# Patient Record
Sex: Male | Born: 1945 | Race: Black or African American | Hispanic: No | Marital: Single | Smoking: Former smoker
Health system: Southern US, Community
[De-identification: ages and names within clinical notes are randomized; demographics above are authoritative.]

## PROBLEM LIST (undated history)

## (undated) DIAGNOSIS — E785 Hyperlipidemia, unspecified: Secondary | ICD-10-CM

## (undated) DIAGNOSIS — G4730 Sleep apnea, unspecified: Secondary | ICD-10-CM

## (undated) DIAGNOSIS — L03119 Cellulitis of unspecified part of limb: Secondary | ICD-10-CM

## (undated) DIAGNOSIS — J9610 Chronic respiratory failure, unspecified whether with hypoxia or hypercapnia: Secondary | ICD-10-CM

## (undated) DIAGNOSIS — F17201 Nicotine dependence, unspecified, in remission: Secondary | ICD-10-CM

## (undated) DIAGNOSIS — Z9981 Dependence on supplemental oxygen: Secondary | ICD-10-CM

## (undated) DIAGNOSIS — E669 Obesity, unspecified: Secondary | ICD-10-CM

## (undated) DIAGNOSIS — E230 Hypopituitarism: Secondary | ICD-10-CM

## (undated) DIAGNOSIS — J449 Chronic obstructive pulmonary disease, unspecified: Secondary | ICD-10-CM

## (undated) DIAGNOSIS — B3781 Candidal esophagitis: Secondary | ICD-10-CM

## (undated) DIAGNOSIS — G40909 Epilepsy, unspecified, not intractable, without status epilepticus: Secondary | ICD-10-CM

## (undated) DIAGNOSIS — I5022 Chronic systolic (congestive) heart failure: Secondary | ICD-10-CM

## (undated) DIAGNOSIS — N184 Chronic kidney disease, stage 4 (severe): Secondary | ICD-10-CM

## (undated) DIAGNOSIS — K922 Gastrointestinal hemorrhage, unspecified: Secondary | ICD-10-CM

## (undated) DIAGNOSIS — I10 Essential (primary) hypertension: Secondary | ICD-10-CM

## (undated) DIAGNOSIS — E119 Type 2 diabetes mellitus without complications: Secondary | ICD-10-CM

## (undated) DIAGNOSIS — I351 Nonrheumatic aortic (valve) insufficiency: Secondary | ICD-10-CM

## (undated) DIAGNOSIS — I272 Pulmonary hypertension, unspecified: Secondary | ICD-10-CM

## (undated) DIAGNOSIS — I4510 Unspecified right bundle-branch block: Secondary | ICD-10-CM

## (undated) DIAGNOSIS — E039 Hypothyroidism, unspecified: Secondary | ICD-10-CM

## (undated) HISTORY — DX: Gastrointestinal hemorrhage, unspecified: K92.2

## (undated) HISTORY — PX: CRANIOTOMY: SHX93

## (undated) HISTORY — DX: Chronic obstructive pulmonary disease, unspecified: J44.9

## (undated) HISTORY — PX: WOUND EXPLORATION: SHX2669

## (undated) HISTORY — DX: Hypopituitarism: E23.0

## (undated) HISTORY — DX: Hyperlipidemia, unspecified: E78.5

## (undated) HISTORY — DX: Nicotine dependence, unspecified, in remission: F17.201

## (undated) HISTORY — DX: Epilepsy, unspecified, not intractable, without status epilepticus: G40.909

---

## 1998-04-09 ENCOUNTER — Ambulatory Visit (HOSPITAL_COMMUNITY): Admission: RE | Admit: 1998-04-09 | Discharge: 1998-04-09 | Payer: Self-pay | Admitting: Neurosurgery

## 1999-04-16 ENCOUNTER — Encounter: Payer: Self-pay | Admitting: Neurosurgery

## 1999-04-16 ENCOUNTER — Ambulatory Visit (HOSPITAL_COMMUNITY): Admission: RE | Admit: 1999-04-16 | Discharge: 1999-04-16 | Payer: Self-pay | Admitting: Neurosurgery

## 2001-03-13 ENCOUNTER — Encounter: Payer: Self-pay | Admitting: Neurosurgery

## 2001-03-13 ENCOUNTER — Ambulatory Visit (HOSPITAL_COMMUNITY): Admission: RE | Admit: 2001-03-13 | Discharge: 2001-03-13 | Payer: Self-pay | Admitting: Neurosurgery

## 2001-11-23 ENCOUNTER — Encounter: Payer: Self-pay | Admitting: Internal Medicine

## 2001-11-23 ENCOUNTER — Ambulatory Visit (HOSPITAL_COMMUNITY): Admission: RE | Admit: 2001-11-23 | Discharge: 2001-11-23 | Payer: Self-pay | Admitting: Internal Medicine

## 2001-11-26 ENCOUNTER — Ambulatory Visit (HOSPITAL_COMMUNITY): Admission: RE | Admit: 2001-11-26 | Discharge: 2001-11-26 | Payer: Self-pay | Admitting: Internal Medicine

## 2001-11-26 ENCOUNTER — Encounter: Payer: Self-pay | Admitting: Internal Medicine

## 2002-06-20 ENCOUNTER — Other Ambulatory Visit: Admission: RE | Admit: 2002-06-20 | Discharge: 2002-06-20 | Payer: Self-pay | Admitting: Dermatology

## 2003-04-20 ENCOUNTER — Encounter: Payer: Self-pay | Admitting: Internal Medicine

## 2003-04-20 ENCOUNTER — Ambulatory Visit (HOSPITAL_COMMUNITY): Admission: RE | Admit: 2003-04-20 | Discharge: 2003-04-20 | Payer: Self-pay | Admitting: Internal Medicine

## 2007-08-09 HISTORY — PX: COLONOSCOPY: SHX174

## 2007-08-13 ENCOUNTER — Ambulatory Visit (HOSPITAL_COMMUNITY): Admission: RE | Admit: 2007-08-13 | Discharge: 2007-08-13 | Payer: Self-pay | Admitting: Internal Medicine

## 2007-08-13 ENCOUNTER — Ambulatory Visit: Payer: Self-pay | Admitting: Internal Medicine

## 2008-02-02 ENCOUNTER — Ambulatory Visit: Payer: Self-pay | Admitting: Internal Medicine

## 2008-02-02 ENCOUNTER — Ambulatory Visit (HOSPITAL_COMMUNITY): Admission: RE | Admit: 2008-02-02 | Discharge: 2008-02-02 | Payer: Self-pay | Admitting: Internal Medicine

## 2008-12-08 HISTORY — PX: NEPHRECTOMY: SHX65

## 2008-12-28 ENCOUNTER — Ambulatory Visit (HOSPITAL_COMMUNITY): Admission: RE | Admit: 2008-12-28 | Discharge: 2008-12-28 | Payer: Self-pay | Admitting: Internal Medicine

## 2009-01-08 ENCOUNTER — Ambulatory Visit (HOSPITAL_COMMUNITY): Admission: RE | Admit: 2009-01-08 | Discharge: 2009-01-08 | Payer: Self-pay | Admitting: Urology

## 2010-05-20 ENCOUNTER — Emergency Department (HOSPITAL_COMMUNITY): Admission: EM | Admit: 2010-05-20 | Discharge: 2010-05-20 | Payer: Self-pay | Admitting: Emergency Medicine

## 2010-12-21 ENCOUNTER — Inpatient Hospital Stay (HOSPITAL_COMMUNITY)
Admission: EM | Admit: 2010-12-21 | Discharge: 2010-12-25 | Payer: Self-pay | Source: Home / Self Care | Attending: Internal Medicine | Admitting: Internal Medicine

## 2010-12-23 LAB — BASIC METABOLIC PANEL
BUN: 10 mg/dL (ref 6–23)
BUN: 17 mg/dL (ref 6–23)
CO2: 26 mEq/L (ref 19–32)
CO2: 23 mEq/L (ref 19–32)
Calcium: 8.8 mg/dL (ref 8.4–10.5)
Calcium: 8.8 mg/dL (ref 8.4–10.5)
Chloride: 103 mEq/L (ref 96–112)
Chloride: 106 mEq/L (ref 96–112)
Creatinine, Ser: 1.62 mg/dL — ABNORMAL HIGH (ref 0.4–1.5)
Creatinine, Ser: 1.58 mg/dL — ABNORMAL HIGH (ref 0.4–1.5)
GFR calc Af Amer: 52 mL/min — ABNORMAL LOW (ref >60)
GFR calc Af Amer: 53 mL/min — ABNORMAL LOW (ref >60)
GFR calc non Af Amer: 43 mL/min — ABNORMAL LOW (ref >60)
GFR calc non Af Amer: 44 mL/min — ABNORMAL LOW (ref >60)
Glucose, Bld: 112 mg/dL — ABNORMAL HIGH (ref 70–99)
Glucose, Bld: 135 mg/dL — ABNORMAL HIGH (ref 70–99)
Potassium: 4.1 mEq/L (ref 3.5–5.1)
Potassium: 5.5 mEq/L — ABNORMAL HIGH (ref 3.5–5.1)
Sodium: 137 mEq/L (ref 135–145)
Sodium: 139 mEq/L (ref 135–145)

## 2010-12-23 LAB — DIFFERENTIAL
Basophils Absolute: 0 10*3/uL (ref 0.0–0.1)
Basophils Relative: 0 % (ref 0–1)
Eosinophils Absolute: 0.5 10*3/uL (ref 0.0–0.7)
Eosinophils Relative: 7 % — ABNORMAL HIGH (ref 0–5)
Lymphocytes Relative: 14 % (ref 12–46)
Lymphs Abs: 1 10*3/uL (ref 0.7–4.0)
Monocytes Absolute: 1 10*3/uL (ref 0.1–1.0)
Monocytes Relative: 11 % (ref 3–12)
Neutro Abs: 4.9 10*3/uL (ref 1.7–7.7)
Neutrophils Relative %: 68 % (ref 43–77)

## 2010-12-23 LAB — CBC
HCT: 44.6 % (ref 39.0–52.0)
Hemoglobin: 14.7 g/dL (ref 13.0–17.0)
MCH: 29 pg (ref 26.0–34.0)
MCHC: 33 g/dL (ref 30.0–36.0)
MCV: 88 fL (ref 78.0–100.0)
Platelets: 158 10*3/uL (ref 150–400)
RBC: 5.07 MIL/uL (ref 4.22–5.81)
RDW: 15.3 % (ref 11.5–15.5)
WBC: 7 10*3/uL (ref 4.0–10.5)

## 2010-12-23 LAB — MAGNESIUM: Magnesium: 1.9 mg/dL (ref 1.5–2.5)

## 2010-12-23 LAB — TSH: TSH: 0.993 u[IU]/mL (ref 0.350–4.500)

## 2010-12-25 LAB — CBC
HCT: 43 % (ref 39.0–52.0)
Hemoglobin: 13.8 g/dL (ref 13.0–17.0)
MCH: 28.5 pg (ref 26.0–34.0)
MCHC: 32.1 g/dL (ref 30.0–36.0)
MCV: 88.8 fL (ref 78.0–100.0)
Platelets: 168 10*3/uL (ref 150–400)
RBC: 4.84 MIL/uL (ref 4.22–5.81)
RDW: 15.2 % (ref 11.5–15.5)
WBC: 12.2 10*3/uL — ABNORMAL HIGH (ref 4.0–10.5)

## 2010-12-25 LAB — DIFFERENTIAL
Basophils Absolute: 0 10*3/uL (ref 0.0–0.1)
Basophils Relative: 0 % (ref 0–1)
Eosinophils Absolute: 0 10*3/uL (ref 0.0–0.7)
Eosinophils Relative: 0 % (ref 0–5)
Lymphocytes Relative: 5 % — ABNORMAL LOW (ref 12–46)
Lymphs Abs: 0.6 10*3/uL — ABNORMAL LOW (ref 0.7–4.0)
Monocytes Absolute: 0.6 10*3/uL (ref 0.1–1.0)
Monocytes Relative: 5 % (ref 3–12)
Neutro Abs: 11 10*3/uL — ABNORMAL HIGH (ref 1.7–7.7)
Neutrophils Relative %: 90 % — ABNORMAL HIGH (ref 43–77)

## 2010-12-25 LAB — BASIC METABOLIC PANEL
BUN: 26 mg/dL — ABNORMAL HIGH (ref 6–23)
BUN: 30 mg/dL — ABNORMAL HIGH (ref 6–23)
CO2: 23 mEq/L (ref 19–32)
CO2: 23 mEq/L (ref 19–32)
Calcium: 8.5 mg/dL (ref 8.4–10.5)
Calcium: 8.8 mg/dL (ref 8.4–10.5)
Chloride: 110 mEq/L (ref 96–112)
Chloride: 109 mEq/L (ref 96–112)
Creatinine, Ser: 1.71 mg/dL — ABNORMAL HIGH (ref 0.4–1.5)
Creatinine, Ser: 1.61 mg/dL — ABNORMAL HIGH (ref 0.4–1.5)
GFR calc Af Amer: 49 mL/min — ABNORMAL LOW (ref >60)
GFR calc Af Amer: 52 mL/min — ABNORMAL LOW (ref >60)
GFR calc non Af Amer: 40 mL/min — ABNORMAL LOW (ref >60)
GFR calc non Af Amer: 43 mL/min — ABNORMAL LOW (ref >60)
Glucose, Bld: 160 mg/dL — ABNORMAL HIGH (ref 70–99)
Glucose, Bld: 100 mg/dL — ABNORMAL HIGH (ref 70–99)
Potassium: 5.1 mEq/L (ref 3.5–5.1)
Potassium: 4.9 mEq/L (ref 3.5–5.1)
Sodium: 141 mEq/L (ref 135–145)
Sodium: 138 mEq/L (ref 135–145)

## 2010-12-29 ENCOUNTER — Encounter: Payer: Self-pay | Admitting: Urology

## 2011-01-01 ENCOUNTER — Ambulatory Visit
Admission: RE | Admit: 2011-01-01 | Discharge: 2011-01-01 | Payer: Self-pay | Source: Home / Self Care | Attending: Pulmonary Disease | Admitting: Pulmonary Disease

## 2011-01-04 NOTE — Progress Notes (Signed)
  NAME:  [name] [name], [name] [name]                  ACCOUNT NO.:  [id number]  MEDICAL RECORD NO.:  [id number]          PATIENT TYPE:  INP  LOCATION:  A316                          FACILITY:  [facility]  PHYSICIAN:  Stefany Starace L. Juanetta Gosling, M.D.DATE OF BIRTH:  1946-07-14  DATE OF PROCEDURE: DATE OF DISCHARGE:                                PROGRESS NOTE   Patient of Dr. Felecia Shelling.  Ms. Milligan is apparently being discharged today.  I have discussed with the care management group, and he is going to have an outpatient sleep study.     Demba Nigh L. Juanetta Gosling, M.D.     ELH/MEDQ  D:  12/25/2010  T:  12/26/2010  Job:  [id number]  Electronically Signed by Kari Baars M.D. on 01/04/2011 10:50:54 AM

## 2011-01-04 NOTE — Progress Notes (Signed)
  NAME:  [name], [name]                  ACCOUNT NO.:  [id number]  MEDICAL RECORD NO.:  [id number]          PATIENT TYPE:  INP  LOCATION:  A316                          FACILITY:  [facility]  PHYSICIAN:  Edward L. Juanetta Gosling, M.D.DATE OF BIRTH:  1946-02-06  DATE OF PROCEDURE: DATE OF DISCHARGE:                                PROGRESS NOTE   [name] is being set up for an outpatient sleep study.  [name] admitted with bronchitis.  PHYSICAL EXAMINATION:  VITAL SIGNS:  Exam today shows his temperature is 98.4, pulse 81, respirations 20, blood pressure 162/90, O2 sats 95% on 2 liters. GENERAL:  Overall he looks better.  ASSESSMENT:  [name] improving.  PLAN:  I think he has sleep apnea and he needs a sleep study done that will be done as an outpatient.     Edward L. Juanetta Gosling, M.D.     ELH/MEDQ  D:  12/24/2010  T:  12/24/2010  Job:  [id number]  Electronically Signed by Kari Baars M.D. on 01/04/2011 10:50:49 AM

## 2011-01-04 NOTE — Progress Notes (Signed)
  NAME:  Strite, Tyquon                  ACCOUNT NO.:  [id number]  MEDICAL RECORD NO.:  [id number]          PATIENT TYPE:  INP  LOCATION:  A316                          FACILITY:  [facility]  PHYSICIAN:  Che Rachal L. Juanetta Gosling, M.D.DATE OF BIRTH:  08-04-1946  DATE OF PROCEDURE: DATE OF DISCHARGE:                                PROGRESS NOTE   The patient of Dr. Felecia Shelling.  Mr. Duchemin is I think about the same.  He still has some cough and congestion, but does not seem to be as bad.  PHYSICAL EXAMINATION:  VITAL SIGNS:  Exam shows that his temperature is 98.2, pulse 89, respirations 16, blood pressure 155/90, O2 sat 95% on 2 liters.  ASSESSMENT:  He has had an episode of bronchitis.  He has what I think is sleep apnea and he is going to be reevaluated.  He will need to have a sleep study as an outpatient.     Kourtney Terriquez L. Juanetta Gosling, M.D.     ELH/MEDQ  D:  12/23/2010  T:  12/24/2010  Job:  [id number]  Electronically Signed by Kari Baars M.D. on 01/04/2011 10:50:45 AM
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
## 2011-02-24 LAB — BASIC METABOLIC PANEL
BUN: 27 mg/dL — ABNORMAL HIGH (ref 6–23)
CO2: 29 mEq/L (ref 19–32)
Calcium: 9.4 mg/dL (ref 8.4–10.5)
Chloride: 105 mEq/L (ref 96–112)
Creatinine, Ser: 1.79 mg/dL — ABNORMAL HIGH (ref 0.4–1.5)
GFR calc Af Amer: 46 mL/min — ABNORMAL LOW (ref >60)
GFR calc non Af Amer: 38 mL/min — ABNORMAL LOW (ref >60)
Glucose, Bld: 97 mg/dL (ref 70–99)
Potassium: 4.9 mEq/L (ref 3.5–5.1)
Sodium: 138 mEq/L (ref 135–145)

## 2011-02-24 LAB — DIFFERENTIAL
Basophils Absolute: 0 10*3/uL (ref 0.0–0.1)
Basophils Relative: 0 % (ref 0–1)
Eosinophils Absolute: 0.2 10*3/uL (ref 0.0–0.7)
Eosinophils Relative: 4 % (ref 0–5)
Lymphocytes Relative: 16 % (ref 12–46)
Lymphs Abs: 1 10*3/uL (ref 0.7–4.0)
Monocytes Absolute: 0.6 10*3/uL (ref 0.1–1.0)
Monocytes Relative: 11 % (ref 3–12)
Neutro Abs: 4.1 10*3/uL (ref 1.7–7.7)
Neutrophils Relative %: 69 % (ref 43–77)

## 2011-02-24 LAB — CBC
HCT: 42.3 % (ref 39.0–52.0)
Hemoglobin: 14.1 g/dL (ref 13.0–17.0)
MCHC: 33.5 g/dL (ref 30.0–36.0)
MCV: 90.3 fL (ref 78.0–100.0)
Platelets: 147 10*3/uL — ABNORMAL LOW (ref 150–400)
RBC: 4.68 MIL/uL (ref 4.22–5.81)
RDW: 16 % — ABNORMAL HIGH (ref 11.5–15.5)
WBC: 5.9 10*3/uL (ref 4.0–10.5)

## 2011-02-24 LAB — ETHANOL: Alcohol, Ethyl (B): <5 mg/dL (ref 0–10)

## 2011-02-24 LAB — TROPONIN I: Troponin I: 0.02 ng/mL (ref 0.00–0.06)

## 2011-02-24 LAB — PHENYTOIN LEVEL, TOTAL: Phenytoin Lvl: 9.8 ug/mL — ABNORMAL LOW (ref 10.0–20.0)

## 2011-02-24 LAB — ALBUMIN: Albumin: 3.9 g/dL (ref 3.5–5.2)

## 2011-03-20 ENCOUNTER — Inpatient Hospital Stay (HOSPITAL_COMMUNITY)
Admission: EM | Admit: 2011-03-20 | Discharge: 2011-03-24 | DRG: 191 | Disposition: A | Discharge status: Home / Self Care | Payer: Medicare Other | Source: Ambulatory Visit | Attending: Internal Medicine | Admitting: Internal Medicine

## 2011-03-20 ENCOUNTER — Emergency Department (HOSPITAL_COMMUNITY): Payer: Medicare Other

## 2011-03-20 DIAGNOSIS — J441 Chronic obstructive pulmonary disease with (acute) exacerbation: Principal | ICD-10-CM | POA: Diagnosis present

## 2011-03-20 DIAGNOSIS — Z905 Acquired absence of kidney: Secondary | ICD-10-CM

## 2011-03-20 DIAGNOSIS — E785 Hyperlipidemia, unspecified: Secondary | ICD-10-CM | POA: Diagnosis present

## 2011-03-20 DIAGNOSIS — G4730 Sleep apnea, unspecified: Secondary | ICD-10-CM | POA: Diagnosis present

## 2011-03-20 DIAGNOSIS — E230 Hypopituitarism: Secondary | ICD-10-CM | POA: Diagnosis present

## 2011-03-20 DIAGNOSIS — G40909 Epilepsy, unspecified, not intractable, without status epilepticus: Secondary | ICD-10-CM | POA: Diagnosis present

## 2011-03-20 DIAGNOSIS — I10 Essential (primary) hypertension: Secondary | ICD-10-CM | POA: Diagnosis present

## 2011-03-20 LAB — CBC
MCHC: 32.1 g/dL (ref 30.0–36.0)
Platelets: 138 10*3/uL — ABNORMAL LOW (ref 150–400)
RDW: 17.2 % — ABNORMAL HIGH (ref 11.5–15.5)
WBC: 10.1 10*3/uL (ref 4.0–10.5)

## 2011-03-20 LAB — BASIC METABOLIC PANEL
BUN: 16 mg/dL (ref 6–23)
CO2: 27 mEq/L (ref 19–32)
Calcium: 9.1 mg/dL (ref 8.4–10.5)
GFR calc non Af Amer: 38 mL/min — ABNORMAL LOW (ref >60)
Glucose, Bld: 111 mg/dL — ABNORMAL HIGH (ref 70–99)

## 2011-03-20 LAB — HEPATIC FUNCTION PANEL
ALT: 32 U/L (ref 0–53)
Bilirubin, Direct: 0.1 mg/dL (ref 0.0–0.3)
Indirect Bilirubin: 0.4 mg/dL (ref 0.3–0.9)
Total Bilirubin: 0.5 mg/dL (ref 0.3–1.2)

## 2011-03-20 LAB — POCT CARDIAC MARKERS
CKMB, poc: 5.1 ng/mL (ref 1.0–8.0)
Myoglobin, poc: >500 ng/mL (ref 12–200)

## 2011-03-20 LAB — DIFFERENTIAL
Basophils Absolute: 0 10*3/uL (ref 0.0–0.1)
Basophils Relative: 0 % (ref 0–1)
Eosinophils Absolute: 1.6 10*3/uL — ABNORMAL HIGH (ref 0.0–0.7)
Eosinophils Relative: 16 % — ABNORMAL HIGH (ref 0–5)
Lymphocytes Relative: 13 % (ref 12–46)

## 2011-03-21 LAB — BLOOD GAS, ARTERIAL
Bicarbonate: 23.6 mEq/L (ref 20.0–24.0)
O2 Content: 2 L/min
O2 Saturation: 94.7 %
Patient temperature: 37
TCO2: 20.5 mmol/L (ref 0–100)
pH, Arterial: 7.377 (ref 7.350–7.450)

## 2011-03-23 LAB — BASIC METABOLIC PANEL
BUN: 24 mg/dL — ABNORMAL HIGH (ref 6–23)
Calcium: 8.9 mg/dL (ref 8.4–10.5)
Creatinine, Ser: 1.75 mg/dL — ABNORMAL HIGH (ref 0.4–1.5)
GFR calc non Af Amer: 39 mL/min — ABNORMAL LOW (ref >60)
Glucose, Bld: 103 mg/dL — ABNORMAL HIGH (ref 70–99)

## 2011-03-23 LAB — CBC
HCT: 47.2 % (ref 39.0–52.0)
MCH: 29.1 pg (ref 26.0–34.0)
MCHC: 31.4 g/dL (ref 30.0–36.0)
MCV: 92.7 fL (ref 78.0–100.0)
Platelets: 150 10*3/uL (ref 150–400)
RDW: 17.3 % — ABNORMAL HIGH (ref 11.5–15.5)
WBC: 12.4 10*3/uL — ABNORMAL HIGH (ref 4.0–10.5)

## 2011-03-23 LAB — DIFFERENTIAL
Eosinophils Absolute: 0.1 10*3/uL (ref 0.0–0.7)
Eosinophils Relative: 1 % (ref 0–5)
Lymphocytes Relative: 8 % — ABNORMAL LOW (ref 12–46)
Lymphs Abs: 0.9 10*3/uL (ref 0.7–4.0)
Monocytes Absolute: 1.1 10*3/uL — ABNORMAL HIGH (ref 0.1–1.0)

## 2011-03-24 LAB — BASIC METABOLIC PANEL
BUN: 21 mg/dL (ref 6–23)
Calcium: 8.5 mg/dL (ref 8.4–10.5)
Chloride: 104 mEq/L (ref 96–112)
Creatinine, Ser: 1.75 mg/dL — ABNORMAL HIGH (ref 0.4–1.5)
GFR calc Af Amer: 47 mL/min — ABNORMAL LOW (ref >60)
GFR calc non Af Amer: 39 mL/min — ABNORMAL LOW (ref >60)
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
## 2011-03-24 NOTE — Progress Notes (Signed)
  NAME:  [name] [name], [name]                  ACCOUNT NO.:  [id number]  MEDICAL RECORD NO.:  [id number]           PATIENT TYPE:  I  LOCATION:  A314                          FACILITY:  [facility]  PHYSICIAN:  Ragen Laver L. Juanetta Gosling, M.D.DATE OF BIRTH:  [birth year]  DATE OF PROCEDURE: DATE OF DISCHARGE:                                PROGRESS NOTE   Patient of Dr. Felecia Shelling.  [name] seems to be improving on a daily basis.  [name] much clearer. He feels better.  He has no new complaints.  His physical exam shows that he is awake and alert.  Temperature is 98.2, pulse 76, respirations 20, blood pressure 168/93, O2 sats 98% on room air.  He looks much more comfortable.  His chest is clearer.  He does not have any rhonchi or wheezes.  [name] still coughing and coughing up some sputum.  ASSESSMENT:  He has chronic obstructive pulmonary disease exacerbation. [name] much improved.  He also has sleep apnea and overall he looks much better.  He says that Dr. Felecia Shelling plans to send him home on Monday the 16th.     Leydi Winstead L. Juanetta Gosling, M.D.     ELH/MEDQ  D:  03/23/2011  T:  03/23/2011  Job:  [id number]  Electronically Signed by Kari Baars M.D. on 03/24/2011 08:29:50 AM

## 2011-04-02 NOTE — Discharge Summary (Signed)
  NAME:  [name] [name], [name] [name]                  ACCOUNT NO.:  [id number]  MEDICAL RECORD NO.:  [id number]           PATIENT TYPE:  I  LOCATION:  A314                          FACILITY:  [facility]  PHYSICIAN:  Aarya Quebedeaux D. Felecia Shelling, MD   DATE OF BIRTH:  08/19/1946  DATE OF ADMISSION:  03/20/2011 DATE OF DISCHARGE:  04/16/2012LH                              DISCHARGE SUMMARY   DISCHARGE DIAGNOSIS: 1. Acute exacerbation of chronic obstructive pulmonary disease. 2. Sleep apnea syndrome. 3. Panhypopituitarism. 4. Hypertension. 5. Hyperlipidemia. 6. Morbid obesity. 7. History of seizure disorder. 8. History of pituitary tumor resection. 9. History of right nephrectomy.  DISCHARGE MEDICATIONS: 1. Prednisolone 40 mg p.o. daily for 2 days, then 30 mg p.o. daily for     3 days, then 10 mg p.o. daily for 3 days. 2. Advair Diskus 1 puff b.i.d. 3. Claritin 10 mg daily. 4. Benicar 40 mg daily. 5. Clonidine 0.1 mg daily. 6. Desmopressin 0.2 mg b.i.d. 7. Dexamethasone 1 mg b.i.d. 8. Hydralazine 25 mg b.i.d. 9. Lasix 40 mg b.i.d. 10.Levothyroxine 112 mcg daily. 11.Phenytoin extended release 200 mg Monday and Thursday, 300 mg on     Tuesday, Wednesday, Friday, and Sunday. 12.KCl 40 mEq p.o. daily. 13.ProAir 2 puffs p.o. q.6 h. p.r.n. 14.Simvastatin 80 mg p.o. daily.  DISPOSITION:  The patient will be discharged home in stable condition.  DISCHARGE INSTRUCTIONS:  The patient will be followed in the office and also in pulmonary clinic.  Advised to continue his current medications including oral steroid.  LABS ON DISCHARGE:  BMP:  Sodium 137, potassium 4.0, chloride 104, calcium 8.5, carbon dioxide 29, glucose 118, BUN 21, creatinine 1.75.  HOSPITAL COURSE:  This is a 65 year old male patient with history of multiple medical illnesses who was admitted due to severe shortness of breath and wheezing.  He has a known case of chronic obstructive pulmonary disease who was on home oxygen and nebulizer  treatments. However, his condition continued to get worse.  The patient was admitted and was started on IV steroid, IV antibiotics, and pulmonary consult was done.  Over hospital stay, the patient gradually improved and he was discharged home to continue his treatment.     Carmon Brigandi D. Felecia Shelling, MD     TDF/MEDQ  D:  03/24/2011  T:  03/24/2011  Job:  [id number]  Electronically Signed by Avon Gully MD on 04/02/2011 08:33:39 AM

## 2011-04-02 NOTE — H&P (Signed)
NAME:  [name] [name], [name] [name]                  ACCOUNT NO.:  [id number]  MEDICAL RECORD NO.:  [id number]           PATIENT TYPE:  I  LOCATION:  A314                          FACILITY:  [facility]  PHYSICIAN:  Breeanna Galgano D. Felecia Shelling, MD   DATE OF BIRTH:  06/24/1946  DATE OF ADMISSION:  03/20/2011 DATE OF DISCHARGE:  LH                             HISTORY & PHYSICAL   CHIEF COMPLAINT:  Shortness of breath and wheezing.  HISTORY OF PRESENT ILLNESS:  This is a 65 year old male patient with history of multiple medical illnesses including chronic obstructive pulmonary disease on home oxygen, came to the office with severe shortness of breath.  The patient was found to be in moderate respiratory distress due to exacerbation of chronic obstructive pulmonary disease.  He was directly sent to emergency room where he was evaluated and treated with nebulizer treatments.  However, his symptoms did not improve.  His chest x-ray showed sign of bibasilar atelectasisand enlargement of cardiac.  There was no sign of acute pneumonia.  The patient was admitted due to severe shortness of breath and he was started on IV steroids and IV antibiotics.  REVIEW OF SYSTEMS:  The patient has cough with yellowish sputum and wheezing.  His symptoms are worse during the night.  The patient is severely short of breath.  No fever, chills, chest pain, nausea, vomiting, abdominal pain, dysuria, urgency, or frequency of urination.  PAST MEDICAL HISTORY: 1. Chronic obstructive pulmonary disease. 2. Sleep apnea syndrome. 3. Morbid obesity. 4. Hyperlipidemia. 5. Hypertension. 6. Seizure disorder. 7. 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 9. History of right nephrectomy.  CURRENT MEDICATIONS: 1. DuoNeb q.4 h p.r.n. 2. Claritin 10 mg p.o. daily. 3. Clonidine 0.1 mg t.i.d. 4. Hydralazine 25 mg b.i.d. 5. Lasix 40 mg b.i.d. 6. Benicar 40 mg daily. 7. Simvastatin 80 mg p.o. daily. 8.  Levothyroxine 112 mcg daily. 9. KCl 40 mEq daily. 10.Desmopressin 0.2 mg b.i.d. 11.Dexamethasone 1 mg p.o. b.i.d.  SOCIAL HISTORY:  The patient is currently divorced.  No history of alcohol, tobacco, or substance abuse.  FAMILY HISTORY:  There is no history of cardiac or known concern disease in the family.  PHYSICAL EXAMINATION:  GENERAL:  The patient is alert awake and acutely sick looking. VITAL SIGNS:  Blood pressure 173/87, pulse 96, respiratory rate 22, temperature 98.0 degrees Fahrenheit. HEENT:  Pupils are equal and reactive. NECK:  Supple. CHEST:  Poor air entry.  Bilateral rhonchi. CARDIOVASCULAR:  First and second heart sound heard.  No murmur, no gallop. ABDOMEN:  Soft and lax.  Bowel sound is positive.  No mass or organomegaly. EXTREMITIES:  2+ edema.  LABS ON ADMISSION:  CBC:  WBC 10.1, hemoglobin 15.3, hematocrit 47.7, and platelets 138.  Myoglobin greater than 500, troponin less than 0.05, CK-MB 5.1.  BMP:  Sodium 139, potassium 4.0, chloride 105, carbon dioxide 27, glucose 111, BUN 16, creatinine 1.7, calcium 9.1, and BNP less than 30.  ASSESSMENT: 1. Acute exacerbation of chronic obstructive pulmonary disease. 2. History of sleep apnea syndrome. 3. Panhypopituitarism. 4. Hypertension. 5. Hyperlipidemia. 6. Morbid obesity.  PLAN:  We will continue the patient on IV steroid.  Continue nebulizer treatments.  We will start empirically on IV antibiotics.  We will do Pulmonary consult.  We will continue the patient on his regular medications.     Halle Davlin D. Felecia Shelling, MD     TDF/MEDQ  D:  03/21/2011  T:  03/21/2011  Job:  [id number]  Electronically Signed by Avon Gully MD on 04/02/2011 08:33:41 AM

## 2011-04-06 NOTE — Group Therapy Note (Signed)
  NAME:  Ertl, Brandon                  ACCOUNT NO.:  [id number]  MEDICAL RECORD NO.:  [id number]           PATIENT TYPE:  LOCATION:                                 FACILITY:  PHYSICIAN:  Claude Swendsen L. Juanetta Gosling, M.D.DATE OF BIRTH:  [birth year]  DATE OF PROCEDURE: DATE OF DISCHARGE:                                PROGRESS NOTE   Mr. Michelsen is better.  He says he feels better much less short of breath than last night.  His exam shows temperature of 98.3, pulse 91, respirations 22, blood pressure 165/80, O2 sats 93% on room air.  Chest is clear.  Heart is regular.  He looks much better.  ASSESSMENT:  [name] improving.  PLAN:  Continue with treatments and medications.  No changes today.     Seymour Pavlak L. Juanetta Gosling, M.D.     ELH/MEDQ  D:  03/21/2011  T:  03/21/2011  Job:  [id number]  Electronically Signed by Kari Baars M.D. on 04/06/2011 02:42:21 PM

## 2011-04-06 NOTE — Group Therapy Note (Signed)
  NAME:  [name] [name], [name] [name]                  ACCOUNT NO.:  [id number]  MEDICAL RECORD NO.:  [id number]          PATIENT TYPE:  LOCATION:                                 FACILITY:  PHYSICIAN:  Taten Merrow L. Juanetta Gosling, M.D.DATE OF BIRTH:  10-21-1946  DATE OF PROCEDURE: DATE OF DISCHARGE:                                PROGRESS NOTE   [name] [name] is overall continues to improve.  [name] sitting up feels well with no new complaints.  [name] still coughing up some sputum.  His exam shows temperature is 97.8, pulse 72, respirations 16, blood pressure 155/80, O2 sats 93% on 2 liters.  His chest is much clearer.  He looks more comfortable.  His heart is regular.  His abdomen soft.  ASSESSMENT:  [name] better.  PLAN:  Continue his treatments medications.  No changes today.     Daylyn Azbill L. Juanetta Gosling, M.D.     ELH/MEDQ  D:  03/23/2011  T:  03/23/2011  Job:  [id number]  Electronically Signed by Kari Baars M.D. on 04/06/2011 02:42:24 PM

## 2011-04-06 NOTE — Group Therapy Note (Signed)
  NAME:  [name] [name], [name] [name]                  ACCOUNT NO.:  [id number]  MEDICAL RECORD NO.:  [id number]           PATIENT TYPE:  I  LOCATION:  A314                          FACILITY:  [facility]  PHYSICIAN:  Aubrie Lucien L. Juanetta Gosling, M.D.DATE OF BIRTH:  May 06, 1946  DATE OF PROCEDURE:  03/24/2011 DATE OF DISCHARGE:  03/24/2011                                PROGRESS NOTE   [name] is a patient of Dr. Felecia Shelling, who has COPD and sleep apnea.  He was admitted with an acute exacerbation of his COPD and he had some respiratory failure when he was admitted, but he has improved markedly over the last several days.  He wants to see if he can take some sort of an antibiotic.  Otherwise, everything is going about the same.  He has no new complaints and he is being discharged today which I think is appropriate.  I will plan to follow in my office with Dr. Letitia Neri permission.     Tyrik Stetzer L. Juanetta Gosling, M.D.     ELH/MEDQ  D:  03/25/2011  T:  03/26/2011  Job:  [id number]  Electronically Signed by Kari Baars M.D. on 04/06/2011 02:42:30 PM

## 2011-04-22 NOTE — Op Note (Signed)
NAME:  [name] [name], [name] [name]                  ACCOUNT NO.:  [id number]   MEDICAL RECORD NO.:  [id number]          PATIENT TYPE:  AMB   LOCATION:  DAY                           FACILITY:  [facility]   PHYSICIAN:  R. Roetta Sessions, M.D. DATE OF BIRTH:  September 25, 1946   DATE OF PROCEDURE:  08/13/2007  DATE OF DISCHARGE:                               OPERATIVE REPORT   PROCEDURE:  Screening colonoscopy.   INDICATIONS FOR PROCEDURE:  Patient is a 65 year old African-American  gentleman with no lower GI tract symptoms, who comes for screening  colonoscopy.  His last colonoscopy was back in 1998 by Dr. Karilyn Cota.  He  was found to have a normal colon.  This procedure has been discussed  with the patient at length.  Potential risks, benefits, and alternatives  have been reviewed.  His blood pressure was elevated prior to the  procedure.  He has a history of seizures and hypertension.   His wife took it upon herself to hold his Dilantin and blood pressure  medication regimen over the past two days during the prep.  He was given  his Dilantin and blood pressure medications with a small amount of water  just prior to the procedure.   PROCEDURE NOTE:  Oxygen saturation, blood pressure, pulse and  respirations were monitored throughout the entire procedure.   CONSCIOUS SEDATION:  Versed 2 mg IV, Demerol 50 mg IV in a single dose.   INSTRUMENTATION:  Pentax video chip system.   FINDINGS:  Digital rectal exam revealed no abnormalities.   ENDOSCOPIC FINDINGS:  Prep was good.  Colon:  Colonic mucosa was  surveyed from rectosigmoid junction through the left, transverse and  right colon to the appendiceal orifice, ileocecal valve and cecum.  These structures were well-seen and photographed for the record.  From  this level, the scope was slowly withdrawn.  All previously seen mucosal  surfaces were again seen.  Colonic mucosa appeared normal.  Scope was  pulled down to the rectum.  Retroflexed examination of the  rectal  mucosa, including retroflexed view of the anal verge, demonstrated no  abnormalities.  The patient tolerated the procedure well, was reacted in  endoscopy.   IMPRESSION:  1. Normal rectum.  2. Normal colon.   RECOMMENDATIONS:  Repeat screening colonoscopy in ten years.      Jonathon Bellows, M.D.  Electronically Signed     RMR/MEDQ  D:  08/13/2007  T:  08/13/2007  Job:  [id number]   cc:   Tesfaye D. Felecia Shelling, MD  Fax: [phone]

## 2011-12-22 ENCOUNTER — Emergency Department (HOSPITAL_COMMUNITY)
Admission: EM | Admit: 2011-12-22 | Discharge: 2011-12-22 | Disposition: A | Discharge status: Home / Self Care | Payer: Medicare Other | Attending: Emergency Medicine | Admitting: Emergency Medicine

## 2011-12-22 ENCOUNTER — Other Ambulatory Visit: Payer: Self-pay

## 2011-12-22 ENCOUNTER — Encounter (HOSPITAL_COMMUNITY): Payer: Self-pay

## 2011-12-22 ENCOUNTER — Emergency Department (HOSPITAL_COMMUNITY): Payer: Medicare Other

## 2011-12-22 DIAGNOSIS — J441 Chronic obstructive pulmonary disease with (acute) exacerbation: Secondary | ICD-10-CM

## 2011-12-22 DIAGNOSIS — R0609 Other forms of dyspnea: Secondary | ICD-10-CM | POA: Insufficient documentation

## 2011-12-22 DIAGNOSIS — R0602 Shortness of breath: Secondary | ICD-10-CM | POA: Insufficient documentation

## 2011-12-22 DIAGNOSIS — R000 Tachycardia, unspecified: Secondary | ICD-10-CM | POA: Insufficient documentation

## 2011-12-22 DIAGNOSIS — I10 Essential (primary) hypertension: Secondary | ICD-10-CM | POA: Insufficient documentation

## 2011-12-22 DIAGNOSIS — R0989 Other specified symptoms and signs involving the circulatory and respiratory systems: Secondary | ICD-10-CM | POA: Insufficient documentation

## 2011-12-22 DIAGNOSIS — Z9889 Other specified postprocedural states: Secondary | ICD-10-CM | POA: Insufficient documentation

## 2011-12-22 HISTORY — DX: Essential (primary) hypertension: I10

## 2011-12-22 LAB — BASIC METABOLIC PANEL
Calcium: 9.1 mg/dL (ref 8.4–10.5)
Chloride: 105 mEq/L (ref 96–112)
Creatinine, Ser: 2.28 mg/dL — ABNORMAL HIGH (ref 0.50–1.35)
GFR calc Af Amer: 32 mL/min — ABNORMAL LOW (ref >90)
Sodium: 137 mEq/L (ref 135–145)

## 2011-12-22 LAB — CBC
MCV: 90.4 fL (ref 78.0–100.0)
Platelets: 147 10*3/uL — ABNORMAL LOW (ref 150–400)
RBC: 5.33 MIL/uL (ref 4.22–5.81)
RDW: 16.4 % — ABNORMAL HIGH (ref 11.5–15.5)
WBC: 8.1 10*3/uL (ref 4.0–10.5)

## 2011-12-22 LAB — PRO B NATRIURETIC PEPTIDE: Pro B Natriuretic peptide (BNP): 798.2 pg/mL — ABNORMAL HIGH (ref 0–125)

## 2011-12-22 LAB — TROPONIN I: Troponin I: <0.3 ng/mL (ref <0.30)

## 2011-12-22 MED ORDER — IPRATROPIUM BROMIDE 0.02 % IN SOLN
0.5000 mg | Freq: Once | RESPIRATORY_TRACT | Status: AC
  Administered 2011-12-22: 0.5 mg via RESPIRATORY_TRACT
  Filled 2011-12-22: qty 2.5

## 2011-12-22 MED ORDER — METHYLPREDNISOLONE SODIUM SUCC 125 MG IJ SOLR
125.0000 mg | INTRAMUSCULAR | Status: AC
  Administered 2011-12-22: 125 mg via INTRAVENOUS
  Filled 2011-12-22: qty 2

## 2011-12-22 MED ORDER — AZITHROMYCIN 250 MG PO TABS: ORAL_TABLET | ORAL | Status: AC

## 2011-12-22 MED ORDER — ALBUTEROL SULFATE (5 MG/ML) 0.5% IN NEBU
2.5000 mg | INHALATION_SOLUTION | Freq: Once | RESPIRATORY_TRACT | Status: AC
  Administered 2011-12-22: 2.5 mg via RESPIRATORY_TRACT
  Filled 2011-12-22: qty 0.5

## 2011-12-22 MED ORDER — FUROSEMIDE 40 MG PO TABS
40.0000 mg | ORAL_TABLET | Freq: Once | ORAL | Status: AC
Start: 2011-12-22 — End: 2011-12-22
  Administered 2011-12-22: 40 mg via ORAL
  Filled 2011-12-22: qty 1

## 2011-12-22 MED ORDER — ALBUTEROL (5 MG/ML) CONTINUOUS INHALATION SOLN: 10.0000 mg/h | INHALATION_SOLUTION | RESPIRATORY_TRACT | Status: DC

## 2011-12-22 MED ORDER — AZITHROMYCIN 250 MG PO TABS
500.0000 mg | ORAL_TABLET | Freq: Once | ORAL | Status: AC
  Administered 2011-12-22: 500 mg via ORAL
  Filled 2011-12-22: qty 2

## 2011-12-22 MED ORDER — PREDNISONE 50 MG PO TABS: ORAL_TABLET | ORAL | Status: AC

## 2011-12-22 NOTE — ED Provider Notes (Signed)
History     CSN: [id number]  Arrival date & time 12/22/11  0303   First MD Initiated Contact with Patient 12/22/11 [phone]      Chief Complaint  Patient presents with  . Shortness of Breath    (Consider location/radiation/quality/duration/timing/severity/associated sxs/prior treatment) HPI This patient with copd now presents with shortness of breath. He notes that he recently completed a course of antibiotics. Today, approximately one hour prior to presentation he noticed that his typical dyspnea became worse, with decreased exercise capacity. He notes minimal improvement with home nebulizer treatments. He denies any pain, lightheadedness, fevers, chills, cough. Past Medical History  Diagnosis Date  . Chronic bronchitis   . Hypertension     Past Surgical History  Procedure Date  . Brain surgery   . Brain tumor excision   . Nephrectomy     History reviewed. No pertinent family history.  History  Substance Use Topics  . Smoking status: Former Smoker    Types: Cigarettes  . Smokeless tobacco: Not on file  . Alcohol Use: No      Review of Systems  Constitutional:       Per HPI, otherwise negative  HENT:       Per HPI, otherwise negative  Eyes: Negative.   Respiratory:       Per HPI, otherwise negative  Cardiovascular:       Per HPI, otherwise negative  Gastrointestinal: Negative for vomiting.  Genitourinary: Negative.   Musculoskeletal:       Per HPI, otherwise negative  Skin: Negative.   Neurological: Negative for syncope.    Allergies  Review of patient's allergies indicates no known allergies.  Home Medications  No current outpatient prescriptions on file.  BP 168/92  Pulse 104  Temp 98.5 F (36.9 C)  Resp 24  SpO2 97%  Physical Exam  Nursing note and vitals reviewed. Constitutional: [name] oriented to person, place, and time. He appears well-developed. No distress.  HENT:  Head: Normocephalic and atraumatic.  Eyes: Conjunctivae and EOM are  normal.  Cardiovascular: Regular rhythm.  Tachycardia present.   Pulmonary/Chest: Effort normal. No stridor. No respiratory distress. He has wheezes.  Abdominal: He exhibits no distension.  Musculoskeletal: He exhibits no edema.  Neurological: [name] alert and oriented to person, place, and time.  Skin: Skin is warm and dry.  Psychiatric: He has a normal mood and affect.    ED Course  Procedures (including critical care time)  Labs Reviewed  CBC - Abnormal; Notable for the following:    RDW 16.4 (*)    Platelets 147 (*)    All other components within normal limits  BASIC METABOLIC PANEL - Abnormal; Notable for the following:    Glucose, Bld 108 (*)    BUN 29 (*)    Creatinine, Ser 2.28 (*)    GFR calc non Af Amer 28 (*)    GFR calc Af Amer 32 (*)    All other components within normal limits  TROPONIN I  PRO B NATRIURETIC PEPTIDE   Dg Chest Portable 1 View  12/22/2011  *RADIOLOGY REPORT*  Clinical Data: Shortness of breath and weakness.  PORTABLE CHEST - 1 VIEW  Comparison: Chest radiograph performed 03/20/2011  Findings: The lungs are well-aerated.  Mild bibasilar opacities likely reflect atelectasis.  There is no evidence of pleural effusion or pneumothorax.  The cardiomediastinal silhouette is enlarged.  No acute osseous abnormalities are seen.  IMPRESSION: Mild bibasilar airspace opacities likely reflect atelectasis; cardiomegaly again noted.  Original Report Authenticated By: Tonia Ghent, M.D.   Chest x-ray are reviewed by me, Pulse ox 99%, on Ventimask, abnormal Cardiac regular 110 sinus tach, abnormal  Date: 12/22/2011  Rate: 106  Rhythm: sinus tachycardia  QRS Axis: left  Intervals: normal  ST/T Wave abnormalities: normal  Conduction Disutrbances:right bundle branch block  Narrative Interpretation:   Old EKG Reviewed: unchanged ABNORMAL ECG   No diagnosis found.   6:14 AM Patient significantly more comfortable appearing. MDM  This 66 year old male with COPD  now presents with dyspnea. The patient was initially dyspneic but following multiple nebulizer treatments, steroids the patient was much more comfortable, with no visible dyspnea and with decreased wheezing bilaterally. The patient's labs are reassuring, chest x-ray suggestive of COPD. Low suspicion of pneumonia. The patient's ECG was nonischemic though with his cardiomegaly, the patient was advised to continue evaluation of his dyspnea with echocardiogram via his primary care physician and pulmonologist. Given the improvement in his condition, his history of COPD this presentation is most consistent with a COPD exacerbation. The patient was discharged in stable condition.        Gerhard Munch, MD 12/22/11 [phone]

## 2011-12-22 NOTE — ED Notes (Signed)
Diagnosed with bronchitis; started getting worse after finishing his medications. Breathing started getting worse yesterday afternoon.

## 2012-04-07 HISTORY — PX: TRANSPHENOIDAL PITUITARY RESECTION: SHX2572

## 2012-06-01 ENCOUNTER — Other Ambulatory Visit (HOSPITAL_COMMUNITY): Payer: Self-pay | Admitting: Internal Medicine

## 2012-06-01 ENCOUNTER — Ambulatory Visit (HOSPITAL_COMMUNITY)
Admission: RE | Admit: 2012-06-01 | Discharge: 2012-06-01 | Disposition: A | Discharge status: Home / Self Care | Payer: Medicare Other | Source: Ambulatory Visit | Attending: Internal Medicine | Admitting: Internal Medicine

## 2012-06-01 DIAGNOSIS — J449 Chronic obstructive pulmonary disease, unspecified: Secondary | ICD-10-CM | POA: Insufficient documentation

## 2012-06-01 DIAGNOSIS — J4489 Other specified chronic obstructive pulmonary disease: Secondary | ICD-10-CM | POA: Insufficient documentation

## 2012-06-01 DIAGNOSIS — Z87891 Personal history of nicotine dependence: Secondary | ICD-10-CM | POA: Insufficient documentation

## 2012-09-07 ENCOUNTER — Emergency Department (HOSPITAL_COMMUNITY): Payer: Medicare Other

## 2012-09-07 ENCOUNTER — Encounter (HOSPITAL_COMMUNITY): Payer: Self-pay

## 2012-09-07 ENCOUNTER — Inpatient Hospital Stay (HOSPITAL_COMMUNITY)
Admission: EM | Admit: 2012-09-07 | Discharge: 2012-09-15 | DRG: 602 | Disposition: A | Discharge status: Skilled Nursing Facility | Payer: Medicare Other | Attending: Internal Medicine | Admitting: Internal Medicine

## 2012-09-07 DIAGNOSIS — E669 Obesity, unspecified: Secondary | ICD-10-CM | POA: Diagnosis present

## 2012-09-07 DIAGNOSIS — E236 Other disorders of pituitary gland: Secondary | ICD-10-CM | POA: Diagnosis present

## 2012-09-07 DIAGNOSIS — L0390 Cellulitis, unspecified: Secondary | ICD-10-CM

## 2012-09-07 DIAGNOSIS — N281 Cyst of kidney, acquired: Secondary | ICD-10-CM | POA: Diagnosis present

## 2012-09-07 DIAGNOSIS — E893 Postprocedural hypopituitarism: Secondary | ICD-10-CM | POA: Diagnosis present

## 2012-09-07 DIAGNOSIS — Z85528 Personal history of other malignant neoplasm of kidney: Secondary | ICD-10-CM

## 2012-09-07 DIAGNOSIS — R609 Edema, unspecified: Secondary | ICD-10-CM | POA: Diagnosis present

## 2012-09-07 DIAGNOSIS — Z87891 Personal history of nicotine dependence: Secondary | ICD-10-CM

## 2012-09-07 DIAGNOSIS — G40909 Epilepsy, unspecified, not intractable, without status epilepticus: Secondary | ICD-10-CM | POA: Diagnosis present

## 2012-09-07 DIAGNOSIS — D649 Anemia, unspecified: Secondary | ICD-10-CM | POA: Diagnosis present

## 2012-09-07 DIAGNOSIS — N2581 Secondary hyperparathyroidism of renal origin: Secondary | ICD-10-CM | POA: Diagnosis present

## 2012-09-07 DIAGNOSIS — Z79899 Other long term (current) drug therapy: Secondary | ICD-10-CM

## 2012-09-07 DIAGNOSIS — I509 Heart failure, unspecified: Secondary | ICD-10-CM | POA: Diagnosis present

## 2012-09-07 DIAGNOSIS — Z86011 Personal history of benign neoplasm of the brain: Secondary | ICD-10-CM

## 2012-09-07 DIAGNOSIS — L97909 Non-pressure chronic ulcer of unspecified part of unspecified lower leg with unspecified severity: Secondary | ICD-10-CM | POA: Diagnosis present

## 2012-09-07 DIAGNOSIS — Y836 Removal of other organ (partial) (total) as the cause of abnormal reaction of the patient, or of later complication, without mention of misadventure at the time of the procedure: Secondary | ICD-10-CM | POA: Diagnosis present

## 2012-09-07 DIAGNOSIS — Y92009 Unspecified place in unspecified non-institutional (private) residence as the place of occurrence of the external cause: Secondary | ICD-10-CM

## 2012-09-07 DIAGNOSIS — N183 Chronic kidney disease, stage 3 unspecified: Secondary | ICD-10-CM | POA: Diagnosis present

## 2012-09-07 DIAGNOSIS — E039 Hypothyroidism, unspecified: Secondary | ICD-10-CM | POA: Diagnosis present

## 2012-09-07 DIAGNOSIS — Z905 Acquired absence of kidney: Secondary | ICD-10-CM

## 2012-09-07 DIAGNOSIS — I129 Hypertensive chronic kidney disease with stage 1 through stage 4 chronic kidney disease, or unspecified chronic kidney disease: Secondary | ICD-10-CM | POA: Diagnosis present

## 2012-09-07 DIAGNOSIS — Z9981 Dependence on supplemental oxygen: Secondary | ICD-10-CM

## 2012-09-07 DIAGNOSIS — J449 Chronic obstructive pulmonary disease, unspecified: Secondary | ICD-10-CM | POA: Diagnosis present

## 2012-09-07 DIAGNOSIS — L02419 Cutaneous abscess of limb, unspecified: Principal | ICD-10-CM | POA: Diagnosis present

## 2012-09-07 DIAGNOSIS — J4489 Other specified chronic obstructive pulmonary disease: Secondary | ICD-10-CM | POA: Diagnosis present

## 2012-09-07 DIAGNOSIS — I872 Venous insufficiency (chronic) (peripheral): Secondary | ICD-10-CM | POA: Diagnosis present

## 2012-09-07 DIAGNOSIS — J9610 Chronic respiratory failure, unspecified whether with hypoxia or hypercapnia: Secondary | ICD-10-CM | POA: Diagnosis present

## 2012-09-07 DIAGNOSIS — N170 Acute kidney failure with tubular necrosis: Secondary | ICD-10-CM | POA: Diagnosis present

## 2012-09-07 LAB — CBC WITH DIFFERENTIAL/PLATELET
Basophils Absolute: 0 10*3/uL (ref 0.0–0.1)
Basophils Relative: 0 % (ref 0–1)
Eosinophils Absolute: 0.2 10*3/uL (ref 0.0–0.7)
Hemoglobin: 15.6 g/dL (ref 13.0–17.0)
MCH: 30.1 pg (ref 26.0–34.0)
MCHC: 31.7 g/dL (ref 30.0–36.0)
Monocytes Absolute: 1 10*3/uL (ref 0.1–1.0)
Monocytes Relative: 9 % (ref 3–12)
Neutro Abs: 9.3 10*3/uL — ABNORMAL HIGH (ref 1.7–7.7)
Neutrophils Relative %: 81 % — ABNORMAL HIGH (ref 43–77)
RDW: 16.5 % — ABNORMAL HIGH (ref 11.5–15.5)

## 2012-09-07 LAB — COMPREHENSIVE METABOLIC PANEL
AST: 24 U/L (ref 0–37)
Albumin: 3 g/dL — ABNORMAL LOW (ref 3.5–5.2)
BUN: 49 mg/dL — ABNORMAL HIGH (ref 6–23)
Chloride: 107 mEq/L (ref 96–112)
Creatinine, Ser: 2.5 mg/dL — ABNORMAL HIGH (ref 0.50–1.35)
Potassium: 4.4 mEq/L (ref 3.5–5.1)
Total Protein: 5.8 g/dL — ABNORMAL LOW (ref 6.0–8.3)

## 2012-09-07 LAB — PRO B NATRIURETIC PEPTIDE: Pro B Natriuretic peptide (BNP): 3544 pg/mL — ABNORMAL HIGH (ref 0–125)

## 2012-09-07 LAB — TROPONIN I: Troponin I: <0.3 ng/mL (ref <0.30)

## 2012-09-07 MED ORDER — VANCOMYCIN HCL IN DEXTROSE 1-5 GM/200ML-% IV SOLN
1000.0000 mg | Freq: Once | INTRAVENOUS | Status: AC
  Administered 2012-09-07: 1000 mg via INTRAVENOUS
  Filled 2012-09-07: qty 200

## 2012-09-07 MED ORDER — FUROSEMIDE 10 MG/ML IJ SOLN
40.0000 mg | Freq: Once | INTRAMUSCULAR | Status: AC
  Administered 2012-09-07: 40 mg via INTRAVENOUS
  Filled 2012-09-07: qty 4

## 2012-09-07 NOTE — ED Provider Notes (Cosign Needed)
History  This chart was scribed for [name], MD by Ladona Ridgel Day. This patient was seen in room APA07/APA07 and the patient's care was started at 1012.   CSN: [id number]  Arrival date & time 09/07/12  1012   First MD Initiated Contact with Patient 09/07/12 1158      Chief Complaint  Patient presents with  . Leg Swelling   Patient is a 66 y.o. male presenting with rash. The history is provided by the patient. No language interpreter was used.  Rash  This is a new problem. The current episode started more than 2 days ago. The problem has been gradually worsening. The problem is associated with nothing. There has been no fever. The rash is present on the left lower leg. The patient is experiencing no pain. Associated symptoms include blisters. He has tried nothing for the symptoms.  [name] [name] is a 66 y.o. male who presents to the Emergency Department complaining of constant gradually worsening non-painful red rash/blistering on his LLE for the past 4 days with associated chronic BLE swelling. He states the redness started 4 days ago along with draining of the blister on LLE. He was sent here from Dr. Letitia Neri this AM for evaluation.   Dr. Felecia Shelling sent this AM  Past Medical History  Diagnosis Date  . Chronic bronchitis   . Hypertension     Past Surgical History  Procedure Date  . Brain surgery   . Brain tumor excision   . Nephrectomy     No family history on file.  History  Substance Use Topics  . Smoking status: Former Smoker    Types: Cigarettes  . Smokeless tobacco: Not on file  . Alcohol Use: No      Review of Systems  Constitutional: Negative for fatigue.  HENT: Negative for congestion, sinus pressure and ear discharge.   Eyes: Negative for discharge.  Respiratory: Positive for shortness of breath. Negative for cough.   Cardiovascular: Positive for leg swelling. Negative for chest pain.  Gastrointestinal: Negative for abdominal pain and diarrhea.    Genitourinary: Negative for frequency and hematuria.  Musculoskeletal: Negative for back pain.  Skin: Positive for rash (LLE red blistering rash with swelling).  Neurological: Negative for seizures and headaches.  Hematological: Negative.   Psychiatric/Behavioral: Negative for hallucinations.  All other systems reviewed and are negative.    Allergies  Review of patient's allergies indicates no known allergies.  Home Medications   Current Outpatient Rx  Name Route Sig Dispense Refill  . ALBUTEROL SULFATE HFA 108 (90 BASE) MCG/ACT IN AERS Inhalation Inhale 2 puffs into the lungs every 6 (six) hours as needed. Bronchitis    . DESMOPRESSIN ACETATE 0.2 MG PO TABS Oral Take 0.2 mg by mouth 2 (two) times daily.    Marland Kitchen DEXAMETHASONE 0.5 MG PO TABS Oral Take 0.5 mg by mouth daily.    Marland Kitchen HYDRALAZINE HCL 25 MG PO TABS Oral Take 25 mg by mouth 2 (two) times daily.    Marland Kitchen LEVOTHYROXINE SODIUM 150 MCG PO TABS Oral Take 150 mcg by mouth daily.    Marland Kitchen OLMESARTAN MEDOXOMIL 40 MG PO TABS Oral Take 40 mg by mouth daily.    Marland Kitchen PHENYTOIN SODIUM EXTENDED 100 MG PO CAPS Oral Take 200-300 mg by mouth daily. Takes 20 mg on Monday and Thursdsay. On all other days of the week takes 300 mg.    . SIMVASTATIN 80 MG PO TABS Oral Take 80 mg by mouth at bedtime.  Triage Vitals: BP 154/73  Pulse 88  Temp 98.7 F (37.1 C) (Oral)  Resp 22  Ht 6\' 1"  (1.854 m)  Wt 280 lb (127.007 kg)  BMI 36.94 kg/m2  SpO2 97%  Physical Exam  Nursing note and vitals reviewed. Constitutional: [name] oriented to person, place, and time. He appears well-developed.  HENT:  Head: Normocephalic and atraumatic.  Eyes: Conjunctivae normal and EOM are normal. No scleral icterus.  Neck: Neck supple. No thyromegaly present.  Cardiovascular: Normal rate, regular rhythm and normal heart sounds.  Exam reveals no gallop and no friction rub.   No murmur heard. Pulmonary/Chest: No stridor. He has no wheezes. He has no rales. He exhibits no  tenderness.  Abdominal: He exhibits no distension. There is no tenderness. There is no rebound.  Musculoskeletal: Normal range of motion. He exhibits no edema.  Lymphadenopathy:    He has no cervical adenopathy.  Neurological: [name] oriented to person, place, and time. Coordination normal.  Skin: Skin is warm. Rash noted. There is erythema.       3+ edema from knees to ankles bilaterally.  Erythematous rash, tender LLE  Psychiatric: He has a normal mood and affect. His behavior is normal.    ED Course  Procedures (including critical care time) DIAGNOSTIC STUDIES: Oxygen Saturation is 97% on room air, normal by my interpretation.    COORDINATION OF CARE: At 1210 PM Discussed treatment plan with patient which includes CXR, lasix, blood work, and heart markers. Patient agrees.   Labs Reviewed - No data to display No results found.   No diagnosis found.    MDM  The chart was scribed for me under my direct supervision.  I personally performed the history, physical, and medical decision making and all procedures in the evaluation of this patient.[name], MD 09/07/12 [phone]

## 2012-09-07 NOTE — ED Notes (Signed)
Pt c/o swelling to bilateral legs.  Reports went to Dr. Letitia Neri office because has wound to top of left leg since Thursday.  Pt was sent to ED.  Pt's legs red and weeping.

## 2012-09-07 NOTE — ED Notes (Signed)
Beeped Dr. Fanta to [phone]. 

## 2012-09-08 DIAGNOSIS — I359 Nonrheumatic aortic valve disorder, unspecified: Secondary | ICD-10-CM

## 2012-09-08 LAB — COMPREHENSIVE METABOLIC PANEL
CO2: 27 mEq/L (ref 19–32)
Calcium: 9 mg/dL (ref 8.4–10.5)
Creatinine, Ser: 2.25 mg/dL — ABNORMAL HIGH (ref 0.50–1.35)
GFR calc Af Amer: 33 mL/min — ABNORMAL LOW (ref >90)
GFR calc non Af Amer: 28 mL/min — ABNORMAL LOW (ref >90)
Glucose, Bld: 116 mg/dL — ABNORMAL HIGH (ref 70–99)

## 2012-09-08 LAB — CREATININE, URINE, RANDOM: Creatinine, Urine: 30.64 mg/dL

## 2012-09-08 LAB — PROTEIN, URINE, RANDOM: Total Protein, Urine: 29 mg/dL

## 2012-09-08 MED ORDER — HYDRALAZINE HCL 25 MG PO TABS
25.0000 mg | ORAL_TABLET | Freq: Two times a day (BID) | ORAL | Status: DC
  Administered 2012-09-08 – 2012-09-15 (×15): 25 mg via ORAL
  Filled 2012-09-08 (×15): qty 1

## 2012-09-08 MED ORDER — ATORVASTATIN CALCIUM 40 MG PO TABS
40.0000 mg | ORAL_TABLET | Freq: Every day | ORAL | Status: DC
  Administered 2012-09-08 – 2012-09-14 (×8): 40 mg via ORAL
  Filled 2012-09-08 (×8): qty 1

## 2012-09-08 MED ORDER — IRBESARTAN 75 MG PO TABS
75.0000 mg | ORAL_TABLET | Freq: Every day | ORAL | Status: DC
  Administered 2012-09-08: 75 mg via ORAL
  Filled 2012-09-08: qty 1

## 2012-09-08 MED ORDER — DESMOPRESSIN ACETATE 0.2 MG PO TABS
0.2000 mg | ORAL_TABLET | Freq: Two times a day (BID) | ORAL | Status: DC
  Administered 2012-09-08 – 2012-09-15 (×15): 0.2 mg via ORAL
  Filled 2012-09-08 (×19): qty 1

## 2012-09-08 MED ORDER — ENOXAPARIN SODIUM 60 MG/0.6ML ~~LOC~~ SOLN
60.0000 mg | SUBCUTANEOUS | Status: DC
  Administered 2012-09-08 – 2012-09-15 (×8): 60 mg via SUBCUTANEOUS
  Filled 2012-09-08 (×8): qty 0.6

## 2012-09-08 MED ORDER — SODIUM CHLORIDE 0.9 % IJ SOLN
INTRAMUSCULAR | Status: AC
  Administered 2012-09-08: 10 mL
  Filled 2012-09-08: qty 6

## 2012-09-08 MED ORDER — DIPHENHYDRAMINE HCL 25 MG PO CAPS
25.0000 mg | ORAL_CAPSULE | Freq: Four times a day (QID) | ORAL | Status: DC | PRN
  Administered 2012-09-08: 25 mg via ORAL
  Filled 2012-09-08: qty 1

## 2012-09-08 MED ORDER — ALBUTEROL SULFATE HFA 108 (90 BASE) MCG/ACT IN AERS
2.0000 | INHALATION_SPRAY | RESPIRATORY_TRACT | Status: DC
  Administered 2012-09-08: 2 via RESPIRATORY_TRACT
  Filled 2012-09-08: qty 6.7

## 2012-09-08 MED ORDER — FUROSEMIDE 10 MG/ML IJ SOLN
60.0000 mg | Freq: Two times a day (BID) | INTRAMUSCULAR | Status: DC
  Administered 2012-09-08: 60 mg via INTRAVENOUS
  Filled 2012-09-08 (×2): qty 6

## 2012-09-08 MED ORDER — ALBUTEROL SULFATE HFA 108 (90 BASE) MCG/ACT IN AERS
2.0000 | INHALATION_SPRAY | Freq: Three times a day (TID) | RESPIRATORY_TRACT | Status: DC
  Administered 2012-09-08 – 2012-09-14 (×17): 2 via RESPIRATORY_TRACT

## 2012-09-08 MED ORDER — HYDROCODONE-ACETAMINOPHEN 5-325 MG PO TABS
1.0000 | ORAL_TABLET | Freq: Four times a day (QID) | ORAL | Status: DC | PRN
  Administered 2012-09-08 – 2012-09-14 (×10): 1 via ORAL
  Filled 2012-09-08 (×10): qty 1

## 2012-09-08 MED ORDER — PHENYTOIN SODIUM EXTENDED 100 MG PO CAPS
300.0000 mg | ORAL_CAPSULE | ORAL | Status: DC
  Administered 2012-09-08 – 2012-09-15 (×6): 300 mg via ORAL
  Filled 2012-09-08: qty 3
  Filled 2012-09-08: qty 1
  Filled 2012-09-08: qty 3
  Filled 2012-09-08 (×2): qty 1
  Filled 2012-09-08: qty 3

## 2012-09-08 MED ORDER — VANCOMYCIN HCL 1000 MG IV SOLR
2000.0000 mg | INTRAVENOUS | Status: DC
  Administered 2012-09-08 – 2012-09-15 (×8): 2000 mg via INTRAVENOUS
  Filled 2012-09-08 (×9): qty 2000

## 2012-09-08 MED ORDER — PHENYTOIN SODIUM EXTENDED 100 MG PO CAPS
200.0000 mg | ORAL_CAPSULE | ORAL | Status: DC
  Administered 2012-09-09 – 2012-09-13 (×2): 200 mg via ORAL
  Filled 2012-09-08 (×5): qty 2

## 2012-09-08 MED ORDER — ALBUTEROL SULFATE HFA 108 (90 BASE) MCG/ACT IN AERS
2.0000 | INHALATION_SPRAY | RESPIRATORY_TRACT | Status: DC | PRN
  Administered 2012-09-09: 2 via RESPIRATORY_TRACT

## 2012-09-08 MED ORDER — PHENYTOIN SODIUM EXTENDED 100 MG PO CAPS: 200.0000 mg | ORAL_CAPSULE | Freq: Every day | ORAL | Status: DC

## 2012-09-08 MED ORDER — FUROSEMIDE 10 MG/ML IJ SOLN
80.0000 mg | Freq: Two times a day (BID) | INTRAMUSCULAR | Status: DC
  Administered 2012-09-08 – 2012-09-09 (×3): 80 mg via INTRAVENOUS
  Filled 2012-09-08 (×3): qty 8

## 2012-09-08 MED ORDER — DEXAMETHASONE 0.5 MG PO TABS
0.5000 mg | ORAL_TABLET | Freq: Every day | ORAL | Status: DC
  Administered 2012-09-08 – 2012-09-15 (×8): 0.5 mg via ORAL
  Filled 2012-09-08 (×10): qty 1

## 2012-09-08 MED ORDER — LEVOTHYROXINE SODIUM 75 MCG PO TABS
150.0000 ug | ORAL_TABLET | Freq: Every day | ORAL | Status: DC
  Administered 2012-09-08 – 2012-09-15 (×8): 150 ug via ORAL
  Filled 2012-09-08 (×8): qty 2

## 2012-09-08 NOTE — Progress Notes (Signed)
*  PRELIMINARY RESULTS* Echocardiogram 2D Echocardiogram has been performed.  [name] 09/08/2012, 2:29 PM

## 2012-09-08 NOTE — Progress Notes (Signed)
ANTICOAGULATION CONSULT NOTE - Initial Consult  Pharmacy Consult for Lovenox Indication: VTE prophylaxis  No Known Allergies  Patient Measurements: Height: 6\' 1"  (185.4 cm) Weight: 302 lb 4.8 oz (137.122 kg) IBW/kg (Calculated) : 79.9   Vital Signs: Temp: 98.4 F (36.9 C) (10/02 0556) Temp src: Oral (10/02 0556) BP: 164/78 mmHg (10/02 0556) Pulse Rate: 97  (10/02 0556)  Labs:  Basename 09/07/12 1227  HGB 15.6  HCT 49.2  PLT 159  APTT --  LABPROT --  INR --  HEPARINUNFRC --  CREATININE 2.50*  CKTOTAL --  CKMB --  TROPONINI <0.30    Estimated Creatinine Clearance: 41.7 ml/min (by C-G formula based on Cr of 2.5).   Medical History: Past Medical History  Diagnosis Date  . Chronic bronchitis   . Hypertension     Medications:  Scheduled:    . albuterol  2 puff Inhalation Q4H  . atorvastatin  40 mg Oral q1800  . desmopressin  0.2 mg Oral BID  . dexamethasone  0.5 mg Oral Daily  . enoxaparin (LOVENOX) injection  60 mg Subcutaneous Q24H  . furosemide  40 mg Intravenous Once  . furosemide  60 mg Intravenous Q12H  . hydrALAZINE  25 mg Oral BID  . irbesartan  75 mg Oral Daily  . levothyroxine  150 mcg Oral Daily  . phenytoin  200 mg Oral Custom   And  . phenytoin  300 mg Oral Custom  . vancomycin  2,000 mg Intravenous Q24H  . vancomycin  1,000 mg Intravenous Once  . DISCONTD: phenytoin  200-300 mg Oral Daily    Assessment: 66 yo obese M admitted with HF exacerbation to start on Lovenox for VTE px while hospitalized.  No bleeding noted. Platelet count is low but at patient's baseline.   Goal of Therapy:  Monitor platelets by anticoagulation protocol: Yes   Plan:  Lovenox 60mg  sq Q24h (~0.5mg /kg/day) Monitor CBC  Amariona Rathje, [facility] 09/08/2012,8:22 AM

## 2012-09-08 NOTE — Consult Note (Signed)
[name] [name] MRN: [id number] DOB/AGE: Jul 18, 1946 66 y.o. Primary Care Physician:FANTA,TESFAYE, MD Admit date: 09/07/2012 Chief Complaint:  Chief Complaint  Patient presents with  . Leg Swelling   HPI: Pt is a 66 years old male  with  Past Medical hx of HTN , CHF who presented to pcp office with  due bilateral leg edema and ulcerations of the legs. HPI dates back to last Thursday  when pt noticed  worsening bilateral leg edema with blisters and weeping.  Pt went to see his PCP - who suggested to go to ER.  IN ER pt was  evaluated and admitted as case of cellulitis of the legs and CHF.  Pt offers NO c/o of  Fever,chills. NO c/o chest pain. NO c/o  nausea,vomiting, abdominal pain. NO c/o dysuria, urgency or frequency of urination.  Pt says his abdominal distension is better than yesterday.   Past Medical History  Diagnosis Date  . Chronic bronchitis   . Hypertension     COPD On oxygen    Sleep Apnea syndrome    Morbid hypothyroidism    Panhypopituitarism    [name]/p pituitary tumor resection  [name]/P Nephrectomy   Family hx NO hx of ESRD   Social History:  reports that he has quit smoking. His smoking use included Cigarettes. He does not have any smokeless tobacco history on file. He reports that he does not drink alcohol or use illicit drugs.   Allergies: No Known Allergies  Medications Prior to Admission  Medication Sig Dispense Refill  . albuterol (PROVENTIL HFA;VENTOLIN HFA) 108 (90 BASE) MCG/ACT inhaler Inhale 2 puffs into the lungs every 6 (six) hours as needed. Bronchitis      . desmopressin (DDAVP) 0.2 MG tablet Take 0.2 mg by mouth 2 (two) times daily.      Marland Kitchen dexamethasone (DECADRON) 0.5 MG tablet Take 0.5 mg by mouth daily.      . hydrALAZINE (APRESOLINE) 25 MG tablet Take 25 mg by mouth 2 (two) times daily.      Marland Kitchen levothyroxine (SYNTHROID, LEVOTHROID) 150 MCG tablet Take 150 mcg by mouth daily.      Marland Kitchen olmesartan (BENICAR) 40 MG tablet Take 40 mg by mouth daily.      .  phenytoin (DILANTIN) 100 MG ER capsule Take 200-300 mg by mouth daily. Takes 20 mg on Monday and Thursdsay. On all other days of the week takes 300 mg.      . simvastatin (ZOCOR) 80 MG tablet Take 80 mg by mouth at bedtime.           WUJ:WJXBJ from the symptoms mentioned above,there are no other symptoms referable to all systems reviewed.     Marland Kitchen albuterol  2 puff Inhalation TID  . atorvastatin  40 mg Oral q1800  . desmopressin  0.2 mg Oral BID  . dexamethasone  0.5 mg Oral Daily  . enoxaparin (LOVENOX) injection  60 mg Subcutaneous Q24H  . furosemide  60 mg Intravenous Q12H  . hydrALAZINE  25 mg Oral BID  . irbesartan  75 mg Oral Daily  . levothyroxine  150 mcg Oral Daily  . phenytoin  200 mg Oral Custom   And  . phenytoin  300 mg Oral Custom  . vancomycin  2,000 mg Intravenous Q24H  . vancomycin  1,000 mg Intravenous Once  . DISCONTD: albuterol  2 puff Inhalation Q4H  . DISCONTD: phenytoin  200-300 mg Oral Daily    Physical Exam: Vital signs in last 24 hours: Temp:  [  98.4 F (36.9 C)-99.2 F (37.3 C)] 98.4 F (36.9 C) (10/02 0556) Pulse Rate:  [87-98] 98  (10/02 1102) Resp:  [18-22] 20  (10/02 1102) BP: (137-164)/(63-78) 164/78 mmHg (10/02 0556) SpO2:  [97 %-99 %] 98 % (10/02 1102) Weight:  [302 lb 4.8 oz (137.122 kg)] 302 lb 4.8 oz (137.122 kg) (10/01 1641) Weight change:  Last BM Date: 09/08/12  Intake/Output from previous day: 10/01 0701 - 10/02 0700 In: -  Out: 2525 [Urine:2525] Total I/O In: 240 [P.O.:240] Out: 850 [Urine:850]   Physical Exam: General- pt is awake,alert, oriented to time place and person Resp- No acute REsp distress, CTA B/L NO Rhonchi CVS- S1S2 regular ij rate and rhythm GIT- BS+, soft, NT, ND EXT- 2+ LE Edema, Cyanosis, left erythematous ulceration + CNS- CN 2-12 grossly intact. Moving all 4 extremities Psych- normal mood and affect    Lab Results: CBC  Basename 09/07/12 1227  WBC 11.5*  HGB 15.6  HCT 49.2  PLT 159     BMET  Basename 09/08/12 0833 09/07/12 1227  NA 140 141  K 4.8 4.4  CL 108 107  CO2 27 26  GLUCOSE 116* 80  BUN 43* 49*  CREATININE 2.25* 2.50*  CALCIUM 9.0 9.0   Creat 2013 2.5==>2.2 2012 1.5--1.7 2011  1.79      Lab Results  Component Value Date   CALCIUM 9.0 09/08/2012      Impression: 1)Renal  AKI secondary to Prerenal / ATN vs CKD Progression                CKD stage 3.               CKD since 2011 ( [place] before that)               CKD secondary to HTN/ Low Glomerular mass( [name]/p nephrectomy)                Progression of CKD slow                Proteinura will check               2)HTN Target Organ damage  CKD Medication- On RAS blockers. On Diuretics. .On Vasodilators.  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  4)CKD Mineral-Bone Disorder- PTH not avail . Secondary Hyperparathyroidism w/u pending. Phosphorus w/u pending Vitamin 25-OH will check.   5)REsp- COPd stable  6)FEN  Normokalemic NOrmonatremic   7)Acid base Co2 at goal   8) ID Cellulitis  ON ABX   Plan:  Will d/c arb Will increase Lasix 60==>80 Will ask for CKD-BMD labs Will follow Bmet Please follow Vanco levels Agree with Echo  Will ask for Urine spot prote/ Crea ratio     [name] [name] 09/08/2012, 2:03 PM

## 2012-09-08 NOTE — H&P (Signed)
[name] [name] MRN: [id number] DOB/AGE: May 14, 1946 66 y.o. Primary Care Physician:Rim Thatch, MD Admit date: 09/07/2012 Chief Complaint:  Bilateral leg edema HPI:  This is a 66 years old male patient with history of multiple medical illness came to the office due bilateral leg edema and ulcerations of the legs. Patient has severe COPD and he is on home oxygen. He continued to experience shortness of breath especially on exertion. Recently he developed worsening bilateral leg edema with blisters and weeping. Some area of ulcerations area has necrotic tissue. He was sent further evaluation to Er where he was evaluated and admitted as case of cellulitis of the legs and CHF. No fever,chills, chest pain, nausea,vomiting, abdominal pain,dysuria, urgency or frequency of urination.   Past Medical History  Diagnosis Date  . Chronic bronchitis   . Hypertension    Past Surgical History  Procedure Date  . Brain surgery   . Brain tumor excision   . Nephrectomy         No family history on file.  Social History:  reports that he has quit smoking. His smoking use included Cigarettes. He does not have any smokeless tobacco history on file. He reports that he does not drink alcohol or use illicit drugs.   Allergies: No Known Allergies  Medications Prior to Admission  Medication Sig Dispense Refill  . albuterol (PROVENTIL HFA;VENTOLIN HFA) 108 (90 BASE) MCG/ACT inhaler Inhale 2 puffs into the lungs every 6 (six) hours as needed. Bronchitis      . desmopressin (DDAVP) 0.2 MG tablet Take 0.2 mg by mouth 2 (two) times daily.      Marland Kitchen dexamethasone (DECADRON) 0.5 MG tablet Take 0.5 mg by mouth daily.      . hydrALAZINE (APRESOLINE) 25 MG tablet Take 25 mg by mouth 2 (two) times daily.      Marland Kitchen levothyroxine (SYNTHROID, LEVOTHROID) 150 MCG tablet Take 150 mcg by mouth daily.      Marland Kitchen olmesartan (BENICAR) 40 MG tablet Take 40 mg by mouth daily.      . phenytoin (DILANTIN) 100 MG ER capsule Take 200-300 mg by  mouth daily. Takes 20 mg on Monday and Thursdsay. On all other days of the week takes 300 mg.      . simvastatin (ZOCOR) 80 MG tablet Take 80 mg by mouth at bedtime.           NGE:XBMWU from the symptoms mentioned above,there are no other symptoms referable to all systems reviewed.  Physical Exam: Blood pressure 164/78, pulse 97, temperature 98.4 F (36.9 C), temperature source Oral, resp. rate 18, height 6\' 1"  (1.854 m), weight 137.122 kg (302 lb 4.8 oz), SpO2 98.00%. General condition- alert and awake, sicklooking Chest- decreased air entery, bilateral rhonchi CVS- S1 and S2 heard regular ABD- soft and lax, bowel sound is + EXT 4+ bilateral leg edema with blisters and ulceration    Basename 09/07/12 1227  WBC 11.5*  NEUTROABS 9.3*  HGB 15.6  HCT 49.2  MCV 94.8  PLT 159    Basename 09/08/12 0833 09/07/12 1227  NA 140 141  K 4.8 4.4  CL 108 107  CO2 27 26  GLUCOSE 116* 80  BUN 43* 49*  CREATININE 2.25* 2.50*  CALCIUM 9.0 9.0  MG -- --  lablast2(ast:2,ALT:2,alkphos:2,bilitot:2,prot:2,albumin:2)@    No results found for this or any previous visit (from the past 240 hour(s)).   Dg Chest Port 1 View  09/07/2012  *RADIOLOGY REPORT*  Clinical Data: Shortness of breath and leg swelling.  PORTABLE CHEST - 1 VIEW  Comparison: 06/01/2012  Findings: Moderate cardiomegaly. No pleural effusion or pneumothorax.  Pulmonary artery enlargement is again identified. Mild pulmonary venous congestion. No lobar consolidation.  IMPRESSION: Cardiomegaly with development of mild pulmonary venous congestion.  Pulmonary artery enlargement suggests pulmonary arterial hypertension.   Original Report Authenticated By: Consuello Bossier, M.D.    Impression: 1. Bilateral edema and elevated BNP R/O acute CHF etiology unclear  2. Cellulitis of the legs  3. Chronic renal disease  4. Hypertension  5. COPD On oxygen  6. Sleep Apnea syndrome  7. Morbid hypothyroidism  8. Panhypopituitarism  9.S/p  pituitary tumor resection     Active Problems:  * No active hospital problems. *      Plan: Admit under   IV lasix  Iv vancomycin  Echocardiogram  Nephrology consult WE WILL MONITOR bmp AND BNP.       [name] [name]   09/08/2012, 10:56 AM

## 2012-09-08 NOTE — Progress Notes (Signed)
UR Chart Review Completed  

## 2012-09-08 NOTE — Care Management Note (Signed)
    Page 1 of 2   09/15/2012     10:59:50 AM   CARE MANAGEMENT NOTE 09/15/2012  Patient:  [name] [name], [name] [name]   Account Number:  [id number]  Date Initiated:  09/08/2012  Documentation initiated by:  Sharrie Rothman  Subjective/Objective Assessment:   Pt admitted from home with cellulitis and CHF. Pt lives alone but has a daughter and sister who are active in his care. Pt will return home at discharge. Pt is fairly independent with ADL's.     Action/Plan:   Pt may benefit from [facility] RN at discharge. Will continue to follow.   Anticipated DC Date:  09/11/2012   Anticipated DC Plan:  HOME W HOME HEALTH SERVICES      DC Planning Services  CM consult      [facility] Choice  HOME HEALTH   Choice offered to / List presented to:  C-1 Patient        HH arranged  HH-1 RN  HH-10 DISEASE MANAGEMENT      HH agency  [facility]   Status of service:  Completed, signed off Medicare Important Message given?  YES (If response is "NO", the following Medicare IM given date fields will be blank) Date Medicare IM given:  09/10/2012 Date Additional Medicare IM given:  09/15/2012  Discharge Disposition:    Per UR Regulation:    If discussed at Long Length of Stay Meetings, dates discussed:   09/14/2012    Comments:  09/15/12 1100 Orlandria Kissner, RN BSN CM Pt discharged to [facility] in [place], [place]. CSW will arrange discharge to facility.  09/10/12 1439 Arlyss Queen, RN BSN CM Pt and pts daughter has requested home health at discharge. Pt chose [facility] for RN for CHF management. Tula Nakayama of [facility] is aware and will collect the pts information from the chart. No DME needs at this time.  09/08/12 1311 Arlyss Queen, RN BSN CM

## 2012-09-08 NOTE — Progress Notes (Signed)
Subjective: Patient complains severe bilateral edema which is oozing fluid. No fever or discharge. He has some blisters and few area of redness and ulceration.   Objective: Vital signs in last 24 hours: Temp:  [98.4 F (36.9 C)-99.2 F (37.3 C)] 98.4 F (36.9 C) (10/02 0556) Pulse Rate:  [87-97] 97  (10/02 0556) Resp:  [18-22] 18  (10/02 0556) BP: (137-164)/(63-78) 164/78 mmHg (10/02 0556) SpO2:  [97 %-99 %] 98 % (10/02 0756) Weight:  [127.007 kg (280 lb)-137.122 kg (302 lb 4.8 oz)] 137.122 kg (302 lb 4.8 oz) (10/01 1641) Weight change:  Last BM Date: 09/06/12  Intake/Output from previous day: 10/01 0701 - 10/02 0700 In: -  Out: 2525 [Urine:2525]  PHYSICAL EXAM General appearance: alert and no distress Resp: diminished breath sounds bibasilar and rhonchi bilaterally Cardio: S1, S2 normal GI: soft, non-tender; bowel sounds normal; no masses,  no organomegaly Extremities: bilateral fluid oozing edema with redness and ulcerations  Lab Results:    @labtest @ [facility] No results found for this basename: PHART,PCO2,PO2ART,TCO2,HCO3 in the last 72 hours CULTURES No results found for this or any previous visit (from the past 240 hour(s)). Studies/Results: Dg Chest Port 1 View  09/07/2012  *RADIOLOGY REPORT*  Clinical Data: Shortness of breath and leg swelling.  PORTABLE CHEST - 1 VIEW  Comparison: 06/01/2012  Findings: Moderate cardiomegaly. No pleural effusion or pneumothorax.  Pulmonary artery enlargement is again identified. Mild pulmonary venous congestion. No lobar consolidation.  IMPRESSION: Cardiomegaly with development of mild pulmonary venous congestion.  Pulmonary artery enlargement suggests pulmonary arterial hypertension.   Original Report Authenticated By: Consuello Bossier, M.D.     Medications: I have reviewed the patient's current medications.  Assesment: 1. Bilateral edema and elevated BNP R/O acute CHF etiology unclear 2. Cellulitis of the legs 3. Chronic renal  disease 4. Hypertension 5. COPD On oxygen 6. Sleep Apnea syndrome 7. Morbid hypothyroidism 8. Panhypopituitarism  9.S/p pituitary tumor resection Active Problems:  * No active hospital problems. *     Plan: We will continue IV lasix Iv vancomycin Echocardiogram Nephrology consult    LOS: 1 day   [name] [name] 09/08/2012, 8:00 AM

## 2012-09-08 NOTE — Progress Notes (Signed)
ANTIBIOTIC CONSULT NOTE - INITIAL  Pharmacy Consult for Vancomycin Indication: cellulitis  No Known Allergies  Patient Measurements: Height: 6\' 1"  (185.4 cm) Weight: 302 lb 4.8 oz (137.122 kg) IBW/kg (Calculated) : 79.9   Vital Signs: Temp: 98.4 F (36.9 C) (10/02 0556) Temp src: Oral (10/02 0556) BP: 164/78 mmHg (10/02 0556) Pulse Rate: 97  (10/02 0556) Intake/Output from previous day: 10/01 0701 - 10/02 0700 In: -  Out: 2525 [Urine:2525] Intake/Output from this shift: Total I/O In: 240 [P.O.:240] Out: -   Labs:  Basename 09/07/12 1227  WBC 11.5*  HGB 15.6  PLT 159  LABCREA --  CREATININE 2.50*   Estimated Creatinine Clearance: 41.7 ml/min (by C-G formula based on Cr of 2.5). No results found for this basename: VANCOTROUGH:2,VANCOPEAK:2,VANCORANDOM:2,GENTTROUGH:2,GENTPEAK:2,GENTRANDOM:2,TOBRATROUGH:2,TOBRAPEAK:2,TOBRARND:2,AMIKACINPEAK:2,AMIKACINTROU:2,AMIKACIN:2, in the last 72 hours   Microbiology: No results found for this or any previous visit (from the past 720 hour(s)).  Medical History: Past Medical History  Diagnosis Date  . Chronic bronchitis   . Hypertension     Medications:  Scheduled:    . albuterol  2 puff Inhalation Q4H  . atorvastatin  40 mg Oral q1800  . desmopressin  0.2 mg Oral BID  . dexamethasone  0.5 mg Oral Daily  . enoxaparin (LOVENOX) injection  60 mg Subcutaneous Q24H  . furosemide  40 mg Intravenous Once  . furosemide  60 mg Intravenous Q12H  . hydrALAZINE  25 mg Oral BID  . irbesartan  75 mg Oral Daily  . levothyroxine  150 mcg Oral Daily  . phenytoin  200 mg Oral Custom   And  . phenytoin  300 mg Oral Custom  . vancomycin  2,000 mg Intravenous Q24H  . vancomycin  1,000 mg Intravenous Once  . DISCONTD: phenytoin  200-300 mg Oral Daily   Assessment: 66 yo M admitted with HF exacerbation & cellulitis.  Received Vancomycin 1gm IV at 1400 yesterday.  Scr at baseline- medications adjusted for obesity and chronic renal  insufficiency.   Goal of Therapy:  Vancomycin trough level 10-15 mcg/ml  Plan:  1) Vancomycin 2gm IV Q24h 2) Check Vancomycin trough at steady state 3) Monitor renal function and cx data   [name] [name] 09/08/2012,8:18 AM

## 2012-09-09 ENCOUNTER — Inpatient Hospital Stay (HOSPITAL_COMMUNITY): Payer: Medicare Other

## 2012-09-09 LAB — CBC
HCT: 49.7 % (ref 39.0–52.0)
Hemoglobin: 15.9 g/dL (ref 13.0–17.0)
MCH: 29.8 pg (ref 26.0–34.0)
MCHC: 32 g/dL (ref 30.0–36.0)
MCV: 93.2 fL (ref 78.0–100.0)
Platelets: 139 10*3/uL — ABNORMAL LOW (ref 150–400)
RBC: 5.33 MIL/uL (ref 4.22–5.81)
RDW: 16.4 % — ABNORMAL HIGH (ref 11.5–15.5)
WBC: 11.5 10*3/uL — ABNORMAL HIGH (ref 4.0–10.5)

## 2012-09-09 LAB — BASIC METABOLIC PANEL
BUN: 40 mg/dL — ABNORMAL HIGH (ref 6–23)
CO2: 29 mEq/L (ref 19–32)
Calcium: 8.8 mg/dL (ref 8.4–10.5)
Chloride: 105 mEq/L (ref 96–112)
Creatinine, Ser: 2.36 mg/dL — ABNORMAL HIGH (ref 0.50–1.35)
GFR calc Af Amer: 31 mL/min — ABNORMAL LOW (ref >90)
GFR calc non Af Amer: 27 mL/min — ABNORMAL LOW (ref >90)
Glucose, Bld: 90 mg/dL (ref 70–99)
Potassium: 4.8 mEq/L (ref 3.5–5.1)
Sodium: 140 mEq/L (ref 135–145)

## 2012-09-09 LAB — PHOSPHORUS: Phosphorus: 3.2 mg/dL (ref 2.3–4.6)

## 2012-09-09 MED ORDER — SODIUM CHLORIDE 0.9 % IJ SOLN
INTRAMUSCULAR | Status: AC
  Administered 2012-09-09: 03:00:00
  Filled 2012-09-09: qty 3

## 2012-09-09 NOTE — Progress Notes (Signed)
Subjective: Interval History: has no complaint of difficulty in breathing. Patient says that his leg swelling has improved. As this moment he doesn't have any nausea or vomiting.  Objective: Vital signs in last 24 hours: Temp:  [97.8 F (36.6 C)-98.4 F (36.9 C)] 97.8 F (36.6 C) (10/03 1039) Pulse Rate:  [82-95] 90  (10/03 1039) Resp:  [20-22] 22  (10/03 1039) BP: (137-180)/(64-67) 161/67 mmHg (10/03 1039) SpO2:  [96 %-98 %] 98 % (10/03 1039) Weight change:   Intake/Output from previous day: 10/02 0701 - 10/03 0700 In: 728 [P.O.:720; IV Piggyback:8] Out: 4920 [Urine:4920] Intake/Output this shift: Total I/O In: 1308 [P.O.:800; IV Piggyback:508] Out: 550 [Urine:550]  Generally patient is alert no apparent distress. Chest: Decreased breath sound bilaterally he has some expiratory wheezing. Heart exam revealed distant S1-S2, regular rate and rhythm no murmur Abdomen: Obese difficult to palpate organomegaly, positive bowel sounds. Extremities he has 3+ bilateral edema was some superficial lesion.  Lab Results:  Basename 09/09/12 0536 09/07/12 1227  WBC 11.5* 11.5*  HGB 15.9 15.6  HCT 49.7 49.2  PLT 139* 159   BMET:  Basename 09/09/12 0536 09/08/12 0833  NA 140 140  K 4.8 4.8  CL 105 108  CO2 29 27  GLUCOSE 90 116*  BUN 40* 43*  CREATININE 2.36* 2.25*  CALCIUM 8.8 9.0   No results found for this basename: PTH:2 in the last 72 hours Iron Studies: No results found for this basename: IRON,TIBC,TRANSFERRIN,FERRITIN in the last 72 hours  Studies/Results: Dg Chest Port 1 View  09/07/2012  *RADIOLOGY REPORT*  Clinical Data: Shortness of breath and leg swelling.  PORTABLE CHEST - 1 VIEW  Comparison: 06/01/2012  Findings: Moderate cardiomegaly. No pleural effusion or pneumothorax.  Pulmonary artery enlargement is again identified. Mild pulmonary venous congestion. No lobar consolidation.  IMPRESSION: Cardiomegaly with development of mild pulmonary venous congestion.   Pulmonary artery enlargement suggests pulmonary arterial hypertension.   Original Report Authenticated By: Consuello Bossier, M.D.     I have reviewed the patient'[name] current medications.  Assessment/Plan: Problem #1 renal failure at this moment most likely chronic his BUN is 40 creatinine is 2.36. Etiology not clear  but possibly ischemicvsnonsteroidal. Problem #2 history of nephrectomy .P patient doesn't remember why he had the surgery. It was done about a year ago at [facility]. Problem #3 anasarca at this moment multifactorial according to the patient he seems to be improving but still patient with significant edema. Problem #4 history of COPD is on inhaler Problem #5 history of hypothyroidism is on Synthroid Problem #6 history of obesity Problem #7 possible sleep apnea Plan: We'll continue his diuretics We'll follow his basic metabolic panel and phosphorus in the morning. We'll do ultrasound of the kidneys.  LOS: 2 days   [name] [name] 09/09/2012,11:21 AM

## 2012-09-09 NOTE — Progress Notes (Signed)
Subjective: Patient feels slightly better. [name] diuresing well. The weeping is improving. His echo showed LVH and his EF is ok.   Objective: Vital signs in last 24 hours: Temp:  [98.1 F (36.7 C)-98.4 F (36.9 C)] 98.4 F (36.9 C) (10/03 0516) Pulse Rate:  [82-98] 82  (10/03 0516) Resp:  [20] 20  (10/03 0516) BP: (137-180)/(64-65) 137/65 mmHg (10/03 0516) SpO2:  [96 %-98 %] 97 % (10/03 0516) Weight change:  Last BM Date: 09/08/12  Intake/Output from previous day: 10/02 0701 - 10/03 0700 In: 728 [P.O.:720; IV Piggyback:8] Out: 4920 [Urine:4920]  PHYSICAL EXAM General appearance: alert and no distress Resp: clear to auscultation bilaterally Cardio: S1, S2 normal GI: soft, non-tender; bowel sounds normal; no masses,  no organomegaly Extremities: bilateral leg edema with weeping, blisters and ulceration  Lab Results:    @labtest @ [facility] No results found for this basename: PHART,PCO2,PO2ART,TCO2,HCO3 in the last 72 hours CULTURES No results found for this or any previous visit (from the past 240 hour(s)). Studies/Results: Dg Chest Port 1 View  09/07/2012  *RADIOLOGY REPORT*  Clinical Data: Shortness of breath and leg swelling.  PORTABLE CHEST - 1 VIEW  Comparison: 06/01/2012  Findings: Moderate cardiomegaly. No pleural effusion or pneumothorax.  Pulmonary artery enlargement is again identified. Mild pulmonary venous congestion. No lobar consolidation.  IMPRESSION: Cardiomegaly with development of mild pulmonary venous congestion.  Pulmonary artery enlargement suggests pulmonary arterial hypertension.   Original Report Authenticated By: Consuello Bossier, M.D.     Medications: I have reviewed the patient's current medications.  Assesment: . Bilateral edema and elevated BNP, echo showed only LVH. It is unlikely to be Cardiac. 2. Cellulitis of the legs  3. Chronic renal disease  4. Hypertension  5. COPD On oxygen  6. Sleep Apnea syndrome  7. Morbid hypothyroidism  8.  Panhypopituitarism  9.S/p pituitary tumor resection  Active Problems:  * No active hospital problems. *     Plan: Continue IV diuresis Continue IV antibiotics Nephrology consult appreciated We will monitor BMP     LOS: 2 days   Juliza Machnik 09/09/2012, 7:41 AM

## 2012-09-10 LAB — BASIC METABOLIC PANEL
BUN: 38 mg/dL — ABNORMAL HIGH (ref 6–23)
Chloride: 103 mEq/L (ref 96–112)
Creatinine, Ser: 2.21 mg/dL — ABNORMAL HIGH (ref 0.50–1.35)
GFR calc Af Amer: 34 mL/min — ABNORMAL LOW (ref >90)
Glucose, Bld: 108 mg/dL — ABNORMAL HIGH (ref 70–99)

## 2012-09-10 LAB — PTH, INTACT AND CALCIUM
Calcium, Total (PTH): 8.5 mg/dL (ref 8.4–10.5)
PTH: 230.9 pg/mL — ABNORMAL HIGH (ref 14.0–72.0)

## 2012-09-10 LAB — CBC
HCT: 50.6 % (ref 39.0–52.0)
MCH: 29.9 pg (ref 26.0–34.0)
MCHC: 32 g/dL (ref 30.0–36.0)
RDW: 16.1 % — ABNORMAL HIGH (ref 11.5–15.5)

## 2012-09-10 LAB — PHOSPHORUS: Phosphorus: 3.1 mg/dL (ref 2.3–4.6)

## 2012-09-10 MED ORDER — TORSEMIDE 20 MG PO TABS
40.0000 mg | ORAL_TABLET | Freq: Once | ORAL | Status: AC
  Administered 2012-09-10: 40 mg via ORAL
  Filled 2012-09-10: qty 2

## 2012-09-10 NOTE — Plan of Care (Signed)
Problem: Phase I Progression Outcomes Goal: EF % per last Echo/documented,Core Reminder form on chart Outcome: Completed/Met Date Met:  09/10/12 2D ECHO 55-60% EJF

## 2012-09-10 NOTE — Progress Notes (Signed)
Subjective: Interval History: has no complaint of nausea or vomiting. Presently patient says that he's feeling better. He denies any difficulty breathing..  Objective: Vital signs in last 24 hours: Temp:  [97.7 F (36.5 C)-98.4 F (36.9 C)] 98.4 F (36.9 C) (10/04 0510) Pulse Rate:  [81-90] 81  (10/04 0510) Resp:  [18-22] 20  (10/04 0510) BP: (143-161)/(63-67) 146/67 mmHg (10/04 0510) SpO2:  [94 %-99 %] 94 % (10/04 0510) Weight:  [129.502 kg (285 lb 8 oz)] 129.502 kg (285 lb 8 oz) (10/04 0339) Weight change:   Intake/Output from previous day: 10/03 0701 - 10/04 0700 In: 2516 [P.O.:2000; IV Piggyback:516] Out: 2875 [Urine:2875] Intake/Output this shift:    General appearance: alert, cooperative and no distress Resp: diminished breath sounds bilaterally and posterior - bilateral Cardio: regular rate and rhythm, S1, S2 normal, no murmur, click, rub or gallop GI: Obese, nontender difficult to palpate organomegaly. Positive bowel sound Extremities: edema He has 2-3+ edema bilaterally. At this moment his edema seems to be improving.  Lab Results:  Basename 09/10/12 0551 09/09/12 0536  WBC 10.3 11.5*  HGB 16.2 15.9  HCT 50.6 49.7  PLT 137* 139*   BMET:  Basename 09/10/12 0551 09/09/12 0536  NA 139 140  K 4.7 4.8  CL 103 105  CO2 29 29  GLUCOSE 108* 90  BUN 38* 40*  CREATININE 2.21* 2.36*  CALCIUM 9.0 8.8   No results found for this basename: PTH:2 in the last 72 hours Iron Studies: No results found for this basename: IRON,TIBC,TRANSFERRIN,FERRITIN in the last 72 hours  Studies/Results: US Renal  09/09/2012  *RADIOLOGY REPORT*  Clinical Data: Status post right nephrectomy, elevated BUN/creatinine  RENAL/URINARY TRACT ULTRASOUND COMPLETE  Comparison:  MRI abdomen dated 01/08/2009. Renal ultrasound dated 12/28/2008.  Findings:  Right Kidney:  Surgically absent.  Left Kidney:  Measures 12.0 cm.  Increased parenchymal echogenicity, suggesting medical renal disease.  1.8 x 1.7  x 2.1 cm complex upper pole lesion which cannot be characterized as a simple cyst, although grossly unchanged from 2010. Additional 1.6 x 1.4 x 1.6 in the interpolar cyst.  No hydronephrosis.  Bladder:  Underdistended.  IMPRESSION: Right kidney is surgically absent.  2.1 cm complex left upper pole lesion which cannot be characterized as a simple cyst, although grossly unchanged from 2010.  Consider MRI abdomen (with half dose contrast if GFR remains > 30) for further characterization.  No hydronephrosis.   Original Report Authenticated By: Charline Bills, M.D.     I have reviewed the patient's current medications.  Assessment/Plan: Problem #1 renal failure at this moment most likely chronic his pedis 58 creatinine is 2.21 renal function seems to be improving. Presently patient doesn't have any uremic sign and symptoms. Problem #2 right nephrectomy Problem #3 anasarca patient on diuretics as this moment his urine output is good and he said Kara Mead seems to be improving. Problem #4 history of hypertension blood pressure is controlled very well Problem #5 history of hypothyroidism is on Synthroid. Presently patient seems to be getting DDAVP and Decadron hence patient might have possibly pituitary problem. However at this moment I could not  Find documentation  Problem #6 patient also was possible 2.1 cm complex left upper pole lesion. Problem #7 history of seizure disorder. Her Plan:  we'll DC Lasix and will start patient on Demadex. Her as this moment patient possibly may benefit from followup by urology.    LOS: 3 days   Moncerrath Berhe S 09/10/2012,

## 2012-09-10 NOTE — Progress Notes (Signed)
Subjective: Patient is impring. [name] on iv diuretics. He he has good urine out put (4920 ml). His renal ultrasound showed complex kidney cyst.  Objective: Vital signs in last 24 hours: Temp:  [97.7 F (36.5 C)-98.4 F (36.9 C)] 98.4 F (36.9 C) (10/04 0510) Pulse Rate:  [81-90] 81  (10/04 0510) Resp:  [18-22] 20  (10/04 0510) BP: (143-161)/(63-67) 146/67 mmHg (10/04 0510) SpO2:  [94 %-99 %] 98 % (10/04 0752) Weight:  [129.502 kg (285 lb 8 oz)] 129.502 kg (285 lb 8 oz) (10/04 0339) Weight change:  Last BM Date: 09/09/12  Intake/Output from previous day: 10/03 0701 - 10/04 0700 In: 2516 [P.O.:2000; IV Piggyback:516] Out: 2875 [Urine:2875]  PHYSICAL EXAM General appearance: alert and no distress Resp: clear to auscultation bilaterally Cardio: S1, S2 normal GI: soft, non-tender; bowel sounds normal; no masses,  no organomegaly Extremities: bilateral leg edema with weeping, blisters and ulceration  Lab Results:    @labtest @ [facility] No results found for this basename: PHART,PCO2,PO2ART,TCO2,HCO3 in the last 72 hours CULTURES No results found for this or any previous visit (from the past 240 hour(s)). Studies/Results: US Renal  09/09/2012  *RADIOLOGY REPORT*  Clinical Data: Status post right nephrectomy, elevated BUN/creatinine  RENAL/URINARY TRACT ULTRASOUND COMPLETE  Comparison:  MRI abdomen dated 01/08/2009. Renal ultrasound dated 12/28/2008.  Findings:  Right Kidney:  Surgically absent.  Left Kidney:  Measures 12.0 cm.  Increased parenchymal echogenicity, suggesting medical renal disease.  1.8 x 1.7 x 2.1 cm complex upper pole lesion which cannot be characterized as a simple cyst, although grossly unchanged from 2010. Additional 1.6 x 1.4 x 1.6 in the interpolar cyst.  No hydronephrosis.  Bladder:  Underdistended.  IMPRESSION: Right kidney is surgically absent.  2.1 cm complex left upper pole lesion which cannot be characterized as a simple cyst, although grossly unchanged from  2010.  Consider MRI abdomen (with half dose contrast if GFR remains > 30) for further characterization.  No hydronephrosis.   Original Report Authenticated By: Charline Bills, M.D.     Medications: I have reviewed the patient's current medications.  Assesment: 1.complex renal cyst 2. Cellulitis of the legs  3. Chronic renal disease  4. Hypertension  5. COPD On oxygen  6. Sleep Apnea syndrome  7. Morbid hypothyroidism  8. Panhypopituitarism  9.S/p pituitary tumor resection  Active Problems:  * No active hospital problems. *     Plan: Continue IV diuresis Continue IV antibiotics Urology consult  Discussed with Dr. Kristian Covey      LOS: 3 days   [name] [name] 09/10/2012, 8:06 AM

## 2012-09-10 NOTE — Progress Notes (Signed)
ANTIBIOTIC CONSULT NOTE   Pharmacy Consult for Vancomycin Indication: cellulitis  No Known Allergies  Patient Measurements: Height: 6\' 1"  (185.4 cm) Weight: 285 lb 8 oz (129.502 kg) IBW/kg (Calculated) : 79.9   Vital Signs: Temp: 98.3 F (36.8 C) (10/04 0800) Temp src: Oral (10/04 0800) BP: 132/70 mmHg (10/04 0800) Pulse Rate: 84  (10/04 0800) Intake/Output from previous day: 10/03 0701 - 10/04 0700 In: 2516 [P.O.:2000; IV Piggyback:516] Out: 2875 [Urine:2875] Intake/Output from this shift: Total I/O In: 480 [P.O.:480] Out: -   Labs:  Basename 09/10/12 0551 09/09/12 0536 09/08/12 1547 09/08/12 0833 09/07/12 1227  WBC 10.3 11.5* -- -- 11.5*  HGB 16.2 15.9 -- -- 15.6  PLT 137* 139* -- -- 159  LABCREA -- -- 30.64 -- --  CREATININE 2.21* 2.36* -- 2.25* --   Estimated Creatinine Clearance: 45.7 ml/min (by C-G formula based on Cr of 2.21).  Basename 09/10/12 0845  VANCOTROUGH 16.3  VANCOPEAK --  VANCORANDOM --  GENTTROUGH --  GENTPEAK --  GENTRANDOM --  TOBRATROUGH --  TOBRAPEAK --  TOBRARND --  AMIKACINPEAK --  AMIKACINTROU --  AMIKACIN --    Microbiology: No results found for this or any previous visit (from the past 720 hour(s)).  Medical History: Past Medical History  Diagnosis Date  . Chronic bronchitis   . Hypertension    Medications:  Scheduled:     . albuterol  2 puff Inhalation TID  . atorvastatin  40 mg Oral q1800  . desmopressin  0.2 mg Oral BID  . dexamethasone  0.5 mg Oral Daily  . enoxaparin (LOVENOX) injection  60 mg Subcutaneous Q24H  . hydrALAZINE  25 mg Oral BID  . levothyroxine  150 mcg Oral Daily  . phenytoin  200 mg Oral Custom   And  . phenytoin  300 mg Oral Custom  . torsemide  40 mg Oral Once  . vancomycin  2,000 mg Intravenous Q24H  . DISCONTD: furosemide  80 mg Intravenous Q12H   Assessment: 66 yo M admitted with HF exacerbation & cellulitis. Scr elevated but trough level on target at high end of range.   Goal of  Therapy:  Vancomycin trough level 10-15 mcg/ml  Plan:  1) continue Vancomycin 2gm IV Q24h 2) Check Vancomycin trough weekly 3) Monitor renal function and cx data per protocol 4) Duration of ABX per MD  Valrie Hart A 09/10/2012,11:27 AM

## 2012-09-11 LAB — BASIC METABOLIC PANEL
Calcium: 8.7 mg/dL (ref 8.4–10.5)
GFR calc Af Amer: 34 mL/min — ABNORMAL LOW (ref >90)
GFR calc non Af Amer: 29 mL/min — ABNORMAL LOW (ref >90)
Potassium: 4.6 mEq/L (ref 3.5–5.1)
Sodium: 138 mEq/L (ref 135–145)

## 2012-09-11 LAB — CBC
Hemoglobin: 15.6 g/dL (ref 13.0–17.0)
MCHC: 32.2 g/dL (ref 30.0–36.0)
Platelets: 135 10*3/uL — ABNORMAL LOW (ref 150–400)
RDW: 15.9 % — ABNORMAL HIGH (ref 11.5–15.5)

## 2012-09-11 MED ORDER — TORSEMIDE 20 MG PO TABS: 40.0000 mg | ORAL_TABLET | Freq: Every day | ORAL | Status: DC

## 2012-09-11 MED ORDER — CALCITRIOL 0.25 MCG PO CAPS
0.2500 ug | ORAL_CAPSULE | ORAL | Status: DC
  Administered 2012-09-13 – 2012-09-15 (×2): 0.25 ug via ORAL
  Filled 2012-09-11 (×2): qty 1

## 2012-09-11 MED ORDER — TORSEMIDE 20 MG PO TABS
60.0000 mg | ORAL_TABLET | Freq: Every day | ORAL | Status: DC
  Administered 2012-09-11 – 2012-09-13 (×3): 60 mg via ORAL
  Filled 2012-09-11 (×3): qty 3

## 2012-09-11 NOTE — Progress Notes (Signed)
Subjective: Patient feels slightly better. His leg edema is subsiding. His cellulitis improving. Objective: Vital signs in last 24 hours: Temp:  [98 F (36.7 C)-98.4 F (36.9 C)] 98 F (36.7 C) (10/05 0428) Pulse Rate:  [83-86] 84  (10/05 0428) Resp:  [20] 20  (10/05 0428) BP: (134-148)/(58-66) 134/66 mmHg (10/05 0428) SpO2:  [94 %-100 %] 98 % (10/05 0428) Weight:  [133.675 kg (294 lb 11.2 oz)] 133.675 kg (294 lb 11.2 oz) (10/05 0428) Weight change: 4.173 kg (9 lb 3.2 oz) Last BM Date: 09/10/12  Intake/Output from previous day: 10/04 0701 - 10/05 0700 In: 1440 [P.O.:1440] Out: 2875 [Urine:2875]  PHYSICAL EXAM General appearance: alert and no distress Resp: clear to auscultation bilaterally Cardio: S1, S2 normal GI: soft, non-tender; bowel sounds normal; no masses,  no organomegaly Extremities: bilateral leg edema with weeping, blisters and ulceration  Lab Results:    @labtest @ [facility] No results found for this basename: PHART,PCO2,PO2ART,TCO2,HCO3 in the last 72 hours CULTURES No results found for this or any previous visit (from the past 240 hour(s)). Studies/Results: US Renal  09/09/2012  *RADIOLOGY REPORT*  Clinical Data: Status post right nephrectomy, elevated BUN/creatinine  RENAL/URINARY TRACT ULTRASOUND COMPLETE  Comparison:  MRI abdomen dated 01/08/2009. Renal ultrasound dated 12/28/2008.  Findings:  Right Kidney:  Surgically absent.  Left Kidney:  Measures 12.0 cm.  Increased parenchymal echogenicity, suggesting medical renal disease.  1.8 x 1.7 x 2.1 cm complex upper pole lesion which cannot be characterized as a simple cyst, although grossly unchanged from 2010. Additional 1.6 x 1.4 x 1.6 in the interpolar cyst.  No hydronephrosis.  Bladder:  Underdistended.  IMPRESSION: Right kidney is surgically absent.  2.1 cm complex left upper pole lesion which cannot be characterized as a simple cyst, although grossly unchanged from 2010.  Consider MRI abdomen (with half dose  contrast if GFR remains > 30) for further characterization.  No hydronephrosis.   Original Report Authenticated By: Charline Bills, M.D.     Medications: I have reviewed the patient's current medications.  Assesment: 1.Bilateral leg edema. 2. Cellulitis of the legs  3. Chronic renal disease  4. Hypertension  5. COPD On oxygen  6. Sleep Apnea syndrome  7. Morbid hypothyroidism  8. Panhypopituitarism  9.S/p pituitary tumor resection  Active Problems:  * No active hospital problems. *     Plan: Continue diuretics Continue IV antibiotics As per Nephrology plan We will monitor BMP     LOS: 4 days   [name] [name] 09/11/2012, 12:31 PM

## 2012-09-11 NOTE — Consult Note (Signed)
NAME:  [name] [name], [name] [name]                  ACCOUNT NO.:  [id number]  MEDICAL RECORD NO.:  [id number]  LOCATION:  [facility]                         FACILITY:  [facility]  PHYSICIAN:  Ky Barban, M.D.DATE OF BIRTH:  [birth year]  DATE OF CONSULTATION: DATE OF DISCHARGE:                                CONSULTATION   CHIEF COMPLAINT:  Complex cyst, left kidney upper pole.  A 66 year old gentleman has undergone nephrectomy on the right side about a year ago in [facility].  He does not know why but I reviewed his old chart and looks to me he was seen by Dr. Dennie Maizes.  He was having gross hematuria.  He was sent there with a mass.  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  Now he has developed chronic renal failure.  He does have history of hypertension and congestive heart failure.  Edema of his lower extremity is getting worse.  He was seen by his primary care physician.  So the patient is admitted for further management.  [name] found to have creatinine of 2.50.  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  Left kidney which is a solitary kidney has increased parenchymal echogenicity, so the renal failure is probably medical in origin and he has been evaluated by nephrologist.  According to him, he has prerenal azotemia.  There is no hydronephrosis and there is a complex cyst, upper pole.  Although grossly unchanged from 2010, additional cyst 1.6 x 1.4 x 1.6 cm interpolar region is seen.  No hydronephrosis.  Bladder is under distended.  Right kidney is surgically absent, so most likely it is a complex cyst.  There is no renal cell carcinoma inadvertent.  That is one thing which needs to be ruled out.  The patient has no urological problems.  His creatinine is 2.21.  I will see it is slowly coming down with treatment by the nephrologist.  Hopefully if  it comes down in the normal range, then we will be able to use contrast study, otherwise we will have to do MRI, have the contrast study to make sure it is not renal cell carcinoma.  I have discussed this finding with the patient. I will re-evaluate him after I look at his chart in the office on Monday.     Ky Barban, M.D.     MIJ/MEDQ  D:  09/11/2012  T:  09/11/2012  Job:  [id number]

## 2012-09-11 NOTE — Consult Note (Signed)
Addendum i reviewed mri done in 01/08/2009 for microscopic hemeturia. It shoed r renal cell ca and [name] [name] underwet r nephrectomy in [facility] . 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 It has not changed in size so most like it is a cyst.a mass was also found in r lobe of liver possble metastatic lesion but could be related to r renal cell ca. 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

## 2012-09-11 NOTE — Progress Notes (Signed)
[name] [name]  MRN: [id number]  DOB/AGE: [birth year] [age] y.o.  Primary Care Physician:FANTA,TESFAYE, MD  Admit date: 09/07/2012  Chief Complaint:  Chief Complaint  Patient presents with  . Leg Swelling    S-Pt presented on  09/07/2012 with  Chief Complaint  Patient presents with  . Leg Swelling  .    Pt today feels better.,Pt feels hi leg swelling is better.   Meds     . albuterol  2 puff Inhalation TID  . atorvastatin  40 mg Oral q1800  . desmopressin  0.2 mg Oral BID  . dexamethasone  0.5 mg Oral Daily  . enoxaparin (LOVENOX) injection  60 mg Subcutaneous Q24H  . hydrALAZINE  25 mg Oral BID  . levothyroxine  150 mcg Oral Daily  . phenytoin  200 mg Oral Custom   And  . phenytoin  300 mg Oral Custom  . torsemide  40 mg Oral Once  . vancomycin  2,000 mg Intravenous Q24H    Torsemide Dropped from med list?      Physical Exam: Vital signs in last 24 hours: Temp:  [98 F (36.7 C)-98.4 F (36.9 C)] 98 F (36.7 C) (10/05 0428) Pulse Rate:  [83-86] 84  (10/05 0428) Resp:  [20] 20  (10/05 0428) BP: (134-148)/(58-72) 134/66 mmHg (10/05 0428) SpO2:  [94 %-100 %] 98 % (10/05 0428) Weight:  [294 lb 11.2 oz (133.675 kg)] 294 lb 11.2 oz (133.675 kg) (10/05 0428) Weight change: 9 lb 3.2 oz (4.173 kg) Last BM Date: 09/10/12  Intake/Output from previous day: 10/04 0701 - 10/05 0700 In: 1440 [P.O.:1440] Out: 2875 [Urine:2875]     Physical Exam: General- pt is awake,alert, oriented to time place and person Resp- No acute REsp distress, CTA B/L NO Rhonchi CVS- S1S2 regular in rate and rhythm GIT- BS+, soft, NT, ND EXT- 2+ LE Edema, NO Cyanosis   Lab Results: CBC  Basename 09/11/12 0611 09/10/12 0551  WBC 9.7 10.3  HGB 15.6 16.2  HCT 48.5 50.6  PLT 135* 137*    BMET  Basename 09/11/12 0611 09/10/12 0551  NA 138 139  K 4.6 4.7  CL 102 103  CO2 31 29  GLUCOSE 105* 108*  BUN 35* 38*  CREATININE 2.21* 2.21*  CALCIUM 8.7 9.0    Creat  2013  2.5==>2.2  2012 1.5--1.7  2011 1.79     Lab Results  Component Value Date   PTH 230.9* 09/09/2012   CALCIUM 8.7 09/11/2012   PHOS 3.1 09/10/2012     vanco 16.3   Impression: 1)Renal  AKI secondary to Prerenal / ATN vs CKD Progression  Creat now stable at 2.2 CKD stage 3.  CKD since 2011 ( [facility] [place] before that)  CKD secondary to HTN/ Low Glomerular mass( s/p nephrectomy)  Progression of CKD slow    2)HTN BP at goal Target Organ damage  CKD  Medication-  On Diuretics.  On Vasodilators.   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   4)CKD Mineral-Bone Disorder-  PTH HIgh Secondary Hyperparathyroidism Present.  Phosphorus at goal Vitamin 25-OH will check.   5)REsp- COPd  stable   6)FEN  Normokalemic  NOrmonatremic   7)Acid base  Co2 at goal   8) ID Cellulitis  ON ABX  9) urology- pt with hx of nephrectomy and Now complex cyst in other kidney Agree with urology input.   Plan:  Will continue current plan of diuresis. Will order torsemide 60 mg po on Daily basis. Will follow Bmet Will start calcitriol  for High PTH      BHUTANI,MANPREET S 09/11/2012, 10:00 AM

## 2012-09-12 LAB — BASIC METABOLIC PANEL
BUN: 34 mg/dL — ABNORMAL HIGH (ref 6–23)
CO2: 31 mEq/L (ref 19–32)
Calcium: 8.7 mg/dL (ref 8.4–10.5)
Chloride: 102 mEq/L (ref 96–112)
Creatinine, Ser: 2.15 mg/dL — ABNORMAL HIGH (ref 0.50–1.35)
GFR calc Af Amer: 35 mL/min — ABNORMAL LOW (ref >90)
GFR calc non Af Amer: 30 mL/min — ABNORMAL LOW (ref >90)
Glucose, Bld: 87 mg/dL (ref 70–99)
Potassium: 4.7 mEq/L (ref 3.5–5.1)
Sodium: 141 mEq/L (ref 135–145)

## 2012-09-12 MED ORDER — SODIUM CHLORIDE 0.9 % IJ SOLN
INTRAMUSCULAR | Status: AC
  Administered 2012-09-12: 10 mL
  Filled 2012-09-12: qty 3

## 2012-09-12 NOTE — Progress Notes (Signed)
[name] [name]  MRN: [id number]  DOB/AGE: 02-08-1946 66 y.o.  Primary Care Physician:FANTA,TESFAYE, MD  Admit date: 09/07/2012  Chief Complaint:  Chief Complaint  Patient presents with  . Leg Swelling    S-Pt presented on  09/07/2012 with  Chief Complaint  Patient presents with  . Leg Swelling  . Pt feels his leg swelling is better.   Meds     . albuterol  2 puff Inhalation TID  . atorvastatin  40 mg Oral q1800  . calcitRIOL  0.25 mcg Oral 3 times weekly  . desmopressin  0.2 mg Oral BID  . dexamethasone  0.5 mg Oral Daily  . enoxaparin (LOVENOX) injection  60 mg Subcutaneous Q24H  . hydrALAZINE  25 mg Oral BID  . levothyroxine  150 mcg Oral Daily  . phenytoin  200 mg Oral Custom   And  . phenytoin  300 mg Oral Custom  . torsemide  60 mg Oral Daily  . vancomycin  2,000 mg Intravenous Q24H  . DISCONTD: torsemide  40 mg Oral Daily    Torsemide Dropped from med list?      Physical Exam: Vital signs in last 24 hours: Temp:  [97.9 F (36.6 C)-98.9 F (37.2 C)] 97.9 F (36.6 C) (10/06 0423) Pulse Rate:  [79-93] 79  (10/06 0423) Resp:  [20] 20  (10/06 0423) BP: (127-149)/(56-61) 145/61 mmHg (10/06 0423) SpO2:  [96 %-100 %] 96 % (10/06 0423) Weight change:  Last BM Date: 09/11/12  Intake/Output from previous day: 10/05 0701 - 10/06 0700 In: 720 [P.O.:720] Out: 3850 [Urine:3850]     Physical Exam: General- pt is awake,alert, oriented to time place and person Resp- No acute REsp distress, CTA B/L NO Rhonchi CVS- S1S2 regular in rate and rhythm GIT- BS+, soft, NT, ND EXT- 2+ LE Edema, NO Cyanosis   Lab Results: CBC  Basename 09/11/12 0611 09/10/12 0551  WBC 9.7 10.3  HGB 15.6 16.2  HCT 48.5 50.6  PLT 135* 137*    BMET  Basename 09/12/12 0717 09/11/12 0611  NA 141 138  K 4.7 4.6  CL 102 102  CO2 31 31  GLUCOSE 87 105*  BUN 34* 35*  CREATININE 2.15* 2.21*  CALCIUM 8.7 8.7    Creat  2013 2.5==>2.2 ==>2.15 2012 1.5--1.7  2011 1.79      Lab Results  Component Value Date   PTH 230.9* 09/09/2012   CALCIUM 8.7 09/12/2012   PHOS 3.1 09/10/2012     vanco 16.3   Impression: 1)Renal  AKI secondary to Prerenal / ATN vs CKD Progression  Creat trending down slowly CKD stage 3.  CKD since 2011 ( [place] [place] before that)  CKD secondary to HTN/ Low Glomerular mass( s/p nephrectomy)  Progression of CKD slow    2)HTN BP at goal Target Organ damage  CKD  Medication-  On Diuretics.  On Vasodilators.   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   4)CKD Mineral-Bone Disorder-  PTH HIgh Secondary Hyperparathyroidism Present.  Phosphorus at goal   5)REsp- COPd  stable   6)FEN  Normokalemic  NOrmonatremic   7)Acid base  Co2 at goal   8) ID Cellulitis  ON ABX  9) urology- pt with hx of nephrectomy and Now complex cyst in other kidney Agree with urology input.   Plan:  Will continue current plan of diuresis.       Dorlisa Savino S 09/12/2012, 10:13 AM

## 2012-09-12 NOTE — Progress Notes (Signed)
Subjective: He feels better. Continued to have weeping from the lt leg. There is also necrotic ulceration. No fever or chills Objective: Vital signs in last 24 hours: Temp:  [97.9 F (36.6 C)-98.9 F (37.2 C)] 97.9 F (36.6 C) (10/06 0423) Pulse Rate:  [79-93] 79  (10/06 0423) Resp:  [20] 20  (10/06 0423) BP: (127-149)/(56-61) 145/61 mmHg (10/06 0423) SpO2:  [96 %-100 %] 96 % (10/06 0423) Weight change:  Last BM Date: 09/11/12  Intake/Output from previous day: 10/05 0701 - 10/06 0700 In: 720 [P.O.:720] Out: 3850 [Urine:3850]  PHYSICAL EXAM General appearance: alert and no distress Resp: clear to auscultation bilaterally Cardio: S1, S2 normal GI: soft, non-tender; bowel sounds normal; no masses,  no organomegaly Extremities: bilateral leg edema with weeping, blisters and ulceration  Lab Results:    @labtest @ [facility] No results found for this basename: PHART,PCO2,PO2ART,TCO2,HCO3 in the last 72 hours CULTURES No results found for this or any previous visit (from the past 240 hour(s)). Studies/Results: No results found.  Medications: I have reviewed the patient's current medications.  Assesment: 1.Bilateral leg edema. 2. Cellulitis of the legs  3. Chronic renal disease  4. Hypertension  5. COPD On oxygen  6. Sleep Apnea syndrome  7. Morbid hypothyroidism  8. Panhypopituitarism  9.S/p pituitary tumor resection  Active Problems:  * No active hospital problems. *     Plan: Continue diuretics Continue IV antibiotics As per Nephrology plan We will monitor BMP We will refer to LTAC for iv antibiotic and wound care.     LOS: 5 days   Oralee Rapaport 09/12/2012, 10:53 AM

## 2012-09-13 LAB — GLUCOSE, CAPILLARY: Glucose-Capillary: 114 mg/dL — ABNORMAL HIGH (ref 70–99)

## 2012-09-13 MED ORDER — COLLAGENASE 250 UNIT/GM EX OINT
TOPICAL_OINTMENT | Freq: Every day | CUTANEOUS | Status: DC
  Administered 2012-09-13 – 2012-09-15 (×3): via TOPICAL
  Filled 2012-09-13: qty 30

## 2012-09-13 NOTE — Progress Notes (Signed)
Subjective: Continued to have significant leg edema with weeping especially the lt leg. [name] diuresing well. No fever or chills. Objective: Vital signs in last 24 hours: Temp:  [98.7 F (37.1 C)-99 F (37.2 C)] 99 F (37.2 C) (10/07 0411) Pulse Rate:  [86] 86  (10/07 0411) Resp:  [20] 20  (10/07 0411) BP: (136-156)/(59-69) 136/69 mmHg (10/07 0411) SpO2:  [98 %-99 %] 98 % (10/07 0411) Weight:  [132.269 kg (291 lb 9.6 oz)] 132.269 kg (291 lb 9.6 oz) (10/07 0411) Weight change:  Last BM Date: 09/11/12  Intake/Output from previous day: 10/06 0701 - 10/07 0700 In: 600 [P.O.:600] Out: 3925 [Urine:3925]  PHYSICAL EXAM General appearance: alert and no distress Resp: clear to auscultation bilaterally Cardio: S1, S2 normal GI: soft, non-tender; bowel sounds normal; no masses,  no organomegaly Extremities: bilateral leg edema with weeping, blisters and ulceration  Lab Results:    @labtest @ [facility] No results found for this basename: PHART,PCO2,PO2ART,TCO2,HCO3 in the last 72 hours CULTURES No results found for this or any previous visit (from the past 240 hour(s)). Studies/Results: No results found.  Medications: I have reviewed the patient's current medications.  Assesment: 1.Bilateral leg edema. 2. Cellulitis of the legs  3. Chronic renal disease  4. Hypertension  5. COPD On oxygen  6. Sleep Apnea syndrome  7. Morbid hypothyroidism  8. Panhypopituitarism  9.S/p pituitary tumor resection  Active Problems:  * No active hospital problems. *     Plan: Continue diuretics Continue IV antibiotics As per Nephrology plan We will monitor BMP Discuss with case manager to refer him to LTAC to continue his treatment    LOS: 6 days   Meril Dray 09/13/2012, 7:53 AM

## 2012-09-13 NOTE — Consult Note (Signed)
WOC consult Note Reason for Consult: eval wound of the left LE, pt reports chronic edema of his bil. LE.  "Left not this bad before".  LE bil. Edema with faint pulses palpable per the bedside nurse.  He does have some surrounding cellulitis of the LLE. Both LE with edema and venous stasis appearance. Would need to confirm normal ABI, before providing compression therapy but pt does appear that this would be good treatment option for tx of current ulceration.  Concerned first to verify no arterial components to his ulceration with necrotic tissue and limited drainage Wound type: arterial vs. Venous ulceration Measurement:4.5cm x 2.5c, x 0.2cm  Wound bed: 75% yellow-black necrotic tissue, some pink tissue noted at wound proximal edges Drainage (amount, consistency, odor) minimal serous, no odor Periwound:circumfrencial erythema of the entire left calf. Dressing procedure/placement/frequency: will order enzymatic debridement to clean up this wound. Will need MD to order ABI, if they are greater than >0.8 would recommend order of Profore application to the LLE, then to be changed again on Thursday. WOC team will follow up on ABI results and order compression if safe after ABI obtained.  Discussed with pt and with bedside nursing.  WOC team will follow along with you for wound management Lenell Mcconnell Marlena Clipper, [employer] [phone]

## 2012-09-13 NOTE — Clinical Social Work Psychosocial (Signed)
Clinical Social Work Department BRIEF PSYCHOSOCIAL ASSESSMENT 09/13/2012  Patient:  [name] [name], [name] [name]     Account Number:  [id number]     Admit date:  09/07/2012  Clinical Social Worker:  Nancie Neas  Date/Time:  09/13/2012 03:50 PM  Referred by:  Care Management  Date Referred:  09/13/2012 Referred for  SNF Placement   Other Referral:   Interview type:  Patient Other interview type:    PSYCHOSOCIAL DATA Living Status:  ALONE Admitted from facility:   Level of care:   Primary support name:  [name] [name] Primary support relationship to patient:  CHILD, ADULT Degree of support available:   supportive per pt    CURRENT CONCERNS Current Concerns  Post-Acute Placement   Other Concerns:    SOCIAL WORK ASSESSMENT / PLAN CSW met with pt at bedside following referral from CM for SNF for IV antibiotics. Pt alert and oriented and reports he lives alone. He describes his daughter and sister as his best supports. Pt does not drive but says he pays friends to take him to appointments. CSW discussed placement process and pt is agreeable. [name] aware of SNF copays. Pt requests [place] or [place] placement. Pt does not feel he will be able to manage at home on IV antiobiotics.   Assessment/plan status:  Psychosocial Support/Ongoing Assessment of Needs Other assessment/ plan:   Information/referral to community resources:   SNF list    PATIENT'S/FAMILY'S RESPONSE TO PLAN OF CARE: Pt agreeable to [facility] for IV antibiotics. CSW will fax out FL2 and follow up with bed offers when available. CSW will also initiate [facility] referral.        Derenda Fennel, LCSW [phone]

## 2012-09-13 NOTE — Plan of Care (Signed)
Problem: Phase II Progression Outcomes Goal: Wound without signs/symptoms of infection, decreasing edema Outcome: Not Progressing Wound still continues to drain serous fluid.  New orders given for wound care consult.

## 2012-09-13 NOTE — Progress Notes (Signed)
[name] [name]  MRN: [id number]  DOB/AGE: [birth year] 66 y.o.  Primary Care Physician:[name] [name],TESFAYE, MD  Admit date: 09/07/2012  Chief Complaint:  Chief Complaint  Patient presents with  . Leg Swelling    S-Pt presented on  09/07/2012 with  Chief Complaint  Patient presents with  . Leg Swelling  . Pt feels better than before.Pt sitting out of bed today.   Meds     . albuterol  2 puff Inhalation TID  . atorvastatin  40 mg Oral q1800  . calcitRIOL  0.25 mcg Oral 3 times weekly  . desmopressin  0.2 mg Oral BID  . dexamethasone  0.5 mg Oral Daily  . enoxaparin (LOVENOX) injection  60 mg Subcutaneous Q24H  . hydrALAZINE  25 mg Oral BID  . levothyroxine  150 mcg Oral Daily  . phenytoin  200 mg Oral Custom   And  . phenytoin  300 mg Oral Custom  . sodium chloride      . torsemide  60 mg Oral Daily  . vancomycin  2,000 mg Intravenous Q24H       Physical Exam: Vital signs in last 24 hours: Temp:  [98.7 F (37.1 C)-99 F (37.2 C)] 99 F (37.2 C) (10/07 0411) Pulse Rate:  [86] 86  (10/07 0411) Resp:  [20] 20  (10/07 0411) BP: (136-156)/(59-69) 136/69 mmHg (10/07 0411) SpO2:  [98 %-99 %] 98 % (10/07 0758) Weight:  [291 lb 9.6 oz (132.269 kg)] 291 lb 9.6 oz (132.269 kg) (10/07 0411) Weight change:  Last BM Date: 09/11/12  Intake/Output from previous day: 10/06 0701 - 10/07 0700 In: 600 [P.O.:600] Out: 3925 [Urine:3925]     Physical Exam: General- pt is awake,alert, oriented to time place and person Resp- No acute REsp distress,  Rhonchi at Left base CVS- S1S2 regular in rate and rhythm GIT- BS+, soft, NT, ND EXT- 2+ LE Edema, NO Cyanosis   Lab Results: CBC  Basename 09/11/12 0611  WBC 9.7  HGB 15.6  HCT 48.5  PLT 135*    BMET  Basename 09/12/12 0717 09/11/12 0611  NA 141 138  K 4.7 4.6  CL 102 102  CO2 31 31  GLUCOSE 87 105*  BUN 34* 35*  CREATININE 2.15* 2.21*  CALCIUM 8.7 8.7    Creat  2013 2.5==>2.2 ==>2.15 2012 1.5--1.7  2011 1.79      Lab Results  Component Value Date   PTH 230.9* 09/09/2012   CALCIUM 8.7 09/12/2012   PHOS 3.1 09/10/2012     vanco 16.3   Impression: 1)Renal  AKI secondary to Prerenal / ATN vs CKD Progression  Creat trending down slowly CKD stage 3.  CKD since 2011 ( [place] [place] before that)  CKD secondary to HTN/ Low Glomerular mass( s/p nephrectomy)  Progression of CKD slow    2)HTN BP at goal Target Organ damage  CKD  Medication-  On Diuretics.  On Vasodilators.   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   4)CKD Mineral-Bone Disorder-  PTH HIgh Secondary Hyperparathyroidism Present.  Phosphorus at goal   5)REsp- COPd  stable   6)FEN  Normokalemic  NOrmonatremic   7)Acid base  Co2 at goal   8) ID Cellulitis  ON ABX  9) urology- pt with hx of nephrectomy and Now complex cyst in other kidney Agree with urology input.   Plan:  Will continue current plan of diuresis. Will follow Bmet in am       [name] [name] S 09/13/2012, 9:30 AM

## 2012-09-13 NOTE — Clinical Social Work Placement (Signed)
Clinical Social Work Department CLINICAL SOCIAL WORK PLACEMENT NOTE 09/13/2012  Patient:  [name] [name], [name] [name]  Account Number:  [id number] Admit date:  09/07/2012  Clinical Social Worker:  Derenda Fennel, LCSW  Date/time:  09/13/2012 03:45 PM  Clinical Social Work is seeking post-discharge placement for this patient at the following level of care:   SKILLED NURSING   (*CSW will update this form in Epic as items are completed)   09/13/2012  Patient/family provided with [facility] Department of Clinical Social Work's list of facilities offering this level of care within the geographic area requested by the patient (or if unable, by the patient's family).  09/13/2012  Patient/family informed of their freedom to choose among providers that offer the needed level of care, that participate in Medicare, Medicaid or managed care program needed by the patient, have an available bed and are willing to accept the patient.  09/13/2012  Patient/family informed of [facility]' ownership interest in [facility], as well as of the fact that they are under no obligation to receive care at this facility.  PASARR submitted to EDS on 09/13/2012 PASARR number received from EDS on 09/13/2012  FL2 transmitted to all facilities in geographic area requested by pt/family on  09/13/2012 FL2 transmitted to all facilities within larger geographic area on 09/13/2012  Patient informed that his/her managed care company has contracts with or will negotiate with  certain facilities, including the following:     Patient/family informed of bed offers received:   Patient chooses bed at  Physician recommends and patient chooses bed at    Patient to be transferred to  on   Patient to be transferred to facility by   The following physician request were entered in Epic:   Additional Comments:  Derenda Fennel, LCSW [phone]

## 2012-09-13 NOTE — Progress Notes (Signed)
ANTIBIOTIC CONSULT NOTE   Pharmacy Consult for Vancomycin Indication: cellulitis  No Known Allergies  Patient Measurements: Height: 6\' 1"  (185.4 cm) Weight: 291 lb 9.6 oz (132.269 kg) IBW/kg (Calculated) : 79.9   Vital Signs: Temp: 99 F (37.2 C) (10/07 0411) Temp src: Oral (10/07 0411) BP: 136/69 mmHg (10/07 0411) Pulse Rate: 86  (10/07 0411) Intake/Output from previous day: 10/06 0701 - 10/07 0700 In: 600 [P.O.:600] Out: 3925 [Urine:3925] Intake/Output from this shift:    Labs:  Basename 09/12/12 0717 09/11/12 0611  WBC -- 9.7  HGB -- 15.6  PLT -- 135*  LABCREA -- --  CREATININE 2.15* 2.21*   Estimated Creatinine Clearance: 47.6 ml/min (by C-G formula based on Cr of 2.15).  Basename 09/10/12 0845  VANCOTROUGH 16.3  VANCOPEAK --  VANCORANDOM --  GENTTROUGH --  GENTPEAK --  GENTRANDOM --  TOBRATROUGH --  TOBRAPEAK --  TOBRARND --  AMIKACINPEAK --  AMIKACINTROU --  AMIKACIN --    Microbiology: No results found for this or any previous visit (from the past 720 hour(s)).  Medical History: Past Medical History  Diagnosis Date  . Chronic bronchitis   . Hypertension    Medications:  Scheduled:     . albuterol  2 puff Inhalation TID  . atorvastatin  40 mg Oral q1800  . calcitRIOL  0.25 mcg Oral 3 times weekly  . desmopressin  0.2 mg Oral BID  . dexamethasone  0.5 mg Oral Daily  . enoxaparin (LOVENOX) injection  60 mg Subcutaneous Q24H  . hydrALAZINE  25 mg Oral BID  . levothyroxine  150 mcg Oral Daily  . phenytoin  200 mg Oral Custom   And  . phenytoin  300 mg Oral Custom  . sodium chloride      . torsemide  60 mg Oral Daily  . vancomycin  2,000 mg Intravenous Q24H   Assessment: 66 yo M admitted with HF exacerbation & cellulitis. Scr elevated but trough level on target at high end of range.   Goal of Therapy:  Vancomycin trough level 10-15 mcg/ml  Plan:  1) continue Vancomycin 2gm IV Q24h 2) Check Vancomycin trough weekly 3) Monitor  renal function and cx data per protocol 4) Duration of ABX per MD  Valrie Hart A 09/13/2012,8:18 AM

## 2012-09-14 ENCOUNTER — Encounter (HOSPITAL_COMMUNITY): Payer: Medicare Other

## 2012-09-14 ENCOUNTER — Inpatient Hospital Stay (HOSPITAL_COMMUNITY): Payer: Medicare Other

## 2012-09-14 LAB — BASIC METABOLIC PANEL
BUN: 36 mg/dL — ABNORMAL HIGH (ref 6–23)
CO2: 33 mEq/L — ABNORMAL HIGH (ref 19–32)
Calcium: 9.3 mg/dL (ref 8.4–10.5)
Chloride: 98 mEq/L (ref 96–112)
Creatinine, Ser: 2.34 mg/dL — ABNORMAL HIGH (ref 0.50–1.35)
GFR calc Af Amer: 31 mL/min — ABNORMAL LOW (ref >90)
GFR calc non Af Amer: 27 mL/min — ABNORMAL LOW (ref >90)
Glucose, Bld: 77 mg/dL (ref 70–99)
Potassium: 4.3 mEq/L (ref 3.5–5.1)
Sodium: 138 mEq/L (ref 135–145)

## 2012-09-14 MED ORDER — SODIUM CHLORIDE 0.9 % IJ SOLN
10.0000 mL | Freq: Two times a day (BID) | INTRAMUSCULAR | Status: DC
  Administered 2012-09-14 – 2012-09-15 (×3): 10 mL
  Filled 2012-09-14: qty 3

## 2012-09-14 MED ORDER — ALBUTEROL SULFATE HFA 108 (90 BASE) MCG/ACT IN AERS
2.0000 | INHALATION_SPRAY | Freq: Two times a day (BID) | RESPIRATORY_TRACT | Status: DC
  Administered 2012-09-15: 2 via RESPIRATORY_TRACT

## 2012-09-14 MED ORDER — SODIUM CHLORIDE 0.9 % IJ SOLN
10.0000 mL | INTRAMUSCULAR | Status: DC | PRN
  Filled 2012-09-14: qty 3

## 2012-09-14 MED ORDER — TORSEMIDE 20 MG PO TABS
40.0000 mg | ORAL_TABLET | Freq: Every day | ORAL | Status: DC
  Administered 2012-09-14 – 2012-09-15 (×2): 40 mg via ORAL
  Filled 2012-09-14 (×2): qty 2

## 2012-09-14 NOTE — Progress Notes (Signed)
UR Chart Review Completed  

## 2012-09-14 NOTE — Clinical Social Work Placement (Signed)
Clinical Social Work Department CLINICAL SOCIAL WORK PLACEMENT NOTE 09/14/2012  Patient:  [name] [name], [name] [name]  Account Number:  [id number] Admit date:  09/07/2012  Clinical Social Worker:  Derenda Fennel, LCSW  Date/time:  09/13/2012 03:45 PM  Clinical Social Work is seeking post-discharge placement for this patient at the following level of care:   SKILLED NURSING   (*CSW will update this form in Epic as items are completed)   09/13/2012  Patient/family provided with [facility] Department of Clinical Social Work's list of facilities offering this level of care within the geographic area requested by the patient (or if unable, by the patient's family).  09/13/2012  Patient/family informed of their freedom to choose among providers that offer the needed level of care, that participate in Medicare, Medicaid or managed care program needed by the patient, have an available bed and are willing to accept the patient.  09/13/2012  Patient/family informed of [facility]' ownership interest in [facility], as well as of the fact that they are under no obligation to receive care at this facility.  PASARR submitted to EDS on 09/13/2012 PASARR number received from EDS on 09/13/2012  FL2 transmitted to all facilities in geographic area requested by pt/family on  09/13/2012 FL2 transmitted to all facilities within larger geographic area on 09/13/2012  Patient informed that his/her managed care company has contracts with or will negotiate with  certain facilities, including the following:     Patient/family informed of bed offers received:  09/14/2012 Patient chooses bed at [facility] Physician recommends and patient chooses bed at  [facility]  Patient to be transferred to  on   Patient to be transferred to facility by   The following physician request were entered in Epic:   Additional Comments:  Derenda Fennel, LCSW [phone]

## 2012-09-14 NOTE — Progress Notes (Signed)
[name] [name]  MRN: [id number]  DOB/AGE: 1946-04-14 66 y.o.  Primary Care Physician:FANTA,TESFAYE, MD  Admit date: 09/07/2012  Chief Complaint:  Chief Complaint  Patient presents with  . Leg Swelling    S-Pt presented on  09/07/2012 with  Chief Complaint  Patient presents with  . Leg Swelling  . Pt feels better.  Meds     . albuterol  2 puff Inhalation TID  . atorvastatin  40 mg Oral q1800  . calcitRIOL  0.25 mcg Oral 3 times weekly  . collagenase   Topical Daily  . desmopressin  0.2 mg Oral BID  . dexamethasone  0.5 mg Oral Daily  . enoxaparin (LOVENOX) injection  60 mg Subcutaneous Q24H  . hydrALAZINE  25 mg Oral BID  . levothyroxine  150 mcg Oral Daily  . phenytoin  200 mg Oral Custom   And  . phenytoin  300 mg Oral Custom  . torsemide  60 mg Oral Daily  . vancomycin  2,000 mg Intravenous Q24H       Physical Exam: Vital signs in last 24 hours: Temp:  [98.3 F (36.8 C)-98.6 F (37 C)] 98.6 F (37 C) (10/08 4098) Pulse Rate:  [83-89] 83  (10/08 0608) Resp:  [20] 20  (10/08 0608) BP: (133-151)/(55-67) 135/55 mmHg (10/08 0608) SpO2:  [91 %-98 %] 98 % (10/08 0737) Weight change:  Last BM Date: 09/13/12  Intake/Output from previous day: 10/07 0701 - 10/08 0700 In: 720 [P.O.:720] Out: 3200 [Urine:3200]     Physical Exam: General- pt is awake,alert, oriented to time place and person Resp- No acute REsp distress,  Chest clear to auscultation CVS- S1S2 regular in rate and rhythm GIT- BS+, soft, NT, ND EXT- 2+ LE Edema, NO Cyanosis   Lab Results:  BMET  [facility] 09/14/12 0534 09/12/12 0717  NA 138 141  K 4.3 4.7  CL 98 102  CO2 33* 31  GLUCOSE 77 87  BUN 36* 34*  CREATININE 2.34* 2.15*  CALCIUM 9.3 8.7    Creat  2013 2.5==>2.2 ==>2.15==>2.3 2012 1.5--1.7  2011 1.79     Lab Results  Component Value Date   PTH 230.9* 09/09/2012   CALCIUM 9.3 09/14/2012   PHOS 3.1 09/10/2012     vanco 16.3   Impression: 1)Renal  AKI secondary  to Prerenal / ATN vs CKD Progression  Creat now 2.3 CKD stage 3.  CKD since 2011 ( [place] before that)  CKD secondary to HTN/ Low Glomerular mass( s/p nephrectomy)  Progression of CKD slow    2)HTN BP at goal Target Organ damage  CKD  Medication-  On Diuretics.  On Vasodilators.   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   4)CKD Mineral-Bone Disorder-  PTH HIgh Secondary Hyperparathyroidism Present.  Phosphorus at goal   5)REsp- COPd  stable   6)FEN  Normokalemic  NOrmonatremic   7)Acid base  Co2 at goal   8) ID Cellulitis  ON ABX    Plan:  Will reduce diuretics Will follow Bmet in am       [name] S 09/14/2012, 9:00 AM

## 2012-09-14 NOTE — Progress Notes (Signed)
Subjective: Patient is is receiving Iv antibiotics and wound care. His leg edema is still weeping. He has no insurance coverage for [employer] Objective: Vital signs in last 24 hours: Temp:  [98.3 F (36.8 C)-98.6 F (37 C)] 98.6 F (37 C) (10/08 4098) Pulse Rate:  [83-89] 83  (10/08 0608) Resp:  [20] 20  (10/08 0608) BP: (133-151)/(55-67) 135/55 mmHg (10/08 0608) SpO2:  [91 %-98 %] 98 % (10/08 0737) Weight change:  Last BM Date: 09/13/12  Intake/Output from previous day: 10/07 0701 - 10/08 0700 In: 720 [P.O.:720] Out: 3200 [Urine:3200]  PHYSICAL EXAM General appearance: alert and no distress Resp: clear to auscultation bilaterally Cardio: S1, S2 normal GI: soft, non-tender; bowel sounds normal; no masses,  no organomegaly Extremities: bilateral leg edema with weeping, blisters and ulceration  Lab Results:    @labtest @ ABGS No results found for this basename: PHART,PCO2,PO2ART,TCO2,HCO3 in the last 72 hours CULTURES No results found for this or any previous visit (from the past 240 hour(s)). Studies/Results: No results found.  Medications: I have reviewed the patient's current medications.  Assesment: 1.Bilateral leg edema. 2. Cellulitis of the legs  3. Chronic renal disease  4. Hypertension  5. COPD On oxygen  6. Sleep Apnea syndrome  7. Morbid hypothyroidism  8. Panhypopituitarism  9.S/p pituitary tumor resection  Active Problems:  * No active hospital problems. *     Plan: Continue diuretics Continue IV antibiotics As per Nephrology plan We will monitor BMP We will do arterial doppler for ABI We will get picc line and place him in nursing home.    LOS: 7 days   Evelyne Makepeace 09/14/2012, 8:01 AM

## 2012-09-14 NOTE — Clinical Social Work Note (Signed)
CSW presented bed offers and pt and daughter choose [facility]. Facility notified as well as [employer]. D/C tomorrow to SNF per MD.  Derenda Fennel, LCSW [phone]

## 2012-09-15 LAB — BASIC METABOLIC PANEL
BUN: 34 mg/dL — ABNORMAL HIGH (ref 6–23)
Calcium: 9 mg/dL (ref 8.4–10.5)
GFR calc Af Amer: 34 mL/min — ABNORMAL LOW (ref >90)
GFR calc non Af Amer: 29 mL/min — ABNORMAL LOW (ref >90)
Glucose, Bld: 88 mg/dL (ref 70–99)
Potassium: 4.2 mEq/L (ref 3.5–5.1)
Sodium: 135 mEq/L (ref 135–145)

## 2012-09-15 MED ORDER — CALCITRIOL 0.25 MCG PO CAPS: 0.2500 ug | ORAL_CAPSULE | ORAL | Status: DC

## 2012-09-15 MED ORDER — VANCOMYCIN HCL 1000 MG IV SOLR: 2000.0000 mg | INTRAVENOUS | Status: DC

## 2012-09-15 MED ORDER — COLLAGENASE 250 UNIT/GM EX OINT: TOPICAL_OINTMENT | Freq: Every day | CUTANEOUS | Status: DC

## 2012-09-15 MED ORDER — TORSEMIDE 20 MG PO TABS: 40.0000 mg | ORAL_TABLET | Freq: Every day | ORAL | Status: DC

## 2012-09-15 NOTE — Progress Notes (Signed)
Report called to the nurse at the [facility].  Pt is A&O and is aware of d/c plan.  Pt is currently getting washed up and dressed and his ride will be here between 12 and 12:30 to transport him.

## 2012-09-15 NOTE — Clinical Social Work Placement (Signed)
Clinical Social Work Department CLINICAL SOCIAL WORK PLACEMENT NOTE 09/15/2012  Patient:  [name] [name], [name] [name]  Account Number:  [id number] Admit date:  09/07/2012  Clinical Social Worker:  Derenda Fennel, LCSW  Date/time:  09/13/2012 03:45 PM  Clinical Social Work is seeking post-discharge placement for this patient at the following level of care:   SKILLED NURSING   (*CSW will update this form in Epic as items are completed)   09/13/2012  Patient/family provided with [facility] Department of Clinical Social Work's list of facilities offering this level of care within the geographic area requested by the patient (or if unable, by the patient's family).  09/13/2012  Patient/family informed of their freedom to choose among providers that offer the needed level of care, that participate in Medicare, Medicaid or managed care program needed by the patient, have an available bed and are willing to accept the patient.  09/13/2012  Patient/family informed of [facility]' ownership interest in [facility], as well as of the fact that they are under no obligation to receive care at this facility.  PASARR submitted to EDS on 09/13/2012 PASARR number received from EDS on 09/13/2012  FL2 transmitted to all facilities in geographic area requested by pt/family on  09/13/2012 FL2 transmitted to all facilities within larger geographic area on 09/13/2012  Patient informed that his/her managed care company has contracts with or will negotiate with  certain facilities, including the following:     Patient/family informed of bed offers received:  09/14/2012 Patient chooses bed at [facility] Physician recommends and patient chooses bed at  [facility]  Patient to be transferred to [facility] on  09/15/2012 Patient to be transferred to facility by facility Zenaida Niece  The following physician request were entered in Epic:   Additional Comments:  Derenda Fennel, LCSW [phone]

## 2012-09-15 NOTE — Progress Notes (Signed)
ANTICOAGULATION CONSULT NOTE  Pharmacy Consult for Lovenox Indication: VTE prophylaxis  No Known Allergies  Patient Measurements: Height: 6\' 1"  (185.4 cm) Weight: 286 lb 9.6 oz (130.001 kg) IBW/kg (Calculated) : 79.9   Vital Signs: Temp: 97.9 F (36.6 C) (10/09 0633) Temp src: Oral (10/09 0633) BP: 127/69 mmHg (10/09 5784) Pulse Rate: 87  (10/09 0633)  Labs:  Basename 09/15/12 0540 09/14/12 0534  HGB -- --  HCT -- --  PLT -- --  APTT -- --  LABPROT -- --  INR -- --  HEPARINUNFRC -- --  CREATININE 2.22* 2.34*  CKTOTAL -- --  CKMB -- --  TROPONINI -- --    Estimated Creatinine Clearance: 45.6 ml/min (by C-G formula based on Cr of 2.22).   Medical History: Past Medical History  Diagnosis Date  . Chronic bronchitis   . Hypertension     Medications:  Scheduled:     . albuterol  2 puff Inhalation BID  . atorvastatin  40 mg Oral q1800  . calcitRIOL  0.25 mcg Oral 3 times weekly  . collagenase   Topical Daily  . desmopressin  0.2 mg Oral BID  . dexamethasone  0.5 mg Oral Daily  . enoxaparin (LOVENOX) injection  60 mg Subcutaneous Q24H  . hydrALAZINE  25 mg Oral BID  . levothyroxine  150 mcg Oral Daily  . phenytoin  200 mg Oral Custom   And  . phenytoin  300 mg Oral Custom  . sodium chloride  10-40 mL Intracatheter Q12H  . torsemide  40 mg Oral Daily  . vancomycin  2,000 mg Intravenous Q24H  . DISCONTD: albuterol  2 puff Inhalation TID  . DISCONTD: torsemide  60 mg Oral Daily    Assessment: 66 yo obese M admitted with HF exacerbation on Lovenox for VTE px while hospitalized.  No bleeding noted. CBC & renal function stable since admission.   Goal of Therapy:  Monitor platelets by anticoagulation protocol: Yes   Plan:  Lovenox 60mg  sq Q24h (~0.5mg /kg/day) Pharmacy to sign off. Please re-consult as needed  [name] [name], [facility] 09/15/2012,7:55 AM

## 2012-09-15 NOTE — Clinical Social Work Note (Addendum)
Pt d/c today to [facility]. Facility will call to get [facility] authorization number and will transport pt. Aware of oxygen.  Pt aware and agreeable. D/C summary faxed.  Derenda Fennel, [place] [phone]

## 2012-09-15 NOTE — Discharge Summary (Signed)
Physician Discharge Summary  Patient ID: [name] [name] MRN: [id number] DOB/AGE: [birth year] 66 y.o. Primary Care Physician:Leyton Magoon, MD Admit date: 09/07/2012 Discharge date: 09/15/2012    Discharge Diagnoses:  .Bilateral leg edema.  2. Cellulitis of the legs  3. Chronic renal disease  4. Hypertension  5. COPD On oxygen  6. Sleep Apnea syndrome  7. Morbid hypothyroidism  8. Panhypopituitarism  9.S/p pituitary tumor resection  Active Problems:  * No active hospital problems. *      Medication List     As of 09/15/2012  7:55 AM    STOP taking these medications         olmesartan 40 MG tablet   Commonly known as: BENICAR      TAKE these medications         albuterol 108 (90 BASE) MCG/ACT inhaler   Commonly known as: PROVENTIL HFA;VENTOLIN HFA   Inhale 2 puffs into the lungs every 6 (six) hours as needed. Bronchitis      calcitRIOL 0.25 MCG capsule   Commonly known as: ROCALTROL   Take 1 capsule (0.25 mcg total) by mouth 3 (three) times a week.      collagenase ointment   Commonly known as: SANTYL   Apply topically daily.      desmopressin 0.2 MG tablet   Commonly known as: DDAVP   Take 0.2 mg by mouth 2 (two) times daily.      dexamethasone 0.5 MG tablet   Commonly known as: DECADRON   Take 0.5 mg by mouth daily.      hydrALAZINE 25 MG tablet   Commonly known as: APRESOLINE   Take 25 mg by mouth 2 (two) times daily.      levothyroxine 150 MCG tablet   Commonly known as: SYNTHROID, LEVOTHROID   Take 150 mcg by mouth daily.      phenytoin 100 MG ER capsule   Commonly known as: DILANTIN   Take 200-300 mg by mouth daily. Takes 20 mg on Monday and Thursdsay. On all other days of the week takes 300 mg.      simvastatin 80 MG tablet   Commonly known as: ZOCOR   Take 80 mg by mouth at bedtime.      sodium chloride 0.9 % SOLN 500 mL with vancomycin 1000 MG SOLR 2,000 mg   Inject 2,000 mg into the vein daily.      torsemide 20 MG tablet   Commonly  known as: DEMADEX   Take 2 tablets (40 mg total) by mouth daily.        Discharged Condition:*stable   Consults:*nephrology Significant Diagnostic Studies: US Renal  09/09/2012  *RADIOLOGY REPORT*  Clinical Data: Status post right nephrectomy, elevated BUN/creatinine  RENAL/URINARY TRACT ULTRASOUND COMPLETE  Comparison:  MRI abdomen dated 01/08/2009. Renal ultrasound dated 12/28/2008.  Findings:  Right Kidney:  Surgically absent.  Left Kidney:  Measures 12.0 cm.  Increased parenchymal echogenicity, suggesting medical renal disease.  1.8 x 1.7 x 2.1 cm complex upper pole lesion which cannot be characterized as a simple cyst, although grossly unchanged from 2010. Additional 1.6 x 1.4 x 1.6 in the interpolar cyst.  No hydronephrosis.  Bladder:  Underdistended.  IMPRESSION: Right kidney is surgically absent.  2.1 cm complex left upper pole lesion which cannot be characterized as a simple cyst, although grossly unchanged from 2010.  Consider MRI abdomen (with half dose contrast if GFR remains > 30) for further characterization.  No hydronephrosis.   Original Report Authenticated By: Charline Bills,  M.D.    US Arterial Seg Single  09/14/2012  *RADIOLOGY REPORT*  Clinical Data: Leg swelling, nonhealing left leg wound.  Previous tobacco use, hypertension.  ULTRASOUND ANKLE/BRACHIAL INDICES BILATERAL  Comparison: None.  Findings: At rest, the right ABI is 1.25, left 1.23.  Biphasic wave forms recorded distally in both lower extremities.  IMPRESSION:  1.  No evidence of hemodynamically significant lower extremity arterial occlusive disease at rest.   Original Report Authenticated By: Osa Craver, M.D.    Dg Chest Port 1 View  09/14/2012  *RADIOLOGY REPORT*  Clinical Data: PICC line placement  PORTABLE CHEST - 1 VIEW  Comparison: Portable exam 1101 hours compared to 09/07/2012  Findings: Tip of right arm PICC line projects over the proximal SVC. Enlargement of cardiac silhouette. Tortuous aorta.  Prominent hila and central pulmonary arteries unchanged. Low lung volumes with bibasilar atelectasis and crowding of perihilar markings. No definite pleural effusion or pneumothorax.  IMPRESSION: Tip of right arm PICC line projects over proximal SVC. Enlargement of cardiac silhouette. Low lung volumes with bibasilar atelectasis.   Original Report Authenticated By: Lollie Marrow, M.D.    Dg Chest Port 1 View  09/07/2012  *RADIOLOGY REPORT*  Clinical Data: Shortness of breath and leg swelling.  PORTABLE CHEST - 1 VIEW  Comparison: 06/01/2012  Findings: Moderate cardiomegaly. No pleural effusion or pneumothorax.  Pulmonary artery enlargement is again identified. Mild pulmonary venous congestion. No lobar consolidation.  IMPRESSION: Cardiomegaly with development of mild pulmonary venous congestion.  Pulmonary artery enlargement suggests pulmonary arterial hypertension.   Original Report Authenticated By: Consuello Bossier, M.D.     Lab Results: Basic Metabolic Panel:  Basename 09/15/12 0540 09/14/12 0534  NA 135 138  K 4.2 4.3  CL 96 98  CO2 30 33*  GLUCOSE 88 77  BUN 34* 36*  CREATININE 2.22* 2.34*  CALCIUM 9.0 9.3  MG -- --  PHOS -- --   Liver Function Tests: No results found for this basename: AST:2,ALT:2,ALKPHOS:2,BILITOT:2,PROT:2,ALBUMIN:2 in the last 72 hours   CBC: No results found for this basename: WBC:2,NEUTROABS:2,HGB:2,HCT:2,MCV:2,PLT:2 in the last 72 hours  No results found for this or any previous visit (from the past 240 hour(s)).   Hospital Course: * This is a 66 years old male patient with history of multiple medical illnesses was admitted due to bilateral leg edema with ulcerations. He was start on IV antibiotics for cellulitis. Nephrology consult was done for acute on chronic renal failure. He was started on IV diuretics and later changed to oral diuretics. His Echo was normal except for LVH. He has also normal arterial doppler. Patient is being discharged to nursing home  to continue IV antibiotics and wound care. His vancomycin level and renal function needs to be monitored closely. Discharge Exam: Blood pressure 127/69, pulse 87, temperature 97.9 F (36.6 C), temperature source Oral, resp. rate 20, height 6\' 1"  (1.854 m), weight 130.001 kg (286 lb 9.6 oz), SpO2 99.00%. * Disposition: *nursing home     Signed: Joshia Kitchings   09/15/2012, 7:55 AM

## 2012-09-26 ENCOUNTER — Inpatient Hospital Stay (HOSPITAL_COMMUNITY)
Admission: EM | Admit: 2012-09-26 | Discharge: 2012-10-01 | DRG: 190 | Disposition: A | Discharge status: Home Health | Payer: Medicare Other | Attending: Internal Medicine | Admitting: Internal Medicine

## 2012-09-26 ENCOUNTER — Encounter (HOSPITAL_COMMUNITY): Payer: Self-pay

## 2012-09-26 ENCOUNTER — Emergency Department (HOSPITAL_COMMUNITY): Payer: Medicare Other

## 2012-09-26 DIAGNOSIS — E893 Postprocedural hypopituitarism: Secondary | ICD-10-CM | POA: Diagnosis present

## 2012-09-26 DIAGNOSIS — G4733 Obstructive sleep apnea (adult) (pediatric): Secondary | ICD-10-CM | POA: Diagnosis present

## 2012-09-26 DIAGNOSIS — N189 Chronic kidney disease, unspecified: Secondary | ICD-10-CM | POA: Diagnosis present

## 2012-09-26 DIAGNOSIS — Z9981 Dependence on supplemental oxygen: Secondary | ICD-10-CM

## 2012-09-26 DIAGNOSIS — J9600 Acute respiratory failure, unspecified whether with hypoxia or hypercapnia: Secondary | ICD-10-CM | POA: Diagnosis present

## 2012-09-26 DIAGNOSIS — I129 Hypertensive chronic kidney disease with stage 1 through stage 4 chronic kidney disease, or unspecified chronic kidney disease: Secondary | ICD-10-CM | POA: Diagnosis present

## 2012-09-26 DIAGNOSIS — J209 Acute bronchitis, unspecified: Principal | ICD-10-CM | POA: Diagnosis present

## 2012-09-26 DIAGNOSIS — L02419 Cutaneous abscess of limb, unspecified: Secondary | ICD-10-CM | POA: Diagnosis present

## 2012-09-26 DIAGNOSIS — E039 Hypothyroidism, unspecified: Secondary | ICD-10-CM | POA: Diagnosis present

## 2012-09-26 DIAGNOSIS — R0600 Dyspnea, unspecified: Secondary | ICD-10-CM

## 2012-09-26 DIAGNOSIS — J440 Chronic obstructive pulmonary disease with acute lower respiratory infection: Principal | ICD-10-CM | POA: Diagnosis present

## 2012-09-26 DIAGNOSIS — Z79899 Other long term (current) drug therapy: Secondary | ICD-10-CM

## 2012-09-26 DIAGNOSIS — L03119 Cellulitis of unspecified part of limb: Secondary | ICD-10-CM | POA: Diagnosis present

## 2012-09-26 DIAGNOSIS — J441 Chronic obstructive pulmonary disease with (acute) exacerbation: Secondary | ICD-10-CM

## 2012-09-26 DIAGNOSIS — IMO0002 Reserved for concepts with insufficient information to code with codable children: Secondary | ICD-10-CM

## 2012-09-26 DIAGNOSIS — Y838 Other surgical procedures as the cause of abnormal reaction of the patient, or of later complication, without mention of misadventure at the time of the procedure: Secondary | ICD-10-CM | POA: Diagnosis present

## 2012-09-26 DIAGNOSIS — Z87891 Personal history of nicotine dependence: Secondary | ICD-10-CM

## 2012-09-26 DIAGNOSIS — Z6837 Body mass index (BMI) 37.0-37.9, adult: Secondary | ICD-10-CM

## 2012-09-26 DIAGNOSIS — E236 Other disorders of pituitary gland: Secondary | ICD-10-CM | POA: Diagnosis present

## 2012-09-26 HISTORY — DX: Sleep apnea, unspecified: G47.30

## 2012-09-26 HISTORY — DX: Cellulitis of unspecified part of limb: L03.119

## 2012-09-26 LAB — BLOOD GAS, ARTERIAL
Acid-Base Excess: 1.4 mmol/L (ref 0.0–2.0)
TCO2: 22.8 mmol/L (ref 0–100)
pCO2 arterial: 48.1 mmHg — ABNORMAL HIGH (ref 35.0–45.0)
pH, Arterial: 7.357 (ref 7.350–7.450)
pO2, Arterial: 112 mmHg — ABNORMAL HIGH (ref 80.0–100.0)

## 2012-09-26 LAB — TROPONIN I: Troponin I: <0.3 ng/mL (ref <0.30)

## 2012-09-26 LAB — CBC WITH DIFFERENTIAL/PLATELET
Basophils Absolute: 0.1 10*3/uL (ref 0.0–0.1)
Basophils Relative: 1 % (ref 0–1)
Eosinophils Absolute: 0.9 10*3/uL — ABNORMAL HIGH (ref 0.0–0.7)
Eosinophils Relative: 12 % — ABNORMAL HIGH (ref 0–5)
HCT: 44.1 % (ref 39.0–52.0)
Lymphs Abs: 1 10*3/uL (ref 0.7–4.0)
MCH: 29.6 pg (ref 26.0–34.0)
MCHC: 31.7 g/dL (ref 30.0–36.0)
MCV: 93.2 fL (ref 78.0–100.0)
Monocytes Absolute: 0.8 10*3/uL (ref 0.1–1.0)
Monocytes Relative: 11 % (ref 3–12)
Neutro Abs: 4.9 10*3/uL (ref 1.7–7.7)
Neutrophils Relative %: 64 % (ref 43–77)
RDW: 15 % (ref 11.5–15.5)

## 2012-09-26 LAB — BASIC METABOLIC PANEL
BUN: 26 mg/dL — ABNORMAL HIGH (ref 6–23)
Chloride: 102 mEq/L (ref 96–112)
Creatinine, Ser: 2.22 mg/dL — ABNORMAL HIGH (ref 0.50–1.35)
GFR calc Af Amer: 34 mL/min — ABNORMAL LOW (ref >90)
Glucose, Bld: 110 mg/dL — ABNORMAL HIGH (ref 70–99)

## 2012-09-26 LAB — MRSA PCR SCREENING: MRSA by PCR: POSITIVE — AB

## 2012-09-26 MED ORDER — VANCOMYCIN HCL 1000 MG IV SOLR
2000.0000 mg | INTRAVENOUS | Status: DC
  Administered 2012-09-27: 2000 mg via INTRAVENOUS
  Filled 2012-09-26: qty 2000

## 2012-09-26 MED ORDER — CALCITRIOL 0.25 MCG PO CAPS
0.2500 ug | ORAL_CAPSULE | ORAL | Status: DC
  Administered 2012-09-27 – 2012-10-01 (×3): 0.25 ug via ORAL
  Filled 2012-09-26 (×4): qty 1

## 2012-09-26 MED ORDER — DESMOPRESSIN ACETATE 0.2 MG PO TABS
0.2000 mg | ORAL_TABLET | Freq: Two times a day (BID) | ORAL | Status: DC
  Administered 2012-09-26 – 2012-10-01 (×10): 0.2 mg via ORAL
  Filled 2012-09-26 (×10): qty 1

## 2012-09-26 MED ORDER — LEVOTHYROXINE SODIUM 75 MCG PO TABS
150.0000 ug | ORAL_TABLET | Freq: Every day | ORAL | Status: DC
  Administered 2012-09-27 – 2012-10-01 (×5): 150 ug via ORAL
  Filled 2012-09-26 (×3): qty 2
  Filled 2012-09-26 (×4): qty 1

## 2012-09-26 MED ORDER — PHENYTOIN SODIUM EXTENDED 100 MG PO CAPS
200.0000 mg | ORAL_CAPSULE | ORAL | Status: DC
  Administered 2012-09-27 – 2012-09-30 (×2): 200 mg via ORAL
  Filled 2012-09-26: qty 1
  Filled 2012-09-26: qty 2
  Filled 2012-09-26: qty 1
  Filled 2012-09-26: qty 2

## 2012-09-26 MED ORDER — ALBUTEROL SULFATE (5 MG/ML) 0.5% IN NEBU
2.5000 mg | INHALATION_SOLUTION | RESPIRATORY_TRACT | Status: DC
  Administered 2012-09-26: 2.5 mg via RESPIRATORY_TRACT
  Filled 2012-09-26: qty 0.5

## 2012-09-26 MED ORDER — SODIUM CHLORIDE 0.9 % IV SOLN
INTRAVENOUS | Status: DC
  Administered 2012-09-26: 15:00:00 via INTRAVENOUS

## 2012-09-26 MED ORDER — METHYLPREDNISOLONE SODIUM SUCC 125 MG IJ SOLR
125.0000 mg | Freq: Once | INTRAMUSCULAR | Status: AC
  Administered 2012-09-26: 125 mg via INTRAVENOUS
  Filled 2012-09-26: qty 2

## 2012-09-26 MED ORDER — PHENYTOIN SODIUM EXTENDED 100 MG PO CAPS
300.0000 mg | ORAL_CAPSULE | ORAL | Status: DC
  Administered 2012-09-28 – 2012-10-01 (×3): 300 mg via ORAL
  Filled 2012-09-26: qty 3
  Filled 2012-09-26 (×3): qty 1

## 2012-09-26 MED ORDER — CHLORHEXIDINE GLUCONATE CLOTH 2 % EX PADS
6.0000 | MEDICATED_PAD | Freq: Every day | CUTANEOUS | Status: AC
  Administered 2012-09-28 – 2012-10-01 (×4): 6 via TOPICAL

## 2012-09-26 MED ORDER — IPRATROPIUM BROMIDE 0.02 % IN SOLN
1.0000 mg | Freq: Once | RESPIRATORY_TRACT | Status: AC
  Administered 2012-09-26: 0.5 mg via RESPIRATORY_TRACT

## 2012-09-26 MED ORDER — IPRATROPIUM BROMIDE 0.02 % IN SOLN
RESPIRATORY_TRACT | Status: AC
  Administered 2012-09-26: 0.5 mg via RESPIRATORY_TRACT
  Filled 2012-09-26: qty 2.5

## 2012-09-26 MED ORDER — TORSEMIDE 20 MG PO TABS
40.0000 mg | ORAL_TABLET | Freq: Every day | ORAL | Status: DC
  Administered 2012-09-27 – 2012-09-29 (×3): 40 mg via ORAL
  Filled 2012-09-26 (×4): qty 2

## 2012-09-26 MED ORDER — SODIUM CHLORIDE 0.9 % IV SOLN
INTRAVENOUS | Status: DC
  Administered 2012-09-26 – 2012-09-27 (×2): 20 mL/h via INTRAVENOUS
  Administered 2012-09-28: 23:00:00 via INTRAVENOUS

## 2012-09-26 MED ORDER — VANCOMYCIN HCL IN DEXTROSE 1-5 GM/200ML-% IV SOLN
INTRAVENOUS | Status: AC
  Filled 2012-09-26: qty 400

## 2012-09-26 MED ORDER — HYDROMORPHONE HCL PF 1 MG/ML IJ SOLN
1.0000 mg | INTRAMUSCULAR | Status: DC | PRN
  Administered 2012-09-26 – 2012-09-28 (×4): 1 mg via INTRAVENOUS
  Filled 2012-09-26 (×4): qty 1

## 2012-09-26 MED ORDER — IPRATROPIUM BROMIDE 0.02 % IN SOLN
0.5000 mg | RESPIRATORY_TRACT | Status: DC
  Administered 2012-09-27 – 2012-09-29 (×13): 0.5 mg via RESPIRATORY_TRACT
  Filled 2012-09-26 (×13): qty 2.5

## 2012-09-26 MED ORDER — HYDRALAZINE HCL 25 MG PO TABS
25.0000 mg | ORAL_TABLET | Freq: Two times a day (BID) | ORAL | Status: DC
  Administered 2012-09-26 – 2012-10-01 (×10): 25 mg via ORAL
  Filled 2012-09-26 (×10): qty 1

## 2012-09-26 MED ORDER — ALBUTEROL SULFATE (5 MG/ML) 0.5% IN NEBU
INHALATION_SOLUTION | RESPIRATORY_TRACT | Status: AC
  Filled 2012-09-26: qty 2

## 2012-09-26 MED ORDER — METHYLPREDNISOLONE SODIUM SUCC 125 MG IJ SOLR
125.0000 mg | Freq: Four times a day (QID) | INTRAMUSCULAR | Status: DC
  Administered 2012-09-26 – 2012-09-29 (×10): 125 mg via INTRAVENOUS
  Filled 2012-09-26 (×10): qty 2

## 2012-09-26 MED ORDER — HEPARIN SODIUM (PORCINE) 5000 UNIT/ML IJ SOLN
5000.0000 [IU] | Freq: Three times a day (TID) | INTRAMUSCULAR | Status: DC
  Administered 2012-09-26 – 2012-10-01 (×14): 5000 [IU] via SUBCUTANEOUS
  Filled 2012-09-26 (×14): qty 1

## 2012-09-26 MED ORDER — ATORVASTATIN CALCIUM 40 MG PO TABS
40.0000 mg | ORAL_TABLET | Freq: Every day | ORAL | Status: DC
  Administered 2012-09-26 – 2012-09-30 (×5): 40 mg via ORAL
  Filled 2012-09-26 (×5): qty 1

## 2012-09-26 MED ORDER — FUROSEMIDE 10 MG/ML IJ SOLN
40.0000 mg | Freq: Once | INTRAMUSCULAR | Status: AC
  Administered 2012-09-26: 40 mg via INTRAVENOUS
  Filled 2012-09-26: qty 4

## 2012-09-26 MED ORDER — COLLAGENASE 250 UNIT/GM EX OINT
TOPICAL_OINTMENT | Freq: Every day | CUTANEOUS | Status: DC
  Administered 2012-09-27 – 2012-09-28 (×2): via TOPICAL
  Administered 2012-09-28 – 2012-09-29 (×2): 1 via TOPICAL
  Administered 2012-09-30: 10:00:00 via TOPICAL
  Filled 2012-09-26: qty 30

## 2012-09-26 MED ORDER — ONDANSETRON HCL 4 MG/2ML IJ SOLN: 4.0000 mg | Freq: Three times a day (TID) | INTRAMUSCULAR | Status: AC | PRN

## 2012-09-26 MED ORDER — DEXAMETHASONE 0.5 MG PO TABS
0.5000 mg | ORAL_TABLET | Freq: Every day | ORAL | Status: DC
  Administered 2012-09-27 – 2012-09-29 (×3): 0.5 mg via ORAL
  Filled 2012-09-26 (×5): qty 1

## 2012-09-26 MED ORDER — ALBUTEROL SULFATE (5 MG/ML) 0.5% IN NEBU
10.0000 mg | INHALATION_SOLUTION | Freq: Once | RESPIRATORY_TRACT | Status: AC
  Administered 2012-09-26: 10 mg via RESPIRATORY_TRACT

## 2012-09-26 MED ORDER — MUPIROCIN 2 % EX OINT
1.0000 "application " | TOPICAL_OINTMENT | Freq: Two times a day (BID) | CUTANEOUS | Status: DC
  Administered 2012-09-26 – 2012-09-30 (×7): 1 via NASAL
  Filled 2012-09-26: qty 22

## 2012-09-26 MED ORDER — SODIUM CHLORIDE 0.9 % IV SOLN: INTRAVENOUS | Status: DC

## 2012-09-26 MED ORDER — ALBUTEROL SULFATE (5 MG/ML) 0.5% IN NEBU
2.5000 mg | INHALATION_SOLUTION | RESPIRATORY_TRACT | Status: DC
  Administered 2012-09-27 – 2012-09-29 (×13): 2.5 mg via RESPIRATORY_TRACT
  Filled 2012-09-26 (×13): qty 0.5

## 2012-09-26 MED ORDER — ACETAMINOPHEN 500 MG PO TABS: 500.0000 mg | ORAL_TABLET | ORAL | Status: DC | PRN

## 2012-09-26 NOTE — Progress Notes (Signed)
ANTIBIOTIC CONSULT NOTE - INITIAL  Pharmacy Consult for Vancomcyin Indication: rule out pneumonia  No Known Allergies  Patient Measurements: Height: 6' (182.9 cm) Weight: 277 lb 9 oz (125.9 kg) IBW/kg (Calculated) : 77.6   Vital Signs: Temp: 98.8 F (37.1 C) (10/20 2000) Temp src: Axillary (10/20 2000) BP: 141/57 mmHg (10/20 2000) Pulse Rate: 94  (10/20 2000) Intake/Output from previous day:   Intake/Output from this shift: Total I/O In: 225 [I.V.:225] Out: -   Labs:  Basename 09/26/12 1451  WBC 7.7  HGB 14.0  PLT 225  LABCREA --  CREATININE 2.22*   Estimated Creatinine Clearance: 44.3 ml/min (by C-G formula based on Cr of 2.22). No results found for this basename: VANCOTROUGH:2,VANCOPEAK:2,VANCORANDOM:2,GENTTROUGH:2,GENTPEAK:2,GENTRANDOM:2,TOBRATROUGH:2,TOBRAPEAK:2,TOBRARND:2,AMIKACINPEAK:2,AMIKACINTROU:2,AMIKACIN:2, in the last 72 hours   Microbiology: Recent Results (from the past 720 hour(s))  MRSA PCR SCREENING     Status: Abnormal   Collection Time   09/26/12  6:50 PM      Component Value Range Status Comment   MRSA by PCR POSITIVE (*) NEGATIVE Final     Medical History: Past Medical History  Diagnosis Date  . Chronic bronchitis   . Hypertension   . Cellulitis of lower leg   . Renal disorder   . COPD (chronic obstructive pulmonary disease)   . Sleep apnea   . Thyroid disease     Medications:  Scheduled:    . sodium chloride   Intravenous STAT  . albuterol  10 mg Nebulization Once  . ipratropium  0.5 mg Nebulization Q4H   And  . albuterol  2.5 mg Nebulization Q4H  . atorvastatin  40 mg Oral q1800  . calcitRIOL  0.25 mcg Oral 3 times weekly  . collagenase   Topical Daily  . desmopressin  0.2 mg Oral BID  . dexamethasone  0.5 mg Oral Daily  . furosemide  40 mg Intravenous Once  . heparin subcutaneous  5,000 Units Subcutaneous Q8H  . hydrALAZINE  25 mg Oral BID  . ipratropium  1 mg Nebulization Once  . levothyroxine  150 mcg Oral QAC  breakfast  . methylPREDNISolone (SOLU-MEDROL) injection  125 mg Intravenous Once  . methylPREDNISolone (SOLU-MEDROL) injection  125 mg Intravenous Q6H  . phenytoin  200 mg Oral Custom  . phenytoin  300 mg Oral Custom  . torsemide  40 mg Oral Daily  . vancomycin (VANCOCIN) IVPB  2,000 mg Intravenous Q48H  . DISCONTD: albuterol  2.5 mg Nebulization Q4H   Assessment: 66 yo M who has been on Vancomycin 2gm IV Q48h at NH for cellulitis. Renal function has been stable.   Goal of Therapy:  Vancomycin trough level 15-20 mcg/ml  Plan:  1) Continue Vancomycin 2gm IV Q48h 2) Check trough level prior to dose tomorrow 3) Monitor renal function and cx data  *4) Consider broadening abx coverage if patient has new infection while on Vancomycin  Ivory Bail, [facility] 09/26/2012,10:06 PM

## 2012-09-26 NOTE — ED Provider Notes (Signed)
History     CSN: [id number]  Arrival date & time 09/26/12  1440   First MD Initiated Contact with Patient 09/26/12 1448      Chief Complaint  Patient presents with  . Shortness of Breath     The history is provided by the patient, the nursing home and the EMS personnel. The history is limited by the condition of the patient (urgent need for intervention).  Pt was seen at 1340.  Pt seen on arrival to ED exam room.  Per EMS, NH report and pt, c/o gradual onset and worsening of persistent SOB and cough for the past 2 days, worse since this morning.  Pt has been given multiple nebs and PO lasix at the NH without relief.  Pt has significant hx of COPD and CHF.  Denies CP, no abd pain, no back pain, no reported fevers.    Past Medical History  Diagnosis Date  . Chronic bronchitis   . Hypertension   . Cellulitis of lower leg   . Renal disorder   . COPD (chronic obstructive pulmonary disease)   . Sleep apnea   . Thyroid disease     Past Surgical History  Procedure Date  . Brain surgery   . Brain tumor excision   . Nephrectomy     History  Substance Use Topics  . Smoking status: Former Smoker    Types: Cigarettes  . Smokeless tobacco: Not on file  . Alcohol Use: No      Review of Systems  Unable to perform ROS: Unstable vital signs    Allergies  Review of patient's allergies indicates no known allergies.  Home Medications   Current Outpatient Rx  Name Route Sig Dispense Refill  . ALBUTEROL SULFATE HFA 108 (90 BASE) MCG/ACT IN AERS Inhalation Inhale 2 puffs into the lungs every 6 (six) hours as needed. Bronchitis    . CALCITRIOL 0.25 MCG PO CAPS Oral Take 1 capsule (0.25 mcg total) by mouth 3 (three) times a week. 30 capsule 3  . COLLAGENASE 250 UNIT/GM EX OINT Topical Apply topically daily. 15 g 3  . DESMOPRESSIN ACETATE 0.2 MG PO TABS Oral Take 0.2 mg by mouth 2 (two) times daily.    Marland Kitchen DEXAMETHASONE 0.5 MG PO TABS Oral Take 0.5 mg by mouth daily.    Marland Kitchen  HYDRALAZINE HCL 25 MG PO TABS Oral Take 25 mg by mouth 2 (two) times daily.    Marland Kitchen LEVOTHYROXINE SODIUM 150 MCG PO TABS Oral Take 150 mcg by mouth daily.    Marland Kitchen PHENYTOIN SODIUM EXTENDED 100 MG PO CAPS Oral Take 200-300 mg by mouth daily. Takes 20 mg on Monday and Thursdsay. On all other days of the week takes 300 mg.    . SIMVASTATIN 80 MG PO TABS Oral Take 80 mg by mouth at bedtime.    Marland Kitchen VANCOMYCIN IVPB Intravenous Inject 2,000 mg into the vein daily. 10 vial 0    Vancomycin blood level to be monitored and dose to ...  . TORSEMIDE 20 MG PO TABS Oral Take 2 tablets (40 mg total) by mouth daily. 60 tablet 2    BP 119/57  Pulse 97  Temp 98 F (36.7 C) (Oral)  Resp 19  Ht 6\' 1"  (1.854 m)  Wt 282 lb (127.914 kg)  BMI 37.21 kg/m2  SpO2 98%  Physical Exam 1345: Physical examination:  Nursing notes reviewed; Vital signs and O2 SAT reviewed;  Constitutional: Well developed, Well nourished, Well hydrated, Uncomfortable appearing; Head:  Normocephalic, atraumatic; Eyes: EOMI, PERRL, No scleral icterus; ENMT: Mouth and pharynx normal, Mucous membranes moist; Neck: Supple, Full range of motion, No lymphadenopathy; Cardiovascular: Tachycardic rate and rhythm, No gallop; Respiratory: Breath sounds diminished & equal bilaterally, with faint scattered wheezes.  Speaking in one or two words. Tachypneic, +access mm use.; Chest: Nontender, Movement normal; Abdomen: Soft, protuberant, nontender, Nondistended, Normal bowel sounds;; Extremities: Pulses normal, No tenderness, 2+ pedal edema bilat without calf asymmetry.; Neuro: AA&Ox3, Major CN grossly intact.  Speech clear. Moves all ext well on stretcher without apparent gross focal motor deficits.; Skin: Color normal, Warm, Diaphoretic.   ED Course  Procedures   1345:  Hypoxic with Sats 83% R/A.  Pt immediately placed on Bipap.  Hour long neb and IV solumedrol ordered.  Pt has already received lasix; will monitor urine output.    1430:  Pt laying eyes closed,  easily awakens to name.  Resps more in sync with Bipap machine.  No further resp distress.  No pneumonia or CHF on CXR.  HR improving to100's, RR decreasing to 20. Sats 98-100% on Bipap.    1515:  BUN/Cr elevated per baseline.  BNP elevated but improved from previous.  Sats 98% on Bipap, HR improved to low 90's, NSR on monitor, RR 17.  Continues to state he feels better now.  Laying eyes closed, opens his eyes immediately to his name, responds to questions appropriately.   1620:  Pt feeling better now. Has urinated several times while in the ED.  T/C to Dr. Sudie Bailey, case discussed, including:  HPI, pertinent PM/SHx, VS/PE, dx testing, ED course and treatment:  Agreeable to admit, requests to write temporary orders, obtain ICU bed.    MDM  MDM Reviewed: nursing note, vitals and previous chart Reviewed previous: labs, ECG and x-ray Interpretation: ECG, labs and x-ray Total time providing critical care: 75-105 minutes. This excludes time spent performing separately reportable procedures and services. Consults: admitting MD   CRITICAL CARE Performed by: Laray Anger Total critical care time: 90 Critical care time was exclusive of separately billable procedures and treating other patients. Critical care was necessary to treat or prevent imminent or life-threatening deterioration. Critical care was time spent personally by me on the following activities: development of treatment plan with patient and/or surrogate as well as nursing, discussions with consultants, evaluation of patient's response to treatment, examination of patient, obtaining history from patient or surrogate, ordering and performing treatments and interventions, ordering and review of laboratory studies, ordering and review of radiographic studies, pulse oximetry and re-evaluation of patient's condition.   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  Results for orders placed during the hospital encounter of 09/26/12  PRO B NATRIURETIC PEPTIDE      Component Value Range   Pro B Natriuretic peptide (BNP) 2784.0 (*) 0 - 125 pg/mL  TROPONIN I      Component Value Range   Troponin I <0.30  <0.30 ng/mL  BASIC METABOLIC PANEL      Component Value Range   Sodium 140  135 - 145 mEq/L   Potassium 4.2  3.5 - 5.1 mEq/L   Chloride 102  96 - 112 mEq/L   CO2 28  19 - 32  mEq/L   Glucose, Bld 110 (*) 70 - 99 mg/dL   BUN 26 (*) 6 - 23 mg/dL   Creatinine, Ser 2.13 (*) 0.50 - 1.35 mg/dL   Calcium 8.9  8.4 - 08.6 mg/dL   GFR calc non Af Amer 29 (*) >90 mL/min   GFR calc Af Amer 34 (*) >90 mL/min  CBC WITH DIFFERENTIAL      Component Value Range   WBC 7.7  4.0 - 10.5 K/uL   RBC 4.73  4.22 - 5.81 MIL/uL   Hemoglobin 14.0  13.0 - 17.0 g/dL   HCT 57.8  46.9 - 62.9 %   MCV 93.2  78.0 - 100.0 fL   MCH 29.6  26.0 - 34.0 pg   MCHC 31.7  30.0 - 36.0 g/dL   RDW 52.8  41.3 - 24.4 %   Platelets 225  150 - 400 K/uL   Neutrophils Relative 64  43 - 77 %   Neutro Abs 4.9  1.7 - 7.7 K/uL   Lymphocytes Relative 13  12 - 46 %   Lymphs Abs 1.0  0.7 - 4.0 K/uL   Monocytes Relative 11  3 - 12 %   Monocytes Absolute 0.8  0.1 - 1.0 K/uL   Eosinophils Relative 12 (*) 0 - 5 %   Eosinophils Absolute 0.9 (*) 0.0 - 0.7 K/uL   Basophils Relative 1  0 - 1 %   Basophils Absolute 0.1  0.0 - 0.1 K/uL  BLOOD GAS, ARTERIAL      Component Value Range   FIO2 40.00     Delivery systems BILEVEL POSITIVE AIRWAY PRESSURE     Mode BILEVEL POSITIVE AIRWAY PRESSURE     Inspiratory PAP 16     Expiratory PAP 8     pH, Arterial 7.357  7.350 - 7.450   pCO2 arterial 48.1 (*) 35.0 - 45.0 mmHg   pO2, Arterial 112.0 (*)  80.0 - 100.0 mmHg   Bicarbonate 26.3 (*) 20.0 - 24.0 mEq/L   TCO2 22.8  0 - 100 mmol/L   Acid-Base Excess 1.4  0.0 - 2.0 mmol/L   O2 Saturation 97.7     Collection site RIGHT RADIAL     Drawn by COLLECTED BY RT     Sample type ARTERIAL     Allens test (pass/fail) PASS  PASS    Dg Chest Port 1 View 09/26/2012  *RADIOLOGY REPORT*  Clinical Data: Cough, rule out infiltrates  PORTABLE CHEST - 1 VIEW  Comparison: 09/14/12  Findings: Cardiomediastinal silhouette is unremarkable.  The right arm PICC line is unchanged in position. No acute infiltrate or pleural effusion.  No pulmonary edema.  IMPRESSION: No active disease.  No significant change.   Original Report Authenticated By: Natasha Mead, M.D.     Results for Pallone, [name] [name] (MRN [id number]) as of 09/26/2012 16:46  Ref. Range 12/22/2011 03:45 09/07/2012 12:27 09/08/2012 08:33 09/26/2012 14:51  Pro B Natriuretic peptide (BNP) Latest Range: 0-125 pg/mL 798.2 (H) 3544.0 (H) 3480.0 (H) 2784.0 (H)    Results for [name], [name] (MRN [id number]) as of 09/26/2012 17:06  Ref. Range 09/11/2012 06:11 09/12/2012 07:17 09/14/2012 05:34 09/15/2012 05:40 09/26/2012 14:51  BUN Latest Range: 6-23 mg/dL 35 (H) 34 (H) 36 (H) 34 (H) 26 (H)  Creatinine Latest Range: 0.50-1.35 mg/dL 5.95 (H) 6.38 (H) 7.56 (H) 2.22 (H) 2.22 (H)         Laray Anger, DO 09/27/12 1640

## 2012-09-26 NOTE — ED Notes (Signed)
Patient diaphoretic with increased work of breathing. Patient respiratory rate 42 at present. MD made aware. Order for bipap obtained and RT at bedside.

## 2012-09-26 NOTE — ED Notes (Signed)
Pt started getting sob yesterday, arrived by [facility], from [facility], has been given total of 80mg  lasix po and multiple neb treatment. s

## 2012-09-26 NOTE — Progress Notes (Signed)
eLink Physician-Brief Progress Note Patient Name: [name] [name] DOB: 02-29-1946 MRN: [id number]  Date of Service  09/26/2012   HPI/Events of Note     eICU Interventions  Sq heparin for dvt proph   Intervention Category Intermediate Interventions: Best-practice therapies (e.g. DVT, beta blocker, etc.)  [name] V. 09/26/2012, 9:00 PM

## 2012-09-26 NOTE — ED Notes (Signed)
Patient's breathing improving on Bipap. Respiratory Rate has slowed from 42 to 31 at this time. Patient states he feels some better. Patient moved to room 18 for closer observation.

## 2012-09-27 LAB — VANCOMYCIN, TROUGH: Vancomycin Tr: 13.4 ug/mL (ref 10.0–20.0)

## 2012-09-27 LAB — URINALYSIS, ROUTINE W REFLEX MICROSCOPIC
Bilirubin Urine: NEGATIVE
Hgb urine dipstick: NEGATIVE
Nitrite: NEGATIVE
Specific Gravity, Urine: 1.015 (ref 1.005–1.030)
pH: 6 (ref 5.0–8.0)

## 2012-09-27 LAB — URINE MICROSCOPIC-ADD ON

## 2012-09-27 MED ORDER — MOXIFLOXACIN HCL IN NACL 400 MG/250ML IV SOLN
400.0000 mg | INTRAVENOUS | Status: DC
  Administered 2012-09-27 – 2012-10-01 (×5): 400 mg via INTRAVENOUS
  Filled 2012-09-27 (×5): qty 250

## 2012-09-27 MED ORDER — SODIUM CHLORIDE 0.9 % IJ SOLN
INTRAMUSCULAR | Status: AC
  Filled 2012-09-27: qty 6

## 2012-09-27 MED ORDER — SODIUM CHLORIDE 0.9 % IJ SOLN
INTRAMUSCULAR | Status: AC
  Administered 2012-09-27: 10 mL
  Filled 2012-09-27: qty 3

## 2012-09-27 MED ORDER — FUROSEMIDE 10 MG/ML IJ SOLN
40.0000 mg | Freq: Two times a day (BID) | INTRAMUSCULAR | Status: DC
  Administered 2012-09-27 – 2012-09-29 (×6): 40 mg via INTRAVENOUS
  Filled 2012-09-27 (×6): qty 4

## 2012-09-27 NOTE — Clinical Social Work Psychosocial (Signed)
    Clinical Social Work Department BRIEF PSYCHOSOCIAL ASSESSMENT 09/27/2012  Patient:  [name] [name], [name] [name]     Account Number:  [id number]     Admit date:  09/26/2012  Clinical Social Worker:  Santa Genera, CLINICAL SOCIAL WORKER  Date/Time:  09/27/2012 11:30 AM  Referred by:  Physician  Date Referred:  09/27/2012 Referred for  SNF Placement   Other Referral:   Interview type:  Patient Other interview type:    PSYCHOSOCIAL DATA Living Status:  FACILITY Admitted from facility:  [facility] [place] Level of care:  Skilled Nursing Facility Primary support name:  Odai Wimmer Primary support relationship to patient:  CHILD, ADULT Degree of support available:    CURRENT CONCERNS Current Concerns  Post-Acute Placement   Other Concerns:   Facility management of patient bronchitis, wants better plan for respiratory care at SNF    SOCIAL WORK ASSESSMENT / PLAN CSW met w patient at bedside, alert and oriented x4. Family in room, sister and brother in law.  Patient recently placed at [facility] [place] for treatment of leg sore, needed 2 weeks of IV antibiotics.  Patient says that he also suffers from bronchitis, concerned that he did not receive prompt attention for this from the facility.  Says he asked for breathing treatments and inhalers when he started wheezing, but was not given these for 2 days.  Feels delay in care resulted in his hospitalization for respiratory issues.  Otherwise, he is satisfied w care received at [facility] and wants to return to finish course of IV treatment, says he has 7 days left.  Patient and family request that respiratory care plan be made clear to SNF at discharge, RN informed and asked to speak w MD about this.    [facility] [place], spoke w [place] in Admissions, willing to take patient back at discharge. Patient has [facility], facility is calling [facility] to find out if anything needs to be done to authorize return to  facility.   Assessment/plan status:  Psychosocial Support/Ongoing Assessment of Needs Other assessment/ plan:   Information/referral to community resources:   None needed at this time.    PATIENT'S/FAMILY'S RESPONSE TO PLAN OF CARE: Appreciative.  Santa Genera, LCSW Clinical Social Worker [phone])

## 2012-09-27 NOTE — Progress Notes (Signed)
UR Chart Review Completed  

## 2012-09-27 NOTE — Consult Note (Signed)
Consult requested by: Dr. Felecia Shelling Consult requested for COPD exacerbation:  HPI: This is a 67 year old who came to the emergency because of increasing cough and congestion. He has been a nursing home to receive rehabilitation. He has been improving he thinks but developed what he calls bronchitis and was brought to the hospital. When he was seen in the emergency room his PCO2 is 48 so he does have some element of respiratory failure. He has sleep apnea he has COPD. He has chronic renal disease  Past Medical History  Diagnosis Date  . Chronic bronchitis   . Hypertension   . Cellulitis of lower leg   . Renal disorder   . COPD (chronic obstructive pulmonary disease)   . Sleep apnea   . Thyroid disease      No family history on file.   History   Social History  . Marital Status: Divorced    Spouse Name: N/A    Number of Children: N/A  . Years of Education: N/A   Social History Main Topics  . Smoking status: Former Smoker    Types: Cigarettes  . Smokeless tobacco: None  . Alcohol Use: No  . Drug Use: No  . Sexually Active:    Other Topics Concern  . None   Social History Narrative  . None     ROS: He says he coughed up a little bit of sputum which is great. He denies any chest pain. He has not had any fever.    Objective: Vital signs in last 24 hours: Temp:  [98 F (36.7 C)-99 F (37.2 C)] 99 F (37.2 C) (10/21 0400) Pulse Rate:  [86-120] 100  (10/21 0600) Resp:  [11-34] 16  (10/21 0600) BP: (119-202)/(50-86) 151/83 mmHg (10/21 0600) SpO2:  [83 %-100 %] 96 % (10/21 0720) FiO2 (%):  [35 %-40 %] 35 % (10/20 1911) Weight:  [124.4 kg (274 lb 4 oz)-127.914 kg (282 lb)] 124.4 kg (274 lb 4 oz) (10/21 0500) Weight change:  Last BM Date: 09/25/12  Intake/Output from previous day: 10/20 0701 - 10/21 0700 In: 775 [P.O.:240; I.V.:535] Out: 600 [Urine:600]  PHYSICAL EXAM [name] awake and alert. [name] obese. He looks mildly short of breath still. His neck is supple. His  chest is relatively clear with decreased breath sounds and prolonged expiration. His heart is regular. His abdomen is soft. He has 2+ edema bilaterally. Central nervous system exam is grossly intact. His mucous membranes are moist.  Lab Results: Basic Metabolic Panel:  Basename 09/26/12 1451  NA 140  K 4.2  CL 102  CO2 28  GLUCOSE 110*  BUN 26*  CREATININE 2.22*  CALCIUM 8.9  MG --  PHOS --   Liver Function Tests: No results found for this basename: AST:2,ALT:2,ALKPHOS:2,BILITOT:2,PROT:2,ALBUMIN:2 in the last 72 hours No results found for this basename: LIPASE:2,AMYLASE:2 in the last 72 hours No results found for this basename: AMMONIA:2 in the last 72 hours CBC:  Basename 09/26/12 1451  WBC 7.7  NEUTROABS 4.9  HGB 14.0  HCT 44.1  MCV 93.2  PLT 225   Cardiac Enzymes:  Basename 09/26/12 1451  CKTOTAL --  CKMB --  CKMBINDEX --  TROPONINI <0.30   BNP:  Basename 09/26/12 1451  PROBNP 2784.0*   D-Dimer: No results found for this basename: DDIMER:2 in the last 72 hours CBG: No results found for this basename: GLUCAP:6 in the last 72 hours Hemoglobin A1C: No results found for this basename: HGBA1C in the last 72 hours Fasting  Lipid Panel: No results found for this basename: CHOL,HDL,LDLCALC,TRIG,CHOLHDL,LDLDIRECT in the last 72 hours Thyroid Function Tests: No results found for this basename: TSH,T4TOTAL,FREET4,T3FREE,THYROIDAB in the last 72 hours Anemia Panel: No results found for this basename: VITAMINB12,FOLATE,FERRITIN,TIBC,IRON,RETICCTPCT in the last 72 hours Coagulation: No results found for this basename: LABPROT:2,INR:2 in the last 72 hours Urine Drug Screen: Drugs of Abuse  No results found for this basename: labopia, cocainscrnur, labbenz, amphetmu, thcu, labbarb    Alcohol Level: No results found for this basename: ETH:2 in the last 72 hours Urinalysis:  Basename 09/27/12 0130  COLORURINE YELLOW  LABSPEC 1.015  PHURINE 6.0  GLUCOSEU NEGATIVE    HGBUR NEGATIVE  BILIRUBINUR NEGATIVE  KETONESUR NEGATIVE  PROTEINUR 30*  UROBILINOGEN 0.2  NITRITE NEGATIVE  LEUKOCYTESUR NEGATIVE   Misc. Labs:   ABGS:  Basename 09/26/12 1623  PHART 7.357  PO2ART 112.0*  TCO2 22.8  HCO3 26.3*     MICROBIOLOGY: Recent Results (from the past 240 hour(s))  MRSA PCR SCREENING     Status: Abnormal   Collection Time   09/26/12  6:50 PM      Component Value Range Status Comment   MRSA by PCR POSITIVE (*) NEGATIVE Final     Studies/Results: Dg Chest Port 1 View  09/26/2012  *RADIOLOGY REPORT*  Clinical Data: Cough, rule out infiltrates  PORTABLE CHEST - 1 VIEW  Comparison: 09/14/12  Findings: Cardiomediastinal silhouette is unremarkable.  The right arm PICC line is unchanged in position. No acute infiltrate or pleural effusion.  No pulmonary edema.  IMPRESSION: No active disease.  No significant change.   Original Report Authenticated By: Natasha Mead, M.D.     Medications:  Scheduled:   . albuterol  10 mg Nebulization Once  . ipratropium  0.5 mg Nebulization Q4H   And  . albuterol  2.5 mg Nebulization Q4H  . atorvastatin  40 mg Oral q1800  . calcitRIOL  0.25 mcg Oral 3 times weekly  . Chlorhexidine Gluconate Cloth  6 each Topical Q0600  . collagenase   Topical Daily  . desmopressin  0.2 mg Oral BID  . dexamethasone  0.5 mg Oral Daily  . furosemide  40 mg Intravenous Once  . furosemide  40 mg Intravenous BID  . heparin subcutaneous  5,000 Units Subcutaneous Q8H  . hydrALAZINE  25 mg Oral BID  . ipratropium  1 mg Nebulization Once  . levothyroxine  150 mcg Oral QAC breakfast  . methylPREDNISolone (SOLU-MEDROL) injection  125 mg Intravenous Once  . methylPREDNISolone (SOLU-MEDROL) injection  125 mg Intravenous Q6H  . mupirocin ointment  1 application Nasal BID  . phenytoin  200 mg Oral Custom  . phenytoin  300 mg Oral Custom  . sodium chloride      . torsemide  40 mg Oral Daily  . vancomycin (VANCOCIN) IVPB  2,000 mg Intravenous  Q48H  . DISCONTD: sodium chloride   Intravenous STAT  . DISCONTD: albuterol  2.5 mg Nebulization Q4H   Continuous:   . sodium chloride 20 mL/hr (09/26/12 2200)  . DISCONTD: sodium chloride 75 mL/hr at 09/26/12 1800   YNW:[id number], HYDROmorphone (DILAUDID) injection, ondansetron (ZOFRAN) IV  Assesment: [name] admitted with a COPD exacerbation. He seems to be doing okay. He has been started on nebulizer and steroids. He does not appear to be on an antibiotic. He does not have pneumonia but may have acute bronchitis Active Problems:  * No active hospital problems. *     Plan: I will Avelox. Continue his other  treatments.    LOS: 1 day   Keilah Lemire L 09/27/2012, 8:34 AM

## 2012-09-27 NOTE — Progress Notes (Signed)
Subjective: Patient was admitted yesterday on BIPAP for severe exacerbation COPD. He was on BIPAP during the night. He feels better now.    Objective: Vital signs in last 24 hours: Temp:  [98 F (36.7 C)-99 F (37.2 C)] 99 F (37.2 C) (10/21 0400) Pulse Rate:  [86-120] 100  (10/21 0600) Resp:  [11-34] 16  (10/21 0600) BP: (119-202)/(50-86) 151/83 mmHg (10/21 0600) SpO2:  [83 %-100 %] 96 % (10/21 0720) FiO2 (%):  [35 %-40 %] 35 % (10/20 1911) Weight:  [124.4 kg (274 lb 4 oz)-127.914 kg (282 lb)] 124.4 kg (274 lb 4 oz) (10/21 0500) Weight change:  Last BM Date: 09/25/12  Intake/Output from previous day: 10/20 0701 - 10/21 0700 In: 775 [P.O.:240; I.V.:535] Out: 600 [Urine:600]  PHYSICAL EXAM General appearance: alert and no distress Resp: diminished breath sounds bilaterally and wheezes bilaterally Cardio: S1, S2 normal GI: soft, non-tender; bowel sounds normal; no masses,  no organomegaly Extremities: 2+ bilateral pitting leg edema, dress wound on both legs anteriorly  Lab Results:    @labtest @ ABGS  Basename 09/26/12 1623  PHART 7.357  PO2ART 112.0*  TCO2 22.8  HCO3 26.3*   CULTURES Recent Results (from the past 240 hour(s))  MRSA PCR SCREENING     Status: Abnormal   Collection Time   09/26/12  6:50 PM      Component Value Range Status Comment   MRSA by PCR POSITIVE (*) NEGATIVE Final    Studies/Results: Dg Chest Port 1 View  09/26/2012  *RADIOLOGY REPORT*  Clinical Data: Cough, rule out infiltrates  PORTABLE CHEST - 1 VIEW  Comparison: 09/14/12  Findings: Cardiomediastinal silhouette is unremarkable.  The right arm PICC line is unchanged in position. No acute infiltrate or pleural effusion.  No pulmonary edema.  IMPRESSION: No active disease.  No significant change.   Original Report Authenticated By: Natasha Mead, M.D.     Medications: I have reviewed the patient's current medications.  Assesment: 1. Severe chronic obstructive pulmonary disease.  2.  Resolving cellulitis of the left lower leg.  3. Chronic edema.  4. Chronic renal insufficiency.  5. Benign essential hypertension.  6. Sleep apnea.  7. Status post pituitary tumor resection, now panhypopituitarism.  8. Morbid obesity  Active Problems:  * No active hospital problems. *     Plan: Continue neb treatment, Iv steroids and IV antibiotics Pulmonary consult Wound care Continue regular treatment.    LOS: 1 day   Lania Zawistowski 09/27/2012, 8:16 AM

## 2012-09-27 NOTE — H&P (Cosign Needed)
NAME:  [name] [name], [name] [name]                  ACCOUNT NO.:  [id number]  MEDICAL RECORD NO.:  [id number]  LOCATION:  IC02                          FACILITY:  [facility]  PHYSICIAN:  Mila Homer. Sudie Bailey, M.D.DATE OF BIRTH:  1946/07/22  DATE OF ADMISSION:  09/26/2012 DATE OF DISCHARGE:  LH                             HISTORY & PHYSICAL   This 66 year old presented to the emergency room short of breath which had started the day before admission.  [name] currently staying at the [facility].  He had been given multiple neb treatments without benefit and had also been given 80 mg of Lasix by mouth without benefit.  He was recently hospitalized here with bilateral leg edema and ulcerations to the legs.  Other diagnoses include chronic renal disease, hypertension, COPD on oxygen therapy, obstructive sleep apnea, morbid obesity, hypothyroidism, panhypopituitarism and other issues.  He smoked cigarettes for the past, but apparently does not anymore.  [name] status post a pituitary tumor resection.  He also has had a nephrectomy.  The patient was not able to give much of a history he was so short of breath.  He was initially treated in the emergency department.  At the time I saw him, he was in the ICU.  His temperature was 99 degrees, pulse 100, respiratory rate 16, blood pressure 151/83.  He was working hard to breathe, and in fact, his respiratory rate was in the 20s.  He had BiPAP on.  He had rhonchi and wheezes throughout the lungs and prolonged expirations.  His heart had a regular rhythm, rate of 100.  His abdomen was soft, without organomegaly or mass or tenderness.  He was obese and perhaps morbidly obese.  He had brawny discoloration of both lower legs and thickening and swelling of the legs with at least 1 to 2+ pitting edema.  The area of infection he had been treated for earlier in the month measured about 2 inches x 1 inch in the distal left anterior lower leg.  Current medications he  has been taking include Rocaltrol 0.25 mg 3 times a week, desmopressin 0.2 mg b.i.d., dexamethasone 0.5 mg daily, hydralazine 25 mg b.i.d., levothyroxine 150 mcg daily, phenytoin 100 mg ER, 200 mg on Monday and Thursday and 300 mg the other days of the week, simvastatin 80 mg at bedtime, torsemide 20 mg 2 tablets daily, albuterol 2 puffs every 6 hours for breathing, collagenase on his area of skin breakdown on the left leg daily.  His admission white cell count was 7700, with a hemoglobin of 14.0.  His BMP showed BUN 26, creatinine 2.22.  Blood gases showed pH 7.357 with a pCO2 of 48, pO2 112, bicarb 26.  His UA was essentially negative.  MRSA was positive by PCR.  Chest x-ray showed no active disease.  He had a right arm PICC line.  ADMISSION DIAGNOSES: 1. Severe chronic obstructive pulmonary disease. 2. Resolving cellulitis of the left lower leg. 3. Chronic edema. 4. Chronic renal insufficiency. 5. Benign essential hypertension. 6. Sleep apnea. 7. Status post pituitary tumor resection, now panhypopituitarism. 8. Morbid obesity.  [name] admitted to the ICU because of his severe COPD.  [name] currently on BiPAP, as noted above.  Home meds are continued but in addition, he will be on furosemide 40 mg IV q.12 hours and albuterol/ipratropium neb treatments q.4 hours.  He will be on Solu-Medrol 125 mg IV q.6 hours. He will be on vancomycin given the MRSA positivity.     Mila Homer. Sudie Bailey, M.D.     SDK/MEDQ  D:  09/27/2012  T:  09/27/2012  Job:  [id number]

## 2012-09-27 NOTE — Consult Note (Signed)
WOC consult Note Reason for Consult:Full thickness ulceration on the left pretibial area Wound type:venous insufficiency Pressure Ulcer POA: No Measurement:4cm x 3cm x 2.cm  Wound RUE:AVWUJ, pink, moist: 70% pink, 30% yellow slough Drainage (amount, consistency, odor) scant amount bleeding with old dressing removal (dry), none noted on old dressing (dry) Periwound:intact, edematous (Patient states this is improved) Dressing procedure/placement/frequency:I will continue with previously ordered collagenase ointment until wound bed is 100% pink. Following that, any dressing to support moisture retention is indicated, including soft silicone foam dressing (Allevyn), with twice weekly changes in combination with elevation.  Prevalon boots are provided to prevent heel pressure ulcers while in house as patient is at risk. I will not follow, but will remain available to this patient and to his nursing and medical staff.  Please re-consult if needed. Thanks, Ladona Mow, MSN, RN, [facility], CWOCN [phone])

## 2012-09-28 MED ORDER — VANCOMYCIN HCL 1000 MG IV SOLR
1250.0000 mg | INTRAVENOUS | Status: DC
  Administered 2012-09-28 – 2012-09-30 (×3): 1250 mg via INTRAVENOUS
  Filled 2012-09-28 (×4): qty 1250

## 2012-09-28 MED ORDER — SODIUM CHLORIDE 0.9 % IJ SOLN
INTRAMUSCULAR | Status: AC
  Administered 2012-09-28: 10 mL
  Filled 2012-09-28: qty 3

## 2012-09-28 NOTE — Progress Notes (Signed)
Subjective: Patient feels better. His breathing is improving. The wheezing is less. [name] off BIPAP.Marland Kitchen    Objective: Vital signs in last 24 hours: Temp:  [98.2 F (36.8 C)-98.8 F (37.1 C)] 98.5 F (36.9 C) (10/22 0400) Pulse Rate:  [85-100] 89  (10/22 0600) Resp:  [14-19] 18  (10/22 0500) BP: (106-143)/(58-88) 128/64 mmHg (10/22 0600) SpO2:  [92 %-99 %] 95 % (10/22 0600) Weight:  [125.9 kg (277 lb 9 oz)] 125.9 kg (277 lb 9 oz) (10/22 0400) Weight change: -2.014 kg (-4 lb 7.1 oz) Last BM Date: 09/25/12  Intake/Output from previous day: 10/21 0701 - 10/22 0700 In: 3194 [P.O.:1980; I.V.:460; IV Piggyback:754] Out: 1775 [Urine:1775]  PHYSICAL EXAM General appearance: alert and no distress Resp: diminished breath sounds bilaterally and wheezes bilaterally Cardio: S1, S2 normal GI: soft, non-tender; bowel sounds normal; no masses,  no organomegaly Extremities: 2+ bilateral pitting leg edema, dress wound on both legs anteriorly  Lab Results:    @labtest @ ABGS  Basename 09/26/12 1623  PHART 7.357  PO2ART 112.0*  TCO2 22.8  HCO3 26.3*   CULTURES Recent Results (from the past 240 hour(s))  MRSA PCR SCREENING     Status: Abnormal   Collection Time   09/26/12  6:50 PM      Component Value Range Status Comment   MRSA by PCR POSITIVE (*) NEGATIVE Final    Studies/Results: Dg Chest Port 1 View  09/26/2012  *RADIOLOGY REPORT*  Clinical Data: Cough, rule out infiltrates  PORTABLE CHEST - 1 VIEW  Comparison: 09/14/12  Findings: Cardiomediastinal silhouette is unremarkable.  The right arm PICC line is unchanged in position. No acute infiltrate or pleural effusion.  No pulmonary edema.  IMPRESSION: No active disease.  No significant change.   Original Report Authenticated By: Natasha Mead, M.D.     Medications: I have reviewed the patient's current medications.  Assesment: 1. Severe chronic obstructive pulmonary disease.  2. Resolving cellulitis of the left lower leg.  3.  Chronic edema.  4. Chronic renal insufficiency.  5. Benign essential hypertension.  6. Sleep apnea.  7. Status post pituitary tumor resection, now panhypopituitarism.  8. Morbid obesity  Active Problems:  * No active hospital problems. *     Plan: Continue neb treatment, Iv steroids and IV antibiotics Pulmonary consult appreciated Wound care Continue regular treatment.    LOS: 2 days   [name] [name] 09/28/2012, 8:12 AM

## 2012-09-28 NOTE — Progress Notes (Signed)
ANTIBIOTIC CONSULT NOTE   Pharmacy Consult for Vancomcyin Indication: rule out pneumonia  No Known Allergies  Patient Measurements: Height: 6' (182.9 cm) Weight: 277 lb 9 oz (125.9 kg) IBW/kg (Calculated) : 77.6   Vital Signs: Temp: 98.5 F (36.9 C) (10/22 0400) Temp src: Oral (10/22 0400) BP: 128/64 mmHg (10/22 0600) Pulse Rate: 89  (10/22 0600) Intake/Output from previous day: 10/21 0701 - 10/22 0700 In: 3194 [P.O.:1980; I.V.:460; IV Piggyback:754] Out: 1775 [Urine:1775] Intake/Output from this shift:    Labs:  Basename 09/26/12 1451  WBC 7.7  HGB 14.0  PLT 225  LABCREA --  CREATININE 2.22*   Estimated Creatinine Clearance: 44.3 ml/min (by C-G formula based on Cr of 2.22).  Basename 09/27/12 1620  VANCOTROUGH 13.4  VANCOPEAK --  VANCORANDOM --  GENTTROUGH --  GENTPEAK --  GENTRANDOM --  TOBRATROUGH --  TOBRAPEAK --  TOBRARND --  AMIKACINPEAK --  AMIKACINTROU --  AMIKACIN --    Microbiology: Recent Results (from the past 720 hour(s))  MRSA PCR SCREENING     Status: Abnormal   Collection Time   09/26/12  6:50 PM      Component Value Range Status Comment   MRSA by PCR POSITIVE (*) NEGATIVE Final    Medical History: Past Medical History  Diagnosis Date  . Chronic bronchitis   . Hypertension   . Cellulitis of lower leg   . Renal disorder   . COPD (chronic obstructive pulmonary disease)   . Sleep apnea   . Thyroid disease    Medications:  Scheduled:     . ipratropium  0.5 mg Nebulization Q4H   And  . albuterol  2.5 mg Nebulization Q4H  . atorvastatin  40 mg Oral q1800  . calcitRIOL  0.25 mcg Oral 3 times weekly  . Chlorhexidine Gluconate Cloth  6 each Topical Q0600  . collagenase   Topical Daily  . desmopressin  0.2 mg Oral BID  . dexamethasone  0.5 mg Oral Daily  . furosemide  40 mg Intravenous BID  . heparin subcutaneous  5,000 Units Subcutaneous Q8H  . hydrALAZINE  25 mg Oral BID  . levothyroxine  150 mcg Oral QAC breakfast  .  methylPREDNISolone (SOLU-MEDROL) injection  125 mg Intravenous Q6H  . moxifloxacin  400 mg Intravenous Q24H  . mupirocin ointment  1 application Nasal BID  . phenytoin  200 mg Oral Custom  . phenytoin  300 mg Oral Custom  . sodium chloride      . sodium chloride      . sodium chloride      . torsemide  40 mg Oral Daily  . vancomycin  1,250 mg Intravenous Q24H  . DISCONTD: sodium chloride   Intravenous STAT  . DISCONTD: vancomycin (VANCOCIN) IVPB  2,000 mg Intravenous Q48H   Assessment: 65 yo M who has been on Vancomycin 2gm IV Q48h at NH for cellulitis. Renal function has been stable. Will target trough of 15-20.  Trough below target.  Goal of Therapy:  Vancomycin trough level 15-20 mcg/ml  Plan:  1) Change Vancomycin to 1250mg  IV q24hrs 2) Check trough level at steady state 3) Monitor renal function and cx data  *4) Consider broadening abx coverage if patient has new infection while on Vancomycin  Margo Aye, Misa Fedorko A 09/28/2012,8:04 AM

## 2012-09-28 NOTE — Progress Notes (Signed)
PT ASSISTED UP IN RECLINER. ASSIT X1 ONLY. TOLERATED WELL.

## 2012-09-28 NOTE — Clinical Social Work Note (Signed)
Angel from [facility] [place], came to assess patient for return to their facility at discharge.  Facility aware that patient and family expressed desire for plan for prompt treatment of respiratory issues while at SNF.  Santa Genera, LCSW Clinical Social Worker [phone])

## 2012-09-28 NOTE — Progress Notes (Signed)
Subjective: He says he feels better. He has no new complaints. His breathing is improved.  Objective: Vital signs in last 24 hours: Temp:  [98.2 F (36.8 C)-98.8 F (37.1 C)] 98.6 F (37 C) (10/22 0854) Pulse Rate:  [85-93] 89  (10/22 0600) Resp:  [15-19] 18  (10/22 0500) BP: (106-143)/(58-88) 128/64 mmHg (10/22 0600) SpO2:  [92 %-99 %] 96 % (10/22 0836) Weight:  [125.9 kg (277 lb 9 oz)] 125.9 kg (277 lb 9 oz) (10/22 0400) Weight change: -2.014 kg (-4 lb 7.1 oz) Last BM Date: 09/25/12  Intake/Output from previous day: 10/21 0701 - 10/22 0700 In: 3194 [P.O.:1980; I.V.:460; IV Piggyback:754] Out: 1775 [Urine:1775]  PHYSICAL EXAM General appearance: alert, cooperative and no distress Resp: clear to auscultation bilaterally Cardio: regular rate and rhythm, S1, S2 normal, no murmur, click, rub or gallop GI: soft, non-tender; bowel sounds normal; no masses,  no organomegaly Extremities: He has chronic changes in both legs and has on a boot  Lab Results:    Basic Metabolic Panel:  Basename 09/26/12 1451  NA 140  K 4.2  CL 102  CO2 28  GLUCOSE 110*  BUN 26*  CREATININE 2.22*  CALCIUM 8.9  MG --  PHOS --   Liver Function Tests: No results found for this basename: AST:2,ALT:2,ALKPHOS:2,BILITOT:2,PROT:2,ALBUMIN:2 in the last 72 hours No results found for this basename: LIPASE:2,AMYLASE:2 in the last 72 hours No results found for this basename: AMMONIA:2 in the last 72 hours CBC:  Basename 09/26/12 1451  WBC 7.7  NEUTROABS 4.9  HGB 14.0  HCT 44.1  MCV 93.2  PLT 225   Cardiac Enzymes:  Basename 09/26/12 1451  CKTOTAL --  CKMB --  CKMBINDEX --  TROPONINI <0.30   BNP:  Basename 09/26/12 1451  PROBNP 2784.0*   D-Dimer: No results found for this basename: DDIMER:2 in the last 72 hours CBG: No results found for this basename: GLUCAP:6 in the last 72 hours Hemoglobin A1C: No results found for this basename: HGBA1C in the last 72 hours Fasting Lipid  Panel: No results found for this basename: CHOL,HDL,LDLCALC,TRIG,CHOLHDL,LDLDIRECT in the last 72 hours Thyroid Function Tests: No results found for this basename: TSH,T4TOTAL,FREET4,T3FREE,THYROIDAB in the last 72 hours Anemia Panel: No results found for this basename: VITAMINB12,FOLATE,FERRITIN,TIBC,IRON,RETICCTPCT in the last 72 hours Coagulation: No results found for this basename: LABPROT:2,INR:2 in the last 72 hours Urine Drug Screen: Drugs of Abuse  No results found for this basename: labopia, cocainscrnur, labbenz, amphetmu, thcu, labbarb    Alcohol Level: No results found for this basename: ETH:2 in the last 72 hours Urinalysis:  Basename 09/27/12 0130  COLORURINE YELLOW  LABSPEC 1.015  PHURINE 6.0  GLUCOSEU NEGATIVE  HGBUR NEGATIVE  BILIRUBINUR NEGATIVE  KETONESUR NEGATIVE  PROTEINUR 30*  UROBILINOGEN 0.2  NITRITE NEGATIVE  LEUKOCYTESUR NEGATIVE   Misc. Labs:  ABGS  Basename 09/26/12 1623  PHART 7.357  PO2ART 112.0*  TCO2 22.8  HCO3 26.3*   CULTURES Recent Results (from the past 240 hour(s))  MRSA PCR SCREENING     Status: Abnormal   Collection Time   09/26/12  6:50 PM      Component Value Range Status Comment   MRSA by PCR POSITIVE (*) NEGATIVE Final    Studies/Results: Dg Chest Port 1 View  09/26/2012  *RADIOLOGY REPORT*  Clinical Data: Cough, rule out infiltrates  PORTABLE CHEST - 1 VIEW  Comparison: 09/14/12  Findings: Cardiomediastinal silhouette is unremarkable.  The right arm PICC line is unchanged in position. No acute infiltrate or  pleural effusion.  No pulmonary edema.  IMPRESSION: No active disease.  No significant change.   Original Report Authenticated By: Natasha Mead, M.D.     Medications:  Scheduled:   . ipratropium  0.5 mg Nebulization Q4H   And  . albuterol  2.5 mg Nebulization Q4H  . atorvastatin  40 mg Oral q1800  . calcitRIOL  0.25 mcg Oral 3 times weekly  . Chlorhexidine Gluconate Cloth  6 each Topical Q0600  . collagenase    Topical Daily  . desmopressin  0.2 mg Oral BID  . dexamethasone  0.5 mg Oral Daily  . furosemide  40 mg Intravenous BID  . heparin subcutaneous  5,000 Units Subcutaneous Q8H  . hydrALAZINE  25 mg Oral BID  . levothyroxine  150 mcg Oral QAC breakfast  . methylPREDNISolone (SOLU-MEDROL) injection  125 mg Intravenous Q6H  . moxifloxacin  400 mg Intravenous Q24H  . mupirocin ointment  1 application Nasal BID  . phenytoin  200 mg Oral Custom  . phenytoin  300 mg Oral Custom  . sodium chloride      . sodium chloride      . sodium chloride      . torsemide  40 mg Oral Daily  . vancomycin  1,250 mg Intravenous Q24H  . DISCONTD: vancomycin (VANCOCIN) IVPB  2,000 mg Intravenous Q48H   Continuous:   . sodium chloride 20 mL/hr at 09/28/12 0600   ZOX:[id number], HYDROmorphone (DILAUDID) injection  Assesment: He has COPD exacerbation. [name] doing better. He has chronic problems with his legs. He has renal insufficiency. He has sleep apnea. 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 *     Plan: No change in treatment    LOS: 2 days   [name] [name] 09/28/2012, 11:50 AM

## 2012-09-29 LAB — BASIC METABOLIC PANEL
BUN: 47 mg/dL — ABNORMAL HIGH (ref 6–23)
Calcium: 8.4 mg/dL (ref 8.4–10.5)
Creatinine, Ser: 2.37 mg/dL — ABNORMAL HIGH (ref 0.50–1.35)
GFR calc Af Amer: 31 mL/min — ABNORMAL LOW (ref >90)
GFR calc non Af Amer: 27 mL/min — ABNORMAL LOW (ref >90)
Glucose, Bld: 167 mg/dL — ABNORMAL HIGH (ref 70–99)

## 2012-09-29 MED ORDER — METHYLPREDNISOLONE SODIUM SUCC 125 MG IJ SOLR
80.0000 mg | Freq: Four times a day (QID) | INTRAMUSCULAR | Status: DC
  Administered 2012-09-29 – 2012-09-30 (×4): 80 mg via INTRAVENOUS
  Filled 2012-09-29 (×4): qty 2

## 2012-09-29 MED ORDER — ALBUTEROL SULFATE (5 MG/ML) 0.5% IN NEBU
2.5000 mg | INHALATION_SOLUTION | Freq: Four times a day (QID) | RESPIRATORY_TRACT | Status: DC
  Administered 2012-09-29 – 2012-10-01 (×9): 2.5 mg via RESPIRATORY_TRACT
  Filled 2012-09-29 (×9): qty 0.5

## 2012-09-29 MED ORDER — IPRATROPIUM BROMIDE 0.02 % IN SOLN
0.5000 mg | Freq: Four times a day (QID) | RESPIRATORY_TRACT | Status: DC
  Administered 2012-09-29 – 2012-10-01 (×9): 0.5 mg via RESPIRATORY_TRACT
  Filled 2012-09-29 (×9): qty 2.5

## 2012-09-29 NOTE — Progress Notes (Signed)
Subjective: Patient is doing better. His breathing has improved. No wheezing.    Objective: Vital signs in last 24 hours: Temp:  [97.3 F (36.3 C)-98.9 F (37.2 C)] 98.5 F (36.9 C) (10/23 0544) Pulse Rate:  [86-94] 88  (10/23 0600) Resp:  [13-23] 16  (10/23 0600) BP: (114-169)/(53-130) 143/68 mmHg (10/23 0600) SpO2:  [95 %-100 %] 98 % (10/23 0729) Weight:  [126.1 kg (278 lb)] 126.1 kg (278 lb) (10/23 0544) Weight change: 0.2 kg (7.1 oz) Last BM Date: 09/28/12  Intake/Output from previous day: 10/22 0701 - 10/23 0700 In: 2485.7 [P.O.:1560; I.V.:467.7; IV Piggyback:458] Out: 2050 [Urine:2050]  PHYSICAL EXAM General appearance: alert and no distress Resp: diminished breath sounds bilaterally and wheezes bilaterally Cardio: S1, S2 normal GI: soft, non-tender; bowel sounds normal; no masses,  no organomegaly Extremities: 2+ bilateral pitting leg edema, dress wound on both legs anteriorly  Lab Results:    @labtest @ ABGS  Basename 09/26/12 1623  PHART 7.357  PO2ART 112.0*  TCO2 22.8  HCO3 26.3*   CULTURES Recent Results (from the past 240 hour(s))  MRSA PCR SCREENING     Status: Abnormal   Collection Time   09/26/12  6:50 PM      Component Value Range Status Comment   MRSA by PCR POSITIVE (*) NEGATIVE Final    Studies/Results: No results found.  Medications: I have reviewed the patient's current medications.  Assesment: 1. Severe chronic obstructive pulmonary disease.  2. Resolving cellulitis of the left lower leg.  3. Chronic edema.  4. Chronic renal insufficiency.  5. Benign essential hypertension.  6. Sleep apnea.  7. Status post pituitary tumor resection, now panhypopituitarism.  8. Morbid obesity  Active Problems:  * No active hospital problems. *     Plan: Continue neb treatment, Iv steroids and IV antibiotics We will taper iv steroid Cbc/BMP in am   Wound care Continue regular treatment.    LOS: 3 days   Jerrianne Hartin 09/29/2012, 7:50  AM

## 2012-09-29 NOTE — Clinical Social Work Note (Signed)
Patient information faxed to [facility] via Providerlink, reviewer notified to expect documents.  Lawanna Kobus, Admissions at [facility], notified that we have given insurance company documents needed to authorize return to SNF.  Santa Genera, LCSW Clinical Social Worker [phone])

## 2012-09-29 NOTE — Evaluation (Signed)
Physical Therapy Evaluation Patient Details Name: [name] [name] MRN: [id number] DOB: 19-Jun-1946 Today's Date: 09/29/2012 Time: 4098-1191 PT Time Calculation (min): 37 min  PT Assessment / Plan / Recommendation Clinical Impression  Pt was seen for eval.  No functional problems were noted...strength, balance and gait with no assistive device are WNL.  No PT needed    PT Assessment  Patent does not need any further PT services    Follow Up Recommendations  No PT follow up    Does the patient have the potential to tolerate intense rehabilitation      Barriers to Discharge        Equipment Recommendations  None recommended by PT    Recommendations for Other Services     Frequency      Precautions / Restrictions Precautions Precautions: Other (comment) Precaution Comments: orange contact Restrictions Weight Bearing Restrictions: No   Pertinent Vitals/Pain       Mobility  Bed Mobility Bed Mobility: Supine to Sit;Sit to Supine Supine to Sit: 7: Independent;HOB flat Sit to Supine: 7: Independent;HOB flat Transfers Transfers: Sit to Stand;Stand to Sit Sit to Stand: 7: Independent Stand to Sit: 7: Independent Ambulation/Gait Ambulation/Gait Assistance: 6: Modified independent (Device/Increase time) Ambulation Distance (Feet): 300 Feet Assistive device: None Ambulation/Gait Assistance Details: stable gait pattern Gait Pattern: Within Functional Limits Gait velocity: WNL General Gait Details: no assistive device needed Stairs: No (pt declined) Wheelchair Mobility Wheelchair Mobility: No    Shoulder Instructions     Exercises General Exercises - Lower Extremity Ankle Circles/Pumps: Strengthening;Both;5 reps Heel Slides: Strengthening;Both;5 reps;Supine Hip ABduction/ADduction: Strengthening;Both;5 reps;Supine Straight Leg Raises: AROM;Both;10 reps;Supine   PT Diagnosis:    PT Problem List:   PT Treatment Interventions:     PT Goals    Visit Information  Last PT Received On: 09/29/12    Subjective Data  Subjective: I don't think I need a walker for walking Patient Stated Goal: none stated   Prior Functioning  Home Living Lives With: Alone Available Help at Discharge: Friend(s);Family Type of Home: House (recently at [facility]) Home Access: Stairs to enter [employer] of Steps: 2 Entrance Stairs-Rails: None Home Layout: One level Firefighter: Standard Home Adaptive Equipment: Walker - rolling;Straight cane Prior Function Level of Independence:  (pt walked with walker at SNF) Able to Take Stairs?: Yes Driving: No Vocation: Retired Musician: No difficulties    Cognition  Overall Cognitive Status: Appears within functional limits for tasks assessed/performed Arousal/Alertness: Awake/alert Orientation Level: Appears intact for tasks assessed Behavior During Session: [facility] for tasks performed    Extremity/Trunk Assessment Right Lower Extremity Assessment RLE ROM/Strength/Tone: Within functional levels RLE Sensation: WFL - Light Touch RLE Coordination: WFL - gross motor Left Lower Extremity Assessment LLE ROM/Strength/Tone: Within functional levels LLE Sensation: WFL - Light Touch LLE Coordination: WFL - gross motor Trunk Assessment Trunk Assessment: Normal   Balance Balance Balance Assessed: No (WNL by functional observation)  End of Session PT - End of Session Equipment Utilized During Treatment: Gait belt Activity Tolerance: Patient tolerated treatment well Patient left: in bed;with call bell/phone within reach;with bed alarm set Nurse Communication: Mobility status  GP     Konrad Penta 09/29/2012, 11:57 AM

## 2012-09-29 NOTE — Progress Notes (Signed)
Subjective: He says he feels better. His breathing is much improved. He has no new complaints. His legs are about the same.  Objective: Vital signs in last 24 hours: Temp:  [97.3 F (36.3 C)-98.9 F (37.2 C)] 98.8 F (37.1 C) (10/23 0800) Pulse Rate:  [86-100] 100  (10/23 0800) Resp:  [13-24] 21  (10/23 0800) BP: (114-169)/(53-130) 163/73 mmHg (10/23 0800) SpO2:  [89 %-100 %] 98 % (10/23 0800) Weight:  [126.1 kg (278 lb)] 126.1 kg (278 lb) (10/23 0544) Weight change: 0.2 kg (7.1 oz) Last BM Date: 09/28/12  Intake/Output from previous day: 10/22 0701 - 10/23 0700 In: 2485.7 [P.O.:1560; I.V.:467.7; IV Piggyback:458] Out: 2050 [Urine:2050]  PHYSICAL EXAM General appearance: alert, cooperative, no distress and morbidly obese Resp: rhonchi bilaterally Cardio: regular rate and rhythm, S1, S2 normal, no murmur, click, rub or gallop GI: soft, non-tender; bowel sounds normal; no masses,  no organomegaly Extremities: extremities normal, atraumatic, no cyanosis or edema  Lab Results:    Basic Metabolic Panel:  Basename 09/29/12 0421 09/26/12 1451  NA 136 140  K 4.5 4.2  CL 100 102  CO2 26 28  GLUCOSE 167* 110*  BUN 47* 26*  CREATININE 2.37* 2.22*  CALCIUM 8.4 8.9  MG -- --  PHOS -- --   Liver Function Tests: No results found for this basename: AST:2,ALT:2,ALKPHOS:2,BILITOT:2,PROT:2,ALBUMIN:2 in the last 72 hours No results found for this basename: LIPASE:2,AMYLASE:2 in the last 72 hours No results found for this basename: AMMONIA:2 in the last 72 hours CBC:  Basename 09/26/12 1451  WBC 7.7  NEUTROABS 4.9  HGB 14.0  HCT 44.1  MCV 93.2  PLT 225   Cardiac Enzymes:  Basename 09/26/12 1451  CKTOTAL --  CKMB --  CKMBINDEX --  TROPONINI <0.30   BNP:  Basename 09/26/12 1451  PROBNP 2784.0*   D-Dimer: No results found for this basename: DDIMER:2 in the last 72 hours CBG: No results found for this basename: GLUCAP:6 in the last 72 hours Hemoglobin A1C: No  results found for this basename: HGBA1C in the last 72 hours Fasting Lipid Panel: No results found for this basename: CHOL,HDL,LDLCALC,TRIG,CHOLHDL,LDLDIRECT in the last 72 hours Thyroid Function Tests: No results found for this basename: TSH,T4TOTAL,FREET4,T3FREE,THYROIDAB in the last 72 hours Anemia Panel: No results found for this basename: VITAMINB12,FOLATE,FERRITIN,TIBC,IRON,RETICCTPCT in the last 72 hours Coagulation: No results found for this basename: LABPROT:2,INR:2 in the last 72 hours Urine Drug Screen: Drugs of Abuse  No results found for this basename: labopia, cocainscrnur, labbenz, amphetmu, thcu, labbarb    Alcohol Level: No results found for this basename: ETH:2 in the last 72 hours Urinalysis:  Basename 09/27/12 0130  COLORURINE YELLOW  LABSPEC 1.015  PHURINE 6.0  GLUCOSEU NEGATIVE  HGBUR NEGATIVE  BILIRUBINUR NEGATIVE  KETONESUR NEGATIVE  PROTEINUR 30*  UROBILINOGEN 0.2  NITRITE NEGATIVE  LEUKOCYTESUR NEGATIVE   Misc. Labs:  ABGS  Basename 09/26/12 1623  PHART 7.357  PO2ART 112.0*  TCO2 22.8  HCO3 26.3*   CULTURES Recent Results (from the past 240 hour(s))  MRSA PCR SCREENING     Status: Abnormal   Collection Time   09/26/12  6:50 PM      Component Value Range Status Comment   MRSA by PCR POSITIVE (*) NEGATIVE Final    Studies/Results: No results found.  Medications:  Scheduled:   . ipratropium  0.5 mg Nebulization QID   And  . albuterol  2.5 mg Nebulization QID  . atorvastatin  40 mg Oral q1800  . calcitRIOL  0.25 mcg Oral 3 times weekly  . Chlorhexidine Gluconate Cloth  6 each Topical Q0600  . collagenase   Topical Daily  . desmopressin  0.2 mg Oral BID  . dexamethasone  0.5 mg Oral Daily  . furosemide  40 mg Intravenous BID  . heparin subcutaneous  5,000 Units Subcutaneous Q8H  . hydrALAZINE  25 mg Oral BID  . levothyroxine  150 mcg Oral QAC breakfast  . methylPREDNISolone (SOLU-MEDROL) injection  80 mg Intravenous Q6H  .  moxifloxacin  400 mg Intravenous Q24H  . mupirocin ointment  1 application Nasal BID  . phenytoin  200 mg Oral Custom  . phenytoin  300 mg Oral Custom  . torsemide  40 mg Oral Daily  . vancomycin  1,250 mg Intravenous Q24H  . DISCONTD: albuterol  2.5 mg Nebulization Q4H  . DISCONTD: ipratropium  0.5 mg Nebulization Q4H  . DISCONTD: methylPREDNISolone (SOLU-MEDROL) injection  125 mg Intravenous Q6H   Continuous:   . sodium chloride 20 mL/hr at 09/29/12 0600   ZOX:[id number], HYDROmorphone (DILAUDID) injection  Assesment: [name] better. [name] admitted with COPD exacerbation that has improved. He has cellulitis of the legs. Active Problems:  * No active hospital problems. *     Plan: He's going to move out of the intensive care unit which I think is appropriate. Continue his other treatments    LOS: 3 days   Issai Werling L 09/29/2012, 8:47 AM

## 2012-09-29 NOTE — Care Management Note (Signed)
    Page 1 of 2   10/01/2012     9:52:35 AM   CARE MANAGEMENT NOTE 10/01/2012  Patient:  [name] [name], [name] [name]   Account Number:  [id number]  Date Initiated:  09/29/2012  Documentation initiated by:  Sharrie Rothman  Subjective/Objective Assessment:   Pt admitted from [facility] [place] with CHF and COPD. Pt will return to [facility] at discharge.     Action/Plan:   CSW will arrange discharge when medically stable.   Anticipated DC Date:  10/02/2012   Anticipated DC Plan:  SKILLED NURSING FACILITY  In-house referral  Clinical Social Worker      DC Associate Professor  CM consult      [facility] Choice  HOME HEALTH   Choice offered to / List presented to:  C-1 Patient        HH arranged  HH-1 RN      [facility] agency  [facility]   Status of service:  Completed, signed off Medicare Important Message given?  YES (If response is "NO", the following Medicare IM given date fields will be blank) Date Medicare IM given:  09/30/2012 Date Additional Medicare IM given:    Discharge Disposition:  HOME W HOME HEALTH SERVICES  Per UR Regulation:    If discussed at Long Length of Stay Meetings, dates discussed:    Comments:  10/01/12 0942 Arlyss Queen, RN BSN CM Pt discharged home today with [facility] Rn. Alroy Bailiff of [facility] is aware and will collect the pts information from the chart. No DME needs noted.  HH services to start within 48 hours. Pt and pts nurse aware of discharge.  09/29/12 1540 Arlyss Queen, RN BSN CM

## 2012-09-29 NOTE — Progress Notes (Signed)
Report given to Armanda Magic, RN.  Patient ready to transfer to med-surg.  Dressing to left lower leg changed and PICC line dressing changed.  Patient in no acute distress.  Patient out via wheelchair to room 307. Family at the bedside assisting with patient.

## 2012-09-30 LAB — URINE CULTURE

## 2012-09-30 LAB — BASIC METABOLIC PANEL
BUN: 50 mg/dL — ABNORMAL HIGH (ref 6–23)
Calcium: 8.4 mg/dL (ref 8.4–10.5)
Creatinine, Ser: 2.21 mg/dL — ABNORMAL HIGH (ref 0.50–1.35)
GFR calc Af Amer: 34 mL/min — ABNORMAL LOW (ref >90)
GFR calc non Af Amer: 29 mL/min — ABNORMAL LOW (ref >90)

## 2012-09-30 LAB — CBC
HCT: 41.1 % (ref 39.0–52.0)
MCHC: 31.9 g/dL (ref 30.0–36.0)
Platelets: 228 10*3/uL (ref 150–400)
RDW: 14.8 % (ref 11.5–15.5)
WBC: 10.3 10*3/uL (ref 4.0–10.5)

## 2012-09-30 MED ORDER — FUROSEMIDE 40 MG PO TABS
40.0000 mg | ORAL_TABLET | Freq: Every day | ORAL | Status: DC
  Administered 2012-09-30 – 2012-10-01 (×2): 40 mg via ORAL
  Filled 2012-09-30 (×2): qty 1

## 2012-09-30 MED ORDER — PREDNISONE 20 MG PO TABS
40.0000 mg | ORAL_TABLET | Freq: Every day | ORAL | Status: DC
  Administered 2012-09-30 – 2012-10-01 (×2): 40 mg via ORAL
  Filled 2012-09-30 (×2): qty 2

## 2012-09-30 MED ORDER — SODIUM CHLORIDE 0.9 % IJ SOLN
INTRAMUSCULAR | Status: AC
  Filled 2012-09-30: qty 3

## 2012-09-30 NOTE — Clinical Social Work Note (Signed)
CSW updated Lawanna Kobus at [facility], let her know that patient will not have skilled need at discharge as he is not requiring IV antibiotics at this point.  Santa Genera, LCSW Clinical Social Worker [phone])

## 2012-09-30 NOTE — Progress Notes (Signed)
Subjective: Patient feels better. [name] ambulating well. No new complaint   Objective: Vital signs in last 24 hours: Temp:  [98 F (36.7 C)-98.8 F (37.1 C)] 98.4 F (36.9 C) (10/24 0414) Pulse Rate:  [89-100] 89  (10/24 0414) Resp:  [17-21] 18  (10/24 0414) BP: (134-165)/(72-102) 150/72 mmHg (10/24 0414) SpO2:  [92 %-98 %] 94 % (10/24 0414) Weight change:  Last BM Date: 09/28/12  Intake/Output from previous day: 10/23 0701 - 10/24 0700 In: 1344 [P.O.:600; I.V.:240; IV Piggyback:504] Out: 3000 [Urine:3000]  PHYSICAL EXAM General appearance: alert and no distress Resp: diminished breath sounds bilaterally and wheezes bilaterally Cardio: S1, S2 normal GI: soft, non-tender; bowel sounds normal; no masses,  no organomegaly Extremities: 2+ bilateral pitting leg edema, dress wound on both legs anteriorly  Lab Results:    @labtest @ ABGS No results found for this basename: PHART,PCO2,PO2ART,TCO2,HCO3 in the last 72 hours CULTURES Recent Results (from the past 240 hour(s))  MRSA PCR SCREENING     Status: Abnormal   Collection Time   09/26/12  6:50 PM      Component Value Range Status Comment   MRSA by PCR POSITIVE (*) NEGATIVE Final   URINE CULTURE     Status: Normal (Preliminary result)   Collection Time   09/27/12  1:30 AM      Component Value Range Status Comment   Specimen Description URINE, CLEAN CATCH   Final    Special Requests NONE   Final    Culture  Setup Time 09/28/2012 01:21   Final    Colony Count >=100,000 COLONIES/ML   Final    Culture PROTEUS MIRABILIS   Final    Report Status PENDING   Incomplete    Studies/Results: No results found.  Medications: I have reviewed the patient's current medications.  Assesment: 1. Severe chronic obstructive pulmonary disease.  2. Resolving cellulitis of the left lower leg.  3. Chronic edema.  4. Chronic renal insufficiency.  5. Benign essential hypertension.  6. Sleep apnea.  7. Status post pituitary tumor  resection, now panhypopituitarism.  8. Morbid obesity  Active Problems:  * No active hospital problems. *     Plan: We will change steroid to po We will change diuretic to po BMP in am   Wound care Continue regular treatment.    LOS: 4 days   Shauntavia Brackin 09/30/2012, 7:54 AM

## 2012-09-30 NOTE — Progress Notes (Signed)
Subjective: [name] awake and alert and looks better. He has no new complaints.  Objective: Vital signs in last 24 hours: Temp:  [98 F (36.7 C)-98.4 F (36.9 C)] 98.4 F (36.9 C) (10/24 0414) Pulse Rate:  [89-95] 89  (10/24 0414) Resp:  [17-18] 18  (10/24 0414) BP: (134-165)/(72-102) 150/72 mmHg (10/24 0414) SpO2:  [91 %-98 %] 91 % (10/24 0754) Weight change:  Last BM Date: 09/28/12  Intake/Output from previous day: 10/23 0701 - 10/24 0700 In: 1344 [P.O.:600; I.V.:240; IV Piggyback:504] Out: 3000 [Urine:3000]  PHYSICAL EXAM General appearance: alert, cooperative and no distress Resp: clear to auscultation bilaterally Cardio: regular rate and rhythm, S1, S2 normal, no murmur, click, rub or gallop GI: soft, non-tender; bowel sounds normal; no masses,  no organomegaly Extremities: extremities normal, atraumatic, no cyanosis or edema  Lab Results:    Basic Metabolic Panel:  Basename 09/30/12 0443 09/29/12 0421  NA 139 136  K 4.1 4.5  CL 101 100  CO2 29 26  GLUCOSE 125* 167*  BUN 50* 47*  CREATININE 2.21* 2.37*  CALCIUM 8.4 8.4  MG -- --  PHOS -- --   Liver Function Tests: No results found for this basename: AST:2,ALT:2,ALKPHOS:2,BILITOT:2,PROT:2,ALBUMIN:2 in the last 72 hours No results found for this basename: LIPASE:2,AMYLASE:2 in the last 72 hours No results found for this basename: AMMONIA:2 in the last 72 hours CBC:  Basename 09/30/12 0443  WBC 10.3  NEUTROABS --  HGB 13.1  HCT 41.1  MCV 92.4  PLT 228   Cardiac Enzymes: No results found for this basename: CKTOTAL:3,CKMB:3,CKMBINDEX:3,TROPONINI:3 in the last 72 hours BNP: No results found for this basename: PROBNP:3 in the last 72 hours D-Dimer: No results found for this basename: DDIMER:2 in the last 72 hours CBG: No results found for this basename: GLUCAP:6 in the last 72 hours Hemoglobin A1C: No results found for this basename: HGBA1C in the last 72 hours Fasting Lipid Panel: No results found  for this basename: CHOL,HDL,LDLCALC,TRIG,CHOLHDL,LDLDIRECT in the last 72 hours Thyroid Function Tests: No results found for this basename: TSH,T4TOTAL,FREET4,T3FREE,THYROIDAB in the last 72 hours Anemia Panel: No results found for this basename: VITAMINB12,FOLATE,FERRITIN,TIBC,IRON,RETICCTPCT in the last 72 hours Coagulation: No results found for this basename: LABPROT:2,INR:2 in the last 72 hours Urine Drug Screen: Drugs of Abuse  No results found for this basename: labopia, cocainscrnur, labbenz, amphetmu, thcu, labbarb    Alcohol Level: No results found for this basename: ETH:2 in the last 72 hours Urinalysis: No results found for this basename: COLORURINE:2,APPERANCEUR:2,LABSPEC:2,PHURINE:2,GLUCOSEU:2,HGBUR:2,BILIRUBINUR:2,KETONESUR:2,PROTEINUR:2,UROBILINOGEN:2,NITRITE:2,LEUKOCYTESUR:2 in the last 72 hours Misc. Labs:  ABGS No results found for this basename: PHART,PCO2,PO2ART,TCO2,HCO3 in the last 72 hours CULTURES Recent Results (from the past 240 hour(s))  MRSA PCR SCREENING     Status: Abnormal   Collection Time   09/26/12  6:50 PM      Component Value Range Status Comment   MRSA by PCR POSITIVE (*) NEGATIVE Final   URINE CULTURE     Status: Normal (Preliminary result)   Collection Time   09/27/12  1:30 AM      Component Value Range Status Comment   Specimen Description URINE, CLEAN CATCH   Final    Special Requests NONE   Final    Culture  Setup Time 09/28/2012 01:21   Final    Colony Count >=100,000 COLONIES/ML   Final    Culture PROTEUS MIRABILIS   Final    Report Status PENDING   Incomplete    Studies/Results: No results found.  Medications:  Prior to Admission:  Prescriptions prior to admission  Medication Sig Dispense Refill  . calcitRIOL (ROCALTROL) 0.25 MCG capsule Take 1 capsule (0.25 mcg total) by mouth 3 (three) times a week.  30 capsule  3  . desmopressin (DDAVP) 0.2 MG tablet Take 0.2 mg by mouth 2 (two) times daily.      Marland Kitchen dexamethasone (DECADRON)  0.5 MG tablet Take 0.5 mg by mouth daily.      . hydrALAZINE (APRESOLINE) 25 MG tablet Take 25 mg by mouth 2 (two) times daily.      Marland Kitchen levothyroxine (SYNTHROID, LEVOTHROID) 150 MCG tablet Take 150 mcg by mouth daily.      . phenytoin (DILANTIN) 100 MG ER capsule Take 200-300 mg by mouth daily. Takes 200 mg on Monday and Thursdsay. On all other days of the week takes 300 mg.      . simvastatin (ZOCOR) 80 MG tablet Take 80 mg by mouth at bedtime.      . torsemide (DEMADEX) 20 MG tablet Take 2 tablets (40 mg total) by mouth daily.  60 tablet  2  . albuterol (PROVENTIL HFA;VENTOLIN HFA) 108 (90 BASE) MCG/ACT inhaler Inhale 2 puffs into the lungs every 6 (six) hours as needed. Bronchitis      . collagenase (SANTYL) ointment Apply topically daily.  15 g  3  . sodium chloride 0.9 % SOLN 500 mL with vancomycin 1000 MG SOLR Inject 2,000 mg into the vein every other day. Next dose due 09/27/12 at 6pm       Scheduled:   . ipratropium  0.5 mg Nebulization QID   And  . albuterol  2.5 mg Nebulization QID  . atorvastatin  40 mg Oral q1800  . calcitRIOL  0.25 mcg Oral 3 times weekly  . Chlorhexidine Gluconate Cloth  6 each Topical Q0600  . collagenase   Topical Daily  . desmopressin  0.2 mg Oral BID  . furosemide  40 mg Oral Daily  . heparin subcutaneous  5,000 Units Subcutaneous Q8H  . hydrALAZINE  25 mg Oral BID  . levothyroxine  150 mcg Oral QAC breakfast  . moxifloxacin  400 mg Intravenous Q24H  . mupirocin ointment  1 application Nasal BID  . phenytoin  200 mg Oral Custom  . phenytoin  300 mg Oral Custom  . predniSONE  40 mg Oral Daily  . sodium chloride      . vancomycin  1,250 mg Intravenous Q24H  . DISCONTD: dexamethasone  0.5 mg Oral Daily  . DISCONTD: furosemide  40 mg Intravenous BID  . DISCONTD: methylPREDNISolone (SOLU-MEDROL) injection  80 mg Intravenous Q6H  . DISCONTD: torsemide  40 mg Oral Daily   Continuous:   . sodium chloride 20 mL/hr at 09/29/12 1900   ZOX:[id number],  HYDROmorphone (DILAUDID) injection  Assesment: He has COPD exacerbation which is much better. [name] apparently being prepared for discharge. He still has problems with edema of his legs. Active Problems:  * No active hospital problems. *     Plan: No change in treatments. He says he thinks he is going home tomorrow I will plan to follow somewhat more peripherally since he so much better    LOS: 4 days   Chiara Coltrin L 09/30/2012, 8:49 AM

## 2012-10-01 LAB — BASIC METABOLIC PANEL
BUN: 48 mg/dL — ABNORMAL HIGH (ref 6–23)
GFR calc Af Amer: 35 mL/min — ABNORMAL LOW (ref >90)
GFR calc non Af Amer: 30 mL/min — ABNORMAL LOW (ref >90)
Potassium: 4.1 mEq/L (ref 3.5–5.1)
Sodium: 141 mEq/L (ref 135–145)

## 2012-10-01 MED ORDER — SODIUM CHLORIDE 0.9 % IJ SOLN
INTRAMUSCULAR | Status: AC
  Administered 2012-10-01: 05:00:00
  Filled 2012-10-01: qty 3

## 2012-10-01 MED ORDER — SODIUM CHLORIDE 0.9 % IJ SOLN
INTRAMUSCULAR | Status: AC
  Filled 2012-10-01: qty 3

## 2012-10-01 MED ORDER — PREDNISONE (PAK) 10 MG PO TABS: 10.0000 mg | ORAL_TABLET | Freq: Every day | ORAL | Status: DC

## 2012-10-01 NOTE — Progress Notes (Signed)
Notified Dr. Felecia Shelling of pts positive urine cx and he is not being d/c'd on any abx therapy.  Reported that the patient has resistant to NITROFURANTOIN qnd his pharmacy is [facility].  MD stated he would call the medication in.

## 2012-10-01 NOTE — Progress Notes (Signed)
Pt discharged with instructions, prescriptions, and care notes.  He and his daughter verbalized understanding.  Pt left the floor via w/c with staff and family in stable condition.  All questions and any concerns addressed.

## 2012-10-01 NOTE — Discharge Summary (Signed)
Physician Discharge Summary  Patient ID: [name] [name] MRN: [id number] DOB/AGE: [birth year] 66 y.o. Primary Care Physician:Mandalyn Pasqua, MD Admit date: 09/26/2012 Discharge date: 10/01/2012    Discharge Diagnoses:  1. Severe chronic obstructive pulmonary disease.  2. Resolving cellulitis of the left lower leg.  3. Chronic edema.  4. Chronic renal insufficiency.  5. Benign essential hypertension.  6. Sleep apnea.  7. Status post pituitary tumor resection, now panhypopituitarism.  8. Morbid obesity  Active Problems:  * No active hospital problems. *      Medication List     As of 10/01/2012  8:16 AM    TAKE these medications         albuterol 108 (90 BASE) MCG/ACT inhaler   Commonly known as: PROVENTIL HFA;VENTOLIN HFA   Inhale 2 puffs into the lungs every 6 (six) hours as needed. Bronchitis      calcitRIOL 0.25 MCG capsule   Commonly known as: ROCALTROL   Take 1 capsule (0.25 mcg total) by mouth 3 (three) times a week.      collagenase ointment   Commonly known as: SANTYL   Apply topically daily.      desmopressin 0.2 MG tablet   Commonly known as: DDAVP   Take 0.2 mg by mouth 2 (two) times daily.      dexamethasone 0.5 MG tablet   Commonly known as: DECADRON   Take 0.5 mg by mouth daily.      hydrALAZINE 25 MG tablet   Commonly known as: APRESOLINE   Take 25 mg by mouth 2 (two) times daily.      levothyroxine 150 MCG tablet   Commonly known as: SYNTHROID, LEVOTHROID   Take 150 mcg by mouth daily.      phenytoin 100 MG ER capsule   Commonly known as: DILANTIN   Take 200-300 mg by mouth daily. Takes 200 mg on Monday and Thursdsay. On all other days of the week takes 300 mg.      predniSONE 10 MG tablet   Commonly known as: STERAPRED UNI-PAK   Take 1 tablet (10 mg total) by mouth daily. 40 mg po day for 3 days then 30 mg po daily for 3 days, then 20 mg po daily for 3 days and 10 mg for 3 days.      simvastatin 80 MG tablet   Commonly known as: ZOCOR   Take 80 mg by mouth at bedtime.      sodium chloride 0.9 % SOLN 500 mL with vancomycin 1000 MG SOLR   Inject 2,000 mg into the vein every other day. Next dose due 09/27/12 at 6pm      torsemide 20 MG tablet   Commonly known as: DEMADEX   Take 2 tablets (40 mg total) by mouth daily.        Discharged Condition:*stable   Consults:*pulmonary Significant Diagnostic Studies: US Renal  09/09/2012  *RADIOLOGY REPORT*  Clinical Data: Status post right nephrectomy, elevated BUN/creatinine  RENAL/URINARY TRACT ULTRASOUND COMPLETE  Comparison:  MRI abdomen dated 01/08/2009. Renal ultrasound dated 12/28/2008.  Findings:  Right Kidney:  Surgically absent.  Left Kidney:  Measures 12.0 cm.  Increased parenchymal echogenicity, suggesting medical renal disease.  1.8 x 1.7 x 2.1 cm complex upper pole lesion which cannot be characterized as a simple cyst, although grossly unchanged from 2010. Additional 1.6 x 1.4 x 1.6 in the interpolar cyst.  No hydronephrosis.  Bladder:  Underdistended.  IMPRESSION: Right kidney is surgically absent.  2.1 cm complex left upper pole  lesion which cannot be characterized as a simple cyst, although grossly unchanged from 2010.  Consider MRI abdomen (with half dose contrast if GFR remains > 30) for further characterization.  No hydronephrosis.   Original Report Authenticated By: Charline Bills, M.D.    US Arterial Seg Single  09/14/2012  *RADIOLOGY REPORT*  Clinical Data: Leg swelling, nonhealing left leg wound.  Previous tobacco use, hypertension.  ULTRASOUND ANKLE/BRACHIAL INDICES BILATERAL  Comparison: None.  Findings: At rest, the right ABI is 1.25, left 1.23.  Biphasic wave forms recorded distally in both lower extremities.  IMPRESSION:  1.  No evidence of hemodynamically significant lower extremity arterial occlusive disease at rest.   Original Report Authenticated By: Osa Craver, M.D.    Dg Chest Port 1 View  09/26/2012  *RADIOLOGY REPORT*  Clinical Data:  Cough, rule out infiltrates  PORTABLE CHEST - 1 VIEW  Comparison: 09/14/12  Findings: Cardiomediastinal silhouette is unremarkable.  The right arm PICC line is unchanged in position. No acute infiltrate or pleural effusion.  No pulmonary edema.  IMPRESSION: No active disease.  No significant change.   Original Report Authenticated By: Natasha Mead, M.D.    Dg Chest Port 1 View  09/14/2012  *RADIOLOGY REPORT*  Clinical Data: PICC line placement  PORTABLE CHEST - 1 VIEW  Comparison: Portable exam 1101 hours compared to 09/07/2012  Findings: Tip of right arm PICC line projects over the proximal SVC. Enlargement of cardiac silhouette. Tortuous aorta. Prominent hila and central pulmonary arteries unchanged. Low lung volumes with bibasilar atelectasis and crowding of perihilar markings. No definite pleural effusion or pneumothorax.  IMPRESSION: Tip of right arm PICC line projects over proximal SVC. Enlargement of cardiac silhouette. Low lung volumes with bibasilar atelectasis.   Original Report Authenticated By: Lollie Marrow, M.D.    Dg Chest Port 1 View  09/07/2012  *RADIOLOGY REPORT*  Clinical Data: Shortness of breath and leg swelling.  PORTABLE CHEST - 1 VIEW  Comparison: 06/01/2012  Findings: Moderate cardiomegaly. No pleural effusion or pneumothorax.  Pulmonary artery enlargement is again identified. Mild pulmonary venous congestion. No lobar consolidation.  IMPRESSION: Cardiomegaly with development of mild pulmonary venous congestion.  Pulmonary artery enlargement suggests pulmonary arterial hypertension.   Original Report Authenticated By: Consuello Bossier, M.D.     Lab Results: Basic Metabolic Panel:  Basename 10/01/12 0447 09/30/12 0443  NA 141 139  K 4.1 4.1  CL 105 101  CO2 27 29  GLUCOSE 94 125*  BUN 48* 50*  CREATININE 2.13* 2.21*  CALCIUM 8.7 8.4  MG -- --  PHOS -- --   Liver Function Tests: No results found for this basename: AST:2,ALT:2,ALKPHOS:2,BILITOT:2,PROT:2,ALBUMIN:2 in the last  72 hours   CBC:  Basename 09/30/12 0443  WBC 10.3  NEUTROABS --  HGB 13.1  HCT 41.1  MCV 92.4  PLT 228    Recent Results (from the past 240 hour(s))  MRSA PCR SCREENING     Status: Abnormal   Collection Time   09/26/12  6:50 PM      Component Value Range Status Comment   MRSA by PCR POSITIVE (*) NEGATIVE Final   URINE CULTURE     Status: Normal   Collection Time   09/27/12  1:30 AM      Component Value Range Status Comment   Specimen Description URINE, CLEAN CATCH   Final    Special Requests NONE   Final    Culture  Setup Time 09/28/2012 01:21   Final  Colony Count >=100,000 COLONIES/ML   Final    Culture PROTEUS MIRABILIS   Final    Report Status 09/30/2012 FINAL   Final    Organism ID, Bacteria PROTEUS MIRABILIS   Final      Hospital Course: * This is a 66 years old male patient with history of multiple medical illness who admitted due to exacerbation COPD. Initially he was started on BIPAP and Iv steroid. After he improved his steroid was changed to po. Patient is do much better and he will be ddischarged home to be followed in the office in one week duration. Discharge Exam: Blood pressure 159/78, pulse 90, temperature 97.4 F (36.3 C), temperature source Oral, resp. rate 18, height 6' (1.829 m), weight 126.1 kg (278 lb), SpO2 95.00%. * Disposition: *home       Follow-up Information    Follow up with Rodney Wigger, MD. In 1 week.   Contact information:   [address] [place] [address] [phone]          Signed: Christabella Alvira  10/01/2012, 8:16 AM

## 2012-10-01 NOTE — Clinical Social Work Note (Signed)
Notified patient's insurance that patient is discharging to home not SNF.  CSW signing off unless further needs arise as patient is not returning to placement.  Santa Genera, LCSW Clinical Social Worker [phone])

## 2012-10-08 DIAGNOSIS — I351 Nonrheumatic aortic (valve) insufficiency: Secondary | ICD-10-CM | POA: Insufficient documentation

## 2012-10-08 HISTORY — DX: Nonrheumatic aortic (valve) insufficiency: I35.1

## 2012-10-26 ENCOUNTER — Inpatient Hospital Stay (HOSPITAL_COMMUNITY)
Admission: EM | Admit: 2012-10-26 | Discharge: 2012-10-31 | DRG: 292 | Disposition: A | Discharge status: Home Health | Payer: Medicare Other | Attending: Pulmonary Disease | Admitting: Pulmonary Disease

## 2012-10-26 ENCOUNTER — Encounter (HOSPITAL_COMMUNITY): Payer: Self-pay

## 2012-10-26 ENCOUNTER — Emergency Department (HOSPITAL_COMMUNITY): Payer: Medicare Other

## 2012-10-26 DIAGNOSIS — Z905 Acquired absence of kidney: Secondary | ICD-10-CM

## 2012-10-26 DIAGNOSIS — L0390 Cellulitis, unspecified: Secondary | ICD-10-CM | POA: Diagnosis present

## 2012-10-26 DIAGNOSIS — J449 Chronic obstructive pulmonary disease, unspecified: Secondary | ICD-10-CM | POA: Diagnosis present

## 2012-10-26 DIAGNOSIS — E232 Diabetes insipidus: Secondary | ICD-10-CM | POA: Diagnosis present

## 2012-10-26 DIAGNOSIS — E669 Obesity, unspecified: Secondary | ICD-10-CM

## 2012-10-26 DIAGNOSIS — I509 Heart failure, unspecified: Secondary | ICD-10-CM

## 2012-10-26 DIAGNOSIS — G4733 Obstructive sleep apnea (adult) (pediatric): Secondary | ICD-10-CM | POA: Diagnosis present

## 2012-10-26 DIAGNOSIS — E2749 Other adrenocortical insufficiency: Secondary | ICD-10-CM | POA: Diagnosis present

## 2012-10-26 DIAGNOSIS — Z6841 Body Mass Index (BMI) 40.0 and over, adult: Secondary | ICD-10-CM

## 2012-10-26 DIAGNOSIS — I10 Essential (primary) hypertension: Secondary | ICD-10-CM

## 2012-10-26 DIAGNOSIS — R5381 Other malaise: Secondary | ICD-10-CM | POA: Diagnosis present

## 2012-10-26 DIAGNOSIS — Y92009 Unspecified place in unspecified non-institutional (private) residence as the place of occurrence of the external cause: Secondary | ICD-10-CM

## 2012-10-26 DIAGNOSIS — Z87891 Personal history of nicotine dependence: Secondary | ICD-10-CM

## 2012-10-26 DIAGNOSIS — I872 Venous insufficiency (chronic) (peripheral): Secondary | ICD-10-CM | POA: Diagnosis present

## 2012-10-26 DIAGNOSIS — Y836 Removal of other organ (partial) (total) as the cause of abnormal reaction of the patient, or of later complication, without mention of misadventure at the time of the procedure: Secondary | ICD-10-CM | POA: Diagnosis present

## 2012-10-26 DIAGNOSIS — I498 Other specified cardiac arrhythmias: Secondary | ICD-10-CM | POA: Diagnosis present

## 2012-10-26 DIAGNOSIS — I129 Hypertensive chronic kidney disease with stage 1 through stage 4 chronic kidney disease, or unspecified chronic kidney disease: Secondary | ICD-10-CM | POA: Diagnosis present

## 2012-10-26 DIAGNOSIS — N189 Chronic kidney disease, unspecified: Secondary | ICD-10-CM | POA: Diagnosis present

## 2012-10-26 DIAGNOSIS — E079 Disorder of thyroid, unspecified: Secondary | ICD-10-CM | POA: Diagnosis present

## 2012-10-26 DIAGNOSIS — Z79899 Other long term (current) drug therapy: Secondary | ICD-10-CM

## 2012-10-26 DIAGNOSIS — E785 Hyperlipidemia, unspecified: Secondary | ICD-10-CM | POA: Diagnosis present

## 2012-10-26 DIAGNOSIS — Z86011 Personal history of benign neoplasm of the brain: Secondary | ICD-10-CM

## 2012-10-26 DIAGNOSIS — L02419 Cutaneous abscess of limb, unspecified: Secondary | ICD-10-CM | POA: Diagnosis present

## 2012-10-26 DIAGNOSIS — Z85528 Personal history of other malignant neoplasm of kidney: Secondary | ICD-10-CM

## 2012-10-26 DIAGNOSIS — J4489 Other specified chronic obstructive pulmonary disease: Secondary | ICD-10-CM | POA: Diagnosis present

## 2012-10-26 DIAGNOSIS — I2789 Other specified pulmonary heart diseases: Secondary | ICD-10-CM | POA: Diagnosis present

## 2012-10-26 DIAGNOSIS — L97809 Non-pressure chronic ulcer of other part of unspecified lower leg with unspecified severity: Secondary | ICD-10-CM | POA: Diagnosis present

## 2012-10-26 DIAGNOSIS — E236 Other disorders of pituitary gland: Secondary | ICD-10-CM | POA: Diagnosis present

## 2012-10-26 DIAGNOSIS — J90 Pleural effusion, not elsewhere classified: Secondary | ICD-10-CM

## 2012-10-26 DIAGNOSIS — I452 Bifascicular block: Secondary | ICD-10-CM | POA: Diagnosis present

## 2012-10-26 DIAGNOSIS — N289 Disorder of kidney and ureter, unspecified: Secondary | ICD-10-CM

## 2012-10-26 DIAGNOSIS — E893 Postprocedural hypopituitarism: Secondary | ICD-10-CM | POA: Diagnosis present

## 2012-10-26 DIAGNOSIS — I252 Old myocardial infarction: Secondary | ICD-10-CM

## 2012-10-26 DIAGNOSIS — R197 Diarrhea, unspecified: Secondary | ICD-10-CM | POA: Diagnosis present

## 2012-10-26 DIAGNOSIS — I5031 Acute diastolic (congestive) heart failure: Principal | ICD-10-CM

## 2012-10-26 LAB — CBC WITH DIFFERENTIAL/PLATELET
Basophils Relative: 1 % (ref 0–1)
Eosinophils Absolute: 0.6 10*3/uL (ref 0.0–0.7)
Lymphs Abs: 1.5 10*3/uL (ref 0.7–4.0)
MCH: 29.6 pg (ref 26.0–34.0)
MCHC: 32.3 g/dL (ref 30.0–36.0)
Neutro Abs: 5.2 10*3/uL (ref 1.7–7.7)
Neutrophils Relative %: 62 % (ref 43–77)
Platelets: 192 10*3/uL (ref 150–400)
RBC: 4.49 MIL/uL (ref 4.22–5.81)

## 2012-10-26 LAB — URINALYSIS, ROUTINE W REFLEX MICROSCOPIC
Bilirubin Urine: NEGATIVE
Leukocytes, UA: NEGATIVE
Nitrite: NEGATIVE
Specific Gravity, Urine: 1.025 (ref 1.005–1.030)
Urobilinogen, UA: 0.2 mg/dL (ref 0.0–1.0)

## 2012-10-26 LAB — BASIC METABOLIC PANEL
GFR calc Af Amer: 31 mL/min — ABNORMAL LOW (ref >90)
GFR calc non Af Amer: 27 mL/min — ABNORMAL LOW (ref >90)
Potassium: 4.9 mEq/L (ref 3.5–5.1)
Sodium: 138 mEq/L (ref 135–145)

## 2012-10-26 LAB — TROPONIN I: Troponin I: <0.3 ng/mL (ref <0.30)

## 2012-10-26 LAB — URINE MICROSCOPIC-ADD ON

## 2012-10-26 MED ORDER — ONDANSETRON HCL 4 MG PO TABS: 4.0000 mg | ORAL_TABLET | Freq: Four times a day (QID) | ORAL | Status: DC | PRN

## 2012-10-26 MED ORDER — DEXAMETHASONE 0.5 MG PO TABS
0.5000 mg | ORAL_TABLET | Freq: Every day | ORAL | Status: DC
  Administered 2012-10-26 – 2012-10-31 (×6): 0.5 mg via ORAL
  Filled 2012-10-26 (×7): qty 1

## 2012-10-26 MED ORDER — LEVOTHYROXINE SODIUM 75 MCG PO TABS
150.0000 ug | ORAL_TABLET | Freq: Every day | ORAL | Status: DC
  Administered 2012-10-26 – 2012-10-31 (×6): 150 ug via ORAL
  Filled 2012-10-26 (×6): qty 2

## 2012-10-26 MED ORDER — CALCITRIOL 0.25 MCG PO CAPS
0.2500 ug | ORAL_CAPSULE | ORAL | Status: DC
  Administered 2012-10-27 – 2012-10-29 (×2): 0.25 ug via ORAL
  Filled 2012-10-26 (×2): qty 1

## 2012-10-26 MED ORDER — PHENYTOIN SODIUM EXTENDED 100 MG PO CAPS
300.0000 mg | ORAL_CAPSULE | ORAL | Status: DC
  Administered 2012-10-27 – 2012-10-31 (×4): 300 mg via ORAL
  Filled 2012-10-26 (×4): qty 3

## 2012-10-26 MED ORDER — ALUM & MAG HYDROXIDE-SIMETH 200-200-20 MG/5ML PO SUSP: 30.0000 mL | Freq: Four times a day (QID) | ORAL | Status: DC | PRN

## 2012-10-26 MED ORDER — BUDESONIDE-FORMOTEROL FUMARATE 160-4.5 MCG/ACT IN AERO
2.0000 | INHALATION_SPRAY | Freq: Two times a day (BID) | RESPIRATORY_TRACT | Status: DC
Start: 2012-10-26 — End: 2012-10-31
  Administered 2012-10-26 – 2012-10-31 (×10): 2 via RESPIRATORY_TRACT
  Filled 2012-10-26: qty 6

## 2012-10-26 MED ORDER — CLONIDINE HCL 0.1 MG PO TABS
0.1000 mg | ORAL_TABLET | Freq: Three times a day (TID) | ORAL | Status: DC
  Administered 2012-10-26 – 2012-10-31 (×14): 0.1 mg via ORAL
  Filled 2012-10-26 (×14): qty 1

## 2012-10-26 MED ORDER — ONDANSETRON HCL 4 MG/2ML IJ SOLN: 4.0000 mg | Freq: Four times a day (QID) | INTRAMUSCULAR | Status: DC | PRN

## 2012-10-26 MED ORDER — PHENYTOIN SODIUM EXTENDED 100 MG PO CAPS
300.0000 mg | ORAL_CAPSULE | Freq: Once | ORAL | Status: AC
  Administered 2012-10-26: 300 mg via ORAL
  Filled 2012-10-26: qty 3

## 2012-10-26 MED ORDER — PHENYTOIN SODIUM EXTENDED 100 MG PO CAPS: 200.0000 mg | ORAL_CAPSULE | ORAL | Status: DC

## 2012-10-26 MED ORDER — TRAZODONE HCL 50 MG PO TABS: 25.0000 mg | ORAL_TABLET | Freq: Every evening | ORAL | Status: DC | PRN

## 2012-10-26 MED ORDER — DESMOPRESSIN ACETATE 0.2 MG PO TABS
ORAL_TABLET | ORAL | Status: AC
  Filled 2012-10-26: qty 1

## 2012-10-26 MED ORDER — HYDROCODONE-ACETAMINOPHEN 5-325 MG PO TABS: 1.0000 | ORAL_TABLET | ORAL | Status: DC | PRN

## 2012-10-26 MED ORDER — ACETAMINOPHEN 650 MG RE SUPP: 650.0000 mg | Freq: Four times a day (QID) | RECTAL | Status: DC | PRN

## 2012-10-26 MED ORDER — SODIUM CHLORIDE 0.9 % IJ SOLN
3.0000 mL | INTRAMUSCULAR | Status: DC | PRN
  Administered 2012-10-29: 3 mL via INTRAVENOUS

## 2012-10-26 MED ORDER — SODIUM CHLORIDE 0.9 % IV SOLN: 250.0000 mL | INTRAVENOUS | Status: DC | PRN

## 2012-10-26 MED ORDER — BUDESONIDE-FORMOTEROL FUMARATE 160-4.5 MCG/ACT IN AERO
INHALATION_SPRAY | RESPIRATORY_TRACT | Status: AC
  Filled 2012-10-26: qty 6

## 2012-10-26 MED ORDER — TIOTROPIUM BROMIDE MONOHYDRATE 18 MCG IN CAPS
18.0000 ug | ORAL_CAPSULE | Freq: Every day | RESPIRATORY_TRACT | Status: DC
  Administered 2012-10-27 – 2012-10-31 (×5): 18 ug via RESPIRATORY_TRACT
  Filled 2012-10-26: qty 5

## 2012-10-26 MED ORDER — DESMOPRESSIN ACETATE 0.2 MG PO TABS
0.2000 mg | ORAL_TABLET | Freq: Two times a day (BID) | ORAL | Status: DC
  Administered 2012-10-26 – 2012-10-31 (×10): 0.2 mg via ORAL
  Filled 2012-10-26 (×12): qty 1

## 2012-10-26 MED ORDER — HYDRALAZINE HCL 25 MG PO TABS
25.0000 mg | ORAL_TABLET | Freq: Two times a day (BID) | ORAL | Status: DC
  Administered 2012-10-26 – 2012-10-31 (×10): 25 mg via ORAL
  Filled 2012-10-26 (×10): qty 1

## 2012-10-26 MED ORDER — PHENYTOIN SODIUM EXTENDED 100 MG PO CAPS
200.0000 mg | ORAL_CAPSULE | ORAL | Status: DC
  Administered 2012-10-28: 200 mg via ORAL
  Filled 2012-10-26: qty 2

## 2012-10-26 MED ORDER — ACETAMINOPHEN 325 MG PO TABS: 650.0000 mg | ORAL_TABLET | Freq: Four times a day (QID) | ORAL | Status: DC | PRN

## 2012-10-26 MED ORDER — ATORVASTATIN CALCIUM 40 MG PO TABS
40.0000 mg | ORAL_TABLET | Freq: Every day | ORAL | Status: DC
  Administered 2012-10-27 – 2012-10-30 (×4): 40 mg via ORAL
  Filled 2012-10-26 (×4): qty 1

## 2012-10-26 MED ORDER — ALBUTEROL SULFATE (5 MG/ML) 0.5% IN NEBU: 2.5000 mg | INHALATION_SOLUTION | RESPIRATORY_TRACT | Status: DC | PRN

## 2012-10-26 MED ORDER — COLLAGENASE 250 UNIT/GM EX OINT
TOPICAL_OINTMENT | Freq: Every day | CUTANEOUS | Status: DC
  Administered 2012-10-27 – 2012-10-28 (×2): via TOPICAL
  Administered 2012-10-29: 1 via TOPICAL
  Administered 2012-10-30: 12:00:00 via TOPICAL
  Filled 2012-10-26: qty 30

## 2012-10-26 MED ORDER — FUROSEMIDE 10 MG/ML IJ SOLN
80.0000 mg | Freq: Once | INTRAMUSCULAR | Status: AC
  Administered 2012-10-26: 80 mg via INTRAVENOUS
  Filled 2012-10-26: qty 8

## 2012-10-26 MED ORDER — SODIUM CHLORIDE 0.9 % IJ SOLN
3.0000 mL | Freq: Two times a day (BID) | INTRAMUSCULAR | Status: DC
  Administered 2012-10-26 – 2012-10-30 (×9): 3 mL via INTRAVENOUS

## 2012-10-26 MED ORDER — DOCUSATE SODIUM 100 MG PO CAPS: 100.0000 mg | ORAL_CAPSULE | Freq: Two times a day (BID) | ORAL | Status: DC

## 2012-10-26 MED ORDER — ENOXAPARIN SODIUM 30 MG/0.3ML ~~LOC~~ SOLN
30.0000 mg | SUBCUTANEOUS | Status: DC
  Administered 2012-10-26: 30 mg via SUBCUTANEOUS
  Filled 2012-10-26: qty 0.3

## 2012-10-26 MED ORDER — FUROSEMIDE 10 MG/ML IJ SOLN
80.0000 mg | Freq: Three times a day (TID) | INTRAMUSCULAR | Status: DC
  Administered 2012-10-26 – 2012-10-28 (×6): 80 mg via INTRAVENOUS
  Filled 2012-10-26 (×4): qty 8
  Filled 2012-10-26: qty 4
  Filled 2012-10-26 (×2): qty 8

## 2012-10-26 MED ORDER — ALBUTEROL SULFATE (5 MG/ML) 0.5% IN NEBU
2.5000 mg | INHALATION_SOLUTION | Freq: Four times a day (QID) | RESPIRATORY_TRACT | Status: DC
  Administered 2012-10-26 – 2012-10-29 (×12): 2.5 mg via RESPIRATORY_TRACT
  Filled 2012-10-26 (×12): qty 0.5

## 2012-10-26 NOTE — ED Provider Notes (Signed)
History  This chart was scribed for Donnetta Hutching, MD by Manuela Schwartz, ED scribe. This patient was seen in room APA04/[facility]04 and the patient's care was started at 0815.   CSN: [id number]  Arrival date & time 10/26/12  4098   First MD Initiated Contact with Patient 10/26/12 0845      Chief Complaint  Patient presents with  . abd swelling   . Dysuria   Patient is a 66 y.o. male presenting with dysuria and shortness of breath. The history is provided by the patient. No language interpreter was used.  Dysuria  This is a new problem. The current episode started 2 days ago. The problem occurs every urination. The problem has not changed since onset.The quality of the pain is described as burning. The pain is mild. There has been no fever. Pertinent negatives include no chills, no nausea and no vomiting. He has tried nothing for the symptoms. His past medical history is significant for single kidney.  Shortness of Breath  The current episode started 2 days ago. The problem occurs continuously. The problem has been gradually worsening. The problem is mild. Nothing relieves the symptoms. The symptoms are aggravated by activity. Associated symptoms include shortness of breath. Pertinent negatives include no chest pain and no fever. There was no intake of a foreign body. Urine output has decreased.   [name] [name] is a 66 y.o. male who presents to the Emergency Department w/hx of COPD complaining of constant gradually worsening shortness of breath over the past several days associated with increased swelling of BLE. He states his abdomen feels swollen and tight which makes it somewhat uncomfortable to breathe.  He also complains of constant dysuria the past 2 days where 3 days ago he reports he actually had frequency along with increased amounts of urine but the past 2 days has had decreased frequency and decreased urine amounts. He states in 2010 had right sided nephrectomy, he is not diabetic and has never  been on dialysis. He denies hx of liver problems.   His PCP is Dr. Felecia Shelling  Past Medical History  Diagnosis Date  . Chronic bronchitis   . Hypertension   . Cellulitis of lower leg   . Renal disorder   . COPD (chronic obstructive pulmonary disease)   . Sleep apnea   . Thyroid disease     Past Surgical History  Procedure Date  . Brain surgery   . Brain tumor excision   . Nephrectomy     No family history on file.  History  Substance Use Topics  . Smoking status: Former Smoker    Types: Cigarettes  . Smokeless tobacco: Not on file  . Alcohol Use: No      Review of Systems  Constitutional: Negative for fever and chills.  Respiratory: Positive for shortness of breath.   Cardiovascular: Positive for leg swelling. Negative for chest pain.  Gastrointestinal: Positive for abdominal distention. Negative for nausea and vomiting.  Genitourinary: Positive for dysuria and decreased urine volume.  Neurological: Negative for weakness.  All other systems reviewed and are negative.    Allergies  Review of patient's allergies indicates no known allergies.  Home Medications   Current Outpatient Rx  Name  Route  Sig  Dispense  Refill  . ALBUTEROL SULFATE HFA 108 (90 BASE) MCG/ACT IN AERS   Inhalation   Inhale 2 puffs into the lungs every 6 (six) hours as needed. Bronchitis         . BUDESONIDE-FORMOTEROL FUMARATE  160-4.5 MCG/ACT IN AERO   Inhalation   Inhale 2 puffs into the lungs 2 (two) times daily.         Marland Kitchen CALCITRIOL 0.25 MCG PO CAPS   Oral   Take 1 capsule (0.25 mcg total) by mouth 3 (three) times a week.   30 capsule   3   . CLONIDINE HCL 0.1 MG PO TABS   Oral   Take 0.1 mg by mouth 3 (three) times daily.         . COLLAGENASE 250 UNIT/GM EX OINT   Topical   Apply topically daily.   15 g   3   . DESMOPRESSIN ACETATE 0.2 MG PO TABS   Oral   Take 0.2 mg by mouth 2 (two) times daily.         Marland Kitchen DEXAMETHASONE 0.5 MG PO TABS   Oral   Take 0.5 mg by  mouth daily.         Marland Kitchen HYDRALAZINE HCL 25 MG PO TABS   Oral   Take 25 mg by mouth 2 (two) times daily.         Marland Kitchen LEVOTHYROXINE SODIUM 150 MCG PO TABS   Oral   Take 150 mcg by mouth daily.         Marland Kitchen PHENYTOIN SODIUM EXTENDED 100 MG PO CAPS   Oral   Take 200-300 mg by mouth daily. Takes 200 mg on Monday and Thursdsay. On all other days of the week takes 300 mg.         . SIMVASTATIN 80 MG PO TABS   Oral   Take 80 mg by mouth at bedtime.         Marland Kitchen TIOTROPIUM BROMIDE MONOHYDRATE 18 MCG IN CAPS   Inhalation   Place 18 mcg into inhaler and inhale daily.         . TORSEMIDE 20 MG PO TABS   Oral   Take 2 tablets (40 mg total) by mouth daily.   60 tablet   2     Triage Vitals: BP 150/75  Pulse 106  Temp 98.7 F (37.1 C) (Oral)  Resp 21  SpO2 95%  Physical Exam  Nursing note and vitals reviewed. Constitutional: [name] oriented to person, place, and time. He appears well-developed and well-nourished.       obesity  HENT:  Head: Normocephalic and atraumatic.  Eyes: Conjunctivae normal and EOM are normal. Pupils are equal, round, and reactive to light.  Neck: Normal range of motion. Neck supple.  Cardiovascular: Normal rate, regular rhythm and normal heart sounds.   Pulmonary/Chest: Breath sounds normal. [name] in respiratory distress (dyspneic).  Abdominal: Soft. Bowel sounds are normal. He exhibits distension. There is no tenderness. There is no rebound and no guarding.       Abdomen protuberent and tense  Musculoskeletal: Normal range of motion. He exhibits edema (BLE 4+ edematous ). He exhibits no tenderness.       Peripheral edema  Neurological: [name] alert and oriented to person, place, and time.  Skin: Skin is warm and dry.  Psychiatric: He has a normal mood and affect.    ED Course  Procedures (including critical care time) DIAGNOSTIC STUDIES: Oxygen Saturation is 95% on room air, adequate by my interpretation.    COORDINATION OF CARE: At 920 AM  Discussed treatment plan with patient which includes blood work, BNP, CXR, lasix, cardiac markers, UA, EKG. Patient agrees.   Labs Reviewed  URINALYSIS, ROUTINE W REFLEX  MICROSCOPIC - Abnormal; Notable for the following:    Hgb urine dipstick TRACE (*)     Protein, ur 100 (*)     All other components within normal limits  CBC WITH DIFFERENTIAL - Abnormal; Notable for the following:    RDW 17.3 (*)     Eosinophils Relative 7 (*)     All other components within normal limits  BASIC METABOLIC PANEL - Abnormal; Notable for the following:    BUN 32 (*)     Creatinine, Ser 2.35 (*)     GFR calc non Af Amer 27 (*)     GFR calc Af Amer 31 (*)     All other components within normal limits  PRO B NATRIURETIC PEPTIDE - Abnormal; Notable for the following:    Pro B Natriuretic peptide (BNP) 9706.0 (*)     All other components within normal limits  URINE MICROSCOPIC-ADD ON  TROPONIN I   Dg Chest Port 1 View  10/26/2012  *RADIOLOGY REPORT*  Clinical Data: Shortness of breath, peripheral edema  PORTABLE CHEST - 1 VIEW  Comparison: Portable chest x-ray of 09/26/2012  Findings: The lungs are not as well aerated.  There is moderate cardiomegaly present.  There is a right pleural effusion noted and perhaps mild pulmonary vascular congestion.  IMPRESSION: Moderate cardiomegaly.  Poor inspiration with right effusion and probable mild pulmonary vascular congestion.   Original Report Authenticated By: Dwyane Dee, M.D.      No diagnosis found.   Date: 10/26/2012  Rate:104  Rhythm: sinus tachycardia  QRS Axis: normal  Intervals: normal  ST/T Wave abnormalities: normal  Conduction Disutrbances:right bundle branch block;  Left ant fasicular block  Narrative Interpretation:   Old EKG Reviewed: changes noted  CRITICAL CARE Performed by: Donnetta Hutching  ?  Total critical care time:30  Critical care time was exclusive of separately billable procedures and treating other patients.  Critical care was  necessary to treat or prevent imminent or life-threatening deterioration.  Critical care was time spent personally by me on the following activities: development of treatment plan with patient and/or surrogate as well as nursing, discussions with consultants, evaluation of patient's response to treatment, examination of patient, obtaining history from patient or surrogate, ordering and performing treatments and interventions, ordering and review of laboratory studies, ordering and review of radiographic studies, pulse oximetry and re-evaluation of patient's condition.  MDM  History and physical consistent with pulmonary edema/congestive heart failure. Chest x-ray confirms same along with right pleural effusion. IV Lasix 80 mg given.  Good urinary output. Patient is on supplemental oxygen. Admit to Dr. Oletta Lamas, MD 10/26/12 [phone]

## 2012-10-26 NOTE — H&P (Signed)
[name] [name] MRN: [id number] DOB/AGE: 68/28/1947 68 y.o. Primary Care Physician:FANTA,TESFAYE, MD Admit date: 10/26/2012 Chief Complaint: Shortness of breath HPI: This is a 67 year old who was in the hospital about 2 weeks ago with increasing problems with shortness of breath. He was eventually discharged but says he started having trouble with in the last 3-4 days with increasing shortness of breath. He has also noticed he is having more swelling than before. He has no other complaints. He has not had any chest pain. He has difficult lying flat to sleep  Past Medical History  Diagnosis Date  . Chronic bronchitis   . Hypertension   . Cellulitis of lower leg   . Renal disorder   . COPD (chronic obstructive pulmonary disease)   . Sleep apnea   . Thyroid disease    Past Surgical History  Procedure Date  . Brain surgery   . Brain tumor excision   . Nephrectomy         No family history on file.  Social History:  reports that he has quit smoking. His smoking use included Cigarettes. He does not have any smokeless tobacco history on file. He reports that he does not drink alcohol or use illicit drugs.   Allergies: No Known Allergies  Medications Prior to Admission  Medication Sig Dispense Refill  . albuterol (PROVENTIL HFA;VENTOLIN HFA) 108 (90 BASE) MCG/ACT inhaler Inhale 2 puffs into the lungs every 6 (six) hours as needed. Bronchitis      . budesonide-formoterol (SYMBICORT) 160-4.5 MCG/ACT inhaler Inhale 2 puffs into the lungs 2 (two) times daily.      . calcitRIOL (ROCALTROL) 0.25 MCG capsule Take 1 capsule (0.25 mcg total) by mouth 3 (three) times a week.  30 capsule  3  . cloNIDine (CATAPRES) 0.1 MG tablet Take 0.1 mg by mouth 3 (three) times daily.      . collagenase (SANTYL) ointment Apply topically daily.  15 g  3  . desmopressin (DDAVP) 0.2 MG tablet Take 0.2 mg by mouth 2 (two) times daily.      Marland Kitchen dexamethasone (DECADRON) 0.5 MG tablet Take 0.5 mg by mouth daily.        . hydrALAZINE (APRESOLINE) 25 MG tablet Take 25 mg by mouth 2 (two) times daily.      Marland Kitchen levothyroxine (SYNTHROID, LEVOTHROID) 150 MCG tablet Take 150 mcg by mouth daily.      . phenytoin (DILANTIN) 100 MG ER capsule Take 200-300 mg by mouth daily. Takes 200 mg on Monday and Thursdsay. On all other days of the week takes 300 mg.      . simvastatin (ZOCOR) 80 MG tablet Take 80 mg by mouth at bedtime.      Marland Kitchen tiotropium (SPIRIVA) 18 MCG inhalation capsule Place 18 mcg into inhaler and inhale daily.      Marland Kitchen torsemide (DEMADEX) 20 MG tablet Take 2 tablets (40 mg total) by mouth daily.  60 tablet  2       WUJ:WJXBJ from the symptoms mentioned above,there are no other symptoms referable to all systems reviewed.  Physical Exam: Blood pressure 155/67, pulse 106, temperature 99.6 F (37.6 C), temperature source Oral, resp. rate 24, height 6\' 1"  (1.854 m), weight 134 kg (295 lb 6.7 oz), SpO2 94.00%. [name] awake and alert. He looks mildly uncomfortable. His pupils are reactive. His nose and throat are clear. His neck is supple. He has some JVD when he sitting upright. His chest shows rales in the bases bilaterally. His heart  is regular. His abdomen is soft without masses. Extremities showed chronic venous stasis changes and some ulcerations as well as 2-3+ pitting edema. Central nervous system exam is grossly intact    Basename 10/26/12 0839  WBC 8.4  NEUTROABS 5.2  HGB 13.3  HCT 41.2  MCV 91.8  PLT 192    Basename 10/26/12 0839  NA 138  K 4.9  CL 105  CO2 25  GLUCOSE 99  BUN 32*  CREATININE 2.35*  CALCIUM 8.9  MG --  lablast2(ast:2,ALT:2,alkphos:2,bilitot:2,prot:2,albumin:2)@    No results found for this or any previous visit (from the past 240 hour(s)).   Dg Chest Port 1 View  10/26/2012  *RADIOLOGY REPORT*  Clinical Data: Shortness of breath, peripheral edema  PORTABLE CHEST - 1 VIEW  Comparison: Portable chest x-ray of 09/26/2012  Findings: The lungs are not as well aerated.   There is moderate cardiomegaly present.  There is a right pleural effusion noted and perhaps mild pulmonary vascular congestion.  IMPRESSION: Moderate cardiomegaly.  Poor inspiration with right effusion and probable mild pulmonary vascular congestion.   Original Report Authenticated By: Dwyane Dee, M.D.    Impression: I think this is mostly congestive heart failure. He has multiple other medical problems as well. 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 He has sleep apnea but refuses CPAP he has chronic leg ulceration Active Problems:  * No active hospital problems. *      Plan: He's going to be treated with IV diuretics. Continue his regular medications.      Afifa Truax L Pager [phone]  10/26/2012, 6:16 PM

## 2012-10-26 NOTE — ED Notes (Signed)
Pt reports since the weekend has had abd swelling and painful urination.  Says is able to void but only "trickles."

## 2012-10-26 NOTE — ED Notes (Signed)
Dr. Adriana Simas says is waiting for Dr Felecia Shelling to call him back.

## 2012-10-26 NOTE — ED Notes (Signed)
asssited pt up to bsc, pt has large, loose bm and voided unknown amount.  Pt back in bed, became very SOB after exertion.  02 sat decreased to 88%.  Turned o2 up to 3L and 02 sat increased to 94%.   Pt says feels better.

## 2012-10-26 NOTE — ED Notes (Signed)
Pt had wet gown and bed, cleaned pt and bed and changed gown and linens.

## 2012-10-26 NOTE — Progress Notes (Signed)
Pt states that he is having difficulty emptying bladder. Bladder scan was attempted, but unsuccessful d/t abdominal edema. Will notify Dr Juanetta Gosling on rounds this evening. Pt has voided 300 cc since 4pm.

## 2012-10-26 NOTE — ED Notes (Signed)
Dr. Adriana Simas reevaluating pt.

## 2012-10-27 LAB — CBC
HCT: 41.5 % (ref 39.0–52.0)
MCV: 91.6 fL (ref 78.0–100.0)
RDW: 17.2 % — ABNORMAL HIGH (ref 11.5–15.5)
WBC: 8 10*3/uL (ref 4.0–10.5)

## 2012-10-27 LAB — BASIC METABOLIC PANEL
BUN: 33 mg/dL — ABNORMAL HIGH (ref 6–23)
CO2: 25 mEq/L (ref 19–32)
Chloride: 105 mEq/L (ref 96–112)
Creatinine, Ser: 2.5 mg/dL — ABNORMAL HIGH (ref 0.50–1.35)

## 2012-10-27 MED ORDER — ENOXAPARIN SODIUM 60 MG/0.6ML ~~LOC~~ SOLN
60.0000 mg | SUBCUTANEOUS | Status: DC
  Administered 2012-10-27 – 2012-10-30 (×4): 60 mg via SUBCUTANEOUS
  Filled 2012-10-27 (×4): qty 0.6

## 2012-10-27 MED ORDER — SODIUM CHLORIDE 0.9 % IN NEBU
INHALATION_SOLUTION | RESPIRATORY_TRACT | Status: AC
  Administered 2012-10-27: 3 mL
  Filled 2012-10-27: qty 3

## 2012-10-27 NOTE — Progress Notes (Signed)
UR Chart Review Completed  

## 2012-10-27 NOTE — Clinical Documentation Improvement (Signed)
CHF DOCUMENTATION CLARIFICATION QUERY  THIS DOCUMENT IS NOT A PERMANENT PART OF THE MEDICAL RECORD  TO RESPOND TO THE THIS QUERY, FOLLOW THE INSTRUCTIONS BELOW:  1. If needed, update documentation for the patient's encounter via the notes activity.  2. Access this query again and click edit on the In [employer].  3. After updating, or not, click F2 to complete all highlighted (required) fields concerning your review. Select "additional documentation in the medical record" OR "no additional documentation provided".  4. Click Sign note button.  5. The deficiency will fall out of your In Basket *Please let us know if you are not able to complete this workflow by phone or e-mail (listed below).  Please update your documentation within the medical record to reflect your response to this query.                                                                                    10/27/12  Dear [facility]. [facility],  In a better effort to capture your patient's severity of illness, reflect appropriate length of stay and utilization of resources, a review of the patient medical record has revealed the following indicators the diagnosis of Heart Failure.   Based on your clinical judgment, please clarify and document in a progress note and/or discharge summary the clinical condition associated with the following supporting information.  In responding to this query please exercise your independent judgment.  The fact that a query is asked, does not imply that any particular answer is desired or expected.  Please clarify type and acuity of CHF  Possible Clinical Conditions?  Chronic Systolic Congestive Heart Failure Chronic Diastolic Congestive Heart Failure Chronic Systolic & Diastolic Congestive Heart Failure Acute Systolic Congestive Heart Failure Acute Diastolic Congestive Heart Failure Acute Systolic & Diastolic Congestive Heart Failure Acute on Chronic Systolic Congestive Heart  Failure Acute on Chronic Diastolic Congestive Heart Failure Acute on Chronic Systolic & Diastolic  Congestive Heart Failure Other Condition________________________________________ Cannot Clinically Determine  Clinical Information:   Risk Factors: Admitted with pulmonary edema/CHF History of hypertension Obesity  Signs & Symptoms: Bilateral lower extremity edema - 2-3+pitting increasing Shortness of breath  Diagnostics: Lab: BNP: 9706  Echo results: 09/08/12 EF: 55-60%  EKG: Sinus Tach  Radiology: 10/26/12 CXR=IMPRESSION: Moderate cardiomegaly. Poor inspiration with right effusion and probable mild pulmonary vascular congestion.  Treatment: Daily weights I&O qshift Diuretics: IV Lasix 80mg  q8h  Reviewed: additional documentation in the medical record by Joni Reining NP-Acute diastolic chf  Thank You,  Harless Litten RN, MSN Clinical Documentation Specialist: Office# [phone] [facility] Health Information Management [place]

## 2012-10-27 NOTE — Progress Notes (Signed)
Subjective: He says he feels much better. He has no new complaints. His breathing is better. [name] having some loose stools  Objective: Vital signs in last 24 hours: Temp:  [97.6 F (36.4 C)-99.6 F (37.6 C)] 98.1 F (36.7 C) (11/20 0410) Pulse Rate:  [72-106] 101  (11/20 0600) Resp:  [16-28] 22  (11/20 0600) BP: (129-174)/(55-101) 146/78 mmHg (11/20 0600) SpO2:  [91 %-100 %] 96 % (11/20 0600) Weight:  [129.6 kg (285 lb 11.5 oz)-134 kg (295 lb 6.7 oz)] 129.6 kg (285 lb 11.5 oz) (11/20 0432) Weight change:  Last BM Date: 10/26/12  Intake/Output from previous day: 11/19 0701 - 11/20 0700 In: 259 [P.O.:240; I.V.:3; IV Piggyback:16] Out: 4270 [Urine:4270]  PHYSICAL EXAM General appearance: alert, cooperative, no distress and morbidly obese Resp: rhonchi bilaterally Cardio: regular rate and rhythm, S1, S2 normal, no murmur, click, rub or gallop GI: soft, non-tender; bowel sounds normal; no masses,  no organomegaly Extremities: Chronic edema venous stasis and leg ulceration  Lab Results:    Basic Metabolic Panel:  Basename 10/27/12 0504 10/26/12 0839  NA 140 138  K 5.2* 4.9  CL 105 105  CO2 25 25  GLUCOSE 87 99  BUN 33* 32*  CREATININE 2.50* 2.35*  CALCIUM 8.9 8.9  MG -- --  PHOS -- --   Liver Function Tests: No results found for this basename: AST:2,ALT:2,ALKPHOS:2,BILITOT:2,PROT:2,ALBUMIN:2 in the last 72 hours No results found for this basename: LIPASE:2,AMYLASE:2 in the last 72 hours No results found for this basename: AMMONIA:2 in the last 72 hours CBC:  Basename 10/27/12 0504 10/26/12 0839  WBC 8.0 8.4  NEUTROABS -- 5.2  HGB 13.3 13.3  HCT 41.5 41.2  MCV 91.6 91.8  PLT 177 192   Cardiac Enzymes:  Basename 10/26/12 0839  CKTOTAL --  CKMB --  CKMBINDEX --  TROPONINI <0.30   BNP:  Basename 10/26/12 0839  PROBNP 9706.0*   D-Dimer: No results found for this basename: DDIMER:2 in the last 72 hours CBG: No results found for this basename: GLUCAP:6  in the last 72 hours Hemoglobin A1C: No results found for this basename: HGBA1C in the last 72 hours Fasting Lipid Panel: No results found for this basename: CHOL,HDL,LDLCALC,TRIG,CHOLHDL,LDLDIRECT in the last 72 hours Thyroid Function Tests: No results found for this basename: TSH,T4TOTAL,FREET4,T3FREE,THYROIDAB in the last 72 hours Anemia Panel: No results found for this basename: VITAMINB12,FOLATE,FERRITIN,TIBC,IRON,RETICCTPCT in the last 72 hours Coagulation: No results found for this basename: LABPROT:2,INR:2 in the last 72 hours Urine Drug Screen: Drugs of Abuse  No results found for this basename: labopia, cocainscrnur, labbenz, amphetmu, thcu, labbarb    Alcohol Level: No results found for this basename: ETH:2 in the last 72 hours Urinalysis:  Basename 10/26/12 0900  COLORURINE YELLOW  LABSPEC 1.025  PHURINE 6.0  GLUCOSEU NEGATIVE  HGBUR TRACE*  BILIRUBINUR NEGATIVE  KETONESUR NEGATIVE  PROTEINUR 100*  UROBILINOGEN 0.2  NITRITE NEGATIVE  LEUKOCYTESUR NEGATIVE   Misc. Labs:  ABGS No results found for this basename: PHART,PCO2,PO2ART,TCO2,HCO3 in the last 72 hours CULTURES No results found for this or any previous visit (from the past 240 hour(s)). Studies/Results: Dg Chest Port 1 View  10/26/2012  *RADIOLOGY REPORT*  Clinical Data: Shortness of breath, peripheral edema  PORTABLE CHEST - 1 VIEW  Comparison: Portable chest x-ray of 09/26/2012  Findings: The lungs are not as well aerated.  There is moderate cardiomegaly present.  There is a right pleural effusion noted and perhaps mild pulmonary vascular congestion.  IMPRESSION: Moderate cardiomegaly.  Poor inspiration with right effusion and probable mild pulmonary vascular congestion.   Original Report Authenticated By: Dwyane Dee, M.D.     Medications:  Prior to Admission:  Prescriptions prior to admission  Medication Sig Dispense Refill  . albuterol (PROVENTIL HFA;VENTOLIN HFA) 108 (90 BASE) MCG/ACT inhaler  Inhale 2 puffs into the lungs every 6 (six) hours as needed. Bronchitis      . budesonide-formoterol (SYMBICORT) 160-4.5 MCG/ACT inhaler Inhale 2 puffs into the lungs 2 (two) times daily.      . calcitRIOL (ROCALTROL) 0.25 MCG capsule Take 1 capsule (0.25 mcg total) by mouth 3 (three) times a week.  30 capsule  3  . cloNIDine (CATAPRES) 0.1 MG tablet Take 0.1 mg by mouth 3 (three) times daily.      . collagenase (SANTYL) ointment Apply topically daily.  15 g  3  . desmopressin (DDAVP) 0.2 MG tablet Take 0.2 mg by mouth 2 (two) times daily.      Marland Kitchen dexamethasone (DECADRON) 0.5 MG tablet Take 0.5 mg by mouth daily.      . hydrALAZINE (APRESOLINE) 25 MG tablet Take 25 mg by mouth 2 (two) times daily.      Marland Kitchen levothyroxine (SYNTHROID, LEVOTHROID) 150 MCG tablet Take 150 mcg by mouth daily.      . phenytoin (DILANTIN) 100 MG ER capsule Take 200-300 mg by mouth daily. Takes 200 mg on Monday and Thursdsay. On all other days of the week takes 300 mg.      . simvastatin (ZOCOR) 80 MG tablet Take 80 mg by mouth at bedtime.      Marland Kitchen tiotropium (SPIRIVA) 18 MCG inhalation capsule Place 18 mcg into inhaler and inhale daily.      Marland Kitchen torsemide (DEMADEX) 20 MG tablet Take 2 tablets (40 mg total) by mouth daily.  60 tablet  2   Scheduled:   . albuterol  2.5 mg Nebulization Q6H  . atorvastatin  40 mg Oral q1800  . budesonide-formoterol  2 puff Inhalation BID  . calcitRIOL  0.25 mcg Oral 3 times weekly  . cloNIDine  0.1 mg Oral TID  . collagenase   Topical Daily  . desmopressin  0.2 mg Oral BID  . dexamethasone  0.5 mg Oral Daily  . docusate sodium  100 mg Oral BID  . enoxaparin (LOVENOX) injection  30 mg Subcutaneous Q24H  . [COMPLETED] furosemide  80 mg Intravenous Once  . furosemide  80 mg Intravenous Q8H  . hydrALAZINE  25 mg Oral BID  . levothyroxine  150 mcg Oral Daily  . phenytoin  200 mg Oral Q Mon  . phenytoin  200 mg Oral Q Thu  . phenytoin  300 mg Oral Custom  . [COMPLETED] phenytoin  300 mg Oral  Once  . sodium chloride  3 mL Intravenous Q12H  . sodium chloride      . tiotropium  18 mcg Inhalation Daily   Continuous:  JYN:WGNFAO chloride, acetaminophen, acetaminophen, albuterol, alum & mag hydroxide-simeth, HYDROcodone-acetaminophen, ondansetron (ZOFRAN) IV, ondansetron, sodium chloride, traZODone  Assesment: [name] admitted with what appears to be CHF. [name] doing better. I'm going to see when he had the last echocardiogram. If it's not been within the last year or so I will repeat. In the meantime continue with his IV diuresis and he is to have stools checked for C. difficile because of his diarrhea Active Problems:  * No active hospital problems. *     Plan: As above    LOS: 1  day   Dayana Dalporto L 10/27/2012, 7:54 AM

## 2012-10-28 ENCOUNTER — Encounter (HOSPITAL_COMMUNITY): Payer: Self-pay | Admitting: Adult Health

## 2012-10-28 DIAGNOSIS — L0390 Cellulitis, unspecified: Secondary | ICD-10-CM | POA: Diagnosis present

## 2012-10-28 DIAGNOSIS — E669 Obesity, unspecified: Secondary | ICD-10-CM

## 2012-10-28 DIAGNOSIS — N289 Disorder of kidney and ureter, unspecified: Secondary | ICD-10-CM

## 2012-10-28 DIAGNOSIS — I509 Heart failure, unspecified: Secondary | ICD-10-CM

## 2012-10-28 DIAGNOSIS — I10 Essential (primary) hypertension: Secondary | ICD-10-CM

## 2012-10-28 HISTORY — DX: Obesity, unspecified: E66.9

## 2012-10-28 LAB — BASIC METABOLIC PANEL
BUN: 35 mg/dL — ABNORMAL HIGH (ref 6–23)
Chloride: 105 mEq/L (ref 96–112)
GFR calc non Af Amer: 26 mL/min — ABNORMAL LOW (ref >90)
Glucose, Bld: 100 mg/dL — ABNORMAL HIGH (ref 70–99)
Potassium: 3.9 mEq/L (ref 3.5–5.1)

## 2012-10-28 MED ORDER — LOPERAMIDE HCL 2 MG PO CAPS: 2.0000 mg | ORAL_CAPSULE | ORAL | Status: DC | PRN

## 2012-10-28 MED ORDER — FUROSEMIDE 10 MG/ML IJ SOLN: 40.0000 mg | Freq: Two times a day (BID) | INTRAMUSCULAR | Status: DC

## 2012-10-28 MED ORDER — FUROSEMIDE 10 MG/ML IJ SOLN
40.0000 mg | Freq: Three times a day (TID) | INTRAMUSCULAR | Status: DC
  Administered 2012-10-28 – 2012-10-31 (×8): 40 mg via INTRAVENOUS
  Filled 2012-10-28 (×8): qty 4

## 2012-10-28 NOTE — Consult Note (Signed)
  [id number] 

## 2012-10-28 NOTE — Consult Note (Signed)
CARDIOLOGY CONSULT NOTE  Patient ID: [name] [name] MRN: [id number] DOB/AGE: 68-06-1946 68 y.o.  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 Felecia Shelling, with no prior cardiac history, who was admitted with progressive dyspnea, wt gain, and edema occuring over two week's time. He was recently admitted to [facility] on Sep 27, 2002 for exacerbation of COPD and treatment of cellulitis of the left lower leg. He was sent to the [facility] for PT, on steroids and antibiotics. He states he began to notice that his abdomen was beginning to get larger and become tight. He began to have increased DOE, and at rest prompting evaluation in the emergency department.    In ER BNP was drawn and found to be 9,706. Creatinine 2.39. CXR demonstrated mderate cardiomegaly, poor inspiration with right effusion and probable mild pulmonary vascular congestion. His weight was found to be up 17 lbs from recorded weight on discharge two weeks earlier. He was given IV lasix and has diuresed 10-15 lbs depending on which weights are accurate. [name] breathing better and is more comfortable.    Echocardiogram was completed on last hospitalization demonstrating LVH, EF of 55%-60%, RV size was normal. Pulmonary pressure was mildly elevated.    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 He denies chest pressure, dizziness, nausea, but admits to early satiety. He has no pain in the left leg due to  cellulitis which is continuing to be treated.  Review of systems complete and found to be negative unless listed above   Past Medical History  Diagnosis Date  . Chronic bronchitis   . Hypertension   . Cellulitis of lower leg   . Renal disorder   . COPD (chronic obstructive pulmonary disease)   . Sleep apnea   . Thyroid disease     Family History  Problem Relation Age of Onset  . Cancer Mother   . Cancer Father   . Cancer Sister   . Heart failure Sister   . Cancer Brother     History   Social History  . Marital Status: Divorced    Spouse Name: N/A    Number of Children: N/A  . Years of Education: N/A   Occupational History  . Retired     Scientist, product/process development   Social History Main Topics  . Smoking status: Former Smoker    Types: Cigarettes  . Smokeless tobacco: Not on file  . Alcohol Use: No  . Drug Use: No  . Sexually Active:    Other Topics Concern  . Not on file   Social History Narrative   Lives in [place] alone with good family support from his daughter.    Past Surgical History  Procedure Date  . Brain surgery   . Brain tumor excision   . Nephrectomy     Prescriptions prior to admission  Medication Sig Dispense Refill  . albuterol (PROVENTIL HFA;VENTOLIN HFA) 108 (90 BASE) MCG/ACT inhaler Inhale 2 puffs into the lungs every 6 (six) hours as needed. Bronchitis      .  budesonide-formoterol (SYMBICORT) 160-4.5 MCG/ACT inhaler Inhale 2 puffs into the lungs 2 (two) times daily.      . calcitRIOL (ROCALTROL) 0.25 MCG capsule Take 1 capsule (0.25 mcg total) by mouth 3 (three) times a week.  30 capsule  3  . cloNIDine (CATAPRES) 0.1 MG tablet Take 0.1 mg by mouth 3 (three) times daily.      . collagenase (SANTYL) ointment Apply topically daily.  15 g  3  . desmopressin (DDAVP) 0.2 MG tablet Take 0.2 mg by mouth 2 (two) times daily.      Marland Kitchen dexamethasone (DECADRON) 0.5 MG tablet Take 0.5 mg by mouth daily.      . hydrALAZINE (APRESOLINE) 25 MG tablet Take 25  mg by mouth 2 (two) times daily.      Marland Kitchen levothyroxine (SYNTHROID, LEVOTHROID) 150 MCG tablet Take 150 mcg by mouth daily.      . phenytoin (DILANTIN) 100 MG ER capsule Take 200-300 mg by mouth daily. Takes 200 mg on Monday and Thursdsay. On all other days of the week takes 300 mg.      . simvastatin (ZOCOR) 80 MG tablet Take 80 mg by mouth at bedtime.      Marland Kitchen tiotropium (SPIRIVA) 18 MCG inhalation capsule Place 18 mcg into inhaler and inhale daily.      Marland Kitchen torsemide (DEMADEX) 20 MG tablet Take 2 tablets (40 mg total) by mouth daily.  60 tablet  2   Physical Exam: Blood pressure 140/73, pulse 90, temperature 98.6 F (37 C), temperature source Oral, resp. rate 20, height 6\' 1"  (1.854 m), weight 277 lb 5.4 oz (125.8 kg), SpO2 99.00%.   General: Well developed, well nourished, morbidly obese in no acute distress Head: Eyes PERRLA, No xanthomas.   Normal cephalic and atramatic  Lungs: tachypnea, expiratory rhonchi with prolonged expiratory phase; scattered crackles throughout Heart: HRRR distant S1 and S2, distant heart sounds, past systolic and diastolic murmurs at the cardiac base.  Pulses are 2+ & equal radial, No carotid bruit.  No JVD.  No abdominal bruits. No femoral bruits. Abdomen: Bowel sounds are positive, abdomen mildly distended and non-tender without masses Msk:  Back normal, lumbering, slow gait. Normal strength and tone for age Gauze wrap to left lower leg.  Bilaterally diminished pulses. Extremities: No clubbing, cyanosis or 2+ pitting edema pretibial and into feet.  DP +1 Neuro: Alert and oriented X 3. Psych:  Good affect, responds appropriately   Lab Results  Component Value Date   WBC 8.0 10/27/2012   HGB 13.3 10/27/2012   HCT 41.5 10/27/2012   MCV 91.6 10/27/2012   PLT 177 10/27/2012    Lab 10/28/12 0432  NA 139  K 3.9  CL 105  CO2 25  BUN 35*  CREATININE 2.39*  CALCIUM 8.1*  PROT --  BILITOT --  ALKPHOS --  ALT --  AST --  GLUCOSE 100*   Lab Results    Component Value Date   TROPONINI <0.30 10/26/2012      ECHOCARDIOGRAM 09/08/2012: Mild left ventricular enlargement, moderate LVH, EF of 55-60%, moderate distal inferolateral hypokinesis, mild to moderate AI, moderate aortic root and descending aortic dilatation, mild to moderate left and right atrial enlargement, normal RV size, mild RVH, normal RV function, mildly elevated right-sided pressure  EKG: Sinus tachycardia, RBBB, LAFB, low-voltage, possible prior anteroseptal MI.  ASSESSMENT AND PLAN:   1. Acute Diastolic CHF: Can consider right heart failure as well with elevated pulmonary pressures in the setting of COPD and OSA.  He began to gain the weight approximately 2 weeks ago with symptoms becoming progressive over one weeks time. [name] now diuresing, feeling and breathing better. He will need a minimum of 10 lbs more diuresed, as he remains distended in abdomen and  lower extremities. [name] on Lasix 80 mg  Q 8 hrs, with discuss  the need to repeat echo, limited study, with Dr. Dietrich Pates secondary to this acute event, in the setting of COPD and OSA.  His troponin was negative, and EKG did not show acute ischemia. He does have some conduction abnormalities. TSH was completed in Jan of 2012, with result of 0.993.  2. Chronic Renal insufficiency.:  Baseline Creatinine appears to be 2.23-2.25 on review of prior labs. He has a single left kidney, and therefore careful monitoring of his kidney status is being done.   3. Hypertension: Review of BP trend shows improvement since admission with diureses and continuation of clonidine and hydralazine.   [name]. [name] NP [facility] 10/28/2012, 9:33 AM  Cardiology Attending Patient interviewed and examined. Discussed with Joni Reining, NP.  Above note annotated and modified based upon my findings.  Patient presents with predominantly right-sided congestive heart failure, although neck veins do not appear dilated.  Fluid overload likely  more related to chronic lung disease and possible pulmonary hypertension plus chronic kidney disease than to cardiac issues.  He needs continued diuresis as permitted by chronic kidney disease.  So far, renal function has remained stable.  He will need adequate diuretic at the time of discharge.  Since his EKG has changed, likely as the result of lead placement, echocardiogram will be repeated.  [place] Bing, MD 10/28/2012, 6:22 PM

## 2012-10-28 NOTE — Progress Notes (Signed)
Subjective: He says he feels well. [name] still having some diarrhea. His legs are much improved. [name] not as short of breath.  Objective: Vital signs in last 24 hours: Temp:  [97.8 F (36.6 C)-98.6 F (37 C)] 98.6 F (37 C) (11/21 0744) Pulse Rate:  [64-97] 90  (11/21 0600) Resp:  [12-26] 20  (11/21 0600) BP: (118-144)/(58-77) 140/73 mmHg (11/21 0400) SpO2:  [92 %-100 %] 99 % (11/21 0703) Weight:  [125.8 kg (277 lb 5.4 oz)] 125.8 kg (277 lb 5.4 oz) (11/21 0500) Weight change: -8.2 kg (-18 lb 1.2 oz) Last BM Date: 10/27/12  Intake/Output from previous day: 11/20 0701 - 11/21 0700 In: 27 [I.V.:3; IV Piggyback:24] Out: 4600 [Urine:4600]  PHYSICAL EXAM General appearance: alert, cooperative, no distress and moderately obese Resp: clear to auscultation bilaterally Cardio: regular rate and rhythm, S1, S2 normal, no murmur, click, rub or gallop GI: soft, non-tender; bowel sounds normal; no masses,  no organomegaly Extremities: extremities normal, atraumatic, no cyanosis or edema  Lab Results:    Basic Metabolic Panel:  Basename 10/28/12 0432 10/27/12 0504  NA 139 140  K 3.9 5.2*  CL 105 105  CO2 25 25  GLUCOSE 100* 87  BUN 35* 33*  CREATININE 2.39* 2.50*  CALCIUM 8.1* 8.9  MG -- --  PHOS -- --   Liver Function Tests: No results found for this basename: AST:2,ALT:2,ALKPHOS:2,BILITOT:2,PROT:2,ALBUMIN:2 in the last 72 hours No results found for this basename: LIPASE:2,AMYLASE:2 in the last 72 hours No results found for this basename: AMMONIA:2 in the last 72 hours CBC:  Basename 10/27/12 0504 10/26/12 0839  WBC 8.0 8.4  NEUTROABS -- 5.2  HGB 13.3 13.3  HCT 41.5 41.2  MCV 91.6 91.8  PLT 177 192   Cardiac Enzymes:  Basename 10/26/12 0839  CKTOTAL --  CKMB --  CKMBINDEX --  TROPONINI <0.30   BNP:  Basename 10/26/12 0839  PROBNP 9706.0*   D-Dimer: No results found for this basename: DDIMER:2 in the last 72 hours CBG: No results found for this basename:  GLUCAP:6 in the last 72 hours Hemoglobin A1C: No results found for this basename: HGBA1C in the last 72 hours Fasting Lipid Panel: No results found for this basename: CHOL,HDL,LDLCALC,TRIG,CHOLHDL,LDLDIRECT in the last 72 hours Thyroid Function Tests: No results found for this basename: TSH,T4TOTAL,FREET4,T3FREE,THYROIDAB in the last 72 hours Anemia Panel: No results found for this basename: VITAMINB12,FOLATE,FERRITIN,TIBC,IRON,RETICCTPCT in the last 72 hours Coagulation: No results found for this basename: LABPROT:2,INR:2 in the last 72 hours Urine Drug Screen: Drugs of Abuse  No results found for this basename: labopia, cocainscrnur, labbenz, amphetmu, thcu, labbarb    Alcohol Level: No results found for this basename: ETH:2 in the last 72 hours Urinalysis:  Basename 10/26/12 0900  COLORURINE YELLOW  LABSPEC 1.025  PHURINE 6.0  GLUCOSEU NEGATIVE  HGBUR TRACE*  BILIRUBINUR NEGATIVE  KETONESUR NEGATIVE  PROTEINUR 100*  UROBILINOGEN 0.2  NITRITE NEGATIVE  LEUKOCYTESUR NEGATIVE   Misc. Labs:  ABGS No results found for this basename: PHART,PCO2,PO2ART,TCO2,HCO3 in the last 72 hours CULTURES Recent Results (from the past 240 hour(s))  CLOSTRIDIUM DIFFICILE BY PCR     Status: Normal   Collection Time   10/27/12  9:14 AM      Component Value Range Status Comment   C difficile by pcr NEGATIVE  NEGATIVE Final    Studies/Results: Dg Chest Port 1 View  10/26/2012  *RADIOLOGY REPORT*  Clinical Data: Shortness of breath, peripheral edema  PORTABLE CHEST - 1 VIEW  Comparison: Portable chest x-ray of 09/26/2012  Findings: The lungs are not as well aerated.  There is moderate cardiomegaly present.  There is a right pleural effusion noted and perhaps mild pulmonary vascular congestion.  IMPRESSION: Moderate cardiomegaly.  Poor inspiration with right effusion and probable mild pulmonary vascular congestion.   Original Report Authenticated By: Dwyane Dee, M.D.     Medications:  Prior  to Admission:  Prescriptions prior to admission  Medication Sig Dispense Refill  . albuterol (PROVENTIL HFA;VENTOLIN HFA) 108 (90 BASE) MCG/ACT inhaler Inhale 2 puffs into the lungs every 6 (six) hours as needed. Bronchitis      . budesonide-formoterol (SYMBICORT) 160-4.5 MCG/ACT inhaler Inhale 2 puffs into the lungs 2 (two) times daily.      . calcitRIOL (ROCALTROL) 0.25 MCG capsule Take 1 capsule (0.25 mcg total) by mouth 3 (three) times a week.  30 capsule  3  . cloNIDine (CATAPRES) 0.1 MG tablet Take 0.1 mg by mouth 3 (three) times daily.      . collagenase (SANTYL) ointment Apply topically daily.  15 g  3  . desmopressin (DDAVP) 0.2 MG tablet Take 0.2 mg by mouth 2 (two) times daily.      Marland Kitchen dexamethasone (DECADRON) 0.5 MG tablet Take 0.5 mg by mouth daily.      . hydrALAZINE (APRESOLINE) 25 MG tablet Take 25 mg by mouth 2 (two) times daily.      Marland Kitchen levothyroxine (SYNTHROID, LEVOTHROID) 150 MCG tablet Take 150 mcg by mouth daily.      . phenytoin (DILANTIN) 100 MG ER capsule Take 200-300 mg by mouth daily. Takes 200 mg on Monday and Thursdsay. On all other days of the week takes 300 mg.      . simvastatin (ZOCOR) 80 MG tablet Take 80 mg by mouth at bedtime.      Marland Kitchen tiotropium (SPIRIVA) 18 MCG inhalation capsule Place 18 mcg into inhaler and inhale daily.      Marland Kitchen torsemide (DEMADEX) 20 MG tablet Take 2 tablets (40 mg total) by mouth daily.  60 tablet  2   Scheduled:   . albuterol  2.5 mg Nebulization Q6H  . atorvastatin  40 mg Oral q1800  . budesonide-formoterol  2 puff Inhalation BID  . calcitRIOL  0.25 mcg Oral 3 times weekly  . cloNIDine  0.1 mg Oral TID  . collagenase   Topical Daily  . desmopressin  0.2 mg Oral BID  . dexamethasone  0.5 mg Oral Daily  . docusate sodium  100 mg Oral BID  . enoxaparin (LOVENOX) injection  60 mg Subcutaneous Q24H  . furosemide  80 mg Intravenous Q8H  . hydrALAZINE  25 mg Oral BID  . levothyroxine  150 mcg Oral Daily  . phenytoin  200 mg Oral Q Mon    . phenytoin  200 mg Oral Q Thu  . phenytoin  300 mg Oral Custom  . sodium chloride  3 mL Intravenous Q12H  . tiotropium  18 mcg Inhalation Daily  . [DISCONTINUED] enoxaparin (LOVENOX) injection  30 mg Subcutaneous Q24H   Continuous:  XBJ:YNWGNF chloride, acetaminophen, acetaminophen, albuterol, alum & mag hydroxide-simeth, HYDROcodone-acetaminophen, ondansetron (ZOFRAN) IV, ondansetron, sodium chloride, traZODone  Assesment: He has CHF. He has chronic venous stasis and leg ulcerations which are better. He has not made contact with a cardiologist for long-term treatment of his CHF and we discussed that and he would like to make that happen which I think is reasonable. 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 Active Problems:  *  No active hospital problems. *     Plan: I think he can transfer from the step down unit and I will ask for cardiology consultation    LOS: 2 days   Shamyah Stantz L 10/28/2012, 8:39 AM

## 2012-10-28 NOTE — Care Management Note (Unsigned)
    Page 1 of 1   10/28/2012     2:41:07 PM   CARE MANAGEMENT NOTE 10/28/2012  Patient:  ESTES, LEHNER   Account Number:  [id number]  Date Initiated:  10/28/2012  Documentation initiated by:  Sharrie Rothman  Subjective/Objective Assessment:   Pt admitted from home with CHF. Pt lives alone but has a daughter and siblings who check on pt frequently. Pt will return home at discharge. Pt has a walker for home use.     Action/Plan:   CM discussed CHF s/s and treatment with pt. CM also gave pt a set of scales so that pt can perform daily wts. Pt is currently active with [facility].   Anticipated DC Date:  10/30/2012   Anticipated DC Plan:  HOME W HOME HEALTH SERVICES  In-house referral  Financial Counselor      DC Planning Services  CM consult      Choice offered to / List presented to:             [facility] agency  [facility]   Status of service:  In process, will continue to follow Medicare Important Message given?   (If response is "NO", the following Medicare IM given date fields will be blank) Date Medicare IM given:   Date Additional Medicare IM given:    Discharge Disposition:    Per UR Regulation:    If discussed at Long Length of Stay Meetings, dates discussed:    Comments:  10/28/12 1440 Arlyss Queen, RN BSN CM

## 2012-10-29 DIAGNOSIS — I5031 Acute diastolic (congestive) heart failure: Principal | ICD-10-CM

## 2012-10-29 DIAGNOSIS — I359 Nonrheumatic aortic valve disorder, unspecified: Secondary | ICD-10-CM

## 2012-10-29 LAB — HEMOGLOBIN A1C
Hgb A1c MFr Bld: 6.1 % — ABNORMAL HIGH (ref <5.7)
Mean Plasma Glucose: 128 mg/dL — ABNORMAL HIGH (ref <117)

## 2012-10-29 LAB — BASIC METABOLIC PANEL
Chloride: 103 mEq/L (ref 96–112)
Creatinine, Ser: 2.35 mg/dL — ABNORMAL HIGH (ref 0.50–1.35)
GFR calc Af Amer: 31 mL/min — ABNORMAL LOW (ref >90)
GFR calc non Af Amer: 27 mL/min — ABNORMAL LOW (ref >90)
Potassium: 4 mEq/L (ref 3.5–5.1)

## 2012-10-29 NOTE — Progress Notes (Addendum)
SUBJECTIVE: Feeling and breathing better. Wants to talk with [facility]   LABS: Basic Metabolic Panel:  [facility] 10/29/12 0505 10/28/12 0432  NA 140 139  K 4.0 3.9  CL 103 105  CO2 28 25  GLUCOSE 94 100*  BUN 34* 35*  CREATININE 2.35* 2.39*  CALCIUM 8.6 8.1*  MG -- --  PHOS -- --    Basename 10/27/12 0504 10/26/12 0839  WBC 8.0 8.4  NEUTROABS -- 5.2  HGB 13.3 13.3  HCT 41.5 41.2  MCV 91.6 91.8  PLT 177 192   Cardiac Enzymes:  Basename 10/26/12 0839  CKTOTAL --  CKMB --  CKMBINDEX --  TROPONINI <0.30   Hemoglobin A1C:  Basename 10/28/12 1720  HGBA1C 6.1*    Basename 10/28/12 1720  TSH 0.427  T4TOTAL --  T3FREE --  THYROIDAB --    RADIOLOGY: IMPRESSION: 10/26/2012 Moderate cardiomegaly. Poor inspiration with right effusion and probable mild pulmonary vascular congestion.   PHYSICAL EXAM BP 124/61  Pulse 93  Temp 98.1 F (36.7 C) (Oral)  Resp 20  Ht 6\' 1"  (1.854 m)  Wt 277 lb 5.4 oz (125.8 kg)  BMI 36.59 kg/m2  SpO2 99% General: Well developed, well nourished, in no acute distress Head: Eyes PERRLA, No xanthomas.   Normal cephalic and atramatic  Lungs: Bibasilar rales, R>L, no wheezes or coughing. Remains on O2 via [place] Heart: HRRR S1 S2 2/6 holosystolic murmur. Heard best at the RSB. Pulses are 2+ & equal.            No carotid bruit. No JVD.  No abdominal bruits. No femoral bruits. Abdomen: Bowel sounds are positive, abdomen soft and non-tender without masses or                  Hernia's noted.  Msk:  Back normal, normal gait. Normal strength and tone for age. Extremities: No clubbing, cyanosis, 1+ pitting edema in the ankles bilaterally. Woody skin Thickening pretibial area. Wound on the left lower leg, with dressing that needs to be changed.  DP difficult to palpate,. Neuro: Alert and oriented X 3. Psych:  Good affect, responds appropriately  TELEMETRY: Reviewed telemetry pt in SR  ASSESSMENT AND PLAN: 1. Acute diastolic CHF : Can consider  right heart failure as well with elevated pulmonary pressures in the setting of COPD and OSA. He continues to diurese, wt not completed yet at the time of this assessment. Uncertain "dry wt", but clearly needs to diurese another 10 lbs before considering discharge. He remains on IV lasix 40 mg Q 8hrs, which is decreased from 80 mg IV yesterday.  Creatinine is stable at 2.35. Repeat limited study echo is planned for today.  2. Chronic Renal insufficiency.: Baseline Creatinine appears to be 2.23-2.25 on review of prior labs. He has a single left kidney, and therefore careful monitoring of his kidney status is being done.   3. Hypertension: Review of BP trend shows improvement since admission with diureses and continuation of clonidine and hydralazine. [name]  tachycardic today. Can consider low dose BB if continues after acute phase of illness is passed as he is not wheezing.    [name]. [name] NP [name] 10/29/2012, 8:36 AM   Attending note:  Patient seen and examined. Breathing more easily. Reviewed consultation note by Dr. Dietrich Pates. Further diuresis is planned. Output more than intake 2900 cc last 24 hours. Renal dysfunction is also stable. Followup limited echocardiogram was ordered by Dr. Dietrich Pates given change in ECG. Recent  study last month showed normal LVEF without focal wall motion abnormality. Only mild pulmonary hypertension at that time.  Jonelle Sidle, M.D., F.A.C.C.

## 2012-10-29 NOTE — Progress Notes (Signed)
*  PRELIMINARY RESULTS* Echocardiogram 2D Echocardiogram has been performed.  [name] [name] 10/29/2012, 12:15 PM

## 2012-10-29 NOTE — Progress Notes (Signed)
Subjective: He says he feels better. His breathing is good. The help from consultants is noted and appreciated  Objective: Vital signs in last 24 hours: Temp:  [98.1 F (36.7 C)-99.5 F (37.5 C)] 99.5 F (37.5 C) (11/22 1400) Pulse Rate:  [89-93] 89  (11/22 1400) Resp:  [18-20] 20  (11/22 1400) BP: (109-127)/(47-61) 109/47 mmHg (11/22 1400) SpO2:  [93 %-100 %] 94 % (11/22 1421) FiO2 (%):  [2 %] 2 % (11/21 2256) Weight:  [125.828 kg (277 lb 6.4 oz)-126.327 kg (278 lb 8 oz)] 126.327 kg (278 lb 8 oz) (11/22 1328) Weight change:  Last BM Date: 10/29/12  Intake/Output from previous day: 11/21 0701 - 11/22 0700 In: 890 [P.O.:890] Out: 3850 [Urine:3850]  PHYSICAL EXAM General appearance: alert, cooperative, no distress and morbidly obese Resp: clear to auscultation bilaterally Cardio: regular rate and rhythm, S1, S2 normal, no murmur, click, rub or gallop GI: soft, non-tender; bowel sounds normal; no masses,  no organomegaly Extremities: venous stasis dermatitis noted  Lab Results:    Basic Metabolic Panel:  Basename 10/29/12 0505 10/28/12 0432  NA 140 139  K 4.0 3.9  CL 103 105  CO2 28 25  GLUCOSE 94 100*  BUN 34* 35*  CREATININE 2.35* 2.39*  CALCIUM 8.6 8.1*  MG -- --  PHOS -- --   Liver Function Tests: No results found for this basename: AST:2,ALT:2,ALKPHOS:2,BILITOT:2,PROT:2,ALBUMIN:2 in the last 72 hours No results found for this basename: LIPASE:2,AMYLASE:2 in the last 72 hours No results found for this basename: AMMONIA:2 in the last 72 hours CBC:  Basename 10/27/12 0504  WBC 8.0  NEUTROABS --  HGB 13.3  HCT 41.5  MCV 91.6  PLT 177   Cardiac Enzymes: No results found for this basename: CKTOTAL:3,CKMB:3,CKMBINDEX:3,TROPONINI:3 in the last 72 hours BNP: No results found for this basename: PROBNP:3 in the last 72 hours D-Dimer: No results found for this basename: DDIMER:2 in the last 72 hours CBG: No results found for this basename: GLUCAP:6 in the  last 72 hours Hemoglobin A1C:  Basename 10/28/12 1720  HGBA1C 6.1*   Fasting Lipid Panel: No results found for this basename: CHOL,HDL,LDLCALC,TRIG,CHOLHDL,LDLDIRECT in the last 72 hours Thyroid Function Tests:  Basename 10/28/12 1720  TSH 0.427  T4TOTAL --  FREET4 0.77*  T3FREE --  THYROIDAB --   Anemia Panel: No results found for this basename: VITAMINB12,FOLATE,FERRITIN,TIBC,IRON,RETICCTPCT in the last 72 hours Coagulation: No results found for this basename: LABPROT:2,INR:2 in the last 72 hours Urine Drug Screen: Drugs of Abuse  No results found for this basename: labopia, cocainscrnur, labbenz, amphetmu, thcu, labbarb    Alcohol Level: No results found for this basename: ETH:2 in the last 72 hours Urinalysis: No results found for this basename: COLORURINE:2,APPERANCEUR:2,LABSPEC:2,PHURINE:2,GLUCOSEU:2,HGBUR:2,BILIRUBINUR:2,KETONESUR:2,PROTEINUR:2,UROBILINOGEN:2,NITRITE:2,LEUKOCYTESUR:2 in the last 72 hours Misc. Labs:  ABGS No results found for this basename: PHART,PCO2,PO2ART,TCO2,HCO3 in the last 72 hours CULTURES Recent Results (from the past 240 hour(s))  CLOSTRIDIUM DIFFICILE BY PCR     Status: Normal   Collection Time   10/27/12  9:14 AM      Component Value Range Status Comment   C difficile by pcr NEGATIVE  NEGATIVE Final    Studies/Results: No results found.  Medications:  Prior to Admission:  Prescriptions prior to admission  Medication Sig Dispense Refill  . albuterol (PROVENTIL HFA;VENTOLIN HFA) 108 (90 BASE) MCG/ACT inhaler Inhale 2 puffs into the lungs every 6 (six) hours as needed. Bronchitis      . budesonide-formoterol (SYMBICORT) 160-4.5 MCG/ACT inhaler Inhale 2 puffs into the  lungs 2 (two) times daily.      . calcitRIOL (ROCALTROL) 0.25 MCG capsule Take 1 capsule (0.25 mcg total) by mouth 3 (three) times a week.  30 capsule  3  . cloNIDine (CATAPRES) 0.1 MG tablet Take 0.1 mg by mouth 3 (three) times daily.      . collagenase (SANTYL)  ointment Apply topically daily.  15 g  3  . desmopressin (DDAVP) 0.2 MG tablet Take 0.2 mg by mouth 2 (two) times daily.      Marland Kitchen dexamethasone (DECADRON) 0.5 MG tablet Take 0.5 mg by mouth daily.      . hydrALAZINE (APRESOLINE) 25 MG tablet Take 25 mg by mouth 2 (two) times daily.      Marland Kitchen levothyroxine (SYNTHROID, LEVOTHROID) 150 MCG tablet Take 150 mcg by mouth daily.      . phenytoin (DILANTIN) 100 MG ER capsule Take 200-300 mg by mouth daily. Takes 200 mg on Monday and Thursdsay. On all other days of the week takes 300 mg.      . simvastatin (ZOCOR) 80 MG tablet Take 80 mg by mouth at bedtime.      Marland Kitchen tiotropium (SPIRIVA) 18 MCG inhalation capsule Place 18 mcg into inhaler and inhale daily.      Marland Kitchen torsemide (DEMADEX) 20 MG tablet Take 2 tablets (40 mg total) by mouth daily.  60 tablet  2   Scheduled:   . albuterol  2.5 mg Nebulization Q6H  . atorvastatin  40 mg Oral q1800  . budesonide-formoterol  2 puff Inhalation BID  . calcitRIOL  0.25 mcg Oral 3 times weekly  . cloNIDine  0.1 mg Oral TID  . collagenase   Topical Daily  . desmopressin  0.2 mg Oral BID  . dexamethasone  0.5 mg Oral Daily  . enoxaparin (LOVENOX) injection  60 mg Subcutaneous Q24H  . furosemide  40 mg Intravenous Q8H  . hydrALAZINE  25 mg Oral BID  . levothyroxine  150 mcg Oral Daily  . phenytoin  200 mg Oral Q Mon  . phenytoin  200 mg Oral Q Thu  . phenytoin  300 mg Oral Custom  . sodium chloride  3 mL Intravenous Q12H  . tiotropium  18 mcg Inhalation Daily  . [DISCONTINUED] furosemide  40 mg Intravenous Q12H  . [DISCONTINUED] furosemide  80 mg Intravenous Q8H   Continuous:  XWR:UEAVWU chloride, acetaminophen, acetaminophen, albuterol, alum & mag hydroxide-simeth, HYDROcodone-acetaminophen, loperamide, ondansetron (ZOFRAN) IV, ondansetron, sodium chloride, traZODone  Assesment:he is improving. [name] not at dry weight yet. [name] going to have echocardiogram. 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 He will have echocardiogram    LOS: 3 days   Eli Adami L 10/29/2012, 2:42 PM

## 2012-10-29 NOTE — Consult Note (Signed)
NAME:  [name] [name], [name] [name]                  ACCOUNT NO.:  [id number]  MEDICAL RECORD NO.:  [id number]  LOCATION:                                 FACILITY:  PHYSICIAN:  Purcell Nails, MD DATE OF BIRTH:  12-01-1946  DATE OF CONSULTATION:  10/28/2012 DATE OF DISCHARGE:                                CONSULTATION   REASON FOR CONSULT:  History of panhypopituitarism, medication adjustment.  HISTORY OF PRESENT ILLNESS:  This is a 66 year old gentleman with significant medical history as below.  [name] currently inhouse with a complaint of shortness of breath, diagnosed with CHF exacerbation on IV diuresis.  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  [name] familiar to me from outpatient visits for the same.  I saw him in March, 2013, at which time, he was supposed to be on DDAVP 0.2 mg b.i.d., dexamethasone 0.5 mg daily, levothyroxine 150 mcg, and daily and testosterone 200 mg IM every 2 weeks.  He has a well controlled clinical prediabetes with A1c of 5.7%.  He had recent reoperation of his pituitary macroadenoma at [facility], [place].  During this admission, he was complaining of shortness of breath and bilateral lower leg swelling as well as chest tightness.  Since admission he feels better and making urine profusely.  PAST MEDICAL HISTORY:  COPD, hypertension, panhypopituitarism, pituitary macroadenoma, sleep apnea.  PAST SURGICAL HISTORY:  Pituitary macroadenoma removal as well as nephrectomy.  FAMILY HISTORY:  Nonsignificant.  SOCIAL HISTORY:  Significant for cigarette smoking; however, no current tobacco use, drug use, or alcohol use.  ALLERGIES:  [name] not known to be allergic to medications.  HOME MEDICATIONS:  His home medications include albuterol,  Symbicort, calcitriol, clonidine, DDAVP 0.2 mg b.i.d., dexamethasone 0.5 mg daily, hydralazine 25 mg 2 times daily, levothyroxine 150 mcg daily, simvastatin 80 mg daily, phenytoin 100 mg daily, Spiriva 18 mcg daily, torsemide 20 mg daily.  REVIEW OF SYSTEMS:  Complaint of shortness of breath, cough, chest tightness, no bleeding, bilateral lower extremities edema.  The rest of the systems have been reviewed and negative.  PHYSICAL EXAMINATION:  GENERAL:  [name] sick looking, not in acute distress, being transition to regular floor from ICU. VITAL SIGNS:  Blood pressure is 144/73, pulse rate 97, temperature 97.8. HEENT:  Puffy face.  No JVD at this point. NECK:  Negative for goiter. CHEST:  Diffuse rales at bases. CARDIOVASCULAR:  Distant heart sounds.  No murmur. ABDOMEN:  Obese, nontender. EXTREMITIES:  Large with pitting edema below knees bilaterally. SKIN:  Dry and without rash.  LABORATORY DATA:  His most recent labs show sodium 139, potassium 3.9, chloride 105, bicarb 25, BUN 35, creatinine 2.39, calcium 8.1.  His TFTs and A1c are not repeated.  ASSESSMENT: 1. 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 2. Hypothyroidism. 3. Diabetes insipidus. 4. Adrenal insufficiency. 5. Hypogonadism. 6. Chronic obstructive pulmonary disease. 7. Congestive heart failure. 8. Hypertension. 9. Hyperlipidemia. 10.Deconditioning. 11.Polypharmacy.  DISCUSSION:  Due to his  transsphenoidal surgery 2 times for pituitary macroadenoma, he does have well-established multiple endocrine deficiencies due to lack of stimulatory hormones from the pituitary gland.  Hence he cannot avoid medications including thyroid hormone steroids as well as DDAVP.  However if testosterone replacement can be avoided at this point.  Reviewing his medications,  I agree with continued therapy with hormone replacements.  I will obtain TFTs, A1c as well as random cortisol  level.  Noticed that dexamethasone does not interfere with cortisol measurement assays.  Dexamethasone 0.5 mg daily is potent enough for glucocorticoid replacement.  He will not need any confirmatory tests for this hormone deficiencies, has well-established over the years.  I will re-evaluate the patient tomorrow with the above lab tests.  Dear Dr. Abbe Amsterdam, thank you for the opportunity to participate in the care of this pleasant patient.          ______________________________ Purcell Nails, MD     GN/MEDQ  D:  10/28/2012  T:  10/28/2012  Job:  [id number]

## 2012-10-30 LAB — BASIC METABOLIC PANEL
CO2: 28 mEq/L (ref 19–32)
Calcium: 8.9 mg/dL (ref 8.4–10.5)
GFR calc Af Amer: 34 mL/min — ABNORMAL LOW (ref >90)
Sodium: 135 mEq/L (ref 135–145)

## 2012-10-30 MED ORDER — ALBUTEROL SULFATE (5 MG/ML) 0.5% IN NEBU
2.5000 mg | INHALATION_SOLUTION | Freq: Three times a day (TID) | RESPIRATORY_TRACT | Status: DC
  Administered 2012-10-30 – 2012-10-31 (×4): 2.5 mg via RESPIRATORY_TRACT
  Filled 2012-10-30 (×4): qty 0.5

## 2012-10-30 NOTE — Progress Notes (Signed)
Subjective: He says he feels better. [name] still having some loose stools. His breathing is better and is not as swollen  Objective: Vital signs in last 24 hours: Temp:  [98.2 F (36.8 C)-99.5 F (37.5 C)] 98.3 F (36.8 C) (11/23 0643) Pulse Rate:  [89-93] 93  (11/23 0643) Resp:  [20] 20  (11/23 0643) BP: (109-140)/(47-66) 125/60 mmHg (11/23 0643) SpO2:  [92 %-98 %] 92 % (11/23 0807) Weight:  [122.018 kg (269 lb)-126.327 kg (278 lb 8 oz)] 122.018 kg (269 lb) (11/23 1610) Weight change:  Last BM Date: 10/29/12  Intake/Output from previous day: 11/22 0701 - 11/23 0700 In: 480 [P.O.:480] Out: 2500 [Urine:2500]  PHYSICAL EXAM General appearance: alert, cooperative, mild distress and morbidly obese Resp: clear to auscultation bilaterally Cardio: regular rate and rhythm, S1, S2 normal, no murmur, click, rub or gallop GI: soft, non-tender; bowel sounds normal; no masses,  no organomegaly Extremities: He still has edema. His leg wounds are nearly healed  Lab Results:    Basic Metabolic Panel:  Basename 10/30/12 0700 10/29/12 0505  NA 135 140  K 4.5 4.0  CL 99 103  CO2 28 28  GLUCOSE 93 94  BUN 30* 34*  CREATININE 2.19* 2.35*  CALCIUM 8.9 8.6  MG -- --  PHOS -- --   Liver Function Tests: No results found for this basename: AST:2,ALT:2,ALKPHOS:2,BILITOT:2,PROT:2,ALBUMIN:2 in the last 72 hours No results found for this basename: LIPASE:2,AMYLASE:2 in the last 72 hours No results found for this basename: AMMONIA:2 in the last 72 hours CBC: No results found for this basename: WBC:2,NEUTROABS:2,HGB:2,HCT:2,MCV:2,PLT:2 in the last 72 hours Cardiac Enzymes: No results found for this basename: CKTOTAL:3,CKMB:3,CKMBINDEX:3,TROPONINI:3 in the last 72 hours BNP: No results found for this basename: PROBNP:3 in the last 72 hours D-Dimer: No results found for this basename: DDIMER:2 in the last 72 hours CBG: No results found for this basename: GLUCAP:6 in the last 72  hours Hemoglobin A1C:  Basename 10/28/12 1720  HGBA1C 6.1*   Fasting Lipid Panel: No results found for this basename: CHOL,HDL,LDLCALC,TRIG,CHOLHDL,LDLDIRECT in the last 72 hours Thyroid Function Tests:  Basename 10/28/12 1720  TSH 0.427  T4TOTAL --  FREET4 0.77*  T3FREE --  THYROIDAB --   Anemia Panel: No results found for this basename: VITAMINB12,FOLATE,FERRITIN,TIBC,IRON,RETICCTPCT in the last 72 hours Coagulation: No results found for this basename: LABPROT:2,INR:2 in the last 72 hours Urine Drug Screen: Drugs of Abuse  No results found for this basename: labopia, cocainscrnur, labbenz, amphetmu, thcu, labbarb    Alcohol Level: No results found for this basename: ETH:2 in the last 72 hours Urinalysis: No results found for this basename: COLORURINE:2,APPERANCEUR:2,LABSPEC:2,PHURINE:2,GLUCOSEU:2,HGBUR:2,BILIRUBINUR:2,KETONESUR:2,PROTEINUR:2,UROBILINOGEN:2,NITRITE:2,LEUKOCYTESUR:2 in the last 72 hours Misc. Labs:  ABGS No results found for this basename: PHART,PCO2,PO2ART,TCO2,HCO3 in the last 72 hours CULTURES Recent Results (from the past 240 hour(s))  CLOSTRIDIUM DIFFICILE BY PCR     Status: Normal   Collection Time   10/27/12  9:14 AM      Component Value Range Status Comment   C difficile by pcr NEGATIVE  NEGATIVE Final    Studies/Results: No results found.  Medications:  Prior to Admission:  Prescriptions prior to admission  Medication Sig Dispense Refill  . albuterol (PROVENTIL HFA;VENTOLIN HFA) 108 (90 BASE) MCG/ACT inhaler Inhale 2 puffs into the lungs every 6 (six) hours as needed. Bronchitis      . budesonide-formoterol (SYMBICORT) 160-4.5 MCG/ACT inhaler Inhale 2 puffs into the lungs 2 (two) times daily.      . calcitRIOL (ROCALTROL) 0.25 MCG  capsule Take 1 capsule (0.25 mcg total) by mouth 3 (three) times a week.  30 capsule  3  . cloNIDine (CATAPRES) 0.1 MG tablet Take 0.1 mg by mouth 3 (three) times daily.      . collagenase (SANTYL) ointment Apply  topically daily.  15 g  3  . desmopressin (DDAVP) 0.2 MG tablet Take 0.2 mg by mouth 2 (two) times daily.      Marland Kitchen dexamethasone (DECADRON) 0.5 MG tablet Take 0.5 mg by mouth daily.      . hydrALAZINE (APRESOLINE) 25 MG tablet Take 25 mg by mouth 2 (two) times daily.      Marland Kitchen levothyroxine (SYNTHROID, LEVOTHROID) 150 MCG tablet Take 150 mcg by mouth daily.      . phenytoin (DILANTIN) 100 MG ER capsule Take 200-300 mg by mouth daily. Takes 200 mg on Monday and Thursdsay. On all other days of the week takes 300 mg.      . simvastatin (ZOCOR) 80 MG tablet Take 80 mg by mouth at bedtime.      Marland Kitchen tiotropium (SPIRIVA) 18 MCG inhalation capsule Place 18 mcg into inhaler and inhale daily.      Marland Kitchen torsemide (DEMADEX) 20 MG tablet Take 2 tablets (40 mg total) by mouth daily.  60 tablet  2   Scheduled:   . albuterol  2.5 mg Nebulization TID  . atorvastatin  40 mg Oral q1800  . budesonide-formoterol  2 puff Inhalation BID  . calcitRIOL  0.25 mcg Oral 3 times weekly  . cloNIDine  0.1 mg Oral TID  . collagenase   Topical Daily  . desmopressin  0.2 mg Oral BID  . dexamethasone  0.5 mg Oral Daily  . enoxaparin (LOVENOX) injection  60 mg Subcutaneous Q24H  . furosemide  40 mg Intravenous Q8H  . hydrALAZINE  25 mg Oral BID  . levothyroxine  150 mcg Oral Daily  . phenytoin  200 mg Oral Q Mon  . phenytoin  200 mg Oral Q Thu  . phenytoin  300 mg Oral Custom  . sodium chloride  3 mL Intravenous Q12H  . tiotropium  18 mcg Inhalation Daily  . [DISCONTINUED] albuterol  2.5 mg Nebulization Q6H   Continuous:  WUJ:WJXBJY chloride, acetaminophen, acetaminophen, albuterol, alum & mag hydroxide-simeth, HYDROcodone-acetaminophen, loperamide, ondansetron (ZOFRAN) IV, ondansetron, sodium chloride, traZODone  Assesment: He was admitted with acute diastolic congestive heart failure. He has multiple other medical problems. [name] improving. His weight is down but he is not down 10 pounds yet. 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 He may be able to be discharged tomorrow    LOS: 4 days   Makailah Slavick L 10/30/2012, 9:50 AM

## 2012-10-31 LAB — BASIC METABOLIC PANEL
GFR calc Af Amer: 34 mL/min — ABNORMAL LOW (ref >90)
GFR calc non Af Amer: 29 mL/min — ABNORMAL LOW (ref >90)
Glucose, Bld: 90 mg/dL (ref 70–99)
Potassium: 4.2 mEq/L (ref 3.5–5.1)
Sodium: 139 mEq/L (ref 135–145)

## 2012-10-31 MED ORDER — TORSEMIDE 100 MG PO TABS: ORAL_TABLET | ORAL | Status: DC

## 2012-10-31 MED ORDER — ALBUTEROL SULFATE (2.5 MG/3ML) 0.083% IN NEBU: 2.5000 mg | INHALATION_SOLUTION | Freq: Four times a day (QID) | RESPIRATORY_TRACT | Status: DC | PRN

## 2012-10-31 NOTE — Plan of Care (Signed)
Problem: Discharge Progression Outcomes Goal: Independent ADLs or Home Health Care Outcome: Completed/Met Date Met:  10/31/12 10/31/12 1111 Patient discharged home with home health RN and PT. Notified [facility] of discharge home health to be resumed, faxed order and d/c summary as requested. Also notified of order for rolling walker, faxed order. Stated home health will resume, they will call patient prior to coming to see him. Discussed with patient and daughter, stated okay.

## 2012-10-31 NOTE — Progress Notes (Signed)
Subjective: [name] awake and alert and says he wants to go home. His weight originally was noted as being up: I had him re\re weight he is actually down by about 4 kg. He still has some edema of his legs and he still has some shortness of breath  Objective: Vital signs in last 24 hours: Temp:  [98 F (36.7 C)-98.2 F (36.8 C)] 98 F (36.7 C) (11/24 0622) Pulse Rate:  [84-90] 90  (11/24 0622) Resp:  [18-20] 20  (11/24 0622) BP: (115-128)/(53-62) 115/58 mmHg (11/24 0622) SpO2:  [96 %-100 %] 97 % (11/24 0822) Weight:  [118.842 kg (262 lb)-124.3 kg (274 lb 0.5 oz)] 118.842 kg (262 lb) (11/24 0924) Weight change: -1.528 kg (-3 lb 5.9 oz) Last BM Date: 10/29/12  Intake/Output from previous day: 11/23 0701 - 11/24 0700 In: 720 [P.O.:720] Out: 2550 [Urine:2550]  PHYSICAL EXAM General appearance: alert and no distress Resp: clear to auscultation bilaterally Cardio: regular rate and rhythm, S1, S2 normal, no murmur, click, rub or gallop GI: soft, non-tender; bowel sounds normal; no masses,  no organomegaly Extremities: Chronic venous stasis but his leg wounds are healing  Lab Results:    Basic Metabolic Panel:  Basename 10/31/12 0608 10/30/12 0700  NA 139 135  K 4.2 4.5  CL 101 99  CO2 29 28  GLUCOSE 90 93  BUN 30* 30*  CREATININE 2.20* 2.19*  CALCIUM 8.9 8.9  MG -- --  PHOS -- --   Liver Function Tests: No results found for this basename: AST:2,ALT:2,ALKPHOS:2,BILITOT:2,PROT:2,ALBUMIN:2 in the last 72 hours No results found for this basename: LIPASE:2,AMYLASE:2 in the last 72 hours No results found for this basename: AMMONIA:2 in the last 72 hours CBC: No results found for this basename: WBC:2,NEUTROABS:2,HGB:2,HCT:2,MCV:2,PLT:2 in the last 72 hours Cardiac Enzymes: No results found for this basename: CKTOTAL:3,CKMB:3,CKMBINDEX:3,TROPONINI:3 in the last 72 hours BNP: No results found for this basename: PROBNP:3 in the last 72 hours D-Dimer: No results found for this  basename: DDIMER:2 in the last 72 hours CBG: No results found for this basename: GLUCAP:6 in the last 72 hours Hemoglobin A1C:  Basename 10/28/12 1720  HGBA1C 6.1*   Fasting Lipid Panel: No results found for this basename: CHOL,HDL,LDLCALC,TRIG,CHOLHDL,LDLDIRECT in the last 72 hours Thyroid Function Tests:  Basename 10/28/12 1720  TSH 0.427  T4TOTAL --  FREET4 0.77*  T3FREE --  THYROIDAB --   Anemia Panel: No results found for this basename: VITAMINB12,FOLATE,FERRITIN,TIBC,IRON,RETICCTPCT in the last 72 hours Coagulation: No results found for this basename: LABPROT:2,INR:2 in the last 72 hours Urine Drug Screen: Drugs of Abuse  No results found for this basename: labopia, cocainscrnur, labbenz, amphetmu, thcu, labbarb    Alcohol Level: No results found for this basename: ETH:2 in the last 72 hours Urinalysis: No results found for this basename: COLORURINE:2,APPERANCEUR:2,LABSPEC:2,PHURINE:2,GLUCOSEU:2,HGBUR:2,BILIRUBINUR:2,KETONESUR:2,PROTEINUR:2,UROBILINOGEN:2,NITRITE:2,LEUKOCYTESUR:2 in the last 72 hours Misc. Labs:  ABGS No results found for this basename: PHART,PCO2,PO2ART,TCO2,HCO3 in the last 72 hours CULTURES Recent Results (from the past 240 hour(s))  CLOSTRIDIUM DIFFICILE BY PCR     Status: Normal   Collection Time   10/27/12  9:14 AM      Component Value Range Status Comment   C difficile by pcr NEGATIVE  NEGATIVE Final    Studies/Results: No results found.  Medications:  Prior to Admission:  Prescriptions prior to admission  Medication Sig Dispense Refill  . albuterol (PROVENTIL HFA;VENTOLIN HFA) 108 (90 BASE) MCG/ACT inhaler Inhale 2 puffs into the lungs every 6 (six) hours as needed. Bronchitis      .  budesonide-formoterol (SYMBICORT) 160-4.5 MCG/ACT inhaler Inhale 2 puffs into the lungs 2 (two) times daily.      . calcitRIOL (ROCALTROL) 0.25 MCG capsule Take 1 capsule (0.25 mcg total) by mouth 3 (three) times a week.  30 capsule  3  . cloNIDine  (CATAPRES) 0.1 MG tablet Take 0.1 mg by mouth 3 (three) times daily.      . collagenase (SANTYL) ointment Apply topically daily.  15 g  3  . desmopressin (DDAVP) 0.2 MG tablet Take 0.2 mg by mouth 2 (two) times daily.      Marland Kitchen dexamethasone (DECADRON) 0.5 MG tablet Take 0.5 mg by mouth daily.      . hydrALAZINE (APRESOLINE) 25 MG tablet Take 25 mg by mouth 2 (two) times daily.      Marland Kitchen levothyroxine (SYNTHROID, LEVOTHROID) 150 MCG tablet Take 150 mcg by mouth daily.      . phenytoin (DILANTIN) 100 MG ER capsule Take 200-300 mg by mouth daily. Takes 200 mg on Monday and Thursdsay. On all other days of the week takes 300 mg.      . simvastatin (ZOCOR) 80 MG tablet Take 80 mg by mouth at bedtime.      Marland Kitchen tiotropium (SPIRIVA) 18 MCG inhalation capsule Place 18 mcg into inhaler and inhale daily.      . [DISCONTINUED] torsemide (DEMADEX) 20 MG tablet Take 2 tablets (40 mg total) by mouth daily.  60 tablet  2   Scheduled:   . albuterol  2.5 mg Nebulization TID  . atorvastatin  40 mg Oral q1800  . budesonide-formoterol  2 puff Inhalation BID  . calcitRIOL  0.25 mcg Oral 3 times weekly  . cloNIDine  0.1 mg Oral TID  . collagenase   Topical Daily  . desmopressin  0.2 mg Oral BID  . dexamethasone  0.5 mg Oral Daily  . enoxaparin (LOVENOX) injection  60 mg Subcutaneous Q24H  . furosemide  40 mg Intravenous Q8H  . hydrALAZINE  25 mg Oral BID  . levothyroxine  150 mcg Oral Daily  . phenytoin  200 mg Oral Q Mon  . phenytoin  200 mg Oral Q Thu  . phenytoin  300 mg Oral Custom  . sodium chloride  3 mL Intravenous Q12H  . tiotropium  18 mcg Inhalation Daily   Continuous:  RUE:AVWUJW chloride, acetaminophen, acetaminophen, albuterol, alum & mag hydroxide-simeth, HYDROcodone-acetaminophen, loperamide, ondansetron (ZOFRAN) IV, ondansetron, sodium chloride, traZODone  Assesment: He was admitted with acute right heart failure. [name] improved. [name] down the 10 pound recommendation from cardiology. He feels  better. 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 He will have intensive home health treatments. [name] high risk for readmission because I don't think he really understands his problem but with a CHF protocol at home he may be okay    LOS: 5 days   Clarinda Obi L 10/31/2012, 9:39 AM

## 2012-10-31 NOTE — Progress Notes (Signed)
10/31/12 1113 Patient discharged home this morning, reviewed discharge instructions with patient, daughter at bedside. Given copy of instructions, medication list, and f/u appointment information. Discussed importance of daily weights, monitoring fluid status, low sodium diet, given heart failure zones education sheet. Verbalized understanding of instructions. Pt has been given scale for daily weights. Preferred that dressing to left lower leg not be changed prior to discharge, dressing dry and intact. No c/o pain or shortness of breath at time of discharge. IV site d/c'd per NT, site within normal limits. Pt left floor in stable condition via w/c accompanied by nurse tech. Earnstine Regal, RN

## 2012-10-31 NOTE — Discharge Summary (Signed)
Physician Discharge Summary  Patient ID: [name] [name] MRN: [id number] DOB/AGE: [birth year] 66 y.o. 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 Bronchitis      albuterol (2.5 MG/3ML) 0.083% nebulizer solution   Commonly known as: PROVENTIL   Take 3 mLs (2.5 mg total) by nebulization every 6 (six) hours as needed for wheezing.      budesonide-formoterol 160-4.5 MCG/ACT inhaler   Commonly known as: SYMBICORT   Inhale 2 puffs into the lungs 2 (two) times daily.      calcitRIOL 0.25 MCG capsule   Commonly known as: ROCALTROL   Take 1 capsule (0.25 mcg total) by mouth 3 (three) times a week.      cloNIDine 0.1 MG tablet   Commonly known as: CATAPRES   Take 0.1 mg by mouth 3 (three) times daily.      collagenase ointment   Commonly known as: SANTYL   Apply topically daily.      desmopressin 0.2 MG tablet   Commonly known as: DDAVP   Take 0.2 mg by mouth 2 (two) times daily.      dexamethasone 0.5 MG tablet   Commonly known as: DECADRON   Take 0.5 mg by mouth daily.      hydrALAZINE 25 MG tablet   Commonly known as: APRESOLINE   Take 25 mg by mouth 2 (two) times daily.      levothyroxine 150 MCG tablet   Commonly known as: SYNTHROID, LEVOTHROID   Take 150 mcg by mouth daily.      phenytoin 100 MG ER capsule   Commonly known as: DILANTIN   Take 200-300 mg by mouth daily. Takes 200 mg on  Monday and Thursdsay. On all other days of the week takes 300 mg.      simvastatin 80 MG tablet   Commonly known as: ZOCOR   Take 80 mg by mouth at bedtime.      tiotropium 18 MCG inhalation capsule   Commonly known as: SPIRIVA   Place 18 mcg into inhaler and inhale daily.      torsemide 100 MG tablet   Commonly known as: DEMADEX   0.5  Tablet daily. Weigh yourself daily and if your weight goes up three pounds take another 0.5 tab        Discharged Condition: Improved    Consults: Cardiology/endocrinology  Significant Diagnostic Studies: Dg Chest Port 1 View  10/26/2012  *RADIOLOGY REPORT*  Clinical Data: Shortness of breath, peripheral edema  PORTABLE CHEST - 1 VIEW  Comparison: Portable chest x-ray of 09/26/2012  Findings: The lungs are not as well aerated.  There is moderate cardiomegaly present.  There is a right pleural effusion noted and perhaps mild pulmonary vascular congestion.  IMPRESSION: Moderate cardiomegaly.  Poor inspiration with right effusion and probable mild pulmonary vascular congestion.   Original Report Authenticated By: Dwyane Dee,  M.D.     Lab Results: Basic Metabolic Panel:  Basename 10/31/12 0608 10/30/12 0700  NA 139 135  K 4.2 4.5  CL 101 99  CO2 29 28  GLUCOSE 90 93  BUN 30* 30*  CREATININE 2.20* 2.19*  CALCIUM 8.9 8.9  MG -- --  PHOS -- --   Liver Function Tests: No results found for this basename: AST:2,ALT:2,ALKPHOS:2,BILITOT:2,PROT:2,ALBUMIN:2 in the last 72 hours   CBC: No results found for this basename: WBC:2,NEUTROABS:2,HGB:2,HCT:2,MCV:2,PLT:2 in the last 72 hours  Recent Results (from the past 240 hour(s))  CLOSTRIDIUM DIFFICILE BY PCR     Status: Normal   Collection Time   10/27/12  9:14 AM      Component Value Range Status Comment   C difficile by pcr NEGATIVE  NEGATIVE Final      Hospital Course: He was admitted with shortness of breath. He appeared to have acute congestive heart failure. He has multiple other medical  problems. He was initially admitted to step down unit and was diuresed with intravenous diuretics. He has diastolic heart failure. 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 He had cardiology consultation. By the time of discharge he was down approximately 15-18 pounds. There is some variation in the scales. His edema is much improved. [name] breathing better. His renal function has remained stable but he is not a candidate for an ACE inhibitor or ARB because of his renal dysfunction  Discharge Exam: Blood pressure 115/58, pulse 90, temperature 98 F (36.7 C), temperature source Oral, resp. rate 20, height 6\' 1"  (1.854 m), weight 118.842 kg (262 lb), SpO2 97.00%. [name] awake and alert. His chest is relatively clear. His heart is regular. His abdomen is soft without masses. He has much less abdominal edema than before. He has 1+ edema and chronic venous stasis of his legs but that is much improved  Disposition: Home with intensive home health services. I changed his torsemide from 20 mg daily to 50 mg daily and told him to take an extra 50 mg if he gains 3 pounds. [name] to weigh himself daily.      Discharge Orders    Future Orders Please Complete By Expires   Walker rolling      Home Health      Scheduling Instructions:   He should be on CHF protocol   Questions: Responses:   To provide the following care/treatments PT    RN   Face-to-face encounter      Comments:   I Myiesha Edgar L certify that this patient is under my care and that I, or a nurse practitioner or physician's assistant working with me, had a face-to-face encounter that meets the physician face-to-face encounter requirements with this patient on 10/31/2012. The encounter with the patient was in whole, or in part for the following medical condition(s) which is the primary reason for home health care (List medical condition): CHF    Questions: Responses:   The encounter with the patient was in whole, or in part, for the following medical condition, which is the primary reason for home health care chf   I certify that, based on my findings, the following services are medically necessary home health services Nursing    Physical therapy   My clinical findings support the need for the above services Shortness of breath with activity   Further, I certify that my clinical  findings support that this patient is homebound due to: Ambulates short distances less than 300 feet   To provide the following care/treatments PT    RN   Discharge patient           Signed: Fredirick Maudlin Pager [phone]  10/31/2012, 9:41 AM

## 2012-11-18 ENCOUNTER — Ambulatory Visit (INDEPENDENT_AMBULATORY_CARE_PROVIDER_SITE_OTHER): Payer: Medicare Other | Admitting: Cardiology

## 2012-11-18 ENCOUNTER — Encounter: Payer: Self-pay | Admitting: Cardiology

## 2012-11-18 VITALS — BP 122/82 | HR 94 | Ht 73.0 in | Wt 273.0 lb

## 2012-11-18 DIAGNOSIS — E230 Hypopituitarism: Secondary | ICD-10-CM

## 2012-11-18 DIAGNOSIS — N189 Chronic kidney disease, unspecified: Secondary | ICD-10-CM

## 2012-11-18 DIAGNOSIS — I10 Essential (primary) hypertension: Secondary | ICD-10-CM

## 2012-11-18 DIAGNOSIS — G4730 Sleep apnea, unspecified: Secondary | ICD-10-CM

## 2012-11-18 DIAGNOSIS — I509 Heart failure, unspecified: Secondary | ICD-10-CM

## 2012-11-18 DIAGNOSIS — E785 Hyperlipidemia, unspecified: Secondary | ICD-10-CM | POA: Insufficient documentation

## 2012-11-18 DIAGNOSIS — E039 Hypothyroidism, unspecified: Secondary | ICD-10-CM | POA: Insufficient documentation

## 2012-11-18 DIAGNOSIS — Z6841 Body Mass Index (BMI) 40.0 and over, adult: Secondary | ICD-10-CM

## 2012-11-18 DIAGNOSIS — R0602 Shortness of breath: Secondary | ICD-10-CM

## 2012-11-18 DIAGNOSIS — F17201 Nicotine dependence, unspecified, in remission: Secondary | ICD-10-CM | POA: Insufficient documentation

## 2012-11-18 MED ORDER — FUROSEMIDE 40 MG PO TABS: 60.0000 mg | ORAL_TABLET | Freq: Two times a day (BID) | ORAL | Status: DC

## 2012-11-18 NOTE — Assessment & Plan Note (Signed)
Consultation with an endocrinologist, Dr. Fransico Him, is scheduled within the next few weeks.  [name] taking no mineralocorticoid, but does not appear to have an electrolyte disturbance nor fluid/sodium loss as a result.

## 2012-11-18 NOTE — Assessment & Plan Note (Signed)
Sleep apnea is likely persistent and contributing to patient's multiple medical problems.  A return visit to a sleep specialist will be desirable but will be deferred for the present.

## 2012-11-18 NOTE — Patient Instructions (Addendum)
Your physician recommends that you schedule a follow-up appointment in: 3 weeks  Your physician has recommended you make the following change in your medication:  1 - STOP Hydralazine 2 - INCREASE Lasix to 60 mg twice a day  Your physician recommends that you return for lab work in: Today

## 2012-11-18 NOTE — Assessment & Plan Note (Signed)
Recent lipid profile is being sought.  If not available, one will be drawn.

## 2012-11-18 NOTE — Assessment & Plan Note (Addendum)
No further diuresis since hospital discharge; in fact, he may have gained some weight.  Dose of furosemide will be increased to 60 mg twice a day pending laboratory monitoring studies.
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
## 2012-11-18 NOTE — Assessment & Plan Note (Signed)
No blood pressure elevations for at least the past few months.  Current antihypertensive medications will be continued except hydralazine, which is likely contributing little if anything.

## 2012-11-18 NOTE — Progress Notes (Deleted)
Name: [name] [name]    DOB: 1946-05-24  Age: 66 y.o.  MR#: [id number]       PCP:  Avon Gully, MD      Insurance: @PAYORNAME @   CC:   No chief complaint on file.  ******MEDICATION BOTTLES REVIEWED - WAS ON BENICAR, AT SOME POINT, HOWEVER BOTTLE STATES 2010 AND D/C SUMMARY DOES NOT LIST THIS MEDICATION. [facility] 10/2012 ECHO 10/29/12 RENAL US 09/09/12  VS BP 122/82  Pulse 94  Ht 6\' 1"  (1.854 m)  Wt 233 lb (105.688 kg)  BMI 30.74 kg/m2  SpO2 97%  Weights Current Weight  11/18/12 233 lb (105.688 kg)  10/31/12 262 lb (118.842 kg)  09/29/12 278 lb (126.1 kg)    Blood Pressure  BP Readings from Last 3 Encounters:  11/18/12 122/82  10/31/12 115/58  10/01/12 159/78     Admit date:  (Not on file) Last encounter with RMR:  Visit date not found   Allergy No Known Allergies  Current Outpatient Prescriptions  Medication Sig Dispense Refill  . albuterol (PROVENTIL HFA;VENTOLIN HFA) 108 (90 BASE) MCG/ACT inhaler Inhale 2 puffs into the lungs every 6 (six) hours as needed. Bronchitis      . albuterol (PROVENTIL) (2.5 MG/3ML) 0.083% nebulizer solution Take 3 mLs (2.5 mg total) by nebulization every 6 (six) hours as needed for wheezing.  75 mL  12  . budesonide-formoterol (SYMBICORT) 160-4.5 MCG/ACT inhaler Inhale 2 puffs into the lungs 2 (two) times daily.      . calcitRIOL (ROCALTROL) 0.25 MCG capsule Take 1 capsule (0.25 mcg total) by mouth 3 (three) times a week.  30 capsule  3  . cloNIDine (CATAPRES) 0.1 MG tablet Take 0.1 mg by mouth 3 (three) times daily.      . collagenase (SANTYL) ointment Apply topically daily.  15 g  3  . desmopressin (DDAVP) 0.2 MG tablet Take 0.2 mg by mouth 2 (two) times daily.      Marland Kitchen dexamethasone (DECADRON) 0.5 MG tablet Take 0.5 mg by mouth daily.      . furosemide (LASIX) 40 MG tablet Take 40 mg by mouth 2 (two) times daily.      . hydrALAZINE (APRESOLINE) 25 MG tablet Take 25 mg by mouth 2 (two) times daily.      Marland Kitchen levothyroxine (SYNTHROID,  LEVOTHROID) 150 MCG tablet Take 150 mcg by mouth daily.      . phenytoin (DILANTIN) 100 MG ER capsule Take 200-300 mg by mouth daily. Takes 200 mg on Monday and Thursdsay. On all other days of the week takes 300 mg.      . simvastatin (ZOCOR) 80 MG tablet Take 80 mg by mouth at bedtime.      Marland Kitchen tiotropium (SPIRIVA) 18 MCG inhalation capsule Place 18 mcg into inhaler and inhale daily.      Marland Kitchen torsemide (DEMADEX) 100 MG tablet 0.5  Tablet daily. Weigh yourself daily and if your weight goes up three pounds take another 0.5 tab  30 tablet  12    Discontinued Meds:   There are no discontinued medications.  Patient Active Problem List  Diagnosis  . Morbid obesity with BMI of 50.0-59.9, adult  . 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 Hypertension  . Chronic kidney disease  . Sleep apnea  . Hypothyroid  . Tobacco abuse, in remission  . Hyperlipidemia  . Panhypopituitarism    LABS Admission on 10/26/2012, Discharged on 10/31/2012  Component Date Value  . Color, Urine 10/26/2012 YELLOW   .  APPearance 10/26/2012 CLEAR   . Specific Gravity, Urine 10/26/2012 1.025   . pH 10/26/2012 6.0   . Glucose, UA 10/26/2012 NEGATIVE   . Hgb urine dipstick 10/26/2012 TRACE*  . Bilirubin Urine 10/26/2012 NEGATIVE   . Ketones, ur 10/26/2012 NEGATIVE   . Protein, ur 10/26/2012 100*  . Urobilinogen, UA 10/26/2012 0.2   . Nitrite 10/26/2012 NEGATIVE   . Leukocytes, UA 10/26/2012 NEGATIVE   . WBC 10/26/2012 8.4   . RBC 10/26/2012 4.49   . Hemoglobin 10/26/2012 13.3   . HCT 10/26/2012 41.2   . MCV 10/26/2012 91.8   . [facility] 10/26/2012 29.6   . MCHC 10/26/2012 32.3   . RDW 10/26/2012 17.3*  . Platelets 10/26/2012 192   . Neutrophils Relative 10/26/2012 62   . Neutro Abs 10/26/2012 5.2   . Lymphocytes Relative 10/26/2012 18   . Lymphs Abs 10/26/2012 1.5   . Monocytes Relative 10/26/2012 12   . Monocytes Absolute 10/26/2012 1.0   . Eosinophils Relative 10/26/2012 7*  . Eosinophils Absolute  10/26/2012 0.6   . Basophils Relative 10/26/2012 1   . Basophils Absolute 10/26/2012 0.1   . Sodium 10/26/2012 138   . Potassium 10/26/2012 4.9   . Chloride 10/26/2012 105   . CO2 10/26/2012 25   . Glucose, Bld 10/26/2012 99   . BUN 10/26/2012 32*  . Creatinine, Ser 10/26/2012 2.35*  . Calcium 10/26/2012 8.9   . GFR calc non Af Amer 10/26/2012 27*  . GFR calc Af Amer 10/26/2012 31*  . RBC / HPF 10/26/2012 TOO NUMEROUS TO COUNT   . Troponin I 10/26/2012 <0.30   . Pro B Natriuretic peptid* 10/26/2012 9706.0*  . Sodium 10/27/2012 140   . Potassium 10/27/2012 5.2*  . Chloride 10/27/2012 105   . CO2 10/27/2012 25   . Glucose, Bld 10/27/2012 87   . BUN 10/27/2012 33*  . Creatinine, Ser 10/27/2012 2.50*  . Calcium 10/27/2012 8.9   . GFR calc non Af Amer 10/27/2012 25*  . GFR calc Af Amer 10/27/2012 29*  . WBC 10/27/2012 8.0   . RBC 10/27/2012 4.53   . Hemoglobin 10/27/2012 13.3   . HCT 10/27/2012 41.5   . MCV 10/27/2012 91.6   . [facility] 10/27/2012 29.4   . MCHC 10/27/2012 32.0   . RDW 10/27/2012 17.2*  . Platelets 10/27/2012 177   . C difficile by pcr 10/27/2012 NEGATIVE   . Sodium 10/28/2012 139   . Potassium 10/28/2012 3.9   . Chloride 10/28/2012 105   . CO2 10/28/2012 25   . Glucose, Bld 10/28/2012 100*  . BUN 10/28/2012 35*  . Creatinine, Ser 10/28/2012 2.39*  . Calcium 10/28/2012 8.1*  . GFR calc non Af Amer 10/28/2012 26*  . GFR calc Af Amer 10/28/2012 31*  . Free T4 10/28/2012 0.77*  . TSH 10/28/2012 0.427   . Hemoglobin A1C 10/28/2012 6.1*  . Mean Plasma Glucose 10/28/2012 128*  . Sodium 10/29/2012 140   . Potassium 10/29/2012 4.0   . Chloride 10/29/2012 103   . CO2 10/29/2012 28   . Glucose, Bld 10/29/2012 94   . BUN 10/29/2012 34*  . Creatinine, Ser 10/29/2012 2.35*  . Calcium 10/29/2012 8.6   . GFR calc non Af Amer 10/29/2012 27*  . GFR calc Af Amer 10/29/2012 31*  . Sodium 10/30/2012 135   . Potassium 10/30/2012 4.5   . Chloride 10/30/2012 99   . CO2  10/30/2012 28   . Glucose, Bld 10/30/2012 93   . BUN  10/30/2012 30*  . Creatinine, Ser 10/30/2012 2.19*  . Calcium 10/30/2012 8.9   . GFR calc non Af Amer 10/30/2012 29*  . GFR calc Af Amer 10/30/2012 34*  . Sodium 10/31/2012 139   . Potassium 10/31/2012 4.2   . Chloride 10/31/2012 101   . CO2 10/31/2012 29   . Glucose, Bld 10/31/2012 90   . BUN 10/31/2012 30*  . Creatinine, Ser 10/31/2012 2.20*  . Calcium 10/31/2012 8.9   . GFR calc non Af Amer 10/31/2012 29*  . GFR calc Af Amer 10/31/2012 34*  Admission on 09/26/2012, Discharged on 10/01/2012  Component Date Value  . Pro B Natriuretic peptid* 09/26/2012 2784.0*  . Troponin I 09/26/2012 <0.30   . Color, Urine 09/27/2012 YELLOW   . APPearance 09/27/2012 CLEAR   . Specific Gravity, Urine 09/27/2012 1.015   . pH 09/27/2012 6.0   . Glucose, UA 09/27/2012 NEGATIVE   . Hgb urine dipstick 09/27/2012 NEGATIVE   . Bilirubin Urine 09/27/2012 NEGATIVE   . Ketones, ur 09/27/2012 NEGATIVE   . Protein, ur 09/27/2012 30*  . Urobilinogen, UA 09/27/2012 0.2   . Nitrite 09/27/2012 NEGATIVE   . Leukocytes, UA 09/27/2012 NEGATIVE   . Specimen Description 09/27/2012 URINE, CLEAN CATCH   . Special Requests 09/27/2012 NONE   . Culture  Setup Time 09/27/2012 09/28/2012 01:21   . Colony Count 09/27/2012 >=100,000 COLONIES/ML   . Culture 09/27/2012 PROTEUS MIRABILIS   . Report Status 09/27/2012 09/30/2012 FINAL   . Organism ID, Bacteria 09/27/2012 PROTEUS MIRABILIS   . Sodium 09/26/2012 140   . Potassium 09/26/2012 4.2   . Chloride 09/26/2012 102   . CO2 09/26/2012 28   . Glucose, Bld 09/26/2012 110*  . BUN 09/26/2012 26*  . Creatinine, Ser 09/26/2012 2.22*  . Calcium 09/26/2012 8.9   . GFR calc non Af Amer 09/26/2012 29*  . GFR calc Af Amer 09/26/2012 34*  . WBC 09/26/2012 7.7   . RBC 09/26/2012 4.73   . Hemoglobin 09/26/2012 14.0   . HCT 09/26/2012 44.1   . MCV 09/26/2012 93.2   . MCH 09/26/2012 29.6   . MCHC 09/26/2012 31.7   . RDW  09/26/2012 15.0   . Platelets 09/26/2012 225   . Neutrophils Relative 09/26/2012 64   . Neutro Abs 09/26/2012 4.9   . Lymphocytes Relative 09/26/2012 13   . Lymphs Abs 09/26/2012 1.0   . Monocytes Relative 09/26/2012 11   . Monocytes Absolute 09/26/2012 0.8   . Eosinophils Relative 09/26/2012 12*  . Eosinophils Absolute 09/26/2012 0.9*  . Basophils Relative 09/26/2012 1   . Basophils Absolute 09/26/2012 0.1   . FIO2 09/26/2012 40.00   . Delivery systems 09/26/2012 BILEVEL POSITIVE AIRWAY PRESSURE   . Mode 09/26/2012 BILEVEL POSITIVE AIRWAY PRESSURE   . Inspiratory PAP 09/26/2012 16   . Expiratory PAP 09/26/2012 8   . pH, Arterial 09/26/2012 7.357   . pCO2 arterial 09/26/2012 48.1*  . pO2, Arterial 09/26/2012 112.0*  . Bicarbonate 09/26/2012 26.3*  . TCO2 09/26/2012 22.8   . Acid-Base Excess 09/26/2012 1.4   . O2 Saturation 09/26/2012 97.7   . Collection site 09/26/2012 RIGHT RADIAL   . Drawn by 09/26/2012 COLLECTED BY RT   . Sample type 09/26/2012 ARTERIAL   . Allens test (pass/fail) 09/26/2012 PASS   . MRSA by PCR 09/26/2012 POSITIVE*  . Vancomycin Tr 09/27/2012 13.4   . Squamous Epithelial / LPF 09/27/2012 RARE   . WBC, UA 09/27/2012 0-2   . RBC / HPF 09/27/2012  0-2   . Bacteria, UA 09/27/2012 RARE   . Sodium 09/29/2012 136   . Potassium 09/29/2012 4.5   . Chloride 09/29/2012 100   . CO2 09/29/2012 26   . Glucose, Bld 09/29/2012 167*  . BUN 09/29/2012 47*  . Creatinine, Ser 09/29/2012 2.37*  . Calcium 09/29/2012 8.4   . GFR calc non Af Amer 09/29/2012 27*  . GFR calc Af Amer 09/29/2012 31*  . Sodium 09/30/2012 139   . Potassium 09/30/2012 4.1   . Chloride 09/30/2012 101   . CO2 09/30/2012 29   . Glucose, Bld 09/30/2012 125*  . BUN 09/30/2012 50*  . Creatinine, Ser 09/30/2012 2.21*  . Calcium 09/30/2012 8.4   . GFR calc non Af Amer 09/30/2012 29*  . GFR calc Af Amer 09/30/2012 34*  . WBC 09/30/2012 10.3   . RBC 09/30/2012 4.45   . Hemoglobin 09/30/2012 13.1    . HCT 09/30/2012 41.1   . MCV 09/30/2012 92.4   . [facility] 09/30/2012 29.4   . MCHC 09/30/2012 31.9   . RDW 09/30/2012 14.8   . Platelets 09/30/2012 228   . Sodium 10/01/2012 141   . Potassium 10/01/2012 4.1   . Chloride 10/01/2012 105   . CO2 10/01/2012 27   . Glucose, Bld 10/01/2012 94   . BUN 10/01/2012 48*  . Creatinine, Ser 10/01/2012 2.13*  . Calcium 10/01/2012 8.7   . GFR calc non Af Amer 10/01/2012 30*  . GFR calc Af Amer 10/01/2012 35*  Admission on 09/07/2012, Discharged on 09/15/2012  Component Date Value  . WBC 09/07/2012 11.5*  . RBC 09/07/2012 5.19   . Hemoglobin 09/07/2012 15.6   . HCT 09/07/2012 49.2   . MCV 09/07/2012 94.8   . MCH 09/07/2012 30.1   . MCHC 09/07/2012 31.7   . RDW 09/07/2012 16.5*  . Platelets 09/07/2012 159   . Neutrophils Relative 09/07/2012 81*  . Neutro Abs 09/07/2012 9.3*  . Lymphocytes Relative 09/07/2012 8*  . Lymphs Abs 09/07/2012 0.9   . Monocytes Relative 09/07/2012 9   . Monocytes Absolute 09/07/2012 1.0   . Eosinophils Relative 09/07/2012 2   . Eosinophils Absolute 09/07/2012 0.2   . Basophils Relative 09/07/2012 0   . Basophils Absolute 09/07/2012 0.0   . Sodium 09/07/2012 141   . Potassium 09/07/2012 4.4   . Chloride 09/07/2012 107   . CO2 09/07/2012 26   . Glucose, Bld 09/07/2012 80   . BUN 09/07/2012 49*  . Creatinine, Ser 09/07/2012 2.50*  . Calcium 09/07/2012 9.0   . Total Protein 09/07/2012 5.8*  . Albumin 09/07/2012 3.0*  . AST 09/07/2012 24   . ALT 09/07/2012 95*  . Alkaline Phosphatase 09/07/2012 38*  . Total Bilirubin 09/07/2012 0.6   . GFR calc non Af Amer 09/07/2012 25*  . GFR calc Af Amer 09/07/2012 29*  . Pro B Natriuretic peptid* 09/07/2012 3544.0*  . Troponin I 09/07/2012 <0.30   . Sodium 09/08/2012 140   . Potassium 09/08/2012 4.8   . Chloride 09/08/2012 108   . CO2 09/08/2012 27   . Glucose, Bld 09/08/2012 116*  . BUN 09/08/2012 43*  . Creatinine, Ser 09/08/2012 2.25*  . Calcium 09/08/2012 9.0    . Total Protein 09/08/2012 5.8*  . Albumin 09/08/2012 2.9*  . AST 09/08/2012 19   . ALT 09/08/2012 70*  . Alkaline Phosphatase 09/08/2012 37*  . Total Bilirubin 09/08/2012 0.7   . GFR calc non Af Amer 09/08/2012 28*  . GFR calc Af Denyse Dago  09/08/2012 33*  . Pro B Natriuretic peptid* 09/08/2012 3480.0*  . Total Protein, Urine 09/08/2012 29   . Creatinine, Urine 09/08/2012 30.64   . WBC 09/09/2012 11.5*  . RBC 09/09/2012 5.33   . Hemoglobin 09/09/2012 15.9   . HCT 09/09/2012 49.7   . MCV 09/09/2012 93.2   . MCH 09/09/2012 29.8   . MCHC 09/09/2012 32.0   . RDW 09/09/2012 16.4*  . Platelets 09/09/2012 139*  . Sodium 09/09/2012 140   . Potassium 09/09/2012 4.8   . Chloride 09/09/2012 105   . CO2 09/09/2012 29   . Glucose, Bld 09/09/2012 90   . BUN 09/09/2012 40*  . Creatinine, Ser 09/09/2012 2.36*  . Calcium 09/09/2012 8.8   . GFR calc non Af Amer 09/09/2012 27*  . GFR calc Af Amer 09/09/2012 31*  . PTH 09/09/2012 230.9*  . Calcium, Total (PTH) 09/09/2012 8.5   . Phosphorus 09/09/2012 3.2   . ASO 09/09/2012 96   . Sodium 09/10/2012 139   . Potassium 09/10/2012 4.7   . Chloride 09/10/2012 103   . CO2 09/10/2012 29   . Glucose, Bld 09/10/2012 108*  . BUN 09/10/2012 38*  . Creatinine, Ser 09/10/2012 2.21*  . Calcium 09/10/2012 9.0   . GFR calc non Af Amer 09/10/2012 29*  . GFR calc Af Amer 09/10/2012 34*  . WBC 09/10/2012 10.3   . RBC 09/10/2012 5.42   . Hemoglobin 09/10/2012 16.2   . HCT 09/10/2012 50.6   . MCV 09/10/2012 93.4   . MCH 09/10/2012 29.9   . MCHC 09/10/2012 32.0   . RDW 09/10/2012 16.1*  . Platelets 09/10/2012 137*  . Vancomycin Tr 09/10/2012 16.3   . Phosphorus 09/10/2012 3.1   . WBC 09/11/2012 9.7   . RBC 09/11/2012 5.19   . Hemoglobin 09/11/2012 15.6   . HCT 09/11/2012 48.5   . MCV 09/11/2012 93.4   . MCH 09/11/2012 30.1   . MCHC 09/11/2012 32.2   . RDW 09/11/2012 15.9*  . Platelets 09/11/2012 135*  . Sodium 09/11/2012 138   . Potassium  09/11/2012 4.6   . Chloride 09/11/2012 102   . CO2 09/11/2012 31   . Glucose, Bld 09/11/2012 105*  . BUN 09/11/2012 35*  . Creatinine, Ser 09/11/2012 2.21*  . Calcium 09/11/2012 8.7   . GFR calc non Af Amer 09/11/2012 29*  . GFR calc Af Amer 09/11/2012 34*  . Sodium 09/12/2012 141   . Potassium 09/12/2012 4.7   . Chloride 09/12/2012 102   . CO2 09/12/2012 31   . Glucose, Bld 09/12/2012 87   . BUN 09/12/2012 34*  . Creatinine, Ser 09/12/2012 2.15*  . Calcium 09/12/2012 8.7   . GFR calc non Af Amer 09/12/2012 30*  . GFR calc Af Amer 09/12/2012 35*  . Sodium 09/14/2012 138   . Potassium 09/14/2012 4.3   . Chloride 09/14/2012 98   . CO2 09/14/2012 33*  . Glucose, Bld 09/14/2012 77   . BUN 09/14/2012 36*  . Creatinine, Ser 09/14/2012 2.34*  . Calcium 09/14/2012 9.3   . GFR calc non Af Amer 09/14/2012 27*  . GFR calc Af Amer 09/14/2012 31*  . Glucose-Capillary 09/13/2012 114*  . Comment 1 09/13/2012 Notify RN   . Comment 2 09/13/2012 Documented in Chart   . Sodium 09/15/2012 135   . Potassium 09/15/2012 4.2   . Chloride 09/15/2012 96   . CO2 09/15/2012 30   . Glucose, Bld 09/15/2012 88   . BUN 09/15/2012 34*  .  Creatinine, Ser 09/15/2012 2.22*  . Calcium 09/15/2012 9.0   . GFR calc non Af Amer 09/15/2012 29*  . GFR calc Af Amer 09/15/2012 34*     Results for this Opt Visit:     Results for orders placed during the hospital encounter of 10/26/12  URINALYSIS, ROUTINE W REFLEX MICROSCOPIC      Component Value Range   Color, Urine YELLOW  YELLOW   APPearance CLEAR  CLEAR   Specific Gravity, Urine 1.025  1.005 - 1.030   pH 6.0  5.0 - 8.0   Glucose, UA NEGATIVE  NEGATIVE mg/dL   Hgb urine dipstick TRACE (*) NEGATIVE   Bilirubin Urine NEGATIVE  NEGATIVE   Ketones, ur NEGATIVE  NEGATIVE mg/dL   Protein, ur 161 (*) NEGATIVE mg/dL   Urobilinogen, UA 0.2  0.0 - 1.0 mg/dL   Nitrite NEGATIVE  NEGATIVE   Leukocytes, UA NEGATIVE  NEGATIVE  CBC WITH DIFFERENTIAL      Component  Value Range   WBC 8.4  4.0 - 10.5 K/uL   RBC 4.49  4.22 - 5.81 MIL/uL   Hemoglobin 13.3  13.0 - 17.0 g/dL   HCT 09.6  04.5 - 40.9 %   MCV 91.8  78.0 - 100.0 fL   MCH 29.6  26.0 - 34.0 pg   MCHC 32.3  30.0 - 36.0 g/dL   RDW 81.1 (*) 91.4 - 78.2 %   Platelets 192  150 - 400 K/uL   Neutrophils Relative 62  43 - 77 %   Neutro Abs 5.2  1.7 - 7.7 K/uL   Lymphocytes Relative 18  12 - 46 %   Lymphs Abs 1.5  0.7 - 4.0 K/uL   Monocytes Relative 12  3 - 12 %   Monocytes Absolute 1.0  0.1 - 1.0 K/uL   Eosinophils Relative 7 (*) 0 - 5 %   Eosinophils Absolute 0.6  0.0 - 0.7 K/uL   Basophils Relative 1  0 - 1 %   Basophils Absolute 0.1  0.0 - 0.1 K/uL  BASIC METABOLIC PANEL      Component Value Range   Sodium 138  135 - 145 mEq/L   Potassium 4.9  3.5 - 5.1 mEq/L   Chloride 105  96 - 112 mEq/L   CO2 25  19 - 32 mEq/L   Glucose, Bld 99  70 - 99 mg/dL   BUN 32 (*) 6 - 23 mg/dL   Creatinine, Ser 9.56 (*) 0.50 - 1.35 mg/dL   Calcium 8.9  8.4 - 21.3 mg/dL   GFR calc non Af Amer 27 (*) >90 mL/min   GFR calc Af Amer 31 (*) >90 mL/min  URINE MICROSCOPIC-ADD ON      Component Value Range   RBC / HPF TOO NUMEROUS TO COUNT  <3 RBC/hpf  TROPONIN I      Component Value Range   Troponin I <0.30  <0.30 ng/mL  PRO B NATRIURETIC PEPTIDE      Component Value Range   Pro B Natriuretic peptide (BNP) 9706.0 (*) 0 - 125 pg/mL  BASIC METABOLIC PANEL      Component Value Range   Sodium 140  135 - 145 mEq/L   Potassium 5.2 (*) 3.5 - 5.1 mEq/L   Chloride 105  96 - 112 mEq/L   CO2 25  19 - 32 mEq/L   Glucose, Bld 87  70 - 99 mg/dL   BUN 33 (*) 6 - 23 mg/dL   Creatinine, Ser 0.86 (*)  0.50 - 1.35 mg/dL   Calcium 8.9  8.4 - 08.6 mg/dL   GFR calc non Af Amer 25 (*) >90 mL/min   GFR calc Af Amer 29 (*) >90 mL/min  CBC      Component Value Range   WBC 8.0  4.0 - 10.5 K/uL   RBC 4.53  4.22 - 5.81 MIL/uL   Hemoglobin 13.3  13.0 - 17.0 g/dL   HCT 57.8  46.9 - 62.9 %   MCV 91.6  78.0 - 100.0 fL   MCH 29.4   26.0 - 34.0 pg   MCHC 32.0  30.0 - 36.0 g/dL   RDW 52.8 (*) 41.3 - 24.4 %   Platelets 177  150 - 400 K/uL  CLOSTRIDIUM DIFFICILE BY PCR      Component Value Range   C difficile by pcr NEGATIVE  NEGATIVE  BASIC METABOLIC PANEL      Component Value Range   Sodium 139  135 - 145 mEq/L   Potassium 3.9  3.5 - 5.1 mEq/L   Chloride 105  96 - 112 mEq/L   CO2 25  19 - 32 mEq/L   Glucose, Bld 100 (*) 70 - 99 mg/dL   BUN 35 (*) 6 - 23 mg/dL   Creatinine, Ser 0.10 (*) 0.50 - 1.35 mg/dL   Calcium 8.1 (*) 8.4 - 10.5 mg/dL   GFR calc non Af Amer 26 (*) >90 mL/min   GFR calc Af Amer 31 (*) >90 mL/min  T4, FREE      Component Value Range   Free T4 0.77 (*) 0.80 - 1.80 ng/dL  TSH      Component Value Range   TSH 0.427  0.350 - 4.500 uIU/mL  HEMOGLOBIN A1C      Component Value Range   Hemoglobin A1C 6.1 (*) <5.7 %   Mean Plasma Glucose 128 (*) <117 mg/dL  BASIC METABOLIC PANEL      Component Value Range   Sodium 140  135 - 145 mEq/L   Potassium 4.0  3.5 - 5.1 mEq/L   Chloride 103  96 - 112 mEq/L   CO2 28  19 - 32 mEq/L   Glucose, Bld 94  70 - 99 mg/dL   BUN 34 (*) 6 - 23 mg/dL   Creatinine, Ser 2.72 (*) 0.50 - 1.35 mg/dL   Calcium 8.6  8.4 - 53.6 mg/dL   GFR calc non Af Amer 27 (*) >90 mL/min   GFR calc Af Amer 31 (*) >90 mL/min  BASIC METABOLIC PANEL      Component Value Range   Sodium 135  135 - 145 mEq/L   Potassium 4.5  3.5 - 5.1 mEq/L   Chloride 99  96 - 112 mEq/L   CO2 28  19 - 32 mEq/L   Glucose, Bld 93  70 - 99 mg/dL   BUN 30 (*) 6 - 23 mg/dL   Creatinine, Ser 6.44 (*) 0.50 - 1.35 mg/dL   Calcium 8.9  8.4 - 03.4 mg/dL   GFR calc non Af Amer 29 (*) >90 mL/min   GFR calc Af Amer 34 (*) >90 mL/min  BASIC METABOLIC PANEL      Component Value Range   Sodium 139  135 - 145 mEq/L   Potassium 4.2  3.5 - 5.1 mEq/L   Chloride 101  96 - 112 mEq/L   CO2 29  19 - 32 mEq/L   Glucose, Bld 90  70 - 99 mg/dL   BUN 30 (*)  6 - 23 mg/dL   Creatinine, Ser 1.61 (*) 0.50 - 1.35 mg/dL    Calcium 8.9  8.4 - 09.6 mg/dL   GFR calc non Af Amer 29 (*) >90 mL/min   GFR calc Af Amer 34 (*) >90 mL/min    EKG Orders placed during the hospital encounter of 10/26/12  . ED EKG  . ED EKG  . EKG 12-LEAD  . EKG 12-LEAD  . 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  There is moderate cardiomegaly present.  There is a right pleural effusion noted and perhaps mild pulmonary vascular congestion.  IMPRESSION: Moderate cardiomegaly.  Poor inspiration with right effusion and probable mild pulmonary vascular congestion.   Original Report Authenticated By: Dwyane Dee, M.D.      [facility] Calculation: Score not calculated. Missing: Total Cholesterol, HDL

## 2012-11-18 NOTE — Assessment & Plan Note (Addendum)
Renal dysfunction has progressed from mild to moderate over the course of 2 years.

## 2012-11-18 NOTE — Assessment & Plan Note (Signed)
Fluid retention contributed to excess weight.  Following diuresis, he is no longer classified as morbidly obese.

## 2012-11-19 ENCOUNTER — Encounter: Payer: Self-pay | Admitting: Cardiology

## 2012-11-19 LAB — COMPREHENSIVE METABOLIC PANEL
ALT: 13 U/L (ref 0–53)
AST: 18 U/L (ref 0–37)
Albumin: 4.1 g/dL (ref 3.5–5.2)
BUN: 41 mg/dL — ABNORMAL HIGH (ref 6–23)
Calcium: 8.9 mg/dL (ref 8.4–10.5)
Chloride: 99 mEq/L (ref 96–112)
Potassium: 4.3 mEq/L (ref 3.5–5.3)
Sodium: 141 mEq/L (ref 135–145)
Total Protein: 7.1 g/dL (ref 6.0–8.3)

## 2012-11-19 NOTE — Assessment & Plan Note (Signed)
Slightly low free T4 with low TSH and no clinical signs or symptoms of hyper-or hypothyroidism; current therapy appears reasonable.

## 2012-11-20 ENCOUNTER — Encounter: Payer: Self-pay | Admitting: Cardiology

## 2012-11-23 ENCOUNTER — Encounter: Payer: Self-pay | Admitting: *Deleted

## 2012-12-17 ENCOUNTER — Ambulatory Visit: Payer: Medicare Other | Admitting: Cardiology

## 2012-12-21 ENCOUNTER — Encounter: Payer: Self-pay | Admitting: Cardiology

## 2012-12-21 ENCOUNTER — Ambulatory Visit (INDEPENDENT_AMBULATORY_CARE_PROVIDER_SITE_OTHER): Payer: Medicare Other | Admitting: Cardiology

## 2012-12-21 VITALS — BP 148/71 | HR 102 | Ht 73.0 in | Wt 274.0 lb

## 2012-12-21 DIAGNOSIS — I359 Nonrheumatic aortic valve disorder, unspecified: Secondary | ICD-10-CM

## 2012-12-21 DIAGNOSIS — E230 Hypopituitarism: Secondary | ICD-10-CM

## 2012-12-21 DIAGNOSIS — I509 Heart failure, unspecified: Secondary | ICD-10-CM

## 2012-12-21 DIAGNOSIS — N189 Chronic kidney disease, unspecified: Secondary | ICD-10-CM

## 2012-12-21 DIAGNOSIS — I10 Essential (primary) hypertension: Secondary | ICD-10-CM

## 2012-12-21 DIAGNOSIS — E785 Hyperlipidemia, unspecified: Secondary | ICD-10-CM

## 2012-12-21 MED ORDER — AMLODIPINE BESYLATE 5 MG PO TABS: 5.0000 mg | ORAL_TABLET | Freq: Every day | ORAL | Status: DC

## 2012-12-21 NOTE — Assessment & Plan Note (Signed)
Lipid profile will be drawn.

## 2012-12-21 NOTE — Assessment & Plan Note (Signed)
Moderate chronic renal insufficiency increases the risk of cardiac catheterization. A repeat metabolic profile will be obtained to reassess creatinine.

## 2012-12-21 NOTE — Progress Notes (Deleted)
Name: [name] [name]    DOB: May 28, 1946  Age: 67 y.o.  MR#: [id number]       PCP:  Avon Gully, MD      Insurance: @PAYORNAME @   CC:   No chief complaint on file.  MEDICATION BOTTLES  VS BP 148/71  Pulse 102  Ht 6\' 1"  (1.854 m)  Wt 274 lb (124.286 kg)  BMI 36.15 kg/m2  Weights Current Weight  12/21/12 274 lb (124.286 kg)  11/18/12 273 lb (123.832 kg)  10/31/12 262 lb (118.842 kg)    Blood Pressure  BP Readings from Last 3 Encounters:  12/21/12 148/71  11/18/12 122/82  10/31/12 115/58     Admit date:  (Not on file) Last encounter with RMR:  11/20/2012   Allergy No Known Allergies  Current Outpatient Prescriptions  Medication Sig Dispense Refill  . albuterol (PROVENTIL HFA;VENTOLIN HFA) 108 (90 BASE) MCG/ACT inhaler Inhale 2 puffs into the lungs every 6 (six) hours as needed. Bronchitis      . albuterol (PROVENTIL) (2.5 MG/3ML) 0.083% nebulizer solution Take 3 mLs (2.5 mg total) by nebulization every 6 (six) hours as needed for wheezing.  75 mL  12  . budesonide-formoterol (SYMBICORT) 160-4.5 MCG/ACT inhaler Inhale 2 puffs into the lungs 2 (two) times daily.      . calcitRIOL (ROCALTROL) 0.25 MCG capsule Take 1 capsule (0.25 mcg total) by mouth 3 (three) times a week.  30 capsule  3  . cloNIDine (CATAPRES) 0.1 MG tablet Take 0.1 mg by mouth 3 (three) times daily.      . collagenase (SANTYL) ointment Apply topically daily.  15 g  3  . desmopressin (DDAVP) 0.2 MG tablet Take 0.2 mg by mouth 2 (two) times daily.      Marland Kitchen dexamethasone (DECADRON) 0.5 MG tablet Take 0.5 mg by mouth daily.      . furosemide (LASIX) 40 MG tablet Take 1.5 tablets (60 mg total) by mouth 2 (two) times daily.  45 tablet  3  . levothyroxine (SYNTHROID, LEVOTHROID) 150 MCG tablet Take 150 mcg by mouth daily.      . phenytoin (DILANTIN) 100 MG ER capsule Take 200-300 mg by mouth daily. Takes 200 mg on Monday and Thursdsay. On all other days of the week takes 300 mg.      . simvastatin (ZOCOR) 80 MG tablet  Take 80 mg by mouth at bedtime.      Marland Kitchen tiotropium (SPIRIVA) 18 MCG inhalation capsule Place 18 mcg into inhaler and inhale daily.      Marland Kitchen torsemide (DEMADEX) 100 MG tablet 0.5  Tablet daily. Weigh yourself daily and if your weight goes up three pounds take another 0.5 tab  30 tablet  12    Discontinued Meds:   There are no discontinued medications.  Patient Active Problem List  Diagnosis  . Obese  . Congestive heart failure-predominantly right-sided  . Hypertension  . Chronic kidney disease  . Sleep apnea  . Hypothyroid  . Tobacco abuse, in remission  . Hyperlipidemia  . Panhypopituitarism    LABS Office Visit on 11/18/2012  Component Date Value  . Sodium 11/18/2012 141   . Potassium 11/18/2012 4.3   . Chloride 11/18/2012 99   . CO2 11/18/2012 32   . Glucose, Bld 11/18/2012 102*  . BUN 11/18/2012 41*  . Creat 11/18/2012 2.25*  . Total Bilirubin 11/18/2012 0.5   . Alkaline Phosphatase 11/18/2012 43   . AST 11/18/2012 18   . ALT 11/18/2012 13   . Total  Protein 11/18/2012 7.1   . Albumin 11/18/2012 4.1   . Calcium 11/18/2012 8.9   . Brain Natriuretic Peptide 11/18/2012 734.5*  Admission on 10/26/2012, Discharged on 10/31/2012  Component Date Value  . Color, Urine 10/26/2012 YELLOW   . APPearance 10/26/2012 CLEAR   . Specific Gravity, Urine 10/26/2012 1.025   . pH 10/26/2012 6.0   . Glucose, UA 10/26/2012 NEGATIVE   . Hgb urine dipstick 10/26/2012 TRACE*  . Bilirubin Urine 10/26/2012 NEGATIVE   . Ketones, ur 10/26/2012 NEGATIVE   . Protein, ur 10/26/2012 100*  . Urobilinogen, UA 10/26/2012 0.2   . Nitrite 10/26/2012 NEGATIVE   . Leukocytes, UA 10/26/2012 NEGATIVE   . WBC 10/26/2012 8.4   . RBC 10/26/2012 4.49   . Hemoglobin 10/26/2012 13.3   . HCT 10/26/2012 41.2   . MCV 10/26/2012 91.8   . [facility] 10/26/2012 29.6   . MCHC 10/26/2012 32.3   . RDW 10/26/2012 17.3*  . Platelets 10/26/2012 192   . Neutrophils Relative 10/26/2012 62   . Neutro Abs 10/26/2012 5.2   .  Lymphocytes Relative 10/26/2012 18   . Lymphs Abs 10/26/2012 1.5   . Monocytes Relative 10/26/2012 12   . Monocytes Absolute 10/26/2012 1.0   . Eosinophils Relative 10/26/2012 7*  . Eosinophils Absolute 10/26/2012 0.6   . Basophils Relative 10/26/2012 1   . Basophils Absolute 10/26/2012 0.1   . Sodium 10/26/2012 138   . Potassium 10/26/2012 4.9   . Chloride 10/26/2012 105   . CO2 10/26/2012 25   . Glucose, Bld 10/26/2012 99   . BUN 10/26/2012 32*  . Creatinine, Ser 10/26/2012 2.35*  . Calcium 10/26/2012 8.9   . GFR calc non Af Amer 10/26/2012 27*  . GFR calc Af Amer 10/26/2012 31*  . RBC / HPF 10/26/2012 TOO NUMEROUS TO COUNT   . Troponin I 10/26/2012 <0.30   . Pro B Natriuretic peptid* 10/26/2012 9706.0*  . Sodium 10/27/2012 140   . Potassium 10/27/2012 5.2*  . Chloride 10/27/2012 105   . CO2 10/27/2012 25   . Glucose, Bld 10/27/2012 87   . BUN 10/27/2012 33*  . Creatinine, Ser 10/27/2012 2.50*  . Calcium 10/27/2012 8.9   . GFR calc non Af Amer 10/27/2012 25*  . GFR calc Af Amer 10/27/2012 29*  . WBC 10/27/2012 8.0   . RBC 10/27/2012 4.53   . Hemoglobin 10/27/2012 13.3   . HCT 10/27/2012 41.5   . MCV 10/27/2012 91.6   . [facility] 10/27/2012 29.4   . MCHC 10/27/2012 32.0   . RDW 10/27/2012 17.2*  . Platelets 10/27/2012 177   . C difficile by pcr 10/27/2012 NEGATIVE   . Sodium 10/28/2012 139   . Potassium 10/28/2012 3.9   . Chloride 10/28/2012 105   . CO2 10/28/2012 25   . Glucose, Bld 10/28/2012 100*  . BUN 10/28/2012 35*  . Creatinine, Ser 10/28/2012 2.39*  . Calcium 10/28/2012 8.1*  . GFR calc non Af Amer 10/28/2012 26*  . GFR calc Af Amer 10/28/2012 31*  . Free T4 10/28/2012 0.77*  . TSH 10/28/2012 0.427   . Hemoglobin A1C 10/28/2012 6.1*  . Mean Plasma Glucose 10/28/2012 128*  . Sodium 10/29/2012 140   . Potassium 10/29/2012 4.0   . Chloride 10/29/2012 103   . CO2 10/29/2012 28   . Glucose, Bld 10/29/2012 94   . BUN 10/29/2012 34*  . Creatinine, Ser  10/29/2012 2.35*  . Calcium 10/29/2012 8.6   . GFR calc non Af Denyse Dago 10/29/2012  27*  . GFR calc Af Amer 10/29/2012 31*  . Sodium 10/30/2012 135   . Potassium 10/30/2012 4.5   . Chloride 10/30/2012 99   . CO2 10/30/2012 28   . Glucose, Bld 10/30/2012 93   . BUN 10/30/2012 30*  . Creatinine, Ser 10/30/2012 2.19*  . Calcium 10/30/2012 8.9   . GFR calc non Af Amer 10/30/2012 29*  . GFR calc Af Amer 10/30/2012 34*  . Sodium 10/31/2012 139   . Potassium 10/31/2012 4.2   . Chloride 10/31/2012 101   . CO2 10/31/2012 29   . Glucose, Bld 10/31/2012 90   . BUN 10/31/2012 30*  . Creatinine, Ser 10/31/2012 2.20*  . Calcium 10/31/2012 8.9   . GFR calc non Af Amer 10/31/2012 29*  . GFR calc Af Amer 10/31/2012 34*  Admission on 09/26/2012, Discharged on 10/01/2012  Component Date Value  . Pro B Natriuretic peptid* 09/26/2012 2784.0*  . Troponin I 09/26/2012 <0.30   . Color, Urine 09/27/2012 YELLOW   . APPearance 09/27/2012 CLEAR   . Specific Gravity, Urine 09/27/2012 1.015   . pH 09/27/2012 6.0   . Glucose, UA 09/27/2012 NEGATIVE   . Hgb urine dipstick 09/27/2012 NEGATIVE   . Bilirubin Urine 09/27/2012 NEGATIVE   . Ketones, ur 09/27/2012 NEGATIVE   . Protein, ur 09/27/2012 30*  . Urobilinogen, UA 09/27/2012 0.2   . Nitrite 09/27/2012 NEGATIVE   . Leukocytes, UA 09/27/2012 NEGATIVE   . Specimen Description 09/27/2012 URINE, CLEAN CATCH   . Special Requests 09/27/2012 NONE   . Culture  Setup Time 09/27/2012 09/28/2012 01:21   . Colony Count 09/27/2012 >=100,000 COLONIES/ML   . Culture 09/27/2012 PROTEUS MIRABILIS   . Report Status 09/27/2012 09/30/2012 FINAL   . Organism ID, Bacteria 09/27/2012 PROTEUS MIRABILIS   . Sodium 09/26/2012 140   . Potassium 09/26/2012 4.2   . Chloride 09/26/2012 102   . CO2 09/26/2012 28   . Glucose, Bld 09/26/2012 110*  . BUN 09/26/2012 26*  . Creatinine, Ser 09/26/2012 2.22*  . Calcium 09/26/2012 8.9   . GFR calc non Af Amer 09/26/2012 29*  . GFR  calc Af Amer 09/26/2012 34*  . WBC 09/26/2012 7.7   . RBC 09/26/2012 4.73   . Hemoglobin 09/26/2012 14.0   . HCT 09/26/2012 44.1   . MCV 09/26/2012 93.2   . MCH 09/26/2012 29.6   . MCHC 09/26/2012 31.7   . RDW 09/26/2012 15.0   . Platelets 09/26/2012 225   . Neutrophils Relative 09/26/2012 64   . Neutro Abs 09/26/2012 4.9   . Lymphocytes Relative 09/26/2012 13   . Lymphs Abs 09/26/2012 1.0   . Monocytes Relative 09/26/2012 11   . Monocytes Absolute 09/26/2012 0.8   . Eosinophils Relative 09/26/2012 12*  . Eosinophils Absolute 09/26/2012 0.9*  . Basophils Relative 09/26/2012 1   . Basophils Absolute 09/26/2012 0.1   . FIO2 09/26/2012 40.00   . Delivery systems 09/26/2012 BILEVEL POSITIVE AIRWAY PRESSURE   . Mode 09/26/2012 BILEVEL POSITIVE AIRWAY PRESSURE   . Inspiratory PAP 09/26/2012 16   . Expiratory PAP 09/26/2012 8   . pH, Arterial 09/26/2012 7.357   . pCO2 arterial 09/26/2012 48.1*  . pO2, Arterial 09/26/2012 112.0*  . Bicarbonate 09/26/2012 26.3*  . TCO2 09/26/2012 22.8   . Acid-Base Excess 09/26/2012 1.4   . O2 Saturation 09/26/2012 97.7   . Collection site 09/26/2012 RIGHT RADIAL   . Drawn by 09/26/2012 COLLECTED BY RT   . Sample type 09/26/2012 ARTERIAL   .  Allens test (pass/fail) 09/26/2012 PASS   . MRSA by PCR 09/26/2012 POSITIVE*  . Vancomycin Tr 09/27/2012 13.4   . Squamous Epithelial / LPF 09/27/2012 RARE   . WBC, UA 09/27/2012 0-2   . RBC / HPF 09/27/2012 0-2   . Bacteria, UA 09/27/2012 RARE   . Sodium 09/29/2012 136   . Potassium 09/29/2012 4.5   . Chloride 09/29/2012 100   . CO2 09/29/2012 26   . Glucose, Bld 09/29/2012 167*  . BUN 09/29/2012 47*  . Creatinine, Ser 09/29/2012 2.37*  . Calcium 09/29/2012 8.4   . GFR calc non Af Amer 09/29/2012 27*  . GFR calc Af Amer 09/29/2012 31*  . Sodium 09/30/2012 139   . Potassium 09/30/2012 4.1   . Chloride 09/30/2012 101   . CO2 09/30/2012 29   . Glucose, Bld 09/30/2012 125*  . BUN 09/30/2012 50*  .  Creatinine, Ser 09/30/2012 2.21*  . Calcium 09/30/2012 8.4   . GFR calc non Af Amer 09/30/2012 29*  . GFR calc Af Amer 09/30/2012 34*  . WBC 09/30/2012 10.3   . RBC 09/30/2012 4.45   . Hemoglobin 09/30/2012 13.1   . HCT 09/30/2012 41.1   . MCV 09/30/2012 92.4   . [facility] 09/30/2012 29.4   . MCHC 09/30/2012 31.9   . RDW 09/30/2012 14.8   . Platelets 09/30/2012 228   . Sodium 10/01/2012 141   . Potassium 10/01/2012 4.1   . Chloride 10/01/2012 105   . CO2 10/01/2012 27   . Glucose, Bld 10/01/2012 94   . BUN 10/01/2012 48*  . Creatinine, Ser 10/01/2012 2.13*  . Calcium 10/01/2012 8.7   . GFR calc non Af Amer 10/01/2012 30*  . GFR calc Af Amer 10/01/2012 35*     Results for this Opt Visit:     Results for orders placed in visit on 11/18/12  COMPREHENSIVE METABOLIC PANEL      Component Value Range   Sodium 141  135 - 145 mEq/L   Potassium 4.3  3.5 - 5.3 mEq/L   Chloride 99  96 - 112 mEq/L   CO2 32  19 - 32 mEq/L   Glucose, Bld 102 (*) 70 - 99 mg/dL   BUN 41 (*) 6 - 23 mg/dL   Creat 4.09 (*) 8.11 - 1.35 mg/dL   Total Bilirubin 0.5  0.3 - 1.2 mg/dL   Alkaline Phosphatase 43  39 - 117 U/L   AST 18  0 - 37 U/L   ALT 13  0 - 53 U/L   Total Protein 7.1  6.0 - 8.3 g/dL   Albumin 4.1  3.5 - 5.2 g/dL   Calcium 8.9  8.4 - 91.4 mg/dL  BRAIN NATRIURETIC PEPTIDE      Component Value Range   Brain Natriuretic Peptide 734.5 (*) 0.0 - 100.0 pg/mL    EKG Orders placed during the hospital encounter of 10/26/12  . ED EKG  . ED EKG  . EKG 12-LEAD  . EKG 12-LEAD  . EKG     Prior Assessment and Plan Problem List as of 12/21/2012            Cardiology Problems   Congestive heart failure-predominantly right-sided   Last Assessment & Plan Note   11/18/2012 Office Visit Addendum 11/19/2012 10:59 AM by Kathlen Brunswick, MD    No further diuresis since hospital discharge; in fact, he may have gained some weight.  Dose of furosemide will be increased to 60 mg twice a day pending  laboratory  monitoring studies.      Hypertension   Last Assessment & Plan Note   11/18/2012 Office Visit Signed 11/18/2012  3:33 PM by Kathlen Brunswick, MD    No blood pressure elevations for at least the past few months.  Current antihypertensive medications will be continued except hydralazine, which is likely contributing little if anything.    Hyperlipidemia   Last Assessment & Plan Note   11/18/2012 Office Visit Signed 11/18/2012  3:32 PM by Kathlen Brunswick, MD    Recent lipid profile is being sought.  If not available, one will be drawn.      Other   Obese   Last Assessment & Plan Note   11/18/2012 Office Visit Signed 11/18/2012  3:35 PM by Kathlen Brunswick, MD    Fluid retention contributed to excess weight.  Following diuresis, he is no longer classified as morbidly obese.    Chronic kidney disease   Last Assessment & Plan Note   11/18/2012 Office Visit Addendum 11/20/2012 11:25 AM by Kathlen Brunswick, MD    Renal dysfunction has progressed from mild to moderate over the course of 2 years.    Sleep apnea   Last Assessment & Plan Note   11/18/2012 Office Visit Signed 11/18/2012  3:28 PM by Kathlen Brunswick, MD    Sleep apnea is likely persistent and contributing to patient's multiple medical problems.  A return visit to a sleep specialist will be desirable but will be deferred for the present.    Hypothyroid   Last Assessment & Plan Note   11/18/2012 Office Visit Signed 11/19/2012 10:53 AM by Kathlen Brunswick, MD    Slightly low free T4 with low TSH and no clinical signs or symptoms of hyper-or hypothyroidism; current therapy appears reasonable.    Tobacco abuse, in remission   Panhypopituitarism   Last Assessment & Plan Note   11/18/2012 Office Visit Signed 11/18/2012  3:31 PM by Kathlen Brunswick, MD    Consultation with an endocrinologist, Dr. Fransico Him, is scheduled within the next few weeks.  [name] taking no mineralocorticoid, but does not appear to have an electrolyte  disturbance nor fluid/sodium loss as a result.        Imaging: No results found.   FRS Calculation: Score not calculated. Missing: Total Cholesterol, HDL

## 2012-12-21 NOTE — Progress Notes (Signed)
Patient ID: [name] [name], male   DOB: 1946-08-24, 67 y.o.   MRN: [id number]  HPI: Scheduled return visit for this nice but laconic gentleman with presumed coronary artery disease, manifest, valvular heart disease, sleep apnea and hypertension. He was recently treated for the new onset of congestive heart failure and has improved substantially, but continues to have class III dyspnea on exertion. He has had a previous nephrectomy and has moderately impaired renal function with a component of cardiorenal syndrome. 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  Prior to Admission medications   Medication Sig Start Date End Date Taking? Authorizing Provider  albuterol (PROVENTIL HFA;VENTOLIN HFA) 108 (90 BASE) MCG/ACT inhaler Inhale 2 puffs into the lungs every 6 (six) hours as needed. Bronchitis   Yes Historical Provider, MD  albuterol (PROVENTIL) (2.5 MG/3ML) 0.083% nebulizer solution Take 3 mLs (2.5 mg total) by nebulization every 6 (six) hours as needed for wheezing. 10/31/12  Yes Fredirick Maudlin, MD  budesonide-formoterol [facility]) 160-4.5 MCG/ACT inhaler Inhale 2 puffs into the lungs 2 (two) times daily.   Yes Historical Provider, MD  calcitRIOL (ROCALTROL) 0.25 MCG capsule Take 1 capsule (0.25 mcg total) by mouth 3 (three) times a week. 09/15/12  Yes Avon Gully, MD  cloNIDine (CATAPRES) 0.1 MG tablet Take 0.1 mg by mouth 3 (three) times daily.   Yes Historical Provider, MD  collagenase (SANTYL) ointment Apply topically daily. 09/15/12  Yes Avon Gully, MD  desmopressin (DDAVP) 0.2 MG tablet Take 0.2 mg by mouth 2 (two) times daily.   Yes Historical Provider, MD  dexamethasone (DECADRON) 0.5 MG tablet Take 0.5 mg by mouth daily.   Yes Historical Provider, MD  furosemide (LASIX) 40 MG tablet Take 1.5 tablets (60 mg total) by mouth 2 (two) times daily. 11/18/12  Yes Kathlen Brunswick, MD  levothyroxine (SYNTHROID, LEVOTHROID) 150 MCG tablet Take 150  mcg by mouth daily.   Yes Historical Provider, MD  phenytoin (DILANTIN) 100 MG ER capsule Take 200-300 mg by mouth daily. Takes 200 mg on Monday and Thursdsay. On all other days of the week takes 300 mg.   Yes Historical Provider, MD  simvastatin (ZOCOR) 80 MG tablet Take 80 mg by mouth at bedtime.   Yes Historical Provider, MD  tiotropium (SPIRIVA) 18 MCG inhalation capsule Place 18 mcg into inhaler and inhale daily.   Yes Historical Provider, MD  torsemide (DEMADEX) 100 MG tablet 0.5  Tablet daily. Weigh yourself daily and if your weight goes up three pounds take another 0.5 tab 10/31/12  Yes Fredirick Maudlin, MD  amLODipine (NORVASC) 5 MG tablet Take 1 tablet (5 mg total) by mouth daily. 12/21/12   Kathlen Brunswick, MD  No Known Allergies    Past medical history, social history, and family history reviewed and updated.  ROS: Denies chest discomfort, lightheadedness, syncope, cough, sputum production or fever. All other systems reviewed and are negative.  BP 148/71  Pulse 102  Ht 6\' 1"  (1.854 m)  Wt 124.286 kg (274 lb)  BMI 36.15 kg/m2   General-Well developed; no acute distress Body habitus-moderately overweight Neck-No JVD; no carotid bruits Lungs-clear lung fields; resonant to percussion Cardiovascular-normal PMI; normal S1 and S2 Abdomen-normal bowel sounds; soft and non-tender without masses or organomegaly Musculoskeletal-No deformities, no cyanosis or clubbing Neurologic-Normal cranial nerves; symmetric strength and tone Skin-Warm, no significant lesions Extremities-distal pulses intact; no edema  ASSESSMENT AND PLAN:  [place] Bing, MD 12/21/2012 2:58 PM

## 2012-12-21 NOTE — Assessment & Plan Note (Signed)
Blood pressure control is reasonably good. Amlodipine will be added to further lower blood pressure and as possible treatment for chronic AI.

## 2012-12-21 NOTE — Patient Instructions (Addendum)
Your physician recommends that you schedule a follow-up appointment in: 2-3 weeks  Your physician recommends that you return for lab work in: Today  Your physician has recommended you make the following change in your medication:  1 - START Amlodipine 5 mg daily

## 2012-12-21 NOTE — Assessment & Plan Note (Addendum)
Improved with current therapy.  Possible sleep apnea and Pickwickian Syndrome could be contributing. I will discuss past evaluation and attempted treatment for obstructive sleep apnea with Dr. Juanetta Gosling.

## 2012-12-21 NOTE — Assessment & Plan Note (Signed)
Previous craniotomy and transsphenoidal surgery as well as panhypopituitarism further increases the risk of open heart surgery and will be considered in planning further evaluation and treatment for patient's cardiac problems.

## 2012-12-21 NOTE — Assessment & Plan Note (Signed)
Aortic insufficiency murmur is consistent with hemodynamically significant AI, which also would explain left ventricular dilatation, mildly impaired left ventricular systolic function and congestive heart failure. We discussed the possibility of cardiac catheterization and aortic valve replacement surgery. Patient would be at significant risk for both catheterization with intravenous contrast and cardiac surgery. He will consider these options over the next few weeks while I discussed his case with other physicians and review the echocardiogram.

## 2012-12-22 LAB — BASIC METABOLIC PANEL
BUN: 63 mg/dL — ABNORMAL HIGH (ref 6–23)
Creat: 2.56 mg/dL — ABNORMAL HIGH (ref 0.50–1.35)
Glucose, Bld: 107 mg/dL — ABNORMAL HIGH (ref 70–99)

## 2012-12-22 LAB — LIPID PANEL
Cholesterol: 161 mg/dL (ref 0–200)
LDL Cholesterol: 102 mg/dL — ABNORMAL HIGH (ref 0–99)
Triglycerides: 122 mg/dL (ref <150)

## 2012-12-23 ENCOUNTER — Encounter: Payer: Self-pay | Admitting: Cardiology

## 2012-12-28 ENCOUNTER — Telehealth: Payer: Self-pay | Admitting: *Deleted

## 2012-12-28 NOTE — Telephone Encounter (Signed)
PT CALLED BACK 12/28/12, HE CAN NOT REMEMBER WHAT MEDICATION [name] TO STOP TAKING

## 2012-12-28 NOTE — Telephone Encounter (Signed)
Verified medication bottle

## 2012-12-28 NOTE — Telephone Encounter (Signed)
Message copied by Gaynelle Adu on Tue Dec 28, 2012  9:32 AM ------      Message from: Kathlen Brunswick      Created: Thu Dec 23, 2012  7:11 PM      Regarding: Medications       Medication list includes moderate dose furosemide and torsemide-which is he taking?            Thanks

## 2012-12-28 NOTE — Telephone Encounter (Signed)
Patient states he has been taking both for quite some time.  Advised him that, in light of his recent lab results, to hold his torsemide until a recommendation made by Dr Dietrich Pates for future diuretic treatment.

## 2013-01-03 ENCOUNTER — Telehealth: Payer: Self-pay | Admitting: Cardiology

## 2013-01-03 ENCOUNTER — Other Ambulatory Visit: Payer: Self-pay | Admitting: *Deleted

## 2013-01-03 NOTE — Telephone Encounter (Signed)
Discussed diuretic use with Dr Dietrich Pates, to include, torsemide, which was held on Friday, due to increased renal insufficiency.  Pt states increased weight gain.  Appointment made with Joni Reining tomorrow to address this issue.

## 2013-01-03 NOTE — Telephone Encounter (Signed)
PT STATES HE HAS SOME FLUID GAIN AGAIN AND WOULD LIKE TO KNOW IF HE CAN START TAKING FLUID PILLS AGAIN

## 2013-01-04 ENCOUNTER — Ambulatory Visit: Payer: Medicare Other | Admitting: Adult Health

## 2013-01-07 ENCOUNTER — Inpatient Hospital Stay (HOSPITAL_COMMUNITY)
Admission: EM | Admit: 2013-01-07 | Discharge: 2013-01-13 | DRG: 292 | Disposition: A | Discharge status: Home / Self Care | Payer: Medicare Other | Attending: Internal Medicine | Admitting: Internal Medicine

## 2013-01-07 ENCOUNTER — Emergency Department (HOSPITAL_COMMUNITY): Payer: Medicare Other

## 2013-01-07 ENCOUNTER — Encounter (HOSPITAL_COMMUNITY): Payer: Self-pay | Admitting: *Deleted

## 2013-01-07 DIAGNOSIS — E893 Postprocedural hypopituitarism: Secondary | ICD-10-CM | POA: Diagnosis present

## 2013-01-07 DIAGNOSIS — E236 Other disorders of pituitary gland: Secondary | ICD-10-CM | POA: Diagnosis present

## 2013-01-07 DIAGNOSIS — Z87891 Personal history of nicotine dependence: Secondary | ICD-10-CM

## 2013-01-07 DIAGNOSIS — I5033 Acute on chronic diastolic (congestive) heart failure: Principal | ICD-10-CM | POA: Diagnosis present

## 2013-01-07 DIAGNOSIS — G40909 Epilepsy, unspecified, not intractable, without status epilepticus: Secondary | ICD-10-CM | POA: Diagnosis present

## 2013-01-07 DIAGNOSIS — Z6836 Body mass index (BMI) 36.0-36.9, adult: Secondary | ICD-10-CM

## 2013-01-07 DIAGNOSIS — E785 Hyperlipidemia, unspecified: Secondary | ICD-10-CM | POA: Diagnosis present

## 2013-01-07 DIAGNOSIS — Y836 Removal of other organ (partial) (total) as the cause of abnormal reaction of the patient, or of later complication, without mention of misadventure at the time of the procedure: Secondary | ICD-10-CM | POA: Diagnosis present

## 2013-01-07 DIAGNOSIS — I509 Heart failure, unspecified: Secondary | ICD-10-CM | POA: Diagnosis present

## 2013-01-07 DIAGNOSIS — Z79899 Other long term (current) drug therapy: Secondary | ICD-10-CM

## 2013-01-07 DIAGNOSIS — J441 Chronic obstructive pulmonary disease with (acute) exacerbation: Secondary | ICD-10-CM | POA: Diagnosis present

## 2013-01-07 DIAGNOSIS — G4730 Sleep apnea, unspecified: Secondary | ICD-10-CM | POA: Diagnosis present

## 2013-01-07 DIAGNOSIS — N189 Chronic kidney disease, unspecified: Secondary | ICD-10-CM | POA: Diagnosis present

## 2013-01-07 DIAGNOSIS — Z9981 Dependence on supplemental oxygen: Secondary | ICD-10-CM

## 2013-01-07 DIAGNOSIS — I129 Hypertensive chronic kidney disease with stage 1 through stage 4 chronic kidney disease, or unspecified chronic kidney disease: Secondary | ICD-10-CM | POA: Diagnosis present

## 2013-01-07 DIAGNOSIS — E230 Hypopituitarism: Secondary | ICD-10-CM

## 2013-01-07 DIAGNOSIS — I359 Nonrheumatic aortic valve disorder, unspecified: Secondary | ICD-10-CM | POA: Diagnosis present

## 2013-01-07 LAB — COMPREHENSIVE METABOLIC PANEL
AST: 48 U/L — ABNORMAL HIGH (ref 0–37)
Albumin: 3.4 g/dL — ABNORMAL LOW (ref 3.5–5.2)
Alkaline Phosphatase: 45 U/L (ref 39–117)
BUN: 53 mg/dL — ABNORMAL HIGH (ref 6–23)
CO2: 29 mEq/L (ref 19–32)
Chloride: 99 mEq/L (ref 96–112)
Creatinine, Ser: 2.29 mg/dL — ABNORMAL HIGH (ref 0.50–1.35)
GFR calc non Af Amer: 28 mL/min — ABNORMAL LOW (ref >90)
Potassium: 4.4 mEq/L (ref 3.5–5.1)
Total Bilirubin: 0.2 mg/dL — ABNORMAL LOW (ref 0.3–1.2)

## 2013-01-07 LAB — CBC
Hemoglobin: 12.8 g/dL — ABNORMAL LOW (ref 13.0–17.0)
MCH: 29.9 pg (ref 26.0–34.0)
MCV: 93.9 fL (ref 78.0–100.0)
RBC: 4.28 MIL/uL (ref 4.22–5.81)
WBC: 9.5 10*3/uL (ref 4.0–10.5)

## 2013-01-07 LAB — TROPONIN I: Troponin I: <0.3 ng/mL (ref <0.30)

## 2013-01-07 MED ORDER — PHENYTOIN SODIUM EXTENDED 100 MG PO CAPS: 200.0000 mg | ORAL_CAPSULE | Freq: Every day | ORAL | Status: DC

## 2013-01-07 MED ORDER — ONDANSETRON HCL 4 MG PO TABS: 4.0000 mg | ORAL_TABLET | Freq: Four times a day (QID) | ORAL | Status: DC | PRN

## 2013-01-07 MED ORDER — DESMOPRESSIN ACETATE 0.2 MG PO TABS
0.2000 mg | ORAL_TABLET | Freq: Two times a day (BID) | ORAL | Status: DC
  Administered 2013-01-07 – 2013-01-13 (×12): 0.2 mg via ORAL
  Filled 2013-01-07 (×14): qty 1

## 2013-01-07 MED ORDER — ENOXAPARIN SODIUM 60 MG/0.6ML ~~LOC~~ SOLN
60.0000 mg | SUBCUTANEOUS | Status: DC
  Administered 2013-01-08 – 2013-01-13 (×6): 60 mg via SUBCUTANEOUS
  Filled 2013-01-07 (×6): qty 0.6

## 2013-01-07 MED ORDER — CLONIDINE HCL 0.1 MG PO TABS
0.1000 mg | ORAL_TABLET | Freq: Three times a day (TID) | ORAL | Status: DC
  Administered 2013-01-07 – 2013-01-13 (×17): 0.1 mg via ORAL
  Filled 2013-01-07 (×17): qty 1

## 2013-01-07 MED ORDER — AMLODIPINE BESYLATE 5 MG PO TABS
5.0000 mg | ORAL_TABLET | Freq: Every day | ORAL | Status: DC
  Administered 2013-01-08 – 2013-01-13 (×6): 5 mg via ORAL
  Filled 2013-01-07 (×6): qty 1

## 2013-01-07 MED ORDER — ACETAMINOPHEN 650 MG RE SUPP: 650.0000 mg | Freq: Four times a day (QID) | RECTAL | Status: DC | PRN

## 2013-01-07 MED ORDER — SODIUM CHLORIDE 0.9 % IV SOLN: 250.0000 mL | INTRAVENOUS | Status: DC | PRN

## 2013-01-07 MED ORDER — NITROGLYCERIN 2 % TD OINT
1.0000 [in_us] | TOPICAL_OINTMENT | Freq: Once | TRANSDERMAL | Status: AC
  Administered 2013-01-07: 1 [in_us] via TOPICAL
  Filled 2013-01-07: qty 1

## 2013-01-07 MED ORDER — BUDESONIDE-FORMOTEROL FUMARATE 160-4.5 MCG/ACT IN AERO
2.0000 | INHALATION_SPRAY | Freq: Two times a day (BID) | RESPIRATORY_TRACT | Status: DC
  Administered 2013-01-08 – 2013-01-13 (×11): 2 via RESPIRATORY_TRACT
  Filled 2013-01-07: qty 6

## 2013-01-07 MED ORDER — PHENYTOIN SODIUM EXTENDED 100 MG PO CAPS
200.0000 mg | ORAL_CAPSULE | ORAL | Status: DC
  Administered 2013-01-10 – 2013-01-13 (×2): 200 mg via ORAL
  Filled 2013-01-07: qty 2
  Filled 2013-01-07: qty 3
  Filled 2013-01-07: qty 2

## 2013-01-07 MED ORDER — ONDANSETRON HCL 4 MG/2ML IJ SOLN: 4.0000 mg | Freq: Four times a day (QID) | INTRAMUSCULAR | Status: DC | PRN

## 2013-01-07 MED ORDER — FUROSEMIDE 10 MG/ML IJ SOLN: 80.0000 mg | Freq: Two times a day (BID) | INTRAMUSCULAR | Status: DC

## 2013-01-07 MED ORDER — FUROSEMIDE 10 MG/ML IJ SOLN
80.0000 mg | Freq: Once | INTRAMUSCULAR | Status: AC
  Administered 2013-01-07: 80 mg via INTRAVENOUS
  Filled 2013-01-07: qty 4

## 2013-01-07 MED ORDER — DEXAMETHASONE 0.5 MG PO TABS
0.5000 mg | ORAL_TABLET | Freq: Every day | ORAL | Status: DC
  Administered 2013-01-08 – 2013-01-13 (×6): 0.5 mg via ORAL
  Filled 2013-01-07 (×7): qty 1

## 2013-01-07 MED ORDER — ACETAMINOPHEN 325 MG PO TABS
650.0000 mg | ORAL_TABLET | Freq: Four times a day (QID) | ORAL | Status: DC | PRN
  Administered 2013-01-11: 650 mg via ORAL
  Filled 2013-01-07: qty 2

## 2013-01-07 MED ORDER — DESMOPRESSIN ACETATE 0.2 MG PO TABS
ORAL_TABLET | ORAL | Status: AC
  Filled 2013-01-07: qty 1

## 2013-01-07 MED ORDER — AMLODIPINE BESYLATE 5 MG PO TABS: 5.0000 mg | ORAL_TABLET | Freq: Every day | ORAL | Status: DC

## 2013-01-07 MED ORDER — SIMVASTATIN 20 MG PO TABS
20.0000 mg | ORAL_TABLET | Freq: Every day | ORAL | Status: DC
  Administered 2013-01-08 – 2013-01-12 (×5): 20 mg via ORAL
  Filled 2013-01-07 (×5): qty 1

## 2013-01-07 MED ORDER — ALBUTEROL SULFATE (5 MG/ML) 0.5% IN NEBU
2.5000 mg | INHALATION_SOLUTION | Freq: Four times a day (QID) | RESPIRATORY_TRACT | Status: DC | PRN
  Administered 2013-01-08 (×2): 2.5 mg via RESPIRATORY_TRACT
  Filled 2013-01-07 (×2): qty 0.5

## 2013-01-07 MED ORDER — PHENYTOIN SODIUM EXTENDED 100 MG PO CAPS
300.0000 mg | ORAL_CAPSULE | ORAL | Status: DC
Start: 2013-01-08 — End: 2013-01-13
  Administered 2013-01-08 – 2013-01-12 (×4): 300 mg via ORAL
  Filled 2013-01-07 (×2): qty 3
  Filled 2013-01-07: qty 2
  Filled 2013-01-07 (×2): qty 3

## 2013-01-07 MED ORDER — LEVOTHYROXINE SODIUM 50 MCG PO TABS
150.0000 ug | ORAL_TABLET | Freq: Every day | ORAL | Status: DC
  Administered 2013-01-08 – 2013-01-13 (×6): 150 ug via ORAL
  Filled 2013-01-07: qty 1
  Filled 2013-01-07 (×5): qty 3
  Filled 2013-01-07 (×2): qty 1
  Filled 2013-01-07: qty 3

## 2013-01-07 MED ORDER — SODIUM CHLORIDE 0.9 % IJ SOLN: 3.0000 mL | INTRAMUSCULAR | Status: DC | PRN

## 2013-01-07 MED ORDER — FUROSEMIDE 10 MG/ML IJ SOLN
80.0000 mg | Freq: Two times a day (BID) | INTRAMUSCULAR | Status: DC
  Administered 2013-01-08 – 2013-01-13 (×11): 80 mg via INTRAVENOUS
  Filled 2013-01-07: qty 8
  Filled 2013-01-07 (×2): qty 4
  Filled 2013-01-07 (×3): qty 8
  Filled 2013-01-07: qty 4
  Filled 2013-01-07 (×6): qty 8

## 2013-01-07 MED ORDER — ALUM & MAG HYDROXIDE-SIMETH 200-200-20 MG/5ML PO SUSP
30.0000 mL | Freq: Four times a day (QID) | ORAL | Status: DC | PRN
  Administered 2013-01-11 – 2013-01-13 (×4): 30 mL via ORAL
  Filled 2013-01-07 (×4): qty 30

## 2013-01-07 MED ORDER — FUROSEMIDE 10 MG/ML IJ SOLN
INTRAMUSCULAR | Status: AC
  Filled 2013-01-07: qty 4

## 2013-01-07 MED ORDER — TIOTROPIUM BROMIDE MONOHYDRATE 18 MCG IN CAPS
18.0000 ug | ORAL_CAPSULE | Freq: Every day | RESPIRATORY_TRACT | Status: DC
  Administered 2013-01-08 – 2013-01-12 (×5): 18 ug via RESPIRATORY_TRACT
  Filled 2013-01-07: qty 5

## 2013-01-07 MED ORDER — SODIUM CHLORIDE 0.9 % IJ SOLN
3.0000 mL | Freq: Two times a day (BID) | INTRAMUSCULAR | Status: DC
  Administered 2013-01-07 – 2013-01-12 (×8): 3 mL via INTRAVENOUS
  Filled 2013-01-07: qty 3

## 2013-01-07 MED ORDER — ENOXAPARIN SODIUM 30 MG/0.3ML ~~LOC~~ SOLN: 30.0000 mg | SUBCUTANEOUS | Status: DC
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
## 2013-01-07 NOTE — ED Notes (Signed)
Chest pain for 1  Hour, wheeze present..  Pt  On 02 from home.

## 2013-01-07 NOTE — ED Notes (Signed)
Attempted to call report to dorothy

## 2013-01-08 LAB — BASIC METABOLIC PANEL
BUN: 52 mg/dL — ABNORMAL HIGH (ref 6–23)
CO2: 27 mEq/L (ref 19–32)
Chloride: 101 mEq/L (ref 96–112)
Creatinine, Ser: 2.13 mg/dL — ABNORMAL HIGH (ref 0.50–1.35)
GFR calc Af Amer: 35 mL/min — ABNORMAL LOW (ref >90)
Glucose, Bld: 101 mg/dL — ABNORMAL HIGH (ref 70–99)
Potassium: 4.1 mEq/L (ref 3.5–5.1)

## 2013-01-08 LAB — URINALYSIS, ROUTINE W REFLEX MICROSCOPIC
Glucose, UA: NEGATIVE mg/dL
Hgb urine dipstick: NEGATIVE
Ketones, ur: NEGATIVE mg/dL
Protein, ur: NEGATIVE mg/dL
Urobilinogen, UA: 0.2 mg/dL (ref 0.0–1.0)

## 2013-01-08 LAB — MRSA PCR SCREENING: MRSA by PCR: NEGATIVE

## 2013-01-08 MED ORDER — ALBUTEROL SULFATE (5 MG/ML) 0.5% IN NEBU
2.5000 mg | INHALATION_SOLUTION | RESPIRATORY_TRACT | Status: DC
  Administered 2013-01-08 – 2013-01-12 (×23): 2.5 mg via RESPIRATORY_TRACT
  Filled 2013-01-08 (×23): qty 0.5

## 2013-01-08 MED ORDER — CEFTRIAXONE SODIUM 1 G IJ SOLR
1.0000 g | INTRAMUSCULAR | Status: DC
  Administered 2013-01-08 – 2013-01-12 (×5): 1 g via INTRAVENOUS
  Filled 2013-01-08 (×6): qty 10
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
## 2013-01-08 NOTE — Plan of Care (Signed)
Problem: Consults Goal: Heart Failure Patient Education (See Patient Education module for education specifics.) Outcome: Progressing Admitted with SOB and chest pain on admission.    Problem: Phase I Progression Outcomes Goal: Dyspnea controlled at rest (HF) Outcome: Progressing Patient presently on 3liters of oxygen with saturation 96% Goal: Up in chair, BRP Outcome: Progressing Patient states he sleeps in a recliner, up in chair at present Goal: Initial discharge plan identified Outcome: Progressing Home, lives alone

## 2013-01-09 LAB — BASIC METABOLIC PANEL
CO2: 24 mEq/L (ref 19–32)
Chloride: 102 mEq/L (ref 96–112)
GFR calc Af Amer: 38 mL/min — ABNORMAL LOW (ref >90)
Potassium: 4.1 mEq/L (ref 3.5–5.1)
Sodium: 140 mEq/L (ref 135–145)

## 2013-01-09 MED ORDER — HYDROCODONE-HOMATROPINE 5-1.5 MG/5ML PO SYRP
5.0000 mL | ORAL_SOLUTION | ORAL | Status: DC | PRN
  Administered 2013-01-09 – 2013-01-13 (×6): 5 mL via ORAL
  Filled 2013-01-09 (×6): qty 5

## 2013-01-09 MED ORDER — SALINE SPRAY 0.65 % NA SOLN
1.0000 | NASAL | Status: DC | PRN
  Administered 2013-01-09: 1 via NASAL
  Filled 2013-01-09: qty 44

## 2013-01-09 MED ORDER — FLUTICASONE PROPIONATE 50 MCG/ACT NA SUSP
2.0000 | Freq: Every day | NASAL | Status: DC
  Administered 2013-01-09 – 2013-01-13 (×4): 2 via NASAL
  Filled 2013-01-09: qty 16

## 2013-01-09 NOTE — Progress Notes (Signed)
Subjective: He says he feels better. [name] less short of breath. [name] still coughing. He has some sinus drainage.  Objective: Vital signs in last 24 hours: Temp:  [97 F (36.1 C)-98.9 F (37.2 C)] 98.9 F (37.2 C) (02/02 0535) Pulse Rate:  [85-95] 90  (02/02 0535) Resp:  [20-22] 20  (02/02 0535) BP: (119-142)/(63-65) 142/65 mmHg (02/02 0535) SpO2:  [92 %-98 %] 92 % (02/02 0801) Weight change:  Last BM Date: 01/07/13  Intake/Output from previous day: 02/01 0701 - 02/02 0700 In: 600 [P.O.:600] Out: 1450 [Urine:1450]  PHYSICAL EXAM General appearance: alert, cooperative, mild distress and morbidly obese Resp: rhonchi bilaterally Cardio: regular rate and rhythm, S1, S2 normal, no murmur, click, rub or gallop GI: soft, non-tender; bowel sounds normal; no masses,  no organomegaly Extremities: Chronic venous stasis but little edema  Lab Results:    Basic Metabolic Panel:  Basename 01/09/13 0602 01/08/13 0506  NA 140 138  K 4.1 4.1  CL 102 101  CO2 24 27  GLUCOSE 141* 101*  BUN 54* 52*  CREATININE 2.01* 2.13*  CALCIUM 8.7 8.8  MG -- --  PHOS -- --   Liver Function Tests:  [facility] 01/07/13 2002  AST 48*  ALT 51  ALKPHOS 45  BILITOT 0.2*  PROT 6.7  ALBUMIN 3.4*   No results found for this basename: LIPASE:2,AMYLASE:2 in the last 72 hours No results found for this basename: AMMONIA:2 in the last 72 hours CBC:  Basename 01/07/13 2002  WBC 9.5  NEUTROABS --  HGB 12.8*  HCT 40.2  MCV 93.9  PLT 144*   Cardiac Enzymes:  Basename 01/07/13 2002  CKTOTAL --  CKMB --  CKMBINDEX --  TROPONINI <0.30   BNP:  Basename 01/07/13 2002  PROBNP 3996.0*   D-Dimer: No results found for this basename: DDIMER:2 in the last 72 hours CBG: No results found for this basename: GLUCAP:6 in the last 72 hours Hemoglobin A1C: No results found for this basename: HGBA1C in the last 72 hours Fasting Lipid Panel: No results found for this basename:  CHOL,HDL,LDLCALC,TRIG,CHOLHDL,LDLDIRECT in the last 72 hours Thyroid Function Tests: No results found for this basename: TSH,T4TOTAL,FREET4,T3FREE,THYROIDAB in the last 72 hours Anemia Panel: No results found for this basename: VITAMINB12,FOLATE,FERRITIN,TIBC,IRON,RETICCTPCT in the last 72 hours Coagulation: No results found for this basename: LABPROT:2,INR:2 in the last 72 hours Urine Drug Screen: Drugs of Abuse  No results found for this basename: labopia, cocainscrnur, labbenz, amphetmu, thcu, labbarb    Alcohol Level: No results found for this basename: ETH:2 in the last 72 hours Urinalysis:  Basename 01/07/13 2339  COLORURINE YELLOW  LABSPEC <1.005*  PHURINE 6.0  GLUCOSEU NEGATIVE  HGBUR NEGATIVE  BILIRUBINUR NEGATIVE  KETONESUR NEGATIVE  PROTEINUR NEGATIVE  UROBILINOGEN 0.2  NITRITE NEGATIVE  LEUKOCYTESUR NEGATIVE   Misc. Labs:  ABGS No results found for this basename: PHART,PCO2,PO2ART,TCO2,HCO3 in the last 72 hours CULTURES Recent Results (from the past 240 hour(s))  MRSA PCR SCREENING     Status: Normal   Collection Time   01/08/13  4:15 AM      Component Value Range Status Comment   MRSA by PCR NEGATIVE  NEGATIVE Final    Studies/Results: Dg Chest Port 1 View  01/07/2013  *RADIOLOGY REPORT*  Clinical Data: Chest pain  PORTABLE CHEST - 1 VIEW  Comparison: 10/26/2012  Findings: Pulmonary vascular congestion.  Kerley B lines along the right lateral hemithorax, suggesting mild interstitial edema. Prior right pleural effusion has resolved. No pneumothorax.  Cardiomegaly.  IMPRESSION: Cardiomegaly with suspected mild interstitial edema.   Original Report Authenticated By: Charline Bills, M.D.     Medications:  Prior to Admission:  Prescriptions prior to admission  Medication Sig Dispense Refill  . albuterol (PROVENTIL) (2.5 MG/3ML) 0.083% nebulizer solution Take 3 mLs (2.5 mg total) by nebulization every 6 (six) hours as needed for wheezing.  75 mL  12  .  amLODipine (NORVASC) 5 MG tablet Take 1 tablet (5 mg total) by mouth daily.  30 tablet  6  . budesonide-formoterol (SYMBICORT) 160-4.5 MCG/ACT inhaler Inhale 2 puffs into the lungs 2 (two) times daily.      . cloNIDine (CATAPRES) 0.1 MG tablet Take 0.1 mg by mouth 3 (three) times daily.      Marland Kitchen dexamethasone (DECADRON) 0.5 MG tablet Take 0.5 mg by mouth daily.      . furosemide (LASIX) 40 MG tablet Take 1.5 tablets (60 mg total) by mouth 2 (two) times daily.  45 tablet  3  . levothyroxine (SYNTHROID, LEVOTHROID) 150 MCG tablet Take 150 mcg by mouth daily.      . phenytoin (DILANTIN) 100 MG ER capsule Take 200-300 mg by mouth daily. Takes 200 mg on Monday and Thursdsay. On all other days of the week takes 300 mg.      . simvastatin (ZOCOR) 80 MG tablet Take 80 mg by mouth daily.       Marland Kitchen tiotropium (SPIRIVA) 18 MCG inhalation capsule Place 18 mcg into inhaler and inhale daily.      Marland Kitchen torsemide (DEMADEX) 100 MG tablet Take 50 mg by mouth as directed. 0.5  Tablet daily. Weigh yourself daily and if your weight goes up three pounds take another 0.5 tab      . desmopressin (DDAVP) 0.2 MG tablet Take 0.2 mg by mouth 2 (two) times daily.       Scheduled:   . albuterol  2.5 mg Nebulization Q4H  . amLODipine  5 mg Oral Daily  . budesonide-formoterol  2 puff Inhalation BID  . cefTRIAXone (ROCEPHIN)  IV  1 g Intravenous Q24H  . cloNIDine  0.1 mg Oral TID  . desmopressin  0.2 mg Oral BID  . dexamethasone  0.5 mg Oral Daily  . enoxaparin (LOVENOX) injection  60 mg Subcutaneous Q24H  . fluticasone  2 spray Each Nare Daily  . furosemide  80 mg Intravenous BID  . levothyroxine  150 mcg Oral QAC breakfast  . phenytoin  200 mg Oral Custom  . phenytoin  300 mg Oral Custom  . simvastatin  20 mg Oral q1800  . sodium chloride  3 mL Intravenous Q12H  . tiotropium  18 mcg Inhalation Daily   Continuous:  [id number] chloride, acetaminophen, acetaminophen, alum & mag hydroxide-simeth, ondansetron (ZOFRAN) IV,  ondansetron, sodium chloride, sodium chloride  Assesment: He was admitted with CHF. He probably has some element of bronchitis as well. [name] improving. He has COPD at baseline. He has chronic renal failure. That appears to be fairly stable. [name] complaining of sinus congestion. He has panhypopituitarism following pituitary excision Active Problems:  * No active hospital problems. *     Plan: I added nasal steroid and saline nasal spray. He has improved and may be ready for discharge fairly sudden    LOS: 2 days   Tarris Delbene L 01/09/2013, 8:08 AM

## 2013-01-10 ENCOUNTER — Ambulatory Visit: Payer: Medicare Other | Admitting: Adult Health

## 2013-01-10 NOTE — Progress Notes (Signed)
UR Chart Review Completed  

## 2013-01-10 NOTE — Progress Notes (Signed)
Subjective: Patient was admitted as cas of CHF and being treated with Iv diuretics. His Echo that was done in November 2013 showed diastolic dysfunction. His Ef was about 50-55%.  Objective: Vital signs in last 24 hours: Temp:  [98 F (36.7 C)-98.3 F (36.8 C)] 98.3 F (36.8 C) (02/03 0506) Pulse Rate:  [88-91] 88  (02/03 0506) Resp:  [20] 20  (02/03 0506) BP: (118-134)/(48-60) 124/48 mmHg (02/03 0506) SpO2:  [92 %-100 %] 93 % (02/03 0709) FiO2 (%):  [21 %] 21 % (02/02 1125) Weight:  [125.238 kg (276 lb 1.6 oz)] 125.238 kg (276 lb 1.6 oz) (02/03 0640) Weight change:  Last BM Date: 01/07/13  Intake/Output from previous day: 02/02 0701 - 02/03 0700 In: 1026 [P.O.:960; IV Piggyback:66] Out: 3025 [Urine:3025]  PHYSICAL EXAM General appearance: alert and no distress Resp: diminished breath sounds bilaterally Cardio: S1, S2 normal GI: soft, non-tender; bowel sounds normal; no masses,  no organomegaly Extremities: 2++ leg edema  Lab Results:    @labtest @ [facility] No results found for this basename: PHART,PCO2,PO2ART,TCO2,HCO3 in the last 72 hours CULTURES Recent Results (from the past 240 hour(s))  MRSA PCR SCREENING     Status: Normal   Collection Time   01/08/13  4:15 AM      Component Value Range Status Comment   MRSA by PCR NEGATIVE  NEGATIVE Final    Studies/Results: No results found.  Medications: I have reviewed the patient's current medications.  Assesment: 1. Acute on chronic Diastolic CHF 2.COPD with exacerbation 3. Sleep Apnea syndrome 4. Hypertension 5. Morbid Obesity Active Problems:  * No active hospital problems. *     Plan: -continue I/O diuretics Current treatment Cbc/bmp in am.    LOS: 3 days   [name] [name] 01/10/2013, 8:06 AM

## 2013-01-10 NOTE — Care Management Note (Signed)
    Page 1 of 2   01/13/2013     8:57:33 AM   CARE MANAGEMENT NOTE 01/13/2013  Patient:  [name] [name], [name] [name]   Account Number:  [id number]  Date Initiated:  01/10/2013  Documentation initiated by:  Sharrie Rothman  Subjective/Objective Assessment:   Pt admitted from home with CHF. Pt lives alone but has a daughter who is very active in the care of the pt. Pt will return home at discharge. Pt has a walker and O2 with [employer]. Pt has used [facility] in the past.     Action/Plan:   Pt has requested at [facility] with [facility] prior to discharge. Also pt will benefit from [facility] again at discharge.   Anticipated DC Date:  01/13/2013   Anticipated DC Plan:  HOME W HOME HEALTH SERVICES      DC Planning Services  CM consult      [facility] Choice  DURABLE MEDICAL EQUIPMENT   Choice offered to / List presented to:  C-1 Patient   DME arranged  BEDSIDE COMMODE      DME agency  [facility]        Status of service:  Completed, signed off Medicare Important Message given?  YES (If response is "NO", the following Medicare IM given date fields will be blank) Date Medicare IM given:  01/13/2013 Date Additional Medicare IM given:    Discharge Disposition:  HOME/SELF CARE  Per UR Regulation:    If discussed at Long Length of Stay Meetings, dates discussed:    Comments:  01/13/13 0900 Arlyss Queen, RN BSN CM Pt discharged home today. BSC will be delivered to pts home this afternoon by [facility]. No HH needs at this time.  01/12/13 1200 Arlyss Queen, RN BSN CM Pt order for [facility] sent to [facility] for delivery tomorrow to pts home. Pt is aware of amount of payment and stated that his daughter will go by [facility] today to pay for [facility].  01/10/13 1505 Arlyss Queen, RN BSN CM

## 2013-01-11 ENCOUNTER — Inpatient Hospital Stay (HOSPITAL_COMMUNITY): Payer: Medicare Other

## 2013-01-11 LAB — BASIC METABOLIC PANEL
BUN: 48 mg/dL — ABNORMAL HIGH (ref 6–23)
CO2: 28 mEq/L (ref 19–32)
Chloride: 101 mEq/L (ref 96–112)
GFR calc Af Amer: 33 mL/min — ABNORMAL LOW (ref >90)
Glucose, Bld: 91 mg/dL (ref 70–99)
Potassium: 4.3 mEq/L (ref 3.5–5.1)

## 2013-01-11 LAB — CBC
HCT: 39.6 % (ref 39.0–52.0)
Hemoglobin: 12.8 g/dL — ABNORMAL LOW (ref 13.0–17.0)
RBC: 4.29 MIL/uL (ref 4.22–5.81)
RDW: 15.8 % — ABNORMAL HIGH (ref 11.5–15.5)
WBC: 7.4 10*3/uL (ref 4.0–10.5)

## 2013-01-11 NOTE — Plan of Care (Signed)
Problem: Phase III Progression Outcomes Goal: Activity at appropriate level-compared to baseline (UP IN CHAIR FOR HEMODIALYSIS)  Outcome: Progressing Out of bed to chair.     

## 2013-01-11 NOTE — Progress Notes (Signed)
Subjective: Patient continue to have short of breath when he lie down. [name] also wheezing and coughing. No nausea or vomiting.   Objective: Vital signs in last 24 hours: Temp:  [98 F (36.7 C)-98.5 F (36.9 C)] 98.5 F (36.9 C) (02/04 0438) Pulse Rate:  [79-91] 89  (02/04 0438) Resp:  [20] 20  (02/04 0438) BP: (118-137)/(39-65) 126/39 mmHg (02/04 0438) SpO2:  [95 %-100 %] 99 % (02/04 0735) Weight:  [122.2 kg (269 lb 6.4 oz)-122.244 kg (269 lb 8 oz)] 122.244 kg (269 lb 8 oz) (02/04 0616) Weight change: -3.038 kg (-6 lb 11.2 oz) Last BM Date: 01/09/13  Intake/Output from previous day: 02/03 0701 - 02/04 0700 In: 840 [P.O.:840] Out: 3350 [Urine:3350]  PHYSICAL EXAM General appearance: alert and no distress Resp: diminished breath sounds bilaterally Cardio: S1, S2 normal GI: soft, non-tender; bowel sounds normal; no masses,  no organomegaly Extremities: 2++ leg edema  Lab Results:    @labtest @ [facility] No results found for this basename: PHART,PCO2,PO2ART,TCO2,HCO3 in the last 72 hours CULTURES Recent Results (from the past 240 hour(s))  MRSA PCR SCREENING     Status: Normal   Collection Time   01/08/13  4:15 AM      Component Value Range Status Comment   MRSA by PCR NEGATIVE  NEGATIVE Final    Studies/Results: No results found.  Medications: I have reviewed the patient's current medications.  Assesment: 1. Acute on chronic Diastolic CHF 2.COPD with exacerbation 3. Sleep Apnea syndrome 4. Hypertension 5. Morbid Obesity Active Problems:  * No active hospital problems. *     Plan: continue IV diuretics Repeat chest x-ray Current treatment Cbc/bmp in am.    LOS: 4 days   Eldredge Veldhuizen 01/11/2013, 7:59 AM

## 2013-01-12 ENCOUNTER — Ambulatory Visit: Payer: Medicare Other | Admitting: Cardiology

## 2013-01-12 LAB — BASIC METABOLIC PANEL
CO2: 31 mEq/L (ref 19–32)
Chloride: 100 mEq/L (ref 96–112)
Creatinine, Ser: 2.4 mg/dL — ABNORMAL HIGH (ref 0.50–1.35)
Glucose, Bld: 119 mg/dL — ABNORMAL HIGH (ref 70–99)

## 2013-01-12 LAB — CBC
HCT: 39.1 % (ref 39.0–52.0)
MCV: 92.2 fL (ref 78.0–100.0)
RDW: 15.5 % (ref 11.5–15.5)
WBC: 7 10*3/uL (ref 4.0–10.5)

## 2013-01-12 MED ORDER — ALBUTEROL SULFATE (5 MG/ML) 0.5% IN NEBU: 2.5000 mg | INHALATION_SOLUTION | RESPIRATORY_TRACT | Status: DC | PRN

## 2013-01-12 MED ORDER — ALBUTEROL SULFATE (5 MG/ML) 0.5% IN NEBU
INHALATION_SOLUTION | RESPIRATORY_TRACT | Status: AC
  Administered 2013-01-12: 2.5 mg via RESPIRATORY_TRACT
  Filled 2013-01-12: qty 0.5

## 2013-01-12 MED ORDER — PANTOPRAZOLE SODIUM 40 MG PO TBEC
40.0000 mg | DELAYED_RELEASE_TABLET | Freq: Every day | ORAL | Status: DC
  Administered 2013-01-12 – 2013-01-13 (×2): 40 mg via ORAL
  Filled 2013-01-12 (×2): qty 1

## 2013-01-12 MED ORDER — ALBUTEROL SULFATE (5 MG/ML) 0.5% IN NEBU
2.5000 mg | INHALATION_SOLUTION | Freq: Four times a day (QID) | RESPIRATORY_TRACT | Status: DC
  Administered 2013-01-12 – 2013-01-13 (×4): 2.5 mg via RESPIRATORY_TRACT
  Filled 2013-01-12 (×3): qty 0.5

## 2013-01-12 NOTE — Progress Notes (Signed)
Subjective: Patient complains of indigestion. No nausea or vomiting. His breathing is better.  Objective: Vital signs in last 24 hours: Temp:  [98.1 F (36.7 C)-98.2 F (36.8 C)] 98.1 F (36.7 C) (02/05 0500) Pulse Rate:  [77-88] 84  (02/05 0500) Resp:  [20-22] 22  (02/05 0500) BP: (130-156)/(50-71) 156/71 mmHg (02/05 0500) SpO2:  [95 %-100 %] 99 % (02/05 0500) Weight:  [124.331 kg (274 lb 1.6 oz)] 124.331 kg (274 lb 1.6 oz) (02/05 0700) Weight change: 2.131 kg (4 lb 11.2 oz) Last BM Date: 01/11/13  Intake/Output from previous day: 02/04 0701 - 02/05 0700 In: 940 [P.O.:940] Out: 2700 [Urine:2700]  PHYSICAL EXAM General appearance: alert and no distress Resp: diminished breath sounds bilaterally Cardio: S1, S2 normal GI: soft, non-tender; bowel sounds normal; no masses,  no organomegaly Extremities: 2++ leg edema  Lab Results:    @labtest @ [facility] No results found for this basename: PHART,PCO2,PO2ART,TCO2,HCO3 in the last 72 hours CULTURES Recent Results (from the past 240 hour(s))  MRSA PCR SCREENING     Status: Normal   Collection Time   01/08/13  4:15 AM      Component Value Range Status Comment   MRSA by PCR NEGATIVE  NEGATIVE Final    Studies/Results: Dg Chest 2 View  01/11/2013  *RADIOLOGY REPORT*  Clinical Data: CHF, shortness of breath, follow up  CHEST - 2 VIEW  Comparison: 01/07/2013  Findings: Enlargement of cardiac silhouette with pulmonary vascular congestion. Tortuous aorta. Question underlying emphysematous and bronchitic changes. Slight accentuation of perihilar markings particularly on right could reflect minimal edema. Remaining lungs clear. Slight prominence of right hilum appears unchanged. No acute osseous findings.  IMPRESSION: Enlargement of cardiac silhouette with pulmonary vascular congestion. Cannot exclude minimal perihilar edema.   Original Report Authenticated By: Ulyses Southward, M.D.     Medications: I have reviewed the patient's current  medications.  Assesment: 1. Acute on chronic Diastolic CHF 2.COPD with exacerbation 3. Sleep Apnea syndrome 4. Hypertension 5. Morbid Obesity Active Problems:  * No active hospital problems. *     Plan: continue IV diuretics Start protonix 40 mg po daily Current treatment Cbc/bmp in am.    LOS: 5 days   Ramsey Midgett 01/12/2013, 7:47 AM

## 2013-01-13 LAB — BASIC METABOLIC PANEL
BUN: 46 mg/dL — ABNORMAL HIGH (ref 6–23)
CO2: 28 mEq/L (ref 19–32)
Chloride: 99 mEq/L (ref 96–112)
Creatinine, Ser: 2.27 mg/dL — ABNORMAL HIGH (ref 0.50–1.35)
GFR calc Af Amer: 32 mL/min — ABNORMAL LOW (ref >90)

## 2013-01-13 NOTE — Discharge Summary (Signed)
Physician Discharge Summary  Patient ID: [name] [name] MRN: [id number] DOB/AGE: 1946/11/17 67 y.o. Primary Care Physician:Stephonie Wilcoxen, MD Admit date: 01/07/2013 Discharge date: 01/13/2013    Discharge Diagnoses:  1. Acute on chronic Diastolic CHF  2.COPD with exacerbation  3. Sleep Apnea syndrome  4. Hypertension  5. Morbid Obesity   Active Problems:  * No active hospital problems. *      Medication List     As of 01/13/2013  8:19 AM    TAKE these medications         albuterol (2.5 MG/3ML) 0.083% nebulizer solution   Commonly known as: PROVENTIL   Take 3 mLs (2.5 mg total) by nebulization every 6 (six) hours as needed for wheezing.      amLODipine 5 MG tablet   Commonly known as: NORVASC   Take 1 tablet (5 mg total) by mouth daily.      budesonide-formoterol 160-4.5 MCG/ACT inhaler   Commonly known as: SYMBICORT   Inhale 2 puffs into the lungs 2 (two) times daily.      cloNIDine 0.1 MG tablet   Commonly known as: CATAPRES   Take 0.1 mg by mouth 3 (three) times daily.      desmopressin 0.2 MG tablet   Commonly known as: DDAVP   Take 0.2 mg by mouth 2 (two) times daily.      dexamethasone 0.5 MG tablet   Commonly known as: DECADRON   Take 0.5 mg by mouth daily.      furosemide 40 MG tablet   Commonly known as: LASIX   Take 1.5 tablets (60 mg total) by mouth 2 (two) times daily.      levothyroxine 150 MCG tablet   Commonly known as: SYNTHROID, LEVOTHROID   Take 150 mcg by mouth daily.      phenytoin 100 MG ER capsule   Commonly known as: DILANTIN   Take 200-300 mg by mouth daily. Takes 200 mg on Monday and Thursdsay. On all other days of the week takes 300 mg.      simvastatin 80 MG tablet   Commonly known as: ZOCOR   Take 80 mg by mouth daily.      tiotropium 18 MCG inhalation capsule   Commonly known as: SPIRIVA   Place 18 mcg into inhaler and inhale daily.      torsemide 100 MG tablet   Commonly known as: DEMADEX   Take 50 mg by mouth as  directed. 0.5  Tablet daily. Weigh yourself daily and if your weight goes up three pounds take another 0.5 tab        Discharged Condition:* improved   Consults:* none Significant Diagnostic Studies: Dg Chest 2 View  01/11/2013  *RADIOLOGY REPORT*  Clinical Data: CHF, shortness of breath, follow up  CHEST - 2 VIEW  Comparison: 01/07/2013  Findings: Enlargement of cardiac silhouette with pulmonary vascular congestion. Tortuous aorta. Question underlying emphysematous and bronchitic changes. Slight accentuation of perihilar markings particularly on right could reflect minimal edema. Remaining lungs clear. Slight prominence of right hilum appears unchanged. No acute osseous findings.  IMPRESSION: Enlargement of cardiac silhouette with pulmonary vascular congestion. Cannot exclude minimal perihilar edema.   Original Report Authenticated By: Ulyses Southward, M.D.    Dg Chest Port 1 View  01/07/2013  *RADIOLOGY REPORT*  Clinical Data: Chest pain  PORTABLE CHEST - 1 VIEW  Comparison: 10/26/2012  Findings: Pulmonary vascular congestion.  Kerley B lines along the right lateral hemithorax, suggesting mild interstitial edema. Prior right pleural effusion  has resolved. No pneumothorax.  Cardiomegaly.  IMPRESSION: Cardiomegaly with suspected mild interstitial edema.   Original Report Authenticated By: Charline Bills, M.D.     Lab Results: Basic Metabolic Panel:  Basename 01/13/13 0521 01/12/13 0551  NA 136 138  K 4.1 4.6  CL 99 100  CO2 28 31  GLUCOSE 96 119*  BUN 46* 50*  CREATININE 2.27* 2.40*  CALCIUM 8.7 9.0  MG -- --  PHOS -- --   Liver Function Tests: No results found for this basename: AST:2,ALT:2,ALKPHOS:2,BILITOT:2,PROT:2,ALBUMIN:2 in the last 72 hours   CBC:  Basename 01/12/13 0551 01/11/13 0536  WBC 7.0 7.4  NEUTROABS -- --  HGB 12.6* 12.8*  HCT 39.1 39.6  MCV 92.2 92.3  PLT 146* 159    Recent Results (from the past 240 hour(s))  MRSA PCR SCREENING     Status: Normal    Collection Time   01/08/13  4:15 AM      Component Value Range Status Comment   MRSA by PCR NEGATIVE  NEGATIVE Final      Hospital Course: * This is a 67 years old male patient with history of multiple medical illnesses was admitted due top shortness of breath. His chest x-ray showed pulmonary vascular congestion. His echo done in NOV. 2013 showed grade II diastolic dysfunction. His Ef was around 50%. Patient was diuresed with IV diuretics and improved. He will discharged on oral diuretics to be followed in the office in 1 week.  Discharge Exam: Blood pressure 101/57, pulse 98, temperature 98.7 F (37.1 C), temperature source Oral, resp. rate 20, height 6\' 1"  (1.854 m), weight 124.331 kg (274 lb 1.6 oz), SpO2 98.00%. *  Disposition: * home     Signed: Leydi Winstead   01/13/2013, 8:19 AM

## 2013-01-13 NOTE — Progress Notes (Signed)
Patient with orders to be discharge home. Discharge instructions given, verbalized understanding via teach back method. Patient in stable condition upon discharge. Patient left with family in private vehicle.

## 2013-02-15 ENCOUNTER — Encounter: Payer: Self-pay | Admitting: Adult Health

## 2013-02-15 ENCOUNTER — Ambulatory Visit (INDEPENDENT_AMBULATORY_CARE_PROVIDER_SITE_OTHER): Payer: Medicare Other | Admitting: Adult Health

## 2013-02-15 VITALS — BP 150/68 | HR 91 | Ht 73.0 in | Wt 283.0 lb

## 2013-02-15 NOTE — Assessment & Plan Note (Signed)
[name] asymptomatic at this time. I have reviewed his echo which was completed in November of 2003. It only showed moderate stenosis at that time. We will repeat his echo in November of this year unless he becomes symptomatic. I explained this to the patient showed him the echo report he verbalizes understanding.

## 2013-02-15 NOTE — Assessment & Plan Note (Signed)
He has gained 8 pounds since last visit. We have talked about low-salt diet, and he verbalizes understanding. I reviewed with him about his weight and would like to see him at about 10 pounds lower than he is today. There is no overt evidence of CHF although he does have some mild pretibial edema. He has not taken his Lasix yet today. We will continue him on his current medication regimen. He knows to call us if he continues to gain some weight. [name] weighing daily and I have given parameters of a 2-3 pound weight gain or 5 pounds in one week. If this occurs he is to call his for medication adjustments temporarily.

## 2013-02-15 NOTE — Assessment & Plan Note (Signed)
Blood pressure is adequately controlled on this visit. Prior blood pressures to show lower readings in the 130s 140s systolic. He has not taken his Lasix today which may be the reason his blood pressure is mildly elevated. I will continue to monitor this. He will see [place] again in 6 months unless he becomes symptomatic.

## 2013-02-15 NOTE — Patient Instructions (Addendum)
Continue same medication.  Your physician wants you to follow-up in: 6 months.  You will receive a reminder letter in the mail two months in advance. If you don't receive a letter, please call our office to schedule the follow-up appointment.  
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
## 2013-03-19 ENCOUNTER — Encounter (HOSPITAL_COMMUNITY): Payer: Self-pay | Admitting: Emergency Medicine

## 2013-03-19 ENCOUNTER — Emergency Department (HOSPITAL_COMMUNITY): Payer: Medicare Other

## 2013-03-19 ENCOUNTER — Other Ambulatory Visit: Payer: Self-pay

## 2013-03-19 ENCOUNTER — Inpatient Hospital Stay (HOSPITAL_COMMUNITY)
Admission: EM | Admit: 2013-03-19 | Discharge: 2013-03-27 | DRG: 292 | Disposition: A | Discharge status: Home Health | Payer: Medicare Other | Attending: Internal Medicine | Admitting: Internal Medicine

## 2013-03-19 DIAGNOSIS — I359 Nonrheumatic aortic valve disorder, unspecified: Secondary | ICD-10-CM | POA: Diagnosis present

## 2013-03-19 DIAGNOSIS — N183 Chronic kidney disease, stage 3 unspecified: Secondary | ICD-10-CM | POA: Diagnosis present

## 2013-03-19 DIAGNOSIS — Z79899 Other long term (current) drug therapy: Secondary | ICD-10-CM

## 2013-03-19 DIAGNOSIS — Z6841 Body Mass Index (BMI) 40.0 and over, adult: Secondary | ICD-10-CM

## 2013-03-19 DIAGNOSIS — Z9981 Dependence on supplemental oxygen: Secondary | ICD-10-CM

## 2013-03-19 DIAGNOSIS — N2581 Secondary hyperparathyroidism of renal origin: Secondary | ICD-10-CM | POA: Diagnosis present

## 2013-03-19 DIAGNOSIS — N179 Acute kidney failure, unspecified: Secondary | ICD-10-CM | POA: Diagnosis present

## 2013-03-19 DIAGNOSIS — Z87891 Personal history of nicotine dependence: Secondary | ICD-10-CM

## 2013-03-19 DIAGNOSIS — E876 Hypokalemia: Secondary | ICD-10-CM | POA: Diagnosis present

## 2013-03-19 DIAGNOSIS — R142 Eructation: Secondary | ICD-10-CM | POA: Diagnosis present

## 2013-03-19 DIAGNOSIS — E893 Postprocedural hypopituitarism: Secondary | ICD-10-CM | POA: Diagnosis present

## 2013-03-19 DIAGNOSIS — E039 Hypothyroidism, unspecified: Secondary | ICD-10-CM | POA: Diagnosis present

## 2013-03-19 DIAGNOSIS — J441 Chronic obstructive pulmonary disease with (acute) exacerbation: Secondary | ICD-10-CM | POA: Diagnosis present

## 2013-03-19 DIAGNOSIS — I10 Essential (primary) hypertension: Secondary | ICD-10-CM | POA: Diagnosis present

## 2013-03-19 DIAGNOSIS — Y836 Removal of other organ (partial) (total) as the cause of abnormal reaction of the patient, or of later complication, without mention of misadventure at the time of the procedure: Secondary | ICD-10-CM | POA: Diagnosis present

## 2013-03-19 DIAGNOSIS — Z905 Acquired absence of kidney: Secondary | ICD-10-CM

## 2013-03-19 DIAGNOSIS — I5033 Acute on chronic diastolic (congestive) heart failure: Principal | ICD-10-CM | POA: Diagnosis present

## 2013-03-19 DIAGNOSIS — Z8249 Family history of ischemic heart disease and other diseases of the circulatory system: Secondary | ICD-10-CM

## 2013-03-19 DIAGNOSIS — J4489 Other specified chronic obstructive pulmonary disease: Secondary | ICD-10-CM | POA: Diagnosis present

## 2013-03-19 DIAGNOSIS — G40909 Epilepsy, unspecified, not intractable, without status epilepticus: Secondary | ICD-10-CM | POA: Diagnosis present

## 2013-03-19 DIAGNOSIS — E236 Other disorders of pituitary gland: Secondary | ICD-10-CM | POA: Diagnosis present

## 2013-03-19 DIAGNOSIS — E119 Type 2 diabetes mellitus without complications: Secondary | ICD-10-CM | POA: Diagnosis present

## 2013-03-19 DIAGNOSIS — Z809 Family history of malignant neoplasm, unspecified: Secondary | ICD-10-CM

## 2013-03-19 DIAGNOSIS — I200 Unstable angina: Secondary | ICD-10-CM | POA: Diagnosis present

## 2013-03-19 DIAGNOSIS — E559 Vitamin D deficiency, unspecified: Secondary | ICD-10-CM | POA: Diagnosis present

## 2013-03-19 DIAGNOSIS — N189 Chronic kidney disease, unspecified: Secondary | ICD-10-CM

## 2013-03-19 DIAGNOSIS — E785 Hyperlipidemia, unspecified: Secondary | ICD-10-CM

## 2013-03-19 DIAGNOSIS — I872 Venous insufficiency (chronic) (peripheral): Secondary | ICD-10-CM | POA: Diagnosis present

## 2013-03-19 DIAGNOSIS — E230 Hypopituitarism: Secondary | ICD-10-CM | POA: Diagnosis present

## 2013-03-19 DIAGNOSIS — I509 Heart failure, unspecified: Secondary | ICD-10-CM

## 2013-03-19 DIAGNOSIS — I130 Hypertensive heart and chronic kidney disease with heart failure and stage 1 through stage 4 chronic kidney disease, or unspecified chronic kidney disease: Secondary | ICD-10-CM | POA: Diagnosis present

## 2013-03-19 DIAGNOSIS — G4730 Sleep apnea, unspecified: Secondary | ICD-10-CM | POA: Diagnosis present

## 2013-03-19 DIAGNOSIS — R141 Gas pain: Secondary | ICD-10-CM | POA: Diagnosis present

## 2013-03-19 DIAGNOSIS — J449 Chronic obstructive pulmonary disease, unspecified: Secondary | ICD-10-CM | POA: Diagnosis present

## 2013-03-19 LAB — COMPREHENSIVE METABOLIC PANEL
ALT: 19 U/L (ref 0–53)
AST: 26 U/L (ref 0–37)
Alkaline Phosphatase: 46 U/L (ref 39–117)
CO2: 28 mEq/L (ref 19–32)
Calcium: 9 mg/dL (ref 8.4–10.5)
Potassium: 3 mEq/L — ABNORMAL LOW (ref 3.5–5.1)
Sodium: 138 mEq/L (ref 135–145)
Total Protein: 7.5 g/dL (ref 6.0–8.3)

## 2013-03-19 LAB — HEPARIN LEVEL (UNFRACTIONATED): Heparin Unfractionated: 0.3 IU/mL (ref 0.30–0.70)

## 2013-03-19 LAB — TROPONIN I: Troponin I: <0.3 ng/mL (ref <0.30)

## 2013-03-19 LAB — CBC
MCH: 29.2 pg (ref 26.0–34.0)
MCHC: 32.7 g/dL (ref 30.0–36.0)
Platelets: 151 10*3/uL (ref 150–400)
RBC: 3.83 MIL/uL — ABNORMAL LOW (ref 4.22–5.81)

## 2013-03-19 LAB — MRSA PCR SCREENING: MRSA by PCR: POSITIVE — AB

## 2013-03-19 MED ORDER — DESMOPRESSIN ACETATE 0.2 MG PO TABS
ORAL_TABLET | ORAL | Status: AC
  Filled 2013-03-19: qty 1

## 2013-03-19 MED ORDER — LINAGLIPTIN 5 MG PO TABS
5.0000 mg | ORAL_TABLET | Freq: Every day | ORAL | Status: DC
  Administered 2013-03-20 – 2013-03-27 (×8): 5 mg via ORAL
  Filled 2013-03-19 (×8): qty 1

## 2013-03-19 MED ORDER — ASPIRIN 325 MG PO TABS
325.0000 mg | ORAL_TABLET | Freq: Once | ORAL | Status: AC
  Administered 2013-03-19: 325 mg via ORAL
  Filled 2013-03-19: qty 1

## 2013-03-19 MED ORDER — ATORVASTATIN CALCIUM 40 MG PO TABS
40.0000 mg | ORAL_TABLET | Freq: Every day | ORAL | Status: DC
  Administered 2013-03-20 – 2013-03-26 (×7): 40 mg via ORAL
  Filled 2013-03-19 (×7): qty 1

## 2013-03-19 MED ORDER — CHLORHEXIDINE GLUCONATE CLOTH 2 % EX PADS
6.0000 | MEDICATED_PAD | Freq: Every day | CUTANEOUS | Status: AC
  Administered 2013-03-20 – 2013-03-23 (×4): 6 via TOPICAL

## 2013-03-19 MED ORDER — DEXAMETHASONE 0.5 MG PO TABS
0.5000 mg | ORAL_TABLET | Freq: Every day | ORAL | Status: DC
  Administered 2013-03-20 – 2013-03-27 (×8): 0.5 mg via ORAL
  Filled 2013-03-19 (×10): qty 1

## 2013-03-19 MED ORDER — ALBUTEROL SULFATE (5 MG/ML) 0.5% IN NEBU
2.5000 mg | INHALATION_SOLUTION | Freq: Four times a day (QID) | RESPIRATORY_TRACT | Status: DC | PRN
  Administered 2013-03-20 – 2013-03-21 (×3): 2.5 mg via RESPIRATORY_TRACT
  Filled 2013-03-19 (×3): qty 0.5

## 2013-03-19 MED ORDER — ALBUTEROL SULFATE (5 MG/ML) 0.5% IN NEBU
INHALATION_SOLUTION | RESPIRATORY_TRACT | Status: AC
  Administered 2013-03-19: 2.5 mg
  Filled 2013-03-19: qty 0.5

## 2013-03-19 MED ORDER — LEVOTHYROXINE SODIUM 75 MCG PO TABS
150.0000 ug | ORAL_TABLET | Freq: Every day | ORAL | Status: DC
  Administered 2013-03-19 – 2013-03-27 (×9): 150 ug via ORAL
  Filled 2013-03-19 (×9): qty 2

## 2013-03-19 MED ORDER — PHENYTOIN SODIUM EXTENDED 100 MG PO CAPS
200.0000 mg | ORAL_CAPSULE | Freq: Every day | ORAL | Status: DC
  Administered 2013-03-19: 300 mg via ORAL
  Filled 2013-03-19: qty 3

## 2013-03-19 MED ORDER — BUDESONIDE-FORMOTEROL FUMARATE 160-4.5 MCG/ACT IN AERO
INHALATION_SPRAY | RESPIRATORY_TRACT | Status: AC
  Filled 2013-03-19: qty 6

## 2013-03-19 MED ORDER — TIOTROPIUM BROMIDE MONOHYDRATE 18 MCG IN CAPS
ORAL_CAPSULE | RESPIRATORY_TRACT | Status: AC
  Filled 2013-03-19: qty 5

## 2013-03-19 MED ORDER — HEPARIN (PORCINE) IN NACL 100-0.45 UNIT/ML-% IJ SOLN
1750.0000 [IU]/h | INTRAMUSCULAR | Status: DC
  Administered 2013-03-19: 1300 [IU]/h via INTRAVENOUS
  Administered 2013-03-20: 1600 [IU]/h via INTRAVENOUS
  Administered 2013-03-20: 1300 [IU]/h via INTRAVENOUS
  Administered 2013-03-21: 1750 [IU]/h via INTRAVENOUS
  Filled 2013-03-19 (×4): qty 250

## 2013-03-19 MED ORDER — FUROSEMIDE 10 MG/ML IJ SOLN
40.0000 mg | Freq: Two times a day (BID) | INTRAMUSCULAR | Status: DC
  Administered 2013-03-19 – 2013-03-21 (×4): 40 mg via INTRAVENOUS
  Filled 2013-03-19 (×5): qty 4

## 2013-03-19 MED ORDER — BUDESONIDE-FORMOTEROL FUMARATE 160-4.5 MCG/ACT IN AERO
2.0000 | INHALATION_SPRAY | Freq: Two times a day (BID) | RESPIRATORY_TRACT | Status: DC
  Administered 2013-03-19 – 2013-03-27 (×16): 2 via RESPIRATORY_TRACT
  Filled 2013-03-19: qty 6

## 2013-03-19 MED ORDER — HEPARIN BOLUS VIA INFUSION
4000.0000 [IU] | Freq: Once | INTRAVENOUS | Status: AC
  Administered 2013-03-19: 4000 [IU] via INTRAVENOUS

## 2013-03-19 MED ORDER — DESMOPRESSIN ACETATE 0.2 MG PO TABS
0.2000 mg | ORAL_TABLET | Freq: Two times a day (BID) | ORAL | Status: DC
  Administered 2013-03-19 – 2013-03-27 (×16): 0.2 mg via ORAL
  Filled 2013-03-19 (×18): qty 1

## 2013-03-19 MED ORDER — TIOTROPIUM BROMIDE MONOHYDRATE 18 MCG IN CAPS
18.0000 ug | ORAL_CAPSULE | Freq: Every day | RESPIRATORY_TRACT | Status: DC
Start: 2013-03-19 — End: 2013-03-27
  Administered 2013-03-19 – 2013-03-27 (×9): 18 ug via RESPIRATORY_TRACT
  Filled 2013-03-19 (×2): qty 5

## 2013-03-19 MED ORDER — NITROGLYCERIN IN D5W 200-5 MCG/ML-% IV SOLN
5.0000 ug/min | Freq: Once | INTRAVENOUS | Status: AC
  Administered 2013-03-19: 5 ug/min via INTRAVENOUS
  Filled 2013-03-19: qty 250

## 2013-03-19 MED ORDER — AMLODIPINE BESYLATE 5 MG PO TABS
5.0000 mg | ORAL_TABLET | Freq: Every day | ORAL | Status: DC
  Administered 2013-03-19 – 2013-03-27 (×9): 5 mg via ORAL
  Filled 2013-03-19 (×9): qty 1

## 2013-03-19 MED ORDER — CLONIDINE HCL 0.1 MG PO TABS
0.1000 mg | ORAL_TABLET | Freq: Three times a day (TID) | ORAL | Status: DC
  Administered 2013-03-19 – 2013-03-27 (×23): 0.1 mg via ORAL
  Filled 2013-03-19 (×23): qty 1

## 2013-03-19 MED ORDER — FUROSEMIDE 10 MG/ML IJ SOLN
80.0000 mg | Freq: Once | INTRAMUSCULAR | Status: AC
  Administered 2013-03-19: 80 mg via INTRAVENOUS
  Filled 2013-03-19: qty 8

## 2013-03-19 MED ORDER — MUPIROCIN 2 % EX OINT
1.0000 "application " | TOPICAL_OINTMENT | Freq: Two times a day (BID) | CUTANEOUS | Status: AC
  Administered 2013-03-19 – 2013-03-24 (×10): 1 via NASAL
  Filled 2013-03-19: qty 22

## 2013-03-19 NOTE — ED Notes (Addendum)
Pt c/o cp/sob today. Pt c/o [facility] cp radiating to left side with some sob. cbg-106 per ems.

## 2013-03-19 NOTE — Progress Notes (Signed)
ANTICOAGULATION CONSULT NOTE  Pharmacy Consult for Heparin Indication: chest pain/ACS  No Known Allergies  Patient Measurements: Height: 6\' 1"  (185.4 cm) Weight: 283 lb 1.1 oz (128.4 kg) IBW/kg (Calculated) : 79.9 Heparin Dosing Weight: 108 kg  Vital Signs: Temp: 98 F (36.7 C) (04/12 2000) Temp src: Oral (04/12 2000) BP: 132/59 mmHg (04/12 2200) Pulse Rate: 85 (04/12 2200)  Labs:  Recent Labs  03/19/13 1409 03/19/13 2153  HGB 11.2*  --   HCT 34.2*  --   PLT 151  --   HEPARINUNFRC  --  0.30  CREATININE 2.41*  --   TROPONINI <0.30 <0.30    Estimated Creatinine Clearance: 41.2 ml/min (by C-G formula based on Cr of 2.41).   Medical History: Past Medical History  Diagnosis Date  . Chronic obstructive pulmonary disease     Chronic bronchitis;  home oxygen; multiple exacerbations  . Hypertension   . Cellulitis of lower leg   . Sleep apnea     Severe on a sleep study in 12/2010  . Hypothyroid     10/2002: TSH-0.43, T4-0.77  . Tobacco abuse, in remission     20 pack years; discontinued 1998  . Hyperlipidemia     No lipid profile available  . 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 Morbid obesity with BMI of 50.0-59.9, adult 10/28/2012  . Seizure disorder     Onset after craniotomy  . Aortic valve disease 10/2012    Prominent diastolic murmur; mild to moderate aortic insufficiency on echocardiogram in 10/2012  . Chronic kidney disease     S/P right nephrectomy for hypernephroma in 2010  . Seizures   . CHF (congestive heart failure)   . Diabetes mellitus without complication     Medications:  Scheduled:  . [COMPLETED] albuterol      . amLODipine  5 mg Oral Daily  . [COMPLETED] aspirin  325 mg Oral Once  . [START ON 03/20/2013] atorvastatin  40 mg Oral q1800  . budesonide-formoterol  2 puff Inhalation BID  . [START ON 03/20/2013] Chlorhexidine Gluconate  Cloth  6 each Topical Q0600  . cloNIDine  0.1 mg Oral TID  . desmopressin  0.2 mg Oral BID  . dexamethasone  0.5 mg Oral Daily  . furosemide  40 mg Intravenous BID  . [COMPLETED] furosemide  80 mg Intravenous Once  . [COMPLETED] heparin  4,000 Units Intravenous Once  . levothyroxine  150 mcg Oral QAC breakfast  . linagliptin  5 mg Oral Daily  . mupirocin ointment  1 application Nasal BID  . [COMPLETED] nitroGLYCERIN  5-200 mcg/min Intravenous Once  . phenytoin  200-300 mg Oral Daily  . tiotropium  18 mcg Inhalation Daily    Assessment: 67 yo M presenting with chest pain. CE negative. No overt bleeding noted. Heparin level therapeutic.   Goal of Therapy:  Heparin level 0.3-0.7 units/ml Monitor platelets by anticoagulation protocol: Yes   Plan:  Continue heparin infusion at 1300 units/hr Daily heparin level & CBC while on heparin  Elson Clan 03/19/2013,10:43 PM
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
## 2013-03-19 NOTE — Progress Notes (Signed)
ANTICOAGULATION CONSULT NOTE - Initial Consult  Pharmacy Consult for Heparin Indication: chest pain/ACS  No Known Allergies  Patient Measurements: Height: 6\' 1"  (185.4 cm) Weight: 278 lb (126.1 kg) IBW/kg (Calculated) : 79.9 Heparin Dosing Weight: 108 kg  Vital Signs: Temp: 98.2 F (36.8 C) (04/12 1340) BP: 142/61 mmHg (04/12 1340) Pulse Rate: 93 (04/12 1340)  Labs: No results found for this basename: HGB, HCT, PLT, APTT, LABPROT, INR, HEPARINUNFRC, CREATININE, CKTOTAL, CKMB, TROPONINI,  in the last 72 hours  Estimated Creatinine Clearance: 43.3 ml/min (by C-G formula based on Cr of 2.27).   Medical History: Past Medical History  Diagnosis Date  . Chronic obstructive pulmonary disease     Chronic bronchitis;  home oxygen; multiple exacerbations  . Hypertension   . Cellulitis of lower leg   . Sleep apnea     Severe on a sleep study in 12/2010  . Hypothyroid     10/2002: TSH-0.43, T4-0.77  . Tobacco abuse, in remission     20 pack years; discontinued 1998  . Hyperlipidemia     No lipid profile available  . 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 Morbid obesity with BMI of 50.0-59.9, adult 10/28/2012  . Seizure disorder     Onset after craniotomy  . Aortic valve disease 10/2012    Prominent diastolic murmur; mild to moderate aortic insufficiency on echocardiogram in 10/2012  . Chronic kidney disease     S/P right nephrectomy for hypernephroma in 2010  . Seizures   . CHF (congestive heart failure)   . Diabetes mellitus without complication     Medications:  Scheduled:  . aspirin  325 mg Oral Once  . nitroGLYCERIN  5-200 mcg/min Intravenous Once    Assessment: 67 yo M presenting with chest pain.  No overt bleeding noted; CBC pending.  Goal of Therapy:  Heparin level 0.3-0.7 units/ml Monitor platelets by anticoagulation protocol: Yes   Plan:  IF platelet  count > 100K,  Give 4000 units bolus x 1 Start heparin infusion at 1300 units/hr Check anti-Xa level in 8 hours and daily while on heparin Continue to monitor H&H and platelets  Elson Clan 03/19/2013,2:27 PM

## 2013-03-19 NOTE — Progress Notes (Addendum)
Started pt on symbicort, and morning spiriva mdi's after neb treatment. Pt has sleep apnea but does not wear CPAP at home, took equipment back. Also he states he takes nebs q4 but I believe they are only scheduled for q6. As needed. Order has been written for q6 prn albuterol neb.

## 2013-03-20 LAB — GLUCOSE, CAPILLARY: Glucose-Capillary: 83 mg/dL (ref 70–99)

## 2013-03-20 LAB — CBC
HCT: 32.9 % — ABNORMAL LOW (ref 39.0–52.0)
Hemoglobin: 11.2 g/dL — ABNORMAL LOW (ref 13.0–17.0)
RBC: 3.66 MIL/uL — ABNORMAL LOW (ref 4.22–5.81)

## 2013-03-20 LAB — HEPARIN LEVEL (UNFRACTIONATED): Heparin Unfractionated: 0.35 IU/mL (ref 0.30–0.70)

## 2013-03-20 MED ORDER — POTASSIUM CHLORIDE CRYS ER 20 MEQ PO TBCR
40.0000 meq | EXTENDED_RELEASE_TABLET | Freq: Two times a day (BID) | ORAL | Status: DC
  Administered 2013-03-20 – 2013-03-21 (×4): 40 meq via ORAL
  Filled 2013-03-20 (×4): qty 2

## 2013-03-20 MED ORDER — PHENYTOIN SODIUM EXTENDED 100 MG PO CAPS
200.0000 mg | ORAL_CAPSULE | ORAL | Status: DC
  Administered 2013-03-21 – 2013-03-24 (×2): 200 mg via ORAL
  Filled 2013-03-20 (×4): qty 2

## 2013-03-20 MED ORDER — PHENYTOIN SODIUM EXTENDED 100 MG PO CAPS
300.0000 mg | ORAL_CAPSULE | ORAL | Status: DC
  Administered 2013-03-20 – 2013-03-27 (×6): 300 mg via ORAL
  Filled 2013-03-20 (×5): qty 3

## 2013-03-20 MED ORDER — HEPARIN BOLUS VIA INFUSION
2000.0000 [IU] | Freq: Once | INTRAVENOUS | Status: AC
  Administered 2013-03-20: 2000 [IU] via INTRAVENOUS
  Filled 2013-03-20: qty 2000

## 2013-03-20 NOTE — Progress Notes (Signed)
Subjective: Patient was admitted yesterday due to chest pain and shortness of breath. [name] doing better today. [name] still on heparin drip. His cardiac enzymes are negative.  Objective: Vital signs in last 24 hours: Temp:  [98 F (36.7 C)-98.4 F (36.9 C)] 98.1 F (36.7 C) (04/13 0726) Pulse Rate:  [78-93] 78 (04/13 0600) Resp:  [12-23] 13 (04/13 0600) BP: (116-143)/(49-65) 136/59 mmHg (04/13 0600) SpO2:  [94 %-100 %] 98 % (04/13 0803) Weight:  [126.1 kg (278 lb)-128.4 kg (283 lb 1.1 oz)] 128.4 kg (283 lb 1.1 oz) (04/12 1855) Weight change:  Last BM Date: 03/19/13  Intake/Output from previous day: 04/12 0701 - 04/13 0700 In: 291.8 [P.O.:120; I.V.:171.8] Out: 1750 [Urine:1750]  PHYSICAL EXAM General appearance: alert and no distress Resp: diminished breath sounds bilaterally and rhonchi bilaterally Cardio: S1, S2 normal GI: soft, non-tender; bowel sounds normal; no masses,  no organomegaly Extremities: extremities normal, atraumatic, no cyanosis or edema  Lab Results:    @labtest @ [facility] No results found for this basename: PHART, PCO2, PO2ART, TCO2, HCO3,  in the last 72 hours CULTURES Recent Results (from the past 240 hour(s))  MRSA PCR SCREENING     Status: Abnormal   Collection Time    03/19/13  5:35 PM      Result Value Range Status   MRSA by PCR POSITIVE (*) NEGATIVE Final   Comment: RESULT CALLED TO, READ BACK BY AND VERIFIED WITH:     CHILDRESS,J ON [id number] BY SMITH,N AT 1900                The GeneXpert MRSA Assay (FDA     approved for NASAL specimens     only), is one component of a     comprehensive MRSA colonization     surveillance program. It is not     intended to diagnose MRSA     infection nor to guide or     monitor treatment for     MRSA infections.   Studies/Results: Dg Chest 2 View  03/19/2013  *RADIOLOGY REPORT*  Clinical Data: Chest pain.  Shortness of breath.  Prior history of CHF.  Smoker.  CHEST - 2 VIEW  Comparison: Two-view chest x-ray  01/11/2013, 06/01/2012.  Findings: Lateral image blurred by respiratory motion.  Cardiac silhouette markedly enlarged but stable.  Thoracic aorta tortuous atherosclerotic, unchanged.  Hilar and mediastinal contours otherwise unremarkable.  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  Mild diffuse interstitial pulmonary edema.  Small right pleural effusion.  No visible left pleural effusion.  Degenerative changes involving the lumbar spine.  IMPRESSION: Mild CHF superimposed upon COPD/emphysema.   Original Report Authenticated By: Hulan Saas, M.D.     Medications: I have reviewed the patient's current medications.  Assesment: 1. Chest pain 2. Acute on chronic Diastolic CHF  3.COPD with exacerbation  4.Chronic renal failure 5. Sleep Apnea syndrome  6. Hypertension  7. Morbid Obesity 8. Panhypopituitarism 9. H/O pituitary tumor and S/p surgical excision   Active Problems:   * No active hospital problems. *    Plan: Continue telemetry Continue serial cardiac enzyme Continue Iv diuretics Cardiology and nephrology consult.    LOS: 1 day   [name] [name] 03/20/2013, 9:38 AM
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
## 2013-03-20 NOTE — H&P (Signed)
NAME:  Pritz, Mackson                  ACCOUNT NO.:  [id number]  MEDICAL RECORD NO.:  [id number]  LOCATION:  [facility]                         FACILITY:  [facility]  PHYSICIAN:  Rockie Vawter G. Renard Matter, MD   DATE OF BIRTH:  February 08, 1946  DATE OF ADMISSION:  03/19/2013 DATE OF DISCHARGE:  LH                             HISTORY & PHYSICAL   ADDENDUM:  ADDITIONAL DIAGNOSES:  Hypokalemia, chronic renal insufficiency, and mild congestive heart failure, diastolic.     Sherre Wooton G. Renard Matter, MD     AGM/MEDQ  D:  03/19/2013  T:  03/20/2013  Job:  [id number]

## 2013-03-20 NOTE — Progress Notes (Addendum)
ANTICOAGULATION CONSULT NOTE  Pharmacy Consult for Heparin Indication: chest pain/ACS  No Known Allergies  Patient Measurements: Height: 6\' 1"  (185.4 cm) Weight: 283 lb 1.1 oz (128.4 kg) IBW/kg (Calculated) : 79.9 Heparin Dosing Weight: 108 kg  Vital Signs: Temp: 98.1 F (36.7 C) (04/13 0726) Temp src: Oral (04/13 0726) BP: 136/59 mmHg (04/13 0600) Pulse Rate: 78 (04/13 0600)  Labs:  Recent Labs  03/19/13 1409 03/19/13 2153 03/20/13 0524  HGB 11.2*  --  11.2*  HCT 34.2*  --  32.9*  PLT 151  --  139*  HEPARINUNFRC  --  0.30 0.19*  CREATININE 2.41*  --   --   TROPONINI <0.30 <0.30 <0.30    Estimated Creatinine Clearance: 41.2 ml/min (by C-G formula based on Cr of 2.41).   Medical History: Past Medical History  Diagnosis Date  . Chronic obstructive pulmonary disease     Chronic bronchitis;  home oxygen; multiple exacerbations  . Hypertension   . Cellulitis of lower leg   . Sleep apnea     Severe on a sleep study in 12/2010  . Hypothyroid     10/2002: TSH-0.43, T4-0.77  . Tobacco abuse, in remission     20 pack years; discontinued 1998  . Hyperlipidemia     No lipid profile available  . 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 Morbid obesity with BMI of 50.0-59.9, adult 10/28/2012  . Seizure disorder     Onset after craniotomy  . Aortic valve disease 10/2012    Prominent diastolic murmur; mild to moderate aortic insufficiency on echocardiogram in 10/2012  . Chronic kidney disease     S/P right nephrectomy for hypernephroma in 2010  . Seizures   . CHF (congestive heart failure)   . Diabetes mellitus without complication     Medications:  Scheduled:  . [COMPLETED] albuterol      . amLODipine  5 mg Oral Daily  . [COMPLETED] aspirin  325 mg Oral Once  . atorvastatin  40 mg Oral q1800  . budesonide-formoterol  2 puff Inhalation BID  . Chlorhexidine  Gluconate Cloth  6 each Topical Q0600  . cloNIDine  0.1 mg Oral TID  . desmopressin  0.2 mg Oral BID  . dexamethasone  0.5 mg Oral Daily  . furosemide  40 mg Intravenous BID  . [COMPLETED] furosemide  80 mg Intravenous Once  . [COMPLETED] heparin  2,000 Units Intravenous Once  . [COMPLETED] heparin  4,000 Units Intravenous Once  . levothyroxine  150 mcg Oral QAC breakfast  . linagliptin  5 mg Oral Daily  . mupirocin ointment  1 application Nasal BID  . [COMPLETED] nitroGLYCERIN  5-200 mcg/min Intravenous Once  . [START ON 03/21/2013] phenytoin  200 mg Oral Custom  . phenytoin  300 mg Oral Custom  . tiotropium  18 mcg Inhalation Daily  . [DISCONTINUED] phenytoin  200-300 mg Oral Daily    Assessment: 67 yo M presenting with chest pain. CE negative. No overt bleeding noted. CBC stable. Initial heparin level was therapeutic, but below goal range this morning.   Goal of Therapy:  Heparin level 0.3-0.7 units/ml Monitor platelets by anticoagulation protocol: Yes   Plan:  Heparin 2000 unit IV bolus then increase heparin infusion to 1600 units/hr Check 6hr heparin level Daily heparin level & CBC while on heparin F/U plan  Elson Clan 03/20/2013,8:27 AM  03/20/2013@8 :20 PM Repeat heparin level therapeutic. Cont heparin  infusion at 1600 units/hr.  Follow-up morning labs. Junita Push, PharmD, BCPS

## 2013-03-20 NOTE — Progress Notes (Signed)
Report called to Zannie Kehr RN. Patient is being transferred via wheelchair with all personal belongings to room 329.  Will notify any family members of transfer.
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
## 2013-03-21 DIAGNOSIS — I509 Heart failure, unspecified: Secondary | ICD-10-CM

## 2013-03-21 DIAGNOSIS — I359 Nonrheumatic aortic valve disorder, unspecified: Secondary | ICD-10-CM

## 2013-03-21 DIAGNOSIS — N189 Chronic kidney disease, unspecified: Secondary | ICD-10-CM

## 2013-03-21 DIAGNOSIS — I10 Essential (primary) hypertension: Secondary | ICD-10-CM

## 2013-03-21 LAB — CBC
HCT: 36.9 % — ABNORMAL LOW (ref 39.0–52.0)
MCH: 29.4 pg (ref 26.0–34.0)
MCHC: 32.8 g/dL (ref 30.0–36.0)
MCV: 89.8 fL (ref 78.0–100.0)
Platelets: 148 10*3/uL — ABNORMAL LOW (ref 150–400)
RDW: 15.4 % (ref 11.5–15.5)

## 2013-03-21 LAB — GLUCOSE, CAPILLARY: Glucose-Capillary: 73 mg/dL (ref 70–99)

## 2013-03-21 LAB — BASIC METABOLIC PANEL
BUN: 46 mg/dL — ABNORMAL HIGH (ref 6–23)
Calcium: 9.1 mg/dL (ref 8.4–10.5)
Creatinine, Ser: 2.27 mg/dL — ABNORMAL HIGH (ref 0.50–1.35)
GFR calc Af Amer: 32 mL/min — ABNORMAL LOW (ref >90)
GFR calc non Af Amer: 28 mL/min — ABNORMAL LOW (ref >90)
Glucose, Bld: 92 mg/dL (ref 70–99)

## 2013-03-21 LAB — VITAMIN D 25 HYDROXY (VIT D DEFICIENCY, FRACTURES): Vit D, 25-Hydroxy: 11 ng/mL — ABNORMAL LOW (ref 30–89)

## 2013-03-21 LAB — PHOSPHORUS: Phosphorus: 3.5 mg/dL (ref 2.3–4.6)

## 2013-03-21 MED ORDER — ENOXAPARIN SODIUM 30 MG/0.3ML ~~LOC~~ SOLN
30.0000 mg | SUBCUTANEOUS | Status: DC
  Administered 2013-03-21: 30 mg via SUBCUTANEOUS
  Filled 2013-03-21: qty 0.3

## 2013-03-21 MED ORDER — FUROSEMIDE 10 MG/ML IJ SOLN
40.0000 mg | Freq: Three times a day (TID) | INTRAMUSCULAR | Status: DC
  Administered 2013-03-21 – 2013-03-22 (×2): 40 mg via INTRAVENOUS
  Filled 2013-03-21 (×2): qty 4

## 2013-03-21 MED ORDER — SORBITOL 70 % SOLN
30.0000 mL | Freq: Every day | Status: DC | PRN
  Administered 2013-03-21 (×2): 30 mL via ORAL
  Filled 2013-03-21 (×2): qty 30

## 2013-03-21 MED ORDER — ALUM & MAG HYDROXIDE-SIMETH 200-200-20 MG/5ML PO SUSP
30.0000 mL | ORAL | Status: DC | PRN
  Administered 2013-03-21 (×3): 30 mL via ORAL
  Filled 2013-03-21 (×3): qty 30

## 2013-03-21 MED ORDER — ENOXAPARIN SODIUM 60 MG/0.6ML ~~LOC~~ SOLN: 60.0000 mg | SUBCUTANEOUS | Status: DC

## 2013-03-21 NOTE — Progress Notes (Signed)
Subjective: Interval History: has complaints some abdominal pain around his suprapubic area. He denies any nausea vomiting. Appetite is good. Patient denies also any difficulty breathing..  Objective: Vital signs in last 24 hours: Temp:  [98.1 F (36.7 C)-98.6 F (37 C)] 98.1 F (36.7 C) (04/14 0528) Pulse Rate:  [74-91] 81 (04/14 0528) Resp:  [14-21] 18 (04/14 0528) BP: (122-147)/(51-86) 122/70 mmHg (04/14 0528) SpO2:  [92 %-100 %] 98 % (04/14 0731) Weight change:   Intake/Output from previous day: 04/13 0701 - 04/14 0700 In: 1509.2 [P.O.:1120; I.V.:389.2] Out: 1400 [Urine:1400] Intake/Output this shift:    General appearance: alert, cooperative and no distress Resp: clear to auscultation bilaterally Cardio: regular rate and rhythm, S1, S2 normal, no murmur, click, rub or gallop GI: soft, non-tender; bowel sounds normal; no masses,  no organomegaly Extremities: edema Trace edema  Lab Results:  Recent Labs  03/20/13 0524 03/21/13 0525  WBC 5.0 5.7  HGB 11.2* 12.1*  HCT 32.9* 36.9*  PLT 139* 148*   BMET:  Recent Labs  03/19/13 1409 03/21/13 0525  NA 138 138  K 3.0* 3.7  CL 100 102  CO2 28 25  GLUCOSE 97 92  BUN 47* 46*  CREATININE 2.41* 2.27*  CALCIUM 9.0 9.1   No results found for this basename: PTH,  in the last 72 hours Iron Studies: No results found for this basename: IRON, TIBC, TRANSFERRIN, FERRITIN,  in the last 72 hours  Studies/Results: Dg Chest 2 View  03/19/2013  *RADIOLOGY REPORT*  Clinical Data: Chest pain.  Shortness of breath.  Prior history of CHF.  Smoker.  CHEST - 2 VIEW  Comparison: Two-view chest x-ray 01/11/2013, 06/01/2012.  Findings: Lateral image blurred by respiratory motion.  Cardiac silhouette markedly enlarged but stable.  Thoracic aorta tortuous atherosclerotic, unchanged.  Hilar and mediastinal contours otherwise unremarkable.  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  Mild diffuse interstitial pulmonary edema.  Small  right pleural effusion.  No visible left pleural effusion.  Degenerative changes involving the lumbar spine.  IMPRESSION: Mild CHF superimposed upon COPD/emphysema.   Original Report Authenticated By: Hulan Saas, M.D.     I have reviewed the patient's current medications.  Assessment/Plan: Problem #1 acute kidney injury superimposed on chronic his BUN is 46 creatinine is 2.2 seventh presently his renal function seems to be improving.  Problem #2 hypokalemia is on potassium supplement potassium 3.7. Problem #3 hypertension his blood pressure is reasonably controlled Problem #4 sleep apnea Problem #5 history of panhypopituitarism Problem #6 history of seizure Problem #7 history of CHF patient is on Lasix with reasonable urine output. Problem #8 metabolic bone disease his calcium and phosphorus was in acceptable range presently patient is not on any binder. Plan: We'll continue his present management We'll follow tablet panel.   LOS: 2 days   Sanaai Doane S 03/21/2013,8:28 AM
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
## 2013-03-21 NOTE — Progress Notes (Signed)
ANTICOAGULATION CONSULT NOTE - Initial Consult  Pharmacy Consult for Lovenox Indication: VTE prophylaxis  No Known Allergies  Patient Measurements: Height: 6\' 1"  (185.4 cm) Weight: 283 lb 1.1 oz (128.4 kg) IBW/kg (Calculated) : 79.9   Vital Signs: Temp: 98.1 F (36.7 C) (04/14 0528) Temp src: Oral (04/14 0528) BP: 134/74 mmHg (04/14 1339) Pulse Rate: 104 (04/14 1339)  Labs:  Recent Labs  03/19/13 1409  03/19/13 2153 03/20/13 0524 03/20/13 1600 03/21/13 0525  HGB 11.2*  --   --  11.2*  --  12.1*  HCT 34.2*  --   --  32.9*  --  36.9*  PLT 151  --   --  139*  --  148*  HEPARINUNFRC  --   < > 0.30 0.19* 0.35 0.27*  CREATININE 2.41*  --   --   --   --  2.27*  TROPONINI <0.30  --  <0.30 <0.30  --   --   < > = values in this interval not displayed.  Estimated Creatinine Clearance: 43.7 ml/min (by C-G formula based on Cr of 2.27).   Medical History: Past Medical History  Diagnosis Date  . Chronic obstructive pulmonary disease     Chronic bronchitis;  home oxygen; multiple exacerbations  . Hypertension   . Cellulitis of lower leg   . Sleep apnea     Severe on a sleep study in 12/2010  . Hypothyroid     10/2002: TSH-0.43, T4-0.77  . Tobacco abuse, in remission     20 pack years; discontinued 1998  . Hyperlipidemia     No lipid profile available  . 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 Morbid obesity with BMI of 50.0-59.9, adult 10/28/2012  . Seizure disorder     Onset after craniotomy  . Aortic valve disease 10/2012    Prominent diastolic murmur; mild to moderate aortic insufficiency on echocardiogram in 10/2012  . Chronic kidney disease     S/P right nephrectomy for hypernephroma in 2010  . Seizures   . CHF (congestive heart failure)   . Diabetes mellitus without complication     Medications:  Scheduled:  . amLODipine  5 mg Oral Daily  . atorvastatin   40 mg Oral q1800  . budesonide-formoterol  2 puff Inhalation BID  . Chlorhexidine Gluconate Cloth  6 each Topical Q0600  . cloNIDine  0.1 mg Oral TID  . desmopressin  0.2 mg Oral BID  . dexamethasone  0.5 mg Oral Daily  . enoxaparin (LOVENOX) injection  60 mg Subcutaneous Q24H  . furosemide  40 mg Intravenous Q8H  . levothyroxine  150 mcg Oral QAC breakfast  . linagliptin  5 mg Oral Daily  . mupirocin ointment  1 application Nasal BID  . phenytoin  200 mg Oral Custom  . phenytoin  300 mg Oral Custom  . potassium chloride  40 mEq Oral BID  . tiotropium  18 mcg Inhalation Daily  . [DISCONTINUED] furosemide  40 mg Intravenous BID    Assessment: CrCl > 30 ml/min Patient weight 128.4 kg BMI > 30 (37.3)  Goal of Therapy:  DVT prophylaxis Monitor platelets by anticoagulation protocol: Yes   Plan:  Lovenox 60 mg SQ every 24 hours Monitor renal function Monitor platelets / CBC   Raquel James, Avory Mimbs Bennett 03/21/2013,4:13 PM

## 2013-03-21 NOTE — Progress Notes (Signed)
Subjective: Patient  Is resting. His chest pain has improved. His breathing is better.  Objective: Vital signs in last 24 hours: Temp:  [98.1 F (36.7 C)-98.6 F (37 C)] 98.1 F (36.7 C) (04/14 0528) Pulse Rate:  [74-91] 81 (04/14 0528) Resp:  [14-21] 18 (04/14 0528) BP: (122-147)/(51-86) 122/70 mmHg (04/14 0528) SpO2:  [92 %-100 %] 98 % (04/14 0731) Weight change:  Last BM Date: 03/19/13  Intake/Output from previous day: 04/13 0701 - 04/14 0700 In: 1509.2 [P.O.:1120; I.V.:389.2] Out: 1400 [Urine:1400]  PHYSICAL EXAM General appearance: alert and no distress Resp: diminished breath sounds bilaterally and rhonchi bilaterally Cardio: S1, S2 normal GI: soft, non-tender; bowel sounds normal; no masses,  no organomegaly Extremities: extremities normal, atraumatic, no cyanosis or edema  Lab Results:    @labtest @ [facility] No results found for this basename: PHART, PCO2, PO2ART, TCO2, HCO3,  in the last 72 hours CULTURES Recent Results (from the past 240 hour(s))  MRSA PCR SCREENING     Status: Abnormal   Collection Time    03/19/13  5:35 PM      Result Value Range Status   MRSA by PCR POSITIVE (*) NEGATIVE Final   Comment: RESULT CALLED TO, READ BACK BY AND VERIFIED WITH:     CHILDRESS,J ON [id number] BY SMITH,N AT 1900                The GeneXpert MRSA Assay (FDA     approved for NASAL specimens     only), is one component of a     comprehensive MRSA colonization     surveillance program. It is not     intended to diagnose MRSA     infection nor to guide or     monitor treatment for     MRSA infections.   Studies/Results: Dg Chest 2 View  03/19/2013  *RADIOLOGY REPORT*  Clinical Data: Chest pain.  Shortness of breath.  Prior history of CHF.  Smoker.  CHEST - 2 VIEW  Comparison: Two-view chest x-ray 01/11/2013, 06/01/2012.  Findings: Lateral image blurred by respiratory motion.  Cardiac silhouette markedly enlarged but stable.  Thoracic aorta tortuous atherosclerotic,  unchanged.  Hilar and mediastinal contours otherwise unremarkable.  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  Mild diffuse interstitial pulmonary edema.  Small right pleural effusion.  No visible left pleural effusion.  Degenerative changes involving the lumbar spine.  IMPRESSION: Mild CHF superimposed upon COPD/emphysema.   Original Report Authenticated By: Hulan Saas, M.D.     Medications: I have reviewed the patient's current medications.  Assesment: 1. Chest pain 2. Acute on chronic Diastolic CHF  3.COPD with exacerbation  4.Chronic renal failure 5. Sleep Apnea syndrome  6. Hypertension  7. Morbid Obesity 8. Panhypopituitarism 9. H/O pituitary tumor and S/p surgical excision   Active Problems:   * No active hospital problems. *    Plan: Continue telemetry Continue serial cardiac enzyme Continue Iv diuretics Cardiology consult Nephrology consult appreciated.     LOS: 2 days   Anvika Gashi 03/21/2013, 8:12 AM

## 2013-03-21 NOTE — Progress Notes (Signed)
UR Chart Review Completed  

## 2013-03-21 NOTE — Care Management Note (Signed)
    Page 1 of 2   03/25/2013     11:05:45 AM   CARE MANAGEMENT NOTE 03/25/2013  Patient:  [name] [name], [name] [name]   Account Number:  [id number]  Date Initiated:  03/21/2013  Documentation initiated by:  Sharrie Rothman  Subjective/Objective Assessment:   Pt admitted from home with CHF. Pt lives alone and has a daughter who is very active in the care of the pt. Pt is fairly independent with ADl's. Pt has O2, walker, cpap from [place]. Has used [facility] in the past.     Action/Plan:   Will monitor for [facility] needs. Pt is agreeable to resume HH at discharge. Pt admitted that he has not been compliant with his treatment plan at home. CM educated pt on importance of compliance of CHF tx to decrease hospitalizations.   Anticipated DC Date:  03/25/2013   Anticipated DC Plan:  HOME W HOME HEALTH SERVICES      DC Planning Services  CM consult      [facility] Choice  HOME HEALTH   Choice offered to / List presented to:  C-1 Patient        HH arranged  HH-1 RN      [facility] agency  [facility]   Status of service:  Completed, signed off Medicare Important Message given?  YES (If response is "NO", the following Medicare IM given date fields will be blank) Date Medicare IM given:  03/25/2013 Date Additional Medicare IM given:    Discharge Disposition:  HOME W HOME HEALTH SERVICES  Per UR Regulation:    If discussed at Long Length of Stay Meetings, dates discussed:   03/24/2013    Comments:  03/25/13 1102 Arlyss Queen, RN BSN CM Pt potential discharge over the weekend with [facility]. Weekend staff to call and fax HH orders at discharge. Pt has DME and scales. [facility] will also be alerted of potential discharge as well.  03/21/13 1345 Arlyss Queen, RN BSN CM Pt would like a 3N1 at discharge. CM will arrange with [facility].

## 2013-03-21 NOTE — Progress Notes (Signed)
ANTICOAGULATION CONSULT NOTE  Pharmacy Consult for Heparin Indication: chest pain/ACS  No Known Allergies  Patient Measurements: Height: 6\' 1"  (185.4 cm) Weight: 283 lb 1.1 oz (128.4 kg) IBW/kg (Calculated) : 79.9 Heparin Dosing Weight: 108 kg  Vital Signs: Temp: 98.1 F (36.7 C) (04/14 0528) Temp src: Oral (04/14 0528) BP: 122/70 mmHg (04/14 0528) Pulse Rate: 81 (04/14 0528)  Labs:  Recent Labs  03/19/13 1409  03/19/13 2153 03/20/13 0524 03/20/13 1600 03/21/13 0525  HGB 11.2*  --   --  11.2*  --  12.1*  HCT 34.2*  --   --  32.9*  --  36.9*  PLT 151  --   --  139*  --  148*  HEPARINUNFRC  --   < > 0.30 0.19* 0.35 0.27*  CREATININE 2.41*  --   --   --   --  2.27*  TROPONINI <0.30  --  <0.30 <0.30  --   --   < > = values in this interval not displayed.  Estimated Creatinine Clearance: 43.7 ml/min (by C-G formula based on Cr of 2.27).   Medical History: Past Medical History  Diagnosis Date  . Chronic obstructive pulmonary disease     Chronic bronchitis;  home oxygen; multiple exacerbations  . Hypertension   . Cellulitis of lower leg   . Sleep apnea     Severe on a sleep study in 12/2010  . Hypothyroid     10/2002: TSH-0.43, T4-0.77  . Tobacco abuse, in remission     20 pack years; discontinued 1998  . Hyperlipidemia     No lipid profile available  . 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 Morbid obesity with BMI of 50.0-59.9, adult 10/28/2012  . Seizure disorder     Onset after craniotomy  . Aortic valve disease 10/2012    Prominent diastolic murmur; mild to moderate aortic insufficiency on echocardiogram in 10/2012  . Chronic kidney disease     S/P right nephrectomy for hypernephroma in 2010  . Seizures   . CHF (congestive heart failure)   . Diabetes mellitus without complication     Medications:  Scheduled:  . amLODipine  5 mg Oral Daily  .  atorvastatin  40 mg Oral q1800  . budesonide-formoterol  2 puff Inhalation BID  . Chlorhexidine Gluconate Cloth  6 each Topical Q0600  . cloNIDine  0.1 mg Oral TID  . desmopressin  0.2 mg Oral BID  . dexamethasone  0.5 mg Oral Daily  . furosemide  40 mg Intravenous BID  . levothyroxine  150 mcg Oral QAC breakfast  . linagliptin  5 mg Oral Daily  . mupirocin ointment  1 application Nasal BID  . phenytoin  200 mg Oral Custom  . phenytoin  300 mg Oral Custom  . potassium chloride  40 mEq Oral BID  . tiotropium  18 mcg Inhalation Daily    Assessment: 67 yo M presenting with chest pain. CE negative. No overt bleeding noted. CBC stable. Heparin level slightly below therapeutic range this morning.  Patient will have been on 48 hrs of heparin infusion @ 1300 today.    Goal of Therapy:  Heparin level 0.3-0.7 units/ml Monitor platelets by anticoagulation protocol: Yes   Plan:  Increase heparin infusion to 1750 units/hr Daily heparin level & CBC while on heparin F/U duration of therapy- if no plans for intervention consider change to px dosing after 48hrs.  Elson Clan 03/21/2013,8:33 AM

## 2013-03-22 DIAGNOSIS — I359 Nonrheumatic aortic valve disorder, unspecified: Secondary | ICD-10-CM

## 2013-03-22 DIAGNOSIS — E785 Hyperlipidemia, unspecified: Secondary | ICD-10-CM

## 2013-03-22 LAB — BASIC METABOLIC PANEL
CO2: 25 mEq/L (ref 19–32)
Chloride: 103 mEq/L (ref 96–112)
Creatinine, Ser: 2.26 mg/dL — ABNORMAL HIGH (ref 0.50–1.35)
Potassium: 4.3 mEq/L (ref 3.5–5.1)
Sodium: 137 mEq/L (ref 135–145)

## 2013-03-22 LAB — LIPID PANEL
Cholesterol: 157 mg/dL (ref 0–200)
HDL: 41 mg/dL (ref >39)
Total CHOL/HDL Ratio: 3.8 RATIO
Triglycerides: 170 mg/dL — ABNORMAL HIGH (ref <150)
VLDL: 34 mg/dL (ref 0–40)

## 2013-03-22 LAB — GLUCOSE, CAPILLARY: Glucose-Capillary: 91 mg/dL (ref 70–99)

## 2013-03-22 MED ORDER — TORSEMIDE 20 MG PO TABS
40.0000 mg | ORAL_TABLET | Freq: Every day | ORAL | Status: DC
  Administered 2013-03-22 – 2013-03-23 (×2): 40 mg via ORAL
  Filled 2013-03-22 (×2): qty 2

## 2013-03-22 MED ORDER — SODIUM CHLORIDE 0.9 % IJ SOLN
3.0000 mL | Freq: Two times a day (BID) | INTRAMUSCULAR | Status: DC
  Administered 2013-03-22 – 2013-03-26 (×9): 3 mL via INTRAVENOUS

## 2013-03-22 MED ORDER — SODIUM CHLORIDE 0.9 % IV SOLN: 250.0000 mL | INTRAVENOUS | Status: DC | PRN

## 2013-03-22 MED ORDER — ENOXAPARIN SODIUM 60 MG/0.6ML ~~LOC~~ SOLN
60.0000 mg | SUBCUTANEOUS | Status: DC
  Administered 2013-03-22 – 2013-03-26 (×5): 60 mg via SUBCUTANEOUS
  Filled 2013-03-22 (×5): qty 0.6

## 2013-03-22 MED ORDER — SODIUM CHLORIDE 0.9 % IJ SOLN: 3.0000 mL | INTRAMUSCULAR | Status: DC | PRN

## 2013-03-22 NOTE — Progress Notes (Signed)
Subjective: Patient  feels better. No chest pain. His breathing is improving. No chest pain, cough or shortness of breath.  Objective: Vital signs in last 24 hours: Temp:  [98 F (36.7 C)-98.4 F (36.9 C)] 98 F (36.7 C) (04/15 0503) Pulse Rate:  [87-104] 87 (04/15 0503) Resp:  [17-18] 18 (04/15 0503) BP: (121-134)/(60-74) 121/63 mmHg (04/15 0503) SpO2:  [92 %-98 %] 92 % (04/15 0716) Weight:  [129.2 kg (284 lb 13.4 oz)] 129.2 kg (284 lb 13.4 oz) (04/15 0503) Weight change:  Last BM Date: 03/21/13  Intake/Output from previous day: 04/14 0701 - 04/15 0700 In: 720 [P.O.:720] Out: 700 [Urine:700]  PHYSICAL EXAM General appearance: alert and no distress Resp: diminished breath sounds bilaterally and rhonchi bilaterally Cardio: S1, S2 normal GI: soft, non-tender; bowel sounds normal; no masses,  no organomegaly Extremities: extremities normal, atraumatic, no cyanosis or edema  Lab Results:    @labtest @ [facility] No results found for this basename: PHART, PCO2, PO2ART, TCO2, HCO3,  in the last 72 hours CULTURES Recent Results (from the past 240 hour(s))  MRSA PCR SCREENING     Status: Abnormal   Collection Time    03/19/13  5:35 PM      Result Value Range Status   MRSA by PCR POSITIVE (*) NEGATIVE Final   Comment: RESULT CALLED TO, READ BACK BY AND VERIFIED WITH:     CHILDRESS,J ON [id number] BY SMITH,N AT 1900                The GeneXpert MRSA Assay (FDA     approved for NASAL specimens     only), is one component of a     comprehensive MRSA colonization     surveillance program. It is not     intended to diagnose MRSA     infection nor to guide or     monitor treatment for     MRSA infections.   Studies/Results: No results found.  Medications: I have reviewed the patient's current medications.  Assesment: 1. Chest pain 2. Acute on chronic Diastolic CHF  3.COPD with exacerbation  4.Chronic renal failure 5. Sleep Apnea syndrome  6. Hypertension  7. Morbid  Obesity 8. Panhypopituitarism 9. H/O pituitary tumor and S/p surgical excision   Active Problems:   Congestive heart failure-predominantly right-sided   Hypertension   Chronic kidney disease   Sleep apnea    Plan: Continue telemetry Continue Iv diuretics Cardiology consult appreciated Will monitor BMp     LOS: 3 days   Raahim Shartzer 03/22/2013, 8:24 AM

## 2013-03-22 NOTE — Progress Notes (Signed)
Subjective: Interval History: has no complaint of difficulty in breathing. Patient states that she has some epigastric discomfort. He denies any nausea vomiting..  Objective: Vital signs in last 24 hours: Temp:  [98 F (36.7 C)-98.4 F (36.9 C)] 98 F (36.7 C) (04/15 0503) Pulse Rate:  [87-104] 87 (04/15 0503) Resp:  [17-18] 18 (04/15 0503) BP: (121-134)/(60-74) 121/63 mmHg (04/15 0503) SpO2:  [92 %-98 %] 92 % (04/15 0716) Weight:  [129.2 kg (284 lb 13.4 oz)] 129.2 kg (284 lb 13.4 oz) (04/15 0503) Weight change:   Intake/Output from previous day: 04/14 0701 - 04/15 0700 In: 720 [P.O.:720] Out: 700 [Urine:700] Intake/Output this shift:    General appearance: alert, cooperative and no distress Resp: diminished breath sounds bilaterally Cardio: regular rate and rhythm, S1, S2 normal, no murmur, click, rub or gallop GI: soft, non-tender; bowel sounds normal; no masses,  no organomegaly Extremities: edema Trace bilateral edema  Lab Results:  Recent Labs  03/20/13 0524 03/21/13 0525  WBC 5.0 5.7  HGB 11.2* 12.1*  HCT 32.9* 36.9*  PLT 139* 148*   BMET:  Recent Labs  03/21/13 0525 03/22/13 0516  NA 138 137  K 3.7 4.3  CL 102 103  CO2 25 25  GLUCOSE 92 95  BUN 46* 44*  CREATININE 2.27* 2.26*  CALCIUM 9.1 9.0   No results found for this basename: PTH,  in the last 72 hours Iron Studies: No results found for this basename: IRON, TIBC, TRANSFERRIN, FERRITIN,  in the last 72 hours  Studies/Results: No results found.  I have reviewed the patient's current medications.  Assessment/Plan: Problem #1 renal failure chronic his BUN is 44 and creatinine is 2.26 renal function at this moment seems to be stable. Patient is stage III chronic renal failure.  Problem #2 hypokalemia potassium is 4.3 has corrected patient on potassium supplement Problem #3 CHF a shunt on Lasix reasonable urine output. Problem #4 hypertension his blood pressure seems to be reasonably  controlled. Problem #5 metabolic bone disease his calcium and phosphorus is was in acceptable range. Problem #6 anemia mild Problem #7 history of panhypopituitarism Plan: We'll DC IV Lasix We'll DC potassium We'll start him on Demadex 40 mg by mouth once a day We'll check his basic metabolic panel in the morning.   LOS: 3 days   Timica Marcom S 03/22/2013,8:22 AM

## 2013-03-22 NOTE — Progress Notes (Signed)
Patient has been active with [facility] Care Management in the recent past as a benefit of his [employer] insurance. Patient will receive post transition of care call upon discharge to assess further needs and if patient wants to resume [facility] Care Management services.   Raiford Noble, MSN- Ed, RN,BSN- [facility] Liaison, [phone]

## 2013-03-22 NOTE — Progress Notes (Signed)
*  PRELIMINARY RESULTS* Echocardiogram 2D Echocardiogram has been performed.  Roberts, Eythan Jayne M 03/22/2013, 3:50 PM 

## 2013-03-22 NOTE — Progress Notes (Signed)
   Primary cardiologist: Dr. Conway Bing  SUBJECTIVE Feeling about the same.  LABS: Basic Metabolic Panel:  Recent Labs  16/10/96 0525 03/22/13 0516  NA 138 137  K 3.7 4.3  CL 102 103  CO2 25 25  GLUCOSE 92 95  BUN 46* 44*  CREATININE 2.27* 2.26*  CALCIUM 9.1 9.0  PHOS 3.5  --    Liver Function Tests:  Recent Labs  03/19/13 1409  AST 26  ALT 19  ALKPHOS 46  BILITOT 0.4  PROT 7.5  ALBUMIN 3.8   CBC:  Recent Labs  03/20/13 0524 03/21/13 0525  WBC 5.0 5.7  HGB 11.2* 12.1*  HCT 32.9* 36.9*  MCV 89.9 89.8  PLT 139* 148*   Cardiac Enzymes:  Recent Labs  03/19/13 1409 03/19/13 2153 03/20/13 0524  TROPONINI <0.30 <0.30 <0.30   Fasting Lipid Panel:  Recent Labs  03/22/13 0517  CHOL 157  HDL 41  LDLCALC 82  TRIG 170*  CHOLHDL 3.8   Echo: Pending results  PHYSICAL EXAM BP 121/63  Pulse 87  Temp(Src) 98 F (36.7 C) (Oral)  Resp 18  Ht 6\' 1"  (1.854 m)  Wt 284 lb 13.4 oz (129.2 kg)  BMI 37.59 kg/m2  SpO2 92% General: No acute distress  Lungs: Mild bibasilar crackles, without wheezes. Heart: HRRR S1 S2 1/6 systolic murmur Extremities: 2+ edema with woody skin changes..  DP +1 Psych:  Flat affect.  TELEMETRY: Reviewed telemetry pt in: NSR with RBBB.  ASSESSMENT AND PLAN:  1. Acute on Chronic Diastolic CHF in the setting of Grade II diastolic dysfunction: . Patient continues to have some pressure in his chest substernally. [name] actually gaining wt, with very little urine output recorded despite increased doses of lasix to Q 8 hours yesterday. Echo is pending. Home medications have him on BOTH torsemide and lasix. Continue diureses with just IV lasix for now.  2. Hypertension: Currently pressure is well-controlled on current medication regimen to include amlodipine and clonidine.   3. Chest Pressure: Probably related to distended abdomen with intra-abdominal pressure associated with fluid retention. LFTs were not found to be elevated. No  evidence of ACS. Heparin was discontinued 03/21/2013.  4. CKD: Improving creatinine with ongoing management-Dr. Fausto Skillern   5. Diabetes: Check hemoglobin A1c. Not yet resulted.  [name]. [name] NP [facility] 03/22/2013, 9:22 AM   Attending note:  Patient seen and examined. Above note by Ms. Lawrence NP was modified. Patient is being treated for apparent acute on chronic diastolic heart failure, followup echocardiogram is pending at this time. He has had chest pain symptoms, although cardiac markers argue against ACS. Nephrology continues to assist with management, diuresis has not been very brisk recently. Lasix dose was increased yesterday, although he was taken off Lasix and placed back on oral Demadex by Dr. Kristian Covey.  Blood pressure control is good, and creatinine relatively stable at 2.2.   We will followup on the echocardiogram.  Jonelle Sidle, M.D., F.A.C.C.

## 2013-03-23 LAB — CBC
HCT: 33.2 % — ABNORMAL LOW (ref 39.0–52.0)
Hemoglobin: 10.8 g/dL — ABNORMAL LOW (ref 13.0–17.0)
WBC: 5.1 10*3/uL (ref 4.0–10.5)

## 2013-03-23 LAB — BASIC METABOLIC PANEL
Chloride: 101 mEq/L (ref 96–112)
GFR calc Af Amer: 30 mL/min — ABNORMAL LOW (ref >90)
GFR calc non Af Amer: 26 mL/min — ABNORMAL LOW (ref >90)
Potassium: 4.2 mEq/L (ref 3.5–5.1)
Sodium: 138 mEq/L (ref 135–145)

## 2013-03-23 LAB — GLUCOSE, CAPILLARY
Glucose-Capillary: 89 mg/dL (ref 70–99)
Glucose-Capillary: 127 mg/dL — ABNORMAL HIGH (ref 70–99)

## 2013-03-23 MED ORDER — TORSEMIDE 20 MG/2ML IV SOLN
40.0000 mg | Freq: Two times a day (BID) | INTRAVENOUS | Status: DC
  Filled 2013-03-23 (×5): qty 4

## 2013-03-23 MED ORDER — FUROSEMIDE 10 MG/ML IJ SOLN
80.0000 mg | Freq: Two times a day (BID) | INTRAMUSCULAR | Status: DC
  Administered 2013-03-23 (×2): 80 mg via INTRAVENOUS
  Filled 2013-03-23: qty 8
  Filled 2013-03-23: qty 4
  Filled 2013-03-23: qty 8

## 2013-03-23 MED ORDER — TORSEMIDE 20 MG PO TABS: 20.0000 mg | ORAL_TABLET | Freq: Two times a day (BID) | ORAL | Status: DC

## 2013-03-23 NOTE — Progress Notes (Signed)
Primary cardiologist: Dr. Key[place] Bing  SUBJECTIVE: Feeling worse with increased weight and edema.  LABS: Basic Metabolic Panel:  Recent Labs  40/98/11 0525 03/22/13 0516 03/23/13 0533  NA 138 137 138  K 3.7 4.3 4.2  CL 102 103 101  CO2 25 25 28   GLUCOSE 92 95 75  BUN 46* 44* 49*  CREATININE 2.27* 2.26* 2.43*  CALCIUM 9.1 9.0 8.9  PHOS 3.5  --   --    CBC:  Recent Labs  03/21/13 0525 03/23/13 0533  WBC 5.7 5.1  HGB 12.1* 10.8*  HCT 36.9* 33.2*  MCV 89.8 90.7  PLT 148* 130*   Hemoglobin A1C:  Recent Labs  03/22/13 0516  HGBA1C 6.0*   Fasting Lipid Panel:  Recent Labs  03/22/13 0517  CHOL 157  HDL 41  LDLCALC 82  TRIG 170*  CHOLHDL 3.8    Echocardiogram 08/21/7828 Left ventricle: Systolic function was low normal. The estimated ejection fraction was in the range of 50% to 55%. There is hypokinesis of the basalinferior and inferoseptal myocardium. Features are consistent with a pseudonormal left ventricular filling pattern, with concomitant abnormal relaxation and increased filling pressure (grade 2 diastolic dysfunction). - Aortic valve: Trileaflet; mildly calcified leaflets. There was no stenosis. Upper end moderate regurgitation. There is fluttering of the anterior mitral leaflet noted on M mode with AR jet directed eccentrically. Vena contracta under 6 mm. Additional useful measurement would be pulsed Doppler in the descending aorta (subcostal view) to assess for holodiatolic flow reversal - if present would be more consistent with severe AR. Mean gradient: 8mm Hg (S). Regurgitation pressure half-time: . LV end systolic dimention 40.6 mm. No obvious progression compared to prior study.   PHYSICAL EXAM BP 180/66  Pulse 80  Temp(Src) 98.2 F (36.8 C) (Oral)  Resp 20  Ht 6\' 1"  (1.854 m)  Wt 286 lb 6 oz (129.9 kg)  BMI 37.79 kg/m2  SpO2 99%  General: Obese, no acute distress.   Lungs: Diminished bilaterally but clear in the  upper lobes. Heart: HRRR 2/6 systolic murmur and 3/6 diastolic murmur. Extremities: 2-3+ edema. Woody skin changes and stasis. Diminished DP Neuro: Alert and oriented X 3.  TELEMETRY: Reviewed telemetry pt in: NSR with RBBB   ASSESSMENT AND PLAN:  1. Acute on Chronic Diastolic CHF: Inadequte diuresis, was placed on oral Demadex by nephrology yesterday. Will change back to IV lasix and place Foley catheter. Can follow renal function.  2. Aortic regurgitation:  Appears to be at least moderate at this point, LV end-systolic dimension still only 40 mm without progression compared to previous study. Still concerned that this is leading to symptomatology and decompensation of diastolic heart failure. May need further evaluation.  3. Hypertension: Currently pressure is well-controlled on current medication regimen to include amlodipine and clonidine.   4. Chest Pressure:  No evidence of ACS by enzymes.  4. CKD: Creatinine 2.4, GFR 30.  5. Diabetes: Check hemoglobin A1c. Not yet resulted.   [name]. [name] NP [facility] 03/23/2013, 10:06 AM   Attending note:  Patient seen and examined. He has not improved significantly, diuresis is poor and IV Lasix was discontinued yesterday by nephrology with continuation of oral Demadex. Agree with change back to IV Lasix at this point and placement of Foley catheter. It may be worth starting a Lasix infusion, otherwise continue heart rate and blood pressure control. Aortic regurgitation looks to be at least moderate and is likely contributing to the patient's symptomatology as  well. This may ultimately require further assessment, however would try to optimize volume status first if possible.  Jonelle Sidle, M.D., F.A.C.C.

## 2013-03-23 NOTE — Progress Notes (Signed)
Met with patient at bedside.  Patient was drifting in and out of sleep during conversation.  Staff nurse present.  Patient agreed to having his [facility] Coordinator contact him within 72 hours post discharge.  He maintains that he does have a scale at home and is able to procure his medications.  Made RNCM aware that [facility] is following.  Patient may benefit from home telemonitoring for weight management and accountability.  This may be available through [facility] or his preferred [facility] agency.  Of note, [facility] Care Management services does not replace or interfere with any services that are arranged by inpatient case management or social work.  For additional questions or referrals please contact Anibal Henderson BSN RN [facility] Liaison at [phone].

## 2013-03-23 NOTE — Progress Notes (Signed)
[name] [name]  MRN: [id number]  DOB/AGE: 69-Mar-1947 69 y.o.  Primary Care Physician:BEFEKADU,BELAYENH S, MD  Admit date: 03/19/2013  Chief Complaint:  Chief Complaint  Patient presents with  . Chest Pain  . Shortness of Breath    S-Pt presented on  03/19/2013 with  Chief Complaint  Patient presents with  . Chest Pain  . Shortness of Breath  .    Pt says he is feeling the same.   Pt says he still has dyspne and feels that his abdomen girth is not going down.   Meds . amLODipine  5 mg Oral Daily  . atorvastatin  40 mg Oral q1800  . budesonide-formoterol  2 puff Inhalation BID  . Chlorhexidine Gluconate Cloth  6 each Topical Q0600  . cloNIDine  0.1 mg Oral TID  . desmopressin  0.2 mg Oral BID  . dexamethasone  0.5 mg Oral Daily  . enoxaparin (LOVENOX) injection  60 mg Subcutaneous Q24H  . levothyroxine  150 mcg Oral QAC breakfast  . linagliptin  5 mg Oral Daily  . mupirocin ointment  1 application Nasal BID  . phenytoin  200 mg Oral Custom  . phenytoin  300 mg Oral Custom  . sodium chloride  3 mL Intravenous Q12H  . tiotropium  18 mcg Inhalation Daily  . torsemide  40 mg Oral Daily      Physical Exam: Vital signs in last 24 hours: Temp:  [98.1 F (36.7 C)-98.2 F (36.8 C)] 98.2 F (36.8 C) (04/16 0557) Pulse Rate:  [80-85] 80 (04/16 0557) Resp:  [20] 20 (04/16 0557) BP: (113-180)/(48-72) 180/66 mmHg (04/16 0557) SpO2:  [99 %-100 %] 99 % (04/16 0804) Weight:  [286 lb 6 oz (129.9 kg)] 286 lb 6 oz (129.9 kg) (04/16 0557) Weight change: 1 lb 8.7 oz (0.7 kg) Last BM Date: 03/21/13  Intake/Output from previous day: 04/15 0701 - 04/16 0700 In: 583 [P.O.:580; I.V.:3] Out: 500 [Urine:500]     Physical Exam: General- pt is awake,alert, oriented to time place and person Resp- No acute REsp distress, Crackles+B/L  CVS- S1S2 regular in rate and rhythm, Systolic Ejection Murmur + GIT- BS+, soft, NT, ND EXT- 2+ LE Edema, Cyanosis   Lab Results: CBC  Recent  Labs  03/21/13 0525 03/23/13 0533  WBC 5.7 5.1  HGB 12.1* 10.8*  HCT 36.9* 33.2*  PLT 148* 130*    BMET  Recent Labs  03/22/13 0516 03/23/13 0533  NA 137 138  K 4.3 4.2  CL 103 101  CO2 25 28  GLUCOSE 95 75  BUN 44* 49*  CREATININE 2.26* 2.43*  CALCIUM 9.0 8.9   Trend Creat 2014  2.0--2.5           ( This admission 2.2--2.4) 2013  2.1--2.5 2012  1.5--1.7 2011  1.8 MICRO Recent Results (from the past 240 hour(s))  MRSA PCR SCREENING     Status: Abnormal   Collection Time    03/19/13  5:35 PM      Result Value Range Status   MRSA by PCR POSITIVE (*) NEGATIVE Final   Comment: RESULT CALLED TO, READ BACK BY AND VERIFIED WITH:     CHILDRESS,J ON [id number] BY SMITH,N AT 1900                The GeneXpert MRSA Assay (FDA     approved for NASAL specimens     only), is one component of a     comprehensive MRSA colonization  surveillance program. It is not     intended to diagnose MRSA     infection nor to guide or     monitor treatment for     MRSA infections.      Lab Results  Component Value Date   PTH 230.9* 09/09/2012   CALCIUM 8.9 03/23/2013   PHOS 3.5 03/21/2013               Impression: 1)Renal  AKI secondary to Prerenal/ CHF  AKI on CKD vs CKD at baseline  Creat near to baseline. CKD stage 3/4 . CKD since 2011- MOst likley before that CKD secondary to HTN/ Cardiorenal Progression of CKD slow     2)HTN Target Organ damage  CKD CHF Medication-  On Diuretics- On Calcium Channel Blockers On Central Acting Sympatholytics- Clonidine   3)Anemia HGb at goal (9--11)   4)CKD Mineral-Bone Disorder PTH elevated. Secondary Hyperparathyroidism present. Phosphorus at goal.   5)CVS  CHF  Aortic Regurgitaion   Cardiology following  6)FEN  Normokalemic NOrmonatremic  7)Acid base Co2 at goal     Plan:  Agree with Changing Diuretics to IV Will follow bmet.      BHUTANI,MANPREET S 03/23/2013, 9:58 AM

## 2013-03-23 NOTE — Progress Notes (Signed)
Subjective: Patient  Complains of shortness of breath and worsening leg edema. No chest pain..  Objective: Vital signs in last 24 hours: Temp:  [98.1 F (36.7 C)-98.2 F (36.8 C)] 98.2 F (36.8 C) (04/16 0557) Pulse Rate:  [80-85] 80 (04/16 0557) Resp:  [20] 20 (04/16 0557) BP: (113-180)/(48-72) 180/66 mmHg (04/16 0557) SpO2:  [99 %-100 %] 99 % (04/16 0804) Weight:  [129.9 kg (286 lb 6 oz)] 129.9 kg (286 lb 6 oz) (04/16 0557) Weight change: 0.7 kg (1 lb 8.7 oz) Last BM Date: 03/21/13  Intake/Output from previous day: 04/15 0701 - 04/16 0700 In: 583 [P.O.:580; I.V.:3] Out: 500 [Urine:500]  PHYSICAL EXAM General appearance: alert and no distress Resp: diminished breath sounds bilaterally and rhonchi bilaterally Cardio: S1, S2 normal GI: soft, non-tender; bowel sounds normal; no masses,  no organomegaly Extremities: extremities normal, atraumatic, no cyanosis or edema  Lab Results:    @labtest @ ABGS No results found for this basename: PHART, PCO2, PO2ART, TCO2, HCO3,  in the last 72 hours CULTURES Recent Results (from the past 240 hour(s))  MRSA PCR SCREENING     Status: Abnormal   Collection Time    03/19/13  5:35 PM      Result Value Range Status   MRSA by PCR POSITIVE (*) NEGATIVE Final   Comment: RESULT CALLED TO, READ BACK BY AND VERIFIED WITH:     CHILDRESS,J ON [id number] BY SMITH,N AT 1900                The GeneXpert MRSA Assay (FDA     approved for NASAL specimens     only), is one component of a     comprehensive MRSA colonization     surveillance program. It is not     intended to diagnose MRSA     infection nor to guide or     monitor treatment for     MRSA infections.   Studies/Results: No results found.  Medications: I have reviewed the patient's current medications.  Assesment: 1. Chest pain 2. Acute on chronic Diastolic CHF  3.COPD with exacerbation  4.Chronic renal failure 5. Sleep Apnea syndrome  6. Hypertension  7. Morbid Obesity 8.  Panhypopituitarism 9. H/O pituitary tumor and S/p surgical excision   Active Problems:   Congestive heart failure-predominantly right-sided   Hypertension   Chronic kidney disease   Sleep apnea    Plan: Will continue Iv diuretics according to cardiology  recommendation BMP in Am Continue regular treatment     LOS: 4 days   Damary Doland 03/23/2013, 10:35 AM

## 2013-03-24 LAB — BASIC METABOLIC PANEL
CO2: 27 mEq/L (ref 19–32)
Chloride: 101 mEq/L (ref 96–112)
Creatinine, Ser: 2.34 mg/dL — ABNORMAL HIGH (ref 0.50–1.35)
Sodium: 138 mEq/L (ref 135–145)

## 2013-03-24 LAB — GLUCOSE, CAPILLARY
Glucose-Capillary: 123 mg/dL — ABNORMAL HIGH (ref 70–99)
Glucose-Capillary: 109 mg/dL — ABNORMAL HIGH (ref 70–99)

## 2013-03-24 MED ORDER — METOLAZONE 5 MG PO TABS
2.5000 mg | ORAL_TABLET | Freq: Two times a day (BID) | ORAL | Status: DC
  Administered 2013-03-24 – 2013-03-25 (×4): 2.5 mg via ORAL
  Filled 2013-03-24 (×5): qty 1

## 2013-03-24 MED ORDER — TORSEMIDE 20 MG PO TABS
50.0000 mg | ORAL_TABLET | Freq: Every day | ORAL | Status: DC
  Administered 2013-03-24: 50 mg via ORAL
  Filled 2013-03-24 (×2): qty 3

## 2013-03-24 NOTE — Progress Notes (Signed)
SUBJECTIVE:Feeling better concerning breathing status.  Abdominal distention and peripheral edema reduced  Basic Metabolic Panel:  Recent Labs  40/98/11 0533 03/24/13 0505  NA 138 138  K 4.2 3.9  CL 101 101  CO2 28 27  GLUCOSE 75 76  BUN 49* 51*  CREATININE 2.43* 2.34*  CALCIUM 8.9 8.9   CBC:  Recent Labs  03/23/13 0533  WBC 5.1  HGB 10.8*  HCT 33.2*  MCV 90.7  PLT 130*    Recent Labs  03/22/13 0516  HGBA1C 6.0*   Fasting Lipid Panel:  Recent Labs  03/22/13 0517  CHOL 157  HDL 41  LDLCALC 82  TRIG 170*  CHOLHDL 3.8   PHYSICAL EXAM BP 119/55  Pulse 78  Temp(Src) 98.1 F (36.7 C) (Oral)  Resp 20  Ht 6\' 1"  (1.854 m)  Wt 283 lb 8.2 oz (128.6 kg)  BMI 37.41 kg/m2  SpO2 98%Wt loss 3 lbs. Total I&O since adm: -8.6 L including 3 L over the past 12 hours Weight: Down 3 lbs in 24 rs but increased 6 lbs since admission  General: Well developed, well nourished, in no acute distress Head: Eyes PERRLA, No xanthomas.   Normal cephalic and atramatic  Lungs: Clear bilaterally to aNo MRGuscultation and percussion. Heart: HRRR S1 S2, soft 1/6 systolic murmur..  Pulses are 2+ & equal.  No carotid bruit. No JVD.  No abdominal bruits. No femoral bruits. Abdomen: Bowel sounds are positive, abdomen distended, and non-tender without masses  Msk:  Back normal, normal gait. Normal strength and tone for age. Extremities: No clubbing, cyanosis 2+-3+ pretibial edema,. Diminished DP  Pulses in dependent position. Neuro: Alert and oriented X 3. Psych:  Good affect, responds appropriately  TELEMETRY: Reviewed telemetry pt in NSR rate in the 80's.  ASSESSMENT AND PLAN:  1. Acute on Chronic Diastolic CHF: Inadequte diuresis, was placed on oral Demadex by nephrology yesterday. We placed on IV lasix yesterday,but d/c'd by nephrology and replaced with PO demedex, with addition of metolazone. Diuresed 4000cc. Needs at least 15 lbs more of diureses.   2. Aortic regurgitation:  Appears to be at least moderate at this point, LV end-systolic dimension still only 40 mm without progression compared to previous study. Still concerned that this is leading to symptomatology and decompensation of diastolic heart failure.    3. Hypertension: Currently pressure is well-controlled on current medication regimen to include amlodipine and clonidine.   4. CKD: Creatinine 2.3  GFR 30.   Creatinine stable since 01/2013 and slightly improved with recent diuresis.  [name]. [name] NP [facility] 03/24/2013, 8:23 AM  Cardiology Attending Patient interviewed and examined. Discussed with Joni Reining, NP.  Above note annotated and modified based upon my findings.  Dry weight for patient appears to be 270-275. Renal function has improved with initial diuresis, which will be continued until an additional 10-15 pound fluid loss achieved. Echocardiogram reviewed. There is little change from previous study which demonstrated mildly impaired left ventricular systolic function and moderate aortic insufficiency without significant stenosis.  Patient will require cardiac catheterization, but not associated with this hospitalization. We need to optimize medical therapy and renal function prior to proceeding with angiographic studies.  [place] Bing, MD 03/24/2013, 6:16 PM

## 2013-03-24 NOTE — Progress Notes (Signed)
UR Chart Review Completed  

## 2013-03-24 NOTE — Progress Notes (Signed)
Subjective: Interval History: has no complaint of nausea or vomiting. Patient states that his breathing also has improved. Presently he doesn't offer any complaints..  Objective: Vital signs in last 24 hours: Temp:  [98.1 F (36.7 C)-98.5 F (36.9 C)] 98.1 F (36.7 C) (04/17 0528) Pulse Rate:  [78-83] 78 (04/17 0528) Resp:  [20] 20 (04/17 0528) BP: (119-125)/(50-55) 119/55 mmHg (04/17 0528) SpO2:  [98 %-100 %] 98 % (04/17 0528) Weight:  [128.6 kg (283 lb 8.2 oz)] 128.6 kg (283 lb 8.2 oz) (04/17 0528) Weight change: -1.3 kg (-2 lb 13.9 oz)  Intake/Output from previous day: 04/16 0701 - 04/17 0700 In: 1080 [P.O.:1080] Out: 5550 [Urine:5550] Intake/Output this shift:    General appearance: alert, cooperative and no distress Resp: diminished breath sounds bilaterally Cardio: regular rate and rhythm, S1, S2 normal, no murmur, click, rub or gallop GI: Obese, nontender and positive bowel sound Extremities: edema 2+ edema  Lab Results:  Recent Labs  03/23/13 0533  WBC 5.1  HGB 10.8*  HCT 33.2*  PLT 130*   BMET:  Recent Labs  03/23/13 0533 03/24/13 0505  NA 138 138  K 4.2 3.9  CL 101 101  CO2 28 27  GLUCOSE 75 76  BUN 49* 51*  CREATININE 2.43* 2.34*  CALCIUM 8.9 8.9   No results found for this basename: PTH,  in the last 72 hours Iron Studies: No results found for this basename: IRON, TIBC, TRANSFERRIN, FERRITIN,  in the last 72 hours  Studies/Results: No results found.  I have reviewed the patient's current medications.  Assessment/Plan: Problem #1 renal failure chronic his BUN is 51 creatinine is 2.34 remains stable.  Problem #2 CHF presently put back on Lasix with good urine output. Patient has also a Foley catheter. Problem #3 hypertension his blood pressure seems to be reasonably controlled Problem #4 metabolic bone disease calcium and phosphorus is stable Problem #5 secondary hyperparathyroidism secondary to chronic renal failure. Problem #6 sleep  apnea Problem #7 history of panhypopituitarism. Plan: We'll DC Lasix We'll start him on Demadex 50 mg once a day We'll add metolazone 2.5 mg by mouth twice a day. We'll start also on Rocaltrol 0.25 mg once a day Check his basic metabolic panel in the morning.   LOS: 5 days   Marny Smethers S 03/24/2013,7:43 AM

## 2013-03-24 NOTE — Progress Notes (Signed)
Subjective: Patient feels better. [name] diuresing better. Foley Catheter inserted.  Objective: Vital signs in last 24 hours: Temp:  [98.1 F (36.7 C)-98.5 F (36.9 C)] 98.1 F (36.7 C) (04/17 0528) Pulse Rate:  [78-83] 78 (04/17 0528) Resp:  [20] 20 (04/17 0528) BP: (119-125)/(50-55) 119/55 mmHg (04/17 0528) SpO2:  [98 %-100 %] 98 % (04/17 0528) Weight:  [128.6 kg (283 lb 8.2 oz)] 128.6 kg (283 lb 8.2 oz) (04/17 0528) Weight change: -1.3 kg (-2 lb 13.9 oz) Last BM Date: 03/21/13  Intake/Output from previous day: 04/16 0701 - 04/17 0700 In: 1080 [P.O.:1080] Out: 5550 [Urine:5550]  PHYSICAL EXAM General appearance: alert and no distress Resp: diminished breath sounds bilaterally and rhonchi bilaterally Cardio: S1, S2 normal GI: soft, non-tender; bowel sounds normal; no masses,  no organomegaly Extremities: extremities normal, atraumatic, no cyanosis or edema  Lab Results:    @labtest @ [facility] No results found for this basename: PHART, PCO2, PO2ART, TCO2, HCO3,  in the last 72 hours CULTURES Recent Results (from the past 240 hour(s))  MRSA PCR SCREENING     Status: Abnormal   Collection Time    03/19/13  5:35 PM      Result Value Range Status   MRSA by PCR POSITIVE (*) NEGATIVE Final   Comment: RESULT CALLED TO, READ BACK BY AND VERIFIED WITH:     CHILDRESS,J ON [id number] BY SMITH,N AT 1900                The GeneXpert MRSA Assay (FDA     approved for NASAL specimens     only), is one component of a     comprehensive MRSA colonization     surveillance program. It is not     intended to diagnose MRSA     infection nor to guide or     monitor treatment for     MRSA infections.   Studies/Results: No results found.  Medications: I have reviewed the patient's current medications.  Assesment: 1. Chest pain 2. Acute on chronic Diastolic CHF  3.COPD with exacerbation  4.Chronic renal failure 5. Sleep Apnea syndrome  6. Hypertension  7. Morbid Obesity 8.  Panhypopituitarism 9. H/O pituitary tumor and S/p surgical excision   Active Problems:   Congestive heart failure-predominantly right-sided   Hypertension   Chronic kidney disease   Sleep apnea    Plan: Will continue Iv diuretics  BMP in Am Continue regular treatment     LOS: 5 days   [name] [name] 03/24/2013, 7:53 AM

## 2013-03-25 ENCOUNTER — Inpatient Hospital Stay (HOSPITAL_COMMUNITY): Payer: Medicare Other

## 2013-03-25 LAB — GLUCOSE, CAPILLARY: Glucose-Capillary: 90 mg/dL (ref 70–99)

## 2013-03-25 LAB — BASIC METABOLIC PANEL
BUN: 52 mg/dL — ABNORMAL HIGH (ref 6–23)
CO2: 27 mEq/L (ref 19–32)
Chloride: 98 mEq/L (ref 96–112)
Glucose, Bld: 70 mg/dL (ref 70–99)
Potassium: 4.3 mEq/L (ref 3.5–5.1)

## 2013-03-25 LAB — CBC
HCT: 35.4 % — ABNORMAL LOW (ref 39.0–52.0)
Hemoglobin: 11.8 g/dL — ABNORMAL LOW (ref 13.0–17.0)
MCH: 29.6 pg (ref 26.0–34.0)
MCHC: 33.3 g/dL (ref 30.0–36.0)

## 2013-03-25 MED ORDER — TORSEMIDE 20 MG PO TABS
50.0000 mg | ORAL_TABLET | Freq: Every day | ORAL | Status: DC
  Administered 2013-03-26: 50 mg via ORAL
  Filled 2013-03-25: qty 3

## 2013-03-25 MED ORDER — TORSEMIDE 20 MG PO TABS
40.0000 mg | ORAL_TABLET | Freq: Once | ORAL | Status: AC
  Administered 2013-03-25: 40 mg via ORAL
  Filled 2013-03-25: qty 2

## 2013-03-25 MED ORDER — TORSEMIDE 20 MG PO TABS
20.0000 mg | ORAL_TABLET | Freq: Every day | ORAL | Status: DC
  Administered 2013-03-25: 20 mg via ORAL

## 2013-03-25 NOTE — Progress Notes (Signed)
SUBJECTIVE: Feels better and has no complaints of chest pressure.  Dyspnea resolved. Peripheral edema improved; however, decreased urine output noted by the patient.  Active Problems:   Congestive heart failure-predominantly right-sided   Hypertension   Chronic kidney disease   Sleep apnea  LABS: Basic Metabolic Panel:  Recent Labs  16/10/96 0505 03/25/13 0557  NA 138 136  K 3.9 4.3  CL 101 98  CO2 27 27  GLUCOSE 76 70  BUN 51* 52*  CREATININE 2.34* 2.31*  CALCIUM 8.9 9.5   CBC:  Recent Labs  03/23/13 0533 03/25/13 0557  WBC 5.1 6.4  HGB 10.8* 11.8*  HCT 33.2* 35.4*  MCV 90.7 88.9  PLT 130* 151   PHYSICAL EXAM BP 131/61  Pulse 81  Temp(Src) 98.1 F (36.7 C) (Oral)  Resp 20  Ht 6\' 1"  (1.854 m)  Wt 279 lb 15.8 oz (127 kg)  BMI 36.95 kg/m2  SpO2 98% General: Well developed, well nourished, in no acute distress; obese Head: Eyes PERRLA, No xanthomas.   Normal cephalic and atramatic  Lungs: Clear bilaterally to auscultation and percussion. Heart: HRRR S1 S2, No RG  Pulses are 2+ & equal. Grade 2/6 basilar systolic ejection murmur; grade 3/6 harsh diastolic decrescendo murmur at the lower left sternal border            No carotid bruit. Mild JVD.  No abdominal bruits. No femoral bruits. Abdomen: Bowel sounds are positive, abdomen soft and non-tender without masses Msk:  Back normal, normal gait. Normal strength and tone for age. Extremities: No clubbing, cyanosis , 1-2+  edema.  DP +1 Neuro: Alert and oriented X 3. Psych:  Good affect, responds appropriately  TELEMETRY: Reviewed telemetry pt in SR with 1 AV block.  ASSESSMENT AND PLAN: 1. Acute on Chronic Diastolic CHF: Feeling much better with less edema and improved breathing. Still with some abdominal distention, but much softer. Diuresed additional 5700cc over night, but weight decreased only 1 kg.  Will d/c foley catheter. Plan for discharge within the next few days.  2. Aortic regurgitation: Appears to be  at least moderate at this point, LV end-systolic dimension still only 40 mm without progression compared to previous study. Still concerned that this is leading to symptomatology and decompensation of diastolic heart failure. Follow up in the office post discharge to make further recommendations. Appt scheduled for Apr 07, 2013.  3. Hypertension: Currently pressure is well-controlled on current medication regimen to include amlodipine and clonidine.   4. CKD: Creatinine 2.3 GFR 30. Creatinine stable since 01/2013 and slightly improved with recent diuresis.  [name]. [name] NP [facility] 03/25/2013, 9:32 AM  Cardiology Attending Patient interviewed and examined. Discussed with Joni Reining, NP.  Above note annotated and modified based upon my findings.  Patient continues to improve with diuresis.  Diuretic prior to admission unclear, but included furosemide 60 mg twice a day at a minimum along with hydrochlorothiazide. Torsemide dose will be increased to 50 mg a day to promote continuing diuresis.  CHF will be reassessed with a chest x-ray and BNP level. Patient can be discharged over the weekend if CHF optimally controlled.  [place] Bing, MD 03/25/2013, 4:27 PM

## 2013-03-25 NOTE — Progress Notes (Signed)
Subjective: Patient feels better. [name] diuresing well. No new complaint.  Objective: Vital signs in last 24 hours: Temp:  [98.1 F (36.7 C)] 98.1 F (36.7 C) (04/18 0430) Pulse Rate:  [81] 81 (04/18 0430) Resp:  [20] 20 (04/18 0430) BP: (125-131)/(56-61) 131/61 mmHg (04/18 0430) SpO2:  [96 %-98 %] 98 % (04/18 0726) Weight change:  Last BM Date: 03/23/13  Intake/Output from previous day: 04/17 0701 - 04/18 0700 In: 720 [P.O.:720] Out: 6500 [Urine:6500]  PHYSICAL EXAM General appearance: alert and no distress Resp: diminished breath sounds bilaterally and rhonchi bilaterally Cardio: S1, S2 normal GI: soft, non-tender; bowel sounds normal; no masses,  no organomegaly Extremities: extremities normal, atraumatic, no cyanosis or edema  Lab Results:    @labtest @ [facility] No results found for this basename: PHART, PCO2, PO2ART, TCO2, HCO3,  in the last 72 hours CULTURES Recent Results (from the past 240 hour(s))  MRSA PCR SCREENING     Status: Abnormal   Collection Time    03/19/13  5:35 PM      Result Value Range Status   MRSA by PCR POSITIVE (*) NEGATIVE Final   Comment: RESULT CALLED TO, READ BACK BY AND VERIFIED WITH:     CHILDRESS,J ON [id number] BY SMITH,N AT 1900                The GeneXpert MRSA Assay (FDA     approved for NASAL specimens     only), is one component of a     comprehensive MRSA colonization     surveillance program. It is not     intended to diagnose MRSA     infection nor to guide or     monitor treatment for     MRSA infections.   Studies/Results: No results found.  Medications: I have reviewed the patient's current medications.  Assesment: 1. Chest pain 2. Acute on chronic Diastolic CHF  3.COPD with exacerbation  4.Chronic renal failure 5. Sleep Apnea syndrome  6. Hypertension  7. 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

## 2013-03-25 NOTE — Progress Notes (Signed)
Subjective: Interval History: has no complaint of difficulty in breathing. Patient states that he's feeling better..  Objective: Vital signs in last 24 hours: Temp:  [98.1 F (36.7 C)] 98.1 F (36.7 C) (04/18 0430) Pulse Rate:  [81] 81 (04/18 0430) Resp:  [20] 20 (04/18 0430) BP: (125-131)/(56-61) 131/61 mmHg (04/18 0430) SpO2:  [96 %-98 %] 98 % (04/18 0726) Weight change:   Intake/Output from previous day: 04/17 0701 - 04/18 0700 In: 960 [P.O.:960] Out: 6500 [Urine:6500] Intake/Output this shift:    General appearance: alert, cooperative and no distress Resp: diminished breath sounds bilaterally Cardio: regular rate and rhythm, S1, S2 normal, no murmur, click, rub or gallop GI: soft, non-tender; bowel sounds normal; no masses,  no organomegaly Extremities: 1+ edema  Lab Results:  Recent Labs  03/23/13 0533 03/25/13 0557  WBC 5.1 6.4  HGB 10.8* 11.8*  HCT 33.2* 35.4*  PLT 130* 151   BMET:  Recent Labs  03/24/13 0505 03/25/13 0557  NA 138 136  K 3.9 4.3  CL 101 98  CO2 27 27  GLUCOSE 76 70  BUN 51* 52*  CREATININE 2.34* 2.31*  CALCIUM 8.9 9.5   No results found for this basename: PTH,  in the last 72 hours Iron Studies: No results found for this basename: IRON, TIBC, TRANSFERRIN, FERRITIN,  in the last 72 hours  Studies/Results: No results found.  I have reviewed the patient's current medications.  Assessment/Plan: Problem #1 renal failure chronic BUN is 52 and creatinine is 2.31 renal function at this moment remains stable. Problem #2 anasarca patient is on Demadex and metolazone combination. Patient had about 6 L of urine and improving. Problem #3 hypertension blood pressure seems to be reasonably controlled   Problem #4 sleep apnea Problem # anemia his hemoglobin is 11.8 hematocrit 35.4 stable. Problem #6 obesity Problem #7 history of panhypopituitarism. Plan: Decrease Demadex to 20 mg once a day Continue with metolazone with present dose. If  her patient is quite to discharged we'll follow him as an outpatient.     LOS: 6 days   Amandalee Lacap S 03/25/2013,9:30 AM

## 2013-03-26 LAB — PRO B NATRIURETIC PEPTIDE: Pro B Natriuretic peptide (BNP): 3180 pg/mL — ABNORMAL HIGH (ref 0–125)

## 2013-03-26 LAB — BASIC METABOLIC PANEL
Calcium: 9.7 mg/dL (ref 8.4–10.5)
GFR calc Af Amer: 27 mL/min — ABNORMAL LOW (ref >90)
GFR calc non Af Amer: 24 mL/min — ABNORMAL LOW (ref >90)
Glucose, Bld: 96 mg/dL (ref 70–99)
Potassium: 4.4 mEq/L (ref 3.5–5.1)
Sodium: 133 mEq/L — ABNORMAL LOW (ref 135–145)

## 2013-03-26 LAB — GLUCOSE, CAPILLARY
Glucose-Capillary: 111 mg/dL — ABNORMAL HIGH (ref 70–99)
Glucose-Capillary: 94 mg/dL (ref 70–99)

## 2013-03-26 MED ORDER — LORATADINE 10 MG PO TABS: 10.0000 mg | ORAL_TABLET | Freq: Every day | ORAL | Status: DC | PRN

## 2013-03-26 MED ORDER — VITAMIN D (ERGOCALCIFEROL) 1.25 MG (50000 UNIT) PO CAPS
50000.0000 [IU] | ORAL_CAPSULE | ORAL | Status: DC
  Administered 2013-03-26: 50000 [IU] via ORAL
  Filled 2013-03-26: qty 1

## 2013-03-26 NOTE — Progress Notes (Signed)
Subjective: Interval History: has no complaint of difficulty in breathing. Patient denies any nausea or vomiting..  Objective: Vital signs in last 24 hours: Temp:  [97.8 F (36.6 C)-98.2 F (36.8 C)] 98 F (36.7 C) (04/19 0500) Pulse Rate:  [76-82] 78 (04/19 0500) Resp:  [19-20] 19 (04/19 0500) BP: (122-145)/(44-69) 145/69 mmHg (04/19 0500) SpO2:  [95 %-98 %] 98 % (04/19 0712) Weight:  [124 kg (273 lb 5.9 oz)-126.191 kg (278 lb 3.2 oz)] 124 kg (273 lb 5.9 oz) (04/19 0500) Weight change: -0.809 kg (-1 lb 12.6 oz)  Intake/Output from previous day: 04/18 0701 - 04/19 0700 In: 486 [P.O.:480; I.V.:6] Out: 4450 [Urine:4450] Intake/Output this shift:    General appearance: alert, cooperative and no distress Resp: clear to auscultation bilaterally Cardio: regular rate and rhythm, S1, S2 normal, no murmur, click, rub or gallop GI: soft, non-tender; bowel sounds normal; no masses,  no organomegaly Extremities: edema Trace edema  Lab Results:  Recent Labs  03/25/13 0557  WBC 6.4  HGB 11.8*  HCT 35.4*  PLT 151   BMET:  Recent Labs  03/25/13 0557 03/26/13 0640  NA 136 133*  K 4.3 4.4  CL 98 91*  CO2 27 29  GLUCOSE 70 96  BUN 52* 59*  CREATININE 2.31* 2.60*  CALCIUM 9.5 9.7   No results found for this basename: PTH,  in the last 72 hours Iron Studies: No results found for this basename: IRON, TIBC, TRANSFERRIN, FERRITIN,  in the last 72 hours  Studies/Results: Dg Chest 2 View  03/25/2013  *RADIOLOGY REPORT*  Clinical Data: CHF.  CHEST - 2 VIEW  Comparison: 03/19/2013  Findings: Cardiomegaly. There is hyperinflation of the lungs compatible with COPD.  Small right pleural effusion with right base atelectasis.  No edema.  No focal opacity on the left.  No acute bony abnormality.  IMPRESSION: COPD.  Cardiomegaly.  Small right pleural effusion.   Original Report Authenticated By: Charlett Nose, M.D.     I have reviewed the patient's current  medications.  Assessment/Plan: Problem #1 chronic renal failure his BUN5 9 and creatinine is 2.6 seems to be increasing. Most likely secondary to fluid removal. Problem #2 CHF/anasarca presently he is on Demadex and metolazone combination he has a good urine output. His edema has improved. Patient presently is a symptomatic.  well problem #3 hypertension his blood pressure seems to be reasonably controlled Problem #4 sleep apnea Problem #5 metabolic bone disease calcium and phosphorus was in acceptable range. Problem #6 vitamin D deficiency. His vitamin D level is 11. Problem #7 history of seizure disorder Problem #8 anemia his hemoglobin and hematocrit is stable. Plan: We'll continue his Demadex We'll DC metolazone. Patient could be discharged on Demadex only. Will start patient ErgoCalciferol 50,000 units by mouth once a week for 3 months.    LOS: 7 days   Renay Crammer S 03/26/2013,10:00 AM

## 2013-03-26 NOTE — Progress Notes (Signed)
Subjective: He feels better and wants to go home. However his renal function is a little worse. Objective: Vital signs in last 24 hours: Temp:  [97.8 F (36.6 C)-98.2 F (36.8 C)] 98 F (36.7 C) (04/19 0500) Pulse Rate:  [76-82] 78 (04/19 0500) Resp:  [19-20] 19 (04/19 0500) BP: (122-145)/(44-69) 145/69 mmHg (04/19 0500) SpO2:  [95 %-98 %] 98 % (04/19 0712) Weight:  [124 kg (273 lb 5.9 oz)-126.191 kg (278 lb 3.2 oz)] 124 kg (273 lb 5.9 oz) (04/19 0500) Weight change: -0.809 kg (-1 lb 12.6 oz) Last BM Date: 03/24/13  Intake/Output from previous day: 04/18 0701 - 04/19 0700 In: 486 [P.O.:480; I.V.:6] Out: 4450 [Urine:4450]  PHYSICAL EXAM General appearance: alert, cooperative and no distress Resp: clear to auscultation bilaterally Cardio: regular rate and rhythm, S1, S2 normal, no murmur, click, rub or gallop GI: soft, non-tender; bowel sounds normal; no masses,  no organomegaly Extremities: venous stasis dermatitis noted  Lab Results:    Basic Metabolic Panel:  Recent Labs  09/81/19 0557 03/26/13 0640  NA 136 133*  K 4.3 4.4  CL 98 91*  CO2 27 29  GLUCOSE 70 96  BUN 52* 59*  CREATININE 2.31* 2.60*  CALCIUM 9.5 9.7   Liver Function Tests: No results found for this basename: AST, ALT, ALKPHOS, BILITOT, PROT, ALBUMIN,  in the last 72 hours No results found for this basename: LIPASE, AMYLASE,  in the last 72 hours No results found for this basename: AMMONIA,  in the last 72 hours CBC:  Recent Labs  03/25/13 0557  WBC 6.4  HGB 11.8*  HCT 35.4*  MCV 88.9  PLT 151   Cardiac Enzymes: No results found for this basename: CKTOTAL, CKMB, CKMBINDEX, TROPONINI,  in the last 72 hours BNP:  Recent Labs  03/26/13 0640  PROBNP 3180.0*   D-Dimer: No results found for this basename: DDIMER,  in the last 72 hours CBG:  Recent Labs  03/24/13 1137 03/24/13 1625 03/24/13 2119 03/25/13 1651 03/25/13 2225 03/26/13 0749  GLUCAP 109* 107* 184* 90 138* 111*    Hemoglobin A1C: No results found for this basename: HGBA1C,  in the last 72 hours Fasting Lipid Panel: No results found for this basename: CHOL, HDL, LDLCALC, TRIG, CHOLHDL, LDLDIRECT,  in the last 72 hours Thyroid Function Tests: No results found for this basename: TSH, T4TOTAL, FREET4, T3FREE, THYROIDAB,  in the last 72 hours Anemia Panel: No results found for this basename: VITAMINB12, FOLATE, FERRITIN, TIBC, IRON, RETICCTPCT,  in the last 72 hours Coagulation: No results found for this basename: LABPROT, INR,  in the last 72 hours Urine Drug Screen: Drugs of Abuse  No results found for this basename: labopia, cocainscrnur, labbenz, amphetmu, thcu, labbarb    Alcohol Level: No results found for this basename: ETH,  in the last 72 hours Urinalysis: No results found for this basename: COLORURINE, APPERANCEUR, LABSPEC, PHURINE, GLUCOSEU, HGBUR, BILIRUBINUR, KETONESUR, PROTEINUR, UROBILINOGEN, NITRITE, LEUKOCYTESUR,  in the last 72 hours Misc. Labs:  ABGS No results found for this basename: PHART, PCO2, PO2ART, TCO2, HCO3,  in the last 72 hours CULTURES Recent Results (from the past 240 hour(s))  MRSA PCR SCREENING     Status: Abnormal   Collection Time    03/19/13  5:35 PM      Result Value Range Status   MRSA by PCR POSITIVE (*) NEGATIVE Final   Comment: RESULT CALLED TO, READ BACK BY AND VERIFIED WITH:     CHILDRESS,J ON [id number] BY SMITH,N AT  1900                The GeneXpert MRSA Assay (FDA     approved for NASAL specimens     only), is one component of a     comprehensive MRSA colonization     surveillance program. It is not     intended to diagnose MRSA     infection nor to guide or     monitor treatment for     MRSA infections.   Studies/Results: Dg Chest 2 View  03/25/2013  *RADIOLOGY REPORT*  Clinical Data: CHF.  CHEST - 2 VIEW  Comparison: 03/19/2013  Findings: Cardiomegaly. There is hyperinflation of the lungs compatible with COPD.  Small right pleural  effusion with right base atelectasis.  No edema.  No focal opacity on the left.  No acute bony abnormality.  IMPRESSION: COPD.  Cardiomegaly.  Small right pleural effusion.   Original Report Authenticated By: Charlett Nose, M.D.     Medications:  Prior to Admission:  Prescriptions prior to admission  Medication Sig Dispense Refill  . albuterol (PROVENTIL) (2.5 MG/3ML) 0.083% nebulizer solution Take 3 mLs (2.5 mg total) by nebulization every 6 (six) hours as needed for wheezing.  75 mL  12  . amLODipine (NORVASC) 5 MG tablet Take 1 tablet (5 mg total) by mouth daily.  30 tablet  6  . budesonide-formoterol (SYMBICORT) 160-4.5 MCG/ACT inhaler Inhale 2 puffs into the lungs 2 (two) times daily.      . cloNIDine (CATAPRES) 0.1 MG tablet Take 0.1 mg by mouth 3 (three) times daily.      Marland Kitchen desmopressin (DDAVP) 0.2 MG tablet Take 0.2 mg by mouth 2 (two) times daily.      Marland Kitchen dexamethasone (DECADRON) 0.5 MG tablet Take 0.5 mg by mouth daily.      . furosemide (LASIX) 40 MG tablet Take 1.5 tablets (60 mg total) by mouth 2 (two) times daily.  45 tablet  3  . levothyroxine (SYNTHROID, LEVOTHROID) 150 MCG tablet Take 150 mcg by mouth daily.      Marland Kitchen linagliptin (TRADJENTA) 5 MG TABS tablet Take 5 mg by mouth daily.      Marland Kitchen olmesartan-hydrochlorothiazide (BENICAR HCT) 40-25 MG per tablet Take 1 tablet by mouth daily.      . phenytoin (DILANTIN) 100 MG ER capsule Take 200-300 mg by mouth daily. Takes 200 mg on Monday and Thursdsay. On all other days of the week takes 300 mg.      . simvastatin (ZOCOR) 80 MG tablet Take 80 mg by mouth daily.       Marland Kitchen tiotropium (SPIRIVA) 18 MCG inhalation capsule Place 18 mcg into inhaler and inhale daily.      Marland Kitchen torsemide (DEMADEX) 100 MG tablet Take 50 mg by mouth as directed. 0.5  Tablet daily. Weigh yourself daily and if your weight goes up three pounds take another 0.5 tab       Scheduled: . amLODipine  5 mg Oral Daily  . atorvastatin  40 mg Oral q1800  . budesonide-formoterol   2 puff Inhalation BID  . cloNIDine  0.1 mg Oral TID  . desmopressin  0.2 mg Oral BID  . dexamethasone  0.5 mg Oral Daily  . enoxaparin (LOVENOX) injection  60 mg Subcutaneous Q24H  . levothyroxine  150 mcg Oral QAC breakfast  . linagliptin  5 mg Oral Daily  . phenytoin  200 mg Oral Custom  . phenytoin  300 mg Oral Custom  . sodium chloride  3 mL Intravenous Q12H  . tiotropium  18 mcg Inhalation Daily  . torsemide  50 mg Oral Daily  . Vitamin D (Ergocalciferol)  50,000 Units Oral Q7 days   Continuous:  XBJ:[id number] chloride, albuterol, alum & mag hydroxide-simeth, sodium chloride, sorbitol  Assesment:he has CHF and has renal failure. His renal function has deteriorated somewhat. Active Problems:   Congestive heart failure-predominantly right-sided   Hypertension   Chronic kidney disease   Sleep apnea    Plan:i will decrease his diuretics and recheck renal function tommorow    LOS: 7 days   Camora Tremain L 03/26/2013, 10:16 AM

## 2013-03-27 DIAGNOSIS — G40909 Epilepsy, unspecified, not intractable, without status epilepticus: Secondary | ICD-10-CM | POA: Diagnosis present

## 2013-03-27 DIAGNOSIS — E119 Type 2 diabetes mellitus without complications: Secondary | ICD-10-CM | POA: Diagnosis present

## 2013-03-27 DIAGNOSIS — I200 Unstable angina: Secondary | ICD-10-CM | POA: Diagnosis present

## 2013-03-27 DIAGNOSIS — E876 Hypokalemia: Secondary | ICD-10-CM | POA: Diagnosis present

## 2013-03-27 LAB — BASIC METABOLIC PANEL
BUN: 69 mg/dL — ABNORMAL HIGH (ref 6–23)
Calcium: 9.5 mg/dL (ref 8.4–10.5)
GFR calc Af Amer: 25 mL/min — ABNORMAL LOW (ref >90)
GFR calc non Af Amer: 21 mL/min — ABNORMAL LOW (ref >90)
Glucose, Bld: 111 mg/dL — ABNORMAL HIGH (ref 70–99)

## 2013-03-27 MED ORDER — TORSEMIDE 20 MG PO TABS: 40.0000 mg | ORAL_TABLET | Freq: Every day | ORAL | Status: DC

## 2013-03-27 MED ORDER — TORSEMIDE 20 MG PO TABS
40.0000 mg | ORAL_TABLET | Freq: Every day | ORAL | Status: DC
  Administered 2013-03-27: 40 mg via ORAL
  Filled 2013-03-27: qty 2

## 2013-03-27 NOTE — Discharge Summary (Signed)
Physician Discharge Summary  Patient ID: [name] [name] MRN: [id number] DOB/AGE: February 20, 1946 67 y.o. Primary Care Physician:BEFEKADU,BELAYENH S, MD Admit date: 03/19/2013 Discharge date: 03/27/2013    Discharge Diagnoses:   Principal Problem:   Unstable angina pectoris Active Problems:   Congestive heart failure-predominantly right-sided   Hypertension   Chronic kidney disease   Sleep apnea   Hypothyroid   Panhypopituitarism   Seizure disorder   Diabetes   Acute on chronic diastolic congestive heart failure     Medication List    STOP taking these medications       furosemide 40 MG tablet  Commonly known as:  LASIX      TAKE these medications       albuterol (2.5 MG/3ML) 0.083% nebulizer solution  Commonly known as:  PROVENTIL  Take 3 mLs (2.5 mg total) by nebulization every 6 (six) hours as needed for wheezing.     amLODipine 5 MG tablet  Commonly known as:  NORVASC  Take 1 tablet (5 mg total) by mouth daily.     budesonide-formoterol 160-4.5 MCG/ACT inhaler  Commonly known as:  SYMBICORT  Inhale 2 puffs into the lungs 2 (two) times daily.     cloNIDine 0.1 MG tablet  Commonly known as:  CATAPRES  Take 0.1 mg by mouth 3 (three) times daily.     desmopressin 0.2 MG tablet  Commonly known as:  DDAVP  Take 0.2 mg by mouth 2 (two) times daily.     dexamethasone 0.5 MG tablet  Commonly known as:  DECADRON  Take 0.5 mg by mouth daily.     levothyroxine 150 MCG tablet  Commonly known as:  SYNTHROID, LEVOTHROID  Take 150 mcg by mouth daily.     linagliptin 5 MG Tabs tablet  Commonly known as:  TRADJENTA  Take 5 mg by mouth daily.     olmesartan-hydrochlorothiazide 40-25 MG per tablet  Commonly known as:  BENICAR HCT  Take 1 tablet by mouth daily.     phenytoin 100 MG ER capsule  Commonly known as:  DILANTIN  Take 200-300 mg by mouth daily. Takes 200 mg on Monday and Thursdsay. On all other days of the week takes 300 mg.     simvastatin 80 MG tablet   Commonly known as:  ZOCOR  Take 80 mg by mouth daily.     tiotropium 18 MCG inhalation capsule  Commonly known as:  SPIRIVA  Place 18 mcg into inhaler and inhale daily.     torsemide 20 MG tablet  Commonly known as:  DEMADEX  Take 2 tablets (40 mg total) by mouth daily. 0.5  Tablet daily. Weigh yourself daily and if your weight goes up three pounds take another 0.5 tab        Discharged Condition: Improved    Consults: Cardiology/nephrology  Significant Diagnostic Studies: Dg Chest 2 View  03/25/2013  *RADIOLOGY REPORT*  Clinical Data: CHF.  CHEST - 2 VIEW  Comparison: 03/19/2013  Findings: Cardiomegaly. There is hyperinflation of the lungs compatible with COPD.  Small right pleural effusion with right base atelectasis.  No edema.  No focal opacity on the left.  No acute bony abnormality.  IMPRESSION: COPD.  Cardiomegaly.  Small right pleural effusion.   Original Report Authenticated By: Charlett Nose, M.D.    Dg Chest 2 View  03/19/2013  *RADIOLOGY REPORT*  Clinical Data: Chest pain.  Shortness of breath.  Prior history of CHF.  Smoker.  CHEST - 2 VIEW  Comparison: Two-view chest x-ray  01/11/2013, 06/01/2012.  Findings: Lateral image blurred by respiratory motion.  Cardiac silhouette markedly enlarged but stable.  Thoracic aorta tortuous atherosclerotic, unchanged.  Hilar and mediastinal contours otherwise unremarkable.  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  Mild diffuse interstitial pulmonary edema.  Small right pleural effusion.  No visible left pleural effusion.  Degenerative changes involving the lumbar spine.  IMPRESSION: Mild CHF superimposed upon COPD/emphysema.   Original Report Authenticated By: Hulan Saas, M.D.     Lab Results: Basic Metabolic Panel:  Recent Labs  96/04/54 0640 03/27/13 0715  NA 133* 131*  K 4.4 4.1  CL 91* 90*  CO2 29 30  GLUCOSE 96 111*  BUN 59* 69*  CREATININE 2.60* 2.82*  CALCIUM 9.7 9.5   Liver Function Tests: No  results found for this basename: AST, ALT, ALKPHOS, BILITOT, PROT, ALBUMIN,  in the last 72 hours   CBC:  Recent Labs  03/25/13 0557  WBC 6.4  HGB 11.8*  HCT 35.4*  MCV 88.9  PLT 151    Recent Results (from the past 240 hour(s))  MRSA PCR SCREENING     Status: Abnormal   Collection Time    03/19/13  5:35 PM      Result Value Range Status   MRSA by PCR POSITIVE (*) NEGATIVE Final   Comment: RESULT CALLED TO, READ BACK BY AND VERIFIED WITH:     CHILDRESS,J ON [id number] BY SMITH,N AT 1900                The GeneXpert MRSA Assay (FDA     approved for NASAL specimens     only), is one component of a     comprehensive MRSA colonization     surveillance program. It is not     intended to diagnose MRSA     infection nor to guide or     monitor treatment for     MRSA infections.     Hospital Course: This is a 67 year old who has multiple medical problems as listed and 2 came to the emergency room with increasing shortness of breath and chest discomfort. Chest x-ray shows he has CHF and COPD. He has chronic renal failure. He was started on nitroglycerin and heparin and given Lasix and improved. His nitroglycerin and heparin were stopped and he continued on diuresis. His renal function has fluctuated somewhat and on the day of discharge his creatinine is 2.8. He has diuresed significantly and his weight is down by 13 pounds from admission. [name] able to cannulate in the halls and feels much improved and is ready for discharge. He has not had any chest pain. His shortness of breath is at baseline. I discussed his situation with Dr. Kristian Covey his nephrologist and it is felt that his creatinine could be permitted to rise somewhat as long as he is able to stay out of heart failure with his diuresis  Discharge Exam: Blood pressure 127/42, pulse 71, temperature 98 F (36.7 C), temperature source Oral, resp. rate 20, height 6\' 1"  (1.854 m), weight 122.8 kg (270 lb 11.6 oz), SpO2 95.00%. [name] awake  and alert. [name] obese. His chest is clear. He does not have a gallop. He has chronic venous stasis.  Disposition: Home with home health services      Discharge Orders   Future Appointments Provider Department Dept Phone   04/07/2013 1:00 PM Jodelle Gross, NP [facility] [place] [phone]   Future Orders Complete By Expires  Discharge patient  As directed     Face-to-face encounter (required for Medicare/Medicaid patients)  As directed     Comments:      I Dillin Lofgren L certify that this patient is under my care and that I, or a nurse practitioner or physician's assistant working with me, had a face-to-face encounter that meets the physician face-to-face encounter requirements with this patient on 03/27/2013. The encounter with the patient was in whole, or in part for the following medical condition(s) which is the primary reason for home health care (List medical condition):chf/crf    Questions:      The encounter with the patient was in whole, or in part, for the following medical condition, which is the primary reason for home health care:  chf/crf    I certify that, based on my findings, the following services are medically necessary home health services:  Nursing    Physical therapy    My clinical findings support the need for the above services:  Shortness of breath with activity    Further, I certify that my clinical findings support that this patient is homebound due to:  Shortness of Breath with activity    Reason for Medically Necessary Home Health Services:  Skilled Nursing- Changes in Medication/Medication Management    Home Health  As directed     Scheduling Instructions:      BMET on 03/29/2013    Questions:      To provide the following care/treatments:  PT    RN       Follow-up Information   Follow up with [facility] S, MD In 4 weeks.   Contact information:   [address] [place] [address] [phone]       Follow up  with Joni Reining, NP On 04/07/2013. (1 pm.)    Contact information:   [address] [place] [address] [phone]       Follow up with [facility].   Contact information:   [address] [place] [address] [phone]      Signed: Fredirick Maudlin Pager [phone]  03/27/2013, 11:23 AM

## 2013-03-27 NOTE — Progress Notes (Signed)
Pt. D/c to home with [facility], IV removed, d/c instructions provided, medications reviewed.

## 2013-03-27 NOTE — Progress Notes (Signed)
Subjective: Interval History: has no complaint of difficulty in breathing. Patient denies any orthopnea or paroxysmal nocturnal dyspnea. His appetite is good.  Objective: Vital signs in last 24 hours: Temp:  [97.4 F (36.3 C)-98.3 F (36.8 C)] 98 F (36.7 C) (04/20 0500) Pulse Rate:  [71-76] 71 (04/20 0500) Resp:  [20] 20 (04/20 0500) BP: (106-129)/(41-57) 127/42 mmHg (04/20 0500) SpO2:  [95 %-98 %] 95 % (04/20 0703) Weight:  [122.8 kg (270 lb 11.6 oz)] 122.8 kg (270 lb 11.6 oz) (04/20 0500) Weight change: -3.391 kg (-7 lb 7.6 oz)  Intake/Output from previous day: 04/19 0701 - 04/20 0700 In: 243 [P.O.:240; I.V.:3] Out: 3675 [Urine:3675] Intake/Output this shift:    General appearance: alert, cooperative and no distress Resp: clear to auscultation bilaterally Cardio: regular rate and rhythm, S1, S2 normal, no murmur, click, rub or gallop GI: soft, non-tender; bowel sounds normal; no masses,  no organomegaly Extremities: edema trace edema bilaterally.  Lab Results:  Recent Labs  03/25/13 0557  WBC 6.4  HGB 11.8*  HCT 35.4*  PLT 151   BMET:  Recent Labs  03/26/13 0640 03/27/13 0715  NA 133* 131*  K 4.4 4.1  CL 91* 90*  CO2 29 30  GLUCOSE 96 111*  BUN 59* 69*  CREATININE 2.60* 2.82*  CALCIUM 9.7 9.5   No results found for this basename: PTH,  in the last 72 hours Iron Studies: No results found for this basename: IRON, TIBC, TRANSFERRIN, FERRITIN,  in the last 72 hours  Studies/Results: Dg Chest 2 View  03/25/2013  *RADIOLOGY REPORT*  Clinical Data: CHF.  CHEST - 2 VIEW  Comparison: 03/19/2013  Findings: Cardiomegaly. There is hyperinflation of the lungs compatible with COPD.  Small right pleural effusion with right base atelectasis.  No edema.  No focal opacity on the left.  No acute bony abnormality.  IMPRESSION: COPD.  Cardiomegaly.  Small right pleural effusion.   Original Report Authenticated By: Charlett Nose, M.D.     I have reviewed the patient's current  medications.  Assessment/Plan: Problem #1 renal failure chronic and stage IV BUN is 69 creatinine is 2.82 renal function seems to be slightly increasing. This is most likely from fluid removal. Problem #2 anasarca patient on Demadex is still he has a reasonable urine output and he said Kara Mead has improved. Problem #2 anemia his hemoglobin is 11.8 hematocrit 35.4 stable Problem #4 hypertension his blood pressure is reasonably controlled Problem #5 vitamin D deficiency presently started on replacement Problem #6 metabolic bone disease calcium and phosphorus is was in acceptable range Problem #7 hypopituitarism  Plan: We'll continue his Demadex Will follow patient as an outpatient when is going to be discharged.   LOS: 8 days   Slayton Lubitz S 03/27/2013,8:35 AM

## 2013-03-27 NOTE — Progress Notes (Signed)
He says he feels okay and wants to go home. [name] not short of breath. He feels like his legs are better.  Exam shows a obese male who is in no acute distress. His temperature 90.8 pulse 71 respirations 20 blood pressure 127/42 and O2 saturation 95%. His chest is relatively clear. He has chronic venous stasis in his legs but looks much better. His BUN is 69 creatinine 2.82  I reviewed his medications  I discussed his situation with Dr. Kristian Covey his nephrologist. Dr. Kristian Covey says he feels that he is okay for discharge on a somewhat reduced dose of diuretic. He feels that his creatinine could go as high as 3.3 if needed to keep him out of CHF. I will plan to discharge with home health services

## 2013-03-28 LAB — GLUCOSE, CAPILLARY: Glucose-Capillary: 95 mg/dL (ref 70–99)

## 2013-04-07 ENCOUNTER — Encounter: Payer: Self-pay | Admitting: Adult Health

## 2013-04-07 ENCOUNTER — Ambulatory Visit (INDEPENDENT_AMBULATORY_CARE_PROVIDER_SITE_OTHER): Payer: Medicare Other | Admitting: Adult Health

## 2013-04-07 VITALS — BP 123/51 | Wt 270.1 lb

## 2013-04-07 DIAGNOSIS — I10 Essential (primary) hypertension: Secondary | ICD-10-CM

## 2013-04-07 DIAGNOSIS — I359 Nonrheumatic aortic valve disorder, unspecified: Secondary | ICD-10-CM

## 2013-04-07 DIAGNOSIS — I5033 Acute on chronic diastolic (congestive) heart failure: Secondary | ICD-10-CM

## 2013-04-07 DIAGNOSIS — I509 Heart failure, unspecified: Secondary | ICD-10-CM

## 2013-04-07 NOTE — Assessment & Plan Note (Signed)
Blood pressure is well controlled presently. No changes in his management at this time.

## 2013-04-07 NOTE — Assessment & Plan Note (Signed)
[name] currently well compensated with stable weight today. He shows no signs of fluid overload. Discussed with Dr.Rothbart need to proceed with cardiac cath to evaluate AoV. With CKD this may be undesirable.

## 2013-04-07 NOTE — Patient Instructions (Signed)
   Your physician recommends that you have lab work today:  [facility]  Your physician recommends that you schedule a follow-up appointment in: 1 month with Dr. Dietrich Pates  Your physician recommends that you continue on your current medications as directed. Please refer to the Current Medication list given to you today.

## 2013-04-07 NOTE — Assessment & Plan Note (Signed)
Patient has been seen by Dr.Rothbart who is not in favor of cardiac cath or AoV repair.  However, he will continue to have exacerbations of CHF. Dr. Dietrich Pates will talk to Dr.Cooper to discuss referral for TAVI. He will see the patient in one month. BMET will be ordered today.
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
## 2013-04-08 LAB — BASIC METABOLIC PANEL
Calcium: 9.7 mg/dL (ref 8.4–10.5)
Potassium: 4.2 mEq/L (ref 3.5–5.3)
Sodium: 137 mEq/L (ref 135–145)

## 2013-04-09 NOTE — Progress Notes (Signed)
Patient ID: [name] [name], male   DOB: Dec 03, 1946, 67 y.o.   MRN: [id number]  Patient interviewed and examined. Case discussed with  Joni Reining, NP.  Cardiac catheterization followed by aortic valve replacement surgery is a very daunting undertaking in this gentleman with multiple medical problems. I will discuss the possibility of transcutaneous aortic valve replacement in a patient with aortic insufficiency with our TAVR team. If this is currently not permitted, we will need to consider proceeding with cardiac catheterization and aortic valve replacement, if indicated.  Haledon Bing, MD

## 2013-05-13 ENCOUNTER — Encounter: Payer: Self-pay | Admitting: Cardiology

## 2013-05-13 ENCOUNTER — Ambulatory Visit (INDEPENDENT_AMBULATORY_CARE_PROVIDER_SITE_OTHER): Payer: Medicare Other | Admitting: Cardiology

## 2013-05-13 VITALS — BP 128/56 | HR 82 | Ht 73.0 in | Wt 272.0 lb

## 2013-05-13 DIAGNOSIS — E039 Hypothyroidism, unspecified: Secondary | ICD-10-CM

## 2013-05-13 DIAGNOSIS — I359 Nonrheumatic aortic valve disorder, unspecified: Secondary | ICD-10-CM

## 2013-05-13 DIAGNOSIS — I10 Essential (primary) hypertension: Secondary | ICD-10-CM

## 2013-05-13 DIAGNOSIS — I509 Heart failure, unspecified: Secondary | ICD-10-CM

## 2013-05-13 DIAGNOSIS — E782 Mixed hyperlipidemia: Secondary | ICD-10-CM

## 2013-05-13 DIAGNOSIS — I5033 Acute on chronic diastolic (congestive) heart failure: Secondary | ICD-10-CM

## 2013-05-13 NOTE — Assessment & Plan Note (Addendum)
I discussed possible percutaneous options with Dr. Excell Seltzer, and, he reports that there are none at present.  There are case reports of experimental designs that permit percutaneous aVR for aortic insufficiency, but these are not currently available. Surgical aortic valve replacement may be an option, but certainly is not an attractive one.  As long as the patient maintains his current level of functioning without recurrent pulmonary edema or other serious cardiac issues, medical therapy will be continued.

## 2013-05-13 NOTE — Assessment & Plan Note (Addendum)
In light of patient's precarious medical condition and chronic symptoms that could reflect hypothyroidism, repeat TFTs will be obtained

## 2013-05-13 NOTE — Progress Notes (Deleted)
Name: [name] [name]    DOB: 1946/05/02  Age: 67 y.o.  MR#: [id number]       PCP:  Jamse Mead, MD      Insurance: Payor: [employer] / Plan: BLUE MEDICARE / Product Type: *No Product type* /   CC:    Chief Complaint  Patient presents with  . Aortic insufficiency  . Coronary Artery Disease   BOTTLES  VS Filed Vitals:   05/13/13 1314  BP: 128/56  Pulse: 82  Height: 6\' 1"  (1.854 m)  Weight: 272 lb (123.378 kg)    Weights Current Weight  05/13/13 272 lb (123.378 kg)  04/07/13 270 lb 1.3 oz (122.507 kg)  03/27/13 270 lb 11.6 oz (122.8 kg)    Blood Pressure  BP Readings from Last 3 Encounters:  05/13/13 128/56  04/07/13 123/51  03/27/13 127/42     Admit date:  (Not on file) Last encounter with RMR:  01/12/2013   Allergy Review of patient's allergies indicates no known allergies.  Current Outpatient Prescriptions  Medication Sig Dispense Refill  . albuterol (PROVENTIL) (2.5 MG/3ML) 0.083% nebulizer solution Take 3 mLs (2.5 mg total) by nebulization every 6 (six) hours as needed for wheezing.  75 mL  12  . amLODipine (NORVASC) 5 MG tablet Take 1 tablet (5 mg total) by mouth daily.  30 tablet  6  . budesonide-formoterol (SYMBICORT) 160-4.5 MCG/ACT inhaler Inhale 2 puffs into the lungs 2 (two) times daily.      . cloNIDine (CATAPRES) 0.1 MG tablet Take 0.1 mg by mouth 3 (three) times daily.      Marland Kitchen desmopressin (DDAVP) 0.2 MG tablet Take 0.2 mg by mouth 2 (two) times daily.      Marland Kitchen dexamethasone (DECADRON) 0.5 MG tablet Take 0.5 mg by mouth daily.      Marland Kitchen levothyroxine (SYNTHROID, LEVOTHROID) 150 MCG tablet Take 150 mcg by mouth daily.      Marland Kitchen linagliptin (TRADJENTA) 5 MG TABS tablet Take 5 mg by mouth daily.      . phenytoin (DILANTIN) 100 MG ER capsule Take 200-300 mg by mouth daily. Takes 200 mg on Monday and Thursdsay. On all other days of the week takes 300 mg.      . simvastatin (ZOCOR) 80 MG tablet Take 80 mg by mouth daily.       Marland Kitchen tiotropium  (SPIRIVA) 18 MCG inhalation capsule Place 18 mcg into inhaler and inhale daily.      Marland Kitchen torsemide (DEMADEX) 20 MG tablet Take 2 tablets (40 mg total) by mouth daily. 0.5  Tablet daily. Weigh yourself daily and if your weight goes up three pounds take another 0.5 tab  60 tablet  12   No current facility-administered medications for this visit.    Discontinued Meds:   There are no discontinued medications.  Patient Active Problem List   Diagnosis Date Noted  . Seizure disorder 03/27/2013  . Diabetes 03/27/2013  . Acute on chronic diastolic congestive heart failure 03/27/2013  . Hypokalemia 03/27/2013  . Hypertension   . Chronic kidney disease   . Sleep apnea   . Hypothyroid   . Tobacco abuse, in remission   . Hyperlipidemia   . Panhypopituitarism   . Obese 10/28/2012  . Aortic valve disease 10/08/2012    LABS    Component Value Date/Time   NA 137 04/07/2013 1345   NA 131* 03/27/2013 0715   NA 133* 03/26/2013 0640   K 4.2 04/07/2013 1345  K 4.1 03/27/2013 0715   K 4.4 03/26/2013 0640   CL 99 04/07/2013 1345   CL 90* 03/27/2013 0715   CL 91* 03/26/2013 0640   CO2 28 04/07/2013 1345   CO2 30 03/27/2013 0715   CO2 29 03/26/2013 0640   GLUCOSE 87 04/07/2013 1345   GLUCOSE 111* 03/27/2013 0715   GLUCOSE 96 03/26/2013 0640   BUN 71* 04/07/2013 1345   BUN 69* 03/27/2013 0715   BUN 59* 03/26/2013 0640   CREATININE 2.34* 04/07/2013 1345   CREATININE 2.82* 03/27/2013 0715   CREATININE 2.60* 03/26/2013 0640   CREATININE 2.31* 03/25/2013 0557   CREATININE 2.56* 12/21/2012 1450   CREATININE 2.25* 11/18/2012 1501   CALCIUM 9.7 04/07/2013 1345   CALCIUM 9.5 03/27/2013 0715   CALCIUM 9.7 03/26/2013 0640   CALCIUM 8.5 09/09/2012 0536   GFRNONAA 21* 03/27/2013 0715   GFRNONAA 24* 03/26/2013 0640   GFRNONAA 27* 03/25/2013 0557   GFRAA 25* 03/27/2013 0715   GFRAA 27* 03/26/2013 0640   GFRAA 32* 03/25/2013 0557   CMP     Component Value Date/Time   NA 137 04/07/2013 1345   K 4.2 04/07/2013 1345   CL 99 04/07/2013 1345    CO2 28 04/07/2013 1345   GLUCOSE 87 04/07/2013 1345   BUN 71* 04/07/2013 1345   CREATININE 2.34* 04/07/2013 1345   CREATININE 2.82* 03/27/2013 0715   CALCIUM 9.7 04/07/2013 1345   CALCIUM 8.5 09/09/2012 0536   PROT 7.5 03/19/2013 1409   ALBUMIN 3.8 03/19/2013 1409   AST 26 03/19/2013 1409   ALT 19 03/19/2013 1409   ALKPHOS 46 03/19/2013 1409   BILITOT 0.4 03/19/2013 1409   GFRNONAA 21* 03/27/2013 0715   GFRAA 25* 03/27/2013 0715       Component Value Date/Time   WBC 6.4 03/25/2013 0557   WBC 5.1 03/23/2013 0533   WBC 5.7 03/21/2013 0525   HGB 11.8* 03/25/2013 0557   HGB 10.8* 03/23/2013 0533   HGB 12.1* 03/21/2013 0525   HCT 35.4* 03/25/2013 0557   HCT 33.2* 03/23/2013 0533   HCT 36.9* 03/21/2013 0525   MCV 88.9 03/25/2013 0557   MCV 90.7 03/23/2013 0533   MCV 89.8 03/21/2013 0525    Lipid Panel     Component Value Date/Time   CHOL 157 03/22/2013 0517   TRIG 170* 03/22/2013 0517   HDL 41 03/22/2013 0517   CHOLHDL 3.8 03/22/2013 0517   VLDL 34 03/22/2013 0517   LDLCALC 82 03/22/2013 0517    ABG    Component Value Date/Time   PHART 7.357 09/26/2012 1623   PCO2ART 48.1* 09/26/2012 1623   PO2ART 112.0* 09/26/2012 1623   HCO3 26.3* 09/26/2012 1623   TCO2 22.8 09/26/2012 1623   ACIDBASEDEF 0.9 03/21/2011 0830   O2SAT 97.7 09/26/2012 1623     Lab Results  Component Value Date   TSH 0.427 10/28/2012   BNP (last 3 results)  Recent Labs  01/07/13 2002 03/19/13 1409 03/26/13 0640  PROBNP 3996.0* 5243.0* 3180.0*   Cardiac Panel (last 3 results) No results found for this basename: CKTOTAL, CKMB, TROPONINI, RELINDX,  in the last 72 hours  Iron/TIBC/Ferritin No results found for this basename: iron, tibc, ferritin     EKG Orders placed in visit on 04/07/13  . EKG 12-LEAD     Prior Assessment and Plan Problem List as of 05/13/2013   Obese   Last Assessment & Plan   11/18/2012 Office Visit Written 11/18/2012  3:35 PM by Kathlen Brunswick, MD  Fluid retention contributed to excess  weight.  Following diuresis, he is no longer classified as morbidly obese.    Hypertension   Last Assessment & Plan   04/07/2013 Office Visit Written 04/07/2013  1:29 PM by Jodelle Gross, NP     Blood pressure is well controlled presently. No changes in his management at this time.    Chronic kidney disease   Last Assessment & Plan   12/21/2012 Office Visit Written 12/21/2012  3:08 PM by Kathlen Brunswick, MD     Moderate chronic renal insufficiency increases the risk of cardiac catheterization. A repeat metabolic profile will be obtained to reassess creatinine.    Sleep apnea   Last Assessment & Plan   11/18/2012 Office Visit Written 11/18/2012  3:28 PM by Kathlen Brunswick, MD     Sleep apnea is likely persistent and contributing to patient's multiple medical problems.  A return visit to a sleep specialist will be desirable but will be deferred for the present.    Hypothyroid   Last Assessment & Plan   11/18/2012 Office Visit Written 11/19/2012 10:53 AM by Kathlen Brunswick, MD     Slightly low free T4 with low TSH and no clinical signs or symptoms of hyper-or hypothyroidism; current therapy appears reasonable.    Tobacco abuse, in remission   Hyperlipidemia   Last Assessment & Plan   12/21/2012 Office Visit Written 12/21/2012  3:09 PM by Kathlen Brunswick, MD     Lipid profile will be drawn.    Panhypopituitarism   Last Assessment & Plan   12/21/2012 Office Visit Written 12/21/2012  3:10 PM by Kathlen Brunswick, MD     Previous craniotomy and transsphenoidal surgery as well as panhypopituitarism further increases the risk of open heart surgery and will be considered in planning further evaluation and treatment for patient's cardiac problems.    Aortic valve disease   Last Assessment & Plan   04/07/2013 Office Visit Written 04/07/2013  1:46 PM by Jodelle Gross, NP     Patient has been seen by Dr.Rothbart who is not in favor of cardiac cath or AoV repair.  However, he will continue  to have exacerbations of CHF. Dr. Dietrich Pates will talk to Dr.Cooper to discuss referral for TAVI. He will see the patient in one month. BMET will be ordered today.    Seizure disorder   Diabetes   Acute on chronic diastolic congestive heart failure   Last Assessment & Plan   04/07/2013 Office Visit Written 04/07/2013  1:28 PM by Jodelle Gross, NP     [name] currently well compensated with stable weight today. He shows no signs of fluid overload. Discussed with Dr.Rothbart need to proceed with cardiac cath to evaluate AoV. With CKD this may be undesirable.    Hypokalemia       Imaging: No results found.

## 2013-05-13 NOTE — Assessment & Plan Note (Signed)
Blood pressure control is excellent. Patient is not clearly experiencing adverse effects related to clonidine. If he does, dose reduction may be possible.

## 2013-05-13 NOTE — Assessment & Plan Note (Signed)
CHF appears compensated. Chest x-ray in 03/2013 showed hyperinflated lungs and a minimal right pleural effusion. Weight since then has been stable. At his last visit, patient was treated with a total of 40 mg of torsemide per day. Now he is taking 200 mg a day as prescribed by Dr. Juanetta Gosling with whom I will discuss diuretic adjustments and patient's sleep apnea treatment.

## 2013-05-13 NOTE — Progress Notes (Signed)
Patient ID: [name] [name], male   DOB: [birth year], 67 y.o.   MRN: [id number]  HPI: Schedule return visit for continuing assessment and treatment of aortic valve disease. Patient has done well since his last visit with no need for urgent medical care. Pulmonary status was stable until he developed an episode of bronchitis for which he has received antibiotics.  He has noted some continuing wheezing, but reports that he is generally improved.   Current Outpatient Prescriptions  Medication Sig Dispense Refill  . albuterol (PROVENTIL) (2.5 MG/3ML) 0.083% nebulizer solution Take 3 mLs (2.5 mg total) by nebulization every 6 (six) hours as needed for wheezing.  75 mL  12  . amLODipine (NORVASC) 5 MG tablet Take 1 tablet (5 mg total) by mouth daily.  30 tablet  6  . budesonide-formoterol (SYMBICORT) 160-4.5 MCG/ACT inhaler Inhale 2 puffs into the lungs 2 (two) times daily.      . cloNIDine (CATAPRES) 0.1 MG tablet Take 0.1 mg by mouth 3 (three) times daily.      Marland Kitchen desmopressin (DDAVP) 0.2 MG tablet Take 0.2 mg by mouth 2 (two) times daily.      Marland Kitchen dexamethasone (DECADRON) 0.5 MG tablet Take 0.5 mg by mouth daily.      Marland Kitchen levothyroxine (SYNTHROID, LEVOTHROID) 150 MCG tablet Take 150 mcg by mouth daily.      Marland Kitchen linagliptin (TRADJENTA) 5 MG TABS tablet Take 5 mg by mouth daily.      . phenytoin (DILANTIN) 100 MG ER capsule Take 200-300 mg by mouth daily. Takes 200 mg on Monday and Thursdsay. On all other days of the week takes 300 mg.      . simvastatin (ZOCOR) 80 MG tablet Take 80 mg by mouth daily.       Marland Kitchen tiotropium (SPIRIVA) 18 MCG inhalation capsule Place 18 mcg into inhaler and inhale daily.      Marland Kitchen torsemide (DEMADEX) 100 MG tablet Take 100 mg by mouth 2 (two) times daily.       No current facility-administered medications for this visit.    No Known Allergies   Past medical history, social history, and family history reviewed and updated.  ROS: Denies orthopnea, PND or peripheral edema.  He has had no  palpitations, lightheadedness nor syncope. He now has a mild nonproductive cough with no hemoptysis, fever nor rigors.   All other systems reviewed and are negative.  PHYSICAL EXAM: BP 128/56  Pulse 82  Ht 6\' 1"  (1.854 m)  Wt 123.378 kg (272 lb)  BMI 35.89 kg/m2;  Body mass index is 35.89 kg/(m^2).  uses oxygen continuously General-Well developed; no acute distress Body habitus-moderately to markedly overweight Neck-No JVD; no carotid bruits Lungs-mild expiratory wheeze; resonant to percussion; prolonged expiratory phase Cardiovascular-normal PMI; normal S1 and S2; grade 2/6 basilar systolic ejection murmur; grade 1-2/6 basilar although diastolic decrescendo murmur; normal pulse contour Abdomen-normal bowel sounds; soft and non-tender without masses or organomegaly Musculoskeletal-No deformities, no cyanosis or clubbing Neurologic-Normal cranial nerves; symmetric strength and tone Skin-Warm, no significant lesions Extremities-distal pulses intact; no edema  Ambler Bing, MD 05/13/2013  1:49 PM  ASSESSMENT AND PLAN

## 2013-05-13 NOTE — Patient Instructions (Addendum)
Your physician recommends that you schedule a follow-up appointment in: 2 MONTHS  Your physician recommends that you return for lab work in: THIS WEEK (CMET,BNP,CBC,TSH) SLIPS GIVEN

## 2013-05-14 LAB — COMPREHENSIVE METABOLIC PANEL
ALT: 12 U/L (ref 0–53)
Albumin: 4 g/dL (ref 3.5–5.2)
CO2: 30 mEq/L (ref 19–32)
Chloride: 97 mEq/L (ref 96–112)
Glucose, Bld: 84 mg/dL (ref 70–99)
Potassium: 4.4 mEq/L (ref 3.5–5.3)
Sodium: 139 mEq/L (ref 135–145)
Total Bilirubin: 0.3 mg/dL (ref 0.3–1.2)
Total Protein: 7.8 g/dL (ref 6.0–8.3)

## 2013-05-14 LAB — BRAIN NATRIURETIC PEPTIDE: Brain Natriuretic Peptide: 385.9 pg/mL — ABNORMAL HIGH (ref 0.0–100.0)

## 2013-05-14 LAB — CBC
HCT: 36.3 % — ABNORMAL LOW (ref 39.0–52.0)
MCHC: 33.1 g/dL (ref 30.0–36.0)
Platelets: 208 10*3/uL (ref 150–400)
RDW: 16.7 % — ABNORMAL HIGH (ref 11.5–15.5)

## 2013-05-15 ENCOUNTER — Encounter: Payer: Self-pay | Admitting: Cardiology

## 2013-05-16 ENCOUNTER — Encounter: Payer: Self-pay | Admitting: *Deleted

## 2013-07-13 ENCOUNTER — Encounter: Payer: Self-pay | Admitting: Cardiovascular Disease

## 2013-07-13 ENCOUNTER — Ambulatory Visit (INDEPENDENT_AMBULATORY_CARE_PROVIDER_SITE_OTHER): Payer: Medicare Other | Admitting: Cardiovascular Disease

## 2013-07-13 VITALS — BP 143/59 | HR 89 | Ht 73.0 in | Wt 273.0 lb

## 2013-07-13 DIAGNOSIS — I10 Essential (primary) hypertension: Secondary | ICD-10-CM

## 2013-07-13 DIAGNOSIS — I5033 Acute on chronic diastolic (congestive) heart failure: Secondary | ICD-10-CM

## 2013-07-13 DIAGNOSIS — I359 Nonrheumatic aortic valve disorder, unspecified: Secondary | ICD-10-CM

## 2013-07-13 DIAGNOSIS — E782 Mixed hyperlipidemia: Secondary | ICD-10-CM

## 2013-07-13 DIAGNOSIS — I509 Heart failure, unspecified: Secondary | ICD-10-CM

## 2013-07-13 NOTE — Progress Notes (Signed)
Patient ID: [name] [name], male   DOB: 01-Aug-1946, 67 y.o.   MRN: [id number]    SUBJECTIVE: [name] [name] is here for f/u of moderate aortic regurgitation, acute on chronic diastolic heart failure, HTN, and hyperlipidemia. He has COPD and says he's been on oxygen for 2 years. He says his baseline shortness of breath is stable. He denies chest pain, lightheadedness, dizziness, and syncope. He was told he's had a heart attack in the past, but he was never treated for one and doesn't remember having one.   - Left ventricle: Systolic function was low normal. The estimated ejection fraction was in the range of 50% to 55%. There is hypokinesis of the basalinferior and inferoseptal myocardium. Features are consistent with a pseudonormal left ventricular filling pattern, with concomitant abnormal relaxation and increased filling pressure (grade 2 diastolic dysfunction). - Aortic valve: Trileaflet; mildly calcified leaflets. There was no stenosis. Upper end moderate regurgitation. There is fluttering of the anterior mitral leaflet noted on M mode with AR jet directed eccentrically. Vena contracta under 6 mm. Additional useful measurement would be pulsed Doppler in the descending aorta (subcostal view) to assess for holodiatolic flow reversal - if present would be more consistent with severe AR. Mean gradient: 8mm Hg (S). Regurgitation pressure half-time: . LV end systolic dimention 40.6 mm. No obvious progression compared to prior study. - Aorta: Aortic root dimension: 54mm (ED). - Aortic root: The aortic root was moderately dilated. - Mitral valve: Calcified annulus. Mildly thickened leaflets . Mild regurgitation. - Left atrium: The atrium was moderately to severely dilated. - Right ventricle: The cavity size was mildly dilated. - Right atrium: The atrium was moderately dilated. Central venous pressure: 12mm Hg (est). - Tricuspid valve: Mild regurgitation. - Pulmonary arteries: Systolic  pressure was moderately increased. PA peak pressure: 56mm Hg (S). - Pericardium, extracardiac: There was no pericardial effusion.    Filed Vitals:   07/13/13 1321  BP: 143/59  Pulse: 89  Height: 6\' 1"  (1.854 m)  Weight: 273 lb (123.832 kg)  SpO2: 95%    PHYSICAL EXAM General-Well developed; no acute distress  Body habitus-obese Neck-No JVD; no carotid bruits  Lungs-mild expiratory wheeze; prolonged expiratory phase  Cardiovascular-normal PMI; normal S1 and S2; grade 2/6 basilar systolic ejection murmur; grade 2/4 basilar although diastolic decrescendo murmur Abdomen-normal bowel sounds; soft and non-tender without masses or organomegaly  Musculoskeletal-No deformities, no cyanosis or clubbing  Neurologic-grossly intact Skin-Warm, no significant lesions  Extremities-distal pulses intact; trace edema     LABS: Basic Metabolic Panel: No results found for this basename: NA, K, CL, CO2, GLUCOSE, BUN, CREATININE, CALCIUM, MG, PHOS,  in the last 72 hours Liver Function Tests: No results found for this basename: AST, ALT, ALKPHOS, BILITOT, PROT, ALBUMIN,  in the last 72 hours No results found for this basename: LIPASE, AMYLASE,  in the last 72 hours CBC: No results found for this basename: WBC, NEUTROABS, HGB, HCT, MCV, PLT,  in the last 72 hours Cardiac Enzymes: No results found for this basename: CKTOTAL, CKMB, CKMBINDEX, TROPONINI,  in the last 72 hours BNP: No components found with this basename: POCBNP,  D-Dimer: No results found for this basename: DDIMER,  in the last 72 hours Hemoglobin A1C: No results found for this basename: HGBA1C,  in the last 72 hours Fasting Lipid Panel: No results found for this basename: CHOL, HDL, LDLCALC, TRIG, CHOLHDL, LDLDIRECT,  in the last 72 hours Thyroid Function Tests: No results found for this basename: TSH, T4TOTAL, FREET3, T3FREE, THYROIDAB,  in the last 72 hours Anemia Panel: No results found for this basename: VITAMINB12,  FOLATE, FERRITIN, TIBC, IRON, RETICCTPCT,  in the last 72 hours  RADIOLOGY: No results found.    ASSESSMENT AND PLAN:  1. Aortic regurgitation: appears to be stable, and at the upper end of moderate as per most recent echo report. Will continue to monitor as at some point, he will likely require surgical replacement.  2. Chronic diastolic heart failure: appears to be compensated from this standpoint, and on potent high-dose diuretics. [name] also followed by Nephrology.  3. HTN: reasonable control at present.  4. Hyperlipidemia: followed by PCP.   Prentice Docker, M.D., F.A.C.C.

## 2013-07-13 NOTE — Patient Instructions (Addendum)
Your physician recommends that you schedule a follow-up appointment in: 6 MONTHS   Your physician recommends that you continue on your current medications as directed. Please refer to the Current Medication list given to you today.  

## 2013-08-25 ENCOUNTER — Emergency Department (HOSPITAL_COMMUNITY): Payer: Medicare Other

## 2013-08-25 ENCOUNTER — Inpatient Hospital Stay (HOSPITAL_COMMUNITY)
Admission: EM | Admit: 2013-08-25 | Discharge: 2013-08-27 | DRG: 190 | Disposition: A | Discharge status: Home / Self Care | Payer: Medicare Other | Attending: Internal Medicine | Admitting: Internal Medicine

## 2013-08-25 ENCOUNTER — Encounter (HOSPITAL_COMMUNITY): Payer: Self-pay | Admitting: *Deleted

## 2013-08-25 DIAGNOSIS — E230 Hypopituitarism: Secondary | ICD-10-CM | POA: Diagnosis present

## 2013-08-25 DIAGNOSIS — I5023 Acute on chronic systolic (congestive) heart failure: Secondary | ICD-10-CM | POA: Diagnosis present

## 2013-08-25 DIAGNOSIS — Z8249 Family history of ischemic heart disease and other diseases of the circulatory system: Secondary | ICD-10-CM

## 2013-08-25 DIAGNOSIS — G40909 Epilepsy, unspecified, not intractable, without status epilepticus: Secondary | ICD-10-CM | POA: Diagnosis present

## 2013-08-25 DIAGNOSIS — J9620 Acute and chronic respiratory failure, unspecified whether with hypoxia or hypercapnia: Secondary | ICD-10-CM | POA: Diagnosis present

## 2013-08-25 DIAGNOSIS — Z87891 Personal history of nicotine dependence: Secondary | ICD-10-CM

## 2013-08-25 DIAGNOSIS — J9600 Acute respiratory failure, unspecified whether with hypoxia or hypercapnia: Secondary | ICD-10-CM

## 2013-08-25 DIAGNOSIS — I509 Heart failure, unspecified: Secondary | ICD-10-CM | POA: Diagnosis present

## 2013-08-25 DIAGNOSIS — Z23 Encounter for immunization: Secondary | ICD-10-CM

## 2013-08-25 DIAGNOSIS — J441 Chronic obstructive pulmonary disease with (acute) exacerbation: Secondary | ICD-10-CM

## 2013-08-25 DIAGNOSIS — G4730 Sleep apnea, unspecified: Secondary | ICD-10-CM | POA: Diagnosis present

## 2013-08-25 DIAGNOSIS — Z905 Acquired absence of kidney: Secondary | ICD-10-CM

## 2013-08-25 DIAGNOSIS — N189 Chronic kidney disease, unspecified: Secondary | ICD-10-CM | POA: Diagnosis present

## 2013-08-25 DIAGNOSIS — Z79899 Other long term (current) drug therapy: Secondary | ICD-10-CM

## 2013-08-25 DIAGNOSIS — E119 Type 2 diabetes mellitus without complications: Secondary | ICD-10-CM | POA: Diagnosis present

## 2013-08-25 DIAGNOSIS — I359 Nonrheumatic aortic valve disorder, unspecified: Secondary | ICD-10-CM | POA: Diagnosis present

## 2013-08-25 DIAGNOSIS — E785 Hyperlipidemia, unspecified: Secondary | ICD-10-CM | POA: Diagnosis present

## 2013-08-25 DIAGNOSIS — E039 Hypothyroidism, unspecified: Secondary | ICD-10-CM | POA: Diagnosis present

## 2013-08-25 DIAGNOSIS — I5033 Acute on chronic diastolic (congestive) heart failure: Secondary | ICD-10-CM

## 2013-08-25 DIAGNOSIS — Z85528 Personal history of other malignant neoplasm of kidney: Secondary | ICD-10-CM

## 2013-08-25 DIAGNOSIS — I129 Hypertensive chronic kidney disease with stage 1 through stage 4 chronic kidney disease, or unspecified chronic kidney disease: Secondary | ICD-10-CM | POA: Diagnosis present

## 2013-08-25 DIAGNOSIS — Z6841 Body Mass Index (BMI) 40.0 and over, adult: Secondary | ICD-10-CM

## 2013-08-25 LAB — BLOOD GAS, ARTERIAL
Acid-Base Excess: 1.9 mmol/L (ref 0.0–2.0)
Bicarbonate: 26.2 mEq/L — ABNORMAL HIGH (ref 20.0–24.0)
Bicarbonate: 26.6 mEq/L — ABNORMAL HIGH (ref 20.0–24.0)
Delivery systems: POSITIVE
FIO2: 0.5 %
O2 Content: 2 L/min
O2 Saturation: 95.2 %
O2 Saturation: 99.3 %
Patient temperature: 37
Pressure support: 6 cmH2O
TCO2: 23.8 mmol/L (ref 0–100)
TCO2: 24.3 mmol/L (ref 0–100)
pCO2 arterial: 43.1 mmHg (ref 35.0–45.0)
pO2, Arterial: 79.8 mmHg — ABNORMAL LOW (ref 80.0–100.0)
pO2, Arterial: 173 mmHg — ABNORMAL HIGH (ref 80.0–100.0)

## 2013-08-25 LAB — URINALYSIS, ROUTINE W REFLEX MICROSCOPIC
Bilirubin Urine: NEGATIVE
Glucose, UA: NEGATIVE mg/dL
Ketones, ur: NEGATIVE mg/dL
Protein, ur: 30 mg/dL — AB
Urobilinogen, UA: 0.2 mg/dL (ref 0.0–1.0)

## 2013-08-25 LAB — COMPREHENSIVE METABOLIC PANEL
ALT: 17 U/L (ref 0–53)
Alkaline Phosphatase: 66 U/L (ref 39–117)
BUN: 34 mg/dL — ABNORMAL HIGH (ref 6–23)
CO2: 30 mEq/L (ref 19–32)
GFR calc Af Amer: 29 mL/min — ABNORMAL LOW (ref >90)
GFR calc non Af Amer: 25 mL/min — ABNORMAL LOW (ref >90)
Glucose, Bld: 111 mg/dL — ABNORMAL HIGH (ref 70–99)
Potassium: 3.7 mEq/L (ref 3.5–5.1)
Total Protein: 7.6 g/dL (ref 6.0–8.3)

## 2013-08-25 LAB — URINE MICROSCOPIC-ADD ON

## 2013-08-25 LAB — CBC WITH DIFFERENTIAL/PLATELET
Eosinophils Absolute: 1.2 10*3/uL — ABNORMAL HIGH (ref 0.0–0.7)
Eosinophils Relative: 15 % — ABNORMAL HIGH (ref 0–5)
Hemoglobin: 11.9 g/dL — ABNORMAL LOW (ref 13.0–17.0)
Lymphocytes Relative: 13 % (ref 12–46)
Lymphs Abs: 1.1 10*3/uL (ref 0.7–4.0)
MCH: 28.9 pg (ref 26.0–34.0)
MCV: 90.8 fL (ref 78.0–100.0)
Monocytes Relative: 8 % (ref 3–12)
Neutrophils Relative %: 63 % (ref 43–77)
RBC: 4.12 MIL/uL — ABNORMAL LOW (ref 4.22–5.81)
WBC: 8.1 10*3/uL (ref 4.0–10.5)

## 2013-08-25 LAB — TROPONIN I: Troponin I: <0.3 ng/mL (ref <0.30)

## 2013-08-25 MED ORDER — ALBUTEROL SULFATE (5 MG/ML) 0.5% IN NEBU
INHALATION_SOLUTION | RESPIRATORY_TRACT | Status: AC
  Filled 2013-08-25: qty 0.5

## 2013-08-25 MED ORDER — FUROSEMIDE 10 MG/ML IJ SOLN
40.0000 mg | Freq: Once | INTRAMUSCULAR | Status: AC
  Administered 2013-08-25: 40 mg via INTRAVENOUS

## 2013-08-25 MED ORDER — LINAGLIPTIN 5 MG PO TABS
5.0000 mg | ORAL_TABLET | Freq: Every day | ORAL | Status: DC
  Administered 2013-08-26 – 2013-08-27 (×2): 5 mg via ORAL
  Filled 2013-08-25 (×2): qty 1

## 2013-08-25 MED ORDER — ALBUTEROL SULFATE (5 MG/ML) 0.5% IN NEBU
2.5000 mg | INHALATION_SOLUTION | RESPIRATORY_TRACT | Status: AC | PRN
  Administered 2013-08-25 – 2013-08-26 (×2): 2.5 mg via RESPIRATORY_TRACT
  Filled 2013-08-25 (×2): qty 0.5

## 2013-08-25 MED ORDER — METHYLPREDNISOLONE SODIUM SUCC 125 MG IJ SOLR
125.0000 mg | Freq: Once | INTRAMUSCULAR | Status: AC
  Administered 2013-08-25: 125 mg via INTRAVENOUS
  Filled 2013-08-25: qty 2

## 2013-08-25 MED ORDER — ALBUTEROL (5 MG/ML) CONTINUOUS INHALATION SOLN
10.0000 mg/h | INHALATION_SOLUTION | Freq: Once | RESPIRATORY_TRACT | Status: AC
  Administered 2013-08-25: 10 mg/h via RESPIRATORY_TRACT
  Filled 2013-08-25: qty 20

## 2013-08-25 MED ORDER — PHENYTOIN SODIUM EXTENDED 100 MG PO CAPS: 200.0000 mg | ORAL_CAPSULE | ORAL | Status: DC

## 2013-08-25 MED ORDER — IPRATROPIUM BROMIDE 0.02 % IN SOLN
0.5000 mg | Freq: Once | RESPIRATORY_TRACT | Status: AC
  Administered 2013-08-25: 0.5 mg via RESPIRATORY_TRACT
  Filled 2013-08-25: qty 2.5

## 2013-08-25 MED ORDER — LEVOTHYROXINE SODIUM 75 MCG PO TABS
150.0000 ug | ORAL_TABLET | Freq: Every day | ORAL | Status: DC
  Administered 2013-08-26 – 2013-08-27 (×2): 150 ug via ORAL
  Filled 2013-08-25 (×2): qty 2

## 2013-08-25 MED ORDER — FUROSEMIDE 10 MG/ML IJ SOLN
40.0000 mg | Freq: Once | INTRAMUSCULAR | Status: AC
  Administered 2013-08-25: 40 mg via INTRAVENOUS
  Filled 2013-08-25: qty 4

## 2013-08-25 MED ORDER — PHENYTOIN SODIUM EXTENDED 100 MG PO CAPS
300.0000 mg | ORAL_CAPSULE | ORAL | Status: DC
  Administered 2013-08-26 – 2013-08-27 (×2): 300 mg via ORAL
  Filled 2013-08-25 (×2): qty 3

## 2013-08-25 MED ORDER — MAGNESIUM SULFATE 40 MG/ML IJ SOLN
2.0000 g | Freq: Once | INTRAMUSCULAR | Status: AC
  Administered 2013-08-25: 2 g via INTRAVENOUS
  Filled 2013-08-25: qty 50

## 2013-08-25 MED ORDER — AMLODIPINE BESYLATE 5 MG PO TABS
5.0000 mg | ORAL_TABLET | Freq: Every day | ORAL | Status: DC
  Administered 2013-08-26 – 2013-08-27 (×2): 5 mg via ORAL
  Filled 2013-08-25 (×2): qty 1

## 2013-08-25 MED ORDER — TIOTROPIUM BROMIDE MONOHYDRATE 18 MCG IN CAPS
18.0000 ug | ORAL_CAPSULE | Freq: Every day | RESPIRATORY_TRACT | Status: DC
Start: 2013-08-26 — End: 2013-08-27
  Administered 2013-08-26 – 2013-08-27 (×2): 18 ug via RESPIRATORY_TRACT
  Filled 2013-08-25: qty 5

## 2013-08-25 MED ORDER — FUROSEMIDE 10 MG/ML IJ SOLN
INTRAMUSCULAR | Status: AC
  Administered 2013-08-25: 40 mg via INTRAVENOUS
  Filled 2013-08-25: qty 4

## 2013-08-25 MED ORDER — BUDESONIDE-FORMOTEROL FUMARATE 160-4.5 MCG/ACT IN AERO
2.0000 | INHALATION_SPRAY | Freq: Two times a day (BID) | RESPIRATORY_TRACT | Status: DC
  Administered 2013-08-26 – 2013-08-27 (×3): 2 via RESPIRATORY_TRACT
  Filled 2013-08-25: qty 6

## 2013-08-25 MED ORDER — ONDANSETRON HCL 4 MG/2ML IJ SOLN
4.0000 mg | Freq: Three times a day (TID) | INTRAMUSCULAR | Status: AC | PRN
Start: 2013-08-25 — End: 2013-08-26

## 2013-08-25 MED ORDER — CLONIDINE HCL 0.1 MG PO TABS
0.1000 mg | ORAL_TABLET | Freq: Three times a day (TID) | ORAL | Status: DC
  Administered 2013-08-25 – 2013-08-27 (×5): 0.1 mg via ORAL
  Filled 2013-08-25 (×5): qty 1

## 2013-08-25 NOTE — Progress Notes (Signed)
Pt transferred from ED to ICU 8 on BIPAP. No complications. Pt tol well.

## 2013-08-25 NOTE — ED Notes (Signed)
Pt respirations increased. Work of breathing increased, pt diaphoretic. EDP aware and at bedside. Give verbal order for non-rebreather to be applied and call respiratory for bi-pap.
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
## 2013-08-25 NOTE — ED Notes (Signed)
Pt states increased sob since yesterday. Productive cough, grey in color. States he is unable to walk due to the increased sob

## 2013-08-26 LAB — MRSA PCR SCREENING: MRSA by PCR: NEGATIVE

## 2013-08-26 MED ORDER — METHYLPREDNISOLONE SODIUM SUCC 125 MG IJ SOLR
125.0000 mg | Freq: Four times a day (QID) | INTRAMUSCULAR | Status: DC
  Administered 2013-08-26 – 2013-08-27 (×5): 125 mg via INTRAVENOUS
  Filled 2013-08-26 (×5): qty 2

## 2013-08-26 MED ORDER — LORATADINE 10 MG PO TABS
10.0000 mg | ORAL_TABLET | Freq: Every day | ORAL | Status: DC
  Administered 2013-08-26 – 2013-08-27 (×2): 10 mg via ORAL
  Filled 2013-08-26 (×2): qty 1

## 2013-08-26 MED ORDER — LEVOFLOXACIN IN D5W 500 MG/100ML IV SOLN
500.0000 mg | INTRAVENOUS | Status: DC
  Administered 2013-08-26 – 2013-08-27 (×2): 500 mg via INTRAVENOUS
  Filled 2013-08-26 (×4): qty 100

## 2013-08-26 MED ORDER — ENOXAPARIN SODIUM 40 MG/0.4ML ~~LOC~~ SOLN: 40.0000 mg | SUBCUTANEOUS | Status: DC

## 2013-08-26 MED ORDER — ALBUTEROL SULFATE (5 MG/ML) 0.5% IN NEBU
2.5000 mg | INHALATION_SOLUTION | RESPIRATORY_TRACT | Status: DC
  Administered 2013-08-26: 2.5 mg via RESPIRATORY_TRACT
  Filled 2013-08-26: qty 0.5

## 2013-08-26 MED ORDER — ENOXAPARIN SODIUM 30 MG/0.3ML ~~LOC~~ SOLN
30.0000 mg | SUBCUTANEOUS | Status: DC
  Administered 2013-08-26: 30 mg via SUBCUTANEOUS
  Filled 2013-08-26: qty 0.3

## 2013-08-26 MED ORDER — ENOXAPARIN SODIUM 40 MG/0.4ML ~~LOC~~ SOLN
40.0000 mg | SUBCUTANEOUS | Status: DC
  Administered 2013-08-27: 40 mg via SUBCUTANEOUS
  Filled 2013-08-26: qty 0.4

## 2013-08-26 MED ORDER — ALBUTEROL SULFATE (5 MG/ML) 0.5% IN NEBU
2.5000 mg | INHALATION_SOLUTION | RESPIRATORY_TRACT | Status: DC
  Administered 2013-08-26 – 2013-08-27 (×4): 2.5 mg via RESPIRATORY_TRACT
  Filled 2013-08-26 (×5): qty 0.5

## 2013-08-26 MED ORDER — INFLUENZA VAC SPLIT QUAD 0.5 ML IM SUSP
0.5000 mL | INTRAMUSCULAR | Status: AC
  Administered 2013-08-27: 0.5 mL via INTRAMUSCULAR
  Filled 2013-08-26: qty 0.5

## 2013-08-26 NOTE — Progress Notes (Signed)
Patient ready for transfer to third floor.  No acute distress noted.
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
## 2013-08-26 NOTE — Progress Notes (Signed)
Subjective: Patient was admitted due to shortness of breath. He was started on nebulizer treatment and IV steroid. Patient feels better today.  Objective: Vital signs in last 24 hours: Temp:  [97.4 F (36.3 C)-99 F (37.2 C)] 98.1 F (36.7 C) (09/19 0400) Pulse Rate:  [71-104] 79 (09/19 0600) Resp:  [13-26] 15 (09/19 0600) BP: (122-181)/(51-88) 137/51 mmHg (09/19 0600) SpO2:  [88 %-100 %] 98 % (09/19 0600) FiO2 (%):  [50 %-100 %] 100 % (09/19 0355) Weight:  [117.935 kg (260 lb)-121 kg (266 lb 12.1 oz)] 121 kg (266 lb 12.1 oz) (09/18 2044) Weight change:     Intake/Output from previous day: 09/18 0701 - 09/19 0700 In: -  Out: 940 [Urine:940]  PHYSICAL EXAM General appearance: alert and no distress Resp: diminished breath sounds bilaterally and rhonchi bilaterally Cardio: S1, S2 normal GI: soft, non-tender; bowel sounds normal; no masses,  no organomegaly Extremities: extremities normal, atraumatic, no cyanosis or edema  Lab Results:    @labtest @ [facility]  Recent Labs  08/25/13 1858  PHART 7.354  PO2ART 173.0*  TCO2 24.3  HCO3 26.6*   CULTURES Recent Results (from the past 240 hour(s))  MRSA PCR SCREENING     Status: None   Collection Time    08/25/13  8:45 PM      Result Value Range Status   MRSA by PCR NEGATIVE  NEGATIVE Final   Comment:            The GeneXpert MRSA Assay (FDA     approved for NASAL specimens     only), is one component of a     comprehensive MRSA colonization     surveillance program. It is not     intended to diagnose MRSA     infection nor to guide or     monitor treatment for     MRSA infections.   Studies/Results: Dg Chest Portable 1 View  08/25/2013   *RADIOLOGY REPORT*  Clinical Data: Shortness of breath, history of COPD, hypertension, sleep apnea, former smoker, initial encounter  PORTABLE CHEST - 1 VIEW  Comparison: 03/25/2013; 01/11/2013; 10/26/2012  Findings:  Examination is degraded due to patient body habitus and portable  technique.  Grossly unchanged enlarged cardiac silhouette and mediastinal contours.  Mild cephalization of flow without frank evidence of edema. Worsening perihilar and bibasilar opacities, left greater than right, likely atelectasis.  No definite pleural effusion or pneumothorax.  Unchanged bones.  IMPRESSION:  Degraded examination with findings of cardiomegaly, pulmonary venous congestion and worsening bibasilar atelectasis without definite acute cardiopulmonary disease.  Further evaluation with a PA and lateral chest radiograph may be obtained as clinically indicated.   Original Report Authenticated By: Tacey Ruiz, MD    Medications: I have reviewed the patient's current medications.  Assesment: COPD with exacerbation Congestive heart failure-predominantly right-sided  Hypertension  Chronic kidney disease  Sleep apnea  Hypothyroid  Panhypopituitarism  Seizure disorder  Diabetes   Active Problems:   * No active hospital problems. *    Plan: Continue nebulizer Continue Iv antibiotics Will do pulmonary consult Continue regular treatment    LOS: 1 day   Evva Din 08/26/2013, 8:20 AM

## 2013-08-26 NOTE — Care Management Note (Signed)
    Page 1 of 1   08/26/2013     4:05:05 PM   CARE MANAGEMENT NOTE 08/26/2013  Patient:  [name] [name], [name] [name]   Account Number:  [id number]  Date Initiated:  08/26/2013  Documentation initiated by:  Sharrie Rothman  Subjective/Objective Assessment:   pt admitted from home with respirtory failure. Pt lives alone but has family close by to check on him.Pt is fairly independent with ADL's. Pt has home O2 from [employer] and neb machine.     Action/Plan:   Pt would like script for elevated toilet set at discharge. Pt will pick up equipment at discharge. No other Cm needs noted.   Anticipated DC Date:  08/29/2013   Anticipated DC Plan:  HOME/SELF CARE      DC Planning Services  CM consult      Choice offered to / List presented to:             Status of service:  Completed, signed off Medicare Important Message given?   (If response is "NO", the following Medicare IM given date fields will be blank) Date Medicare IM given:   Date Additional Medicare IM given:    Discharge Disposition:  HOME/SELF CARE  Per UR Regulation:    If discussed at Long Length of Stay Meetings, dates discussed:    Comments:  08/26/13 1605 Perri Lamagna Amie Critchley, RN BSN CM

## 2013-08-26 NOTE — Progress Notes (Signed)
RN notified RRT that she took patient off BIPAP and placed on a 2 lpm nasal cannula around 0100. Patient in no respiratory distress at this time and O2 sats are 95-100%. Patient sitting up in bed eating ice cream at this time.

## 2013-08-26 NOTE — Progress Notes (Signed)
Pt came up to floor from ICU. Pt is in NAD will continue to monitor.  
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
## 2013-08-26 NOTE — Progress Notes (Signed)
UR chart review completed.  

## 2013-08-27 MED ORDER — METHYLPREDNISOLONE (PAK) 4 MG PO TABS: ORAL_TABLET | ORAL | Status: DC

## 2013-08-27 MED ORDER — LEVOFLOXACIN 500 MG PO TABS: 500.0000 mg | ORAL_TABLET | Freq: Every day | ORAL | Status: DC

## 2013-08-27 NOTE — Progress Notes (Signed)
Subjective: He says he feels better and wants to go home. He has no new complaints.  Objective: Vital signs in last 24 hours: Temp:  [97.4 F (36.3 C)-98.4 F (36.9 C)] 97.4 F (36.3 C) (09/20 0451) Pulse Rate:  [72-85] 81 (09/20 0451) Resp:  [17-20] 20 (09/20 0451) BP: (125-150)/(50-61) 129/50 mmHg (09/20 0451) SpO2:  [97 %-100 %] 98 % (09/20 0701) Weight change:  Last BM Date: 08/26/13  Intake/Output from previous day: 09/19 0701 - 09/20 0700 In: 1300 [P.O.:1200; IV Piggyback:100] Out: 1250 [Urine:1250]  PHYSICAL EXAM General appearance: alert, cooperative and no distress Resp: clear to auscultation bilaterally Cardio: regular rate and rhythm, S1, S2 normal, no murmur, click, rub or gallop GI: soft, non-tender; bowel sounds normal; no masses,  no organomegaly Extremities: extremities normal, atraumatic, no cyanosis or edema  Lab Results:    Basic Metabolic Panel:  Recent Labs  16/10/96 1711  NA 140  K 3.7  CL 101  CO2 30  GLUCOSE 111*  BUN 34*  CREATININE 2.50*  CALCIUM 9.3   Liver Function Tests:  Recent Labs  08/25/13 1711  AST 23  ALT 17  ALKPHOS 66  BILITOT 0.3  PROT 7.6  ALBUMIN 3.9   No results found for this basename: LIPASE, AMYLASE,  in the last 72 hours No results found for this basename: AMMONIA,  in the last 72 hours CBC:  Recent Labs  08/25/13 1711  WBC 8.1  NEUTROABS 5.1  HGB 11.9*  HCT 37.4*  MCV 90.8  PLT 168   Cardiac Enzymes:  Recent Labs  08/25/13 1711  TROPONINI <0.30   BNP:  Recent Labs  08/25/13 1711  PROBNP 5414.0*   D-Dimer: No results found for this basename: DDIMER,  in the last 72 hours CBG: No results found for this basename: GLUCAP,  in the last 72 hours Hemoglobin A1C: No results found for this basename: HGBA1C,  in the last 72 hours Fasting Lipid Panel: No results found for this basename: CHOL, HDL, LDLCALC, TRIG, CHOLHDL, LDLDIRECT,  in the last 72 hours Thyroid Function Tests: No results  found for this basename: TSH, T4TOTAL, FREET4, T3FREE, THYROIDAB,  in the last 72 hours Anemia Panel: No results found for this basename: VITAMINB12, FOLATE, FERRITIN, TIBC, IRON, RETICCTPCT,  in the last 72 hours Coagulation: No results found for this basename: LABPROT, INR,  in the last 72 hours Urine Drug Screen: Drugs of Abuse  No results found for this basename: labopia, cocainscrnur, labbenz, amphetmu, thcu, labbarb    Alcohol Level: No results found for this basename: ETH,  in the last 72 hours Urinalysis:  Recent Labs  08/25/13 1820  COLORURINE YELLOW  LABSPEC 1.020  PHURINE 5.5  GLUCOSEU NEGATIVE  HGBUR SMALL*  BILIRUBINUR NEGATIVE  KETONESUR NEGATIVE  PROTEINUR 30*  UROBILINOGEN 0.2  NITRITE NEGATIVE  LEUKOCYTESUR NEGATIVE   Misc. Labs:  ABGS  Recent Labs  08/25/13 1858  PHART 7.354  PO2ART 173.0*  TCO2 24.3  HCO3 26.6*   CULTURES Recent Results (from the past 240 hour(s))  MRSA PCR SCREENING     Status: None   Collection Time    08/25/13  8:45 PM      Result Value Range Status   MRSA by PCR NEGATIVE  NEGATIVE Final   Comment:            The GeneXpert MRSA Assay (FDA     approved for NASAL specimens     only), is one component of a  comprehensive MRSA colonization     surveillance program. It is not     intended to diagnose MRSA     infection nor to guide or     monitor treatment for     MRSA infections.   Studies/Results: Dg Chest Portable 1 View  08/25/2013   *RADIOLOGY REPORT*  Clinical Data: Shortness of breath, history of COPD, hypertension, sleep apnea, former smoker, initial encounter  PORTABLE CHEST - 1 VIEW  Comparison: 03/25/2013; 01/11/2013; 10/26/2012  Findings:  Examination is degraded due to patient body habitus and portable technique.  Grossly unchanged enlarged cardiac silhouette and mediastinal contours.  Mild cephalization of flow without frank evidence of edema. Worsening perihilar and bibasilar opacities, left greater than  right, likely atelectasis.  No definite pleural effusion or pneumothorax.  Unchanged bones.  IMPRESSION:  Degraded examination with findings of cardiomegaly, pulmonary venous congestion and worsening bibasilar atelectasis without definite acute cardiopulmonary disease.  Further evaluation with a PA and lateral chest radiograph may be obtained as clinically indicated.   Original Report Authenticated By: Tacey Ruiz, MD    Medications:  Prior to Admission:  Prescriptions prior to admission  Medication Sig Dispense Refill  . albuterol (PROVENTIL) (2.5 MG/3ML) 0.083% nebulizer solution Take 3 mLs (2.5 mg total) by nebulization every 6 (six) hours as needed for wheezing.  75 mL  12  . amLODipine (NORVASC) 5 MG tablet Take 1 tablet (5 mg total) by mouth daily.  30 tablet  6  . budesonide-formoterol (SYMBICORT) 160-4.5 MCG/ACT inhaler Inhale 2 puffs into the lungs 2 (two) times daily.      . cloNIDine (CATAPRES) 0.1 MG tablet Take 0.1 mg by mouth 3 (three) times daily.      Marland Kitchen desmopressin (DDAVP) 0.2 MG tablet Take 0.2 mg by mouth 2 (two) times daily.      Marland Kitchen dexamethasone (DECADRON) 0.5 MG tablet Take 0.5 mg by mouth daily.      Marland Kitchen levothyroxine (SYNTHROID, LEVOTHROID) 150 MCG tablet Take 150 mcg by mouth daily.      Marland Kitchen linagliptin (TRADJENTA) 5 MG TABS tablet Take 5 mg by mouth daily.      . phenytoin (DILANTIN) 100 MG ER capsule Take 200-300 mg by mouth daily. Takes 200 mg on Monday and Thursdsay. On all other days of the week takes 300 mg.      . simvastatin (ZOCOR) 80 MG tablet Take 80 mg by mouth daily.       Marland Kitchen tiotropium (SPIRIVA) 18 MCG inhalation capsule Place 18 mcg into inhaler and inhale daily.      Marland Kitchen torsemide (DEMADEX) 100 MG tablet Take 100 mg by mouth 2 (two) times daily.       Scheduled: . albuterol  2.5 mg Nebulization Q4H  . amLODipine  5 mg Oral Daily  . budesonide-formoterol  2 puff Inhalation BID  . cloNIDine  0.1 mg Oral TID  . enoxaparin (LOVENOX) injection  40 mg Subcutaneous  Q24H  . influenza vac split quadrivalent PF  0.5 mL Intramuscular Tomorrow-1000  . levofloxacin (LEVAQUIN) IV  500 mg Intravenous Q24H  . levothyroxine  150 mcg Oral Q breakfast  . linagliptin  5 mg Oral Daily  . loratadine  10 mg Oral Daily  . methylPREDNISolone (SOLU-MEDROL) injection  125 mg Intravenous Q6H  . [START ON 08/29/2013] phenytoin  200 mg Oral Custom  . phenytoin  300 mg Oral Custom  . tiotropium  18 mcg Inhalation Daily   Continuous:  PRN:  Assesment: He was admitted  with COPD exacerbation acute respiratory failure. [name] much improved. I think he's ready for discharge. Active Problems:   * No active hospital problems. *    Plan: Discharge home    LOS: 2 days   Ashaunti Treptow L 08/27/2013, 7:51 AM

## 2013-08-27 NOTE — Progress Notes (Signed)
Pt is to be discharged home today. Pt is in NAD, IV is out, all paperwork has been reviewed/discussed with patient, and there are no questions/concerns at this time. Assessment is unchanged from this morning. Pt is to be accompanied downstairs by staff and family via wheelchair.  

## 2013-08-28 NOTE — Discharge Summary (Signed)
Physician Discharge Summary  Patient ID: [name] [name] MRN: [id number] DOB/AGE: May 06, 1946 67 y.o. Primary Care Physician:FANTA,TESFAYE, MD Admit date: 08/25/2013 Discharge date: 08/28/2013    Discharge Diagnoses:  Acute on chronic respiratory failure Active Problems:   * No active hospital problems. *  COPD with exacerbation Acute on chronic systolic heart failure Panhypopituitarism History of craniotomy Hypertension Diabetes    Medication List         albuterol (2.5 MG/3ML) 0.083% nebulizer solution  Commonly known as:  PROVENTIL  Take 3 mLs (2.5 mg total) by nebulization every 6 (six) hours as needed for wheezing.     amLODipine 5 MG tablet  Commonly known as:  NORVASC  Take 1 tablet (5 mg total) by mouth daily.     budesonide-formoterol 160-4.5 MCG/ACT inhaler  Commonly known as:  SYMBICORT  Inhale 2 puffs into the lungs 2 (two) times daily.     cloNIDine 0.1 MG tablet  Commonly known as:  CATAPRES  Take 0.1 mg by mouth 3 (three) times daily.     desmopressin 0.2 MG tablet  Commonly known as:  DDAVP  Take 0.2 mg by mouth 2 (two) times daily.     dexamethasone 0.5 MG tablet  Commonly known as:  DECADRON  Take 0.5 mg by mouth daily.     levofloxacin 500 MG tablet  Commonly known as:  LEVAQUIN  Take 1 tablet (500 mg total) by mouth daily.     levothyroxine 150 MCG tablet  Commonly known as:  SYNTHROID, LEVOTHROID  Take 150 mcg by mouth daily.     linagliptin 5 MG Tabs tablet  Commonly known as:  TRADJENTA  Take 5 mg by mouth daily.     methylPREDNIsolone 4 MG tablet  Commonly known as:  MEDROL DOSPACK  follow package directions     phenytoin 100 MG ER capsule  Commonly known as:  DILANTIN  Take 200-300 mg by mouth daily. Takes 200 mg on Monday and Thursdsay. On all other days of the week takes 300 mg.     simvastatin 80 MG tablet  Commonly known as:  ZOCOR  Take 80 mg by mouth daily.     tiotropium 18 MCG inhalation capsule  Commonly known as:   SPIRIVA  Place 18 mcg into inhaler and inhale daily.     torsemide 100 MG tablet  Commonly known as:  DEMADEX  Take 100 mg by mouth 2 (two) times daily.        Discharged Condition: Improved    Consults: Pulmonary  Significant Diagnostic Studies: Dg Chest Portable 1 View  08/25/2013   *RADIOLOGY REPORT*  Clinical Data: Shortness of breath, history of COPD, hypertension, sleep apnea, former smoker, initial encounter  PORTABLE CHEST - 1 VIEW  Comparison: 03/25/2013; 01/11/2013; 10/26/2012  Findings:  Examination is degraded due to patient body habitus and portable technique.  Grossly unchanged enlarged cardiac silhouette and mediastinal contours.  Mild cephalization of flow without frank evidence of edema. Worsening perihilar and bibasilar opacities, left greater than right, likely atelectasis.  No definite pleural effusion or pneumothorax.  Unchanged bones.  IMPRESSION:  Degraded examination with findings of cardiomegaly, pulmonary venous congestion and worsening bibasilar atelectasis without definite acute cardiopulmonary disease.  Further evaluation with a PA and lateral chest radiograph may be obtained as clinically indicated.   Original Report Authenticated By: Tacey Ruiz, MD    Lab Results: Basic Metabolic Panel:  Recent Labs  40/98/11 1711  NA 140  K 3.7  CL 101  CO2 30  GLUCOSE 111*  BUN 34*  CREATININE 2.50*  CALCIUM 9.3   Liver Function Tests:  Recent Labs  08/25/13 1711  AST 23  ALT 17  ALKPHOS 66  BILITOT 0.3  PROT 7.6  ALBUMIN 3.9     CBC:  Recent Labs  08/25/13 1711  WBC 8.1  NEUTROABS 5.1  HGB 11.9*  HCT 37.4*  MCV 90.8  PLT 168    Recent Results (from the past 240 hour(s))  MRSA PCR SCREENING     Status: None   Collection Time    08/25/13  8:45 PM      Result Value Range Status   MRSA by PCR NEGATIVE  NEGATIVE Final   Comment:            The GeneXpert MRSA Assay (FDA     approved for NASAL specimens     only), is one component of  a     comprehensive MRSA colonization     surveillance program. It is not     intended to diagnose MRSA     infection nor to guide or     monitor treatment for     MRSA infections.     Hospital Course: He was admitted with shortness of breath. He came to the emergency department and was found to be very dyspneic. It was felt this was a combination of COPD with exacerbation and his chronic heart failure. He was treated initially with BiPAP antibiotics and diuretics and improved rapidly. He was transferred to the floor from the step down. At the time of discharge he said he was back to baseline. He had no more increasing shortness of breath. He denied any chest pain. He did not have any swelling. He was able to ambulate without difficulty  Discharge Exam: Blood pressure 129/50, pulse 81, temperature 97.4 F (36.3 C), temperature source Oral, resp. rate 20, height 6\' 1"  (1.854 m), weight 121 kg (266 lb 12.1 oz), SpO2 98.00%. [name] awake and alert. [name] obese. His chest is clear. His heart is regular without gallop.  Disposition: Home we discussed home health services but he does not want that      Discharge Orders   Future Orders Complete By Expires   Discharge patient  As directed    Elevated toilet seat  As directed         Signed: Leul Narramore L Pager [phone]  08/28/2013, 9:34 AM

## 2013-10-20 ENCOUNTER — Other Ambulatory Visit: Payer: Self-pay | Admitting: Cardiology

## 2013-10-20 ENCOUNTER — Encounter: Payer: Self-pay | Admitting: *Deleted

## 2013-10-20 NOTE — Telephone Encounter (Signed)
This encounter was created in error - please disregard.

## 2013-11-08 ENCOUNTER — Encounter (HOSPITAL_COMMUNITY): Payer: Self-pay | Admitting: Emergency Medicine

## 2013-11-08 ENCOUNTER — Emergency Department (HOSPITAL_COMMUNITY): Payer: Medicare Other

## 2013-11-08 ENCOUNTER — Inpatient Hospital Stay (HOSPITAL_COMMUNITY)
Admission: EM | Admit: 2013-11-08 | Discharge: 2013-11-11 | DRG: 292 | Disposition: A | Discharge status: Home / Self Care | Payer: Medicare Other | Attending: Internal Medicine | Admitting: Internal Medicine

## 2013-11-08 DIAGNOSIS — E039 Hypothyroidism, unspecified: Secondary | ICD-10-CM | POA: Diagnosis present

## 2013-11-08 DIAGNOSIS — J441 Chronic obstructive pulmonary disease with (acute) exacerbation: Secondary | ICD-10-CM | POA: Diagnosis present

## 2013-11-08 DIAGNOSIS — N189 Chronic kidney disease, unspecified: Secondary | ICD-10-CM | POA: Diagnosis present

## 2013-11-08 DIAGNOSIS — E119 Type 2 diabetes mellitus without complications: Secondary | ICD-10-CM | POA: Diagnosis present

## 2013-11-08 DIAGNOSIS — I5033 Acute on chronic diastolic (congestive) heart failure: Principal | ICD-10-CM | POA: Diagnosis present

## 2013-11-08 DIAGNOSIS — Z9981 Dependence on supplemental oxygen: Secondary | ICD-10-CM

## 2013-11-08 DIAGNOSIS — Z87891 Personal history of nicotine dependence: Secondary | ICD-10-CM

## 2013-11-08 DIAGNOSIS — E785 Hyperlipidemia, unspecified: Secondary | ICD-10-CM | POA: Diagnosis present

## 2013-11-08 DIAGNOSIS — I129 Hypertensive chronic kidney disease with stage 1 through stage 4 chronic kidney disease, or unspecified chronic kidney disease: Secondary | ICD-10-CM | POA: Diagnosis present

## 2013-11-08 DIAGNOSIS — Z905 Acquired absence of kidney: Secondary | ICD-10-CM

## 2013-11-08 DIAGNOSIS — I509 Heart failure, unspecified: Secondary | ICD-10-CM | POA: Diagnosis present

## 2013-11-08 DIAGNOSIS — G40909 Epilepsy, unspecified, not intractable, without status epilepticus: Secondary | ICD-10-CM | POA: Diagnosis present

## 2013-11-08 DIAGNOSIS — G4730 Sleep apnea, unspecified: Secondary | ICD-10-CM | POA: Diagnosis present

## 2013-11-08 DIAGNOSIS — E230 Hypopituitarism: Secondary | ICD-10-CM | POA: Diagnosis present

## 2013-11-08 DIAGNOSIS — Z79899 Other long term (current) drug therapy: Secondary | ICD-10-CM

## 2013-11-08 LAB — COMPREHENSIVE METABOLIC PANEL
ALT: 17 U/L (ref 0–53)
Albumin: 3.9 g/dL (ref 3.5–5.2)
Alkaline Phosphatase: 65 U/L (ref 39–117)
Calcium: 9.3 mg/dL (ref 8.4–10.5)
Potassium: 4.3 mEq/L (ref 3.5–5.1)
Sodium: 141 mEq/L (ref 135–145)
Total Protein: 7.8 g/dL (ref 6.0–8.3)

## 2013-11-08 LAB — CBC WITH DIFFERENTIAL/PLATELET
Basophils Absolute: 0.1 10*3/uL (ref 0.0–0.1)
Basophils Relative: 1 % (ref 0–1)
Eosinophils Absolute: 1.2 10*3/uL — ABNORMAL HIGH (ref 0.0–0.7)
Eosinophils Relative: 13 % — ABNORMAL HIGH (ref 0–5)
MCH: 29.5 pg (ref 26.0–34.0)
MCHC: 31.9 g/dL (ref 30.0–36.0)
Neutrophils Relative %: 62 % (ref 43–77)
Platelets: 174 10*3/uL (ref 150–400)
RBC: 4.27 MIL/uL (ref 4.22–5.81)
RDW: 17 % — ABNORMAL HIGH (ref 11.5–15.5)

## 2013-11-08 MED ORDER — ALBUTEROL SULFATE (5 MG/ML) 0.5% IN NEBU
5.0000 mg | INHALATION_SOLUTION | Freq: Once | RESPIRATORY_TRACT | Status: AC
  Administered 2013-11-08: 5 mg via RESPIRATORY_TRACT
  Filled 2013-11-08: qty 1

## 2013-11-08 MED ORDER — BUDESONIDE-FORMOTEROL FUMARATE 160-4.5 MCG/ACT IN AERO
2.0000 | INHALATION_SPRAY | Freq: Two times a day (BID) | RESPIRATORY_TRACT | Status: DC
  Administered 2013-11-08 – 2013-11-11 (×6): 2 via RESPIRATORY_TRACT
  Filled 2013-11-08: qty 6

## 2013-11-08 MED ORDER — ASPIRIN 81 MG PO CHEW
324.0000 mg | CHEWABLE_TABLET | Freq: Once | ORAL | Status: AC
  Administered 2013-11-08: 324 mg via ORAL
  Filled 2013-11-08: qty 4

## 2013-11-08 MED ORDER — ALBUTEROL SULFATE (5 MG/ML) 0.5% IN NEBU
2.5000 mg | INHALATION_SOLUTION | RESPIRATORY_TRACT | Status: DC
  Administered 2013-11-08: 2.5 mg via RESPIRATORY_TRACT
  Filled 2013-11-08: qty 0.5

## 2013-11-08 MED ORDER — ATORVASTATIN CALCIUM 40 MG PO TABS
40.0000 mg | ORAL_TABLET | Freq: Every day | ORAL | Status: DC
  Administered 2013-11-08 – 2013-11-10 (×3): 40 mg via ORAL
  Filled 2013-11-08 (×3): qty 1

## 2013-11-08 MED ORDER — IPRATROPIUM BROMIDE 0.02 % IN SOLN
0.5000 mg | Freq: Once | RESPIRATORY_TRACT | Status: AC
  Administered 2013-11-08: 0.5 mg via RESPIRATORY_TRACT
  Filled 2013-11-08: qty 2.5

## 2013-11-08 MED ORDER — METHYLPREDNISOLONE SODIUM SUCC 125 MG IJ SOLR
125.0000 mg | Freq: Once | INTRAMUSCULAR | Status: AC
  Administered 2013-11-08: 125 mg via INTRAVENOUS
  Filled 2013-11-08: qty 2

## 2013-11-08 MED ORDER — IPRATROPIUM BROMIDE 0.02 % IN SOLN
0.5000 mg | RESPIRATORY_TRACT | Status: DC
  Administered 2013-11-08: 0.5 mg via RESPIRATORY_TRACT
  Filled 2013-11-08: qty 2.5

## 2013-11-08 MED ORDER — ENOXAPARIN SODIUM 30 MG/0.3ML ~~LOC~~ SOLN
30.0000 mg | SUBCUTANEOUS | Status: DC
  Administered 2013-11-08: 30 mg via SUBCUTANEOUS
  Filled 2013-11-08: qty 0.3

## 2013-11-08 MED ORDER — OXYMETAZOLINE HCL 0.05 % NA SOLN
2.0000 | Freq: Two times a day (BID) | NASAL | Status: DC | PRN
  Administered 2013-11-08: 2 via NASAL
  Filled 2013-11-08 (×2): qty 15

## 2013-11-08 MED ORDER — FUROSEMIDE 10 MG/ML IJ SOLN
40.0000 mg | Freq: Two times a day (BID) | INTRAMUSCULAR | Status: DC
  Administered 2013-11-08 – 2013-11-09 (×3): 40 mg via INTRAVENOUS
  Filled 2013-11-08 (×3): qty 4

## 2013-11-08 MED ORDER — METHYLPREDNISOLONE SODIUM SUCC 125 MG IJ SOLR
125.0000 mg | Freq: Four times a day (QID) | INTRAMUSCULAR | Status: DC
  Administered 2013-11-08 – 2013-11-10 (×7): 125 mg via INTRAVENOUS
  Filled 2013-11-08 (×7): qty 2

## 2013-11-08 MED ORDER — ONDANSETRON HCL 4 MG/2ML IJ SOLN: 4.0000 mg | Freq: Three times a day (TID) | INTRAMUSCULAR | Status: AC | PRN

## 2013-11-08 MED ORDER — AMLODIPINE BESYLATE 5 MG PO TABS
5.0000 mg | ORAL_TABLET | Freq: Every day | ORAL | Status: DC
  Administered 2013-11-08 – 2013-11-11 (×4): 5 mg via ORAL
  Filled 2013-11-08 (×4): qty 1

## 2013-11-08 MED ORDER — FUROSEMIDE 10 MG/ML IJ SOLN
40.0000 mg | Freq: Once | INTRAMUSCULAR | Status: AC
  Administered 2013-11-08: 40 mg via INTRAVENOUS
  Filled 2013-11-08: qty 4

## 2013-11-08 MED ORDER — IPRATROPIUM BROMIDE 0.02 % IN SOLN
0.5000 mg | RESPIRATORY_TRACT | Status: DC
  Administered 2013-11-08 – 2013-11-09 (×3): 0.5 mg via RESPIRATORY_TRACT
  Filled 2013-11-08 (×3): qty 2.5

## 2013-11-08 MED ORDER — PHENYTOIN SODIUM EXTENDED 100 MG PO CAPS
300.0000 mg | ORAL_CAPSULE | ORAL | Status: DC
  Administered 2013-11-08 – 2013-11-09 (×2): 300 mg via ORAL
  Filled 2013-11-08 (×2): qty 3

## 2013-11-08 MED ORDER — PHENYTOIN SODIUM EXTENDED 100 MG PO CAPS: 200.0000 mg | ORAL_CAPSULE | Freq: Every day | ORAL | Status: DC

## 2013-11-08 MED ORDER — LEVOTHYROXINE SODIUM 75 MCG PO TABS
150.0000 ug | ORAL_TABLET | Freq: Every day | ORAL | Status: DC
  Administered 2013-11-08 – 2013-11-11 (×4): 150 ug via ORAL
  Filled 2013-11-08 (×4): qty 2

## 2013-11-08 MED ORDER — CLONIDINE HCL 0.1 MG PO TABS
0.1000 mg | ORAL_TABLET | Freq: Three times a day (TID) | ORAL | Status: DC
  Administered 2013-11-08 – 2013-11-11 (×9): 0.1 mg via ORAL
  Filled 2013-11-08 (×9): qty 1

## 2013-11-08 MED ORDER — ALBUTEROL SULFATE (5 MG/ML) 0.5% IN NEBU
2.5000 mg | INHALATION_SOLUTION | RESPIRATORY_TRACT | Status: DC
  Administered 2013-11-08 – 2013-11-09 (×3): 2.5 mg via RESPIRATORY_TRACT
  Filled 2013-11-08 (×3): qty 0.5

## 2013-11-08 MED ORDER — DESMOPRESSIN ACETATE 0.2 MG PO TABS
0.2000 mg | ORAL_TABLET | Freq: Two times a day (BID) | ORAL | Status: DC
  Administered 2013-11-08 – 2013-11-11 (×7): 0.2 mg via ORAL
  Filled 2013-11-08 (×7): qty 1

## 2013-11-08 MED ORDER — DEXAMETHASONE 0.5 MG PO TABS: 0.5000 mg | ORAL_TABLET | Freq: Every day | ORAL | Status: DC

## 2013-11-08 MED ORDER — PHENYTOIN SODIUM EXTENDED 100 MG PO CAPS
200.0000 mg | ORAL_CAPSULE | ORAL | Status: DC
  Administered 2013-11-11: 200 mg via ORAL
  Filled 2013-11-08: qty 2

## 2013-11-08 NOTE — ED Provider Notes (Signed)
This chart was scribed for [name] Maw Ward, DO, by Yevette Edwards, ED Scribe. This patient was seen in room APA01/APA01 and the patient's care was started at 6:59 AM.   TIME SEEN: 6:59 AM  CHIEF COMPLAINT: SOB  HPI Comments: [name] [name] is a 67 y.o. male, with a h/o COPD, CHF, HTN, HLD, DM, hypothyroid who presents to the Emergency Department complaining of SOB which began three days ago on Sunday. He wears 2 liters of oxygen at home. The pt also complains of intermittent pain to shoulder which his wife attributes to episodes of SOB.  He also complains of pain to his chest, and he describes the chest pain as "tight."  No radiation.  Worse with activity.  Better with rest and nebs.  He has experienced a non-productive cough as well as leg swelling increased from baseline. He denies a h/o blood clots. He denies any recent hospitalizations, fractures, long flights, surgeries, trauma. He denies any cardiac stents, but he reports he has had an MI previously. The pt is a former smoker.   Dr. Fanta is his PCP.   ROS: See HPI Constitutional: no fever  Eyes: no drainage  ENT: no runny nose   Cardiovascular:  Chest pain, leg swelling  Resp: SOB, non-productive cough GI: no vomiting GU: no dysuria Integumentary: no rash  Allergy: no hives  Musculoskeletal: leg swelling Neurological: no slurred speech ROS otherwise negative  PAST MEDICAL HISTORY/PAST SURGICAL HISTORY:  Past Medical History  Diagnosis Date  . Chronic obstructive pulmonary disease     Chronic bronchitis;  home oxygen; multiple exacerbations  . Hypertension   . Cellulitis of lower leg   . Sleep apnea     Severe on a sleep study in 12/2010  . Hypothyroid     10/2002: TSH-0.43, T4-0.77  . Tobacco abuse, in remission     20  pack years; discontinued 1998  . Hyperlipidemia     No lipid profile available  . 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 Morbid obesity with BMI of 50.0-59.9, adult 10/28/2012  . Seizure disorder     Onset after craniotomy  . Aortic valve disease 10/2012    Prominent diastolic murmur; mild to moderate aortic insufficiency on echocardiogram in 10/2012  . Chronic kidney disease     S/P right nephrectomy for hypernephroma in 2010  . Seizures   . CHF (congestive heart failure)   . Diabetes mellitus without complication     MEDICATIONS:  Prior to Admission medications   Medication Sig Start Date End Date Taking? Authorizing Provider  albuterol (PROVENTIL) (2.5 MG/3ML) 0.083% nebulizer solution Take 3 mLs (2.5 mg total) by nebulization every 6 (six) hours as needed for wheezing. 10/31/12  Yes Fredirick Maudlin, MD  amLODipine (NORVASC) 5 MG tablet TAKE ONE TABLET BY MOUTH DAILY. 10/20/13  Yes Laqueta Linden, MD  budesonide-formoterol (SYMBICORT) 160-4.5 MCG/ACT inhaler Inhale 2 puffs into the lungs 2 (two) times daily.   Yes Historical Provider, MD  cloNIDine (CATAPRES) 0.1 MG tablet Take 0.1 mg by mouth 3 (three) times daily.   Yes Historical Provider, MD  desmopressin (DDAVP) 0.2 MG tablet Take 0.2 mg by mouth 2 (two) times daily.   Yes Historical Provider, MD  dexamethasone (DECADRON) 0.5 MG tablet Take 0.5 mg by mouth daily.   Yes Historical Provider, MD  levofloxacin (LEVAQUIN) 500 MG tablet Take 1 tablet (500 mg total) by mouth  daily. 08/27/13  Yes Fredirick Maudlin, MD  levothyroxine (SYNTHROID, LEVOTHROID) 150 MCG tablet Take 150 mcg by mouth daily.   Yes Historical Provider, MD  linagliptin (TRADJENTA) 5 MG TABS tablet Take 5 mg by mouth daily.   Yes Historical Provider, MD  methylPREDNIsolone (MEDROL DOSPACK) 4 MG tablet follow package directions 08/27/13  Yes Fredirick Maudlin, MD  phenytoin (DILANTIN) 100 MG ER capsule Take 200-300 mg by mouth daily. Takes 200 mg on Monday and Thursdsay. On all other days of the week takes 300 mg.   Yes Historical Provider, MD  simvastatin (ZOCOR)  80 MG tablet Take 80 mg by mouth daily.    Yes Historical Provider, MD  tiotropium (SPIRIVA) 18 MCG inhalation capsule Place 18 mcg into inhaler and inhale daily.   Yes Historical Provider, MD  torsemide (DEMADEX) 100 MG tablet Take 100 mg by mouth 2 (two) times daily.   Yes Historical Provider, MD    ALLERGIES:  No Known Allergies  SOCIAL HISTORY:  History  Substance Use Topics  . Smoking status: Former Smoker    Types: Cigarettes  . Smokeless tobacco: Not on file  . Alcohol Use: No    FAMILY HISTORY: Family History  Problem Relation Age of Onset  . Cancer Mother   . Cancer Father   . Cancer Sister   . Heart failure Sister   . Cancer Brother     EXAM: BP 143/60  Pulse 92  Resp 24  Wt 266 lb (120.657 kg)  SpO2 100% CONSTITUTIONAL: Alert and oriented and responds appropriately to questions. Well-appearing; well-nourished HEAD: Normocephalic EYES: Conjunctivae clear, PERRL ENT: normal nose; no rhinorrhea; moist mucous membranes; pharynx without lesions noted NECK: Supple, no meningismus, no LAD  CARD: RRR; S1 and S2 appreciated; no murmurs, no clicks, no rubs, no gallops RESP: Normal chest excursion without splinting; patient is mildly tachypneic, speaking in short sentences, diffuse expiratory wheeze and mild rhonchi at bases bilaterally with decreased aeration ABD/GI: Normal bowel sounds; non-distended; soft, non-tender, no rebound, no guarding BACK:  The back appears normal and is non-tender to palpation, there is no CVA tenderness EXT: Normal ROM in all joints; non-tender to palpation; 3+ pitting edema to bilateral lower extremities; normal capillary refill; no cyanosis    SKIN: Normal color for age and race; warm NEURO: Moves all extremities equally PSYCH: The patient's mood and manner are appropriate. Grooming and personal hygiene are appropriate.  MEDICAL DECISION MAKING: Patient here with wheezing, rhonchi and signs of volume overload. Suspect COPD exacerbation  versus CHF exacerbation. Will obtain labs, chest x-ray. We'll give nebs, Lasix, Solu-Medrol. Patient may need admission.  EKG shows bifascicular block. No acute ischemic changes.  ED PROGRESS: Patient is still wheezing and tachypneic after 3 DuoNeb's and Lasix. Chest x-ray shows volume overload and BNP is elevated. Troponin is negative. Patient will need admission for continued breathing treatments and diuresis. Will discuss with Dr. Felecia Shelling for admission.     Results for orders placed during the hospital encounter of 11/08/13  CBC WITH DIFFERENTIAL      Result Value Range   WBC 9.0  4.0 - 10.5 K/uL   RBC 4.27  4.22 - 5.81 MIL/uL   Hemoglobin 12.6 (*) 13.0 - 17.0 g/dL   HCT 16.1  09.6 - 04.5 %   MCV 92.5  78.0 - 100.0 fL   MCH 29.5  26.0 - 34.0 pg   MCHC 31.9  30.0 - 36.0 g/dL   RDW 40.9 (*) 81.1 - 91.4 %  Platelets 174  150 - 400 K/uL   Neutrophils Relative % 62  43 - 77 %   Neutro Abs 5.6  1.7 - 7.7 K/uL   Lymphocytes Relative 15  12 - 46 %   Lymphs Abs 1.4  0.7 - 4.0 K/uL   Monocytes Relative 9  3 - 12 %   Monocytes Absolute 0.8  0.1 - 1.0 K/uL   Eosinophils Relative 13 (*) 0 - 5 %   Eosinophils Absolute 1.2 (*) 0.0 - 0.7 K/uL   Basophils Relative 1  0 - 1 %   Basophils Absolute 0.1  0.0 - 0.1 K/uL  COMPREHENSIVE METABOLIC PANEL      Result Value Range   Sodium 141  135 - 145 mEq/L   Potassium 4.3  3.5 - 5.1 mEq/L   Chloride 100  96 - 112 mEq/L   CO2 30  19 - 32 mEq/L   Glucose, Bld 92  70 - 99 mg/dL   BUN 35 (*) 6 - 23 mg/dL   Creatinine, Ser 1.61 (*) 0.50 - 1.35 mg/dL   Calcium 9.3  8.4 - 09.6 mg/dL   Total Protein 7.8  6.0 - 8.3 g/dL   Albumin 3.9  3.5 - 5.2 g/dL   AST 18  0 - 37 U/L   ALT 17  0 - 53 U/L   Alkaline Phosphatase 65  39 - 117 U/L   Total Bilirubin 0.5  0.3 - 1.2 mg/dL   GFR calc non Af Amer 28 (*) >90 mL/min   GFR calc Af Amer 32 (*) >90 mL/min  PRO B NATRIURETIC PEPTIDE      Result Value Range   Pro B Natriuretic peptide (BNP) 5608.0 (*) 0 - 125  pg/mL  TROPONIN I      Result Value Range   Troponin I <0.30  <0.30 ng/mL   Dg Chest Portable 1 View  11/08/2013   CLINICAL DATA:  Shortness of breath, bilateral leg swelling.  EXAM: PORTABLE CHEST - 1 VIEW  COMPARISON:  08/25/2013.  FINDINGS: Stable enlargement of the cardiac silhouette. There is no definite pleural effusion or pneumothorax. There is no focal parenchymal opacity. There is bilateral interstitial thickening.  There are degenerative changes of bilateral AC joints.  IMPRESSION: Overall appearance most concerning for mild congestive failure.   Electronically Signed   By: Elige Ko   On: 11/08/2013 07:26      I personally performed the services described in this documentation, which was scribed in my presence. The recorded information has been reviewed and is accurate.    [name] Maw Ward, DO 11/08/13 [phone]

## 2013-11-08 NOTE — ED Notes (Signed)
Pt reports he started getting SOB yesterday became worse during the night. Pt did use his nebulizer last night before going to bad. Pt states non productive cough.

## 2013-11-08 NOTE — ED Provider Notes (Signed)
MSE was initiated and I personally evaluated the patient and placed orders (if any) at  6:56 AM on November 08, 2013.  The patient appears stable so that the remainder of the MSE may be completed by another provider.  [name] complaining of increasing shortness of breath which started yesterday. He denies chest pain. He has noted increased leg swelling. On exam, lungs have bibasilar crackles and high-pitched wheezes. He has 3+ pretibial edema and 2+ presacral edema. There is partial relief with albuterol with ipratropium. Chest x-ray has been obtained as well as CBC, complete metabolic panel, BNP, and troponin level. ECG is basically unchanged from most recent ECG.  EKG Interpretation    Date/Time:  Tuesday November 08 2013 06:13:12 EST Ventricular Rate:  93 PR Interval:  190 QRS Duration: 156 QT Interval:  422 QTC Calculation: 524 R Axis:   -72 Text Interpretation:  Normal sinus rhythm Possible Left atrial enlargement Right bundle branch block Left anterior fascicular block - Bifascicular block - Abnormal ECG When compared with ECG of 25-Aug-2013 17:19, QRS duration has decreased Minimal criteria for Septal infarct are no longer Present QT has shortened Confirmed by Preston Fleeting  MD, Tyson Parkison ([id number]) on 11/08/2013 6:51:11 AM              Dione Booze, MD 11/08/13 [phone]

## 2013-11-08 NOTE — Progress Notes (Signed)
ANTICOAGULATION CONSULT NOTE - Initial Consult  Pharmacy Consult for Lovenox Indication: VTE prophylaxis  No Known Allergies  Patient Measurements: Weight: 266 lb (120.657 kg)  Vital Signs: Temp: 98.3 F (36.8 C) (12/02 1455) Temp src: Oral (12/02 1455) BP: 140/55 mmHg (12/02 1455) Pulse Rate: 97 (12/02 1455)  Labs:  Recent Labs  11/08/13 0619 11/08/13 0656  HGB 12.6*  --   HCT 39.5  --   PLT 174  --   CREATININE 2.29*  --   TROPONINI  --  <0.30   The CrCl is unknown because both a height and weight (above a minimum accepted value) are required for this calculation.  Medical History: Past Medical History  Diagnosis Date  . Chronic obstructive pulmonary disease     Chronic bronchitis;  home oxygen; multiple exacerbations  . Hypertension   . Cellulitis of lower leg   . Sleep apnea     Severe on a sleep study in 12/2010  . Hypothyroid     10/2002: TSH-0.43, T4-0.77  . Tobacco abuse, in remission     20 pack years; discontinued 1998  . Hyperlipidemia     No lipid profile available  . 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 Morbid obesity with BMI of 50.0-59.9, adult 10/28/2012  . Seizure disorder     Onset after craniotomy  . Aortic valve disease 10/2012    Prominent diastolic murmur; mild to moderate aortic insufficiency on echocardiogram in 10/2012  . Chronic kidney disease     S/P right nephrectomy for hypernephroma in 2010  . Seizures   . CHF (congestive heart failure)   . Diabetes mellitus without complication    Assessment: 66yo obese male with SCR of 2.3.  Normalized ClCr 25-60ml/min.    Goal of Therapy:  VTE prophylaxis Monitor platelets by anticoagulation protocol: Yes   Plan:  Lovenox 30mg  SQ q24hrs (renally adjusted) F/U SCr and adjust dose if needed Monitor CBC  Margo Aye, Quilla Freeze A 11/08/2013,4:13 PM

## 2013-11-09 DIAGNOSIS — I359 Nonrheumatic aortic valve disorder, unspecified: Secondary | ICD-10-CM

## 2013-11-09 LAB — BASIC METABOLIC PANEL
CO2: 28 mEq/L (ref 19–32)
Calcium: 8.9 mg/dL (ref 8.4–10.5)
Chloride: 100 mEq/L (ref 96–112)
GFR calc Af Amer: 34 mL/min — ABNORMAL LOW (ref >90)
GFR calc non Af Amer: 30 mL/min — ABNORMAL LOW (ref >90)
Glucose, Bld: 161 mg/dL — ABNORMAL HIGH (ref 70–99)
Potassium: 4.8 mEq/L (ref 3.5–5.1)
Sodium: 138 mEq/L (ref 135–145)

## 2013-11-09 MED ORDER — CHLORHEXIDINE GLUCONATE CLOTH 2 % EX PADS
6.0000 | MEDICATED_PAD | Freq: Every day | CUTANEOUS | Status: DC
  Administered 2013-11-10 – 2013-11-11 (×2): 6 via TOPICAL

## 2013-11-09 MED ORDER — ALBUTEROL SULFATE (5 MG/ML) 0.5% IN NEBU
2.5000 mg | INHALATION_SOLUTION | Freq: Three times a day (TID) | RESPIRATORY_TRACT | Status: DC
  Administered 2013-11-09 – 2013-11-11 (×6): 2.5 mg via RESPIRATORY_TRACT
  Filled 2013-11-09 (×6): qty 0.5

## 2013-11-09 MED ORDER — ENOXAPARIN SODIUM 40 MG/0.4ML ~~LOC~~ SOLN
40.0000 mg | SUBCUTANEOUS | Status: DC
  Administered 2013-11-09 – 2013-11-10 (×2): 40 mg via SUBCUTANEOUS
  Filled 2013-11-09 (×2): qty 0.4

## 2013-11-09 MED ORDER — IPRATROPIUM BROMIDE 0.02 % IN SOLN
0.5000 mg | Freq: Three times a day (TID) | RESPIRATORY_TRACT | Status: DC
  Administered 2013-11-09 – 2013-11-11 (×6): 0.5 mg via RESPIRATORY_TRACT
  Filled 2013-11-09 (×6): qty 2.5

## 2013-11-09 MED ORDER — MUPIROCIN 2 % EX OINT
1.0000 "application " | TOPICAL_OINTMENT | Freq: Two times a day (BID) | CUTANEOUS | Status: DC
  Administered 2013-11-09 – 2013-11-10 (×3): 1 via NASAL
  Filled 2013-11-09: qty 22

## 2013-11-09 NOTE — Care Management Note (Addendum)
    Page 1 of 1   11/11/2013     9:01:51 AM   CARE MANAGEMENT NOTE 11/11/2013  Patient:  [name] [name], [name] [name]   Account Number:  [id number]  Date Initiated:  11/09/2013  Documentation initiated by:  Sharrie Rothman  Subjective/Objective Assessment:   Pt admitted from home with CHF. Pt lives alone and will return home at discharge. Pt is fairly independent with ADL's. Pt has walker, cane and home O2. Pt has a daughter who is very active in the care of the pt. Pt has set of scales at home.     Action/Plan:   No needs noted at this time.   Anticipated DC Date:  11/11/2013   Anticipated DC Plan:  HOME/SELF CARE      DC Planning Services  CM consult      Choice offered to / List presented to:             Status of service:  Completed, signed off Medicare Important Message given?  YES (If response is "NO", the following Medicare IM given date fields will be blank) Date Medicare IM given:  11/11/2013 Date Additional Medicare IM given:    Discharge Disposition:  HOME/SELF CARE  Per UR Regulation:    If discussed at Long Length of Stay Meetings, dates discussed:    Comments:  11/11/13 0900 Troyce Gieske, RN BSN CM Pt discharged home today. No CM needs noted.  11/09/13 1125 Arlyss Queen, RN BSN CM

## 2013-11-09 NOTE — Progress Notes (Signed)
Subjective: Patient was admitted yesterday due shortness of breath and bilateral leg edema.  He was started on Iv diuretics and iv steroid. [name] diuresing. His leg edema is improving. No headache or chest pain.  Objective: Vital signs in last 24 hours: Temp:  [97.5 F (36.4 C)-98.3 F (36.8 C)] 98.3 F (36.8 C) (12/03 2040) Pulse Rate:  [79-91] 91 (12/03 2040) Resp:  [18-20] 20 (12/03 2040) BP: (130-142)/(59-75) 142/62 mmHg (12/03 2040) SpO2:  [95 %-100 %] 100 % (12/03 2040) Weight change: 3.175 kg (7 lb) Last BM Date: 11/08/13  Intake/Output from previous day: 12/02 0701 - 12/03 0700 In: 600 [P.O.:600] Out: 1700 [Urine:1700]  PHYSICAL EXAM General appearance: alert and no distress Resp: diminished breath sounds bilaterally, rhonchi bilaterally and wheezes bilaterally Cardio: S1, S2 normal GI: soft, non-tender; bowel sounds normal; no masses,  no organomegaly Extremities: 3+++ leg edema  Lab Results:    @labtest @ [facility] No results found for this basename: PHART, PCO2, PO2ART, TCO2, HCO3,  in the last 72 hours CULTURES Recent Results (from the past 240 hour(s))  MRSA PCR SCREENING     Status: Abnormal   Collection Time    11/09/13  4:10 PM      Result Value Range Status   MRSA by PCR POSITIVE (*) NEGATIVE Final   Comment:            The GeneXpert MRSA Assay (FDA     approved for NASAL specimens     only), is one component of a     comprehensive MRSA colonization     surveillance program. It is not     intended to diagnose MRSA     infection nor to guide or     monitor treatment for     MRSA infections.     RESULT CALLED TO, READ BACK BY AND VERIFIED WITH:     GIBSON,K. AT 1853 ON 11/09/2013 BY BAUGHAM,M.   Studies/Results: Dg Chest Portable 1 View  11/08/2013   CLINICAL DATA:  Shortness of breath, bilateral leg swelling.  EXAM: PORTABLE CHEST - 1 VIEW  COMPARISON:  08/25/2013.  FINDINGS: Stable enlargement of the cardiac silhouette. There is no definite pleural  effusion or pneumothorax. There is no focal parenchymal opacity. There is bilateral interstitial thickening.  There are degenerative changes of bilateral AC joints.  IMPRESSION: Overall appearance most concerning for mild congestive failure.   Electronically Signed   By: Elige Ko   On: 11/08/2013 07:26    Medications: I have reviewed the patient's current medications.  Assesment: . Acute on chronic CHf  2.COPD exacerbation  3. Hypertension  4. Panhypopituitarism  5.Chronic renal failure  Active Problems:   CHF (congestive heart failure)    Plan: Continue iv diuretics Continue IV steroid and nebulizer treatment Will monitor BMP Continue regular treatment    LOS: 1 day   Kainoah Bartosiewicz 11/09/2013, 9:40 PM

## 2013-11-09 NOTE — Progress Notes (Signed)
ANTICOAGULATION CONSULT NOTE  Pharmacy Consult for Lovenox Indication: VTE prophylaxis  No Known Allergies  Patient Measurements: Height: 6\' 1"  (185.4 cm) Weight: 273 lb (123.832 kg) IBW/kg (Calculated) : 79.9  Vital Signs: Temp: 98 F (36.7 C) (12/03 0418) Temp src: Oral (12/03 0418) BP: 134/67 mmHg (12/03 0418) Pulse Rate: 79 (12/03 0418)  Labs:  Recent Labs  11/08/13 0619 11/08/13 0656 11/09/13 0502  HGB 12.6*  --   --   HCT 39.5  --   --   PLT 174  --   --   CREATININE 2.29*  --  2.17*  TROPONINI  --  <0.30  --    Estimated Creatinine Clearance: 44.9 ml/min (by C-G formula based on Cr of 2.17).  Medical History: Past Medical History  Diagnosis Date  . Chronic obstructive pulmonary disease     Chronic bronchitis;  home oxygen; multiple exacerbations  . Hypertension   . Cellulitis of lower leg   . Sleep apnea     Severe on a sleep study in 12/2010  . Hypothyroid     10/2002: TSH-0.43, T4-0.77  . Tobacco abuse, in remission     20 pack years; discontinued 1998  . Hyperlipidemia     No lipid profile available  . 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 Morbid obesity with BMI of 50.0-59.9, adult 10/28/2012  . Seizure disorder     Onset after craniotomy  . Aortic valve disease 10/2012    Prominent diastolic murmur; mild to moderate aortic insufficiency on echocardiogram in 10/2012  . Chronic kidney disease     S/P right nephrectomy for hypernephroma in 2010  . Seizures   . CHF (congestive heart failure)   . Diabetes mellitus without complication    Assessment: 66yo obese male to receive Lovenox while hospitalized.   Renal function improving - normalized CrCl now >61ml/min.   No bleeding noted.  CBC stable.    Goal of Therapy:  VTE prophylaxis Monitor platelets by anticoagulation protocol: Yes   Plan:  Lovenox 40mg  SQ q24hrs Pharmacy to sign off.   Please re-consult as needed.    Elson Clan 11/09/2013,8:48 AM
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
## 2013-11-09 NOTE — Progress Notes (Signed)
*  PRELIMINARY RESULTS* Echocardiogram 2D Echocardiogram has been performed.  [name] [name] 11/09/2013, 10:12 AM

## 2013-11-10 LAB — BASIC METABOLIC PANEL
CO2: 26 mEq/L (ref 19–32)
Calcium: 8.7 mg/dL (ref 8.4–10.5)
Creatinine, Ser: 2.26 mg/dL — ABNORMAL HIGH (ref 0.50–1.35)
GFR calc Af Amer: 33 mL/min — ABNORMAL LOW (ref >90)
GFR calc non Af Amer: 28 mL/min — ABNORMAL LOW (ref >90)
Glucose, Bld: 128 mg/dL — ABNORMAL HIGH (ref 70–99)
Potassium: 4.7 mEq/L (ref 3.5–5.1)
Sodium: 134 mEq/L — ABNORMAL LOW (ref 135–145)

## 2013-11-10 MED ORDER — FUROSEMIDE 40 MG PO TABS
40.0000 mg | ORAL_TABLET | Freq: Every day | ORAL | Status: DC
  Administered 2013-11-10: 40 mg via ORAL
  Filled 2013-11-10 (×2): qty 1

## 2013-11-10 MED ORDER — METHYLPREDNISOLONE SODIUM SUCC 125 MG IJ SOLR
60.0000 mg | Freq: Two times a day (BID) | INTRAMUSCULAR | Status: DC
  Administered 2013-11-10 – 2013-11-11 (×2): 60 mg via INTRAVENOUS
  Filled 2013-11-10 (×2): qty 2

## 2013-11-10 NOTE — Clinical Documentation Improvement (Signed)
THIS DOCUMENT IS NOT A PERMANENT PART OF THE MEDICAL RECORD  Please update your documentation with the medical record to reflect your response to this query. If you need help knowing how to do this please call [phone].  11/10/13  Dear Dr. Felecia Shelling [facility] [place],  A review of the patient medical record has revealed the following indicators.  Based on your clinical judgment, please clarify and document in a progress note and/or discharge summary the clinical condition associated with the following supporting information:  Chief complaint: SOB History of multiple exacerbations of COPD; Sleep apnea; tobacco abuse; and CHF  Requires home Oxygen   Treatment: O2 2-4L/m [place] continuous Atrovent neb tid Proventil neb tid Symbicort 2 puffs bid  Possible Clinical Conditions?  Acute Respiratory Failure Acute on Chronic Respiratory Failure Chronic Respiratory Failure Other Condition________________ Cannot Clinically Determine    You may use possible, probable, or suspect with inpatient documentation. possible, probable, suspected diagnoses MUST be documented at the time of discharge  Reviewed:  No response Thank You,  Harless Litten RN Clinical Documentation Specialist: [phone] Health Information Management [place]

## 2013-11-10 NOTE — Clinical Documentation Improvement (Signed)
THIS DOCUMENT IS NOT A PERMANENT PART OF THE MEDICAL RECORD  Please update your documentation with the medical record to reflect your response to this query. If you need help knowing how to do this please call [phone].  11/10/13  Dear Dr. [facility],  A review of the patient medical record has revealed the following indicators the diagnosis of Heart Failure.  Based on your clinical judgment, please clarify and document in a progress note and/or discharge summary the clinical condition associated with the following supporting information:  Please clarify type of CHF  Admitted with Acute on Chronic CHF and COPD exacerbation History of CHF BNP = 5608 EF = 50-55% CXR: IMPRESSION: Overall appearance most concerning for mild congestive failure.  ED PROGRESS: Patient is still wheezing and tachypneic after 3 DuoNeb's and Lasix. Chest x-ray shows volume overload and BNP is elevated. Troponin is negative. Patient will need admission for continued breathing treatments and diuresis  Progress Notes:  Resp: diminished breath sounds bilaterally, rhonchi bilaterally and wheezes bilaterally; Extremities: 3+++ leg edema  Treatments: Lasix 40mg  daily Daily monitoring of BMP   Possible Clinical Conditions?   Acute on Chronic Systolic Congestive Heart Failure Acute on Chronic Diastolic Congestive Heart Failure Acute on Chronic Systolic & Diastolic  Congestive Heart Failure Other Condition________________________________________ Cannot Clinically Determine   Reviewed: Responded in DCS Thank You,  Harless Litten  RN Clinical Documentation Specialist: [phone] Health Information Management [place]

## 2013-11-10 NOTE — Progress Notes (Signed)
Subjective: Patient feels better. His breathing is improving. [name] diuresing well and his leg edema is subsiding.  Objective: Vital signs in last 24 hours: Temp:  [97.5 F (36.4 C)-98.3 F (36.8 C)] 98 F (36.7 C) (12/04 0500) Pulse Rate:  [82-91] 82 (12/04 0500) Resp:  [18-21] 21 (12/04 0500) BP: (130-142)/(62-75) 132/62 mmHg (12/04 0500) SpO2:  [95 %-100 %] 98 % (12/04 0709) Weight change:  Last BM Date: 11/08/13  Intake/Output from previous day: 12/03 0701 - 12/04 0700 In: 720 [P.O.:720] Out: 2150 [Urine:2150]  PHYSICAL EXAM General appearance: alert and no distress Resp: diminished breath sounds bilaterally, rhonchi bilaterally and wheezes bilaterally Cardio: S1, S2 normal GI: soft, non-tender; bowel sounds normal; no masses,  no organomegaly Extremities: 3+++ leg edema  Lab Results:    @labtest @ [facility] No results found for this basename: PHART, PCO2, PO2ART, TCO2, HCO3,  in the last 72 hours CULTURES Recent Results (from the past 240 hour(s))  MRSA PCR SCREENING     Status: Abnormal   Collection Time    11/09/13  4:10 PM      Result Value Range Status   MRSA by PCR POSITIVE (*) NEGATIVE Final   Comment:            The GeneXpert MRSA Assay (FDA     approved for NASAL specimens     only), is one component of a     comprehensive MRSA colonization     surveillance program. It is not     intended to diagnose MRSA     infection nor to guide or     monitor treatment for     MRSA infections.     RESULT CALLED TO, READ BACK BY AND VERIFIED WITH:     GIBSON,K. AT 1853 ON 11/09/2013 BY BAUGHAM,M.   Studies/Results: No results found.  Medications: I have reviewed the patient's current medications.  Assesment: . Acute on chronic CHf  2.COPD exacerbation  3. Hypertension  4. Panhypopituitarism  5.Chronic renal failure  Active Problems:   CHF (congestive heart failure)    Plan: Change diuretics to oral Will taper solumedrol to 60 mg iv q 12 hours Will  monitor BMP Continue regular treatment    LOS: 2 days   Reneta Niehaus 11/10/2013, 8:07 AM

## 2013-11-11 LAB — BASIC METABOLIC PANEL
BUN: 59 mg/dL — ABNORMAL HIGH (ref 6–23)
Calcium: 8.8 mg/dL (ref 8.4–10.5)
Chloride: 96 mEq/L (ref 96–112)
Creatinine, Ser: 2.29 mg/dL — ABNORMAL HIGH (ref 0.50–1.35)
GFR calc Af Amer: 32 mL/min — ABNORMAL LOW (ref >90)
GFR calc non Af Amer: 28 mL/min — ABNORMAL LOW (ref >90)
Potassium: 4.7 mEq/L (ref 3.5–5.1)

## 2013-11-11 MED ORDER — HYDROCODONE-ACETAMINOPHEN 5-325 MG PO TABS: 1.0000 | ORAL_TABLET | Freq: Four times a day (QID) | ORAL | Status: DC | PRN

## 2013-11-11 NOTE — Progress Notes (Signed)
11/11/13 0957 Reviewed discharge instructions with patient. Given copy of instructions, medication list, prescription, f/u appointment scheduled. Reviewed medication list and noted when medications next due. Reviewed heart failure education, given heart failure zone magnet. Pt verbalizes understanding of instructions, when to call MD, importance of daily weights. IV site d/c'd, within normal limits. Pt left floor in stable condition via w/c accompanied by nurse tech. Earnstine Regal, RN

## 2013-11-11 NOTE — Discharge Summary (Signed)
Physician Discharge Summary  Patient ID: [name] [name] MRN: [id number] DOB/AGE: [birth year] 67 y.o. Primary Care Physician:Emira Eubanks, MD Admit date: 11/08/2013 Discharge date: 11/11/2013    Discharge Diagnoses:  1. Acute on chronic diastolic CHf  2.COPD exacerbation  3. Hypertension  4. Panhypopituitarism  5.Chronic renal failure  Active Problems:   CHF (congestive heart failure)     Medication List         albuterol (2.5 MG/3ML) 0.083% nebulizer solution  Commonly known as:  PROVENTIL  Take 3 mLs (2.5 mg total) by nebulization every 6 (six) hours as needed for wheezing.     amLODipine 5 MG tablet  Commonly known as:  NORVASC  TAKE ONE TABLET BY MOUTH DAILY.     budesonide-formoterol 160-4.5 MCG/ACT inhaler  Commonly known as:  SYMBICORT  Inhale 2 puffs into the lungs 2 (two) times daily.     cloNIDine 0.1 MG tablet  Commonly known as:  CATAPRES  Take 0.1 mg by mouth 3 (three) times daily.     desmopressin 0.2 MG tablet  Commonly known as:  DDAVP  Take 0.2 mg by mouth 2 (two) times daily.     dexamethasone 0.5 MG tablet  Commonly known as:  DECADRON  Take 0.5 mg by mouth daily.     HYDROcodone-acetaminophen 5-325 MG per tablet  Commonly known as:  NORCO  Take 1 tablet by mouth every 6 (six) hours as needed for moderate pain.     levothyroxine 150 MCG tablet  Commonly known as:  SYNTHROID, LEVOTHROID  Take 150 mcg by mouth daily.     linagliptin 5 MG Tabs tablet  Commonly known as:  TRADJENTA  Take 5 mg by mouth daily.     phenytoin 100 MG ER capsule  Commonly known as:  DILANTIN  Take 200-300 mg by mouth daily. Takes 200 mg on Monday and Thursdsay. On all other days of the week takes 300 mg.     simvastatin 80 MG tablet  Commonly known as:  ZOCOR  Take 80 mg by mouth daily.     tiotropium 18 MCG inhalation capsule  Commonly known as:  SPIRIVA  Place 18 mcg into inhaler and inhale daily.     torsemide 100 MG tablet  Commonly known as:  DEMADEX   Take 100 mg by mouth 2 (two) times daily.        Discharged Condition: improved    Consults: none  Significant Diagnostic Studies: Dg Chest Portable 1 View  11/08/2013   CLINICAL DATA:  Shortness of breath, bilateral leg swelling.  EXAM: PORTABLE CHEST - 1 VIEW  COMPARISON:  08/25/2013.  FINDINGS: Stable enlargement of the cardiac silhouette. There is no definite pleural effusion or pneumothorax. There is no focal parenchymal opacity. There is bilateral interstitial thickening.  There are degenerative changes of bilateral AC joints.  IMPRESSION: Overall appearance most concerning for mild congestive failure.   Electronically Signed   By: Elige Ko   On: 11/08/2013 07:26    Lab Results: Basic Metabolic Panel:  Recent Labs  40/98/11 0459 11/11/13 0506  NA 134* 135  K 4.7 4.7  CL 96 96  CO2 26 28  GLUCOSE 128* 131*  BUN 56* 59*  CREATININE 2.26* 2.29*  CALCIUM 8.7 8.8   Liver Function Tests: No results found for this basename: AST, ALT, ALKPHOS, BILITOT, PROT, ALBUMIN,  in the last 72 hours   CBC: No results found for this basename: WBC, NEUTROABS, HGB, HCT, MCV, PLT,  in the last 72  hours  Recent Results (from the past 240 hour(s))  MRSA PCR SCREENING     Status: Abnormal   Collection Time    11/09/13  4:10 PM      Result Value Range Status   MRSA by PCR POSITIVE (*) NEGATIVE Final   Comment:            The GeneXpert MRSA Assay (FDA     approved for NASAL specimens     only), is one component of a     comprehensive MRSA colonization     surveillance program. It is not     intended to diagnose MRSA     infection nor to guide or     monitor treatment for     MRSA infections.     RESULT CALLED TO, READ BACK BY AND VERIFIED WITH:     GIBSON,K. AT 1853 ON 11/09/2013 BY [facility] Course:  This is a 67 years old male patient with history of multiple medical illnesses including CHF and COPD was admitted due to shortness of breath and bilateral  pitting leg edema.He was treated with iv diuretics and steroid. Patient improved and discharged on oral diuretics.  Discharge Exam: Blood pressure 120/63, pulse 79, temperature 98.1 F (36.7 C), temperature source Oral, resp. rate 18, height 6\' 1"  (1.854 m), weight 123.832 kg (273 lb), SpO2 97.00%.   Disposition:  home        Follow-up Information   Follow up with [facility], MD In 1 week.   Specialty:  Internal Medicine   Contact information:   [address] [place] [address] [phone]       Signed: Avon Gully  11/11/2013, 7:45 AM
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
## 2014-01-11 ENCOUNTER — Encounter: Payer: Self-pay | Admitting: Adult Health

## 2014-01-11 ENCOUNTER — Ambulatory Visit (INDEPENDENT_AMBULATORY_CARE_PROVIDER_SITE_OTHER): Payer: Medicare HMO | Admitting: Adult Health

## 2014-01-11 VITALS — BP 151/57 | HR 79 | Ht 73.0 in | Wt 246.0 lb

## 2014-01-11 DIAGNOSIS — I509 Heart failure, unspecified: Secondary | ICD-10-CM

## 2014-01-11 DIAGNOSIS — I10 Essential (primary) hypertension: Secondary | ICD-10-CM

## 2014-01-11 NOTE — Assessment & Plan Note (Signed)
Significant abdominal distention with LEE 2+. He will increase his torsemide to 100mg  BID with 50 mg in addtion to usual dose  X 2 days. Will check a BMET and BNP. He will buy batteries for his scale and weigh daily. I will see him again in one week.

## 2014-01-11 NOTE — Assessment & Plan Note (Signed)
Slightly elevated today, with LEE and abdominal distention. He appears to have some fluid overload. Will adjust diuretics.

## 2014-01-11 NOTE — Patient Instructions (Signed)
Your physician recommends that you schedule a follow-up appointment in: next week  Your physician recommends that you return for lab work today. BMET, Pro BNP  Your physician has recommended you make the following change in your medication:   Please take an additional 50 mg of Torsemide  Today and tomorrow with your regualr twice a day doasge.

## 2014-01-11 NOTE — Progress Notes (Deleted)
Name: [name] [name]    DOB: [birth year]  Age: 68 y.o.  MR#: [id number]       PCP:  [name], MD      Insurance: Payor: [place] / Plan: BLUE MEDICARE / Product Type: *No Product type* /   CC:    Chief Complaint  Patient presents with  . Aortic Insuffiency  . Hypertension    VS Filed Vitals:   01/11/14 1314  BP: 151/57  Pulse: 79  Height: 6\' 1"  (1.854 m)  Weight: 246 lb (111.585 kg)    Weights Current Weight  01/11/14 246 lb (111.585 kg)  11/08/13 273 lb (123.832 kg)  08/25/13 266 lb 12.1 oz (121 kg)    Blood Pressure  BP Readings from Last 3 Encounters:  01/11/14 151/57  11/11/13 120/63  08/27/13 129/50     Admit date:  (Not on file) Last encounter with RMR:  Visit date not found   Allergy Review of patient's allergies indicates no known allergies.  Current Outpatient Prescriptions  Medication Sig Dispense Refill  . albuterol (PROVENTIL) (2.5 MG/3ML) 0.083% nebulizer solution Take 3 mLs (2.5 mg total) by nebulization every 6 (six) hours as needed for wheezing.  75 mL  12  . amLODipine (NORVASC) 5 MG tablet TAKE ONE TABLET BY MOUTH DAILY.  30 tablet  6  . budesonide-formoterol (SYMBICORT) 160-4.5 MCG/ACT inhaler Inhale 2 puffs into the lungs 2 (two) times daily.      . cloNIDine (CATAPRES) 0.1 MG tablet Take 0.1 mg by mouth 3 (three) times daily.      Marland Kitchen desmopressin (DDAVP) 0.2 MG tablet Take 0.2 mg by mouth 2 (two) times daily.      Marland Kitchen dexamethasone (DECADRON) 0.5 MG tablet Take 0.5 mg by mouth daily.      Marland Kitchen HYDROcodone-acetaminophen (NORCO) 5-325 MG per tablet Take 1 tablet by mouth every 6 (six) hours as needed for moderate pain.  30 tablet  0  . levothyroxine (SYNTHROID, LEVOTHROID) 150 MCG tablet Take 150 mcg by mouth daily.      Marland Kitchen linagliptin (TRADJENTA) 5 MG TABS tablet Take 5 mg by mouth daily.      Marland Kitchen olmesartan (BENICAR) 20 MG tablet Take 10 mg by mouth daily.      . phenytoin (DILANTIN) 100 MG ER capsule Take 200-300 mg by mouth  daily. Takes 200 mg on Monday and Thursdsay. On all other days of the week takes 300 mg.      . simvastatin (ZOCOR) 80 MG tablet Take 80 mg by mouth daily.       Marland Kitchen tiotropium (SPIRIVA) 18 MCG inhalation capsule Place 18 mcg into inhaler and inhale daily.      Marland Kitchen torsemide (DEMADEX) 100 MG tablet Take 100 mg by mouth 2 (two) times daily.       No current facility-administered medications for this visit.    Discontinued Meds:   There are no discontinued medications.  Patient Active Problem List   Diagnosis Date Noted  . CHF (congestive heart failure) 11/08/2013  . Seizure disorder 03/27/2013  . Diabetes 03/27/2013  . Acute on chronic diastolic congestive heart failure 03/27/2013  . Hypertension   . Chronic kidney disease   . Sleep apnea   . Hypothyroid   . Tobacco abuse, in remission   . Hyperlipidemia   . Panhypopituitarism   . Obese 10/28/2012  . Aortic valve disease 10/08/2012    LABS    Component Value Date/Time   NA 135 11/11/2013  0506   NA 134* 11/10/2013 0459   NA 138 11/09/2013 0502   K 4.7 11/11/2013 0506   K 4.7 11/10/2013 0459   K 4.8 11/09/2013 0502   CL 96 11/11/2013 0506   CL 96 11/10/2013 0459   CL 100 11/09/2013 0502   CO2 28 11/11/2013 0506   CO2 26 11/10/2013 0459   CO2 28 11/09/2013 0502   GLUCOSE 131* 11/11/2013 0506   GLUCOSE 128* 11/10/2013 0459   GLUCOSE 161* 11/09/2013 0502   BUN 59* 11/11/2013 0506   BUN 56* 11/10/2013 0459   BUN 41* 11/09/2013 0502   CREATININE 2.29* 11/11/2013 0506   CREATININE 2.26* 11/10/2013 0459   CREATININE 2.17* 11/09/2013 0502   CREATININE 2.61* 05/13/2013 1345   CREATININE 2.34* 04/07/2013 1345   CREATININE 2.56* 12/21/2012 1450   CALCIUM 8.8 11/11/2013 0506   CALCIUM 8.7 11/10/2013 0459   CALCIUM 8.9 11/09/2013 0502   CALCIUM 8.5 09/09/2012 0536   GFRNONAA 28* 11/11/2013 0506   GFRNONAA 28* 11/10/2013 0459   GFRNONAA 30* 11/09/2013 0502   GFRAA 32* 11/11/2013 0506   GFRAA 33* 11/10/2013 0459   GFRAA 34* 11/09/2013 0502   CMP      Component Value Date/Time   NA 135 11/11/2013 0506   K 4.7 11/11/2013 0506   CL 96 11/11/2013 0506   CO2 28 11/11/2013 0506   GLUCOSE 131* 11/11/2013 0506   BUN 59* 11/11/2013 0506   CREATININE 2.29* 11/11/2013 0506   CREATININE 2.61* 05/13/2013 1345   CALCIUM 8.8 11/11/2013 0506   CALCIUM 8.5 09/09/2012 0536   PROT 7.8 11/08/2013 0619   ALBUMIN 3.9 11/08/2013 0619   AST 18 11/08/2013 0619   ALT 17 11/08/2013 0619   ALKPHOS 65 11/08/2013 0619   BILITOT 0.5 11/08/2013 0619   GFRNONAA 28* 11/11/2013 0506   GFRAA 32* 11/11/2013 0506       Component Value Date/Time   WBC 9.0 11/08/2013 0619   WBC 8.1 08/25/2013 1711   WBC 10.1 05/13/2013 1345   HGB 12.6* 11/08/2013 0619   HGB 11.9* 08/25/2013 1711   HGB 12.0* 05/13/2013 1345   HCT 39.5 11/08/2013 0619   HCT 37.4* 08/25/2013 1711   HCT 36.3* 05/13/2013 1345   MCV 92.5 11/08/2013 0619   MCV 90.8 08/25/2013 1711   MCV 86.8 05/13/2013 1345    Lipid Panel     Component Value Date/Time   CHOL 157 03/22/2013 0517   TRIG 170* 03/22/2013 0517   HDL 41 03/22/2013 0517   CHOLHDL 3.8 03/22/2013 0517   VLDL 34 03/22/2013 0517   LDLCALC 82 03/22/2013 0517    ABG    Component Value Date/Time   PHART 7.354 08/25/2013 1858   PCO2ART 48.9* 08/25/2013 1858   PO2ART 173.0* 08/25/2013 1858   HCO3 26.6* 08/25/2013 1858   TCO2 24.3 08/25/2013 1858   ACIDBASEDEF 0.9 03/21/2011 0830   O2SAT 99.3 08/25/2013 1858     Lab Results  Component Value Date   TSH 0.358 05/13/2013   BNP (last 3 results)  Recent Labs  03/26/13 0640 08/25/13 1711 11/08/13 0619  PROBNP 3180.0* 5414.0* 5608.0*   Cardiac Panel (last 3 results) No results found for this basename: CKTOTAL, CKMB, TROPONINI, RELINDX,  in the last 72 hours  Iron/TIBC/Ferritin No results found for this basename: iron, tibc, ferritin     EKG Orders placed during the hospital encounter of 11/08/13  . ED EKG  . ED EKG  . EKG 12-LEAD  . EKG 12-LEAD  .  EKG     Prior Assessment and Plan Problem List as of 01/11/2014      Cardiovascular and Mediastinum   Hypertension   Last Assessment & Plan   05/13/2013 Office Visit Written 05/13/2013  1:56 PM by Yehuda Savannah, MD     Blood pressure control is excellent. Patient is not clearly experiencing adverse effects related to clonidine. If he does, dose reduction may be possible.    Aortic valve disease   Last Assessment & Plan   05/13/2013 Office Visit Edited 05/15/2013 12:28 PM by Yehuda Savannah, MD     I discussed possible percutaneous options with Dr. Burt Knack, and, he reports that there are none at present.  There are case reports of experimental designs that permit percutaneous aVR for aortic insufficiency, but these are not currently available. Surgical aortic valve replacement may be an option, but certainly is not an attractive one.  As long as the patient maintains his current level of functioning without recurrent pulmonary edema or other serious cardiac issues, medical therapy will be continued.    Acute on chronic diastolic congestive heart failure   Last Assessment & Plan   05/13/2013 Office Visit Written 05/13/2013  1:58 PM by Yehuda Savannah, MD     CHF appears compensated. Chest x-ray in 03/2013 showed hyperinflated lungs and a minimal right pleural effusion. Weight since then has been stable. At his last visit, patient was treated with a total of 40 mg of torsemide per day. Now he is taking 200 mg a day as prescribed by Dr. Luan Pulling with whom I will discuss diuretic adjustments and patient's sleep apnea treatment.    CHF (congestive heart failure)     Endocrine   Hypothyroid   Last Assessment & Plan   05/13/2013 Office Visit Edited 05/15/2013 12:29 PM by Yehuda Savannah, MD     In light of patient's precarious medical condition and chronic symptoms that could reflect hypothyroidism, repeat TFTs will be obtained    Panhypopituitarism   Last Assessment & Plan   12/21/2012 Office Visit Written 12/21/2012  3:10 PM by Yehuda Savannah, MD     Previous  craniotomy and transsphenoidal surgery as well as panhypopituitarism further increases the risk of open heart surgery and will be considered in planning further evaluation and treatment for patient's cardiac problems.    Diabetes     Nervous and Auditory   Seizure disorder     Genitourinary   Chronic kidney disease   Last Assessment & Plan   12/21/2012 Office Visit Written 12/21/2012  3:08 PM by Yehuda Savannah, MD     Moderate chronic renal insufficiency increases the risk of cardiac catheterization. A repeat metabolic profile will be obtained to reassess creatinine.      Other   Obese   Last Assessment & Plan   11/18/2012 Office Visit Written 11/18/2012  3:35 PM by Yehuda Savannah, MD     Fluid retention contributed to excess weight.  Following diuresis, he is no longer classified as morbidly obese.    Sleep apnea   Last Assessment & Plan   11/18/2012 Office Visit Written 11/18/2012  3:28 PM by Yehuda Savannah, MD     Sleep apnea is likely persistent and contributing to patient's multiple medical problems.  A return visit to a sleep specialist will be desirable but will be deferred for the present.    Tobacco abuse, in remission   Hyperlipidemia   Last Assessment & Plan   12/21/2012  Office Visit Written 12/21/2012  3:09 PM by Yehuda Savannah, MD     Lipid profile will be drawn.        Imaging: No results found.

## 2014-01-12 LAB — BASIC METABOLIC PANEL
BUN: 34 mg/dL — AB (ref 6–23)
CO2: 28 mEq/L (ref 19–32)
CREATININE: 2.2 mg/dL — AB (ref 0.50–1.35)
Calcium: 8.8 mg/dL (ref 8.4–10.5)
Chloride: 102 mEq/L (ref 96–112)
GLUCOSE: 88 mg/dL (ref 70–99)
POTASSIUM: 4 meq/L (ref 3.5–5.3)
Sodium: 141 mEq/L (ref 135–145)

## 2014-01-19 ENCOUNTER — Ambulatory Visit (INDEPENDENT_AMBULATORY_CARE_PROVIDER_SITE_OTHER): Payer: Medicare HMO | Admitting: Adult Health

## 2014-01-19 ENCOUNTER — Encounter: Payer: Self-pay | Admitting: Adult Health

## 2014-01-19 VITALS — BP 130/53 | HR 89 | Ht 73.0 in | Wt 275.0 lb

## 2014-01-19 DIAGNOSIS — I509 Heart failure, unspecified: Secondary | ICD-10-CM

## 2014-01-19 DIAGNOSIS — I5033 Acute on chronic diastolic (congestive) heart failure: Secondary | ICD-10-CM

## 2014-01-19 DIAGNOSIS — N189 Chronic kidney disease, unspecified: Secondary | ICD-10-CM

## 2014-01-19 DIAGNOSIS — I10 Essential (primary) hypertension: Secondary | ICD-10-CM

## 2014-01-19 MED ORDER — METOLAZONE 5 MG PO TABS: 5.0000 mg | ORAL_TABLET | Freq: Every day | ORAL | Status: DC

## 2014-01-19 NOTE — Assessment & Plan Note (Addendum)
Blood pressure currently is controlled. We will continue him on clonidine 0.1 mg 3 times a day, amlodipine 5 mg daily. Followup BMET as discussed.

## 2014-01-19 NOTE — Patient Instructions (Signed)
Your physician recommends that you schedule a follow-up appointment in: 2 weeks  Your physician has recommended you make the following change in your medication:  Take Metolazone 5 mg 1 by mouth daily for 3 days.  Please get your blood work on Tuesday 01/24/14

## 2014-01-19 NOTE — Progress Notes (Deleted)
Name: [name] [name]    DOB: [birth year]  Age: 68 y.o.  MR#: [id number]       PCP:  [name], MD      Insurance: Payor: [employer]ARE / Plan: [employer] MEDICARE HMO/PPO / Product Type: *No Product type* /   CC:    Chief Complaint  Patient presents with  . Aortic Insuffiency  . Hypertension  . Congestive Heart Failure    Diastolic   PT WEIGHT NOTED AT 246LB LAST OV HOWEVER PT WEIGHED 3 SEPARATE TIMES TODAY NOTING 275LB AS FINAL, PT LAST 2 OV'S ON 8 AND 9 OF 2014 PT ALSO IN THE 272LB RANGE. VS Filed Vitals:   01/19/14 1353  BP: 130/53  Pulse: 89  Height: 6\' 1"  (1.854 m)  Weight: 275 lb (124.739 kg)  SpO2: 99%    Weights Current Weight  01/19/14 275 lb (124.739 kg)  01/11/14 246 lb (111.585 kg)  11/08/13 273 lb (123.832 kg)    Blood Pressure  BP Readings from Last 3 Encounters:  01/19/14 130/53  01/11/14 151/57  11/11/13 120/63     Admit date:  (Not on file) Last encounter with RMR:  01/11/2014   Allergy Review of patient's allergies indicates no known allergies.  Current Outpatient Prescriptions  Medication Sig Dispense Refill  . albuterol (PROVENTIL) (2.5 MG/3ML) 0.083% nebulizer solution Take 3 mLs (2.5 mg total) by nebulization every 6 (six) hours as needed for wheezing.  75 mL  12  . amLODipine (NORVASC) 5 MG tablet TAKE ONE TABLET BY MOUTH DAILY.  30 tablet  6  . budesonide-formoterol (SYMBICORT) 160-4.5 MCG/ACT inhaler Inhale 2 puffs into the lungs 2 (two) times daily.      . cloNIDine (CATAPRES) 0.1 MG tablet Take 0.1 mg by mouth 3 (three) times daily.      Marland Kitchen desmopressin (DDAVP) 0.2 MG tablet Take 0.2 mg by mouth 2 (two) times daily.      Marland Kitchen dexamethasone (DECADRON) 0.5 MG tablet Take 0.5 mg by mouth daily.      Marland Kitchen HYDROcodone-acetaminophen (NORCO) 5-325 MG per tablet Take 1 tablet by mouth every 6 (six) hours as needed for moderate pain.  30 tablet  0  . levothyroxine (SYNTHROID, LEVOTHROID) 150 MCG tablet Take 150 mcg by mouth daily.      Marland Kitchen linagliptin (TRADJENTA)  5 MG TABS tablet Take 5 mg by mouth daily.      Marland Kitchen olmesartan (BENICAR) 20 MG tablet Take 10 mg by mouth daily.      . phenytoin (DILANTIN) 100 MG ER capsule Take 200-300 mg by mouth daily. Takes 200 mg on Monday and Thursdsay. On all other days of the week takes 300 mg.      . simvastatin (ZOCOR) 80 MG tablet Take 80 mg by mouth daily.       Marland Kitchen tiotropium (SPIRIVA) 18 MCG inhalation capsule Place 18 mcg into inhaler and inhale daily.      Marland Kitchen torsemide (DEMADEX) 100 MG tablet Take 100 mg by mouth 2 (two) times daily.       No current facility-administered medications for this visit.    Discontinued Meds:   There are no discontinued medications.  Patient Active Problem List   Diagnosis Date Noted  . CHF (congestive heart failure) 11/08/2013  . Seizure disorder 03/27/2013  . Diabetes 03/27/2013  . Acute on chronic diastolic congestive heart failure 03/27/2013  . Hypertension   . Chronic kidney disease   . Sleep apnea   . Hypothyroid   . Tobacco abuse,  in remission   . Hyperlipidemia   . Panhypopituitarism   . Obese 10/28/2012  . Aortic valve disease 10/08/2012    LABS    Component Value Date/Time   NA 141 01/11/2014 1411   NA 135 11/11/2013 0506   NA 134* 11/10/2013 0459   K 4.0 01/11/2014 1411   K 4.7 11/11/2013 0506   K 4.7 11/10/2013 0459   CL 102 01/11/2014 1411   CL 96 11/11/2013 0506   CL 96 11/10/2013 0459   CO2 28 01/11/2014 1411   CO2 28 11/11/2013 0506   CO2 26 11/10/2013 0459   GLUCOSE 88 01/11/2014 1411   GLUCOSE 131* 11/11/2013 0506   GLUCOSE 128* 11/10/2013 0459   BUN 34* 01/11/2014 1411   BUN 59* 11/11/2013 0506   BUN 56* 11/10/2013 0459   CREATININE 2.20* 01/11/2014 1411   CREATININE 2.29* 11/11/2013 0506   CREATININE 2.26* 11/10/2013 0459   CREATININE 2.17* 11/09/2013 0502   CREATININE 2.61* 05/13/2013 1345   CREATININE 2.34* 04/07/2013 1345   CALCIUM 8.8 01/11/2014 1411   CALCIUM 8.8 11/11/2013 0506   CALCIUM 8.7 11/10/2013 0459   CALCIUM 8.5 09/09/2012 0536   GFRNONAA 28*  11/11/2013 0506   GFRNONAA 28* 11/10/2013 0459   GFRNONAA 30* 11/09/2013 0502   GFRAA 32* 11/11/2013 0506   GFRAA 33* 11/10/2013 0459   GFRAA 34* 11/09/2013 0502   CMP     Component Value Date/Time   NA 141 01/11/2014 1411   K 4.0 01/11/2014 1411   CL 102 01/11/2014 1411   CO2 28 01/11/2014 1411   GLUCOSE 88 01/11/2014 1411   BUN 34* 01/11/2014 1411   CREATININE 2.20* 01/11/2014 1411   CREATININE 2.29* 11/11/2013 0506   CALCIUM 8.8 01/11/2014 1411   CALCIUM 8.5 09/09/2012 0536   PROT 7.8 11/08/2013 0619   ALBUMIN 3.9 11/08/2013 0619   AST 18 11/08/2013 0619   ALT 17 11/08/2013 0619   ALKPHOS 65 11/08/2013 0619   BILITOT 0.5 11/08/2013 0619   GFRNONAA 28* 11/11/2013 0506   GFRAA 32* 11/11/2013 0506       Component Value Date/Time   WBC 9.0 11/08/2013 0619   WBC 8.1 08/25/2013 1711   WBC 10.1 05/13/2013 1345   HGB 12.6* 11/08/2013 0619   HGB 11.9* 08/25/2013 1711   HGB 12.0* 05/13/2013 1345   HCT 39.5 11/08/2013 0619   HCT 37.4* 08/25/2013 1711   HCT 36.3* 05/13/2013 1345   MCV 92.5 11/08/2013 0619   MCV 90.8 08/25/2013 1711   MCV 86.8 05/13/2013 1345    Lipid Panel     Component Value Date/Time   CHOL 157 03/22/2013 0517   TRIG 170* 03/22/2013 0517   HDL 41 03/22/2013 0517   CHOLHDL 3.8 03/22/2013 0517   VLDL 34 03/22/2013 0517   LDLCALC 82 03/22/2013 0517    ABG    Component Value Date/Time   PHART 7.354 08/25/2013 1858   PCO2ART 48.9* 08/25/2013 1858   PO2ART 173.0* 08/25/2013 1858   HCO3 26.6* 08/25/2013 1858   TCO2 24.3 08/25/2013 1858   ACIDBASEDEF 0.9 03/21/2011 0830   O2SAT 99.3 08/25/2013 1858     Lab Results  Component Value Date   TSH 0.358 05/13/2013   BNP (last 3 results)  Recent Labs  03/26/13 0640 08/25/13 1711 11/08/13 0619  PROBNP 3180.0* 5414.0* 5608.0*   Cardiac Panel (last 3 results) No results found for this basename: CKTOTAL, CKMB, TROPONINI, RELINDX,  in the last 72 hours  Iron/TIBC/Ferritin No results found for  this basename: iron, tibc, ferritin     EKG Orders placed  during the hospital encounter of 11/08/13  . ED EKG  . ED EKG  . EKG 12-LEAD  . EKG 12-LEAD  . EKG     Prior Assessment and Plan Problem List as of 01/19/2014     Cardiovascular and Mediastinum   Hypertension   Last Assessment & Plan   01/11/2014 Office Visit Written 01/11/2014  1:54 PM by Lendon Colonel, NP     Slightly elevated today, with LEE and abdominal distention. He appears to have some fluid overload. Will adjust diuretics.    Aortic valve disease   Last Assessment & Plan   05/13/2013 Office Visit Edited 05/15/2013 12:28 PM by Yehuda Savannah, MD     I discussed possible percutaneous options with Dr. Burt Knack, and, he reports that there are none at present.  There are case reports of experimental designs that permit percutaneous aVR for aortic insufficiency, but these are not currently available. Surgical aortic valve replacement may be an option, but certainly is not an attractive one.  As long as the patient maintains his current level of functioning without recurrent pulmonary edema or other serious cardiac issues, medical therapy will be continued.    Acute on chronic diastolic congestive heart failure   Last Assessment & Plan   05/13/2013 Office Visit Written 05/13/2013  1:58 PM by Yehuda Savannah, MD     CHF appears compensated. Chest x-ray in 03/2013 showed hyperinflated lungs and a minimal right pleural effusion. Weight since then has been stable. At his last visit, patient was treated with a total of 40 mg of torsemide per day. Now he is taking 200 mg a day as prescribed by Dr. Luan Pulling with whom I will discuss diuretic adjustments and patient's sleep apnea treatment.    CHF (congestive heart failure)   Last Assessment & Plan   01/11/2014 Office Visit Written 01/11/2014  1:56 PM by Lendon Colonel, NP     Significant abdominal distention with LEE 2+. He will increase his torsemide to 100mg  BID with 50 mg in addtion to usual dose  X 2 days. Will check a BMET and BNP. He will buy  batteries for his scale and weigh daily. I will see him again in one week.      Endocrine   Hypothyroid   Last Assessment & Plan   05/13/2013 Office Visit Edited 05/15/2013 12:29 PM by Yehuda Savannah, MD     In light of patient's precarious medical condition and chronic symptoms that could reflect hypothyroidism, repeat TFTs will be obtained    Panhypopituitarism   Last Assessment & Plan   12/21/2012 Office Visit Written 12/21/2012  3:10 PM by Yehuda Savannah, MD     Previous craniotomy and transsphenoidal surgery as well as panhypopituitarism further increases the risk of open heart surgery and will be considered in planning further evaluation and treatment for patient's cardiac problems.    Diabetes     Nervous and Auditory   Seizure disorder     Genitourinary   Chronic kidney disease   Last Assessment & Plan   12/21/2012 Office Visit Written 12/21/2012  3:08 PM by Yehuda Savannah, MD     Moderate chronic renal insufficiency increases the risk of cardiac catheterization. A repeat metabolic profile will be obtained to reassess creatinine.      Other   Obese   Last Assessment & Plan   11/18/2012 Office Visit Written 11/18/2012  3:35 PM by Yehuda Savannah, MD     Fluid retention contributed to excess weight.  Following diuresis, he is no longer classified as morbidly obese.    Sleep apnea   Last Assessment & Plan   11/18/2012 Office Visit Written 11/18/2012  3:28 PM by Yehuda Savannah, MD     Sleep apnea is likely persistent and contributing to patient's multiple medical problems.  A return visit to a sleep specialist will be desirable but will be deferred for the present.    Tobacco abuse, in remission   Hyperlipidemia   Last Assessment & Plan   12/21/2012 Office Visit Written 12/21/2012  3:09 PM by Yehuda Savannah, MD     Lipid profile will be drawn.        Imaging: No results found.

## 2014-01-19 NOTE — Assessment & Plan Note (Signed)
He may have worsening renal with high doses of torsemide. He will have temporary use of metolazone for the next 3 days with followup BMET. He may need to be followed by nephrology should his renal function worsened with ongoing diastolic dysfunction.

## 2014-01-19 NOTE — Assessment & Plan Note (Signed)
The patient has gained 30 pounds since last seen in the hospital. The patient's weight was 246 pounds on discharge. [name] currently 275 pounds. He has not changed his eating habits and continues to eat salty foods. He states he continues to take his torsemide as directed. I will have him start metolazone 5 mg daily for the next 3 days. I have asked him to avoid salty foods. If he continues to have some lower extremity edema or abdominal distention, worsening breathing status, he will need to call his are present to the emergency room. He will followup BMET in 2 weeks.  Of concern that this man will have to be readmitted due to his noncompliance issues. He has been readmitted several times for diastolic heart failure and appears fluid overloaded at this time. Try to treat him as an outpatient with increased diuresis. Uncertain how long this will last before he will need readmission with IV diuresis. Education on low-sodium diet has been given. He appears stoic concerning this.
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
## 2014-01-25 LAB — BASIC METABOLIC PANEL
BUN: 83 mg/dL — AB (ref 6–23)
CALCIUM: 9.4 mg/dL (ref 8.4–10.5)
CO2: 37 mEq/L — ABNORMAL HIGH (ref 19–32)
Chloride: 89 mEq/L — ABNORMAL LOW (ref 96–112)
Creat: 2.58 mg/dL — ABNORMAL HIGH (ref 0.50–1.35)
GLUCOSE: 105 mg/dL — AB (ref 70–99)
Potassium: 3.4 mEq/L — ABNORMAL LOW (ref 3.5–5.3)
SODIUM: 136 meq/L (ref 135–145)

## 2014-01-26 MED ORDER — TORSEMIDE 100 MG PO TABS: 50.0000 mg | ORAL_TABLET | Freq: Two times a day (BID) | ORAL | Status: DC

## 2014-01-26 NOTE — Addendum Note (Signed)
Addended by: Truett Mainland on: 01/26/2014 05:04 PM   Modules accepted: Orders

## 2014-01-27 ENCOUNTER — Telehealth: Payer: Self-pay | Admitting: *Deleted

## 2014-01-27 NOTE — Telephone Encounter (Signed)
Directions Per Dr Bronson Ing on Lab work from 2/18: Decrease Torsemide to 50 mg BID. Also sent a referral to Nephrology doctor.(Dr Jana Half). Called pt to make him aware. Pt states he already sees Dr Jana Half, and saw him yesterday.

## 2014-02-06 ENCOUNTER — Telehealth: Payer: Self-pay | Admitting: *Deleted

## 2014-02-06 ENCOUNTER — Encounter: Payer: Medicare HMO | Admitting: Adult Health
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
## 2014-02-09 ENCOUNTER — Encounter: Payer: Self-pay | Admitting: Adult Health

## 2014-02-09 ENCOUNTER — Ambulatory Visit (INDEPENDENT_AMBULATORY_CARE_PROVIDER_SITE_OTHER): Payer: Medicare HMO | Admitting: Adult Health

## 2014-02-09 VITALS — BP 137/46 | HR 69 | Ht 73.0 in | Wt 258.0 lb

## 2014-02-09 DIAGNOSIS — I5033 Acute on chronic diastolic (congestive) heart failure: Secondary | ICD-10-CM

## 2014-02-09 DIAGNOSIS — I509 Heart failure, unspecified: Secondary | ICD-10-CM

## 2014-02-09 DIAGNOSIS — N189 Chronic kidney disease, unspecified: Secondary | ICD-10-CM

## 2014-02-09 MED ORDER — METOLAZONE 5 MG PO TABS: ORAL_TABLET | ORAL | Status: DC

## 2014-02-09 NOTE — Progress Notes (Deleted)
Name: [name] [name]    DOB: 19-Nov-1946  Age: 68 y.o.  MR#: [id number]       PCP:  [name], MD      Insurance: Payor: [employer] MEDICARE / Plan: [employer] MEDICARE HMO/PPO / Product Type: *No Product type* /   CC:    Chief Complaint  Patient presents with  . Congestive Heart Failure  . Hypertension    VS Filed Vitals:   02/09/14 1407  BP: 137/46  Pulse: 69  Height: 6\' 1"  (1.854 m)  Weight: 258 lb (117.028 kg)    Weights Current Weight  02/09/14 258 lb (117.028 kg)  01/19/14 275 lb (124.739 kg)  01/11/14 246 lb (111.585 kg)    Blood Pressure  BP Readings from Last 3 Encounters:  02/09/14 137/46  01/19/14 130/53  01/11/14 151/57     Admit date:  (Not on file) Last encounter with RMR:  01/19/2014   Allergy Review of patient's allergies indicates no known allergies.  Current Outpatient Prescriptions  Medication Sig Dispense Refill  . albuterol (PROVENTIL) (2.5 MG/3ML) 0.083% nebulizer solution Take 3 mLs (2.5 mg total) by nebulization every 6 (six) hours as needed for wheezing.  75 mL  12  . amLODipine (NORVASC) 5 MG tablet TAKE ONE TABLET BY MOUTH DAILY.  30 tablet  6  . budesonide-formoterol (SYMBICORT) 160-4.5 MCG/ACT inhaler Inhale 2 puffs into the lungs 2 (two) times daily.      . cloNIDine (CATAPRES) 0.1 MG tablet Take 0.1 mg by mouth 3 (three) times daily.      Marland Kitchen desmopressin (DDAVP) 0.2 MG tablet Take 0.2 mg by mouth 2 (two) times daily.      Marland Kitchen dexamethasone (DECADRON) 0.5 MG tablet Take 0.5 mg by mouth daily.      Marland Kitchen HYDROcodone-acetaminophen (NORCO) 5-325 MG per tablet Take 1 tablet by mouth every 6 (six) hours as needed for moderate pain.  30 tablet  0  . levothyroxine (SYNTHROID, LEVOTHROID) 150 MCG tablet Take 150 mcg by mouth daily.      Marland Kitchen linagliptin (TRADJENTA) 5 MG TABS tablet Take 5 mg by mouth daily.      . metolazone (ZAROXOLYN) 5 MG tablet Take 1 tablet (5 mg total) by mouth daily.  7 tablet  0  . olmesartan (BENICAR) 20 MG tablet Take 10 mg by mouth daily.       . phenytoin (DILANTIN) 100 MG ER capsule Take 200-300 mg by mouth daily. Takes 200 mg on Monday and Thursdsay. On all other days of the week takes 300 mg.      . simvastatin (ZOCOR) 80 MG tablet Take 80 mg by mouth daily.       Marland Kitchen tiotropium (SPIRIVA) 18 MCG inhalation capsule Place 18 mcg into inhaler and inhale daily.      Marland Kitchen torsemide (DEMADEX) 100 MG tablet Take 0.5 tablets (50 mg total) by mouth 2 (two) times daily.  30 tablet  6   No current facility-administered medications for this visit.    Discontinued Meds:   There are no discontinued medications.  Patient Active Problem List   Diagnosis Date Noted  . Seizure disorder 03/27/2013  . Diabetes 03/27/2013  . Acute on chronic diastolic congestive heart failure 03/27/2013  . Hypertension   . Chronic kidney disease   . Sleep apnea   . Hypothyroid   . Tobacco abuse, in remission   . Hyperlipidemia   . Panhypopituitarism   . Obese 10/28/2012  . Aortic valve disease 10/08/2012    LABS  Component Value Date/Time   NA 136 01/25/2014 1039   NA 141 01/11/2014 1411   NA 135 11/11/2013 0506   K 3.4* 01/25/2014 1039   K 4.0 01/11/2014 1411   K 4.7 11/11/2013 0506   CL 89* 01/25/2014 1039   CL 102 01/11/2014 1411   CL 96 11/11/2013 0506   CO2 37* 01/25/2014 1039   CO2 28 01/11/2014 1411   CO2 28 11/11/2013 0506   GLUCOSE 105* 01/25/2014 1039   GLUCOSE 88 01/11/2014 1411   GLUCOSE 131* 11/11/2013 0506   BUN 83* 01/25/2014 1039   BUN 34* 01/11/2014 1411   BUN 59* 11/11/2013 0506   CREATININE 2.58* 01/25/2014 1039   CREATININE 2.20* 01/11/2014 1411   CREATININE 2.29* 11/11/2013 0506   CREATININE 2.26* 11/10/2013 0459   CREATININE 2.17* 11/09/2013 0502   CREATININE 2.61* 05/13/2013 1345   CALCIUM 9.4 01/25/2014 1039   CALCIUM 8.8 01/11/2014 1411   CALCIUM 8.8 11/11/2013 0506   CALCIUM 8.5 09/09/2012 0536   GFRNONAA 28* 11/11/2013 0506   GFRNONAA 28* 11/10/2013 0459   GFRNONAA 30* 11/09/2013 0502   GFRAA 32* 11/11/2013 0506   GFRAA 33* 11/10/2013 0459    GFRAA 34* 11/09/2013 0502   CMP     Component Value Date/Time   NA 136 01/25/2014 1039   K 3.4* 01/25/2014 1039   CL 89* 01/25/2014 1039   CO2 37* 01/25/2014 1039   GLUCOSE 105* 01/25/2014 1039   BUN 83* 01/25/2014 1039   CREATININE 2.58* 01/25/2014 1039   CREATININE 2.29* 11/11/2013 0506   CALCIUM 9.4 01/25/2014 1039   CALCIUM 8.5 09/09/2012 0536   PROT 7.8 11/08/2013 0619   ALBUMIN 3.9 11/08/2013 0619   AST 18 11/08/2013 0619   ALT 17 11/08/2013 0619   ALKPHOS 65 11/08/2013 0619   BILITOT 0.5 11/08/2013 0619   GFRNONAA 28* 11/11/2013 0506   GFRAA 32* 11/11/2013 0506       Component Value Date/Time   WBC 9.0 11/08/2013 0619   WBC 8.1 08/25/2013 1711   WBC 10.1 05/13/2013 1345   HGB 12.6* 11/08/2013 0619   HGB 11.9* 08/25/2013 1711   HGB 12.0* 05/13/2013 1345   HCT 39.5 11/08/2013 0619   HCT 37.4* 08/25/2013 1711   HCT 36.3* 05/13/2013 1345   MCV 92.5 11/08/2013 0619   MCV 90.8 08/25/2013 1711   MCV 86.8 05/13/2013 1345    Lipid Panel     Component Value Date/Time   CHOL 157 03/22/2013 0517   TRIG 170* 03/22/2013 0517   HDL 41 03/22/2013 0517   CHOLHDL 3.8 03/22/2013 0517   VLDL 34 03/22/2013 0517   LDLCALC 82 03/22/2013 0517    ABG    Component Value Date/Time   PHART 7.354 08/25/2013 1858   PCO2ART 48.9* 08/25/2013 1858   PO2ART 173.0* 08/25/2013 1858   HCO3 26.6* 08/25/2013 1858   TCO2 24.3 08/25/2013 1858   ACIDBASEDEF 0.9 03/21/2011 0830   O2SAT 99.3 08/25/2013 1858     Lab Results  Component Value Date   TSH 0.358 05/13/2013   BNP (last 3 results)  Recent Labs  03/26/13 0640 08/25/13 1711 11/08/13 0619  PROBNP 3180.0* 5414.0* 5608.0*   Cardiac Panel (last 3 results) No results found for this basename: CKTOTAL, CKMB, TROPONINI, RELINDX,  in the last 72 hours  Iron/TIBC/Ferritin No results found for this basename: iron, tibc, ferritin     EKG Orders placed during the hospital encounter of 11/08/13  . ED EKG  . ED EKG  .  EKG 12-LEAD  . EKG 12-LEAD  . EKG     Prior  Assessment and Plan Problem List as of 02/09/2014     Cardiovascular and Mediastinum   Hypertension   Last Assessment & Plan   01/19/2014 Office Visit Edited 01/19/2014  3:51 PM by Lendon Colonel, NP     Blood pressure currently is controlled. We will continue him on clonidine 0.1 mg 3 times a day, amlodipine 5 mg daily. Followup BMET as discussed.    Aortic valve disease   Last Assessment & Plan   05/13/2013 Office Visit Edited 05/15/2013 12:28 PM by Yehuda Savannah, MD     I discussed possible percutaneous options with Dr. Burt Knack, and, he reports that there are none at present.  There are case reports of experimental designs that permit percutaneous aVR for aortic insufficiency, but these are not currently available. Surgical aortic valve replacement may be an option, but certainly is not an attractive one.  As long as the patient maintains his current level of functioning without recurrent pulmonary edema or other serious cardiac issues, medical therapy will be continued.    Acute on chronic diastolic congestive heart failure   Last Assessment & Plan   01/19/2014 Office Visit Written 01/19/2014  3:50 PM by Lendon Colonel, NP     The patient has gained 30 pounds since last seen in the hospital. The patient's weight was 246 pounds on discharge. [name] currently 275 pounds. He has not changed his eating habits and continues to eat salty foods. He states he continues to take his torsemide as directed. I will have him start metolazone 5 mg daily for the next 3 days. I have asked him to avoid salty foods. If he continues to have some lower extremity edema or abdominal distention, worsening breathing status, he will need to call his are present to the emergency room. He will followup BMET in 2 weeks.  Of concern that this man will have to be readmitted due to his noncompliance issues. He has been readmitted several times for diastolic heart failure and appears fluid overloaded at this time. Try to  treat him as an outpatient with increased diuresis. Uncertain how long this will last before he will need readmission with IV diuresis. Education on low-sodium diet has been given. He appears stoic concerning this.      Endocrine   Hypothyroid   Last Assessment & Plan   05/13/2013 Office Visit Edited 05/15/2013 12:29 PM by Yehuda Savannah, MD     In light of patient's precarious medical condition and chronic symptoms that could reflect hypothyroidism, repeat TFTs will be obtained    Panhypopituitarism   Last Assessment & Plan   12/21/2012 Office Visit Written 12/21/2012  3:10 PM by Yehuda Savannah, MD     Previous craniotomy and transsphenoidal surgery as well as panhypopituitarism further increases the risk of open heart surgery and will be considered in planning further evaluation and treatment for patient's cardiac problems.    Diabetes     Nervous and Auditory   Seizure disorder     Genitourinary   Chronic kidney disease   Last Assessment & Plan   01/19/2014 Office Visit Written 01/19/2014  3:52 PM by Lendon Colonel, NP     He may have worsening renal with high doses of torsemide. He will have temporary use of metolazone for the next 3 days with followup BMET. He may need to be followed by nephrology should his renal function  worsened with ongoing diastolic dysfunction.      Other   Obese   Last Assessment & Plan   11/18/2012 Office Visit Written 11/18/2012  3:35 PM by Yehuda Savannah, MD     Fluid retention contributed to excess weight.  Following diuresis, he is no longer classified as morbidly obese.    Sleep apnea   Last Assessment & Plan   11/18/2012 Office Visit Written 11/18/2012  3:28 PM by Yehuda Savannah, MD     Sleep apnea is likely persistent and contributing to patient's multiple medical problems.  A return visit to a sleep specialist will be desirable but will be deferred for the present.    Tobacco abuse, in remission   Hyperlipidemia   Last Assessment &  Plan   12/21/2012 Office Visit Written 12/21/2012  3:09 PM by Yehuda Savannah, MD     Lipid profile will be drawn.        Imaging: No results found.
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
## 2014-02-09 NOTE — Patient Instructions (Signed)
Your physician recommends that you schedule a follow-up appointment in: 3 months with Dr Bronson Ing   Your physician has recommended you make the following change in your medication:   Metolazone take 1 tablet a week as needed for weight gain. Please call our office and let us know when you take Metolazone

## 2014-02-09 NOTE — Assessment & Plan Note (Signed)
[name] followed by nephrology for ongoing labs.

## 2014-02-09 NOTE — Assessment & Plan Note (Signed)
Doing much better. Wt loss of 17 lbs. [name] trying to avoid salt. He will continue to weigh himself daily. I will given him refill on metolazone, but he is to call us before he takes it again. He will inform us if he gains 5 lbs in 2 days.

## 2014-02-14 NOTE — Telephone Encounter (Signed)
error 

## 2014-03-25 ENCOUNTER — Encounter (HOSPITAL_COMMUNITY): Payer: Self-pay | Admitting: Emergency Medicine

## 2014-03-25 ENCOUNTER — Emergency Department (HOSPITAL_COMMUNITY)
Admission: EM | Admit: 2014-03-25 | Discharge: 2014-03-26 | Disposition: A | Discharge status: Home / Self Care | Payer: Medicare HMO | Attending: Emergency Medicine | Admitting: Emergency Medicine

## 2014-03-25 DIAGNOSIS — H918X9 Other specified hearing loss, unspecified ear: Secondary | ICD-10-CM | POA: Insufficient documentation

## 2014-03-25 DIAGNOSIS — J449 Chronic obstructive pulmonary disease, unspecified: Secondary | ICD-10-CM | POA: Insufficient documentation

## 2014-03-25 DIAGNOSIS — G40909 Epilepsy, unspecified, not intractable, without status epilepticus: Secondary | ICD-10-CM | POA: Insufficient documentation

## 2014-03-25 DIAGNOSIS — G4730 Sleep apnea, unspecified: Secondary | ICD-10-CM | POA: Insufficient documentation

## 2014-03-25 DIAGNOSIS — E119 Type 2 diabetes mellitus without complications: Secondary | ICD-10-CM | POA: Insufficient documentation

## 2014-03-25 DIAGNOSIS — Z9981 Dependence on supplemental oxygen: Secondary | ICD-10-CM | POA: Insufficient documentation

## 2014-03-25 DIAGNOSIS — I129 Hypertensive chronic kidney disease with stage 1 through stage 4 chronic kidney disease, or unspecified chronic kidney disease: Secondary | ICD-10-CM | POA: Insufficient documentation

## 2014-03-25 DIAGNOSIS — N189 Chronic kidney disease, unspecified: Secondary | ICD-10-CM | POA: Insufficient documentation

## 2014-03-25 DIAGNOSIS — E039 Hypothyroidism, unspecified: Secondary | ICD-10-CM | POA: Insufficient documentation

## 2014-03-25 DIAGNOSIS — J4489 Other specified chronic obstructive pulmonary disease: Secondary | ICD-10-CM | POA: Insufficient documentation

## 2014-03-25 DIAGNOSIS — Z87891 Personal history of nicotine dependence: Secondary | ICD-10-CM | POA: Insufficient documentation

## 2014-03-25 DIAGNOSIS — IMO0002 Reserved for concepts with insufficient information to code with codable children: Secondary | ICD-10-CM | POA: Insufficient documentation

## 2014-03-25 DIAGNOSIS — I509 Heart failure, unspecified: Secondary | ICD-10-CM | POA: Insufficient documentation

## 2014-03-25 DIAGNOSIS — J3489 Other specified disorders of nose and nasal sinuses: Secondary | ICD-10-CM | POA: Insufficient documentation

## 2014-03-25 DIAGNOSIS — E785 Hyperlipidemia, unspecified: Secondary | ICD-10-CM | POA: Insufficient documentation

## 2014-03-25 DIAGNOSIS — Z79899 Other long term (current) drug therapy: Secondary | ICD-10-CM | POA: Insufficient documentation

## 2014-03-25 DIAGNOSIS — H9209 Otalgia, unspecified ear: Secondary | ICD-10-CM | POA: Insufficient documentation

## 2014-03-25 DIAGNOSIS — Z872 Personal history of diseases of the skin and subcutaneous tissue: Secondary | ICD-10-CM | POA: Insufficient documentation

## 2014-03-25 DIAGNOSIS — H5789 Other specified disorders of eye and adnexa: Secondary | ICD-10-CM | POA: Insufficient documentation

## 2014-03-25 DIAGNOSIS — R0981 Nasal congestion: Secondary | ICD-10-CM

## 2014-03-25 MED ORDER — LORATADINE 10 MG PO TABS: 10.0000 mg | ORAL_TABLET | Freq: Every day | ORAL | Status: DC

## 2014-03-25 MED ORDER — OXYMETAZOLINE HCL 0.05 % NA SOLN
1.0000 | Freq: Once | NASAL | Status: AC
  Administered 2014-03-26: 1 via NASAL
  Filled 2014-03-25: qty 15

## 2014-03-25 MED ORDER — OXYMETAZOLINE HCL 0.05 % NA SOLN
1.0000 | Freq: Two times a day (BID) | NASAL | Status: DC
Start: 2014-03-25 — End: 2014-12-19

## 2014-03-25 MED ORDER — MOMETASONE FUROATE 50 MCG/ACT NA SUSP: 2.0000 | Freq: Every day | NASAL | Status: DC
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
## 2014-03-25 NOTE — ED Notes (Addendum)
Pt states he has had some nasal congestion for 2-3 days and he feels like he can't hear out of both ears. Pt also c/o swollen glands in his neck.

## 2014-03-26 MED ORDER — LORATADINE 10 MG PO TABS
10.0000 mg | ORAL_TABLET | Freq: Once | ORAL | Status: AC
  Administered 2014-03-26: 10 mg via ORAL
  Filled 2014-03-26: qty 1

## 2014-03-26 NOTE — Discharge Instructions (Signed)

## 2014-05-15 ENCOUNTER — Ambulatory Visit (INDEPENDENT_AMBULATORY_CARE_PROVIDER_SITE_OTHER): Payer: Medicare HMO | Admitting: Cardiovascular Disease

## 2014-05-15 ENCOUNTER — Encounter: Payer: Self-pay | Admitting: Cardiovascular Disease

## 2014-05-15 VITALS — BP 158/62 | HR 93 | Ht 73.0 in | Wt 267.0 lb

## 2014-05-15 DIAGNOSIS — N189 Chronic kidney disease, unspecified: Secondary | ICD-10-CM

## 2014-05-15 DIAGNOSIS — I10 Essential (primary) hypertension: Secondary | ICD-10-CM

## 2014-05-15 DIAGNOSIS — I351 Nonrheumatic aortic (valve) insufficiency: Secondary | ICD-10-CM

## 2014-05-15 DIAGNOSIS — I5032 Chronic diastolic (congestive) heart failure: Secondary | ICD-10-CM

## 2014-05-15 DIAGNOSIS — I359 Nonrheumatic aortic valve disorder, unspecified: Secondary | ICD-10-CM

## 2014-05-15 MED ORDER — METOLAZONE 5 MG PO TABS: ORAL_TABLET | ORAL | Status: DC
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
## 2014-05-15 NOTE — Patient Instructions (Addendum)
Your physician wants you to follow-up in: 6 months with Dr.Koneswaran You will receive a reminder letter in the mail two months in advance. If you don't receive a letter, please call our office to schedule the follow-up appointment.    Your physician recommends that you continue on your current medications as directed. Please refer to the Current Medication list given to you today.   I have refilled your metolazone today  Please bring BP cuff in next week to check for correct calibration

## 2014-05-18 ENCOUNTER — Ambulatory Visit (INDEPENDENT_AMBULATORY_CARE_PROVIDER_SITE_OTHER): Payer: Medicare HMO | Admitting: Otolaryngology

## 2014-05-18 DIAGNOSIS — H903 Sensorineural hearing loss, bilateral: Secondary | ICD-10-CM

## 2014-05-18 DIAGNOSIS — T169XXA Foreign body in ear, unspecified ear, initial encounter: Secondary | ICD-10-CM

## 2014-06-11 ENCOUNTER — Emergency Department (HOSPITAL_COMMUNITY): Payer: Medicare HMO

## 2014-06-11 ENCOUNTER — Encounter (HOSPITAL_COMMUNITY): Payer: Self-pay | Admitting: Emergency Medicine

## 2014-06-11 ENCOUNTER — Inpatient Hospital Stay (HOSPITAL_COMMUNITY)
Admission: EM | Admit: 2014-06-11 | Discharge: 2014-06-14 | DRG: 292 | Disposition: A | Discharge status: Home / Self Care | Payer: Medicare HMO | Attending: Internal Medicine | Admitting: Internal Medicine

## 2014-06-11 DIAGNOSIS — J449 Chronic obstructive pulmonary disease, unspecified: Secondary | ICD-10-CM | POA: Diagnosis present

## 2014-06-11 DIAGNOSIS — G40909 Epilepsy, unspecified, not intractable, without status epilepticus: Secondary | ICD-10-CM | POA: Diagnosis present

## 2014-06-11 DIAGNOSIS — Z9981 Dependence on supplemental oxygen: Secondary | ICD-10-CM

## 2014-06-11 DIAGNOSIS — G4730 Sleep apnea, unspecified: Secondary | ICD-10-CM | POA: Diagnosis present

## 2014-06-11 DIAGNOSIS — Z905 Acquired absence of kidney: Secondary | ICD-10-CM

## 2014-06-11 DIAGNOSIS — E038 Other specified hypothyroidism: Secondary | ICD-10-CM

## 2014-06-11 DIAGNOSIS — J439 Emphysema, unspecified: Secondary | ICD-10-CM

## 2014-06-11 DIAGNOSIS — R079 Chest pain, unspecified: Secondary | ICD-10-CM | POA: Diagnosis present

## 2014-06-11 DIAGNOSIS — Z91119 Patient's noncompliance with dietary regimen due to unspecified reason: Secondary | ICD-10-CM

## 2014-06-11 DIAGNOSIS — I151 Hypertension secondary to other renal disorders: Secondary | ICD-10-CM

## 2014-06-11 DIAGNOSIS — N2581 Secondary hyperparathyroidism of renal origin: Secondary | ICD-10-CM | POA: Diagnosis present

## 2014-06-11 DIAGNOSIS — Z85528 Personal history of other malignant neoplasm of kidney: Secondary | ICD-10-CM

## 2014-06-11 DIAGNOSIS — E119 Type 2 diabetes mellitus without complications: Secondary | ICD-10-CM | POA: Diagnosis present

## 2014-06-11 DIAGNOSIS — E785 Hyperlipidemia, unspecified: Secondary | ICD-10-CM | POA: Diagnosis present

## 2014-06-11 DIAGNOSIS — I129 Hypertensive chronic kidney disease with stage 1 through stage 4 chronic kidney disease, or unspecified chronic kidney disease: Secondary | ICD-10-CM | POA: Diagnosis present

## 2014-06-11 DIAGNOSIS — Z87891 Personal history of nicotine dependence: Secondary | ICD-10-CM

## 2014-06-11 DIAGNOSIS — N2889 Other specified disorders of kidney and ureter: Secondary | ICD-10-CM

## 2014-06-11 DIAGNOSIS — I359 Nonrheumatic aortic valve disorder, unspecified: Secondary | ICD-10-CM | POA: Diagnosis present

## 2014-06-11 DIAGNOSIS — I5030 Unspecified diastolic (congestive) heart failure: Secondary | ICD-10-CM

## 2014-06-11 DIAGNOSIS — J4489 Other specified chronic obstructive pulmonary disease: Secondary | ICD-10-CM | POA: Diagnosis present

## 2014-06-11 DIAGNOSIS — I509 Heart failure, unspecified: Secondary | ICD-10-CM | POA: Diagnosis present

## 2014-06-11 DIAGNOSIS — Z6835 Body mass index (BMI) 35.0-35.9, adult: Secondary | ICD-10-CM

## 2014-06-11 DIAGNOSIS — E230 Hypopituitarism: Secondary | ICD-10-CM | POA: Diagnosis present

## 2014-06-11 DIAGNOSIS — I5033 Acute on chronic diastolic (congestive) heart failure: Principal | ICD-10-CM | POA: Diagnosis present

## 2014-06-11 DIAGNOSIS — F17201 Nicotine dependence, unspecified, in remission: Secondary | ICD-10-CM

## 2014-06-11 DIAGNOSIS — Z9111 Patient's noncompliance with dietary regimen: Secondary | ICD-10-CM

## 2014-06-11 DIAGNOSIS — D649 Anemia, unspecified: Secondary | ICD-10-CM | POA: Diagnosis present

## 2014-06-11 DIAGNOSIS — N184 Chronic kidney disease, stage 4 (severe): Secondary | ICD-10-CM | POA: Diagnosis present

## 2014-06-11 DIAGNOSIS — E669 Obesity, unspecified: Secondary | ICD-10-CM

## 2014-06-11 DIAGNOSIS — I351 Nonrheumatic aortic (valve) insufficiency: Secondary | ICD-10-CM

## 2014-06-11 DIAGNOSIS — E138 Other specified diabetes mellitus with unspecified complications: Secondary | ICD-10-CM

## 2014-06-11 DIAGNOSIS — E039 Hypothyroidism, unspecified: Secondary | ICD-10-CM | POA: Diagnosis present

## 2014-06-11 LAB — CBC WITH DIFFERENTIAL/PLATELET
BASOS ABS: 0.1 10*3/uL (ref 0.0–0.1)
Basophils Relative: 1 % (ref 0–1)
Eosinophils Absolute: 0.8 10*3/uL — ABNORMAL HIGH (ref 0.0–0.7)
Eosinophils Relative: 12 % — ABNORMAL HIGH (ref 0–5)
HEMATOCRIT: 36.3 % — AB (ref 39.0–52.0)
Hemoglobin: 11.7 g/dL — ABNORMAL LOW (ref 13.0–17.0)
LYMPHS PCT: 16 % (ref 12–46)
Lymphs Abs: 1.1 10*3/uL (ref 0.7–4.0)
MCH: 29 pg (ref 26.0–34.0)
MCHC: 32.2 g/dL (ref 30.0–36.0)
MCV: 90.1 fL (ref 78.0–100.0)
Monocytes Absolute: 0.5 10*3/uL (ref 0.1–1.0)
Monocytes Relative: 8 % (ref 3–12)
NEUTROS ABS: 4.2 10*3/uL (ref 1.7–7.7)
Neutrophils Relative %: 63 % (ref 43–77)
PLATELETS: 149 10*3/uL — AB (ref 150–400)
RBC: 4.03 MIL/uL — ABNORMAL LOW (ref 4.22–5.81)
RDW: 16.2 % — ABNORMAL HIGH (ref 11.5–15.5)
WBC: 6.7 10*3/uL (ref 4.0–10.5)

## 2014-06-11 LAB — COMPREHENSIVE METABOLIC PANEL
ALBUMIN: 3.6 g/dL (ref 3.5–5.2)
ALK PHOS: 54 U/L (ref 39–117)
ALT: 17 U/L (ref 0–53)
AST: 19 U/L (ref 0–37)
Anion gap: 13 (ref 5–15)
BILIRUBIN TOTAL: 0.4 mg/dL (ref 0.3–1.2)
BUN: 43 mg/dL — ABNORMAL HIGH (ref 6–23)
CHLORIDE: 102 meq/L (ref 96–112)
CO2: 27 mEq/L (ref 19–32)
Calcium: 8.8 mg/dL (ref 8.4–10.5)
Creatinine, Ser: 2.61 mg/dL — ABNORMAL HIGH (ref 0.50–1.35)
GFR calc Af Amer: 27 mL/min — ABNORMAL LOW (ref >90)
GFR calc non Af Amer: 23 mL/min — ABNORMAL LOW (ref >90)
Glucose, Bld: 105 mg/dL — ABNORMAL HIGH (ref 70–99)
POTASSIUM: 4.2 meq/L (ref 3.7–5.3)
SODIUM: 142 meq/L (ref 137–147)
Total Protein: 7.4 g/dL (ref 6.0–8.3)

## 2014-06-11 LAB — PRO B NATRIURETIC PEPTIDE: PRO B NATRI PEPTIDE: 10571 pg/mL — AB (ref 0–125)

## 2014-06-11 LAB — TROPONIN I: Troponin I: <0.3 ng/mL (ref <0.30)

## 2014-06-11 MED ORDER — SODIUM CHLORIDE 0.9 % IV SOLN: INTRAVENOUS | Status: AC

## 2014-06-11 MED ORDER — FUROSEMIDE 10 MG/ML IJ SOLN
40.0000 mg | Freq: Once | INTRAMUSCULAR | Status: AC
  Administered 2014-06-11: 40 mg via INTRAVENOUS
  Filled 2014-06-11: qty 4
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
## 2014-06-11 NOTE — ED Notes (Signed)
Onset of mid chest pain while urinating. increased SOB, took a neb tx w/o relief

## 2014-06-11 NOTE — ED Notes (Signed)
MD at bedside. 

## 2014-06-12 ENCOUNTER — Encounter (HOSPITAL_COMMUNITY): Payer: Self-pay | Admitting: *Deleted

## 2014-06-12 DIAGNOSIS — E785 Hyperlipidemia, unspecified: Secondary | ICD-10-CM

## 2014-06-12 DIAGNOSIS — E118 Type 2 diabetes mellitus with unspecified complications: Secondary | ICD-10-CM

## 2014-06-12 DIAGNOSIS — I5033 Acute on chronic diastolic (congestive) heart failure: Principal | ICD-10-CM

## 2014-06-12 DIAGNOSIS — I359 Nonrheumatic aortic valve disorder, unspecified: Secondary | ICD-10-CM

## 2014-06-12 DIAGNOSIS — N184 Chronic kidney disease, stage 4 (severe): Secondary | ICD-10-CM

## 2014-06-12 DIAGNOSIS — I5030 Unspecified diastolic (congestive) heart failure: Secondary | ICD-10-CM

## 2014-06-12 DIAGNOSIS — R079 Chest pain, unspecified: Secondary | ICD-10-CM

## 2014-06-12 DIAGNOSIS — I509 Heart failure, unspecified: Secondary | ICD-10-CM

## 2014-06-12 LAB — CBC
HCT: 36.2 % — ABNORMAL LOW (ref 39.0–52.0)
HEMOGLOBIN: 11.5 g/dL — AB (ref 13.0–17.0)
MCH: 28.7 pg (ref 26.0–34.0)
MCHC: 31.8 g/dL (ref 30.0–36.0)
MCV: 90.3 fL (ref 78.0–100.0)
Platelets: 145 10*3/uL — ABNORMAL LOW (ref 150–400)
RBC: 4.01 MIL/uL — ABNORMAL LOW (ref 4.22–5.81)
RDW: 16.2 % — AB (ref 11.5–15.5)
WBC: 7.7 10*3/uL (ref 4.0–10.5)

## 2014-06-12 LAB — GLUCOSE, CAPILLARY
GLUCOSE-CAPILLARY: 90 mg/dL (ref 70–99)
GLUCOSE-CAPILLARY: 108 mg/dL — AB (ref 70–99)
Glucose-Capillary: 77 mg/dL (ref 70–99)
Glucose-Capillary: 107 mg/dL — ABNORMAL HIGH (ref 70–99)
Glucose-Capillary: 106 mg/dL — ABNORMAL HIGH (ref 70–99)

## 2014-06-12 LAB — COMPREHENSIVE METABOLIC PANEL
ALBUMIN: 3.8 g/dL (ref 3.5–5.2)
ALK PHOS: 55 U/L (ref 39–117)
ALT: 16 U/L (ref 0–53)
AST: 17 U/L (ref 0–37)
Anion gap: 15 (ref 5–15)
BUN: 44 mg/dL — ABNORMAL HIGH (ref 6–23)
CO2: 26 mEq/L (ref 19–32)
Calcium: 8.9 mg/dL (ref 8.4–10.5)
Chloride: 100 mEq/L (ref 96–112)
Creatinine, Ser: 2.54 mg/dL — ABNORMAL HIGH (ref 0.50–1.35)
GFR calc Af Amer: 28 mL/min — ABNORMAL LOW (ref >90)
GFR calc non Af Amer: 24 mL/min — ABNORMAL LOW (ref >90)
GLUCOSE: 100 mg/dL — AB (ref 70–99)
POTASSIUM: 3.7 meq/L (ref 3.7–5.3)
SODIUM: 141 meq/L (ref 137–147)
TOTAL PROTEIN: 7.7 g/dL (ref 6.0–8.3)
Total Bilirubin: 0.7 mg/dL (ref 0.3–1.2)

## 2014-06-12 LAB — TROPONIN I
Troponin I: <0.3 ng/mL (ref <0.30)
Troponin I: <0.3 ng/mL (ref <0.30)

## 2014-06-12 LAB — MRSA PCR SCREENING: MRSA BY PCR: POSITIVE — AB

## 2014-06-12 MED ORDER — FUROSEMIDE 10 MG/ML IJ SOLN
60.0000 mg | Freq: Two times a day (BID) | INTRAMUSCULAR | Status: DC
  Administered 2014-06-12 – 2014-06-13 (×2): 60 mg via INTRAVENOUS
  Filled 2014-06-12 (×2): qty 6

## 2014-06-12 MED ORDER — ATORVASTATIN CALCIUM 40 MG PO TABS
40.0000 mg | ORAL_TABLET | Freq: Every day | ORAL | Status: DC
  Administered 2014-06-13: 40 mg via ORAL
  Filled 2014-06-12: qty 1

## 2014-06-12 MED ORDER — MORPHINE SULFATE 2 MG/ML IJ SOLN: 2.0000 mg | INTRAMUSCULAR | Status: DC | PRN

## 2014-06-12 MED ORDER — CLONIDINE HCL 0.1 MG PO TABS
0.1000 mg | ORAL_TABLET | Freq: Three times a day (TID) | ORAL | Status: DC
  Administered 2014-06-12 – 2014-06-14 (×7): 0.1 mg via ORAL
  Filled 2014-06-12 (×7): qty 1

## 2014-06-12 MED ORDER — SODIUM CHLORIDE 0.9 % IJ SOLN: 3.0000 mL | INTRAMUSCULAR | Status: DC | PRN

## 2014-06-12 MED ORDER — DEXAMETHASONE 0.5 MG PO TABS
0.5000 mg | ORAL_TABLET | Freq: Every day | ORAL | Status: DC
  Administered 2014-06-12 – 2014-06-14 (×3): 0.5 mg via ORAL
  Filled 2014-06-12 (×3): qty 1

## 2014-06-12 MED ORDER — BUDESONIDE-FORMOTEROL FUMARATE 160-4.5 MCG/ACT IN AERO
2.0000 | INHALATION_SPRAY | Freq: Two times a day (BID) | RESPIRATORY_TRACT | Status: DC
  Administered 2014-06-12 – 2014-06-14 (×4): 2 via RESPIRATORY_TRACT
  Filled 2014-06-12: qty 6

## 2014-06-12 MED ORDER — INSULIN ASPART 100 UNIT/ML ~~LOC~~ SOLN: 0.0000 [IU] | Freq: Three times a day (TID) | SUBCUTANEOUS | Status: DC

## 2014-06-12 MED ORDER — ENOXAPARIN SODIUM 40 MG/0.4ML ~~LOC~~ SOLN
40.0000 mg | SUBCUTANEOUS | Status: DC
  Administered 2014-06-12 – 2014-06-14 (×3): 40 mg via SUBCUTANEOUS
  Filled 2014-06-12 (×4): qty 0.4

## 2014-06-12 MED ORDER — SODIUM CHLORIDE 0.9 % IJ SOLN
3.0000 mL | Freq: Two times a day (BID) | INTRAMUSCULAR | Status: DC
  Administered 2014-06-12 – 2014-06-13 (×4): 3 mL via INTRAVENOUS

## 2014-06-12 MED ORDER — BISACODYL 10 MG RE SUPP
10.0000 mg | Freq: Every day | RECTAL | Status: DC | PRN
  Administered 2014-06-12: 10 mg via RECTAL
  Filled 2014-06-12: qty 1

## 2014-06-12 MED ORDER — ALUM & MAG HYDROXIDE-SIMETH 200-200-20 MG/5ML PO SUSP
30.0000 mL | Freq: Four times a day (QID) | ORAL | Status: DC | PRN
  Administered 2014-06-12: 30 mL via ORAL
  Filled 2014-06-12: qty 30

## 2014-06-12 MED ORDER — HYDRALAZINE HCL 25 MG PO TABS: 25.0000 mg | ORAL_TABLET | Freq: Four times a day (QID) | ORAL | Status: DC | PRN

## 2014-06-12 MED ORDER — DESMOPRESSIN ACETATE 0.2 MG PO TABS
ORAL_TABLET | ORAL | Status: AC
  Filled 2014-06-12: qty 1

## 2014-06-12 MED ORDER — AMLODIPINE BESYLATE 5 MG PO TABS
10.0000 mg | ORAL_TABLET | Freq: Every day | ORAL | Status: DC
  Administered 2014-06-12 – 2014-06-14 (×3): 10 mg via ORAL
  Filled 2014-06-12 (×3): qty 2

## 2014-06-12 MED ORDER — PHENYTOIN SODIUM EXTENDED 100 MG PO CAPS
200.0000 mg | ORAL_CAPSULE | ORAL | Status: DC
  Administered 2014-06-12: 200 mg via ORAL
  Filled 2014-06-12 (×2): qty 2

## 2014-06-12 MED ORDER — MUPIROCIN 2 % EX OINT
1.0000 "application " | TOPICAL_OINTMENT | Freq: Two times a day (BID) | CUTANEOUS | Status: DC
  Administered 2014-06-12 – 2014-06-14 (×5): 1 via NASAL
  Filled 2014-06-12: qty 22

## 2014-06-12 MED ORDER — ACETAMINOPHEN 325 MG PO TABS
650.0000 mg | ORAL_TABLET | Freq: Four times a day (QID) | ORAL | Status: DC | PRN
  Administered 2014-06-12: 650 mg via ORAL
  Filled 2014-06-12: qty 2

## 2014-06-12 MED ORDER — CHLORHEXIDINE GLUCONATE CLOTH 2 % EX PADS: 6.0000 | MEDICATED_PAD | Freq: Every day | CUTANEOUS | Status: DC

## 2014-06-12 MED ORDER — SODIUM CHLORIDE 0.9 % IJ SOLN
3.0000 mL | Freq: Two times a day (BID) | INTRAMUSCULAR | Status: DC
  Administered 2014-06-12 – 2014-06-13 (×3): 3 mL via INTRAVENOUS

## 2014-06-12 MED ORDER — ACETAMINOPHEN 650 MG RE SUPP: 650.0000 mg | Freq: Four times a day (QID) | RECTAL | Status: DC | PRN

## 2014-06-12 MED ORDER — PHENYTOIN SODIUM EXTENDED 100 MG PO CAPS
300.0000 mg | ORAL_CAPSULE | ORAL | Status: DC
  Administered 2014-06-13 – 2014-06-14 (×2): 300 mg via ORAL
  Filled 2014-06-12: qty 3

## 2014-06-12 MED ORDER — HYDRALAZINE HCL 25 MG PO TABS
25.0000 mg | ORAL_TABLET | Freq: Three times a day (TID) | ORAL | Status: DC
  Administered 2014-06-12 – 2014-06-14 (×6): 25 mg via ORAL
  Filled 2014-06-12 (×7): qty 1

## 2014-06-12 MED ORDER — SODIUM CHLORIDE 0.9 % IV SOLN: 250.0000 mL | INTRAVENOUS | Status: DC | PRN

## 2014-06-12 MED ORDER — FUROSEMIDE 10 MG/ML IJ SOLN
20.0000 mg | Freq: Two times a day (BID) | INTRAMUSCULAR | Status: DC
  Administered 2014-06-12: 20 mg via INTRAVENOUS
  Filled 2014-06-12: qty 2

## 2014-06-12 MED ORDER — LEVOTHYROXINE SODIUM 75 MCG PO TABS
150.0000 ug | ORAL_TABLET | Freq: Every day | ORAL | Status: DC
  Administered 2014-06-12 – 2014-06-14 (×3): 150 ug via ORAL
  Filled 2014-06-12 (×3): qty 2

## 2014-06-12 MED ORDER — DESMOPRESSIN ACETATE 0.2 MG PO TABS
0.2000 mg | ORAL_TABLET | Freq: Two times a day (BID) | ORAL | Status: DC
  Administered 2014-06-12 – 2014-06-14 (×6): 0.2 mg via ORAL
  Filled 2014-06-12 (×6): qty 1
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
## 2014-06-12 NOTE — Care Management Utilization Note (Signed)
UR completed 

## 2014-06-12 NOTE — Progress Notes (Signed)
Subjective: Patient was admitted last yesterday due to chest pain and shortness of breath. Patient was found to CHF and started on iv diuretics. Patient feels better this morning. [name] diuresing well.   Objective: Vital signs in last 24 hours: Temp:  [97.6 F (36.4 C)-97.9 F (36.6 C)] 97.6 F (36.4 C) (07/06 0100) Pulse Rate:  [90-93] 93 (07/06 0100) Resp:  [14-28] 24 (07/06 0100) BP: (134-155)/(52-70) 150/65 mmHg (07/06 0100) SpO2:  [97 %-100 %] 97 % (07/06 0100) FiO2 (%):  [28 %] 28 % (07/05 2327) Weight:  [117.935 kg (260 lb)-121.201 kg (267 lb 3.2 oz)] 121.201 kg (267 lb 3.2 oz) (07/06 0100) Weight change:  Last BM Date: 06/09/14  Intake/Output from previous day: 07/05 0701 - 07/06 0700 In: -  Out: 600 [Urine:600]  PHYSICAL EXAM General appearance: alert and no distress Resp: diminished breath sounds bilaterally and rhonchi bilaterally Cardio: S1, S2 normal GI: soft, non-tender; bowel sounds normal; no masses,  no organomegaly Extremities: 2++ leg edema  Lab Results:  Results for orders placed during the hospital encounter of 06/11/14 (from the past 48 hour(s))  CBC WITH DIFFERENTIAL     Status: Abnormal   Collection Time    06/11/14  7:50 PM      Result Value Ref Range   WBC 6.7  4.0 - 10.5 K/uL   RBC 4.03 (*) 4.22 - 5.81 MIL/uL   Hemoglobin 11.7 (*) 13.0 - 17.0 g/dL   HCT 36.3 (*) 39.0 - 52.0 %   MCV 90.1  78.0 - 100.0 fL   MCH 29.0  26.0 - 34.0 pg   MCHC 32.2  30.0 - 36.0 g/dL   RDW 16.2 (*) 11.5 - 15.5 %   Platelets 149 (*) 150 - 400 K/uL   Neutrophils Relative % 63  43 - 77 %   Neutro Abs 4.2  1.7 - 7.7 K/uL   Lymphocytes Relative 16  12 - 46 %   Lymphs Abs 1.1  0.7 - 4.0 K/uL   Monocytes Relative 8  3 - 12 %   Monocytes Absolute 0.5  0.1 - 1.0 K/uL   Eosinophils Relative 12 (*) 0 - 5 %   Eosinophils Absolute 0.8 (*) 0.0 - 0.7 K/uL   Basophils Relative 1  0 - 1 %   Basophils Absolute 0.1  0.0 - 0.1 K/uL  TROPONIN I     Status: None   Collection Time     06/11/14  7:50 PM      Result Value Ref Range   Troponin I <0.30  <0.30 ng/mL   Comment:            Due to the release kinetics of cTnI,     a negative result within the first hours     of the onset of symptoms does not rule out     myocardial infarction with certainty.     If myocardial infarction is still suspected,     repeat the test at appropriate intervals.  COMPREHENSIVE METABOLIC PANEL     Status: Abnormal   Collection Time    06/11/14  7:50 PM      Result Value Ref Range   Sodium 142  137 - 147 mEq/L   Potassium 4.2  3.7 - 5.3 mEq/L   Chloride 102  96 - 112 mEq/L   CO2 27  19 - 32 mEq/L   Glucose, Bld 105 (*) 70 - 99 mg/dL   BUN 43 (*) 6 - 23 mg/dL  Creatinine, Ser 2.61 (*) 0.50 - 1.35 mg/dL   Calcium 8.8  8.4 - 10.5 mg/dL   Total Protein 7.4  6.0 - 8.3 g/dL   Albumin 3.6  3.5 - 5.2 g/dL   AST 19  0 - 37 U/L   ALT 17  0 - 53 U/L   Alkaline Phosphatase 54  39 - 117 U/L   Total Bilirubin 0.4  0.3 - 1.2 mg/dL   GFR calc non Af Amer 23 (*) >90 mL/min   GFR calc Af Amer 27 (*) >90 mL/min   Comment: (NOTE)     The eGFR has been calculated using the CKD EPI equation.     This calculation has not been validated in all clinical situations.     eGFR's persistently <90 mL/min signify possible Chronic Kidney     Disease.   Anion gap 13  5 - 15  PRO B NATRIURETIC PEPTIDE     Status: Abnormal   Collection Time    06/11/14  7:50 PM      Result Value Ref Range   Pro B Natriuretic peptide (BNP) 10571.0 (*) 0 - 125 pg/mL  TROPONIN I     Status: None   Collection Time    06/11/14 10:31 PM      Result Value Ref Range   Troponin I <0.30  <0.30 ng/mL   Comment:            Due to the release kinetics of cTnI,     a negative result within the first hours     of the onset of symptoms does not rule out     myocardial infarction with certainty.     If myocardial infarction is still suspected,     repeat the test at appropriate intervals.  GLUCOSE, CAPILLARY     Status: None    Collection Time    06/12/14  1:35 AM      Result Value Ref Range   Glucose-Capillary 77  70 - 99 mg/dL  CBC     Status: Abnormal   Collection Time    06/12/14  6:31 AM      Result Value Ref Range   WBC 7.7  4.0 - 10.5 K/uL   RBC 4.01 (*) 4.22 - 5.81 MIL/uL   Hemoglobin 11.5 (*) 13.0 - 17.0 g/dL   HCT 36.2 (*) 39.0 - 52.0 %   MCV 90.3  78.0 - 100.0 fL   MCH 28.7  26.0 - 34.0 pg   MCHC 31.8  30.0 - 36.0 g/dL   RDW 16.2 (*) 11.5 - 15.5 %   Platelets 145 (*) 150 - 400 K/uL  COMPREHENSIVE METABOLIC PANEL     Status: Abnormal   Collection Time    06/12/14  6:31 AM      Result Value Ref Range   Sodium 141  137 - 147 mEq/L   Potassium 3.7  3.7 - 5.3 mEq/L   Chloride 100  96 - 112 mEq/L   CO2 26  19 - 32 mEq/L   Glucose, Bld 100 (*) 70 - 99 mg/dL   BUN 44 (*) 6 - 23 mg/dL   Creatinine, Ser 2.54 (*) 0.50 - 1.35 mg/dL   Calcium 8.9  8.4 - 10.5 mg/dL   Total Protein 7.7  6.0 - 8.3 g/dL   Albumin 3.8  3.5 - 5.2 g/dL   AST 17  0 - 37 U/L   ALT 16  0 - 53 U/L   Alkaline Phosphatase 55  39 - 117 U/L   Total Bilirubin 0.7  0.3 - 1.2 mg/dL   GFR calc non Af Amer 24 (*) >90 mL/min   GFR calc Af Amer 28 (*) >90 mL/min   Comment: (NOTE)     The eGFR has been calculated using the CKD EPI equation.     This calculation has not been validated in all clinical situations.     eGFR's persistently <90 mL/min signify possible Chronic Kidney     Disease.   Anion gap 15  5 - 15  TROPONIN I     Status: None   Collection Time    06/12/14  6:31 AM      Result Value Ref Range   Troponin I <0.30  <0.30 ng/mL   Comment:            Due to the release kinetics of cTnI,     a negative result within the first hours     of the onset of symptoms does not rule out     myocardial infarction with certainty.     If myocardial infarction is still suspected,     repeat the test at appropriate intervals.  GLUCOSE, CAPILLARY     Status: None   Collection Time    06/12/14  7:29 AM      Result Value Ref Range    Glucose-Capillary 90  70 - 99 mg/dL   Comment 1 Notify RN      ABGS No results found for this basename: PHART, PCO2, PO2ART, TCO2, HCO3,  in the last 72 hours CULTURES No results found for this or any previous visit (from the past 240 hour(s)). Studies/Results: Dg Chest Portable 1 View  06/11/2014   CLINICAL DATA:  Chest pain, shortness of breath.  EXAM: PORTABLE CHEST - 1 VIEW  COMPARISON:  11/08/2013  FINDINGS: Cardiomegaly with vascular congestion and diffuse interstitial prominence throughout the lungs concerning for edema/CHF. Possible small bilateral effusions. No acute bony abnormality.  IMPRESSION: Findings compatible with mild to moderate CHF.   Electronically Signed   By: Rolm Baptise M.D.   On: 06/11/2014 20:04    Medications: I have reviewed the patient's current medications.  Assesment: Active Problems:   Chronic kidney disease   Seizure disorder   Diabetes   CHF (congestive heart failure)   Chest pain COPD Panhypopituitarism seizure disorder Morbid obesity   Plan: Medications reviewed Continue Iv diuresis BMP daily Echo  Cardiology and nephrology consult    LOS: 1 day   Brookley Spitler 06/12/2014, 8:19 AM

## 2014-06-13 LAB — BASIC METABOLIC PANEL
ANION GAP: 12 (ref 5–15)
BUN: 51 mg/dL — ABNORMAL HIGH (ref 6–23)
CALCIUM: 9.1 mg/dL (ref 8.4–10.5)
CO2: 30 mEq/L (ref 19–32)
Chloride: 98 mEq/L (ref 96–112)
Creatinine, Ser: 2.53 mg/dL — ABNORMAL HIGH (ref 0.50–1.35)
GFR calc non Af Amer: 24 mL/min — ABNORMAL LOW (ref >90)
GFR, EST AFRICAN AMERICAN: 28 mL/min — AB (ref >90)
Glucose, Bld: 94 mg/dL (ref 70–99)
Potassium: 4.5 mEq/L (ref 3.7–5.3)
SODIUM: 140 meq/L (ref 137–147)

## 2014-06-13 LAB — GLUCOSE, CAPILLARY
GLUCOSE-CAPILLARY: 107 mg/dL — AB (ref 70–99)
Glucose-Capillary: 96 mg/dL (ref 70–99)
Glucose-Capillary: 107 mg/dL — ABNORMAL HIGH (ref 70–99)
Glucose-Capillary: 97 mg/dL (ref 70–99)

## 2014-06-13 LAB — PHOSPHORUS: Phosphorus: 4 mg/dL (ref 2.3–4.6)

## 2014-06-13 MED ORDER — TORSEMIDE 20 MG PO TABS
40.0000 mg | ORAL_TABLET | Freq: Every day | ORAL | Status: DC
  Administered 2014-06-13 – 2014-06-14 (×2): 40 mg via ORAL
  Filled 2014-06-13 (×2): qty 2

## 2014-06-13 MED ORDER — LORATADINE 10 MG PO TABS
10.0000 mg | ORAL_TABLET | Freq: Every day | ORAL | Status: DC
  Administered 2014-06-13 – 2014-06-14 (×2): 10 mg via ORAL
  Filled 2014-06-13 (×2): qty 1

## 2014-06-13 NOTE — Progress Notes (Signed)
SUBJECTIVE: Pt feeling much better. Denies SOB.      Intake/Output Summary (Last 24 hours) at 06/13/14 1031 Last data filed at 06/13/14 0435  Gross per 24 hour  Intake    720 ml  Output   2000 ml  Net  -1280 ml    Current Facility-Administered Medications  Medication Dose Route Frequency Provider Last Rate Last Dose  . 0.9 %  sodium chloride infusion  250 mL Intravenous PRN Oswald Hillock, MD      . acetaminophen (TYLENOL) tablet 650 mg  650 mg Oral Q6H PRN Oswald Hillock, MD   650 mg at 06/12/14 1014   Or  . acetaminophen (TYLENOL) suppository 650 mg  650 mg Rectal Q6H PRN Oswald Hillock, MD      . alum & mag hydroxide-simeth (MAALOX/MYLANTA) 200-200-20 MG/5ML suspension 30 mL  30 mL Oral Q6H PRN Oswald Hillock, MD   30 mL at 06/12/14 0307  . amLODipine (NORVASC) tablet 10 mg  10 mg Oral Daily Oswald Hillock, MD   10 mg at 06/12/14 0921  . atorvastatin (LIPITOR) tablet 40 mg  40 mg Oral q1800 Oswald Hillock, MD      . bisacodyl (DULCOLAX) suppository 10 mg  10 mg Rectal Daily PRN Oswald Hillock, MD   10 mg at 06/12/14 0502  . budesonide-formoterol (SYMBICORT) 160-4.5 MCG/ACT inhaler 2 puff  2 puff Inhalation BID Oswald Hillock, MD   2 puff at 06/13/14 0735  . Chlorhexidine Gluconate Cloth 2 % PADS 6 each  6 each Topical Q0600 [name], MD      . cloNIDine (CATAPRES) tablet 0.1 mg  0.1 mg Oral TID Oswald Hillock, MD   0.1 mg at 06/12/14 2149  . desmopressin (DDAVP) tablet 0.2 mg  0.2 mg Oral BID Oswald Hillock, MD   0.2 mg at 06/12/14 2149  . dexamethasone (DECADRON) tablet 0.5 mg  0.5 mg Oral Daily Oswald Hillock, MD   0.5 mg at 06/12/14 1014  . enoxaparin (LOVENOX) injection 40 mg  40 mg Subcutaneous Q24H Oswald Hillock, MD   40 mg at 06/12/14 1000  . hydrALAZINE (APRESOLINE) tablet 25 mg  25 mg Oral 3 times per day Herminio Commons, MD   25 mg at 06/13/14 0643  . insulin aspart (novoLOG) injection 0-9 Units  0-9 Units Subcutaneous TID WC Oswald Hillock, MD      . levothyroxine (SYNTHROID,  LEVOTHROID) tablet 150 mcg  150 mcg Oral QAC breakfast Oswald Hillock, MD   150 mcg at 06/13/14 0845  . loratadine (CLARITIN) tablet 10 mg  10 mg Oral Daily [name], MD      . morphine 2 MG/ML injection 2 mg  2 mg Intravenous Q4H PRN Oswald Hillock, MD      . mupirocin ointment (BACTROBAN) 2 % 1 application  1 application Nasal BID [name], MD   1 application at 86/76/72 2149  . phenytoin (DILANTIN) ER capsule 200 mg  200 mg Oral Once per day on Mon Thu Gagan S Lama, MD   200 mg at 06/12/14 0947  . phenytoin (DILANTIN) ER capsule 300 mg  300 mg Oral Once per day on Sun Tue Wed Fri Sat Oswald Hillock, MD      . sodium chloride 0.9 % injection 3 mL  3 mL Intravenous Q12H Oswald Hillock, MD   3 mL at 06/12/14 2200  . sodium chloride  0.9 % injection 3 mL  3 mL Intravenous Q12H Oswald Hillock, MD   3 mL at 06/12/14 2200  . sodium chloride 0.9 % injection 3 mL  3 mL Intravenous PRN Oswald Hillock, MD      . torsemide (DEMADEX) tablet 40 mg  40 mg Oral Daily Harriett Sine, MD        Filed Vitals:   06/12/14 2016 06/12/14 2255 06/13/14 0433 06/13/14 0737  BP:  127/47 127/51   Pulse:  87 89   Temp:  98.1 F (36.7 C) 97.4 F (36.3 C)   TempSrc:  Oral Oral   Resp:  20 20   Height:      Weight:      SpO2: 99% 100% 97% 93%    PHYSICAL EXAM General-Well developed; no acute distress  Body habitus-obese  Neck- + JVP 8 cm H20.   Lungs-diminished b/l, prolonged expiratory phase, bibasilar rales.  Cardiovascular-normal PMI; normal S1 and S2; grade 2/6 basilar systolic ejection murmur; grade 2/4 basilar diastolic decrescendo murmur  Abdomen-normal bowel sounds; soft and non-tender without masses or organomegaly  Musculoskeletal-No deformities, no cyanosis or clubbing  Neurologic-grossly intact  Skin-Warm, no significant lesions  Extremities-distal pulses intact; 1+ pitting pretibial edema with wrinkled appearance.  LABS: Basic Metabolic Panel:  Recent Labs  06/12/14 0631 06/13/14 0637    NA 141 140  K 3.7 4.5  CL 100 98  CO2 26 30  GLUCOSE 100* 94  BUN 44* 51*  CREATININE 2.54* 2.53*  CALCIUM 8.9 9.1  PHOS  --  4.0   Liver Function Tests:  Recent Labs  06/11/14 1950 06/12/14 0631  AST 19 17  ALT 17 16  ALKPHOS 54 55  BILITOT 0.4 0.7  PROT 7.4 7.7  ALBUMIN 3.6 3.8   No results found for this basename: LIPASE, AMYLASE,  in the last 72 hours CBC:  Recent Labs  06/11/14 1950 06/12/14 0631  WBC 6.7 7.7  NEUTROABS 4.2  --   HGB 11.7* 11.5*  HCT 36.3* 36.2*  MCV 90.1 90.3  PLT 149* 145*   Cardiac Enzymes:  Recent Labs  06/11/14 2231 06/12/14 0631 06/12/14 1022  TROPONINI <0.30 <0.30 <0.30   BNP: No components found with this basename: POCBNP,  D-Dimer: No results found for this basename: DDIMER,  in the last 72 hours Hemoglobin A1C: No results found for this basename: HGBA1C,  in the last 72 hours Fasting Lipid Panel: No results found for this basename: CHOL, HDL, LDLCALC, TRIG, CHOLHDL, LDLDIRECT,  in the last 72 hours Thyroid Function Tests: No results found for this basename: TSH, T4TOTAL, FREET3, T3FREE, THYROIDAB,  in the last 72 hours Anemia Panel: No results found for this basename: VITAMINB12, FOLATE, FERRITIN, TIBC, IRON, RETICCTPCT,  in the last 72 hours  RADIOLOGY: Dg Chest Portable 1 View  06/11/2014   CLINICAL DATA:  Chest pain, shortness of breath.  EXAM: PORTABLE CHEST - 1 VIEW  COMPARISON:  11/08/2013  FINDINGS: Cardiomegaly with vascular congestion and diffuse interstitial prominence throughout the lungs concerning for edema/CHF. Possible small bilateral effusions. No acute bony abnormality.  IMPRESSION: Findings compatible with mild to moderate CHF.   Electronically Signed   By: Rolm Baptise M.D.   On: 06/11/2014 20:04      ASSESSMENT AND PLAN: 1. Acute on chronic diastolic heart failure: I suspect this was exacerbated by dietary noncompliance given his excess sodium use late last week, superimposed on severe aortic  regurgitation. He had been 60 mg IV  bid yesterday and put on approximately 2 liters of fluid. Dr. Lowanda Foster, his nephrologist, has now switched to torsemide 40 mg daily. CKD appears to be at his baseline. I strongly encouraged dietary compliance.  He had been taking torsemide 50 mg bid with metolazone 5 mg prn for leg swelling at home. Thus, I am not certain 40 mg daily will be enough for maintenance, so will need to closely monitor, especially given his severe aortic regurgitation. 2. Severe aortic regurgitation: He will likely require AVR in the near future, which I will arrange for as an outpatient. Will need coronary angiography.  3. HTN: Now better controlled with clonidine, amlodipine, and addition of hydralazine 25 mg tid.  4. CKD stage IV: At baseline, being followed by nephrology.  5. Hyperlipidemia: On Lipitor.  6. COPD: Continue current therapies.    Kate Sable, M.D., F.A.C.C.

## 2014-06-13 NOTE — Progress Notes (Signed)
Subjective: Patient feels better today. [name] complaining of nasal congestion. No fever or chills. His breathing is improving. Objective: Vital signs in last 24 hours: Temp:  [97.4 F (36.3 C)-98.4 F (36.9 C)] 97.4 F (36.3 C) (07/07 0433) Pulse Rate:  [85-89] 89 (07/07 0433) Resp:  [20] 20 (07/07 0433) BP: (123-127)/(47-54) 127/51 mmHg (07/07 0433) SpO2:  [93 %-100 %] 93 % (07/07 0737) Weight change:  Last BM Date: 06/12/14  Intake/Output from previous day: 07/06 0701 - 07/07 0700 In: 960 [P.O.:960] Out: 2400 [Urine:2400]  PHYSICAL EXAM General appearance: alert and no distress Resp: diminished breath sounds bilaterally and rhonchi bilaterally Cardio: S1, S2 normal GI: soft, non-tender; bowel sounds normal; no masses,  no organomegaly Extremities: 1+ leg edema  Lab Results:  Results for orders placed during the hospital encounter of 06/11/14 (from the past 48 hour(s))  CBC WITH DIFFERENTIAL     Status: Abnormal   Collection Time    06/11/14  7:50 PM      Result Value Ref Range   WBC 6.7  4.0 - 10.5 K/uL   RBC 4.03 (*) 4.22 - 5.81 MIL/uL   Hemoglobin 11.7 (*) 13.0 - 17.0 g/dL   HCT 36.3 (*) 39.0 - 52.0 %   MCV 90.1  78.0 - 100.0 fL   MCH 29.0  26.0 - 34.0 pg   MCHC 32.2  30.0 - 36.0 g/dL   RDW 16.2 (*) 11.5 - 15.5 %   Platelets 149 (*) 150 - 400 K/uL   Neutrophils Relative % 63  43 - 77 %   Neutro Abs 4.2  1.7 - 7.7 K/uL   Lymphocytes Relative 16  12 - 46 %   Lymphs Abs 1.1  0.7 - 4.0 K/uL   Monocytes Relative 8  3 - 12 %   Monocytes Absolute 0.5  0.1 - 1.0 K/uL   Eosinophils Relative 12 (*) 0 - 5 %   Eosinophils Absolute 0.8 (*) 0.0 - 0.7 K/uL   Basophils Relative 1  0 - 1 %   Basophils Absolute 0.1  0.0 - 0.1 K/uL  TROPONIN I     Status: None   Collection Time    06/11/14  7:50 PM      Result Value Ref Range   Troponin I <0.30  <0.30 ng/mL   Comment:            Due to the release kinetics of cTnI,     a negative result within the first hours     of the  onset of symptoms does not rule out     myocardial infarction with certainty.     If myocardial infarction is still suspected,     repeat the test at appropriate intervals.  COMPREHENSIVE METABOLIC PANEL     Status: Abnormal   Collection Time    06/11/14  7:50 PM      Result Value Ref Range   Sodium 142  137 - 147 mEq/L   Potassium 4.2  3.7 - 5.3 mEq/L   Chloride 102  96 - 112 mEq/L   CO2 27  19 - 32 mEq/L   Glucose, Bld 105 (*) 70 - 99 mg/dL   BUN 43 (*) 6 - 23 mg/dL   Creatinine, Ser 2.61 (*) 0.50 - 1.35 mg/dL   Calcium 8.8  8.4 - 10.5 mg/dL   Total Protein 7.4  6.0 - 8.3 g/dL   Albumin 3.6  3.5 - 5.2 g/dL   AST 19  0 -  37 U/L   ALT 17  0 - 53 U/L   Alkaline Phosphatase 54  39 - 117 U/L   Total Bilirubin 0.4  0.3 - 1.2 mg/dL   GFR calc non Af Amer 23 (*) >90 mL/min   GFR calc Af Amer 27 (*) >90 mL/min   Comment: (NOTE)     The eGFR has been calculated using the CKD EPI equation.     This calculation has not been validated in all clinical situations.     eGFR's persistently <90 mL/min signify possible Chronic Kidney     Disease.   Anion gap 13  5 - 15  PRO B NATRIURETIC PEPTIDE     Status: Abnormal   Collection Time    06/11/14  7:50 PM      Result Value Ref Range   Pro B Natriuretic peptide (BNP) 10571.0 (*) 0 - 125 pg/mL  TROPONIN I     Status: None   Collection Time    06/11/14 10:31 PM      Result Value Ref Range   Troponin I <0.30  <0.30 ng/mL   Comment:            Due to the release kinetics of cTnI,     a negative result within the first hours     of the onset of symptoms does not rule out     myocardial infarction with certainty.     If myocardial infarction is still suspected,     repeat the test at appropriate intervals.  GLUCOSE, CAPILLARY     Status: None   Collection Time    06/12/14  1:35 AM      Result Value Ref Range   Glucose-Capillary 77  70 - 99 mg/dL  CBC     Status: Abnormal   Collection Time    06/12/14  6:31 AM      Result Value Ref Range    WBC 7.7  4.0 - 10.5 K/uL   RBC 4.01 (*) 4.22 - 5.81 MIL/uL   Hemoglobin 11.5 (*) 13.0 - 17.0 g/dL   HCT 36.2 (*) 39.0 - 52.0 %   MCV 90.3  78.0 - 100.0 fL   MCH 28.7  26.0 - 34.0 pg   MCHC 31.8  30.0 - 36.0 g/dL   RDW 16.2 (*) 11.5 - 15.5 %   Platelets 145 (*) 150 - 400 K/uL  COMPREHENSIVE METABOLIC PANEL     Status: Abnormal   Collection Time    06/12/14  6:31 AM      Result Value Ref Range   Sodium 141  137 - 147 mEq/L   Potassium 3.7  3.7 - 5.3 mEq/L   Chloride 100  96 - 112 mEq/L   CO2 26  19 - 32 mEq/L   Glucose, Bld 100 (*) 70 - 99 mg/dL   BUN 44 (*) 6 - 23 mg/dL   Creatinine, Ser 2.54 (*) 0.50 - 1.35 mg/dL   Calcium 8.9  8.4 - 10.5 mg/dL   Total Protein 7.7  6.0 - 8.3 g/dL   Albumin 3.8  3.5 - 5.2 g/dL   AST 17  0 - 37 U/L   ALT 16  0 - 53 U/L   Alkaline Phosphatase 55  39 - 117 U/L   Total Bilirubin 0.7  0.3 - 1.2 mg/dL   GFR calc non Af Amer 24 (*) >90 mL/min   GFR calc Af Amer 28 (*) >90 mL/min   Comment: (NOTE)  The eGFR has been calculated using the CKD EPI equation.     This calculation has not been validated in all clinical situations.     eGFR's persistently <90 mL/min signify possible Chronic Kidney     Disease.   Anion gap 15  5 - 15  TROPONIN I     Status: None   Collection Time    06/12/14  6:31 AM      Result Value Ref Range   Troponin I <0.30  <0.30 ng/mL   Comment:            Due to the release kinetics of cTnI,     a negative result within the first hours     of the onset of symptoms does not rule out     myocardial infarction with certainty.     If myocardial infarction is still suspected,     repeat the test at appropriate intervals.  GLUCOSE, CAPILLARY     Status: None   Collection Time    06/12/14  7:29 AM      Result Value Ref Range   Glucose-Capillary 90  70 - 99 mg/dL   Comment 1 Notify RN    TROPONIN I     Status: None   Collection Time    06/12/14 10:22 AM      Result Value Ref Range   Troponin I <0.30  <0.30 ng/mL    Comment:            Due to the release kinetics of cTnI,     a negative result within the first hours     of the onset of symptoms does not rule out     myocardial infarction with certainty.     If myocardial infarction is still suspected,     repeat the test at appropriate intervals.  MRSA PCR SCREENING     Status: Abnormal   Collection Time    06/12/14 10:53 AM      Result Value Ref Range   MRSA by PCR POSITIVE (*) NEGATIVE   Comment:            The GeneXpert MRSA Assay (FDA     approved for NASAL specimens     only), is one component of a     comprehensive MRSA colonization     surveillance program. It is not     intended to diagnose MRSA     infection nor to guide or     monitor treatment for     MRSA infections.     CRITICAL RESULT CALLED TO, READ BACK BY AND VERIFIED WITH:     FOOT L ON 06/12/14 AT 1240 BY HAMLETT P  GLUCOSE, CAPILLARY     Status: Abnormal   Collection Time    06/12/14 11:42 AM      Result Value Ref Range   Glucose-Capillary 107 (*) 70 - 99 mg/dL   Comment 1 Notify RN    GLUCOSE, CAPILLARY     Status: Abnormal   Collection Time    06/12/14  4:12 PM      Result Value Ref Range   Glucose-Capillary 108 (*) 70 - 99 mg/dL   Comment 1 Notify RN    GLUCOSE, CAPILLARY     Status: Abnormal   Collection Time    06/12/14 10:54 PM      Result Value Ref Range   Glucose-Capillary 106 (*) 70 - 99 mg/dL  BASIC METABOLIC PANEL     Status:  Abnormal   Collection Time    06/13/14  6:37 AM      Result Value Ref Range   Sodium 140  137 - 147 mEq/L   Potassium 4.5  3.7 - 5.3 mEq/L   Comment: DELTA CHECK NOTED   Chloride 98  96 - 112 mEq/L   CO2 30  19 - 32 mEq/L   Glucose, Bld 94  70 - 99 mg/dL   BUN 51 (*) 6 - 23 mg/dL   Creatinine, Ser 2.53 (*) 0.50 - 1.35 mg/dL   Calcium 9.1  8.4 - 10.5 mg/dL   GFR calc non Af Amer 24 (*) >90 mL/min   GFR calc Af Amer 28 (*) >90 mL/min   Comment: (NOTE)     The eGFR has been calculated using the CKD EPI equation.     This  calculation has not been validated in all clinical situations.     eGFR's persistently <90 mL/min signify possible Chronic Kidney     Disease.   Anion gap 12  5 - 15  PHOSPHORUS     Status: None   Collection Time    06/13/14  6:37 AM      Result Value Ref Range   Phosphorus 4.0  2.3 - 4.6 mg/dL  GLUCOSE, CAPILLARY     Status: None   Collection Time    06/13/14  7:44 AM      Result Value Ref Range   Glucose-Capillary 96  70 - 99 mg/dL   Comment 1 Documented in Chart     Comment 2 Notify RN      ABGS No results found for this basename: PHART, PCO2, PO2ART, TCO2, HCO3,  in the last 72 hours CULTURES Recent Results (from the past 240 hour(s))  MRSA PCR SCREENING     Status: Abnormal   Collection Time    06/12/14 10:53 AM      Result Value Ref Range Status   MRSA by PCR POSITIVE (*) NEGATIVE Final   Comment:            The GeneXpert MRSA Assay (FDA     approved for NASAL specimens     only), is one component of a     comprehensive MRSA colonization     surveillance program. It is not     intended to diagnose MRSA     infection nor to guide or     monitor treatment for     MRSA infections.     CRITICAL RESULT CALLED TO, READ BACK BY AND VERIFIED WITH:     FOOT L ON 06/12/14 AT 1240 BY HAMLETT P   Studies/Results: Dg Chest Portable 1 View  06/11/2014   CLINICAL DATA:  Chest pain, shortness of breath.  EXAM: PORTABLE CHEST - 1 VIEW  COMPARISON:  11/08/2013  FINDINGS: Cardiomegaly with vascular congestion and diffuse interstitial prominence throughout the lungs concerning for edema/CHF. Possible small bilateral effusions. No acute bony abnormality.  IMPRESSION: Findings compatible with mild to moderate CHF.   Electronically Signed   By: Rolm Baptise M.D.   On: 06/11/2014 20:04    Medications: I have reviewed the patient's current medications.  Assesment: Active Problems:   Chronic kidney disease   Seizure disorder   Diabetes   CHF (congestive heart failure)   Chest  pain COPD Panhypopituitarism seizure disorder Morbid obesity   Plan: Medications reviewed Continue Iv diuresis BMP daily Cardiology and nephrology consult appreciated    LOS: 2 days   Genova Kiner 06/13/2014,  8:15 AM

## 2014-06-13 NOTE — Progress Notes (Signed)
Subjective: Interval History: has no complaint of nausea or vomiting. Patient presently denies any difficulty in breathing. Overall he said he'[name] feeling better..  Objective: Vital signs in last 24 hours: Temp:  [97.4 F (36.3 C)-98.4 F (36.9 C)] 97.4 F (36.3 C) (07/07 0433) Pulse Rate:  [85-89] 89 (07/07 0433) Resp:  [20] 20 (07/07 0433) BP: (123-127)/(47-54) 127/51 mmHg (07/07 0433) SpO2:  [93 %-100 %] 93 % (07/07 0737) Weight change:   Intake/Output from previous day: 07/06 0701 - 07/07 0700 In: 960 [P.O.:960] Out: 2400 [Urine:2400] Intake/Output this shift:    General appearance: alert, cooperative and no distress Resp: diminished breath sounds posterior - bilateral Cardio: regular rate and rhythm, S1, S2 normal, no murmur, click, rub or gallop GI: soft, non-tender; bowel sounds normal; no masses,  no organomegaly Extremities: extremities normal, atraumatic, no cyanosis or edema  Lab Results:  Recent Labs  06/11/14 1950 06/12/14 0631  WBC 6.7 7.7  HGB 11.7* 11.5*  HCT 36.3* 36.2*  PLT 149* 145*   BMET:  Recent Labs  06/12/14 0631 06/13/14 0637  NA 141 140  K 3.7 4.5  CL 100 98  CO2 26 30  GLUCOSE 100* 94  BUN 44* 51*  CREATININE 2.54* 2.53*  CALCIUM 8.9 9.1   No results found for this basename: PTH,  in the last 72 hours Iron Studies: No results found for this basename: IRON, TIBC, TRANSFERRIN, FERRITIN,  in the last 72 hours  Studies/Results: Dg Chest Portable 1 View  06/11/2014   CLINICAL DATA:  Chest pain, shortness of breath.  EXAM: PORTABLE CHEST - 1 VIEW  COMPARISON:  11/08/2013  FINDINGS: Cardiomegaly with vascular congestion and diffuse interstitial prominence throughout the lungs concerning for edema/CHF. Possible small bilateral effusions. No acute bony abnormality.  IMPRESSION: Findings compatible with mild to moderate CHF.   Electronically Signed   By: Rolm Baptise M.D.   On: 06/11/2014 20:04    I have reviewed the patient'[name] current  medications.  Assessment/Plan: Problem #1 chronic renal failure: His BUN and creatinine is stable he'll have any sign of uremia. Problem #2 CHF: Patient on Lasix was good urine output. Presently seems to be improving. Ex line problem #3 hypertension: His blood pressure is reasonably controlled Problem #4 hypothyroidism: [name] on Synthroid Problem #5 history of diabetes Problem #6 anemia: His hemoglobin and hematocrit is stable. 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  LOS: 2 days   [name] [name] 06/13/2014,7:47 AM

## 2014-06-13 NOTE — Care Management Note (Unsigned)
    Page 1 of 1   06/13/2014     2:23:50 PM CARE MANAGEMENT NOTE 06/13/2014  Patient:  [name] [name]   Account Number:  [id number]  Date Initiated:  06/13/2014  Documentation initiated by:  [name] [name]  Subjective/Objective Assessment:   Admitted with CHF. He lives at home, alone, with family support. He has O2 and uses PRN, somestimes all day, somestimes not. He has a walker. He asked about a higer toilet seat, but when told Medicare does not cover this, he does not it.     Action/Plan:   Offered BSC to place over toilet, but he does not think he wants this, either. HH discussed, but he is not sure he wants a nurse to visit. He will think about it. Will follow   Anticipated DC Date:  06/14/2014   Anticipated DC Plan:  [place]  CM consult      Choice offered to / List presented to:             Status of service:  In process, will continue to follow Medicare Important Message given?   (If response is "NO", the following Medicare IM given date fields will be blank) Date Medicare IM given:   Medicare IM given by:   Date Additional Medicare IM given:   Additional Medicare IM given by:    Discharge Disposition:    Per UR Regulation:  Reviewed for med. necessity/level of care/duration of stay  If discussed at [place] of Stay Meetings, dates discussed:    Comments:  06/13/14 [place] RN/CM

## 2014-06-14 LAB — BASIC METABOLIC PANEL
Anion gap: 13 (ref 5–15)
BUN: 54 mg/dL — ABNORMAL HIGH (ref 6–23)
CHLORIDE: 97 meq/L (ref 96–112)
CO2: 28 mEq/L (ref 19–32)
Calcium: 8.5 mg/dL (ref 8.4–10.5)
Creatinine, Ser: 2.35 mg/dL — ABNORMAL HIGH (ref 0.50–1.35)
GFR calc non Af Amer: 27 mL/min — ABNORMAL LOW (ref >90)
GFR, EST AFRICAN AMERICAN: 31 mL/min — AB (ref >90)
Glucose, Bld: 106 mg/dL — ABNORMAL HIGH (ref 70–99)
POTASSIUM: 4.4 meq/L (ref 3.7–5.3)
SODIUM: 138 meq/L (ref 137–147)

## 2014-06-14 LAB — GLUCOSE, CAPILLARY: Glucose-Capillary: 105 mg/dL — ABNORMAL HIGH (ref 70–99)

## 2014-06-14 MED ORDER — DESMOPRESSIN ACETATE 0.2 MG PO TABS: 0.2000 mg | ORAL_TABLET | Freq: Two times a day (BID) | ORAL | Status: DC

## 2014-06-14 NOTE — Discharge Summary (Signed)
Physician Discharge Summary  Patient ID: [name] [name] MRN: [id number] DOB/AGE: 1946-07-04 68 y.o. Primary Care Physician:Mayes Sangiovanni, MD Admit date: 06/11/2014 Discharge date: 06/14/2014    Discharge Diagnoses:   Active Problems:   Chronic kidney disease   Seizure disorder   Diabetes   CHF (congestive heart failure)   Chest pain Acute on chronic diastolic CHF Aortic regurgitation (severe)    Medication List         albuterol (2.5 MG/3ML) 0.083% nebulizer solution  Commonly known as:  PROVENTIL  Take 3 mLs (2.5 mg total) by nebulization every 6 (six) hours as needed for wheezing.     amLODipine 10 MG tablet  Commonly known as:  NORVASC  Take 10 mg by mouth daily.     budesonide-formoterol 160-4.5 MCG/ACT inhaler  Commonly known as:  SYMBICORT  Inhale 2 puffs into the lungs 2 (two) times daily.     cloNIDine 0.1 MG tablet  Commonly known as:  CATAPRES  Take 0.1 mg by mouth 3 (three) times daily.     desmopressin 0.2 MG tablet  Commonly known as:  DDAVP  Take 1 tablet (0.2 mg total) by mouth 2 (two) times daily.     dexamethasone 0.5 MG tablet  Commonly known as:  DECADRON  Take 0.5 mg by mouth daily.     ipratropium 0.02 % nebulizer solution  Commonly known as:  ATROVENT  Take 0.5 mg by nebulization 4 (four) times daily.     levothyroxine 175 MCG tablet  Commonly known as:  SYNTHROID, LEVOTHROID  Take 175 mcg by mouth daily before breakfast.     linagliptin 5 MG Tabs tablet  Commonly known as:  TRADJENTA  Take 5 mg by mouth daily.     loratadine 10 MG tablet  Commonly known as:  CLARITIN  Take 10 mg by mouth daily.     metolazone 5 MG tablet  Commonly known as:  ZAROXOLYN  Take one tablet a week as needed for weight gain.     mometasone 50 MCG/ACT nasal spray  Commonly known as:  NASONEX  Place 2 sprays into the nose daily.     olmesartan 20 MG tablet  Commonly known as:  BENICAR  Take 10 mg by mouth daily.     omeprazole 20 MG capsule   Commonly known as:  PRILOSEC  Take 20 mg by mouth daily.     oxymetazoline 0.05 % nasal spray  Commonly known as:  AFRIN NASAL SPRAY  Place 1 spray into both nostrils 2 (two) times daily.     phenytoin 100 MG ER capsule  Commonly known as:  DILANTIN  Take 200-300 mg by mouth daily. Takes 200 mg on Monday and Thursdsay. On all other days of the week takes 300 mg.     simvastatin 80 MG tablet  Commonly known as:  ZOCOR  Take 80 mg by mouth daily.     torsemide 100 MG tablet  Commonly known as:  DEMADEX  Take 0.5 tablets (50 mg total) by mouth 2 (two) times daily.        Discharged Condition:  IMPROVED    Consults: Cardiology and nephrologyY  Significant Diagnostic Studies: Dg Chest Portable 1 View  06/11/2014   CLINICAL DATA:  Chest pain, shortness of breath.  EXAM: PORTABLE CHEST - 1 VIEW  COMPARISON:  11/08/2013  FINDINGS: Cardiomegaly with vascular congestion and diffuse interstitial prominence throughout the lungs concerning for edema/CHF. Possible small bilateral effusions. No acute bony abnormality.  IMPRESSION: Findings compatible  with mild to moderate CHF.   Electronically Signed   By: Rolm Baptise M.D.   On: 06/11/2014 20:04    Lab Results: Basic Metabolic Panel:  Recent Labs  06/13/14 0637 06/14/14 0524  NA 140 138  K 4.5 4.4  CL 98 97  CO2 30 28  GLUCOSE 94 106*  BUN 51* 54*  CREATININE 2.53* 2.35*  CALCIUM 9.1 8.5  PHOS 4.0  --    Liver Function Tests:  Recent Labs  06/11/14 1950 06/12/14 0631  AST 19 17  ALT 17 16  ALKPHOS 54 55  BILITOT 0.4 0.7  PROT 7.4 7.7  ALBUMIN 3.6 3.8     CBC:  Recent Labs  06/11/14 1950 06/12/14 0631  WBC 6.7 7.7  NEUTROABS 4.2  --   HGB 11.7* 11.5*  HCT 36.3* 36.2*  MCV 90.1 90.3  PLT 149* 145*    Recent Results (from the past 240 hour(s))  MRSA PCR SCREENING     Status: Abnormal   Collection Time    06/12/14 10:53 AM      Result Value Ref Range Status   MRSA by PCR POSITIVE (*) NEGATIVE Final    Comment:            The GeneXpert MRSA Assay (FDA     approved for NASAL specimens     only), is one component of a     comprehensive MRSA colonization     surveillance program. It is not     intended to diagnose MRSA     infection nor to guide or     monitor treatment for     MRSA infections.     CRITICAL RESULT CALLED TO, READ BACK BY AND VERIFIED WITH:     FOOT L ON 06/12/14 AT [place] [facility] Course:  This is a 68 years old male patient with history of multiple medical illnesses who was admitted due to shortness of breath. Patient was treated with Iv diuretics for acute on chronic diastolic CHF. Patient improved and discharge in stable condition to be followed in out patient.  Discharge Exam: Blood pressure 132/71, pulse 84, temperature 98.1 F (36.7 C), temperature source Oral, resp. rate 18, height 6\' 1"  (1.854 m), weight 121.201 kg (267 lb 3.2 oz), SpO2 99.00%.   Disposition:  Home.      Signed: Krislynn Gronau   06/14/2014, 8:27 AM

## 2014-06-14 NOTE — Progress Notes (Signed)
Patient states understanding of discharge instructions.  

## 2014-06-15 ENCOUNTER — Other Ambulatory Visit: Payer: Self-pay | Admitting: Cardiology

## 2014-08-08 ENCOUNTER — Emergency Department (HOSPITAL_COMMUNITY)
Admission: EM | Admit: 2014-08-08 | Discharge: 2014-08-08 | Disposition: A | Discharge status: Home / Self Care | Payer: Medicare HMO | Attending: Emergency Medicine | Admitting: Emergency Medicine

## 2014-08-08 ENCOUNTER — Encounter (HOSPITAL_COMMUNITY): Payer: Self-pay | Admitting: Emergency Medicine

## 2014-08-08 ENCOUNTER — Emergency Department (HOSPITAL_COMMUNITY): Payer: Medicare HMO

## 2014-08-08 DIAGNOSIS — Z6841 Body Mass Index (BMI) 40.0 and over, adult: Secondary | ICD-10-CM | POA: Diagnosis not present

## 2014-08-08 DIAGNOSIS — K921 Melena: Secondary | ICD-10-CM | POA: Insufficient documentation

## 2014-08-08 DIAGNOSIS — G40909 Epilepsy, unspecified, not intractable, without status epilepticus: Secondary | ICD-10-CM | POA: Diagnosis not present

## 2014-08-08 DIAGNOSIS — J449 Chronic obstructive pulmonary disease, unspecified: Secondary | ICD-10-CM | POA: Diagnosis not present

## 2014-08-08 DIAGNOSIS — Z87891 Personal history of nicotine dependence: Secondary | ICD-10-CM | POA: Insufficient documentation

## 2014-08-08 DIAGNOSIS — R011 Cardiac murmur, unspecified: Secondary | ICD-10-CM | POA: Insufficient documentation

## 2014-08-08 DIAGNOSIS — I509 Heart failure, unspecified: Secondary | ICD-10-CM | POA: Diagnosis not present

## 2014-08-08 DIAGNOSIS — Z872 Personal history of diseases of the skin and subcutaneous tissue: Secondary | ICD-10-CM | POA: Diagnosis not present

## 2014-08-08 DIAGNOSIS — N189 Chronic kidney disease, unspecified: Secondary | ICD-10-CM | POA: Insufficient documentation

## 2014-08-08 DIAGNOSIS — Z9981 Dependence on supplemental oxygen: Secondary | ICD-10-CM | POA: Insufficient documentation

## 2014-08-08 DIAGNOSIS — IMO0002 Reserved for concepts with insufficient information to code with codable children: Secondary | ICD-10-CM | POA: Diagnosis not present

## 2014-08-08 DIAGNOSIS — E119 Type 2 diabetes mellitus without complications: Secondary | ICD-10-CM | POA: Insufficient documentation

## 2014-08-08 DIAGNOSIS — Z905 Acquired absence of kidney: Secondary | ICD-10-CM | POA: Diagnosis not present

## 2014-08-08 DIAGNOSIS — K625 Hemorrhage of anus and rectum: Secondary | ICD-10-CM

## 2014-08-08 DIAGNOSIS — Z79899 Other long term (current) drug therapy: Secondary | ICD-10-CM | POA: Diagnosis not present

## 2014-08-08 DIAGNOSIS — E039 Hypothyroidism, unspecified: Secondary | ICD-10-CM | POA: Insufficient documentation

## 2014-08-08 DIAGNOSIS — E785 Hyperlipidemia, unspecified: Secondary | ICD-10-CM | POA: Insufficient documentation

## 2014-08-08 DIAGNOSIS — J4489 Other specified chronic obstructive pulmonary disease: Secondary | ICD-10-CM | POA: Insufficient documentation

## 2014-08-08 LAB — CBC WITH DIFFERENTIAL/PLATELET
BASOS PCT: 1 % (ref 0–1)
Basophils Absolute: 0.1 10*3/uL (ref 0.0–0.1)
Eosinophils Absolute: 0.8 10*3/uL — ABNORMAL HIGH (ref 0.0–0.7)
Eosinophils Relative: 11 % — ABNORMAL HIGH (ref 0–5)
HEMATOCRIT: 38.2 % — AB (ref 39.0–52.0)
Hemoglobin: 12.1 g/dL — ABNORMAL LOW (ref 13.0–17.0)
Lymphocytes Relative: 17 % (ref 12–46)
Lymphs Abs: 1.2 10*3/uL (ref 0.7–4.0)
MCH: 28 pg (ref 26.0–34.0)
MCHC: 31.7 g/dL (ref 30.0–36.0)
MCV: 88.4 fL (ref 78.0–100.0)
MONO ABS: 0.9 10*3/uL (ref 0.1–1.0)
Monocytes Relative: 13 % — ABNORMAL HIGH (ref 3–12)
NEUTROS ABS: 4.1 10*3/uL (ref 1.7–7.7)
Neutrophils Relative %: 58 % (ref 43–77)
Platelets: 182 10*3/uL (ref 150–400)
RBC: 4.32 MIL/uL (ref 4.22–5.81)
RDW: 16.2 % — ABNORMAL HIGH (ref 11.5–15.5)
WBC: 7 10*3/uL (ref 4.0–10.5)

## 2014-08-08 LAB — COMPREHENSIVE METABOLIC PANEL
ALBUMIN: 3.8 g/dL (ref 3.5–5.2)
ALT: 13 U/L (ref 0–53)
AST: 20 U/L (ref 0–37)
Alkaline Phosphatase: 65 U/L (ref 39–117)
Anion gap: 12 (ref 5–15)
BILIRUBIN TOTAL: 0.2 mg/dL — AB (ref 0.3–1.2)
BUN: 57 mg/dL — ABNORMAL HIGH (ref 6–23)
CHLORIDE: 99 meq/L (ref 96–112)
CO2: 29 meq/L (ref 19–32)
CREATININE: 2.4 mg/dL — AB (ref 0.50–1.35)
Calcium: 9.4 mg/dL (ref 8.4–10.5)
GFR calc Af Amer: 30 mL/min — ABNORMAL LOW (ref >90)
GFR, EST NON AFRICAN AMERICAN: 26 mL/min — AB (ref >90)
Glucose, Bld: 93 mg/dL (ref 70–99)
Potassium: 3.6 mEq/L — ABNORMAL LOW (ref 3.7–5.3)
Sodium: 140 mEq/L (ref 137–147)
Total Protein: 7.9 g/dL (ref 6.0–8.3)

## 2014-08-08 LAB — PHENYTOIN LEVEL, TOTAL: PHENYTOIN LVL: 4.6 ug/mL — AB (ref 10.0–20.0)

## 2014-08-08 MED ORDER — SODIUM CHLORIDE 0.9 % IV BOLUS (SEPSIS)
500.0000 mL | Freq: Once | INTRAVENOUS | Status: AC
  Administered 2014-08-08: 500 mL via INTRAVENOUS

## 2014-08-08 MED ORDER — PANTOPRAZOLE SODIUM 40 MG IV SOLR
40.0000 mg | Freq: Once | INTRAVENOUS | Status: AC
  Administered 2014-08-08: 40 mg via INTRAVENOUS
  Filled 2014-08-08: qty 40

## 2014-08-08 NOTE — Discharge Instructions (Signed)
Follow up with your md this week.  Return if any problems °
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
## 2014-08-08 NOTE — ED Notes (Signed)
Having black stools for last couple days, daily.  Denies any any pain or discomfort.

## 2014-08-08 NOTE — ED Notes (Signed)
Donnal Debar daughter [phone] cell.

## 2014-08-08 NOTE — ED Notes (Signed)
Having rectal bleeding for couple days.

## 2014-08-17 ENCOUNTER — Telehealth: Payer: Self-pay | Admitting: *Deleted

## 2014-08-17 NOTE — Telephone Encounter (Signed)
Pt needs metolazone (ZAROXOLYN) 5 MG tablet called in to [facility]

## 2014-08-17 NOTE — Telephone Encounter (Signed)
Too soon for refills medication is to be taken weekly pt had 3 month supply with 1 refill ordered in June

## 2014-09-21 ENCOUNTER — Other Ambulatory Visit: Payer: Self-pay | Admitting: Cardiovascular Disease

## 2014-11-06 ENCOUNTER — Other Ambulatory Visit: Payer: Self-pay | Admitting: Cardiovascular Disease

## 2014-11-16 ENCOUNTER — Ambulatory Visit (INDEPENDENT_AMBULATORY_CARE_PROVIDER_SITE_OTHER): Payer: Medicare HMO | Admitting: Cardiovascular Disease

## 2014-11-16 ENCOUNTER — Ambulatory Visit (HOSPITAL_COMMUNITY)
Admission: RE | Admit: 2014-11-16 | Discharge: 2014-11-16 | Disposition: A | Discharge status: Home / Self Care | Payer: Medicare HMO | Source: Ambulatory Visit | Attending: Cardiovascular Disease | Admitting: Cardiovascular Disease

## 2014-11-16 ENCOUNTER — Encounter: Payer: Self-pay | Admitting: Cardiovascular Disease

## 2014-11-16 VITALS — BP 146/56 | HR 98 | Ht 73.0 in | Wt 280.0 lb

## 2014-11-16 DIAGNOSIS — Z6841 Body Mass Index (BMI) 40.0 and over, adult: Secondary | ICD-10-CM

## 2014-11-16 DIAGNOSIS — I10 Essential (primary) hypertension: Secondary | ICD-10-CM

## 2014-11-16 DIAGNOSIS — I359 Nonrheumatic aortic valve disorder, unspecified: Secondary | ICD-10-CM

## 2014-11-16 DIAGNOSIS — E785 Hyperlipidemia, unspecified: Secondary | ICD-10-CM

## 2014-11-16 DIAGNOSIS — I358 Other nonrheumatic aortic valve disorders: Secondary | ICD-10-CM | POA: Insufficient documentation

## 2014-11-16 DIAGNOSIS — J438 Other emphysema: Secondary | ICD-10-CM

## 2014-11-16 DIAGNOSIS — I351 Nonrheumatic aortic (valve) insufficiency: Secondary | ICD-10-CM

## 2014-11-16 DIAGNOSIS — I5032 Chronic diastolic (congestive) heart failure: Secondary | ICD-10-CM

## 2014-11-16 MED ORDER — OLMESARTAN MEDOXOMIL 20 MG PO TABS: 20.0000 mg | ORAL_TABLET | Freq: Every day | ORAL | Status: DC

## 2014-11-16 MED ORDER — CLONIDINE HCL 0.2 MG PO TABS: 0.2000 mg | ORAL_TABLET | Freq: Three times a day (TID) | ORAL | Status: DC

## 2014-11-16 NOTE — Patient Instructions (Addendum)
Your physician recommends that you schedule a follow-up appointment in: 1 month with Dr. Bronson Ing.  Your physician has requested that you have an echocardiogram. Echocardiography is a painless test that uses sound waves to create images of your heart. It provides your doctor with information about the size and shape of your heart and how well your heart's chambers and valves are working. This procedure takes approximately one hour. There are no restrictions for this procedure.   Your physician has recommended you make the following change in your medication:   INCREASE : Clonidine to 0.2mg  three times daily STOP TAKING: Benicar  Thank you for choosing [facility]!

## 2014-11-16 NOTE — Addendum Note (Signed)
Addended by: Levonne Hubert on: 11/16/2014 04:31 PM   Modules accepted: Orders, Medications
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
## 2014-11-16 NOTE — Progress Notes (Signed)
  Echocardiogram 2D Echocardiogram has been performed.  [place], [place] 11/16/2014, 5:28 PM

## 2014-11-17 ENCOUNTER — Telehealth: Payer: Self-pay | Admitting: *Deleted

## 2014-11-17 DIAGNOSIS — Z01818 Encounter for other preprocedural examination: Secondary | ICD-10-CM

## 2014-11-17 NOTE — Telephone Encounter (Signed)
Cath scheduled Tuesday 29th 9am with Dr. Claiborne Billings. LM for pt. Pt has not confirmed. Labs ordered. Will forward to Dr. Bronson Ing

## 2014-11-17 NOTE — Telephone Encounter (Signed)
-----   Message from Herminio Commons, MD sent at 11/17/2014  4:34 PM EST ----- I suspect his decline in LV function is related to aortic regurgitation. Please arrange for coronary angiography (left and right heart cath, cors) to be done within next 1-2 weeks.

## 2014-11-20 ENCOUNTER — Other Ambulatory Visit: Payer: Self-pay | Admitting: Cardiovascular Disease

## 2014-11-20 ENCOUNTER — Telehealth: Payer: Self-pay

## 2014-11-20 DIAGNOSIS — I519 Heart disease, unspecified: Secondary | ICD-10-CM

## 2014-11-20 DIAGNOSIS — I5189 Other ill-defined heart diseases: Secondary | ICD-10-CM

## 2014-11-20 DIAGNOSIS — I351 Nonrheumatic aortic (valve) insufficiency: Secondary | ICD-10-CM

## 2014-11-20 NOTE — Telephone Encounter (Signed)
Spoke with patient and explained doctors recommendations, he will have lab work done tomorrow. I also spoke with his sister [name] and she will take patient for cath on 12/05/14 at 9am.I went over instructions with her and have mailed cath instructions letter to patient

## 2014-11-20 NOTE — Telephone Encounter (Signed)
-----   Message from Herminio Commons, MD sent at 11/17/2014  4:34 PM EST ----- I suspect his decline in LV function is related to aortic regurgitation. Please arrange for coronary angiography (left and right heart cath, cors) to be done within next 1-2 weeks.

## 2014-11-21 LAB — BASIC METABOLIC PANEL WITH GFR
BUN: 45 mg/dL — AB (ref 6–23)
CALCIUM: 9.3 mg/dL (ref 8.4–10.5)
CO2: 31 meq/L (ref 19–32)
Chloride: 99 mEq/L (ref 96–112)
Creat: 2.58 mg/dL — ABNORMAL HIGH (ref 0.50–1.35)
GFR, Est African American: 28 mL/min — ABNORMAL LOW
GFR, Est Non African American: 24 mL/min — ABNORMAL LOW
Glucose, Bld: 86 mg/dL (ref 70–99)
Potassium: 4.6 mEq/L (ref 3.5–5.3)
SODIUM: 140 meq/L (ref 135–145)

## 2014-11-21 LAB — CBC WITH DIFFERENTIAL/PLATELET
Basophils Absolute: 0.1 10*3/uL (ref 0.0–0.1)
Basophils Relative: 1 % (ref 0–1)
Eosinophils Absolute: 0.5 10*3/uL (ref 0.0–0.7)
Eosinophils Relative: 7 % — ABNORMAL HIGH (ref 0–5)
HEMATOCRIT: 39.2 % (ref 39.0–52.0)
Hemoglobin: 12.4 g/dL — ABNORMAL LOW (ref 13.0–17.0)
LYMPHS ABS: 1.3 10*3/uL (ref 0.7–4.0)
Lymphocytes Relative: 17 % (ref 12–46)
MCH: 27 pg (ref 26.0–34.0)
MCHC: 31.6 g/dL (ref 30.0–36.0)
MCV: 85.2 fL (ref 78.0–100.0)
MONOS PCT: 16 % — AB (ref 3–12)
MPV: 10.5 fL (ref 9.4–12.4)
Monocytes Absolute: 1.2 10*3/uL — ABNORMAL HIGH (ref 0.1–1.0)
NEUTROS PCT: 59 % (ref 43–77)
Neutro Abs: 4.5 10*3/uL (ref 1.7–7.7)
Platelets: 201 10*3/uL (ref 150–400)
RBC: 4.6 MIL/uL (ref 4.22–5.81)
RDW: 17.8 % — ABNORMAL HIGH (ref 11.5–15.5)
WBC: 7.6 10*3/uL (ref 4.0–10.5)

## 2014-11-22 ENCOUNTER — Telehealth: Payer: Self-pay | Admitting: *Deleted

## 2014-11-22 LAB — PROTIME-INR
INR: 1.36 (ref <1.50)
Prothrombin Time: 16.8 seconds — ABNORMAL HIGH (ref 11.6–15.2)

## 2014-11-22 NOTE — Telephone Encounter (Signed)
PT was under the impression that he was taken off benicar...... He states another Rx was called in for him to pick up at the pharmacy for it.

## 2014-11-22 NOTE — Telephone Encounter (Signed)
Spoke with pt and we did not refill Benicar as is d/c'd LOV. Told pt we will call [facility] to clarify.

## 2014-11-23 ENCOUNTER — Ambulatory Visit (INDEPENDENT_AMBULATORY_CARE_PROVIDER_SITE_OTHER): Payer: Medicare HMO | Admitting: Otolaryngology

## 2014-12-04 ENCOUNTER — Inpatient Hospital Stay (HOSPITAL_COMMUNITY)
Admission: EM | Admit: 2014-12-04 | Discharge: 2014-12-19 | DRG: 871 | Disposition: A | Discharge status: Skilled Nursing Facility | Payer: Medicare HMO | Attending: Internal Medicine | Admitting: Internal Medicine

## 2014-12-04 ENCOUNTER — Other Ambulatory Visit: Payer: Self-pay

## 2014-12-04 ENCOUNTER — Emergency Department (HOSPITAL_COMMUNITY): Payer: Medicare HMO

## 2014-12-04 ENCOUNTER — Encounter (HOSPITAL_COMMUNITY): Payer: Self-pay | Admitting: Emergency Medicine

## 2014-12-04 DIAGNOSIS — N184 Chronic kidney disease, stage 4 (severe): Secondary | ICD-10-CM | POA: Diagnosis not present

## 2014-12-04 DIAGNOSIS — Z79899 Other long term (current) drug therapy: Secondary | ICD-10-CM

## 2014-12-04 DIAGNOSIS — Z905 Acquired absence of kidney: Secondary | ICD-10-CM | POA: Diagnosis present

## 2014-12-04 DIAGNOSIS — N179 Acute kidney failure, unspecified: Secondary | ICD-10-CM | POA: Diagnosis present

## 2014-12-04 DIAGNOSIS — G40909 Epilepsy, unspecified, not intractable, without status epilepticus: Secondary | ICD-10-CM

## 2014-12-04 DIAGNOSIS — J9611 Chronic respiratory failure with hypoxia: Secondary | ICD-10-CM | POA: Diagnosis not present

## 2014-12-04 DIAGNOSIS — I5043 Acute on chronic combined systolic (congestive) and diastolic (congestive) heart failure: Secondary | ICD-10-CM

## 2014-12-04 DIAGNOSIS — Z7951 Long term (current) use of inhaled steroids: Secondary | ICD-10-CM

## 2014-12-04 DIAGNOSIS — Z952 Presence of prosthetic heart valve: Secondary | ICD-10-CM

## 2014-12-04 DIAGNOSIS — Z87891 Personal history of nicotine dependence: Secondary | ICD-10-CM | POA: Diagnosis not present

## 2014-12-04 DIAGNOSIS — A419 Sepsis, unspecified organism: Principal | ICD-10-CM | POA: Diagnosis present

## 2014-12-04 DIAGNOSIS — D638 Anemia in other chronic diseases classified elsewhere: Secondary | ICD-10-CM | POA: Diagnosis not present

## 2014-12-04 DIAGNOSIS — E785 Hyperlipidemia, unspecified: Secondary | ICD-10-CM | POA: Diagnosis not present

## 2014-12-04 DIAGNOSIS — Z9981 Dependence on supplemental oxygen: Secondary | ICD-10-CM

## 2014-12-04 DIAGNOSIS — Z7952 Long term (current) use of systemic steroids: Secondary | ICD-10-CM

## 2014-12-04 DIAGNOSIS — Z6837 Body mass index (BMI) 37.0-37.9, adult: Secondary | ICD-10-CM

## 2014-12-04 DIAGNOSIS — E669 Obesity, unspecified: Secondary | ICD-10-CM | POA: Diagnosis not present

## 2014-12-04 DIAGNOSIS — K858 Other acute pancreatitis: Secondary | ICD-10-CM

## 2014-12-04 DIAGNOSIS — E0829 Diabetes mellitus due to underlying condition with other diabetic kidney complication: Secondary | ICD-10-CM

## 2014-12-04 DIAGNOSIS — R200 Anesthesia of skin: Secondary | ICD-10-CM

## 2014-12-04 DIAGNOSIS — R109 Unspecified abdominal pain: Secondary | ICD-10-CM

## 2014-12-04 DIAGNOSIS — R0600 Dyspnea, unspecified: Secondary | ICD-10-CM

## 2014-12-04 DIAGNOSIS — Z9119 Patient's noncompliance with other medical treatment and regimen: Secondary | ICD-10-CM | POA: Diagnosis not present

## 2014-12-04 DIAGNOSIS — I10 Essential (primary) hypertension: Secondary | ICD-10-CM | POA: Diagnosis present

## 2014-12-04 DIAGNOSIS — R112 Nausea with vomiting, unspecified: Secondary | ICD-10-CM

## 2014-12-04 DIAGNOSIS — F17201 Nicotine dependence, unspecified, in remission: Secondary | ICD-10-CM

## 2014-12-04 DIAGNOSIS — I5042 Chronic combined systolic (congestive) and diastolic (congestive) heart failure: Secondary | ICD-10-CM | POA: Diagnosis present

## 2014-12-04 DIAGNOSIS — E119 Type 2 diabetes mellitus without complications: Secondary | ICD-10-CM | POA: Diagnosis present

## 2014-12-04 DIAGNOSIS — E230 Hypopituitarism: Secondary | ICD-10-CM | POA: Diagnosis not present

## 2014-12-04 DIAGNOSIS — Z85528 Personal history of other malignant neoplasm of kidney: Secondary | ICD-10-CM

## 2014-12-04 DIAGNOSIS — K819 Cholecystitis, unspecified: Secondary | ICD-10-CM | POA: Diagnosis not present

## 2014-12-04 DIAGNOSIS — R0602 Shortness of breath: Secondary | ICD-10-CM | POA: Diagnosis present

## 2014-12-04 DIAGNOSIS — I272 Other secondary pulmonary hypertension: Secondary | ICD-10-CM | POA: Diagnosis present

## 2014-12-04 DIAGNOSIS — G4730 Sleep apnea, unspecified: Secondary | ICD-10-CM | POA: Diagnosis present

## 2014-12-04 DIAGNOSIS — K859 Acute pancreatitis without necrosis or infection, unspecified: Secondary | ICD-10-CM | POA: Insufficient documentation

## 2014-12-04 DIAGNOSIS — I351 Nonrheumatic aortic (valve) insufficiency: Secondary | ICD-10-CM | POA: Diagnosis not present

## 2014-12-04 DIAGNOSIS — J449 Chronic obstructive pulmonary disease, unspecified: Secondary | ICD-10-CM | POA: Diagnosis present

## 2014-12-04 DIAGNOSIS — G4733 Obstructive sleep apnea (adult) (pediatric): Secondary | ICD-10-CM | POA: Diagnosis not present

## 2014-12-04 DIAGNOSIS — E039 Hypothyroidism, unspecified: Secondary | ICD-10-CM | POA: Diagnosis not present

## 2014-12-04 DIAGNOSIS — I472 Ventricular tachycardia: Secondary | ICD-10-CM | POA: Diagnosis not present

## 2014-12-04 DIAGNOSIS — E038 Other specified hypothyroidism: Secondary | ICD-10-CM

## 2014-12-04 DIAGNOSIS — R079 Chest pain, unspecified: Secondary | ICD-10-CM

## 2014-12-04 DIAGNOSIS — I129 Hypertensive chronic kidney disease with stage 1 through stage 4 chronic kidney disease, or unspecified chronic kidney disease: Secondary | ICD-10-CM | POA: Diagnosis present

## 2014-12-04 DIAGNOSIS — R509 Fever, unspecified: Secondary | ICD-10-CM | POA: Diagnosis present

## 2014-12-04 LAB — CBC
HCT: 37.9 % — ABNORMAL LOW (ref 39.0–52.0)
HEMATOCRIT: 39.4 % (ref 39.0–52.0)
HEMOGLOBIN: 12 g/dL — AB (ref 13.0–17.0)
Hemoglobin: 11.7 g/dL — ABNORMAL LOW (ref 13.0–17.0)
MCH: 27.3 pg (ref 26.0–34.0)
MCH: 27 pg (ref 26.0–34.0)
MCHC: 30.5 g/dL (ref 30.0–36.0)
MCHC: 30.9 g/dL (ref 30.0–36.0)
MCV: 89.5 fL (ref 78.0–100.0)
MCV: 87.5 fL (ref 78.0–100.0)
PLATELETS: 177 10*3/uL (ref 150–400)
Platelets: 158 10*3/uL (ref 150–400)
RBC: 4.4 MIL/uL (ref 4.22–5.81)
RBC: 4.33 MIL/uL (ref 4.22–5.81)
RDW: 17.2 % — ABNORMAL HIGH (ref 11.5–15.5)
RDW: 17.2 % — ABNORMAL HIGH (ref 11.5–15.5)
WBC: 7.2 10*3/uL (ref 4.0–10.5)
WBC: 6.3 10*3/uL (ref 4.0–10.5)

## 2014-12-04 LAB — DIFFERENTIAL
BASOS PCT: 1 % (ref 0–1)
Basophils Absolute: 0.1 10*3/uL (ref 0.0–0.1)
EOS ABS: 0.8 10*3/uL — AB (ref 0.0–0.7)
Eosinophils Relative: 11 % — ABNORMAL HIGH (ref 0–5)
Lymphocytes Relative: 17 % (ref 12–46)
Lymphs Abs: 1.2 10*3/uL (ref 0.7–4.0)
MONO ABS: 0.8 10*3/uL (ref 0.1–1.0)
MONOS PCT: 11 % (ref 3–12)
NEUTROS PCT: 61 % (ref 43–77)
Neutro Abs: 4.4 10*3/uL (ref 1.7–7.7)

## 2014-12-04 LAB — BASIC METABOLIC PANEL
Anion gap: 6 (ref 5–15)
BUN: 53 mg/dL — AB (ref 6–23)
CO2: 29 mmol/L (ref 19–32)
Calcium: 8.5 mg/dL (ref 8.4–10.5)
Chloride: 104 mEq/L (ref 96–112)
Creatinine, Ser: 2.37 mg/dL — ABNORMAL HIGH (ref 0.50–1.35)
GFR calc Af Amer: 30 mL/min — ABNORMAL LOW (ref >90)
GFR calc non Af Amer: 26 mL/min — ABNORMAL LOW (ref >90)
GLUCOSE: 108 mg/dL — AB (ref 70–99)
POTASSIUM: 4.7 mmol/L (ref 3.5–5.1)
Sodium: 139 mmol/L (ref 135–145)

## 2014-12-04 LAB — TROPONIN I
Troponin I: 0.04 ng/mL — ABNORMAL HIGH (ref <0.031)
Troponin I: 0.04 ng/mL — ABNORMAL HIGH (ref <0.031)
Troponin I: 0.04 ng/mL — ABNORMAL HIGH (ref <0.031)

## 2014-12-04 LAB — BRAIN NATRIURETIC PEPTIDE: B NATRIURETIC PEPTIDE 5: 801 pg/mL — AB (ref 0.0–100.0)

## 2014-12-04 LAB — URINALYSIS, ROUTINE W REFLEX MICROSCOPIC
Bilirubin Urine: NEGATIVE
Glucose, UA: NEGATIVE mg/dL
HGB URINE DIPSTICK: NEGATIVE
Ketones, ur: NEGATIVE mg/dL
Leukocytes, UA: NEGATIVE
Nitrite: NEGATIVE
Protein, ur: NEGATIVE mg/dL
Specific Gravity, Urine: 1.005 (ref 1.005–1.030)
Urobilinogen, UA: 0.2 mg/dL (ref 0.0–1.0)
pH: 6 (ref 5.0–8.0)

## 2014-12-04 LAB — HEPATIC FUNCTION PANEL
ALK PHOS: 68 U/L (ref 39–117)
ALT: 15 U/L (ref 0–53)
AST: 18 U/L (ref 0–37)
Albumin: 3.5 g/dL (ref 3.5–5.2)
Bilirubin, Direct: 0.1 mg/dL (ref 0.0–0.3)
Indirect Bilirubin: 0.4 mg/dL (ref 0.3–0.9)
TOTAL PROTEIN: 6.7 g/dL (ref 6.0–8.3)
Total Bilirubin: 0.5 mg/dL (ref 0.3–1.2)

## 2014-12-04 LAB — CREATININE, SERUM
Creatinine, Ser: 2.4 mg/dL — ABNORMAL HIGH (ref 0.50–1.35)
GFR calc non Af Amer: 26 mL/min — ABNORMAL LOW (ref >90)
GFR, EST AFRICAN AMERICAN: 30 mL/min — AB (ref >90)

## 2014-12-04 LAB — MRSA PCR SCREENING: MRSA by PCR: NEGATIVE

## 2014-12-04 LAB — GLUCOSE, CAPILLARY: Glucose-Capillary: 143 mg/dL — ABNORMAL HIGH (ref 70–99)

## 2014-12-04 LAB — LIPASE, BLOOD: Lipase: 61 U/L — ABNORMAL HIGH (ref 11–59)

## 2014-12-04 LAB — CBG MONITORING, ED: Glucose-Capillary: 85 mg/dL (ref 70–99)

## 2014-12-04 LAB — PHENYTOIN LEVEL, TOTAL: PHENYTOIN LVL: 8.8 ug/mL — AB (ref 10.0–20.0)

## 2014-12-04 MED ORDER — MORPHINE SULFATE 4 MG/ML IJ SOLN
4.0000 mg | Freq: Once | INTRAMUSCULAR | Status: AC
  Administered 2014-12-04: 4 mg via INTRAVENOUS
  Filled 2014-12-04: qty 1

## 2014-12-04 MED ORDER — DEXAMETHASONE 0.5 MG PO TABS
0.5000 mg | ORAL_TABLET | Freq: Every day | ORAL | Status: DC
  Administered 2014-12-04 – 2014-12-19 (×16): 0.5 mg via ORAL
  Filled 2014-12-04 (×18): qty 1

## 2014-12-04 MED ORDER — SODIUM CHLORIDE 0.9 % IJ SOLN
3.0000 mL | Freq: Two times a day (BID) | INTRAMUSCULAR | Status: DC
  Administered 2014-12-05 – 2014-12-16 (×14): 3 mL via INTRAVENOUS

## 2014-12-04 MED ORDER — PHENYTOIN SODIUM EXTENDED 100 MG PO CAPS
300.0000 mg | ORAL_CAPSULE | ORAL | Status: DC
  Administered 2014-12-05 – 2014-12-19 (×11): 300 mg via ORAL
  Filled 2014-12-04 (×10): qty 3

## 2014-12-04 MED ORDER — DESMOPRESSIN ACETATE 0.2 MG PO TABS
0.2000 mg | ORAL_TABLET | Freq: Two times a day (BID) | ORAL | Status: DC
  Administered 2014-12-04 – 2014-12-19 (×30): 0.2 mg via ORAL
  Filled 2014-12-04 (×35): qty 1

## 2014-12-04 MED ORDER — INSULIN ASPART 100 UNIT/ML ~~LOC~~ SOLN
0.0000 [IU] | Freq: Three times a day (TID) | SUBCUTANEOUS | Status: DC
  Administered 2014-12-06: 1 [IU] via SUBCUTANEOUS
  Administered 2014-12-07: 3 [IU] via SUBCUTANEOUS
  Administered 2014-12-09 – 2014-12-17 (×4): 1 [IU] via SUBCUTANEOUS

## 2014-12-04 MED ORDER — ONDANSETRON HCL 4 MG/2ML IJ SOLN
4.0000 mg | Freq: Once | INTRAMUSCULAR | Status: AC
  Administered 2014-12-04: 4 mg via INTRAVENOUS
  Filled 2014-12-04: qty 2

## 2014-12-04 MED ORDER — HEPARIN SODIUM (PORCINE) 5000 UNIT/ML IJ SOLN
5000.0000 [IU] | Freq: Three times a day (TID) | INTRAMUSCULAR | Status: DC
  Administered 2014-12-04 – 2014-12-19 (×43): 5000 [IU] via SUBCUTANEOUS
  Filled 2014-12-04 (×40): qty 1

## 2014-12-04 MED ORDER — ASPIRIN EC 325 MG PO TBEC
325.0000 mg | DELAYED_RELEASE_TABLET | Freq: Every day | ORAL | Status: DC
  Administered 2014-12-04 – 2014-12-19 (×16): 325 mg via ORAL
  Filled 2014-12-04 (×16): qty 1

## 2014-12-04 MED ORDER — SODIUM CHLORIDE 0.9 % IV SOLN
INTRAVENOUS | Status: DC
  Administered 2014-12-04: 14:00:00 via INTRAVENOUS

## 2014-12-04 MED ORDER — LEVOTHYROXINE SODIUM 75 MCG PO TABS
175.0000 ug | ORAL_TABLET | Freq: Every day | ORAL | Status: DC
  Administered 2014-12-05 – 2014-12-19 (×14): 175 ug via ORAL
  Filled 2014-12-04 (×29): qty 1

## 2014-12-04 MED ORDER — BUDESONIDE-FORMOTEROL FUMARATE 160-4.5 MCG/ACT IN AERO
2.0000 | INHALATION_SPRAY | Freq: Two times a day (BID) | RESPIRATORY_TRACT | Status: DC
  Administered 2014-12-04 – 2014-12-19 (×25): 2 via RESPIRATORY_TRACT
  Filled 2014-12-04 (×3): qty 6

## 2014-12-04 MED ORDER — PHENYTOIN SODIUM EXTENDED 100 MG PO CAPS
200.0000 mg | ORAL_CAPSULE | ORAL | Status: DC
  Administered 2014-12-04 – 2014-12-18 (×5): 200 mg via ORAL
  Filled 2014-12-04 (×6): qty 2

## 2014-12-04 MED ORDER — FUROSEMIDE 10 MG/ML IJ SOLN
80.0000 mg | Freq: Two times a day (BID) | INTRAMUSCULAR | Status: DC
  Administered 2014-12-04 – 2014-12-05 (×2): 80 mg via INTRAVENOUS
  Filled 2014-12-04 (×2): qty 8

## 2014-12-04 MED ORDER — CEFTRIAXONE SODIUM IN DEXTROSE 20 MG/ML IV SOLN
1.0000 g | INTRAVENOUS | Status: DC
  Administered 2014-12-04: 1 g via INTRAVENOUS
  Filled 2014-12-04 (×2): qty 50

## 2014-12-04 MED ORDER — MORPHINE SULFATE 4 MG/ML IJ SOLN
4.0000 mg | Freq: Once | INTRAMUSCULAR | Status: DC
  Filled 2014-12-04: qty 1

## 2014-12-04 MED ORDER — ONDANSETRON HCL 4 MG/2ML IJ SOLN
4.0000 mg | Freq: Four times a day (QID) | INTRAMUSCULAR | Status: DC | PRN
  Administered 2014-12-05 – 2014-12-12 (×4): 4 mg via INTRAVENOUS
  Filled 2014-12-04 (×4): qty 2

## 2014-12-04 MED ORDER — CLONIDINE HCL 0.2 MG PO TABS
0.2000 mg | ORAL_TABLET | Freq: Three times a day (TID) | ORAL | Status: DC
  Administered 2014-12-04 – 2014-12-19 (×42): 0.2 mg via ORAL
  Filled 2014-12-04 (×14): qty 1
  Filled 2014-12-04: qty 2
  Filled 2014-12-04 (×27): qty 1

## 2014-12-04 MED ORDER — SODIUM CHLORIDE 0.9 % IJ SOLN
3.0000 mL | INTRAMUSCULAR | Status: DC | PRN
  Administered 2014-12-06 – 2014-12-17 (×3): 3 mL via INTRAVENOUS
  Filled 2014-12-04 (×3): qty 3

## 2014-12-04 MED ORDER — SODIUM CHLORIDE 0.9 % IV SOLN: 250.0000 mL | INTRAVENOUS | Status: DC | PRN

## 2014-12-04 MED ORDER — IPRATROPIUM BROMIDE 0.02 % IN SOLN
0.5000 mg | Freq: Four times a day (QID) | RESPIRATORY_TRACT | Status: DC
  Administered 2014-12-05 – 2014-12-08 (×12): 0.5 mg via RESPIRATORY_TRACT
  Filled 2014-12-04 (×13): qty 2.5

## 2014-12-04 MED ORDER — ATORVASTATIN CALCIUM 40 MG PO TABS
40.0000 mg | ORAL_TABLET | Freq: Every day | ORAL | Status: DC
  Administered 2014-12-04 – 2014-12-18 (×15): 40 mg via ORAL
  Filled 2014-12-04 (×15): qty 1

## 2014-12-04 MED ORDER — PHENYTOIN SODIUM EXTENDED 100 MG PO CAPS: 200.0000 mg | ORAL_CAPSULE | Freq: Every day | ORAL | Status: DC

## 2014-12-04 MED ORDER — ACETAMINOPHEN 325 MG PO TABS
650.0000 mg | ORAL_TABLET | ORAL | Status: DC | PRN
  Administered 2014-12-06 – 2014-12-08 (×3): 650 mg via ORAL
  Filled 2014-12-04 (×4): qty 2

## 2014-12-04 MED ORDER — TORSEMIDE 20 MG PO TABS: 50.0000 mg | ORAL_TABLET | Freq: Two times a day (BID) | ORAL | Status: DC

## 2014-12-04 NOTE — ED Provider Notes (Signed)
This chart was scribed for [place], DO by Forrestine Him, ED Scribe. This patient was seen in room APA02/APA02 and the patient's care was started 9:58 AM.   TIME SEEN: 9:58 AM  CHIEF COMPLAINT:  Chief Complaint  Patient presents with  . Shortness of Breath     HPI:  HPI Comments: [name] [name] is a 68 y.o. male with a PMHx of HTN, hypothyroid, CHF, COPD and DM who presents to the Emergency Department complaining of ongoing shortness of breath that is chronic in nature and worsened today. Pt also reports intermittent myalgias, diarrhea, chest discomfort, dry cough, and vomiting. He also describes pains in his epigastrium and right upper quadrant as sharp without any aggravating or alleviating factors. Last bowel movement this morning; normal. No recent bloody stools or melena. No recent fever, hematuria, or dysuria. Pt received both flu shot and pneumonia vaccination this year. No recent surgeries, fracture, trauma. No history of PE or DVT. No recent sick contacts. Pt uses 2 liters of oxygen at all times at home. No known allergies to medications.  ROS: See HPI Constitutional: no fever  Eyes: no drainage  ENT: no runny nose. Positive for cough Cardiovascular:  Positive for chest pain  Resp: Positive for SOB  GI: Positive for diarrhea and vomiting GU: no dysuria or hematuria Integumentary: no rash  Allergy: no hives  Musculoskeletal: no leg swelling. Positive for myalgias Neurological: no slurred speech ROS otherwise negative  PAST MEDICAL HISTORY/PAST SURGICAL HISTORY:  Past Medical History  Diagnosis Date  . Chronic obstructive pulmonary disease     Chronic bronchitis;  home oxygen; multiple exacerbations  . Hypertension   . Cellulitis of lower leg   . Sleep apnea     Severe on a sleep study in 12/2010  . Hypothyroid     10/2002: TSH-0.43, T4-0.77  . Tobacco abuse, in remission     20 pack years; discontinued 1998  . Hyperlipidemia     No lipid profile available  .  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 Morbid obesity with BMI of 50.0-59.9, adult 10/28/2012  . Seizure disorder     Onset after craniotomy  . Aortic valve disease 10/2012    Prominent diastolic murmur; mild to moderate aortic insufficiency on echocardiogram in 10/2012  . Chronic kidney disease     S/P right nephrectomy for hypernephroma in 2010  . Seizures   . CHF (congestive heart failure)   . Diabetes mellitus without complication     MEDICATIONS:  Prior to Admission medications   Medication Sig Start Date End Date Taking? Authorizing Provider  albuterol (PROVENTIL) (2.5 MG/3ML) 0.083% nebulizer solution Take 3 mLs (2.5 mg total) by nebulization every 6 (six) hours as needed for wheezing. 10/31/12   Alonza Bogus, MD  amLODipine (NORVASC) 10 MG tablet Take 10 mg by mouth daily.    Historical Provider, MD  budesonide-formoterol (SYMBICORT) 160-4.5 MCG/ACT inhaler Inhale 2 puffs into the lungs 2 (two) times daily.    Historical Provider, MD  cloNIDine (CATAPRES) 0.2 MG tablet Take 1 tablet (0.2 mg total) by mouth 3 (three) times daily. 11/16/14   Herminio Commons, MD  desmopressin (DDAVP) 0.2 MG tablet Take 1 tablet (0.2 mg total) by mouth 2 (two) times daily. 06/14/14   [name], MD  dexamethasone (DECADRON) 0.5 MG tablet Take 0.5 mg by mouth daily.    Historical Provider, MD  ipratropium (ATROVENT) 0.02 % nebulizer solution Take 0.5 mg by nebulization 4 (four) times daily.    Historical Provider, MD  levothyroxine (SYNTHROID, LEVOTHROID) 175 MCG tablet Take 175 mcg by mouth daily before breakfast.    Historical Provider, MD  linagliptin (TRADJENTA) 5 MG TABS tablet Take 5 mg by mouth daily.    Historical Provider, MD  loratadine (CLARITIN) 10 MG tablet Take 10 mg by mouth daily as needed for allergies.     Historical Provider, MD  metolazone (ZAROXOLYN) 5 MG tablet TAKE 1  TABLET BY MOUTH ONCE WEEKLY AS NEEDED FOR WEIGHT GAIN. 11/06/14   Herminio Commons, MD  metolazone (ZAROXOLYN) 5 MG tablet Take 5 mg by mouth once a week. As needed for weight gain.    Historical Provider, MD  mometasone (NASONEX) 50 MCG/ACT nasal spray Place 2 sprays into the nose daily. 03/25/14   [name], MD  omeprazole (PRILOSEC) 20 MG capsule Take 20 mg by mouth daily.    Historical Provider, MD  oxymetazoline (AFRIN NASAL SPRAY) 0.05 % nasal spray Place 1 spray into both nostrils 2 (two) times daily. 03/25/14   [name], MD  phenytoin (DILANTIN) 100 MG ER capsule Take 200-300 mg by mouth daily. Takes 200 mg on Monday and Thursdsay. On all other days of the week takes 300 mg.    Historical Provider, MD  simvastatin (ZOCOR) 80 MG tablet Take 80 mg by mouth daily.     Historical Provider, MD  torsemide (DEMADEX) 100 MG tablet TAKE 1/2 TABLET BY MOUTH TWICE DAILY. 09/21/14   Herminio Commons, MD  torsemide (DEMADEX) 100 MG tablet Take 100 mg by mouth 2 (two) times daily.    Historical Provider, MD    ALLERGIES:  No Known Allergies  SOCIAL HISTORY:  History  Substance Use Topics  . Smoking status: Former Smoker    Types: Cigarettes    Quit date: 11/16/1989  . Smokeless tobacco: Never Used  . Alcohol Use: No    FAMILY HISTORY: Family History  Problem Relation Age of Onset  . Cancer Mother   . Cancer Father   . Cancer Sister   . Heart failure Sister   . Cancer Brother     EXAM: BP 140/49 mmHg  Pulse 86  Temp(Src) 99.1 F (37.3 C) (Oral)  Resp 22  Ht 6\' 4"  (1.93 m)  Wt 280 lb (127.007 kg)  BMI 34.10 kg/m2  SpO2 98% CONSTITUTIONAL: Alert and oriented and responds appropriately to questions. Well-appearing; well-nourished HEAD: Normocephalic EYES: Conjunctivae clear, PERRL ENT: normal nose; no rhinorrhea; moist mucous membranes; pharynx without lesions noted NECK: Supple, no meningismus, no LAD  CARD: RRR; S1 and S2 appreciated; no murmurs, no clicks,  no rubs, no gallops RESP: Normal chest excursion without splinting or tachypnea; breath sounds clear and equal bilaterally; no wheezes, no rhonchi, no rales, or hypoxia respiratory distress, speaking full sentences, on oxygen chronically, slightly diminished at his bases ABD/GI: Normal bowel sounds; non-distended; soft, no rebound, no guarding. Tenderness to palpation over RUQ with a positive Murphy sign, also tender in the epigastric region without peritoneal signs BACK:  The back appears normal and is non-tender to palpation, there is no CVA tenderness EXT: Normal ROM in all joints; non-tender to palpation; bilateral lower extremity non-hitting edema; normal capillary refill; no cyanosis    SKIN: Normal color for age and race; warm NEURO: Moves all extremities equally, sensation to light touch intact diffusely PSYCH: The patient's mood and manner are appropriate. Grooming and personal  hygiene are appropriate.  MEDICAL DECISION MAKING: Patient here with chest pain, shortness of breath and also with abdominal pain, vomiting, chills and body aches. History is very limited as he has an extremely poor historian as is his wife. PCP is Dr. Legrand Rams.  Does appear patient has worsening of his chronic chest pain and shortness of breath. Per patient's daughter he has a cardiac catheterization scheduled at [facility] tomorrow at 9 AM. Will obtain cardiac labs, abdominal labs, chest x-ray and right upper quadrant ultrasound. We'll give pain and nausea medicine. We'll keep nothing by mouth at this time.  ED PROGRESS: Patient's first troponin is negative. He does have mild elevation of his creatinine which is baseline. Lipase is also elevated. Chest x-ray clear. Right upper quadrant ultrasound shows signs of a cholecystic fluid and he also has a positive Murphy sign concerning for possible acalculous cholecystitis. [name] no fever, leukocytosis or elevation of his LFTs. I feel like acalculous cholecystitis is less likely.  He does have a heterogeneous, hypoechoic pancreas which may reflect pancreatitis. Given his abdominal pain on exam, slightly elevated lipase I feel like this is most likely the cause of his abdominal pain, nausea and vomiting. However given his history of chest pain and his plan to have a cardiac catheterization tomorrow will consult cardiology. Will also consult surgery given his ultrasound findings.   2:00 PM  Spoke with Dr. Pernell Dupre with cardiology who agrees the patient should be transferred to [facility]. He dates the patient will need medical and surgical evaluation but agrees patient is a poor surgical candidate given his multiple medical problems. He was scheduled for cardiac catheterization at 9 AM tomorrow.  Also spoke with Dr. Excell Seltzer with general surgery.  They will also see pt in consult and agree with medicine admission at [facility].  2:30 PM  Spoke with Dr. Sarajane Jews with hospitalist service who agrees with medical admission. Patient is aware that he will be waiting for a telemetry bed at [facility] for several hours. I do not feel he needs to emergently see cardiology or surgery as I feel his pain today is secondary to pancreatitis and I have a lower suspicion for acalculous cholecystitis. He does not have EKG changes today and has had 2 negative cardiac enzymes. I do not feel he emergently needs to go to the cardiac catheterization lab. [name] very well-appearing, comfortable, no complaints other than asking for some to eat.    Date: 12/04/2014 9:45 AM  Rate: 87  Rhythm: normal sinus rhythm  QRS Axis: Left axis  Intervals: Right bundle branch block  ST/T Wave abnormalities: normal  Conduction Disutrbances: none  Narrative Interpretation: Left axis deviation, right bundle branch block, LVH, no change when  compared to EKG July 2015     EKG Interpretation  Date/Time:  Monday December 04 2014 10:35:11 EST Ventricular Rate:  82 PR Interval:  218 QRS Duration: 176 QT  Interval:  489 QTC Calculation: 571 R Axis:   -71 Text Interpretation:  Sinus rhythm Borderline prolonged PR interval Probable left atrial enlargement Right bundle branch block LVH with IVCD and secondary repol abnrm Prolonged QT interval Baseline wander in lead(s) V6 No significant change since last tracing Confirmed by Tyrone Balash,  DO, Devanny Palecek ([id number]) on 12/04/2014 10:51:55 AM          I personally performed the services described in this documentation, which was scribed in my presence. The recorded information has been reviewed and is accurate.    Cyril Mourning  N Jakyria Bleau, DO 12/04/14 2257

## 2014-12-04 NOTE — ED Notes (Signed)
[phone], Lorraine Lax (daughter) please call with pt disposition.
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
## 2014-12-04 NOTE — ED Notes (Signed)
Pt reports SOB and generalized body aches for last several days. Pt reports is on 2liters of o2 all the time.

## 2014-12-04 NOTE — ED Notes (Signed)
MD at bedside. 
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
## 2014-12-05 ENCOUNTER — Encounter (HOSPITAL_COMMUNITY)
Admission: EM | Disposition: A | Discharge status: Skilled Nursing Facility | Payer: Self-pay | Source: Home / Self Care | Attending: Internal Medicine

## 2014-12-05 ENCOUNTER — Inpatient Hospital Stay (HOSPITAL_COMMUNITY): Payer: Medicare HMO

## 2014-12-05 ENCOUNTER — Ambulatory Visit (HOSPITAL_COMMUNITY): Admission: RE | Admit: 2014-12-05 | Payer: Medicare HMO | Source: Ambulatory Visit | Admitting: Cardiovascular Disease

## 2014-12-05 DIAGNOSIS — R109 Unspecified abdominal pain: Secondary | ICD-10-CM

## 2014-12-05 DIAGNOSIS — I359 Nonrheumatic aortic valve disorder, unspecified: Secondary | ICD-10-CM

## 2014-12-05 DIAGNOSIS — K819 Cholecystitis, unspecified: Secondary | ICD-10-CM

## 2014-12-05 DIAGNOSIS — R0602 Shortness of breath: Secondary | ICD-10-CM | POA: Diagnosis present

## 2014-12-05 DIAGNOSIS — E230 Hypopituitarism: Secondary | ICD-10-CM

## 2014-12-05 LAB — BASIC METABOLIC PANEL
Anion gap: 9 (ref 5–15)
BUN: 55 mg/dL — ABNORMAL HIGH (ref 6–23)
CALCIUM: 8.8 mg/dL (ref 8.4–10.5)
CO2: 26 mmol/L (ref 19–32)
Chloride: 105 mEq/L (ref 96–112)
Creatinine, Ser: 2.66 mg/dL — ABNORMAL HIGH (ref 0.50–1.35)
GFR calc Af Amer: 26 mL/min — ABNORMAL LOW (ref >90)
GFR calc non Af Amer: 23 mL/min — ABNORMAL LOW (ref >90)
GLUCOSE: 107 mg/dL — AB (ref 70–99)
Potassium: 4.2 mmol/L (ref 3.5–5.1)
Sodium: 140 mmol/L (ref 135–145)

## 2014-12-05 LAB — GLUCOSE, CAPILLARY
Glucose-Capillary: 108 mg/dL — ABNORMAL HIGH (ref 70–99)
Glucose-Capillary: 87 mg/dL (ref 70–99)
Glucose-Capillary: 102 mg/dL — ABNORMAL HIGH (ref 70–99)

## 2014-12-05 LAB — TROPONIN I
TROPONIN I: 0.03 ng/mL (ref <0.031)
Troponin I: 0.03 ng/mL (ref <0.031)

## 2014-12-05 LAB — BRAIN NATRIURETIC PEPTIDE: B NATRIURETIC PEPTIDE 5: 963 pg/mL — AB (ref 0.0–100.0)

## 2014-12-05 SURGERY — LEFT AND RIGHT HEART CATHETERIZATION WITH CORONARY ANGIOGRAM
Anesthesia: LOCAL

## 2014-12-05 MED ORDER — BENZONATATE 100 MG PO CAPS: 100.0000 mg | ORAL_CAPSULE | Freq: Two times a day (BID) | ORAL | Status: DC | PRN

## 2014-12-05 MED ORDER — TECHNETIUM TC 99M MEBROFENIN IV KIT
5.0000 | PACK | Freq: Once | INTRAVENOUS | Status: AC | PRN
  Administered 2014-12-05: 5 via INTRAVENOUS

## 2014-12-05 MED ORDER — FUROSEMIDE 10 MG/ML IJ SOLN
80.0000 mg | Freq: Three times a day (TID) | INTRAMUSCULAR | Status: DC
  Administered 2014-12-05 – 2014-12-06 (×5): 80 mg via INTRAVENOUS
  Filled 2014-12-05 (×6): qty 8

## 2014-12-05 MED ORDER — CETYLPYRIDINIUM CHLORIDE 0.05 % MT LIQD
7.0000 mL | Freq: Two times a day (BID) | OROMUCOSAL | Status: DC
  Administered 2014-12-05 – 2014-12-19 (×24): 7 mL via OROMUCOSAL

## 2014-12-05 MED ORDER — SINCALIDE 5 MCG IJ SOLR
0.0200 ug/kg | Freq: Once | INTRAMUSCULAR | Status: AC
  Administered 2014-12-05: 2.51 ug via INTRAVENOUS

## 2014-12-05 MED ORDER — SINCALIDE 5 MCG IJ SOLR
INTRAMUSCULAR | Status: AC
Start: 2014-12-05 — End: 2014-12-06
  Filled 2014-12-05: qty 5

## 2014-12-05 MED ORDER — SINCALIDE 5 MCG IJ SOLR
INTRAMUSCULAR | Status: AC
  Administered 2014-12-05: 2.51 ug via INTRAVENOUS
  Filled 2014-12-05: qty 5

## 2014-12-05 MED ORDER — HYDROCOD POLST-CHLORPHEN POLST 10-8 MG/5ML PO LQCR: 5.0000 mL | Freq: Two times a day (BID) | ORAL | Status: DC | PRN

## 2014-12-05 NOTE — Progress Notes (Signed)
[facility] Progress Note    Subjective: Patient sitting up in chair.  Awaiting HIDA scan.  Hungry.  Objective: Vital signs in last 24 hours: Temp:  [97.5 F (36.4 C)-99.1 F (37.3 C)] 98.3 F (36.8 C) (12/29 0430) Pulse Rate:  [80-86] 82 (12/29 0430) Resp:  [12-28] 18 (12/29 0430) BP: (115-142)/(44-63) 115/44 mmHg (12/29 0430) SpO2:  [93 %-100 %] 98 % (12/29 0815) Weight:  [125.42 kg (276 lb 8 oz)-127.007 kg (280 lb)] 125.42 kg (276 lb 8 oz) (12/29 0400) Last BM Date: 12/04/14 (per patient)  Intake/Output from previous day: 12/28 0701 - 12/29 0700 In: 770 [P.O.:720; IV Piggyback:50] Out: 9892 [Urine:1075] Intake/Output this shift:    General: No acute distress  Lungs: Clear  Abd: Soft, good bowel sounds.  Had flatus.  Mildly tender in the RUQ, but most of his tenderness is i n his RLQ.  Extremities: No changes  Neuro: Intact  Lab Results:  @LABLAST2 (wbc:2,hgb:2,hct:2,plt:2) BMET  Recent Labs  12/04/14 0959 12/04/14 1809  NA 139  --   K 4.7  --   CL 104  --   CO2 29  --   GLUCOSE 108*  --   BUN 53*  --   CREATININE 2.37* 2.40*  CALCIUM 8.5  --    PT/INR No results for input(s): LABPROT, INR in the last 72 hours. ABG No results for input(s): PHART, HCO3 in the last 72 hours.  Invalid input(s): PCO2, PO2  Studies/Results: Dg Chest Portable 1 View  12/04/2014   CLINICAL DATA:  Shortness of breath.  Chest pain.  EXAM: PORTABLE CHEST - 1 VIEW  COMPARISON:  06/11/2014  FINDINGS: Stable cardiomegaly. Vascular congestion and mild interstitial edema, similar to prior exam. There is blunting of both costophrenic angles, likely small effusions. No definite confluent airspace disease. No pneumothorax.  IMPRESSION: Findings consistent with mild CHF.   Electronically Signed   By: Jeb Levering M.D.   On: 12/04/2014 10:45   US Abdomen Limited Ruq  12/04/2014   CLINICAL DATA:  Abdominal pain for 2 days.  Prior right nephrectomy.  EXAM: US ABDOMEN LIMITED - RIGHT  UPPER QUADRANT  COMPARISON:  08/08/2014 CT abdomen and pelvis  FINDINGS: Gallbladder:  Hydropic gallbladder measuring approximately 6 cm in diameter and 12 cm in length. There may be a small amount of pericholecystic fluid. Gallbladder wall is not thickened, measuring 2 mm. There is report of a positive sonographic Murphy's sign, although the patient complained of pain over the gallbladder and pancreas.  Common bile duct:  Diameter: 4.5 mm  Liver:  Mildly enlarged, with a length of 17.2 cm. No focal lesion identified. Within normal limits in parenchymal echogenicity.  Limited imaging of the pancreas demonstrated a diffusely heterogeneous and hypoechoic echotexture. Small right pleural effusion was noted.  IMPRESSION: 1. Hydropic gallbladder. No gallstones or gallbladder wall thickening, however possible positive sonographic Murphy's sign and trace pericholecystic fluid are present and acalculous cholecystitis is not excluded. Nuclear medicine HIDA scan could be performed to evaluate for cystic duct obstruction as clinically indicated. 2. No biliary dilatation. 3. Heterogeneous, hypoechoic pancreas which may reflect pancreatitis. Correlation with serum lipase level recommended. 4. Small right pleural effusion.   Electronically Signed   By: Logan Bores   On: 12/04/2014 12:02    Anti-infectives: Anti-infectives    Start     Dose/Rate Route Frequency Ordered Stop   12/04/14 1830  cefTRIAXone (ROCEPHIN) 1 g in dextrose 5 % 50 mL IVPB - Premix     1 g100  mL/hr over 30 Minutes Intravenous Every 24 hours 12/04/14 1727        Assessment/Plan: s/p Procedure(s): LEFT AND RIGHT HEART CATHETERIZATION WITH CORONARY ANGIOGRAM Awaiting HIDA scan results.  LOS: 1 day   Kathryne Eriksson. Dahlia Bailiff, MD, FACS [phone] [facility] 12/05/2014

## 2014-12-05 NOTE — Progress Notes (Signed)
Subjective:  SOB improved. Difficult to obtain clear history. Family in room.   Objective:  Vital Signs in the last 24 hours: Temp:  [97.5 F (36.4 C)-98.4 F (36.9 C)] 97.8 F (36.6 C) (12/29 1405) Pulse Rate:  [81-88] 88 (12/29 1405) Resp:  [12-25] 18 (12/29 1405) BP: (115-142)/(44-59) 141/51 mmHg (12/29 1405) SpO2:  [93 %-100 %] 97 % (12/29 1405) Weight:  [276 lb 8 oz (125.42 kg)-276 lb 12.8 oz (125.556 kg)] 276 lb 8 oz (125.42 kg) (12/29 0400)  Intake/Output from previous day: 12/28 0701 - 12/29 0700 In: 770 [P.O.:720; IV Piggyback:50] Out: 6160 [Urine:1075]   Physical Exam: General: Well developed, well nourished, in no acute distress. Head:  Normocephalic and atraumatic. Lungs: Clear to auscultation and percussion. Heart: Normal S1 and S2. RRR grade 3/6 diastolic decrescendo murmur and grade 2/6 systolic ejection murmur at the left sternal border  Abdomen: soft, non-tender, positive bowel sounds. obese Extremities: No clubbing or cyanosis. 2+ pretibial edema with chronic stasis dermatitis noted bilaterally. Neurologic: Alert and oriented x 3.    Lab Results:  Recent Labs  12/04/14 0959 12/04/14 1809  WBC 7.2 6.3  HGB 12.0* 11.7*  PLT 158 177    Recent Labs  12/04/14 0959 12/04/14 1809  NA 139  --   K 4.7  --   CL 104  --   CO2 29  --   GLUCOSE 108*  --   BUN 53*  --   CREATININE 2.37* 2.40*    Recent Labs  12/04/14 2312 12/05/14 0550  TROPONINI 0.03 0.03   Hepatic Function Panel  Recent Labs  12/04/14 0959  PROT 6.7  ALBUMIN 3.5  AST 18  ALT 15  ALKPHOS 68  BILITOT 0.5  BILIDIR 0.1  IBILI 0.4    Imaging: Dg Chest Portable 1 View  12/04/2014   CLINICAL DATA:  Shortness of breath.  Chest pain.  EXAM: PORTABLE CHEST - 1 VIEW  COMPARISON:  06/11/2014  FINDINGS: Stable cardiomegaly. Vascular congestion and mild interstitial edema, similar to prior exam. There is blunting of both costophrenic angles, likely small effusions. No  definite confluent airspace disease. No pneumothorax.  IMPRESSION: Findings consistent with mild CHF.   Electronically Signed   By: Jeb Levering M.D.   On: 12/04/2014 10:45   US Abdomen Limited Ruq  12/04/2014   CLINICAL DATA:  Abdominal pain for 2 days.  Prior right nephrectomy.  EXAM: US ABDOMEN LIMITED - RIGHT UPPER QUADRANT  COMPARISON:  08/08/2014 CT abdomen and pelvis  FINDINGS: Gallbladder:  Hydropic gallbladder measuring approximately 6 cm in diameter and 12 cm in length. There may be a small amount of pericholecystic fluid. Gallbladder wall is not thickened, measuring 2 mm. There is report of a positive sonographic Murphy's sign, although the patient complained of pain over the gallbladder and pancreas.  Common bile duct:  Diameter: 4.5 mm  Liver:  Mildly enlarged, with a length of 17.2 cm. No focal lesion identified. Within normal limits in parenchymal echogenicity.  Limited imaging of the pancreas demonstrated a diffusely heterogeneous and hypoechoic echotexture. Small right pleural effusion was noted.  IMPRESSION: 1. Hydropic gallbladder. No gallstones or gallbladder wall thickening, however possible positive sonographic Murphy's sign and trace pericholecystic fluid are present and acalculous cholecystitis is not excluded. Nuclear medicine HIDA scan could be performed to evaluate for cystic duct obstruction as clinically indicated. 2. No biliary dilatation. 3. Heterogeneous, hypoechoic pancreas which may reflect pancreatitis. Correlation with serum lipase level recommended. 4. Small right  pleural effusion.   Electronically Signed   By: Logan Bores   On: 12/04/2014 12:02    Assessment/Plan:  Active Problems:   Tobacco abuse, in remission   Panhypopituitarism   Aortic valve disease   Diabetes   Chest pain   CKD (chronic kidney disease), stage IV   Acute pancreatitis   Acalculous cholecystitis   Abdominal pain   Acute on chronic combined systolic and diastolic CHF (congestive heart  failure)   Pancreatitis  68 year old with possible cholecystitis, CKD 4, severe AI, acute systolic/diastolic HF, obesity.  -await results of HIDA and final surgical recs -if no acute intraabdominal process, will proceed with left and right heart cath as previously planned once comfortable from HF perspective.  -Agree with Dr. Burt Knack - high risk for renal failure with either cath or surgery to replace AV.  -Continue with lasix 80 IV BID (net out 1liter). Will increase lasix to 80 IV TID.    Dameka Younker, [place] 12/05/2014, 2:40 PM

## 2014-12-05 NOTE — Progress Notes (Addendum)
Patient Demographics  [name] [name], is a 68 y.o. male, DOB - 08/28/1946, GEX:[id number]  Admit date - 12/04/2014   Admitting Physician Troy Sine, MD  Outpatient Primary MD for the patient is [name], MD  LOS - 1   Chief Complaint  Patient presents with  . Shortness of Breath      Admission history of present illness/brief narrative: 68 year-old gentleman presenting to the Emergency Dept today with multiple complaints. These include chest pain, shortness of breath, and RUQ pain. He has been previously scheduled for elective cardiac catheterization 12/29 to evaluate severe aortic insufficiency and declining LV function. However, the patient came to the emergency department today because he has noticed an acute change in his symptoms over the last 24 hours. He complains of aching all over. He also has had some nausea and vomiting. He complains of pain in his upper abdominal area. He has also had chest pain. His breathing has been worse over the last 2-3 days. He does feel that his "retaining more fluid." He admits to dietary indiscretion with salt. He has been taking his medications. Right upper quadrant ultrasound was done and there concern about acalculous cholecystitis general surgery recommended HIDA scan.  Subjective:   [name] [name] today has, No headache, No chest pain, No abdominal pain - No Nausea, No new weakness tingling or numbness, No Cough - complaints of orthopnea.  Assessment & Plan    Active Problems:   Tobacco abuse, in remission   Panhypopituitarism   Aortic valve disease   Diabetes   Chest pain   CKD (chronic kidney disease), stage IV   Acute pancreatitis   Acalculous cholecystitis   Abdominal pain   Acute on chronic combined systolic and diastolic CHF (congestive heart failure)   Pancreatitis  Chest pain: - Resolved, most likely  related to right upper quadrant pain, cardiac enzymes plateaued, usually on board. - Cardiac resume board.  Abdominal pain, nausea, vomiting, -Questionable acalculous cholecystitis on ultrasound, plan is for [facility] scan today as per surgery. -Continue with IV Rocephin  Acute on chronic systolic/diastolic heart failure,  -LVEF 40-45 percent with diffuse hypokinesis, diastolic dysfunction, moderate to severe aortic regurgitation, severe pulmonary hypertension by echocardiogram 12/10. TEE and heart catheter suggested at that time for w/u for aortic regurgitation. - Evidence of volume overload but no evidence of pulmonary compromise. Further management and diuresis per cardiology. -On IV Lasix 80 mg twice a day. -Will need right and left heart cath and likely need aortic valve replacement in the setting of severe aortic insufficiency as per cardiology  Panhypopituitarism -Status post pituitary excision, continue with desmopressin, Decadron, Synthroid. -May need stress dose of steroid if going for surgery.  Seizure disorder -Status post craniectomy, continue with phenytoin.  Diabetes mellitus -Acceptable, continue with insulin sliding scale.  COPD -No active wheezing, on 2 L at baseline, appears stable  Acute on chronic renal failure -Appears to be at baseline, continue to monitor as on aggressive IV diuresis.  Code Status: Full  Family Communication: None at bedside  Disposition Plan: Remains on telemetry   Procedures  None   Consults   Cardiology Surgery   Medications  Scheduled Meds: . antiseptic oral rinse  7 mL Mouth Rinse BID  . aspirin  EC  325 mg Oral Daily  . atorvastatin  40 mg Oral q1800  . budesonide-formoterol  2 puff Inhalation BID  . cefTRIAXone (ROCEPHIN)  IV  1 g Intravenous Q24H  . cloNIDine  0.2 mg Oral TID  . desmopressin  0.2 mg Oral BID  . dexamethasone  0.5 mg Oral Daily  . furosemide  80 mg Intravenous BID  . heparin  5,000 Units Subcutaneous  3 times per day  . insulin aspart  0-9 Units Subcutaneous TID WC  . ipratropium  0.5 mg Nebulization QID  . levothyroxine  175 mcg Oral QAC breakfast  . phenytoin  200 mg Oral Once per day on Mon Thu  . phenytoin  300 mg Oral Once per day on Sun Tue Wed Fri Sat  . sincalide      . sodium chloride  3 mL Intravenous Q12H   Continuous Infusions:  PRN Meds:.sodium chloride, acetaminophen, ondansetron (ZOFRAN) IV, sodium chloride  DVT Prophylaxis   Heparin -  Lab Results  Component Value Date   PLT 177 12/04/2014    Antibiotics    Anti-infectives    Start     Dose/Rate Route Frequency Ordered Stop   12/04/14 1830  cefTRIAXone (ROCEPHIN) 1 g in dextrose 5 % 50 mL IVPB - Premix     1 g100 mL/hr over 30 Minutes Intravenous Every 24 hours 12/04/14 1727            Objective:   Filed Vitals:   12/04/14 2101 12/05/14 0400 12/05/14 0430 12/05/14 0815  BP:   115/44   Pulse:   82   Temp:   98.3 F (36.8 C)   TempSrc:   Oral   Resp:   18   Height:      Weight:  125.42 kg (276 lb 8 oz)    SpO2: 99%  99% 98%    Wt Readings from Last 3 Encounters:  12/05/14 125.42 kg (276 lb 8 oz)  11/16/14 127.007 kg (280 lb)  06/12/14 121.201 kg (267 lb 3.2 oz)     Intake/Output Summary (Last 24 hours) at 12/05/14 1330 Last data filed at 12/05/14 0800  Gross per 24 hour  Intake    770 ml  Output   1075 ml  Net   -305 ml     Physical Exam  Awake Alert, Oriented X 3, No new F.N deficits, Normal affect [place].AT,PERRAL Supple Neck,No JVD, No cervical lymphadenopathy appriciated.  Symmetrical Chest wall movement, Good air movement bilaterally, CTAB RRR,No Gallops,Rubs or new Murmurs, No Parasternal Heave +ve B.Sounds, Abd Soft, right upper quadrant tenderness, No organomegaly appriciated, No rebound - guarding or rigidity. No Cyanosis, Clubbing ,+2  edema, No new Rash or bruise    Data Review   Micro Results Recent Results (from the past 240 hour(s))  MRSA PCR Screening     Status:  None   Collection Time: 12/04/14  5:44 PM  Result Value Ref Range Status   MRSA by PCR NEGATIVE NEGATIVE Final    Comment:        The GeneXpert MRSA Assay (FDA approved for NASAL specimens only), is one component of a comprehensive MRSA colonization surveillance program. It is not intended to diagnose MRSA infection nor to guide or monitor treatment for MRSA infections.     Radiology Reports Dg Chest Portable 1 View  12/04/2014   CLINICAL DATA:  Shortness of breath.  Chest pain.  EXAM: PORTABLE CHEST - 1 VIEW  COMPARISON:  06/11/2014  FINDINGS: Stable cardiomegaly.  Vascular congestion and mild interstitial edema, similar to prior exam. There is blunting of both costophrenic angles, likely small effusions. No definite confluent airspace disease. No pneumothorax.  IMPRESSION: Findings consistent with mild CHF.   Electronically Signed   By: Jeb Levering M.D.   On: 12/04/2014 10:45   US Abdomen Limited Ruq  12/04/2014   CLINICAL DATA:  Abdominal pain for 2 days.  Prior right nephrectomy.  EXAM: US ABDOMEN LIMITED - RIGHT UPPER QUADRANT  COMPARISON:  08/08/2014 CT abdomen and pelvis  FINDINGS: Gallbladder:  Hydropic gallbladder measuring approximately 6 cm in diameter and 12 cm in length. There may be a small amount of pericholecystic fluid. Gallbladder wall is not thickened, measuring 2 mm. There is report of a positive sonographic Murphy's sign, although the patient complained of pain over the gallbladder and pancreas.  Common bile duct:  Diameter: 4.5 mm  Liver:  Mildly enlarged, with a length of 17.2 cm. No focal lesion identified. Within normal limits in parenchymal echogenicity.  Limited imaging of the pancreas demonstrated a diffusely heterogeneous and hypoechoic echotexture. Small right pleural effusion was noted.  IMPRESSION: 1. Hydropic gallbladder. No gallstones or gallbladder wall thickening, however possible positive sonographic Murphy's sign and trace pericholecystic fluid are  present and acalculous cholecystitis is not excluded. Nuclear medicine HIDA scan could be performed to evaluate for cystic duct obstruction as clinically indicated. 2. No biliary dilatation. 3. Heterogeneous, hypoechoic pancreas which may reflect pancreatitis. Correlation with serum lipase level recommended. 4. Small right pleural effusion.   Electronically Signed   By: Logan Bores   On: 12/04/2014 12:02    CBC  Recent Labs Lab 12/04/14 0959 12/04/14 1809  WBC 7.2 6.3  HGB 12.0* 11.7*  HCT 39.4 37.9*  PLT 158 177  MCV 89.5 87.5  MCH 27.3 27.0  MCHC 30.5 30.9  RDW 17.2* 17.2*  LYMPHSABS 1.2  --   MONOABS 0.8  --   EOSABS 0.8*  --   BASOSABS 0.1  --     Chemistries   Recent Labs Lab 12/04/14 0959 12/04/14 1809  [name] 139  --   K 4.7  --   CL 104  --   CO2 29  --   GLUCOSE 108*  --   BUN 53*  --   CREATININE 2.37* 2.40*  CALCIUM 8.5  --   AST 18  --   ALT 15  --   ALKPHOS 68  --   BILITOT 0.5  --    ------------------------------------------------------------------------------------------------------------------ estimated creatinine clearance is 39.7 mL/min (by C-G formula based on Cr of 2.4). ------------------------------------------------------------------------------------------------------------------ No results for input(s): HGBA1C in the last 72 hours. ------------------------------------------------------------------------------------------------------------------ No results for input(s): CHOL, HDL, LDLCALC, TRIG, CHOLHDL, LDLDIRECT in the last 72 hours. ------------------------------------------------------------------------------------------------------------------ No results for input(s): TSH, T4TOTAL, T3FREE, THYROIDAB in the last 72 hours.  Invalid input(s): FREET3 ------------------------------------------------------------------------------------------------------------------ No results for input(s): VITAMINB12, FOLATE, FERRITIN, TIBC, IRON, RETICCTPCT  in the last 72 hours.  Coagulation profile No results for input(s): INR, PROTIME in the last 168 hours.  No results for input(s): DDIMER in the last 72 hours.  Cardiac Enzymes  Recent Labs Lab 12/04/14 1809 12/04/14 2312 12/05/14 0550  TROPONINI 0.04* 0.03 0.03   ------------------------------------------------------------------------------------------------------------------ Invalid input(s): POCBNP     Time Spent in minutes   30 minutes   Braelynne Garinger M.D on 12/05/2014 at 1:30 PM  Between 7am to 7pm - Pager - [phone]  After 7pm go to [email or web address] - password [email or web address]  And look for the night  coverage person covering for me after hours  [facility] Office  [phone]   **Disclaimer: This note may have been dictated with voice recognition software. Similar sounding words can inadvertently be transcribed and this note may contain transcription errors which may not have been corrected upon publication of note.**

## 2014-12-06 ENCOUNTER — Other Ambulatory Visit: Payer: Self-pay

## 2014-12-06 ENCOUNTER — Inpatient Hospital Stay (HOSPITAL_COMMUNITY): Payer: Medicare HMO

## 2014-12-06 DIAGNOSIS — R079 Chest pain, unspecified: Secondary | ICD-10-CM | POA: Insufficient documentation

## 2014-12-06 DIAGNOSIS — A419 Sepsis, unspecified organism: Secondary | ICD-10-CM | POA: Diagnosis present

## 2014-12-06 DIAGNOSIS — E669 Obesity, unspecified: Secondary | ICD-10-CM

## 2014-12-06 DIAGNOSIS — I10 Essential (primary) hypertension: Secondary | ICD-10-CM

## 2014-12-06 DIAGNOSIS — R509 Fever, unspecified: Secondary | ICD-10-CM | POA: Diagnosis present

## 2014-12-06 LAB — BASIC METABOLIC PANEL
Anion gap: 9 (ref 5–15)
BUN: 54 mg/dL — AB (ref 6–23)
CHLORIDE: 103 meq/L (ref 96–112)
CO2: 26 mmol/L (ref 19–32)
Calcium: 8.7 mg/dL (ref 8.4–10.5)
Creatinine, Ser: 2.69 mg/dL — ABNORMAL HIGH (ref 0.50–1.35)
GFR calc non Af Amer: 22 mL/min — ABNORMAL LOW (ref >90)
GFR, EST AFRICAN AMERICAN: 26 mL/min — AB (ref >90)
GLUCOSE: 90 mg/dL (ref 70–99)
POTASSIUM: 4.7 mmol/L (ref 3.5–5.1)
Sodium: 138 mmol/L (ref 135–145)

## 2014-12-06 LAB — CBC
HCT: 38.2 % — ABNORMAL LOW (ref 39.0–52.0)
Hemoglobin: 11.4 g/dL — ABNORMAL LOW (ref 13.0–17.0)
MCH: 26.4 pg (ref 26.0–34.0)
MCHC: 29.8 g/dL — ABNORMAL LOW (ref 30.0–36.0)
MCV: 88.4 fL (ref 78.0–100.0)
PLATELETS: 183 10*3/uL (ref 150–400)
RBC: 4.32 MIL/uL (ref 4.22–5.81)
RDW: 17.3 % — ABNORMAL HIGH (ref 11.5–15.5)
WBC: 6.1 10*3/uL (ref 4.0–10.5)

## 2014-12-06 LAB — HEPATIC FUNCTION PANEL
ALT: 12 U/L (ref 0–53)
AST: 17 U/L (ref 0–37)
Albumin: 3.3 g/dL — ABNORMAL LOW (ref 3.5–5.2)
Alkaline Phosphatase: 55 U/L (ref 39–117)
Bilirubin, Direct: 0.2 mg/dL (ref 0.0–0.3)
Indirect Bilirubin: 0.6 mg/dL (ref 0.3–0.9)
TOTAL PROTEIN: 6.7 g/dL (ref 6.0–8.3)
Total Bilirubin: 0.8 mg/dL (ref 0.3–1.2)

## 2014-12-06 LAB — GLUCOSE, CAPILLARY
GLUCOSE-CAPILLARY: 82 mg/dL (ref 70–99)
GLUCOSE-CAPILLARY: 96 mg/dL (ref 70–99)
GLUCOSE-CAPILLARY: 87 mg/dL (ref 70–99)
Glucose-Capillary: 50 mg/dL — ABNORMAL LOW (ref 70–99)
Glucose-Capillary: 90 mg/dL (ref 70–99)
Glucose-Capillary: 139 mg/dL — ABNORMAL HIGH (ref 70–99)
Glucose-Capillary: 177 mg/dL — ABNORMAL HIGH (ref 70–99)

## 2014-12-06 LAB — LIPASE, BLOOD: Lipase: 47 U/L (ref 11–59)

## 2014-12-06 LAB — LACTIC ACID, PLASMA: Lactic Acid, Venous: 2.7 mmol/L — ABNORMAL HIGH (ref 0.5–2.2)

## 2014-12-06 MED ORDER — PIPERACILLIN-TAZOBACTAM 3.375 G IVPB
3.3750 g | Freq: Three times a day (TID) | INTRAVENOUS | Status: DC
Start: 2014-12-06 — End: 2014-12-09
  Administered 2014-12-06 – 2014-12-09 (×9): 3.375 g via INTRAVENOUS
  Filled 2014-12-06 (×13): qty 50

## 2014-12-06 MED ORDER — VANCOMYCIN HCL 10 G IV SOLR
1750.0000 mg | INTRAVENOUS | Status: DC
  Administered 2014-12-08: 1750 mg via INTRAVENOUS
  Filled 2014-12-06: qty 1750

## 2014-12-06 MED ORDER — GLUCAGON HCL RDNA (DIAGNOSTIC) 1 MG IJ SOLR
INTRAMUSCULAR | Status: AC
  Administered 2014-12-06: 1 mg
  Filled 2014-12-06: qty 1

## 2014-12-06 MED ORDER — PIPERACILLIN-TAZOBACTAM 3.375 G IVPB 30 MIN
3.3750 g | Freq: Once | INTRAVENOUS | Status: AC
  Administered 2014-12-06: 3.375 g via INTRAVENOUS
  Filled 2014-12-06: qty 50

## 2014-12-06 MED ORDER — METOPROLOL TARTRATE 1 MG/ML IV SOLN
INTRAVENOUS | Status: AC
  Filled 2014-12-06: qty 5

## 2014-12-06 MED ORDER — METOPROLOL TARTRATE 1 MG/ML IV SOLN
5.0000 mg | Freq: Once | INTRAVENOUS | Status: AC
  Administered 2014-12-06: 5 mg via INTRAVENOUS

## 2014-12-06 MED ORDER — ACETAMINOPHEN 650 MG RE SUPP
650.0000 mg | RECTAL | Status: DC | PRN
  Administered 2014-12-06 (×2): 650 mg via RECTAL
  Filled 2014-12-06 (×2): qty 1

## 2014-12-06 MED ORDER — GLUCAGON HCL RDNA (DIAGNOSTIC) 1 MG IJ SOLR: 1.0000 mg | Freq: Once | INTRAMUSCULAR | Status: AC | PRN

## 2014-12-06 MED ORDER — VANCOMYCIN HCL 10 G IV SOLR
2000.0000 mg | Freq: Once | INTRAVENOUS | Status: AC
  Administered 2014-12-06: 2000 mg via INTRAVENOUS
  Filled 2014-12-06: qty 2000

## 2014-12-06 MED ORDER — CEFTRIAXONE SODIUM IN DEXTROSE 20 MG/ML IV SOLN
1.0000 g | Freq: Once | INTRAVENOUS | Status: DC
  Filled 2014-12-06: qty 50

## 2014-12-06 NOTE — Progress Notes (Signed)
Patients HIDA is negative for signs of acute cholecystitis, LFT's and WBC are normal.  No urgent need for cholecystectomy.  Priority should be cardiac treatment.  He can follow up with [place] in the office once he is improved if needed.  Coralie Keens, PA-C General Surgery [facility] [phone] 12/06/2014

## 2014-12-06 NOTE — Progress Notes (Signed)
    Subjective:  Called to see pt for respiratory distress.  Objective:  Vital Signs in the last 24 hours: Temp:  [97.8 F (36.6 C)-98.2 F (36.8 C)] 98.1 F (36.7 C) (12/30 0630) Pulse Rate:  [83-133] 133 (12/30 0630) Resp:  [18-26] 26 (12/30 0630) BP: (118-188)/(43-75) 188/75 mmHg (12/30 0630) SpO2:  [95 %-100 %] 95 % (12/30 0630) Weight:  [274 lb 4.8 oz (124.422 kg)] 274 lb 4.8 oz (124.422 kg) (12/30 0446)  Intake/Output from previous day:  Intake/Output Summary (Last 24 hours) at 12/06/14 0707 Last data filed at 12/06/14 0447  Gross per 24 hour  Intake    360 ml  Output   1950 ml  Net  -1590 ml    Physical Exam: General appearance: shaking chills, awake and responsive Lungs: decreased breath sounds Heart: irregularly irregular rhythm and rapid rate   Rate: 130  Rhythm: sinus tachycardia and RBBB-   Lab Results:  Recent Labs  12/04/14 1809 12/06/14 0319  WBC 6.3 6.1  HGB 11.7* 11.4*  PLT 177 183    Recent Labs  12/05/14 1351 12/06/14 0319  NA 140 138  K 4.2 4.7  CL 105 103  CO2 26 26  GLUCOSE 107* 90  BUN 55* 54*  CREATININE 2.66* 2.69*    Recent Labs  12/04/14 2312 12/05/14 0550  TROPONINI 0.03 0.03   No results for input(s): INR in the last 72 hours.  Imaging: Imaging results have been reviewed  Cardiac Studies:  Assessment/Plan:  68 year-old gentleman presenting to the Emergency Dept 12/04/14 with multiple complaints. He has AR and an EF of 40-45%. He was felt to have acute on diastolic CHF.    Principal Problem:   Sepsis Active Problems:   SOB (shortness of breath)   Hypertension   Moderate aortic regurgitation   CKD (chronic kidney disease), stage IV   Acute on chronic combined systolic and diastolic CHF (congestive heart failure)   Obese   Sleep apnea   Panhypopituitarism   Diabetes   PLAN: Rectal temp 101. I ordered blood cultures and 1 gram Rocephin and Tylenol. I have contacted Dr Eliseo Squires. It appears he is now in AF-  will check 12 lead.   Kerin Ransom PA-C Beeper [phone] 12/06/2014, 7:07 AM   Personally seen and examined. Agree with above.  68 year old with severe AI, morbid obesity, CKD 4, RUQ abd pain, fever, HIDA neg.   Temp/ Fever. Rigors.  HIDA neg Severe AI ? Metabolic process. Had non specific abd complaints on admit.  Still has RUQ tenderness when deep palpation. Lipase normal. Tele reviewed: Now sinus rhythm/ sinus tach. Abrupt increase in heart rate earlier, may have been transient atrial flutter or atach. Regular.   Continue with IV lasix for now. BP actually elevated. Watch for any signs of hypotension however if he develops septic shock.   Further therapy per primary team. Vanc/Zosyn started.   Candee Furbish, MD

## 2014-12-06 NOTE — Progress Notes (Signed)
Patient heart rate increased 130's-140's, nurse found patient to be have labored breathing, shaking with c/o freezing @ time temperature in the room was 80 degrees. Charge nurse called to the room, patient assisted to bed and vitals taken. B/P 188/75, HR133, RR26, oral temp 98.1, blood sugar 82 and EKG Sinus Tach with wide QRS complex.  NP K.Schorr notified and PA [place] notified.  Rapid Response called to bedside. Rectal temperature taken 101.7 and 650mg  suppository Tylenol given @ 0704, 5 mg IV metoprolol given at 0701. New orders for blood cultures and IV abx ordered. Report given to oncoming RN. Micheline Rough, RN

## 2014-12-06 NOTE — Significant Event (Signed)
Rapid Response Event Note  Overview:  Handoff from April RN at 0700.  Called to see patient at 0630 for shaking Time Called: 0630 Arrival Time: 5427 Event Type: Other (Comment)  Initial Focused Assessment:  On my arrival patient supine in bed - hot and dry - flushed face - still with some mild rigors - rectal temp was 101.7 - Tylenol has been given - patient is arousable - oriented but lethargic without stimulation -  still with some tachynpnea -  - HR 110-135 - some runs of irregular beats - looks like afib/flutter - Bil BS = and clear - rr 26-32 - denies SOB or pain - no CP - abd large and soft = denies pain even with palpatation -  BP 188/75 O2 sats 95% on 2 liter nasal cannula.  Moderate distress.   Interventions:  Stat call to lab for blood cultures.  Repositioned and supported patient.  CBG 50 - glucagon given per RN Whitney.  Rigors calming - HR now 95 regular - to get 12 lead per MD order.  182/65 HR 119-95 RR 24.  Repeat CBG 96.  Patient more alert and comfortable at this point.  States he feels better.   Whitney speaking with Dr. Eliseo Squires - abx to start without blood cx if ready.  BP 144/87 HR 94 and regular - RR 22 O2 sats 95% on 2 liters.  Handoff to Whitney - encouraged to keep eye of blood sugar.  To call as needed.  Will follow    Event Summary: Name of Physician Notified: Kerin Ransom PA for cardiology at  (pta rrt)  Name of Consulting Physician Notified: Dr. Eliseo Squires at  (by Kerin Ransom PA)  Outcome: Stayed in room and stabalized  Event End Time: 0830  Quin Hoop

## 2014-12-06 NOTE — Care Management Note (Unsigned)
    Page 1 of 2   12/18/2014     3:45:47 PM CARE MANAGEMENT NOTE 12/18/2014  Patient:  [name] [name], [name] [name]   Account Number:  [id number]  Date Initiated:  12/06/2014  Documentation initiated by:  GRAVES-BIGELOW,Viriginia Amendola  Subjective/Objective Assessment:   Pt admitted for abdominal pain and CHF. Pt is from home alone with family support. Pt has DME 02 and RW.     Action/Plan:   Pt will benefit from [facility] for disease management. CM will continue to monitor for disposition needs.   Anticipated DC Date:  12/08/2014   Anticipated DC Plan:  [place]  CM consult      Choice offered to / List presented to:             Status of service:  In process, will continue to follow Medicare Important Message given?  YES (If response is "NO", the following Medicare IM given date fields will be blank) Date Medicare IM given:  12/07/2014 Medicare IM given by:  GRAVES-BIGELOW,Jaleea Alesi Date Additional Medicare IM given:  12/11/2014 Additional Medicare IM given by:  Maverick Patman GRAVES-BIGELOW  Discharge Disposition:    Per UR Regulation:  Reviewed for med. necessity/level of care/duration of stay  If discussed at [place] of Stay Meetings, dates discussed:   12/12/2014  12/14/2014  12/19/2014    Comments:  1544 12-18-14 Jacqlyn Krauss, RN,BSN [phone] Medicare Important Message given? YES (If response is "NO", the following Medicare IM given date fields will be blank) Date Medicare IM given: Medicare IM given by: Jacqlyn Krauss     12-14-14 1236 Medicare Important Message given? YES (If response is "NO", the following Medicare IM given date fields will be blank) Date Medicare IM given: Medicare IM given by: Jacqlyn Krauss    12-13-14 [address] RN,BSN [phone] Pt post cath 12-12-14 revealed Severely elevated right and left heart filling pressures. Plan for agressive diuresis, if Cr rises plan for HD. Plan is for SNF  once stable. CM will continue to monitor.   12-11-14 [address] [place] [phone] Plan for cardiac cath hopefully in am. Renal following pt. Plan once stable to SNF in [place].  12-06-14 [place], RN,BSN [phone] CM was able to speak to pt and daughter in ref to disposition needs. Per pt if the plan is for home he will need [facility] and CSW. CSW to assist with resources to assist pt with Medicaid. Pt uses [place] for medications and they deliver.  They also provide DME 02 for pt. Daughter did state that pt had used [facility] in the past and will like to use them again if plan is for home. Referral will need to be placed. CM did speak to pt about Meals on Wheels as well.  Pt states he will be agreeable to SNF if he needs to go. PT/OT will need to be ordered for recommendations. CM will continue to f/u.

## 2014-12-06 NOTE — Progress Notes (Signed)
ANTIBIOTIC CONSULT NOTE - INITIAL  Pharmacy Consult:  Vancomycin / Zosyn Indication:  Sepsis  No Known Allergies  Patient Measurements: Height: 6\' 1"  (185.4 cm) Weight: 274 lb 4.8 oz (124.422 kg) IBW/kg (Calculated) : 79.9  Vital Signs: Temp: 101.7 F (38.7 C) (12/30 0650) Temp Source: Rectal (12/30 0650) BP: 182/60 mmHg (12/30 0730) Pulse Rate: 95 (12/30 0730) Intake/Output from previous day: 12/29 0701 - 12/30 0700 In: 360 [P.O.:360] Out: 1950 [Urine:1950]  Labs:  Recent Labs  12/04/14 0959 12/04/14 1809 12/05/14 1351 12/06/14 0319  WBC 7.2 6.3  --  6.1  HGB 12.0* 11.7*  --  11.4*  PLT 158 177  --  183  CREATININE 2.37* 2.40* 2.66* 2.69*   Estimated Creatinine Clearance: 35.3 mL/min (by C-G formula based on Cr of 2.69). No results for input(s): VANCOTROUGH, VANCOPEAK, VANCORANDOM, GENTTROUGH, GENTPEAK, GENTRANDOM, TOBRATROUGH, TOBRAPEAK, TOBRARND, AMIKACINPEAK, AMIKACINTROU, AMIKACIN in the last 72 hours.   Microbiology: Recent Results (from the past 720 hour(s))  MRSA PCR Screening     Status: None   Collection Time: 12/04/14  5:44 PM  Result Value Ref Range Status   MRSA by PCR NEGATIVE NEGATIVE Final    Comment:        The GeneXpert MRSA Assay (FDA approved for NASAL specimens only), is one component of a comprehensive MRSA colonization surveillance program. It is not intended to diagnose MRSA infection nor to guide or monitor treatment for MRSA infections.     Medical History: Past Medical History  Diagnosis Date  . Chronic obstructive pulmonary disease     Chronic bronchitis;  home oxygen; multiple exacerbations  . Hypertension   . Cellulitis of lower leg   . Sleep apnea     Severe on a sleep study in 12/2010  . Hypothyroid     10/2002: TSH-0.43, T4-0.77  . Tobacco abuse, in remission     20 pack years; discontinued 1998  . Hyperlipidemia     No lipid profile available  . 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 Morbid obesity with BMI of 50.0-59.9, adult 10/28/2012  . Seizure disorder     Onset after craniotomy  . Aortic valve disease 10/2012    Prominent diastolic murmur; mild to moderate aortic insufficiency on echocardiogram in 10/2012  . Chronic kidney disease     S/P right nephrectomy for hypernephroma in 2010  . Seizures   . CHF (congestive heart failure)   . Diabetes mellitus without complication       Assessment: 2 YOM presented on 12/04/14 with chest pain, nausea, vomiting, and epigastric/RUQ pain.  Now with sepsis and pharmacy consulted to initiate vancomycin and Zosyn.  Noted patient has a history of CKD post right nephrectomy due to [place].  Vanc 12/30 >> Zosyn 12/30 >>  12/30 UCx -  12/30 BCx x2 -   Goal of Therapy:  Vancomycin trough level 15-20 mcg/ml   Plan:  - Vanc 2gm IV x 1, then 1750mg  IV Q48H - Zosyn 3.375gm IV Q8H, 4 hr infusion (d/c one time Rocephin order) - Monitor renal fxn closely, micro data, vanc trough at Css    Arma Reining D. Mina Marble, PharmD, BCPS Pager:  [phone] 12/06/2014, 7:53 AM

## 2014-12-06 NOTE — Progress Notes (Signed)
Patient Demographics  [name] [name], is a 68 y.o. male, DOB - [birth year], SAY:[id number]  Admit date - 12/04/2014   Admitting Physician Troy Sine, MD  Outpatient Primary MD for the patient is [name], MD  LOS - 2   Chief Complaint  Patient presents with  . Shortness of Breath      Admission history of present illness/brief narrative: 68 year-old gentleman presenting to the Emergency Dept today with multiple complaints. These include chest pain, shortness of breath, and RUQ pain. He has been previously scheduled for elective cardiac catheterization 12/29 to evaluate severe aortic insufficiency and declining LV function. However, the patient came to the emergency department today because he has noticed an acute change in his symptoms over the last 24 hours. He complains of aching all over. He also has had some nausea and vomiting. He complains of pain in his upper abdominal area. He has also had chest pain. His breathing has been worse over the last 2-3 days. He does feel that his "retaining more fluid." He admits to dietary indiscretion with salt. He has been taking his medications. Right upper quadrant ultrasound was done and there concern about acalculous cholecystitis general surgery recommended HIDA scan.  Subjective:   Govani Honeyman fever, rapid heart rate  Assessment & Plan    Principal Problem:   Sepsis Active Problems:   Obese   Hypertension   Sleep apnea   Panhypopituitarism   Moderate aortic regurgitation   Diabetes   CKD (chronic kidney disease), stage IV   Acute on chronic combined systolic and diastolic CHF (congestive heart failure)   SOB (shortness of breath)  Chest pain: - Resolved, most likely related to right upper quadrant pain, cardiac enzymes plateaued, usually on board. - Cardiac consult  Abdominal pain, nausea,  vomiting, -Questionable acalculous cholecystitis on ultrasound, HIDA negative  Fever -BC x2 -vanc/zosyn -CT scan abd- tender in RUQ  Acute on chronic systolic/diastolic heart failure,  -LVEF 40-45 percent with diffuse hypokinesis, diastolic dysfunction, moderate to severe aortic regurgitation, severe pulmonary hypertension by echocardiogram 12/10. TEE and heart catheter suggested at that time for w/u for aortic regurgitation. - Evidence of volume overload but no evidence of pulmonary compromise. Further management and diuresis per cardiology. -On IV Lasix 80 mg twice a day. -Will need right and left heart cath and likely need aortic valve replacement in the setting of severe aortic insufficiency as per cardiology  Panhypopituitarism -Status post pituitary excision, continue with desmopressin, Decadron, Synthroid. -May need stress dose of steroid if going for surgery.  Seizure disorder -Status post craniectomy, continue with phenytoin.  Diabetes mellitus -Acceptable, continue with insulin sliding scale.  COPD -No active wheezing, on 2 L at baseline, appears stable  chronic renal failure-stage IV -Appears to be at baseline, continue to monitor as on aggressive IV diuresis.  Code Status: Full  Family Communication: None at bedside  Disposition Plan: Remains on telemetry- may need SDU   Procedures  None   Consults   Cardiology Surgery   Medications  Scheduled Meds: . antiseptic oral rinse  7 mL Mouth Rinse BID  . aspirin EC  325 mg Oral Daily  . atorvastatin  40 mg Oral q1800  . budesonide-formoterol  2 puff Inhalation BID  .  cloNIDine  0.2 mg Oral TID  . desmopressin  0.2 mg Oral BID  . dexamethasone  0.5 mg Oral Daily  . furosemide  80 mg Intravenous TID  . heparin  5,000 Units Subcutaneous 3 times per day  . insulin aspart  0-9 Units Subcutaneous TID WC  . ipratropium  0.5 mg Nebulization QID  . levothyroxine  175 mcg Oral QAC breakfast  . phenytoin  200 mg  Oral Once per day on Mon Thu  . phenytoin  300 mg Oral Once per day on Sun Tue Wed Fri Sat  . piperacillin-tazobactam (ZOSYN)  IV  3.375 g Intravenous Q8H  . sodium chloride  3 mL Intravenous Q12H  . [START ON 12/08/2014] vancomycin  1,750 mg Intravenous Q48H  . vancomycin  2,000 mg Intravenous Once   Continuous Infusions:  PRN Meds:.sodium chloride, acetaminophen, acetaminophen, glucagon (human recombinant), ondansetron (ZOFRAN) IV, sodium chloride  DVT Prophylaxis   Heparin -  Lab Results  Component Value Date   PLT 183 12/06/2014    Antibiotics    Anti-infectives    Start     Dose/Rate Route Frequency Ordered Stop   12/08/14 0900  vancomycin (VANCOCIN) 1,750 mg in sodium chloride 0.9 % 500 mL IVPB     1,750 mg250 mL/hr over 120 Minutes Intravenous Every 48 hours 12/06/14 0753     12/06/14 1500  piperacillin-tazobactam (ZOSYN) IVPB 3.375 g     3.375 g12.5 mL/hr over 240 Minutes Intravenous Every 8 hours 12/06/14 0748     12/06/14 0800  piperacillin-tazobactam (ZOSYN) IVPB 3.375 g     3.375 g100 mL/hr over 30 Minutes Intravenous  Once 12/06/14 0747 12/06/14 0921   12/06/14 0800  vancomycin (VANCOCIN) 2,000 mg in sodium chloride 0.9 % 500 mL IVPB     2,000 mg250 mL/hr over 120 Minutes Intravenous  Once 12/06/14 0747     12/06/14 0700  cefTRIAXone (ROCEPHIN) 1 g in dextrose 5 % 50 mL IVPB - Premix  Status:  Discontinued     1 g100 mL/hr over 30 Minutes Intravenous  Once 12/06/14 0658 12/06/14 0748   12/04/14 1830  cefTRIAXone (ROCEPHIN) 1 g in dextrose 5 % 50 mL IVPB - Premix  Status:  Discontinued     1 g100 mL/hr over 30 Minutes Intravenous Every 24 hours 12/04/14 1727 12/05/14 1717          Objective:   Filed Vitals:   12/06/14 0650 12/06/14 0700 12/06/14 0730 12/06/14 1007  BP:  182/69 182/60 136/55  Pulse:  102 95   Temp: 101.7 F (38.7 C)   99.4 F (37.4 C)  TempSrc: Rectal   Oral  Resp:  24 30   Height:      Weight:      SpO2:  94% 94%     Wt Readings from  Last 3 Encounters:  12/06/14 124.422 kg (274 lb 4.8 oz)  11/16/14 127.007 kg (280 lb)  06/12/14 121.201 kg (267 lb 3.2 oz)     Intake/Output Summary (Last 24 hours) at 12/06/14 1057 Last data filed at 12/06/14 0447  Gross per 24 hour  Intake    360 ml  Output   1550 ml  Net  -1190 ml     Physical Exam  Awake Alert, Oriented X 3, No new F.N deficits, Normal affect [place].AT,PERRAL Supple Neck,No JVD, No cervical lymphadenopathy appriciated.  Symmetrical Chest wall movement, Good air movement bilaterally, CTAB RRR,No Gallops,Rubs or new Murmurs, No Parasternal Heave +ve B.Sounds, Abd Soft, right upper quadrant  tenderness, No organomegaly appriciated, No rebound - guarding or rigidity. No Cyanosis, Clubbing ,+2  edema, No new Rash or bruise    Data Review   Micro Results Recent Results (from the past 240 hour(s))  MRSA PCR Screening     Status: None   Collection Time: 12/04/14  5:44 PM  Result Value Ref Range Status   MRSA by PCR NEGATIVE NEGATIVE Final    Comment:        The GeneXpert MRSA Assay (FDA approved for NASAL specimens only), is one component of a comprehensive MRSA colonization surveillance program. It is not intended to diagnose MRSA infection nor to guide or monitor treatment for MRSA infections.     Radiology Reports Nm Hepato W/eject Fract  12/05/2014   CLINICAL DATA:  68 year old with nausea and vomiting  EXAM: NUCLEAR MEDICINE HEPATOBILIARY IMAGING WITH GALLBLADDER EF  TECHNIQUE: Sequential images of the abdomen were obtained out to 60 minutes following intravenous administration of radiopharmaceutical. After slow intravenous infusion of 2.51 mcg micrograms Cholecystokinin, gallbladder ejection fraction was determined.  RADIOPHARMACEUTICALS:  5.0 [place] KG-40N Choletec  COMPARISON:  None.  FINDINGS: There is normal excretion radiotracer into the gallbladder and small bowel.  At 30 min, normal ejection fraction is greater than 30% (actually ejection  fraction was 60%).  The patient did not experience symptoms during CCK infusion.  IMPRESSION: No evidence of acute cholecystitis.   Electronically Signed   By: Rosemarie Ax   On: 12/05/2014 15:37   US Abdomen Limited Ruq  12/04/2014   CLINICAL DATA:  Abdominal pain for 2 days.  Prior right nephrectomy.  EXAM: US ABDOMEN LIMITED - RIGHT UPPER QUADRANT  COMPARISON:  08/08/2014 CT abdomen and pelvis  FINDINGS: Gallbladder:  Hydropic gallbladder measuring approximately 6 cm in diameter and 12 cm in length. There may be a small amount of pericholecystic fluid. Gallbladder wall is not thickened, measuring 2 mm. There is report of a positive sonographic Murphy's sign, although the patient complained of pain over the gallbladder and pancreas.  Common bile duct:  Diameter: 4.5 mm  Liver:  Mildly enlarged, with a length of 17.2 cm. No focal lesion identified. Within normal limits in parenchymal echogenicity.  Limited imaging of the pancreas demonstrated a diffusely heterogeneous and hypoechoic echotexture. Small right pleural effusion was noted.  IMPRESSION: 1. Hydropic gallbladder. No gallstones or gallbladder wall thickening, however possible positive sonographic Murphy's sign and trace pericholecystic fluid are present and acalculous cholecystitis is not excluded. Nuclear medicine HIDA scan could be performed to evaluate for cystic duct obstruction as clinically indicated. 2. No biliary dilatation. 3. Heterogeneous, hypoechoic pancreas which may reflect pancreatitis. Correlation with serum lipase level recommended. 4. Small right pleural effusion.   Electronically Signed   By: Logan Bores   On: 12/04/2014 12:02    CBC  Recent Labs Lab 12/04/14 0959 12/04/14 1809 12/06/14 0319  WBC 7.2 6.3 6.1  HGB 12.0* 11.7* 11.4*  HCT 39.4 37.9* 38.2*  PLT 158 177 183  MCV 89.5 87.5 88.4  MCH 27.3 27.0 26.4  MCHC 30.5 30.9 29.8*  RDW 17.2* 17.2* 17.3*  LYMPHSABS 1.2  --   --   MONOABS 0.8  --   --   EOSABS  0.8*  --   --   BASOSABS 0.1  --   --     Chemistries   Recent Labs Lab 12/04/14 0959 12/04/14 1809 12/05/14 1351 12/06/14 0319  NA 139  --  140 138  K 4.7  --  4.2  4.7  CL 104  --  105 103  CO2 29  --  26 26  GLUCOSE 108*  --  107* 90  BUN 53*  --  55* 54*  CREATININE 2.37* 2.40* 2.66* 2.69*  CALCIUM 8.5  --  8.8 8.7  AST 18  --   --  17  ALT 15  --   --  12  ALKPHOS 68  --   --  55  BILITOT 0.5  --   --  0.8   ------------------------------------------------------------------------------------------------------------------ estimated creatinine clearance is 35.3 mL/min (by C-G formula based on Cr of 2.69). ------------------------------------------------------------------------------------------------------------------ No results for input(s): HGBA1C in the last 72 hours. ------------------------------------------------------------------------------------------------------------------ No results for input(s): CHOL, HDL, LDLCALC, TRIG, CHOLHDL, LDLDIRECT in the last 72 hours. ------------------------------------------------------------------------------------------------------------------ No results for input(s): TSH, T4TOTAL, T3FREE, THYROIDAB in the last 72 hours.  Invalid input(s): FREET3 ------------------------------------------------------------------------------------------------------------------ No results for input(s): VITAMINB12, FOLATE, FERRITIN, TIBC, IRON, RETICCTPCT in the last 72 hours.  Coagulation profile No results for input(s): INR, PROTIME in the last 168 hours.  No results for input(s): DDIMER in the last 72 hours.  Cardiac Enzymes  Recent Labs Lab 12/04/14 1809 12/04/14 2312 12/05/14 0550  TROPONINI 0.04* 0.03 0.03   ------------------------------------------------------------------------------------------------------------------ Invalid input(s): POCBNP     Time Spent in minutes   35 minutes   VANN, JESSICA DO on 12/06/2014 at 10:57  AM  Between 7am to 7pm - Pager - [phone]  After 7pm go to [email or web address] - password [email or web address]  And look for the night coverage person covering for me after hours  [facility] Office  [phone]

## 2014-12-07 LAB — BASIC METABOLIC PANEL
Anion gap: 8 (ref 5–15)
BUN: 56 mg/dL — ABNORMAL HIGH (ref 6–23)
CO2: 23 mmol/L (ref 19–32)
Calcium: 8.2 mg/dL — ABNORMAL LOW (ref 8.4–10.5)
Chloride: 103 mEq/L (ref 96–112)
Creatinine, Ser: 3.04 mg/dL — ABNORMAL HIGH (ref 0.50–1.35)
GFR calc Af Amer: 22 mL/min — ABNORMAL LOW (ref >90)
GFR, EST NON AFRICAN AMERICAN: 19 mL/min — AB (ref >90)
Glucose, Bld: 95 mg/dL (ref 70–99)
Potassium: 4.2 mmol/L (ref 3.5–5.1)
Sodium: 134 mmol/L — ABNORMAL LOW (ref 135–145)

## 2014-12-07 LAB — GLUCOSE, CAPILLARY
Glucose-Capillary: 93 mg/dL (ref 70–99)
Glucose-Capillary: 224 mg/dL — ABNORMAL HIGH (ref 70–99)
Glucose-Capillary: 105 mg/dL — ABNORMAL HIGH (ref 70–99)
Glucose-Capillary: 150 mg/dL — ABNORMAL HIGH (ref 70–99)

## 2014-12-07 LAB — CLOSTRIDIUM DIFFICILE BY PCR: Toxigenic C. Difficile by PCR: NEGATIVE

## 2014-12-07 MED ORDER — ALBUTEROL SULFATE (2.5 MG/3ML) 0.083% IN NEBU
2.5000 mg | INHALATION_SOLUTION | RESPIRATORY_TRACT | Status: DC | PRN
  Administered 2014-12-07 – 2014-12-13 (×3): 2.5 mg via RESPIRATORY_TRACT
  Filled 2014-12-07 (×2): qty 3

## 2014-12-07 MED ORDER — FUROSEMIDE 10 MG/ML IJ SOLN
80.0000 mg | Freq: Two times a day (BID) | INTRAMUSCULAR | Status: DC
  Administered 2014-12-07 – 2014-12-10 (×6): 80 mg via INTRAVENOUS
  Filled 2014-12-07 (×6): qty 8

## 2014-12-07 NOTE — Progress Notes (Signed)
Patient Demographics  [name] [name], is a 68 y.o. male, DOB - 11-27-1946, YJE:[id number]  Admit date - 12/04/2014   Admitting Physician Troy Sine, MD  Outpatient Primary MD for the patient is [name], MD  LOS - 3   Chief Complaint  Patient presents with  . Shortness of Breath      Admission history of present illness/brief narrative: 68 year-old gentleman presenting to the Emergency Dept today with multiple complaints. These include chest pain, shortness of breath, and RUQ pain. He has been previously scheduled for elective cardiac catheterization 12/29 to evaluate severe aortic insufficiency and declining LV function. However, the patient came to the emergency department today because he has noticed an acute change in his symptoms over the last 24 hours. He complains of aching all over. He also has had some nausea and vomiting. He complains of pain in his upper abdominal area. He has also had chest pain. His breathing has been worse over the last 2-3 days. He does feel that his "retaining more fluid." He admits to dietary indiscretion with salt. He has been taking his medications. Right upper quadrant ultrasound was done and there concern about acalculous cholecystitis general surgery recommended HIDA scan which was negative  Subjective:   [name] [name] up in chair and feeling better  Assessment & Plan    Principal Problem:   Sepsis Active Problems:   Obese   Hypertension   Sleep apnea   Panhypopituitarism   Moderate aortic regurgitation   Diabetes   CKD (chronic kidney disease), stage IV   Acute on chronic combined systolic and diastolic CHF (congestive heart failure)   SOB (shortness of breath)   Fever   Pain in the chest   Pyrexia  Chest pain: - Resolved, most likely related to right upper quadrant pain, cardiac enzymes plateaued, usually on  board. - Cardiac consult- cath at some point- will need renal maximization may need renal consult  Abdominal pain, nausea, vomiting, -Questionable acalculous cholecystitis on ultrasound, HIDA negative  Fever -BC x2 -vanc/zosyn -CT scan abd show inflammation- tender in RUQ  Acute on chronic systolic/diastolic heart failure,  -LVEF 40-45 percent with diffuse hypokinesis, diastolic dysfunction, moderate to severe aortic regurgitation, severe pulmonary hypertension by echocardiogram 12/10. TEE and heart catheter suggested at that time for w/u for aortic regurgitation. - Evidence of volume overload but no evidence of pulmonary compromise. Further management and diuresis per cardiology. Lasix on hold -Will need right and left heart cath and likely need aortic valve replacement in the setting of severe aortic insufficiency as per cardiology  Panhypopituitarism -Status post pituitary excision, continue with desmopressin, Decadron, Synthroid. -May need stress dose of steroid if going for surgery.  Seizure disorder -Status post craniectomy, continue with phenytoin.  Diabetes mellitus -Acceptable, continue with insulin sliding scale.  COPD -No active wheezing, on 2 L at baseline, appears stable  chronic renal failure-stage IV -Appears to be at baseline, continue to monitor as on aggressive IV diuresis.  Code Status: Full  Family Communication: None at bedside  Disposition Plan: Remains on telemetry   Procedures  None   Consults   Cardiology Surgery   Medications  Scheduled Meds: . antiseptic oral rinse  7 mL Mouth Rinse BID  . aspirin  EC  325 mg Oral Daily  . atorvastatin  40 mg Oral q1800  . budesonide-formoterol  2 puff Inhalation BID  . cloNIDine  0.2 mg Oral TID  . desmopressin  0.2 mg Oral BID  . dexamethasone  0.5 mg Oral Daily  . heparin  5,000 Units Subcutaneous 3 times per day  . insulin aspart  0-9 Units Subcutaneous TID WC  . ipratropium  0.5 mg  Nebulization QID  . levothyroxine  175 mcg Oral QAC breakfast  . phenytoin  200 mg Oral Once per day on Mon Thu  . phenytoin  300 mg Oral Once per day on Sun Tue Wed Fri Sat  . piperacillin-tazobactam (ZOSYN)  IV  3.375 g Intravenous Q8H  . sodium chloride  3 mL Intravenous Q12H  . [START ON 12/08/2014] vancomycin  1,750 mg Intravenous Q48H   Continuous Infusions:  PRN Meds:.sodium chloride, acetaminophen, acetaminophen, albuterol, ondansetron (ZOFRAN) IV, sodium chloride  DVT Prophylaxis   Heparin -  Lab Results  Component Value Date   PLT 183 12/06/2014    Antibiotics    Anti-infectives    Start     Dose/Rate Route Frequency Ordered Stop   12/08/14 0900  vancomycin (VANCOCIN) 1,750 mg in sodium chloride 0.9 % 500 mL IVPB     1,750 mg250 mL/hr over 120 Minutes Intravenous Every 48 hours 12/06/14 0753     12/06/14 1500  piperacillin-tazobactam (ZOSYN) IVPB 3.375 g     3.375 g12.5 mL/hr over 240 Minutes Intravenous Every 8 hours 12/06/14 0748     12/06/14 0800  piperacillin-tazobactam (ZOSYN) IVPB 3.375 g     3.375 g100 mL/hr over 30 Minutes Intravenous  Once 12/06/14 0747 12/06/14 0921   12/06/14 0800  vancomycin (VANCOCIN) 2,000 mg in sodium chloride 0.9 % 500 mL IVPB     2,000 mg250 mL/hr over 120 Minutes Intravenous  Once 12/06/14 0747 12/06/14 1211   12/06/14 0700  cefTRIAXone (ROCEPHIN) 1 g in dextrose 5 % 50 mL IVPB - Premix  Status:  Discontinued     1 g100 mL/hr over 30 Minutes Intravenous  Once 12/06/14 0658 12/06/14 0748   12/04/14 1830  cefTRIAXone (ROCEPHIN) 1 g in dextrose 5 % 50 mL IVPB - Premix  Status:  Discontinued     1 g100 mL/hr over 30 Minutes Intravenous Every 24 hours 12/04/14 1727 12/05/14 1717          Objective:   Filed Vitals:   12/07/14 0700 12/07/14 0800 12/07/14 0813 12/07/14 1133  BP: 108/46 123/46    Pulse: 84 86    Temp: 97.8 F (36.6 C)     TempSrc: Oral     Resp: 19 20    Height:      Weight:      SpO2: 97% 98% 98% 99%    Wt  Readings from Last 3 Encounters:  12/07/14 125.51 kg (276 lb 11.2 oz)  11/16/14 127.007 kg (280 lb)  06/12/14 121.201 kg (267 lb 3.2 oz)     Intake/Output Summary (Last 24 hours) at 12/07/14 1238 Last data filed at 12/07/14 1229  Gross per 24 hour  Intake   1060 ml  Output   1280 ml  Net   -220 ml     Physical Exam  Awake Alert, Oriented X 3, No new F.N deficits, Normal affect Symmetrical Chest wall movement, Good air movement bilaterally, CTAB RRR,No Gallops,Rubs or new Murmurs, No Parasternal Heave +ve B.Sounds, Abd Soft, right upper quadrant tenderness, No organomegaly appriciated, No rebound -  guarding or rigidity. No Cyanosis, Clubbing ,+2  edema, No new Rash or bruise    Data Review   Micro Results Recent Results (from the past 240 hour(s))  MRSA PCR Screening     Status: None   Collection Time: 12/04/14  5:44 PM  Result Value Ref Range Status   MRSA by PCR NEGATIVE NEGATIVE Final    Comment:        The GeneXpert MRSA Assay (FDA approved for NASAL specimens only), is one component of a comprehensive MRSA colonization surveillance program. It is not intended to diagnose MRSA infection nor to guide or monitor treatment for MRSA infections.   Culture, blood (routine x 2)     Status: None (Preliminary result)   Collection Time: 12/06/14  8:45 AM  Result Value Ref Range Status   Specimen Description BLOOD LEFT ARM  Final   Special Requests BOTTLES DRAWN AEROBIC AND ANAEROBIC 10CC  Final   Culture   Final           BLOOD CULTURE RECEIVED NO GROWTH TO DATE CULTURE WILL BE HELD FOR 5 DAYS BEFORE ISSUING A FINAL NEGATIVE REPORT Note: Culture results may be compromised due to an inadequate volume of blood received in culture bottles. Performed at [facility]    Report Status PENDING  Incomplete  Culture, blood (routine x 2)     Status: None (Preliminary result)   Collection Time: 12/06/14  8:50 AM  Result Value Ref Range Status   Specimen Description  BLOOD RIGHT HAND  Final   Special Requests BOTTLES DRAWN AEROBIC ONLY 10CC  Final   Culture   Final           BLOOD CULTURE RECEIVED NO GROWTH TO DATE CULTURE WILL BE HELD FOR 5 DAYS BEFORE ISSUING A FINAL NEGATIVE REPORT Note: Culture results may be compromised due to an excessive volume of blood received in culture bottles. Performed at [facility]    Report Status PENDING  Incomplete  Clostridium Difficile by PCR     Status: None   Collection Time: 12/06/14 10:42 PM  Result Value Ref Range Status   C difficile by pcr NEGATIVE NEGATIVE Final    Radiology Reports Ct Abdomen Pelvis Wo Contrast  12/06/2014   CLINICAL DATA:  Right upper quadrant pain and fever with fluid retention that has worsened the last 3 days.  EXAM: CT ABDOMEN AND PELVIS WITHOUT CONTRAST  TECHNIQUE: Multidetector CT imaging of the abdomen and pelvis was performed following the standard protocol without IV contrast.  COMPARISON:  August 08, 2014  FINDINGS: The gallbladder is distended with indistinct borders suggesting possible inflammation. No gallstones are noted. There is minimal ascites in the bilateral pericolic gutter and in the upper pelvis.  The liver, spleen, pancreas and adrenal glands are normal. The patient is status post prior right nephrectomy. There are several small lesions in the left kidney some are high density in the medial lower pole unchanged compared to prior CT. Evaluation is limited without contrast. There is no small bowel obstruction or diverticulitis. There is a small hiatal hernia.  Partial decompressed bladder is unremarkable. There is no pelvic lymphadenopathy. There is a small right pleural effusion with adjacent compression atelectasis of the posterior right lung base. Degenerative joint changes of the spine are identified.  IMPRESSION: The gallbladder is distended with indistinct borders suggesting possible inflammation. No gallstones are noted. Differential possibility includes  acalculus cholecystitis.   Electronically Signed   By: Mallie Darting.D.  On: 12/06/2014 14:32   Nm Hepato W/eject Fract  12/05/2014   CLINICAL DATA:  68 year old with nausea and vomiting  EXAM: NUCLEAR MEDICINE HEPATOBILIARY IMAGING WITH GALLBLADDER EF  TECHNIQUE: Sequential images of the abdomen were obtained out to 60 minutes following intravenous administration of radiopharmaceutical. After slow intravenous infusion of 2.51 mcg micrograms Cholecystokinin, gallbladder ejection fraction was determined.  RADIOPHARMACEUTICALS:  5.0 Millicurie [id number] Choletec  COMPARISON:  None.  FINDINGS: There is normal excretion radiotracer into the gallbladder and small bowel.  At 30 min, normal ejection fraction is greater than 30% (actually ejection fraction was 60%).  The patient did not experience symptoms during CCK infusion.  IMPRESSION: No evidence of acute cholecystitis.   Electronically Signed   By: Rosemarie Ax   On: 12/05/2014 15:37   Dg Chest Port 1 View  12/06/2014   CLINICAL DATA:  New onset fever today. History of CHF, diabetes, hypertension, COPD, lower leg cellulitis, sleep apnea, kidney disease, and seizures.  EXAM: PORTABLE CHEST - 1 VIEW  COMPARISON:  12/04/2014  FINDINGS: Enlargement of the cardiac silhouette is unchanged. Prominence of the superior mediastinum is likely secondary to technique. Vascular congestion and interstitial opacities have slightly increased compared to the prior study. There may be small bilateral pleural effusions. No segmental airspace consolidation or pneumothorax is seen.  IMPRESSION: Slightly increased pulmonary edema.   Electronically Signed   By: Logan Bores   On: 12/06/2014 13:09    CBC  Recent Labs Lab 12/04/14 0959 12/04/14 1809 12/06/14 0319  WBC 7.2 6.3 6.1  HGB 12.0* 11.7* 11.4*  HCT 39.4 37.9* 38.2*  PLT 158 177 183  MCV 89.5 87.5 88.4  MCH 27.3 27.0 26.4  MCHC 30.5 30.9 29.8*  RDW 17.2* 17.2* 17.3*  LYMPHSABS 1.2  --   --   MONOABS 0.8   --   --   EOSABS 0.8*  --   --   BASOSABS 0.1  --   --     Chemistries   Recent Labs Lab 12/04/14 0959 12/04/14 1809 12/05/14 1351 12/06/14 0319 12/07/14 0420  NA 139  --  140 138 134*  K 4.7  --  4.2 4.7 4.2  CL 104  --  105 103 103  CO2 29  --  26 26 23   GLUCOSE 108*  --  107* 90 95  BUN 53*  --  55* 54* 56*  CREATININE 2.37* 2.40* 2.66* 2.69* 3.04*  CALCIUM 8.5  --  8.8 8.7 8.2*  AST 18  --   --  17  --   ALT 15  --   --  12  --   ALKPHOS 68  --   --  55  --   BILITOT 0.5  --   --  0.8  --    ------------------------------------------------------------------------------------------------------------------ estimated creatinine clearance is 31.4 mL/min (by C-G formula based on Cr of 3.04). ------------------------------------------------------------------------------------------------------------------ No results for input(s): HGBA1C in the last 72 hours. ------------------------------------------------------------------------------------------------------------------ No results for input(s): CHOL, HDL, LDLCALC, TRIG, CHOLHDL, LDLDIRECT in the last 72 hours. ------------------------------------------------------------------------------------------------------------------ No results for input(s): TSH, T4TOTAL, T3FREE, THYROIDAB in the last 72 hours.  Invalid input(s): FREET3 ------------------------------------------------------------------------------------------------------------------ No results for input(s): VITAMINB12, FOLATE, FERRITIN, TIBC, IRON, RETICCTPCT in the last 72 hours.  Coagulation profile No results for input(s): INR, PROTIME in the last 168 hours.  No results for input(s): DDIMER in the last 72 hours.  Cardiac Enzymes  Recent Labs Lab 12/04/14 1809 12/04/14 2312 12/05/14 0550  TROPONINI 0.04* 0.03 0.03   ------------------------------------------------------------------------------------------------------------------  Invalid input(s):  POCBNP     Time Spent in minutes   25 minutes   Ardine Iacovelli DO on 12/07/2014 at 12:38 PM  Between 7am to 7pm - Pager - [phone]  After 7pm go to [email or web address] - password [email or web address]  And look for the night coverage person covering for me after hours  [facility] Office  [phone]

## 2014-12-07 NOTE — Progress Notes (Signed)
Subjective:  SOB improved. 12/30 had resp distress. Fever.    Objective:  Vital Signs in the last 24 hours: Temp:  [97.5 F (36.4 C)-100.4 F (38 C)] 97.8 F (36.6 C) (12/31 0700) Pulse Rate:  [79-86] 86 (12/31 0800) Resp:  [16-22] 20 (12/31 0800) BP: (99-136)/(40-55) 123/46 mmHg (12/31 0800) SpO2:  [96 %-100 %] 98 % (12/31 0813) Weight:  [276 lb 11.2 oz (125.51 kg)] 276 lb 11.2 oz (125.51 kg) (12/31 0430)  Intake/Output from previous day: 12/30 0701 - 12/31 0700 In: 530 [P.O.:480; IV Piggyback:50] Out: 880 [Urine:880]   Physical Exam: General: Well developed, well nourished, in no acute distress. No further rigors Head:  Normocephalic and atraumatic. Lungs: Clear to auscultation and percussion. Heart: Normal S1 and S2. RRR grade 3/6 diastolic decrescendo murmur and grade 2/6 systolic ejection murmur at the left sternal border  Abdomen: soft, non-tender, positive bowel sounds. obese Extremities: No clubbing or cyanosis. 2+ pretibial edema with chronic stasis dermatitis noted bilaterally. Neurologic: Alert and oriented x 3.    Lab Results:  Recent Labs  12/04/14 1809 12/06/14 0319  WBC 6.3 6.1  HGB 11.7* 11.4*  PLT 177 183    Recent Labs  12/06/14 0319 12/07/14 0420  NA 138 134*  K 4.7 4.2  CL 103 103  CO2 26 23  GLUCOSE 90 95  BUN 54* 56*  CREATININE 2.69* 3.04*    Recent Labs  12/04/14 2312 12/05/14 0550  TROPONINI 0.03 0.03   Hepatic Function Panel  Recent Labs  12/06/14 0319  PROT 6.7  ALBUMIN 3.3*  AST 17  ALT 12  ALKPHOS 55  BILITOT 0.8  BILIDIR 0.2  IBILI 0.6    Imaging: Ct Abdomen Pelvis Wo Contrast  12/06/2014   CLINICAL DATA:  Right upper quadrant pain and fever with fluid retention that has worsened the last 3 days.  EXAM: CT ABDOMEN AND PELVIS WITHOUT CONTRAST  TECHNIQUE: Multidetector CT imaging of the abdomen and pelvis was performed following the standard protocol without IV contrast.  COMPARISON:  August 08, 2014   FINDINGS: The gallbladder is distended with indistinct borders suggesting possible inflammation. No gallstones are noted. There is minimal ascites in the bilateral pericolic gutter and in the upper pelvis.  The liver, spleen, pancreas and adrenal glands are normal. The patient is status post prior right nephrectomy. There are several small lesions in the left kidney some are high density in the medial lower pole unchanged compared to prior CT. Evaluation is limited without contrast. There is no small bowel obstruction or diverticulitis. There is a small hiatal hernia.  Partial decompressed bladder is unremarkable. There is no pelvic lymphadenopathy. There is a small right pleural effusion with adjacent compression atelectasis of the posterior right lung base. Degenerative joint changes of the spine are identified.  IMPRESSION: The gallbladder is distended with indistinct borders suggesting possible inflammation. No gallstones are noted. Differential possibility includes acalculus cholecystitis.   Electronically Signed   By: Abelardo Diesel M.D.   On: 12/06/2014 14:32   Nm Hepato W/eject Fract  12/05/2014   CLINICAL DATA:  68 year old with nausea and vomiting  EXAM: NUCLEAR MEDICINE HEPATOBILIARY IMAGING WITH GALLBLADDER EF  TECHNIQUE: Sequential images of the abdomen were obtained out to 60 minutes following intravenous administration of radiopharmaceutical. After slow intravenous infusion of 2.51 mcg micrograms Cholecystokinin, gallbladder ejection fraction was determined.  RADIOPHARMACEUTICALS:  5.0 Mill[employer]ie [id number] Choletec  COMPARISON:  None.  FINDINGS: There is normal excretion radiotracer into the gallbladder and small  bowel.  At 30 min, normal ejection fraction is greater than 30% (actually ejection fraction was 60%).  The patient did not experience symptoms during CCK infusion.  IMPRESSION: No evidence of acute cholecystitis.   Electronically Signed   By: Rosemarie Ax   On: 12/05/2014 15:37   Dg  Chest Port 1 View  12/06/2014   CLINICAL DATA:  New onset fever today. History of CHF, diabetes, hypertension, COPD, lower leg cellulitis, sleep apnea, kidney disease, and seizures.  EXAM: PORTABLE CHEST - 1 VIEW  COMPARISON:  12/04/2014  FINDINGS: Enlargement of the cardiac silhouette is unchanged. Prominence of the superior mediastinum is likely secondary to technique. Vascular congestion and interstitial opacities have slightly increased compared to the prior study. There may be small bilateral pleural effusions. No segmental airspace consolidation or pneumothorax is seen.  IMPRESSION: Slightly increased pulmonary edema.   Electronically Signed   By: Logan Bores   On: 12/06/2014 13:09    Assessment/Plan:  Principal Problem:   Sepsis Active Problems:   Obese   Hypertension   Sleep apnea   Panhypopituitarism   Moderate aortic regurgitation   Diabetes   CKD (chronic kidney disease), stage IV   Acute on chronic combined systolic and diastolic CHF (congestive heart failure)   SOB (shortness of breath)   Fever   Pain in the chest   Pyrexia  68 year old with infectious process, fever, CKD 4 with AKI, severe AI, acute systolic/diastolic HF EF 41-93%, obesity.  -HIDA negative -surgery signed off -With recent fever,  left and right heart cath on hold  -Agree with Dr. Burt Knack - high risk for renal failure with either cath or surgery to replace AV. Will need to discuss further with family.  -Hold lasix 80 IV TID. Creat increased from 2.6 to 3.  -Abx per primary team.  -Discussed with Dr. Eliseo Squires.  -At the earliest cath Monday.    SKAINS, [place] 12/07/2014, 8:55 AM
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
## 2014-12-08 ENCOUNTER — Inpatient Hospital Stay (HOSPITAL_COMMUNITY): Payer: Medicare HMO

## 2014-12-08 DIAGNOSIS — I429 Cardiomyopathy, unspecified: Secondary | ICD-10-CM

## 2014-12-08 DIAGNOSIS — R0600 Dyspnea, unspecified: Secondary | ICD-10-CM | POA: Insufficient documentation

## 2014-12-08 DIAGNOSIS — A419 Sepsis, unspecified organism: Principal | ICD-10-CM

## 2014-12-08 DIAGNOSIS — R1011 Right upper quadrant pain: Secondary | ICD-10-CM

## 2014-12-08 LAB — CBC
HCT: 36.5 % — ABNORMAL LOW (ref 39.0–52.0)
Hemoglobin: 11.3 g/dL — ABNORMAL LOW (ref 13.0–17.0)
MCH: 26.8 pg (ref 26.0–34.0)
MCHC: 31 g/dL (ref 30.0–36.0)
MCV: 86.7 fL (ref 78.0–100.0)
PLATELETS: 179 10*3/uL (ref 150–400)
RBC: 4.21 MIL/uL — AB (ref 4.22–5.81)
RDW: 17.1 % — ABNORMAL HIGH (ref 11.5–15.5)
WBC: 7.1 10*3/uL (ref 4.0–10.5)

## 2014-12-08 LAB — RENAL FUNCTION PANEL
ALBUMIN: 3.1 g/dL — AB (ref 3.5–5.2)
ANION GAP: 19 — AB (ref 5–15)
BUN: 60 mg/dL — ABNORMAL HIGH (ref 6–23)
CALCIUM: 8.5 mg/dL (ref 8.4–10.5)
CHLORIDE: 104 meq/L (ref 96–112)
CO2: 13 mmol/L — ABNORMAL LOW (ref 19–32)
CREATININE: 2.97 mg/dL — AB (ref 0.50–1.35)
GFR, EST AFRICAN AMERICAN: 23 mL/min — AB (ref >90)
GFR, EST NON AFRICAN AMERICAN: 20 mL/min — AB (ref >90)
Glucose, Bld: 103 mg/dL — ABNORMAL HIGH (ref 70–99)
PHOSPHORUS: 3.8 mg/dL (ref 2.3–4.6)
Potassium: 4.4 mmol/L (ref 3.5–5.1)
Sodium: 136 mmol/L (ref 135–145)

## 2014-12-08 LAB — GLUCOSE, CAPILLARY
GLUCOSE-CAPILLARY: 80 mg/dL (ref 70–99)
Glucose-Capillary: 87 mg/dL (ref 70–99)
Glucose-Capillary: 118 mg/dL — ABNORMAL HIGH (ref 70–99)
Glucose-Capillary: 93 mg/dL (ref 70–99)
Glucose-Capillary: 86 mg/dL (ref 70–99)

## 2014-12-08 MED ORDER — TIOTROPIUM BROMIDE MONOHYDRATE 18 MCG IN CAPS
18.0000 ug | ORAL_CAPSULE | Freq: Every day | RESPIRATORY_TRACT | Status: DC
  Administered 2014-12-09 – 2014-12-19 (×10): 18 ug via RESPIRATORY_TRACT
  Filled 2014-12-08 (×2): qty 5

## 2014-12-08 NOTE — Progress Notes (Signed)
Patient Demographics  [name] [name], is a 69 y.o. male, DOB - 04/28/1946, ELF:[id number]  Admit date - 12/04/2014   Admitting Physician Troy Sine, MD  Outpatient Primary MD for the patient is [name], MD  LOS - 4   Chief Complaint  Patient presents with  . Shortness of Breath      Admission history of present illness/brief narrative: 69 year-old gentleman presenting to the Emergency Dept today with multiple complaints. These include chest pain, shortness of breath, and RUQ pain. He has been previously scheduled for elective cardiac catheterization 12/29 to evaluate severe aortic insufficiency and declining LV function. However, the patient came to the emergency department today because he has noticed an acute change in his symptoms over the last 24 hours. He complains of aching all over. He also has had some nausea and vomiting. He complains of pain in his upper abdominal area. He has also had chest pain. His breathing has been worse over the last 2-3 days. He does feel that his "retaining more fluid." He admits to dietary indiscretion with salt. He has been taking his medications. Right upper quadrant ultrasound was done and there concern about acalculous cholecystitis general surgery recommended HIDA scan which was negative  Subjective:   [name] [name]  Tired after getting a bath   Assessment & Plan    Principal Problem:   Sepsis Active Problems:   Obese   Hypertension   Sleep apnea   Panhypopituitarism   Moderate aortic regurgitation   Diabetes   CKD (chronic kidney disease), stage IV   Acute on chronic combined systolic and diastolic CHF (congestive heart failure)   SOB (shortness of breath)   Fever   Pain in the chest   Pyrexia   Chest pain: - Resolved, most likely related to right upper quadrant pain, cardiac enzymes plateaued, usually on  board. - Cardiac consult- cath at some point- will need renal maximization may need renal consult  Abdominal pain, nausea, vomiting, -Questionable acalculous cholecystitis on ultrasound, HIDA negative  Fever -BC x2 -vanc/zosyn- transition over to PO -CT scan abd show inflammation- tender in RUQ is gone after ABX  Acute on chronic systolic/diastolic heart failure,  -LVEF 40-45 percent with diffuse hypokinesis, diastolic dysfunction, moderate to severe aortic regurgitation, severe pulmonary hypertension by echocardiogram 12/10. TEE and heart catheter suggested at that time for w/u for aortic regurgitation. - Evidence of volume overload but no evidence of pulmonary compromise. Further management and diuresis per cardiology. Lasix on hold -Will need right and left heart cath and likely need aortic valve replacement in the setting of severe aortic insufficiency as per cardiology  Panhypopituitarism -Status post pituitary excision, continue with desmopressin, Decadron, Synthroid. -May need stress dose of steroid if going for surgery.  Seizure disorder -Status post craniectomy, continue with phenytoin.  Diabetes mellitus -Acceptable, continue with insulin sliding scale.  COPD -No active wheezing, on 2 L at baseline, appears stable  chronic renal failure-stage IV -Appears to be at baseline, continue to monitor  -appreciate renal   Code Status: Full  Family Communication: None at bedside  Disposition Plan: Remains on telemetry   Procedures  None   Consults   Cardiology Surgery   Medications  Scheduled Meds: . antiseptic oral  rinse  7 mL Mouth Rinse BID  . aspirin EC  325 mg Oral Daily  . atorvastatin  40 mg Oral q1800  . budesonide-formoterol  2 puff Inhalation BID  . cloNIDine  0.2 mg Oral TID  . desmopressin  0.2 mg Oral BID  . dexamethasone  0.5 mg Oral Daily  . furosemide  80 mg Intravenous BID  . heparin  5,000 Units Subcutaneous 3 times per day  . insulin  aspart  0-9 Units Subcutaneous TID WC  . ipratropium  0.5 mg Nebulization QID  . levothyroxine  175 mcg Oral QAC breakfast  . phenytoin  200 mg Oral Once per day on Mon Thu  . phenytoin  300 mg Oral Once per day on Sun Tue Wed Fri Sat  . piperacillin-tazobactam (ZOSYN)  IV  3.375 g Intravenous Q8H  . sodium chloride  3 mL Intravenous Q12H  . vancomycin  1,750 mg Intravenous Q48H   Continuous Infusions:  PRN Meds:.sodium chloride, acetaminophen, acetaminophen, albuterol, ondansetron (ZOFRAN) IV, sodium chloride  DVT Prophylaxis   Heparin -  Lab Results  Component Value Date   PLT 179 12/08/2014    Antibiotics    Anti-infectives    Start     Dose/Rate Route Frequency Ordered Stop   12/08/14 0900  vancomycin (VANCOCIN) 1,750 mg in sodium chloride 0.9 % 500 mL IVPB     1,750 mg250 mL/hr over 120 Minutes Intravenous Every 48 hours 12/06/14 0753     12/06/14 1500  piperacillin-tazobactam (ZOSYN) IVPB 3.375 g     3.375 g12.5 mL/hr over 240 Minutes Intravenous Every 8 hours 12/06/14 0748     12/06/14 0800  piperacillin-tazobactam (ZOSYN) IVPB 3.375 g     3.375 g100 mL/hr over 30 Minutes Intravenous  Once 12/06/14 0747 12/06/14 0921   12/06/14 0800  vancomycin (VANCOCIN) 2,000 mg in sodium chloride 0.9 % 500 mL IVPB     2,000 mg250 mL/hr over 120 Minutes Intravenous  Once 12/06/14 0747 12/06/14 1211   12/06/14 0700  cefTRIAXone (ROCEPHIN) 1 g in dextrose 5 % 50 mL IVPB - Premix  Status:  Discontinued     1 g100 mL/hr over 30 Minutes Intravenous  Once 12/06/14 0658 12/06/14 0748   12/04/14 1830  cefTRIAXone (ROCEPHIN) 1 g in dextrose 5 % 50 mL IVPB - Premix  Status:  Discontinued     1 g100 mL/hr over 30 Minutes Intravenous Every 24 hours 12/04/14 1727 12/05/14 1717          Objective:   Filed Vitals:   12/07/14 2108 12/08/14 0529 12/08/14 0852 12/08/14 0917  BP:  144/52  126/52  Pulse:  84    Temp:  97.8 F (36.6 C)    TempSrc:  Oral    Resp:  12    Height:      Weight:   123.378 kg (272 lb)    SpO2: 97% 100% 98%     Wt Readings from Last 3 Encounters:  12/08/14 123.378 kg (272 lb)  11/16/14 127.007 kg (280 lb)  06/12/14 121.201 kg (267 lb 3.2 oz)     Intake/Output Summary (Last 24 hours) at 12/08/14 1031 Last data filed at 12/08/14 1000  Gross per 24 hour  Intake    120 ml  Output   2300 ml  Net  -2180 ml     Physical Exam  Awake Alert, Oriented X 3, No new F.N deficits, Normal affect Symmetrical Chest wall movement, Good air movement bilaterally, CTAB RRR,No Gallops,Rubs or new Murmurs,  No Parasternal Heave +ve B.Sounds, Abd Soft, right upper quadrant tenderness, No organomegaly appriciated, No rebound - guarding or rigidity. No Cyanosis, Clubbing ,+2  edema, No new Rash or bruise    Data Review   Micro Results Recent Results (from the past 240 hour(s))  MRSA PCR Screening     Status: None   Collection Time: 12/04/14  5:44 PM  Result Value Ref Range Status   MRSA by PCR NEGATIVE NEGATIVE Final    Comment:        The GeneXpert MRSA Assay (FDA approved for NASAL specimens only), is one component of a comprehensive MRSA colonization surveillance program. It is not intended to diagnose MRSA infection nor to guide or monitor treatment for MRSA infections.   Culture, blood (routine x 2)     Status: None (Preliminary result)   Collection Time: 12/06/14  8:45 AM  Result Value Ref Range Status   Specimen Description BLOOD LEFT ARM  Final   Special Requests BOTTLES DRAWN AEROBIC AND ANAEROBIC 10CC  Final   Culture   Final           BLOOD CULTURE RECEIVED NO GROWTH TO DATE CULTURE WILL BE HELD FOR 5 DAYS BEFORE ISSUING A FINAL NEGATIVE REPORT Note: Culture results may be compromised due to an inadequate volume of blood received in culture bottles. Performed at [facility]    Report Status PENDING  Incomplete  Culture, blood (routine x 2)     Status: None (Preliminary result)   Collection Time: 12/06/14  8:50 AM  Result  Value Ref Range Status   Specimen Description BLOOD RIGHT HAND  Final   Special Requests BOTTLES DRAWN AEROBIC ONLY 10CC  Final   Culture   Final           BLOOD CULTURE RECEIVED NO GROWTH TO DATE CULTURE WILL BE HELD FOR 5 DAYS BEFORE ISSUING A FINAL NEGATIVE REPORT Note: Culture results may be compromised due to an excessive volume of blood received in culture bottles. Performed at [facility]    Report Status PENDING  Incomplete  Clostridium Difficile by PCR     Status: None   Collection Time: 12/06/14 10:42 PM  Result Value Ref Range Status   C difficile by pcr NEGATIVE NEGATIVE Final    Radiology Reports Ct Abdomen Pelvis Wo Contrast  12/06/2014   CLINICAL DATA:  Right upper quadrant pain and fever with fluid retention that has worsened the last 3 days.  EXAM: CT ABDOMEN AND PELVIS WITHOUT CONTRAST  TECHNIQUE: Multidetector CT imaging of the abdomen and pelvis was performed following the standard protocol without IV contrast.  COMPARISON:  August 08, 2014  FINDINGS: The gallbladder is distended with indistinct borders suggesting possible inflammation. No gallstones are noted. There is minimal ascites in the bilateral pericolic gutter and in the upper pelvis.  The liver, spleen, pancreas and adrenal glands are normal. The patient is status post prior right nephrectomy. There are several small lesions in the left kidney some are high density in the medial lower pole unchanged compared to prior CT. Evaluation is limited without contrast. There is no small bowel obstruction or diverticulitis. There is a small hiatal hernia.  Partial decompressed bladder is unremarkable. There is no pelvic lymphadenopathy. There is a small right pleural effusion with adjacent compression atelectasis of the posterior right lung base. Degenerative joint changes of the spine are identified.  IMPRESSION: The gallbladder is distended with indistinct borders suggesting possible inflammation. No gallstones are  noted.  Differential possibility includes acalculus cholecystitis.   Electronically Signed   By: Abelardo Diesel M.D.   On: 12/06/2014 14:32   Dg Chest Port 1 View  12/06/2014   CLINICAL DATA:  New onset fever today. History of CHF, diabetes, hypertension, COPD, lower leg cellulitis, sleep apnea, kidney disease, and seizures.  EXAM: PORTABLE CHEST - 1 VIEW  COMPARISON:  12/04/2014  FINDINGS: Enlargement of the cardiac silhouette is unchanged. Prominence of the superior mediastinum is likely secondary to technique. Vascular congestion and interstitial opacities have slightly increased compared to the prior study. There may be small bilateral pleural effusions. No segmental airspace consolidation or pneumothorax is seen.  IMPRESSION: Slightly increased pulmonary edema.   Electronically Signed   By: Logan Bores   On: 12/06/2014 13:09    CBC  Recent Labs Lab 12/04/14 0959 12/04/14 1809 12/06/14 0319 12/08/14 0455  WBC 7.2 6.3 6.1 7.1  HGB 12.0* 11.7* 11.4* 11.3*  HCT 39.4 37.9* 38.2* 36.5*  PLT 158 177 183 179  MCV 89.5 87.5 88.4 86.7  MCH 27.3 27.0 26.4 26.8  MCHC 30.5 30.9 29.8* 31.0  RDW 17.2* 17.2* 17.3* 17.1*  LYMPHSABS 1.2  --   --   --   MONOABS 0.8  --   --   --   EOSABS 0.8*  --   --   --   BASOSABS 0.1  --   --   --     Chemistries   Recent Labs Lab 12/04/14 0959 12/04/14 1809 12/05/14 1351 12/06/14 0319 12/07/14 0420 12/08/14 0455  [name] 139  --  140 138 134* 136  K 4.7  --  4.2 4.7 4.2 4.4  CL 104  --  105 103 103 104  CO2 29  --  26 26 23  13*  GLUCOSE 108*  --  107* 90 95 103*  BUN 53*  --  55* 54* 56* 60*  CREATININE 2.37* 2.40* 2.66* 2.69* 3.04* 2.97*  CALCIUM 8.5  --  8.8 8.7 8.2* 8.5  AST 18  --   --  17  --   --   ALT 15  --   --  12  --   --   ALKPHOS 68  --   --  55  --   --   BILITOT 0.5  --   --  0.8  --   --    ------------------------------------------------------------------------------------------------------------------ estimated creatinine  clearance is 31.9 mL/min (by C-G formula based on Cr of 2.97). ------------------------------------------------------------------------------------------------------------------ No results for input(s): HGBA1C in the last 72 hours. ------------------------------------------------------------------------------------------------------------------ No results for input(s): CHOL, HDL, LDLCALC, TRIG, CHOLHDL, LDLDIRECT in the last 72 hours. ------------------------------------------------------------------------------------------------------------------ No results for input(s): TSH, T4TOTAL, T3FREE, THYROIDAB in the last 72 hours.  Invalid input(s): FREET3 ------------------------------------------------------------------------------------------------------------------ No results for input(s): VITAMINB12, FOLATE, FERRITIN, TIBC, IRON, RETICCTPCT in the last 72 hours.  Coagulation profile No results for input(s): INR, PROTIME in the last 168 hours.  No results for input(s): DDIMER in the last 72 hours.  Cardiac Enzymes  Recent Labs Lab 12/04/14 1809 12/04/14 2312 12/05/14 0550  TROPONINI 0.04* 0.03 0.03   ------------------------------------------------------------------------------------------------------------------ Invalid input(s): POCBNP     Time Spent in minutes   25 minutes   Casen Pryor DO on 12/08/2014 at 10:31 AM  Between 7am to 7pm - Pager - [phone]  After 7pm go to [email or web address] - password [email or web address]  And look for the night coverage person covering for me after hours  [facility] Office  [phone]

## 2014-12-08 NOTE — Progress Notes (Signed)
SUBJECTIVE: Sitting up in chair comfortably. Recognized me as his cardiologist in [place]. Believes breathing has improved since yesterday. Denies chills. We discussed his echo findings and subsequent management plan with respect to cardiac catheterization.     Intake/Output Summary (Last 24 hours) at 12/08/14 0932 Last data filed at 12/08/14 1448  Gross per 24 hour  Intake    120 ml  Output   2000 ml  Net  -1880 ml    Current Facility-Administered Medications  Medication Dose Route Frequency Provider Last Rate Last Dose  . 0.9 %  sodium chloride infusion  250 mL Intravenous PRN Samuella Cota, MD      . acetaminophen (TYLENOL) suppository 650 mg  650 mg Rectal Q4H PRN Erlene Quan, PA-C   650 mg at 12/06/14 1244  . acetaminophen (TYLENOL) tablet 650 mg  650 mg Oral Q4H PRN Samuella Cota, MD   650 mg at 12/08/14 0220  . albuterol (PROVENTIL) (2.5 MG/3ML) 0.083% nebulizer solution 2.5 mg  2.5 mg Nebulization Q4H PRN Geradine Girt, DO   2.5 mg at 12/07/14 0234  . antiseptic oral rinse (CPC / CETYLPYRIDINIUM CHLORIDE 0.05%) solution 7 mL  7 mL Mouth Rinse BID Phillips Climes, MD   7 mL at 12/08/14 0921  . aspirin EC tablet 325 mg  325 mg Oral Daily Samuella Cota, MD   325 mg at 12/08/14 0919  . atorvastatin (LIPITOR) tablet 40 mg  40 mg Oral q1800 Samuella Cota, MD   40 mg at 12/07/14 1701  . budesonide-formoterol (SYMBICORT) 160-4.5 MCG/ACT inhaler 2 puff  2 puff Inhalation BID Samuella Cota, MD   2 puff at 12/08/14 [phone]  . cloNIDine (CATAPRES) tablet 0.2 mg  0.2 mg Oral TID Samuella Cota, MD   0.2 mg at 12/08/14 0917  . desmopressin (DDAVP) tablet 0.2 mg  0.2 mg Oral BID Samuella Cota, MD   0.2 mg at 12/07/14 2230  . dexamethasone (DECADRON) tablet 0.5 mg  0.5 mg Oral Daily Samuella Cota, MD   0.5 mg at 12/07/14 0948  . furosemide (LASIX) injection 80 mg  80 mg Intravenous BID Donato Heinz, MD   80 mg at 12/08/14 0913  . heparin  injection 5,000 Units  5,000 Units Subcutaneous 3 times per day Samuella Cota, MD   5,000 Units at 12/08/14 0645  . insulin aspart (novoLOG) injection 0-9 Units  0-9 Units Subcutaneous TID WC Samuella Cota, MD   3 Units at 12/07/14 1225  . ipratropium (ATROVENT) nebulizer solution 0.5 mg  0.5 mg Nebulization QID Samuella Cota, MD   0.5 mg at 12/08/14 0850  . levothyroxine (SYNTHROID, LEVOTHROID) tablet 175 mcg  175 mcg Oral QAC breakfast Samuella Cota, MD   175 mcg at 12/08/14 0920  . ondansetron (ZOFRAN) injection 4 mg  4 mg Intravenous Q6H PRN Samuella Cota, MD   4 mg at 12/05/14 0847  . phenytoin (DILANTIN) ER capsule 200 mg  200 mg Oral Once per day on Mon Thu Dawood Elgergawy, MD   200 mg at 12/07/14 0901  . phenytoin (DILANTIN) ER capsule 300 mg  300 mg Oral Once per day on Sun Tue Wed Fri Sat Phillips Climes, MD   300 mg at 12/08/14 0919  . piperacillin-tazobactam (ZOSYN) IVPB 3.375 g  3.375 g Intravenous Q8H Saundra Shelling, RPH   3.375 g at 12/07/14 2230  . sodium chloride 0.9 % injection 3 mL  3 mL Intravenous Q12H Samuella Cota, MD   3 mL at 12/08/14 0920  . sodium chloride 0.9 % injection 3 mL  3 mL Intravenous PRN Samuella Cota, MD   3 mL at 12/06/14 2236  . vancomycin (VANCOCIN) 1,750 mg in sodium chloride 0.9 % 500 mL IVPB  1,750 mg Intravenous Q48H Saundra Shelling, [place]        Filed Vitals:   12/07/14 2108 12/08/14 0529 12/08/14 0852 12/08/14 0917  BP:  144/52  126/52  Pulse:  84    Temp:  97.8 F (36.6 C)    TempSrc:  Oral    Resp:  12    Height:      Weight:  272 lb (123.378 kg)    SpO2: 97% 100% 98%     PHYSICAL EXAM General: NAD, sitting up in chair comfortably. HEENT: Normal. Neck: No JVD, no thyromegaly.  Lungs: Clear to auscultation bilaterally with normal respiratory effort. CV: Nondisplaced PMI.  Regular rate and rhythm, normal S1/S2, no Z6/S0, 3/4 holodiastolic murmur along lower left sternal border and 2/6 ejection systolic murmur  over RUSB.  1-2+ pitting pretibial edema with stasis dermatitis.   Abdomen: Soft, nontender, obese.  Neurologic: Alert and oriented x 3.  Psych: Normal affect. Musculoskeletal: No gross deformities. Extremities: No clubbing or cyanosis.   TELEMETRY: Reviewed telemetry pt in normal sinus rhythm.  LABS: Basic Metabolic Panel:  Recent Labs  12/07/14 0420 12/08/14 0455  NA 134* 136  K 4.2 4.4  CL 103 104  CO2 23 13*  GLUCOSE 95 103*  BUN 56* 60*  CREATININE 3.04* 2.97*  CALCIUM 8.2* 8.5  PHOS  --  3.8   Liver Function Tests:  Recent Labs  12/06/14 0319 12/08/14 0455  AST 17  --   ALT 12  --   ALKPHOS 55  --   BILITOT 0.8  --   PROT 6.7  --   ALBUMIN 3.3* 3.1*    Recent Labs  12/06/14 0319  LIPASE 47   CBC:  Recent Labs  12/06/14 0319 12/08/14 0455  WBC 6.1 7.1  HGB 11.4* 11.3*  HCT 38.2* 36.5*  MCV 88.4 86.7  PLT 183 179   Cardiac Enzymes: No results for input(s): CKTOTAL, CKMB, CKMBINDEX, TROPONINI in the last 72 hours. BNP: Invalid input(s): POCBNP D-Dimer: No results for input(s): DDIMER in the last 72 hours. Hemoglobin A1C: No results for input(s): HGBA1C in the last 72 hours. Fasting Lipid Panel: No results for input(s): CHOL, HDL, LDLCALC, TRIG, CHOLHDL, LDLDIRECT in the last 72 hours. Thyroid Function Tests: No results for input(s): TSH, T4TOTAL, T3FREE, THYROIDAB in the last 72 hours.  Invalid input(s): FREET3 Anemia Panel: No results for input(s): VITAMINB12, FOLATE, FERRITIN, TIBC, IRON, RETICCTPCT in the last 72 hours.  RADIOLOGY: Ct Abdomen Pelvis Wo Contrast  12/06/2014   CLINICAL DATA:  Right upper quadrant pain and fever with fluid retention that has worsened the last 3 days.  EXAM: CT ABDOMEN AND PELVIS WITHOUT CONTRAST  TECHNIQUE: Multidetector CT imaging of the abdomen and pelvis was performed following the standard protocol without IV contrast.  COMPARISON:  August 08, 2014  FINDINGS: The gallbladder is distended with  indistinct borders suggesting possible inflammation. No gallstones are noted. There is minimal ascites in the bilateral pericolic gutter and in the upper pelvis.  The liver, spleen, pancreas and adrenal glands are normal. The patient is status post prior right nephrectomy. There are several small lesions in the left kidney some are high  density in the medial lower pole unchanged compared to prior CT. Evaluation is limited without contrast. There is no small bowel obstruction or diverticulitis. There is a small hiatal hernia.  Partial decompressed bladder is unremarkable. There is no pelvic lymphadenopathy. There is a small right pleural effusion with adjacent compression atelectasis of the posterior right lung base. Degenerative joint changes of the spine are identified.  IMPRESSION: The gallbladder is distended with indistinct borders suggesting possible inflammation. No gallstones are noted. Differential possibility includes acalculus cholecystitis.   Electronically Signed   By: Abelardo Diesel M.D.   On: 12/06/2014 14:32   Nm Hepato W/eject Fract  12/05/2014   CLINICAL DATA:  69 year old with nausea and vomiting  EXAM: NUCLEAR MEDICINE HEPATOBILIARY IMAGING WITH GALLBLADDER EF  TECHNIQUE: Sequential images of the abdomen were obtained out to 60 minutes following intravenous administration of radiopharmaceutical. After slow intravenous infusion of 2.51 mcg micrograms Cholecystokinin, gallbladder ejection fraction was determined.  RADIOPHARMACEUTICALS:  5.0 [place] [id number] Choletec  COMPARISON:  None.  FINDINGS: There is normal excretion radiotracer into the gallbladder and small bowel.  At 30 min, normal ejection fraction is greater than 30% (actually ejection fraction was 60%).  The patient did not experience symptoms during CCK infusion.  IMPRESSION: No evidence of acute cholecystitis.   Electronically Signed   By: Rosemarie Ax   On: 12/05/2014 15:37   Dg Chest Port 1 View  12/06/2014   CLINICAL DATA:   New onset fever today. History of CHF, diabetes, hypertension, COPD, lower leg cellulitis, sleep apnea, kidney disease, and seizures.  EXAM: PORTABLE CHEST - 1 VIEW  COMPARISON:  12/04/2014  FINDINGS: Enlargement of the cardiac silhouette is unchanged. Prominence of the superior mediastinum is likely secondary to technique. Vascular congestion and interstitial opacities have slightly increased compared to the prior study. There may be small bilateral pleural effusions. No segmental airspace consolidation or pneumothorax is seen.  IMPRESSION: Slightly increased pulmonary edema.   Electronically Signed   By: Logan Bores   On: 12/06/2014 13:09   Dg Chest Portable 1 View  12/04/2014   CLINICAL DATA:  Shortness of breath.  Chest pain.  EXAM: PORTABLE CHEST - 1 VIEW  COMPARISON:  06/11/2014  FINDINGS: Stable cardiomegaly. Vascular congestion and mild interstitial edema, similar to prior exam. There is blunting of both costophrenic angles, likely small effusions. No definite confluent airspace disease. No pneumothorax.  IMPRESSION: Findings consistent with mild CHF.   Electronically Signed   By: Jeb Levering M.D.   On: 12/04/2014 10:45   US Abdomen Limited Ruq  12/04/2014   CLINICAL DATA:  Abdominal pain for 2 days.  Prior right nephrectomy.  EXAM: US ABDOMEN LIMITED - RIGHT UPPER QUADRANT  COMPARISON:  08/08/2014 CT abdomen and pelvis  FINDINGS: Gallbladder:  Hydropic gallbladder measuring approximately 6 cm in diameter and 12 cm in length. There may be a small amount of pericholecystic fluid. Gallbladder wall is not thickened, measuring 2 mm. There is report of a positive sonographic Murphy's sign, although the patient complained of pain over the gallbladder and pancreas.  Common bile duct:  Diameter: 4.5 mm  Liver:  Mildly enlarged, with a length of 17.2 cm. No focal lesion identified. Within normal limits in parenchymal echogenicity.  Limited imaging of the pancreas demonstrated a diffusely heterogeneous  and hypoechoic echotexture. Small right pleural effusion was noted.  IMPRESSION: 1. Hydropic gallbladder. No gallstones or gallbladder wall thickening, however possible positive sonographic Murphy's sign and trace pericholecystic fluid are present and acalculous cholecystitis  is not excluded. Nuclear medicine HIDA scan could be performed to evaluate for cystic duct obstruction as clinically indicated. 2. No biliary dilatation. 3. Heterogeneous, hypoechoic pancreas which may reflect pancreatitis. Correlation with serum lipase level recommended. 4. Small right pleural effusion.   Electronically Signed   By: Logan Bores   On: 12/04/2014 12:02      ASSESSMENT AND PLAN: 1. Acute on chronic combined systolic and diastolic heart failure: 1.6 L output in last 24 hours on Lasix 80 mg IV bid (received dose last night after being on hold). Weight 276 lbs-->L272 lbs. Will continue. Will eventually require aortic valve replacement for severe regurgitation and decline in LV function, complicated by CKD which may ultimately require hemodialysis. Left and right heart catheterization with coronary angiography on hold for time being, possibly Monday (1/4).  2. Sepsis: Currently on vancomycin and Zosyn. Blood cultures negative. WBC normal. Management per hospitalist service.  3. Acute on chronic renal insufficiency, CKD stage 4: Renal following. May ultimately require hemodialysis. As per nephrology, function not likely to significantly improve with recommendation to continue diuresis.  4. Essential HTN: Controlled on present therapy which includes clonidine. On amlodipine as outpatient.  5. Abdominal pain, nausea, vomiting, fever: HIDA negative. On Abx.   6. Chest pain: Resolved. Will eventually need coronary angiography given decline in LV function.    Kate Sable, M.D., F.A.C.C.

## 2014-12-08 NOTE — Progress Notes (Addendum)
Patient ID: [name] [name], male   DOB: 26-Jun-1946, 69 y.o.   MRN: [id number] S:Feels better  O:BP 126/52 mmHg  Pulse 84  Temp(Src) 97.8 F (36.6 C) (Oral)  Resp 12  Ht 6\' 1"  (1.854 m)  Wt 123.378 kg (272 lb)  BMI 35.89 kg/m2  SpO2 98%  Intake/Output Summary (Last 24 hours) at 12/08/14 1207 Last data filed at 12/08/14 1130  Gross per 24 hour  Intake    120 ml  Output   2775 ml  Net  -2655 ml   Intake/Output: I/O last 3 completed shifts: In: 1060 [P.O.:960; IV Piggyback:100] Out: 2980 [Urine:2980]  Intake/Output this shift:  Total I/O In: 120 [P.O.:120] Out: 775 [Urine:775] Weight change: -2.132 kg (-4 lb 11.2 oz) Gen:WD obese [name] in NAD CVS:no rub Resp:bibasilar crackles MAU:[id number] Ext:1+ edema   Recent Labs Lab 12/04/14 0959 12/04/14 1809 12/05/14 1351 12/06/14 0319 12/07/14 0420 12/08/14 0455  NA 139  --  140 138 134* 136  K 4.7  --  4.2 4.7 4.2 4.4  CL 104  --  105 103 103 104  CO2 29  --  26 26 23  13*  GLUCOSE 108*  --  107* 90 95 103*  BUN 53*  --  55* 54* 56* 60*  CREATININE 2.37* 2.40* 2.66* 2.69* 3.04* 2.97*  ALBUMIN 3.5  --   --  3.3*  --  3.1*  CALCIUM 8.5  --  8.8 8.7 8.2* 8.5  PHOS  --   --   --   --   --  3.8  AST 18  --   --  17  --   --   ALT 15  --   --  12  --   --    Liver Function Tests:  Recent Labs Lab 12/04/14 0959 12/06/14 0319 12/08/14 0455  AST 18 17  --   ALT 15 12  --   ALKPHOS 68 55  --   BILITOT 0.5 0.8  --   PROT 6.7 6.7  --   ALBUMIN 3.5 3.3* 3.1*    Recent Labs Lab 12/04/14 0959 12/06/14 0319  LIPASE 61* 47   No results for input(s): AMMONIA in the last 168 hours. CBC:  Recent Labs Lab 12/04/14 0959 12/04/14 1809 12/06/14 0319 12/08/14 0455  WBC 7.2 6.3 6.1 7.1  NEUTROABS 4.4  --   --   --   HGB 12.0* 11.7* 11.4* 11.3*  HCT 39.4 37.9* 38.2* 36.5*  MCV 89.5 87.5 88.4 86.7  PLT 158 177 183 179   Cardiac Enzymes:  Recent Labs Lab 12/04/14 0959 12/04/14 1225 12/04/14 1809 12/04/14 2312  12/05/14 0550  TROPONINI 0.04* 0.04* 0.04* 0.03 0.03   CBG:  Recent Labs Lab 12/07/14 1156 12/07/14 1640 12/07/14 2108 12/08/14 0802 12/08/14 1119  GLUCAP 224* 105* 150* 87 118*    Iron Studies: No results for input(s): IRON, TIBC, TRANSFERRIN, FERRITIN in the last 72 hours. Studies/Results: Ct Abdomen Pelvis Wo Contrast  12/06/2014   CLINICAL DATA:  Right upper quadrant pain and fever with fluid retention that has worsened the last 3 days.  EXAM: CT ABDOMEN AND PELVIS WITHOUT CONTRAST  TECHNIQUE: Multidetector CT imaging of the abdomen and pelvis was performed following the standard protocol without IV contrast.  COMPARISON:  August 08, 2014  FINDINGS: The gallbladder is distended with indistinct borders suggesting possible inflammation. No gallstones are noted. There is minimal ascites in the bilateral pericolic gutter and in the upper pelvis.  The liver, spleen,  pancreas and adrenal glands are normal. The patient is status post prior right nephrectomy. There are several small lesions in the left kidney some are high density in the medial lower pole unchanged compared to prior CT. Evaluation is limited without contrast. There is no small bowel obstruction or diverticulitis. There is a small hiatal hernia.  Partial decompressed bladder is unremarkable. There is no pelvic lymphadenopathy. There is a small right pleural effusion with adjacent compression atelectasis of the posterior right lung base. Degenerative joint changes of the spine are identified.  IMPRESSION: The gallbladder is distended with indistinct borders suggesting possible inflammation. No gallstones are noted. Differential possibility includes acalculus cholecystitis.   Electronically Signed   By: Abelardo Diesel M.D.   On: 12/06/2014 14:32   Dg Chest Port 1 View  12/06/2014   CLINICAL DATA:  New onset fever today. History of CHF, diabetes, hypertension, COPD, lower leg cellulitis, sleep apnea, kidney disease, and seizures.   EXAM: PORTABLE CHEST - 1 VIEW  COMPARISON:  12/04/2014  FINDINGS: Enlargement of the cardiac silhouette is unchanged. Prominence of the superior mediastinum is likely secondary to technique. Vascular congestion and interstitial opacities have slightly increased compared to the prior study. There may be small bilateral pleural effusions. No segmental airspace consolidation or pneumothorax is seen.  IMPRESSION: Slightly increased pulmonary edema.   Electronically Signed   By: Logan Bores   On: 12/06/2014 13:09   . antiseptic oral rinse  7 mL Mouth Rinse BID  . aspirin EC  325 mg Oral Daily  . atorvastatin  40 mg Oral q1800  . budesonide-formoterol  2 puff Inhalation BID  . cloNIDine  0.2 mg Oral TID  . desmopressin  0.2 mg Oral BID  . dexamethasone  0.5 mg Oral Daily  . furosemide  80 mg Intravenous BID  . heparin  5,000 Units Subcutaneous 3 times per day  . insulin aspart  0-9 Units Subcutaneous TID WC  . levothyroxine  175 mcg Oral QAC breakfast  . phenytoin  200 mg Oral Once per day on Mon Thu  . phenytoin  300 mg Oral Once per day on Sun Tue Wed Fri Sat  . piperacillin-tazobactam (ZOSYN)  IV  3.375 g Intravenous Q8H  . sodium chloride  3 mL Intravenous Q12H  . tiotropium  18 mcg Inhalation Daily  . vancomycin  1,750 mg Intravenous Q48H    BMET    Component Value Date/Time   NA 136 12/08/2014 0455   K 4.4 12/08/2014 0455   CL 104 12/08/2014 0455   CO2 13* 12/08/2014 0455   GLUCOSE 103* 12/08/2014 0455   BUN 60* 12/08/2014 0455   CREATININE 2.97* 12/08/2014 0455   CREATININE 2.58* 11/21/2014 0914   CALCIUM 8.5 12/08/2014 0455   CALCIUM 8.5 09/09/2012 0536   GFRNONAA 20* 12/08/2014 0455   GFRNONAA 24* 11/21/2014 0914   GFRAA 23* 12/08/2014 0455   GFRAA 28* 11/21/2014 0914   CBC    Component Value Date/Time   WBC 7.1 12/08/2014 0455   RBC 4.21* 12/08/2014 0455   HGB 11.3* 12/08/2014 0455   HCT 36.5* 12/08/2014 0455   PLT 179 12/08/2014 0455   MCV 86.7 12/08/2014 0455    MCH 26.8 12/08/2014 0455   MCHC 31.0 12/08/2014 0455   RDW 17.1* 12/08/2014 0455   LYMPHSABS 1.2 12/04/2014 0959   MONOABS 0.8 12/04/2014 0959   EOSABS 0.8* 12/04/2014 0959   BASOSABS 0.1 12/04/2014 0959     Assessment/Plan: 1. AKI/CKD- pt with advanced CKD at  baseline and now with small increase in Scr. [name] at high risk for CIN and I don't think we will see much improvement of renal function over the next several days as he will need ongoing diuresis. I had a frank discussion with him about the high likelihood of impending dialysis and described hemodialysis. He understands and is willing to proceed if needed. He will need vein mapping and permanent access (although will likely need to wait until after cardiac cath for this). Would likely require an HD catheter following procedure. No urgent indication at this time and will continue to follow UOP and daily Scr 1. Responding well to diuresis and heading toward baseline 2. Limit contrast exposure and would hold lasix 24 hours prior to cardiac cath to help minimize risk 2. Acute on chronic CHF (severe aortic insufficiency and EF 40-45%)- marked volume overload, will resume IV lasix and follow his clinical response. As above, I don't think we can significantly improve his renal function as his baseline CKD is advanced. 1. Continue with IV lasix bid and follow 3. Severe Aortic insufficiency- work up per Drs. Cooper/Skains 4. HTN- stable 5. 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 Urinary hesitancy/retention- bladder scan initially was 261 yesterday but only 88cc today.   8. Morbid obesity 9. COPD 10. Anemia of chronic disease- will follow H/H and iron stores  Lin[place] A

## 2014-12-08 NOTE — Significant Event (Signed)
Rapid Response Event Note Called by primary RN to see pt for Rt side facial numbness Overview: Time Called: 2245 Arrival Time: 2246 Event Type: Neurologic  Initial Focused Assessment:  Per report from primary RN pt reported Rt sided facial numbness & change in "taste in mouth" after starting night time dose of Zosyn.  On assessment pt has Rt sided facial numbness, old loss of Bil peripheral vision & slow speech (also present when I saw him 12/06/14).  Discussed the pt with Dr. Nicole Kindred & pt was taken for a Stat CT Head.  The pt is a poor historian and was unable to state the onset of facial numbness.  The primary RN last saw him around 2045 and pt told her around 2220 that he was having the new facial numbness. Kathline Magic, NP (primary team) was updated with neuro assessment prior to pt's transport to CT scan.  Interventions: CT Head  Event Summary: Name of Physician Notified: Dr. Nicole Kindred at 2255   Outcome: Stayed in room and stabalized  Axil Copeman Hedgecock

## 2014-12-09 DIAGNOSIS — I519 Heart disease, unspecified: Secondary | ICD-10-CM

## 2014-12-09 DIAGNOSIS — I472 Ventricular tachycardia: Secondary | ICD-10-CM

## 2014-12-09 LAB — RENAL FUNCTION PANEL
Albumin: 3.1 g/dL — ABNORMAL LOW (ref 3.5–5.2)
Anion gap: 12 (ref 5–15)
BUN: 65 mg/dL — ABNORMAL HIGH (ref 6–23)
CHLORIDE: 105 meq/L (ref 96–112)
CO2: 20 mmol/L (ref 19–32)
CREATININE: 2.85 mg/dL — AB (ref 0.50–1.35)
Calcium: 8.3 mg/dL — ABNORMAL LOW (ref 8.4–10.5)
GFR, EST AFRICAN AMERICAN: 24 mL/min — AB (ref >90)
GFR, EST NON AFRICAN AMERICAN: 21 mL/min — AB (ref >90)
Glucose, Bld: 121 mg/dL — ABNORMAL HIGH (ref 70–99)
Phosphorus: 3.9 mg/dL (ref 2.3–4.6)
Potassium: 4.3 mmol/L (ref 3.5–5.1)
Sodium: 137 mmol/L (ref 135–145)

## 2014-12-09 LAB — GLUCOSE, CAPILLARY
GLUCOSE-CAPILLARY: 126 mg/dL — AB (ref 70–99)
GLUCOSE-CAPILLARY: 101 mg/dL — AB (ref 70–99)
Glucose-Capillary: 96 mg/dL (ref 70–99)
Glucose-Capillary: 104 mg/dL — ABNORMAL HIGH (ref 70–99)

## 2014-12-09 LAB — MAGNESIUM: Magnesium: 2.5 mg/dL (ref 1.5–2.5)

## 2014-12-09 MED ORDER — CIPROFLOXACIN HCL 500 MG PO TABS
500.0000 mg | ORAL_TABLET | Freq: Two times a day (BID) | ORAL | Status: DC
Start: 2014-12-09 — End: 2014-12-11
  Administered 2014-12-09 – 2014-12-11 (×4): 500 mg via ORAL
  Filled 2014-12-09 (×4): qty 1

## 2014-12-09 MED ORDER — METRONIDAZOLE 500 MG PO TABS
500.0000 mg | ORAL_TABLET | Freq: Three times a day (TID) | ORAL | Status: DC
  Administered 2014-12-09 – 2014-12-11 (×6): 500 mg via ORAL
  Filled 2014-12-09 (×7): qty 1

## 2014-12-09 MED ORDER — AMOXICILLIN-POT CLAVULANATE 875-125 MG PO TABS: 1.0000 | ORAL_TABLET | Freq: Two times a day (BID) | ORAL | Status: DC

## 2014-12-09 NOTE — Progress Notes (Signed)
Patient Demographics  [name] [name], is a 69 y.o. male, DOB - 10-05-1946, EXN:[id number]  Admit date - 12/04/2014   Admitting Physician Troy Sine, MD  Outpatient Primary MD for the patient is [name], MD  LOS - 5   Chief Complaint  Patient presents with  . Shortness of Breath      Admission history of present illness/brief narrative: 69 year-old gentleman presenting to the Emergency Dept today with multiple complaints. These include chest pain, shortness of breath, and RUQ pain. He has been previously scheduled for elective cardiac catheterization 12/29 to evaluate severe aortic insufficiency and declining LV function. However, the patient came to the emergency department today because he has noticed an acute change in his symptoms over the last 24 hours. He complains of aching all over. He also has had some nausea and vomiting. He complains of pain in his upper abdominal area. He has also had chest pain. His breathing has been worse over the last 2-3 days. He does feel that his "retaining more fluid." He admits to dietary indiscretion with salt. He has been taking his medications. Right upper quadrant ultrasound was done and there concern about acalculous cholecystitis general surgery recommended HIDA scan which was negative  Subjective:   [name] [name]  Sitting in chair- no complaints   Assessment & Plan    Principal Problem:   Sepsis Active Problems:   Obese   Hypertension   Sleep apnea   Panhypopituitarism   Moderate aortic regurgitation   Diabetes   CKD (chronic kidney disease), stage IV   Acute on chronic combined systolic and diastolic CHF (congestive heart failure)   SOB (shortness of breath)   Fever   Pain in the chest   Pyrexia   Dyspnea   Chest pain: - Cardiac consult- cath at some point- will need renal maximization may need renal  consult  Abdominal pain, nausea, vomiting, -Questionable acalculous cholecystitis on ultrasound, HIDA negative -tolerating food  Fever -BC x2 NGTD -vanc/zosyn- transition over to PO cipro/flagyl for a 10 days course -CT scan abd show inflammation- tender in RUQ is gone after ABX  Acute on chronic systolic/diastolic heart failure,  -LVEF 40-45 percent with diffuse hypokinesis, diastolic dysfunction, moderate to severe aortic regurgitation, severe pulmonary hypertension by echocardiogram 12/10. TEE and heart catheter suggested at that time for w/u for aortic regurgitation. - Evidence of volume overload but no evidence of pulmonary compromise. Further management and diuresis per cardiology. Lasix per cards -Will need right and left heart cath and likely need aortic valve replacement in the setting of severe aortic insufficiency as per cardiology  Panhypopituitarism -Status post pituitary excision, continue with desmopressin, Decadron, Synthroid. -May need stress dose of steroid if going for surgery.  Seizure disorder -Status post craniectomy, continue with phenytoin.  Diabetes mellitus -Acceptable, continue with insulin sliding scale.  COPD -No active wheezing, on 2 L at baseline, appears stable  chronic renal failure-stage IV -Appears to be at baseline, continue to monitor  -appreciate renal input   Code Status: Full  Family Communication: spoke with daughter yest on phone  Disposition Plan: Remains on telemetry   Procedures  None   Consults   Cardiology Surgery   Medications  Scheduled Meds: . antiseptic oral  rinse  7 mL Mouth Rinse BID  . aspirin EC  325 mg Oral Daily  . atorvastatin  40 mg Oral q1800  . budesonide-formoterol  2 puff Inhalation BID  . ciprofloxacin  500 mg Oral BID  . cloNIDine  0.2 mg Oral TID  . desmopressin  0.2 mg Oral BID  . dexamethasone  0.5 mg Oral Daily  . furosemide  80 mg Intravenous BID  . heparin  5,000 Units Subcutaneous 3  times per day  . insulin aspart  0-9 Units Subcutaneous TID WC  . levothyroxine  175 mcg Oral QAC breakfast  . metroNIDAZOLE  500 mg Oral 3 times per day  . phenytoin  200 mg Oral Once per day on Mon Thu  . phenytoin  300 mg Oral Once per day on Sun Tue Wed Fri Sat  . sodium chloride  3 mL Intravenous Q12H  . tiotropium  18 mcg Inhalation Daily   Continuous Infusions:  PRN Meds:.sodium chloride, acetaminophen, acetaminophen, albuterol, ondansetron (ZOFRAN) IV, sodium chloride  DVT Prophylaxis   Heparin -  Lab Results  Component Value Date   PLT 179 12/08/2014    Antibiotics    Anti-infectives    Start     Dose/Rate Route Frequency Ordered Stop   12/09/14 1400  metroNIDAZOLE (FLAGYL) tablet 500 mg     500 mg Oral 3 times per day 12/09/14 1111     12/09/14 1200  amoxicillin-clavulanate (AUGMENTIN) 875-125 MG per tablet 1 tablet  Status:  Discontinued     1 tablet Oral Every 12 hours 12/09/14 1111 12/09/14 1124   12/09/14 1200  ciprofloxacin (CIPRO) tablet 500 mg     500 mg Oral 2 times daily 12/09/14 1124     12/08/14 0900  vancomycin (VANCOCIN) 1,750 mg in sodium chloride 0.9 % 500 mL IVPB  Status:  Discontinued     1,750 mg250 mL/hr over 120 Minutes Intravenous Every 48 hours 12/06/14 0753 12/09/14 1111   12/06/14 1500  piperacillin-tazobactam (ZOSYN) IVPB 3.375 g  Status:  Discontinued     3.375 g12.5 mL/hr over 240 Minutes Intravenous Every 8 hours 12/06/14 0748 12/09/14 1111   12/06/14 0800  piperacillin-tazobactam (ZOSYN) IVPB 3.375 g     3.375 g100 mL/hr over 30 Minutes Intravenous  Once 12/06/14 0747 12/06/14 0921   12/06/14 0800  vancomycin (VANCOCIN) 2,000 mg in sodium chloride 0.9 % 500 mL IVPB     2,000 mg250 mL/hr over 120 Minutes Intravenous  Once 12/06/14 0747 12/06/14 1211   12/06/14 0700  cefTRIAXone (ROCEPHIN) 1 g in dextrose 5 % 50 mL IVPB - Premix  Status:  Discontinued     1 g100 mL/hr over 30 Minutes Intravenous  Once 12/06/14 0658 12/06/14 0748   12/04/14  1830  cefTRIAXone (ROCEPHIN) 1 g in dextrose 5 % 50 mL IVPB - Premix  Status:  Discontinued     1 g100 mL/hr over 30 Minutes Intravenous Every 24 hours 12/04/14 1727 12/05/14 1717          Objective:   Filed Vitals:   12/09/14 0000 12/09/14 0620 12/09/14 0840 12/09/14 0938  BP:  121/41  129/47  Pulse:  84    Temp: 98.4 F (36.9 C) 97.7 F (36.5 C)    TempSrc:  Oral    Resp:  28    Height:      Weight:  124.648 kg (274 lb 12.8 oz)    SpO2:  97% 98%     Wt Readings from Last 3 Encounters:  12/09/14 124.648 kg (274 lb 12.8 oz)  11/16/14 127.007 kg (280 lb)  06/12/14 121.201 kg (267 lb 3.2 oz)     Intake/Output Summary (Last 24 hours) at 12/09/14 1230 Last data filed at 12/09/14 1100  Gross per 24 hour  Intake   1383 ml  Output   1825 ml  Net   -442 ml     Physical Exam  Awake Alert, Oriented X 3, No new F.N deficits, Normal affect Symmetrical Chest wall movement, Good air movement bilaterally, CTAB RRR,No Gallops,Rubs or new Murmurs, No Parasternal Heave +ve B.Sounds, obese +edema   Data Review   Micro Results Recent Results (from the past 240 hour(s))  MRSA PCR Screening     Status: None   Collection Time: 12/04/14  5:44 PM  Result Value Ref Range Status   MRSA by PCR NEGATIVE NEGATIVE Final    Comment:        The GeneXpert MRSA Assay (FDA approved for NASAL specimens only), is one component of a comprehensive MRSA colonization surveillance program. It is not intended to diagnose MRSA infection nor to guide or monitor treatment for MRSA infections.   Culture, blood (routine x 2)     Status: None (Preliminary result)   Collection Time: 12/06/14  8:45 AM  Result Value Ref Range Status   Specimen Description BLOOD LEFT ARM  Final   Special Requests BOTTLES DRAWN AEROBIC AND ANAEROBIC 10CC  Final   Culture   Final           BLOOD CULTURE RECEIVED NO GROWTH TO DATE CULTURE WILL BE HELD FOR 5 DAYS BEFORE ISSUING A FINAL NEGATIVE REPORT Note: Culture  results may be compromised due to an inadequate volume of blood received in culture bottles. Performed at [facility]    Report Status PENDING  Incomplete  Culture, blood (routine x 2)     Status: None (Preliminary result)   Collection Time: 12/06/14  8:50 AM  Result Value Ref Range Status   Specimen Description BLOOD RIGHT HAND  Final   Special Requests BOTTLES DRAWN AEROBIC ONLY 10CC  Final   Culture   Final           BLOOD CULTURE RECEIVED NO GROWTH TO DATE CULTURE WILL BE HELD FOR 5 DAYS BEFORE ISSUING A FINAL NEGATIVE REPORT Note: Culture results may be compromised due to an excessive volume of blood received in culture bottles. Performed at [facility]    Report Status PENDING  Incomplete  Clostridium Difficile by PCR     Status: None   Collection Time: 12/06/14 10:42 PM  Result Value Ref Range Status   C difficile by pcr NEGATIVE NEGATIVE Final    Radiology Reports Ct Head Wo Contrast  12/08/2014   CLINICAL DATA:  Initial evaluation for right-sided facial numbness.  EXAM: CT HEAD WITHOUT CONTRAST  TECHNIQUE: Contiguous axial images were obtained from the base of the skull through the vertex without intravenous contrast.  COMPARISON:  Prior MRI from 05/27/2010.  FINDINGS: Encephalomalacia within the anterior inferior left frontal lobe most likely postsurgical in nature. Dural thickening with deformity of the calvarium also likely related to prior surgery. Prominent vascular calcifications present within the carotid siphons and distal vertebral arteries.  Diffuse prominence of the CSF containing spaces compatible with generalized atrophy. No acute large vessel territory infarct. No mass lesion or midline shift. No hydrocephalus. No extra-axial fluid collection.  Scalp soft tissues within normal limits. No acute abnormality seen about the orbits.  Paranasal  sinuses are clear.  No mastoid effusion.  IMPRESSION: 1. No acute intracranial process. 2. Encephalomalacia within  the anterior inferior left frontal lobe with overlying calvarial irregularity, likely related to prior pituitary surgery.   Electronically Signed   By: Jeannine Boga M.D.   On: 12/08/2014 23:58    CBC  Recent Labs Lab 12/04/14 0959 12/04/14 1809 12/06/14 0319 12/08/14 0455  WBC 7.2 6.3 6.1 7.1  HGB 12.0* 11.7* 11.4* 11.3*  HCT 39.4 37.9* 38.2* 36.5*  PLT 158 177 183 179  MCV 89.5 87.5 88.4 86.7  MCH 27.3 27.0 26.4 26.8  MCHC 30.5 30.9 29.8* 31.0  RDW 17.2* 17.2* 17.3* 17.1*  LYMPHSABS 1.2  --   --   --   MONOABS 0.8  --   --   --   EOSABS 0.8*  --   --   --   BASOSABS 0.1  --   --   --     Chemistries   Recent Labs Lab 12/04/14 0959  12/05/14 1351 12/06/14 0319 12/07/14 0420 12/08/14 0455 12/09/14 0400  NA 139  --  140 138 134* 136 137  K 4.7  --  4.2 4.7 4.2 4.4 4.3  CL 104  --  105 103 103 104 105  CO2 29  --  26 26 23  13* 20  GLUCOSE 108*  --  107* 90 95 103* 121*  BUN 53*  --  55* 54* 56* 60* 65*  CREATININE 2.37*  < > 2.66* 2.69* 3.04* 2.97* 2.85*  CALCIUM 8.5  --  8.8 8.7 8.2* 8.5 8.3*  MG  --   --   --   --   --   --  2.5  AST 18  --   --  17  --   --   --   ALT 15  --   --  12  --   --   --   ALKPHOS 68  --   --  55  --   --   --   BILITOT 0.5  --   --  0.8  --   --   --   < > = values in this interval not displayed. ------------------------------------------------------------------------------------------------------------------ estimated creatinine clearance is 33.4 mL/min (by C-G formula based on Cr of 2.85). ------------------------------------------------------------------------------------------------------------------ No results for input(s): HGBA1C in the last 72 hours. ------------------------------------------------------------------------------------------------------------------ No results for input(s): CHOL, HDL, LDLCALC, TRIG, CHOLHDL, LDLDIRECT in the last 72  hours. ------------------------------------------------------------------------------------------------------------------ No results for input(s): TSH, T4TOTAL, T3FREE, THYROIDAB in the last 72 hours.  Invalid input(s): FREET3 ------------------------------------------------------------------------------------------------------------------ No results for input(s): VITAMINB12, FOLATE, FERRITIN, TIBC, IRON, RETICCTPCT in the last 72 hours.  Coagulation profile No results for input(s): INR, PROTIME in the last 168 hours.  No results for input(s): DDIMER in the last 72 hours.  Cardiac Enzymes  Recent Labs Lab 12/04/14 1809 12/04/14 2312 12/05/14 0550  TROPONINI 0.04* 0.03 0.03   ------------------------------------------------------------------------------------------------------------------ Invalid input(s): POCBNP     Time Spent in minutes   25 minutes   Yomar Mejorado DO on 12/09/2014 at 12:30 PM  Between 7am to 7pm - Pager - [phone]  After 7pm go to [email or web address] - password [email or web address]  And look for the night coverage person covering for me after hours  [facility] Office  [phone]

## 2014-12-09 NOTE — Progress Notes (Signed)
Patient ID: [name] [name], male   DOB: 07/10/1946, 69 y.o.   MRN: [id number] S: no new complaints O:BP 129/45 mmHg  Pulse 80  Temp(Src) 97.5 F (36.4 C) (Oral)  Resp 16  Ht 6\' 1"  (1.854 m)  Wt 124.648 kg (274 lb 12.8 oz)  BMI 36.26 kg/m2  SpO2 92%  Intake/Output Summary (Last 24 hours) at 12/09/14 1543 Last data filed at 12/09/14 1100  Gross per 24 hour  Intake   1263 ml  Output   1425 ml  Net   -162 ml   Intake/Output: I/O last 3 completed shifts: In: 1263 [P.O.:960; I.V.:3; IV Piggyback:300] Out: 3625 [Urine:3625]  Intake/Output this shift:  Total I/O In: 240 [P.O.:240] Out: 575 [Urine:575] Weight change: 1.27 kg (2 lb 12.8 oz) Gen:WD obese [name] in NAD, sitting in chair CVS:no rub Resp:bibasilar crackles DPO:EUMPNT Ext:1+ edema   Recent Labs Lab 12/04/14 0959 12/04/14 1809 12/05/14 1351 12/06/14 0319 12/07/14 0420 12/08/14 0455 12/09/14 0400  NA 139  --  140 138 134* 136 137  K 4.7  --  4.2 4.7 4.2 4.4 4.3  CL 104  --  105 103 103 104 105  CO2 29  --  26 26 23  13* 20  GLUCOSE 108*  --  107* 90 95 103* 121*  BUN 53*  --  55* 54* 56* 60* 65*  CREATININE 2.37* 2.40* 2.66* 2.69* 3.04* 2.97* 2.85*  ALBUMIN 3.5  --   --  3.3*  --  3.1* 3.1*  CALCIUM 8.5  --  8.8 8.7 8.2* 8.5 8.3*  PHOS  --   --   --   --   --  3.8 3.9  AST 18  --   --  17  --   --   --   ALT 15  --   --  12  --   --   --    Liver Function Tests:  Recent Labs Lab 12/04/14 0959 12/06/14 0319 12/08/14 0455 12/09/14 0400  AST 18 17  --   --   ALT 15 12  --   --   ALKPHOS 68 55  --   --   BILITOT 0.5 0.8  --   --   PROT 6.7 6.7  --   --   ALBUMIN 3.5 3.3* 3.1* 3.1*    Recent Labs Lab 12/04/14 0959 12/06/14 0319  LIPASE 61* 47   No results for input(s): AMMONIA in the last 168 hours. CBC:  Recent Labs Lab 12/04/14 0959 12/04/14 1809 12/06/14 0319 12/08/14 0455  WBC 7.2 6.3 6.1 7.1  NEUTROABS 4.4  --   --   --   HGB 12.0* 11.7* 11.4* 11.3*  HCT 39.4 37.9* 38.2* 36.5*  MCV  89.5 87.5 88.4 86.7  PLT 158 177 183 179   Cardiac Enzymes:  Recent Labs Lab 12/04/14 0959 12/04/14 1225 12/04/14 1809 12/04/14 2312 12/05/14 0550  TROPONINI 0.04* 0.04* 0.04* 0.03 0.03   CBG:  Recent Labs Lab 12/08/14 1626 12/08/14 2132 12/08/14 2258 12/09/14 0753 12/09/14 1139  GLUCAP 93 86 80 126* 101*    Iron Studies: No results for input(s): IRON, TIBC, TRANSFERRIN, FERRITIN in the last 72 hours. Studies/Results: Ct Head Wo Contrast  12/08/2014   CLINICAL DATA:  Initial evaluation for right-sided facial numbness.  EXAM: CT HEAD WITHOUT CONTRAST  TECHNIQUE: Contiguous axial images were obtained from the base of the skull through the vertex without intravenous contrast.  COMPARISON:  Prior MRI from 05/27/2010.  FINDINGS: Encephalomalacia  within the anterior inferior left frontal lobe most likely postsurgical in nature. Dural thickening with deformity of the calvarium also likely related to prior surgery. Prominent vascular calcifications present within the carotid siphons and distal vertebral arteries.  Diffuse prominence of the CSF containing spaces compatible with generalized atrophy. No acute large vessel territory infarct. No mass lesion or midline shift. No hydrocephalus. No extra-axial fluid collection.  Scalp soft tissues within normal limits. No acute abnormality seen about the orbits.  Paranasal sinuses are clear.  No mastoid effusion.  IMPRESSION: 1. No acute intracranial process. 2. 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   Electronically Signed   By: Jeannine Boga M.D.   On: 12/08/2014 23:58   . antiseptic oral rinse  7 mL Mouth Rinse BID  . aspirin EC  325 mg Oral Daily  . atorvastatin  40 mg Oral q1800  . budesonide-formoterol  2 puff Inhalation BID  . ciprofloxacin  500 mg Oral BID  . cloNIDine  0.2 mg Oral TID  . desmopressin  0.2 mg Oral BID  .  dexamethasone  0.5 mg Oral Daily  . furosemide  80 mg Intravenous BID  . heparin  5,000 Units Subcutaneous 3 times per day  . insulin aspart  0-9 Units Subcutaneous TID WC  . levothyroxine  175 mcg Oral QAC breakfast  . metroNIDAZOLE  500 mg Oral 3 times per day  . phenytoin  200 mg Oral Once per day on Mon Thu  . phenytoin  300 mg Oral Once per day on Sun Tue Wed Fri Sat  . sodium chloride  3 mL Intravenous Q12H  . tiotropium  18 mcg Inhalation Daily    BMET    Component Value Date/Time   NA 137 12/09/2014 0400   K 4.3 12/09/2014 0400   CL 105 12/09/2014 0400   CO2 20 12/09/2014 0400   GLUCOSE 121* 12/09/2014 0400   BUN 65* 12/09/2014 0400   CREATININE 2.85* 12/09/2014 0400   CREATININE 2.58* 11/21/2014 0914   CALCIUM 8.3* 12/09/2014 0400   CALCIUM 8.5 09/09/2012 0536   GFRNONAA 21* 12/09/2014 0400   GFRNONAA 24* 11/21/2014 0914   GFRAA 24* 12/09/2014 0400   GFRAA 28* 11/21/2014 0914   CBC    Component Value Date/Time   WBC 7.1 12/08/2014 0455   RBC 4.21* 12/08/2014 0455   HGB 11.3* 12/08/2014 0455   HCT 36.5* 12/08/2014 0455   PLT 179 12/08/2014 0455   MCV 86.7 12/08/2014 0455   MCH 26.8 12/08/2014 0455   MCHC 31.0 12/08/2014 0455   RDW 17.1* 12/08/2014 0455   LYMPHSABS 1.2 12/04/2014 0959   MONOABS 0.8 12/04/2014 0959   EOSABS 0.8* 12/04/2014 0959   BASOSABS 0.1 12/04/2014 0959     Assessment/Plan: 1. AKI/CKD-  1. Responding well to diuresis and stable GFR 2. Limit contrast exposure and would hold lasix 24 hours prior to cardiac cath to help minimize risk 2. Acute on chronic CHF (severe aortic insufficiency and EF 40-45%)- marked volume overload, 1. Continue with IV lasix bid and follow 3. Severe Aortic insufficiency- work up per Drs. Cooper/Skains 4. HTN- stable 5. 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 Urinary hesitancy/retention- bladder scan initially was 261 yesterday but only 88cc today.   8. Morbid  obesity 9. COPD 10. Anemia of chronic disease- will follow H/H and iron stores  Ashlinn Hemrick, B

## 2014-12-09 NOTE — Progress Notes (Signed)
[facility] progress note. Chief complaint. Right facial numbness. History of present illness. This 69 year old male in hospital following complaints of chest pain. Patient has multiple medical problems including congestive heart failure, seizure disorder, diabetes, COPD, etc. Complained to nursing of right facial numbness. Rapid response was notified and they spoke with neurology who requested a stat CT scan of the brain. This resulted showing no acute intracranial process. I came to see the patient at bedside following the CT scan. He states that the right facial numbness is now resolved. In fact he has no current medical complaints. Vital signs. Temperature 98.4, pulse 83, respiration 15, blood pressure 124/50. O2 sats 96%. General appearance. Obese elderly male who is alert and in no distress. Cardiac. Rate and rhythm regular. Lungs. Breath sounds reduced in all fields. Abdomen. Soft and obese with positive bowel sounds. Neurologic. Cranial nerves II-12 grossly intact. No unilateral or focal defects. Impression/plan. Problem #1. Right facial numbness. Etiology of patient's complaint is unclear. Certainly there are no acute changes noted in CT scan and patient's complaint of symptom has resolved. We'll continue to monitor and contact neurology if any further occurrences.

## 2014-12-09 NOTE — Progress Notes (Signed)
SUBJECTIVE: Had facial numbness last night with no acute findings on head CT. Says breathing is ok. Complains of sinus congestion. Denies chills and cough.     Intake/Output Summary (Last 24 hours) at 12/09/14 9150 Last data filed at 12/09/14 0600  Gross per 24 hour  Intake   1143 ml  Output   2225 ml  Net  -1082 ml    Current Facility-Administered Medications  Medication Dose Route Frequency Provider Last Rate Last Dose  . 0.9 %  sodium chloride infusion  250 mL Intravenous PRN Samuella Cota, MD      . acetaminophen (TYLENOL) suppository 650 mg  650 mg Rectal Q4H PRN Erlene Quan, PA-C   650 mg at 12/06/14 1244  . acetaminophen (TYLENOL) tablet 650 mg  650 mg Oral Q4H PRN Samuella Cota, MD   650 mg at 12/08/14 0220  . albuterol (PROVENTIL) (2.5 MG/3ML) 0.083% nebulizer solution 2.5 mg  2.5 mg Nebulization Q4H PRN Geradine Girt, DO   2.5 mg at 12/07/14 0234  . antiseptic oral rinse (CPC / CETYLPYRIDINIUM CHLORIDE 0.05%) solution 7 mL  7 mL Mouth Rinse BID Phillips Climes, MD   7 mL at 12/08/14 2200  . aspirin EC tablet 325 mg  325 mg Oral Daily Samuella Cota, MD   325 mg at 12/08/14 0919  . atorvastatin (LIPITOR) tablet 40 mg  40 mg Oral q1800 Samuella Cota, MD   40 mg at 12/08/14 1738  . budesonide-formoterol (SYMBICORT) 160-4.5 MCG/ACT inhaler 2 puff  2 puff Inhalation BID Samuella Cota, MD   2 puff at 12/09/14 0840  . cloNIDine (CATAPRES) tablet 0.2 mg  0.2 mg Oral TID Samuella Cota, MD   0.2 mg at 12/08/14 2211  . desmopressin (DDAVP) tablet 0.2 mg  0.2 mg Oral BID Samuella Cota, MD   0.2 mg at 12/08/14 2211  . dexamethasone (DECADRON) tablet 0.5 mg  0.5 mg Oral Daily Samuella Cota, MD   0.5 mg at 12/08/14 0954  . furosemide (LASIX) injection 80 mg  80 mg Intravenous BID Donato Heinz, MD   80 mg at 12/09/14 0847  . heparin injection 5,000 Units  5,000 Units Subcutaneous 3 times per day Samuella Cota, MD   5,000 Units at 12/09/14  0636  . insulin aspart (novoLOG) injection 0-9 Units  0-9 Units Subcutaneous TID WC Samuella Cota, MD   1 Units at 12/09/14 [phone]  . levothyroxine (SYNTHROID, LEVOTHROID) tablet 175 mcg  175 mcg Oral QAC breakfast Samuella Cota, MD   175 mcg at 12/09/14 0636  . ondansetron (ZOFRAN) injection 4 mg  4 mg Intravenous Q6H PRN Samuella Cota, MD   4 mg at 12/08/14 2305  . phenytoin (DILANTIN) ER capsule 200 mg  200 mg Oral Once per day on Mon Thu Dawood Elgergawy, MD   200 mg at 12/07/14 0901  . phenytoin (DILANTIN) ER capsule 300 mg  300 mg Oral Once per day on Sun Tue Wed Fri Sat Phillips Climes, MD   300 mg at 12/08/14 0919  . piperacillin-tazobactam (ZOSYN) IVPB 3.375 g  3.375 g Intravenous Q8H Saundra Shelling, RPH   3.375 g at 12/09/14 0336  . sodium chloride 0.9 % injection 3 mL  3 mL Intravenous Q12H Samuella Cota, MD   3 mL at 12/08/14 2213  . sodium chloride 0.9 % injection 3 mL  3 mL Intravenous PRN Samuella Cota, MD  3 mL at 12/06/14 2236  . tiotropium (SPIRIVA) inhalation capsule 18 mcg  18 mcg Inhalation Daily Geradine Girt, DO   18 mcg at 12/09/14 0839  . vancomycin (VANCOCIN) 1,750 mg in sodium chloride 0.9 % 500 mL IVPB  1,750 mg Intravenous Q48H Saundra Shelling, RPH   1,750 mg at 12/08/14 1325    Filed Vitals:   12/08/14 2134 12/09/14 0000 12/09/14 0620 12/09/14 0840  BP: 124/50  121/41   Pulse: 83  84   Temp: 97.6 F (36.4 C) 98.4 F (36.9 C) 97.7 F (36.5 C)   TempSrc: Oral  Oral   Resp: 15  28   Height:      Weight:   274 lb 12.8 oz (124.648 kg)   SpO2: 96%  97% 98%    PHYSICAL EXAM General: NAD, sitting up in chair comfortably. HEENT: Normal. Neck: No JVD, no thyromegaly.  Lungs: Clear to auscultation bilaterally with normal respiratory effort. CV: Nondisplaced PMI. Regular rate and rhythm, normal S1/S2, no S5/K5, 3/4 holodiastolic murmur along lower left sternal border and 2/6 ejection systolic murmur over RUSB. 1-2+ pitting pretibial edema with  stasis dermatitis.  Abdomen: Soft, nontender, obese.  Neurologic: Alert and oriented x 3.  Psych: Normal affect. Musculoskeletal: No gross deformities. Extremities: No clubbing or cyanosis.   TELEMETRY: Reviewed telemetry pt in normal sinus rhythm with PVC's and 5-beat run of NSVT earlier this morning.  LABS: Basic Metabolic Panel:  Recent Labs  12/08/14 0455 12/09/14 0400  NA 136 137  K 4.4 4.3  CL 104 105  CO2 13* 20  GLUCOSE 103* 121*  BUN 60* 65*  CREATININE 2.97* 2.85*  CALCIUM 8.5 8.3*  PHOS 3.8 3.9   Liver Function Tests:  Recent Labs  12/08/14 0455 12/09/14 0400  ALBUMIN 3.1* 3.1*   No results for input(s): LIPASE, AMYLASE in the last 72 hours. CBC:  Recent Labs  12/08/14 0455  WBC 7.1  HGB 11.3*  HCT 36.5*  MCV 86.7  PLT 179   Cardiac Enzymes: No results for input(s): CKTOTAL, CKMB, CKMBINDEX, TROPONINI in the last 72 hours. BNP: Invalid input(s): POCBNP D-Dimer: No results for input(s): DDIMER in the last 72 hours. Hemoglobin A1C: No results for input(s): HGBA1C in the last 72 hours. Fasting Lipid Panel: No results for input(s): CHOL, HDL, LDLCALC, TRIG, CHOLHDL, LDLDIRECT in the last 72 hours. Thyroid Function Tests: No results for input(s): TSH, T4TOTAL, T3FREE, THYROIDAB in the last 72 hours.  Invalid input(s): FREET3 Anemia Panel: No results for input(s): VITAMINB12, FOLATE, FERRITIN, TIBC, IRON, RETICCTPCT in the last 72 hours.  RADIOLOGY: Ct Abdomen Pelvis Wo Contrast  12/06/2014   CLINICAL DATA:  Right upper quadrant pain and fever with fluid retention that has worsened the last 3 days.  EXAM: CT ABDOMEN AND PELVIS WITHOUT CONTRAST  TECHNIQUE: Multidetector CT imaging of the abdomen and pelvis was performed following the standard protocol without IV contrast.  COMPARISON:  August 08, 2014  FINDINGS: The gallbladder is distended with indistinct borders suggesting possible inflammation. No gallstones are noted. There is minimal  ascites in the bilateral pericolic gutter and in the upper pelvis.  The liver, spleen, pancreas and adrenal glands are normal. The patient is status post prior right nephrectomy. There are several small lesions in the left kidney some are high density in the medial lower pole unchanged compared to prior CT. Evaluation is limited without contrast. There is no small bowel obstruction or diverticulitis. There is a small hiatal hernia.  Partial  decompressed bladder is unremarkable. There is no pelvic lymphadenopathy. There is a small right pleural effusion with adjacent compression atelectasis of the posterior right lung base. Degenerative joint changes of the spine are identified.  IMPRESSION: The gallbladder is distended with indistinct borders suggesting possible inflammation. No gallstones are noted. Differential possibility includes acalculus cholecystitis.   Electronically Signed   By: Abelardo Diesel M.D.   On: 12/06/2014 14:32   Ct Head Wo Contrast  12/08/2014   CLINICAL DATA:  Initial evaluation for right-sided facial numbness.  EXAM: CT HEAD WITHOUT CONTRAST  TECHNIQUE: Contiguous axial images were obtained from the base of the skull through the vertex without intravenous contrast.  COMPARISON:  Prior MRI from 05/27/2010.  FINDINGS: Encephalomalacia within the anterior inferior left frontal lobe most likely postsurgical in nature. Dural thickening with deformity of the calvarium also likely related to prior surgery. Prominent vascular calcifications present within the carotid siphons and distal vertebral arteries.  Diffuse prominence of the CSF containing spaces compatible with generalized atrophy. No acute large vessel territory infarct. No mass lesion or midline shift. No hydrocephalus. No extra-axial fluid collection.  Scalp soft tissues within normal limits. No acute abnormality seen about the orbits.  Paranasal sinuses are clear.  No mastoid effusion.  IMPRESSION: 1. No acute intracranial process. 2.  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   Electronically Signed   By: Jeannine Boga M.D.   On: 12/08/2014 23:58   Nm Hepato W/eject Fract  12/05/2014   CLINICAL DATA:  69 year old with nausea and vomiting  EXAM: NUCLEAR MEDICINE HEPATOBILIARY IMAGING WITH GALLBLADDER EF  TECHNIQUE: Sequential images of the abdomen were obtained out to 60 minutes following intravenous administration of radiopharmaceutical. After slow intravenous infusion of 2.51 mcg micrograms Cholecystokinin, gallbladder ejection fraction was determined.  RADIOPHARMACEUTICALS:  5.0 [place] CH-88F Choletec  COMPARISON:  None.  FINDINGS: There is normal excretion radiotracer into the gallbladder and small bowel.  At 30 min, normal ejection fraction is greater than 30% (actually ejection fraction was 60%).  The patient did not experience symptoms during CCK infusion.  IMPRESSION: No evidence of acute cholecystitis.   Electronically Signed   By: Rosemarie Ax   On: 12/05/2014 15:37   Dg Chest Port 1 View  12/06/2014   CLINICAL DATA:  New onset fever today. History of CHF, diabetes, hypertension, COPD, lower leg cellulitis, sleep apnea, kidney disease, and seizures.  EXAM: PORTABLE CHEST - 1 VIEW  COMPARISON:  12/04/2014  FINDINGS: Enlargement of the cardiac silhouette is unchanged. Prominence of the superior mediastinum is likely secondary to technique. Vascular congestion and interstitial opacities have slightly increased compared to the prior study. There may be small bilateral pleural effusions. No segmental airspace consolidation or pneumothorax is seen.  IMPRESSION: Slightly increased pulmonary edema.   Electronically Signed   By: Logan Bores   On: 12/06/2014 13:09   Dg Chest Portable 1 View  12/04/2014   CLINICAL DATA:  Shortness of breath.  Chest pain.  EXAM: PORTABLE CHEST - 1 VIEW  COMPARISON:  06/11/2014  FINDINGS:  Stable cardiomegaly. Vascular congestion and mild interstitial edema, similar to prior exam. There is blunting of both costophrenic angles, likely small effusions. No definite confluent airspace disease. No pneumothorax.  IMPRESSION: Findings consistent with mild CHF.   Electronically Signed   By: Jeb Levering M.D.   On: 12/04/2014 10:45   US Abdomen Limited Ruq  12/04/2014   CLINICAL DATA:  Abdominal pain for 2 days.  Prior right nephrectomy.  EXAM: US ABDOMEN LIMITED - RIGHT UPPER QUADRANT  COMPARISON:  08/08/2014 CT abdomen and pelvis  FINDINGS: Gallbladder:  Hydropic gallbladder measuring approximately 6 cm in diameter and 12 cm in length. There may be a small amount of pericholecystic fluid. Gallbladder wall is not thickened, measuring 2 mm. There is report of a positive sonographic Murphy's sign, although the patient complained of pain over the gallbladder and pancreas.  Common bile duct:  Diameter: 4.5 mm  Liver:  Mildly enlarged, with a length of 17.2 cm. No focal lesion identified. Within normal limits in parenchymal echogenicity.  Limited imaging of the pancreas demonstrated a diffusely heterogeneous and hypoechoic echotexture. Small right pleural effusion was noted.  IMPRESSION: 1. Hydropic gallbladder. No gallstones or gallbladder wall thickening, however possible positive sonographic Murphy's sign and trace pericholecystic fluid are present and acalculous cholecystitis is not excluded. Nuclear medicine HIDA scan could be performed to evaluate for cystic duct obstruction as clinically indicated. 2. No biliary dilatation. 3. Heterogeneous, hypoechoic pancreas which may reflect pancreatitis. Correlation with serum lipase level recommended. 4. Small right pleural effusion.   Electronically Signed   By: Logan Bores   On: 12/04/2014 12:02      ASSESSMENT AND PLAN: 1. Acute on chronic combined systolic and diastolic heart failure: Approximately 1 L output in last 24 hours on Lasix 80 mg IV bid.  Weight 276 lbs-->L274 lbs. Will continue. Will eventually require aortic valve replacement for severe regurgitation and decline in LV function, complicated by CKD which may ultimately require hemodialysis. Left and right heart catheterization with coronary angiography on hold for time being, possibly Monday (1/4).   2. Sepsis: Currently on vancomycin and Zosyn. Blood cultures negative. WBC normal. Management per hospitalist service.   3. Acute on chronic renal insufficiency, CKD stage 4: Renal following. May ultimately require hemodialysis. As per nephrology, function not likely to significantly improve with recommendation to continue diuresis.   4. Essential HTN: Controlled on present therapy which includes clonidine. On amlodipine as outpatient.   5. Abdominal pain, nausea, vomiting, fever: HIDA negative. On Abx.   6. Chest pain: Resolved. Will eventually need coronary angiography given decline in LV function.  7. NSVT: Asymptomatic at time. K normal. Will check serum magnesium.    Kate Sable, M.D., F.A.C.C.

## 2014-12-10 DIAGNOSIS — G4730 Sleep apnea, unspecified: Secondary | ICD-10-CM

## 2014-12-10 DIAGNOSIS — E038 Other specified hypothyroidism: Secondary | ICD-10-CM

## 2014-12-10 LAB — BLOOD GAS, ARTERIAL
Acid-base deficit: 6.2 mmol/L — ABNORMAL HIGH (ref 0.0–2.0)
Bicarbonate: 17.9 mEq/L — ABNORMAL LOW (ref 20.0–24.0)
Drawn by: 36527
O2 Content: 2 L/min
O2 Saturation: 94.9 %
PATIENT TEMPERATURE: 98.6
PH ART: 7.38 (ref 7.350–7.450)
TCO2: 18.9 mmol/L (ref 0–100)
pCO2 arterial: 31 mmHg — ABNORMAL LOW (ref 35.0–45.0)
pO2, Arterial: 84 mmHg (ref 80.0–100.0)

## 2014-12-10 LAB — RENAL FUNCTION PANEL
ALBUMIN: 3.4 g/dL — AB (ref 3.5–5.2)
Anion gap: 11 (ref 5–15)
BUN: 66 mg/dL — ABNORMAL HIGH (ref 6–23)
CHLORIDE: 105 meq/L (ref 96–112)
CO2: 21 mmol/L (ref 19–32)
Calcium: 8.7 mg/dL (ref 8.4–10.5)
Creatinine, Ser: 2.74 mg/dL — ABNORMAL HIGH (ref 0.50–1.35)
GFR calc non Af Amer: 22 mL/min — ABNORMAL LOW (ref >90)
GFR, EST AFRICAN AMERICAN: 25 mL/min — AB (ref >90)
Glucose, Bld: 96 mg/dL (ref 70–99)
Phosphorus: 4.1 mg/dL (ref 2.3–4.6)
Potassium: 4.5 mmol/L (ref 3.5–5.1)
Sodium: 137 mmol/L (ref 135–145)

## 2014-12-10 LAB — IRON AND TIBC
IRON: 23 ug/dL — AB (ref 42–165)
Saturation Ratios: 12 % — ABNORMAL LOW (ref 20–55)
TIBC: 197 ug/dL — AB (ref 215–435)
UIBC: 174 ug/dL (ref 125–400)

## 2014-12-10 LAB — VITAMIN B12: Vitamin B-12: 789 pg/mL (ref 211–911)

## 2014-12-10 LAB — FOLATE RBC: RBC FOLATE: 762 ng/mL — AB (ref >280)

## 2014-12-10 LAB — CLOSTRIDIUM DIFFICILE BY PCR: Toxigenic C. Difficile by PCR: NEGATIVE

## 2014-12-10 LAB — FERRITIN: Ferritin: 134 ng/mL (ref 22–322)

## 2014-12-10 LAB — PSA: PSA: 2.02 ng/mL (ref <=4.00)

## 2014-12-10 LAB — GLUCOSE, CAPILLARY
GLUCOSE-CAPILLARY: 132 mg/dL — AB (ref 70–99)
GLUCOSE-CAPILLARY: 89 mg/dL (ref 70–99)
Glucose-Capillary: 113 mg/dL — ABNORMAL HIGH (ref 70–99)
Glucose-Capillary: 163 mg/dL — ABNORMAL HIGH (ref 70–99)

## 2014-12-10 LAB — URINE CULTURE
Colony Count: NO GROWTH
Culture: NO GROWTH
Special Requests: NORMAL

## 2014-12-10 MED ORDER — ONDANSETRON HCL 4 MG PO TABS
4.0000 mg | ORAL_TABLET | Freq: Three times a day (TID) | ORAL | Status: DC | PRN
  Administered 2014-12-10 – 2014-12-11 (×2): 4 mg via ORAL
  Filled 2014-12-10 (×2): qty 1

## 2014-12-10 MED ORDER — MAGIC MOUTHWASH
5.0000 mL | Freq: Three times a day (TID) | ORAL | Status: DC | PRN
  Administered 2014-12-10: 5 mL via ORAL
  Filled 2014-12-10 (×3): qty 5

## 2014-12-10 NOTE — Progress Notes (Signed)
Patient ID: [name] [name], male   DOB: [birth year], 69 y.o.   MRN: [id number] S: no new complaints O:BP 129/55 mmHg  Pulse 82  Temp(Src) 98 F (36.7 C) (Oral)  Resp 13  Ht 6\' 1"  (1.854 m)  Wt 124.331 kg (274 lb 1.6 oz)  BMI 36.17 kg/m2  SpO2 98%  Intake/Output Summary (Last 24 hours) at 12/10/14 1133 Last data filed at 12/10/14 0900  Gross per 24 hour  Intake   1440 ml  Output    700 ml  Net    740 ml   Intake/Output: I/O last 3 completed shifts: In: 2463 [P.O.:2160; I.V.:3; IV Piggyback:300] Out: 4920 [Urine:1825]  Intake/Output this shift:  Total I/O In: 240 [P.O.:240] Out: -  Weight change: -0.318 kg (-11.2 oz) Gen:WD obese [name] in NAD, sitting in chair CVS:no rub Resp:bibasilar crackles FEO:FHQRFX Ext:1+ edema   Recent Labs Lab 12/04/14 0959 12/04/14 1809 12/05/14 1351 12/06/14 0319 12/07/14 0420 12/08/14 0455 12/09/14 0400 12/10/14 0315  NA 139  --  140 138 134* 136 137 137  K 4.7  --  4.2 4.7 4.2 4.4 4.3 4.5  CL 104  --  105 103 103 104 105 105  CO2 29  --  26 26 23  13* 20 21  GLUCOSE 108*  --  107* 90 95 103* 121* 96  BUN 53*  --  55* 54* 56* 60* 65* 66*  CREATININE 2.37* 2.40* 2.66* 2.69* 3.04* 2.97* 2.85* 2.74*  ALBUMIN 3.5  --   --  3.3*  --  3.1* 3.1* 3.4*  CALCIUM 8.5  --  8.8 8.7 8.2* 8.5 8.3* 8.7  PHOS  --   --   --   --   --  3.8 3.9 4.1  AST 18  --   --  17  --   --   --   --   ALT 15  --   --  12  --   --   --   --    Liver Function Tests:  Recent Labs Lab 12/04/14 0959 12/06/14 0319 12/08/14 0455 12/09/14 0400 12/10/14 0315  AST 18 17  --   --   --   ALT 15 12  --   --   --   ALKPHOS 68 55  --   --   --   BILITOT 0.5 0.8  --   --   --   PROT 6.7 6.7  --   --   --   ALBUMIN 3.5 3.3* 3.1* 3.1* 3.4*    Recent Labs Lab 12/04/14 0959 12/06/14 0319  LIPASE 61* 47   No results for input(s): AMMONIA in the last 168 hours. CBC:  Recent Labs Lab 12/04/14 0959 12/04/14 1809 12/06/14 0319 12/08/14 0455  WBC 7.2 6.3 6.1 7.1   NEUTROABS 4.4  --   --   --   HGB 12.0* 11.7* 11.4* 11.3*  HCT 39.4 37.9* 38.2* 36.5*  MCV 89.5 87.5 88.4 86.7  PLT 158 177 183 179   Cardiac Enzymes:  Recent Labs Lab 12/04/14 0959 12/04/14 1225 12/04/14 1809 12/04/14 2312 12/05/14 0550  TROPONINI 0.04* 0.04* 0.04* 0.03 0.03   CBG:  Recent Labs Lab 12/09/14 0753 12/09/14 1139 12/09/14 1634 12/09/14 2110 12/10/14 0737  GLUCAP 126* 101* 96 104* 132*    Iron Studies: No results for input(s): IRON, TIBC, TRANSFERRIN, FERRITIN in the last 72 hours. Studies/Results:  . antiseptic oral rinse  7 mL Mouth Rinse BID  . aspirin EC  325 mg Oral Daily  . atorvastatin  40 mg Oral q1800  . budesonide-formoterol  2 puff Inhalation BID  . ciprofloxacin  500 mg Oral BID  . cloNIDine  0.2 mg Oral TID  . desmopressin  0.2 mg Oral BID  . dexamethasone  0.5 mg Oral Daily  . heparin  5,000 Units Subcutaneous 3 times per day  . insulin aspart  0-9 Units Subcutaneous TID WC  . levothyroxine  175 mcg Oral QAC breakfast  . metroNIDAZOLE  500 mg Oral 3 times per day  . phenytoin  200 mg Oral Once per day on Mon Thu  . phenytoin  300 mg Oral Once per day on Sun Tue Wed Fri Sat  . sodium chloride  3 mL Intravenous Q12H  . tiotropium  18 mcg Inhalation Daily    BMET    Component Value Date/Time   NA 137 12/10/2014 0315   K 4.5 12/10/2014 0315   CL 105 12/10/2014 0315   CO2 21 12/10/2014 0315   GLUCOSE 96 12/10/2014 0315   BUN 66* 12/10/2014 0315   CREATININE 2.74* 12/10/2014 0315   CREATININE 2.58* 11/21/2014 0914   CALCIUM 8.7 12/10/2014 0315   CALCIUM 8.5 09/09/2012 0536   GFRNONAA 22* 12/10/2014 0315   GFRNONAA 24* 11/21/2014 0914   GFRAA 25* 12/10/2014 0315   GFRAA 28* 11/21/2014 0914   CBC    Component Value Date/Time   WBC 7.1 12/08/2014 0455   RBC 4.21* 12/08/2014 0455   HGB 11.3* 12/08/2014 0455   HCT 36.5* 12/08/2014 0455   PLT 179 12/08/2014 0455   MCV 86.7 12/08/2014 0455   MCH 26.8 12/08/2014 0455   MCHC  31.0 12/08/2014 0455   RDW 17.1* 12/08/2014 0455   LYMPHSABS 1.2 12/04/2014 0959   MONOABS 0.8 12/04/2014 0959   EOSABS 0.8* 12/04/2014 0959   BASOSABS 0.1 12/04/2014 0959     Assessment/Plan: 1. AKI/CKD-  1. Responding well to diuresis and stable GFR 2. Limit contrast exposure and would hold lasix 24 hours prior to cardiac cath to help minimize risk 2. Acute on chronic CHF (severe aortic insufficiency and EF 40-45%)- marked volume overload, 1. Continue with IV lasix bid and follow 3. Severe Aortic insufficiency- work up per Drs. Cooper/Skains 4. HTN- stable 5. 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 Urinary hesitancy/retention- bladder scan initially was 261 yesterday but only 88cc today.   8. Morbid obesity 9. COPD 10. Anemia of chronic disease- will follow H/H and iron stores  [name] [name], B

## 2014-12-10 NOTE — Progress Notes (Signed)
Physical Therapy Evaluation Patient Details Name: [name] [name] MRN: [id number] DOB: 03/04/1946 Today's Date: 12/10/2014   History of Present Illness  Patient is a 69 yo male admitted 12/04/14 with CP, SOB, fever, RUQ pain.  Patient with sepsis, CHF, CKD, cholecystitis.  PMH:  CKD, HTN, DM, severe aortic insufficiency, declining LV function, obesity, OSA.  Clinical Impression  Patient presents with problems listed below.  Will benefit from acute PT to maximize functional mobility prior to d/c.  Patient very lethargic during session, falling asleep mid-sentence several times.  O2 sats dropped to 88% during these periods of lethargy.  O2 sats increased to 98% during ambulation. Patient lives alone.  Currently requires assist for mobility and gait.  Recommend ST-SNF at discharge for continued therapy.    Follow Up Recommendations SNF;Supervision/Assistance - 24 hour    Equipment Recommendations  3in1 (PT)    Recommendations for Other Services       Precautions / Restrictions Precautions Precautions: Fall Restrictions Weight Bearing Restrictions: No      Mobility  Bed Mobility               General bed mobility comments: Patient stays in recliner.  Unable to lay flat.  Transfers Overall transfer level: Needs assistance Equipment used: Rolling walker (2 wheeled) Transfers: Sit to/from Stand Sit to Stand: Min assist         General transfer comment: Verbal cues for hand placement.  Assist to rise to standing from recliner and for balance initially.  Assist to control descent into chair to return to sitting.  Ambulation/Gait Ambulation/Gait assistance: Min assist Ambulation Distance (Feet): 10 Feet Assistive device: Rolling walker (2 wheeled) Gait Pattern/deviations: Step-through pattern;Decreased stride length;Shuffle;Trunk flexed Gait velocity: Decreased Gait velocity interpretation: Below normal speed for age/gender General Gait Details: Verbal cues for safe use of  RW.  Assist with gait for balance and safety.  Stairs            Wheelchair Mobility    Modified Rankin (Stroke Patients Only)       Balance Overall balance assessment: Needs assistance         Standing balance support: Bilateral upper extremity supported Standing balance-Leahy Scale: Poor                               Pertinent Vitals/Pain Pain Assessment: No/denies pain    Home Living Family/patient expects to be discharged to:: Skilled nursing facility Living Arrangements: Alone             Home Equipment: Gilford Rile - 2 wheels Additional Comments: Patient reports he wants to go to [facility]    Prior Function Level of Independence: Independent         Comments: Does not drive     Hand Dominance        Extremity/Trunk Assessment   Upper Extremity Assessment: Overall WFL for tasks assessed           Lower Extremity Assessment: Generalized weakness         Communication   Communication: No difficulties  Cognition Arousal/Alertness: Lethargic (Patient falling asleep mid-sentence) Behavior During Therapy: Flat affect Overall Cognitive Status: Within Functional Limits for tasks assessed                      General Comments      Exercises        Assessment/Plan    PT Assessment Patient  needs continued PT services  PT Diagnosis Difficulty walking;Abnormality of gait;Generalized weakness   PT Problem List Decreased strength;Decreased activity tolerance;Decreased balance;Decreased mobility;Decreased knowledge of use of DME;Cardiopulmonary status limiting activity;Obesity  PT Treatment Interventions DME instruction;Gait training;Functional mobility training;Therapeutic activities;Therapeutic exercise;Patient/family education   PT Goals (Current goals can be found in the Care Plan section) Acute Rehab PT Goals Patient Stated Goal: To get this (medical workup) finished PT Goal Formulation: With patient Time  For Goal Achievement: 12/17/14 Potential to Achieve Goals: Good    Frequency Min 3X/week   Barriers to discharge Inaccessible home environment;Decreased caregiver support Patient's home has 5 steps without rails to enter.  Patient lives alone.    Co-evaluation               End of Session Equipment Utilized During Treatment: Gait belt;Oxygen Activity Tolerance: Patient limited by fatigue;Patient limited by lethargy Patient left: in chair;with call bell/phone within reach Nurse Communication: Mobility status (Lethargy)         Time: 2330-0762 PT Time Calculation (min) (ACUTE ONLY): 19 min   Charges:   PT Evaluation $Initial PT Evaluation Tier I: 1 Procedure PT Treatments $Therapeutic Activity: 8-22 mins   PT G Codes:        [name] [name] 12-24-2014, 6:58 PM [name] [name]. Sanjuana Kava, [name] [name] Pager [phone]

## 2014-12-10 NOTE — Progress Notes (Signed)
Pt stated he does not wear CPAP at home and does not want to wear it tonight.

## 2014-12-10 NOTE — Progress Notes (Signed)
Patient Demographics  [name] [name], is a 69 y.o. male, DOB - 30-May-1946, ULA:[id number]  Admit date - 12/04/2014   Admitting Physician [name] Sine, MD  Outpatient Primary MD for the patient is [name] Fire, MD  LOS - 6   Chief Complaint  Patient presents with  . Shortness of Breath      Admission history of present illness/brief narrative: 69 year-old gentleman presenting to the Emergency Dept today with multiple complaints. These include chest pain, shortness of breath, and RUQ pain. He has been previously scheduled for elective cardiac catheterization 12/29 to evaluate severe aortic insufficiency and declining LV function. However, the patient came to the emergency department today because he has noticed an acute change in his symptoms over the last 24 hours. He complains of aching all over. He also has had some nausea and vomiting. He complains of pain in his upper abdominal area. He has also had chest pain. His breathing has been worse over the last 2-3 days. He does feel that his "retaining more fluid." He admits to dietary indiscretion with salt. He has been taking his medications. Right upper quadrant ultrasound was done and there concern about acalculous cholecystitis general surgery recommended HIDA scan which was negative. Pain improved on PO abx.  Patient awaiting cath over the weekend.   Subjective:   [name] [name]  Sitting in chair- no complaints   Assessment & Plan    Principal Problem:   Sepsis Active Problems:   Obese   Hypertension   Sleep apnea   Hypothyroid   Panhypopituitarism   Moderate aortic regurgitation   Diabetes   CKD (chronic kidney disease), stage IV   Acalculous cholecystitis   Abdominal pain   Acute on chronic combined systolic and diastolic CHF (congestive heart failure)   SOB (shortness of breath)   Fever   Chest pain: - Cardiac  consult- cath at some point- will need renal maximization  -will d/c lasix today as cath planned for AM--- WILL NEED TO RESUME AFTER  Abdominal pain, nausea, vomiting, -Questionable acalculous cholecystitis on ultrasound, HIDA negative -tolerating food  Fever -BC x2 NGTD -vanc/zosyn- transition over to PO cipro/flagyl for a 10 days course -CT scan abd show inflammation- tender in RUQ is gone after ABX -outpatient surgery follow up (they have signed off and plan no intervention currently)  Acute on chronic systolic/diastolic heart failure,  -LVEF 40-45 percent with diffuse hypokinesis, diastolic dysfunction, moderate to severe aortic regurgitation, severe pulmonary hypertension by echocardiogram 12/10. TEE and heart catheter suggested at that time for w/u for aortic regurgitation. - Evidence of volume overload but no evidence of pulmonary compromise. Further management and diuresis per cardiology. Lasix per cards -Will need right and left heart cath and likely need aortic valve replacement in the setting of severe aortic insufficiency as per cardiology  Panhypopituitarism -Status post pituitary excision, continue with desmopressin, Decadron, Synthroid.  Seizure disorder -Status post craniectomy, continue with phenytoin.  Diabetes mellitus -Acceptable, continue with insulin sliding scale.  COPD -No active wheezing, on 2 L at baseline, appears stable  chronic renal failure-stage IV -Appears to be at baseline, continue to monitor  -appreciate renal input  PT eval  Code Status: Full  Family Communication: spoke with daughter  on phone  Disposition Plan: Remains on telemetry   Procedures  None   Consults   Cardiology Surgery   Medications  Scheduled Meds: . antiseptic oral rinse  7 mL Mouth Rinse BID  . aspirin EC  325 mg Oral Daily  . atorvastatin  40 mg Oral q1800  . budesonide-formoterol  2 puff Inhalation BID  . ciprofloxacin  500 mg Oral BID  . cloNIDine   0.2 mg Oral TID  . desmopressin  0.2 mg Oral BID  . dexamethasone  0.5 mg Oral Daily  . furosemide  80 mg Intravenous BID  . heparin  5,000 Units Subcutaneous 3 times per day  . insulin aspart  0-9 Units Subcutaneous TID WC  . levothyroxine  175 mcg Oral QAC breakfast  . metroNIDAZOLE  500 mg Oral 3 times per day  . phenytoin  200 mg Oral Once per day on Mon Thu  . phenytoin  300 mg Oral Once per day on Sun Tue Wed Fri Sat  . sodium chloride  3 mL Intravenous Q12H  . tiotropium  18 mcg Inhalation Daily   Continuous Infusions:  PRN Meds:.sodium chloride, acetaminophen, acetaminophen, albuterol, ondansetron (ZOFRAN) IV, sodium chloride  DVT Prophylaxis   Heparin -  Lab Results  Component Value Date   PLT 179 12/08/2014    Antibiotics    Anti-infectives    Start     Dose/Rate Route Frequency Ordered Stop   12/09/14 1400  metroNIDAZOLE (FLAGYL) tablet 500 mg     500 mg Oral 3 times per day 12/09/14 1111     12/09/14 1200  amoxicillin-clavulanate (AUGMENTIN) 875-125 MG per tablet 1 tablet  Status:  Discontinued     1 tablet Oral Every 12 hours 12/09/14 1111 12/09/14 1124   12/09/14 1200  ciprofloxacin (CIPRO) tablet 500 mg     500 mg Oral 2 times daily 12/09/14 1124     12/08/14 0900  vancomycin (VANCOCIN) 1,750 mg in sodium chloride 0.9 % 500 mL IVPB  Status:  Discontinued     1,750 mg250 mL/hr over 120 Minutes Intravenous Every 48 hours 12/06/14 0753 12/09/14 1111   12/06/14 1500  piperacillin-tazobactam (ZOSYN) IVPB 3.375 g  Status:  Discontinued     3.375 g12.5 mL/hr over 240 Minutes Intravenous Every 8 hours 12/06/14 0748 12/09/14 1111   12/06/14 0800  piperacillin-tazobactam (ZOSYN) IVPB 3.375 g     3.375 g100 mL/hr over 30 Minutes Intravenous  Once 12/06/14 0747 12/06/14 0921   12/06/14 0800  vancomycin (VANCOCIN) 2,000 mg in sodium chloride 0.9 % 500 mL IVPB     2,000 mg250 mL/hr over 120 Minutes Intravenous  Once 12/06/14 0747 12/06/14 1211   12/06/14 0700  cefTRIAXone  (ROCEPHIN) 1 g in dextrose 5 % 50 mL IVPB - Premix  Status:  Discontinued     1 g100 mL/hr over 30 Minutes Intravenous  Once 12/06/14 0658 12/06/14 0748   12/04/14 1830  cefTRIAXone (ROCEPHIN) 1 g in dextrose 5 % 50 mL IVPB - Premix  Status:  Discontinued     1 g100 mL/hr over 30 Minutes Intravenous Every 24 hours 12/04/14 1727 12/05/14 1717          Objective:   Filed Vitals:   12/09/14 1801 12/09/14 2112 12/10/14 0641 12/10/14 0954  BP: 133/48 127/50 129/55   Pulse:  82 82   Temp:  98 F (36.7 C) 98 F (36.7 C)   TempSrc:  Oral Oral   Resp:   13   Height:  Weight:   124.331 kg (274 lb 1.6 oz)   SpO2:  97% 95% 98%    Wt Readings from Last 3 Encounters:  12/10/14 124.331 kg (274 lb 1.6 oz)  11/16/14 127.007 kg (280 lb)  06/12/14 121.201 kg (267 lb 3.2 oz)     Intake/Output Summary (Last 24 hours) at 12/10/14 1100 Last data filed at 12/10/14 0900  Gross per 24 hour  Intake   1440 ml  Output    700 ml  Net    740 ml     Physical Exam  Awake Alert, Oriented X 3, No new F.N deficits, flat affect Symmetrical Chest wall movement, Good air movement bilaterally, CTAB RRR,No Gallops,Rubs or new Murmurs, No Parasternal Heave +ve B.Sounds, obese +edema   Data Review   Micro Results Recent Results (from the past 240 hour(s))  MRSA PCR Screening     Status: None   Collection Time: 12/04/14  5:44 PM  Result Value Ref Range Status   MRSA by PCR NEGATIVE NEGATIVE Final    Comment:        The GeneXpert MRSA Assay (FDA approved for NASAL specimens only), is one component of a comprehensive MRSA colonization surveillance program. It is not intended to diagnose MRSA infection nor to guide or monitor treatment for MRSA infections.   Culture, blood (routine x 2)     Status: None (Preliminary result)   Collection Time: 12/06/14  8:45 AM  Result Value Ref Range Status   Specimen Description BLOOD LEFT ARM  Final   Special Requests BOTTLES DRAWN AEROBIC AND  ANAEROBIC 10CC  Final   Culture   Final           BLOOD CULTURE RECEIVED NO GROWTH TO DATE CULTURE WILL BE HELD FOR 5 DAYS BEFORE ISSUING A FINAL NEGATIVE REPORT Note: Culture results may be compromised due to an inadequate volume of blood received in culture bottles. Performed at [facility]    Report Status PENDING  Incomplete  Culture, blood (routine x 2)     Status: None (Preliminary result)   Collection Time: 12/06/14  8:50 AM  Result Value Ref Range Status   Specimen Description BLOOD RIGHT HAND  Final   Special Requests BOTTLES DRAWN AEROBIC ONLY 10CC  Final   Culture   Final           BLOOD CULTURE RECEIVED NO GROWTH TO DATE CULTURE WILL BE HELD FOR 5 DAYS BEFORE ISSUING A FINAL NEGATIVE REPORT Note: Culture results may be compromised due to an excessive volume of blood received in culture bottles. Performed at [facility]    Report Status PENDING  Incomplete  Clostridium Difficile by PCR     Status: None   Collection Time: 12/06/14 10:42 PM  Result Value Ref Range Status   C difficile by pcr NEGATIVE NEGATIVE Final  Clostridium Difficile by PCR     Status: None   Collection Time: 12/10/14  1:19 AM  Result Value Ref Range Status   C difficile by pcr NEGATIVE NEGATIVE Final    Radiology Reports Ct Head Wo Contrast  12/08/2014   CLINICAL DATA:  Initial evaluation for right-sided facial numbness.  EXAM: CT HEAD WITHOUT CONTRAST  TECHNIQUE: Contiguous axial images were obtained from the base of the skull through the vertex without intravenous contrast.  COMPARISON:  Prior MRI from 05/27/2010.  FINDINGS: Encephalomalacia within the anterior inferior left frontal lobe most likely postsurgical in nature. Dural thickening with deformity of the calvarium also likely  related to prior surgery. Prominent vascular calcifications present within the carotid siphons and distal vertebral arteries.  Diffuse prominence of the CSF containing spaces compatible with generalized  atrophy. No acute large vessel territory infarct. No mass lesion or midline shift. No hydrocephalus. No extra-axial fluid collection.  Scalp soft tissues within normal limits. No acute abnormality seen about the orbits.  Paranasal sinuses are clear.  No mastoid effusion.  IMPRESSION: 1. No acute intracranial process. 2. 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   Electronically Signed   By: Jeannine Boga M.D.   On: 12/08/2014 23:58    CBC  Recent Labs Lab 12/04/14 0959 12/04/14 1809 12/06/14 0319 12/08/14 0455  WBC 7.2 6.3 6.1 7.1  HGB 12.0* 11.7* 11.4* 11.3*  HCT 39.4 37.9* 38.2* 36.5*  PLT 158 177 183 179  MCV 89.5 87.5 88.4 86.7  MCH 27.3 27.0 26.4 26.8  MCHC 30.5 30.9 29.8* 31.0  RDW 17.2* 17.2* 17.3* 17.1*  LYMPHSABS 1.2  --   --   --   MONOABS 0.8  --   --   --   EOSABS 0.8*  --   --   --   BASOSABS 0.1  --   --   --     Chemistries   Recent Labs Lab 12/04/14 0959  12/06/14 0319 12/07/14 0420 12/08/14 0455 12/09/14 0400 12/10/14 0315  NA 139  < > 138 134* 136 137 137  K 4.7  < > 4.7 4.2 4.4 4.3 4.5  CL 104  < > 103 103 104 105 105  CO2 29  < > 26 23 13* 20 21  GLUCOSE 108*  < > 90 95 103* 121* 96  BUN 53*  < > 54* 56* 60* 65* 66*  CREATININE 2.37*  < > 2.69* 3.04* 2.97* 2.85* 2.74*  CALCIUM 8.5  < > 8.7 8.2* 8.5 8.3* 8.7  MG  --   --   --   --   --  2.5  --   AST 18  --  17  --   --   --   --   ALT 15  --  12  --   --   --   --   ALKPHOS 68  --  55  --   --   --   --   BILITOT 0.5  --  0.8  --   --   --   --   < > = values in this interval not displayed. ------------------------------------------------------------------------------------------------------------------ estimated creatinine clearance is 34.7 mL/min (by C-G formula based on Cr of  2.74). ------------------------------------------------------------------------------------------------------------------ No results for input(s): HGBA1C in the last 72 hours. ------------------------------------------------------------------------------------------------------------------ No results for input(s): CHOL, HDL, LDLCALC, TRIG, CHOLHDL, LDLDIRECT in the last 72 hours. ------------------------------------------------------------------------------------------------------------------ No results for input(s): TSH, T4TOTAL, T3FREE, THYROIDAB in the last 72 hours.  Invalid input(s): FREET3 ------------------------------------------------------------------------------------------------------------------ No results for input(s): VITAMINB12, FOLATE, FERRITIN, TIBC, IRON, RETICCTPCT in the last 72 hours.  Coagulation profile No results for input(s): INR, PROTIME in the last 168 hours.  No results for input(s): DDIMER in the last 72 hours.  Cardiac Enzymes  Recent Labs Lab 12/04/14 1809 12/04/14 2312 12/05/14 0550  TROPONINI 0.04* 0.03 0.03   ------------------------------------------------------------------------------------------------------------------ Invalid input(s): POCBNP     Time Spent in minutes   25 minutes   Simisola Sandles DO on 12/10/2014 at 11:00 AM  Between 7am to 7pm - Pager - [phone]  After  7pm go to [email or web address] - password [email or web address]  And look for the night coverage person covering for me after hours  [facility] Office  [phone]

## 2014-12-10 NOTE — Progress Notes (Signed)
Subjective:  He denies any pain but looks uncomfortable and can't sit still  Objective:  Vital Signs in the last 24 hours: Temp:  [97.5 F (36.4 C)-98 F (36.7 C)] 98 F (36.7 C) (01/03 0641) Pulse Rate:  [80-82] 82 (01/03 0641) Resp:  [13-16] 13 (01/03 0641) BP: (127-133)/(45-55) 129/55 mmHg (01/03 0641) SpO2:  [92 %-97 %] 95 % (01/03 0641) Weight:  [274 lb 1.6 oz (124.331 kg)] 274 lb 1.6 oz (124.331 kg) (01/03 0641)  Intake/Output from previous day:  Intake/Output Summary (Last 24 hours) at 12/10/14 0955 Last data filed at 12/10/14 0649  Gross per 24 hour  Intake   1200 ml  Output    925 ml  Net    275 ml    Physical Exam: General appearance: alert, cooperative, mild distress and moderately obese Lungs: clear to auscultation bilaterally Heart: regular rate and rhythm and 2/6 AR murmur Extremities: venous stasis dermatitis noted and 2+ edema   Rate: 82  Rhythm: normal sinus rhythm  Lab Results:  Recent Labs  12/08/14 0455  WBC 7.1  HGB 11.3*  PLT 179    Recent Labs  12/09/14 0400 12/10/14 0315  NA 137 137  K 4.3 4.5  CL 105 105  CO2 20 21  GLUCOSE 121* 96  BUN 65* 66*  CREATININE 2.85* 2.74*   No results for input(s): TROPONINI in the last 72 hours.  Invalid input(s): CK, MB No results for input(s): INR in the last 72 hours.  Imaging: Imaging results have been reviewed  Cardiac Studies: Echo 11/16/14 Study Conclusions  - Left ventricle: Abnromal global starin -11.9. The cavity size was mildly dilated. Wall thickness was increased in a pattern of mild LVH. There was mild concentric hypertrophy. Systolic function was mildly to moderately reduced. The estimated ejection fraction was in the range of 40% to 45%. Diffuse hypokinesis. Doppler parameters are consistent with elevated ventricular end-diastolic filling pressure. - Aortic valve: Moderate to severe AR Elevated systolic gradients may be from AR as valve seems to open  well. Trileaflet calcified left coronary cusp. Moderately calcified annulus. Severely thickened leaflets. Valve area (VTI): 2.16 cm^2. Valve area (Vmax): 1.92 cm^2. Valve area (Vmean): 2 cm^2. 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 The visualized portion of the proximal ascending aorta is moderately dilated at 4.7 cm. - Mitral valve: Mildly calcified annulus. Mildly thickened leaflets . There was mild regurgitation. Valve area by pressure half-time: 2.5 cm^2. Valve area by continuity equation (using LVOT flow): 1.92 cm^2. - Left atrium: The atrium was mildly dilated. - Right ventricle: The cavity size was mildly dilated. - Right atrium: The atrium was mildly dilated. - Atrial septum: No defect or patent foramen ovale was identified. - Pulmonary arteries: PA peak pressure: 60 mm Hg (S). - Impressions: Given abnormla LV function elevated EDP and significant AR consider further w/u to include TEE and heart cath.  Impressions:  - Given abnormla LV function elevated EDP and significant AR consider further w/u to include TEE and heart cath.  Assessment/Plan:  69 year-old AA male with known moderate to severe AR, CRI -4, and DM admitted through the ER 12/04/14 with multiple complaints. These include chest pain, shortness of breath, fever, and RUQ pain. He has been previously scheduled for elective cardiac catheterization 12/29 to evaluate severe aortic insufficiency and declining LV function. However, the patient came to the emergency department  because he has noticed an acute change in his symptoms over the  previous 24 hours. Right upper quadrant ultrasound was done, there concern about acalculous cholecystitis. Abdominal CT showed distended GB and inflammation. HIDA scan was negative. He has had acute on chronic diastolic CHF. I/O negative 4.5L since admission.    Principal Problem:   Sepsis Active Problems:   Acalculous  cholecystitis   Acute on chronic combined systolic and diastolic CHF (congestive heart failure)   SOB (shortness of breath)   Hypertension   Moderate aortic regurgitation   Diabetes   CKD (chronic kidney disease), stage IV   Abdominal pain   Fever   Obese   Sleep apnea   Hypothyroid   Panhypopituitarism   PLAN:   ?  Rt and Lt cath in am- (he doesn't look like he can lay still for this).  Kerin Ransom PA-C Beeper [phone] 12/10/2014, 9:55 AM

## 2014-12-11 ENCOUNTER — Encounter (HOSPITAL_COMMUNITY): Payer: Self-pay | Admitting: Student

## 2014-12-11 DIAGNOSIS — E0822 Diabetes mellitus due to underlying condition with diabetic chronic kidney disease: Secondary | ICD-10-CM

## 2014-12-11 LAB — RENAL FUNCTION PANEL
ANION GAP: 10 (ref 5–15)
Albumin: 3.3 g/dL — ABNORMAL LOW (ref 3.5–5.2)
BUN: 68 mg/dL — AB (ref 6–23)
CALCIUM: 8.7 mg/dL (ref 8.4–10.5)
CO2: 19 mmol/L (ref 19–32)
Chloride: 106 mEq/L (ref 96–112)
Creatinine, Ser: 2.69 mg/dL — ABNORMAL HIGH (ref 0.50–1.35)
GFR calc Af Amer: 26 mL/min — ABNORMAL LOW (ref >90)
GFR calc non Af Amer: 22 mL/min — ABNORMAL LOW (ref >90)
Glucose, Bld: 97 mg/dL (ref 70–99)
PHOSPHORUS: 4.2 mg/dL (ref 2.3–4.6)
Potassium: 5.1 mmol/L (ref 3.5–5.1)
Sodium: 135 mmol/L (ref 135–145)

## 2014-12-11 LAB — CBC
HEMATOCRIT: 38 % — AB (ref 39.0–52.0)
Hemoglobin: 11.6 g/dL — ABNORMAL LOW (ref 13.0–17.0)
MCH: 26.6 pg (ref 26.0–34.0)
MCHC: 30.5 g/dL (ref 30.0–36.0)
MCV: 87.2 fL (ref 78.0–100.0)
Platelets: 197 10*3/uL (ref 150–400)
RBC: 4.36 MIL/uL (ref 4.22–5.81)
RDW: 17.2 % — ABNORMAL HIGH (ref 11.5–15.5)
WBC: 7 10*3/uL (ref 4.0–10.5)

## 2014-12-11 LAB — GLUCOSE, CAPILLARY
GLUCOSE-CAPILLARY: 144 mg/dL — AB (ref 70–99)
Glucose-Capillary: 97 mg/dL (ref 70–99)
Glucose-Capillary: 102 mg/dL — ABNORMAL HIGH (ref 70–99)
Glucose-Capillary: 91 mg/dL (ref 70–99)

## 2014-12-11 MED ORDER — SODIUM CHLORIDE 0.9 % IJ SOLN
3.0000 mL | Freq: Two times a day (BID) | INTRAMUSCULAR | Status: DC
  Administered 2014-12-13: 3 mL via INTRAVENOUS

## 2014-12-11 MED ORDER — CIPROFLOXACIN HCL 500 MG PO TABS
250.0000 mg | ORAL_TABLET | Freq: Two times a day (BID) | ORAL | Status: DC
  Administered 2014-12-11 – 2014-12-13 (×5): 250 mg via ORAL
  Filled 2014-12-11 (×5): qty 1

## 2014-12-11 MED ORDER — SODIUM CHLORIDE 0.9 % IJ SOLN: 3.0000 mL | INTRAMUSCULAR | Status: DC | PRN

## 2014-12-11 MED ORDER — METRONIDAZOLE 500 MG PO TABS
500.0000 mg | ORAL_TABLET | Freq: Three times a day (TID) | ORAL | Status: DC
  Administered 2014-12-11 – 2014-12-13 (×5): 500 mg via ORAL
  Filled 2014-12-11 (×4): qty 1

## 2014-12-11 MED ORDER — SODIUM CHLORIDE 0.9 % IV SOLN
250.0000 mg | INTRAVENOUS | Status: DC
  Administered 2014-12-11 – 2014-12-13 (×2): 250 mg via INTRAVENOUS
  Filled 2014-12-11 (×5): qty 20

## 2014-12-11 MED ORDER — SODIUM CHLORIDE 0.9 % IV SOLN: 250.0000 mL | INTRAVENOUS | Status: DC | PRN

## 2014-12-11 NOTE — Clinical Social Work Psychosocial (Signed)
     Clinical Social Work Department BRIEF PSYCHOSOCIAL ASSESSMENT 12/11/2014  Patient:  [name] [name], [name] [name]     Account Number:  [id number]     Admit date:  12/04/2014  Clinical Social Worker:  Adair Laundry  Date/Time:  12/11/2014 01:00 PM  Referred by:  Physician  Date Referred:  12/11/2014 Referred for  SNF Placement   Other Referral:   Interview type:  Patient Other interview type:   Spoke with pt and later with pt daughter over the phone    PSYCHOSOCIAL DATA Living Status:  ALONE Admitted from facility:   Level of care:   Primary support name:  [name] [name] Primary support relationship to patient:  CHILD, ADULT Degree of support available:   Pt has good support    CURRENT CONCERNS Current Concerns  Post-Acute Placement   Other Concerns:    SOCIAL WORK ASSESSMENT / PLAN CSW aware of PT recommendation for SNF. CSW visited pt room and pt confirmed he was aware of recommendation. Pt did have questions about ST rehab and questioned why he would need this service. After explanation, pt in agreement that he is weaker than he was prior to admission and could benefit from SNF. Pt expressed a preference for [facility] or [facility]. CSW explained SNF referral process. Pt agreeable to referral being sent to all [facility].  CSW also spoke with pt daughter [name] [name] over the phone and confirmed dc plan. She is also in agreement and expressed same facility preferences as pt.   Assessment/plan status:  Psychosocial Support/Ongoing Assessment of Needs Other assessment/ plan:   Information/referral to community resources:   SNF list to be provided with bed offers    PATIENTS/FAMILYS RESPONSE TO PLAN OF CARE: Pt seated in recliner and in good spirits during assessment. Pt and family agreeable to dc plan of SNF.      [place], [place]

## 2014-12-11 NOTE — Progress Notes (Signed)
Patient ID: JEANPIERRE THEBEAU, male   DOB: 13-Mar-1946, 69 y.o.   MRN: [id number]  [facility] Progress Note    Assessment/ Plan:   1. Acute renal failure on chronic kidney disease stage IV in a patient with a solitary kidney: Renal function fairly stable with acceptable electrolytes and improving volume status following intermittent furosemide dosing. Anticipated contrast exposure for coronary angiography/possible valve repair. Assuming a 50-75 mL volume contrast exposure, his risk of acute renal insufficiency is about 20% with the dialysis risk of 5%. The strategy is to limit contrast exposure and hold diuretics for at least 24 hours before and after contrast exposure. Not on ACE inhibitor/ARB at this time and not hypotensive. Obviously, do not recommend intravenous fluids in his current status. Okay to proceed with coronary angiography and will need monitoring for at least 48 hours thereafter with current precautions. 2. Acute exacerbation of congestive heart failure-history of severe aortic insufficiency with an ejection fraction of 40-45%-intermittent furosemide with symptomatic improvement. 3. Panhypopituitarism: Ongoing replacement therapy 4. Morbid obesity-possibly with an element of OSA 5. Anemia of chronic disease: Hemoglobin acceptable but he has evidence of iron deficiency-low iron saturation/ferritin. Will give intravenous iron  Subjective:   No acute events overnight. Patient somnolent and does not voice any complaints    Objective:   BP 124/43 mmHg  Pulse 68  Temp(Src) 97.4 F (36.3 C) (Oral)  Resp 21  Ht 6\' 1"  (1.854 m)  Wt 124.694 kg (274 lb 14.4 oz)  BMI 36.28 kg/m2  SpO2 99%  Intake/Output Summary (Last 24 hours) at 12/11/14 0949 Last data filed at 12/11/14 0537  Gross per 24 hour  Intake    300 ml  Output   1100 ml  Net   -800 ml   Weight change: 0.363 kg (12.8 oz)  Physical Exam: Gen: Patient sleeping in his recliner-awakens to voice but immediately  falls back asleep CVS: Pulse regular in rate and rhythm, S1 and S2 normal Resp: Fine rales bibasally-no wheeze Abd: Soft, obese, nontender Ext: 2-3+ lower extremity edema  Imaging: No results found.  Labs: BMET  Recent Labs Lab 12/05/14 1351 12/06/14 0319 12/07/14 0420 12/08/14 0455 12/09/14 0400 12/10/14 0315 12/11/14 0347  NA 140 138 134* 136 137 137 135  K 4.2 4.7 4.2 4.4 4.3 4.5 5.1  CL 105 103 103 104 105 105 106  CO2 26 26 23  13* 20 21 19   GLUCOSE 107* 90 95 103* 121* 96 97  BUN 55* 54* 56* 60* 65* 66* 68*  CREATININE 2.66* 2.69* 3.04* 2.97* 2.85* 2.74* 2.69*  CALCIUM 8.8 8.7 8.2* 8.5 8.3* 8.7 8.7  PHOS  --   --   --  3.8 3.9 4.1 4.2   CBC  Recent Labs Lab 12/04/14 0959 12/04/14 1809 12/06/14 0319 12/08/14 0455 12/11/14 0347  WBC 7.2 6.3 6.1 7.1 7.0  NEUTROABS 4.4  --   --   --   --   HGB 12.0* 11.7* 11.4* 11.3* 11.6*  HCT 39.4 37.9* 38.2* 36.5* 38.0*  MCV 89.5 87.5 88.4 86.7 87.2  PLT 158 177 183 179 197    Medications:    . antiseptic oral rinse  7 mL Mouth Rinse BID  . aspirin EC  325 mg Oral Daily  . atorvastatin  40 mg Oral q1800  . budesonide-formoterol  2 puff Inhalation BID  . ciprofloxacin  500 mg Oral BID  . cloNIDine  0.2 mg Oral TID  . desmopressin  0.2 mg Oral BID  .  dexamethasone  0.5 mg Oral Daily  . heparin  5,000 Units Subcutaneous 3 times per day  . insulin aspart  0-9 Units Subcutaneous TID WC  . levothyroxine  175 mcg Oral QAC breakfast  . metroNIDAZOLE  500 mg Oral 3 times per day  . phenytoin  200 mg Oral Once per day on Mon Thu  . phenytoin  300 mg Oral Once per day on Sun Tue Wed Fri Sat  . sodium chloride  3 mL Intravenous Q12H  . tiotropium  18 mcg Inhalation Daily      Elmarie Shiley, MD 12/11/2014, 9:49 AM

## 2014-12-11 NOTE — Progress Notes (Signed)
Patient Name: [name] [name] Date of Encounter: 12/11/2014    Principal Problem:   Sepsis Active Problems:   Moderate aortic regurgitation   CKD (chronic kidney disease), stage IV   Acute on chronic combined systolic and diastolic CHF (congestive heart failure)   Obese   Hypertension   Diabetes   Acalculous cholecystitis   SOB (shortness of breath)   Fever   Sleep apnea   Hypothyroid   Panhypopituitarism   Abdominal pain    SUBJECTIVE  Overall breathing better.  Still not sure if he can lie flat but thinks he may be able to.  Renal fxn stable.  Says ABX make him a little SOB    CURRENT MEDS . antiseptic oral rinse  7 mL Mouth Rinse BID  . aspirin EC  325 mg Oral Daily  . atorvastatin  40 mg Oral q1800  . budesonide-formoterol  2 puff Inhalation BID  . ciprofloxacin  500 mg Oral BID  . cloNIDine  0.2 mg Oral TID  . desmopressin  0.2 mg Oral BID  . dexamethasone  0.5 mg Oral Daily  . heparin  5,000 Units Subcutaneous 3 times per day  . insulin aspart  0-9 Units Subcutaneous TID WC  . levothyroxine  175 mcg Oral QAC breakfast  . metroNIDAZOLE  500 mg Oral 3 times per day  . phenytoin  200 mg Oral Once per day on Mon Thu  . phenytoin  300 mg Oral Once per day on Sun Tue Wed Fri Sat  . sodium chloride  3 mL Intravenous Q12H  . tiotropium  18 mcg Inhalation Daily    OBJECTIVE  Filed Vitals:   12/10/14 2030 12/10/14 2146 12/11/14 0100 12/11/14 0507  BP: 130/49 143/42 128/44 130/51  Pulse: 81  80 76  Temp: 98.2 F (36.8 [name])   97.3 F (36.3 [name])  TempSrc:    Oral  Resp: 15  22 23   Height:      Weight:    274 lb 14.4 oz (124.694 kg)  SpO2: 97%  96% 99%    Intake/Output Summary (Last 24 hours) at 12/11/14 0724 Last data filed at 12/11/14 0537  Gross per 24 hour  Intake    540 ml  Output   1100 ml  Net   -560 ml   Filed Weights   12/09/14 0620 12/10/14 0641 12/11/14 0507  Weight: 274 lb 12.8 oz (124.648 kg) 274 lb 1.6 oz (124.331 kg) 274 lb 14.4 oz (124.694 kg)      PHYSICAL EXAM  General: Pleasant, NAD. Neuro: Alert and oriented X 3. Moves all extremities spontaneously. Psych: Normal affect. HEENT:  Normal  Neck: Supple without bruits.  JVP difficult to ascertain 2/2 girth. Lungs:  Resp regular and unlabored, bibasilar crackles. Heart: RRR decreased S2 no s3, s4, 2/6 SEM/DM @ RUSB. Abdomen: Firm, non-tender, distended, BS + x 4.  Extremities: No clubbing, cyanosis.  1 to 2+ bilat LE edema. DP/PT/Radials 1+ and equal bilaterally.  Accessory Clinical Findings  CBC  Recent Labs  12/11/14 0347  WBC 7.0  HGB 11.6*  HCT 38.0*  MCV 87.2  PLT 734   Basic Metabolic Panel  Recent Labs  12/09/14 0400 12/10/14 0315 12/11/14 0347  NA 137 137 135  K 4.3 4.5 5.1  CL 105 105 106  CO2 20 21 19   GLUCOSE 121* 96 97  BUN 65* 66* 68*  CREATININE 2.85* 2.74* 2.69*  CALCIUM 8.3* 8.7 8.7  MG 2.5  --   --  PHOS 3.9 4.1 4.2   Liver Function Tests  Recent Labs  12/10/14 0315 12/11/14 0347  ALBUMIN 3.4* 3.3*   TELE  RSR  Radiology/Studies  Ct Head Wo Contrast  12/08/2014   CLINICAL DATA:  Initial evaluation for right-sided facial numbness.  EXAM: CT HEAD WITHOUT CONTRAST  TECHNIQUE: Contiguous axial images were obtained from the base of the skull through the vertex without intravenous contrast.  COMPARISON:  Prior MRI from 05/27/2010.  FINDINGS: Encephalomalacia within the anterior inferior left frontal lobe most likely postsurgical in nature. Dural thickening with deformity of the calvarium also likely related to prior surgery. Prominent vascular calcifications present within the carotid siphons and distal vertebral arteries.  Diffuse prominence of the CSF containing spaces compatible with generalized atrophy. No acute large vessel territory infarct. No mass lesion or midline shift. No hydrocephalus. No extra-axial fluid collection.  Scalp soft tissues within normal limits. No acute abnormality seen about the orbits.  Paranasal sinuses are  clear.  No mastoid effusion.  IMPRESSION: 1. No acute intracranial process. 2. 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   Electronically Signed   By: Jeannine Boga M.D.   On: 12/08/2014 23:58   Dg Chest Port 1 View  12/06/2014   CLINICAL DATA:  New onset fever today. History of CHF, diabetes, hypertension, COPD, lower leg cellulitis, sleep apnea, kidney disease, and seizures.  EXAM: PORTABLE CHEST - 1 VIEW  COMPARISON:  12/04/2014  FINDINGS: Enlargement of the cardiac silhouette is unchanged. Prominence of the superior mediastinum is likely secondary to technique. Vascular congestion and interstitial opacities have slightly increased compared to the prior study. There may be small bilateral pleural effusions. No segmental airspace consolidation or pneumothorax is seen.  IMPRESSION: Slightly increased pulmonary edema.   Electronically Signed   By: Logan Bores   On: 12/06/2014    ASSESSMENT AND PLAN  1. Acute on chronic combined systolic and diastolic heart failure: Was on Lasix 80 mg IV bid - d/[name]'d yesterday morning. Weight 276 lbs  274 lbs, w/ net neg of 5.1 L for this admission so far.  Per renal - hold lasix for 24 hrs pre-cath with plan for R & L heart cath early this week - ? Tomorrow.  2. Sepsis: Currently on vancomycin and Zosyn. Blood cultures negative. WBC normal. Management per hospitalist service.   3. Acute on chronic stage 4 kidney disease: Creat stable this AM.  Renal following. Lasix d/[name]'d yesterday.  May ultimately require hemodialysis.   4. Essential HTN: Controlled on present therapy which includes clonidine. On amlodipine as outpatient.   5. Abdominal pain, nausea, vomiting, fever: HIDA negative. On Abx.   6. Chest pain: Resolved. With decline in LV fxn and mod-severe AI, R & L heart cath this week - ? Tomorrow.  7. NSVT: Noted on telemetry 1.2.16. Asymptomatic at  time. K and Mg normal.   8.  Mod to Severe Aortic insufficiency:  As above, awaiting R & L heart cath.  Will likely require AVR.  Signed, [name] Hodgkins NP [name] seen and examined.  Agree with findings of [name] [name].  I have amended note to reflect my findings.  Patient complicated.  Completing ABX for sepsis.  Alos with systolic CHF and  mod to severe aortic insufficiency  WIll need R and L heart catheterization.  Renal is following as well.  Cr stable this am  Await further recommendations from them  Poss cath tomorrow.  Marland Kitchen  [name].

## 2014-12-11 NOTE — Clinical Social Work Placement (Addendum)
     Clinical Social Work Department CLINICAL SOCIAL WORK PLACEMENT NOTE 12/19/2014  Patient:  [name] [name], [name] [name]  Account Number:  [id number] Admit date:  12/04/2014  Clinical Social Worker:  Berton Mount, Latanya Presser  Date/time:  12/11/2014 01:04 PM  Clinical Social Work is seeking post-discharge placement for this patient at the following level of care:   [facility]   (*CSW will update this form in Epic as items are completed)   12/11/2014  Patient/family provided with [place] Department of Clinical Social Works list of facilities offering this level of care within the geographic area requested by the patient (or if unable, by the patients family).  12/11/2014  Patient/family informed of their freedom to choose among providers that offer the needed level of care, that participate in Medicare, Medicaid or managed care program needed by the patient, have an available bed and are willing to accept the patient.  12/11/2014  Patient/family informed of [facility] ownership interest in [facility], as well as of the fact that they are under no obligation to receive care at this facility.  PASARR submitted to EDS on 12/11/2014 PASARR number received on 12/11/2014  FL2 transmitted to all facilities in geographic area requested by pt/family on  12/11/2014 FL2 transmitted to all facilities within larger geographic area on   Patient informed that his/her managed care company has contracts with or will negotiate with  certain facilities, including the following:   [facility]     Patient/family informed of bed offers received:  12/18/2014 Patient chooses bed at [facility] Physician recommends and patient chooses bed at    Patient to be transferred to [facility] on  12/19/2014 Patient to be transferred to facility by Ambulance  [name] [name]) Patient and family notified of transfer on 12/19/2014 Name of family member notified:  [name] [name]- Daughter  The  following physician request were entered in Epic: Physician Request  Please sign FL2.    Additional CommentsAdair Laundry [phone]  12/19/14 Ok per MD for d/c to SNF today. Patient and daughter notified and are agreeable to d/c today.  Nursing to call report.  CSW signing off.  Lorie Phenix. [place], [place], [phone] (coverage)

## 2014-12-11 NOTE — Progress Notes (Signed)
CSW (Clinical Social Worker) received call from [facility] that they can offer a bed for pt on Wednesday if pt is medically stable. CSW confirmed they were pt first preference. Facility to begin insurance authorization process.  [place], [place]

## 2014-12-11 NOTE — Progress Notes (Signed)
UR Completed Evertt Chouinard Graves-Bigelow, RN,BSN [phone]  

## 2014-12-11 NOTE — Progress Notes (Signed)
Pt. Refused cpap. 

## 2014-12-11 NOTE — Progress Notes (Signed)
Chart reviewed.                                                                                                         Patient Demographics  [name] [name], is a 69 y.o. male, DOB - 1946/04/02, NWG:[id number]  Admit date - 12/04/2014   Admitting Physician Troy Sine, MD  Outpatient Primary MD for the patient is [name], MD  LOS - 7   Chief Complaint  Patient presents with  . Shortness of Breath      Admission history of present illness/brief narrative: 69 year-old gentleman presenting to the Emergency Dept today with multiple complaints. These include chest pain, shortness of breath, and RUQ pain. He has been previously scheduled for elective cardiac catheterization 12/29 to evaluate severe aortic insufficiency and declining LV function. However, the patient came to the emergency department today because he has noticed an acute change in his symptoms over the last 24 hours. He complains of aching all over. He also has had some nausea and vomiting. He complains of pain in his upper abdominal area. He has also had chest pain. His breathing has been worse over the last 2-3 days. He does feel that his "retaining more fluid." He admits to dietary indiscretion with salt. He has been taking his medications. Right upper quadrant ultrasound was done and there concern about acalculous cholecystitis general surgery recommended HIDA scan which was negative. Pain improved on PO abx.  Patient awaiting cath over the weekend.   Assessment & Plan    Principal Problem:   Sepsis Active Problems:   Obese   Hypertension   Sleep apnea   Hypothyroid   Panhypopituitarism   Moderate aortic regurgitation   Diabetes   CKD (chronic kidney disease), stage IV   Acalculous cholecystitis   Abdominal pain   Acute on chronic combined systolic and diastolic CHF (congestive heart failure)   SOB (shortness of breath)   Fever   Chest pain: For cath tomorrow. Lasix held  Abdominal pain, nausea,  vomiting, HIDA negative.   Fever -BC x2 NGTD -vanc/zosyn- transition over to PO cipro/flagyl for a 10 days course -CT scan abd show inflammation- tender in RUQ is gone after ABX -outpatient surgery follow up  Acute on chronic systolic/diastolic heart failure,  -LVEF 40-45 percent with diffuse hypokinesis, diastolic dysfunction, moderate to severe aortic regurgitation, severe pulmonary hypertension by echocardiogram 12/10. Lasix held for cath  Panhypopituitarism -Status post pituitary excision, continue with desmopressin, Decadron, Synthroid.  Seizure disorder -Status post craniectomy, continue with phenytoin. Level ok on admission  Diabetes mellitus controlled  COPD -No active wheezing, on 2 L at baseline, appears stable  chronic renal failure-stage IV May require dialysis soon.  Code Status: Full  Family Communication:   Disposition Plan: Remains on telemetry   Procedures  None   Consults   Cardiology nephrology Surgery   Medications  Scheduled Meds: . antiseptic oral rinse  7 mL Mouth Rinse BID  . aspirin EC  325 mg Oral Daily  . atorvastatin  40 mg Oral q1800  . budesonide-formoterol  2  puff Inhalation BID  . cloNIDine  0.2 mg Oral TID  . desmopressin  0.2 mg Oral BID  . dexamethasone  0.5 mg Oral Daily  . ferric gluconate (FERRLECIT/NULECIT) IV  250 mg Intravenous Q M,W,F  . heparin  5,000 Units Subcutaneous 3 times per day  . insulin aspart  0-9 Units Subcutaneous TID WC  . levothyroxine  175 mcg Oral QAC breakfast  . phenytoin  200 mg Oral Once per day on Mon Thu  . phenytoin  300 mg Oral Once per day on Sun Tue Wed Fri Sat  . sodium chloride  3 mL Intravenous Q12H  . tiotropium  18 mcg Inhalation Daily   Continuous Infusions:  PRN Meds:.sodium chloride, acetaminophen, acetaminophen, albuterol, magic mouthwash, ondansetron (ZOFRAN) IV, ondansetron, sodium chloride  DVT Prophylaxis   Heparin -  Lab Results  Component Value Date   PLT 197  12/11/2014    Antibiotics    Anti-infectives    Start     Dose/Rate Route Frequency Ordered Stop   12/09/14 1400  metroNIDAZOLE (FLAGYL) tablet 500 mg  Status:  Discontinued     500 mg Oral 3 times per day 12/09/14 1111 12/11/14 1509   12/09/14 1200  amoxicillin-clavulanate (AUGMENTIN) 875-125 MG per tablet 1 tablet  Status:  Discontinued     1 tablet Oral Every 12 hours 12/09/14 1111 12/09/14 1124   12/09/14 1200  ciprofloxacin (CIPRO) tablet 500 mg  Status:  Discontinued     500 mg Oral 2 times daily 12/09/14 1124 12/11/14 1509   12/08/14 0900  vancomycin (VANCOCIN) 1,750 mg in sodium chloride 0.9 % 500 mL IVPB  Status:  Discontinued     1,750 mg250 mL/hr over 120 Minutes Intravenous Every 48 hours 12/06/14 0753 12/09/14 1111   12/06/14 1500  piperacillin-tazobactam (ZOSYN) IVPB 3.375 g  Status:  Discontinued     3.375 g12.5 mL/hr over 240 Minutes Intravenous Every 8 hours 12/06/14 0748 12/09/14 1111   12/06/14 0800  piperacillin-tazobactam (ZOSYN) IVPB 3.375 g     3.375 g100 mL/hr over 30 Minutes Intravenous  Once 12/06/14 0747 12/06/14 0921   12/06/14 0800  vancomycin (VANCOCIN) 2,000 mg in sodium chloride 0.9 % 500 mL IVPB     2,000 mg250 mL/hr over 120 Minutes Intravenous  Once 12/06/14 0747 12/06/14 1211   12/06/14 0700  cefTRIAXone (ROCEPHIN) 1 g in dextrose 5 % 50 mL IVPB - Premix  Status:  Discontinued     1 g100 mL/hr over 30 Minutes Intravenous  Once 12/06/14 0658 12/06/14 0748   12/04/14 1830  cefTRIAXone (ROCEPHIN) 1 g in dextrose 5 % 50 mL IVPB - Premix  Status:  Discontinued     1 g100 mL/hr over 30 Minutes Intravenous Every 24 hours 12/04/14 1727 12/05/14 1717      Subjective: no dyspnea.    Objective:   Filed Vitals:   12/11/14 0507 12/11/14 0804 12/11/14 0902 12/11/14 1016  BP: 130/51 124/43  127/43  Pulse: 76 68    Temp: 97.3 F (36.3 C) 97.4 F (36.3 C)    TempSrc: Oral Oral    Resp: 23 21    Height:      Weight: 124.694 kg (274 lb 14.4 oz)     SpO2:  99% 96% 99%     Wt Readings from Last 3 Encounters:  12/11/14 124.694 kg (274 lb 14.4 oz)  11/16/14 127.007 kg (280 lb)  06/12/14 121.201 kg (267 lb 3.2 oz)     Intake/Output Summary (Last 24 hours)  at 12/11/14 1510 Last data filed at 12/11/14 1300  Gross per 24 hour  Intake    420 ml  Output    500 ml  Net    -80 ml     Physical Exam  Awake in chair a and o Symmetrical Chest wall movement, Good air movement bilaterally, CTAB RRR,No Gallops,Rubs or new Murmurs, No Parasternal Heave +ve B.Sounds, obese +2edema   Data Review   Micro Results Recent Results (from the past 240 hour(s))  MRSA PCR Screening     Status: None   Collection Time: 12/04/14  5:44 PM  Result Value Ref Range Status   MRSA by PCR NEGATIVE NEGATIVE Final    Comment:        The GeneXpert MRSA Assay (FDA approved for NASAL specimens only), is one component of a comprehensive MRSA colonization surveillance program. It is not intended to diagnose MRSA infection nor to guide or monitor treatment for MRSA infections.   Culture, blood (routine x 2)     Status: None (Preliminary result)   Collection Time: 12/06/14  8:45 AM  Result Value Ref Range Status   Specimen Description BLOOD LEFT ARM  Final   Special Requests BOTTLES DRAWN AEROBIC AND ANAEROBIC 10CC  Final   Culture   Final           BLOOD CULTURE RECEIVED NO GROWTH TO DATE CULTURE WILL BE HELD FOR 5 DAYS BEFORE ISSUING A FINAL NEGATIVE REPORT Note: Culture results may be compromised due to an inadequate volume of blood received in culture bottles. Performed at [facility]    Report Status PENDING  Incomplete  Culture, blood (routine x 2)     Status: None (Preliminary result)   Collection Time: 12/06/14  8:50 AM  Result Value Ref Range Status   Specimen Description BLOOD RIGHT HAND  Final   Special Requests BOTTLES DRAWN AEROBIC ONLY 10CC  Final   Culture   Final           BLOOD CULTURE RECEIVED NO GROWTH TO DATE CULTURE WILL BE  HELD FOR 5 DAYS BEFORE ISSUING A FINAL NEGATIVE REPORT Note: Culture results may be compromised due to an excessive volume of blood received in culture bottles. Performed at [facility]    Report Status PENDING  Incomplete  Urine culture     Status: None   Collection Time: 12/06/14 10:30 PM  Result Value Ref Range Status   Specimen Description URINE, CLEAN CATCH  Final   Special Requests Normal  Final   Colony Count NO GROWTH Performed at [facility]   Final   Culture NO GROWTH Performed at [facility]   Final   Report Status 12/10/2014 FINAL  Final  Clostridium Difficile by PCR     Status: None   Collection Time: 12/06/14 10:42 PM  Result Value Ref Range Status   C difficile by pcr NEGATIVE NEGATIVE Final  Clostridium Difficile by PCR     Status: None   Collection Time: 12/10/14  1:19 AM  Result Value Ref Range Status   C difficile by pcr NEGATIVE NEGATIVE Final    Radiology Reports No results found.  CBC  Recent Labs Lab 12/04/14 1809 12/06/14 0319 12/08/14 0455 12/11/14 0347  WBC 6.3 6.1 7.1 7.0  HGB 11.7* 11.4* 11.3* 11.6*  HCT 37.9* 38.2* 36.5* 38.0*  PLT 177 183 179 197  MCV 87.5 88.4 86.7 87.2  MCH 27.0 26.4 26.8 26.6  MCHC 30.9 29.8* 31.0 30.5  RDW 17.2* 17.3* 17.1* 17.2*    Chemistries   Recent Labs Lab 12/06/14 0319 12/07/14 0420 12/08/14 0455 12/09/14 0400 12/10/14 0315 12/11/14 0347  NA 138 134* 136 137 137 135  K 4.7 4.2 4.4 4.3 4.5 5.1  CL 103 103 104 105 105 106  CO2 26 23 13* 20 21 19   GLUCOSE 90 95 103* 121* 96 97  BUN 54* 56* 60* 65* 66* 68*  CREATININE 2.69* 3.04* 2.97* 2.85* 2.74* 2.69*  CALCIUM 8.7 8.2* 8.5 8.3* 8.7 8.7  MG  --   --   --  2.5  --   --   AST 17  --   --   --   --   --   ALT 12  --   --   --   --   --   ALKPHOS 55  --   --   --   --   --   BILITOT 0.8  --   --   --   --   --     ------------------------------------------------------------------------------------------------------------------ estimated creatinine clearance is 35.3 mL/min (by C-G formula based on Cr of 2.69). ------------------------------------------------------------------------------------------------------------------ No results for input(s): HGBA1C in the last 72 hours. ------------------------------------------------------------------------------------------------------------------ No results for input(s): CHOL, HDL, LDLCALC, TRIG, CHOLHDL, LDLDIRECT in the last 72 hours. ------------------------------------------------------------------------------------------------------------------ No results for input(s): TSH, T4TOTAL, T3FREE, THYROIDAB in the last 72 hours.  Invalid input(s): FREET3 ------------------------------------------------------------------------------------------------------------------ No results for input(s): VITAMINB12, FOLATE, FERRITIN, TIBC, IRON, RETICCTPCT in the last 72 hours.  Coagulation profile No results for input(s): INR, PROTIME in the last 168 hours.  No results for input(s): DDIMER in the last 72 hours.  Cardiac Enzymes  Recent Labs Lab 12/04/14 1809 12/04/14 2312 12/05/14 0550  TROPONINI 0.04* 0.03 0.03   ------------------------------------------------------------------------------------------------------------------ Invalid input(s): POCBNP     Time Spent in minutes   25 minutes   Juliannah Ohmann L MD on 12/11/2014 at 3:10 PM  [email or web address] - password [facility]   [facility]

## 2014-12-12 ENCOUNTER — Encounter (HOSPITAL_COMMUNITY)
Admission: EM | Disposition: A | Discharge status: Skilled Nursing Facility | Payer: Self-pay | Source: Home / Self Care | Attending: Internal Medicine

## 2014-12-12 ENCOUNTER — Encounter (HOSPITAL_COMMUNITY): Payer: Self-pay | Admitting: Cardiology

## 2014-12-12 DIAGNOSIS — I351 Nonrheumatic aortic (valve) insufficiency: Secondary | ICD-10-CM

## 2014-12-12 HISTORY — PX: LEFT AND RIGHT HEART CATHETERIZATION WITH CORONARY ANGIOGRAM: SHX5449

## 2014-12-12 LAB — RENAL FUNCTION PANEL
Albumin: 3.3 g/dL — ABNORMAL LOW (ref 3.5–5.2)
Anion gap: 14 (ref 5–15)
BUN: 74 mg/dL — ABNORMAL HIGH (ref 6–23)
CO2: 17 mmol/L — ABNORMAL LOW (ref 19–32)
CREATININE: 2.68 mg/dL — AB (ref 0.50–1.35)
Calcium: 8.7 mg/dL (ref 8.4–10.5)
Chloride: 105 mEq/L (ref 96–112)
GFR calc Af Amer: 26 mL/min — ABNORMAL LOW (ref >90)
GFR calc non Af Amer: 23 mL/min — ABNORMAL LOW (ref >90)
GLUCOSE: 82 mg/dL (ref 70–99)
Phosphorus: 4.9 mg/dL — ABNORMAL HIGH (ref 2.3–4.6)
Potassium: 4.9 mmol/L (ref 3.5–5.1)
Sodium: 136 mmol/L (ref 135–145)

## 2014-12-12 LAB — GLUCOSE, CAPILLARY
Glucose-Capillary: 84 mg/dL (ref 70–99)
Glucose-Capillary: 98 mg/dL (ref 70–99)
Glucose-Capillary: 89 mg/dL (ref 70–99)
Glucose-Capillary: 177 mg/dL — ABNORMAL HIGH (ref 70–99)

## 2014-12-12 LAB — POCT I-STAT 3, VENOUS BLOOD GAS (G3P V)
Acid-base deficit: 6 mmol/L — ABNORMAL HIGH (ref 0.0–2.0)
BICARBONATE: 19.4 meq/L — AB (ref 20.0–24.0)
O2 Saturation: 48 %
PCO2 VEN: 38.3 mmHg — AB (ref 45.0–50.0)
TCO2: 21 mmol/L (ref 0–100)
pH, Ven: 7.312 — ABNORMAL HIGH (ref 7.250–7.300)
pO2, Ven: 28 mmHg — CL (ref 30.0–45.0)

## 2014-12-12 LAB — CULTURE, BLOOD (ROUTINE X 2)
Culture: NO GROWTH
Culture: NO GROWTH

## 2014-12-12 LAB — POCT I-STAT 3, ART BLOOD GAS (G3+)
Acid-base deficit: 6 mmol/L — ABNORMAL HIGH (ref 0.0–2.0)
Bicarbonate: 17.9 mEq/L — ABNORMAL LOW (ref 20.0–24.0)
O2 Saturation: 98 %
PCO2 ART: 31.1 mmHg — AB (ref 35.0–45.0)
PH ART: 7.368 (ref 7.350–7.450)
PO2 ART: 114 mmHg — AB (ref 80.0–100.0)
TCO2: 19 mmol/L (ref 0–100)

## 2014-12-12 LAB — PROTIME-INR
INR: 1.5 — ABNORMAL HIGH (ref 0.00–1.49)
Prothrombin Time: 18.3 seconds — ABNORMAL HIGH (ref 11.6–15.2)

## 2014-12-12 SURGERY — LEFT AND RIGHT HEART CATHETERIZATION WITH CORONARY ANGIOGRAM

## 2014-12-12 MED ORDER — SODIUM CHLORIDE 0.9 % IV SOLN: 250.0000 mL | INTRAVENOUS | Status: DC | PRN

## 2014-12-12 MED ORDER — ACETAMINOPHEN 325 MG PO TABS: 650.0000 mg | ORAL_TABLET | ORAL | Status: DC | PRN

## 2014-12-12 MED ORDER — HEPARIN (PORCINE) IN NACL 2-0.9 UNIT/ML-% IJ SOLN
INTRAMUSCULAR | Status: AC
  Filled 2014-12-12: qty 500

## 2014-12-12 MED ORDER — SODIUM CHLORIDE 0.9 % IJ SOLN
3.0000 mL | Freq: Two times a day (BID) | INTRAMUSCULAR | Status: DC
  Administered 2014-12-13 – 2014-12-17 (×4): 3 mL via INTRAVENOUS

## 2014-12-12 MED ORDER — VERAPAMIL HCL 2.5 MG/ML IV SOLN
INTRAVENOUS | Status: AC
  Filled 2014-12-12: qty 2

## 2014-12-12 MED ORDER — NITROGLYCERIN 1 MG/10 ML FOR IR/CATH LAB
INTRA_ARTERIAL | Status: AC
  Filled 2014-12-12: qty 10

## 2014-12-12 MED ORDER — MIDAZOLAM HCL 2 MG/2ML IJ SOLN
INTRAMUSCULAR | Status: AC
  Filled 2014-12-12: qty 2

## 2014-12-12 MED ORDER — HEPARIN SODIUM (PORCINE) 5000 UNIT/ML IJ SOLN: 5000.0000 [IU] | Freq: Three times a day (TID) | INTRAMUSCULAR | Status: DC

## 2014-12-12 MED ORDER — SODIUM CHLORIDE 0.9 % IV SOLN: 250.0000 mL | INTRAVENOUS | Status: DC

## 2014-12-12 MED ORDER — SODIUM CHLORIDE 0.9 % IJ SOLN: 3.0000 mL | INTRAMUSCULAR | Status: DC | PRN

## 2014-12-12 MED ORDER — LIDOCAINE HCL (PF) 1 % IJ SOLN
INTRAMUSCULAR | Status: AC
  Filled 2014-12-12: qty 30

## 2014-12-12 MED ORDER — FENTANYL CITRATE 0.05 MG/ML IJ SOLN
INTRAMUSCULAR | Status: AC
  Filled 2014-12-12: qty 2

## 2014-12-12 MED ORDER — ONDANSETRON HCL 4 MG/2ML IJ SOLN: 4.0000 mg | Freq: Four times a day (QID) | INTRAMUSCULAR | Status: DC | PRN

## 2014-12-12 MED ORDER — SODIUM BICARBONATE 650 MG PO TABS
650.0000 mg | ORAL_TABLET | Freq: Two times a day (BID) | ORAL | Status: DC
  Administered 2014-12-12 – 2014-12-19 (×15): 650 mg via ORAL
  Filled 2014-12-12 (×15): qty 1

## 2014-12-12 NOTE — H&P (View-Only) (Signed)
Patient Name: [name] [name] Date of Encounter: 12/12/2014     Principal Problem:   Sepsis Active Problems:   Moderate aortic regurgitation   CKD (chronic kidney disease), stage IV   Acute on chronic combined systolic and diastolic CHF (congestive heart failure)   Obese   Hypertension   Diabetes   Acalculous cholecystitis   SOB (shortness of breath)   Fever   Sleep apnea   Hypothyroid   Panhypopituitarism   Abdominal pain    SUBJECTIVE  Breathing improved.  Diuretics held yesterday in prep for R and L cath today. Slept in recliner but says he's been doing that for some time now, since his last seizure - so not necessarily r/t orthopnea.  No chest pain.  Renal fxn stable.  CURRENT MEDS . antiseptic oral rinse  7 mL Mouth Rinse BID  . aspirin EC  325 mg Oral Daily  . atorvastatin  40 mg Oral q1800  . budesonide-formoterol  2 puff Inhalation BID  . ciprofloxacin  250 mg Oral BID  . cloNIDine  0.2 mg Oral TID  . desmopressin  0.2 mg Oral BID  . dexamethasone  0.5 mg Oral Daily  . ferric gluconate (FERRLECIT/NULECIT) IV  250 mg Intravenous Q M,W,F  . heparin  5,000 Units Subcutaneous 3 times per day  . insulin aspart  0-9 Units Subcutaneous TID WC  . levothyroxine  175 mcg Oral QAC breakfast  . metroNIDAZOLE  500 mg Oral 3 times per day  . phenytoin  200 mg Oral Once per day on Mon Thu  . phenytoin  300 mg Oral Once per day on Sun Tue Wed Fri Sat  . sodium bicarbonate  650 mg Oral BID  . sodium chloride  3 mL Intravenous Q12H  . sodium chloride  3 mL Intravenous Q12H  . tiotropium  18 mcg Inhalation Daily    OBJECTIVE  Filed Vitals:   12/11/14 2104 12/12/14 0500 12/12/14 0753 12/12/14 0754  BP: 133/49 124/39    Pulse: 75 80    Temp: 97.7 F (36.5 C) 97.6 F (36.4 C)    TempSrc: Oral     Resp: 19 22    Height:      Weight:  276 lb 1.6 oz (125.238 kg)    SpO2: 98% 96% 99% 99%    Intake/Output Summary (Last 24 hours) at 12/12/14 0940 Last data filed at  12/12/14 7867  Gross per 24 hour  Intake    360 ml  Output    400 ml  Net    -40 ml   Filed Weights   12/10/14 0641 12/11/14 0507 12/12/14 0500  Weight: 274 lb 1.6 oz (124.331 kg) 274 lb 14.4 oz (124.694 kg) 276 lb 1.6 oz (125.238 kg)    PHYSICAL EXAM  General: Pleasant, NAD. Neuro: Alert and oriented X 3. Moves all extremities spontaneously. Psych: Normal affect. HEENT: Normal Neck: Supple without bruits. JVP difficult to ascertain 2/2 girth. Lungs: Resp regular and unlabored, bibasilar crackles. Heart: RRR decreased S2 no s3, s4, 2/6 SEM/DM @ RUSB. Abdomen: Firm, non-tender, distended, BS + x 4.  Extremities: No clubbing, cyanosis. 1  bilat LE edema. DP/PT/Radials 1+ and equal bilaterally.  Accessory Clinical Findings  CBC  Recent Labs  12/11/14 0347  WBC 7.0  HGB 11.6*  HCT 38.0*  MCV 87.2  PLT 672   Basic Metabolic Panel  Recent Labs  12/11/14 0347 12/12/14 0400  NA 135 136  K 5.1 4.9  CL 106 105  CO2 19 17*  GLUCOSE 97 82  BUN 68* 74*  CREATININE 2.69* 2.68*  CALCIUM 8.7 8.7  PHOS 4.2 4.9*   Liver Function Tests  Recent Labs  12/11/14 0347 12/12/14 0400  ALBUMIN 3.3* 3.3*   TELE  RSR  ASSESSMENT AND PLAN  1. Acute on chronic combined systolic and diastolic heart failure: Lasix currently on hold in prep for cath today. Weight 274 lbs  275 lbs, w/ net neg of 4.1 L for this admission so far (net + yesterday). Renal fxn stable.  Plan to resume lasix post-cath.  He has been sleeping in recliner for some time.  I've asked him to lie in bed this AM to assess his ability to lie flat for cath.  2. Sepsis: Currently on vancomycin and Zosyn. Blood cultures negative. WBC normal. Management per hospitalist service.   3. Acute on chronic stage 4 kidney disease: Creat stable this AM. Renal following. Lasix on hold for cath with plan to resume post-cath.  4. Essential HTN: Controlled on present therapy which includes clonidine. On amlodipine  as outpatient.   5. Abdominal pain, nausea, vomiting, fever: Resolved.  HIDA negative. On Abx.   6. Chest pain: Resolved. With decline in LV fxn and mod-severe AI, R & L heart cath today.  7. NSVT:  Quiescent. Noted on telemetry 1.2.16. Asymptomatic at time. K and Mg normal.   8. Mod to Severe Aortic insufficiency: As above, awaiting R & L heart cath. Will likely require AVR.  Signed, [name] NP [name] seen and examined   Agree with findings as noted above by [place] followed by [name]  [name] concerned that AI is starting to have a detrimental effect on LV function.  Consider preop Plan to hydrate today  Lasix on hold   Plan for careful assessment of anatomy and R heart pressures   [name]

## 2014-12-12 NOTE — Progress Notes (Signed)
Physical Therapy Treatment Patient Details Name: [name] [name] MRN: [id number] DOB: 01-Dec-1946 Today's Date: 12/12/2014    History of Present Illness Patient is a 69 yo male admitted 12/04/14 with CP, SOB, fever, RUQ pain.  Patient with sepsis, CHF, CKD, cholecystitis.  PMH:  CKD, HTN, DM, severe aortic insufficiency, declining LV function, obesity, OSA.    PT Comments    Pt demonstrating improved endurance compared to previous session. Pt educated on the importance of ambulating a little further everyday. Pt on 2L O2 at baseline and required increased to 4L O2 during ambulation and was still only able to maintain SpO2 between 82-86%. Pt requiring 1x standing rest break towards end of ambulation. Pt will benefit from continued acute PT focusing on endurance, safety and ambulation and balance.    Follow Up Recommendations  SNF;Supervision/Assistance - 24 hour     Equipment Recommendations  3in1 (PT)    Recommendations for Other Services       Precautions / Restrictions Precautions Precautions: Fall Restrictions Weight Bearing Restrictions: No    Mobility  Bed Mobility               General bed mobility comments: Pt sitting in recliner at start of session; did not assess  Transfers Overall transfer level: Needs assistance Equipment used: Rolling walker (2 wheeled) Transfers: Sit to/from Stand Sit to Stand: Supervision         General transfer comment: Pt able to safely stand from recliner x1 with supervision for safety. Pt did not require any cuing.   Ambulation/Gait Ambulation/Gait assistance: Supervision Ambulation Distance (Feet): 80 Feet Assistive device: Rolling walker (2 wheeled) Gait Pattern/deviations: Step-through pattern;Decreased stride length   Gait velocity interpretation: Below normal speed for age/gender General Gait Details: Pt able to safely ambulate with RW without instability or loss of balance. Pt ambulating on supplemental oxygen (see  Qualification note). Pt took 1x standing rest break at the end of ambulation.    Stairs            Wheelchair Mobility    Modified Rankin (Stroke Patients Only)       Balance Overall balance assessment: Needs assistance         Standing balance support: During functional activity;Bilateral upper extremity supported Standing balance-Leahy Scale: Fair Standing balance comment: Pt able to maintain static standing balance without support but requires RW for dynamic standing balance.                     Cognition Arousal/Alertness: Awake/alert Behavior During Therapy: Flat affect Overall Cognitive Status: Within Functional Limits for tasks assessed                      Exercises      General Comments        Pertinent Vitals/Pain Pain Assessment: No/denies pain    Home Living                      Prior Function            PT Goals (current goals can now be found in the care plan section) Progress towards PT goals: Progressing toward goals    Frequency  Min 3X/week    PT Plan Current plan remains appropriate    Co-evaluation             End of Session Equipment Utilized During Treatment: Gait belt;Oxygen Activity Tolerance: Patient tolerated treatment well Patient left: in chair;with call  bell/phone within reach;with family/visitor present     Time: 4580-9983 PT Time Calculation (min) (ACUTE ONLY): 23 min  Charges:  $Gait Training: 8-22 mins                    G CodesJearld Shines SPT 12/12/2014, 11:37 AM  Jearld Shines, SPT  Acute Rehabilitation [phone]

## 2014-12-12 NOTE — CV Procedure (Signed)
    Cardiac Catheterization Procedure Note  Name: [name] [name] MRN: [id number] DOB: 1946/04/23  Procedure: Right Heart Cath, Left Heart Cath, Selective Coronary Angiography  Indication: Severe AI, possible pre-AVR   Procedural Details: The right radial and brachial area were prepped, draped, and anesthetized with 1% lidocaine. 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  This was replaced with a 5 French venous sheath. A Swan-Ganz catheter was used for the right heart catheterization. Standard protocol was followed for recording of right heart pressures and sampling of oxygen saturations. Fick cardiac output was calculated. The right radial artery was entered using modified Seldinger technique and a 6 French arterial sheath was placed.  Coronary arteries were difficult to engage due to aortic root and ascending aorta aneurysm. The RCA was engaged with a long JR4 catheter.  The LCA was engaged with a JL5 guide.  There were no immediate procedural complications. The patient was transferred to the post catheterization recovery area for further monitoring.  Procedural Findings: Hemodynamics (mmHg) RA mean 22 RV 84/21 PA 93/35, mean 53 PCWP mean PCWP 33 LV 117/40 AO 121/54  Oxygen saturations: PA 48% AO 98%  Cardiac Output (Fick) 4.16  Cardiac Index (Fick) 1.68 I am not sure of the accuracy of the Fick CO as the PA and aortic saturations were not measured simultaneously and the patient's oxygen saturation fluctuated significantly.    Coronary angiography: Coronary dominance: right  Left mainstem: 20% proximal LM stenosis.   Left anterior descending (LAD): Luminal irregularities.  Left circumflex (LCx): Luminal irregularities.   Right coronary artery (RCA): Luminal irregularities.   Left ventriculography: Not done, CKD.  Contrast: 110 cc  Final Conclusions:  Severely elevated right and left heart filling pressures.  Wide aortic pulse pressure suggestive  of significant aortic insufficiency. I am not sure of the accuracy of the Fick CO as the PA and aortic saturations were not measured simultaneously and the patient's oxygen saturation fluctuated significantly.  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  No significant coronary disease.   Recommendations: I am not giving him any post-cath fluid with elevated filling pressures.  If creatinine stable, restart aggressive diuresis in the morning.  If creatinine rises, will need dialysis for volume removal.  Needs lower filling pressures prior to any consideration for valve surgery.   Loralie Champagne 12/12/2014, 6:04 PM

## 2014-12-12 NOTE — Progress Notes (Addendum)
Patient Name: [name] [name] Date of Encounter: 12/12/2014     Principal Problem:   Sepsis Active Problems:   Moderate aortic regurgitation   CKD (chronic kidney disease), stage IV   Acute on chronic combined systolic and diastolic CHF (congestive heart failure)   Obese   Hypertension   Diabetes   Acalculous cholecystitis   SOB (shortness of breath)   Fever   Sleep apnea   Hypothyroid   Panhypopituitarism   Abdominal pain    SUBJECTIVE  Breathing improved.  Diuretics held yesterday in prep for R and L cath today. Slept in recliner but says [name] [name]'s been doing that for some time now, since his last seizure - so not necessarily r/t orthopnea.  No chest pain.  Renal fxn stable.  CURRENT MEDS . antiseptic oral rinse  7 mL Mouth Rinse BID  . aspirin EC  325 mg Oral Daily  . atorvastatin  40 mg Oral q1800  . budesonide-formoterol  2 puff Inhalation BID  . ciprofloxacin  250 mg Oral BID  . cloNIDine  0.2 mg Oral TID  . desmopressin  0.2 mg Oral BID  . dexamethasone  0.5 mg Oral Daily  . ferric gluconate (FERRLECIT/NULECIT) IV  250 mg Intravenous Q M,W,F  . heparin  5,000 Units Subcutaneous 3 times per day  . insulin aspart  0-9 Units Subcutaneous TID WC  . levothyroxine  175 mcg Oral QAC breakfast  . metroNIDAZOLE  500 mg Oral 3 times per day  . phenytoin  200 mg Oral Once per day on Mon Thu  . phenytoin  300 mg Oral Once per day on Sun Tue Wed Fri Sat  . sodium bicarbonate  650 mg Oral BID  . sodium chloride  3 mL Intravenous Q12H  . sodium chloride  3 mL Intravenous Q12H  . tiotropium  18 mcg Inhalation Daily    OBJECTIVE  Filed Vitals:   12/11/14 2104 12/12/14 0500 12/12/14 0753 12/12/14 0754  BP: 133/49 124/39    Pulse: 75 80    Temp: 97.7 F (36.5 C) 97.6 F (36.4 C)    TempSrc: Oral     Resp: 19 22    Height:      Weight:  276 lb 1.6 oz (125.238 kg)    SpO2: 98% 96% 99% 99%    Intake/Output Summary (Last 24 hours) at 12/12/14 0940 Last data filed at  12/12/14 9371  Gross per 24 hour  Intake    360 ml  Output    400 ml  Net    -40 ml   Filed Weights   12/10/14 0641 12/11/14 0507 12/12/14 0500  Weight: 274 lb 1.6 oz (124.331 kg) 274 lb 14.4 oz (124.694 kg) 276 lb 1.6 oz (125.238 kg)    PHYSICAL EXAM  General: Pleasant, NAD. Neuro: Alert and oriented X 3. Moves all extremities spontaneously. Psych: Normal affect. HEENT: Normal Neck: Supple without bruits. JVP difficult to ascertain 2/2 girth. Lungs: Resp regular and unlabored, bibasilar crackles. Heart: RRR decreased S2 no s3, s4, 2/6 SEM/DM @ RUSB. Abdomen: Firm, non-tender, distended, BS + x 4.  Extremities: No clubbing, cyanosis. 1  bilat LE edema. DP/PT/Radials 1+ and equal bilaterally.  Accessory Clinical Findings  CBC  Recent Labs  12/11/14 0347  WBC 7.0  HGB 11.6*  HCT 38.0*  MCV 87.2  PLT 696   Basic Metabolic Panel  Recent Labs  12/11/14 0347 12/12/14 0400  NA 135 136  K 5.1 4.9  CL 106 105  CO2 19 17*  GLUCOSE 97 82  BUN 68* 74*  CREATININE 2.69* 2.68*  CALCIUM 8.7 8.7  PHOS 4.2 4.9*   Liver Function Tests  Recent Labs  12/11/14 0347 12/12/14 0400  ALBUMIN 3.3* 3.3*   TELE  RSR  ASSESSMENT AND PLAN  1. Acute on chronic combined systolic and diastolic heart failure: Lasix currently on hold in prep for cath today. Weight 274 lbs  275 lbs, w/ net neg of 4.1 L for this admission so far (net + yesterday). Renal fxn stable.  Plan to resume lasix post-cath.  [name] [name] has been sleeping in recliner for some time.  I've asked him to lie in bed this AM to assess his ability to lie flat for cath.  2. Sepsis: Currently on vancomycin and Zosyn. Blood cultures negative. WBC normal. Management per hospitalist service.   3. Acute on chronic stage 4 kidney disease: Creat stable this AM. Renal following. Lasix on hold for cath with plan to resume post-cath.  4. Essential HTN: Controlled on present therapy which includes clonidine. On amlodipine  as outpatient.   5. Abdominal pain, nausea, vomiting, fever: Resolved.  HIDA negative. On Abx.   6. Chest pain: Resolved. With decline in LV fxn and mod-severe AI, R & L heart cath today.  7. NSVT:  Quiescent. Noted on telemetry 1.2.16. Asymptomatic at time. K and Mg normal.   8. Mod to Severe Aortic insufficiency: As above, awaiting R & L heart cath. Will likely require AVR.  Signed, [name] Hodgkins NP [name] [name] seen and examined   Agree with findings as noted above by [name] [name] followed by [name] [name]  [name] [name] is concerned that AI is starting to have a detrimental effect on LV function.  Consider preop Plan to hydrate today  Lasix on hold   Plan for careful assessment of anatomy and R heart pressures   [name] [name]

## 2014-12-12 NOTE — Progress Notes (Signed)
Patient Demographics  [name] [name], is a 69 y.o. male, DOB - 1946/04/01, ZJI:[id number]  Admit date - 12/04/2014   Admitting Physician Troy Sine, MD  Outpatient Primary MD for the patient is [name], MD  LOS - 8   Chief Complaint  Patient presents with  . Shortness of Breath      Admission history of present illness/brief narrative: 69 year-old gentleman presenting to the Emergency Dept today with multiple complaints. These include chest pain, shortness of breath, and RUQ pain. He has been previously scheduled for elective cardiac catheterization 12/29 to evaluate severe aortic insufficiency and declining LV function. However, the patient came to the emergency department today because he has noticed an acute change in his symptoms over the last 24 hours. He complains of aching all over. He also has had some nausea and vomiting. He complains of pain in his upper abdominal area. He has also had chest pain. His breathing has been worse over the last 2-3 days. He does feel that his "retaining more fluid." He admits to dietary indiscretion with salt. He has been taking his medications. Right upper quadrant ultrasound was done and there concern about acalculous cholecystitis general surgery recommended HIDA scan which was negative.    Assessment & Plan    Principal Problem:   Sepsis Active Problems:   Obese   Hypertension   Sleep apnea   Hypothyroid   Panhypopituitarism   Moderate aortic regurgitation   Diabetes   CKD (chronic kidney disease), stage IV   Acalculous cholecystitis   Abdominal pain   Acute on chronic combined systolic and diastolic CHF (congestive heart failure)   SOB (shortness of breath)   Fever   Aortic insufficiency   Chest pain: Awaiting cath.  Abdominal pain, nausea, vomiting, HIDA negative.   Fever -BC x2 NGTD -vanc/zosyn- transition over to PO cipro/flagyl for a 10 days course -CT scan abd show  inflammation- tender in RUQ is gone after ABX -outpatient surgery follow up  Acute on chronic systolic/diastolic heart failure,  -LVEF 40-45 percent with diffuse hypokinesis, diastolic dysfunction, moderate to severe aortic regurgitation, severe pulmonary hypertension by echocardiogram 12/10. Lasix held for cath  Panhypopituitarism -Status post pituitary excision, continue with desmopressin, Decadron, Synthroid.  Seizure disorder -Status post craniectomy, continue with phenytoin. Level ok on admission  Diabetes mellitus controlled  COPD -No active wheezing, on 2 L at baseline, appears stable  chronic renal failure-stage IV May require dialysis soon.  Code Status: Full  Family Communication:   Disposition Plan: Remains on telemetry   Procedures  None   Consults   Cardiology nephrology Surgery   Medications  Scheduled Meds: . antiseptic oral rinse  7 mL Mouth Rinse BID  . aspirin EC  325 mg Oral Daily  . atorvastatin  40 mg Oral q1800  . budesonide-formoterol  2 puff Inhalation BID  . ciprofloxacin  250 mg Oral BID  . cloNIDine  0.2 mg Oral TID  . desmopressin  0.2 mg Oral BID  . dexamethasone  0.5 mg Oral Daily  . ferric gluconate (FERRLECIT/NULECIT) IV  250 mg Intravenous Q M,W,F  . heparin  5,000 Units Subcutaneous 3 times per day  . insulin aspart  0-9 Units Subcutaneous TID WC  . levothyroxine  175 mcg Oral QAC breakfast  . metroNIDAZOLE  500 mg Oral 3 times per day  . phenytoin  200 mg Oral Once per day on Mon Thu  . phenytoin  300 mg Oral Once per day on Sun Tue Wed Fri Sat  . sodium bicarbonate  650 mg Oral BID  . sodium chloride  3 mL Intravenous Q12H  . sodium chloride  3 mL Intravenous Q12H  . [START ON 12/13/2014] sodium chloride  3 mL Intravenous Q12H  . tiotropium  18 mcg Inhalation Daily   Continuous Infusions: . sodium chloride     PRN Meds:.sodium chloride, sodium chloride, acetaminophen, acetaminophen, albuterol, magic mouthwash,  ondansetron (ZOFRAN) IV, ondansetron, sodium chloride, sodium chloride, sodium chloride  DVT Prophylaxis   Heparin -  Lab Results  Component Value Date   PLT 197 12/11/2014    Antibiotics    Anti-infectives    Start     Dose/Rate Route Frequency Ordered Stop   12/11/14 2200  ciprofloxacin (CIPRO) tablet 250 mg     250 mg Oral 2 times daily 12/11/14 2010     12/11/14 2200  metroNIDAZOLE (FLAGYL) tablet 500 mg     500 mg Oral 3 times per day 12/11/14 2010     12/09/14 1400  metroNIDAZOLE (FLAGYL) tablet 500 mg  Status:  Discontinued     500 mg Oral 3 times per day 12/09/14 1111 12/11/14 1509   12/09/14 1200  amoxicillin-clavulanate (AUGMENTIN) 875-125 MG per tablet 1 tablet  Status:  Discontinued     1 tablet Oral Every 12 hours 12/09/14 1111 12/09/14 1124   12/09/14 1200  ciprofloxacin (CIPRO) tablet 500 mg  Status:  Discontinued     500 mg Oral 2 times daily 12/09/14 1124 12/11/14 1509   12/08/14 0900  vancomycin (VANCOCIN) 1,750 mg in sodium chloride 0.9 % 500 mL IVPB  Status:  Discontinued     1,750 mg250 mL/hr over 120 Minutes Intravenous Every 48 hours 12/06/14 0753 12/09/14 1111   12/06/14 1500  piperacillin-tazobactam (ZOSYN) IVPB 3.375 g  Status:  Discontinued     3.375 g12.5 mL/hr over 240 Minutes Intravenous Every 8 hours 12/06/14 0748 12/09/14 1111   12/06/14 0800  piperacillin-tazobactam (ZOSYN) IVPB 3.375 g     3.375 g100 mL/hr over 30 Minutes Intravenous  Once 12/06/14 0747 12/06/14 0921   12/06/14 0800  vancomycin (VANCOCIN) 2,000 mg in sodium chloride 0.9 % 500 mL IVPB     2,000 mg250 mL/hr over 120 Minutes Intravenous  Once 12/06/14 0747 12/06/14 1211   12/06/14 0700  cefTRIAXone (ROCEPHIN) 1 g in dextrose 5 % 50 mL IVPB - Premix  Status:  Discontinued     1 g100 mL/hr over 30 Minutes Intravenous  Once 12/06/14 0658 12/06/14 0748   12/04/14 1830  cefTRIAXone (ROCEPHIN) 1 g in dextrose 5 % 50 mL IVPB - Premix  Status:  Discontinued     1 g100 mL/hr over 30 Minutes  Intravenous Every 24 hours 12/04/14 1727 12/05/14 1717      Subjective: no dyspnea.    Objective:   Filed Vitals:   12/12/14 1825 12/12/14 1910 12/12/14 1940 12/12/14 2004  BP: 133/52 159/102 150/53   Pulse: 68 72 78   Temp:      TempSrc:      Resp: 11 19 23    Height:      Weight:      SpO2: 98% 98% 96% 97%    Wt Readings from Last 3 Encounters:  12/12/14 125.238 kg (  276 lb 1.6 oz)  11/16/14 127.007 kg (280 lb)  06/12/14 121.201 kg (267 lb 3.2 oz)     Intake/Output Summary (Last 24 hours) at 12/12/14 2012 Last data filed at 12/12/14 1139  Gross per 24 hour  Intake      0 ml  Output    600 ml  Net   -600 ml     Physical Exam  Awake in chair a and o Symmetrical Chest wall movement, Good air movement bilaterally, CTAB RRR,No Gallops,Rubs or new Murmurs, No Parasternal Heave +ve B.Sounds, obese +2edema   Data Review   Micro Results Recent Results (from the past 240 hour(s))  MRSA PCR Screening     Status: None   Collection Time: 12/04/14  5:44 PM  Result Value Ref Range Status   MRSA by PCR NEGATIVE NEGATIVE Final    Comment:        The GeneXpert MRSA Assay (FDA approved for NASAL specimens only), is one component of a comprehensive MRSA colonization surveillance program. It is not intended to diagnose MRSA infection nor to guide or monitor treatment for MRSA infections.   Culture, blood (routine x 2)     Status: None   Collection Time: 12/06/14  8:45 AM  Result Value Ref Range Status   Specimen Description BLOOD LEFT ARM  Final   Special Requests BOTTLES DRAWN AEROBIC AND ANAEROBIC 10CC  Final   Culture   Final    NO GROWTH 5 DAYS Note: Culture results may be compromised due to an inadequate volume of blood received in culture bottles. Performed at [facility]    Report Status 12/12/2014 FINAL  Final  Culture, blood (routine x 2)     Status: None   Collection Time: 12/06/14  8:50 AM  Result Value Ref Range Status   Specimen  Description BLOOD RIGHT HAND  Final   Special Requests BOTTLES DRAWN AEROBIC ONLY 10CC  Final   Culture   Final    NO GROWTH 5 DAYS Note: Culture results may be compromised due to an excessive volume of blood received in culture bottles. Performed at [facility]    Report Status 12/12/2014 FINAL  Final  Urine culture     Status: None   Collection Time: 12/06/14 10:30 PM  Result Value Ref Range Status   Specimen Description URINE, CLEAN CATCH  Final   Special Requests Normal  Final   Colony Count NO GROWTH Performed at [facility]   Final   Culture NO GROWTH Performed at [facility]   Final   Report Status 12/10/2014 FINAL  Final  Clostridium Difficile by PCR     Status: None   Collection Time: 12/06/14 10:42 PM  Result Value Ref Range Status   C difficile by pcr NEGATIVE NEGATIVE Final  Clostridium Difficile by PCR     Status: None   Collection Time: 12/10/14  1:19 AM  Result Value Ref Range Status   C difficile by pcr NEGATIVE NEGATIVE Final    Radiology Reports No results found.  CBC  Recent Labs Lab 12/06/14 0319 12/08/14 0455 12/11/14 0347  WBC 6.1 7.1 7.0  HGB 11.4* 11.3* 11.6*  HCT 38.2* 36.5* 38.0*  PLT 183 179 197  MCV 88.4 86.7 87.2  MCH 26.4 26.8 26.6  MCHC 29.8* 31.0 30.5  RDW 17.3* 17.1* 17.2*    Chemistries   Recent Labs Lab 12/06/14 0319  12/08/14 0455 12/09/14 0400 12/10/14 0315 12/11/14 0347 12/12/14 0400  NA 138  < >  136 137 137 135 136  K 4.7  < > 4.4 4.3 4.5 5.1 4.9  CL 103  < > 104 105 105 106 105  CO2 26  < > 13* 20 21 19  17*  GLUCOSE 90  < > 103* 121* 96 97 82  BUN 54*  < > 60* 65* 66* 68* 74*  CREATININE 2.69*  < > 2.97* 2.85* 2.74* 2.69* 2.68*  CALCIUM 8.7  < > 8.5 8.3* 8.7 8.7 8.7  MG  --   --   --  2.5  --   --   --   AST 17  --   --   --   --   --   --   ALT 12  --   --   --   --   --   --   ALKPHOS 55  --   --   --   --   --   --   BILITOT 0.8  --   --   --   --   --   --   < > = values in  this interval not displayed. ------------------------------------------------------------------------------------------------------------------ estimated creatinine clearance is 35.6 mL/min (by C-G formula based on Cr of 2.68). ------------------------------------------------------------------------------------------------------------------ No results for input(s): HGBA1C in the last 72 hours. ------------------------------------------------------------------------------------------------------------------ No results for input(s): CHOL, HDL, LDLCALC, TRIG, CHOLHDL, LDLDIRECT in the last 72 hours. ------------------------------------------------------------------------------------------------------------------ No results for input(s): TSH, T4TOTAL, T3FREE, THYROIDAB in the last 72 hours.  Invalid input(s): FREET3 ------------------------------------------------------------------------------------------------------------------ No results for input(s): VITAMINB12, FOLATE, FERRITIN, TIBC, IRON, RETICCTPCT in the last 72 hours.  Recent Labs  12/11/14 1158 12/11/14 1707 12/11/14 2109 12/12/14 0732 12/12/14 1145 12/12/14 1855  GLUCAP 144* 102* 91 84 98 89    Coagulation profile  Recent Labs Lab 12/12/14 0400  INR 1.50*    No results for input(s): DDIMER in the last 72 hours.  Cardiac Enzymes No results for input(s): CKMB, TROPONINI, MYOGLOBIN in the last 168 hours.  Invalid input(s): CK ------------------------------------------------------------------------------------------------------------------ Invalid input(s): POCBNP     Time Spent in minutes   25 minutes   Tank Difiore L MD on 12/12/2014 at 8:12 PM  [email or web address] - password [facility]   [facility]

## 2014-12-12 NOTE — Progress Notes (Signed)
Patient ID: [name] [name], male   DOB: 14-Aug-1946, 69 y.o.   MRN: [id number]  [facility] Progress Note    Assessment/ Plan:   1. Acute renal failure on chronic kidney disease stage IV in a patient with a solitary kidney: Renal function stable with acceptable electrolytes. Anticipated contrast exposure for coronary angiography today- precautions taken and risk explained to patient. Will monitor 48hr after for CINP. 2. Acute exacerbation of congestive heart failure-history of severe aortic insufficiency with an ejection fraction of 40-45%-intermittent furosemide with symptomatic improvement- restart after cath. 3. Panhypopituitarism: Ongoing replacement therapy 4. Morbid obesity-possibly with an element of OSA 5. Anemia of chronic disease: Hemoglobin acceptable but he has evidence of iron deficiency-low iron saturation/ferritin. Getting intravenous iron  Subjective:   Reports to be feeling fair and without acute complaints   Objective:   BP 124/39 mmHg  Pulse 80  Temp(Src) 97.6 F (36.4 C) (Oral)  Resp 22  Ht 6\' 1"  (1.854 m)  Wt 125.238 kg (276 lb 1.6 oz)  BMI 36.43 kg/m2  SpO2 99%  Intake/Output Summary (Last 24 hours) at 12/12/14 0911 Last data filed at 12/12/14 5427  Gross per 24 hour  Intake    360 ml  Output    400 ml  Net    -40 ml   Weight change: 0.544 kg (1 lb 3.2 oz)  Physical Exam: CWC:BJSEGBTDVVO resting in recliner HYW:VPXTG RRR, normal s1 and s2 Resp:CTA bilaterally, no rales/rhonchi GYI:RSWN, obese, NT, BS normal Ext:2-3+ LE edema  Imaging: No results found.  Labs: BMET  Recent Labs Lab 12/06/14 0319 12/07/14 0420 12/08/14 0455 12/09/14 0400 12/10/14 0315 12/11/14 0347 12/12/14 0400  NA 138 134* 136 137 137 135 136  K 4.7 4.2 4.4 4.3 4.5 5.1 4.9  CL 103 103 104 105 105 106 105  CO2 26 23 13* 20 21 19  17*  GLUCOSE 90 95 103* 121* 96 97 82  BUN 54* 56* 60* 65* 66* 68* 74*  CREATININE 2.69* 3.04* 2.97* 2.85* 2.74* 2.69* 2.68*   CALCIUM 8.7 8.2* 8.5 8.3* 8.7 8.7 8.7  PHOS  --   --  3.8 3.9 4.1 4.2 4.9*   CBC  Recent Labs Lab 12/06/14 0319 12/08/14 0455 12/11/14 0347  WBC 6.1 7.1 7.0  HGB 11.4* 11.3* 11.6*  HCT 38.2* 36.5* 38.0*  MCV 88.4 86.7 87.2  PLT 183 179 197    Medications:    . antiseptic oral rinse  7 mL Mouth Rinse BID  . aspirin EC  325 mg Oral Daily  . atorvastatin  40 mg Oral q1800  . budesonide-formoterol  2 puff Inhalation BID  . ciprofloxacin  250 mg Oral BID  . cloNIDine  0.2 mg Oral TID  . desmopressin  0.2 mg Oral BID  . dexamethasone  0.5 mg Oral Daily  . ferric gluconate (FERRLECIT/NULECIT) IV  250 mg Intravenous Q M,W,F  . heparin  5,000 Units Subcutaneous 3 times per day  . insulin aspart  0-9 Units Subcutaneous TID WC  . levothyroxine  175 mcg Oral QAC breakfast  . metroNIDAZOLE  500 mg Oral 3 times per day  . phenytoin  200 mg Oral Once per day on Mon Thu  . phenytoin  300 mg Oral Once per day on Sun Tue Wed Fri Sat  . sodium chloride  3 mL Intravenous Q12H  . sodium chloride  3 mL Intravenous Q12H  . tiotropium  18 mcg Inhalation Daily    Elmarie Shiley, MD 12/12/2014, 9:11 AM

## 2014-12-12 NOTE — Progress Notes (Signed)
Site area: rt [facility] Site Prior to Removal:  Level 0 Pressure Applied For: 10 minutes Manual:   yes Patient Status During Pull:  stable Post Pull Site:  Level 0 Post Pull Instructions Given:  yes Post Pull Pulses Present: yes Dressing Applied:  tegaderm Bedrest begins @ NA Comments: no complications

## 2014-12-12 NOTE — Interval H&P Note (Signed)
History and Physical Interval Note:  12/12/2014 4:38 PM  [name] [name]  has presented today for surgery, with the diagnosis of hf  The various methods of treatment have been discussed with the patient and family. After consideration of risks, benefits and other options for treatment, the patient has consented to  Procedure(s): LEFT AND RIGHT HEART CATHETERIZATION WITH CORONARY ANGIOGRAM (N/A) as a surgical intervention .  The patient's history has been reviewed, patient examined, no change in status, stable for surgery.  I have reviewed the patient's chart and labs.  Questions were answered to the patient's satisfaction.    Patient's creatinine is stable around 2.8.  Drs Harrington Challenger and Posey Pronto have extensively discussed risk of worsening renal failure including possible need of dialysis with contrast with cath.  I talked to him again about this prior to starting the procedure.  He understands this.  The cath is needed for coronary definition prior to possible aortic valve replacement.    [name] [name]

## 2014-12-12 NOTE — Progress Notes (Signed)
Rt AC venous sheath removed by [employer]

## 2014-12-13 DIAGNOSIS — E0829 Diabetes mellitus due to underlying condition with other diabetic kidney complication: Secondary | ICD-10-CM

## 2014-12-13 LAB — RENAL FUNCTION PANEL
ALBUMIN: 3.2 g/dL — AB (ref 3.5–5.2)
Anion gap: 10 (ref 5–15)
BUN: 74 mg/dL — ABNORMAL HIGH (ref 6–23)
CALCIUM: 8.5 mg/dL (ref 8.4–10.5)
CO2: 22 mmol/L (ref 19–32)
Chloride: 105 mEq/L (ref 96–112)
Creatinine, Ser: 2.76 mg/dL — ABNORMAL HIGH (ref 0.50–1.35)
GFR calc non Af Amer: 22 mL/min — ABNORMAL LOW (ref >90)
GFR, EST AFRICAN AMERICAN: 25 mL/min — AB (ref >90)
Glucose, Bld: 122 mg/dL — ABNORMAL HIGH (ref 70–99)
POTASSIUM: 4.5 mmol/L (ref 3.5–5.1)
Phosphorus: 4.7 mg/dL — ABNORMAL HIGH (ref 2.3–4.6)
Sodium: 137 mmol/L (ref 135–145)

## 2014-12-13 LAB — GLUCOSE, CAPILLARY
Glucose-Capillary: 103 mg/dL — ABNORMAL HIGH (ref 70–99)
Glucose-Capillary: 119 mg/dL — ABNORMAL HIGH (ref 70–99)
Glucose-Capillary: 81 mg/dL (ref 70–99)

## 2014-12-13 MED ORDER — FUROSEMIDE 10 MG/ML IJ SOLN
120.0000 mg | Freq: Three times a day (TID) | INTRAVENOUS | Status: DC
  Administered 2014-12-13 – 2014-12-17 (×13): 120 mg via INTRAVENOUS
  Filled 2014-12-13 (×16): qty 12

## 2014-12-13 MED ORDER — DIPHENHYDRAMINE HCL 25 MG PO CAPS: 25.0000 mg | ORAL_CAPSULE | ORAL | Status: DC | PRN

## 2014-12-13 MED ORDER — FUROSEMIDE 10 MG/ML IJ SOLN
80.0000 mg | Freq: Three times a day (TID) | INTRAMUSCULAR | Status: DC
  Administered 2014-12-13 (×2): 80 mg via INTRAVENOUS
  Filled 2014-12-13 (×2): qty 8

## 2014-12-13 MED ORDER — DIPHENHYDRAMINE HCL 25 MG PO CAPS
50.0000 mg | ORAL_CAPSULE | ORAL | Status: DC | PRN
  Administered 2014-12-13 – 2014-12-15 (×5): 50 mg via ORAL
  Filled 2014-12-13 (×5): qty 2

## 2014-12-13 NOTE — Progress Notes (Signed)
Patient Demographics  [name] [name], is a 69 y.o. male, DOB - January 20, 1946, ACZ:[id number]  Admit date - 12/04/2014   Admitting Physician Troy Sine, MD  Outpatient Primary MD for the patient is [facility] [place], [place]  LOS - 9   Chief Complaint  Patient presents with  . Shortness of Breath      Admission history of present illness/brief narrative: 69 year-old gentleman presenting to the Emergency Dept today with multiple complaints. These include chest pain, shortness of breath, and RUQ pain. He has been previously scheduled for elective cardiac catheterization 12/29 to evaluate severe aortic insufficiency and declining LV function. However, the patient came to the emergency department today because he has noticed an acute change in his symptoms over the last 24 hours. He complains of aching all over. He also has had some nausea and vomiting. He complains of pain in his upper abdominal area. He has also had chest pain. His breathing has been worse over the last 2-3 days. He does feel that his "retaining more fluid." He admits to dietary indiscretion with salt. He has been taking his medications. Right upper quadrant ultrasound was done and there concern about acalculous cholecystitis general surgery recommended HIDA scan which was negative.    Assessment & Plan    Principal Problem:   Sepsis Active Problems:   Obese   Hypertension   Sleep apnea   Hypothyroid   Panhypopituitarism   Moderate aortic regurgitation   Diabetes   CKD (chronic kidney disease), stage IV   Acalculous cholecystitis   Abdominal pain   Acute on chronic combined systolic and diastolic CHF (congestive heart failure)   SOB (shortness of breath)   Fever   Aortic insufficiency   Chest pain: Cath without significant CAD. Severely elevated right and left heart filling pressures. 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  Abdominal pain, nausea, vomiting, HIDA negative.   Fever -BC x2 NGTD -vanc/zosyn- transition over to PO cipro/flagyl for a 10 days course -CT scan abd show inflammation- tender in RUQ is gone after ABX -outpatient surgery follow up  Acute on chronic systolic/diastolic heart failure,  -LVEF 40-45 percent with diffuse hypokinesis, diastolic dysfunction, moderate to severe aortic regurgitation, severe pulmonary hypertension. Massively fluid overloaded. Periods of labored breathing per RN and not much UOP. Will increase lasix to 120 tid. Will likley need HD  Panhypopituitarism desmopressin, Decadron, Synthroid.  Seizure disorder phenytoin. Level ok on admission  Diabetes mellitus controlled  COPD -No active wheezing, on 2 L at baseline, appears stable  chronic renal failure-stage IV May require dialysis soon.  Code Status: Full  Family Communication:   Disposition Plan: Remains on telemetry   Procedures  Cardiac cath  Consults   Cardiology nephrology Surgery TCTS  Medications  Scheduled Meds: . antiseptic oral rinse  7 mL Mouth Rinse BID  . aspirin EC  325 mg Oral Daily  . atorvastatin  40 mg Oral q1800  . budesonide-formoterol  2 puff Inhalation BID  . ciprofloxacin  250 mg Oral BID  . cloNIDine  0.2 mg Oral TID  . desmopressin  0.2 mg Oral BID  . dexamethasone  0.5 mg Oral Daily  . ferric gluconate (FERRLECIT/NULECIT) IV  250 mg Intravenous Q M,W,F  . furosemide  120 mg Intravenous TID  . heparin  5,000 Units Subcutaneous 3 times per day  . insulin aspart  0-9 Units Subcutaneous TID WC  . levothyroxine  175 mcg Oral QAC breakfast  . metroNIDAZOLE  500 mg Oral 3 times per day  . phenytoin  200 mg Oral Once per day on Mon Thu  . phenytoin  300 mg Oral Once per day on Sun Tue Wed Fri Sat  . sodium bicarbonate  650 mg Oral BID  . sodium chloride  3 mL Intravenous Q12H  . sodium chloride  3 mL Intravenous Q12H  .  sodium chloride  3 mL Intravenous Q12H  . tiotropium  18 mcg Inhalation Daily   Continuous Infusions: . sodium chloride     PRN Meds:.sodium chloride, sodium chloride, acetaminophen, acetaminophen, albuterol, diphenhydrAMINE, magic mouthwash, ondansetron (ZOFRAN) IV, ondansetron, sodium chloride, sodium chloride, sodium chloride  DVT Prophylaxis   Heparin -  Lab Results  Component Value Date   PLT 197 12/11/2014    Antibiotics    Anti-infectives    Start     Dose/Rate Route Frequency Ordered Stop   12/11/14 2200  ciprofloxacin (CIPRO) tablet 250 mg     250 mg Oral 2 times daily 12/11/14 2010     12/11/14 2200  metroNIDAZOLE (FLAGYL) tablet 500 mg     500 mg Oral 3 times per day 12/11/14 2010     12/09/14 1400  metroNIDAZOLE (FLAGYL) tablet 500 mg  Status:  Discontinued     500 mg Oral 3 times per day 12/09/14 1111 12/11/14 1509   12/09/14 1200  amoxicillin-clavulanate (AUGMENTIN) 875-125 MG per tablet 1 tablet  Status:  Discontinued     1 tablet Oral Every 12 hours 12/09/14 1111 12/09/14 1124   12/09/14 1200  ciprofloxacin (CIPRO) tablet 500 mg  Status:  Discontinued     500 mg Oral 2 times daily 12/09/14 1124 12/11/14 1509   12/08/14 0900  vancomycin (VANCOCIN) 1,750 mg in sodium chloride 0.9 % 500 mL IVPB  Status:  Discontinued     1,750 mg250 mL/hr over 120 Minutes Intravenous Every 48 hours 12/06/14 0753 12/09/14 1111   12/06/14 1500  piperacillin-tazobactam (ZOSYN) IVPB 3.375 g  Status:  Discontinued     3.375 g12.5 mL/hr over 240 Minutes Intravenous Every 8 hours 12/06/14 0748 12/09/14 1111   12/06/14 0800  piperacillin-tazobactam (ZOSYN) IVPB 3.375 g     3.375 g100 mL/hr over 30 Minutes Intravenous  Once 12/06/14 0747 12/06/14 0921   12/06/14 0800  vancomycin (VANCOCIN) 2,000 mg in sodium chloride 0.9 % 500 mL IVPB     2,000 mg250 mL/hr over 120 Minutes Intravenous  Once 12/06/14 0747 12/06/14 1211   12/06/14 0700  cefTRIAXone (ROCEPHIN) 1 g in dextrose 5 % 50 mL IVPB -  Premix  Status:  Discontinued     1 g100 mL/hr over 30 Minutes Intravenous  Once 12/06/14 0658 12/06/14 0748   12/04/14 1830  cefTRIAXone (ROCEPHIN) 1 g in dextrose 5 % 50 mL IVPB - Premix  Status:  Discontinued     1 g100 mL/hr over 30 Minutes Intravenous Every 24 hours 12/04/14 1727 12/05/14 1717      Subjective: periods of dyspnea. No nausea.    Objective:   Filed Vitals:   12/12/14 2355 12/13/14 0500 12/13/14 1200 12/13/14 1600  BP: 129/48 138/51    Pulse: 79 76 85 73  Temp:  98 F (36.7 C)  TempSrc:  Oral    Resp: 21 19 25 14   Height:      Weight:  126.871 kg (279 lb 11.2 oz)    SpO2: 97% 99% 97% 100%    Wt Readings from Last 3 Encounters:  12/13/14 126.871 kg (279 lb 11.2 oz)  11/16/14 127.007 kg (280 lb)  06/12/14 121.201 kg (267 lb 3.2 oz)     Intake/Output Summary (Last 24 hours) at 12/13/14 1743 Last data filed at 12/13/14 1200  Gross per 24 hour  Intake   1648 ml  Output   1525 ml  Net    123 ml     Physical Exam  Awake in chair a and o Symmetrical Chest wall movement, Good air movement bilaterally, CTAB RRR,No Gallops,Rubs or new Murmurs, No Parasternal Heave +ve B.Sounds, obese +2edema   Data Review   Micro Results Recent Results (from the past 240 hour(s))  MRSA PCR Screening     Status: None   Collection Time: 12/04/14  5:44 PM  Result Value Ref Range Status   MRSA by PCR NEGATIVE NEGATIVE Final    Comment:        The GeneXpert MRSA Assay (FDA approved for NASAL specimens only), is one component of a comprehensive MRSA colonization surveillance program. It is not intended to diagnose MRSA infection nor to guide or monitor treatment for MRSA infections.   Culture, blood (routine x 2)     Status: None   Collection Time: 12/06/14  8:45 AM  Result Value Ref Range Status   Specimen Description BLOOD LEFT ARM  Final   Special Requests BOTTLES DRAWN AEROBIC AND ANAEROBIC 10CC  Final   Culture   Final    NO GROWTH 5 DAYS Note:  Culture results may be compromised due to an inadequate volume of blood received in culture bottles. Performed at [facility]    Report Status 12/12/2014 FINAL  Final  Culture, blood (routine x 2)     Status: None   Collection Time: 12/06/14  8:50 AM  Result Value Ref Range Status   Specimen Description BLOOD RIGHT HAND  Final   Special Requests BOTTLES DRAWN AEROBIC ONLY 10CC  Final   Culture   Final    NO GROWTH 5 DAYS Note: Culture results may be compromised due to an excessive volume of blood received in culture bottles. Performed at [facility]    Report Status 12/12/2014 FINAL  Final  Urine culture     Status: None   Collection Time: 12/06/14 10:30 PM  Result Value Ref Range Status   Specimen Description URINE, CLEAN CATCH  Final   Special Requests Normal  Final   Colony Count NO GROWTH Performed at [facility]   Final   Culture NO GROWTH Performed at [facility]   Final   Report Status 12/10/2014 FINAL  Final  Clostridium Difficile by PCR     Status: None   Collection Time: 12/06/14 10:42 PM  Result Value Ref Range Status   C difficile by pcr NEGATIVE NEGATIVE Final  Clostridium Difficile by PCR     Status: None   Collection Time: 12/10/14  1:19 AM  Result Value Ref Range Status   C difficile by pcr NEGATIVE NEGATIVE Final    Radiology Reports No results found.  CBC  Recent Labs Lab 12/08/14 0455 12/11/14 0347  WBC 7.1 7.0  HGB 11.3* 11.6*  HCT 36.5* 38.0*  PLT 179 197  MCV 86.7 87.2  MCH 26.8  26.6  MCHC 31.0 30.5  RDW 17.1* 17.2*    Chemistries   Recent Labs Lab 12/09/14 0400 12/10/14 0315 12/11/14 0347 12/12/14 0400 12/13/14 0413  NA 137 137 135 136 137  K 4.3 4.5 5.1 4.9 4.5  CL 105 105 106 105 105  CO2 20 21 19  17* 22  GLUCOSE 121* 96 97 82 122*  BUN 65* 66* 68* 74* 74*  CREATININE 2.85* 2.74* 2.69* 2.68* 2.76*  CALCIUM 8.3* 8.7 8.7 8.7 8.5  MG 2.5  --   --   --   --     ------------------------------------------------------------------------------------------------------------------ estimated creatinine clearance is 34.8 mL/min (by C-G formula based on Cr of 2.76). ------------------------------------------------------------------------------------------------------------------ No results for input(s): HGBA1C in the last 72 hours. ------------------------------------------------------------------------------------------------------------------ No results for input(s): CHOL, HDL, LDLCALC, TRIG, CHOLHDL, LDLDIRECT in the last 72 hours. ------------------------------------------------------------------------------------------------------------------ No results for input(s): TSH, T4TOTAL, T3FREE, THYROIDAB in the last 72 hours.  Invalid input(s): FREET3 ------------------------------------------------------------------------------------------------------------------ No results for input(s): VITAMINB12, FOLATE, FERRITIN, TIBC, IRON, RETICCTPCT in the last 72 hours.  Recent Labs  12/12/14 1145 12/12/14 1855 12/12/14 2128 12/13/14 0736 12/13/14 1113 12/13/14 1631  GLUCAP 98 89 177* 103* 119* 81    Coagulation profile  Recent Labs Lab 12/12/14 0400  INR 1.50*    No results for input(s): DDIMER in the last 72 hours.  Cardiac Enzymes No results for input(s): CKMB, TROPONINI, MYOGLOBIN in the last 168 hours.  Invalid input(s): CK ------------------------------------------------------------------------------------------------------------------ Invalid input(s): POCBNP     Time Spent in minutes   25 minutes   Wasim Hurlbut L MD on 12/13/2014 at 5:43 PM  [email or web address] - password [facility]   [facility]

## 2014-12-13 NOTE — Progress Notes (Signed)
Patient ID: [name] [name], male   DOB: Dec 12, 1945, 69 y.o.   MRN: [id number]  [facility] Progress Note    Assessment/ Plan:   1. Acute renal failure on chronic kidney disease stage IV in a patient with a solitary kidney: This morning labs show no significant rise of creatinine noted following contrast exposure. Results of right heart catheterization reviewed-he is significantly volume overloaded and will require diuresis prior to attempts at aortic valve repair. We'll continue to monitor on diuretic therapy and consider dialysis if remains diuretic resistant. Would recommend starting furosemide later today (40-80 mg IV 3 times a day). 2. Acute exacerbation of congestive heart failure-history of severe aortic insufficiency with an ejection fraction of 40-45%-intermittent furosemide with symptomatic improvement- restart after cath. 3. Panhypopituitarism: Ongoing replacement therapy 4. Morbid obesity-possibly with an element of OSA 5. Anemia of chronic disease: Hemoglobin acceptable but he has evidence of iron deficiency-low iron saturation/ferritin. Getting intravenous iron  Subjective:   Underwent right heart catheterization/selective coronary angiography yesterday with 110 mL contrast volume used. Currently reports to be feeling fair-still having intermittent shortness of breath    Objective:   BP 138/51 mmHg  Pulse 76  Temp(Src) 98 F (36.7 C) (Oral)  Resp 19  Ht 6\' 1"  (1.854 m)  Wt 126.871 kg (279 lb 11.2 oz)  BMI 36.91 kg/m2  SpO2 99%  Intake/Output Summary (Last 24 hours) at 12/13/14 0901 Last data filed at 12/13/14 0700  Gross per 24 hour  Intake   1648 ml  Output   1025 ml  Net    623 ml   Weight change: 1.633 kg (3 lb 9.6 oz)  Physical Exam: Gen: Appears to be comfortable resting in bed-intermittently sleepy CVS: Pulse regular in rate and rhythm Resp: Decreased breath sounds over bases otherwise clear-no distinct rales Abd: Soft, obese, nontender Ext:  2-3+ lower extremity edema  Imaging: No results found.  Labs: BMET  Recent Labs Lab 12/07/14 0420 12/08/14 0455 12/09/14 0400 12/10/14 0315 12/11/14 0347 12/12/14 0400 12/13/14 0413  NA 134* 136 137 137 135 136 137  K 4.2 4.4 4.3 4.5 5.1 4.9 4.5  CL 103 104 105 105 106 105 105  CO2 23 13* 20 21 19  17* 22  GLUCOSE 95 103* 121* 96 97 82 122*  BUN 56* 60* 65* 66* 68* 74* 74*  CREATININE 3.04* 2.97* 2.85* 2.74* 2.69* 2.68* 2.76*  CALCIUM 8.2* 8.5 8.3* 8.7 8.7 8.7 8.5  PHOS  --  3.8 3.9 4.1 4.2 4.9* 4.7*   CBC  Recent Labs Lab 12/08/14 0455 12/11/14 0347  WBC 7.1 7.0  HGB 11.3* 11.6*  HCT 36.5* 38.0*  MCV 86.7 87.2  PLT 179 197    Medications:    . antiseptic oral rinse  7 mL Mouth Rinse BID  . aspirin EC  325 mg Oral Daily  . atorvastatin  40 mg Oral q1800  . budesonide-formoterol  2 puff Inhalation BID  . ciprofloxacin  250 mg Oral BID  . cloNIDine  0.2 mg Oral TID  . desmopressin  0.2 mg Oral BID  . dexamethasone  0.5 mg Oral Daily  . ferric gluconate (FERRLECIT/NULECIT) IV  250 mg Intravenous Q M,W,F  . heparin  5,000 Units Subcutaneous 3 times per day  . insulin aspart  0-9 Units Subcutaneous TID WC  . levothyroxine  175 mcg Oral QAC breakfast  . metroNIDAZOLE  500 mg Oral 3 times per day  . phenytoin  200 mg Oral Once per day on  Mon Thu  . phenytoin  300 mg Oral Once per day on Sun Tue Wed Fri Sat  . sodium bicarbonate  650 mg Oral BID  . sodium chloride  3 mL Intravenous Q12H  . sodium chloride  3 mL Intravenous Q12H  . sodium chloride  3 mL Intravenous Q12H  . tiotropium  18 mcg Inhalation Daily    Elmarie Shiley, MD 12/13/2014, 9:01 AM

## 2014-12-13 NOTE — Progress Notes (Addendum)
Subjective: [name] very sleepy  Sitting in chair  No CP  Breathing is fair   Objective: Filed Vitals:   12/12/14 2255 12/12/14 2325 12/12/14 2355 12/13/14 0500  BP: 134/54 118/55 129/48 138/51  Pulse: 78 75 79 76  Temp:    98 F (36.7 C)  TempSrc:    Oral  Resp: 19 23 21 19   Height:      Weight:    279 lb 11.2 oz (126.871 kg)  SpO2: 97% 100% 97% 99%   Weight change: 3 lb 9.6 oz (1.633 kg)  Intake/Output Summary (Last 24 hours) at 12/13/14 0804 Last data filed at 12/13/14 0700  Gross per 24 hour  Intake   1508 ml  Output   1025 ml  Net    483 ml    General: Sleepy  Awakens to questions.  Morbidly obese   Neck:  Unable to assess JVP  Neck full   Heart: Regular rate and rhythm  Decreased S2  Gr III/VI systolic murmur   II/VI diastolic murmur Lungs: CTA   Exemities:  1-2+ edema.  Chronic skin changes   Neuro: Grossly intact, nonfocal.  Tele:  SR  Lab Results: Results for orders placed or performed during the hospital encounter of 12/04/14 (from the past 24 hour(s))  Glucose, capillary     Status: None   Collection Time: 12/12/14 11:45 AM  Result Value Ref Range   Glucose-Capillary 98 70 - 99 mg/dL  I-STAT 3, venous blood gas (G3P V)     Status: Abnormal   Collection Time: 12/12/14  5:03 PM  Result Value Ref Range   pH, Ven 7.312 (H) 7.250 - 7.300   pCO2, Ven 38.3 (L) 45.0 - 50.0 mmHg   pO2, Ven 28.0 (LL) 30.0 - 45.0 mmHg   Bicarbonate 19.4 (L) 20.0 - 24.0 mEq/L   TCO2 21 0 - 100 mmol/L   O2 Saturation 48.0 %   Acid-base deficit 6.0 (H) 0.0 - 2.0 mmol/L   Sample type VENOUS   I-STAT 3, arterial blood gas (G3+)     Status: Abnormal   Collection Time: 12/12/14  5:48 PM  Result Value Ref Range   pH, Arterial 7.368 7.350 - 7.450   pCO2 arterial 31.1 (L) 35.0 - 45.0 mmHg   pO2, Arterial 114.0 (H) 80.0 - 100.0 mmHg   Bicarbonate 17.9 (L) 20.0 - 24.0 mEq/L   TCO2 19 0 - 100 mmol/L   O2 Saturation 98.0 %   Acid-base deficit 6.0 (H) 0.0 - 2.0 mmol/L   Sample type  ARTERIAL   Glucose, capillary     Status: None   Collection Time: 12/12/14  6:55 PM  Result Value Ref Range   Glucose-Capillary 89 70 - 99 mg/dL  Glucose, capillary     Status: Abnormal   Collection Time: 12/12/14  9:28 PM  Result Value Ref Range   Glucose-Capillary 177 (H) 70 - 99 mg/dL  Renal function panel     Status: Abnormal   Collection Time: 12/13/14  4:13 AM  Result Value Ref Range   Sodium 137 135 - 145 mmol/L   Potassium 4.5 3.5 - 5.1 mmol/L   Chloride 105 96 - 112 mEq/L   CO2 22 19 - 32 mmol/L   Glucose, Bld 122 (H) 70 - 99 mg/dL   BUN 74 (H) 6 - 23 mg/dL   Creatinine, Ser 2.76 (H) 0.50 - 1.35 mg/dL   Calcium 8.5 8.4 - 10.5 mg/dL   Phosphorus 4.7 (H) 2.3 - 4.6  mg/dL   Albumin 3.2 (L) 3.5 - 5.2 g/dL   GFR calc non Af Amer 22 (L) >90 mL/min   GFR calc Af Amer 25 (L) >90 mL/min   Anion gap 10 5 - 15  Glucose, capillary     Status: Abnormal   Collection Time: 12/13/14  7:36 AM  Result Value Ref Range   Glucose-Capillary 103 (H) 70 - 99 mg/dL    Studies/Results: No results found.  Medications: Reviewed   @PROBHOSP @  1.  Aortic insufficiency  [name] underwnt cath yesterday Right sided pressures were severely elevated severely elevated filling pressures    Procedural Findings from R/L heart cath:   RA mean 22 RV 84/21  PA 93/35, mean 53 PCWP mean PCWP 33 LV 117/40 AO 121/54 Cardiac Output (Fick) 4.16  Cardiac Index (Fick) 1.68 I am not sure of the accuracy of the Fick CO as the PA and aortic saturations were not measured simultaneously and the patient's oxygen saturation fluctuated significantly.   Coronary angiography: Coronary dominance: right  Left mainstem: 20% proximal LM stenosis.  Left anterior descending (LAD): Luminal irregularities.Left circumflex (LCx): Luminal irregularities.  Right coronary artery (RCA): Luminal irregularities.   [name] needs extensive diuresis.  Will discuss hemodialysis with renal service  As I do not think lasix  alone will be adequate.   I have reviewed with  [name] his primary cardiologist  Will ask surgery to cardiac surgery to see patient re valve replacement.    2.  CAD  Minimal  3.  CKD  Patient's Cr took bump today  Review with renal    I have reviewed with Dr Posey Pronto  He would recomm challenging patient with lasix 80 IV tid first  If not sufficient response would dialyze.     LOS: 9 days   [name] 12/13/2014, 8:04 AM

## 2014-12-13 NOTE — Progress Notes (Signed)
Pt states that he does not wear cpap at home and refused cpap  At this time.

## 2014-12-13 NOTE — Progress Notes (Signed)
CSW (Clinical Education officer, museum) notified that pt is not ready for dc today. CSW called [facility] and notified. They informed CSW they cannot guarantee pt will have a bed at their facility at dc but asked CSW keep them updated. Pt did receive [employer] authorization for SNF but will likely need a new authorization at discharge.  [place], [place]

## 2014-12-13 NOTE — Progress Notes (Signed)
Apnea when in laying position noted by IV team member, pt reports having sleep apnea and has CPAP at home, reports he does not use his CPAP, Dr Conley Canal notified.

## 2014-12-14 ENCOUNTER — Other Ambulatory Visit: Payer: Self-pay | Admitting: *Deleted

## 2014-12-14 DIAGNOSIS — I351 Nonrheumatic aortic (valve) insufficiency: Secondary | ICD-10-CM

## 2014-12-14 LAB — RENAL FUNCTION PANEL
ALBUMIN: 3.3 g/dL — AB (ref 3.5–5.2)
ANION GAP: 10 (ref 5–15)
BUN: 73 mg/dL — ABNORMAL HIGH (ref 6–23)
CALCIUM: 8.5 mg/dL (ref 8.4–10.5)
CO2: 21 mmol/L (ref 19–32)
Chloride: 105 mEq/L (ref 96–112)
Creatinine, Ser: 2.79 mg/dL — ABNORMAL HIGH (ref 0.50–1.35)
GFR calc non Af Amer: 21 mL/min — ABNORMAL LOW (ref >90)
GFR, EST AFRICAN AMERICAN: 25 mL/min — AB (ref >90)
Glucose, Bld: 100 mg/dL — ABNORMAL HIGH (ref 70–99)
POTASSIUM: 4.7 mmol/L (ref 3.5–5.1)
Phosphorus: 4.1 mg/dL (ref 2.3–4.6)
SODIUM: 136 mmol/L (ref 135–145)

## 2014-12-14 LAB — GLUCOSE, CAPILLARY
GLUCOSE-CAPILLARY: 148 mg/dL — AB (ref 70–99)
GLUCOSE-CAPILLARY: 107 mg/dL — AB (ref 70–99)
Glucose-Capillary: 95 mg/dL (ref 70–99)
Glucose-Capillary: 88 mg/dL (ref 70–99)
Glucose-Capillary: 111 mg/dL — ABNORMAL HIGH (ref 70–99)

## 2014-12-14 NOTE — Progress Notes (Signed)
CARDIAC REHAB PHASE I   PRE:  Rate/Rhythm: 77 SR    BP: sitting 118/43    SaO2: 100 2L  MODE:  Ambulation: 80 ft   POST:  Rate/Rhythm: 82 SR    BP: sitting 132/47     SaO2: 96 2L  Pt stood independently. Used RW and 2L O2. Leans over RW with wide stance. SOB with distance. SaO2 95 2L in hall and after walk. Return to recliner with feet elevated. [phone]   [name] [name] [place] CES, ACSM 12/14/2014 3:21 PM

## 2014-12-14 NOTE — Progress Notes (Signed)
Pt refuses to wear CPAP. Pt says that he knows he needs to but does not want to. He understands to call us if he changes his mind. RT will monitor.

## 2014-12-14 NOTE — Progress Notes (Signed)
SATURATION QUALIFICATIONS: (This note is used to comply with regulatory documentation for home oxygen)  Patient Saturations on Room Air at Rest = 84-88%  Patient Saturations on Room Air while Ambulating = 78-84%  Patient Saturations on 3-4 Liters of oxygen while Ambulating = 88-93%  Please briefly explain why patient needs home oxygen:Pt desats on RA at rest and with ambulation.  Pt keeps O2 >88% with O2 with ambulation.  Thanks. [place] [phone] (pager)

## 2014-12-14 NOTE — Progress Notes (Signed)
Physical Therapy Treatment Patient Details Name: [name] [name] MRN: [id number] DOB: 01/30/1946 Today's Date: 12/14/2014    History of Present Illness Patient is a 69 yo male admitted 12/04/14 with CP, SOB, fever, RUQ pain.  Patient with sepsis, CHF, CKD, cholecystitis.  PMH:  CKD, HTN, DM, severe aortic insufficiency, declining LV function, obesity, OSA.    PT Comments    Pt admitted with above diagnosis. Pt currently with functional limitations due to the deficits listed below (see PT Problem List). Pt ambulating well with RW but continues with poor endurance with desaturations in low 80's without O2.  Continue to recommend short term NHP for pt when medically ready.   Pt will benefit from skilled PT to increase their independence and safety with mobility to allow discharge to the venue listed below.    Follow Up Recommendations  SNF;Supervision/Assistance - 24 hour     Equipment Recommendations  3in1 (PT)    Recommendations for Other Services       Precautions / Restrictions Precautions Precautions: Fall Restrictions Weight Bearing Restrictions: No    Mobility  Bed Mobility               General bed mobility comments: Pt sitting in recliner at start of session; did not assess  Transfers Overall transfer level: Needs assistance Equipment used: Rolling walker (2 wheeled) Transfers: Sit to/from Stand Sit to Stand: Supervision         General transfer comment: Pt able to safely stand from recliner x1 with supervision for safety. Pt did not require any cuing.   Ambulation/Gait Ambulation/Gait assistance: Supervision Ambulation Distance (Feet): 165 Feet Assistive device: Rolling walker (2 wheeled) Gait Pattern/deviations: Step-through pattern;Decreased stride length;Trunk flexed;Shuffle   Gait velocity interpretation: Below normal speed for age/gender General Gait Details: Pt able to safely ambulate with RW without instability or loss of balance. Pt ambulating on  supplemental oxygen (see Qualification note).    Stairs            Wheelchair Mobility    Modified Rankin (Stroke Patients Only)       Balance Overall balance assessment: Needs assistance         Standing balance support: Bilateral upper extremity supported;During functional activity Standing balance-Leahy Scale: Fair Standing balance comment: Pt maintains static standing balance without support but requires RW for dynamic standing balance.                     Cognition Arousal/Alertness: Awake/alert Behavior During Therapy: Flat affect Overall Cognitive Status: Within Functional Limits for tasks assessed                      Exercises General Exercises - Upper Extremity Shoulder Flexion: AROM;Both;10 reps;Seated General Exercises - Lower Extremity Ankle Circles/Pumps: AROM;Both;15 reps;Seated Long Arc Quad: AROM;Both;10 reps;Seated Hip Flexion/Marching: AROM;Both;10 reps;Seated    General Comments        Pertinent Vitals/Pain Pain Assessment: Faces Faces Pain Scale: Hurts a little bit Pain Location: right wrist Pain Descriptors / Indicators: Sore Pain Intervention(s): Monitored during session    SATURATION QUALIFICATIONS: (This note is used to comply with regulatory documentation for home oxygen)  Patient Saturations on Room Air at Rest = 84-88%  Patient Saturations on Room Air while Ambulating = 78-84%  Patient Saturations on 3-4 Liters of oxygen while Ambulating = 88-93%  Please briefly explain why patient needs home oxygen:Pt desats on RA at rest and with ambulation.  Pt keeps O2 >88% with O2  with ambulation.  Home Living                      Prior Function            PT Goals (current goals can now be found in the care plan section) Progress towards PT goals: Progressing toward goals    Frequency  Min 3X/week    PT Plan Current plan remains appropriate    Co-evaluation             End of Session  Equipment Utilized During Treatment: Gait belt;Oxygen Activity Tolerance: Patient tolerated treatment well Patient left: in chair;with call bell/phone within reach     Time: 0935-0959 PT Time Calculation (min) (ACUTE ONLY): 24 min  Charges:  $Gait Training: 8-22 mins $Therapeutic Exercise: 8-22 mins                    G CodesIrwin Brakeman F 01/04/2015, 10:46 AM [facility] [phone] (pager)

## 2014-12-14 NOTE — Consult Note (Signed)
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                Value     Reference LA ID, A-P, ES              44  mm    --------- LA ID/bsa, A-P              1.69 cm/m^2  <=  2.2 LA volume, S               152  ml    --------- LA volume/bsa, S             58.4 ml/m^2  --------- LA volume, ES, 1-p A4C          136  ml    --------- LA volume/bsa, ES, 1-p A4C        52.2 ml/m^2  --------- LA volume, ES, 1-p A2C          149  ml    --------- LA volume/bsa, ES, 1-p A2C        57.2 ml/m^2  ---------  Mitral valve               Value     Reference Mitral E-wave peak velocity        145  cm/s   --------- Mitral mean velocity, D          90.9 cm/s   --------- Mitral deceleration time         201  ms    150 - 230 Mitral pressure half-time         88  ms    --------- Mitral mean gradient, D          4   mm Hg  --------- Mitral peak gradient, D          8   mm Hg  --------- Mitral valve area, PHT, DP        2.5  cm^2   --------- Mitral valve area/bsa, PHT, DP      0.96 cm^2/m^2 --------- Mitral valve area, LVOT          1.92 cm^2   --------- continuity Mitral valve area/bsa, LVOT        0.74 cm^2/m^2 --------- continuity Mitral annulus VTI, D           39.5 cm    ---------  Pulmonary arteries            Value     Reference PA pressure, S, DP        (H)   60  mm Hg  <=30  Tricuspid valve              Value     Reference Tricuspid regurg peak velocity      362  cm/s   --------- Tricuspid peak RV-RA gradient       52  mm Hg  ---------  Systemic  veins              Value     Reference Estimated CVP               8   mm Hg  ---------  Right ventricle              Value     Reference RV pressure, S, DP        (H)   60  mm Hg  <=30 RV s&', lateral, S             10.2 cm/s   ---------  Legend: (L) and (H) mark values outside specified reference range.  ------------------------------------------------------------------- Lindell Noe, M.D. 2015-12-11T07:59:42    Cardiac Catheterization Procedure Note  Name: AUGUST LONGEST MRN: [id number] DOB: 1946-01-15  Procedure: Right Heart Cath, Left Heart Cath, Selective Coronary Angiography  Indication: Severe AI, possible pre-AVR  Procedural Details: The right radial and  brachial area were prepped, draped, and anesthetized with 1% lidocaine. 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 This was replaced with a 5 French venous sheath. A Swan-Ganz catheter was used for the right heart catheterization. Standard protocol was followed for recording of right heart pressures and sampling of oxygen saturations. Fick cardiac output was calculated. The right radial artery was entered using modified Seldinger technique and a 6 French arterial sheath was placed. Coronary arteries were difficult to engage due to aortic root and ascending aorta aneurysm. The RCA was engaged with a long JR4 catheter. The LCA was engaged with a JL5 guide. There were no immediate procedural complications. The patient was transferred to the post catheterization recovery area for further monitoring.  Procedural Findings: Hemodynamics (mmHg) RA mean 22 RV 84/21 PA 93/35, mean 53 PCWP mean PCWP 33 LV 117/40 AO 121/54  Oxygen saturations: PA 48% AO 98%  Cardiac Output (Fick) 4.16  Cardiac Index (Fick) 1.68 I am not sure of the accuracy of the Fick CO as the PA and aortic saturations were  not measured simultaneously and the patient's oxygen saturation fluctuated significantly.   Coronary angiography: Coronary dominance: right  Left mainstem: 20% proximal LM stenosis.   Left anterior descending (LAD): Luminal irregularities.  Left circumflex (LCx): Luminal irregularities.   Right coronary artery (RCA): Luminal irregularities.   Left ventriculography: Not done, CKD.  Contrast: 110 cc  Final Conclusions: Severely elevated right and left heart filling pressures. Wide aortic pulse pressure suggestive of significant aortic insufficiency. I am not sure of the accuracy of the Fick CO as the PA and aortic saturations were not measured simultaneously and the patient's oxygen saturation fluctuated significantly. 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 No significant coronary disease.   Recommendations: I am not giving him any post-cath fluid with elevated filling pressures. If creatinine stable, restart aggressive diuresis in the morning. If creatinine rises, will need dialysis for volume removal. Needs lower filling pressures prior to any consideration for valve surgery.   Loralie Champagne 12/12/2014, 6:04 PM       STS Risk Calculator  Procedure: AV Replacement    Risk of Mortality: 17.86%  Morbidity or Mortality: 65.59%  Long Length of Stay: 54.51%  Short Length of Stay: 3.138%  Permanent Stroke: 1.871%  Prolonged Ventilation: 67.067%  DSW Infection: 4.309%  Renal Failure: N/A  Reoperation: 24.35%    Assessment/Plan:  I have personally reviewed his echocardiograms and cardiac cath, interviewed and examined the patient. 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 He has mild enlargement of  the aortic root and ascending aorta by echo but a CT has not been done due to his renal failure. Aortic valve replacement is the only treatment option available for this gentleman other than palliative medical care. His operative risk is very high as noted above in the STS risk estimator. I will check PFT's to determine if he is even a candidate. His right and left heart pressures were very high at cath and he would have to be tuned up maximally to even have a chance. He would certainly end up on dialysis postop. I discussed the operative procedure with the patient  including alternatives, benefits and risks; including but not limited to bleeding,  blood transfusion, infection, stroke, myocardial infarction, graft failure, heart block requiring a permanent pacemaker, organ dysfunction, and death.  Wayna Chalet understands and would like to proceed with surgery if we feel that he is a candidate. I will need to see his PFT's first.  I spent 80 minutes performing this consultation and > 50% of this time was spent face to face counseling and coordinating the care of this patient's severe aortic regurgitation with acute on chronic mixed congestiive heart failure.  Gaye Pollack 12/14/2014, 4:23 PM

## 2014-12-14 NOTE — Progress Notes (Signed)
UR Completed Nadya Hopwood Graves-Bigelow, RN,BSN [phone]  

## 2014-12-14 NOTE — Progress Notes (Signed)
   Subjective: Patient very sleepy  In chair. Objective: Filed Vitals:   12/13/14 2020 12/13/14 2225 12/14/14 0500 12/14/14 0917  BP: 138/42 124/48 124/47   Pulse: 77 79 78   Temp: 98.3 F (36.8 C)  97.9 F (36.6 C)   TempSrc: Oral  Oral   Resp: 18  20   Height:      Weight:   282 lb 4.8 oz (128.05 kg)   SpO2: 100%  100% 99%   Weight change: 2 lb 9.6 oz (1.179 kg)  Intake/Output Summary (Last 24 hours) at 12/14/14 0931 Last data filed at 12/14/14 0855  Gross per 24 hour  Intake   1080 ml  Output   2275 ml  Net  -1195 ml   Net Neg  5.5 L neg  General: Alert, awake, oriented x3, in no acute distress Neck:  JVP is normal Heart: Regular rate and rhythm, without murmurs, rubs, gallops.  Lungs: Clear to auscultation.  No rales or wheezes. Exemities:  No edema.   Neuro: Grossly intact, nonfocal.  Tele:  SR    Lab Results: Results for orders placed or performed during the hospital encounter of 12/04/14 (from the past 24 hour(s))  Glucose, capillary     Status: Abnormal   Collection Time: 12/13/14 11:13 AM  Result Value Ref Range   Glucose-Capillary 119 (H) 70 - 99 mg/dL  Glucose, capillary     Status: None   Collection Time: 12/13/14  4:31 PM  Result Value Ref Range   Glucose-Capillary 81 70 - 99 mg/dL  Glucose, capillary     Status: None   Collection Time: 12/14/14  1:26 AM  Result Value Ref Range   Glucose-Capillary 95 70 - 99 mg/dL  Renal function panel     Status: Abnormal   Collection Time: 12/14/14  6:30 AM  Result Value Ref Range   Sodium 136 135 - 145 mmol/L   Potassium 4.7 3.5 - 5.1 mmol/L   Chloride 105 96 - 112 mEq/L   CO2 21 19 - 32 mmol/L   Glucose, Bld 100 (H) 70 - 99 mg/dL   BUN 73 (H) 6 - 23 mg/dL   Creatinine, Ser 2.79 (H) 0.50 - 1.35 mg/dL   Calcium 8.5 8.4 - 10.5 mg/dL   Phosphorus 4.1 2.3 - 4.6 mg/dL   Albumin 3.3 (L) 3.5 - 5.2 g/dL   GFR calc non Af Amer 21 (L) >90 mL/min   GFR calc Af Amer 25 (L) >90 mL/min   Anion gap 10 5 - 15    Glucose, capillary     Status: Abnormal   Collection Time: 12/14/14  7:25 AM  Result Value Ref Range   Glucose-Capillary 148 (H) 70 - 99 mg/dL   Comment 1 Documented in Chart    Comment 2 Notify RN     Studies/Results: No results found.  Medications: Reviewed   @PROBHOSP @  1  Aortic insufficiency.  Hemodynamics from cath show severe insufficiency  LVEF was 41.  [name] is diuresing with lasix for now  Will need to follow Cr    Surgery to see  2  CAD  Minimal  3  CKD  Renal following      LOS: 10 days   [name] 12/14/2014, 9:31 AM

## 2014-12-14 NOTE — Progress Notes (Signed)
Patient Demographics  [name] [name], is a 69 y.o. male, DOB - 06-14-1946, KYH:[id number]  Admit date - 12/04/2014   Admitting Physician Troy Sine, MD  Outpatient Primary MD for the patient is [name], MD  LOS - 10   Chief Complaint  Patient presents with  . Shortness of Breath      Admission history of present illness/brief narrative: 69 year-old gentleman presenting to the Emergency Dept today with multiple complaints. These include chest pain, shortness of breath, and RUQ pain. He has been previously scheduled for elective cardiac catheterization 12/29 to evaluate severe aortic insufficiency and declining LV function. However, the patient came to the emergency department today because he has noticed an acute change in his symptoms over the last 24 hours. He complains of aching all over. He also has had some nausea and vomiting. He complains of pain in his upper abdominal area. He has also had chest pain. His breathing has been worse over the last 2-3 days. He does feel that his "retaining more fluid." He admits to dietary indiscretion with salt. He has been taking his medications. Right upper quadrant ultrasound was done and there concern about acalculous cholecystitis general surgery recommended HIDA scan which was negative.    Assessment & Plan    Principal Problem:   Sepsis Active Problems:   Obese   Hypertension   Sleep apnea   Hypothyroid   Panhypopituitarism   Moderate aortic regurgitation   Diabetes   CKD (chronic kidney disease), stage IV   Acalculous cholecystitis   Abdominal pain   Acute on chronic combined systolic and diastolic CHF (congestive heart failure)   SOB (shortness of breath)   Fever   Aortic insufficiency   Chest pain: Cath without significant CAD. Severely elevated right and left heart filling pressures. 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  Abdominal pain, nausea, vomiting, HIDA negative.   Fever -BC x2 NGTD -vanc/zosyn- transition over to PO cipro/flagyl for a 10 days course. Now completed -CT scan abd show inflammation- tender in RUQ is gone after ABX -outpatient surgery follow up  Acute on chronic systolic/diastolic heart failure,  -LVEF 40-45 percent with diffuse hypokinesis, diastolic dysfunction, moderate to severe aortic regurgitation, severe pulmonary hypertension. Massively fluid overloaded. Good uop over night on lasix 120 tid  Panhypopituitarism desmopressin, Decadron, Synthroid.  Seizure disorder phenytoin. Level ok on admission  Diabetes mellitus controlled  COPD -No active wheezing, on 2 L at baseline, appears stable  chronic renal failure-stage IV May require dialysis soon.  Obstructive sleep apnea: Noncompliant with CPAP  Code Status: Full  Family Communication:   Disposition Plan: Remains on telemetry   Procedures  Cardiac cath  Consults   Cardiology nephrology Surgery TCTS  Medications  Scheduled Meds: . antiseptic oral rinse  7 mL Mouth Rinse BID  . aspirin EC  325 mg Oral Daily  . atorvastatin  40 mg Oral q1800  . budesonide-formoterol  2 puff Inhalation BID  . cloNIDine  0.2 mg Oral TID  . desmopressin  0.2 mg Oral BID  . dexamethasone  0.5 mg Oral Daily  . ferric gluconate (FERRLECIT/NULECIT) IV  250 mg Intravenous Q M,W,F  . furosemide  120 mg  Intravenous TID  . heparin  5,000 Units Subcutaneous 3 times per day  . insulin aspart  0-9 Units Subcutaneous TID WC  . levothyroxine  175 mcg Oral QAC breakfast  . phenytoin  200 mg Oral Once per day on Mon Thu  . phenytoin  300 mg Oral Once per day on Sun Tue Wed Fri Sat  . sodium bicarbonate  650 mg Oral BID  . sodium chloride  3 mL Intravenous Q12H  . sodium chloride  3 mL Intravenous Q12H  . sodium chloride  3 mL Intravenous Q12H  . tiotropium  18 mcg Inhalation Daily    Continuous Infusions:   PRN Meds:.sodium chloride, sodium chloride, acetaminophen, acetaminophen, albuterol, diphenhydrAMINE, magic mouthwash, ondansetron (ZOFRAN) IV, ondansetron, sodium chloride, sodium chloride, sodium chloride  DVT Prophylaxis   Heparin -  Lab Results  Component Value Date   PLT 197 12/11/2014    Antibiotics    Anti-infectives    Start     Dose/Rate Route Frequency Ordered Stop   12/11/14 2200  ciprofloxacin (CIPRO) tablet 250 mg  Status:  Discontinued     250 mg Oral 2 times daily 12/11/14 2010 12/13/14 2111   12/11/14 2200  metroNIDAZOLE (FLAGYL) tablet 500 mg  Status:  Discontinued     500 mg Oral 3 times per day 12/11/14 2010 12/13/14 2111   12/09/14 1400  metroNIDAZOLE (FLAGYL) tablet 500 mg  Status:  Discontinued     500 mg Oral 3 times per day 12/09/14 1111 12/11/14 1509   12/09/14 1200  amoxicillin-clavulanate (AUGMENTIN) 875-125 MG per tablet 1 tablet  Status:  Discontinued     1 tablet Oral Every 12 hours 12/09/14 1111 12/09/14 1124   12/09/14 1200  ciprofloxacin (CIPRO) tablet 500 mg  Status:  Discontinued     500 mg Oral 2 times daily 12/09/14 1124 12/11/14 1509   12/08/14 0900  vancomycin (VANCOCIN) 1,750 mg in sodium chloride 0.9 % 500 mL IVPB  Status:  Discontinued     1,750 mg250 mL/hr over 120 Minutes Intravenous Every 48 hours 12/06/14 0753 12/09/14 1111   12/06/14 1500  piperacillin-tazobactam (ZOSYN) IVPB 3.375 g  Status:  Discontinued     3.375 g12.5 mL/hr over 240 Minutes Intravenous Every 8 hours 12/06/14 0748 12/09/14 1111   12/06/14 0800  piperacillin-tazobactam (ZOSYN) IVPB 3.375 g     3.375 g100 mL/hr over 30 Minutes Intravenous  Once 12/06/14 0747 12/06/14 0921   12/06/14 0800  vancomycin (VANCOCIN) 2,000 mg in sodium chloride 0.9 % 500 mL IVPB     2,000 mg250 mL/hr over 120 Minutes Intravenous  Once 12/06/14 0747 12/06/14 1211   12/06/14 0700  cefTRIAXone (ROCEPHIN) 1 g in dextrose 5 % 50 mL IVPB - Premix  Status:  Discontinued      1 g100 mL/hr over 30 Minutes Intravenous  Once 12/06/14 0658 12/06/14 0748   12/04/14 1830  cefTRIAXone (ROCEPHIN) 1 g in dextrose 5 % 50 mL IVPB - Premix  Status:  Discontinued     1 g100 mL/hr over 30 Minutes Intravenous Every 24 hours 12/04/14 1727 12/05/14 1717      Subjective: Feels slightly less short of breath.    Objective:   Filed Vitals:   12/13/14 2225 12/14/14 0500 12/14/14 0917 12/14/14 1530  BP: 124/48 124/47  129/57  Pulse: 79 78  77  Temp:  97.9 F (36.6 C)  98.4 F (36.9 C)  TempSrc:  Oral  Axillary  Resp:  20  20  Height:  Weight:  128.05 kg (282 lb 4.8 oz)    SpO2:  100% 99% 100%    Wt Readings from Last 3 Encounters:  12/14/14 128.05 kg (282 lb 4.8 oz)  11/16/14 127.007 kg (280 lb)  06/12/14 121.201 kg (267 lb 3.2 oz)     Intake/Output Summary (Last 24 hours) at 12/14/14 1822 Last data filed at 12/14/14 0855  Gross per 24 hour  Intake    840 ml  Output   1575 ml  Net   -735 ml     Physical Exam  Awake in chair sleepy. Answers questions and follows commands. Symmetrical Chest wall movement, Good air movement bilaterally, CTAB RRR,No Gallops,Rubs or new Murmurs, No Parasternal Heave +ve B.Sounds, obese +2edema   Data Review   Micro Results Recent Results (from the past 240 hour(s))  Culture, blood (routine x 2)     Status: None   Collection Time: 12/06/14  8:45 AM  Result Value Ref Range Status   Specimen Description BLOOD LEFT ARM  Final   Special Requests BOTTLES DRAWN AEROBIC AND ANAEROBIC 10CC  Final   Culture   Final    NO GROWTH 5 DAYS Note: Culture results may be compromised due to an inadequate volume of blood received in culture bottles. Performed at [facility]    Report Status 12/12/2014 FINAL  Final  Culture, blood (routine x 2)     Status: None   Collection Time: 12/06/14  8:50 AM  Result Value Ref Range Status   Specimen Description BLOOD RIGHT HAND  Final   Special Requests BOTTLES DRAWN AEROBIC ONLY  10CC  Final   Culture   Final    NO GROWTH 5 DAYS Note: Culture results may be compromised due to an excessive volume of blood received in culture bottles. Performed at [facility]    Report Status 12/12/2014 FINAL  Final  Urine culture     Status: None   Collection Time: 12/06/14 10:30 PM  Result Value Ref Range Status   Specimen Description URINE, CLEAN CATCH  Final   Special Requests Normal  Final   Colony Count NO GROWTH Performed at [facility]   Final   Culture NO GROWTH Performed at [facility]   Final   Report Status 12/10/2014 FINAL  Final  Clostridium Difficile by PCR     Status: None   Collection Time: 12/06/14 10:42 PM  Result Value Ref Range Status   C difficile by pcr NEGATIVE NEGATIVE Final  Clostridium Difficile by PCR     Status: None   Collection Time: 12/10/14  1:19 AM  Result Value Ref Range Status   C difficile by pcr NEGATIVE NEGATIVE Final    Radiology Reports No results found.  CBC  Recent Labs Lab 12/08/14 0455 12/11/14 0347  WBC 7.1 7.0  HGB 11.3* 11.6*  HCT 36.5* 38.0*  PLT 179 197  MCV 86.7 87.2  MCH 26.8 26.6  MCHC 31.0 30.5  RDW 17.1* 17.2*    Chemistries   Recent Labs Lab 12/09/14 0400 12/10/14 0315 12/11/14 0347 12/12/14 0400 12/13/14 0413 12/14/14 0630  NA 137 137 135 136 137 136  K 4.3 4.5 5.1 4.9 4.5 4.7  CL 105 105 106 105 105 105  CO2 20 21 19  17* 22 21  GLUCOSE 121* 96 97 82 122* 100*  BUN 65* 66* 68* 74* 74* 73*  CREATININE 2.85* 2.74* 2.69* 2.68* 2.76* 2.79*  CALCIUM 8.3* 8.7 8.7 8.7 8.5 8.5  MG  2.5  --   --   --   --   --    ------------------------------------------------------------------------------------------------------------------ estimated creatinine clearance is 34.6 mL/min (by C-G formula based on Cr of 2.79). ------------------------------------------------------------------------------------------------------------------ No results for input(s): HGBA1C in the last 72  hours. ------------------------------------------------------------------------------------------------------------------ No results for input(s): CHOL, HDL, LDLCALC, TRIG, CHOLHDL, LDLDIRECT in the last 72 hours. ------------------------------------------------------------------------------------------------------------------ No results for input(s): TSH, T4TOTAL, T3FREE, THYROIDAB in the last 72 hours.  Invalid input(s): FREET3 ------------------------------------------------------------------------------------------------------------------ No results for input(s): VITAMINB12, FOLATE, FERRITIN, TIBC, IRON, RETICCTPCT in the last 72 hours.  Recent Labs  12/13/14 1113 12/13/14 1631 12/14/14 0126 12/14/14 0725 12/14/14 1146 12/14/14 1656  GLUCAP 119* 81 95 148* 107* 88    Coagulation profile  Recent Labs Lab 12/12/14 0400  INR 1.50*    No results for input(s): DDIMER in the last 72 hours.  Cardiac Enzymes No results for input(s): CKMB, TROPONINI, MYOGLOBIN in the last 168 hours.  Invalid input(s): CK ------------------------------------------------------------------------------------------------------------------ Invalid input(s): POCBNP     Time Spent in minutes   15 minutes   Orra Nolde L MD on 12/14/2014 at 6:22 PM  [email or web address] - password [facility]   [facility]

## 2014-12-14 NOTE — Progress Notes (Signed)
Patient ID: [name] [name], male   DOB: [birth year], 69 y.o.   MRN: [id number]  [facility] Progress Note    Assessment/ Plan:   1. Acute renal failure on chronic kidney disease stage IV in a patient with a solitary kidney: Appears to have stable renal function after contrast exposure-good diuretic response overnight on furosemide 120 mg 3 times a day, continue to monitor for optimization of volume status gearing towards aortic valve replacement. 2. Acute exacerbation of congestive heart failure-history of severe aortic insufficiency with an ejection fraction of 40-45%-ongoing diuretic therapy and awaiting surgical evaluation for possible management options. Right heart catheterization showed moderate to severe aortic regurgitation and severe pulmonary hypertension/volume overload 3. Panhypopituitarism: Ongoing replacement therapy 4. Morbid obesity-possibly with an element of OSA 5. Anemia of chronic disease: Hemoglobin acceptable but he has evidence of iron deficiency-low iron saturation/ferritin. Getting intravenous iron Subjective:   Reports to be feeling fair-reports a comfortable night. Refuses CPAP    Objective:   BP 124/47 mmHg  Pulse 78  Temp(Src) 97.9 F (36.6 C) (Oral)  Resp 20  Ht 6\' 1"  (1.854 m)  Wt 128.05 kg (282 lb 4.8 oz)  BMI 37.25 kg/m2  SpO2 99%  Intake/Output Summary (Last 24 hours) at 12/14/14 1108 Last data filed at 12/14/14 0855  Gross per 24 hour  Intake   1080 ml  Output   1875 ml  Net   -795 ml   Weight change: 1.179 kg (2 lb 9.6 oz)  Physical Exam: Gen: Sitting comfortably in recliner-intermittently sleepy CVS: Pulse regular in rate and rhythm Resp: Clear to auscultation-poor inspiratory effort Abd: Soft, obese/distended, no tenderness Ext: 3+ lower extremity edema  Imaging: No results found.  Labs: BMET  Recent Labs Lab 12/08/14 0455 12/09/14 0400 12/10/14 0315 12/11/14 0347 12/12/14 0400 12/13/14 0413 12/14/14 0630  NA  136 137 137 135 136 137 136  K 4.4 4.3 4.5 5.1 4.9 4.5 4.7  CL 104 105 105 106 105 105 105  CO2 13* 20 21 19  17* 22 21  GLUCOSE 103* 121* 96 97 82 122* 100*  BUN 60* 65* 66* 68* 74* 74* 73*  CREATININE 2.97* 2.85* 2.74* 2.69* 2.68* 2.76* 2.79*  CALCIUM 8.5 8.3* 8.7 8.7 8.7 8.5 8.5  PHOS 3.8 3.9 4.1 4.2 4.9* 4.7* 4.1   CBC  Recent Labs Lab 12/08/14 0455 12/11/14 0347  WBC 7.1 7.0  HGB 11.3* 11.6*  HCT 36.5* 38.0*  MCV 86.7 87.2  PLT 179 197    Medications:    . antiseptic oral rinse  7 mL Mouth Rinse BID  . aspirin EC  325 mg Oral Daily  . atorvastatin  40 mg Oral q1800  . budesonide-formoterol  2 puff Inhalation BID  . cloNIDine  0.2 mg Oral TID  . desmopressin  0.2 mg Oral BID  . dexamethasone  0.5 mg Oral Daily  . ferric gluconate (FERRLECIT/NULECIT) IV  250 mg Intravenous Q M,W,F  . furosemide  120 mg Intravenous TID  . heparin  5,000 Units Subcutaneous 3 times per day  . insulin aspart  0-9 Units Subcutaneous TID WC  . levothyroxine  175 mcg Oral QAC breakfast  . phenytoin  200 mg Oral Once per day on Mon Thu  . phenytoin  300 mg Oral Once per day on Sun Tue Wed Fri Sat  . sodium bicarbonate  650 mg Oral BID  . sodium chloride  3 mL Intravenous Q12H  . sodium chloride  3 mL Intravenous Q12H  .  sodium chloride  3 mL Intravenous Q12H  . tiotropium  18 mcg Inhalation Daily   Elmarie Shiley, MD 12/14/2014, 11:08 AM

## 2014-12-15 ENCOUNTER — Inpatient Hospital Stay (HOSPITAL_COMMUNITY): Payer: Medicare HMO

## 2014-12-15 LAB — PULMONARY FUNCTION TEST
FEF 25-75 POST: 1.29 L/s
FEF 25-75 Pre: 1.11 L/sec
FEF2575-%Change-Post: 15 %
FEF2575-%Pred-Post: 49 %
FEF2575-%Pred-Pre: 43 %
FEV1-%CHANGE-POST: 2 %
FEV1-%PRED-PRE: 41 %
FEV1-%Pred-Post: 43 %
FEV1-Post: 1.3 L
FEV1-Pre: 1.26 L
FEV1FVC-%CHANGE-POST: -1 %
FEV1FVC-%Pred-Pre: 103 %
FEV6-%Change-Post: 9 %
FEV6-%Pred-Post: 43 %
FEV6-%Pred-Pre: 40 %
FEV6-POST: 1.68 L
FEV6-Pre: 1.53 L
FEV6FVC-%Change-Post: 5 %
FEV6FVC-%PRED-PRE: 100 %
FEV6FVC-%Pred-Post: 105 %
FVC-%Change-Post: 4 %
FVC-%Pred-Post: 42 %
FVC-%Pred-Pre: 40 %
FVC-PRE: 1.61 L
FVC-Post: 1.68 L
PRE FEV1/FVC RATIO: 79 %
PRE FEV6/FVC RATIO: 95 %
Post FEV1/FVC ratio: 78 %
Post FEV6/FVC ratio: 100 %
RV % pred: 71 %
RV: 1.79 L
TLC % PRED: 53 %
TLC: 3.87 L

## 2014-12-15 LAB — RENAL FUNCTION PANEL
Albumin: 3.5 g/dL (ref 3.5–5.2)
Anion gap: 9 (ref 5–15)
BUN: 68 mg/dL — AB (ref 6–23)
CALCIUM: 8.6 mg/dL (ref 8.4–10.5)
CHLORIDE: 106 meq/L (ref 96–112)
CO2: 22 mmol/L (ref 19–32)
CREATININE: 2.74 mg/dL — AB (ref 0.50–1.35)
GFR calc Af Amer: 25 mL/min — ABNORMAL LOW (ref >90)
GFR calc non Af Amer: 22 mL/min — ABNORMAL LOW (ref >90)
Glucose, Bld: 95 mg/dL (ref 70–99)
PHOSPHORUS: 4.1 mg/dL (ref 2.3–4.6)
POTASSIUM: 4.8 mmol/L (ref 3.5–5.1)
SODIUM: 137 mmol/L (ref 135–145)

## 2014-12-15 LAB — GLUCOSE, CAPILLARY
GLUCOSE-CAPILLARY: 95 mg/dL (ref 70–99)
Glucose-Capillary: 89 mg/dL (ref 70–99)
Glucose-Capillary: 101 mg/dL — ABNORMAL HIGH (ref 70–99)
Glucose-Capillary: 90 mg/dL (ref 70–99)

## 2014-12-15 MED ORDER — ALBUTEROL SULFATE (2.5 MG/3ML) 0.083% IN NEBU
2.5000 mg | INHALATION_SOLUTION | Freq: Once | RESPIRATORY_TRACT | Status: AC
  Administered 2014-12-15: 2.5 mg via RESPIRATORY_TRACT

## 2014-12-15 NOTE — Progress Notes (Signed)
   Subjective: More awake today  No CP  Breathing rel stable   Objective: Filed Vitals:   12/14/14 2117 12/15/14 0500 12/15/14 0650 12/15/14 0911  BP: 124/47 156/45    Pulse: 80 79    Temp: 97.4 F (36.3 C) 97.3 F (36.3 C)    TempSrc: Oral Oral    Resp: 20 20    Height:      Weight:   276 lb 8 oz (125.42 kg)   SpO2: 97% 97%  96%   Weight change: -5 lb 12.8 oz (-2.631 kg)  Intake/Output Summary (Last 24 hours) at 12/15/14 1015 Last data filed at 12/15/14 4268  Gross per 24 hour  Intake   1262 ml  Output   2600 ml  Net  -1338 ml    General: Alert, awake, oriented x3, in no acute distress Neck:  JVP is normal Heart: Regular rate and rhythm, without murmurs, rubs, gallops.  Lungs: Rales at R base   Exemities:  2+  edema.   Neuro: Grossly intact, nonfocal.   Lab Results: Results for orders placed or performed during the hospital encounter of 12/04/14 (from the past 24 hour(s))  Glucose, capillary     Status: Abnormal   Collection Time: 12/14/14 11:46 AM  Result Value Ref Range   Glucose-Capillary 107 (H) 70 - 99 mg/dL  Glucose, capillary     Status: None   Collection Time: 12/14/14  4:56 PM  Result Value Ref Range   Glucose-Capillary 88 70 - 99 mg/dL  Glucose, capillary     Status: Abnormal   Collection Time: 12/14/14  9:19 PM  Result Value Ref Range   Glucose-Capillary 111 (H) 70 - 99 mg/dL  Renal function panel     Status: Abnormal   Collection Time: 12/15/14  3:20 AM  Result Value Ref Range   Sodium 137 135 - 145 mmol/L   Potassium 4.8 3.5 - 5.1 mmol/L   Chloride 106 96 - 112 mEq/L   CO2 22 19 - 32 mmol/L   Glucose, Bld 95 70 - 99 mg/dL   BUN 68 (H) 6 - 23 mg/dL   Creatinine, Ser 2.74 (H) 0.50 - 1.35 mg/dL   Calcium 8.6 8.4 - 10.5 mg/dL   Phosphorus 4.1 2.3 - 4.6 mg/dL   Albumin 3.5 3.5 - 5.2 g/dL   GFR calc non Af Amer 22 (L) >90 mL/min   GFR calc Af Amer 25 (L) >90 mL/min   Anion gap 9 5 - 15  Glucose, capillary     Status: None   Collection Time:  12/15/14  7:49 AM  Result Value Ref Range   Glucose-Capillary 95 70 - 99 mg/dL   Comment 1 Notify RN     Studies/Results: No results found.  Medications: Reviewed     @PROBHOSP @  1  Aortic insufficiency  Patient being evaluated for possible surgery PFTs done earlier today Still with signif volume overload on exam  Wt is unchanged over past few days  Neg negatve about 2.4 liters by I/O since cath  On 1/5 PCWP was 33.  Still needs more  I question if IV diuresis alone will be adequate    2  Acute on chronic systolic CHF  Due to 1  3.  CKD   LOS: 11 days   [name] 12/15/2014, 10:15 AM

## 2014-12-15 NOTE — Progress Notes (Signed)
Patient ID: [name] [name], male   DOB: 04/01/1946, 69 y.o.   MRN: [id number]  [facility] Progress Note    Assessment/ Plan:   1. Acute renal failure on chronic kidney disease stage IV in a patient with a solitary kidney: Appears to have stable renal function after contrast exposure-continues to show good response to diuretic therapy-furosemide 120 mg intravenously 3 times a day-the goal is to optimize his volume status prior to any surgical intervention. 2. Acute exacerbation of congestive heart failure-history of severe aortic insufficiency with an ejection fraction of 40-45%-ongoing diuretic therapy and awaiting surgical evaluation for possible management options. Right heart catheterization showed moderate to severe aortic regurgitation and severe pulmonary hypertension/volume overload 3. Panhypopituitarism: Ongoing replacement therapy 4. Morbid obesity-possibly with an element of OSA 5. Anemia of chronic disease: Hemoglobin acceptable but he has evidence of iron deficiency-low iron saturation/ferritin. Getting intravenous iron 6. Rash: Possibly from the newly started medications, will discontinue intravenous iron  Subjective:   Worse to be feeling well with the exception of a newly developing rash over his arms. Does not recall seeing thoracic surgery yesterday.    Objective:   BP 156/45 mmHg  Pulse 79  Temp(Src) 97.3 F (36.3 C) (Oral)  Resp 20  Ht 6\' 1"  (1.854 m)  Wt 125.42 kg (276 lb 8 oz)  BMI 36.49 kg/m2  SpO2 96%  Intake/Output Summary (Last 24 hours) at 12/15/14 0936 Last data filed at 12/15/14 6073  Gross per 24 hour  Intake   1262 ml  Output   2600 ml  Net  -1338 ml   Weight change: -2.631 kg (-5 lb 12.8 oz)  Physical Exam: Gen: Comfortably sitting up in his recliner-intermittently sleepy CVS: Pulse regular rate and rhythm Resp: Clear to auscultation bilaterally-no rales Abd: Soft, distended, firm Ext: 2+ lower extremity  edema-pitting  Imaging: No results found.  Labs: BMET  Recent Labs Lab 12/09/14 0400 12/10/14 0315 12/11/14 0347 12/12/14 0400 12/13/14 0413 12/14/14 0630 12/15/14 0320  NA 137 137 135 136 137 136 137  K 4.3 4.5 5.1 4.9 4.5 4.7 4.8  CL 105 105 106 105 105 105 106  CO2 20 21 19  17* 22 21 22   GLUCOSE 121* 96 97 82 122* 100* 95  BUN 65* 66* 68* 74* 74* 73* 68*  CREATININE 2.85* 2.74* 2.69* 2.68* 2.76* 2.79* 2.74*  CALCIUM 8.3* 8.7 8.7 8.7 8.5 8.5 8.6  PHOS 3.9 4.1 4.2 4.9* 4.7* 4.1 4.1   CBC  Recent Labs Lab 12/11/14 0347  WBC 7.0  HGB 11.6*  HCT 38.0*  MCV 87.2  PLT 197    Medications:    . antiseptic oral rinse  7 mL Mouth Rinse BID  . aspirin EC  325 mg Oral Daily  . atorvastatin  40 mg Oral q1800  . budesonide-formoterol  2 puff Inhalation BID  . cloNIDine  0.2 mg Oral TID  . desmopressin  0.2 mg Oral BID  . dexamethasone  0.5 mg Oral Daily  . ferric gluconate (FERRLECIT/NULECIT) IV  250 mg Intravenous Q M,W,F  . furosemide  120 mg Intravenous TID  . heparin  5,000 Units Subcutaneous 3 times per day  . insulin aspart  0-9 Units Subcutaneous TID WC  . levothyroxine  175 mcg Oral QAC breakfast  . phenytoin  200 mg Oral Once per day on Mon Thu  . phenytoin  300 mg Oral Once per day on Sun Tue Wed Fri Sat  . sodium bicarbonate  650 mg Oral  BID  . sodium chloride  3 mL Intravenous Q12H  . sodium chloride  3 mL Intravenous Q12H  . sodium chloride  3 mL Intravenous Q12H  . tiotropium  18 mcg Inhalation Daily    Elmarie Shiley, MD 12/15/2014, 9:36 AM

## 2014-12-15 NOTE — Progress Notes (Signed)
CARDIAC REHAB PHASE I   PRE:  Rate/Rhythm: 81 First degree HB    BP: sitting 128/46    SaO2: 100 2L  MODE:  Ambulation: 80 ft   POST:  Rate/Rhythm: 84 first degree HB    BP: sitting 124/48     SaO2: 96 2L  Pt able to stand on his own but has to bend both knees outward and use arms, esp from low recliner. C/o right foot/toes pain today. He sts he hit it earlier today on the w/c. I asked RN to look at it. Pt denies SOB today. Return to recliner with instructions to keep his feet elevated. His right leg/foot is especially swollen and red. Will continue to follow. Mon[place], [place], ACSM 12/15/2014 2:51 PM

## 2014-12-15 NOTE — Progress Notes (Signed)
Pt continues to refuse CPAP. Understands to call RN/RT if he change his mind.

## 2014-12-15 NOTE — Progress Notes (Signed)
1145 Pt just back from PFTs. Stated he walked earlier and too tired now. We will follow up later. Graylon Good RN BSN 12/15/2014 11:45 AM

## 2014-12-15 NOTE — Progress Notes (Signed)
Patient Demographics  [name] [name], is a 69 y.o. male, DOB - [birth year], UTM:[id number]  Admit date - 12/04/2014   Admitting Physician Troy Sine, MD  Outpatient Primary MD for the patient is [facility], MD  LOS - 11   Chief Complaint  Patient presents with  . Shortness of Breath      Admission history of present illness/brief narrative: 69 year-old gentleman presenting to the Emergency Dept today with multiple complaints. These include chest pain, shortness of breath, and RUQ pain. He has been previously scheduled for elective cardiac catheterization 12/29 to evaluate severe aortic insufficiency and declining LV function. However, the patient came to the emergency department today because he has noticed an acute change in his symptoms over the last 24 hours. He complains of aching all over. He also has had some nausea and vomiting. He complains of pain in his upper abdominal area. He has also had chest pain. His breathing has been worse over the last 2-3 days. He does feel that his "retaining more fluid." He admits to dietary indiscretion with salt. He has been taking his medications. Right upper quadrant ultrasound was done and there concern about acalculous cholecystitis general surgery recommended HIDA scan which was negative.    Assessment & Plan    Principal Problem:   Sepsis Active Problems:   Obese   Hypertension   Sleep apnea   Hypothyroid   Panhypopituitarism   Moderate aortic regurgitation   Diabetes   CKD (chronic kidney disease), stage IV   Acalculous cholecystitis   Abdominal pain   Acute on chronic combined systolic and diastolic CHF (congestive heart failure)   SOB (shortness of breath)   Fever   Aortic insufficiency   Chest pain: Cath without significant CAD. Severely elevated right and left heart filling pressures. 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  TCTS evaluating.  Abdominal pain, nausea, vomiting, HIDA negative.   Fever -BC x2 NGTD -vanc/zosyn- transition over to PO cipro/flagyl for a 10 days course. Now completed -CT scan abd show inflammation- tender in RUQ is gone after ABX -outpatient surgery follow up  Acute on chronic systolic/diastolic heart failure,  -LVEF 40-45 percent with diffuse hypokinesis, diastolic dysfunction, moderate to severe aortic regurgitation, severe pulmonary hypertension. Massively fluid overloaded. Good uopon lasix 120 tid  Panhypopituitarism desmopressin, Decadron, Synthroid.  Seizure disorder phenytoin. Level ok on admission  Diabetes mellitus controlled  COPD -No active wheezing, on 2 L at baseline, appears stable  chronic renal failure-stage IV May require dialysis soon.  Obstructive sleep apnea: Noncompliant with CPAP  Code Status: Full  Family Communication:   Disposition Plan: Remains on telemetry   Procedures  Cardiac cath  Consults   Cardiology nephrology Surgery TCTS  Medications  Scheduled Meds: . antiseptic oral rinse  7 mL Mouth Rinse BID  . aspirin EC  325 mg Oral Daily  . atorvastatin  40 mg Oral q1800  . budesonide-formoterol  2 puff Inhalation BID  . cloNIDine  0.2 mg Oral TID  . desmopressin  0.2 mg Oral BID  . dexamethasone  0.5 mg Oral Daily  . furosemide  120 mg Intravenous TID  . heparin  5,000 Units Subcutaneous 3 times per  day  . insulin aspart  0-9 Units Subcutaneous TID WC  . levothyroxine  175 mcg Oral QAC breakfast  . phenytoin  200 mg Oral Once per day on Mon Thu  . phenytoin  300 mg Oral Once per day on Sun Tue Wed Fri Sat  . sodium bicarbonate  650 mg Oral BID  . sodium chloride  3 mL Intravenous Q12H  . sodium chloride  3 mL Intravenous Q12H  . sodium chloride  3 mL Intravenous Q12H  . tiotropium  18 mcg Inhalation Daily   Continuous Infusions:   PRN Meds:.sodium chloride, sodium chloride,  acetaminophen, acetaminophen, albuterol, diphenhydrAMINE, magic mouthwash, ondansetron (ZOFRAN) IV, ondansetron, sodium chloride, sodium chloride, sodium chloride  DVT Prophylaxis   Heparin -  Lab Results  Component Value Date   PLT 197 12/11/2014    Antibiotics    Anti-infectives    Start     Dose/Rate Route Frequency Ordered Stop   12/11/14 2200  ciprofloxacin (CIPRO) tablet 250 mg  Status:  Discontinued     250 mg Oral 2 times daily 12/11/14 2010 12/13/14 2111   12/11/14 2200  metroNIDAZOLE (FLAGYL) tablet 500 mg  Status:  Discontinued     500 mg Oral 3 times per day 12/11/14 2010 12/13/14 2111   12/09/14 1400  metroNIDAZOLE (FLAGYL) tablet 500 mg  Status:  Discontinued     500 mg Oral 3 times per day 12/09/14 1111 12/11/14 1509   12/09/14 1200  amoxicillin-clavulanate (AUGMENTIN) 875-125 MG per tablet 1 tablet  Status:  Discontinued     1 tablet Oral Every 12 hours 12/09/14 1111 12/09/14 1124   12/09/14 1200  ciprofloxacin (CIPRO) tablet 500 mg  Status:  Discontinued     500 mg Oral 2 times daily 12/09/14 1124 12/11/14 1509   12/08/14 0900  vancomycin (VANCOCIN) 1,750 mg in sodium chloride 0.9 % 500 mL IVPB  Status:  Discontinued     1,750 mg250 mL/hr over 120 Minutes Intravenous Every 48 hours 12/06/14 0753 12/09/14 1111   12/06/14 1500  piperacillin-tazobactam (ZOSYN) IVPB 3.375 g  Status:  Discontinued     3.375 g12.5 mL/hr over 240 Minutes Intravenous Every 8 hours 12/06/14 0748 12/09/14 1111   12/06/14 0800  piperacillin-tazobactam (ZOSYN) IVPB 3.375 g     3.375 g100 mL/hr over 30 Minutes Intravenous  Once 12/06/14 0747 12/06/14 0921   12/06/14 0800  vancomycin (VANCOCIN) 2,000 mg in sodium chloride 0.9 % 500 mL IVPB     2,000 mg250 mL/hr over 120 Minutes Intravenous  Once 12/06/14 0747 12/06/14 1211   12/06/14 0700  cefTRIAXone (ROCEPHIN) 1 g in dextrose 5 % 50 mL IVPB - Premix  Status:  Discontinued     1 g100 mL/hr over 30 Minutes Intravenous  Once 12/06/14 0658 12/06/14  0748   12/04/14 1830  cefTRIAXone (ROCEPHIN) 1 g in dextrose 5 % 50 mL IVPB - Premix  Status:  Discontinued     1 g100 mL/hr over 30 Minutes Intravenous Every 24 hours 12/04/14 1727 12/05/14 1717      Subjective: Feels slightly less short of breath.    Objective:   Filed Vitals:   12/14/14 2117 12/15/14 0500 12/15/14 0650 12/15/14 0911  BP: 124/47 156/45    Pulse: 80 79    Temp: 97.4 F (36.3 C) 97.3 F (36.3 C)    TempSrc: Oral Oral    Resp: 20 20    Height:      Weight:   125.42 kg (276 lb 8 oz)  SpO2: 97% 97%  96%    Wt Readings from Last 3 Encounters:  12/15/14 125.42 kg (276 lb 8 oz)  11/16/14 127.007 kg (280 lb)  06/12/14 121.201 kg (267 lb 3.2 oz)     Intake/Output Summary (Last 24 hours) at 12/15/14 1133 Last data filed at 12/15/14 9233  Gross per 24 hour  Intake   1262 ml  Output   2600 ml  Net  -1338 ml     Physical Exam  Awake in chair sleepy. Answers questions and follows commands. Symmetrical Chest wall movement, Good air movement bilaterally, CTAB RRR,No Gallops,Rubs or new Murmurs, No Parasternal Heave +ve B.Sounds, obese +2edema   Data Review   Micro Results Recent Results (from the past 240 hour(s))  Culture, blood (routine x 2)     Status: None   Collection Time: 12/06/14  8:45 AM  Result Value Ref Range Status   Specimen Description BLOOD LEFT ARM  Final   Special Requests BOTTLES DRAWN AEROBIC AND ANAEROBIC 10CC  Final   Culture   Final    NO GROWTH 5 DAYS Note: Culture results may be compromised due to an inadequate volume of blood received in culture bottles. Performed at [facility]    Report Status 12/12/2014 FINAL  Final  Culture, blood (routine x 2)     Status: None   Collection Time: 12/06/14  8:50 AM  Result Value Ref Range Status   Specimen Description BLOOD RIGHT HAND  Final   Special Requests BOTTLES DRAWN AEROBIC ONLY 10CC  Final   Culture   Final    NO GROWTH 5 DAYS Note: Culture results may be  compromised due to an excessive volume of blood received in culture bottles. Performed at [facility]    Report Status 12/12/2014 FINAL  Final  Urine culture     Status: None   Collection Time: 12/06/14 10:30 PM  Result Value Ref Range Status   Specimen Description URINE, CLEAN CATCH  Final   Special Requests Normal  Final   Colony Count NO GROWTH Performed at [facility]   Final   Culture NO GROWTH Performed at [facility]   Final   Report Status 12/10/2014 FINAL  Final  Clostridium Difficile by PCR     Status: None   Collection Time: 12/06/14 10:42 PM  Result Value Ref Range Status   C difficile by pcr NEGATIVE NEGATIVE Final  Clostridium Difficile by PCR     Status: None   Collection Time: 12/10/14  1:19 AM  Result Value Ref Range Status   C difficile by pcr NEGATIVE NEGATIVE Final    Radiology Reports No results found.  CBC  Recent Labs Lab 12/11/14 0347  WBC 7.0  HGB 11.6*  HCT 38.0*  PLT 197  MCV 87.2  MCH 26.6  MCHC 30.5  RDW 17.2*    Chemistries   Recent Labs Lab 12/09/14 0400  12/11/14 0347 12/12/14 0400 12/13/14 0413 12/14/14 0630 12/15/14 0320  NA 137  < > 135 136 137 136 137  K 4.3  < > 5.1 4.9 4.5 4.7 4.8  CL 105  < > 106 105 105 105 106  CO2 20  < > 19 17* 22 21 22   GLUCOSE 121*  < > 97 82 122* 100* 95  BUN 65*  < > 68* 74* 74* 73* 68*  CREATININE 2.85*  < > 2.69* 2.68* 2.76* 2.79* 2.74*  CALCIUM 8.3*  < > 8.7 8.7 8.5 8.5 8.6  MG 2.5  --   --   --   --   --   --   < > = values in this interval not displayed. ------------------------------------------------------------------------------------------------------------------ estimated creatinine clearance is 34.8 mL/min (by C-G formula based on Cr of 2.74). ------------------------------------------------------------------------------------------------------------------ No results for input(s): HGBA1C in the last 72  hours. ------------------------------------------------------------------------------------------------------------------ No results for input(s): CHOL, HDL, LDLCALC, TRIG, CHOLHDL, LDLDIRECT in the last 72 hours. ------------------------------------------------------------------------------------------------------------------ No results for input(s): TSH, T4TOTAL, T3FREE, THYROIDAB in the last 72 hours.  Invalid input(s): FREET3 ------------------------------------------------------------------------------------------------------------------ No results for input(s): VITAMINB12, FOLATE, FERRITIN, TIBC, IRON, RETICCTPCT in the last 72 hours.  Recent Labs  12/14/14 0126 12/14/14 0725 12/14/14 1146 12/14/14 1656 12/14/14 2119 12/15/14 0749  GLUCAP 95 148* 107* 88 111* 95    Coagulation profile  Recent Labs Lab 12/12/14 0400  INR 1.50*    No results for input(s): DDIMER in the last 72 hours.  Cardiac Enzymes No results for input(s): CKMB, TROPONINI, MYOGLOBIN in the last 168 hours.  Invalid input(s): CK ------------------------------------------------------------------------------------------------------------------ Invalid input(s): POCBNP     Time Spent in minutes   15 minutes   Clarke Amburn L MD on 12/15/2014 at 11:33 AM  [email or web address] - password [facility]   [facility]

## 2014-12-15 NOTE — Progress Notes (Signed)
Patient ID: [name] [name], male   DOB: 1946-04-25, 69 y.o.   MRN: [id number]  CT Surgery  He walked a short distance twice today.  His PFT's show severe obstruction with an FEV1 of 1.26 or 41% predicted and lung volumes are severely reduced indicating a severe restrictive defect, probably due to his morbid obesity. He could not do the diffusion capacity test. At his age of 30 with morbid obesity, severe lung disease as noted above with OSA on chronic home oxygen, stage IV CKD, deconditioning and debilitation with poor mobility and little motivation I don't think AVR surgery is an option for him. His STS risk of mortality is 18% and risk of major morbidity or mortality is 65% with surgery with a 67% risk of prolonged ventilation. He has severe pulmonary hypertension which may not all be due to his AI but could also be chronic due to his obesity and lung disease. I have reviewed all of his information several times and thought a lot about it and I think medical therapy is really the best option for him. I discussed this with him.

## 2014-12-15 NOTE — Progress Notes (Signed)
CSW (Clinical Education officer, museum) continues to monitor pt progress and provide updates to facility.  [place], [place]

## 2014-12-16 LAB — RENAL FUNCTION PANEL
ANION GAP: 8 (ref 5–15)
Albumin: 3.3 g/dL — ABNORMAL LOW (ref 3.5–5.2)
BUN: 64 mg/dL — AB (ref 6–23)
CHLORIDE: 104 meq/L (ref 96–112)
CO2: 23 mmol/L (ref 19–32)
Calcium: 8.3 mg/dL — ABNORMAL LOW (ref 8.4–10.5)
Creatinine, Ser: 2.72 mg/dL — ABNORMAL HIGH (ref 0.50–1.35)
GFR calc Af Amer: 26 mL/min — ABNORMAL LOW (ref >90)
GFR, EST NON AFRICAN AMERICAN: 22 mL/min — AB (ref >90)
Glucose, Bld: 102 mg/dL — ABNORMAL HIGH (ref 70–99)
PHOSPHORUS: 3.5 mg/dL (ref 2.3–4.6)
POTASSIUM: 4.6 mmol/L (ref 3.5–5.1)
Sodium: 135 mmol/L (ref 135–145)

## 2014-12-16 LAB — GLUCOSE, CAPILLARY
Glucose-Capillary: 92 mg/dL (ref 70–99)
Glucose-Capillary: 102 mg/dL — ABNORMAL HIGH (ref 70–99)
Glucose-Capillary: 83 mg/dL (ref 70–99)
Glucose-Capillary: 105 mg/dL — ABNORMAL HIGH (ref 70–99)

## 2014-12-16 MED ORDER — METOLAZONE 5 MG PO TABS
10.0000 mg | ORAL_TABLET | Freq: Every day | ORAL | Status: DC
  Administered 2014-12-16 – 2014-12-18 (×3): 10 mg via ORAL
  Filled 2014-12-16 (×3): qty 2

## 2014-12-16 MED ORDER — CAMPHOR-MENTHOL 0.5-0.5 % EX LOTN
TOPICAL_LOTION | CUTANEOUS | Status: DC | PRN
  Administered 2014-12-16: 1 via TOPICAL
  Filled 2014-12-16: qty 222

## 2014-12-16 NOTE — Progress Notes (Signed)
CARDIAC REHAB PHASE I   PRE:  Rate/Rhythm: 80 SR  BP:  Supine:   Sitting: 120/37  Standing:    SaO2: 99% 2L O2  MODE:  Ambulation: 100 ft   POST:  Rate/Rhythm: 82  BP:  Supine:   Sitting: 125/43  Standing:    SaO2: 96% 2L O2  1526-1552 Pt tolerated ambulation fair with assist x1 and pushing rolling walker, gait slow, steady, no c/o, VSS.  Sol Passer, MS, ACSM CCEP

## 2014-12-16 NOTE — Progress Notes (Signed)
Patient ID: [name] [name], male   DOB: Apr 29, 1946, 69 y.o.   MRN: [id number]  [facility] Progress Note    Assessment/ Plan:   1. Acute renal failure on chronic kidney disease stage IV in a patient with a solitary kidney: Appears to have stable renal function after contrast. Marginal response to current dose of furosemide, will add metolazone today and continue to monitor urine output. We'll continue to push diuretics as far as possible and undertake dialysis when clearly diuretic refractory for his significant volume overload. 2. Acute exacerbation of congestive heart failure- Right heart catheterization showed moderate to severe aortic regurgitation and severe pulmonary hypertension/volume overload. Seen by Dr. Cyndia Bent of cardiothoracic surgery and found to be of exceedingly high surgical risk-medical management recommended 3. Panhypopituitarism: Ongoing replacement therapy 4. Morbid obesity-possibly with an element of OSA but refuses CPAP 5. Anemia of chronic disease: Hemoglobin acceptable but he has evidence of iron deficiency-low iron saturation/ferritin. Given intravenous iron 2 doses-then discontinued because of skin rash.  Subjective:   Reports to be feeling fair-continues to refuse CPAP. Cardiothoracic surgery notes reviewed from yesterday    Objective:   BP 123/43 mmHg  Pulse 80  Temp(Src) 99.5 F (37.5 C) (Oral)  Resp 16  Ht 6\' 1"  (1.854 m)  Wt 124.966 kg (275 lb 8 oz)  BMI 36.36 kg/m2  SpO2 98%  Intake/Output Summary (Last 24 hours) at 12/16/14 1035 Last data filed at 12/16/14 1030  Gross per 24 hour  Intake   1043 ml  Output   2175 ml  Net  -1132 ml   Weight change: -0.454 kg (-1 lb)  Physical Exam: Gen: Comfortably sitting in his recliner CVS: Pulse regular in rate and rhythm Resp: Decreased breath sounds at bases Abd: Soft, obese, nontender Ext: 2-3+ lower extremity edema  Imaging: No results found.  Labs: BMET  Recent Labs Lab  12/10/14 0315 12/11/14 0347 12/12/14 0400 12/13/14 0413 12/14/14 0630 12/15/14 0320 12/16/14 0345  NA 137 135 136 137 136 137 135  K 4.5 5.1 4.9 4.5 4.7 4.8 4.6  CL 105 106 105 105 105 106 104  CO2 21 19 17* 22 21 22 23   GLUCOSE 96 97 82 122* 100* 95 102*  BUN 66* 68* 74* 74* 73* 68* 64*  CREATININE 2.74* 2.69* 2.68* 2.76* 2.79* 2.74* 2.72*  CALCIUM 8.7 8.7 8.7 8.5 8.5 8.6 8.3*  PHOS 4.1 4.2 4.9* 4.7* 4.1 4.1 3.5   CBC  Recent Labs Lab 12/11/14 0347  WBC 7.0  HGB 11.6*  HCT 38.0*  MCV 87.2  PLT 197    Medications:    . antiseptic oral rinse  7 mL Mouth Rinse BID  . aspirin EC  325 mg Oral Daily  . atorvastatin  40 mg Oral q1800  . budesonide-formoterol  2 puff Inhalation BID  . cloNIDine  0.2 mg Oral TID  . desmopressin  0.2 mg Oral BID  . dexamethasone  0.5 mg Oral Daily  . furosemide  120 mg Intravenous TID  . heparin  5,000 Units Subcutaneous 3 times per day  . insulin aspart  0-9 Units Subcutaneous TID WC  . levothyroxine  175 mcg Oral QAC breakfast  . phenytoin  200 mg Oral Once per day on Mon Thu  . phenytoin  300 mg Oral Once per day on Sun Tue Wed Fri Sat  . sodium bicarbonate  650 mg Oral BID  . sodium chloride  3 mL Intravenous Q12H  . sodium chloride  3 mL  Intravenous Q12H  . sodium chloride  3 mL Intravenous Q12H  . tiotropium  18 mcg Inhalation Daily   Elmarie Shiley, MD 12/16/2014, 10:35 AM

## 2014-12-16 NOTE — Progress Notes (Signed)
Patient Demographics  [name] [name], is a 69 y.o. male, DOB - [birth year], YHC:[id number]  Admit date - 12/04/2014   Admitting Physician Troy Sine, MD  Outpatient Primary MD for the patient is [facility], MD  LOS - 12   Chief Complaint  Patient presents with  . Shortness of Breath      Admission history of present illness/brief narrative: 69 year-old gentleman presenting to the Emergency Dept today with multiple complaints. These include chest pain, shortness of breath, and RUQ pain. He has been previously scheduled for elective cardiac catheterization 12/29 to evaluate severe aortic insufficiency and declining LV function. However, the patient came to the emergency department today because he has noticed an acute change in his symptoms over the last 24 hours. He complains of aching all over. He also has had some nausea and vomiting. He complains of pain in his upper abdominal area. He has also had chest pain. His breathing has been worse over the last 2-3 days. He does feel that his "retaining more fluid." He admits to dietary indiscretion with salt. He has been taking his medications. Right upper quadrant ultrasound was done and there concern about acalculous cholecystitis general surgery recommended HIDA scan which was negative.    Assessment & Plan    Principal Problem:   Sepsis Active Problems:   Obese   Hypertension   Sleep apnea   Hypothyroid   Panhypopituitarism   Moderate aortic regurgitation   Diabetes   CKD (chronic kidney disease), stage IV   Acalculous cholecystitis   Abdominal pain   Acute on chronic combined systolic and diastolic CHF (congestive heart failure)   SOB (shortness of breath)   Fever   Aortic insufficiency   Chest pain: Cath without significant CAD. Severely elevated right and left heart filling pressures. 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  TCTS says high risk- medical management only  Abdominal pain, nausea, vomiting, HIDA negative.   Fever -BC x2 NGTD -vanc/zosyn- transition over to PO cipro/flagyl for a 10 days course. Now completed -CT scan abd show inflammation- tender in RUQ is gone after ABX -outpatient surgery follow up  Acute on chronic systolic/diastolic heart failure,  -LVEF 40-45 percent with diffuse hypokinesis, diastolic dysfunction, moderate to severe aortic regurgitation, severe pulmonary hypertension. Massively fluid overloaded. Lasix plus metolazone per renal  Panhypopituitarism desmopressin, Decadron, Synthroid.  Seizure disorder phenytoin. Level ok on admission  Diabetes mellitus controlled  COPD -No active wheezing, on 2 L at baseline, appears stable  chronic renal failure-stage IV Per renal  Obstructive sleep apnea: Noncompliant with CPAP  Code Status: Full  Family Communication:   Disposition Plan: Remains on telemetry   Procedures  Cardiac cath  Consults   Cardiology nephrology Surgery TCTS  Medications  Scheduled Meds: . antiseptic oral rinse  7 mL Mouth Rinse BID  . aspirin EC  325 mg Oral Daily  . atorvastatin  40 mg Oral q1800  . budesonide-formoterol  2 puff Inhalation BID  . cloNIDine  0.2 mg Oral TID  . desmopressin  0.2 mg Oral BID  . dexamethasone  0.5 mg Oral Daily  . furosemide  120 mg Intravenous TID  . heparin  5,000 Units Subcutaneous  3 times per day  . insulin aspart  0-9 Units Subcutaneous TID WC  . levothyroxine  175 mcg Oral QAC breakfast  . metolazone  10 mg Oral Daily  . phenytoin  200 mg Oral Once per day on Mon Thu  . phenytoin  300 mg Oral Once per day on Sun Tue Wed Fri Sat  . sodium bicarbonate  650 mg Oral BID  . sodium chloride  3 mL Intravenous Q12H  . sodium chloride  3 mL Intravenous Q12H  . sodium chloride  3 mL Intravenous Q12H  . tiotropium  18 mcg Inhalation Daily   Continuous  Infusions:   PRN Meds:.sodium chloride, sodium chloride, acetaminophen, acetaminophen, albuterol, camphor-menthol, diphenhydrAMINE, magic mouthwash, ondansetron (ZOFRAN) IV, ondansetron, sodium chloride, sodium chloride, sodium chloride  DVT Prophylaxis   Heparin -  Lab Results  Component Value Date   PLT 197 12/11/2014    Antibiotics    Anti-infectives    Start     Dose/Rate Route Frequency Ordered Stop   12/11/14 2200  ciprofloxacin (CIPRO) tablet 250 mg  Status:  Discontinued     250 mg Oral 2 times daily 12/11/14 2010 12/13/14 2111   12/11/14 2200  metroNIDAZOLE (FLAGYL) tablet 500 mg  Status:  Discontinued     500 mg Oral 3 times per day 12/11/14 2010 12/13/14 2111   12/09/14 1400  metroNIDAZOLE (FLAGYL) tablet 500 mg  Status:  Discontinued     500 mg Oral 3 times per day 12/09/14 1111 12/11/14 1509   12/09/14 1200  amoxicillin-clavulanate (AUGMENTIN) 875-125 MG per tablet 1 tablet  Status:  Discontinued     1 tablet Oral Every 12 hours 12/09/14 1111 12/09/14 1124   12/09/14 1200  ciprofloxacin (CIPRO) tablet 500 mg  Status:  Discontinued     500 mg Oral 2 times daily 12/09/14 1124 12/11/14 1509   12/08/14 0900  vancomycin (VANCOCIN) 1,750 mg in sodium chloride 0.9 % 500 mL IVPB  Status:  Discontinued     1,750 mg250 mL/hr over 120 Minutes Intravenous Every 48 hours 12/06/14 0753 12/09/14 1111   12/06/14 1500  piperacillin-tazobactam (ZOSYN) IVPB 3.375 g  Status:  Discontinued     3.375 g12.5 mL/hr over 240 Minutes Intravenous Every 8 hours 12/06/14 0748 12/09/14 1111   12/06/14 0800  piperacillin-tazobactam (ZOSYN) IVPB 3.375 g     3.375 g100 mL/hr over 30 Minutes Intravenous  Once 12/06/14 0747 12/06/14 0921   12/06/14 0800  vancomycin (VANCOCIN) 2,000 mg in sodium chloride 0.9 % 500 mL IVPB     2,000 mg250 mL/hr over 120 Minutes Intravenous  Once 12/06/14 0747 12/06/14 1211   12/06/14 0700  cefTRIAXone (ROCEPHIN) 1 g in dextrose 5 % 50 mL IVPB - Premix  Status:  Discontinued      1 g100 mL/hr over 30 Minutes Intravenous  Once 12/06/14 0658 12/06/14 0748   12/04/14 1830  cefTRIAXone (ROCEPHIN) 1 g in dextrose 5 % 50 mL IVPB - Premix  Status:  Discontinued     1 g100 mL/hr over 30 Minutes Intravenous Every 24 hours 12/04/14 1727 12/05/14 1717      Subjective: tired of being in the hospital    Objective:   Filed Vitals:   12/15/14 2100 12/16/14 0500 12/16/14 0820 12/16/14 0825  BP: 119/49 123/43    Pulse: 78 80    Temp: 98 F (36.7 C) 99.5 F (37.5 C)    TempSrc:      Resp: 14 16    Height:  Weight:  124.966 kg (275 lb 8 oz)    SpO2: 98% 100% 99% 98%    Wt Readings from Last 3 Encounters:  12/16/14 124.966 kg (275 lb 8 oz)  11/16/14 127.007 kg (280 lb)  06/12/14 121.201 kg (267 lb 3.2 oz)     Intake/Output Summary (Last 24 hours) at 12/16/14 1047 Last data filed at 12/16/14 1030  Gross per 24 hour  Intake   1043 ml  Output   2175 ml  Net  -1132 ml     Physical Exam  Awake in chair sleepy. Answers questions and follows commands. Symmetrical Chest wall movement, Good air movement bilaterally, CTAB RRR,No Gallops,Rubs or new Murmurs, No Parasternal Heave +ve B.Sounds, obese +2edema   Data Review   Micro Results Recent Results (from the past 240 hour(s))  Urine culture     Status: None   Collection Time: 12/06/14 10:30 PM  Result Value Ref Range Status   Specimen Description URINE, CLEAN CATCH  Final   Special Requests Normal  Final   Colony Count NO GROWTH Performed at [facility]   Final   Culture NO GROWTH Performed at [facility]   Final   Report Status 12/10/2014 FINAL  Final  Clostridium Difficile by PCR     Status: None   Collection Time: 12/06/14 10:42 PM  Result Value Ref Range Status   C difficile by pcr NEGATIVE NEGATIVE Final  Clostridium Difficile by PCR     Status: None   Collection Time: 12/10/14  1:19 AM  Result Value Ref Range Status   C difficile by pcr NEGATIVE NEGATIVE Final     Radiology Reports No results found.  CBC  Recent Labs Lab 12/11/14 0347  WBC 7.0  HGB 11.6*  HCT 38.0*  PLT 197  MCV 87.2  MCH 26.6  MCHC 30.5  RDW 17.2*    Chemistries   Recent Labs Lab 12/12/14 0400 12/13/14 0413 12/14/14 0630 12/15/14 0320 12/16/14 0345  NA 136 137 136 137 135  K 4.9 4.5 4.7 4.8 4.6  CL 105 105 105 106 104  CO2 17* 22 21 22 23   GLUCOSE 82 122* 100* 95 102*  BUN 74* 74* 73* 68* 64*  CREATININE 2.68* 2.76* 2.79* 2.74* 2.72*  CALCIUM 8.7 8.5 8.5 8.6 8.3*   ------------------------------------------------------------------------------------------------------------------ estimated creatinine clearance is 35 mL/min (by C-G formula based on Cr of 2.72). ------------------------------------------------------------------------------------------------------------------ No results for input(s): HGBA1C in the last 72 hours. ------------------------------------------------------------------------------------------------------------------ No results for input(s): CHOL, HDL, LDLCALC, TRIG, CHOLHDL, LDLDIRECT in the last 72 hours. ------------------------------------------------------------------------------------------------------------------ No results for input(s): TSH, T4TOTAL, T3FREE, THYROIDAB in the last 72 hours.  Invalid input(s): FREET3 ------------------------------------------------------------------------------------------------------------------ No results for input(s): VITAMINB12, FOLATE, FERRITIN, TIBC, IRON, RETICCTPCT in the last 72 hours.  Recent Labs  12/14/14 2119 12/15/14 0749 12/15/14 1149 12/15/14 1659 12/15/14 2120 12/16/14 0729  GLUCAP 111* 95 89 101* 90 92    Coagulation profile  Recent Labs Lab 12/12/14 0400  INR 1.50*    No results for input(s): DDIMER in the last 72 hours.  Cardiac Enzymes No results for input(s): CKMB, TROPONINI, MYOGLOBIN in the last 168 hours.  Invalid input(s):  CK ------------------------------------------------------------------------------------------------------------------ Invalid input(s): POCBNP     Time Spent in minutes   15 minutes   Eliseo Squires, JESSICA MD on 12/16/2014 at 10:47 AM  [email or web address] - password [email or web address] [phone]  [facility]

## 2014-12-16 NOTE — Progress Notes (Signed)
Patient Name: [name] [name] Date of Encounter: 12/16/2014  Principal Problem:   Sepsis Active Problems:   Obese   Hypertension   Sleep apnea   Hypothyroid   Panhypopituitarism   Moderate aortic regurgitation   Diabetes   CKD (chronic kidney disease), stage IV   Acalculous cholecystitis   Abdominal pain   Acute on chronic combined systolic and diastolic CHF (congestive heart failure)   SOB (shortness of breath)   Fever   Aortic insufficiency   Length of Stay: 12  SUBJECTIVE  The patient states that he feels better and almost at his baseline, still significantly fluid overloaded on exam. He was walking with assistance and did ok.   CURRENT MEDS . antiseptic oral rinse  7 mL Mouth Rinse BID  . aspirin EC  325 mg Oral Daily  . atorvastatin  40 mg Oral q1800  . budesonide-formoterol  2 puff Inhalation BID  . cloNIDine  0.2 mg Oral TID  . desmopressin  0.2 mg Oral BID  . dexamethasone  0.5 mg Oral Daily  . furosemide  120 mg Intravenous TID  . heparin  5,000 Units Subcutaneous 3 times per day  . insulin aspart  0-9 Units Subcutaneous TID WC  . levothyroxine  175 mcg Oral QAC breakfast  . phenytoin  200 mg Oral Once per day on Mon Thu  . phenytoin  300 mg Oral Once per day on Sun Tue Wed Fri Sat  . sodium bicarbonate  650 mg Oral BID  . sodium chloride  3 mL Intravenous Q12H  . sodium chloride  3 mL Intravenous Q12H  . sodium chloride  3 mL Intravenous Q12H  . tiotropium  18 mcg Inhalation Daily    OBJECTIVE  Filed Vitals:   12/15/14 2100 12/16/14 0500 12/16/14 0820 12/16/14 0825  BP: 119/49 123/43    Pulse: 78 80    Temp: 98 F (36.7 C) 99.5 F (37.5 C)    TempSrc:      Resp: 14 16    Height:      Weight:  275 lb 8 oz (124.966 kg)    SpO2: 98% 100% 99% 98%    Intake/Output Summary (Last 24 hours) at 12/16/14 1040 Last data filed at 12/16/14 1030  Gross per 24 hour  Intake   1043 ml  Output   2175 ml  Net  -1132 ml   Filed Weights   12/14/14 0500  12/15/14 0650 12/16/14 0500  Weight: 282 lb 4.8 oz (128.05 kg) 276 lb 8 oz (125.42 kg) 275 lb 8 oz (124.966 kg)    PHYSICAL EXAM  General: slow, NAD. Neuro: Alert and oriented X 3. Moves all extremities spontaneously. Psych: Normal affect. HEENT:  Normal  Neck: Supple without bruits or JVD. Lungs:  Resp regular and unlabored, rales at the basis Heart: RRR no s3, s4, or murmurs. Abdomen: Soft, non-tender, non-distended, BS + x 4.  Extremities: No clubbing, cyanosis, skin changes of chronic venous stasis, 2+ LE edema. DP/PT/Radials 2+ and equal bilaterally.  Accessory Clinical Findings  CBC No results for input(s): WBC, NEUTROABS, HGB, HCT, MCV, PLT in the last 72 hours. Basic Metabolic Panel  Recent Labs  12/15/14 0320 12/16/14 0345  NA 137 135  K 4.8 4.6  CL 106 104  CO2 22 23  GLUCOSE 95 102*  BUN 68* 64*  CREATININE 2.74* 2.72*  CALCIUM 8.6 8.3*  PHOS 4.1 3.5   Liver Function Tests  Recent Labs  12/15/14 0320 12/16/14 0345  ALBUMIN 3.5 3.3*   TELE: SR    ASSESSMENT AND PLAN  1  Aortic insufficiency  Patient is a high surgical risk with severe restrictive pulmonary disease, Dr Cyndia Bent doesn't feel he is a good surgical candidate. We will continue aggressive medical management with diuresis. ? Need for dialysis in the future.  Still with signif volume overload on exam  Wt is unchanged over past few days  Neg negatve about 1.1 liters in the last 24 hours. On 1/5 PCWP was 33.  Still needs more  I question if IV diuresis.    2  Acute on chronic systolic CHF  Due to 1  3.  CKD stage 4, Crea stable with high dose of diuretics   Signed, Dorothy Spark MD, [facility] 12/16/2014

## 2014-12-16 NOTE — Progress Notes (Signed)
Pt refuses CPAP at this time. He stated he hasn't needed it. Pt was told to call the RT if he wants it later.

## 2014-12-17 LAB — RENAL FUNCTION PANEL
ALBUMIN: 3.2 g/dL — AB (ref 3.5–5.2)
ANION GAP: 9 (ref 5–15)
BUN: 63 mg/dL — ABNORMAL HIGH (ref 6–23)
CO2: 27 mmol/L (ref 19–32)
CREATININE: 2.74 mg/dL — AB (ref 0.50–1.35)
Calcium: 8.7 mg/dL (ref 8.4–10.5)
Chloride: 102 mEq/L (ref 96–112)
GFR calc non Af Amer: 22 mL/min — ABNORMAL LOW (ref >90)
GFR, EST AFRICAN AMERICAN: 25 mL/min — AB (ref >90)
Glucose, Bld: 107 mg/dL — ABNORMAL HIGH (ref 70–99)
Phosphorus: 4.3 mg/dL (ref 2.3–4.6)
Potassium: 4.6 mmol/L (ref 3.5–5.1)
Sodium: 138 mmol/L (ref 135–145)

## 2014-12-17 LAB — GLUCOSE, CAPILLARY
GLUCOSE-CAPILLARY: 143 mg/dL — AB (ref 70–99)
Glucose-Capillary: 96 mg/dL (ref 70–99)
Glucose-Capillary: 99 mg/dL (ref 70–99)
Glucose-Capillary: 132 mg/dL — ABNORMAL HIGH (ref 70–99)

## 2014-12-17 NOTE — Progress Notes (Signed)
Pt refuses CPAP and has not been using it while he's been here

## 2014-12-17 NOTE — Progress Notes (Signed)
Patient Demographics  [name] [name], is a 69 y.o. male, DOB - [birth year], HXT:[id number]  Admit date - 12/04/2014   Admitting Physician Troy Sine, MD  Outpatient Primary MD for the patient is [facility], MD  LOS - 20   Chief Complaint  Patient presents with  . Shortness of Breath      Admission history of present illness/brief narrative: 69 year-old gentleman presenting to the Emergency Dept today with multiple complaints. These include chest pain, shortness of breath, and RUQ pain. He has been previously scheduled for elective cardiac catheterization 12/29 to evaluate severe aortic insufficiency and declining LV function. However, the patient came to the emergency department today because he has noticed an acute change in his symptoms over the last 24 hours. He complains of aching all over. He also has had some nausea and vomiting. He complains of pain in his upper abdominal area. He has also had chest pain. His breathing has been worse over the last 2-3 days. He does feel that his "retaining more fluid." He admits to dietary indiscretion with salt. He has been taking his medications. Right upper quadrant ultrasound was done and there concern about acalculous cholecystitis general surgery recommended HIDA scan which was negative.    Assessment & Plan    Principal Problem:   Sepsis Active Problems:   Obese   Hypertension   Sleep apnea   Hypothyroid   Panhypopituitarism   Moderate aortic regurgitation   Diabetes   CKD (chronic kidney disease), stage IV   Acalculous cholecystitis   Abdominal pain   Acute on chronic combined systolic and diastolic CHF (congestive heart failure)   SOB (shortness of breath)   Fever   Aortic insufficiency   Chest pain: Cath without significant CAD. Severely elevated right and left heart filling pressures. 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  TCTS says high risk- medical management only  Abdominal pain, nausea, vomiting, HIDA negative.   Fever -BC x2 NGTD -vanc/zosyn- transition over to PO cipro/flagyl for a 10 days course. Now completed -CT scan abd show inflammation- tender in RUQ is gone after ABX -outpatient surgery follow up  Acute on chronic systolic/diastolic heart failure,  -LVEF 40-45 percent with diffuse hypokinesis, diastolic dysfunction, moderate to severe aortic regurgitation, severe pulmonary hypertension. Massively fluid overloaded. Lasix plus metolazone per renal- continues to diurese  Panhypopituitarism desmopressin, Decadron, Synthroid.  Seizure disorder phenytoin. Level ok on admission  Diabetes mellitus controlled  COPD -No active wheezing, on 2 L at baseline, appears stable  chronic renal failure-stage IV Per renal  Obstructive sleep apnea: Noncompliant with CPAP  Code Status: Full  Family Communication:   Disposition Plan: Remains on telemetry   Procedures  Cardiac cath  Consults   Cardiology nephrology Surgery TCTS  Medications  Scheduled Meds: . antiseptic oral rinse  7 mL Mouth Rinse BID  . aspirin EC  325 mg Oral Daily  . atorvastatin  40 mg Oral q1800  . budesonide-formoterol  2 puff Inhalation BID  . cloNIDine  0.2 mg Oral TID  . desmopressin  0.2 mg Oral BID  . dexamethasone  0.5 mg Oral Daily  . furosemide  120 mg Intravenous TID  . heparin  5,000 Units Subcutaneous 3 times per day  . insulin aspart  0-9 Units Subcutaneous TID WC  . levothyroxine  175 mcg Oral QAC breakfast  . metolazone  10 mg Oral Daily  . phenytoin  200 mg Oral Once per day on Mon Thu  . phenytoin  300 mg Oral Once per day on Sun Tue Wed Fri Sat  . sodium bicarbonate  650 mg Oral BID  . sodium chloride  3 mL Intravenous Q12H  . sodium chloride  3 mL Intravenous Q12H  . sodium chloride  3 mL Intravenous Q12H  . tiotropium  18 mcg Inhalation Daily    Continuous Infusions:   PRN Meds:.sodium chloride, sodium chloride, acetaminophen, acetaminophen, albuterol, camphor-menthol, diphenhydrAMINE, magic mouthwash, ondansetron (ZOFRAN) IV, ondansetron, sodium chloride, sodium chloride, sodium chloride  DVT Prophylaxis   Heparin -  Lab Results  Component Value Date   PLT 197 12/11/2014    Antibiotics    Anti-infectives    Start     Dose/Rate Route Frequency Ordered Stop   12/11/14 2200  ciprofloxacin (CIPRO) tablet 250 mg  Status:  Discontinued     250 mg Oral 2 times daily 12/11/14 2010 12/13/14 2111   12/11/14 2200  metroNIDAZOLE (FLAGYL) tablet 500 mg  Status:  Discontinued     500 mg Oral 3 times per day 12/11/14 2010 12/13/14 2111   12/09/14 1400  metroNIDAZOLE (FLAGYL) tablet 500 mg  Status:  Discontinued     500 mg Oral 3 times per day 12/09/14 1111 12/11/14 1509   12/09/14 1200  amoxicillin-clavulanate (AUGMENTIN) 875-125 MG per tablet 1 tablet  Status:  Discontinued     1 tablet Oral Every 12 hours 12/09/14 1111 12/09/14 1124   12/09/14 1200  ciprofloxacin (CIPRO) tablet 500 mg  Status:  Discontinued     500 mg Oral 2 times daily 12/09/14 1124 12/11/14 1509   12/08/14 0900  vancomycin (VANCOCIN) 1,750 mg in sodium chloride 0.9 % 500 mL IVPB  Status:  Discontinued     1,750 mg250 mL/hr over 120 Minutes Intravenous Every 48 hours 12/06/14 0753 12/09/14 1111   12/06/14 1500  piperacillin-tazobactam (ZOSYN) IVPB 3.375 g  Status:  Discontinued     3.375 g12.5 mL/hr over 240 Minutes Intravenous Every 8 hours 12/06/14 0748 12/09/14 1111   12/06/14 0800  piperacillin-tazobactam (ZOSYN) IVPB 3.375 g     3.375 g100 mL/hr over 30 Minutes Intravenous  Once 12/06/14 0747 12/06/14 0921   12/06/14 0800  vancomycin (VANCOCIN) 2,000 mg in sodium chloride 0.9 % 500 mL IVPB     2,000 mg250 mL/hr over 120 Minutes Intravenous  Once 12/06/14 0747 12/06/14 1211   12/06/14 0700  cefTRIAXone (ROCEPHIN) 1 g in dextrose 5 % 50 mL IVPB - Premix  Status:   Discontinued     1 g100 mL/hr over 30 Minutes Intravenous  Once 12/06/14 0658 12/06/14 0748   12/04/14 1830  cefTRIAXone (ROCEPHIN) 1 g in dextrose 5 % 50 mL IVPB - Premix  Status:  Discontinued     1 g100 mL/hr over 30 Minutes Intravenous Every 24 hours 12/04/14 1727 12/05/14 1717      Subjective: no overnight events    Objective:   Filed Vitals:   12/16/14 2100 12/16/14 2121 12/17/14 0500 12/17/14 0856  BP: 122/41  121/44   Pulse: 77  81   Temp: 97.8 F (36.6 C)  97.7 F (36.5 C)   TempSrc:      Resp: 16  16   Height:  Weight:   122.97 kg (271 lb 1.6 oz)   SpO2: 98% 100% 100% 99%    Wt Readings from Last 3 Encounters:  12/17/14 122.97 kg (271 lb 1.6 oz)  11/16/14 127.007 kg (280 lb)  06/12/14 121.201 kg (267 lb 3.2 oz)     Intake/Output Summary (Last 24 hours) at 12/17/14 1302 Last data filed at 12/17/14 0500  Gross per 24 hour  Intake      0 ml  Output   4050 ml  Net  -4050 ml     Physical Exam  Awake in chair Answers questions and follows commands. Symmetrical Chest wall movement, Good air movement bilaterally, CTAB RRR,No Gallops,Rubs or new Murmurs, No Parasternal Heave +ve B.Sounds, obese +2edema Flat affect   Data Review   Micro Results Recent Results (from the past 240 hour(s))  Clostridium Difficile by PCR     Status: None   Collection Time: 12/10/14  1:19 AM  Result Value Ref Range Status   C difficile by pcr NEGATIVE NEGATIVE Final    Radiology Reports No results found.  CBC  Recent Labs Lab 12/11/14 0347  WBC 7.0  HGB 11.6*  HCT 38.0*  PLT 197  MCV 87.2  MCH 26.6  MCHC 30.5  RDW 17.2*    Chemistries   Recent Labs Lab 12/13/14 0413 12/14/14 0630 12/15/14 0320 12/16/14 0345 12/17/14 0415  NA 137 136 137 135 138  K 4.5 4.7 4.8 4.6 4.6  CL 105 105 106 104 102  CO2 22 21 22 23 27   GLUCOSE 122* 100* 95 102* 107*  BUN 74* 73* 68* 64* 63*  CREATININE 2.76* 2.79* 2.74* 2.72* 2.74*  CALCIUM 8.5 8.5 8.6 8.3* 8.7    ------------------------------------------------------------------------------------------------------------------ estimated creatinine clearance is 34.5 mL/min (by C-G formula based on Cr of 2.74). ------------------------------------------------------------------------------------------------------------------ No results for input(s): HGBA1C in the last 72 hours. ------------------------------------------------------------------------------------------------------------------ No results for input(s): CHOL, HDL, LDLCALC, TRIG, CHOLHDL, LDLDIRECT in the last 72 hours. ------------------------------------------------------------------------------------------------------------------ No results for input(s): TSH, T4TOTAL, T3FREE, THYROIDAB in the last 72 hours.  Invalid input(s): FREET3 ------------------------------------------------------------------------------------------------------------------ No results for input(s): VITAMINB12, FOLATE, FERRITIN, TIBC, IRON, RETICCTPCT in the last 72 hours.  Recent Labs  12/16/14 0729 12/16/14 1139 12/16/14 1622 12/16/14 2121 12/17/14 0737 12/17/14 1155  GLUCAP 92 102* 83 105* 96 99    Coagulation profile  Recent Labs Lab 12/12/14 0400  INR 1.50*    No results for input(s): DDIMER in the last 72 hours.  Cardiac Enzymes No results for input(s): CKMB, TROPONINI, MYOGLOBIN in the last 168 hours.  Invalid input(s): CK ------------------------------------------------------------------------------------------------------------------ Invalid input(s): POCBNP     Time Spent in minutes   15 minutes   Eliseo Squires, Lonia Roane MD on 12/17/2014 at 1:02 PM  [email or web address] - password [email or web address] [phone]  [facility]

## 2014-12-17 NOTE — Progress Notes (Signed)
Patient Name: [name] [name] Date of Encounter: 12/17/2014  Principal Problem:   Sepsis Active Problems:   Obese   Hypertension   Sleep apnea   Hypothyroid   Panhypopituitarism   Moderate aortic regurgitation   Diabetes   CKD (chronic kidney disease), stage IV   Acalculous cholecystitis   Abdominal pain   Acute on chronic combined systolic and diastolic CHF (congestive heart failure)   SOB (shortness of breath)   Fever   Aortic insufficiency   Length of Stay: 13  SUBJECTIVE  The patient states that he feels better and almost at his baseline, still some fluid overloaded on exam. He was walking yesterday and states it was ok.    CURRENT MEDS:   . antiseptic oral rinse  7 mL Mouth Rinse BID  . aspirin EC  325 mg Oral Daily  . atorvastatin  40 mg Oral q1800  . budesonide-formoterol  2 puff Inhalation BID  . cloNIDine  0.2 mg Oral TID  . desmopressin  0.2 mg Oral BID  . dexamethasone  0.5 mg Oral Daily  . furosemide  120 mg Intravenous TID  . heparin  5,000 Units Subcutaneous 3 times per day  . insulin aspart  0-9 Units Subcutaneous TID WC  . levothyroxine  175 mcg Oral QAC breakfast  . metolazone  10 mg Oral Daily  . phenytoin  200 mg Oral Once per day on Mon Thu  . phenytoin  300 mg Oral Once per day on Sun Tue Wed Fri Sat  . sodium bicarbonate  650 mg Oral BID  . sodium chloride  3 mL Intravenous Q12H  . sodium chloride  3 mL Intravenous Q12H  . sodium chloride  3 mL Intravenous Q12H  . tiotropium  18 mcg Inhalation Daily   OBJECTIVE  Filed Vitals:   12/16/14 1430 12/16/14 2100 12/16/14 2121 12/17/14 0500  BP: 130/47 122/41  121/44  Pulse: 78 77  81  Temp: 97.6 F (36.4 C) 97.8 F (36.6 C)  97.7 F (36.5 C)  TempSrc: Oral     Resp: 18 16  16   Height:      Weight:    271 lb 1.6 oz (122.97 kg)  SpO2: 97% 98% 100% 100%    Intake/Output Summary (Last 24 hours) at 12/17/14 0854 Last data filed at 12/17/14 0500  Gross per 24 hour  Intake      3 ml    Output   4550 ml  Net  -4547 ml   Filed Weights   12/15/14 0650 12/16/14 0500 12/17/14 0500  Weight: 276 lb 8 oz (125.42 kg) 275 lb 8 oz (124.966 kg) 271 lb 1.6 oz (122.97 kg)   PHYSICAL EXAM  General: slow, NAD. Neuro: Alert and oriented X 3. Moves all extremities spontaneously. Psych: Normal affect. HEENT:  Normal  Neck: Supple without bruits or JVD. Lungs:  Resp regular and unlabored, rales at the basis Heart: RRR no s3, s4, or murmurs. Abdomen: Soft, non-tender, non-distended, BS + x 4.  Extremities: No clubbing, cyanosis, skin changes of chronic venous stasis, 1+ LE edema. DP/PT/Radials 2+ and equal bilaterally.  Accessory Clinical Findings  Recent Labs  12/16/14 0345 12/17/14 0415  NA 135 138  K 4.6 4.6  CL 104 102  CO2 23 27  GLUCOSE 102* 107*  BUN 64* 63*  CREATININE 2.72* 2.74*  CALCIUM 8.3* 8.7  PHOS 3.5 4.3   Liver Function Tests  Recent Labs  12/16/14 0345 12/17/14 0415  ALBUMIN 3.3* 3.2*  TELE: SR    ASSESSMENT AND PLAN  1  Aortic insufficiency  Patient is a high surgical risk with severe restrictive pulmonary disease, Dr Cyndia Bent doesn't feel he is a good surgical candidate. We will continue aggressive medical management with diuresis, significant weight loss and negative fluid balance 4.5 L in the last 24 hours, improved symptoms, we can switch diuretics to PO.      2  Acute on chronic systolic CHF  Due to aortic insufficiency, the patient is getting to his baseline, Crea is stable, he might be switched to Oral lasix 120 mg po TID.   3.  CKD stage 4, Crea stable    Signed, Dorothy Spark MD, [facility] 12/17/2014

## 2014-12-17 NOTE — Progress Notes (Addendum)
Patient ID: [name] [name], male   DOB: [birth year], 69 y.o.   MRN: [id number]  [facility] Progress Note    Assessment/ Plan:   1. Acute renal failure on chronic kidney disease stage IV in a patient with a solitary kidney: Appears to have stable renal function after contrast exposure and initiation of diuretic therapy. He remains volume overloaded but had an excellent response to addition of metolazone yesterday. Would continue current dose of furosemide 120 mg IV 3 times a day with metolazone for at least another 24 hours and then consider switch to decreased frequency versus oral furosemide. ? concomitant LE cellulitis 2. Acute exacerbation of congestive heart failure- Right heart catheterization showed moderate to severe aortic regurgitation and severe pulmonary hypertension/volume overload. Seen by Dr. Cyndia Bent of cardiothoracic surgery and found to be of exceedingly high surgical risk-medical management recommended 3. Panhypopituitarism: Ongoing replacement therapy 4. Morbid obesity-possibly with an element of OSA but refuses CPAP 5. Anemia of chronic disease: Hemoglobin acceptable but he has evidence of iron deficiency-low iron saturation/ferritin. Given intravenous iron 2 doses-then discontinued because of skin rash.  Subjective:   Reports to be feeling somewhat better-impressed by increased urine output    Objective:   BP 121/44 mmHg  Pulse 81  Temp(Src) 97.7 F (36.5 C) (Oral)  Resp 16  Ht 6\' 1"  (1.854 m)  Wt 122.97 kg (271 lb 1.6 oz)  BMI 35.78 kg/m2  SpO2 99%  Intake/Output Summary (Last 24 hours) at 12/17/14 1001 Last data filed at 12/17/14 0500  Gross per 24 hour  Intake      0 ml  Output   4550 ml  Net  -4550 ml   Weight change: -1.996 kg (-4 lb 6.4 oz)  Physical Exam: Gen: Comfortably resting in recliner CVS: Pulse regular in rate and rhythm Resp: Decreased breath sounds over bases Abd: Soft, obese, nontender Ext: 2-3+ lower extremity  edema---impressive erythema and warm to touch  Imaging: No results found.  Labs: BMET  Recent Labs Lab 12/11/14 0347 12/12/14 0400 12/13/14 0413 12/14/14 0630 12/15/14 0320 12/16/14 0345 12/17/14 0415  NA 135 136 137 136 137 135 138  K 5.1 4.9 4.5 4.7 4.8 4.6 4.6  CL 106 105 105 105 106 104 102  CO2 19 17* 22 21 22 23 27   GLUCOSE 97 82 122* 100* 95 102* 107*  BUN 68* 74* 74* 73* 68* 64* 63*  CREATININE 2.69* 2.68* 2.76* 2.79* 2.74* 2.72* 2.74*  CALCIUM 8.7 8.7 8.5 8.5 8.6 8.3* 8.7  PHOS 4.2 4.9* 4.7* 4.1 4.1 3.5 4.3   CBC  Recent Labs Lab 12/11/14 0347  WBC 7.0  HGB 11.6*  HCT 38.0*  MCV 87.2  PLT 197    Medications:    . antiseptic oral rinse  7 mL Mouth Rinse BID  . aspirin EC  325 mg Oral Daily  . atorvastatin  40 mg Oral q1800  . budesonide-formoterol  2 puff Inhalation BID  . cloNIDine  0.2 mg Oral TID  . desmopressin  0.2 mg Oral BID  . dexamethasone  0.5 mg Oral Daily  . furosemide  120 mg Intravenous TID  . heparin  5,000 Units Subcutaneous 3 times per day  . insulin aspart  0-9 Units Subcutaneous TID WC  . levothyroxine  175 mcg Oral QAC breakfast  . metolazone  10 mg Oral Daily  . phenytoin  200 mg Oral Once per day on Mon Thu  . phenytoin  300 mg Oral Once per day on  Nancy Fetter Tue Wed Fri Sat  . sodium bicarbonate  650 mg Oral BID  . sodium chloride  3 mL Intravenous Q12H  . sodium chloride  3 mL Intravenous Q12H  . sodium chloride  3 mL Intravenous Q12H  . tiotropium  18 mcg Inhalation Daily   Elmarie Shiley, MD 12/17/2014, 10:01 AM

## 2014-12-17 NOTE — Progress Notes (Signed)
Pt ambulated 100 ft with one assist and with pushing a rolling walker on 2L of O2Pt completed the walk without complaints and tolerated it well. Pt now resting and will continue to monitor.   Albertina Senegal E

## 2014-12-18 LAB — GLUCOSE, CAPILLARY
GLUCOSE-CAPILLARY: 87 mg/dL (ref 70–99)
GLUCOSE-CAPILLARY: 92 mg/dL (ref 70–99)
Glucose-Capillary: 96 mg/dL (ref 70–99)
Glucose-Capillary: 104 mg/dL — ABNORMAL HIGH (ref 70–99)

## 2014-12-18 LAB — RENAL FUNCTION PANEL
Albumin: 3.3 g/dL — ABNORMAL LOW (ref 3.5–5.2)
Anion gap: 9 (ref 5–15)
BUN: 69 mg/dL — AB (ref 6–23)
CALCIUM: 8.7 mg/dL (ref 8.4–10.5)
CO2: 26 mmol/L (ref 19–32)
Chloride: 98 mEq/L (ref 96–112)
Creatinine, Ser: 2.91 mg/dL — ABNORMAL HIGH (ref 0.50–1.35)
GFR calc Af Amer: 24 mL/min — ABNORMAL LOW (ref >90)
GFR calc non Af Amer: 20 mL/min — ABNORMAL LOW (ref >90)
GLUCOSE: 150 mg/dL — AB (ref 70–99)
POTASSIUM: 4.4 mmol/L (ref 3.5–5.1)
Phosphorus: 4.8 mg/dL — ABNORMAL HIGH (ref 2.3–4.6)
Sodium: 133 mmol/L — ABNORMAL LOW (ref 135–145)

## 2014-12-18 MED ORDER — FUROSEMIDE 80 MG PO TABS
120.0000 mg | ORAL_TABLET | Freq: Three times a day (TID) | ORAL | Status: DC
  Administered 2014-12-18 – 2014-12-19 (×4): 120 mg via ORAL
  Filled 2014-12-18 (×6): qty 1

## 2014-12-18 NOTE — Progress Notes (Addendum)
SUBJECTIVE:  No complaints this am  OBJECTIVE:   Vitals:   Filed Vitals:   12/17/14 2055 12/17/14 2141 12/17/14 2142 12/18/14 0633  BP:  118/35 132/39 120/40  Pulse:  74  78  Temp:  98.2 F (36.8 C)  98.5 F (36.9 C)  TempSrc:  Oral    Resp:  20  22  Height:      Weight:    262 lb 6.4 oz (119.024 kg)  SpO2: 99% 99%  95%   I&O's:   Intake/Output Summary (Last 24 hours) at 12/18/14 0827 Last data filed at 12/18/14 0600  Gross per 24 hour  Intake    666 ml  Output   4450 ml  Net  -3784 ml   TELEMETRY: Reviewed telemetry pt in NSR:     PHYSICAL EXAM General: Well developed, well nourished, in no acute distress Head: Eyes PERRLA, No xanthomas.   Normal cephalic and atramatic  Lungs:   Clear bilaterally to auscultation and percussion. Heart:   HRRR S1 S2 Pulses are 2+ & equal. Abdomen: Bowel sounds are positive, abdomen soft and non-tender without masses Extremities:   1+ edema with chronic venous stasis changes Neuro: Alert and oriented X 3. Psych:  Good affect, responds appropriately   LABS: Basic Metabolic Panel:  Recent Labs  12/17/14 0415 12/18/14 0358  NA 138 133*  K 4.6 4.4  CL 102 98  CO2 27 26  GLUCOSE 107* 150*  BUN 63* 69*  CREATININE 2.74* 2.91*  CALCIUM 8.7 8.7  PHOS 4.3 4.8*   Liver Function Tests:  Recent Labs  12/17/14 0415 12/18/14 0358  ALBUMIN 3.2* 3.3*   No results for input(s): LIPASE, AMYLASE in the last 72 hours. CBC: No results for input(s): WBC, NEUTROABS, HGB, HCT, MCV, PLT in the last 72 hours. Cardiac Enzymes: No results for input(s): CKTOTAL, CKMB, CKMBINDEX, TROPONINI in the last 72 hours. BNP: Invalid input(s): POCBNP D-Dimer: No results for input(s): DDIMER in the last 72 hours. Hemoglobin A1C: No results for input(s): HGBA1C in the last 72 hours. Fasting Lipid Panel: No results for input(s): CHOL, HDL, LDLCALC, TRIG, CHOLHDL, LDLDIRECT in the last 72 hours. Thyroid Function Tests: No results for input(s):  TSH, T4TOTAL, T3FREE, THYROIDAB in the last 72 hours.  Invalid input(s): FREET3 Anemia Panel: No results for input(s): VITAMINB12, FOLATE, FERRITIN, TIBC, IRON, RETICCTPCT in the last 72 hours. Coag Panel:   Lab Results  Component Value Date   INR 1.50* 12/12/2014   INR 1.36 11/21/2014    RADIOLOGY: Ct Abdomen Pelvis Wo Contrast  12/06/2014   CLINICAL DATA:  Right upper quadrant pain and fever with fluid retention that has worsened the last 3 days.  EXAM: CT ABDOMEN AND PELVIS WITHOUT CONTRAST  TECHNIQUE: Multidetector CT imaging of the abdomen and pelvis was performed following the standard protocol without IV contrast.  COMPARISON:  August 08, 2014  FINDINGS: The gallbladder is distended with indistinct borders suggesting possible inflammation. No gallstones are noted. There is minimal ascites in the bilateral pericolic gutter and in the upper pelvis.  The liver, spleen, pancreas and adrenal glands are normal. The patient is status post prior right nephrectomy. There are several small lesions in the left kidney some are high density in the medial lower pole unchanged compared to prior CT. Evaluation is limited without contrast. There is no small bowel obstruction or diverticulitis. There is a small hiatal hernia.  Partial decompressed bladder is unremarkable. There is no pelvic lymphadenopathy. There is a small right  pleural effusion with adjacent compression atelectasis of the posterior right lung base. Degenerative joint changes of the spine are identified.  IMPRESSION: The gallbladder is distended with indistinct borders suggesting possible inflammation. No gallstones are noted. Differential possibility includes acalculus cholecystitis.   Electronically Signed   By: Abelardo Diesel M.D.   On: 12/06/2014 14:32   Ct Head Wo Contrast  12/08/2014   CLINICAL DATA:  Initial evaluation for right-sided facial numbness.  EXAM: CT HEAD WITHOUT CONTRAST  TECHNIQUE: Contiguous axial images were obtained  from the base of the skull through the vertex without intravenous contrast.  COMPARISON:  Prior MRI from 05/27/2010.  FINDINGS: Encephalomalacia within the anterior inferior left frontal lobe most likely postsurgical in nature. Dural thickening with deformity of the calvarium also likely related to prior surgery. Prominent vascular calcifications present within the carotid siphons and distal vertebral arteries.  Diffuse prominence of the CSF containing spaces compatible with generalized atrophy. No acute large vessel territory infarct. No mass lesion or midline shift. No hydrocephalus. No extra-axial fluid collection.  Scalp soft tissues within normal limits. No acute abnormality seen about the orbits.  Paranasal sinuses are clear.  No mastoid effusion.  IMPRESSION: 1. No acute intracranial process. 2. 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   Electronically Signed   By: Jeannine Boga M.D.   On: 12/08/2014 23:58   Nm Hepato W/eject Fract  12/05/2014   CLINICAL DATA:  69 year old with nausea and vomiting  EXAM: NUCLEAR MEDICINE HEPATOBILIARY IMAGING WITH GALLBLADDER EF  TECHNIQUE: Sequential images of the abdomen were obtained out to 60 minutes following intravenous administration of radiopharmaceutical. After slow intravenous infusion of 2.51 mcg micrograms Cholecystokinin, gallbladder ejection fraction was determined.  RADIOPHARMACEUTICALS:  5.0 Millicurie [id number] Choletec  COMPARISON:  None.  FINDINGS: There is normal excretion radiotracer into the gallbladder and small bowel.  At 30 min, normal ejection fraction is greater than 30% (actually ejection fraction was 60%).  The patient did not experience symptoms during CCK infusion.  IMPRESSION: No evidence of acute cholecystitis.   Electronically Signed   By: Rosemarie Ax   On: 12/05/2014 15:37   Dg Chest Port 1 View  12/06/2014   CLINICAL  DATA:  New onset fever today. History of CHF, diabetes, hypertension, COPD, lower leg cellulitis, sleep apnea, kidney disease, and seizures.  EXAM: PORTABLE CHEST - 1 VIEW  COMPARISON:  12/04/2014  FINDINGS: Enlargement of the cardiac silhouette is unchanged. Prominence of the superior mediastinum is likely secondary to technique. Vascular congestion and interstitial opacities have slightly increased compared to the prior study. There may be small bilateral pleural effusions. No segmental airspace consolidation or pneumothorax is seen.  IMPRESSION: Slightly increased pulmonary edema.   Electronically Signed   By: Logan Bores   On: 12/06/2014 13:09   Dg Chest Portable 1 View  12/04/2014   CLINICAL DATA:  Shortness of breath.  Chest pain.  EXAM: PORTABLE CHEST - 1 VIEW  COMPARISON:  06/11/2014  FINDINGS: Stable cardiomegaly. Vascular congestion and mild interstitial edema, similar to prior exam. There is blunting of both costophrenic angles, likely small effusions. No definite confluent airspace disease. No pneumothorax.  IMPRESSION: Findings consistent with mild CHF.   Electronically Signed   By: Jeb Levering M.D.   On: 12/04/2014 10:45   US Abdomen Limited Ruq  12/04/2014   CLINICAL DATA:  Abdominal pain for 2 days.  Prior right nephrectomy.  EXAM: US ABDOMEN LIMITED -  RIGHT UPPER QUADRANT  COMPARISON:  08/08/2014 CT abdomen and pelvis  FINDINGS: Gallbladder:  Hydropic gallbladder measuring approximately 6 cm in diameter and 12 cm in length. There may be a small amount of pericholecystic fluid. Gallbladder wall is not thickened, measuring 2 mm. There is report of a positive sonographic Murphy's sign, although the patient complained of pain over the gallbladder and pancreas.  Common bile duct:  Diameter: 4.5 mm  Liver:  Mildly enlarged, with a length of 17.2 cm. No focal lesion identified. Within normal limits in parenchymal echogenicity.  Limited imaging of the pancreas demonstrated a diffusely  heterogeneous and hypoechoic echotexture. Small right pleural effusion was noted.  IMPRESSION: 1. Hydropic gallbladder. No gallstones or gallbladder wall thickening, however possible positive sonographic Murphy's sign and trace pericholecystic fluid are present and acalculous cholecystitis is not excluded. Nuclear medicine HIDA scan could be performed to evaluate for cystic duct obstruction as clinically indicated. 2. No biliary dilatation. 3. Heterogeneous, hypoechoic pancreas which may reflect pancreatitis. Correlation with serum lipase level recommended. 4. Small right pleural effusion.   Electronically Signed   By: Logan Bores   On: 12/04/2014 12:02   ASSESSMENT AND PLAN  1 Aortic insufficiency Patient is a high surgical risk with severe restrictive pulmonary disease, Dr Cyndia Bent doesn't feel he is a good surgical candidate. We will continue aggressive medical management with diuresis, significant weight loss and negative fluid balance 3.7 L in the last 24 hours, improved symptoms, Switch diuretics to PO.   2 Acute on chronic systolic CHF Due to aortic insufficiency, the patient is getting to his baseline, Crea bumped today so will change to PO Lasix today and follow creatinine closely  3. CKD stage 4, Crea stable but slightly bumped today  4.  HTN - controlled.  Continue Clonidine    Sueanne Margarita, MD  12/18/2014  8:27 AM

## 2014-12-18 NOTE — Progress Notes (Signed)
[facility] ROUNDING NOTE   Subjective:   Interval History  No complaints  Objective:  Vital signs in last 24 hours:  Temp:  [98.2 F (36.8 C)-98.6 F (37 C)] 98.5 F (36.9 C) (01/11 7096) Pulse Rate:  [74-78] 78 (01/11 0633) Resp:  [18-22] 22 (01/11 0633) BP: (118-132)/(35-40) 120/40 mmHg (01/11 0633) SpO2:  [95 %-99 %] 95 % (01/11 0633) Weight:  [119.024 kg (262 lb 6.4 oz)] 119.024 kg (262 lb 6.4 oz) (01/11 2836)  Weight change: -3.946 kg (-8 lb 11.2 oz) Filed Weights   12/16/14 0500 12/17/14 0500 12/18/14 0633  Weight: 124.966 kg (275 lb 8 oz) 122.97 kg (271 lb 1.6 oz) 119.024 kg (262 lb 6.4 oz)    Intake/Output: I/O last 3 completed shifts: In: 666 [P.O.:480; IV Piggyback:186] Out: 7100 [Urine:7100]   Intake/Output this shift:  Total I/O In: 240 [P.O.:240] Out: 375 [Urine:375]  CVS- RRR RS- CTA ABD- BS present soft non-distended EXT- 2 + edema   Basic Metabolic Panel:  Recent Labs Lab 12/14/14 0630 12/15/14 0320 12/16/14 0345 12/17/14 0415 12/18/14 0358  NA 136 137 135 138 133*  K 4.7 4.8 4.6 4.6 4.4  CL 105 106 104 102 98  CO2 21 22 23 27 26   GLUCOSE 100* 95 102* 107* 150*  BUN 73* 68* 64* 63* 69*  CREATININE 2.79* 2.74* 2.72* 2.74* 2.91*  CALCIUM 8.5 8.6 8.3* 8.7 8.7  PHOS 4.1 4.1 3.5 4.3 4.8*    Liver Function Tests:  Recent Labs Lab 12/14/14 0630 12/15/14 0320 12/16/14 0345 12/17/14 0415 12/18/14 0358  ALBUMIN 3.3* 3.5 3.3* 3.2* 3.3*   No results for input(s): LIPASE, AMYLASE in the last 168 hours. No results for input(s): AMMONIA in the last 168 hours.  CBC: No results for input(s): WBC, NEUTROABS, HGB, HCT, MCV, PLT in the last 168 hours.  Cardiac Enzymes: No results for input(s): CKTOTAL, CKMB, CKMBINDEX, TROPONINI in the last 168 hours.  BNP: Invalid input(s): POCBNP  CBG:  Recent Labs Lab 12/17/14 1155 12/17/14 1652 12/17/14 2006 12/18/14 0730 12/18/14 1207  GLUCAP 99 143* 132* 87 92     Microbiology: Results for orders placed or performed during the hospital encounter of 12/04/14  MRSA PCR Screening     Status: None   Collection Time: 12/04/14  5:44 PM  Result Value Ref Range Status   MRSA by PCR NEGATIVE NEGATIVE Final    Comment:        The GeneXpert MRSA Assay (FDA approved for NASAL specimens only), is one component of a comprehensive MRSA colonization surveillance program. It is not intended to diagnose MRSA infection nor to guide or monitor treatment for MRSA infections.   Culture, blood (routine x 2)     Status: None   Collection Time: 12/06/14  8:45 AM  Result Value Ref Range Status   Specimen Description BLOOD LEFT ARM  Final   Special Requests BOTTLES DRAWN AEROBIC AND ANAEROBIC 10CC  Final   Culture   Final    NO GROWTH 5 DAYS Note: Culture results may be compromised due to an inadequate volume of blood received in culture bottles. Performed at [facility]    Report Status 12/12/2014 FINAL  Final  Culture, blood (routine x 2)     Status: None   Collection Time: 12/06/14  8:50 AM  Result Value Ref Range Status   Specimen Description BLOOD RIGHT HAND  Final   Special Requests BOTTLES DRAWN AEROBIC ONLY 10CC  Final   Culture  Final    NO GROWTH 5 DAYS Note: Culture results may be compromised due to an excessive volume of blood received in culture bottles. Performed at [facility]    Report Status 12/12/2014 FINAL  Final  Urine culture     Status: None   Collection Time: 12/06/14 10:30 PM  Result Value Ref Range Status   Specimen Description URINE, CLEAN CATCH  Final   Special Requests Normal  Final   Colony Count NO GROWTH Performed at [facility]   Final   Culture NO GROWTH Performed at [facility]   Final   Report Status 12/10/2014 FINAL  Final  Clostridium Difficile by PCR     Status: None   Collection Time: 12/06/14 10:42 PM  Result Value Ref Range Status   C difficile by pcr NEGATIVE  NEGATIVE Final  Clostridium Difficile by PCR     Status: None   Collection Time: 12/10/14  1:19 AM  Result Value Ref Range Status   C difficile by pcr NEGATIVE NEGATIVE Final    Coagulation Studies: No results for input(s): LABPROT, INR in the last 72 hours.  Urinalysis: No results for input(s): COLORURINE, LABSPEC, PHURINE, GLUCOSEU, HGBUR, BILIRUBINUR, KETONESUR, PROTEINUR, UROBILINOGEN, NITRITE, LEUKOCYTESUR in the last 72 hours.  Invalid input(s): APPERANCEUR    Imaging: No results found.   Medications:     . antiseptic oral rinse  7 mL Mouth Rinse BID  . aspirin EC  325 mg Oral Daily  . atorvastatin  40 mg Oral q1800  . budesonide-formoterol  2 puff Inhalation BID  . cloNIDine  0.2 mg Oral TID  . desmopressin  0.2 mg Oral BID  . dexamethasone  0.5 mg Oral Daily  . furosemide  120 mg Oral TID WC  . heparin  5,000 Units Subcutaneous 3 times per day  . insulin aspart  0-9 Units Subcutaneous TID WC  . levothyroxine  175 mcg Oral QAC breakfast  . metolazone  10 mg Oral Daily  . phenytoin  200 mg Oral Once per day on Mon Thu  . phenytoin  300 mg Oral Once per day on Sun Tue Wed Fri Sat  . sodium bicarbonate  650 mg Oral BID  . tiotropium  18 mcg Inhalation Daily   acetaminophen, acetaminophen, albuterol, camphor-menthol, diphenhydrAMINE, magic mouthwash, ondansetron (ZOFRAN) IV, ondansetron  Assessment/ Plan:  1. Acute renal failure on chronic kidney disease stage IV in a patient with a solitary kidney: Appears to have stable renal function after contrast exposure and initiation of diuretic therapy.  2. Acute exacerbation of congestive heart failure- Right heart catheterization showed moderate to severe aortic regurgitation and severe pulmonary hypertension/volume overload. Seen by Dr. Cyndia Bent of cardiothoracic surgery and found to be of exceedingly high surgical risk-medical management recommended 3. Panhypopituitarism: Ongoing replacement therapy 4. Morbid  obesity-possibly with an element of OSA but refuses CPAP 5. Anemia of chronic disease: Hemoglobin acceptable but he has evidence of iron deficiency-low iron saturation/ferritin. Given intravenous iron 2 doses-then discontinued because of skin rash.   LOS: 49 [name] [name] W @TODAY @1 :34 PM

## 2014-12-18 NOTE — Progress Notes (Signed)
CSW (Clinical Education officer, museum) spoke with [facility] social worker and notified of potential for dc in next 24-48 hours. Facility confirmed they will have a bed available and will begin working on pt insurance authorization for potential discharge tomorrow.  [place], [place]

## 2014-12-18 NOTE — Progress Notes (Signed)
Physical Therapy Treatment Patient Details Name: [name] [name] MRN: [id number] DOB: 1945-12-15 Today's Date: 12/18/2014    History of Present Illness Patient is a 69 yo male admitted 12/04/14 with CP, SOB, fever, RUQ pain.  Patient with sepsis, CHF, CKD, cholecystitis.  PMH:  CKD, HTN, DM, severe aortic insufficiency, declining LV function, obesity, OSA.    PT Comments    Pt admitted with above diagnosis. Pt currently with functional limitations due to balance and endurance as well as strength deficits. 3/3 goals of 12/10/14 met. Goals revised today.   Pt progressing slowly due to fluid issues with fluctuations in fluid retention from day to day.  Still requiring O2 with ambulation.   Pt will benefit from skilled PT to increase their independence and safety with mobility to allow discharge to the venue listed below.    Follow Up Recommendations  SNF;Supervision/Assistance - 24 hour     Equipment Recommendations  3in1 (PT)    Recommendations for Other Services       Precautions / Restrictions Precautions Precautions: Fall Restrictions Weight Bearing Restrictions: No    Mobility  Bed Mobility               General bed mobility comments: Pt sitting in recliner at start of session; did not assess  Transfers Overall transfer level: Needs assistance Equipment used: Rolling walker (2 wheeled) Transfers: Sit to/from Stand Sit to Stand: Supervision         General transfer comment: Pt able to safely stand from recliner x1 with supervision for safety. Pt did not require any cuing.   Ambulation/Gait Ambulation/Gait assistance: Min guard;Supervision Ambulation Distance (Feet): 125 Feet Assistive device: Rolling walker (2 wheeled) Gait Pattern/deviations: Step-through pattern;Decreased stride length;Shuffle;Trunk flexed Gait velocity: Decreased Gait velocity interpretation: Below normal speed for age/gender General Gait Details: Pt able to safely ambulate with RW without  instability or loss of balance. Pt ambulating on supplemental O2.  Pt did cut walk short due to bil LE's weak per pt.     Stairs            Wheelchair Mobility    Modified Rankin (Stroke Patients Only)       Balance Overall balance assessment: Needs assistance         Standing balance support: Bilateral upper extremity supported;During functional activity Standing balance-Leahy Scale: Fair Standing balance comment: Pt requires RW for dynamic standing but can stand statically without RW.                     Cognition Arousal/Alertness: Awake/alert Behavior During Therapy: Flat affect Overall Cognitive Status: Within Functional Limits for tasks assessed                      Exercises General Exercises - Upper Extremity Shoulder Flexion: AROM;Both;10 reps;Seated General Exercises - Lower Extremity Ankle Circles/Pumps: AROM;Both;15 reps;Seated Long Arc Quad: AROM;Both;10 reps;Seated Hip Flexion/Marching: AROM;Both;10 reps;Seated    General Comments General comments (skin integrity, edema, etc.): Bil LEs very edematous, much more than last visit.  MD aware.        Pertinent Vitals/Pain Pain Assessment: No/denies pain  O2 at 2-3L with ambulation with sats >90%.      Home Living                      Prior Function            PT Goals (current goals can now be found in the care  plan section) Acute Rehab PT Goals PT Goal Formulation: With patient Time For Goal Achievement: 12/25/14 Potential to Achieve Goals: Good Progress towards PT goals: Progressing toward goals    Frequency  Min 3X/week    PT Plan Current plan remains appropriate    Co-evaluation             End of Session Equipment Utilized During Treatment: Gait belt;Oxygen Activity Tolerance: Patient limited by fatigue Patient left: in chair;with call bell/phone within reach     Time: 0638-6854 PT Time Calculation (min) (ACUTE ONLY): 15 min  Charges:  $Gait  Training: 8-22 mins                    G Codes:      WhiteGodfrey Pick 2014/12/19, 3:04 PM [employer] Acute Rehabilitation [phone] (pager)

## 2014-12-18 NOTE — Progress Notes (Signed)
Patient Demographics  [name] [name], is a 69 y.o. male, DOB - 1946/02/13, YSA:[id number]  Admit date - 12/04/2014   Admitting Physician Troy Sine, MD  Outpatient Primary MD for the patient is [facility], [place]  LOS - 14   Chief Complaint  Patient presents with  . Shortness of Breath      Admission history of present illness/brief narrative: 69 year-old gentleman presenting to the Emergency Dept today with multiple complaints. These include chest pain, shortness of breath, and RUQ pain. He has been previously scheduled for elective cardiac catheterization 12/29 to evaluate severe aortic insufficiency and declining LV function. However, the patient came to the emergency department today because he has noticed an acute change in his symptoms over the last 24 hours. He complains of aching all over. He also has had some nausea and vomiting. He complains of pain in his upper abdominal area. He has also had chest pain. His breathing has been worse over the last 2-3 days. He does feel that his "retaining more fluid." He admits to dietary indiscretion with salt. He has been taking his medications. Right upper quadrant ultrasound was done and there concern about acalculous cholecystitis general surgery recommended HIDA scan which was negative.    Assessment & Plan    Principal Problem:   Sepsis Active Problems:   Obese   Hypertension   Sleep apnea   Hypothyroid   Panhypopituitarism   Moderate aortic regurgitation   Diabetes   CKD (chronic kidney disease), stage IV   Acalculous cholecystitis   Abdominal pain   Acute on chronic combined systolic and diastolic CHF (congestive heart failure)   SOB (shortness of breath)   Fever   Aortic insufficiency   Chest pain: Cath without significant CAD. Severely elevated right and left heart filling pressures. 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  TCTS says high risk- medical management only  Abdominal pain, nausea, vomiting, HIDA negative.   Fever -BC x2 NGTD -vanc/zosyn- transition over to PO cipro/flagyl for a 10 days course. Now completed -CT scan abd show inflammation- tender in RUQ is gone after ABX -outpatient surgery follow up  Acute on chronic systolic/diastolic heart failure,  -LVEF 40-45 percent with diffuse hypokinesis, diastolic dysfunction, moderate to severe aortic regurgitation, severe pulmonary hypertension. Massively fluid overloaded. Lasix plus metolazone per renal- changed to PO 1/11  Panhypopituitarism desmopressin, Decadron, Synthroid.  Seizure disorder phenytoin. Level ok on admission  Diabetes mellitus controlled  COPD -No active wheezing, on 2 L at baseline, appears stable  chronic renal failure-stage IV Per renal  Obstructive sleep apnea: Noncompliant with CPAP  Code Status: Full  Family Communication: spoke with daughter on phone 1/10  Disposition Plan: Remains on telemetry   Procedures  Cardiac cath  Consults   Cardiology nephrology Surgery TCTS  Medications  Scheduled Meds: . antiseptic oral rinse  7 mL Mouth Rinse BID  . aspirin EC  325 mg Oral Daily  . atorvastatin  40 mg Oral q1800  . budesonide-formoterol  2 puff Inhalation BID  . cloNIDine  0.2 mg Oral TID  . desmopressin  0.2 mg Oral BID  . dexamethasone  0.5 mg Oral Daily  . furosemide  120 mg  Oral TID WC  . heparin  5,000 Units Subcutaneous 3 times per day  . insulin aspart  0-9 Units Subcutaneous TID WC  . levothyroxine  175 mcg Oral QAC breakfast  . metolazone  10 mg Oral Daily  . phenytoin  200 mg Oral Once per day on Mon Thu  . phenytoin  300 mg Oral Once per day on Sun Tue Wed Fri Sat  . sodium bicarbonate  650 mg Oral BID  . tiotropium  18 mcg Inhalation Daily   Continuous Infusions:   PRN Meds:.acetaminophen, acetaminophen, albuterol, camphor-menthol,  diphenhydrAMINE, magic mouthwash, ondansetron (ZOFRAN) IV, ondansetron  DVT Prophylaxis   Heparin -  Lab Results  Component Value Date   PLT 197 12/11/2014    Antibiotics    Anti-infectives    Start     Dose/Rate Route Frequency Ordered Stop   12/11/14 2200  ciprofloxacin (CIPRO) tablet 250 mg  Status:  Discontinued     250 mg Oral 2 times daily 12/11/14 2010 12/13/14 2111   12/11/14 2200  metroNIDAZOLE (FLAGYL) tablet 500 mg  Status:  Discontinued     500 mg Oral 3 times per day 12/11/14 2010 12/13/14 2111   12/09/14 1400  metroNIDAZOLE (FLAGYL) tablet 500 mg  Status:  Discontinued     500 mg Oral 3 times per day 12/09/14 1111 12/11/14 1509   12/09/14 1200  amoxicillin-clavulanate (AUGMENTIN) 875-125 MG per tablet 1 tablet  Status:  Discontinued     1 tablet Oral Every 12 hours 12/09/14 1111 12/09/14 1124   12/09/14 1200  ciprofloxacin (CIPRO) tablet 500 mg  Status:  Discontinued     500 mg Oral 2 times daily 12/09/14 1124 12/11/14 1509   12/08/14 0900  vancomycin (VANCOCIN) 1,750 mg in sodium chloride 0.9 % 500 mL IVPB  Status:  Discontinued     1,750 mg250 mL/hr over 120 Minutes Intravenous Every 48 hours 12/06/14 0753 12/09/14 1111   12/06/14 1500  piperacillin-tazobactam (ZOSYN) IVPB 3.375 g  Status:  Discontinued     3.375 g12.5 mL/hr over 240 Minutes Intravenous Every 8 hours 12/06/14 0748 12/09/14 1111   12/06/14 0800  piperacillin-tazobactam (ZOSYN) IVPB 3.375 g     3.375 g100 mL/hr over 30 Minutes Intravenous  Once 12/06/14 0747 12/06/14 0921   12/06/14 0800  vancomycin (VANCOCIN) 2,000 mg in sodium chloride 0.9 % 500 mL IVPB     2,000 mg250 mL/hr over 120 Minutes Intravenous  Once 12/06/14 0747 12/06/14 1211   12/06/14 0700  cefTRIAXone (ROCEPHIN) 1 g in dextrose 5 % 50 mL IVPB - Premix  Status:  Discontinued     1 g100 mL/hr over 30 Minutes Intravenous  Once 12/06/14 0658 12/06/14 0748   12/04/14 1830  cefTRIAXone (ROCEPHIN) 1 g in dextrose 5 % 50 mL IVPB - Premix   Status:  Discontinued     1 g100 mL/hr over 30 Minutes Intravenous Every 24 hours 12/04/14 1727 12/05/14 1717      Subjective: no overnight events    Objective:   Filed Vitals:   12/17/14 2055 12/17/14 2141 12/17/14 2142 12/18/14 0633  BP:  118/35 132/39 120/40  Pulse:  74  78  Temp:  98.2 F (36.8 C)  98.5 F (36.9 C)  TempSrc:  Oral    Resp:  20  22  Height:      Weight:    119.024 kg (262 lb 6.4 oz)  SpO2: 99% 99%  95%    Wt Readings from Last 3 Encounters:  12/18/14  119.024 kg (262 lb 6.4 oz)  11/16/14 127.007 kg (280 lb)  06/12/14 121.201 kg (267 lb 3.2 oz)     Intake/Output Summary (Last 24 hours) at 12/18/14 1117 Last data filed at 12/18/14 1000  Gross per 24 hour  Intake    844 ml  Output   4425 ml  Net  -3581 ml     Physical Exam  Awake in chair Answers questions and follows commands. Symmetrical Chest wall movement, Good air movement bilaterally, CTAB RRR,No Gallops,Rubs or new Murmurs, No Parasternal Heave +ve B.Sounds, obese +2edema Flat affect   Data Review   Micro Results Recent Results (from the past 240 hour(s))  Clostridium Difficile by PCR     Status: None   Collection Time: 12/10/14  1:19 AM  Result Value Ref Range Status   C difficile by pcr NEGATIVE NEGATIVE Final    Radiology Reports No results found.  CBC No results for input(s): WBC, HGB, HCT, PLT, MCV, MCH, MCHC, RDW, LYMPHSABS, MONOABS, EOSABS, BASOSABS, BANDABS in the last 168 hours.  Invalid input(s): NEUTRABS, BANDSABD  Chemistries   Recent Labs Lab 12/14/14 0630 12/15/14 0320 12/16/14 0345 12/17/14 0415 12/18/14 0358  NA 136 137 135 138 133*  K 4.7 4.8 4.6 4.6 4.4  CL 105 106 104 102 98  CO2 21 22 23 27 26   GLUCOSE 100* 95 102* 107* 150*  BUN 73* 68* 64* 63* 69*  CREATININE 2.79* 2.74* 2.72* 2.74* 2.91*  CALCIUM 8.5 8.6 8.3* 8.7 8.7    ------------------------------------------------------------------------------------------------------------------ estimated creatinine clearance is 31.9 mL/min (by C-G formula based on Cr of 2.91). ------------------------------------------------------------------------------------------------------------------ No results for input(s): HGBA1C in the last 72 hours. ------------------------------------------------------------------------------------------------------------------ No results for input(s): CHOL, HDL, LDLCALC, TRIG, CHOLHDL, LDLDIRECT in the last 72 hours. ------------------------------------------------------------------------------------------------------------------ No results for input(s): TSH, T4TOTAL, T3FREE, THYROIDAB in the last 72 hours.  Invalid input(s): FREET3 ------------------------------------------------------------------------------------------------------------------ No results for input(s): VITAMINB12, FOLATE, FERRITIN, TIBC, IRON, RETICCTPCT in the last 72 hours.  Recent Labs  12/16/14 2121 12/17/14 0737 12/17/14 1155 12/17/14 1652 12/17/14 2006 12/18/14 0730  GLUCAP 105* 96 99 143* 132* 87    Coagulation profile  Recent Labs Lab 12/12/14 0400  INR 1.50*    No results for input(s): DDIMER in the last 72 hours.  Cardiac Enzymes No results for input(s): CKMB, TROPONINI, MYOGLOBIN in the last 168 hours.  Invalid input(s): CK ------------------------------------------------------------------------------------------------------------------ Invalid input(s): POCBNP     Time Spent in minutes   15 minutes   Eliseo Squires, Lakeasha Petion MD on 12/18/2014 at 11:17 AM  [email or web address] - password [email or web address] [phone]  [facility]

## 2014-12-19 ENCOUNTER — Inpatient Hospital Stay
Admission: RE | Admit: 2014-12-19 | Discharge: 2015-01-31 | Disposition: A | Discharge status: Home / Self Care | Payer: Medicare HMO | Source: Ambulatory Visit | Attending: Internal Medicine | Admitting: Internal Medicine

## 2014-12-19 DIAGNOSIS — I509 Heart failure, unspecified: Principal | ICD-10-CM

## 2014-12-19 LAB — RENAL FUNCTION PANEL
Albumin: 3.1 g/dL — ABNORMAL LOW (ref 3.5–5.2)
Anion gap: 13 (ref 5–15)
BUN: 71 mg/dL — AB (ref 6–23)
CHLORIDE: 98 meq/L (ref 96–112)
CO2: 23 mmol/L (ref 19–32)
CREATININE: 2.67 mg/dL — AB (ref 0.50–1.35)
Calcium: 8.5 mg/dL (ref 8.4–10.5)
GFR calc Af Amer: 26 mL/min — ABNORMAL LOW (ref >90)
GFR, EST NON AFRICAN AMERICAN: 23 mL/min — AB (ref >90)
GLUCOSE: 96 mg/dL (ref 70–99)
POTASSIUM: 4.4 mmol/L (ref 3.5–5.1)
Phosphorus: 4.7 mg/dL — ABNORMAL HIGH (ref 2.3–4.6)
Sodium: 134 mmol/L — ABNORMAL LOW (ref 135–145)

## 2014-12-19 LAB — GLUCOSE, CAPILLARY
GLUCOSE-CAPILLARY: 120 mg/dL — AB (ref 70–99)
Glucose-Capillary: 112 mg/dL — ABNORMAL HIGH (ref 70–99)

## 2014-12-19 MED ORDER — ONDANSETRON HCL 4 MG PO TABS: 4.0000 mg | ORAL_TABLET | Freq: Three times a day (TID) | ORAL | Status: DC | PRN

## 2014-12-19 MED ORDER — INSULIN ASPART 100 UNIT/ML ~~LOC~~ SOLN: 0.0000 [IU] | Freq: Three times a day (TID) | SUBCUTANEOUS | Status: DC

## 2014-12-19 MED ORDER — METOLAZONE 10 MG PO TABS: 10.0000 mg | ORAL_TABLET | Freq: Every day | ORAL | Status: DC

## 2014-12-19 MED ORDER — FUROSEMIDE 40 MG PO TABS: 120.0000 mg | ORAL_TABLET | Freq: Three times a day (TID) | ORAL | Status: DC

## 2014-12-19 MED ORDER — ASPIRIN 325 MG PO TBEC: 325.0000 mg | DELAYED_RELEASE_TABLET | Freq: Every day | ORAL | Status: DC

## 2014-12-19 MED ORDER — SODIUM BICARBONATE 650 MG PO TABS: 650.0000 mg | ORAL_TABLET | Freq: Two times a day (BID) | ORAL | Status: DC

## 2014-12-19 MED ORDER — ALBUTEROL SULFATE (2.5 MG/3ML) 0.083% IN NEBU: 2.5000 mg | INHALATION_SOLUTION | RESPIRATORY_TRACT | Status: DC | PRN

## 2014-12-19 MED ORDER — CAMPHOR-MENTHOL 0.5-0.5 % EX LOTN: TOPICAL_LOTION | CUTANEOUS | Status: DC | PRN

## 2014-12-19 NOTE — Progress Notes (Signed)
[facility] ROUNDING NOTE   Subjective:   Interval History: no complaints  Objective:  Vital signs in last 24 hours:  Temp:  [98.4 F (36.9 C)-99.3 F (37.4 C)] 99.2 F (37.3 C) (01/12 0532) Pulse Rate:  [75-82] 82 (01/12 0532) Resp:  [20] 20 (01/12 0532) BP: (115-127)/(36-49) 127/39 mmHg (01/12 0851) SpO2:  [98 %-100 %] 99 % (01/12 0532) Weight:  [118.978 kg (262 lb 4.8 oz)] 118.978 kg (262 lb 4.8 oz) (01/12 0532)  Weight change: -0.045 kg (-1.6 oz) Filed Weights   12/17/14 0500 12/18/14 0633 12/19/14 0532  Weight: 122.97 kg (271 lb 1.6 oz) 119.024 kg (262 lb 6.4 oz) 118.978 kg (262 lb 4.8 oz)    Intake/Output: I/O last 3 completed shifts: In: 1182 [P.O.:1120; IV Piggyback:62] Out: 6203 [Urine:3200; Stool:1]   Intake/Output this shift:  Total I/O In: 240 [P.O.:240] Out: -   CVS- RRR RS- CTA ABD- BS present soft non-distended EXT- 2  + edema   Basic Metabolic Panel:  Recent Labs Lab 12/15/14 0320 12/16/14 0345 12/17/14 0415 12/18/14 0358 12/19/14 0437  NA 137 135 138 133* 134*  K 4.8 4.6 4.6 4.4 4.4  CL 106 104 102 98 98  CO2 22 23 27 26 23   GLUCOSE 95 102* 107* 150* 96  BUN 68* 64* 63* 69* 71*  CREATININE 2.74* 2.72* 2.74* 2.91* 2.67*  CALCIUM 8.6 8.3* 8.7 8.7 8.5  PHOS 4.1 3.5 4.3 4.8* 4.7*    Liver Function Tests:  Recent Labs Lab 12/15/14 0320 12/16/14 0345 12/17/14 0415 12/18/14 0358 12/19/14 0437  ALBUMIN 3.5 3.3* 3.2* 3.3* 3.1*   No results for input(s): LIPASE, AMYLASE in the last 168 hours. No results for input(s): AMMONIA in the last 168 hours.  CBC: No results for input(s): WBC, NEUTROABS, HGB, HCT, MCV, PLT in the last 168 hours.  Cardiac Enzymes: No results for input(s): CKTOTAL, CKMB, CKMBINDEX, TROPONINI in the last 168 hours.  BNP: Invalid input(s): POCBNP  CBG:  Recent Labs Lab 12/18/14 1207 12/18/14 1605 12/18/14 2042 12/19/14 0727 12/19/14 1127  GLUCAP 92 96 104* 120* 112*     Microbiology: Results for orders placed or performed during the hospital encounter of 12/04/14  MRSA PCR Screening     Status: None   Collection Time: 12/04/14  5:44 PM  Result Value Ref Range Status   MRSA by PCR NEGATIVE NEGATIVE Final    Comment:        The GeneXpert MRSA Assay (FDA approved for NASAL specimens only), is one component of a comprehensive MRSA colonization surveillance program. It is not intended to diagnose MRSA infection nor to guide or monitor treatment for MRSA infections.   Culture, blood (routine x 2)     Status: None   Collection Time: 12/06/14  8:45 AM  Result Value Ref Range Status   Specimen Description BLOOD LEFT ARM  Final   Special Requests BOTTLES DRAWN AEROBIC AND ANAEROBIC 10CC  Final   Culture   Final    NO GROWTH 5 DAYS Note: Culture results may be compromised due to an inadequate volume of blood received in culture bottles. Performed at [facility]    Report Status 12/12/2014 FINAL  Final  Culture, blood (routine x 2)     Status: None   Collection Time: 12/06/14  8:50 AM  Result Value Ref Range Status   Specimen Description BLOOD RIGHT HAND  Final   Special Requests BOTTLES DRAWN AEROBIC ONLY 10CC  Final   Culture  Final    NO GROWTH 5 DAYS Note: Culture results may be compromised due to an excessive volume of blood received in culture bottles. Performed at [facility]    Report Status 12/12/2014 FINAL  Final  Urine culture     Status: None   Collection Time: 12/06/14 10:30 PM  Result Value Ref Range Status   Specimen Description URINE, CLEAN CATCH  Final   Special Requests Normal  Final   Colony Count NO GROWTH Performed at [facility]   Final   Culture NO GROWTH Performed at [facility]   Final   Report Status 12/10/2014 FINAL  Final  Clostridium Difficile by PCR     Status: None   Collection Time: 12/06/14 10:42 PM  Result Value Ref Range Status   C difficile by pcr NEGATIVE  NEGATIVE Final  Clostridium Difficile by PCR     Status: None   Collection Time: 12/10/14  1:19 AM  Result Value Ref Range Status   C difficile by pcr NEGATIVE NEGATIVE Final    Coagulation Studies: No results for input(s): LABPROT, INR in the last 72 hours.  Urinalysis: No results for input(s): COLORURINE, LABSPEC, PHURINE, GLUCOSEU, HGBUR, BILIRUBINUR, KETONESUR, PROTEINUR, UROBILINOGEN, NITRITE, LEUKOCYTESUR in the last 72 hours.  Invalid input(s): APPERANCEUR    Imaging: No results found.   Medications:     . antiseptic oral rinse  7 mL Mouth Rinse BID  . aspirin EC  325 mg Oral Daily  . atorvastatin  40 mg Oral q1800  . budesonide-formoterol  2 puff Inhalation BID  . cloNIDine  0.2 mg Oral TID  . desmopressin  0.2 mg Oral BID  . dexamethasone  0.5 mg Oral Daily  . furosemide  120 mg Oral TID WC  . heparin  5,000 Units Subcutaneous 3 times per day  . insulin aspart  0-9 Units Subcutaneous TID WC  . levothyroxine  175 mcg Oral QAC breakfast  . metolazone  10 mg Oral Daily  . phenytoin  200 mg Oral Once per day on Mon Thu  . phenytoin  300 mg Oral Once per day on Sun Tue Wed Fri Sat  . sodium bicarbonate  650 mg Oral BID  . tiotropium  18 mcg Inhalation Daily   acetaminophen, acetaminophen, albuterol, camphor-menthol, diphenhydrAMINE, magic mouthwash, ondansetron (ZOFRAN) IV, ondansetron  Assessment/ Plan:  1. Acute renal failure on chronic kidney disease stage IV in a patient with a solitary kidney: Appears to have stable renal function after contrast exposure and initiation of diuretic therapy.  2. Acute exacerbation of congestive heart failure- Right heart catheterization showed moderate to severe aortic regurgitation and severe pulmonary hypertension/volume overload. Seen by Dr. Cyndia Bent of cardiothoracic surgery and found to be of exceedingly high surgical risk-medical management recommended 3. Panhypopituitarism: Ongoing replacement therapy 4. Morbid  obesity-possibly with an element of OSA but refuses CPAP 5. Anemia of chronic disease: Hemoglobin acceptable but he has evidence of iron deficiency-low iron saturation/ferritin. Given intravenous iron 2 doses-then discontinued because of skin rash.  Will sign off for now  Outpatient follow up can be arranged  At  [facility] kidney [facility]      LOS: 15 [name] [name] W @TODAY @11 :38 AM

## 2014-12-19 NOTE — Progress Notes (Signed)
SUBJECTIVE:  Denies SOB  OBJECTIVE:   Vitals:   Filed Vitals:   12/18/14 1341 12/18/14 2004 12/19/14 0532 12/19/14 0851  BP: 125/40 116/36 115/49 127/39  Pulse: 75 77 82   Temp: 98.4 F (36.9 C) 99.3 F (37.4 C) 99.2 F (37.3 C)   TempSrc:  Oral Oral   Resp: 20 20 20    Height:      Weight:   262 lb 4.8 oz (118.978 kg)   SpO2: 100% 98% 99%    I&O's:   Intake/Output Summary (Last 24 hours) at 12/19/14 1034 Last data filed at 12/19/14 0840  Gross per 24 hour  Intake    880 ml  Output    576 ml  Net    304 ml   TELEMETRY: Reviewed telemetry pt in NSR     PHYSICAL EXAM General: Well developed, well nourished, in no acute distress Head: Eyes PERRLA, No xanthomas.   Normal cephalic and atramatic  Lungs:   Clear bilaterally to auscultation and percussion. Heart:   HRRR S1 S2 Pulses are 2+ & equal. Abdomen: Bowel sounds are positive, abdomen soft and non-tender without masses Extremities:   Chronic venous stasis changes and trace edema Neuro: Alert and oriented X 3. Psych:  Good affect, responds appropriately   LABS: Basic Metabolic Panel:  Recent Labs  12/18/14 0358 12/19/14 0437  NA 133* 134*  K 4.4 4.4  CL 98 98  CO2 26 23  GLUCOSE 150* 96  BUN 69* 71*  CREATININE 2.91* 2.67*  CALCIUM 8.7 8.5  PHOS 4.8* 4.7*   Liver Function Tests:  Recent Labs  12/18/14 0358 12/19/14 0437  ALBUMIN 3.3* 3.1*   No results for input(s): LIPASE, AMYLASE in the last 72 hours. CBC: No results for input(s): WBC, NEUTROABS, HGB, HCT, MCV, PLT in the last 72 hours. Cardiac Enzymes: No results for input(s): CKTOTAL, CKMB, CKMBINDEX, TROPONINI in the last 72 hours. BNP: Invalid input(s): POCBNP D-Dimer: No results for input(s): DDIMER in the last 72 hours. Hemoglobin A1C: No results for input(s): HGBA1C in the last 72 hours. Fasting Lipid Panel: No results for input(s): CHOL, HDL, LDLCALC, TRIG, CHOLHDL, LDLDIRECT in the last 72 hours. Thyroid Function Tests: No  results for input(s): TSH, T4TOTAL, T3FREE, THYROIDAB in the last 72 hours.  Invalid input(s): FREET3 Anemia Panel: No results for input(s): VITAMINB12, FOLATE, FERRITIN, TIBC, IRON, RETICCTPCT in the last 72 hours. Coag Panel:   Lab Results  Component Value Date   INR 1.50* 12/12/2014   INR 1.36 11/21/2014    RADIOLOGY: Ct Abdomen Pelvis Wo Contrast  12/06/2014   CLINICAL DATA:  Right upper quadrant pain and fever with fluid retention that has worsened the last 3 days.  EXAM: CT ABDOMEN AND PELVIS WITHOUT CONTRAST  TECHNIQUE: Multidetector CT imaging of the abdomen and pelvis was performed following the standard protocol without IV contrast.  COMPARISON:  August 08, 2014  FINDINGS: The gallbladder is distended with indistinct borders suggesting possible inflammation. No gallstones are noted. There is minimal ascites in the bilateral pericolic gutter and in the upper pelvis.  The liver, spleen, pancreas and adrenal glands are normal. The patient is status post prior right nephrectomy. There are several small lesions in the left kidney some are high density in the medial lower pole unchanged compared to prior CT. Evaluation is limited without contrast. There is no small bowel obstruction or diverticulitis. There is a small hiatal hernia.  Partial decompressed bladder is unremarkable. There is no pelvic lymphadenopathy. There  is a small right pleural effusion with adjacent compression atelectasis of the posterior right lung base. Degenerative joint changes of the spine are identified.  IMPRESSION: The gallbladder is distended with indistinct borders suggesting possible inflammation. No gallstones are noted. Differential possibility includes acalculus cholecystitis.   Electronically Signed   By: Abelardo Diesel M.D.   On: 12/06/2014 14:32   Ct Head Wo Contrast  12/08/2014   CLINICAL DATA:  Initial evaluation for right-sided facial numbness.  EXAM: CT HEAD WITHOUT CONTRAST  TECHNIQUE: Contiguous axial  images were obtained from the base of the skull through the vertex without intravenous contrast.  COMPARISON:  Prior MRI from 05/27/2010.  FINDINGS: Encephalomalacia within the anterior inferior left frontal lobe most likely postsurgical in nature. Dural thickening with deformity of the calvarium also likely related to prior surgery. Prominent vascular calcifications present within the carotid siphons and distal vertebral arteries.  Diffuse prominence of the CSF containing spaces compatible with generalized atrophy. No acute large vessel territory infarct. No mass lesion or midline shift. No hydrocephalus. No extra-axial fluid collection.  Scalp soft tissues within normal limits. No acute abnormality seen about the orbits.  Paranasal sinuses are clear.  No mastoid effusion.  IMPRESSION: 1. No acute intracranial process. 2. 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   Electronically Signed   By: Jeannine Boga M.D.   On: 12/08/2014 23:58   Nm Hepato W/eject Fract  12/05/2014   CLINICAL DATA:  69 year old with nausea and vomiting  EXAM: NUCLEAR MEDICINE HEPATOBILIARY IMAGING WITH GALLBLADDER EF  TECHNIQUE: Sequential images of the abdomen were obtained out to 60 minutes following intravenous administration of radiopharmaceutical. After slow intravenous infusion of 2.51 mcg micrograms Cholecystokinin, gallbladder ejection fraction was determined.  RADIOPHARMACEUTICALS:  5.0 [employer] [id number] Choletec  COMPARISON:  None.  FINDINGS: There is normal excretion radiotracer into the gallbladder and small bowel.  At 30 min, normal ejection fraction is greater than 30% (actually ejection fraction was 60%).  The patient did not experience symptoms during CCK infusion.  IMPRESSION: No evidence of acute cholecystitis.   Electronically Signed   By: Rosemarie Ax   On: 12/05/2014 15:37   Dg Chest Port 1  View  12/06/2014   CLINICAL DATA:  New onset fever today. History of CHF, diabetes, hypertension, COPD, lower leg cellulitis, sleep apnea, kidney disease, and seizures.  EXAM: PORTABLE CHEST - 1 VIEW  COMPARISON:  12/04/2014  FINDINGS: Enlargement of the cardiac silhouette is unchanged. Prominence of the superior mediastinum is likely secondary to technique. Vascular congestion and interstitial opacities have slightly increased compared to the prior study. There may be small bilateral pleural effusions. No segmental airspace consolidation or pneumothorax is seen.  IMPRESSION: Slightly increased pulmonary edema.   Electronically Signed   By: Logan Bores   On: 12/06/2014 13:09   Dg Chest Portable 1 View  12/04/2014   CLINICAL DATA:  Shortness of breath.  Chest pain.  EXAM: PORTABLE CHEST - 1 VIEW  COMPARISON:  06/11/2014  FINDINGS: Stable cardiomegaly. Vascular congestion and mild interstitial edema, similar to prior exam. There is blunting of both costophrenic angles, likely small effusions. No definite confluent airspace disease. No pneumothorax.  IMPRESSION: Findings consistent with mild CHF.   Electronically Signed   By: Jeb Levering M.D.   On: 12/04/2014 10:45   US Abdomen Limited Ruq  12/04/2014   CLINICAL DATA:  Abdominal pain for 2 days.  Prior right nephrectomy.  EXAM: US ABDOMEN LIMITED - RIGHT UPPER QUADRANT  COMPARISON:  08/08/2014 CT abdomen and pelvis  FINDINGS: Gallbladder:  Hydropic gallbladder measuring approximately 6 cm in diameter and 12 cm in length. There may be a small amount of pericholecystic fluid. Gallbladder wall is not thickened, measuring 2 mm. There is report of a positive sonographic Murphy's sign, although the patient complained of pain over the gallbladder and pancreas.  Common bile duct:  Diameter: 4.5 mm  Liver:  Mildly enlarged, with a length of 17.2 cm. No focal lesion identified. Within normal limits in parenchymal echogenicity.  Limited imaging of the pancreas  demonstrated a diffusely heterogeneous and hypoechoic echotexture. Small right pleural effusion was noted.  IMPRESSION: 1. Hydropic gallbladder. No gallstones or gallbladder wall thickening, however possible positive sonographic Murphy's sign and trace pericholecystic fluid are present and acalculous cholecystitis is not excluded. Nuclear medicine HIDA scan could be performed to evaluate for cystic duct obstruction as clinically indicated. 2. No biliary dilatation. 3. Heterogeneous, hypoechoic pancreas which may reflect pancreatitis. Correlation with serum lipase level recommended. 4. Small right pleural effusion.   Electronically Signed   By: Logan Bores   On: 12/04/2014 12:02   ASSESSMENT AND PLAN  1 Aortic insufficiency Patient is a high surgical risk with severe restrictive pulmonary disease, Dr Cyndia Bent doesn't feel he is a good surgical candidate. We will continue aggressive medical management with diuresis, diuretics changed to PO yesterday.   2 Acute on chronic systolic CHF Due to aortic insufficiency, the patient is getting to his baseline, Crea bumped yesterday so changed to PO Lasix and creatinine slightly improved.  He was on Torsemide at home so will switch back to that at 60mg  BID (had been on 50 BID).  [name] net 15L neg since admit and 18lbs down from admit.  3. CKD stage 4, Crea stable   4. HTN - controlled. Continue Clonidine     Sueanne Margarita, MD  12/19/2014  10:34 AM

## 2014-12-19 NOTE — Discharge Summary (Signed)
Physician Discharge Summary  [name] [name]:[id number] DOB: 02-03-1946 DOA: 12/04/2014  PCP: [name], MD  Admit date: 12/04/2014 Discharge date: 12/19/2014  Time spent: 35 minutes  Recommendations for Outpatient Follow-up:  1. Daily weights 2. BMP weekly 3. Follow up with surgery, cardiology and nephrology 4. May need resumption of torsemide depending on weights and fluid build up 5. Cpap at night if patient will wear 6. O2 2L daily   Discharge Diagnoses:  Principal Problem:   Sepsis Active Problems:   Obese   Hypertension   Sleep apnea   Hypothyroid   Panhypopituitarism   Moderate aortic regurgitation   Diabetes   CKD (chronic kidney disease), stage IV   Acalculous cholecystitis   Abdominal pain   Acute on chronic combined systolic and diastolic CHF (congestive heart failure)   SOB (shortness of breath)   Fever   Aortic insufficiency   Discharge Condition: improved  Diet recommendation: cardiac/diabetic  Filed Weights   12/17/14 0500 12/18/14 2952 12/19/14 0532  Weight: 122.97 kg (271 lb 1.6 oz) 119.024 kg (262 lb 6.4 oz) 118.978 kg (262 lb 4.8 oz)    History of present illness:  69 year old man with complex past medical history presented with multiple complaints including chest pain, nausea, vomiting, epigastric and right upper quadrant pain. Initial evaluation suggested acute on chronic heart failure, elevated troponin, possible acalculous cholecystitis, possible acute pancreatitis. EDP d/w intermittent myalgias, diarrhea, chest discomfort, dry cough, and vomiting Dr. Pernell Dupre with cardiology "who agrees the patient should be transferred to [facility]. He dates the patient will need medical and surgical evaluation but agrees patient is a poor surgical candidate given his multiple medical problems. He was scheduled for cardiac catheterization. Also spoke with Dr. Excell Seltzer with general surgery. They will also see pt in consult and agree with  medicine admission at [facility]."  Patient is a poor historian with difficulty articulating more concrete details. Yesterday he felt poorly with generalized body aches. Today he had an episode of chest pain subsequently resolved spontaneously without radiation to the left arm neck or jaw. Nausea and some vomiting. No shortness of breath. Currently he is pain-free. He has bilateral lower extremity edema, the chronicity of which is difficult to ascertain. He also reports epigastric and right upper quadrant pain of sudden onset today. No specific aggravating or alleviating factors.  He has a history of severe aortic regurgitation and was scheduled for outpatient heart catheterization 12/29.  In the emergency department afebrile with stable vital signs. Stable hypoxia. Normal respiratory rate. Creatinine at baseline, 2.37. Troponin elevated 0.04. Hemoglobin stable 12.0. Chest x-ray suggested mild CHF. Urinalysis negative. Abdominal ultrasound showed hydropic gallbladder. Cannot exclude acalculous cholecystitis. Consider nuclear HIDA. Heterogeneous hypoechoic pancreas consider pancreatitis.   Hospital Course:  Chest pain: Cath without significant CAD. Severely elevated right and left heart filling pressures. 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 TCTS says high risk- medical management only  Abdominal pain, nausea, vomiting, HIDA negative for cholecystitis  Fever -BC x2 NGTD -vanc/zosyn- transition over to PO cipro/flagyl for a 10 days course. Now completed -CT scan abd show inflammation- tender in RUQ is gone after ABX -outpatient surgery follow up  Acute on chronic systolic/diastolic heart failure,  -LVEF 40-45 percent with diffuse hypokinesis, diastolic dysfunction, moderate to severe aortic regurgitation, severe pulmonary hypertension. Massively fluid overloaded. Lasix plus metolazone per renal- changed to PO  1/11- need continued diuresis -daily weights  Panhypopituitarism desmopressin, Decadron, Synthroid.  Seizure  disorder phenytoin. Level ok on admission  Diabetes mellitus controlled  COPD -No active wheezing, on 2 L at baseline, appears stable  chronic renal failure-stage IV Per renal  Obstructive sleep apnea: Noncompliant with CPAP  Procedures:  cath  Consultations:  Renal  Cards  surgery  Discharge Exam: Filed Vitals:   12/19/14 0851  BP: 127/39  Pulse:   Temp:   Resp:     General: A+OX3, NAD Cardiovascular: rrr Respiratory: diminshed  Discharge Instructions   Discharge Instructions    (HEART FAILURE PATIENTS) Call MD:  Anytime you have any of the following symptoms: 1) 3 pound weight gain in 24 hours or 5 pounds in 1 week 2) shortness of breath, with or without a dry hacking cough 3) swelling in the hands, feet or stomach 4) if you have to sleep on extra pillows at night in order to breathe.    Complete by:  As directed      Diet - low sodium heart healthy    Complete by:  As directed      Diet Carb Modified    Complete by:  As directed      Discharge instructions    Complete by:  As directed   BMp weekly  Daily weights     Increase activity slowly    Complete by:  As directed           Current Discharge Medication List    START taking these medications   Details  aspirin EC 325 MG EC tablet Take 1 tablet (325 mg total) by mouth daily. Qty: 30 tablet, Refills: 0    camphor-menthol (SARNA) lotion Apply topically as needed for itching. Qty: 222 mL, Refills: 0    furosemide (LASIX) 40 MG tablet Take 3 tablets (120 mg total) by mouth 3 (three) times daily with meals. Qty: 30 tablet    insulin aspart (NOVOLOG) 100 UNIT/ML injection Inject 0-9 Units into the skin 3 (three) times daily with meals. Qty: 10 mL, Refills: 11    ondansetron (ZOFRAN) 4 MG tablet Take 1 tablet (4 mg total) by mouth every 8 (eight) hours as needed for nausea or  vomiting. Qty: 20 tablet, Refills: 0    sodium bicarbonate 650 MG tablet Take 1 tablet (650 mg total) by mouth 2 (two) times daily.      CONTINUE these medications which have CHANGED   Details  albuterol (PROVENTIL) (2.5 MG/3ML) 0.083% nebulizer solution Take 3 mLs (2.5 mg total) by nebulization every 4 (four) hours as needed for wheezing or shortness of breath. Qty: 75 mL, Refills: 12    metolazone (ZAROXOLYN) 10 MG tablet Take 1 tablet (10 mg total) by mouth daily.      CONTINUE these medications which have NOT CHANGED   Details  budesonide-formoterol (SYMBICORT) 160-4.5 MCG/ACT inhaler Inhale 2 puffs into the lungs 2 (two) times daily.    cloNIDine (CATAPRES) 0.2 MG tablet Take 1 tablet (0.2 mg total) by mouth 3 (three) times daily. Qty: 90 tablet, Refills: 3    desmopressin (DDAVP) 0.2 MG tablet Take 1 tablet (0.2 mg total) by mouth 2 (two) times daily. Qty: 60 tablet, Refills: 3    dexamethasone (DECADRON) 0.5 MG tablet Take 0.5 mg by mouth daily.    ipratropium (ATROVENT) 0.02 % nebulizer solution Take 0.5 mg by nebulization 4 (four) times daily.    levothyroxine (SYNTHROID, LEVOTHROID) 175 MCG tablet Take 175 mcg by mouth daily before breakfast.    loratadine (CLARITIN) 10 MG tablet Take 10  mg by mouth daily as needed for allergies.     mometasone (NASONEX) 50 MCG/ACT nasal spray Place 2 sprays into the nose daily. Qty: 17 g, Refills: 12    omeprazole (PRILOSEC) 20 MG capsule Take 20 mg by mouth daily.    phenytoin (DILANTIN) 100 MG ER capsule Take 200-300 mg by mouth daily. Takes 200 mg on Monday and Thursdsay. On all other days of the week takes 300 mg.    simvastatin (ZOCOR) 80 MG tablet Take 80 mg by mouth daily.       STOP taking these medications     amLODipine (NORVASC) 10 MG tablet      linagliptin (TRADJENTA) 5 MG TABS tablet      torsemide (DEMADEX) 100 MG tablet      oxymetazoline (AFRIN NASAL SPRAY) 0.05 % nasal spray        No Known  Allergies Follow-up Information    Follow up with FANTA,TESFAYE, MD In 1 week.   Specialty:  Internal Medicine   Contact information:   [address] [place] [address] [phone]       Follow up with [facility], MD In 1 month.   Specialty:  Nephrology   Why:  renal doctor   Contact information:   [address] [address] [phone]       Please follow up.   Why:  cardiology 1 month      Follow up with [facility], MD In 1 month.   Specialty:  General Surgery   Contact information:   [address] [place]  [address] [phone]        The results of significant diagnostics from this hospitalization (including imaging, microbiology, ancillary and laboratory) are listed below for reference.    Significant Diagnostic Studies: Ct Abdomen Pelvis Wo Contrast  12/06/2014   CLINICAL DATA:  Right upper quadrant pain and fever with fluid retention that has worsened the last 3 days.  EXAM: CT ABDOMEN AND PELVIS WITHOUT CONTRAST  TECHNIQUE: Multidetector CT imaging of the abdomen and pelvis was performed following the standard protocol without IV contrast.  COMPARISON:  August 08, 2014  FINDINGS: The gallbladder is distended with indistinct borders suggesting possible inflammation. No gallstones are noted. There is minimal ascites in the bilateral pericolic gutter and in the upper pelvis.  The liver, spleen, pancreas and adrenal glands are normal. The patient is status post prior right nephrectomy. There are several small lesions in the left kidney some are high density in the medial lower pole unchanged compared to prior CT. Evaluation is limited without contrast. There is no small bowel obstruction or diverticulitis. There is a small hiatal hernia.  Partial decompressed bladder is unremarkable. There is no pelvic lymphadenopathy. There is a small right pleural effusion with adjacent compression atelectasis of the posterior  right lung base. Degenerative joint changes of the spine are identified.  IMPRESSION: The gallbladder is distended with indistinct borders suggesting possible inflammation. No gallstones are noted. Differential possibility includes acalculus cholecystitis.   Electronically Signed   By: Abelardo Diesel M.D.   On: 12/06/2014 14:32   Ct Head Wo Contrast  12/08/2014   CLINICAL DATA:  Initial evaluation for right-sided facial numbness.  EXAM: CT HEAD WITHOUT CONTRAST  TECHNIQUE: Contiguous axial images were obtained from the base of the skull through the vertex without intravenous contrast.  COMPARISON:  Prior MRI from 05/27/2010.  FINDINGS: Encephalomalacia within the anterior inferior left frontal lobe most likely postsurgical in  nature. Dural thickening with deformity of the calvarium also likely related to prior surgery. Prominent vascular calcifications present within the carotid siphons and distal vertebral arteries.  Diffuse prominence of the CSF containing spaces compatible with generalized atrophy. No acute large vessel territory infarct. No mass lesion or midline shift. No hydrocephalus. No extra-axial fluid collection.  Scalp soft tissues within normal limits. No acute abnormality seen about the orbits.  Paranasal sinuses are clear.  No mastoid effusion.  IMPRESSION: 1. No acute intracranial process. 2. 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   Electronically Signed   By: Jeannine Boga M.D.   On: 12/08/2014 23:58   Nm Hepato W/eject Fract  12/05/2014   CLINICAL DATA:  69 year old with nausea and vomiting  EXAM: NUCLEAR MEDICINE HEPATOBILIARY IMAGING WITH GALLBLADDER EF  TECHNIQUE: Sequential images of the abdomen were obtained out to 60 minutes following intravenous administration of radiopharmaceutical. After slow intravenous infusion of 2.51 mcg micrograms Cholecystokinin, gallbladder ejection  fraction was determined.  RADIOPHARMACEUTICALS:  5.0 [place] LO-75I Choletec  COMPARISON:  None.  FINDINGS: There is normal excretion radiotracer into the gallbladder and small bowel.  At 30 min, normal ejection fraction is greater than 30% (actually ejection fraction was 60%).  The patient did not experience symptoms during CCK infusion.  IMPRESSION: No evidence of acute cholecystitis.   Electronically Signed   By: Rosemarie Ax   On: 12/05/2014 15:37   Dg Chest Port 1 View  12/06/2014   CLINICAL DATA:  New onset fever today. History of CHF, diabetes, hypertension, COPD, lower leg cellulitis, sleep apnea, kidney disease, and seizures.  EXAM: PORTABLE CHEST - 1 VIEW  COMPARISON:  12/04/2014  FINDINGS: Enlargement of the cardiac silhouette is unchanged. Prominence of the superior mediastinum is likely secondary to technique. Vascular congestion and interstitial opacities have slightly increased compared to the prior study. There may be small bilateral pleural effusions. No segmental airspace consolidation or pneumothorax is seen.  IMPRESSION: Slightly increased pulmonary edema.   Electronically Signed   By: Logan Bores   On: 12/06/2014 13:09   Dg Chest Portable 1 View  12/04/2014   CLINICAL DATA:  Shortness of breath.  Chest pain.  EXAM: PORTABLE CHEST - 1 VIEW  COMPARISON:  06/11/2014  FINDINGS: Stable cardiomegaly. Vascular congestion and mild interstitial edema, similar to prior exam. There is blunting of both costophrenic angles, likely small effusions. No definite confluent airspace disease. No pneumothorax.  IMPRESSION: Findings consistent with mild CHF.   Electronically Signed   By: Jeb Levering M.D.   On: 12/04/2014 10:45   US Abdomen Limited Ruq  12/04/2014   CLINICAL DATA:  Abdominal pain for 2 days.  Prior right nephrectomy.  EXAM: US ABDOMEN LIMITED - RIGHT UPPER QUADRANT  COMPARISON:  08/08/2014 CT abdomen and pelvis  FINDINGS: Gallbladder:  Hydropic gallbladder measuring  approximately 6 cm in diameter and 12 cm in length. There may be a small amount of pericholecystic fluid. Gallbladder wall is not thickened, measuring 2 mm. There is report of a positive sonographic Murphy's sign, although the patient complained of pain over the gallbladder and pancreas.  Common bile duct:  Diameter: 4.5 mm  Liver:  Mildly enlarged, with a length of 17.2 cm. No focal lesion identified. Within normal limits in parenchymal echogenicity.  Limited imaging of the pancreas demonstrated a diffusely heterogeneous and hypoechoic echotexture. Small right pleural effusion was noted.  IMPRESSION: 1. Hydropic gallbladder. No gallstones or  gallbladder wall thickening, however possible positive sonographic Murphy's sign and trace pericholecystic fluid are present and acalculous cholecystitis is not excluded. Nuclear medicine HIDA scan could be performed to evaluate for cystic duct obstruction as clinically indicated. 2. No biliary dilatation. 3. Heterogeneous, hypoechoic pancreas which may reflect pancreatitis. Correlation with serum lipase level recommended. 4. Small right pleural effusion.   Electronically Signed   By: Logan Bores   On: 12/04/2014 12:02    Microbiology: Recent Results (from the past 240 hour(s))  Clostridium Difficile by PCR     Status: None   Collection Time: 12/10/14  1:19 AM  Result Value Ref Range Status   C difficile by pcr NEGATIVE NEGATIVE Final     Labs: Basic Metabolic Panel:  Recent Labs Lab 12/15/14 0320 12/16/14 0345 12/17/14 0415 12/18/14 0358 12/19/14 0437  NA 137 135 138 133* 134*  K 4.8 4.6 4.6 4.4 4.4  CL 106 104 102 98 98  CO2 22 23 27 26 23   GLUCOSE 95 102* 107* 150* 96  BUN 68* 64* 63* 69* 71*  CREATININE 2.74* 2.72* 2.74* 2.91* 2.67*  CALCIUM 8.6 8.3* 8.7 8.7 8.5  PHOS 4.1 3.5 4.3 4.8* 4.7*   Liver Function Tests:  Recent Labs Lab 12/15/14 0320 12/16/14 0345 12/17/14 0415 12/18/14 0358 12/19/14 0437  ALBUMIN 3.5 3.3* 3.2* 3.3* 3.1*    No results for input(s): LIPASE, AMYLASE in the last 168 hours. No results for input(s): AMMONIA in the last 168 hours. CBC: No results for input(s): WBC, NEUTROABS, HGB, HCT, MCV, PLT in the last 168 hours. Cardiac Enzymes: No results for input(s): CKTOTAL, CKMB, CKMBINDEX, TROPONINI in the last 168 hours. BNP: BNP (last 3 results)  Recent Labs  06/11/14 1950  PROBNP 10571.0*   CBG:  Recent Labs Lab 12/18/14 1207 12/18/14 1605 12/18/14 2042 12/19/14 0727 12/19/14 1127  GLUCAP 92 96 104* 120* 112*       Signed:  Caylan Chenard  [facility] 12/19/2014, 11:34 AM

## 2014-12-19 NOTE — Progress Notes (Signed)
Report called to [id number]).

## 2014-12-20 ENCOUNTER — Non-Acute Institutional Stay (SKILLED_NURSING_FACILITY): Payer: Medicare HMO | Admitting: Internal Medicine

## 2014-12-20 DIAGNOSIS — J441 Chronic obstructive pulmonary disease with (acute) exacerbation: Secondary | ICD-10-CM

## 2014-12-20 DIAGNOSIS — J9621 Acute and chronic respiratory failure with hypoxia: Secondary | ICD-10-CM

## 2014-12-20 DIAGNOSIS — I5041 Acute combined systolic (congestive) and diastolic (congestive) heart failure: Secondary | ICD-10-CM

## 2014-12-20 LAB — GLUCOSE, CAPILLARY
Glucose-Capillary: 115 mg/dL — ABNORMAL HIGH (ref 70–99)
Glucose-Capillary: 125 mg/dL — ABNORMAL HIGH (ref 70–99)

## 2014-12-21 LAB — GLUCOSE, CAPILLARY
GLUCOSE-CAPILLARY: 127 mg/dL — AB (ref 70–99)
Glucose-Capillary: 98 mg/dL (ref 70–99)

## 2014-12-22 LAB — GLUCOSE, CAPILLARY
GLUCOSE-CAPILLARY: 98 mg/dL (ref 70–99)
GLUCOSE-CAPILLARY: 114 mg/dL — AB (ref 70–99)
Glucose-Capillary: 115 mg/dL — ABNORMAL HIGH (ref 70–99)
Glucose-Capillary: 138 mg/dL — ABNORMAL HIGH (ref 70–99)

## 2014-12-23 LAB — GLUCOSE, CAPILLARY
Glucose-Capillary: 113 mg/dL — ABNORMAL HIGH (ref 70–99)
Glucose-Capillary: 118 mg/dL — ABNORMAL HIGH (ref 70–99)

## 2014-12-24 LAB — GLUCOSE, CAPILLARY
GLUCOSE-CAPILLARY: 125 mg/dL — AB (ref 70–99)
GLUCOSE-CAPILLARY: 135 mg/dL — AB (ref 70–99)
GLUCOSE-CAPILLARY: 152 mg/dL — AB (ref 70–99)

## 2014-12-24 NOTE — Progress Notes (Signed)
Patient ID: [name] [name], male   DOB: 12-13-1945, 69 y.o.   MRN: [id number]               HISTORY & PHYSICAL  DATE:  12/20/2014                                     FACILITY: [facility]     LEVEL OF CARE:   SNF   CHIEF COMPLAINT:  Admission to SNF, post stay at [facility], 12/04/2014 through 12/19/2014.    HISTORY OF PRESENT ILLNESS:  This is a patient who initially presented to hospital with vomiting, epigastric and right upper quadrant pain.  Initial evaluation suggested acute-on-chronic heart failure, severe chronic renal failure, possible acalculous cholecystitis and pancreatitis.  The patient has a known history of severe aortic regurgitation and was scheduled for an outpatient cardiac cath.    He underwent a cath in hospital that was without significant coronary artery disease.  He had significant right and left ventricular fluid pressures, wide aortic pulse pressure, severe aortic regurgitation likely secondary to aortic root in descending aorta, and dilatation.  He was seen by Thoracic Surgery, who felt he was high-risk and suggested medical management only.    A CT scan of the abdomen suggested acalculous cholecystitis.  He was seen by General Surgery.  A HIDA scan did not suggest acute cholecystitis.    The patient was felt to be massively fluid-overloaded.  He received high doses of Lasix and metolazone, which was changed to p.o. on 12/18/2014.     LABORATORY DATA:  Review of lab work showed a BUN and creatinine in the range of 74 and 2.79 at its maximum.  This did not fluctuate that much in the hospital.    His CBC showed a hemoglobin of 11.6.    An iron panel was done that showed an iron of 23, a TIBC of 197, both low.  Ferritin was 134.  B12 was normal in the 780s.    Albumin normal at 3.5.    I do not see that his liver function tests were elevated.    His troponin-I was negative.    BNP was in the 800 to mid 900 range.      His Dilantin level was low.      PAST MEDICAL HISTORY/PROBLEM LIST:                   Panhypopituitarism.                Seizure disorder.  On Dilantin.  His levels were low in the hospital.    Type 2 diabetes.    COPD.  On chronic oxygen.      Chronic renal failure stage IV.    Obstructive sleep apnea.  By his own admission, he is noncompliant with his CPAP.   He does have oxygen at home at 2 L.    CURRENT MEDICATIONS:  Medication list is reviewed.                   Enteric-coated aspirin 325 q.d.    Lasix 40 mg, 3 tablets/120 mg three times a day.    Came to [place] on an insulin sliding scale.  However, this is currently on hold.  I will need to check on the issue here.    Zofran 4 mg every 8 hours p.r.n.  Sarna lotion p.r.n.       Albuterol nebulizers 2.5 q.4 p.r.n.      metolazone 10 mg a day.    Symbicort 160/4.5.    Catapres 0.2 three times a day.    DDAVP 0.2 two times daily.    Dexamethasone 25 mEq daily.      Atrovent 0.5 nebulizers four times a day.     Synthroid 175 mcg daily.    Claritin 10 q.d.     Nasonex 2 sprays into the nares daily.     Prilosec 20 q.d.      Dilantin 200 mg on Mondays and Thursdays, 300 mg every other day.     His Norvasc, Tradgenta, Demadex were all stopped.   I am not exactly sure how much diuretic he was on.     SOCIAL HISTORY:         HOUSING:  The patient tells me he lives in [place].   FUNCTIONAL STATUS:  Independent with ADLs in the home.  Has a walker, wheelchair, and CPAP that he does not use.  I think he had outside IADL support.  He was not on insulin, but was on oxygen.  FAMILY HISTORY:  Reviewed.   MOTHER/FATHER:  Has a history of cancer in both parents.  Both his parents are deceased.   SIBLINGS:   History of cancer in a sister, heart failure in a sister.    REVIEW OF SYSTEMS:   GENERAL:  The patient is somewhat flat.   He does not converse that much, but seems to be able to answer questions.   CHEST/RESPIRATORY:  Not excessively  short of breath.   CARDIAC:   No clear chest pain.     GI:  No nausea, vomiting or diarrhea.  No abdominal pain.   GU:  No dysuria.      PHYSICAL EXAMINATION:   VITAL SIGNS:   O2 SATURATIONS:  99% on 2 L of oxygen.   RESPIRATIONS:  18 and unlabored.    PULSE:   82.   GENERAL APPEARANCE:  The patient looks well, considering the complexity of his medical issues.   CHEST/RESPIRATORY:  Surprisingly clear air entry bilaterally.    CARDIOVASCULAR:  CARDIAC:  There is a 2/6 pansystolic murmur at the lower left sternal border, and a diastolic decrescendo murmur compatible with his known AR.  His JVP is not elevated.   He appears to be euvolemic at the bedside.   GASTROINTESTINAL:  ABDOMEN:   Distended.  Scars in the left abdomen of uncertain etiology.   LIVER/SPLEEN/KIDNEYS:  No liver, no spleen.  No tenderness.   GENITOURINARY:  BLADDER:   Not distended.  There is no CVA tenderness.   CIRCULATION:   EDEMA/VARICOSITIES:  Extremities:  Probably some degree of venous stasis.  Signs of recent fluid loss.    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  It would appear that he was aggressively diuresed.  [name] on a very high dose of diuretics, including Lasix and metolazone.  His lab work will need to be carefully watched.  Today, his BUN is 72 and creatinine 2.8.  I will repeat this in two days' time.  His weights will need to be watched, as well as his blood pressure.   He apparently has follow-up with  Nephrology.    COPD.  On chronic oxygen.  This does not appear to be unstable.     Obstructive sleep apnea.   He has a CPAP machine at home, but does not use it.   [name] on chronic oxygen.    Severe aortic regurgitation.  Considered for cardiac surgery, although he is felt to be of too high risk.  He has a descending aortic aneurysm.  Obviously, meticulous blood pressure control will be paramount here.     Chronic renal failure stage IV.  Apparently secondary to diabetes.  His fasting blood sugar this morning was 95, however.    Abdominal pain.  Possibly acalculous cholecystitis.  However, his HIDA scan showed no cystic obstruction.  This appears to have resolved.  [name] not complaining of this currently.    The patient actually looks fairly good, considering the complexity of his hospitalization.    Follow-up lab work will be ordered.  Obviously, meticulous follow-up of his weights, blood pressure, clinical situation.    It is possible that he will need reduction of his diuretics.  He left the hospital with a BUN of 71, a creatinine of 2.67, and a potassium of 4.4.   Today this is 72, 2.8, and 4.3, respectively.      CPT CODE: 74734

## 2014-12-25 ENCOUNTER — Non-Acute Institutional Stay (SKILLED_NURSING_FACILITY): Payer: Medicare HMO | Admitting: Internal Medicine

## 2014-12-25 DIAGNOSIS — E1122 Type 2 diabetes mellitus with diabetic chronic kidney disease: Secondary | ICD-10-CM

## 2014-12-25 DIAGNOSIS — N184 Chronic kidney disease, stage 4 (severe): Secondary | ICD-10-CM

## 2014-12-25 DIAGNOSIS — J9621 Acute and chronic respiratory failure with hypoxia: Secondary | ICD-10-CM

## 2014-12-25 DIAGNOSIS — N189 Chronic kidney disease, unspecified: Secondary | ICD-10-CM

## 2014-12-25 DIAGNOSIS — I5041 Acute combined systolic (congestive) and diastolic (congestive) heart failure: Secondary | ICD-10-CM

## 2014-12-25 LAB — GLUCOSE, CAPILLARY
Glucose-Capillary: 99 mg/dL (ref 70–99)
Glucose-Capillary: 143 mg/dL — ABNORMAL HIGH (ref 70–99)

## 2014-12-26 NOTE — Progress Notes (Signed)
Patient ID: [name] [name], male   DOB: June 30, 1946, 69 y.o.   MRN: [id number]               PROGRESS NOTE  DATE:  12/25/2014                     FACILITY: [facility]      LEVEL OF CARE:   SNF   Acute Visit   CHIEF COMPLAINT:  Follow up CHF.     HISTORY OF PRESENT ILLNESS:  This is a patient whom I admitted to the facility last week.  He had a complicated hospitalization from 12/04/2014 through 12/19/2014 with nausea, vomiting, and abdominal pain felt to be secondary to acalculous cholecystitis.    He also had acute-on-chronic heart failure and severe chronic renal failure.  His discharge creatinine was 2.79.    He had severe aortic insufficiency.  However, he was deemed to be too high a surgical candidate.    LABORATORY DATA:   Lab work on 12/25/2014 showed a BUN of 89 and a creatinine of 3, a potassium of 3.8.    This morning, he is still with a BUN of 89 and a creatinine of 3.  His potassium is still 3.8.    Total CO2 is 32, at the upper level of normal.  The rest of his liver function tests are normal.    His Dilantin level on 12/25/2014 was 16.3.    REVIEW OF SYSTEMS:           GENERAL:  Weights in the facility have been stable.  He has gone from 255.1 pounds to 250.6 pounds. CHEST/RESPIRATORY:  [name] not complaining of excessive shortness of breath.   CARDIAC:   No clear chest pain.  No palpitations.  He states his edema is roughly the same.    PHYSICAL EXAMINATION:                VITAL SIGNS:   O2 SATURATIONS:  99% on 2 L.   PULSE:   71.   RESPIRATIONS:  18 and unlabored.   GENERAL APPEARANCE:  The patient appears stable.   CHEST/RESPIRATORY:  Shallow air entry bilaterally.  No crackles or wheezes.   CARDIOVASCULAR:  CARDIAC:  There is a pansystolic murmur at the lower left sternal border, as well as a diastolic decrescendo murmur compatible with his known HR.  His JVP is not elevated.  He appears to be euvolemic.   EDEMA/VARICOSITIES:  Extremities:  He has  venous stasis.  However, the edema seems less here, as well.     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  His BUN today at 89 and his creatinine at 3 is higher than when he left the hospital.  [name] on an extremely aggressive diuretic regimen which includes metolazone 10 q.d. and Lasix 120 three times a day.  I am going to follow this in two days' time.    COPD.  On chronic oxygen.  This does not appear to be unstable.    Chronic renal failure stage IV.  I am going to recheck his BUN and creatinine.  This is secondary to diabetes.  His blood sugars are generally in the mid 100 range all day long.  [name] on no diabetic medications that I can see.  CPT CODE: 57903

## 2014-12-27 LAB — GLUCOSE, CAPILLARY
Glucose-Capillary: 93 mg/dL (ref 70–99)
Glucose-Capillary: 155 mg/dL — ABNORMAL HIGH (ref 70–99)

## 2014-12-29 LAB — GLUCOSE, CAPILLARY
Glucose-Capillary: 111 mg/dL — ABNORMAL HIGH (ref 70–99)
Glucose-Capillary: 147 mg/dL — ABNORMAL HIGH (ref 70–99)

## 2015-01-01 ENCOUNTER — Non-Acute Institutional Stay (SKILLED_NURSING_FACILITY): Payer: Medicare HMO | Admitting: Internal Medicine

## 2015-01-01 DIAGNOSIS — I5041 Acute combined systolic (congestive) and diastolic (congestive) heart failure: Secondary | ICD-10-CM

## 2015-01-01 DIAGNOSIS — N184 Chronic kidney disease, stage 4 (severe): Secondary | ICD-10-CM

## 2015-01-01 LAB — GLUCOSE, CAPILLARY
Glucose-Capillary: 110 mg/dL — ABNORMAL HIGH (ref 70–99)
Glucose-Capillary: 147 mg/dL — ABNORMAL HIGH (ref 70–99)

## 2015-01-03 ENCOUNTER — Non-Acute Institutional Stay (SKILLED_NURSING_FACILITY): Payer: Medicare HMO | Admitting: Internal Medicine

## 2015-01-03 ENCOUNTER — Ambulatory Visit (INDEPENDENT_AMBULATORY_CARE_PROVIDER_SITE_OTHER): Payer: Medicare HMO | Admitting: Cardiovascular Disease

## 2015-01-03 ENCOUNTER — Encounter: Payer: Self-pay | Admitting: Cardiovascular Disease

## 2015-01-03 VITALS — BP 136/58 | HR 77 | Ht 73.0 in | Wt 251.0 lb

## 2015-01-03 DIAGNOSIS — J438 Other emphysema: Secondary | ICD-10-CM

## 2015-01-03 DIAGNOSIS — Z6841 Body Mass Index (BMI) 40.0 and over, adult: Secondary | ICD-10-CM

## 2015-01-03 DIAGNOSIS — I351 Nonrheumatic aortic (valve) insufficiency: Secondary | ICD-10-CM

## 2015-01-03 DIAGNOSIS — I5041 Acute combined systolic (congestive) and diastolic (congestive) heart failure: Secondary | ICD-10-CM

## 2015-01-03 DIAGNOSIS — N184 Chronic kidney disease, stage 4 (severe): Secondary | ICD-10-CM

## 2015-01-03 DIAGNOSIS — I5022 Chronic systolic (congestive) heart failure: Secondary | ICD-10-CM

## 2015-01-03 DIAGNOSIS — E785 Hyperlipidemia, unspecified: Secondary | ICD-10-CM

## 2015-01-03 LAB — GLUCOSE, CAPILLARY
Glucose-Capillary: 89 mg/dL (ref 70–99)
Glucose-Capillary: 161 mg/dL — ABNORMAL HIGH (ref 70–99)
Glucose-Capillary: 164 mg/dL — ABNORMAL HIGH (ref 70–99)

## 2015-01-03 NOTE — Progress Notes (Addendum)
Patient ID: [name] [name], male   DOB: March 02, 1946, 69 y.o.   MRN: [id number]               PROGRESS NOTE  DATE:  01/01/2015              FACILITY: [facility]            LEVEL OF CARE:   SNF   Acute Visit   HISTORY OF PRESENT ILLNESS:  This is a patient who was admitted to hospital actually with abdominal complaints.    His initial evaluation also showed acute-on-chronic congestive heart failure.    He also has severe chronic renal failure with a baseline BUN and creatinine at 74 and 2.79 at discharge.  This did not fluctuate that much in the hospital.  He came here on a really aggressive dose of diuretics including Lasix 120 three times a day as well as metolazone 10 mg a day.    Lab work from today shows a BUN of 111, a creatinine of 3.  This has gone progressively up from 77 and 2.83.  His potassium is 3.4.    His weight has gone from 260 pounds on 12/19/2014 to 245.2 pounds on 12/31/2014, a weight loss of just under 15 pounds.    REVIEW OF SYSTEMS:   GENERAL:  The patient states he feels fine.  [name] not really that conversational.   CHEST/RESPIRATORY:  No shortness of breath.  CARDIAC:   No chest pain.     GI:  No abdominal pain.    PHYSICAL EXAMINATION:   CHEST/RESPIRATORY:  Shallow, but otherwise clear air entry bilaterally.   CARDIOVASCULAR:  CARDIAC:   Heart sounds are normal.  He does not appear to be that dehydrated.   EDEMA/VARICOSITIES:  Extremities:  Much less edema than when he came in.    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  I think we can back off some on the diuretics.  I am going to reduce him to Lasix 100 mg 3x per day and metolazone 5 mg.  They can watch his weights carefully.  I am going to increase his potassium to 20 mEq b.i.d., and recheck his basic metabolic panel on Monday.    Overall, the patient appears to be stable.  No additional changes are  made.     CPT CODE: 33545
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
## 2015-01-03 NOTE — Patient Instructions (Signed)
Your physician recommends that you schedule a follow-up appointment in: 3 months Dr. Bronson Ing  Your physician recommends that you continue on your current medications as directed. Please refer to the Current Medication list given to you today.  Thank you for choosing [facility]!

## 2015-01-05 LAB — GLUCOSE, CAPILLARY
GLUCOSE-CAPILLARY: 137 mg/dL — AB (ref 70–99)
Glucose-Capillary: 101 mg/dL — ABNORMAL HIGH (ref 70–99)

## 2015-01-07 NOTE — Progress Notes (Addendum)
Patient ID: [name] [name], male   DOB: [birth year], 69 y.o.   MRN: [id number]                PROGRESS NOTE  DATE:  01/03/2015            FACILITY: [facility]               LEVEL OF CARE:   SNF   Acute Visit                     CHIEF COMPLAINT:  Follow up renal failure, fluid balance issues.    HISTORY OF PRESENT ILLNESS:  This is a man who came to [place] after presenting to hospital with abdominal issues, felt to be secondary to possible acalculus cholecystitis and pancreatitis.    He was also being followed for severe aortic regurgitation and congestive heart failure.    In the hospital, he was felt to be massively fluid-overloaded.  He came here on 120 of oral Lasix three times a day and metolazone 10.  On that regimen, his BUN and creatinine climbed to a high of 111 and 3, respectively.  I have recently reduced the Lasix to 100 mg t.i.d. and his metolazone to 5.  I am following him today for that issue.    His weight was 260 pounds on admission here on 12/19/2014, dropping down to 245 pounds on 12/31/2014.  Today's weight is 246 pounds on the reduced dose of diuretics.    His blood pressure is reasonably well controlled at 144/54.    PHYSICAL EXAMINATION:   GENERAL APPEARANCE:  The patient is not in any distress.   CHEST/RESPIRATORY:  Clear air entry bilaterally.    CARDIOVASCULAR:   CARDIAC:  Heart sounds are normal.  There are no murmurs.    EDEMA/VARICOSITIES:  I think his edema is under as good control as I can get it.     ASSESSMENT/PLAN:                          Chronic renal failure.  Worsening due to excessive fluid losses.  I have backed off on the diuretics.  His weight is stable today.  His blood pressure is reasonable.  I think his BUN and creatinine are also reasonable.  I am not going to change anything today.  Follow up lab work, including a comprehensive metabolic panel, next Monday.

## 2015-01-08 ENCOUNTER — Non-Acute Institutional Stay (SKILLED_NURSING_FACILITY): Payer: Medicare HMO | Admitting: Internal Medicine

## 2015-01-08 DIAGNOSIS — N189 Chronic kidney disease, unspecified: Secondary | ICD-10-CM

## 2015-01-08 DIAGNOSIS — E1122 Type 2 diabetes mellitus with diabetic chronic kidney disease: Secondary | ICD-10-CM

## 2015-01-08 DIAGNOSIS — N184 Chronic kidney disease, stage 4 (severe): Secondary | ICD-10-CM

## 2015-01-08 LAB — GLUCOSE, CAPILLARY
GLUCOSE-CAPILLARY: 99 mg/dL (ref 70–99)
Glucose-Capillary: 141 mg/dL — ABNORMAL HIGH (ref 70–99)

## 2015-01-10 ENCOUNTER — Non-Acute Institutional Stay (SKILLED_NURSING_FACILITY): Payer: Medicare HMO | Admitting: Internal Medicine

## 2015-01-10 ENCOUNTER — Other Ambulatory Visit: Payer: Self-pay | Admitting: *Deleted

## 2015-01-10 ENCOUNTER — Encounter (HOSPITAL_COMMUNITY)
Admission: RE | Admit: 2015-01-10 | Discharge: 2015-01-10 | Disposition: A | Discharge status: Home / Self Care | Payer: Medicare HMO | Source: Skilled Nursing Facility | Attending: Internal Medicine | Admitting: Internal Medicine

## 2015-01-10 DIAGNOSIS — E1122 Type 2 diabetes mellitus with diabetic chronic kidney disease: Secondary | ICD-10-CM

## 2015-01-10 DIAGNOSIS — N184 Chronic kidney disease, stage 4 (severe): Secondary | ICD-10-CM

## 2015-01-10 DIAGNOSIS — N189 Chronic kidney disease, unspecified: Secondary | ICD-10-CM

## 2015-01-10 LAB — BASIC METABOLIC PANEL
Anion gap: 12 (ref 5–15)
BUN: 110 mg/dL — AB (ref 6–23)
CALCIUM: 9.4 mg/dL (ref 8.4–10.5)
CO2: 30 mmol/L (ref 19–32)
CREATININE: 2.59 mg/dL — AB (ref 0.50–1.35)
Chloride: 94 mmol/L — ABNORMAL LOW (ref 96–112)
GFR calc Af Amer: 27 mL/min — ABNORMAL LOW (ref >90)
GFR, EST NON AFRICAN AMERICAN: 23 mL/min — AB (ref >90)
Glucose, Bld: 89 mg/dL (ref 70–99)
POTASSIUM: 3.9 mmol/L (ref 3.5–5.1)
Sodium: 136 mmol/L (ref 135–145)

## 2015-01-10 LAB — CBC
HCT: 37.6 % — ABNORMAL LOW (ref 39.0–52.0)
HEMOGLOBIN: 11.9 g/dL — AB (ref 13.0–17.0)
MCH: 27.5 pg (ref 26.0–34.0)
MCHC: 31.6 g/dL (ref 30.0–36.0)
MCV: 86.8 fL (ref 78.0–100.0)
PLATELETS: 175 10*3/uL (ref 150–400)
RBC: 4.33 MIL/uL (ref 4.22–5.81)
RDW: 15.9 % — ABNORMAL HIGH (ref 11.5–15.5)
WBC: 7.8 10*3/uL (ref 4.0–10.5)

## 2015-01-10 LAB — GLUCOSE, CAPILLARY
GLUCOSE-CAPILLARY: 113 mg/dL — AB (ref 70–99)
Glucose-Capillary: 167 mg/dL — ABNORMAL HIGH (ref 70–99)

## 2015-01-10 LAB — T4: T4 TOTAL: 4.6 ug/dL — AB (ref 4.5–12.0)

## 2015-01-10 LAB — TSH: TSH: 0.309 u[IU]/mL — ABNORMAL LOW (ref 0.350–4.500)

## 2015-01-10 MED ORDER — HYDROCODONE-ACETAMINOPHEN 5-325 MG PO TABS: ORAL_TABLET | ORAL | Status: DC

## 2015-01-10 NOTE — Telephone Encounter (Signed)
[facility] 

## 2015-01-10 NOTE — Progress Notes (Addendum)
Patient ID: [name] [name], male   DOB: [birth year], 69 y.o.   MRN: [id number]               PROGRESS NOTE  DATE:  01/08/2015                 FACILITY: [facility]       LEVEL OF CARE:   SNF   Acute Visit   CHIEF COMPLAINT:  Follow up renal failure.    HISTORY OF PRESENT ILLNESS:  This is a patient who came to [place] with abdominal issues, felt to be possibly acalculous cholecystitis and pancreatitis.  We have not had an issue here.    He also has congestive heart failure secondary to severe aortic regurgitation.    He was found in the hospital to be massively fluid-overloaded.  He came here on Lasix 120 mg orally three times a day and metolazone 10.  On that regimen, his BUN and creatinine climbed to 111 and 3, respectively.  Last week, I reduced the Lasix to 100 mg  t.i.d. and his metolazone to 5.  On this regimen, his weight has remained stable at 246.4 pounds.  This has been stable for several days.  His weight on admission was 260 pounds.    LABORATORY DATA:   Lab work today shows a BUN of 122 and a creatinine of 2.84.  The relative increase in his BUN relative to the creatinine would suggest some degree of volume contraction, although I do not see this at the bedside.    PHYSICAL EXAMINATION:                    GENERAL APPEARANCE:  The patient is not in any distress.   CHEST/RESPIRATORY:  Clear air entry bilaterally.    CARDIOVASCULAR:  CARDIAC:   Heart sounds are normal.  There are no murmurs.  He appears to be euvolemic.   EDEMA/VARICOSITIES:  Extremities:  I think his edema is under reasonable control.    ASSESSMENT/PLAN:                          Chronic renal failure.   The worsening of his BUN relative to his creatinine is a bit of a puzzle.  I have already backed off of his diuretics.  When he left the hospital, his BUN was 71 and creatinine 2.67.  I think we can back off on the metolazone completely.  He probably should be checked for an upper GI source of bleeding,  as well, which I will initiate by following up a hemoglobin.     CPT CODE: 63893

## 2015-01-11 LAB — GLUCOSE, CAPILLARY: Glucose-Capillary: 101 mg/dL — ABNORMAL HIGH (ref 70–99)

## 2015-01-11 LAB — T3: T3, Total: 64 ng/dL — ABNORMAL LOW (ref 71–180)

## 2015-01-12 LAB — GLUCOSE, CAPILLARY
Glucose-Capillary: 121 mg/dL — ABNORMAL HIGH (ref 70–99)
Glucose-Capillary: 145 mg/dL — ABNORMAL HIGH (ref 70–99)

## 2015-01-13 ENCOUNTER — Inpatient Hospital Stay (HOSPITAL_COMMUNITY): Payer: Medicare HMO | Attending: Internal Medicine

## 2015-01-13 ENCOUNTER — Other Ambulatory Visit (HOSPITAL_COMMUNITY)
Admission: RE | Admit: 2015-01-13 | Discharge: 2015-01-13 | Disposition: A | Discharge status: Home / Self Care | Payer: Medicare HMO | Source: Ambulatory Visit | Attending: Internal Medicine | Admitting: Internal Medicine

## 2015-01-13 LAB — CBC WITH DIFFERENTIAL/PLATELET
Basophils Absolute: 0.1 10*3/uL (ref 0.0–0.1)
Basophils Relative: 1 % (ref 0–1)
Eosinophils Absolute: 1.7 10*3/uL — ABNORMAL HIGH (ref 0.0–0.7)
Eosinophils Relative: 25 % — ABNORMAL HIGH (ref 0–5)
HEMATOCRIT: 33.8 % — AB (ref 39.0–52.0)
Hemoglobin: 10.6 g/dL — ABNORMAL LOW (ref 13.0–17.0)
Lymphocytes Relative: 14 % (ref 12–46)
Lymphs Abs: 1 10*3/uL (ref 0.7–4.0)
MCH: 27.7 pg (ref 26.0–34.0)
MCHC: 31.4 g/dL (ref 30.0–36.0)
MCV: 88.5 fL (ref 78.0–100.0)
MONO ABS: 0.5 10*3/uL (ref 0.1–1.0)
Monocytes Relative: 8 % (ref 3–12)
NEUTROS ABS: 3.6 10*3/uL (ref 1.7–7.7)
Neutrophils Relative %: 52 % (ref 43–77)
PLATELETS: 149 10*3/uL — AB (ref 150–400)
RBC: 3.82 MIL/uL — ABNORMAL LOW (ref 4.22–5.81)
RDW: 16.1 % — AB (ref 11.5–15.5)
WBC: 7 10*3/uL (ref 4.0–10.5)

## 2015-01-13 LAB — BASIC METABOLIC PANEL
Anion gap: 6 (ref 5–15)
BUN: 89 mg/dL — ABNORMAL HIGH (ref 6–23)
CALCIUM: 8.8 mg/dL (ref 8.4–10.5)
CHLORIDE: 100 mmol/L (ref 96–112)
CO2: 28 mmol/L (ref 19–32)
Creatinine, Ser: 2.58 mg/dL — ABNORMAL HIGH (ref 0.50–1.35)
GFR calc non Af Amer: 24 mL/min — ABNORMAL LOW (ref >90)
GFR, EST AFRICAN AMERICAN: 27 mL/min — AB (ref >90)
Glucose, Bld: 123 mg/dL — ABNORMAL HIGH (ref 70–99)
Potassium: 4.2 mmol/L (ref 3.5–5.1)
SODIUM: 134 mmol/L — AB (ref 135–145)

## 2015-01-13 LAB — GLUCOSE, CAPILLARY: GLUCOSE-CAPILLARY: 92 mg/dL (ref 70–99)

## 2015-01-13 NOTE — Progress Notes (Addendum)
Patient ID: [name] [name], male   DOB: 1946/07/01, 69 y.o.   MRN: [id number]               PROGRESS NOTE  DATE:  01/10/2015                   FACILITY: [facility]                   LEVEL OF CARE:   SNF   Acute Visit   HISTORY OF PRESENT ILLNESS:  This is a patient who had a hospitalization from 12/04/2014 through 03/49/1791 which was complicated by GI issues, felt to be secondary to acalculous cholecystitis.    He also had acute-on-chronic congestive heart failure in the setting of severe chronic renal failure.  His discharge creatinine was 2.79.    He also has severe aortic insufficiency, although he is felt to be too high a surgical risk.    He came here on very high doses of diuretics including Lasix 125 three times a day, metolazone 10 mg a day.  His BUN and creatinine progressively climbed on that regimen.  Most recently on 01/08/2015, this was 122 and 2.84, respectively.  I have been reducing his diuretics.  I stopped his metolazone after the lab work quoted above.  [name] currently on Lasix 100 mg three times a day.    LABORATORY DATA:    Today, unfortunately, his creatinine is 2.59.  His BUN is still pending.  CO2 is 30, potassium 3.9, sodium 136.    White count is 7.8.  Hemoglobin is 11.9.  As far as I can see, the hemoglobin appears to be stable.  There is no evidence that he is bleeding from an upper GI source enough to be contributing to his elevated BUN, for instance.    PHYSICAL EXAMINATION:                   VITAL SIGNS:   O2 SATURATIONS:  94% on his chronic 2 L.   PULSE:  82.   RESPIRATIONS:  18.   GENERAL APPEARANCE:  The patient looks well.  [name] bright and alert.   CHEST/RESPIRATORY:  Clear air entry bilaterally.    CARDIOVASCULAR:  CARDIAC:  Heart sounds are regular.  I hear no diastolic murmur compatible with his AI, but I have never been able to hear this.  He appears to be euvolemic.    EDEMA/VARICOSITIES:  Extremities:  He has venous stasis, but  minimal edema.    ASSESSMENT/PLAN:                      Chronic renal failure.  I am still awaiting his BUN.  However, his creatinine is stable on the reduced dose of his Lasix.   Today's weight is 245.2 pounds, which has not really changed over several days now.  I am planning to keep him on the Lasix at 100 mg three times a day.

## 2015-01-14 LAB — GLUCOSE, CAPILLARY
Glucose-Capillary: 149 mg/dL — ABNORMAL HIGH (ref 70–99)
Glucose-Capillary: 371 mg/dL — ABNORMAL HIGH (ref 70–99)
Glucose-Capillary: 399 mg/dL — ABNORMAL HIGH (ref 70–99)

## 2015-01-15 ENCOUNTER — Other Ambulatory Visit (HOSPITAL_COMMUNITY)
Admission: RE | Admit: 2015-01-15 | Discharge: 2015-01-15 | Disposition: A | Discharge status: Home / Self Care | Payer: Medicare HMO | Source: Ambulatory Visit | Attending: Internal Medicine | Admitting: Internal Medicine

## 2015-01-15 ENCOUNTER — Non-Acute Institutional Stay (SKILLED_NURSING_FACILITY): Payer: Medicare HMO | Admitting: Internal Medicine

## 2015-01-15 DIAGNOSIS — I5041 Acute combined systolic (congestive) and diastolic (congestive) heart failure: Secondary | ICD-10-CM

## 2015-01-15 DIAGNOSIS — N184 Chronic kidney disease, stage 4 (severe): Secondary | ICD-10-CM

## 2015-01-15 LAB — BASIC METABOLIC PANEL
Anion gap: 6 (ref 5–15)
BUN: 87 mg/dL — AB (ref 6–23)
CHLORIDE: 103 mmol/L (ref 96–112)
CO2: 25 mmol/L (ref 19–32)
Calcium: 8.8 mg/dL (ref 8.4–10.5)
Creatinine, Ser: 2.64 mg/dL — ABNORMAL HIGH (ref 0.50–1.35)
GFR calc Af Amer: 27 mL/min — ABNORMAL LOW (ref >90)
GFR calc non Af Amer: 23 mL/min — ABNORMAL LOW (ref >90)
Glucose, Bld: 89 mg/dL (ref 70–99)
Potassium: 5 mmol/L (ref 3.5–5.1)
Sodium: 134 mmol/L — ABNORMAL LOW (ref 135–145)

## 2015-01-15 LAB — GLUCOSE, CAPILLARY: Glucose-Capillary: 106 mg/dL — ABNORMAL HIGH (ref 70–99)

## 2015-01-16 ENCOUNTER — Encounter: Payer: Self-pay | Admitting: Internal Medicine

## 2015-01-16 ENCOUNTER — Encounter (HOSPITAL_COMMUNITY)
Admission: RE | Admit: 2015-01-16 | Discharge: 2015-01-16 | Disposition: A | Discharge status: Home / Self Care | Payer: Medicare HMO | Source: Skilled Nursing Facility | Attending: Internal Medicine | Admitting: Internal Medicine

## 2015-01-16 ENCOUNTER — Non-Acute Institutional Stay (SKILLED_NURSING_FACILITY): Payer: Medicare HMO | Admitting: Internal Medicine

## 2015-01-16 DIAGNOSIS — K625 Hemorrhage of anus and rectum: Secondary | ICD-10-CM

## 2015-01-16 DIAGNOSIS — I5043 Acute on chronic combined systolic (congestive) and diastolic (congestive) heart failure: Secondary | ICD-10-CM

## 2015-01-16 LAB — CBC WITH DIFFERENTIAL/PLATELET
Basophils Absolute: 0.1 10*3/uL (ref 0.0–0.1)
Basophils Relative: 2 % — ABNORMAL HIGH (ref 0–1)
EOS ABS: 1.5 10*3/uL — AB (ref 0.0–0.7)
Eosinophils Relative: 21 % — ABNORMAL HIGH (ref 0–5)
HEMATOCRIT: 32.5 % — AB (ref 39.0–52.0)
Hemoglobin: 10.4 g/dL — ABNORMAL LOW (ref 13.0–17.0)
LYMPHS ABS: 1 10*3/uL (ref 0.7–4.0)
Lymphocytes Relative: 14 % (ref 12–46)
MCH: 28.2 pg (ref 26.0–34.0)
MCHC: 32 g/dL (ref 30.0–36.0)
MCV: 88.1 fL (ref 78.0–100.0)
Monocytes Absolute: 0.6 10*3/uL (ref 0.1–1.0)
Monocytes Relative: 8 % (ref 3–12)
Neutro Abs: 4.1 10*3/uL (ref 1.7–7.7)
Neutrophils Relative %: 55 % (ref 43–77)
Platelets: 164 10*3/uL (ref 150–400)
RBC: 3.69 MIL/uL — ABNORMAL LOW (ref 4.22–5.81)
RDW: 16.2 % — AB (ref 11.5–15.5)
WBC: 7.4 10*3/uL (ref 4.0–10.5)

## 2015-01-16 LAB — BASIC METABOLIC PANEL
Anion gap: 8 (ref 5–15)
BUN: 93 mg/dL — ABNORMAL HIGH (ref 6–23)
CO2: 27 mmol/L (ref 19–32)
CREATININE: 2.73 mg/dL — AB (ref 0.50–1.35)
Calcium: 8.9 mg/dL (ref 8.4–10.5)
Chloride: 101 mmol/L (ref 96–112)
GFR calc non Af Amer: 22 mL/min — ABNORMAL LOW (ref >90)
GFR, EST AFRICAN AMERICAN: 26 mL/min — AB (ref >90)
Glucose, Bld: 110 mg/dL — ABNORMAL HIGH (ref 70–99)
Potassium: 4.2 mmol/L (ref 3.5–5.1)
Sodium: 136 mmol/L (ref 135–145)

## 2015-01-16 LAB — GLUCOSE, CAPILLARY: Glucose-Capillary: 98 mg/dL (ref 70–99)

## 2015-01-16 LAB — PHENYTOIN LEVEL, TOTAL: PHENYTOIN LVL: 16.7 ug/mL (ref 10.0–20.0)

## 2015-01-16 LAB — T4, FREE: Free T4: 0.61 ng/dL — ABNORMAL LOW (ref 0.80–1.80)

## 2015-01-16 NOTE — Progress Notes (Signed)
Patient ID: [name] [name], male   DOB: 1946-07-16, 69 y.o.   MRN: [id number]   This is an acute visit.  Level care skilled.  Facility [facility].  Chief complaint-acute visit secondary to rectal bleeding.  History of present illness.  This is [name] [name] came to [place] after presenting in the hospital with abdominal issues for secondary to possible acalculous cholecystitis and pancreatitis  [name] also followed for aortic regurgitation and CHF.  Dr. Dellia Nims saw him yesterday apparently he's had some weight gain and his metolazone was restarted he also is on a significant amount of Lasix 100 mg 3 times a day.  Patient reported a small amount of blood after a bowel movement.--- Yesterday-apparently this has occurred subsequent time as well.  Is not complaining of any increased weakness dizziness shortness of breath or palpitations  I have reviewed his records in Epic and appears on September 1 he did go to the ER with complaints of rectal bleeding-workup there was negative they did do a CT scan of the abdomen that did not show any reason for the bleeding-blood work was stable-there was recommendation to follow-up with primary care provider-  His hemoglobin at that time was 12.1--appears his baseline per chart review it is between 11 and 12-again it was 10.6 last week His vital signs remained stable.  I do note he is on aspirin.  Family medical social history reviewed per history and physical on 12/24/2014.  Medications have been reviewed per MAR.  Review of systems.  In general no complaints of fever or chills.  Skin does not complain of any increased rashes or itching.  Head ears eyes nose mouth and throat-no visual changes or sore throat.  Respiratory does not complain of any increased shortness of breath from baseline or cough.  Cardiac significant history here but does not complain of chest pain has chronic lower extremity edema.  GI does not complain of abdominal discomfort  actually he was eating when I entered the room-no nausea or vomiting-.  GU does not complain of dysuria.  Rectal again has had some history of apparently intermittent bleeding after bowel movements.  Musculoskeletal is not complaining of joint pain he uses a walker to ambulate.  Neurologic is not complaining of any dizziness or headache.  Physical exam.  Temperature 98.5 pulse 80 respirations 22 blood pressure 133/85 O2 saturation is 95% on chronic oxygen weight today is 251.9 this appears to fluctuate it was 252 back in mid January to 49.9 on February 6-it was 247.6 yesterday-again his metolazone has just been restarted.  In general this is a somewhat obese elderly male in no distress.  His skin is warm and dry.  Sclera and conjunctiva are clear visual acuity appears grossly intact.  Oropharynx is clear mucous membranes moist.  Chest is clear to auscultation there is no labored breathing.  Heart is regular rate and rhythm has a 2/6 systolic murmur as well as a diastolic murmur this is not new-I would say he has 1-2 plus edema according nursing this is somewhat improved.  Abdomen is protuberant soft nontender with positive bowel sounds.  Rectal-I did not note any significant external hemorrhoids or acute bleeding-digital exam did reveal occult positive blood although there was minimal stool sample  Skeletal skeletal does move all extremities 4 in sit up without assistance and uses walker moves his extremities at baseline.  Neurologic is grossly intact speech is clear.  Psych he is pleasant and appropriate.  However when I asked him about  his ER visit in September he did not recall the reason for  it or the rectal bleeding  Labs.  01/15/2015.  Sodium 134 potassium 5 BUN 87 creatinine 2.64.  01/13/2015.  WBC 7.0 hemoglobin 10.6 platelets 149.  Assessment and plan.  #1-rectal bleeding-apparently this is not totally new per review of records-clinically he appears to be  stable.--We did order updated labs today which actually are stable with previous labs his hemoglobin is 10.4 platelets 164 white count 7.4.  Metabolic panel shows creatinine of 2.73 BUN of 93 sodium 136 potassium 4.2.  [name] on aspirin 325 mg a day for his cardiac issues he is also on Prilosec 20 mg daily-I discussed this with Dr. Dellia Nims via phone-will order a GI consult at this point clinically appears stable      .  #2-history CHF-again metolazone was recently restarted he is on high-dose Lasix as noted above-questionable weight gain today clinically he appears stable however again the metolazone was just restarted will await weight tomorrow-follow up by Dr. Garen Grams note greater than 40 minutes spent assessing patient-discussing his status with nursing staff-review of his hospital records and previous medical history in [place] coordinating and formulating a plan of care-of note greater than 50% of time spent coordinating plan of care

## 2015-01-16 NOTE — Progress Notes (Addendum)
Patient ID: [name] [name], male   DOB: Dec 24, 1945, 69 y.o.   MRN: [id number]               PROGRESS NOTE  DATE:  01/15/2015                FACILITY: [facility]                    LEVEL OF CARE:   SNF   Acute Visit   CHIEF COMPLAINT:  Weight gain.      HISTORY OF PRESENT ILLNESS:  This is a man who has stage IV chronic renal failure.  He also has severe aortic regurgitation, congestive heart failure, and pulmonary hypertension, on chronic oxygen.    When he arrived here, he was on Lasix 120 mg orally three times a day and metolazone 10 mg a day.  His renal functions progressively worsened on this regimen, up to a BUN over 120 and a creatinine of 3.  I then backed down on the metolazone and actually stopped this last week.  Specifically, this was done on 01/08/2015 and, as of the last time I saw him, everything appeared to be stable.  His weight had gotten down to a low of 242 pounds on 01/11/2015 on the Lasix 100 three times a day regimen.  His BUN got down to 89, creatinine of 2.58 on 01/13/2015.  Repeated today at 87 and 2.64, respectively.  His potassium is up to 5.    Over the weekend, he developed shortness of breath.  A chest x-ray was done that showed improvement in his congestive heart failure versus the last study on 12/06/2014.  However, he had a patchy infiltrate in the right mid lung with streaking.  I have reviewed this.  It does look like possible pneumonia, although he has no clinical evidence of this.  However, he is complaining of increasing shortness of breath.     REVIEW OF SYSTEMS:          CHEST/RESPIRATORY:  Shortness of breath without coughing.  [name] complaining of nasal congestion.   CARDIAC:   No complaints of chest pain or palpitations.   GI:  No nausea or vomiting.      PHYSICAL EXAMINATION:   GENERAL APPEARANCE:  The patient does not look to be in any distress.    CHEST/RESPIRATORY:  Clear air entry bilaterally.    [name] on chronic oxygen.  His O2  sat is 97% on 2 L.   CARDIOVASCULAR:  CARDIAC:  Heart sounds are soft.  He appears to have a systolic ejection murmur.  I did not appreciate this in the past.  I have never been able to hear his aortic insufficiency murmur.  The new murmur sounds like tricuspid regurgitation.  Other than this, his JVP is not elevated.   EDEMA/VARICOSITIES:  He does have scant coccyx edema and perhaps more edema in his legs.   GASTROINTESTINAL:  ABDOMEN:   Distended, but without evidence of ascites.    ASSESSMENT/PLAN:                Weight gain.  I think this probably represents congestive heart failure.  I am going to start him back on metolazone at 5 mg a day.  I will recheck his basic metabolic panel on Wednesday.  His potassium is 5.  I suspect restarting the metolazone will take care of this.    ?Right lower lung pneumonia.  I see no  evidence of this and I do not think he requires antibiotics at this point.    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    Increased BUN relative to creatinine. His Hgb was stable. Likely reflects volume status

## 2015-01-17 LAB — GLUCOSE, CAPILLARY
GLUCOSE-CAPILLARY: 84 mg/dL (ref 70–99)
GLUCOSE-CAPILLARY: 97 mg/dL (ref 70–99)
Glucose-Capillary: 98 mg/dL (ref 70–99)

## 2015-01-19 LAB — GLUCOSE, CAPILLARY
GLUCOSE-CAPILLARY: 96 mg/dL (ref 70–99)
GLUCOSE-CAPILLARY: 128 mg/dL — AB (ref 70–99)

## 2015-01-22 ENCOUNTER — Ambulatory Visit: Payer: Medicare HMO | Admitting: Nurse Practitioner

## 2015-01-22 LAB — GLUCOSE, CAPILLARY
Glucose-Capillary: 121 mg/dL — ABNORMAL HIGH (ref 70–99)
Glucose-Capillary: 111 mg/dL — ABNORMAL HIGH (ref 70–99)

## 2015-01-23 LAB — GLUCOSE, CAPILLARY: GLUCOSE-CAPILLARY: 114 mg/dL — AB (ref 70–99)

## 2015-01-24 ENCOUNTER — Ambulatory Visit (INDEPENDENT_AMBULATORY_CARE_PROVIDER_SITE_OTHER): Payer: Medicare HMO | Admitting: Nurse Practitioner

## 2015-01-24 ENCOUNTER — Encounter: Payer: Self-pay | Admitting: Nurse Practitioner

## 2015-01-24 ENCOUNTER — Non-Acute Institutional Stay (SKILLED_NURSING_FACILITY): Payer: Medicare HMO | Admitting: Internal Medicine

## 2015-01-24 VITALS — BP 123/52 | HR 80 | Temp 97.3°F | Ht 73.0 in | Wt 252.8 lb

## 2015-01-24 DIAGNOSIS — K649 Unspecified hemorrhoids: Secondary | ICD-10-CM | POA: Insufficient documentation

## 2015-01-24 DIAGNOSIS — K5900 Constipation, unspecified: Secondary | ICD-10-CM

## 2015-01-24 DIAGNOSIS — K625 Hemorrhage of anus and rectum: Secondary | ICD-10-CM

## 2015-01-24 DIAGNOSIS — N184 Chronic kidney disease, stage 4 (severe): Secondary | ICD-10-CM

## 2015-01-24 DIAGNOSIS — I5043 Acute on chronic combined systolic (congestive) and diastolic (congestive) heart failure: Secondary | ICD-10-CM

## 2015-01-24 LAB — GLUCOSE, CAPILLARY
GLUCOSE-CAPILLARY: 109 mg/dL — AB (ref 70–99)
GLUCOSE-CAPILLARY: 155 mg/dL — AB (ref 70–99)

## 2015-01-24 MED ORDER — POLYETHYLENE GLYCOL 3350 17 GM/SCOOP PO POWD: 17.0000 g | Freq: Every day | ORAL | Status: DC

## 2015-01-24 MED ORDER — HYDROCORTISONE 2.5 % RE CREA: 1.0000 "application " | TOPICAL_CREAM | Freq: Two times a day (BID) | RECTAL | Status: DC
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
## 2015-01-24 NOTE — Assessment & Plan Note (Signed)
On narcotic pain meds with colace. Continued persistent hard stools with rectal bleeding. Will add Miralax 17 g daily for improved regularity and softer stools. Can back off to three times a week if patient begins to have loose stools.

## 2015-01-24 NOTE — Patient Instructions (Signed)
1. We will schedule your colonoscopy for you 2. Take Miralax 17 gm once daily for improved regularity and softening of stools. Can back off to three times a week if daily causes loose stools. Continue the colace 3. Take the Anusol suppository twice daily for up to 7 days at a time for hemorrhoid discomfort 4. Further recommendations to be based on the results of your procedure.

## 2015-01-24 NOTE — Assessment & Plan Note (Signed)
Has a history of hemorrhoids which are symptomatic with rectal pain/irritation. Also with low volume hematochezia with bowel movements with chronic constipation. No other warning signs/symptoms like weight loss, VS instability, gross change in labs, etc. Hgb low but stable at 10.4. Episode of rectal bleeding 08/08/14 with CT with no acute process identified. Is on narcotic pain medicine and colace, but no other stool softener tried. Last colonosocpy 2008 negative screening study, recommended repeat in 10 years. Likely benign anorectal source but cannot rule out other more occult GI process. Will proceed with colonoscopy to rule out more occult process.  Proceed with TCS with Dr. Gala Romney in near future: the risks, benefits, and alternatives have been discussed with the patient in detail. The patient states understanding and desires to proceed.  Requested cardiology clearance due to severe aortic regurgitation with consideration of valve replacement. Will await cardiology input before scheduling.

## 2015-01-24 NOTE — Assessment & Plan Note (Addendum)
Has a history of hemorrhoids which are symptomatic with rectal pain/irritation. Also with low volume hematochezia with bowel movements with chronic constipation. No other warning signs/symptoms like weight loss, VS instability, gross change in labs, etc. Hgb low but stable at 10.4. Episode of rectal bleeding 08/08/14 with CT with no acute process identified. Is on narcotic pain medicine and colace, but no other stool softener tried. Last colonosocpy 2008 negative screening study, recommended repeat in 10 years. Likely benign anorectal source but cannot rule out other more occult GI process. Will proceed with colonoscopy to rule out more occult process. Will also add Anusol rectal suppositroy for symptomsatic relief bid for up to 7 days at a time.  Proceed with TCS with Dr. Gala Romney in near future: the risks, benefits, and alternatives have been discussed with the patient in detail. The patient states understanding and desires to proceed.  Requested cardiology clearance due to severe aortic regurgitation with consideration of valve replacement. Will await cardiology input before scheduling.

## 2015-01-25 ENCOUNTER — Telehealth: Payer: Self-pay

## 2015-01-25 NOTE — Telephone Encounter (Signed)
He can proceed but would have it done with anesthesia, as he has several severe comorbidities including severe aortic regurgitation and associated heart failure. Based on recent outpatient notes from Dr. Dellia Nims, he is still requiring many med adjustments on a regular basis.

## 2015-01-25 NOTE — Telephone Encounter (Signed)
To Dr Bronson Ing

## 2015-01-25 NOTE — Telephone Encounter (Signed)
Dr. Lorie Apley,   This pt was seen in our office on 01/24/2015 by Walden Field, NP for recurrent rectal bleed.  We would like to schedule him for a colonoscopy in the very near future with Dr. Gala Romney.   Would just like to know if he may have a cardiac clearance to schedule this.  Thanks so much for your time and help!

## 2015-01-26 LAB — GLUCOSE, CAPILLARY
GLUCOSE-CAPILLARY: 118 mg/dL — AB (ref 70–99)
GLUCOSE-CAPILLARY: 161 mg/dL — AB (ref 70–99)

## 2015-01-27 NOTE — Progress Notes (Signed)
CC'ED TO PCP 

## 2015-01-29 ENCOUNTER — Encounter (HOSPITAL_COMMUNITY)
Admission: RE | Admit: 2015-01-29 | Discharge: 2015-01-29 | Disposition: A | Discharge status: Home / Self Care | Payer: Medicare HMO | Source: Skilled Nursing Facility | Attending: Internal Medicine | Admitting: Internal Medicine

## 2015-01-29 ENCOUNTER — Encounter (HOSPITAL_COMMUNITY)
Admission: RE | Admit: 2015-01-29 | Discharge: 2015-01-29 | Disposition: A | Discharge status: Home / Self Care | Payer: Medicare HMO | Source: Ambulatory Visit | Attending: Internal Medicine | Admitting: Internal Medicine

## 2015-01-29 LAB — BASIC METABOLIC PANEL
Anion gap: 12 (ref 5–15)
BUN: 102 mg/dL — ABNORMAL HIGH (ref 6–23)
CHLORIDE: 93 mmol/L — AB (ref 96–112)
CO2: 28 mmol/L (ref 19–32)
Calcium: 8.9 mg/dL (ref 8.4–10.5)
Creatinine, Ser: 2.58 mg/dL — ABNORMAL HIGH (ref 0.50–1.35)
GFR calc Af Amer: 27 mL/min — ABNORMAL LOW (ref >90)
GFR calc non Af Amer: 24 mL/min — ABNORMAL LOW (ref >90)
Glucose, Bld: 96 mg/dL (ref 70–99)
Potassium: 4 mmol/L (ref 3.5–5.1)
SODIUM: 133 mmol/L — AB (ref 135–145)

## 2015-01-29 LAB — CBC WITH DIFFERENTIAL/PLATELET
Basophils Absolute: 0.1 10*3/uL (ref 0.0–0.1)
Basophils Relative: 1 % (ref 0–1)
Eosinophils Absolute: 0.9 10*3/uL — ABNORMAL HIGH (ref 0.0–0.7)
Eosinophils Relative: 17 % — ABNORMAL HIGH (ref 0–5)
HCT: 30.1 % — ABNORMAL LOW (ref 39.0–52.0)
HEMOGLOBIN: 9.5 g/dL — AB (ref 13.0–17.0)
Lymphocytes Relative: 22 % (ref 12–46)
Lymphs Abs: 1.2 10*3/uL (ref 0.7–4.0)
MCH: 28.2 pg (ref 26.0–34.0)
MCHC: 31.6 g/dL (ref 30.0–36.0)
MCV: 89.3 fL (ref 78.0–100.0)
MONO ABS: 0.5 10*3/uL (ref 0.1–1.0)
MONOS PCT: 10 % (ref 3–12)
NEUTROS ABS: 2.8 10*3/uL (ref 1.7–7.7)
Neutrophils Relative %: 50 % (ref 43–77)
Platelets: 150 10*3/uL (ref 150–400)
RBC: 3.37 MIL/uL — AB (ref 4.22–5.81)
RDW: 17.4 % — ABNORMAL HIGH (ref 11.5–15.5)
WBC: 5.4 10*3/uL (ref 4.0–10.5)

## 2015-01-29 LAB — GLUCOSE, CAPILLARY
Glucose-Capillary: 99 mg/dL (ref 70–99)
Glucose-Capillary: 101 mg/dL — ABNORMAL HIGH (ref 70–99)

## 2015-01-29 NOTE — Progress Notes (Addendum)
Patient ID: [name] [name], male   DOB: 1946-10-14, 69 y.o.   MRN: [id number]                PROGRESS NOTE  DATE:  01/24/2015               FACILITY: [facility]                 LEVEL OF CARE:   SNF   Acute Visit   CHIEF COMPLAINT:  Review of chronic renal failure, congestive heart failure.    HISTORY OF PRESENT ILLNESS:  This is a man who has stage IV chronic renal failure.    He also has severe aortic regurgitation.  [name] felt not to be a good candidate for surgery.  He has congestive heart failure secondary to this, and pulmonary hypertension, on chronic oxygen.    When he arrived in the building, he was on Lasix 120 mg three times a day and metolazone 10 mg a day.  His renal functions were gradually worsened on this regimen to a BUN of over 120 and a creatinine of over 3.  I had backed down on the metolazone and actually stopped this.  [name] currently on Lasix 100 three times a day.    Last lab work from 01/13/2015 showed a BUN of 89, a creatinine of 2.58.  I think this is reasonably in his baseline.    With regards to his weights, there has been some fluctuation here.  However, this has been very stable in the 247-249 range over the last 11 days.    REVIEW OF SYSTEMS:    CHEST/RESPIRATORY:  The patient states his shortness of breath is at the baseline.   CARDIAC:  No chest pain.   GI:  He has been to see Dr. Gala Romney with regards to his rectal bleeding and an outpatient colonoscopy will be arranged.  He has not had any further rectal bleeding that I am aware of, and his last hemoglobin was 10.6 on 01/13/2015.    PHYSICAL EXAMINATION:   VITAL SIGNS:   O2 SATURATIONS:  99% on his current 2 L.    RESPIRATIONS:  18 and unlabored.     PULSE:   73 and regular.   GENERAL APPEARANCE:  The patient looks in absolutely no distress.   CHEST/RESPIRATORY:  Clear air entry bilaterally.    CARDIOVASCULAR:   CARDIAC:  Heart sounds are normal.  I have never appreciated his diastolic  murmur from aortic insufficiency.  He appears to be euvolemic.  His JVP is not elevated.   EDEMA/VARICOSITIES:  He has venous stasis with some resultant edema just above his ankles bilaterally.  There is no evidence of a DVT.    ASSESSMENT/PLAN:                      Chronic renal failure.  I will repeat his lab work, as his discharge is coming up next week, to include a basic metabolic panel.    Rectal bleeding.  His hemoglobin actually dropped from 11.9 to 10.6.  I will also recheck this.    Pulmonary hypertension.  On chronic oxygen.  This appears to be stable.    Overall, this man looks really quite good.  I plan to discharge him on 100 mg t.i.d. of Lasix with no metolazone as this seems to have left him in a stable clinical situation.  It is not impossible that he will go home  and consume more fluid than he does here, so this will need to be carefully followed by home health and his primary physician.

## 2015-01-31 ENCOUNTER — Non-Acute Institutional Stay (SKILLED_NURSING_FACILITY): Payer: Medicare HMO | Admitting: Internal Medicine

## 2015-01-31 ENCOUNTER — Encounter: Payer: Self-pay | Admitting: Internal Medicine

## 2015-01-31 DIAGNOSIS — E038 Other specified hypothyroidism: Secondary | ICD-10-CM

## 2015-01-31 DIAGNOSIS — I5043 Acute on chronic combined systolic (congestive) and diastolic (congestive) heart failure: Secondary | ICD-10-CM

## 2015-01-31 DIAGNOSIS — K625 Hemorrhage of anus and rectum: Secondary | ICD-10-CM

## 2015-01-31 DIAGNOSIS — N184 Chronic kidney disease, stage 4 (severe): Secondary | ICD-10-CM

## 2015-01-31 LAB — GLUCOSE, CAPILLARY: GLUCOSE-CAPILLARY: 111 mg/dL — AB (ref 70–99)

## 2015-01-31 NOTE — Telephone Encounter (Signed)
Noted. Cardiology agrees but would prefer OR with anesthesia and CRNA monitoring due to comorbidities. Please notify patient and schedule procedure accordingly.

## 2015-01-31 NOTE — Progress Notes (Signed)
Patient ID: [name] [name], male   DOB: August 15, 1946, 69 y.o.   MRN: [id number]    This is a discharge note.  Level care skilled.  Facility [facility].  Chief complaint discharge note    HISTORY OF PRESENT ILLNESS:  This is a 69 year old male with a complicated medical history-he was hospitalized for abdominal issues thought to be aa calculus cholecystitis possibly pancreatitis-.    He also has severe aortic regurgitation.  [name] felt not to be a good candidate for surgery.  He has congestive heart failure secondary to this, and pulmonary hypertension, on chronic oxygen.  He also has a history of stage IV chronic renal failure    When he arrived in the building, he was on Lasix 120 mg three times a day and metolazone 10 mg a day.  His renal functions were gradually worsened on this regimen to a BUN of over 120 and a creatinine of over 3. His metolazone was reduced he is now on Lasix 100 mg a day renal function appears to be stable creatinine on lab done earlier this week was 2.58 which is his baseline .  His weights also appears stable today 243 this is actually down from the mid 240s higher 240s earlier in his stay.  Also had an episode of rectal bleeding he has been followed by GI and they have scheduled a colonoscopy-his hemoglobin this week is 9.5 this appears to be trending down from his baseline which appears to be roughly 11-12.  He does not report any increase shortness breath or weakness from baseline he is ambulating about the facility in his walker has actually done quite well  He will need PT and OT for strengthening as well as nursing and home health support-.  [name] a type II diabetic Accu-Cheks appear to be stable ranging from the high 90s to mid 100s.  Family medical social history reviewed per discharge summary on 12/19/2014.  Medications have been reviewed per MAR.    Marland Kitchen       REVIEW OF SYSTEMS: General no complaints of fever or chills.  Skin does not  complain of rashes or itching.  Head ears eyes nose mouth and throat does not complaining of sore throat or visual changes    CHEST/RESPIRATORY:  The patient states his shortness of breath is at the baseline.   CARDIAC:  No chest pain.   GI:  He has been to see Dr. Gala Romney with regards to his rectal bleeding and an outpatient colonoscopy will be arranged.  He has not had any further rectal bleeding that I am aware of,  Muscle skeletal has gained strength is ambulating with a walker does not complain of joint pain.  Neurologic is not complaining of syncope dizziness or headache.      PHYSICAL EXAMINATION:   VITAL SIGNS:   Temperature 98.4 pulse 80 respirations 20 blood pressure 157/49-107/53 in this range weight is 243    GENERAL APPEARANCE:  The patient looks in absolutely no distress. His skin is warm and dry he does have venous stasis changes lower legs   CHEST/RESPIRATORY:  Clear air entry bilaterally--no labored breathing.    CARDIOVASCULAR:   CARDIAC:  Regular rate and rhythm questionable slight murmur has venous stasis changes lower extremities     EDEMA/VARICOSITIES:  He has venous stasis with some resultant edema just above his ankles bilaterally.  There is no evidence of a DVT  Muscle skeletal-moves all extremities 4 baseline is able to stand without  assistance and ambulate with a walker Logic is grossly intact no lateralizing findings.  Psychshe appears grossly alert and oriented pleasant and appropriate.  Labs.  Feb 22nd 2016.  Sodium 133 potassium 4 BUN is pending creatinine 2.58.  WBC 5.4 hemoglobin 9.5 platelets 150.        .    ASSESSMENT/PLAN:    History of systolic CHF-at this point appears stable weight has been stable to actually improved currently on Lasix 100 mg 3 times a day and metolazone 5 mg a day-                    Chronic renal failure. This appears to be baseline Will need expedient follow-up he is followed by nephrology--continues on  sodium bicarbonate--will have home health redraw BMP next week notify PCP of results  .    Rectal bleeding.  His hemoglobin actually dropped from 11.9 to 9.5 over his stay here-this will again be evaluated by GI colonoscopy is scheduled. --Will have home health update CBC next week notify primary care provider of result .    Pulmonary hypertension.  On chronic oxygen.  This appears to be stable.  History of GI issues other than rectal bleeding this appears to be stable-has not been an issue here during his stay.                                                              History of diabetes type 2 this appears to be diet-controlled CBGs have been stable.     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        Marland Kitchen

## 2015-02-01 ENCOUNTER — Encounter (HOSPITAL_COMMUNITY)
Admission: RE | Admit: 2015-02-01 | Discharge: 2015-02-01 | Disposition: A | Discharge status: Home / Self Care | Payer: Medicare HMO | Source: Skilled Nursing Facility | Attending: Internal Medicine | Admitting: Internal Medicine

## 2015-02-01 ENCOUNTER — Encounter (HOSPITAL_COMMUNITY)
Admission: RE | Admit: 2015-02-01 | Discharge: 2015-02-01 | Disposition: A | Discharge status: Home / Self Care | Payer: Medicare HMO | Source: Ambulatory Visit | Attending: Internal Medicine | Admitting: Internal Medicine

## 2015-02-01 LAB — TSH: TSH: 0.206 u[IU]/mL — ABNORMAL LOW (ref 0.350–4.500)

## 2015-02-01 LAB — PHENYTOIN LEVEL, TOTAL: Phenytoin Lvl: 17.7 ug/mL (ref 10.0–20.0)

## 2015-02-01 NOTE — Telephone Encounter (Signed)
Tried to call with no answer  

## 2015-02-05 ENCOUNTER — Other Ambulatory Visit: Payer: Self-pay

## 2015-02-05 DIAGNOSIS — I129 Hypertensive chronic kidney disease with stage 1 through stage 4 chronic kidney disease, or unspecified chronic kidney disease: Secondary | ICD-10-CM | POA: Diagnosis not present

## 2015-02-05 DIAGNOSIS — N184 Chronic kidney disease, stage 4 (severe): Secondary | ICD-10-CM | POA: Diagnosis not present

## 2015-02-05 DIAGNOSIS — J449 Chronic obstructive pulmonary disease, unspecified: Secondary | ICD-10-CM | POA: Diagnosis not present

## 2015-02-05 DIAGNOSIS — I5042 Chronic combined systolic (congestive) and diastolic (congestive) heart failure: Secondary | ICD-10-CM | POA: Diagnosis not present

## 2015-02-05 MED ORDER — PEG-KCL-NACL-NASULF-NA ASC-C 100 G PO SOLR: 1.0000 | ORAL | Status: DC

## 2015-02-05 NOTE — Telephone Encounter (Signed)
Pt is set up for 02/15/15 @ 8:00 instructions are in the mail. [name] aware of appointment

## 2015-02-12 ENCOUNTER — Telehealth: Payer: Self-pay

## 2015-02-12 NOTE — Telephone Encounter (Signed)
Wife called and states that pt is under the weather and that he needs to cancel procedure that is arranged for 02/15/2015

## 2015-02-13 ENCOUNTER — Inpatient Hospital Stay (HOSPITAL_COMMUNITY): Admit: 2015-02-13 | Payer: Medicare HMO

## 2015-02-14 ENCOUNTER — Other Ambulatory Visit (HOSPITAL_COMMUNITY): Payer: Self-pay

## 2015-02-14 ENCOUNTER — Emergency Department (HOSPITAL_COMMUNITY): Payer: Medicare HMO

## 2015-02-14 ENCOUNTER — Inpatient Hospital Stay (HOSPITAL_COMMUNITY)
Admission: EM | Admit: 2015-02-14 | Discharge: 2015-02-19 | DRG: 190 | Disposition: A | Discharge status: Home / Self Care | Payer: Medicare HMO | Attending: Internal Medicine | Admitting: Internal Medicine

## 2015-02-14 ENCOUNTER — Encounter (HOSPITAL_COMMUNITY): Payer: Self-pay | Admitting: Emergency Medicine

## 2015-02-14 ENCOUNTER — Other Ambulatory Visit: Payer: Self-pay

## 2015-02-14 DIAGNOSIS — Z85528 Personal history of other malignant neoplasm of kidney: Secondary | ICD-10-CM

## 2015-02-14 DIAGNOSIS — Z9981 Dependence on supplemental oxygen: Secondary | ICD-10-CM | POA: Diagnosis not present

## 2015-02-14 DIAGNOSIS — G8929 Other chronic pain: Secondary | ICD-10-CM | POA: Diagnosis present

## 2015-02-14 DIAGNOSIS — E1122 Type 2 diabetes mellitus with diabetic chronic kidney disease: Secondary | ICD-10-CM

## 2015-02-14 DIAGNOSIS — I129 Hypertensive chronic kidney disease with stage 1 through stage 4 chronic kidney disease, or unspecified chronic kidney disease: Secondary | ICD-10-CM | POA: Diagnosis present

## 2015-02-14 DIAGNOSIS — G4730 Sleep apnea, unspecified: Secondary | ICD-10-CM | POA: Diagnosis present

## 2015-02-14 DIAGNOSIS — I5023 Acute on chronic systolic (congestive) heart failure: Secondary | ICD-10-CM | POA: Diagnosis present

## 2015-02-14 DIAGNOSIS — D444 Neoplasm of uncertain behavior of craniopharyngeal duct: Secondary | ICD-10-CM

## 2015-02-14 DIAGNOSIS — R059 Cough, unspecified: Secondary | ICD-10-CM

## 2015-02-14 DIAGNOSIS — R569 Unspecified convulsions: Secondary | ICD-10-CM

## 2015-02-14 DIAGNOSIS — Z8249 Family history of ischemic heart disease and other diseases of the circulatory system: Secondary | ICD-10-CM

## 2015-02-14 DIAGNOSIS — Z905 Acquired absence of kidney: Secondary | ICD-10-CM | POA: Diagnosis present

## 2015-02-14 DIAGNOSIS — J9620 Acute and chronic respiratory failure, unspecified whether with hypoxia or hypercapnia: Secondary | ICD-10-CM | POA: Diagnosis present

## 2015-02-14 DIAGNOSIS — Z7951 Long term (current) use of inhaled steroids: Secondary | ICD-10-CM | POA: Diagnosis not present

## 2015-02-14 DIAGNOSIS — E039 Hypothyroidism, unspecified: Secondary | ICD-10-CM | POA: Diagnosis present

## 2015-02-14 DIAGNOSIS — Z7982 Long term (current) use of aspirin: Secondary | ICD-10-CM | POA: Diagnosis not present

## 2015-02-14 DIAGNOSIS — R0602 Shortness of breath: Secondary | ICD-10-CM | POA: Diagnosis present

## 2015-02-14 DIAGNOSIS — Z87891 Personal history of nicotine dependence: Secondary | ICD-10-CM | POA: Diagnosis not present

## 2015-02-14 DIAGNOSIS — K219 Gastro-esophageal reflux disease without esophagitis: Secondary | ICD-10-CM | POA: Diagnosis present

## 2015-02-14 DIAGNOSIS — E785 Hyperlipidemia, unspecified: Secondary | ICD-10-CM | POA: Diagnosis present

## 2015-02-14 DIAGNOSIS — I10 Essential (primary) hypertension: Secondary | ICD-10-CM | POA: Diagnosis present

## 2015-02-14 DIAGNOSIS — J441 Chronic obstructive pulmonary disease with (acute) exacerbation: Secondary | ICD-10-CM | POA: Diagnosis not present

## 2015-02-14 DIAGNOSIS — Z794 Long term (current) use of insulin: Secondary | ICD-10-CM | POA: Diagnosis not present

## 2015-02-14 DIAGNOSIS — E871 Hypo-osmolality and hyponatremia: Secondary | ICD-10-CM | POA: Diagnosis present

## 2015-02-14 DIAGNOSIS — N189 Chronic kidney disease, unspecified: Secondary | ICD-10-CM

## 2015-02-14 DIAGNOSIS — E119 Type 2 diabetes mellitus without complications: Secondary | ICD-10-CM | POA: Diagnosis present

## 2015-02-14 DIAGNOSIS — Z79891 Long term (current) use of opiate analgesic: Secondary | ICD-10-CM | POA: Diagnosis not present

## 2015-02-14 DIAGNOSIS — R0603 Acute respiratory distress: Secondary | ICD-10-CM | POA: Diagnosis present

## 2015-02-14 DIAGNOSIS — D649 Anemia, unspecified: Secondary | ICD-10-CM | POA: Diagnosis present

## 2015-02-14 DIAGNOSIS — R05 Cough: Secondary | ICD-10-CM | POA: Diagnosis not present

## 2015-02-14 DIAGNOSIS — N184 Chronic kidney disease, stage 4 (severe): Secondary | ICD-10-CM | POA: Diagnosis present

## 2015-02-14 LAB — BRAIN NATRIURETIC PEPTIDE: B Natriuretic Peptide: 562 pg/mL — ABNORMAL HIGH (ref 0.0–100.0)

## 2015-02-14 LAB — BLOOD GAS, ARTERIAL
ACID-BASE EXCESS: 1.7 mmol/L (ref 0.0–2.0)
Bicarbonate: 25.2 mEq/L — ABNORMAL HIGH (ref 20.0–24.0)
Drawn by: 317771
O2 Content: 2 L/min
O2 SAT: 94.4 %
PATIENT TEMPERATURE: 37
TCO2: 23 mmol/L (ref 0–100)
pCO2 arterial: 36.1 mmHg (ref 35.0–45.0)
pH, Arterial: 7.459 — ABNORMAL HIGH (ref 7.350–7.450)
pO2, Arterial: 68.9 mmHg — ABNORMAL LOW (ref 80.0–100.0)

## 2015-02-14 LAB — CBC
HCT: 35.7 % — ABNORMAL LOW (ref 39.0–52.0)
HEMOGLOBIN: 11.2 g/dL — AB (ref 13.0–17.0)
MCH: 28.6 pg (ref 26.0–34.0)
MCHC: 31.4 g/dL (ref 30.0–36.0)
MCV: 91.3 fL (ref 78.0–100.0)
PLATELETS: 169 10*3/uL (ref 150–400)
RBC: 3.91 MIL/uL — AB (ref 4.22–5.81)
RDW: 18.4 % — ABNORMAL HIGH (ref 11.5–15.5)
WBC: 7 10*3/uL (ref 4.0–10.5)

## 2015-02-14 LAB — GLUCOSE, CAPILLARY: Glucose-Capillary: 140 mg/dL — ABNORMAL HIGH (ref 70–99)

## 2015-02-14 LAB — BASIC METABOLIC PANEL
Anion gap: 7 (ref 5–15)
BUN: 68 mg/dL — ABNORMAL HIGH (ref 6–23)
CHLORIDE: 98 mmol/L (ref 96–112)
CO2: 31 mmol/L (ref 19–32)
Calcium: 9 mg/dL (ref 8.4–10.5)
Creatinine, Ser: 2.23 mg/dL — ABNORMAL HIGH (ref 0.50–1.35)
GFR calc non Af Amer: 28 mL/min — ABNORMAL LOW (ref >90)
GFR, EST AFRICAN AMERICAN: 33 mL/min — AB (ref >90)
Glucose, Bld: 108 mg/dL — ABNORMAL HIGH (ref 70–99)
Potassium: 3.8 mmol/L (ref 3.5–5.1)
Sodium: 136 mmol/L (ref 135–145)

## 2015-02-14 LAB — TROPONIN I

## 2015-02-14 MED ORDER — LORATADINE 10 MG PO TABS
10.0000 mg | ORAL_TABLET | Freq: Every day | ORAL | Status: DC | PRN
  Administered 2015-02-15 – 2015-02-19 (×5): 10 mg via ORAL
  Filled 2015-02-14 (×6): qty 1

## 2015-02-14 MED ORDER — FUROSEMIDE 10 MG/ML IJ SOLN
20.0000 mg | Freq: Three times a day (TID) | INTRAMUSCULAR | Status: DC
  Administered 2015-02-14 – 2015-02-15 (×2): 20 mg via INTRAVENOUS
  Filled 2015-02-14 (×2): qty 2

## 2015-02-14 MED ORDER — LEVOTHYROXINE SODIUM 50 MCG PO TABS
175.0000 ug | ORAL_TABLET | Freq: Every day | ORAL | Status: DC
  Administered 2015-02-15 – 2015-02-19 (×5): 175 ug via ORAL
  Filled 2015-02-14 (×5): qty 4

## 2015-02-14 MED ORDER — PANTOPRAZOLE SODIUM 40 MG PO TBEC
40.0000 mg | DELAYED_RELEASE_TABLET | Freq: Every day | ORAL | Status: DC
  Administered 2015-02-14 – 2015-02-19 (×6): 40 mg via ORAL
  Filled 2015-02-14 (×6): qty 1

## 2015-02-14 MED ORDER — ALBUTEROL SULFATE (2.5 MG/3ML) 0.083% IN NEBU: 2.5000 mg | INHALATION_SOLUTION | RESPIRATORY_TRACT | Status: AC | PRN

## 2015-02-14 MED ORDER — HYDROCODONE-ACETAMINOPHEN 5-325 MG PO TABS: 1.0000 | ORAL_TABLET | Freq: Four times a day (QID) | ORAL | Status: DC | PRN

## 2015-02-14 MED ORDER — DESMOPRESSIN ACETATE 0.2 MG PO TABS
0.2000 mg | ORAL_TABLET | Freq: Two times a day (BID) | ORAL | Status: DC
  Administered 2015-02-15 – 2015-02-19 (×9): 0.2 mg via ORAL
  Filled 2015-02-14 (×13): qty 1

## 2015-02-14 MED ORDER — LEVALBUTEROL HCL 1.25 MG/0.5ML IN NEBU
1.2500 mg | INHALATION_SOLUTION | Freq: Four times a day (QID) | RESPIRATORY_TRACT | Status: DC
  Administered 2015-02-15 – 2015-02-19 (×17): 1.25 mg via RESPIRATORY_TRACT
  Filled 2015-02-14 (×17): qty 0.5

## 2015-02-14 MED ORDER — HEPARIN SODIUM (PORCINE) 5000 UNIT/ML IJ SOLN
5000.0000 [IU] | Freq: Three times a day (TID) | INTRAMUSCULAR | Status: DC
  Administered 2015-02-14 – 2015-02-19 (×13): 5000 [IU] via SUBCUTANEOUS
  Filled 2015-02-14 (×13): qty 1

## 2015-02-14 MED ORDER — PREDNISONE 20 MG PO TABS
40.0000 mg | ORAL_TABLET | Freq: Once | ORAL | Status: AC
Start: 2015-02-14 — End: 2015-02-14
  Administered 2015-02-14: 40 mg via ORAL
  Filled 2015-02-14: qty 2

## 2015-02-14 MED ORDER — CLONIDINE HCL 0.2 MG PO TABS
0.2000 mg | ORAL_TABLET | Freq: Three times a day (TID) | ORAL | Status: DC
  Administered 2015-02-14 – 2015-02-19 (×13): 0.2 mg via ORAL
  Filled 2015-02-14 (×14): qty 1

## 2015-02-14 MED ORDER — SODIUM CHLORIDE 0.9 % IV SOLN: 250.0000 mL | INTRAVENOUS | Status: DC | PRN

## 2015-02-14 MED ORDER — SENNA 8.6 MG PO TABS
1.0000 | ORAL_TABLET | Freq: Two times a day (BID) | ORAL | Status: DC
  Administered 2015-02-14 – 2015-02-19 (×10): 8.6 mg via ORAL
  Filled 2015-02-14 (×10): qty 1

## 2015-02-14 MED ORDER — IPRATROPIUM BROMIDE 0.02 % IN SOLN
0.5000 mg | Freq: Four times a day (QID) | RESPIRATORY_TRACT | Status: DC
  Administered 2015-02-15 – 2015-02-19 (×17): 0.5 mg via RESPIRATORY_TRACT
  Filled 2015-02-14 (×17): qty 2.5

## 2015-02-14 MED ORDER — METOLAZONE 5 MG PO TABS
5.0000 mg | ORAL_TABLET | Freq: Two times a day (BID) | ORAL | Status: DC
  Administered 2015-02-14 – 2015-02-15 (×3): 5 mg via ORAL
  Filled 2015-02-14 (×3): qty 1

## 2015-02-14 MED ORDER — IPRATROPIUM BROMIDE 0.02 % IN SOLN
0.5000 mg | RESPIRATORY_TRACT | Status: AC
  Administered 2015-02-14: 0.5 mg via RESPIRATORY_TRACT
  Filled 2015-02-14: qty 2.5

## 2015-02-14 MED ORDER — PHENYTOIN SODIUM EXTENDED 100 MG PO CAPS: 200.0000 mg | ORAL_CAPSULE | Freq: Every day | ORAL | Status: DC

## 2015-02-14 MED ORDER — POLYETHYLENE GLYCOL 3350 17 G PO PACK: 17.0000 g | PACK | Freq: Every day | ORAL | Status: DC | PRN

## 2015-02-14 MED ORDER — IPRATROPIUM BROMIDE 0.02 % IN SOLN
0.5000 mg | RESPIRATORY_TRACT | Status: DC
  Administered 2015-02-14: 0.5 mg via RESPIRATORY_TRACT
  Filled 2015-02-14: qty 2.5

## 2015-02-14 MED ORDER — ONDANSETRON HCL 4 MG PO TABS
4.0000 mg | ORAL_TABLET | Freq: Four times a day (QID) | ORAL | Status: DC | PRN
  Filled 2015-02-14: qty 1

## 2015-02-14 MED ORDER — ATORVASTATIN CALCIUM 40 MG PO TABS
40.0000 mg | ORAL_TABLET | Freq: Every day | ORAL | Status: DC
  Administered 2015-02-14 – 2015-02-18 (×5): 40 mg via ORAL
  Filled 2015-02-14 (×5): qty 1

## 2015-02-14 MED ORDER — PHENYTOIN SODIUM EXTENDED 100 MG PO CAPS
200.0000 mg | ORAL_CAPSULE | ORAL | Status: DC
  Administered 2015-02-15 – 2015-02-19 (×2): 200 mg via ORAL
  Filled 2015-02-14 (×2): qty 2

## 2015-02-14 MED ORDER — INSULIN ASPART 100 UNIT/ML ~~LOC~~ SOLN
0.0000 [IU] | Freq: Three times a day (TID) | SUBCUTANEOUS | Status: DC
  Administered 2015-02-15: 2 [IU] via SUBCUTANEOUS
  Administered 2015-02-15: 1 [IU] via SUBCUTANEOUS
  Administered 2015-02-15: 2 [IU] via SUBCUTANEOUS
  Administered 2015-02-16: 1 [IU] via SUBCUTANEOUS
  Administered 2015-02-16 – 2015-02-17 (×2): 2 [IU] via SUBCUTANEOUS
  Administered 2015-02-18: 1 [IU] via SUBCUTANEOUS

## 2015-02-14 MED ORDER — ONDANSETRON HCL 4 MG/2ML IJ SOLN: 4.0000 mg | Freq: Four times a day (QID) | INTRAMUSCULAR | Status: DC | PRN

## 2015-02-14 MED ORDER — LEVOFLOXACIN IN D5W 750 MG/150ML IV SOLN
750.0000 mg | Freq: Once | INTRAVENOUS | Status: AC
  Administered 2015-02-14: 750 mg via INTRAVENOUS
  Filled 2015-02-14: qty 150

## 2015-02-14 MED ORDER — HYDRALAZINE HCL 20 MG/ML IJ SOLN: 5.0000 mg | INTRAMUSCULAR | Status: DC | PRN

## 2015-02-14 MED ORDER — SODIUM CHLORIDE 0.9 % IJ SOLN
3.0000 mL | Freq: Two times a day (BID) | INTRAMUSCULAR | Status: DC
  Administered 2015-02-14 – 2015-02-19 (×6): 3 mL via INTRAVENOUS

## 2015-02-14 MED ORDER — SODIUM CHLORIDE 0.9 % IJ SOLN: 3.0000 mL | INTRAMUSCULAR | Status: DC | PRN

## 2015-02-14 MED ORDER — ONDANSETRON HCL 4 MG/2ML IJ SOLN: 4.0000 mg | Freq: Three times a day (TID) | INTRAMUSCULAR | Status: DC | PRN

## 2015-02-14 MED ORDER — ALBUTEROL (5 MG/ML) CONTINUOUS INHALATION SOLN
10.0000 mg/h | INHALATION_SOLUTION | RESPIRATORY_TRACT | Status: AC
  Administered 2015-02-14: 10 mg/h via RESPIRATORY_TRACT
  Filled 2015-02-14: qty 20

## 2015-02-14 MED ORDER — LEVALBUTEROL HCL 1.25 MG/0.5ML IN NEBU
1.2500 mg | INHALATION_SOLUTION | Freq: Four times a day (QID) | RESPIRATORY_TRACT | Status: DC
  Administered 2015-02-14: 1.25 mg via RESPIRATORY_TRACT
  Filled 2015-02-14: qty 0.5

## 2015-02-14 MED ORDER — PHENYTOIN SODIUM EXTENDED 100 MG PO CAPS
300.0000 mg | ORAL_CAPSULE | ORAL | Status: DC
  Administered 2015-02-14 – 2015-02-18 (×4): 300 mg via ORAL
  Filled 2015-02-14 (×4): qty 3

## 2015-02-14 MED ORDER — METHYLPREDNISOLONE SODIUM SUCC 125 MG IJ SOLR
60.0000 mg | Freq: Four times a day (QID) | INTRAMUSCULAR | Status: DC
  Administered 2015-02-15 – 2015-02-16 (×6): 60 mg via INTRAVENOUS
  Filled 2015-02-14 (×5): qty 2

## 2015-02-14 MED ORDER — SODIUM CHLORIDE 0.9 % IJ SOLN
3.0000 mL | Freq: Two times a day (BID) | INTRAMUSCULAR | Status: DC
  Administered 2015-02-14 – 2015-02-16 (×5): 3 mL via INTRAVENOUS

## 2015-02-14 NOTE — ED Notes (Signed)
Report called to Santiago Glad, RN  on Dept 300. All questions answered.

## 2015-02-14 NOTE — ED Notes (Signed)
Patient states he feels too SOB to attempt ambulation. Dyspnea at rest observed. O2 stats 94% on 2L/[place]. Will inform EDP.
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
## 2015-02-14 NOTE — ED Notes (Signed)
Onset 2 days ago, dry cough, sob

## 2015-02-14 NOTE — ED Notes (Signed)
Pt doing neb treatments at home without relief

## 2015-02-15 ENCOUNTER — Encounter (HOSPITAL_COMMUNITY): Admission: RE | Payer: Self-pay | Source: Ambulatory Visit

## 2015-02-15 ENCOUNTER — Ambulatory Visit (HOSPITAL_COMMUNITY): Admission: RE | Admit: 2015-02-15 | Payer: Medicare HMO | Source: Ambulatory Visit | Admitting: Internal Medicine

## 2015-02-15 LAB — BASIC METABOLIC PANEL
Anion gap: 10 (ref 5–15)
BUN: 65 mg/dL — ABNORMAL HIGH (ref 6–23)
CO2: 26 mmol/L (ref 19–32)
CREATININE: 2.39 mg/dL — AB (ref 0.50–1.35)
Calcium: 9.1 mg/dL (ref 8.4–10.5)
Chloride: 99 mmol/L (ref 96–112)
GFR calc Af Amer: 30 mL/min — ABNORMAL LOW (ref >90)
GFR calc non Af Amer: 26 mL/min — ABNORMAL LOW (ref >90)
Glucose, Bld: 141 mg/dL — ABNORMAL HIGH (ref 70–99)
Potassium: 4.2 mmol/L (ref 3.5–5.1)
SODIUM: 135 mmol/L (ref 135–145)

## 2015-02-15 LAB — CBC
HEMATOCRIT: 34.7 % — AB (ref 39.0–52.0)
Hemoglobin: 11 g/dL — ABNORMAL LOW (ref 13.0–17.0)
MCH: 28.8 pg (ref 26.0–34.0)
MCHC: 31.7 g/dL (ref 30.0–36.0)
MCV: 90.8 fL (ref 78.0–100.0)
PLATELETS: 172 10*3/uL (ref 150–400)
RBC: 3.82 MIL/uL — AB (ref 4.22–5.81)
RDW: 18.4 % — ABNORMAL HIGH (ref 11.5–15.5)
WBC: 6.7 10*3/uL (ref 4.0–10.5)

## 2015-02-15 LAB — GLUCOSE, CAPILLARY
Glucose-Capillary: 153 mg/dL — ABNORMAL HIGH (ref 70–99)
Glucose-Capillary: 180 mg/dL — ABNORMAL HIGH (ref 70–99)
Glucose-Capillary: 124 mg/dL — ABNORMAL HIGH (ref 70–99)
Glucose-Capillary: 168 mg/dL — ABNORMAL HIGH (ref 70–99)

## 2015-02-15 LAB — MRSA PCR SCREENING: MRSA by PCR: POSITIVE — AB

## 2015-02-15 SURGERY — COLONOSCOPY WITH PROPOFOL
Anesthesia: Monitor Anesthesia Care

## 2015-02-15 MED ORDER — CETYLPYRIDINIUM CHLORIDE 0.05 % MT LIQD
7.0000 mL | Freq: Two times a day (BID) | OROMUCOSAL | Status: DC
  Administered 2015-02-15 – 2015-02-19 (×9): 7 mL via OROMUCOSAL

## 2015-02-15 MED ORDER — MUPIROCIN 2 % EX OINT
1.0000 | TOPICAL_OINTMENT | Freq: Two times a day (BID) | CUTANEOUS | Status: DC
Start: 2015-02-15 — End: 2015-02-19
  Administered 2015-02-15 – 2015-02-19 (×9): 1 via NASAL
  Filled 2015-02-15: qty 22

## 2015-02-15 MED ORDER — CHLORHEXIDINE GLUCONATE CLOTH 2 % EX PADS
6.0000 | MEDICATED_PAD | Freq: Every day | CUTANEOUS | Status: AC
  Administered 2015-02-15 – 2015-02-19 (×5): 6 via TOPICAL

## 2015-02-15 MED ORDER — FUROSEMIDE 40 MG PO TABS
40.0000 mg | ORAL_TABLET | Freq: Every day | ORAL | Status: DC
  Administered 2015-02-15: 40 mg via ORAL
  Filled 2015-02-15: qty 1

## 2015-02-15 NOTE — Consult Note (Signed)
Consult requested by: Dr. Legrand Rams Consult requested for COPD exacerbation acute on chronic respiratory failure sleep apnea:  HPI: This is a 69 year old who has a long known history of multiple medical problems including COPD on home oxygen congestive heart failure chronic kidney disease panhypopituitarism seizure disorder and diabetes. He had a sleep study done in 2012 that was read as severe sleep apnea. However he is not using C Pap and is not quite sure if he still has a sleep apnea machine or not. He came to the emergency department with increasing shortness of breath. He was felt to have respiratory issues because of combined COPD sleep apnea and heart failure. He had been in a skilled care facility and was treated for congestive heart failure with remarkable improvement in his chronic edema. He says he feels much better this morning than he did yesterday. [name] not coughing much. He's not sure if he would agree to try C Pap again or not  Past Medical History  Diagnosis Date  . Chronic obstructive pulmonary disease     Chronic bronchitis;  home oxygen; multiple exacerbations  . Hypertension   . Cellulitis of lower leg   . Sleep apnea     Severe on a sleep study in 12/2010  . Hypothyroid     10/2002: TSH-0.43, T4-0.77  . Tobacco abuse, in remission     20 pack years; discontinued 1998  . Hyperlipidemia     No lipid profile available  . 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 Morbid obesity with BMI of 50.0-59.9, adult 10/28/2012  . Seizure disorder     Onset after craniotomy  . Aortic valve disease 10/2012    Prominent diastolic murmur; mild to moderate aortic insufficiency on echocardiogram in 10/2012  . Chronic kidney disease     S/P right nephrectomy for hypernephroma in 2010  . Seizures   . CHF (congestive heart failure)   . Diabetes mellitus without complication      Family  History  Problem Relation Age of Onset  . Cancer Mother   . Cancer Father   . Cancer Sister   . Heart failure Sister   . Cancer Brother   . Colon cancer Neg Hx      History   Social History  . Marital Status: Single    Spouse Name: N/A  . Number of Children: 1  . Years of Education: N/A   Occupational History  . Retired     Engineer, manufacturing systems   Social History Main Topics  . Smoking status: Former Smoker -- 1.00 packs/day    Types: Cigarettes    Start date: 11/20/1960    Quit date: 11/16/1989  . Smokeless tobacco: Never Used  . Alcohol Use: No  . Drug Use: No  . Sexual Activity: Not on file   Other Topics Concern  . None   Social History Narrative   Lives in [place] alone with good family support from his daughter.     ROS: He's not had any chest pain. No hemoptysis no abdominal pain nausea or vomiting. The rest is per the history and physical    Objective: Vital signs in last 24 hours: Temp:  [98.6 F (37 C)-99.2 F (37.3 C)] 98.8 F (37.1 C) (03/10 0553) Pulse Rate:  [98-124] 99 (03/10 0553) Resp:  [17-22] 21 (03/10 0553) BP: (128-180)/(47-66) 162/61 mmHg (03/10 0553) SpO2:  [93 %-99 %] 99 % (  03/10 0553) Weight:  [104.872 kg (231 lb 3.2 oz)-108.863 kg (240 lb)] 104.872 kg (231 lb 3.2 oz) (03/10 0553) Weight change:  Last BM Date: 02/13/15  Intake/Output from previous day: 03/09 0701 - 03/10 0700 In: 3 [I.V.:3] Out: 700 [Urine:700]  PHYSICAL EXAM [name] awake and alert. His pupils are reactive nose and throat clear mucous membranes are moist he does not have any JVD when he sitting upright. His chest shows rhonchi wheezes bilaterally and some rales in the bases. His heart is regular and I do not hear an S3 gallop. His abdomen is soft without masses. His extremities have the least edema the liver seen on him now it probably trace to 1+. Central nervous system examination is grossly intact  Lab Results: Basic Metabolic Panel:  Recent Labs   02/14/15 1630 02/15/15 0649  NA 136 135  K 3.8 4.2  CL 98 99  CO2 31 26  GLUCOSE 108* 141*  BUN 68* 65*  CREATININE 2.23* 2.39*  CALCIUM 9.0 9.1   Liver Function Tests: No results for input(s): AST, ALT, ALKPHOS, BILITOT, PROT, ALBUMIN in the last 72 hours. No results for input(s): LIPASE, AMYLASE in the last 72 hours. No results for input(s): AMMONIA in the last 72 hours. CBC:  Recent Labs  02/14/15 1630 02/15/15 0649  WBC 7.0 6.7  HGB 11.2* 11.0*  HCT 35.7* 34.7*  MCV 91.3 90.8  PLT 169 172   Cardiac Enzymes:  Recent Labs  02/14/15 1630  TROPONINI <0.03   BNP: No results for input(s): PROBNP in the last 72 hours. D-Dimer: No results for input(s): DDIMER in the last 72 hours. CBG:  Recent Labs  02/14/15 2147 02/15/15 0737  GLUCAP 140* 153*   Hemoglobin A1C: No results for input(s): HGBA1C in the last 72 hours. Fasting Lipid Panel: No results for input(s): CHOL, HDL, LDLCALC, TRIG, CHOLHDL, LDLDIRECT in the last 72 hours. Thyroid Function Tests: No results for input(s): TSH, T4TOTAL, FREET4, T3FREE, THYROIDAB in the last 72 hours. Anemia Panel: No results for input(s): VITAMINB12, FOLATE, FERRITIN, TIBC, IRON, RETICCTPCT in the last 72 hours. Coagulation: No results for input(s): LABPROT, INR in the last 72 hours. Urine Drug Screen: Drugs of Abuse  No results found for: LABOPIA, COCAINSCRNUR, LABBENZ, AMPHETMU, THCU, LABBARB  Alcohol Level: No results for input(s): ETH in the last 72 hours. Urinalysis: No results for input(s): COLORURINE, LABSPEC, PHURINE, GLUCOSEU, HGBUR, BILIRUBINUR, KETONESUR, PROTEINUR, UROBILINOGEN, NITRITE, LEUKOCYTESUR in the last 72 hours.  Invalid input(s): APPERANCEUR Misc. Labs:   ABGS:  Recent Labs  02/14/15 2040  PHART 7.459*  PO2ART 68.9*  TCO2 23.0  HCO3 25.2*     MICROBIOLOGY: Recent Results (from the past 240 hour(s))  MRSA PCR Screening     Status: Abnormal   Collection Time: 02/15/15  2:40 AM   Result Value Ref Range Status   MRSA by PCR POSITIVE (A) NEGATIVE Final    Comment:        The GeneXpert MRSA Assay (FDA approved for NASAL specimens only), is one component of a comprehensive MRSA colonization surveillance program. It is not intended to diagnose MRSA infection nor to guide or monitor treatment for MRSA infections. RESULT CALLED TO, READ BACK BY AND VERIFIED WITH: WOODS K AT 0414 ON [id number] BY Mikel Cella K     Studies/Results: Dg Chest Port 1 View  02/14/2015   CLINICAL DATA:  Shortness of breath, cough  EXAM: PORTABLE CHEST - 1 VIEW  COMPARISON:  01/13/2015  FINDINGS: Cardiomegaly again noted.  No acute infiltrate or pleural effusion. No pulmonary edema. Mild degenerative changes thoracic spine.  IMPRESSION: Cardiomegaly.  No active disease.   Electronically Signed   By: Lahoma Crocker M.D.   On: 02/14/2015 16:26    Medications:  Prior to Admission:  Prescriptions prior to admission  Medication Sig Dispense Refill Last Dose  . albuterol (PROVENTIL) (2.5 MG/3ML) 0.083% nebulizer solution Take 3 mLs (2.5 mg total) by nebulization every 4 (four) hours as needed for wheezing or shortness of breath. 75 mL 12 02/13/2015 at Unknown time  . aspirin EC 325 MG EC tablet Take 1 tablet (325 mg total) by mouth daily. 30 tablet 0 02/13/2015 at Unknown time  . budesonide-formoterol (SYMBICORT) 160-4.5 MCG/ACT inhaler Inhale 2 puffs into the lungs 2 (two) times daily.   02/13/2015 at Unknown time  . cloNIDine (CATAPRES) 0.2 MG tablet Take 1 tablet (0.2 mg total) by mouth 3 (three) times daily. 90 tablet 3 02/13/2015 at Unknown time  . desmopressin (DDAVP) 0.2 MG tablet Take 1 tablet (0.2 mg total) by mouth 2 (two) times daily. 60 tablet 3 02/13/2015 at Unknown time  . dexamethasone (DECADRON) 0.5 MG tablet Take 0.5 mg by mouth daily.   02/13/2015 at Unknown time  . docusate sodium (COLACE) 100 MG capsule Take 100 mg by mouth 2 (two) times daily.   02/13/2015 at Unknown time  . ipratropium (ATROVENT)  0.02 % nebulizer solution Take 0.5 mg by nebulization 4 (four) times daily.   02/14/2015 at Unknown time  . levothyroxine (SYNTHROID, LEVOTHROID) 175 MCG tablet Take 175 mcg by mouth daily before breakfast.   02/14/2015 at Unknown time  . metolazone (ZAROXOLYN) 10 MG tablet Take 1 tablet (10 mg total) by mouth daily. (Patient taking differently: Take 5 mg by mouth 2 (two) times daily. )   02/13/2015 at Unknown time  . mometasone (NASONEX) 50 MCG/ACT nasal spray Place 2 sprays into the nose daily. 17 g 12 02/13/2015 at Unknown time  . omeprazole (PRILOSEC) 20 MG capsule Take 20 mg by mouth daily.   02/13/2015 at Unknown time  . phenytoin (DILANTIN) 100 MG ER capsule Take 200-300 mg by mouth daily. Takes 200 mg on Monday and Thursdsay. On all other days of the week takes 300 mg.   02/13/2015 at Unknown time  . polyethylene glycol powder (GLYCOLAX/MIRALAX) powder Take 17 g by mouth daily. May decrease to 17 g three times a week if needed. (Patient taking differently: Take 17 g by mouth daily. May decrease to 17 g three times a week if needed.) 255 g 3 Past Week at Unknown time  . simvastatin (ZOCOR) 80 MG tablet Take 80 mg by mouth daily.    02/13/2015 at Unknown time  . sodium bicarbonate 650 MG tablet Take 1 tablet (650 mg total) by mouth 2 (two) times daily.   02/13/2015 at Unknown time  . camphor-menthol (SARNA) lotion Apply topically as needed for itching. (Patient taking differently: Apply 1 application topically as needed for itching. ) 222 mL 0 unknown at Unknown time  . furosemide (LASIX) 40 MG tablet Take 3 tablets (120 mg total) by mouth 3 (three) times daily with meals. (Patient not taking: Reported on 02/14/2015) 30 tablet  Taking  . HYDROcodone-acetaminophen (NORCO/VICODIN) 5-325 MG per tablet Take two tablets by mouth every 6 hours as needed for pain. Max APAP 3gm/24hr from all sources (Patient taking differently: Take 2 tablets by mouth every 6 (six) hours as needed. Take two tablets by mouth every 6 hours as  needed for pain. Max APAP 3gm/24hr from all sources) 240 tablet 0 unknown  . hydrocortisone (PROCTOSOL HC) 2.5 % rectal cream Place 1 application rectally 2 (two) times daily. Do not use for longer than 7 days at a time. 30 g 0 unknown  . insulin aspart (NOVOLOG) 100 UNIT/ML injection Inject 0-9 Units into the skin 3 (three) times daily with meals. (Patient not taking: Reported on 01/24/2015) 10 mL 11 Not Taking  . loratadine (CLARITIN) 10 MG tablet Take 10 mg by mouth daily as needed for allergies.    unknown  . ondansetron (ZOFRAN) 4 MG tablet Take 1 tablet (4 mg total) by mouth every 8 (eight) hours as needed for nausea or vomiting. 20 tablet 0 unknown  . peg 3350 powder (MOVIPREP) 100 G SOLR Take 1 kit (200 g total) by mouth as directed. 1 kit 0 unknown   Scheduled: . antiseptic oral rinse  7 mL Mouth Rinse BID  . atorvastatin  40 mg Oral q1800  . Chlorhexidine Gluconate Cloth  6 each Topical Q0600  . cloNIDine  0.2 mg Oral TID  . desmopressin  0.2 mg Oral BID  . furosemide  40 mg Oral Daily  . heparin  5,000 Units Subcutaneous 3 times per day  . insulin aspart  0-9 Units Subcutaneous TID WC  . ipratropium  0.5 mg Nebulization QID  . levalbuterol  1.25 mg Nebulization QID  . levothyroxine  175 mcg Oral QAC breakfast  . methylPREDNISolone (SOLU-MEDROL) injection  60 mg Intravenous Q6H  . metolazone  5 mg Oral BID  . mupirocin ointment  1 application Nasal BID  . pantoprazole  40 mg Oral Daily  . phenytoin  200 mg Oral Once per day on Mon Thu   And  . phenytoin  300 mg Oral Once per day on Sun Tue Wed Fri Sat  . senna  1 tablet Oral BID  . sodium chloride  3 mL Intravenous Q12H  . sodium chloride  3 mL Intravenous Q12H   Continuous:  JQD:UKRCVK chloride, hydrALAZINE, HYDROcodone-acetaminophen, loratadine, ondansetron **OR** ondansetron (ZOFRAN) IV, polyethylene glycol, sodium chloride  Assesment: He has acute shortness of breath/respiratory failure that I think is multifactorial.  Think he is having a COPD exacerbation I think his heart failure is worse. [name] on steroids which I think is appropriate. I don't think he necessarily needs to be on an antibiotic because he has not had sputum production. [name] receiving diuretics. He says he feels much better. He has sleep apnea and needs to be on C Pap but he is not sure at this time if he would agree to try that again. I will discuss it with him again Principal Problem:   Shortness of breath Active Problems:   Hypertension   Hypothyroid   Diabetes   SOB (shortness of breath)   Respiratory distress   COPD exacerbation   Acute on chronic systolic CHF (congestive heart failure)   Seizures   Craniopharyngioma   Essential hypertension    Plan: Continue current treatments. Restart C Pap if he will agree    LOS: 1 day   Antionne Enrique L 02/15/2015, 9:03 AM
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
## 2015-02-15 NOTE — Clinical Social Work Note (Signed)
CSW received referral for COPD gold protocol. Pt has not met criteria of 3 hospital admissions in past 6 months. CSW will sign off, but can be reconsulted if needed.  [name] [name], [place]

## 2015-02-15 NOTE — Progress Notes (Signed)
UR chart review completed.  

## 2015-02-15 NOTE — Care Management Note (Addendum)
    Page 1 of 2   02/19/2015     10:29:18 AM CARE MANAGEMENT NOTE 02/19/2015  Patient:  [name] [name], [name] [name]   Account Number:  [id number]  Date Initiated:  02/15/2015  Documentation initiated by:  Theophilus Kinds  Subjective/Objective Assessment:   Pt admitted from home with COPD/CHF. Pt lives alone and will return home at discharge. Pt has home O2 and neb machine in place. Pt is active with [facility] RN PT and OT. Pt has a walker in place at home.     Action/Plan:   Will arrange resumption of HH at discharge.   Anticipated DC Date:  02/19/2015   Anticipated DC Plan:  [place]  CM consult      [facility] Choice  Resumption Of Svcs/PTA Provider   Choice offered to / List presented to:  C-1 Patient   DME arranged  CPAP      DME agency  [place] arranged  HH-1 RN  [place].   Status of service:  Completed, signed off Medicare Important Message given?  YES (If response is "NO", the following Medicare IM given date fields will be blank) Date Medicare IM given:  02/16/2015 Medicare IM given by:  Christinia Gully C Date Additional Medicare IM given:  02/19/2015 Additional Medicare IM given by:  Theophilus Kinds  Discharge Disposition:  [place]  Per UR Regulation:    If discussed at Long Length of Stay Meetings, dates discussed:    Comments:  02/19/15 [place], RN BSN CM Pt discharged home today with resumption of [facility] RN and PT (pt choice). Romualdo Bolk of [facility] is aware and will collect the pts information from the chart. [place] services to resume within 48 hours. Cpap order faxed to [facility] and will be delivered to pts home this pm. Pt and pts nurse aware of discharge arrangements.  02/16/15 [place], RN BSN CM Anticipate discharge over the weekend. Weekend staff to call [facility] and notify them of discharge. [place] services to resume  within 48 hours of discharge. Dr Luan Pulling arranging cpap for pt. Pt and pts nurse aware of discharge arrangements.  02/15/15 [place], RN BSN CM

## 2015-02-15 NOTE — Evaluation (Signed)
Physical Therapy Evaluation Patient Details Name: [name] [name] MRN: [id number] DOB: 1946-08-26 Today's Date: 02/15/2015   History of Present Illness  Patient is a 69 yo male admitted with shortness of breath. Started 2 days ago. Gradual onset. Symptoms are constant. Getting worse. Associated with wheezing, cough. Albuterol without significant improvement. Baseline home O2 of 2 L during daytime and nighttime. Increasing orthopnea requiring pt to sleep in chair. LE swelling at baseline. Denies fevers, CP, palpitations, abd pain (except when coughing in diaphragm area), back pain, dysuria, seizures.   Clinical Impression  Pt was recently at the [facility] for rehab.  [name] now at home, lives alone.  Currently, he is close to functional baseline.  [name] receiving HHPT.  He has no other PT needs.    Follow Up Recommendations Home health PT    Equipment Recommendations  None recommended by PT    Recommendations for Other Services  none    Precautions / Restrictions Precautions Precautions: None Restrictions Weight Bearing Restrictions: No      Mobility  Bed Mobility Overal bed mobility: Modified Independent                Transfers Overall transfer level: Modified independent                  Ambulation/Gait Ambulation/Gait assistance: Modified independent (Device/Increase time) Ambulation Distance (Feet): 150 Feet Assistive device: None Gait Pattern/deviations: WFL(Within Functional Limits) Gait velocity: pace is appropriate for energy conservation Gait velocity interpretation: Below normal speed for age/gender    Stairs            Wheelchair Mobility    Modified Rankin (Stroke Patients Only)       Balance Overall balance assessment: No apparent balance deficits (not formally assessed)                                           Pertinent Vitals/Pain Pain Assessment: No/denies pain    Home Living Family/patient expects to be  discharged to:: Private residence Living Arrangements: Alone Available Help at Discharge: Family;Available PRN/intermittently Type of Home: House Home Access: Stairs to enter Entrance Stairs-Rails: None Entrance Stairs-Number of Steps: 2 Home Layout: One level Home Equipment: Walker - 2 wheels;Cane - single point      Prior Function Level of Independence: Independent         Comments: Does not drive     Hand Dominance   Dominant Hand: Right    Extremity/Trunk Assessment   Upper Extremity Assessment: Defer to OT evaluation           Lower Extremity Assessment: Overall WFL for tasks assessed         Communication   Communication: No difficulties  Cognition Arousal/Alertness: Awake/alert Behavior During Therapy: WFL for tasks assessed/performed Overall Cognitive Status: Within Functional Limits for tasks assessed                      General Comments      Exercises        Assessment/Plan    PT Assessment All further PT needs can be met in the next venue of care  PT Diagnosis     PT Problem List Decreased activity tolerance  PT Treatment Interventions     PT Goals (Current goals can be found in the Care Plan section) Acute Rehab PT Goals PT Goal Formulation:  All assessment and education complete, DC therapy    Frequency     Barriers to discharge        Co-evaluation               End of Session Equipment Utilized During Treatment: Gait belt Activity Tolerance: Patient tolerated treatment well Patient left: in chair;with call bell/phone within reach           Time: 2767-0110 PT Time Calculation (min) (ACUTE ONLY): 20 min   Charges:   PT Evaluation $Initial PT Evaluation Tier I: 1 Procedure     PT G CodesDemetrios Isaacs L 02/15/2015, 2:24 PM

## 2015-02-15 NOTE — Evaluation (Signed)
Occupational Therapy Evaluation Patient Details Name: MERVIL WACKER MRN: [id number] DOB: 1946-06-22 Today's Date: 02/15/2015    History of Present Illness Patient is a 69 yo male admitted with shortness of breath. Started 2 days ago. Gradual onset. Symptoms are constant. Getting worse. Associated with wheezing, cough. Albuterol without significant improvement. Baseline home O2 of 2 L during daytime and nighttime. Increasing orthopnea requiring pt to sleep in chair. LE swelling at baseline. Denies fevers, CP, palpitations, abd pain (except when coughing in diaphragm area), back pain, dysuria, seizures.    Clinical Impression   PTA pt lived at home alone, with family checking in on him daily. Pt awake, alert, and sitting up in chair upon OTR entering room. Pt demonstrates WNL range of motion and good strength (5/5). Pt independent in lower body dressing tasks. Pt reports independence in ADL tasks at home, has some difficulty with meal preparation due to SOB and fatigue/decreased endurance. Educated pt on energy conservation strategies to use during ADL and functional mobility tasks. No skilled OT services required at this time.     Follow Up Recommendations  No OT follow up;Supervision - Intermittent    Equipment Recommendations  3 in 1 bedside comode (Pt expressed interest in obtaining bedside commode )       Precautions / Restrictions Precautions Precautions: Fall Restrictions Weight Bearing Restrictions: No              ADL Overall ADL's : Modified independent Eating/Feeding: Independent                   Lower Body Dressing: Independent                 General ADL Comments: Pt reports he is independent in all ADLs/IADLs, does have difficulty with cooking/meal preparation due to fatigue     Vision Vision Assessment?: Yes Eye Alignment: Within Functional Limits Ocular Range of Motion: Within Functional Limits Alignment/Gaze Preference: Within Defined  Limits Tracking/Visual Pursuits: Able to track stimulus in all quads without difficulty Saccades: Within functional limits Convergence: Within functional limits          Pertinent Vitals/Pain Pain Assessment: No/denies pain     Hand Dominance Right   Extremity/Trunk Assessment Upper Extremity Assessment Upper Extremity Assessment: Overall WFL for tasks assessed   Lower Extremity Assessment Lower Extremity Assessment: Defer to PT evaluation       Communication Communication Communication: No difficulties   Cognition Arousal/Alertness: Awake/alert Behavior During Therapy: WFL for tasks assessed/performed Overall Cognitive Status: Within Functional Limits for tasks assessed                                Home Living Family/patient expects to be discharged to:: Private residence Living Arrangements: Alone Available Help at Discharge: Family;Available PRN/intermittently               Bathroom Shower/Tub: Tub/shower unit Shower/tub characteristics: Curtain Biochemist, clinical: Standard     Home Equipment: Environmental consultant - 2 wheels;Cane - single point          Prior Functioning/Environment Level of Independence: Independent        Comments: Does not drive    OT Diagnosis: Other (comment) (Decreased endurance)   OT Problem List: Cardiopulmonary status limiting activity    End of Session    Activity Tolerance: Patient tolerated treatment well Patient left: in chair;with call bell/phone within reach   Time: 0812-0840 OT Time Calculation (min): 28  min Charges:  OT General Charges $OT Visit: 1 Procedure OT Evaluation $Initial OT Evaluation Tier I: 1 Procedure  Guadelupe Sabin, OTR/L [phone]  02/15/2015, 9:16 AM

## 2015-02-15 NOTE — Progress Notes (Signed)
Subjective: Patient was admitted yesterday due to shortness of breath secondary to acute exacerbation of COPD. He has been taking his nebulizer treatment and maintenance oral steroid.  Objective: Vital signs in last 24 hours: Temp:  [98.6 F (37 C)-99.2 F (37.3 C)] 98.8 F (37.1 C) (03/10 0553) Pulse Rate:  [98-124] 99 (03/10 0553) Resp:  [17-22] 21 (03/10 0553) BP: (128-180)/(47-66) 162/61 mmHg (03/10 0553) SpO2:  [93 %-99 %] 99 % (03/10 0553) Weight:  [104.872 kg (231 lb 3.2 oz)-108.863 kg (240 lb)] 104.872 kg (231 lb 3.2 oz) (03/10 0553) Weight change:  Last BM Date: 02/13/15  Intake/Output from previous day: 03/09 0701 - 03/10 0700 In: 3 [I.V.:3] Out: 700 [Urine:700]  PHYSICAL EXAM General appearance: alert, no distress and moderately obese Resp: diminished breath sounds bilaterally and rhonchi bilaterally Cardio: S1, S2 normal GI: soft, non-tender; bowel sounds normal; no masses,  no organomegaly Extremities: extremities normal, atraumatic, no cyanosis or edema  Lab Results:  Results for orders placed or performed during the hospital encounter of 02/14/15 (from the past 48 hour(s))  CBC     Status: Abnormal   Collection Time: 02/14/15  4:30 PM  Result Value Ref Range   WBC 7.0 4.0 - 10.5 K/uL   RBC 3.91 (L) 4.22 - 5.81 MIL/uL   Hemoglobin 11.2 (L) 13.0 - 17.0 g/dL   HCT 35.7 (L) 39.0 - 52.0 %   MCV 91.3 78.0 - 100.0 fL   MCH 28.6 26.0 - 34.0 pg   MCHC 31.4 30.0 - 36.0 g/dL   RDW 18.4 (H) 11.5 - 15.5 %   Platelets 169 150 - 400 K/uL  Basic metabolic panel     Status: Abnormal   Collection Time: 02/14/15  4:30 PM  Result Value Ref Range   Sodium 136 135 - 145 mmol/L   Potassium 3.8 3.5 - 5.1 mmol/L   Chloride 98 96 - 112 mmol/L   CO2 31 19 - 32 mmol/L   Glucose, Bld 108 (H) 70 - 99 mg/dL   BUN 68 (H) 6 - 23 mg/dL   Creatinine, Ser 2.23 (H) 0.50 - 1.35 mg/dL   Calcium 9.0 8.4 - 10.5 mg/dL   GFR calc non Af Amer 28 (L) >90 mL/min   GFR calc Af Amer 33 (L) >90  mL/min    Comment: (NOTE) The eGFR has been calculated using the CKD EPI equation. This calculation has not been validated in all clinical situations. eGFR's persistently <90 mL/min signify possible Chronic Kidney Disease.    Anion gap 7 5 - 15  Brain natriuretic peptide     Status: Abnormal   Collection Time: 02/14/15  4:30 PM  Result Value Ref Range   B Natriuretic Peptide 562.0 (H) 0.0 - 100.0 pg/mL  Troponin I     Status: None   Collection Time: 02/14/15  4:30 PM  Result Value Ref Range   Troponin I <0.03 <0.031 ng/mL    Comment:        NO INDICATION OF MYOCARDIAL INJURY.   Blood gas, arterial     Status: Abnormal   Collection Time: 02/14/15  8:40 PM  Result Value Ref Range   O2 Content 2.0 L/min   Delivery systems NASAL CANNULA    pH, Arterial 7.459 (H) 7.350 - 7.450   pCO2 arterial 36.1 35.0 - 45.0 mmHg   pO2, Arterial 68.9 (L) 80.0 - 100.0 mmHg   Bicarbonate 25.2 (H) 20.0 - 24.0 mEq/L   TCO2 23.0 0 - 100 mmol/L  Acid-Base Excess 1.7 0.0 - 2.0 mmol/L   O2 Saturation 94.4 %   Patient temperature 37.0    Collection site RIGHT RADIAL    Drawn by [phone]    Sample type ARTERIAL DRAW    Allens test (pass/fail) PASS PASS  Glucose, capillary     Status: Abnormal   Collection Time: 02/14/15  9:47 PM  Result Value Ref Range   Glucose-Capillary 140 (H) 70 - 99 mg/dL   Comment 1 Notify RN    Comment 2 Document in Chart   MRSA PCR Screening     Status: Abnormal   Collection Time: 02/15/15  2:40 AM  Result Value Ref Range   MRSA by PCR POSITIVE (A) NEGATIVE    Comment:        The GeneXpert MRSA Assay (FDA approved for NASAL specimens only), is one component of a comprehensive MRSA colonization surveillance program. It is not intended to diagnose MRSA infection nor to guide or monitor treatment for MRSA infections. RESULT CALLED TO, READ BACK BY AND VERIFIED WITH: WOODS K AT 0414 ON [id number] BY FORSYTH K   CBC     Status: Abnormal   Collection Time: 02/15/15  6:49  AM  Result Value Ref Range   WBC 6.7 4.0 - 10.5 K/uL   RBC 3.82 (L) 4.22 - 5.81 MIL/uL   Hemoglobin 11.0 (L) 13.0 - 17.0 g/dL   HCT 34.7 (L) 39.0 - 52.0 %   MCV 90.8 78.0 - 100.0 fL   MCH 28.8 26.0 - 34.0 pg   MCHC 31.7 30.0 - 36.0 g/dL   RDW 18.4 (H) 11.5 - 15.5 %   Platelets 172 150 - 400 K/uL  Basic metabolic panel     Status: Abnormal   Collection Time: 02/15/15  6:49 AM  Result Value Ref Range   Sodium 135 135 - 145 mmol/L   Potassium 4.2 3.5 - 5.1 mmol/L   Chloride 99 96 - 112 mmol/L   CO2 26 19 - 32 mmol/L   Glucose, Bld 141 (H) 70 - 99 mg/dL   BUN 65 (H) 6 - 23 mg/dL   Creatinine, Ser 2.39 (H) 0.50 - 1.35 mg/dL   Calcium 9.1 8.4 - 10.5 mg/dL   GFR calc non Af Amer 26 (L) >90 mL/min   GFR calc Af Amer 30 (L) >90 mL/min    Comment: (NOTE) The eGFR has been calculated using the CKD EPI equation. This calculation has not been validated in all clinical situations. eGFR's persistently <90 mL/min signify possible Chronic Kidney Disease.    Anion gap 10 5 - 15  Glucose, capillary     Status: Abnormal   Collection Time: 02/15/15  7:37 AM  Result Value Ref Range   Glucose-Capillary 153 (H) 70 - 99 mg/dL   Comment 1 Notify RN    Comment 2 Document in Chart     ABGS  Recent Labs  02/14/15 2040  PHART 7.459*  PO2ART 68.9*  TCO2 23.0  HCO3 25.2*   CULTURES Recent Results (from the past 240 hour(s))  MRSA PCR Screening     Status: Abnormal   Collection Time: 02/15/15  2:40 AM  Result Value Ref Range Status   MRSA by PCR POSITIVE (A) NEGATIVE Final    Comment:        The GeneXpert MRSA Assay (FDA approved for NASAL specimens only), is one component of a comprehensive MRSA colonization surveillance program. It is not intended to diagnose MRSA infection nor to guide or monitor treatment  for MRSA infections. RESULT CALLED TO, READ BACK BY AND VERIFIED WITH: WOODS K AT 0414 ON [id number] BY Mikel Cella K    Studies/Results: Dg Chest Port 1 View  02/14/2015   CLINICAL  DATA:  Shortness of breath, cough  EXAM: PORTABLE CHEST - 1 VIEW  COMPARISON:  01/13/2015  FINDINGS: Cardiomegaly again noted. No acute infiltrate or pleural effusion. No pulmonary edema. Mild degenerative changes thoracic spine.  IMPRESSION: Cardiomegaly.  No active disease.   Electronically Signed   By: Lahoma Crocker M.D.   On: 02/14/2015 16:26    Medications: I have reviewed the patient's current medications.  Assesment:   Principal Problem:   Shortness of breath Active Problems:   Hypertension   Hypothyroid   Diabetes   SOB (shortness of breath)   Respiratory distress   COPD exacerbation   Acute on chronic systolic CHF (congestive heart failure)   Seizures   Craniopharyngioma   Essential hypertension chronic CKD III  Plan:  Medications reviewed Will do pulmonary consult Will change lasix to po Will monitor BMP Will adjust his steroid.    LOS: 1 day   Anelis Hrivnak 02/15/2015, 8:29 AM

## 2015-02-16 LAB — BASIC METABOLIC PANEL
Anion gap: 10 (ref 5–15)
BUN: 74 mg/dL — AB (ref 6–23)
CALCIUM: 8.8 mg/dL (ref 8.4–10.5)
CO2: 25 mmol/L (ref 19–32)
CREATININE: 2.49 mg/dL — AB (ref 0.50–1.35)
Chloride: 96 mmol/L (ref 96–112)
GFR calc Af Amer: 29 mL/min — ABNORMAL LOW (ref >90)
GFR, EST NON AFRICAN AMERICAN: 25 mL/min — AB (ref >90)
Glucose, Bld: 133 mg/dL — ABNORMAL HIGH (ref 70–99)
Potassium: 4.7 mmol/L (ref 3.5–5.1)
Sodium: 131 mmol/L — ABNORMAL LOW (ref 135–145)

## 2015-02-16 LAB — HEMOGLOBIN A1C
Hgb A1c MFr Bld: 5.7 % — ABNORMAL HIGH (ref 4.8–5.6)
Mean Plasma Glucose: 117 mg/dL

## 2015-02-16 LAB — GLUCOSE, CAPILLARY
Glucose-Capillary: 124 mg/dL — ABNORMAL HIGH (ref 70–99)
Glucose-Capillary: 154 mg/dL — ABNORMAL HIGH (ref 70–99)
Glucose-Capillary: 99 mg/dL (ref 70–99)
Glucose-Capillary: 121 mg/dL — ABNORMAL HIGH (ref 70–99)

## 2015-02-16 MED ORDER — METHYLPREDNISOLONE SODIUM SUCC 40 MG IJ SOLR
40.0000 mg | Freq: Two times a day (BID) | INTRAMUSCULAR | Status: DC
  Administered 2015-02-16 – 2015-02-18 (×4): 40 mg via INTRAVENOUS
  Filled 2015-02-16 (×4): qty 1

## 2015-02-16 NOTE — Progress Notes (Signed)
Subjective: He says he is willing to try C Pap again. He's not sure if he has a C Pap machine at home or not.  Objective: Vital signs in last 24 hours: Temp:  [98.2 F (36.8 C)-99 F (37.2 C)] 99 F (37.2 C) (03/11 0444) Pulse Rate:  [89-94] 94 (03/11 0444) Resp:  [20-21] 21 (03/11 0444) BP: (130-152)/(54-71) 135/54 mmHg (03/11 0444) SpO2:  [94 %-99 %] 99 % (03/11 0444) Weight:  [105.096 kg (231 lb 11.1 oz)] 105.096 kg (231 lb 11.1 oz) (03/11 0444) Weight change: -3.767 kg (-8 lb 4.9 oz) Last BM Date: 02/15/15  Intake/Output from previous day: 03/10 0701 - 03/11 0700 In: 486 [P.O.:480; I.V.:6] Out: 900 [Urine:900]  PHYSICAL EXAM General appearance: alert, cooperative and mild distress Resp: rhonchi bilaterally Cardio: regular rate and rhythm, S1, S2 normal, no murmur, click, rub or gallop GI: soft, non-tender; bowel sounds normal; no masses,  no organomegaly Extremities: Some chronic edema and venous stasis but much improved from previously  Lab Results:  Results for orders placed or performed during the hospital encounter of 02/14/15 (from the past 48 hour(s))  CBC     Status: Abnormal   Collection Time: 02/14/15  4:30 PM  Result Value Ref Range   WBC 7.0 4.0 - 10.5 K/uL   RBC 3.91 (L) 4.22 - 5.81 MIL/uL   Hemoglobin 11.2 (L) 13.0 - 17.0 g/dL   HCT 35.7 (L) 39.0 - 52.0 %   MCV 91.3 78.0 - 100.0 fL   MCH 28.6 26.0 - 34.0 pg   MCHC 31.4 30.0 - 36.0 g/dL   RDW 18.4 (H) 11.5 - 15.5 %   Platelets 169 150 - 400 K/uL  Basic metabolic panel     Status: Abnormal   Collection Time: 02/14/15  4:30 PM  Result Value Ref Range   Sodium 136 135 - 145 mmol/L   Potassium 3.8 3.5 - 5.1 mmol/L   Chloride 98 96 - 112 mmol/L   CO2 31 19 - 32 mmol/L   Glucose, Bld 108 (H) 70 - 99 mg/dL   BUN 68 (H) 6 - 23 mg/dL   Creatinine, Ser 2.23 (H) 0.50 - 1.35 mg/dL   Calcium 9.0 8.4 - 10.5 mg/dL   GFR calc non Af Amer 28 (L) >90 mL/min   GFR calc Af Amer 33 (L) >90 mL/min    Comment:  (NOTE) The eGFR has been calculated using the CKD EPI equation. This calculation has not been validated in all clinical situations. eGFR's persistently <90 mL/min signify possible Chronic Kidney Disease.    Anion gap 7 5 - 15  Brain natriuretic peptide     Status: Abnormal   Collection Time: 02/14/15  4:30 PM  Result Value Ref Range   B Natriuretic Peptide 562.0 (H) 0.0 - 100.0 pg/mL  Troponin I     Status: None   Collection Time: 02/14/15  4:30 PM  Result Value Ref Range   Troponin I <0.03 <0.031 ng/mL    Comment:        NO INDICATION OF MYOCARDIAL INJURY.   Blood gas, arterial     Status: Abnormal   Collection Time: 02/14/15  8:40 PM  Result Value Ref Range   O2 Content 2.0 L/min   Delivery systems NASAL CANNULA    pH, Arterial 7.459 (H) 7.350 - 7.450   pCO2 arterial 36.1 35.0 - 45.0 mmHg   pO2, Arterial 68.9 (L) 80.0 - 100.0 mmHg   Bicarbonate 25.2 (H) 20.0 - 24.0 mEq/L  TCO2 23.0 0 - 100 mmol/L   Acid-Base Excess 1.7 0.0 - 2.0 mmol/L   O2 Saturation 94.4 %   Patient temperature 37.0    Collection site RIGHT RADIAL    Drawn by [phone]    Sample type ARTERIAL DRAW    Allens test (pass/fail) PASS PASS  Glucose, capillary     Status: Abnormal   Collection Time: 02/14/15  9:47 PM  Result Value Ref Range   Glucose-Capillary 140 (H) 70 - 99 mg/dL   Comment 1 Notify RN    Comment 2 Document in Chart   MRSA PCR Screening     Status: Abnormal   Collection Time: 02/15/15  2:40 AM  Result Value Ref Range   MRSA by PCR POSITIVE (A) NEGATIVE    Comment:        The GeneXpert MRSA Assay (FDA approved for NASAL specimens only), is one component of a comprehensive MRSA colonization surveillance program. It is not intended to diagnose MRSA infection nor to guide or monitor treatment for MRSA infections. RESULT CALLED TO, READ BACK BY AND VERIFIED WITH: WOODS K AT 0414 ON [id number] BY FORSYTH K   CBC     Status: Abnormal   Collection Time: 02/15/15  6:49 AM  Result Value Ref  Range   WBC 6.7 4.0 - 10.5 K/uL   RBC 3.82 (L) 4.22 - 5.81 MIL/uL   Hemoglobin 11.0 (L) 13.0 - 17.0 g/dL   HCT 34.7 (L) 39.0 - 52.0 %   MCV 90.8 78.0 - 100.0 fL   MCH 28.8 26.0 - 34.0 pg   MCHC 31.7 30.0 - 36.0 g/dL   RDW 18.4 (H) 11.5 - 15.5 %   Platelets 172 150 - 400 K/uL  Basic metabolic panel     Status: Abnormal   Collection Time: 02/15/15  6:49 AM  Result Value Ref Range   Sodium 135 135 - 145 mmol/L   Potassium 4.2 3.5 - 5.1 mmol/L   Chloride 99 96 - 112 mmol/L   CO2 26 19 - 32 mmol/L   Glucose, Bld 141 (H) 70 - 99 mg/dL   BUN 65 (H) 6 - 23 mg/dL   Creatinine, Ser 2.39 (H) 0.50 - 1.35 mg/dL   Calcium 9.1 8.4 - 10.5 mg/dL   GFR calc non Af Amer 26 (L) >90 mL/min   GFR calc Af Amer 30 (L) >90 mL/min    Comment: (NOTE) The eGFR has been calculated using the CKD EPI equation. This calculation has not been validated in all clinical situations. eGFR's persistently <90 mL/min signify possible Chronic Kidney Disease.    Anion gap 10 5 - 15  Hemoglobin A1c     Status: Abnormal   Collection Time: 02/15/15  6:49 AM  Result Value Ref Range   Hgb A1c MFr Bld 5.7 (H) 4.8 - 5.6 %    Comment: (NOTE)         Pre-diabetes: 5.7 - 6.4         Diabetes: >6.4         Glycemic control for adults with diabetes: <7.0    Mean Plasma Glucose 117 mg/dL    Comment: (NOTE) Performed At: [facility] [address] [place], [place] [id number] Lindon Romp MD YK:[id number]   Glucose, capillary     Status: Abnormal   Collection Time: 02/15/15  7:37 AM  Result Value Ref Range   Glucose-Capillary 153 (H) 70 - 99 mg/dL   Comment 1 Notify RN    Comment 2  Document in Chart   Glucose, capillary     Status: Abnormal   Collection Time: 02/15/15 11:24 AM  Result Value Ref Range   Glucose-Capillary 180 (H) 70 - 99 mg/dL   Comment 1 Notify RN    Comment 2 Document in Chart   Glucose, capillary     Status: Abnormal   Collection Time: 02/15/15  5:31 PM  Result Value Ref Range    Glucose-Capillary 124 (H) 70 - 99 mg/dL  Glucose, capillary     Status: Abnormal   Collection Time: 02/15/15  9:47 PM  Result Value Ref Range   Glucose-Capillary 168 (H) 70 - 99 mg/dL  Basic metabolic panel     Status: Abnormal   Collection Time: 02/16/15  6:54 AM  Result Value Ref Range   Sodium 131 (L) 135 - 145 mmol/L   Potassium 4.7 3.5 - 5.1 mmol/L   Chloride 96 96 - 112 mmol/L   CO2 25 19 - 32 mmol/L   Glucose, Bld 133 (H) 70 - 99 mg/dL   BUN 74 (H) 6 - 23 mg/dL   Creatinine, Ser 2.49 (H) 0.50 - 1.35 mg/dL   Calcium 8.8 8.4 - 10.5 mg/dL   GFR calc non Af Amer 25 (L) >90 mL/min   GFR calc Af Amer 29 (L) >90 mL/min    Comment: (NOTE) The eGFR has been calculated using the CKD EPI equation. This calculation has not been validated in all clinical situations. eGFR's persistently <90 mL/min signify possible Chronic Kidney Disease.    Anion gap 10 5 - 15  Glucose, capillary     Status: Abnormal   Collection Time: 02/16/15  7:33 AM  Result Value Ref Range   Glucose-Capillary 124 (H) 70 - 99 mg/dL    ABGS  Recent Labs  02/14/15 2040  PHART 7.459*  PO2ART 68.9*  TCO2 23.0  HCO3 25.2*   CULTURES Recent Results (from the past 240 hour(s))  MRSA PCR Screening     Status: Abnormal   Collection Time: 02/15/15  2:40 AM  Result Value Ref Range Status   MRSA by PCR POSITIVE (A) NEGATIVE Final    Comment:        The GeneXpert MRSA Assay (FDA approved for NASAL specimens only), is one component of a comprehensive MRSA colonization surveillance program. It is not intended to diagnose MRSA infection nor to guide or monitor treatment for MRSA infections. RESULT CALLED TO, READ BACK BY AND VERIFIED WITH: WOODS K AT 0414 ON [id number] BY Mikel Cella K    Studies/Results: Dg Chest Port 1 View  02/14/2015   CLINICAL DATA:  Shortness of breath, cough  EXAM: PORTABLE CHEST - 1 VIEW  COMPARISON:  01/13/2015  FINDINGS: Cardiomegaly again noted. No acute infiltrate or pleural effusion. No  pulmonary edema. Mild degenerative changes thoracic spine.  IMPRESSION: Cardiomegaly.  No active disease.   Electronically Signed   By: Lahoma Crocker M.D.   On: 02/14/2015 16:26    Medications:  Prior to Admission:  Prescriptions prior to admission  Medication Sig Dispense Refill Last Dose  . albuterol (PROVENTIL) (2.5 MG/3ML) 0.083% nebulizer solution Take 3 mLs (2.5 mg total) by nebulization every 4 (four) hours as needed for wheezing or shortness of breath. 75 mL 12 02/13/2015 at Unknown time  . aspirin EC 325 MG EC tablet Take 1 tablet (325 mg total) by mouth daily. 30 tablet 0 02/13/2015 at Unknown time  . budesonide-formoterol (SYMBICORT) 160-4.5 MCG/ACT inhaler Inhale 2 puffs into the lungs 2 (two)  times daily.   02/13/2015 at Unknown time  . cloNIDine (CATAPRES) 0.2 MG tablet Take 1 tablet (0.2 mg total) by mouth 3 (three) times daily. 90 tablet 3 02/13/2015 at Unknown time  . desmopressin (DDAVP) 0.2 MG tablet Take 1 tablet (0.2 mg total) by mouth 2 (two) times daily. 60 tablet 3 02/13/2015 at Unknown time  . dexamethasone (DECADRON) 0.5 MG tablet Take 0.5 mg by mouth daily.   02/13/2015 at Unknown time  . docusate sodium (COLACE) 100 MG capsule Take 100 mg by mouth 2 (two) times daily.   02/13/2015 at Unknown time  . ipratropium (ATROVENT) 0.02 % nebulizer solution Take 0.5 mg by nebulization 4 (four) times daily.   02/14/2015 at Unknown time  . levothyroxine (SYNTHROID, LEVOTHROID) 175 MCG tablet Take 175 mcg by mouth daily before breakfast.   02/14/2015 at Unknown time  . metolazone (ZAROXOLYN) 10 MG tablet Take 1 tablet (10 mg total) by mouth daily. (Patient taking differently: Take 5 mg by mouth 2 (two) times daily. )   02/13/2015 at Unknown time  . mometasone (NASONEX) 50 MCG/ACT nasal spray Place 2 sprays into the nose daily. 17 g 12 02/13/2015 at Unknown time  . omeprazole (PRILOSEC) 20 MG capsule Take 20 mg by mouth daily.   02/13/2015 at Unknown time  . phenytoin (DILANTIN) 100 MG ER capsule Take 200-300  mg by mouth daily. Takes 200 mg on Monday and Thursdsay. On all other days of the week takes 300 mg.   02/13/2015 at Unknown time  . polyethylene glycol powder (GLYCOLAX/MIRALAX) powder Take 17 g by mouth daily. May decrease to 17 g three times a week if needed. (Patient taking differently: Take 17 g by mouth daily. May decrease to 17 g three times a week if needed.) 255 g 3 Past Week at Unknown time  . simvastatin (ZOCOR) 80 MG tablet Take 80 mg by mouth daily.    02/13/2015 at Unknown time  . sodium bicarbonate 650 MG tablet Take 1 tablet (650 mg total) by mouth 2 (two) times daily.   02/13/2015 at Unknown time  . camphor-menthol (SARNA) lotion Apply topically as needed for itching. (Patient taking differently: Apply 1 application topically as needed for itching. ) 222 mL 0 unknown at Unknown time  . furosemide (LASIX) 40 MG tablet Take 3 tablets (120 mg total) by mouth 3 (three) times daily with meals. (Patient not taking: Reported on 02/14/2015) 30 tablet  Taking  . HYDROcodone-acetaminophen (NORCO/VICODIN) 5-325 MG per tablet Take two tablets by mouth every 6 hours as needed for pain. Max APAP 3gm/24hr from all sources (Patient taking differently: Take 2 tablets by mouth every 6 (six) hours as needed. Take two tablets by mouth every 6 hours as needed for pain. Max APAP 3gm/24hr from all sources) 240 tablet 0 unknown  . hydrocortisone (PROCTOSOL HC) 2.5 % rectal cream Place 1 application rectally 2 (two) times daily. Do not use for longer than 7 days at a time. 30 g 0 unknown  . insulin aspart (NOVOLOG) 100 UNIT/ML injection Inject 0-9 Units into the skin 3 (three) times daily with meals. (Patient not taking: Reported on 01/24/2015) 10 mL 11 Not Taking  . loratadine (CLARITIN) 10 MG tablet Take 10 mg by mouth daily as needed for allergies.    unknown  . ondansetron (ZOFRAN) 4 MG tablet Take 1 tablet (4 mg total) by mouth every 8 (eight) hours as needed for nausea or vomiting. 20 tablet 0 unknown  . peg 3350  powder (  MOVIPREP) 100 G SOLR Take 1 kit (200 g total) by mouth as directed. 1 kit 0 unknown   Scheduled: . antiseptic oral rinse  7 mL Mouth Rinse BID  . atorvastatin  40 mg Oral q1800  . Chlorhexidine Gluconate Cloth  6 each Topical Q0600  . cloNIDine  0.2 mg Oral TID  . desmopressin  0.2 mg Oral BID  . heparin  5,000 Units Subcutaneous 3 times per day  . insulin aspart  0-9 Units Subcutaneous TID WC  . ipratropium  0.5 mg Nebulization QID  . levalbuterol  1.25 mg Nebulization QID  . levothyroxine  175 mcg Oral QAC breakfast  . methylPREDNISolone (SOLU-MEDROL) injection  40 mg Intravenous Q12H  . mupirocin ointment  1 application Nasal BID  . pantoprazole  40 mg Oral Daily  . phenytoin  200 mg Oral Once per day on Mon Thu   And  . phenytoin  300 mg Oral Once per day on Sun Tue Wed Fri Sat  . senna  1 tablet Oral BID  . sodium chloride  3 mL Intravenous Q12H  . sodium chloride  3 mL Intravenous Q12H   Continuous:  FXO:VANVBT chloride, hydrALAZINE, HYDROcodone-acetaminophen, loratadine, ondansetron **OR** ondansetron (ZOFRAN) IV, polyethylene glycol, sodium chloride  Assesment: He was admitted with shortness of breath. He has this from a combination of multiple factors including COPD exacerbation and acute on chronic systolic heart failure. He also has sleep apnea and has not been using his CPAP. I explained to him again the importance of using C Pap that it should help keep him from developing more problems with heart failure and may also keep him from developing cardiac arrhythmias and potentially death during sleep. Principal Problem:   Shortness of breath Active Problems:   Hypertension   Hypothyroid   Diabetes   SOB (shortness of breath)   Respiratory distress   COPD exacerbation   Acute on chronic systolic CHF (congestive heart failure)   Seizures   Craniopharyngioma   Essential hypertension    Plan: I will discuss with his home health company and see if he still has a  C Pap machine and if his sleep study is still currently been off to start him back.    LOS: 2 days   Annelyse Rey L 02/16/2015, 8:24 AM

## 2015-02-16 NOTE — Progress Notes (Signed)
Patient took CPAP off asked if he wanted to put back on he stated ;"he believed he would just wear oxygen but stated he thought it would work". Placed back on 2lpm/[place] . Doubtful how much he will use even if he has machine.

## 2015-02-16 NOTE — Progress Notes (Signed)
Subjective: Patient feels better. However, he has some wheezing and tightness of the chest. No fever or chills. His renal function is getting worse. [name] on combination of diuretics.  Objective: Vital signs in last 24 hours: Temp:  [98.2 F (36.8 C)-99 F (37.2 C)] 99 F (37.2 C) (03/11 0444) Pulse Rate:  [89-94] 94 (03/11 0444) Resp:  [20-21] 21 (03/11 0444) BP: (130-152)/(54-71) 135/54 mmHg (03/11 0444) SpO2:  [94 %-99 %] 99 % (03/11 0444) Weight:  [105.096 kg (231 lb 11.1 oz)] 105.096 kg (231 lb 11.1 oz) (03/11 0444) Weight change: -3.767 kg (-8 lb 4.9 oz) Last BM Date: 02/15/15  Intake/Output from previous day: 03/10 0701 - 03/11 0700 In: 486 [P.O.:480; I.V.:6] Out: 900 [Urine:900]  PHYSICAL EXAM General appearance: alert, no distress and moderately obese Resp: diminished breath sounds bilaterally and rhonchi bilaterally Cardio: S1, S2 normal GI: soft, non-tender; bowel sounds normal; no masses,  no organomegaly Extremities: extremities normal, atraumatic, no cyanosis or edema  Lab Results:  Results for orders placed or performed during the hospital encounter of 02/14/15 (from the past 48 hour(s))  CBC     Status: Abnormal   Collection Time: 02/14/15  4:30 PM  Result Value Ref Range   WBC 7.0 4.0 - 10.5 K/uL   RBC 3.91 (L) 4.22 - 5.81 MIL/uL   Hemoglobin 11.2 (L) 13.0 - 17.0 g/dL   HCT 35.7 (L) 39.0 - 52.0 %   MCV 91.3 78.0 - 100.0 fL   MCH 28.6 26.0 - 34.0 pg   MCHC 31.4 30.0 - 36.0 g/dL   RDW 18.4 (H) 11.5 - 15.5 %   Platelets 169 150 - 400 K/uL  Basic metabolic panel     Status: Abnormal   Collection Time: 02/14/15  4:30 PM  Result Value Ref Range   Sodium 136 135 - 145 mmol/L   Potassium 3.8 3.5 - 5.1 mmol/L   Chloride 98 96 - 112 mmol/L   CO2 31 19 - 32 mmol/L   Glucose, Bld 108 (H) 70 - 99 mg/dL   BUN 68 (H) 6 - 23 mg/dL   Creatinine, Ser 2.23 (H) 0.50 - 1.35 mg/dL   Calcium 9.0 8.4 - 10.5 mg/dL   GFR calc non Af Amer 28 (L) >90 mL/min   GFR calc Af  Amer 33 (L) >90 mL/min    Comment: (NOTE) The eGFR has been calculated using the CKD EPI equation. This calculation has not been validated in all clinical situations. eGFR's persistently <90 mL/min signify possible Chronic Kidney Disease.    Anion gap 7 5 - 15  Brain natriuretic peptide     Status: Abnormal   Collection Time: 02/14/15  4:30 PM  Result Value Ref Range   B Natriuretic Peptide 562.0 (H) 0.0 - 100.0 pg/mL  Troponin I     Status: None   Collection Time: 02/14/15  4:30 PM  Result Value Ref Range   Troponin I <0.03 <0.031 ng/mL    Comment:        NO INDICATION OF MYOCARDIAL INJURY.   Blood gas, arterial     Status: Abnormal   Collection Time: 02/14/15  8:40 PM  Result Value Ref Range   O2 Content 2.0 L/min   Delivery systems NASAL CANNULA    pH, Arterial 7.459 (H) 7.350 - 7.450   pCO2 arterial 36.1 35.0 - 45.0 mmHg   pO2, Arterial 68.9 (L) 80.0 - 100.0 mmHg   Bicarbonate 25.2 (H) 20.0 - 24.0 mEq/L   TCO2 23.0  0 - 100 mmol/L   Acid-Base Excess 1.7 0.0 - 2.0 mmol/L   O2 Saturation 94.4 %   Patient temperature 37.0    Collection site RIGHT RADIAL    Drawn by [phone]    Sample type ARTERIAL DRAW    Allens test (pass/fail) PASS PASS  Glucose, capillary     Status: Abnormal   Collection Time: 02/14/15  9:47 PM  Result Value Ref Range   Glucose-Capillary 140 (H) 70 - 99 mg/dL   Comment 1 Notify RN    Comment 2 Document in Chart   MRSA PCR Screening     Status: Abnormal   Collection Time: 02/15/15  2:40 AM  Result Value Ref Range   MRSA by PCR POSITIVE (A) NEGATIVE    Comment:        The GeneXpert MRSA Assay (FDA approved for NASAL specimens only), is one component of a comprehensive MRSA colonization surveillance program. It is not intended to diagnose MRSA infection nor to guide or monitor treatment for MRSA infections. RESULT CALLED TO, READ BACK BY AND VERIFIED WITH: WOODS K AT 0414 ON [id number] BY FORSYTH K   CBC     Status: Abnormal   Collection Time:  02/15/15  6:49 AM  Result Value Ref Range   WBC 6.7 4.0 - 10.5 K/uL   RBC 3.82 (L) 4.22 - 5.81 MIL/uL   Hemoglobin 11.0 (L) 13.0 - 17.0 g/dL   HCT 34.7 (L) 39.0 - 52.0 %   MCV 90.8 78.0 - 100.0 fL   MCH 28.8 26.0 - 34.0 pg   MCHC 31.7 30.0 - 36.0 g/dL   RDW 18.4 (H) 11.5 - 15.5 %   Platelets 172 150 - 400 K/uL  Basic metabolic panel     Status: Abnormal   Collection Time: 02/15/15  6:49 AM  Result Value Ref Range   Sodium 135 135 - 145 mmol/L   Potassium 4.2 3.5 - 5.1 mmol/L   Chloride 99 96 - 112 mmol/L   CO2 26 19 - 32 mmol/L   Glucose, Bld 141 (H) 70 - 99 mg/dL   BUN 65 (H) 6 - 23 mg/dL   Creatinine, Ser 2.39 (H) 0.50 - 1.35 mg/dL   Calcium 9.1 8.4 - 10.5 mg/dL   GFR calc non Af Amer 26 (L) >90 mL/min   GFR calc Af Amer 30 (L) >90 mL/min    Comment: (NOTE) The eGFR has been calculated using the CKD EPI equation. This calculation has not been validated in all clinical situations. eGFR's persistently <90 mL/min signify possible Chronic Kidney Disease.    Anion gap 10 5 - 15  Hemoglobin A1c     Status: Abnormal   Collection Time: 02/15/15  6:49 AM  Result Value Ref Range   Hgb A1c MFr Bld 5.7 (H) 4.8 - 5.6 %    Comment: (NOTE)         Pre-diabetes: 5.7 - 6.4         Diabetes: >6.4         Glycemic control for adults with diabetes: <7.0    Mean Plasma Glucose 117 mg/dL    Comment: (NOTE) Performed At: [facility] [address] [place], [place] [id number] Lindon Romp MD VV:[id number]   Glucose, capillary     Status: Abnormal   Collection Time: 02/15/15  7:37 AM  Result Value Ref Range   Glucose-Capillary 153 (H) 70 - 99 mg/dL   Comment 1 Notify RN    Comment 2 Document in  Chart   Glucose, capillary     Status: Abnormal   Collection Time: 02/15/15 11:24 AM  Result Value Ref Range   Glucose-Capillary 180 (H) 70 - 99 mg/dL   Comment 1 Notify RN    Comment 2 Document in Chart   Glucose, capillary     Status: Abnormal   Collection Time: 02/15/15   5:31 PM  Result Value Ref Range   Glucose-Capillary 124 (H) 70 - 99 mg/dL  Glucose, capillary     Status: Abnormal   Collection Time: 02/15/15  9:47 PM  Result Value Ref Range   Glucose-Capillary 168 (H) 70 - 99 mg/dL  Basic metabolic panel     Status: Abnormal   Collection Time: 02/16/15  6:54 AM  Result Value Ref Range   Sodium 131 (L) 135 - 145 mmol/L   Potassium 4.7 3.5 - 5.1 mmol/L   Chloride 96 96 - 112 mmol/L   CO2 25 19 - 32 mmol/L   Glucose, Bld 133 (H) 70 - 99 mg/dL   BUN 74 (H) 6 - 23 mg/dL   Creatinine, Ser 2.49 (H) 0.50 - 1.35 mg/dL   Calcium 8.8 8.4 - 10.5 mg/dL   GFR calc non Af Amer 25 (L) >90 mL/min   GFR calc Af Amer 29 (L) >90 mL/min    Comment: (NOTE) The eGFR has been calculated using the CKD EPI equation. This calculation has not been validated in all clinical situations. eGFR's persistently <90 mL/min signify possible Chronic Kidney Disease.    Anion gap 10 5 - 15  Glucose, capillary     Status: Abnormal   Collection Time: 02/16/15  7:33 AM  Result Value Ref Range   Glucose-Capillary 124 (H) 70 - 99 mg/dL    ABGS  Recent Labs  02/14/15 2040  PHART 7.459*  PO2ART 68.9*  TCO2 23.0  HCO3 25.2*   CULTURES Recent Results (from the past 240 hour(s))  MRSA PCR Screening     Status: Abnormal   Collection Time: 02/15/15  2:40 AM  Result Value Ref Range Status   MRSA by PCR POSITIVE (A) NEGATIVE Final    Comment:        The GeneXpert MRSA Assay (FDA approved for NASAL specimens only), is one component of a comprehensive MRSA colonization surveillance program. It is not intended to diagnose MRSA infection nor to guide or monitor treatment for MRSA infections. RESULT CALLED TO, READ BACK BY AND VERIFIED WITH: WOODS K AT 0414 ON [id number] BY Mikel Cella K    Studies/Results: Dg Chest Port 1 View  02/14/2015   CLINICAL DATA:  Shortness of breath, cough  EXAM: PORTABLE CHEST - 1 VIEW  COMPARISON:  01/13/2015  FINDINGS: Cardiomegaly again noted. No  acute infiltrate or pleural effusion. No pulmonary edema. Mild degenerative changes thoracic spine.  IMPRESSION: Cardiomegaly.  No active disease.   Electronically Signed   By: Lahoma Crocker M.D.   On: 02/14/2015 16:26    Medications: I have reviewed the patient's current medications.  Assesment:   Principal Problem:   Shortness of breath Active Problems:   Hypertension   Hypothyroid   Diabetes   SOB (shortness of breath)   Respiratory distress   COPD exacerbation   Acute on chronic systolic CHF (congestive heart failure)   Seizures   Craniopharyngioma   Essential hypertension chronic CKD III  Plan:  Medications reviewed Will do pulmonary consultappreciated Will monitor BMP Will hold diuretics    LOS: 2 days   [name] [name] 02/16/2015, 7:55 AM

## 2015-02-16 NOTE — Progress Notes (Addendum)
Assesment: He was admitted with shortness of breath. He has this from a combination of multiple factors including COPD exacerbation and acute on chronic systolic heart failure. He also has sleep apnea and has not been using his CPAP. I explained to him again the importance of using C Pap that it should help keep him from developing more problems with heart failure and may also keep him from developing cardiac arrhythmias and potentially death during sleep          Note above was written by Dr. Luan Pulling.   Patient states he wish some one had told him about use of CPAP. Explained to him again about CPAP and what MD wanted. He still acts if this is the first time anyone talked about CPAP. Never the less He tried a medium full face mask on auto titrate 5 to 12 pressure with 2lpm/ oxygen bled in. Will continue to work with him. This was placed on after his first round breathing treatment.

## 2015-02-17 LAB — GLUCOSE, CAPILLARY
GLUCOSE-CAPILLARY: 166 mg/dL — AB (ref 70–99)
GLUCOSE-CAPILLARY: 74 mg/dL (ref 70–99)
GLUCOSE-CAPILLARY: 146 mg/dL — AB (ref 70–99)
Glucose-Capillary: 103 mg/dL — ABNORMAL HIGH (ref 70–99)

## 2015-02-17 LAB — BASIC METABOLIC PANEL
Anion gap: 8 (ref 5–15)
Anion gap: 9 (ref 5–15)
BUN: 80 mg/dL — ABNORMAL HIGH (ref 6–23)
BUN: 83 mg/dL — ABNORMAL HIGH (ref 6–23)
CALCIUM: 8.4 mg/dL (ref 8.4–10.5)
CHLORIDE: 96 mmol/L (ref 96–112)
CO2: 27 mmol/L (ref 19–32)
CO2: 27 mmol/L (ref 19–32)
CREATININE: 2.44 mg/dL — AB (ref 0.50–1.35)
Calcium: 8.5 mg/dL (ref 8.4–10.5)
Chloride: 95 mmol/L — ABNORMAL LOW (ref 96–112)
Creatinine, Ser: 2.38 mg/dL — ABNORMAL HIGH (ref 0.50–1.35)
GFR calc Af Amer: 29 mL/min — ABNORMAL LOW (ref >90)
GFR calc Af Amer: 30 mL/min — ABNORMAL LOW (ref >90)
GFR, EST NON AFRICAN AMERICAN: 25 mL/min — AB (ref >90)
GFR, EST NON AFRICAN AMERICAN: 26 mL/min — AB (ref >90)
Glucose, Bld: 117 mg/dL — ABNORMAL HIGH (ref 70–99)
Glucose, Bld: 103 mg/dL — ABNORMAL HIGH (ref 70–99)
Potassium: 4.2 mmol/L (ref 3.5–5.1)
Potassium: 4.1 mmol/L (ref 3.5–5.1)
SODIUM: 131 mmol/L — AB (ref 135–145)
SODIUM: 131 mmol/L — AB (ref 135–145)

## 2015-02-17 MED ORDER — SODIUM CHLORIDE 0.9 % IV SOLN
INTRAVENOUS | Status: DC
  Administered 2015-02-17 – 2015-02-18 (×2): via INTRAVENOUS

## 2015-02-17 MED ORDER — GUAIFENESIN 100 MG/5ML PO SOLN
100.0000 mg | Freq: Two times a day (BID) | ORAL | Status: DC | PRN
  Administered 2015-02-17 (×2): 100 mg via ORAL
  Filled 2015-02-17 (×3): qty 5

## 2015-02-17 NOTE — Progress Notes (Signed)
Subjective: Patient is resting. His breathing is better. [name] not able to tolerate BIPAP during the night. His BUN continue to get worse. [name] off diuretics  Objective: Vital signs in last 24 hours: Temp:  [98.2 F (36.8 C)-98.8 F (37.1 C)] 98.8 F (37.1 C) (03/12 0620) Pulse Rate:  [80-88] 87 (03/12 0620) Resp:  [16-20] 20 (03/12 0620) BP: (117-125)/(43-58) 125/58 mmHg (03/12 0620) SpO2:  [94 %-99 %] 98 % (03/12 0753) FiO2 (%):  [21 %] 21 % (03/11 2359) Weight:  [106.142 kg (234 lb)] 106.142 kg (234 lb) (03/12 0713) Weight change:  Last BM Date: 02/15/15  Intake/Output from previous day: 03/11 0701 - 03/12 0700 In: 720 [P.O.:720] Out: 1725 [Urine:1725]  PHYSICAL EXAM General appearance: alert, no distress and moderately obese Resp: diminished breath sounds bilaterally and rhonchi bilaterally Cardio: S1, S2 normal GI: soft, non-tender; bowel sounds normal; no masses,  no organomegaly Extremities: extremities normal, atraumatic, no cyanosis or edema  Lab Results:  Results for orders placed or performed during the hospital encounter of 02/14/15 (from the past 48 hour(s))  Glucose, capillary     Status: Abnormal   Collection Time: 02/15/15 11:24 AM  Result Value Ref Range   Glucose-Capillary 180 (H) 70 - 99 mg/dL   Comment 1 Notify RN    Comment 2 Document in Chart   Glucose, capillary     Status: Abnormal   Collection Time: 02/15/15  5:31 PM  Result Value Ref Range   Glucose-Capillary 124 (H) 70 - 99 mg/dL  Glucose, capillary     Status: Abnormal   Collection Time: 02/15/15  9:47 PM  Result Value Ref Range   Glucose-Capillary 168 (H) 70 - 99 mg/dL  Basic metabolic panel     Status: Abnormal   Collection Time: 02/16/15  6:54 AM  Result Value Ref Range   Sodium 131 (L) 135 - 145 mmol/L   Potassium 4.7 3.5 - 5.1 mmol/L   Chloride 96 96 - 112 mmol/L   CO2 25 19 - 32 mmol/L   Glucose, Bld 133 (H) 70 - 99 mg/dL   BUN 74 (H) 6 - 23 mg/dL   Creatinine, Ser 2.49 (H)  0.50 - 1.35 mg/dL   Calcium 8.8 8.4 - 10.5 mg/dL   GFR calc non Af Amer 25 (L) >90 mL/min   GFR calc Af Amer 29 (L) >90 mL/min    Comment: (NOTE) The eGFR has been calculated using the CKD EPI equation. This calculation has not been validated in all clinical situations. eGFR's persistently <90 mL/min signify possible Chronic Kidney Disease.    Anion gap 10 5 - 15  Glucose, capillary     Status: Abnormal   Collection Time: 02/16/15  7:33 AM  Result Value Ref Range   Glucose-Capillary 124 (H) 70 - 99 mg/dL  Glucose, capillary     Status: Abnormal   Collection Time: 02/16/15 11:46 AM  Result Value Ref Range   Glucose-Capillary 154 (H) 70 - 99 mg/dL  Glucose, capillary     Status: None   Collection Time: 02/16/15  4:53 PM  Result Value Ref Range   Glucose-Capillary 99 70 - 99 mg/dL  Glucose, capillary     Status: Abnormal   Collection Time: 02/16/15  9:17 PM  Result Value Ref Range   Glucose-Capillary 121 (H) 70 - 99 mg/dL  Basic metabolic panel     Status: Abnormal   Collection Time: 02/17/15 12:04 AM  Result Value Ref Range   Sodium 131 (L)  135 - 145 mmol/L   Potassium 4.2 3.5 - 5.1 mmol/L   Chloride 96 96 - 112 mmol/L   CO2 27 19 - 32 mmol/L   Glucose, Bld 117 (H) 70 - 99 mg/dL   BUN 80 (H) 6 - 23 mg/dL   Creatinine, Ser 2.44 (H) 0.50 - 1.35 mg/dL   Calcium 8.5 8.4 - 10.5 mg/dL   GFR calc non Af Amer 25 (L) >90 mL/min   GFR calc Af Amer 29 (L) >90 mL/min    Comment: (NOTE) The eGFR has been calculated using the CKD EPI equation. This calculation has not been validated in all clinical situations. eGFR's persistently <90 mL/min signify possible Chronic Kidney Disease.    Anion gap 8 5 - 15  Basic metabolic panel     Status: Abnormal   Collection Time: 02/17/15  7:27 AM  Result Value Ref Range   Sodium 131 (L) 135 - 145 mmol/L   Potassium 4.1 3.5 - 5.1 mmol/L   Chloride 95 (L) 96 - 112 mmol/L   CO2 27 19 - 32 mmol/L   Glucose, Bld 103 (H) 70 - 99 mg/dL   BUN 83 (H)  6 - 23 mg/dL   Creatinine, Ser 2.38 (H) 0.50 - 1.35 mg/dL   Calcium 8.4 8.4 - 10.5 mg/dL   GFR calc non Af Amer 26 (L) >90 mL/min   GFR calc Af Amer 30 (L) >90 mL/min    Comment: (NOTE) The eGFR has been calculated using the CKD EPI equation. This calculation has not been validated in all clinical situations. eGFR's persistently <90 mL/min signify possible Chronic Kidney Disease.    Anion gap 9 5 - 15  Glucose, capillary     Status: Abnormal   Collection Time: 02/17/15  7:49 AM  Result Value Ref Range   Glucose-Capillary 103 (H) 70 - 99 mg/dL    ABGS  Recent Labs  02/14/15 2040  PHART 7.459*  PO2ART 68.9*  TCO2 23.0  HCO3 25.2*   CULTURES Recent Results (from the past 240 hour(s))  MRSA PCR Screening     Status: Abnormal   Collection Time: 02/15/15  2:40 AM  Result Value Ref Range Status   MRSA by PCR POSITIVE (A) NEGATIVE Final    Comment:        The GeneXpert MRSA Assay (FDA approved for NASAL specimens only), is one component of a comprehensive MRSA colonization surveillance program. It is not intended to diagnose MRSA infection nor to guide or monitor treatment for MRSA infections. RESULT CALLED TO, READ BACK BY AND VERIFIED WITH: WOODS K AT 0414 ON [id number] BY FORSYTH K    Studies/Results: No results found.  Medications: I have reviewed the patient's current medications.  Assesment:   Principal Problem:   Shortness of breath Active Problems:   Hypertension   Hypothyroid   Diabetes   SOB (shortness of breath)   Respiratory distress   COPD exacerbation   Acute on chronic systolic CHF (congestive heart failure)   Seizures   Craniopharyngioma   Essential hypertension chronic CKD III  Plan:  Medications reviewed Will do pulmonary consultappreciated Will monitor BMP Will gently rehydrate    LOS: 3 days   [name] [name] 02/17/2015, 10:21 AM

## 2015-02-18 LAB — GLUCOSE, CAPILLARY
GLUCOSE-CAPILLARY: 123 mg/dL — AB (ref 70–99)
Glucose-Capillary: 104 mg/dL — ABNORMAL HIGH (ref 70–99)
Glucose-Capillary: 114 mg/dL — ABNORMAL HIGH (ref 70–99)
Glucose-Capillary: 125 mg/dL — ABNORMAL HIGH (ref 70–99)

## 2015-02-18 LAB — BASIC METABOLIC PANEL
ANION GAP: 7 (ref 5–15)
ANION GAP: 8 (ref 5–15)
BUN: 80 mg/dL — ABNORMAL HIGH (ref 6–23)
BUN: 83 mg/dL — AB (ref 6–23)
CHLORIDE: 96 mmol/L (ref 96–112)
CO2: 24 mmol/L (ref 19–32)
CO2: 24 mmol/L (ref 19–32)
CREATININE: 2.19 mg/dL — AB (ref 0.50–1.35)
Calcium: 8.1 mg/dL — ABNORMAL LOW (ref 8.4–10.5)
Calcium: 8.1 mg/dL — ABNORMAL LOW (ref 8.4–10.5)
Chloride: 97 mmol/L (ref 96–112)
Creatinine, Ser: 2.23 mg/dL — ABNORMAL HIGH (ref 0.50–1.35)
GFR calc Af Amer: 33 mL/min — ABNORMAL LOW (ref >90)
GFR calc Af Amer: 33 mL/min — ABNORMAL LOW (ref >90)
GFR calc non Af Amer: 29 mL/min — ABNORMAL LOW (ref >90)
GFR calc non Af Amer: 28 mL/min — ABNORMAL LOW (ref >90)
GLUCOSE: 89 mg/dL (ref 70–99)
Glucose, Bld: 95 mg/dL (ref 70–99)
POTASSIUM: 4 mmol/L (ref 3.5–5.1)
Potassium: 4.1 mmol/L (ref 3.5–5.1)
SODIUM: 129 mmol/L — AB (ref 135–145)
Sodium: 127 mmol/L — ABNORMAL LOW (ref 135–145)

## 2015-02-18 MED ORDER — TORSEMIDE 20 MG PO TABS
40.0000 mg | ORAL_TABLET | Freq: Every day | ORAL | Status: DC
  Administered 2015-02-18 – 2015-02-19 (×2): 40 mg via ORAL
  Filled 2015-02-18 (×2): qty 2

## 2015-02-18 MED ORDER — PREDNISONE 20 MG PO TABS
40.0000 mg | ORAL_TABLET | Freq: Every day | ORAL | Status: DC
  Administered 2015-02-18 – 2015-02-19 (×2): 40 mg via ORAL
  Filled 2015-02-18 (×2): qty 2

## 2015-02-18 NOTE — Progress Notes (Addendum)
Patient ambulated in hall approximately 250 ft, with standby assistance, tolerated well.  Pt on 2L O2, respiratory effort WNL, no dyspnea with exertion.  Encouraged patient to call out when he wanted to walk around the unit.

## 2015-02-18 NOTE — Progress Notes (Signed)
Subjective: Patient is resting. He feels better. [name] being evaluated by nephrology.  Objective: Vital signs in last 24 hours: Temp:  [98.2 F (36.8 C)-98.8 F (37.1 C)] 98.2 F (36.8 C) (03/13 0704) Pulse Rate:  [78-81] 78 (03/13 0704) Resp:  [18-20] 20 (03/13 0704) BP: (121-133)/(36-46) 122/36 mmHg (03/13 0704) SpO2:  [94 %-100 %] 94 % (03/13 0810) Weight:  [107.956 kg (238 lb)] 107.956 kg (238 lb) (03/13 0704) Weight change:  Last BM Date: 02/18/15  Intake/Output from previous day: 03/12 0701 - 03/13 0700 In: 720 [P.O.:720] Out: 1075 [Urine:1075]  PHYSICAL EXAM General appearance: alert, no distress and moderately obese Resp: diminished breath sounds bilaterally and rhonchi bilaterally Cardio: S1, S2 normal GI: soft, non-tender; bowel sounds normal; no masses,  no organomegaly Extremities: extremities normal, atraumatic, no cyanosis or edema  Lab Results:  Results for orders placed or performed during the hospital encounter of 02/14/15 (from the past 48 hour(s))  Glucose, capillary     Status: Abnormal   Collection Time: 02/16/15 11:46 AM  Result Value Ref Range   Glucose-Capillary 154 (H) 70 - 99 mg/dL  Glucose, capillary     Status: None   Collection Time: 02/16/15  4:53 PM  Result Value Ref Range   Glucose-Capillary 99 70 - 99 mg/dL  Glucose, capillary     Status: Abnormal   Collection Time: 02/16/15  9:17 PM  Result Value Ref Range   Glucose-Capillary 121 (H) 70 - 99 mg/dL  Basic metabolic panel     Status: Abnormal   Collection Time: 02/17/15 12:04 AM  Result Value Ref Range   Sodium 131 (L) 135 - 145 mmol/L   Potassium 4.2 3.5 - 5.1 mmol/L   Chloride 96 96 - 112 mmol/L   CO2 27 19 - 32 mmol/L   Glucose, Bld 117 (H) 70 - 99 mg/dL   BUN 80 (H) 6 - 23 mg/dL   Creatinine, Ser 2.44 (H) 0.50 - 1.35 mg/dL   Calcium 8.5 8.4 - 10.5 mg/dL   GFR calc non Af Amer 25 (L) >90 mL/min   GFR calc Af Amer 29 (L) >90 mL/min    Comment: (NOTE) The eGFR has been  calculated using the CKD EPI equation. This calculation has not been validated in all clinical situations. eGFR's persistently <90 mL/min signify possible Chronic Kidney Disease.    Anion gap 8 5 - 15  Basic metabolic panel     Status: Abnormal   Collection Time: 02/17/15  7:27 AM  Result Value Ref Range   Sodium 131 (L) 135 - 145 mmol/L   Potassium 4.1 3.5 - 5.1 mmol/L   Chloride 95 (L) 96 - 112 mmol/L   CO2 27 19 - 32 mmol/L   Glucose, Bld 103 (H) 70 - 99 mg/dL   BUN 83 (H) 6 - 23 mg/dL   Creatinine, Ser 2.38 (H) 0.50 - 1.35 mg/dL   Calcium 8.4 8.4 - 10.5 mg/dL   GFR calc non Af Amer 26 (L) >90 mL/min   GFR calc Af Amer 30 (L) >90 mL/min    Comment: (NOTE) The eGFR has been calculated using the CKD EPI equation. This calculation has not been validated in all clinical situations. eGFR's persistently <90 mL/min signify possible Chronic Kidney Disease.    Anion gap 9 5 - 15  Glucose, capillary     Status: Abnormal   Collection Time: 02/17/15  7:49 AM  Result Value Ref Range   Glucose-Capillary 103 (H) 70 - 99 mg/dL  Glucose, capillary     Status: Abnormal   Collection Time: 02/17/15 11:29 AM  Result Value Ref Range   Glucose-Capillary 166 (H) 70 - 99 mg/dL   Comment 1 Notify RN   Glucose, capillary     Status: None   Collection Time: 02/17/15  4:16 PM  Result Value Ref Range   Glucose-Capillary 74 70 - 99 mg/dL   Comment 1 Notify RN    Comment 2 Document in Chart   Glucose, capillary     Status: Abnormal   Collection Time: 02/17/15 10:10 PM  Result Value Ref Range   Glucose-Capillary 146 (H) 70 - 99 mg/dL   Comment 1 Notify RN    Comment 2 Document in Chart   Basic metabolic panel     Status: Abnormal   Collection Time: 02/18/15  3:37 AM  Result Value Ref Range   Sodium 127 (L) 135 - 145 mmol/L   Potassium 4.1 3.5 - 5.1 mmol/L   Chloride 96 96 - 112 mmol/L   CO2 24 19 - 32 mmol/L   Glucose, Bld 89 70 - 99 mg/dL   BUN 80 (H) 6 - 23 mg/dL   Creatinine, Ser 2.19  (H) 0.50 - 1.35 mg/dL   Calcium 8.1 (L) 8.4 - 10.5 mg/dL   GFR calc non Af Amer 29 (L) >90 mL/min   GFR calc Af Amer 33 (L) >90 mL/min    Comment: (NOTE) The eGFR has been calculated using the CKD EPI equation. This calculation has not been validated in all clinical situations. eGFR's persistently <90 mL/min signify possible Chronic Kidney Disease.    Anion gap 7 5 - 15  Basic metabolic panel     Status: Abnormal   Collection Time: 02/18/15  6:11 AM  Result Value Ref Range   Sodium 129 (L) 135 - 145 mmol/L   Potassium 4.0 3.5 - 5.1 mmol/L   Chloride 97 96 - 112 mmol/L   CO2 24 19 - 32 mmol/L   Glucose, Bld 95 70 - 99 mg/dL   BUN 83 (H) 6 - 23 mg/dL   Creatinine, Ser 2.23 (H) 0.50 - 1.35 mg/dL   Calcium 8.1 (L) 8.4 - 10.5 mg/dL   GFR calc non Af Amer 28 (L) >90 mL/min   GFR calc Af Amer 33 (L) >90 mL/min    Comment: (NOTE) The eGFR has been calculated using the CKD EPI equation. This calculation has not been validated in all clinical situations. eGFR's persistently <90 mL/min signify possible Chronic Kidney Disease.    Anion gap 8 5 - 15  Glucose, capillary     Status: Abnormal   Collection Time: 02/18/15  7:36 AM  Result Value Ref Range   Glucose-Capillary 104 (H) 70 - 99 mg/dL   Comment 1 Notify RN    Comment 2 Document in Chart     ABGS No results for input(s): PHART, PO2ART, TCO2, HCO3 in the last 72 hours.  Invalid input(s): PCO2 CULTURES Recent Results (from the past 240 hour(s))  MRSA PCR Screening     Status: Abnormal   Collection Time: 02/15/15  2:40 AM  Result Value Ref Range Status   MRSA by PCR POSITIVE (A) NEGATIVE Final    Comment:        The GeneXpert MRSA Assay (FDA approved for NASAL specimens only), is one component of a comprehensive MRSA colonization surveillance program. It is not intended to diagnose MRSA infection nor to guide or monitor treatment for MRSA infections. RESULT CALLED TO,  READ BACK BY AND VERIFIED WITH: WOODS K AT 0414 ON  [id number] BY FORSYTH K    Studies/Results: No results found.  Medications: I have reviewed the patient's current medications.  Assesment:   Principal Problem:   Shortness of breath Active Problems:   Hypertension   Hypothyroid   Diabetes   SOB (shortness of breath)   Respiratory distress   COPD exacerbation   Acute on chronic systolic CHF (congestive heart failure)   Seizures   Craniopharyngioma   Essential hypertension chronic CKD III  Plan:  Medications reviewed Will monitor [facility] Nephrology consult appreciated    LOS: 4 days   [name] [name] 02/18/2015, 9:58 AM
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
## 2015-02-19 LAB — GLUCOSE, CAPILLARY: GLUCOSE-CAPILLARY: 109 mg/dL — AB (ref 70–99)

## 2015-02-19 LAB — BASIC METABOLIC PANEL
Anion gap: 8 (ref 5–15)
BUN: 84 mg/dL — ABNORMAL HIGH (ref 6–23)
CALCIUM: 8.4 mg/dL (ref 8.4–10.5)
CO2: 26 mmol/L (ref 19–32)
Chloride: 97 mmol/L (ref 96–112)
Creatinine, Ser: 2.17 mg/dL — ABNORMAL HIGH (ref 0.50–1.35)
GFR calc Af Amer: 34 mL/min — ABNORMAL LOW (ref >90)
GFR, EST NON AFRICAN AMERICAN: 29 mL/min — AB (ref >90)
GLUCOSE: 87 mg/dL (ref 70–99)
Potassium: 3.7 mmol/L (ref 3.5–5.1)
Sodium: 131 mmol/L — ABNORMAL LOW (ref 135–145)

## 2015-02-19 LAB — CBC
HCT: 33.1 % — ABNORMAL LOW (ref 39.0–52.0)
HEMOGLOBIN: 10.6 g/dL — AB (ref 13.0–17.0)
MCH: 29 pg (ref 26.0–34.0)
MCHC: 32 g/dL (ref 30.0–36.0)
MCV: 90.4 fL (ref 78.0–100.0)
Platelets: 177 10*3/uL (ref 150–400)
RBC: 3.66 MIL/uL — ABNORMAL LOW (ref 4.22–5.81)
RDW: 18.8 % — ABNORMAL HIGH (ref 11.5–15.5)
WBC: 9.9 10*3/uL (ref 4.0–10.5)

## 2015-02-19 LAB — PHOSPHORUS: PHOSPHORUS: 3.8 mg/dL (ref 2.3–4.6)

## 2015-02-19 MED ORDER — TORSEMIDE 20 MG PO TABS: 40.0000 mg | ORAL_TABLET | Freq: Every day | ORAL | Status: DC

## 2015-02-19 MED ORDER — PREDNISONE (PAK) 10 MG PO TABS: ORAL_TABLET | Freq: Every day | ORAL | Status: DC

## 2015-02-19 NOTE — Progress Notes (Signed)
Subjective: Interval History: has no complaint of nausea or vomiting. Patient also denies any difficulty in breathing. His appetite is good.  Objective: Vital signs in last 24 hours: Temp:  [98 F (36.7 C)-98.5 F (36.9 C)] 98.1 F (36.7 C) (03/14 0645) Pulse Rate:  [74-82] 82 (03/14 0645) Resp:  [18-20] 20 (03/14 0645) BP: (106-128)/(38-44) 125/38 mmHg (03/14 0645) SpO2:  [97 %-98 %] 97 % (03/14 0746) Weight:  [109.77 kg (242 lb)] 109.77 kg (242 lb) (03/14 0500) Weight change: 1.814 kg (4 lb)  Intake/Output from previous day: 03/13 0701 - 03/14 0700 In: 720 [P.O.:720] Out: 3725 [Urine:3725] Intake/Output this shift:    General appearance: alert, cooperative and no distress Resp: clear to auscultation bilaterally Cardio: regular rate and rhythm, S1, S2 normal, no murmur, click, rub or gallop GI: soft, non-tender; bowel sounds normal; no masses,  no organomegaly Extremities: edema 1+ edema bilaterally  Lab Results:  Recent Labs  02/19/15 0631  WBC 9.9  HGB 10.6*  HCT 33.1*  PLT 177   BMET:  Recent Labs  02/18/15 0611 02/19/15 0631  NA 129* 131*  K 4.0 3.7  CL 97 97  CO2 24 26  GLUCOSE 95 87  BUN 83* 84*  CREATININE 2.23* 2.17*  CALCIUM 8.1* 8.4   No results for input(s): PTH in the last 72 hours. Iron Studies: No results for input(s): IRON, TIBC, TRANSFERRIN, FERRITIN in the last 72 hours.  Studies/Results: No results found.  I have reviewed the patient's current medications.  Assessment/Plan: Problem #1 renal failure chronic. Presently stage IV. His BUN and creatinine is slightly better. His potassium is normal. Problem #2 difficulty in breathing. Patient at this moment is feeling much better. [name] on Demadex and he has 3700 mL of urine output. Problem #3 hyponatremia: Sodium is 131 improved Problem #4 anemia: His hemoglobin and hematocrit is range Problem #5 history of hypertension: His blood pressure is reasonably controlled Problem #6  hypothyroidism Problem #7 diabetes  Plan: 1] Continue to take Demadex on a when necessary basis 2] patient advised to keep his previous office appointment.   LOS: 5 days   Lakisa Lotz S 02/19/2015,9:08 AM

## 2015-02-19 NOTE — Discharge Summary (Signed)
Physician Discharge Summary  Patient ID: [name] [name] MRN: [id number] DOB/AGE: December 08, 1946 69 y.o. Primary Care Physician:Bianna Haran, MD Admit date: 02/14/2015 Discharge date: 02/19/2015    Discharge Diagnoses:   Principal Problem:   Shortness of breath Active Problems:   Hypertension   Hypothyroid   Diabetes   SOB (shortness of breath)   Respiratory distress   COPD exacerbation   Acute on chronic systolic CHF (congestive heart failure)   Seizures   Craniopharyngioma   Essential hypertension  CKD III   Medication List    STOP taking these medications        furosemide 40 MG tablet  Commonly known as:  LASIX     metolazone 10 MG tablet  Commonly known as:  ZAROXOLYN      TAKE these medications        albuterol (2.5 MG/3ML) 0.083% nebulizer solution  Commonly known as:  PROVENTIL  Take 3 mLs (2.5 mg total) by nebulization every 4 (four) hours as needed for wheezing or shortness of breath.     aspirin 325 MG EC tablet  Take 1 tablet (325 mg total) by mouth daily.     budesonide-formoterol 160-4.5 MCG/ACT inhaler  Commonly known as:  SYMBICORT  Inhale 2 puffs into the lungs 2 (two) times daily.     camphor-menthol lotion  Commonly known as:  SARNA  Apply topically as needed for itching.     cloNIDine 0.2 MG tablet  Commonly known as:  CATAPRES  Take 1 tablet (0.2 mg total) by mouth 3 (three) times daily.     desmopressin 0.2 MG tablet  Commonly known as:  DDAVP  Take 1 tablet (0.2 mg total) by mouth 2 (two) times daily.     dexamethasone 0.5 MG tablet  Commonly known as:  DECADRON  Take 0.5 mg by mouth daily.     docusate sodium 100 MG capsule  Commonly known as:  COLACE  Take 100 mg by mouth 2 (two) times daily.     HYDROcodone-acetaminophen 5-325 MG per tablet  Commonly known as:  NORCO/VICODIN  Take two tablets by mouth every 6 hours as needed for pain. Max APAP 3gm/24hr from all sources     hydrocortisone 2.5 % rectal cream  Commonly known  as:  PROCTOSOL HC  Place 1 application rectally 2 (two) times daily. Do not use for longer than 7 days at a time.     insulin aspart 100 UNIT/ML injection  Commonly known as:  novoLOG  Inject 0-9 Units into the skin 3 (three) times daily with meals.     ipratropium 0.02 % nebulizer solution  Commonly known as:  ATROVENT  Take 0.5 mg by nebulization 4 (four) times daily.     levothyroxine 175 MCG tablet  Commonly known as:  SYNTHROID, LEVOTHROID  Take 175 mcg by mouth daily before breakfast.     loratadine 10 MG tablet  Commonly known as:  CLARITIN  Take 10 mg by mouth daily as needed for allergies.     mometasone 50 MCG/ACT nasal spray  Commonly known as:  NASONEX  Place 2 sprays into the nose daily.     omeprazole 20 MG capsule  Commonly known as:  PRILOSEC  Take 20 mg by mouth daily.     ondansetron 4 MG tablet  Commonly known as:  ZOFRAN  Take 1 tablet (4 mg total) by mouth every 8 (eight) hours as needed for nausea or vomiting.     peg 3350 powder 100 G Solr  Commonly known as:  MOVIPREP  Take 1 kit (200 g total) by mouth as directed.     phenytoin 100 MG ER capsule  Commonly known as:  DILANTIN  Take 200-300 mg by mouth daily. Takes 200 mg on Monday and Thursdsay. On all other days of the week takes 300 mg.     polyethylene glycol powder powder  Commonly known as:  GLYCOLAX/MIRALAX  Take 17 g by mouth daily. May decrease to 17 g three times a week if needed.     predniSONE 10 MG tablet  Commonly known as:  STERAPRED UNI-PAK  Take by mouth daily. 40 mg po daily for 3 days, then 30 mg po daily for 3 days, then 20 mg po daily for 3 days, then 10 mg po daily for 3 days.     simvastatin 80 MG tablet  Commonly known as:  ZOCOR  Take 80 mg by mouth daily.     sodium bicarbonate 650 MG tablet  Take 1 tablet (650 mg total) by mouth 2 (two) times daily.     torsemide 20 MG tablet  Commonly known as:  DEMADEX  Take 2 tablets (40 mg total) by mouth daily.         Discharged Condition: improved    Consults: pulmonary and nephrology  Significant Diagnostic Studies: Dg Chest Port 1 View  02/14/2015   CLINICAL DATA:  Shortness of breath, cough  EXAM: PORTABLE CHEST - 1 VIEW  COMPARISON:  01/13/2015  FINDINGS: Cardiomegaly again noted. No acute infiltrate or pleural effusion. No pulmonary edema. Mild degenerative changes thoracic spine.  IMPRESSION: Cardiomegaly.  No active disease.   Electronically Signed   By: Lahoma Crocker M.D.   On: 02/14/2015 16:26    Lab Results: Basic Metabolic Panel:  Recent Labs  02/18/15 0611 02/19/15 0106 02/19/15 0631  NA 129*  --  131*  K 4.0  --  3.7  CL 97  --  97  CO2 24  --  26  GLUCOSE 95  --  87  BUN 83*  --  84*  CREATININE 2.23*  --  2.17*  CALCIUM 8.1*  --  8.4  PHOS  --  3.8  --    Liver Function Tests: No results for input(s): AST, ALT, ALKPHOS, BILITOT, PROT, ALBUMIN in the last 72 hours.   CBC:  Recent Labs  02/19/15 0631  WBC 9.9  HGB 10.6*  HCT 33.1*  MCV 90.4  PLT 177    Recent Results (from the past 240 hour(s))  MRSA PCR Screening     Status: Abnormal   Collection Time: 02/15/15  2:40 AM  Result Value Ref Range Status   MRSA by PCR POSITIVE (A) NEGATIVE Final    Comment:        The GeneXpert MRSA Assay (FDA approved for NASAL specimens only), is one component of a comprehensive MRSA colonization surveillance program. It is not intended to diagnose MRSA infection nor to guide or monitor treatment for MRSA infections. RESULT CALLED TO, READ BACK BY AND VERIFIED WITH: [facility] [place] AT [place] [id number] BY [name] Course:   This is a 69 years old male patient with history of multiple medical illnesses was admitted due to shortness of secondary to exacerbation of COPD. Patient also has CHF and chronic renal failure. He was treated with IV steroid and nebulizer. Patient improved over the hospital stay. [name] back to his baseline. He will discharge d home with  home  health.  Discharge Exam: Blood pressure 125/38, pulse 82, temperature 98.1 F (36.7 C), temperature source Axillary, resp. rate 20, height 6' 1"  (1.854 m), weight 109.77 kg (242 lb), SpO2 97 %.   Disposition:  Home.        Follow-up Information    Follow up with [facility].   Contact information:   [place] [address] [phone]       Follow up with [facility], MD.   Specialty:  Internal Medicine   Contact information:   [place] [address] [phone]       Follow up with [facility], MD In 2 weeks.   Specialty:  Internal Medicine   Contact information:   [place] [address] [phone]       Signed: [name]   02/19/2015, 8:29 AM

## 2015-02-19 NOTE — Progress Notes (Signed)
Reviewed discharge instructions with patient, answered questions.  IV removed, belongings packed for patient.  Awaiting ride at this time, will continue to monitor until leaves the floor.

## 2015-02-19 NOTE — Progress Notes (Signed)
UR chart review completed.  

## 2015-03-02 ENCOUNTER — Emergency Department (HOSPITAL_COMMUNITY): Payer: Medicare HMO

## 2015-03-02 ENCOUNTER — Encounter (HOSPITAL_COMMUNITY): Payer: Self-pay

## 2015-03-02 ENCOUNTER — Other Ambulatory Visit (HOSPITAL_COMMUNITY): Payer: Self-pay

## 2015-03-02 ENCOUNTER — Inpatient Hospital Stay (HOSPITAL_COMMUNITY)
Admission: EM | Admit: 2015-03-02 | Discharge: 2015-03-10 | DRG: 378 | Disposition: A | Discharge status: Home / Self Care | Payer: Medicare HMO | Attending: Internal Medicine | Admitting: Internal Medicine

## 2015-03-02 DIAGNOSIS — E039 Hypothyroidism, unspecified: Secondary | ICD-10-CM | POA: Diagnosis present

## 2015-03-02 DIAGNOSIS — K644 Residual hemorrhoidal skin tags: Secondary | ICD-10-CM | POA: Diagnosis present

## 2015-03-02 DIAGNOSIS — E785 Hyperlipidemia, unspecified: Secondary | ICD-10-CM | POA: Diagnosis present

## 2015-03-02 DIAGNOSIS — Z7952 Long term (current) use of systemic steroids: Secondary | ICD-10-CM

## 2015-03-02 DIAGNOSIS — Z7982 Long term (current) use of aspirin: Secondary | ICD-10-CM

## 2015-03-02 DIAGNOSIS — Z833 Family history of diabetes mellitus: Secondary | ICD-10-CM

## 2015-03-02 DIAGNOSIS — I129 Hypertensive chronic kidney disease with stage 1 through stage 4 chronic kidney disease, or unspecified chronic kidney disease: Secondary | ICD-10-CM | POA: Diagnosis present

## 2015-03-02 DIAGNOSIS — K2970 Gastritis, unspecified, without bleeding: Secondary | ICD-10-CM | POA: Diagnosis present

## 2015-03-02 DIAGNOSIS — Z794 Long term (current) use of insulin: Secondary | ICD-10-CM

## 2015-03-02 DIAGNOSIS — K648 Other hemorrhoids: Secondary | ICD-10-CM | POA: Diagnosis present

## 2015-03-02 DIAGNOSIS — K921 Melena: Secondary | ICD-10-CM | POA: Diagnosis present

## 2015-03-02 DIAGNOSIS — E119 Type 2 diabetes mellitus without complications: Secondary | ICD-10-CM | POA: Diagnosis present

## 2015-03-02 DIAGNOSIS — K3189 Other diseases of stomach and duodenum: Secondary | ICD-10-CM | POA: Diagnosis not present

## 2015-03-02 DIAGNOSIS — Z809 Family history of malignant neoplasm, unspecified: Secondary | ICD-10-CM | POA: Diagnosis not present

## 2015-03-02 DIAGNOSIS — N183 Chronic kidney disease, stage 3 (moderate): Secondary | ICD-10-CM | POA: Diagnosis present

## 2015-03-02 DIAGNOSIS — Z79891 Long term (current) use of opiate analgesic: Secondary | ICD-10-CM

## 2015-03-02 DIAGNOSIS — J449 Chronic obstructive pulmonary disease, unspecified: Secondary | ICD-10-CM | POA: Diagnosis present

## 2015-03-02 DIAGNOSIS — I10 Essential (primary) hypertension: Secondary | ICD-10-CM | POA: Diagnosis present

## 2015-03-02 DIAGNOSIS — K2980 Duodenitis without bleeding: Secondary | ICD-10-CM | POA: Diagnosis not present

## 2015-03-02 DIAGNOSIS — I509 Heart failure, unspecified: Secondary | ICD-10-CM | POA: Diagnosis present

## 2015-03-02 DIAGNOSIS — Z905 Acquired absence of kidney: Secondary | ICD-10-CM | POA: Diagnosis present

## 2015-03-02 DIAGNOSIS — Z7951 Long term (current) use of inhaled steroids: Secondary | ICD-10-CM | POA: Diagnosis not present

## 2015-03-02 DIAGNOSIS — G4730 Sleep apnea, unspecified: Secondary | ICD-10-CM | POA: Diagnosis present

## 2015-03-02 DIAGNOSIS — Z8249 Family history of ischemic heart disease and other diseases of the circulatory system: Secondary | ICD-10-CM

## 2015-03-02 DIAGNOSIS — E230 Hypopituitarism: Secondary | ICD-10-CM | POA: Diagnosis present

## 2015-03-02 DIAGNOSIS — K922 Gastrointestinal hemorrhage, unspecified: Secondary | ICD-10-CM | POA: Diagnosis not present

## 2015-03-02 DIAGNOSIS — D62 Acute posthemorrhagic anemia: Secondary | ICD-10-CM | POA: Diagnosis present

## 2015-03-02 DIAGNOSIS — Z87891 Personal history of nicotine dependence: Secondary | ICD-10-CM | POA: Diagnosis not present

## 2015-03-02 DIAGNOSIS — D6959 Other secondary thrombocytopenia: Secondary | ICD-10-CM | POA: Insufficient documentation

## 2015-03-02 DIAGNOSIS — B3781 Candidal esophagitis: Secondary | ICD-10-CM | POA: Diagnosis not present

## 2015-03-02 LAB — COMPREHENSIVE METABOLIC PANEL
ALT: 34 U/L (ref 0–53)
AST: 21 U/L (ref 0–37)
Albumin: 3.1 g/dL — ABNORMAL LOW (ref 3.5–5.2)
Alkaline Phosphatase: 53 U/L (ref 39–117)
Anion gap: 9 (ref 5–15)
BUN: 108 mg/dL — ABNORMAL HIGH (ref 6–23)
CO2: 24 mmol/L (ref 19–32)
CREATININE: 2.42 mg/dL — AB (ref 0.50–1.35)
Calcium: 7.9 mg/dL — ABNORMAL LOW (ref 8.4–10.5)
Chloride: 107 mmol/L (ref 96–112)
GFR, EST AFRICAN AMERICAN: 30 mL/min — AB (ref >90)
GFR, EST NON AFRICAN AMERICAN: 25 mL/min — AB (ref >90)
GLUCOSE: 126 mg/dL — AB (ref 70–99)
Potassium: 3.8 mmol/L (ref 3.5–5.1)
Sodium: 140 mmol/L (ref 135–145)
Total Bilirubin: 0.4 mg/dL (ref 0.3–1.2)
Total Protein: 5.9 g/dL — ABNORMAL LOW (ref 6.0–8.3)

## 2015-03-02 LAB — POC OCCULT BLOOD, ED: Fecal Occult Bld: POSITIVE — AB

## 2015-03-02 LAB — URINALYSIS, ROUTINE W REFLEX MICROSCOPIC
Bilirubin Urine: NEGATIVE
GLUCOSE, UA: NEGATIVE mg/dL
Hgb urine dipstick: NEGATIVE
KETONES UR: NEGATIVE mg/dL
LEUKOCYTES UA: NEGATIVE
Nitrite: NEGATIVE
Protein, ur: NEGATIVE mg/dL
Specific Gravity, Urine: 1.01 (ref 1.005–1.030)
UROBILINOGEN UA: 0.2 mg/dL (ref 0.0–1.0)
pH: 5.5 (ref 5.0–8.0)

## 2015-03-02 LAB — PROTIME-INR
INR: 1.29 (ref 0.00–1.49)
PROTHROMBIN TIME: 16.2 s — AB (ref 11.6–15.2)

## 2015-03-02 LAB — CBC WITH DIFFERENTIAL/PLATELET
Basophils Absolute: 0 10*3/uL (ref 0.0–0.1)
Basophils Relative: 0 % (ref 0–1)
EOS PCT: 2 % (ref 0–5)
Eosinophils Absolute: 0.2 10*3/uL (ref 0.0–0.7)
HEMATOCRIT: 24.3 % — AB (ref 39.0–52.0)
HEMOGLOBIN: 7.8 g/dL — AB (ref 13.0–17.0)
LYMPHS ABS: 1 10*3/uL (ref 0.7–4.0)
LYMPHS PCT: 10 % — AB (ref 12–46)
MCH: 29.7 pg (ref 26.0–34.0)
MCHC: 32.1 g/dL (ref 30.0–36.0)
MCV: 92.4 fL (ref 78.0–100.0)
MONO ABS: 0.9 10*3/uL (ref 0.1–1.0)
MONOS PCT: 9 % (ref 3–12)
NEUTROS ABS: 7.7 10*3/uL (ref 1.7–7.7)
Neutrophils Relative %: 79 % — ABNORMAL HIGH (ref 43–77)
Platelets: 143 10*3/uL — ABNORMAL LOW (ref 150–400)
RBC: 2.63 MIL/uL — ABNORMAL LOW (ref 4.22–5.81)
RDW: 19.5 % — AB (ref 11.5–15.5)
WBC: 9.8 10*3/uL (ref 4.0–10.5)

## 2015-03-02 LAB — CBG MONITORING, ED: GLUCOSE-CAPILLARY: 128 mg/dL — AB (ref 70–99)

## 2015-03-02 MED ORDER — SODIUM CHLORIDE 0.9 % IV BOLUS (SEPSIS)
1000.0000 mL | Freq: Once | INTRAVENOUS | Status: AC
  Administered 2015-03-02: 1000 mL via INTRAVENOUS

## 2015-03-02 MED ORDER — SODIUM CHLORIDE 0.9 % IV SOLN
8.0000 mg/h | INTRAVENOUS | Status: AC
  Administered 2015-03-02 – 2015-03-04 (×4): 8 mg/h via INTRAVENOUS
  Filled 2015-03-02 (×9): qty 80

## 2015-03-02 MED ORDER — PANTOPRAZOLE SODIUM 40 MG IV SOLR
INTRAVENOUS | Status: AC
  Filled 2015-03-02: qty 160

## 2015-03-02 MED ORDER — SODIUM CHLORIDE 0.9 % IV SOLN
80.0000 mg | Freq: Once | INTRAVENOUS | Status: AC
  Administered 2015-03-02: 80 mg via INTRAVENOUS
  Filled 2015-03-02: qty 80
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
## 2015-03-02 NOTE — ED Notes (Signed)
#  2 large melana stool

## 2015-03-02 NOTE — ED Notes (Signed)
Pt in by [facility] after apparently falling, possibly having a syncopal episode vs seizure.  Pt does not know why he ended up on the floor, when he came to he pushed his medic alert button.  Pt states he has been passing runny black stools since yesterday.  Pt also c/o chest pain

## 2015-03-02 NOTE — ED Notes (Signed)
cbg of 128. 

## 2015-03-02 NOTE — ED Notes (Signed)
Full bedpan of black liquid with clots. Pt denies any cramping abd pain at this time

## 2015-03-03 ENCOUNTER — Encounter (HOSPITAL_COMMUNITY)
Admission: EM | Disposition: A | Discharge status: Home / Self Care | Payer: Self-pay | Source: Home / Self Care | Attending: Internal Medicine

## 2015-03-03 ENCOUNTER — Encounter (HOSPITAL_COMMUNITY): Payer: Self-pay | Admitting: Internal Medicine

## 2015-03-03 DIAGNOSIS — D62 Acute posthemorrhagic anemia: Secondary | ICD-10-CM | POA: Diagnosis present

## 2015-03-03 DIAGNOSIS — K922 Gastrointestinal hemorrhage, unspecified: Secondary | ICD-10-CM

## 2015-03-03 DIAGNOSIS — B3781 Candidal esophagitis: Secondary | ICD-10-CM

## 2015-03-03 DIAGNOSIS — I10 Essential (primary) hypertension: Secondary | ICD-10-CM

## 2015-03-03 DIAGNOSIS — K2980 Duodenitis without bleeding: Secondary | ICD-10-CM

## 2015-03-03 DIAGNOSIS — K3189 Other diseases of stomach and duodenum: Secondary | ICD-10-CM

## 2015-03-03 DIAGNOSIS — E230 Hypopituitarism: Secondary | ICD-10-CM

## 2015-03-03 DIAGNOSIS — Z7982 Long term (current) use of aspirin: Secondary | ICD-10-CM

## 2015-03-03 HISTORY — PX: ESOPHAGOGASTRODUODENOSCOPY: SHX5428

## 2015-03-03 LAB — CBC WITH DIFFERENTIAL/PLATELET
BASOS ABS: 0.1 10*3/uL (ref 0.0–0.1)
BASOS PCT: 1 % (ref 0–1)
Basophils Absolute: 0 10*3/uL (ref 0.0–0.1)
Basophils Relative: 0 % (ref 0–1)
EOS PCT: 3 % (ref 0–5)
Eosinophils Absolute: 0.3 10*3/uL (ref 0.0–0.7)
Eosinophils Absolute: 0.3 10*3/uL (ref 0.0–0.7)
Eosinophils Relative: 3 % (ref 0–5)
HCT: 22 % — ABNORMAL LOW (ref 39.0–52.0)
HCT: 22.6 % — ABNORMAL LOW (ref 39.0–52.0)
HEMOGLOBIN: 7.2 g/dL — AB (ref 13.0–17.0)
HEMOGLOBIN: 7.5 g/dL — AB (ref 13.0–17.0)
LYMPHS ABS: 0.9 10*3/uL (ref 0.7–4.0)
LYMPHS ABS: 1.5 10*3/uL (ref 0.7–4.0)
LYMPHS PCT: 14 % (ref 12–46)
Lymphocytes Relative: 8 % — ABNORMAL LOW (ref 12–46)
MCH: 30.3 pg (ref 26.0–34.0)
MCH: 31 pg (ref 26.0–34.0)
MCHC: 32.7 g/dL (ref 30.0–36.0)
MCHC: 33.2 g/dL (ref 30.0–36.0)
MCV: 92.4 fL (ref 78.0–100.0)
MCV: 93.4 fL (ref 78.0–100.0)
MONO ABS: 1 10*3/uL (ref 0.1–1.0)
MONOS PCT: 6 % (ref 3–12)
Monocytes Absolute: 0.7 10*3/uL (ref 0.1–1.0)
Monocytes Relative: 9 % (ref 3–12)
NEUTROS ABS: 7.8 10*3/uL — AB (ref 1.7–7.7)
Neutro Abs: 9.3 10*3/uL — ABNORMAL HIGH (ref 1.7–7.7)
Neutrophils Relative %: 73 % (ref 43–77)
Neutrophils Relative %: 83 % — ABNORMAL HIGH (ref 43–77)
PLATELETS: 134 10*3/uL — AB (ref 150–400)
Platelets: 98 10*3/uL — ABNORMAL LOW (ref 150–400)
RBC: 2.38 MIL/uL — AB (ref 4.22–5.81)
RBC: 2.42 MIL/uL — ABNORMAL LOW (ref 4.22–5.81)
RDW: 17.4 % — ABNORMAL HIGH (ref 11.5–15.5)
RDW: 19.6 % — ABNORMAL HIGH (ref 11.5–15.5)
WBC: 10.6 10*3/uL — ABNORMAL HIGH (ref 4.0–10.5)
WBC: 11.2 10*3/uL — AB (ref 4.0–10.5)

## 2015-03-03 LAB — COMPREHENSIVE METABOLIC PANEL
ALK PHOS: 33 U/L — AB (ref 39–117)
ALT: 28 U/L (ref 0–53)
AST: 19 U/L (ref 0–37)
Albumin: 2.8 g/dL — ABNORMAL LOW (ref 3.5–5.2)
Anion gap: 8 (ref 5–15)
BILIRUBIN TOTAL: 0.4 mg/dL (ref 0.3–1.2)
BUN: 115 mg/dL — ABNORMAL HIGH (ref 6–23)
CHLORIDE: 113 mmol/L — AB (ref 96–112)
CO2: 22 mmol/L (ref 19–32)
Calcium: 7.7 mg/dL — ABNORMAL LOW (ref 8.4–10.5)
Creatinine, Ser: 2.26 mg/dL — ABNORMAL HIGH (ref 0.50–1.35)
GFR calc non Af Amer: 28 mL/min — ABNORMAL LOW (ref >90)
GFR, EST AFRICAN AMERICAN: 32 mL/min — AB (ref >90)
Glucose, Bld: 92 mg/dL (ref 70–99)
Potassium: 4.3 mmol/L (ref 3.5–5.1)
Sodium: 143 mmol/L (ref 135–145)
TOTAL PROTEIN: 5.2 g/dL — AB (ref 6.0–8.3)

## 2015-03-03 LAB — ABO/RH: ABO/RH(D): B POS

## 2015-03-03 LAB — MRSA PCR SCREENING: MRSA by PCR: POSITIVE — AB

## 2015-03-03 LAB — PREPARE RBC (CROSSMATCH)

## 2015-03-03 LAB — GLUCOSE, CAPILLARY
GLUCOSE-CAPILLARY: 87 mg/dL (ref 70–99)
Glucose-Capillary: 96 mg/dL (ref 70–99)
Glucose-Capillary: 141 mg/dL — ABNORMAL HIGH (ref 70–99)

## 2015-03-03 LAB — KOH PREP: KOH Prep: NONE SEEN

## 2015-03-03 LAB — CBC
HCT: 20.2 % — ABNORMAL LOW (ref 39.0–52.0)
Hemoglobin: 6.4 g/dL — CL (ref 13.0–17.0)
MCH: 29.4 pg (ref 26.0–34.0)
MCHC: 31.7 g/dL (ref 30.0–36.0)
MCV: 92.7 fL (ref 78.0–100.0)
PLATELETS: 137 10*3/uL — AB (ref 150–400)
RBC: 2.18 MIL/uL — AB (ref 4.22–5.81)
RDW: 20 % — ABNORMAL HIGH (ref 11.5–15.5)
WBC: 10.6 10*3/uL — ABNORMAL HIGH (ref 4.0–10.5)

## 2015-03-03 LAB — PHENYTOIN LEVEL, TOTAL: Phenytoin Lvl: 3.7 ug/mL — ABNORMAL LOW (ref 10.0–20.0)

## 2015-03-03 SURGERY — EGD (ESOPHAGOGASTRODUODENOSCOPY)
Anesthesia: Moderate Sedation

## 2015-03-03 MED ORDER — ONDANSETRON HCL 4 MG/2ML IJ SOLN
4.0000 mg | Freq: Four times a day (QID) | INTRAMUSCULAR | Status: DC | PRN
  Administered 2015-03-03: 4 mg via INTRAVENOUS
  Filled 2015-03-03: qty 2

## 2015-03-03 MED ORDER — ACETAMINOPHEN 650 MG RE SUPP: 650.0000 mg | Freq: Four times a day (QID) | RECTAL | Status: DC | PRN

## 2015-03-03 MED ORDER — DIPHENHYDRAMINE HCL 25 MG PO CAPS
25.0000 mg | ORAL_CAPSULE | Freq: Once | ORAL | Status: AC
  Administered 2015-03-03: 25 mg via ORAL
  Filled 2015-03-03: qty 1

## 2015-03-03 MED ORDER — CLONIDINE HCL 0.2 MG PO TABS
0.2000 mg | ORAL_TABLET | Freq: Three times a day (TID) | ORAL | Status: DC
  Administered 2015-03-03 – 2015-03-10 (×22): 0.2 mg via ORAL
  Filled 2015-03-03 (×22): qty 1

## 2015-03-03 MED ORDER — MEPERIDINE HCL 50 MG/ML IJ SOLN
INTRAMUSCULAR | Status: DC | PRN
  Administered 2015-03-03: 25 mg via INTRAVENOUS

## 2015-03-03 MED ORDER — IPRATROPIUM BROMIDE 0.02 % IN SOLN
0.5000 mg | Freq: Four times a day (QID) | RESPIRATORY_TRACT | Status: DC
  Administered 2015-03-03 – 2015-03-08 (×21): 0.5 mg via RESPIRATORY_TRACT
  Filled 2015-03-03 (×21): qty 2.5

## 2015-03-03 MED ORDER — METHYLPREDNISOLONE SODIUM SUCC 40 MG IJ SOLR
20.0000 mg | Freq: Every day | INTRAMUSCULAR | Status: DC
  Administered 2015-03-03 – 2015-03-08 (×6): 20 mg via INTRAVENOUS
  Filled 2015-03-03 (×6): qty 1

## 2015-03-03 MED ORDER — ONDANSETRON HCL 4 MG PO TABS: 4.0000 mg | ORAL_TABLET | Freq: Four times a day (QID) | ORAL | Status: DC | PRN

## 2015-03-03 MED ORDER — ACETAMINOPHEN 325 MG PO TABS
650.0000 mg | ORAL_TABLET | Freq: Once | ORAL | Status: AC
  Administered 2015-03-03: 650 mg via ORAL
  Filled 2015-03-03: qty 2

## 2015-03-03 MED ORDER — ATORVASTATIN CALCIUM 40 MG PO TABS
40.0000 mg | ORAL_TABLET | Freq: Every day | ORAL | Status: DC
  Administered 2015-03-03 – 2015-03-09 (×7): 40 mg via ORAL
  Filled 2015-03-03 (×7): qty 1

## 2015-03-03 MED ORDER — MEPERIDINE HCL 50 MG/ML IJ SOLN
INTRAMUSCULAR | Status: AC
Start: 2015-03-03 — End: 2015-03-03
  Filled 2015-03-03: qty 1

## 2015-03-03 MED ORDER — SODIUM CHLORIDE 0.9 % IV SOLN: Freq: Once | INTRAVENOUS | Status: DC

## 2015-03-03 MED ORDER — FLUTICASONE PROPIONATE 50 MCG/ACT NA SUSP
2.0000 | Freq: Every day | NASAL | Status: DC
  Administered 2015-03-03 – 2015-03-10 (×6): 2 via NASAL
  Filled 2015-03-03 (×2): qty 16

## 2015-03-03 MED ORDER — LEVOTHYROXINE SODIUM 100 MCG IV SOLR
87.5000 ug | Freq: Every day | INTRAVENOUS | Status: DC
  Administered 2015-03-03 – 2015-03-06 (×4): 87.5 ug via INTRAVENOUS
  Filled 2015-03-03 (×5): qty 5

## 2015-03-03 MED ORDER — FUROSEMIDE 10 MG/ML IJ SOLN
20.0000 mg | Freq: Once | INTRAMUSCULAR | Status: AC
  Administered 2015-03-03: 20 mg via INTRAVENOUS
  Filled 2015-03-03: qty 2

## 2015-03-03 MED ORDER — SODIUM CHLORIDE 0.9 % IV SOLN
INTRAVENOUS | Status: DC
  Administered 2015-03-03 (×2): via INTRAVENOUS

## 2015-03-03 MED ORDER — BUTAMBEN-TETRACAINE-BENZOCAINE 2-2-14 % EX AERO
INHALATION_SPRAY | CUTANEOUS | Status: DC | PRN
  Administered 2015-03-03: 2 via TOPICAL

## 2015-03-03 MED ORDER — LORATADINE 10 MG PO TABS
10.0000 mg | ORAL_TABLET | Freq: Every day | ORAL | Status: DC
  Administered 2015-03-03 – 2015-03-10 (×8): 10 mg via ORAL
  Filled 2015-03-03 (×8): qty 1

## 2015-03-03 MED ORDER — BUDESONIDE-FORMOTEROL FUMARATE 160-4.5 MCG/ACT IN AERO
2.0000 | INHALATION_SPRAY | Freq: Two times a day (BID) | RESPIRATORY_TRACT | Status: DC
  Administered 2015-03-03 – 2015-03-10 (×15): 2 via RESPIRATORY_TRACT
  Filled 2015-03-03: qty 6

## 2015-03-03 MED ORDER — PHENYTOIN SODIUM EXTENDED 100 MG PO CAPS: 200.0000 mg | ORAL_CAPSULE | ORAL | Status: DC

## 2015-03-03 MED ORDER — PHENYTOIN SODIUM 50 MG/ML IJ SOLN
100.0000 mg | Freq: Three times a day (TID) | INTRAMUSCULAR | Status: DC
  Administered 2015-03-03 – 2015-03-06 (×10): 100 mg via INTRAVENOUS
  Filled 2015-03-03 (×10): qty 2

## 2015-03-03 MED ORDER — MUPIROCIN 2 % EX OINT
1.0000 "application " | TOPICAL_OINTMENT | Freq: Two times a day (BID) | CUTANEOUS | Status: AC
  Administered 2015-03-03 – 2015-03-07 (×10): 1 via NASAL
  Filled 2015-03-03: qty 22

## 2015-03-03 MED ORDER — MIDAZOLAM HCL 5 MG/5ML IJ SOLN
INTRAMUSCULAR | Status: DC | PRN
  Administered 2015-03-03: 1 mg via INTRAVENOUS
  Administered 2015-03-03: 2 mg via INTRAVENOUS

## 2015-03-03 MED ORDER — CHLORHEXIDINE GLUCONATE CLOTH 2 % EX PADS
6.0000 | MEDICATED_PAD | Freq: Every day | CUTANEOUS | Status: AC
  Administered 2015-03-03 – 2015-03-07 (×5): 6 via TOPICAL

## 2015-03-03 MED ORDER — ACETAMINOPHEN 325 MG PO TABS: 650.0000 mg | ORAL_TABLET | Freq: Four times a day (QID) | ORAL | Status: DC | PRN

## 2015-03-03 MED ORDER — DESMOPRESSIN ACETATE 0.2 MG PO TABS
0.2000 mg | ORAL_TABLET | Freq: Two times a day (BID) | ORAL | Status: DC
  Administered 2015-03-03 – 2015-03-10 (×16): 0.2 mg via ORAL
  Filled 2015-03-03 (×20): qty 1

## 2015-03-03 MED ORDER — MIDAZOLAM HCL 5 MG/5ML IJ SOLN
INTRAMUSCULAR | Status: AC
  Filled 2015-03-03: qty 10

## 2015-03-03 MED ORDER — PHENYTOIN SODIUM EXTENDED 100 MG PO CAPS
300.0000 mg | ORAL_CAPSULE | ORAL | Status: DC
Start: 2015-03-03 — End: 2015-03-03

## 2015-03-03 MED ORDER — DESMOPRESSIN ACETATE 0.2 MG PO TABS
ORAL_TABLET | ORAL | Status: AC
Start: 2015-03-03 — End: 2015-03-03
  Filled 2015-03-03: qty 1

## 2015-03-03 MED ORDER — PEG 3350-KCL-NA BICARB-NACL 420 G PO SOLR
4000.0000 mL | Freq: Once | ORAL | Status: AC
  Administered 2015-03-03: 4000 mL via ORAL
  Filled 2015-03-03 (×2): qty 4000

## 2015-03-03 MED ORDER — ALBUTEROL SULFATE (2.5 MG/3ML) 0.083% IN NEBU
2.5000 mg | INHALATION_SOLUTION | RESPIRATORY_TRACT | Status: DC | PRN
  Administered 2015-03-10: 2.5 mg via RESPIRATORY_TRACT
  Filled 2015-03-03 (×2): qty 3

## 2015-03-03 MED ORDER — IPRATROPIUM BROMIDE 0.02 % IN SOLN: 0.5000 mg | Freq: Four times a day (QID) | RESPIRATORY_TRACT | Status: DC

## 2015-03-03 MED ORDER — LEVOTHYROXINE SODIUM 75 MCG PO TABS: 175.0000 ug | ORAL_TABLET | Freq: Every day | ORAL | Status: DC

## 2015-03-03 NOTE — Progress Notes (Signed)
Dr Hal Hope updated on hgb 6.4 with orders for 2 prbc's to be given

## 2015-03-03 NOTE — Op Note (Signed)
EGD PROCEDURE REPORT  PATIENT:  [name] [name]  MR#:  [id number] Birthdate:  [birth year], 69 y.o., male Endoscopist:  Dr. Rogene Houston, MD Referred By:  Dr.  Virgel Manifold, MD and Dr. [name], MD Procedure Date: 03/03/2015  Procedure:   EGD  Indications: Patient is 69 year old African-American male with multiple medical problems who presents with history of melena and anemia. He also has history of rectal bleeding and was scheduled for colonoscopy over 2 weeks ago but he decided to cancel the procedure because he did not feel well (Dr. Gala Romney).            Informed Consent:  The risks, benefits, alternatives & imponderables which include, but are not limited to, bleeding, infection, perforation, drug reaction and potential missed lesion have been reviewed.  The potential for biopsy, lesion removal, esophageal dilation, etc. have also been discussed.  Questions have been answered.  All parties agreeable.  Please see history & physical in medical record for more information.  Medications:  Demerol 25 mg IV Versed 3 mg IV Cetacaine spray topically for oropharyngeal anesthesia  Description of procedure:  The endoscope was introduced through the mouth and advanced to the second portion of the duodenum without difficulty or limitations. The mucosal surfaces were surveyed very carefully during advancement of the scope and upon withdrawal.  Findings:  Esophagus:  Patchy coating of esophageal mucosa and body with white material. GE junction unremarkable. GEJ:  44 cm Stomach:  Stomach was empty and distended very well with insufflation. Folds in the proximal stomach were normal. Mucosa at gastric body and antrum was normal. Focal erythema and edema noted to mucosa at pyloric channel with deformity pyloric channel was patent. Angularis fundus and cardia were unremarkable. Duodenum:  Bulbar mucosa was normal. Edema and erythema noted to mucosa at angle of  duodenum but no erosions ulcers or other  abnormalities noted. Post bulbar mucosa was normal.  Therapeutic/Diagnostic Maneuvers Performed:  Brushing taken for KOH prep for esophagus.  Complications:  None  Impression: ? Mild Candida esophagitis. Brushing taken for KOH prep. Pyloric channel and post bulbar duodenitis but no bleeding lesions identified.  Comment; Source of GI bleed could be colonic or small bowel.  Recommendations:  H. pylori serology. Clear liquids. Colonoscopy in a.m. if patient agreeable.  Burak Zerbe U  03/03/2015  12:00 PM  CC: Dr. [name], MD & Dr. Rayne Du ref. provider found

## 2015-03-03 NOTE — Progress Notes (Signed)
Patient had melanotic stools presumed upper GI bleed had EGD this morning no focus of bleeding uncovered transfused 2 units of PRBCs waiting post transfusion hemoglobin and hematocrit denies chest pain dyspnea nausea vomiting denies bright red blood per rectum patient is scheduled for colonoscopy in a.m. [name] [name] QVZ:[id number] DOB: [birth year] DOA: 03/02/2015 PCP: [name] Fire, MD             Physical Exam: Blood pressure 131/46, pulse 80, temperature 99.2 F (37.3 C), temperature source Oral, resp. rate 12, height 6\' 1"  (1.854 m), weight 245 lb 2.4 oz (111.2 kg), SpO2 100 %. no JVD no carotid bruits no thyromegaly lungs clear to A&P no rales wheeze or rhonchi heart regular rhythm no murmurs Gallops heaves thrills or rubs abdomen soft nontender bowel sounds normoactive active  Investigations:  Recent Results (from the past 240 hour(s))  MRSA PCR Screening     Status: Abnormal   Collection Time: 03/03/15 12:30 AM  Result Value Ref Range Status   MRSA by PCR POSITIVE (A) NEGATIVE Final    Comment:        The GeneXpert MRSA Assay (FDA approved for NASAL specimens only), is one component of a comprehensive MRSA colonization surveillance program. It is not intended to diagnose MRSA infection nor to guide or monitor treatment for MRSA infections. RESULT CALLED TO, READ BACK BY AND VERIFIED WITH:  LEE,B @ 0308 ON 03/03/15 BY WOODIE,J   KOH prep     Status: None   Collection Time: 03/03/15 10:00 AM  Result Value Ref Range Status   Specimen Description BRONCH BRUSH TIP  Final   Special Requests NONE  Final   KOH Prep   Final    NO YEAST OR FUNGAL ELEMENTS SEEN Performed at [facility]    Report Status 03/03/2015 FINAL  Final     Basic Metabolic Panel:  Recent Labs  03/02/15 2140 03/03/15 0529  NA 140 143  K 3.8 4.3  CL 107 113*  CO2 24 22  GLUCOSE 126* 92  BUN 108* 115*  CREATININE 2.42* 2.26*  CALCIUM 7.9* 7.7*   Liver Function Tests:  Recent  Labs  03/02/15 2140 03/03/15 0529  AST 21 19  ALT 34 28  ALKPHOS 53 33*  BILITOT 0.4 0.4  PROT 5.9* 5.2*  ALBUMIN 3.1* 2.8*     CBC:  Recent Labs  03/02/15 2140 03/03/15 0020 03/03/15 0529  WBC 9.8 10.6* 10.6*  NEUTROABS 7.7 7.8*  --   HGB 7.8* 7.2* 6.4*  HCT 24.3* 22.0* 20.2*  MCV 92.4 92.4 92.7  PLT 143* 134* 137*    Dg Chest 1 View  03/02/2015   CLINICAL DATA:  Syncope. Generalized abdominal pain, nausea, black loose stools for 2 days. History of GI bleed.  EXAM: CHEST  1 VIEW  COMPARISON:  02/14/2015  FINDINGS: Cardiomegaly. Lungs are clear. No effusions. No acute bony abnormality. No edema.  IMPRESSION: Cardiomegaly.  No active disease.   Electronically Signed   By: Rolm Baptise M.D.   On: 03/02/2015 22:20      Medications:   Impression: Chronic renal failure Hypoproteinemia  Principal Problem:   Upper GI bleed Active Problems:   Hypertension   Hypothyroid   Panhypopituitarism   Acute blood loss anemia   GI bleed   Bleeding gastrointestinal     Plan: Continue IV Protonix clear liquid diet monitor hemoglobin and hematocrit every 8 hours   Consultants: Gastroenterology   Procedures EGD this a.m. unrevealing   Antibiotics:  Code Status:   Family Communication:    Disposition Plan serial hemoglobin and hematocrit colonoscopy in a.m.  Time spent: 30 minutes   LOS: 1 day   [name] [name] M   03/03/2015, 2:42 PM
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
## 2015-03-03 NOTE — Progress Notes (Signed)
MEDICATION RELATED CONSULT NOTE - INITIAL   Pharmacy Consult for Dilantin IV Indication: h/o seizure disorder  No Known Allergies  Patient Measurements: Height: 6' 1"  (185.4 cm) Weight: 245 lb 2.4 oz (111.2 kg) IBW/kg (Calculated) : 79.9  Vital Signs: Temp: 98.4 F (36.9 C) (03/26 0757) Temp Source: Oral (03/26 0757) BP: 118/44 mmHg (03/26 0757) Pulse Rate: 83 (03/26 0757) Intake/Output from previous day: 03/25 0701 - 03/26 0700 In: 524.2 [I.V.:524.2] Out: 2725 [Urine:1275; Stool:1450] Intake/Output from this shift: Total I/O In: 585 [I.V.:250; Blood:335] Out: -   Labs:  Recent Labs  03/02/15 2140 03/03/15 0020 03/03/15 0529  WBC 9.8 10.6* 10.6*  HGB 7.8* 7.2* 6.4*  HCT 24.3* 22.0* 20.2*  PLT 143* 134* 137*  CREATININE 2.42*  --  2.26*  ALBUMIN 3.1*  --  2.8*  PROT 5.9*  --  5.2*  AST 21  --  19  ALT 34  --  28  ALKPHOS 53  --  33*  BILITOT 0.4  --  0.4   Estimated Creatinine Clearance: 39.7 mL/min (by C-G formula based on Cr of 2.26).  Microbiology: Recent Results (from the past 720 hour(s))  MRSA PCR Screening     Status: Abnormal   Collection Time: 02/15/15  2:40 AM  Result Value Ref Range Status   MRSA by PCR POSITIVE (A) NEGATIVE Final    Comment:        The GeneXpert MRSA Assay (FDA approved for NASAL specimens only), is one component of a comprehensive MRSA colonization surveillance program. It is not intended to diagnose MRSA infection nor to guide or monitor treatment for MRSA infections. RESULT CALLED TO, READ BACK BY AND VERIFIED WITH: WOODS K AT 0414 ON [id number] BY FORSYTH K   MRSA PCR Screening     Status: Abnormal   Collection Time: 03/03/15 12:30 AM  Result Value Ref Range Status   MRSA by PCR POSITIVE (A) NEGATIVE Final    Comment:        The GeneXpert MRSA Assay (FDA approved for NASAL specimens only), is one component of a comprehensive MRSA colonization surveillance program. It is not intended to diagnose MRSA infection  nor to guide or monitor treatment for MRSA infections. RESULT CALLED TO, READ BACK BY AND VERIFIED WITH:  LEE,B @ 0308 ON 03/03/15 BY WOODIE,J    Medical History: Past Medical History  Diagnosis Date  . Chronic obstructive pulmonary disease     Chronic bronchitis;  home oxygen; multiple exacerbations  . Hypertension   . Cellulitis of lower leg   . Sleep apnea     Severe on a sleep study in 12/2010  . Hypothyroid     10/2002: TSH-0.43, T4-0.77  . Tobacco abuse, in remission     20 pack years; discontinued 1998  . Hyperlipidemia     No lipid profile available  . 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 Morbid obesity with BMI of 50.0-59.9, adult 10/28/2012  . Seizure disorder     Onset after craniotomy  . Aortic valve disease 10/2012    Prominent diastolic murmur; mild to moderate aortic insufficiency on echocardiogram in 10/2012  . Chronic kidney disease     S/P right nephrectomy for hypernephroma in 2010  . Seizures   . CHF (congestive heart failure)   . Diabetes mellitus without complication    Medications:  Prescriptions prior to admission  Medication Sig Dispense Refill Last Dose  .  albuterol (PROVENTIL) (2.5 MG/3ML) 0.083% nebulizer solution Take 3 mLs (2.5 mg total) by nebulization every 4 (four) hours as needed for wheezing or shortness of breath. 75 mL 12 Past Month at Unknown time  . aspirin EC 325 MG EC tablet Take 1 tablet (325 mg total) by mouth daily. 30 tablet 0 03/02/2015 at Unknown time  . budesonide-formoterol (SYMBICORT) 160-4.5 MCG/ACT inhaler Inhale 2 puffs into the lungs 2 (two) times daily.   03/02/2015 at Unknown time  . camphor-menthol (SARNA) lotion Apply topically as needed for itching. (Patient taking differently: Apply 1 application topically as needed for itching. ) 222 mL 0 unknown at Unknown time  . cloNIDine (CATAPRES) 0.2 MG tablet Take 1 tablet  (0.2 mg total) by mouth 3 (three) times daily. 90 tablet 3 03/02/2015 at Unknown time  . desmopressin (DDAVP) 0.2 MG tablet Take 1 tablet (0.2 mg total) by mouth 2 (two) times daily. 60 tablet 3 03/02/2015 at Unknown time  . dexamethasone (DECADRON) 0.5 MG tablet Take 0.5 mg by mouth daily.   03/02/2015 at Unknown time  . furosemide (LASIX) 20 MG tablet Take 20 mg by mouth daily.   03/02/2015 at Unknown time  . furosemide (LASIX) 80 MG tablet Take 80 mg by mouth daily.   03/02/2015 at Unknown time  . HYDROcodone-acetaminophen (NORCO/VICODIN) 5-325 MG per tablet Take two tablets by mouth every 6 hours as needed for pain. Max APAP 3gm/24hr from all sources (Patient taking differently: Take 2 tablets by mouth every 6 (six) hours as needed. Take two tablets by mouth every 6 hours as needed for pain. Max APAP 3gm/24hr from all sources) 240 tablet 0 unknown  . ipratropium (ATROVENT) 0.02 % nebulizer solution Take 0.5 mg by nebulization 4 (four) times daily.   Past Week at Unknown time  . levothyroxine (SYNTHROID, LEVOTHROID) 175 MCG tablet Take 175 mcg by mouth daily before breakfast.   03/02/2015 at Unknown time  . loratadine (CLARITIN) 10 MG tablet Take 10 mg by mouth daily.    Past Month at Unknown time  . metolazone (ZAROXOLYN) 5 MG tablet Take 5 mg by mouth daily.   03/02/2015 at Unknown time  . mometasone (NASONEX) 50 MCG/ACT nasal spray Place 2 sprays into the nose daily. 17 g 12 03/02/2015 at Unknown time  . omeprazole (PRILOSEC) 20 MG capsule Take 20 mg by mouth daily.   03/02/2015 at Unknown time  . phenytoin (DILANTIN) 100 MG ER capsule Take 200-300 mg by mouth daily. Takes 200 mg on Monday and Thursdsay. On all other days of the week takes 300 mg.   03/02/2015 at Unknown time  . polyethylene glycol powder (GLYCOLAX/MIRALAX) powder Take 17 g by mouth daily. May decrease to 17 g three times a week if needed. (Patient taking differently: Take 17 g by mouth daily. May decrease to 17 g three times a week if  needed.) 255 g 3 Past Week at Unknown time  . potassium chloride SA (K-DUR,KLOR-CON) 20 MEQ tablet Take 20 mEq by mouth 2 (two) times daily.   03/02/2015 at Unknown time  . predniSONE (DELTASONE) 10 MG tablet Take 10-40 mg by mouth See admin instructions. Starting on 02/21/2015, take 4 tablets daily for 3 days, then take 3 tablets daily for 3 days, then take 2 tablets daily for 3 days, then take 1 tablet daily for 3 days.   03/02/2015 at Unknown time  . simvastatin (ZOCOR) 80 MG tablet Take 80 mg by mouth daily.    03/02/2015 at Unknown time  .  sodium bicarbonate 650 MG tablet Take 1 tablet (650 mg total) by mouth 2 (two) times daily. (Patient taking differently: Take 650 mg by mouth daily. )   03/02/2015 at Unknown time  . torsemide (DEMADEX) 20 MG tablet Take 2 tablets (40 mg total) by mouth daily. 30 tablet 3 03/02/2015 at Unknown time  . hydrocortisone (PROCTOSOL HC) 2.5 % rectal cream Place 1 application rectally 2 (two) times daily. Do not use for longer than 7 days at a time. (Patient not taking: Reported on 03/02/2015) 30 g 0 unknown  . insulin aspart (NOVOLOG) 100 UNIT/ML injection Inject 0-9 Units into the skin 3 (three) times daily with meals. (Patient not taking: Reported on 01/24/2015) 10 mL 11 Not Taking  . ondansetron (ZOFRAN) 4 MG tablet Take 1 tablet (4 mg total) by mouth every 8 (eight) hours as needed for nausea or vomiting. (Patient not taking: Reported on 03/02/2015) 20 tablet 0 unknown  . peg 3350 powder (MOVIPREP) 100 G SOLR Take 1 kit (200 g total) by mouth as directed. 1 kit 0 unknown   Assessment: 69yo male w/ h/o COPD, CHF, CKD, and seizure disorder.  Admitted with GIB. No seizure reported on admission.  Pt on Dilantin PTA (home dose listed above).  Phenytoin level is low on admission as well as albumin.    Measured Dilantin level = 3.7 Albumin = 2.8  Corrected Dilantin level = 5.6  Goal of Therapy:  Corrected Dilantin level 10-20  Plan:  Dilantin 369m IV q8hrs Check  Dilantin level and Albumin at steady state Monitor for any seizure activity, adverse effects  [place] Crane Rachel Samples A 03/03/2015,8:33 AM
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
## 2015-03-04 ENCOUNTER — Encounter (HOSPITAL_COMMUNITY)
Admission: EM | Disposition: A | Discharge status: Home / Self Care | Payer: Self-pay | Source: Home / Self Care | Attending: Internal Medicine

## 2015-03-04 DIAGNOSIS — K648 Other hemorrhoids: Secondary | ICD-10-CM

## 2015-03-04 HISTORY — PX: COLONOSCOPY: SHX5424

## 2015-03-04 LAB — CBC WITH DIFFERENTIAL/PLATELET
BASOS ABS: 0 10*3/uL (ref 0.0–0.1)
BASOS PCT: 0 % (ref 0–1)
BASOS PCT: 1 % (ref 0–1)
Basophils Absolute: 0.1 10*3/uL (ref 0.0–0.1)
EOS PCT: 7 % — AB (ref 0–5)
EOS PCT: 9 % — AB (ref 0–5)
Eosinophils Absolute: 0.7 10*3/uL (ref 0.0–0.7)
Eosinophils Absolute: 0.9 10*3/uL — ABNORMAL HIGH (ref 0.0–0.7)
HCT: 24.1 % — ABNORMAL LOW (ref 39.0–52.0)
HCT: 22.8 % — ABNORMAL LOW (ref 39.0–52.0)
HEMOGLOBIN: 7.8 g/dL — AB (ref 13.0–17.0)
HEMOGLOBIN: 7.6 g/dL — AB (ref 13.0–17.0)
LYMPHS PCT: 10 % — AB (ref 12–46)
Lymphocytes Relative: 14 % (ref 12–46)
Lymphs Abs: 1.4 10*3/uL (ref 0.7–4.0)
Lymphs Abs: 1.1 10*3/uL (ref 0.7–4.0)
MCH: 30.2 pg (ref 26.0–34.0)
MCH: 30.5 pg (ref 26.0–34.0)
MCHC: 32.4 g/dL (ref 30.0–36.0)
MCHC: 33.3 g/dL (ref 30.0–36.0)
MCV: 93.4 fL (ref 78.0–100.0)
MCV: 91.6 fL (ref 78.0–100.0)
MONO ABS: 1 10*3/uL (ref 0.1–1.0)
MONOS PCT: 9 % (ref 3–12)
Monocytes Absolute: 0.9 10*3/uL (ref 0.1–1.0)
Monocytes Relative: 9 % (ref 3–12)
NEUTROS ABS: 6.8 10*3/uL (ref 1.7–7.7)
NEUTROS PCT: 69 % (ref 43–77)
Neutro Abs: 7.4 10*3/uL (ref 1.7–7.7)
Neutrophils Relative %: 71 % (ref 43–77)
Platelets: 107 10*3/uL — ABNORMAL LOW (ref 150–400)
Platelets: 103 10*3/uL — ABNORMAL LOW (ref 150–400)
RBC: 2.58 MIL/uL — AB (ref 4.22–5.81)
RBC: 2.49 MIL/uL — ABNORMAL LOW (ref 4.22–5.81)
RDW: 16 % — AB (ref 11.5–15.5)
RDW: 17.2 % — AB (ref 11.5–15.5)
WBC: 9.8 10*3/uL (ref 4.0–10.5)
WBC: 10.4 10*3/uL (ref 4.0–10.5)

## 2015-03-04 LAB — BASIC METABOLIC PANEL
ANION GAP: 8 (ref 5–15)
BUN: 84 mg/dL — ABNORMAL HIGH (ref 6–23)
CALCIUM: 7.8 mg/dL — AB (ref 8.4–10.5)
CHLORIDE: 117 mmol/L — AB (ref 96–112)
CO2: 19 mmol/L (ref 19–32)
Creatinine, Ser: 1.9 mg/dL — ABNORMAL HIGH (ref 0.50–1.35)
GFR calc Af Amer: 40 mL/min — ABNORMAL LOW (ref >90)
GFR calc non Af Amer: 34 mL/min — ABNORMAL LOW (ref >90)
GLUCOSE: 78 mg/dL (ref 70–99)
Potassium: 5 mmol/L (ref 3.5–5.1)
SODIUM: 144 mmol/L (ref 135–145)

## 2015-03-04 LAB — HEMOGLOBIN AND HEMATOCRIT, BLOOD
HEMATOCRIT: 24 % — AB (ref 39.0–52.0)
Hemoglobin: 8.1 g/dL — ABNORMAL LOW (ref 13.0–17.0)

## 2015-03-04 LAB — PREPARE RBC (CROSSMATCH)

## 2015-03-04 SURGERY — COLONOSCOPY
Anesthesia: Moderate Sedation

## 2015-03-04 MED ORDER — MIDAZOLAM HCL 5 MG/5ML IJ SOLN
INTRAMUSCULAR | Status: AC
Start: 2015-03-04 — End: 2015-03-04
  Filled 2015-03-04: qty 10

## 2015-03-04 MED ORDER — MEPERIDINE HCL 50 MG/ML IJ SOLN
INTRAMUSCULAR | Status: AC
  Filled 2015-03-04: qty 1

## 2015-03-04 MED ORDER — MIDAZOLAM HCL 5 MG/5ML IJ SOLN
INTRAMUSCULAR | Status: DC | PRN
  Administered 2015-03-04: 1 mg via INTRAVENOUS
  Administered 2015-03-04: 2 mg via INTRAVENOUS

## 2015-03-04 MED ORDER — PANTOPRAZOLE SODIUM 40 MG IV SOLR
INTRAVENOUS | Status: AC
  Filled 2015-03-04: qty 80

## 2015-03-04 MED ORDER — DIPHENHYDRAMINE HCL 25 MG PO CAPS
25.0000 mg | ORAL_CAPSULE | Freq: Once | ORAL | Status: AC
  Administered 2015-03-04: 25 mg via ORAL
  Filled 2015-03-04: qty 1

## 2015-03-04 MED ORDER — MEPERIDINE HCL 50 MG/ML IJ SOLN
INTRAMUSCULAR | Status: DC | PRN
  Administered 2015-03-04 (×2): 25 mg via INTRAVENOUS

## 2015-03-04 MED ORDER — SIMETHICONE 40 MG/0.6ML PO SUSP
ORAL | Status: AC
  Filled 2015-03-04: qty 1.8

## 2015-03-04 MED ORDER — SIMETHICONE 40 MG/0.6ML PO SUSP
ORAL | Status: DC | PRN
  Administered 2015-03-04: 08:00:00

## 2015-03-04 MED ORDER — ACETAMINOPHEN 325 MG PO TABS
650.0000 mg | ORAL_TABLET | Freq: Once | ORAL | Status: AC
  Administered 2015-03-04: 650 mg via ORAL
  Filled 2015-03-04: qty 2

## 2015-03-04 NOTE — Progress Notes (Addendum)
Subjective: This is a patient of Dr. Josephine Cables who came to the emergency department because of bleeding. He has a history of GI bleeding and had been scheduled for colonoscopy should been postponed because of acute illness. He had an episode of syncope. He says he has not had any further bleeding since admission he had upper GI yesterday which was negative for bleeding source and is set for colonoscopy today  Objective: Vital signs in last 24 hours: Temp:  [97.4 F (36.3 C)-99.2 F (37.3 C)] 97.9 F (36.6 C) (03/27 0400) Pulse Rate:  [37-97] 75 (03/27 0825) Resp:  [10-25] 15 (03/27 0825) BP: (112-152)/(32-62) 120/51 mmHg (03/27 0820) SpO2:  [99 %-100 %] 100 % (03/27 0825) Weight:  [115.8 kg (255 lb 4.7 oz)] 115.8 kg (255 lb 4.7 oz) (03/27 0446) Weight change: 11.473 kg (25 lb 4.7 oz) Last BM Date: 03/04/15  Intake/Output from previous day: 03/26 0701 - 03/27 0700 In: 2549 [I.V.:1100; Blood:1449] Out: 3925 [Urine:2975; Stool:950]  PHYSICAL EXAM General appearance: alert, cooperative and no distress Resp: rhonchi bilaterally Cardio: regular rate and rhythm, S1, S2 normal, no murmur, click, rub or gallop GI: soft, non-tender; bowel sounds normal; no masses,  no organomegaly Extremities: His edema is significantly better than it is normally  Lab Results:  Results for orders placed or performed during the hospital encounter of 03/02/15 (from the past 48 hour(s))  Urinalysis, Routine w reflex microscopic     Status: None   Collection Time: 03/02/15  9:11 PM  Result Value Ref Range   Color, Urine YELLOW YELLOW   APPearance CLEAR CLEAR   Specific Gravity, Urine 1.010 1.005 - 1.030   pH 5.5 5.0 - 8.0   Glucose, UA NEGATIVE NEGATIVE mg/dL   Hgb urine dipstick NEGATIVE NEGATIVE   Bilirubin Urine NEGATIVE NEGATIVE   Ketones, ur NEGATIVE NEGATIVE mg/dL   Protein, ur NEGATIVE NEGATIVE mg/dL   Urobilinogen, UA 0.2 0.0 - 1.0 mg/dL   Nitrite NEGATIVE NEGATIVE   Leukocytes, UA NEGATIVE  NEGATIVE    Comment: MICROSCOPIC NOT DONE ON URINES WITH NEGATIVE PROTEIN, BLOOD, LEUKOCYTES, NITRITE, OR GLUCOSE <1000 mg/dL.  POC occult blood, ED     Status: Abnormal   Collection Time: 03/02/15  9:17 PM  Result Value Ref Range   Fecal Occult Bld POSITIVE (A) NEGATIVE  CBC with Differential     Status: Abnormal   Collection Time: 03/02/15  9:40 PM  Result Value Ref Range   WBC 9.8 4.0 - 10.5 K/uL   RBC 2.63 (L) 4.22 - 5.81 MIL/uL   Hemoglobin 7.8 (L) 13.0 - 17.0 g/dL   HCT 24.3 (L) 39.0 - 52.0 %   MCV 92.4 78.0 - 100.0 fL   MCH 29.7 26.0 - 34.0 pg   MCHC 32.1 30.0 - 36.0 g/dL   RDW 19.5 (H) 11.5 - 15.5 %   Platelets 143 (L) 150 - 400 K/uL   Neutrophils Relative % 79 (H) 43 - 77 %   Neutro Abs 7.7 1.7 - 7.7 K/uL   Lymphocytes Relative 10 (L) 12 - 46 %   Lymphs Abs 1.0 0.7 - 4.0 K/uL   Monocytes Relative 9 3 - 12 %   Monocytes Absolute 0.9 0.1 - 1.0 K/uL   Eosinophils Relative 2 0 - 5 %   Eosinophils Absolute 0.2 0.0 - 0.7 K/uL   Basophils Relative 0 0 - 1 %   Basophils Absolute 0.0 0.0 - 0.1 K/uL  Comprehensive metabolic panel     Status: Abnormal  Collection Time: 03/02/15  9:40 PM  Result Value Ref Range   Sodium 140 135 - 145 mmol/L   Potassium 3.8 3.5 - 5.1 mmol/L   Chloride 107 96 - 112 mmol/L   CO2 24 19 - 32 mmol/L   Glucose, Bld 126 (H) 70 - 99 mg/dL   BUN 108 (H) 6 - 23 mg/dL    Comment: RESULTS CONFIRMED BY MANUAL DILUTION   Creatinine, Ser 2.42 (H) 0.50 - 1.35 mg/dL   Calcium 7.9 (L) 8.4 - 10.5 mg/dL   Total Protein 5.9 (L) 6.0 - 8.3 g/dL   Albumin 3.1 (L) 3.5 - 5.2 g/dL   AST 21 0 - 37 U/L   ALT 34 0 - 53 U/L   Alkaline Phosphatase 53 39 - 117 U/L   Total Bilirubin 0.4 0.3 - 1.2 mg/dL   GFR calc non Af Amer 25 (L) >90 mL/min   GFR calc Af Amer 30 (L) >90 mL/min    Comment: (NOTE) The eGFR has been calculated using the CKD EPI equation. This calculation has not been validated in all clinical situations. eGFR's persistently <90 mL/min signify possible  Chronic Kidney Disease.    Anion gap 9 5 - 15  Protime-INR (if patient is taking Coumadin)     Status: Abnormal   Collection Time: 03/02/15  9:40 PM  Result Value Ref Range   Prothrombin Time 16.2 (H) 11.6 - 15.2 seconds   INR 1.29 0.00 - 1.49  Type and screen     Status: None   Collection Time: 03/02/15  9:40 PM  Result Value Ref Range   ABO/RH(D) B POS    Antibody Screen NEG    Sample Expiration 03/05/2015    Unit Number [id number]    Blood Component Type RED CELLS,LR    Unit division 00    Status of Unit ISSUED,FINAL    Transfusion Status OK TO TRANSFUSE    Crossmatch Result Compatible    Unit Number [id number]    Blood Component Type RED CELLS,LR    Unit division 00    Status of Unit ISSUED,FINAL    Transfusion Status OK TO TRANSFUSE    Crossmatch Result Compatible    Unit Number [id number]    Blood Component Type RED CELLS,LR    Unit division 00    Status of Unit ISSUED,FINAL    Transfusion Status OK TO TRANSFUSE    Crossmatch Result Compatible   ABO/Rh     Status: None   Collection Time: 03/02/15  9:40 PM  Result Value Ref Range   ABO/RH(D) B POS   POC CBG, ED     Status: Abnormal   Collection Time: 03/02/15  9:41 PM  Result Value Ref Range   Glucose-Capillary 128 (H) 70 - 99 mg/dL  CBC with Differential/Platelet     Status: Abnormal   Collection Time: 03/03/15 12:20 AM  Result Value Ref Range   WBC 10.6 (H) 4.0 - 10.5 K/uL   RBC 2.38 (L) 4.22 - 5.81 MIL/uL   Hemoglobin 7.2 (L) 13.0 - 17.0 g/dL   HCT 22.0 (L) 39.0 - 52.0 %   MCV 92.4 78.0 - 100.0 fL   MCH 30.3 26.0 - 34.0 pg   MCHC 32.7 30.0 - 36.0 g/dL   RDW 19.6 (H) 11.5 - 15.5 %   Platelets 134 (L) 150 - 400 K/uL   Neutrophils Relative % 73 43 - 77 %   Neutro Abs 7.8 (H) 1.7 - 7.7 K/uL   Lymphocytes Relative 14 12 -  46 %   Lymphs Abs 1.5 0.7 - 4.0 K/uL   Monocytes Relative 9 3 - 12 %   Monocytes Absolute 1.0 0.1 - 1.0 K/uL   Eosinophils Relative 3 0 - 5 %   Eosinophils Absolute 0.3 0.0 -  0.7 K/uL   Basophils Relative 0 0 - 1 %   Basophils Absolute 0.0 0.0 - 0.1 K/uL  MRSA PCR Screening     Status: Abnormal   Collection Time: 03/03/15 12:30 AM  Result Value Ref Range   MRSA by PCR POSITIVE (A) NEGATIVE    Comment:        The GeneXpert MRSA Assay (FDA approved for NASAL specimens only), is one component of a comprehensive MRSA colonization surveillance program. It is not intended to diagnose MRSA infection nor to guide or monitor treatment for MRSA infections. RESULT CALLED TO, READ BACK BY AND VERIFIED WITH:  LEE,B @ 0308 ON 03/03/15 BY WOODIE,J   Glucose, capillary     Status: None   Collection Time: 03/03/15 12:37 AM  Result Value Ref Range   Glucose-Capillary 87 70 - 99 mg/dL   Comment 1 Notify RN   Glucose, capillary     Status: None   Collection Time: 03/03/15  5:12 AM  Result Value Ref Range   Glucose-Capillary 96 70 - 99 mg/dL   Comment 1 Notify RN   Comprehensive metabolic panel     Status: Abnormal   Collection Time: 03/03/15  5:29 AM  Result Value Ref Range   Sodium 143 135 - 145 mmol/L   Potassium 4.3 3.5 - 5.1 mmol/L   Chloride 113 (H) 96 - 112 mmol/L   CO2 22 19 - 32 mmol/L   Glucose, Bld 92 70 - 99 mg/dL   BUN 115 (H) 6 - 23 mg/dL    Comment: RESULTS CONFIRMED BY MANUAL DILUTION   Creatinine, Ser 2.26 (H) 0.50 - 1.35 mg/dL   Calcium 7.7 (L) 8.4 - 10.5 mg/dL   Total Protein 5.2 (L) 6.0 - 8.3 g/dL   Albumin 2.8 (L) 3.5 - 5.2 g/dL   AST 19 0 - 37 U/L   ALT 28 0 - 53 U/L   Alkaline Phosphatase 33 (L) 39 - 117 U/L   Total Bilirubin 0.4 0.3 - 1.2 mg/dL   GFR calc non Af Amer 28 (L) >90 mL/min   GFR calc Af Amer 32 (L) >90 mL/min    Comment: (NOTE) The eGFR has been calculated using the CKD EPI equation. This calculation has not been validated in all clinical situations. eGFR's persistently <90 mL/min signify possible Chronic Kidney Disease.    Anion gap 8 5 - 15  CBC     Status: Abnormal   Collection Time: 03/03/15  5:29 AM  Result  Value Ref Range   WBC 10.6 (H) 4.0 - 10.5 K/uL   RBC 2.18 (L) 4.22 - 5.81 MIL/uL   Hemoglobin 6.4 (LL) 13.0 - 17.0 g/dL    Comment: RESULT REPEATED AND VERIFIED CRITICAL RESULT CALLED TO, READ BACK BY AND VERIFIED WITH: B.LEE AT 0277 ON 03/03/15 BY S.VANHOORNE    HCT 20.2 (L) 39.0 - 52.0 %   MCV 92.7 78.0 - 100.0 fL   MCH 29.4 26.0 - 34.0 pg   MCHC 31.7 30.0 - 36.0 g/dL   RDW 20.0 (H) 11.5 - 15.5 %   Platelets 137 (L) 150 - 400 K/uL  Phenytoin level, total     Status: Abnormal   Collection Time: 03/03/15  5:29 AM  Result Value Ref Range   Phenytoin Lvl 3.7 (L) 10.0 - 20.0 ug/mL  Prepare RBC     Status: None   Collection Time: 03/03/15  6:26 AM  Result Value Ref Range   Order Confirmation ORDER PROCESSED BY BLOOD BANK   KOH prep     Status: None   Collection Time: 03/03/15 10:00 AM  Result Value Ref Range   Specimen Description BRONCH BRUSH TIP    Special Requests NONE    KOH Prep      NO YEAST OR FUNGAL ELEMENTS SEEN Performed at [facility]    Report Status 03/03/2015 FINAL   Glucose, capillary     Status: Abnormal   Collection Time: 03/03/15 12:14 PM  Result Value Ref Range   Glucose-Capillary 141 (H) 70 - 99 mg/dL  CBC with Differential/Platelet     Status: Abnormal   Collection Time: 03/03/15  2:06 PM  Result Value Ref Range   WBC 11.2 (H) 4.0 - 10.5 K/uL   RBC 2.42 (L) 4.22 - 5.81 MIL/uL   Hemoglobin 7.5 (L) 13.0 - 17.0 g/dL   HCT 22.6 (L) 39.0 - 52.0 %   MCV 93.4 78.0 - 100.0 fL   MCH 31.0 26.0 - 34.0 pg   MCHC 33.2 30.0 - 36.0 g/dL   RDW 17.4 (H) 11.5 - 15.5 %   Platelets 98 (L) 150 - 400 K/uL   Neutrophils Relative % 83 (H) 43 - 77 %   Neutro Abs 9.3 (H) 1.7 - 7.7 K/uL   Lymphocytes Relative 8 (L) 12 - 46 %   Lymphs Abs 0.9 0.7 - 4.0 K/uL   Monocytes Relative 6 3 - 12 %   Monocytes Absolute 0.7 0.1 - 1.0 K/uL   Eosinophils Relative 3 0 - 5 %   Eosinophils Absolute 0.3 0.0 - 0.7 K/uL   Basophils Relative 1 0 - 1 %   Basophils Absolute 0.1 0.0 -  0.1 K/uL   RBC Morphology POLYCHROMASIA PRESENT    Smear Review LARGE PLATELETS PRESENT     Comment: PLATELETS APPEAR DECREASED PLATELET COUNT CONFIRMED BY SMEAR   Prepare RBC     Status: None   Collection Time: 03/03/15  3:45 PM  Result Value Ref Range   Order Confirmation ORDER PROCESSED BY BLOOD BANK   CBC with Differential/Platelet     Status: Abnormal   Collection Time: 03/03/15 11:25 PM  Result Value Ref Range   WBC 9.8 4.0 - 10.5 K/uL   RBC 2.58 (L) 4.22 - 5.81 MIL/uL   Hemoglobin 7.8 (L) 13.0 - 17.0 g/dL    Comment: POST TRANSFUSION SPECIMEN   HCT 24.1 (L) 39.0 - 52.0 %   MCV 93.4 78.0 - 100.0 fL   MCH 30.2 26.0 - 34.0 pg   MCHC 32.4 30.0 - 36.0 g/dL   RDW 16.0 (H) 11.5 - 15.5 %   Platelets 107 (L) 150 - 400 K/uL    Comment: SPECIMEN CHECKED FOR CLOTS   Neutrophils Relative % 69 43 - 77 %   Neutro Abs 6.8 1.7 - 7.7 K/uL   Lymphocytes Relative 14 12 - 46 %   Lymphs Abs 1.4 0.7 - 4.0 K/uL   Monocytes Relative 9 3 - 12 %   Monocytes Absolute 0.9 0.1 - 1.0 K/uL   Eosinophils Relative 7 (H) 0 - 5 %   Eosinophils Absolute 0.7 0.0 - 0.7 K/uL   Basophils Relative 0 0 - 1 %   Basophils Absolute 0.0 0.0 - 0.1  K/uL    ABGS No results for input(s): PHART, PO2ART, TCO2, HCO3 in the last 72 hours.  Invalid input(s): PCO2 CULTURES Recent Results (from the past 240 hour(s))  MRSA PCR Screening     Status: Abnormal   Collection Time: 03/03/15 12:30 AM  Result Value Ref Range Status   MRSA by PCR POSITIVE (A) NEGATIVE Final    Comment:        The GeneXpert MRSA Assay (FDA approved for NASAL specimens only), is one component of a comprehensive MRSA colonization surveillance program. It is not intended to diagnose MRSA infection nor to guide or monitor treatment for MRSA infections. RESULT CALLED TO, READ BACK BY AND VERIFIED WITH:  LEE,B @ 0308 ON 03/03/15 BY WOODIE,J   KOH prep     Status: None   Collection Time: 03/03/15 10:00 AM  Result Value Ref Range Status    Specimen Description BRONCH BRUSH TIP  Final   Special Requests NONE  Final   KOH Prep   Final    NO YEAST OR FUNGAL ELEMENTS SEEN Performed at [facility]    Report Status 03/03/2015 FINAL  Final   Studies/Results: Dg Chest 1 View  03/02/2015   CLINICAL DATA:  Syncope. Generalized abdominal pain, nausea, black loose stools for 2 days. History of GI bleed.  EXAM: CHEST  1 VIEW  COMPARISON:  02/14/2015  FINDINGS: Cardiomegaly. Lungs are clear. No effusions. No acute bony abnormality. No edema.  IMPRESSION: Cardiomegaly.  No active disease.   Electronically Signed   By: Rolm Baptise M.D.   On: 03/02/2015 22:20    Medications:  Prior to Admission:  Prescriptions prior to admission  Medication Sig Dispense Refill Last Dose  . albuterol (PROVENTIL) (2.5 MG/3ML) 0.083% nebulizer solution Take 3 mLs (2.5 mg total) by nebulization every 4 (four) hours as needed for wheezing or shortness of breath. 75 mL 12 Past Month at Unknown time  . aspirin EC 325 MG EC tablet Take 1 tablet (325 mg total) by mouth daily. 30 tablet 0 03/02/2015 at Unknown time  . budesonide-formoterol (SYMBICORT) 160-4.5 MCG/ACT inhaler Inhale 2 puffs into the lungs 2 (two) times daily.   03/02/2015 at Unknown time  . camphor-menthol (SARNA) lotion Apply topically as needed for itching. (Patient taking differently: Apply 1 application topically as needed for itching. ) 222 mL 0 unknown at Unknown time  . cloNIDine (CATAPRES) 0.2 MG tablet Take 1 tablet (0.2 mg total) by mouth 3 (three) times daily. 90 tablet 3 03/02/2015 at Unknown time  . desmopressin (DDAVP) 0.2 MG tablet Take 1 tablet (0.2 mg total) by mouth 2 (two) times daily. 60 tablet 3 03/02/2015 at Unknown time  . dexamethasone (DECADRON) 0.5 MG tablet Take 0.5 mg by mouth daily.   03/02/2015 at Unknown time  . furosemide (LASIX) 20 MG tablet Take 20 mg by mouth daily.   03/02/2015 at Unknown time  . furosemide (LASIX) 80 MG tablet Take 80 mg by mouth daily.   03/02/2015  at Unknown time  . HYDROcodone-acetaminophen (NORCO/VICODIN) 5-325 MG per tablet Take two tablets by mouth every 6 hours as needed for pain. Max APAP 3gm/24hr from all sources (Patient taking differently: Take 2 tablets by mouth every 6 (six) hours as needed. Take two tablets by mouth every 6 hours as needed for pain. Max APAP 3gm/24hr from all sources) 240 tablet 0 unknown  . ipratropium (ATROVENT) 0.02 % nebulizer solution Take 0.5 mg by nebulization 4 (four) times daily.   Past Week  at Unknown time  . levothyroxine (SYNTHROID, LEVOTHROID) 175 MCG tablet Take 175 mcg by mouth daily before breakfast.   03/02/2015 at Unknown time  . loratadine (CLARITIN) 10 MG tablet Take 10 mg by mouth daily.    Past Month at Unknown time  . metolazone (ZAROXOLYN) 5 MG tablet Take 5 mg by mouth daily.   03/02/2015 at Unknown time  . mometasone (NASONEX) 50 MCG/ACT nasal spray Place 2 sprays into the nose daily. 17 g 12 03/02/2015 at Unknown time  . omeprazole (PRILOSEC) 20 MG capsule Take 20 mg by mouth daily.   03/02/2015 at Unknown time  . phenytoin (DILANTIN) 100 MG ER capsule Take 200-300 mg by mouth daily. Takes 200 mg on Monday and Thursdsay. On all other days of the week takes 300 mg.   03/02/2015 at Unknown time  . polyethylene glycol powder (GLYCOLAX/MIRALAX) powder Take 17 g by mouth daily. May decrease to 17 g three times a week if needed. (Patient taking differently: Take 17 g by mouth daily. May decrease to 17 g three times a week if needed.) 255 g 3 Past Week at Unknown time  . potassium chloride SA (K-DUR,KLOR-CON) 20 MEQ tablet Take 20 mEq by mouth 2 (two) times daily.   03/02/2015 at Unknown time  . predniSONE (DELTASONE) 10 MG tablet Take 10-40 mg by mouth See admin instructions. Starting on 02/21/2015, take 4 tablets daily for 3 days, then take 3 tablets daily for 3 days, then take 2 tablets daily for 3 days, then take 1 tablet daily for 3 days.   03/02/2015 at Unknown time  . simvastatin (ZOCOR) 80 MG tablet  Take 80 mg by mouth daily.    03/02/2015 at Unknown time  . sodium bicarbonate 650 MG tablet Take 1 tablet (650 mg total) by mouth 2 (two) times daily. (Patient taking differently: Take 650 mg by mouth daily. )   03/02/2015 at Unknown time  . torsemide (DEMADEX) 20 MG tablet Take 2 tablets (40 mg total) by mouth daily. 30 tablet 3 03/02/2015 at Unknown time  . hydrocortisone (PROCTOSOL HC) 2.5 % rectal cream Place 1 application rectally 2 (two) times daily. Do not use for longer than 7 days at a time. (Patient not taking: Reported on 03/02/2015) 30 g 0 unknown  . insulin aspart (NOVOLOG) 100 UNIT/ML injection Inject 0-9 Units into the skin 3 (three) times daily with meals. (Patient not taking: Reported on 01/24/2015) 10 mL 11 Not Taking  . ondansetron (ZOFRAN) 4 MG tablet Take 1 tablet (4 mg total) by mouth every 8 (eight) hours as needed for nausea or vomiting. (Patient not taking: Reported on 03/02/2015) 20 tablet 0 unknown  . peg 3350 powder (MOVIPREP) 100 G SOLR Take 1 kit (200 g total) by mouth as directed. 1 kit 0 unknown   Scheduled: . sodium chloride   Intravenous Once  . atorvastatin  40 mg Oral q1800  . budesonide-formoterol  2 puff Inhalation BID  . Chlorhexidine Gluconate Cloth  6 each Topical Q0600  . cloNIDine  0.2 mg Oral TID  . desmopressin  0.2 mg Oral BID  . fluticasone  2 spray Each Nare Daily  . ipratropium  0.5 mg Nebulization QID  . levothyroxine  87.5 mcg Intravenous Daily  . loratadine  10 mg Oral Daily  . meperidine      . methylPREDNISolone (SOLU-MEDROL) injection  20 mg Intravenous Daily  . midazolam      . mupirocin ointment  1 application Nasal BID  .  phenytoin (DILANTIN) IV  100 mg Intravenous Q8H  . simethicone       Continuous: . pantoprozole (PROTONIX) infusion 8 mg/hr (03/04/15 0700)   IZT:[id number] **OR** acetaminophen, albuterol, meperidine, midazolam, ondansetron **OR** ondansetron (ZOFRAN) IV, simethicone susp in sterile water 1000 mL  irrigation  Assesment: [name] admitted with GI bleeding. He has acute blood loss anemia. 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 He has multiple endocrine abnormalities from that of course. He has sleep apnea and has been trying to use his CPAP  At home but has only been able to tolerate it for 1-2 hours Principal Problem:   Upper GI bleed Active Problems:   Hypertension   Hypothyroid   Panhypopituitarism   Acute blood loss anemia   GI bleed   Bleeding gastrointestinal    Plan: Colonoscopy today. His lab work from this morning is still pending    LOS: 2 days   Mikala Podoll L 03/04/2015, 8:42 AM

## 2015-03-04 NOTE — Op Note (Signed)
COLONOSCOPY PROCEDURE REPORT  PATIENT:  [name] [name]  MR#:  [id number] Birthdate:  [birth year], 69 y.o., male Endoscopist:  Dr. Rogene Houston, MD Referred By:  Dr. [name], MD Procedure Date: 03/04/2015  Procedure:   Colonoscopy  Indications:  Patient is 69 year old African-American male with multiple medical problems presents with GI bleed anemia. Patient is on full dose aspirin also takes Alka-Seltzer on a frequent basis. He underwent EGD yesterday and no bleeding lesion was found. [name] therefore returning for diagnostic colonoscopy.  Informed Consent:  The procedure and risks were reviewed with the patient and informed consent was obtained.  Medications:  Demerol 50 mg IV Versed 3 mg IV  Description of procedure:  After a digital rectal exam was performed, that colonoscope was advanced from the anus through the rectum and colon to the area of the cecum, ileocecal valve and appendiceal orifice. The cecum was deeply intubated. These structures were well-seen and photographed for the record. From the level of the cecum and ileocecal valve, the scope was slowly and cautiously withdrawn. The mucosal surfaces were carefully surveyed utilizing scope tip to flexion to facilitate fold flattening as needed. The scope was pulled down into the rectum where a thorough exam including retroflexion was performed.  Findings:   Prep satisfactory. Redundant colon with pooling dark burgundy blood throughout but no bleeding lesion identified. Normal rectal mucosa. Small hemorrhoids above and below the dentate line.   Therapeutic/Diagnostic Maneuvers Performed: None  Complications:  None  Cecal Withdrawal Time:  16 minutes  Impression:  Examination performed to cecum. Pooling of dark burgundy blood noted throughout the colon but no bleeding lesion identified. Small internal and external hemorrhoids.  Comment; Suspect small bowel to be the source of GI bleed.  Recommendations:  Small  bowel given capsule study in a.m. Clear liquids today.  REHMAN,NAJEEB U  03/04/2015 8:43 AM  CC: Dr. [name], MD & Dr. Rayne Du ref. provider found

## 2015-03-05 ENCOUNTER — Encounter (HOSPITAL_COMMUNITY): Payer: Medicare HMO

## 2015-03-05 ENCOUNTER — Encounter (HOSPITAL_COMMUNITY)
Admission: EM | Disposition: A | Discharge status: Home / Self Care | Payer: Self-pay | Source: Home / Self Care | Attending: Internal Medicine

## 2015-03-05 ENCOUNTER — Encounter (HOSPITAL_COMMUNITY): Payer: Self-pay | Admitting: Internal Medicine

## 2015-03-05 HISTORY — PX: GIVENS CAPSULE STUDY: SHX5432

## 2015-03-05 LAB — CBC WITH DIFFERENTIAL/PLATELET
BASOS PCT: 1 % (ref 0–1)
Basophils Absolute: 0.1 10*3/uL (ref 0.0–0.1)
EOS ABS: 0.8 10*3/uL — AB (ref 0.0–0.7)
Eosinophils Relative: 9 % — ABNORMAL HIGH (ref 0–5)
HCT: 21.5 % — ABNORMAL LOW (ref 39.0–52.0)
Hemoglobin: 7.3 g/dL — ABNORMAL LOW (ref 13.0–17.0)
Lymphocytes Relative: 10 % — ABNORMAL LOW (ref 12–46)
Lymphs Abs: 0.9 10*3/uL (ref 0.7–4.0)
MCH: 32 pg (ref 26.0–34.0)
MCHC: 34 g/dL (ref 30.0–36.0)
MCV: 94.3 fL (ref 78.0–100.0)
MONOS PCT: 8 % (ref 3–12)
Monocytes Absolute: 0.7 10*3/uL (ref 0.1–1.0)
NEUTROS PCT: 72 % (ref 43–77)
Neutro Abs: 6.7 10*3/uL (ref 1.7–7.7)
PLATELETS: 89 10*3/uL — AB (ref 150–400)
RBC: 2.28 MIL/uL — AB (ref 4.22–5.81)
RDW: 17.1 % — ABNORMAL HIGH (ref 11.5–15.5)
Smear Review: DECREASED
WBC: 9.2 10*3/uL (ref 4.0–10.5)

## 2015-03-05 LAB — GLUCOSE, CAPILLARY
GLUCOSE-CAPILLARY: 128 mg/dL — AB (ref 70–99)
GLUCOSE-CAPILLARY: 81 mg/dL (ref 70–99)
GLUCOSE-CAPILLARY: 85 mg/dL (ref 70–99)
Glucose-Capillary: 114 mg/dL — ABNORMAL HIGH (ref 70–99)
Glucose-Capillary: 75 mg/dL (ref 70–99)
Glucose-Capillary: 70 mg/dL (ref 70–99)
Glucose-Capillary: 115 mg/dL — ABNORMAL HIGH (ref 70–99)
Glucose-Capillary: 93 mg/dL (ref 70–99)

## 2015-03-05 LAB — COMPREHENSIVE METABOLIC PANEL
ALT: 21 U/L (ref 0–53)
AST: 15 U/L (ref 0–37)
Albumin: 2.6 g/dL — ABNORMAL LOW (ref 3.5–5.2)
Alkaline Phosphatase: 26 U/L — ABNORMAL LOW (ref 39–117)
Anion gap: 5 (ref 5–15)
BUN: 62 mg/dL — ABNORMAL HIGH (ref 6–23)
CALCIUM: 7.8 mg/dL — AB (ref 8.4–10.5)
CHLORIDE: 112 mmol/L (ref 96–112)
CO2: 24 mmol/L (ref 19–32)
Creatinine, Ser: 1.64 mg/dL — ABNORMAL HIGH (ref 0.50–1.35)
GFR calc Af Amer: 47 mL/min — ABNORMAL LOW (ref >90)
GFR, EST NON AFRICAN AMERICAN: 41 mL/min — AB (ref >90)
GLUCOSE: 88 mg/dL (ref 70–99)
Potassium: 4.1 mmol/L (ref 3.5–5.1)
Sodium: 141 mmol/L (ref 135–145)
TOTAL PROTEIN: 4.8 g/dL — AB (ref 6.0–8.3)
Total Bilirubin: 0.4 mg/dL (ref 0.3–1.2)

## 2015-03-05 LAB — PHENYTOIN LEVEL, TOTAL: PHENYTOIN LVL: 5.1 ug/mL — AB (ref 10.0–20.0)

## 2015-03-05 LAB — PREPARE RBC (CROSSMATCH)

## 2015-03-05 LAB — H. PYLORI ANTIBODY, IGG

## 2015-03-05 SURGERY — IMAGING PROCEDURE, GI TRACT, INTRALUMINAL, VIA CAPSULE

## 2015-03-05 MED ORDER — SODIUM CHLORIDE 0.9 % IJ SOLN
10.0000 mL | Freq: Two times a day (BID) | INTRAMUSCULAR | Status: DC
  Administered 2015-03-05 – 2015-03-06 (×2): 10 mL
  Administered 2015-03-06: 20 mL
  Administered 2015-03-07 – 2015-03-08 (×3): 10 mL
  Administered 2015-03-09: 20 mL
  Administered 2015-03-09: 10 mL

## 2015-03-05 MED ORDER — SODIUM CHLORIDE 0.9 % IJ SOLN
10.0000 mL | INTRAMUSCULAR | Status: DC | PRN
  Administered 2015-03-05: 20 mL
  Filled 2015-03-05: qty 40

## 2015-03-05 MED ORDER — SODIUM CHLORIDE 0.9 % IV SOLN
Freq: Once | INTRAVENOUS | Status: AC
  Administered 2015-03-05: 15:00:00 via INTRAVENOUS

## 2015-03-05 NOTE — Progress Notes (Signed)
MEDICATION RELATED CONSULT NOTE -   Pharmacy Consult for Dilantin IV Indication: h/o seizure disorder  No Known Allergies  Patient Measurements: Height: _0  (185.4 cm) Weight: 261 lb (118.389 kg) IBW/kg (Calculated) : 79.9  Vital Signs: Temp: 98.4 F (36.9 C) (03/28 0800) Temp Source: Oral (03/28 0800) BP: 134/41 mmHg (03/28 0600) Pulse Rate: 72 (03/28 0630) Intake/Output from previous day: 03/27 0701 - 03/28 0700 In: 885 [I.V.:550; Blood:335] Out: 2600 [Urine:2600] Intake/Output from this shift: Total I/O In: -  Out: 250 [Urine:250]  Labs:  Recent Labs  03/02/15 2140  03/03/15 0529  03/03/15 2325 03/04/15 0927 03/04/15 1634 03/05/15 0543  WBC 9.8  < > 10.6*  < > 9.8 10.4  --  9.2  HGB 7.8*  < > 6.4*  < > 7.8* 7.6* 8.1* 7.3*  HCT 24.3*  < > 20.2*  < > 24.1* 22.8* 24.0* 21.5*  PLT 143*  < > 137*  < > 107* 103*  --  89*  CREATININE 2.42*  --  2.26*  --   --  1.90*  --  1.64*  ALBUMIN 3.1*  --  2.8*  --   --   --   --  2.6*  PROT 5.9*  --  5.2*  --   --   --   --  4.8*  AST 21  --  19  --   --   --   --  15  ALT 34  --  28  --   --   --   --  21  ALKPHOS 53  --  33*  --   --   --   --  26*  BILITOT 0.4  --  0.4  --   --   --   --  0.4  < > = values in this interval not displayed. Estimated Creatinine Clearance: 56.5 mL/min (by C-G formula based on Cr of 1.64).  Microbiology: Recent Results (from the past 720 hour(s))  MRSA PCR Screening     Status: Abnormal   Collection Time: 02/15/15  2:40 AM  Result Value Ref Range Status   MRSA by PCR POSITIVE (A) NEGATIVE Final    Comment:        The GeneXpert MRSA Assay (FDA approved for NASAL specimens only), is one component of a comprehensive MRSA colonization surveillance program. It is not intended to diagnose MRSA infection nor to guide or monitor treatment for MRSA infections. RESULT CALLED TO, READ BACK BY AND VERIFIED WITH: WOODS K AT 0414 ON [id number] BY FORSYTH K   MRSA PCR Screening     Status:  Abnormal   Collection Time: 03/03/15 12:30 AM  Result Value Ref Range Status   MRSA by PCR POSITIVE (A) NEGATIVE Final    Comment:        The GeneXpert MRSA Assay (FDA approved for NASAL specimens only), is one component of a comprehensive MRSA colonization surveillance program. It is not intended to diagnose MRSA infection nor to guide or monitor treatment for MRSA infections. RESULT CALLED TO, READ BACK BY AND VERIFIED WITH:  LEE,B @ 0308 ON 03/03/15 BY WOODIE,J   KOH prep     Status: None   Collection Time: 03/03/15 10:00 AM  Result Value Ref Range Status   Specimen Description BRONCH BRUSH TIP  Final   Special Requests NONE  Final   KOH Prep   Final    NO YEAST OR FUNGAL ELEMENTS SEEN Performed at [facility]  Report Status 03/03/2015 FINAL  Final   Medical History: Past Medical History  Diagnosis Date  . Chronic obstructive pulmonary disease     Chronic bronchitis;  home oxygen; multiple exacerbations  . Hypertension   . Cellulitis of lower leg   . Sleep apnea     Severe on a sleep study in 12/2010  . Hypothyroid     10/2002: TSH-0.43, T4-0.77  . Tobacco abuse, in remission     20 pack years; discontinued 1998  . Hyperlipidemia     No lipid profile available  . 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 Morbid obesity with BMI of 50.0-59.9, adult 10/28/2012  . Seizure disorder     Onset after craniotomy  . Aortic valve disease 10/2012    Prominent diastolic murmur; mild to moderate aortic insufficiency on echocardiogram in 10/2012  . Chronic kidney disease     S/P right nephrectomy for hypernephroma in 2010  . Seizures   . CHF (congestive heart failure)   . Diabetes mellitus without complication    Medications:  Prescriptions prior to admission  Medication Sig Dispense Refill Last Dose  . albuterol (PROVENTIL) (2.5 MG/3ML) 0.083% nebulizer  solution Take 3 mLs (2.5 mg total) by nebulization every 4 (four) hours as needed for wheezing or shortness of breath. 75 mL 12 Past Month at Unknown time  . aspirin EC 325 MG EC tablet Take 1 tablet (325 mg total) by mouth daily. 30 tablet 0 03/02/2015 at Unknown time  . budesonide-formoterol (SYMBICORT) 160-4.5 MCG/ACT inhaler Inhale 2 puffs into the lungs 2 (two) times daily.   03/02/2015 at Unknown time  . camphor-menthol (SARNA) lotion Apply topically as needed for itching. (Patient taking differently: Apply 1 application topically as needed for itching. ) 222 mL 0 unknown at Unknown time  . cloNIDine (CATAPRES) 0.2 MG tablet Take 1 tablet (0.2 mg total) by mouth 3 (three) times daily. 90 tablet 3 03/02/2015 at Unknown time  . desmopressin (DDAVP) 0.2 MG tablet Take 1 tablet (0.2 mg total) by mouth 2 (two) times daily. 60 tablet 3 03/02/2015 at Unknown time  . dexamethasone (DECADRON) 0.5 MG tablet Take 0.5 mg by mouth daily.   03/02/2015 at Unknown time  . furosemide (LASIX) 20 MG tablet Take 20 mg by mouth daily.   03/02/2015 at Unknown time  . furosemide (LASIX) 80 MG tablet Take 80 mg by mouth daily.   03/02/2015 at Unknown time  . HYDROcodone-acetaminophen (NORCO/VICODIN) 5-325 MG per tablet Take two tablets by mouth every 6 hours as needed for pain. Max APAP 3gm/24hr from all sources (Patient taking differently: Take 2 tablets by mouth every 6 (six) hours as needed. Take two tablets by mouth every 6 hours as needed for pain. Max APAP 3gm/24hr from all sources) 240 tablet 0 unknown  . ipratropium (ATROVENT) 0.02 % nebulizer solution Take 0.5 mg by nebulization 4 (four) times daily.   Past Week at Unknown time  . levothyroxine (SYNTHROID, LEVOTHROID) 175 MCG tablet Take 175 mcg by mouth daily before breakfast.   03/02/2015 at Unknown time  . loratadine (CLARITIN) 10 MG tablet Take 10 mg by mouth daily.    Past Month at Unknown time  . metolazone (ZAROXOLYN) 5 MG tablet Take 5 mg by mouth daily.    03/02/2015 at Unknown time  . mometasone (NASONEX) 50 MCG/ACT nasal spray Place 2 sprays into the nose daily. 17 g 12 03/02/2015  at Unknown time  . omeprazole (PRILOSEC) 20 MG capsule Take 20 mg by mouth daily.   03/02/2015 at Unknown time  . phenytoin (DILANTIN) 100 MG ER capsule Take 200-300 mg by mouth daily. Takes 200 mg on Monday and Thursdsay. On all other days of the week takes 300 mg.   03/02/2015 at Unknown time  . polyethylene glycol powder (GLYCOLAX/MIRALAX) powder Take 17 g by mouth daily. May decrease to 17 g three times a week if needed. (Patient taking differently: Take 17 g by mouth daily. May decrease to 17 g three times a week if needed.) 255 g 3 Past Week at Unknown time  . potassium chloride SA (K-DUR,KLOR-CON) 20 MEQ tablet Take 20 mEq by mouth 2 (two) times daily.   03/02/2015 at Unknown time  . predniSONE (DELTASONE) 10 MG tablet Take 10-40 mg by mouth See admin instructions. Starting on 02/21/2015, take 4 tablets daily for 3 days, then take 3 tablets daily for 3 days, then take 2 tablets daily for 3 days, then take 1 tablet daily for 3 days.   03/02/2015 at Unknown time  . simvastatin (ZOCOR) 80 MG tablet Take 80 mg by mouth daily.    03/02/2015 at Unknown time  . sodium bicarbonate 650 MG tablet Take 1 tablet (650 mg total) by mouth 2 (two) times daily. (Patient taking differently: Take 650 mg by mouth daily. )   03/02/2015 at Unknown time  . torsemide (DEMADEX) 20 MG tablet Take 2 tablets (40 mg total) by mouth daily. 30 tablet 3 03/02/2015 at Unknown time  . hydrocortisone (PROCTOSOL HC) 2.5 % rectal cream Place 1 application rectally 2 (two) times daily. Do not use for longer than 7 days at a time. (Patient not taking: Reported on 03/02/2015) 30 g 0 unknown  . insulin aspart (NOVOLOG) 100 UNIT/ML injection Inject 0-9 Units into the skin 3 (three) times daily with meals. (Patient not taking: Reported on 01/24/2015) 10 mL 11 Not Taking  . ondansetron (ZOFRAN) 4 MG tablet Take 1 tablet (4 mg  total) by mouth every 8 (eight) hours as needed for nausea or vomiting. (Patient not taking: Reported on 03/02/2015) 20 tablet 0 unknown  . peg 3350 powder (MOVIPREP) 100 G SOLR Take 1 kit (200 g total) by mouth as directed. 1 kit 0 unknown   Assessment: 69yo male w/ h/o COPD, CHF, CKD, and seizure disorder.  Admitted with GIB. No seizures reported this admission.  Pt on Dilantin PTA (home dose listed above).  Pt is having capsule study per GI.  Phenytoin level and albumin are low but trending up toward desired goal.    Measured Dilantin level = 5.1 Albumin = 2.6 Corrected Dilantin level = 8.2  Goal of Therapy:  Corrected Dilantin level 10-20  Plan:  Continue Dilantin 365m IV q8hrs (slight increase from home dosing) Recommend recheck Dilantin level and Albumin at steady state Monitor for any seizure activity, adverse effects Switch to PO Dilantin when appropriate  HHart RobinsonsA 03/05/2015,10:38 AM

## 2015-03-05 NOTE — Progress Notes (Signed)
    Subjective: No overt GI bleeding. No abdominal pain, N/V. NPO for capsule study.   Objective: Vital signs in last 24 hours: Temp:  [97.2 F (36.2 C)-98.8 F (37.1 C)] 98.4 F (36.9 C) (03/28 0800) Pulse Rate:  [69-86] 72 (03/28 0630) Resp:  [9-21] 13 (03/28 0630) BP: (118-150)/(33-95) 134/41 mmHg (03/28 0600) SpO2:  [99 %-100 %] 100 % (03/28 0837) Weight:  [261 lb 3.9 oz (118.5 kg)] 261 lb 3.9 oz (118.5 kg) (03/28 0500) Last BM Date: 03/04/15 General:   Alert and oriented, pleasant Head:  Normocephalic and atraumatic. Eyes:  No icterus, sclera clear. Conjuctiva pink.  Mouth:  Without lesions, mucosa pink and moist.  Neck:  Supple, without thyromegaly or masses.  Heart:  S1, S2 present, no murmurs noted.  Lungs: Clear to auscultation bilaterally, without wheezing, rales, or rhonchi.  Abdomen:  Bowel sounds present, soft, non-tender, non-distended. No HSM or hernias noted. No rebound or guarding. No masses appreciated  Msk:  Symmetrical without gross deformities. Normal posture. Pulses:  Normal pulses noted. Extremities:  Without clubbing or edema. Neurologic:  Alert and  oriented x4;  grossly normal neurologically. Skin:  Warm and dry, intact without significant lesions.  Cervical Nodes:  No significant cervical adenopathy. Psych:  Alert and cooperative. Normal mood and affect.  Intake/Output from previous day: 03/27 0701 - 03/28 0700 In: 885 [I.V.:550; Blood:335] Out: 2600 [Urine:2600] Intake/Output this shift: Total I/O In: -  Out: 250 [Urine:250]  Lab Results:  Recent Labs  03/03/15 2325 03/04/15 0927 03/04/15 1634 03/05/15 0543  WBC 9.8 10.4  --  9.2  HGB 7.8* 7.6* 8.1* 7.3*  HCT 24.1* 22.8* 24.0* 21.5*  PLT 107* 103*  --  89*   BMET  Recent Labs  03/03/15 0529 03/04/15 0927 03/05/15 0543  NA 143 144 141  K 4.3 5.0 4.1  CL 113* 117* 112  CO2 22 19 24   GLUCOSE 92 78 88  BUN 115* 84* 62*  CREATININE 2.26* 1.90* 1.64*  CALCIUM 7.7* 7.8* 7.8*    LFT  Recent Labs  03/02/15 2140 03/03/15 0529 03/05/15 0543  PROT 5.9* 5.2* 4.8*  ALBUMIN 3.1* 2.8* 2.6*  AST 21 19 15   ALT 34 28 21  ALKPHOS 53 33* 26*  BILITOT 0.4 0.4 0.4   PT/INR  Recent Labs  03/02/15 2140  LABPROT 16.2*  INR 1.29    Assessment: 69 year old male admitted with melena and acute blood loss anemia (Hgb 6.4), history of hematochezia in the past, with EGD negative for candida esophagitis, noted pyloric channel and post bulbar duodenitis but without obvious bleeding lesions. Colonoscopy with pooling of dark burgundy blood noted throughout colon but without obvious bleeding lesion. Capsule study to be performed today and will be read on 3/29. Suspect small bowel source for bleed. No further overt GI bleeding, with Hgb drifting from yesterday back to the 7 range. 4 units PRBCs already received this admission, with an additional 2 units ordered this morning per attending. NPO now for capsule study.      Plan: Follow-up on pending H.pylori serology Transfuse as needed (2 additional units ordered today per attending) Capsule study today, will be read tomorrow Remains on Protonix drip; change to IV BID this evening Will continue to follow with you   Orvil Feil, ANP-BC [facility]     LOS: 3 days    03/05/2015, 8:58 AM

## 2015-03-05 NOTE — Care Management Utilization Note (Signed)
UR completed 

## 2015-03-05 NOTE — Progress Notes (Signed)
Subjective: Patient was admitted due to GI bleeding. He had colonoscopy and upper endoscopy. However, the site of bleeding is not identified. [name] still dropping his H/H. [name] scheduled for capsular study today.  Objective: Vital signs in last 24 hours: Temp:  [97.2 F (36.2 C)-98.8 F (37.1 C)] 98.4 F (36.9 C) (03/28 0400) Pulse Rate:  [69-86] 72 (03/28 0630) Resp:  [9-25] 13 (03/28 0630) BP: (112-150)/(33-95) 134/41 mmHg (03/28 0600) SpO2:  [99 %-100 %] 100 % (03/28 0630) Weight:  [118.5 kg (261 lb 3.9 oz)] 118.5 kg (261 lb 3.9 oz) (03/28 0500) Weight change: 2.7 kg (5 lb 15.2 oz) Last BM Date: 03/04/15  Intake/Output from previous day: 03/27 0701 - 03/28 0700 In: 885 [I.V.:550; Blood:335] Out: 2600 [Urine:2600]  PHYSICAL EXAM General appearance: alert and no distress Resp: diminished breath sounds bilaterally and rhonchi bilaterally Cardio: S1, S2 normal GI: soft, non-tender; bowel sounds normal; no masses,  no organomegaly Extremities: extremities normal, atraumatic, no cyanosis or edema  Lab Results:  Results for orders placed or performed during the hospital encounter of 03/02/15 (from the past 48 hour(s))  KOH prep     Status: None   Collection Time: 03/03/15 10:00 AM  Result Value Ref Range   Specimen Description BRONCH BRUSH TIP    Special Requests NONE    KOH Prep      NO YEAST OR FUNGAL ELEMENTS SEEN Performed at [facility]    Report Status 03/03/2015 FINAL   Glucose, capillary     Status: Abnormal   Collection Time: 03/03/15 12:14 PM  Result Value Ref Range   Glucose-Capillary 141 (H) 70 - 99 mg/dL  CBC with Differential/Platelet     Status: Abnormal   Collection Time: 03/03/15  2:06 PM  Result Value Ref Range   WBC 11.2 (H) 4.0 - 10.5 K/uL   RBC 2.42 (L) 4.22 - 5.81 MIL/uL   Hemoglobin 7.5 (L) 13.0 - 17.0 g/dL   HCT 22.6 (L) 39.0 - 52.0 %   MCV 93.4 78.0 - 100.0 fL   MCH 31.0 26.0 - 34.0 pg   MCHC 33.2 30.0 - 36.0 g/dL   RDW 17.4 (H)  11.5 - 15.5 %   Platelets 98 (L) 150 - 400 K/uL   Neutrophils Relative % 83 (H) 43 - 77 %   Neutro Abs 9.3 (H) 1.7 - 7.7 K/uL   Lymphocytes Relative 8 (L) 12 - 46 %   Lymphs Abs 0.9 0.7 - 4.0 K/uL   Monocytes Relative 6 3 - 12 %   Monocytes Absolute 0.7 0.1 - 1.0 K/uL   Eosinophils Relative 3 0 - 5 %   Eosinophils Absolute 0.3 0.0 - 0.7 K/uL   Basophils Relative 1 0 - 1 %   Basophils Absolute 0.1 0.0 - 0.1 K/uL   RBC Morphology POLYCHROMASIA PRESENT    Smear Review LARGE PLATELETS PRESENT     Comment: PLATELETS APPEAR DECREASED PLATELET COUNT CONFIRMED BY SMEAR   Prepare RBC     Status: None   Collection Time: 03/03/15  3:45 PM  Result Value Ref Range   Order Confirmation ORDER PROCESSED BY BLOOD BANK   Glucose, capillary     Status: Abnormal   Collection Time: 03/03/15  5:20 PM  Result Value Ref Range   Glucose-Capillary 114 (H) 70 - 99 mg/dL  CBC with Differential/Platelet     Status: Abnormal   Collection Time: 03/03/15 11:25 PM  Result Value Ref Range   WBC 9.8 4.0 -  10.5 K/uL   RBC 2.58 (L) 4.22 - 5.81 MIL/uL   Hemoglobin 7.8 (L) 13.0 - 17.0 g/dL    Comment: POST TRANSFUSION SPECIMEN   HCT 24.1 (L) 39.0 - 52.0 %   MCV 93.4 78.0 - 100.0 fL   MCH 30.2 26.0 - 34.0 pg   MCHC 32.4 30.0 - 36.0 g/dL   RDW 16.0 (H) 11.5 - 15.5 %   Platelets 107 (L) 150 - 400 K/uL    Comment: SPECIMEN CHECKED FOR CLOTS   Neutrophils Relative % 69 43 - 77 %   Neutro Abs 6.8 1.7 - 7.7 K/uL   Lymphocytes Relative 14 12 - 46 %   Lymphs Abs 1.4 0.7 - 4.0 K/uL   Monocytes Relative 9 3 - 12 %   Monocytes Absolute 0.9 0.1 - 1.0 K/uL   Eosinophils Relative 7 (H) 0 - 5 %   Eosinophils Absolute 0.7 0.0 - 0.7 K/uL   Basophils Relative 0 0 - 1 %   Basophils Absolute 0.0 0.0 - 0.1 K/uL  Glucose, capillary     Status: None   Collection Time: 03/04/15  2:02 AM  Result Value Ref Range   Glucose-Capillary 75 70 - 99 mg/dL  Glucose, capillary     Status: None   Collection Time: 03/04/15  6:05 AM   Result Value Ref Range   Glucose-Capillary 70 70 - 99 mg/dL  Basic metabolic panel     Status: Abnormal   Collection Time: 03/04/15  9:27 AM  Result Value Ref Range   Sodium 144 135 - 145 mmol/L   Potassium 5.0 3.5 - 5.1 mmol/L   Chloride 117 (H) 96 - 112 mmol/L   CO2 19 19 - 32 mmol/L   Glucose, Bld 78 70 - 99 mg/dL   BUN 84 (H) 6 - 23 mg/dL   Creatinine, Ser 1.90 (H) 0.50 - 1.35 mg/dL   Calcium 7.8 (L) 8.4 - 10.5 mg/dL   GFR calc non Af Amer 34 (L) >90 mL/min   GFR calc Af Amer 40 (L) >90 mL/min    Comment: (NOTE) The eGFR has been calculated using the CKD EPI equation. This calculation has not been validated in all clinical situations. eGFR's persistently <90 mL/min signify possible Chronic Kidney Disease.    Anion gap 8 5 - 15  CBC with Differential/Platelet     Status: Abnormal   Collection Time: 03/04/15  9:27 AM  Result Value Ref Range   WBC 10.4 4.0 - 10.5 K/uL   RBC 2.49 (L) 4.22 - 5.81 MIL/uL   Hemoglobin 7.6 (L) 13.0 - 17.0 g/dL   HCT 22.8 (L) 39.0 - 52.0 %   MCV 91.6 78.0 - 100.0 fL   MCH 30.5 26.0 - 34.0 pg   MCHC 33.3 30.0 - 36.0 g/dL   RDW 17.2 (H) 11.5 - 15.5 %   Platelets 103 (L) 150 - 400 K/uL    Comment: SPECIMEN CHECKED FOR CLOTS PLATELET COUNT CONFIRMED BY SMEAR    Neutrophils Relative % 71 43 - 77 %   Neutro Abs 7.4 1.7 - 7.7 K/uL   Lymphocytes Relative 10 (L) 12 - 46 %   Lymphs Abs 1.1 0.7 - 4.0 K/uL   Monocytes Relative 9 3 - 12 %   Monocytes Absolute 1.0 0.1 - 1.0 K/uL   Eosinophils Relative 9 (H) 0 - 5 %   Eosinophils Absolute 0.9 (H) 0.0 - 0.7 K/uL   Basophils Relative 1 0 - 1 %   Basophils Absolute 0.1  0.0 - 0.1 K/uL  Prepare RBC     Status: None   Collection Time: 03/04/15 10:00 AM  Result Value Ref Range   Order Confirmation ORDER PROCESSED BY BLOOD BANK   Glucose, capillary     Status: Abnormal   Collection Time: 03/04/15 10:54 AM  Result Value Ref Range   Glucose-Capillary 115 (H) 70 - 99 mg/dL  Hemoglobin and hematocrit, blood      Status: Abnormal   Collection Time: 03/04/15  4:34 PM  Result Value Ref Range   Hemoglobin 8.1 (L) 13.0 - 17.0 g/dL   HCT 24.0 (L) 39.0 - 52.0 %  Glucose, capillary     Status: Abnormal   Collection Time: 03/04/15  4:46 PM  Result Value Ref Range   Glucose-Capillary 128 (H) 70 - 99 mg/dL  Glucose, capillary     Status: None   Collection Time: 03/04/15 11:31 PM  Result Value Ref Range   Glucose-Capillary 81 70 - 99 mg/dL  Comprehensive metabolic panel     Status: Abnormal   Collection Time: 03/05/15  5:43 AM  Result Value Ref Range   Sodium 141 135 - 145 mmol/L   Potassium 4.1 3.5 - 5.1 mmol/L    Comment: DELTA CHECK NOTED   Chloride 112 96 - 112 mmol/L   CO2 24 19 - 32 mmol/L   Glucose, Bld 88 70 - 99 mg/dL   BUN 62 (H) 6 - 23 mg/dL   Creatinine, Ser 1.64 (H) 0.50 - 1.35 mg/dL   Calcium 7.8 (L) 8.4 - 10.5 mg/dL   Total Protein 4.8 (L) 6.0 - 8.3 g/dL   Albumin 2.6 (L) 3.5 - 5.2 g/dL   AST 15 0 - 37 U/L   ALT 21 0 - 53 U/L   Alkaline Phosphatase 26 (L) 39 - 117 U/L   Total Bilirubin 0.4 0.3 - 1.2 mg/dL   GFR calc non Af Amer 41 (L) >90 mL/min   GFR calc Af Amer 47 (L) >90 mL/min    Comment: (NOTE) The eGFR has been calculated using the CKD EPI equation. This calculation has not been validated in all clinical situations. eGFR's persistently <90 mL/min signify possible Chronic Kidney Disease.    Anion gap 5 5 - 15  Phenytoin level, total     Status: Abnormal   Collection Time: 03/05/15  5:43 AM  Result Value Ref Range   Phenytoin Lvl 5.1 (L) 10.0 - 20.0 ug/mL  CBC with Differential/Platelet     Status: Abnormal   Collection Time: 03/05/15  5:43 AM  Result Value Ref Range   WBC 9.2 4.0 - 10.5 K/uL   RBC 2.28 (L) 4.22 - 5.81 MIL/uL   Hemoglobin 7.3 (L) 13.0 - 17.0 g/dL   HCT 21.5 (L) 39.0 - 52.0 %   MCV 94.3 78.0 - 100.0 fL   MCH 32.0 26.0 - 34.0 pg   MCHC 34.0 30.0 - 36.0 g/dL   RDW 17.1 (H) 11.5 - 15.5 %   Platelets 89 (L) 150 - 400 K/uL    Comment: SPECIMEN  CHECKED FOR CLOTS PLATELET COUNT CONFIRMED BY SMEAR    Neutrophils Relative % 72 43 - 77 %   Neutro Abs 6.7 1.7 - 7.7 K/uL   Lymphocytes Relative 10 (L) 12 - 46 %   Lymphs Abs 0.9 0.7 - 4.0 K/uL   Monocytes Relative 8 3 - 12 %   Monocytes Absolute 0.7 0.1 - 1.0 K/uL   Eosinophils Relative 9 (H) 0 - 5 %  Eosinophils Absolute 0.8 (H) 0.0 - 0.7 K/uL   Basophils Relative 1 0 - 1 %   Basophils Absolute 0.1 0.0 - 0.1 K/uL   Smear Review PLATELETS APPEAR DECREASED   Glucose, capillary     Status: None   Collection Time: 03/05/15  6:00 AM  Result Value Ref Range   Glucose-Capillary 85 70 - 99 mg/dL    ABGS No results for input(s): PHART, PO2ART, TCO2, HCO3 in the last 72 hours.  Invalid input(s): PCO2 CULTURES Recent Results (from the past 240 hour(s))  MRSA PCR Screening     Status: Abnormal   Collection Time: 03/03/15 12:30 AM  Result Value Ref Range Status   MRSA by PCR POSITIVE (A) NEGATIVE Final    Comment:        The GeneXpert MRSA Assay (FDA approved for NASAL specimens only), is one component of a comprehensive MRSA colonization surveillance program. It is not intended to diagnose MRSA infection nor to guide or monitor treatment for MRSA infections. RESULT CALLED TO, READ BACK BY AND VERIFIED WITH:  LEE,B @ 0308 ON 03/03/15 BY WOODIE,J   KOH prep     Status: None   Collection Time: 03/03/15 10:00 AM  Result Value Ref Range Status   Specimen Description BRONCH BRUSH TIP  Final   Special Requests NONE  Final   KOH Prep   Final    NO YEAST OR FUNGAL ELEMENTS SEEN Performed at [facility]    Report Status 03/03/2015 FINAL  Final   Studies/Results: No results found.  Medications: I have reviewed the patient's current medications.  Assesment:   Principal Problem:   Upper GI bleed Active Problems:   Hypertension   Hypothyroid   Panhypopituitarism   Acute blood loss anemia   GI bleed   Bleeding gastrointestinal    Plan:  Medications  reviewed GI consult appreciated Will transfuse 2 units Will monitor CBC As per GI plan    LOS: 3 days   Kaylei Frink 03/05/2015, 7:58 AM

## 2015-03-05 NOTE — Progress Notes (Signed)
Peripherally Inserted Central Catheter/Midline Placement  The IV Nurse has discussed with the patient and/or persons authorized to consent for the patient, the purpose of this procedure and the potential benefits and risks involved with this procedure.  The benefits include less needle sticks, lab draws from the catheter and patient may be discharged home with the catheter.  Risks include, but not limited to, infection, bleeding, blood clot (thrombus formation), and puncture of an artery; nerve damage and irregular heat beat.  Alternatives to this procedure were also discussed.  PICC/Midline Placement Documentation  PICC / Midline Single Lumen 43/56/86 PICC Right Basilic 48 cm 2 cm (Active)  Indication for Insertion or Continuance of Line Limited venous access - need for IV therapy >5 days (PICC only) 03/05/2015  2:25 PM  Exposed Catheter (cm) 2 cm 03/05/2015  2:25 PM  Site Assessment Clean;Dry;Intact 03/05/2015  2:25 PM  Line Status Flushed;Saline locked;Capped (central line);Blood return noted 03/05/2015  2:25 PM  Dressing Type Transparent 03/05/2015  2:25 PM  Dressing Status Clean;Dry;Intact 03/05/2015  2:25 PM       Roselind Messier 03/05/2015, 2:42 PM

## 2015-03-06 ENCOUNTER — Inpatient Hospital Stay (HOSPITAL_COMMUNITY): Payer: Medicare HMO

## 2015-03-06 LAB — BASIC METABOLIC PANEL
Anion gap: 6 (ref 5–15)
BUN: 55 mg/dL — ABNORMAL HIGH (ref 6–23)
CO2: 23 mmol/L (ref 19–32)
CREATININE: 1.62 mg/dL — AB (ref 0.50–1.35)
Calcium: 7.7 mg/dL — ABNORMAL LOW (ref 8.4–10.5)
Chloride: 112 mmol/L (ref 96–112)
GFR, EST AFRICAN AMERICAN: 48 mL/min — AB (ref >90)
GFR, EST NON AFRICAN AMERICAN: 41 mL/min — AB (ref >90)
Glucose, Bld: 102 mg/dL — ABNORMAL HIGH (ref 70–99)
Potassium: 4.1 mmol/L (ref 3.5–5.1)
Sodium: 141 mmol/L (ref 135–145)

## 2015-03-06 LAB — CBC WITH DIFFERENTIAL/PLATELET
BASOS PCT: 1 % (ref 0–1)
Basophils Absolute: 0.1 10*3/uL (ref 0.0–0.1)
Eosinophils Absolute: 0.7 10*3/uL (ref 0.0–0.7)
Eosinophils Relative: 9 % — ABNORMAL HIGH (ref 0–5)
HCT: 23.6 % — ABNORMAL LOW (ref 39.0–52.0)
Hemoglobin: 7.9 g/dL — ABNORMAL LOW (ref 13.0–17.0)
Lymphocytes Relative: 11 % — ABNORMAL LOW (ref 12–46)
Lymphs Abs: 0.8 10*3/uL (ref 0.7–4.0)
MCH: 31.5 pg (ref 26.0–34.0)
MCHC: 33.5 g/dL (ref 30.0–36.0)
MCV: 94 fL (ref 78.0–100.0)
MONOS PCT: 9 % (ref 3–12)
Monocytes Absolute: 0.7 10*3/uL (ref 0.1–1.0)
Neutro Abs: 5.5 10*3/uL (ref 1.7–7.7)
Neutrophils Relative %: 71 % (ref 43–77)
Platelets: 89 10*3/uL — ABNORMAL LOW (ref 150–400)
RBC: 2.51 MIL/uL — ABNORMAL LOW (ref 4.22–5.81)
RDW: 16.9 % — ABNORMAL HIGH (ref 11.5–15.5)
WBC: 7.7 10*3/uL (ref 4.0–10.5)

## 2015-03-06 LAB — TYPE AND SCREEN
ABO/RH(D): B POS
Antibody Screen: NEGATIVE
Unit division: 0
Unit division: 0
Unit division: 0
Unit division: 0
Unit division: 0
Unit division: 0

## 2015-03-06 LAB — GLUCOSE, CAPILLARY
GLUCOSE-CAPILLARY: 120 mg/dL — AB (ref 70–99)
Glucose-Capillary: 101 mg/dL — ABNORMAL HIGH (ref 70–99)
Glucose-Capillary: 125 mg/dL — ABNORMAL HIGH (ref 70–99)
Glucose-Capillary: 127 mg/dL — ABNORMAL HIGH (ref 70–99)
Glucose-Capillary: 111 mg/dL — ABNORMAL HIGH (ref 70–99)

## 2015-03-06 MED ORDER — PANTOPRAZOLE SODIUM 40 MG PO TBEC
40.0000 mg | DELAYED_RELEASE_TABLET | Freq: Two times a day (BID) | ORAL | Status: DC
  Administered 2015-03-07 – 2015-03-10 (×7): 40 mg via ORAL
  Filled 2015-03-06 (×7): qty 1

## 2015-03-06 MED ORDER — LEVOTHYROXINE SODIUM 75 MCG PO TABS
175.0000 ug | ORAL_TABLET | Freq: Every day | ORAL | Status: DC
  Administered 2015-03-08 – 2015-03-10 (×3): 175 ug via ORAL
  Filled 2015-03-06 (×6): qty 1

## 2015-03-06 MED ORDER — SODIUM CHLORIDE 0.9 % IV SOLN
INTRAVENOUS | Status: DC
  Administered 2015-03-06: 17:00:00 via INTRAVENOUS

## 2015-03-06 MED ORDER — PHENYTOIN SODIUM EXTENDED 100 MG PO CAPS
300.0000 mg | ORAL_CAPSULE | ORAL | Status: DC
  Administered 2015-03-06 – 2015-03-09 (×3): 300 mg via ORAL
  Filled 2015-03-06 (×3): qty 3

## 2015-03-06 MED ORDER — PANTOPRAZOLE SODIUM 40 MG IV SOLR
40.0000 mg | Freq: Two times a day (BID) | INTRAVENOUS | Status: DC
  Administered 2015-03-06: 40 mg via INTRAVENOUS
  Filled 2015-03-06: qty 40

## 2015-03-06 MED ORDER — PHENYTOIN SODIUM EXTENDED 100 MG PO CAPS
200.0000 mg | ORAL_CAPSULE | ORAL | Status: DC
  Administered 2015-03-08: 200 mg via ORAL
  Filled 2015-03-06: qty 2

## 2015-03-06 NOTE — Procedures (Signed)
Small Bowel Givens Capsule Study Procedure date:  03/05/15 Capsule Report Read: 03/06/15  Referring Provider:  Dr. Laural Golden  PCP:  Dr. [name], MD  Indication for procedure:   69 year old male admitted with melena and acute blood loss anemia (Hgb 6.4), history of hematochezia in the past, with EGD negative for candida esophagitis, noted pyloric channel and post bulbar duodenitis but without obvious bleeding lesions. Colonoscopy with pooling of dark burgundy blood noted throughout colon but without obvious bleeding lesion. Capsule study now indicated to evaluate likely small bowel source of bleeding.    Findings:  Capsule study complete to the cecum. Blood-tinged contents starting around 2:17:07 without obvious lesion, progressing to outright overt, bright red blood throughout the entire remainder of the small bowel. No obvious bleeding lesion appreciated, but it is apparent that active bleeding is occurring. Small polypoid-like structure noted at 01:29:14  First Gastric image:  00:00:35 First Duodenal image: approximately 00:19:12 First Cecal image: 06:57:09 Gastric Passage time: 0h 108m Small Bowel Passage time:  6h 99m  Summary & Recommendations: Evidence of fresh blood starting more proximal in small bowel, query duodenal location as etiology, with blood noted throughout the remaining small bowel. Will arrange EGD with enteroscopy on 03/07/15 with Dr. Oneida Alar. Will allow full liquids for now, NPO after midnight, and plan on procedure 03/07/15. Further evaluation of polypoid area as noted above at a later time; appears to be a benign lesion.   Orvil Feil, ANP-BC [facility]    REVIEWED. PT DENIES MELENA.CONITNUES WITH TRANSFUSION DEPENDENT ANEMIA. PUSH ENTEROSCOPY TOMORROW. NEEDS GLUCAGON FOR PROCEDURE. EXPLAINED INDICATION TO PT AND POSSIBLE THERAPEUTIC BENEFITS.

## 2015-03-06 NOTE — Progress Notes (Signed)
The patient is receiving Synthroid, Levothyroxine by the intravenous route.  Based on criteria approved by the Pharmacy and [place], the medication is being converted to the equivalent oral dose form.  These criteria include: -No Active GI bleeding -Able to tolerate diet of full liquids (or better) or tube feeding OR able to tolerate other medications by the oral or enteral route  If you have any questions about this conversion, please contact the Pharmacy Department (ext [phone]).  Thank you.  Biagio Borg, [facility] 03/06/2015 5:04 PM

## 2015-03-06 NOTE — Care Management Utilization Note (Signed)
UR completed 

## 2015-03-06 NOTE — Progress Notes (Signed)
Subjective: Patient is resting. No new complaint. He was transfused 2 units PRBC but his hemoglobin is 7.9 this morning. I suspect he is still bleeding. Objective: Vital signs in last 24 hours: Temp:  [97.5 F (36.4 C)-98.6 F (37 C)] 97.8 F (36.6 C) (03/29 0400) Pulse Rate:  [67-84] 72 (03/29 0600) Resp:  [12-21] 12 (03/29 0600) BP: (107-132)/(32-51) 127/46 mmHg (03/29 0600) SpO2:  [98 %-100 %] 100 % (03/29 0646) FiO2 (%):  [28 %] 28 % (03/28 2030) Weight:  [117.2 kg (258 lb 6.1 oz)-118.389 kg (261 lb)] 117.2 kg (258 lb 6.1 oz) (03/29 0500) Weight change: -0.111 kg (-3.9 oz) Last BM Date: 03/04/15  Intake/Output from previous day: 03/28 0701 - 03/29 0700 In: 835 [P.O.:240; I.V.:260; Blood:335] Out: 2200 [Urine:2200]  PHYSICAL EXAM General appearance: alert and no distress Resp: diminished breath sounds bilaterally and rhonchi bilaterally Cardio: S1, S2 normal GI: soft, non-tender; bowel sounds normal; no masses,  no organomegaly Extremities: extremities normal, atraumatic, no cyanosis or edema  Lab Results:  Results for orders placed or performed during the hospital encounter of 03/02/15 (from the past 48 hour(s))  Basic metabolic panel     Status: Abnormal   Collection Time: 03/04/15  9:27 AM  Result Value Ref Range   Sodium 144 135 - 145 mmol/L   Potassium 5.0 3.5 - 5.1 mmol/L   Chloride 117 (H) 96 - 112 mmol/L   CO2 19 19 - 32 mmol/L   Glucose, Bld 78 70 - 99 mg/dL   BUN 84 (H) 6 - 23 mg/dL   Creatinine, Ser 1.90 (H) 0.50 - 1.35 mg/dL   Calcium 7.8 (L) 8.4 - 10.5 mg/dL   GFR calc non Af Amer 34 (L) >90 mL/min   GFR calc Af Amer 40 (L) >90 mL/min    Comment: (NOTE) The eGFR has been calculated using the CKD EPI equation. This calculation has not been validated in all clinical situations. eGFR's persistently <90 mL/min signify possible Chronic Kidney Disease.    Anion gap 8 5 - 15  CBC with Differential/Platelet     Status: Abnormal   Collection Time: 03/04/15   9:27 AM  Result Value Ref Range   WBC 10.4 4.0 - 10.5 K/uL   RBC 2.49 (L) 4.22 - 5.81 MIL/uL   Hemoglobin 7.6 (L) 13.0 - 17.0 g/dL   HCT 22.8 (L) 39.0 - 52.0 %   MCV 91.6 78.0 - 100.0 fL   MCH 30.5 26.0 - 34.0 pg   MCHC 33.3 30.0 - 36.0 g/dL   RDW 17.2 (H) 11.5 - 15.5 %   Platelets 103 (L) 150 - 400 K/uL    Comment: SPECIMEN CHECKED FOR CLOTS PLATELET COUNT CONFIRMED BY SMEAR    Neutrophils Relative % 71 43 - 77 %   Neutro Abs 7.4 1.7 - 7.7 K/uL   Lymphocytes Relative 10 (L) 12 - 46 %   Lymphs Abs 1.1 0.7 - 4.0 K/uL   Monocytes Relative 9 3 - 12 %   Monocytes Absolute 1.0 0.1 - 1.0 K/uL   Eosinophils Relative 9 (H) 0 - 5 %   Eosinophils Absolute 0.9 (H) 0.0 - 0.7 K/uL   Basophils Relative 1 0 - 1 %   Basophils Absolute 0.1 0.0 - 0.1 K/uL  Prepare RBC     Status: None   Collection Time: 03/04/15 10:00 AM  Result Value Ref Range   Order Confirmation ORDER PROCESSED BY BLOOD BANK   Glucose, capillary     Status: Abnormal  Collection Time: 03/04/15 10:54 AM  Result Value Ref Range   Glucose-Capillary 115 (H) 70 - 99 mg/dL  Hemoglobin and hematocrit, blood     Status: Abnormal   Collection Time: 03/04/15  4:34 PM  Result Value Ref Range   Hemoglobin 8.1 (L) 13.0 - 17.0 g/dL   HCT 24.0 (L) 39.0 - 52.0 %  Glucose, capillary     Status: Abnormal   Collection Time: 03/04/15  4:46 PM  Result Value Ref Range   Glucose-Capillary 128 (H) 70 - 99 mg/dL  H. pylori antibody, IgG     Status: None   Collection Time: 03/04/15 11:25 PM  Result Value Ref Range   H Pylori IgG <0.9 0.0 - 0.8 U/mL    Comment: (NOTE)                             Negative            <0.9                             Indeterminate  0.9 - 1.0                             Positive            >1.0 Performed At: [facility] [place], [place] [id number] Lindon Romp MD MP:[id number]   Glucose, capillary     Status: None   Collection Time: 03/04/15 11:31 PM  Result Value Ref Range    Glucose-Capillary 81 70 - 99 mg/dL  Comprehensive metabolic panel     Status: Abnormal   Collection Time: 03/05/15  5:43 AM  Result Value Ref Range   Sodium 141 135 - 145 mmol/L   Potassium 4.1 3.5 - 5.1 mmol/L    Comment: DELTA CHECK NOTED   Chloride 112 96 - 112 mmol/L   CO2 24 19 - 32 mmol/L   Glucose, Bld 88 70 - 99 mg/dL   BUN 62 (H) 6 - 23 mg/dL   Creatinine, Ser 1.64 (H) 0.50 - 1.35 mg/dL   Calcium 7.8 (L) 8.4 - 10.5 mg/dL   Total Protein 4.8 (L) 6.0 - 8.3 g/dL   Albumin 2.6 (L) 3.5 - 5.2 g/dL   AST 15 0 - 37 U/L   ALT 21 0 - 53 U/L   Alkaline Phosphatase 26 (L) 39 - 117 U/L   Total Bilirubin 0.4 0.3 - 1.2 mg/dL   GFR calc non Af Amer 41 (L) >90 mL/min   GFR calc Af Amer 47 (L) >90 mL/min    Comment: (NOTE) The eGFR has been calculated using the CKD EPI equation. This calculation has not been validated in all clinical situations. eGFR's persistently <90 mL/min signify possible Chronic Kidney Disease.    Anion gap 5 5 - 15  Phenytoin level, total     Status: Abnormal   Collection Time: 03/05/15  5:43 AM  Result Value Ref Range   Phenytoin Lvl 5.1 (L) 10.0 - 20.0 ug/mL  CBC with Differential/Platelet     Status: Abnormal   Collection Time: 03/05/15  5:43 AM  Result Value Ref Range   WBC 9.2 4.0 - 10.5 K/uL   RBC 2.28 (L) 4.22 - 5.81 MIL/uL   Hemoglobin 7.3 (L) 13.0 - 17.0 g/dL   HCT 21.5 (L) 39.0 - 52.0 %  MCV 94.3 78.0 - 100.0 fL   MCH 32.0 26.0 - 34.0 pg   MCHC 34.0 30.0 - 36.0 g/dL   RDW 17.1 (H) 11.5 - 15.5 %   Platelets 89 (L) 150 - 400 K/uL    Comment: SPECIMEN CHECKED FOR CLOTS PLATELET COUNT CONFIRMED BY SMEAR    Neutrophils Relative % 72 43 - 77 %   Neutro Abs 6.7 1.7 - 7.7 K/uL   Lymphocytes Relative 10 (L) 12 - 46 %   Lymphs Abs 0.9 0.7 - 4.0 K/uL   Monocytes Relative 8 3 - 12 %   Monocytes Absolute 0.7 0.1 - 1.0 K/uL   Eosinophils Relative 9 (H) 0 - 5 %   Eosinophils Absolute 0.8 (H) 0.0 - 0.7 K/uL   Basophils Relative 1 0 - 1 %   Basophils  Absolute 0.1 0.0 - 0.1 K/uL   Smear Review PLATELETS APPEAR DECREASED   Glucose, capillary     Status: None   Collection Time: 03/05/15  6:00 AM  Result Value Ref Range   Glucose-Capillary 85 70 - 99 mg/dL  Glucose, capillary     Status: None   Collection Time: 03/05/15 11:26 AM  Result Value Ref Range   Glucose-Capillary 93 70 - 99 mg/dL   Comment 1 Notify RN   Basic metabolic panel     Status: Abnormal   Collection Time: 03/06/15  4:30 AM  Result Value Ref Range   Sodium 141 135 - 145 mmol/L   Potassium 4.1 3.5 - 5.1 mmol/L   Chloride 112 96 - 112 mmol/L   CO2 23 19 - 32 mmol/L   Glucose, Bld 102 (H) 70 - 99 mg/dL   BUN 55 (H) 6 - 23 mg/dL   Creatinine, Ser 1.62 (H) 0.50 - 1.35 mg/dL   Calcium 7.7 (L) 8.4 - 10.5 mg/dL   GFR calc non Af Amer 41 (L) >90 mL/min   GFR calc Af Amer 48 (L) >90 mL/min    Comment: (NOTE) The eGFR has been calculated using the CKD EPI equation. This calculation has not been validated in all clinical situations. eGFR's persistently <90 mL/min signify possible Chronic Kidney Disease.    Anion gap 6 5 - 15  CBC with Differential/Platelet     Status: Abnormal   Collection Time: 03/06/15  4:30 AM  Result Value Ref Range   WBC 7.7 4.0 - 10.5 K/uL   RBC 2.51 (L) 4.22 - 5.81 MIL/uL   Hemoglobin 7.9 (L) 13.0 - 17.0 g/dL   HCT 23.6 (L) 39.0 - 52.0 %   MCV 94.0 78.0 - 100.0 fL   MCH 31.5 26.0 - 34.0 pg   MCHC 33.5 30.0 - 36.0 g/dL   RDW 16.9 (H) 11.5 - 15.5 %   Platelets 89 (L) 150 - 400 K/uL    Comment: SPECIMEN CHECKED FOR CLOTS CONSISTENT WITH PREVIOUS RESULT    Neutrophils Relative % 71 43 - 77 %   Neutro Abs 5.5 1.7 - 7.7 K/uL   Lymphocytes Relative 11 (L) 12 - 46 %   Lymphs Abs 0.8 0.7 - 4.0 K/uL   Monocytes Relative 9 3 - 12 %   Monocytes Absolute 0.7 0.1 - 1.0 K/uL   Eosinophils Relative 9 (H) 0 - 5 %   Eosinophils Absolute 0.7 0.0 - 0.7 K/uL   Basophils Relative 1 0 - 1 %   Basophils Absolute 0.1 0.0 - 0.1 K/uL    ABGS No results for  input(s): PHART, PO2ART, TCO2, HCO3 in  the last 72 hours.  Invalid input(s): PCO2 CULTURES Recent Results (from the past 240 hour(s))  MRSA PCR Screening     Status: Abnormal   Collection Time: 03/03/15 12:30 AM  Result Value Ref Range Status   MRSA by PCR POSITIVE (A) NEGATIVE Final    Comment:        The GeneXpert MRSA Assay (FDA approved for NASAL specimens only), is one component of a comprehensive MRSA colonization surveillance program. It is not intended to diagnose MRSA infection nor to guide or monitor treatment for MRSA infections. RESULT CALLED TO, READ BACK BY AND VERIFIED WITH:  LEE,B @ 0308 ON 03/03/15 BY WOODIE,J   KOH prep     Status: None   Collection Time: 03/03/15 10:00 AM  Result Value Ref Range Status   Specimen Description BRONCH BRUSH TIP  Final   Special Requests NONE  Final   KOH Prep   Final    NO YEAST OR FUNGAL ELEMENTS SEEN Performed at [facility]    Report Status 03/03/2015 FINAL  Final   Studies/Results: No results found.  Medications: I have reviewed the patient's current medications.  Assesment:   Principal Problem:   Upper GI bleed Active Problems:   Hypertension   Hypothyroid   Panhypopituitarism   Acute blood loss anemia   GI bleed   Bleeding gastrointestinal    Plan:  Medications reviewed Will follow result of capsular study Will monitor CBC As per GI plan    LOS: 4 days   Debbra Digiulio 03/06/2015, 8:19 AM

## 2015-03-06 NOTE — Progress Notes (Signed)
Givens capsule to be read today. Discussed with Serena Colonel, RN taking care of patient today. No overt signs of GI bleeding and tolerating diet. Preliminary report of capsule shows overt, bright red, active bleeding in proximal duodenum. Full report to follow shortly. Will have him start full liquids after lunch, NPO after midnight, and EGD with enteroscopy on 3/30 with Dr. Oneida Alar. Full report to follow.

## 2015-03-06 NOTE — Progress Notes (Signed)
Called report to [employer], RN on dept [phone].  Verbalized understanding.  Pt transferred to room 335 in safe and stable condition. Schonewitz, Eulis Canner 03/06/2015

## 2015-03-06 NOTE — Care Management Note (Addendum)
    Page 1 of 2   03/09/2015     2:42:02 PM CARE MANAGEMENT NOTE 03/09/2015  Patient:  [name] [name], [name] [name]   Account Number:  [id number]  Date Initiated:  03/06/2015  Documentation initiated by:  CHILDRESS,JESSICA  Subjective/Objective Assessment:   Pt is from home, lives alone. Pt has home O2, CPAP and neb machine from [place]. Pt has walker. Pt is active with [facility] PT/OT/RN. Pt plans to discharge home with self care.     Action/Plan:   Will need order to resume [place] services at discharge. Will cont to follow for CM needs.   Anticipated DC Date:  03/06/2015   Anticipated DC Plan:  [place]  CM consult      [facility] Choice  DURABLE MEDICAL EQUIPMENT  Resumption Of Svcs/PTA Provider   Choice offered to / List presented to:  C-1 Patient   DME arranged  3-N-1      DME agency  [place] arranged  HH-1 RN  [place].   Status of service:  In process, will continue to follow Medicare Important Message given?  YES (If response is "NO", the following Medicare IM given date fields will be blank) Date Medicare IM given:  03/09/2015 Medicare IM given by:  Theophilus Kinds Date Additional Medicare IM given:   Additional Medicare IM given by:    Discharge Disposition:  [place]  Per UR Regulation:  Reviewed for med. necessity/level of care/duration of stay  If discussed at [place] of Stay Meetings, dates discussed:   03/08/2015    Comments:  03/09/15 Ginger Blue, RN BSN CM Anticipate discharge within 24 hours as long as hemoglbin remains stable. [place] services resumed with [facility] RN and PT (per pts choice). Romualdo Bolk of [facility] is aware and will collect the pts information from the chart. [place] services to resume within 48 hours of discharge. 3N1 arranged and will be delivered to pts room. Weekend staff to call and fax home health orders to [facility] at  discharge. Pt and pts nurse aware of discharge arrangements.  03/06/2015 [place], RN, MSN, CM Pt ordered 3 in 1, Pt chooses [facility] for DME needs. Emma of [facility] notified of referral and will obtain pt information from chart and deliver 3 in 1 to pt's room.

## 2015-03-07 ENCOUNTER — Encounter (HOSPITAL_COMMUNITY)
Admission: EM | Disposition: A | Discharge status: Home / Self Care | Payer: Self-pay | Source: Home / Self Care | Attending: Internal Medicine

## 2015-03-07 ENCOUNTER — Encounter (HOSPITAL_COMMUNITY): Payer: Self-pay | Admitting: *Deleted

## 2015-03-07 HISTORY — PX: ESOPHAGOGASTRODUODENOSCOPY: SHX5428

## 2015-03-07 LAB — CBC WITH DIFFERENTIAL/PLATELET
BASOS ABS: 0.1 10*3/uL (ref 0.0–0.1)
Basophils Relative: 1 % (ref 0–1)
EOS PCT: 10 % — AB (ref 0–5)
Eosinophils Absolute: 0.8 10*3/uL — ABNORMAL HIGH (ref 0.0–0.7)
HCT: 23.8 % — ABNORMAL LOW (ref 39.0–52.0)
HEMOGLOBIN: 7.8 g/dL — AB (ref 13.0–17.0)
Lymphocytes Relative: 12 % (ref 12–46)
Lymphs Abs: 1 10*3/uL (ref 0.7–4.0)
MCH: 31.1 pg (ref 26.0–34.0)
MCHC: 32.8 g/dL (ref 30.0–36.0)
MCV: 94.8 fL (ref 78.0–100.0)
Monocytes Absolute: 0.8 10*3/uL (ref 0.1–1.0)
Monocytes Relative: 10 % (ref 3–12)
Neutro Abs: 5.5 10*3/uL (ref 1.7–7.7)
Neutrophils Relative %: 67 % (ref 43–77)
Platelets: 87 10*3/uL — ABNORMAL LOW (ref 150–400)
RBC: 2.51 MIL/uL — AB (ref 4.22–5.81)
RDW: 17.8 % — ABNORMAL HIGH (ref 11.5–15.5)
SMEAR REVIEW: DECREASED
WBC: 8.2 10*3/uL (ref 4.0–10.5)

## 2015-03-07 LAB — KOH PREP: KOH PREP: NONE SEEN

## 2015-03-07 LAB — GLUCOSE, CAPILLARY
GLUCOSE-CAPILLARY: 117 mg/dL — AB (ref 70–99)
Glucose-Capillary: 105 mg/dL — ABNORMAL HIGH (ref 70–99)
Glucose-Capillary: 83 mg/dL (ref 70–99)
Glucose-Capillary: 88 mg/dL (ref 70–99)
Glucose-Capillary: 94 mg/dL (ref 70–99)

## 2015-03-07 SURGERY — EGD (ESOPHAGOGASTRODUODENOSCOPY)
Anesthesia: Moderate Sedation

## 2015-03-07 MED ORDER — GLUCAGON HCL RDNA (DIAGNOSTIC) 1 MG IJ SOLR
INTRAMUSCULAR | Status: AC
  Filled 2015-03-07: qty 1

## 2015-03-07 MED ORDER — MIDAZOLAM HCL 5 MG/5ML IJ SOLN
INTRAMUSCULAR | Status: DC | PRN
  Administered 2015-03-07: 1 mg via INTRAVENOUS
  Administered 2015-03-07: 2 mg via INTRAVENOUS
  Administered 2015-03-07: 1 mg via INTRAVENOUS

## 2015-03-07 MED ORDER — MIDAZOLAM HCL 5 MG/5ML IJ SOLN
INTRAMUSCULAR | Status: AC
  Filled 2015-03-07: qty 10

## 2015-03-07 MED ORDER — LIDOCAINE VISCOUS 2 % MT SOLN
OROMUCOSAL | Status: DC | PRN
  Administered 2015-03-07: 4 mL via OROMUCOSAL

## 2015-03-07 MED ORDER — GLUCAGON HCL (RDNA) 1 MG IJ SOLR
INTRAMUSCULAR | Status: DC | PRN
  Administered 2015-03-07: .5 mg via INTRAVENOUS

## 2015-03-07 MED ORDER — MEPERIDINE HCL 100 MG/ML IJ SOLN
INTRAMUSCULAR | Status: AC
  Filled 2015-03-07: qty 2

## 2015-03-07 MED ORDER — LIDOCAINE VISCOUS 2 % MT SOLN
OROMUCOSAL | Status: AC
  Filled 2015-03-07: qty 15

## 2015-03-07 MED ORDER — SPOT INK MARKER SYRINGE KIT
PACK | SUBMUCOSAL | Status: DC | PRN
  Administered 2015-03-07: 2 mL via SUBMUCOSAL

## 2015-03-07 MED ORDER — STERILE WATER FOR IRRIGATION IR SOLN
Status: DC | PRN
  Administered 2015-03-07: 12:00:00

## 2015-03-07 NOTE — Op Note (Addendum)
[place], [address]   ENTEROSCOPY PROCEDURE REPORT     EXAM DATE: 03/07/2015  PATIENT NAME:      [name] [name]           MR #:      [id number] BIRTHDATE:       23-Oct-1946      VISIT #:     [id number]  ATTENDING:     Danie Binder, MD     STATUS:     outpatient ASSISTANT: REFERRING MD: Conni Slipper, M.D.  Garfield Cornea, M.D. [facility] CLASS:  INDICATIONS:  The patient is a 69 yr old male here for an enteroscopy procedure due to OBSCURE GI BLEED/BRIGHT RED BLOOD SEEN ON GIVENS CAPSULE 2 HRS AFTER IT [place]. PROCEDURE PERFORMED:     Small bowel enteroscopy with biopsy  AND SUBMUCOSAL INJECTION OF SPOT  MEDICATIONS:     Demerol 25 mg IV, Versed 4 mg IV, and Glucagon 0.5 mg IV CONSENT: The patient understands the risks and benefits of the procedure and understands that these risks include, but are not limited to: sedation, allergic reaction, infection, perforation and/or bleeding. Alternative means of evaluation and treatment include, among others: physical exam, x-rays, and/or surgical intervention. The patient elects to proceed with this endoscopic procedure.  DESCRIPTION OF PROCEDURE: During intra-op preparation period all mechanical & medical equipment was checked for proper function. Hand hygiene and appropriate measures for infection prevention was taken. After the risks, benefits and alternatives of the procedure were thoroughly explained, Informed consent was verified, confirmed and timeout was successfully executed by the treatment team. The [id number] ([id number]) endoscope was introduced through the mouth and advanced to the proximal jejunum. The prep was The overall prep quality was excellent.. The instrument was then slowly withdrawn while examining the mucosa circumferentially. The scope was then completely withdrawn from the patient and the procedure terminated. The pulse, BP, and O2 saturation were  monitored and documented by the physician and the nursing staff throughout the entire procedure.  The patient was cared for as planned according to standard protocol, then discharged to recovery in stable condition and with appropriate post procedure care. Estimated blood loss is zero unless otherwise noted in this procedure report.  ESOPHAGUS: FREQUENT WHITE PLAQUES IN PROXIMAL ESOPHAGUS AND RARE WHITE PLAQUES IN DISTAL ESOPHAGUS.  BRUSH BIOPSIES OBTAINED. STOMACH: There was mild gastritis in the gastric antrum. DUODENUM: The duodenal mucosa showed no abnormalities in the 3rd part of the duodenum, 2nd part duodenum, and duodenal bulb. JEJUNUM: The exam showed no abnormalities in the jejunum.  2 CC SPOT INJECTED  150 CM FROM THE TEETH.    ADVERSE EVENTS:      There were no immediate complications.  IMPRESSIONS:     1.  PROBABLE CANDIDA ESOPHAGITIS 2.  Mild gastritis in the gastric antrum 3.  SOURCE FOR GI BLEED NOT IDENTIFIED  RECOMMENDATIONS:     1.  Await BRUSH biopsy results 2.  DAILY PPI 3. [facility] DIET 4. MAY RE-START ASPIRIN IN 2 WEEKS. MONITOR FOR RECURRENT GI BLEED. IF RE-OCCURS PT WILL NEED TRANSFER TO [place] FOR ANTEGRADE DOUBLE BALLOON ENTEROSCOPY RECALL:   Danie Binder, MD eSigned:  Danie Binder, MD 03/07/2015 12:51 PM Revised: 03/07/2015 12:51 PM cc:

## 2015-03-07 NOTE — Progress Notes (Signed)
Patients BP was 127/43, paged on-call MD to questions BP medications. Followed MD order to give patient blood pressure medications. Will continue to monitor patient.
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
## 2015-03-07 NOTE — Progress Notes (Signed)
Subjective: Patient is resting. He continued to have rectal bleeding. His capsulare study is also showing bleeding. [name] scheduled for EGD.  Objective: Vital signs in last 24 hours: Temp:  [97.6 F (36.4 C)-98.8 F (37.1 C)] 98.8 F (37.1 C) (03/30 1136) Pulse Rate:  [72-87] 78 (03/30 1225) Resp:  [10-21] 21 (03/30 1225) BP: (106-149)/(35-94) 131/58 mmHg (03/30 1225) SpO2:  [98 %-100 %] 100 % (03/30 1225) FiO2 (%):  [98 %] 98 % (03/30 0734) Weight:  [117 kg (257 lb 15 oz)] 117 kg (257 lb 15 oz) (03/30 0500) Weight change: -1.389 kg (-3 lb 1 oz) Last BM Date: 03/04/15  Intake/Output from previous day: 03/29 0701 - 03/30 0700 In: 1120 [P.O.:1120] Out: 1325 [Urine:1325]  PHYSICAL EXAM General appearance: alert and no distress Resp: diminished breath sounds bilaterally and rhonchi bilaterally Cardio: S1, S2 normal GI: soft, non-tender; bowel sounds normal; no masses,  no organomegaly Extremities: extremities normal, atraumatic, no cyanosis or edema  Lab Results:  Results for orders placed or performed during the hospital encounter of 03/02/15 (from the past 48 hour(s))  Glucose, capillary     Status: Abnormal   Collection Time: 03/05/15  4:13 PM  Result Value Ref Range   Glucose-Capillary 127 (H) 70 - 99 mg/dL  Glucose, capillary     Status: Abnormal   Collection Time: 03/06/15 12:01 AM  Result Value Ref Range   Glucose-Capillary 125 (H) 70 - 99 mg/dL   Comment 1 Notify RN   Basic metabolic panel     Status: Abnormal   Collection Time: 03/06/15  4:30 AM  Result Value Ref Range   Sodium 141 135 - 145 mmol/L   Potassium 4.1 3.5 - 5.1 mmol/L   Chloride 112 96 - 112 mmol/L   CO2 23 19 - 32 mmol/L   Glucose, Bld 102 (H) 70 - 99 mg/dL   BUN 55 (H) 6 - 23 mg/dL   Creatinine, Ser 1.62 (H) 0.50 - 1.35 mg/dL   Calcium 7.7 (L) 8.4 - 10.5 mg/dL   GFR calc non Af Amer 41 (L) >90 mL/min   GFR calc Af Amer 48 (L) >90 mL/min    Comment: (NOTE) The eGFR has been calculated using  the CKD EPI equation. This calculation has not been validated in all clinical situations. eGFR's persistently <90 mL/min signify possible Chronic Kidney Disease.    Anion gap 6 5 - 15  CBC with Differential/Platelet     Status: Abnormal   Collection Time: 03/06/15  4:30 AM  Result Value Ref Range   WBC 7.7 4.0 - 10.5 K/uL   RBC 2.51 (L) 4.22 - 5.81 MIL/uL   Hemoglobin 7.9 (L) 13.0 - 17.0 g/dL   HCT 23.6 (L) 39.0 - 52.0 %   MCV 94.0 78.0 - 100.0 fL   MCH 31.5 26.0 - 34.0 pg   MCHC 33.5 30.0 - 36.0 g/dL   RDW 16.9 (H) 11.5 - 15.5 %   Platelets 89 (L) 150 - 400 K/uL    Comment: SPECIMEN CHECKED FOR CLOTS CONSISTENT WITH PREVIOUS RESULT    Neutrophils Relative % 71 43 - 77 %   Neutro Abs 5.5 1.7 - 7.7 K/uL   Lymphocytes Relative 11 (L) 12 - 46 %   Lymphs Abs 0.8 0.7 - 4.0 K/uL   Monocytes Relative 9 3 - 12 %   Monocytes Absolute 0.7 0.1 - 1.0 K/uL   Eosinophils Relative 9 (H) 0 - 5 %   Eosinophils Absolute 0.7 0.0 - 0.7  K/uL   Basophils Relative 1 0 - 1 %   Basophils Absolute 0.1 0.0 - 0.1 K/uL  Glucose, capillary     Status: Abnormal   Collection Time: 03/06/15  6:10 AM  Result Value Ref Range   Glucose-Capillary 101 (H) 70 - 99 mg/dL   Comment 1 Notify RN   Glucose, capillary     Status: Abnormal   Collection Time: 03/06/15 11:05 AM  Result Value Ref Range   Glucose-Capillary 120 (H) 70 - 99 mg/dL   Comment 1 Repeat Test   Glucose, capillary     Status: Abnormal   Collection Time: 03/06/15  4:21 PM  Result Value Ref Range   Glucose-Capillary 111 (H) 70 - 99 mg/dL   Comment 1 Notify RN   Glucose, capillary     Status: None   Collection Time: 03/06/15 11:58 PM  Result Value Ref Range   Glucose-Capillary 83 70 - 99 mg/dL  Glucose, capillary     Status: None   Collection Time: 03/07/15  6:12 AM  Result Value Ref Range   Glucose-Capillary 88 70 - 99 mg/dL  CBC with Differential/Platelet     Status: Abnormal   Collection Time: 03/07/15  7:08 AM  Result Value Ref Range    WBC 8.2 4.0 - 10.5 K/uL   RBC 2.51 (L) 4.22 - 5.81 MIL/uL   Hemoglobin 7.8 (L) 13.0 - 17.0 g/dL   HCT 23.8 (L) 39.0 - 52.0 %   MCV 94.8 78.0 - 100.0 fL   MCH 31.1 26.0 - 34.0 pg   MCHC 32.8 30.0 - 36.0 g/dL   RDW 17.8 (H) 11.5 - 15.5 %   Platelets 87 (L) 150 - 400 K/uL    Comment: SPECIMEN CHECKED FOR CLOTS PLATELET COUNT CONFIRMED BY SMEAR    Neutrophils Relative % 67 43 - 77 %   Neutro Abs 5.5 1.7 - 7.7 K/uL   Lymphocytes Relative 12 12 - 46 %   Lymphs Abs 1.0 0.7 - 4.0 K/uL   Monocytes Relative 10 3 - 12 %   Monocytes Absolute 0.8 0.1 - 1.0 K/uL   Eosinophils Relative 10 (H) 0 - 5 %   Eosinophils Absolute 0.8 (H) 0.0 - 0.7 K/uL   Basophils Relative 1 0 - 1 %   Basophils Absolute 0.1 0.0 - 0.1 K/uL   Smear Review PLATELETS APPEAR DECREASED   Glucose, capillary     Status: Abnormal   Collection Time: 03/07/15 12:41 PM  Result Value Ref Range   Glucose-Capillary 117 (H) 70 - 99 mg/dL   Comment 1 Notify RN    Comment 2 Document in Chart     ABGS No results for input(s): PHART, PO2ART, TCO2, HCO3 in the last 72 hours.  Invalid input(s): PCO2 CULTURES Recent Results (from the past 240 hour(s))  MRSA PCR Screening     Status: Abnormal   Collection Time: 03/03/15 12:30 AM  Result Value Ref Range Status   MRSA by PCR POSITIVE (A) NEGATIVE Final    Comment:        The GeneXpert MRSA Assay (FDA approved for NASAL specimens only), is one component of a comprehensive MRSA colonization surveillance program. It is not intended to diagnose MRSA infection nor to guide or monitor treatment for MRSA infections. RESULT CALLED TO, READ BACK BY AND VERIFIED WITH:  LEE,B @ 0308 ON 03/03/15 BY WOODIE,J   KOH prep     Status: None   Collection Time: 03/03/15 10:00 AM  Result Value Ref Range  Status   Specimen Description BRONCH BRUSH TIP  Final   Special Requests NONE  Final   KOH Prep   Final    NO YEAST OR FUNGAL ELEMENTS SEEN Performed at [facility]    Report  Status 03/03/2015 FINAL  Final   Studies/Results: Dg Chest Port 1 View  03/06/2015   CLINICAL DATA:  PICC line placement  EXAM: PORTABLE CHEST - 1 VIEW  COMPARISON:  03/02/2015  FINDINGS: Right PICC line is in place with the tip near the cavoatrial junction in the lower SVC. Cardiomegaly with vascular congestion. No overt edema. Minimal bibasilar atelectasis. No effusions. No acute bony abnormality.  IMPRESSION: Right PICC line tip in the lower SVC near the cavoatrial junction.  Cardiomegaly, vascular congestion.   Electronically Signed   By: Rolm Baptise M.D.   On: 03/06/2015 17:10    Medications: I have reviewed the patient's current medications.  Assesment:   Principal Problem:   Upper GI bleed Active Problems:   Hypertension   Hypothyroid   Panhypopituitarism   Acute blood loss anemia   GI bleed   Bleeding gastrointestinal    Plan:  Medications reviewed Will monitor CBC As per GI plan    LOS: 5 days   Stoy Fenn 03/07/2015, 1:07 PM

## 2015-03-08 DIAGNOSIS — D6959 Other secondary thrombocytopenia: Secondary | ICD-10-CM | POA: Insufficient documentation

## 2015-03-08 LAB — CBC WITH DIFFERENTIAL/PLATELET
BASOS PCT: 1 % (ref 0–1)
Basophils Absolute: 0.1 10*3/uL (ref 0.0–0.1)
EOS ABS: 0.8 10*3/uL — AB (ref 0.0–0.7)
Eosinophils Relative: 9 % — ABNORMAL HIGH (ref 0–5)
HCT: 23.1 % — ABNORMAL LOW (ref 39.0–52.0)
Hemoglobin: 7.5 g/dL — ABNORMAL LOW (ref 13.0–17.0)
LYMPHS ABS: 0.9 10*3/uL (ref 0.7–4.0)
Lymphocytes Relative: 11 % — ABNORMAL LOW (ref 12–46)
MCH: 31.1 pg (ref 26.0–34.0)
MCHC: 32.5 g/dL (ref 30.0–36.0)
MCV: 95.9 fL (ref 78.0–100.0)
MONOS PCT: 7 % (ref 3–12)
Monocytes Absolute: 0.6 10*3/uL (ref 0.1–1.0)
Neutro Abs: 6 10*3/uL (ref 1.7–7.7)
Neutrophils Relative %: 72 % (ref 43–77)
Platelets: 95 10*3/uL — ABNORMAL LOW (ref 150–400)
RBC: 2.41 MIL/uL — ABNORMAL LOW (ref 4.22–5.81)
RDW: 18 % — ABNORMAL HIGH (ref 11.5–15.5)
WBC: 8.4 10*3/uL (ref 4.0–10.5)

## 2015-03-08 LAB — GLUCOSE, CAPILLARY
GLUCOSE-CAPILLARY: 87 mg/dL (ref 70–99)
Glucose-Capillary: 116 mg/dL — ABNORMAL HIGH (ref 70–99)

## 2015-03-08 LAB — PREPARE RBC (CROSSMATCH)

## 2015-03-08 MED ORDER — SODIUM CHLORIDE 0.9 % IV SOLN
Freq: Once | INTRAVENOUS | Status: AC
  Administered 2015-03-08: 14:00:00 via INTRAVENOUS

## 2015-03-08 MED ORDER — IPRATROPIUM BROMIDE 0.02 % IN SOLN
0.5000 mg | Freq: Three times a day (TID) | RESPIRATORY_TRACT | Status: DC
  Administered 2015-03-08 – 2015-03-10 (×5): 0.5 mg via RESPIRATORY_TRACT
  Filled 2015-03-08 (×6): qty 2.5

## 2015-03-08 NOTE — Progress Notes (Signed)
Subjective: Patient is resting. Patient continued to have rectal bleeding. [name] being evaluated by GI. Objective: Vital signs in last 24 hours: Temp:  [98.2 F (36.8 C)-98.8 F (37.1 C)] 98.2 F (36.8 C) (03/31 0556) Pulse Rate:  [72-87] 77 (03/31 0556) Resp:  [10-21] 20 (03/31 0556) BP: (115-149)/(40-94) 122/41 mmHg (03/31 0556) SpO2:  [98 %-100 %] 99 % (03/31 0806) FiO2 (%):  [21 %-96 %] 21 % (03/31 0806) Weight:  [108.138 kg (238 lb 6.4 oz)] 108.138 kg (238 lb 6.4 oz) (03/31 0556) Weight change: -8.862 kg (-19 lb 8.6 oz) Last BM Date: 03/08/15  Intake/Output from previous day: 03/30 0701 - 03/31 0700 In: 0  Out: 1500 [Urine:1500]  PHYSICAL EXAM General appearance: alert and no distress Resp: diminished breath sounds bilaterally and rhonchi bilaterally Cardio: S1, S2 normal GI: soft, non-tender; bowel sounds normal; no masses,  no organomegaly Extremities: extremities normal, atraumatic, no cyanosis or edema  Lab Results:  Results for orders placed or performed during the hospital encounter of 03/02/15 (from the past 48 hour(s))  Glucose, capillary     Status: Abnormal   Collection Time: 03/06/15 11:05 AM  Result Value Ref Range   Glucose-Capillary 120 (H) 70 - 99 mg/dL   Comment 1 Repeat Test   Glucose, capillary     Status: Abnormal   Collection Time: 03/06/15  4:21 PM  Result Value Ref Range   Glucose-Capillary 111 (H) 70 - 99 mg/dL   Comment 1 Notify RN   Glucose, capillary     Status: None   Collection Time: 03/06/15 11:58 PM  Result Value Ref Range   Glucose-Capillary 83 70 - 99 mg/dL  Glucose, capillary     Status: None   Collection Time: 03/07/15  6:12 AM  Result Value Ref Range   Glucose-Capillary 88 70 - 99 mg/dL  CBC with Differential/Platelet     Status: Abnormal   Collection Time: 03/07/15  7:08 AM  Result Value Ref Range   WBC 8.2 4.0 - 10.5 K/uL   RBC 2.51 (L) 4.22 - 5.81 MIL/uL   Hemoglobin 7.8 (L) 13.0 - 17.0 g/dL   HCT 23.8 (L) 39.0 - 52.0  %   MCV 94.8 78.0 - 100.0 fL   MCH 31.1 26.0 - 34.0 pg   MCHC 32.8 30.0 - 36.0 g/dL   RDW 17.8 (H) 11.5 - 15.5 %   Platelets 87 (L) 150 - 400 K/uL    Comment: SPECIMEN CHECKED FOR CLOTS PLATELET COUNT CONFIRMED BY SMEAR    Neutrophils Relative % 67 43 - 77 %   Neutro Abs 5.5 1.7 - 7.7 K/uL   Lymphocytes Relative 12 12 - 46 %   Lymphs Abs 1.0 0.7 - 4.0 K/uL   Monocytes Relative 10 3 - 12 %   Monocytes Absolute 0.8 0.1 - 1.0 K/uL   Eosinophils Relative 10 (H) 0 - 5 %   Eosinophils Absolute 0.8 (H) 0.0 - 0.7 K/uL   Basophils Relative 1 0 - 1 %   Basophils Absolute 0.1 0.0 - 0.1 K/uL   Smear Review PLATELETS APPEAR DECREASED   KOH prep     Status: None   Collection Time: 03/07/15 12:25 PM  Result Value Ref Range   Specimen Description ESOPHAGUS    Special Requests NONE    KOH Prep NO YEAST OR FUNGAL ELEMENTS SEEN    Report Status 03/07/2015 FINAL   Glucose, capillary     Status: Abnormal   Collection Time: 03/07/15 12:41 PM  Result  Value Ref Range   Glucose-Capillary 117 (H) 70 - 99 mg/dL   Comment 1 Notify RN    Comment 2 Document in Chart   Glucose, capillary     Status: None   Collection Time: 03/07/15  6:49 PM  Result Value Ref Range   Glucose-Capillary 94 70 - 99 mg/dL  Glucose, capillary     Status: Abnormal   Collection Time: 03/07/15 11:39 PM  Result Value Ref Range   Glucose-Capillary 105 (H) 70 - 99 mg/dL  Glucose, capillary     Status: None   Collection Time: 03/08/15  5:56 AM  Result Value Ref Range   Glucose-Capillary 87 70 - 99 mg/dL  CBC with Differential/Platelet     Status: Abnormal   Collection Time: 03/08/15  6:45 AM  Result Value Ref Range   WBC 8.4 4.0 - 10.5 K/uL   RBC 2.41 (L) 4.22 - 5.81 MIL/uL   Hemoglobin 7.5 (L) 13.0 - 17.0 g/dL   HCT 23.1 (L) 39.0 - 52.0 %   MCV 95.9 78.0 - 100.0 fL   MCH 31.1 26.0 - 34.0 pg   MCHC 32.5 30.0 - 36.0 g/dL   RDW 18.0 (H) 11.5 - 15.5 %   Platelets 95 (L) 150 - 400 K/uL    Comment: SPECIMEN CHECKED FOR  CLOTS CONSISTENT WITH PREVIOUS RESULT    Neutrophils Relative % 72 43 - 77 %   Neutro Abs 6.0 1.7 - 7.7 K/uL   Lymphocytes Relative 11 (L) 12 - 46 %   Lymphs Abs 0.9 0.7 - 4.0 K/uL   Monocytes Relative 7 3 - 12 %   Monocytes Absolute 0.6 0.1 - 1.0 K/uL   Eosinophils Relative 9 (H) 0 - 5 %   Eosinophils Absolute 0.8 (H) 0.0 - 0.7 K/uL   Basophils Relative 1 0 - 1 %   Basophils Absolute 0.1 0.0 - 0.1 K/uL    ABGS No results for input(s): PHART, PO2ART, TCO2, HCO3 in the last 72 hours.  Invalid input(s): PCO2 CULTURES Recent Results (from the past 240 hour(s))  MRSA PCR Screening     Status: Abnormal   Collection Time: 03/03/15 12:30 AM  Result Value Ref Range Status   MRSA by PCR POSITIVE (A) NEGATIVE Final    Comment:        The GeneXpert MRSA Assay (FDA approved for NASAL specimens only), is one component of a comprehensive MRSA colonization surveillance program. It is not intended to diagnose MRSA infection nor to guide or monitor treatment for MRSA infections. RESULT CALLED TO, READ BACK BY AND VERIFIED WITH:  LEE,B @ 0308 ON 03/03/15 BY WOODIE,J   KOH prep     Status: None   Collection Time: 03/03/15 10:00 AM  Result Value Ref Range Status   Specimen Description BRONCH BRUSH TIP  Final   Special Requests NONE  Final   KOH Prep   Final    NO YEAST OR FUNGAL ELEMENTS SEEN Performed at [facility]    Report Status 03/03/2015 FINAL  Final  KOH prep     Status: None   Collection Time: 03/07/15 12:25 PM  Result Value Ref Range Status   Specimen Description ESOPHAGUS  Final   Special Requests NONE  Final   KOH Prep NO YEAST OR FUNGAL ELEMENTS SEEN  Final   Report Status 03/07/2015 FINAL  Final   Studies/Results: Dg Chest Port 1 View  03/06/2015   CLINICAL DATA:  PICC line placement  EXAM: PORTABLE CHEST - 1  VIEW  COMPARISON:  03/02/2015  FINDINGS: Right PICC line is in place with the tip near the cavoatrial junction in the lower SVC. Cardiomegaly with  vascular congestion. No overt edema. Minimal bibasilar atelectasis. No effusions. No acute bony abnormality.  IMPRESSION: Right PICC line tip in the lower SVC near the cavoatrial junction.  Cardiomegaly, vascular congestion.   Electronically Signed   By: Rolm Baptise M.D.   On: 03/06/2015 17:10    Medications: I have reviewed the patient's current medications.  Assesment:   Principal Problem:   Upper GI bleed Active Problems:   Hypertension   Hypothyroid   Panhypopituitarism   Acute blood loss anemia   GI bleed   Bleeding gastrointestinal    Plan:  Medications reviewed Will transfuse 2 units PRBC Will monitor CBC As per GI plan    LOS: 6 days   Alaiah Lundy 03/08/2015, 8:17 AM

## 2015-03-08 NOTE — Progress Notes (Signed)
Subjective:  Reports black stool this morning. Did not show nursing staff. Denies lightheadedness, weakness, abdominal pain.   Objective: Vital signs in last 24 hours: Temp:  [98.2 F (36.8 C)-98.8 F (37.1 C)] 98.2 F (36.8 C) (03/31 0556) Pulse Rate:  [72-87] 77 (03/31 0556) Resp:  [10-21] 20 (03/31 0556) BP: (115-149)/(40-94) 122/41 mmHg (03/31 0556) SpO2:  [98 %-100 %] 100 % (03/31 0556) FiO2 (%):  [96 %] 96 % (03/30 1558) Weight:  [238 lb 6.4 oz (108.138 kg)] 238 lb 6.4 oz (108.138 kg) (03/31 0556) Last BM Date: 03/08/15 General:   Alert,  Well-developed, well-nourished, pleasant and cooperative in NAD Head:  Normocephalic and atraumatic. Eyes:  Sclera clear, no icterus.   Abdomen:  Soft, nontender and nondistended.  Normal bowel sounds, without guarding, and without rebound.   Extremities:  Without clubbing, deformity or edema. Neurologic:  Alert and  oriented x4;  grossly normal neurologically. Skin:  Intact without significant lesions or rashes. Psych:  Alert and cooperative. Normal mood and affect.  Intake/Output from previous day: 03/30 0701 - 03/31 0700 In: 0  Out: 1500 [Urine:1500] Intake/Output this shift:    Lab Results: CBC  Recent Labs  03/06/15 0430 03/07/15 0708 03/08/15 0645  WBC 7.7 8.2 8.4  HGB 7.9* 7.8* 7.5*  HCT 23.6* 23.8* 23.1*  MCV 94.0 94.8 95.9  PLT 89* 87* 95*   BMET  Recent Labs  03/06/15 0430  NA 141  K 4.1  CL 112  CO2 23  GLUCOSE 102*  BUN 55*  CREATININE 1.62*  CALCIUM 7.7*   H.pylori serologies negative.   LFTs Lab Results  Component Value Date   ALT 21 03/05/2015   AST 15 03/05/2015   ALKPHOS 26* 03/05/2015   BILITOT 0.4 03/05/2015     PT/INR Lab Results  Component Value Date   INR 1.29 03/02/2015   INR 1.50* 12/12/2014   INR 1.36 11/21/2014       Imaging Studies: Dg Chest 1 View  03/02/2015   CLINICAL DATA:  Syncope. Generalized abdominal pain, nausea, black loose stools for 2 days. History of GI  bleed.  EXAM: CHEST  1 VIEW  COMPARISON:  02/14/2015  FINDINGS: Cardiomegaly. Lungs are clear. No effusions. No acute bony abnormality. No edema.  IMPRESSION: Cardiomegaly.  No active disease.   Electronically Signed   By: Rolm Baptise M.D.   On: 03/02/2015 22:20   Dg Chest Port 1 View  03/06/2015   CLINICAL DATA:  PICC line placement  EXAM: PORTABLE CHEST - 1 VIEW  COMPARISON:  03/02/2015  FINDINGS: Right PICC line is in place with the tip near the cavoatrial junction in the lower SVC. Cardiomegaly with vascular congestion. No overt edema. Minimal bibasilar atelectasis. No effusions. No acute bony abnormality.  IMPRESSION: Right PICC line tip in the lower SVC near the cavoatrial junction.  Cardiomegaly, vascular congestion.   Electronically Signed   By: Rolm Baptise M.D.   On: 03/06/2015 17:10   Dg Chest Port 1 View  02/14/2015   CLINICAL DATA:  Shortness of breath, cough  EXAM: PORTABLE CHEST - 1 VIEW  COMPARISON:  01/13/2015  FINDINGS: Cardiomegaly again noted. No acute infiltrate or pleural effusion. No pulmonary edema. Mild degenerative changes thoracic spine.  IMPRESSION: Cardiomegaly.  No active disease.   Electronically Signed   By: Lahoma Crocker M.D.   On: 02/14/2015 16:26  [2 weeks]   Assessment: 69 y/o male admitted with melena and acute blood loss anemia (Hgb 6.4), history of hematochezia  in the past, with EGD negative for candida esophagitis, noted pyloric channel and post bulbar duodenitis but without obvious bleeding lesions. Colonoscopy with pooling of dark burgundy blood noted throughout colon but without obvious bleeding lesion. Capsule study showed active bleeding at 2 hours into the SB.   6 units of prbcs this admission.  EGD 03/07/2015 showed probable candida esophagitis, mild gastritis, source for GI bleed not seen.KOH negative X 2 (both EGDs) this admission.   H.pylori serologies negative.   Reports black stool today. Hgb is stable however.   Thrombocytopenia since admission  likely related to GI bleeding/consumption. Continue to monitor. Improved today.  Plan: 1. Agree with additional transfusion of prbcs. 2. Monitor for recurrent bleeding. Repeat CBC in AM. 3. Restart ASA  In 2 weeks.   4. If rebleeds, he will need antegrade double balloon enteroscopy at [facility]. 5. Daily PPI.  Laureen Ochs. Bernarda Caffey [facility] [phone] 3/31/201611:05 AM     LOS: 6 days

## 2015-03-09 ENCOUNTER — Encounter (HOSPITAL_COMMUNITY): Payer: Self-pay | Admitting: Gastroenterology

## 2015-03-09 LAB — CBC
HCT: 26.6 % — ABNORMAL LOW (ref 39.0–52.0)
Hemoglobin: 8.8 g/dL — ABNORMAL LOW (ref 13.0–17.0)
MCH: 30.8 pg (ref 26.0–34.0)
MCHC: 33.1 g/dL (ref 30.0–36.0)
MCV: 93 fL (ref 78.0–100.0)
PLATELETS: 84 10*3/uL — AB (ref 150–400)
RBC: 2.86 MIL/uL — ABNORMAL LOW (ref 4.22–5.81)
RDW: 17.6 % — AB (ref 11.5–15.5)
WBC: 7.6 10*3/uL (ref 4.0–10.5)

## 2015-03-09 LAB — TYPE AND SCREEN
ABO/RH(D): B POS
ANTIBODY SCREEN: NEGATIVE
UNIT DIVISION: 0
Unit division: 0

## 2015-03-09 LAB — GLUCOSE, CAPILLARY
GLUCOSE-CAPILLARY: 97 mg/dL (ref 70–99)
GLUCOSE-CAPILLARY: 106 mg/dL — AB (ref 70–99)
Glucose-Capillary: 110 mg/dL — ABNORMAL HIGH (ref 70–99)
Glucose-Capillary: 90 mg/dL (ref 70–99)

## 2015-03-09 MED ORDER — FUROSEMIDE 20 MG PO TABS
20.0000 mg | ORAL_TABLET | Freq: Every day | ORAL | Status: DC
  Administered 2015-03-09 – 2015-03-10 (×2): 20 mg via ORAL
  Filled 2015-03-09 (×2): qty 1

## 2015-03-09 MED ORDER — PREDNISONE 20 MG PO TABS
20.0000 mg | ORAL_TABLET | Freq: Every day | ORAL | Status: DC
  Administered 2015-03-09 – 2015-03-10 (×2): 20 mg via ORAL
  Filled 2015-03-09 (×2): qty 1

## 2015-03-09 NOTE — Progress Notes (Signed)
Adin for Dilantin IV Indication: h/o seizure disorder  No Known Allergies  Patient Measurements: Height: 6' 1"  (185.4 cm) Weight: 262 lb (118.842 kg) IBW/kg (Calculated) : 79.9  Vital Signs: Temp: 98 F (36.7 C) (04/01 0639) Temp Source: Oral (04/01 0639) BP: 124/56 mmHg (04/01 0639) Pulse Rate: 80 (04/01 0639) Intake/Output from previous day: 03/31 0701 - 04/01 0700 In: 3154 [P.O.:240; I.V.:100; Blood:695] Out: 600 [Urine:600] Intake/Output from this shift:    Labs:  Recent Labs  03/07/15 0708 03/08/15 0645 03/09/15 0020  WBC 8.2 8.4 7.6  HGB 7.8* 7.5* 8.8*  HCT 23.8* 23.1* 26.6*  PLT 87* 95* 84*   Estimated Creatinine Clearance: 57.3 mL/min (by C-G formula based on Cr of 1.62).  Microbiology: Recent Results (from the past 720 hour(s))  MRSA PCR Screening     Status: Abnormal   Collection Time: 02/15/15  2:40 AM  Result Value Ref Range Status   MRSA by PCR POSITIVE (A) NEGATIVE Final    Comment:        The GeneXpert MRSA Assay (FDA approved for NASAL specimens only), is one component of a comprehensive MRSA colonization surveillance program. It is not intended to diagnose MRSA infection nor to guide or monitor treatment for MRSA infections. RESULT CALLED TO, READ BACK BY AND VERIFIED WITH: WOODS K AT 0414 ON [id number] BY FORSYTH K   MRSA PCR Screening     Status: Abnormal   Collection Time: 03/03/15 12:30 AM  Result Value Ref Range Status   MRSA by PCR POSITIVE (A) NEGATIVE Final    Comment:        The GeneXpert MRSA Assay (FDA approved for NASAL specimens only), is one component of a comprehensive MRSA colonization surveillance program. It is not intended to diagnose MRSA infection nor to guide or monitor treatment for MRSA infections. RESULT CALLED TO, READ BACK BY AND VERIFIED WITH:  LEE,B @ 0308 ON 03/03/15 BY WOODIE,J   KOH prep     Status: None   Collection Time: 03/03/15 10:00 AM  Result  Value Ref Range Status   Specimen Description BRONCH BRUSH TIP  Final   Special Requests NONE  Final   KOH Prep   Final    NO YEAST OR FUNGAL ELEMENTS SEEN Performed at [facility]    Report Status 03/03/2015 FINAL  Final  KOH prep     Status: None   Collection Time: 03/07/15 12:25 PM  Result Value Ref Range Status   Specimen Description ESOPHAGUS  Final   Special Requests NONE  Final   KOH Prep NO YEAST OR FUNGAL ELEMENTS SEEN  Final   Report Status 03/07/2015 FINAL  Final   Medical History: Past Medical History  Diagnosis Date  . Chronic obstructive pulmonary disease     Chronic bronchitis;  home oxygen; multiple exacerbations  . Hypertension   . Cellulitis of lower leg   . Sleep apnea     Severe on a sleep study in 12/2010  . Hypothyroid     10/2002: TSH-0.43, T4-0.77  . Tobacco abuse, in remission     20 pack years; discontinued 1998  . Hyperlipidemia     No lipid profile available  . 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 Morbid obesity with BMI of 50.0-59.9, adult 10/28/2012  . Seizure disorder     Onset after craniotomy  . Aortic valve disease 10/2012  Prominent diastolic murmur; mild to moderate aortic insufficiency on echocardiogram in 10/2012  . Chronic kidney disease     S/P right nephrectomy for hypernephroma in 2010  . Seizures   . CHF (congestive heart failure)   . Diabetes mellitus without complication    Medications:  Prescriptions prior to admission  Medication Sig Dispense Refill Last Dose  . albuterol (PROVENTIL) (2.5 MG/3ML) 0.083% nebulizer solution Take 3 mLs (2.5 mg total) by nebulization every 4 (four) hours as needed for wheezing or shortness of breath. 75 mL 12 Past Month at Unknown time  . aspirin EC 325 MG EC tablet Take 1 tablet (325 mg total) by mouth daily. 30 tablet 0 03/02/2015 at Unknown time  . budesonide-formoterol  (SYMBICORT) 160-4.5 MCG/ACT inhaler Inhale 2 puffs into the lungs 2 (two) times daily.   03/02/2015 at Unknown time  . camphor-menthol (SARNA) lotion Apply topically as needed for itching. (Patient taking differently: Apply 1 application topically as needed for itching. ) 222 mL 0 unknown at Unknown time  . cloNIDine (CATAPRES) 0.2 MG tablet Take 1 tablet (0.2 mg total) by mouth 3 (three) times daily. 90 tablet 3 03/02/2015 at Unknown time  . desmopressin (DDAVP) 0.2 MG tablet Take 1 tablet (0.2 mg total) by mouth 2 (two) times daily. 60 tablet 3 03/02/2015 at Unknown time  . dexamethasone (DECADRON) 0.5 MG tablet Take 0.5 mg by mouth daily.   03/02/2015 at Unknown time  . furosemide (LASIX) 20 MG tablet Take 20 mg by mouth daily.   03/02/2015 at Unknown time  . furosemide (LASIX) 80 MG tablet Take 80 mg by mouth daily.   03/02/2015 at Unknown time  . HYDROcodone-acetaminophen (NORCO/VICODIN) 5-325 MG per tablet Take two tablets by mouth every 6 hours as needed for pain. Max APAP 3gm/24hr from all sources (Patient taking differently: Take 2 tablets by mouth every 6 (six) hours as needed. Take two tablets by mouth every 6 hours as needed for pain. Max APAP 3gm/24hr from all sources) 240 tablet 0 unknown  . ipratropium (ATROVENT) 0.02 % nebulizer solution Take 0.5 mg by nebulization 4 (four) times daily.   Past Week at Unknown time  . levothyroxine (SYNTHROID, LEVOTHROID) 175 MCG tablet Take 175 mcg by mouth daily before breakfast.   03/02/2015 at Unknown time  . loratadine (CLARITIN) 10 MG tablet Take 10 mg by mouth daily.    Past Month at Unknown time  . metolazone (ZAROXOLYN) 5 MG tablet Take 5 mg by mouth daily.   03/02/2015 at Unknown time  . mometasone (NASONEX) 50 MCG/ACT nasal spray Place 2 sprays into the nose daily. 17 g 12 03/02/2015 at Unknown time  . omeprazole (PRILOSEC) 20 MG capsule Take 20 mg by mouth daily.   03/02/2015 at Unknown time  . phenytoin (DILANTIN) 100 MG ER capsule Take 200-300 mg by  mouth daily. Takes 200 mg on Monday and Thursdsay. On all other days of the week takes 300 mg.   03/02/2015 at Unknown time  . polyethylene glycol powder (GLYCOLAX/MIRALAX) powder Take 17 g by mouth daily. May decrease to 17 g three times a week if needed. (Patient taking differently: Take 17 g by mouth daily. May decrease to 17 g three times a week if needed.) 255 g 3 Past Week at Unknown time  . potassium chloride SA (K-DUR,KLOR-CON) 20 MEQ tablet Take 20 mEq by mouth 2 (two) times daily.   03/02/2015 at Unknown time  . predniSONE (DELTASONE) 10 MG tablet Take 10-40 mg by mouth  See admin instructions. Starting on 02/21/2015, take 4 tablets daily for 3 days, then take 3 tablets daily for 3 days, then take 2 tablets daily for 3 days, then take 1 tablet daily for 3 days.   03/02/2015 at Unknown time  . simvastatin (ZOCOR) 80 MG tablet Take 80 mg by mouth daily.    03/02/2015 at Unknown time  . sodium bicarbonate 650 MG tablet Take 1 tablet (650 mg total) by mouth 2 (two) times daily. (Patient taking differently: Take 650 mg by mouth daily. )   03/02/2015 at Unknown time  . torsemide (DEMADEX) 20 MG tablet Take 2 tablets (40 mg total) by mouth daily. 30 tablet 3 03/02/2015 at Unknown time  . hydrocortisone (PROCTOSOL HC) 2.5 % rectal cream Place 1 application rectally 2 (two) times daily. Do not use for longer than 7 days at a time. (Patient not taking: Reported on 03/02/2015) 30 g 0 unknown  . insulin aspart (NOVOLOG) 100 UNIT/ML injection Inject 0-9 Units into the skin 3 (three) times daily with meals. (Patient not taking: Reported on 01/24/2015) 10 mL 11 Not Taking  . ondansetron (ZOFRAN) 4 MG tablet Take 1 tablet (4 mg total) by mouth every 8 (eight) hours as needed for nausea or vomiting. (Patient not taking: Reported on 03/02/2015) 20 tablet 0 unknown  . peg 3350 powder (MOVIPREP) 100 G SOLR Take 1 kit (200 g total) by mouth as directed. 1 kit 0 unknown   Assessment: 69yo male w/ h/o COPD, CHF, CKD, and  seizure disorder.  Admitted with GIB. No seizures reported this admission.  Pt on Dilantin PTA (home dose listed above).   Phenytoin level and albumin were low on admission, but trending up toward desired goal.  Increased dose of 354m/day was given IV while patient NPO, but home oral dose was resumed 3/29.    3/28: Measured Dilantin level = 5.1           Albumin = 2.6          Corrected Dilantin level = 8.2  Goal of Therapy:  Corrected Dilantin level 10-20  Plan:  Continue Dilantin at home dose of 3019mdaily except 20065mn Mon/Thurs Recommend routine monitoring of Dilantin level and Albumin  Pharmacy to sign off.  Please re-consult as needed.  LilBiagio Borg1/2016,8:28 AM

## 2015-03-09 NOTE — Progress Notes (Signed)
[name] [name] BM this am was formed brown to dark brown No bright red blood noted

## 2015-03-09 NOTE — Progress Notes (Signed)
Subjective: Patient feels better. He was transfused 2 units of PRBC. He had dark stool. Objective: Vital signs in last 24 hours: Temp:  [98 F (36.7 C)-98.8 F (37.1 C)] 98 F (36.7 C) (04/01 0639) Pulse Rate:  [74-93] 80 (04/01 0639) Resp:  [18-20] 18 (04/01 0639) BP: (118-135)/(41-56) 124/56 mmHg (04/01 0639) SpO2:  [96 %-100 %] 98 % (04/01 0702) Weight:  [118.842 kg (262 lb)] 118.842 kg (262 lb) (04/01 0647) Weight change: 10.705 kg (23 lb 9.6 oz) Last BM Date: 03/08/15  Intake/Output from previous day: 03/31 0701 - 04/01 0700 In: 1035 [P.O.:240; I.V.:100; Blood:695] Out: 600 [Urine:600]  PHYSICAL EXAM General appearance: alert and no distress Resp: diminished breath sounds bilaterally and rhonchi bilaterally Cardio: S1, S2 normal GI: soft, non-tender; bowel sounds normal; no masses,  no organomegaly Extremities: extremities normal, atraumatic, no cyanosis or edema  Lab Results:  Results for orders placed or performed during the hospital encounter of 03/02/15 (from the past 48 hour(s))  KOH prep     Status: None   Collection Time: 03/07/15 12:25 PM  Result Value Ref Range   Specimen Description ESOPHAGUS    Special Requests NONE    KOH Prep NO YEAST OR FUNGAL ELEMENTS SEEN    Report Status 03/07/2015 FINAL   Glucose, capillary     Status: Abnormal   Collection Time: 03/07/15 12:41 PM  Result Value Ref Range   Glucose-Capillary 117 (H) 70 - 99 mg/dL   Comment 1 Notify RN    Comment 2 Document in Chart   Glucose, capillary     Status: None   Collection Time: 03/07/15  6:49 PM  Result Value Ref Range   Glucose-Capillary 94 70 - 99 mg/dL  Glucose, capillary     Status: Abnormal   Collection Time: 03/07/15 11:39 PM  Result Value Ref Range   Glucose-Capillary 105 (H) 70 - 99 mg/dL  Glucose, capillary     Status: None   Collection Time: 03/08/15  5:56 AM  Result Value Ref Range   Glucose-Capillary 87 70 - 99 mg/dL  CBC with Differential/Platelet     Status:  Abnormal   Collection Time: 03/08/15  6:45 AM  Result Value Ref Range   WBC 8.4 4.0 - 10.5 K/uL   RBC 2.41 (L) 4.22 - 5.81 MIL/uL   Hemoglobin 7.5 (L) 13.0 - 17.0 g/dL   HCT 23.1 (L) 39.0 - 52.0 %   MCV 95.9 78.0 - 100.0 fL   MCH 31.1 26.0 - 34.0 pg   MCHC 32.5 30.0 - 36.0 g/dL   RDW 18.0 (H) 11.5 - 15.5 %   Platelets 95 (L) 150 - 400 K/uL    Comment: SPECIMEN CHECKED FOR CLOTS CONSISTENT WITH PREVIOUS RESULT    Neutrophils Relative % 72 43 - 77 %   Neutro Abs 6.0 1.7 - 7.7 K/uL   Lymphocytes Relative 11 (L) 12 - 46 %   Lymphs Abs 0.9 0.7 - 4.0 K/uL   Monocytes Relative 7 3 - 12 %   Monocytes Absolute 0.6 0.1 - 1.0 K/uL   Eosinophils Relative 9 (H) 0 - 5 %   Eosinophils Absolute 0.8 (H) 0.0 - 0.7 K/uL   Basophils Relative 1 0 - 1 %   Basophils Absolute 0.1 0.0 - 0.1 K/uL  Prepare RBC     Status: None   Collection Time: 03/08/15 10:44 AM  Result Value Ref Range   Order Confirmation ORDER PROCESSED BY BLOOD BANK   Type and screen  Status: None (Preliminary result)   Collection Time: 03/08/15 10:44 AM  Result Value Ref Range   ABO/RH(D) B POS    Antibody Screen NEG    Sample Expiration 03/11/2015    Unit Number [id number]    Blood Component Type RED CELLS,LR    Unit division 00    Status of Unit ISSUED    Transfusion Status OK TO TRANSFUSE    Crossmatch Result Compatible    Unit Number [id number]    Blood Component Type RED CELLS,LR    Unit division 00    Status of Unit ISSUED    Transfusion Status OK TO TRANSFUSE    Crossmatch Result Compatible   Glucose, capillary     Status: Abnormal   Collection Time: 03/08/15 11:27 AM  Result Value Ref Range   Glucose-Capillary 116 (H) 70 - 99 mg/dL   Comment 1 Notify RN   CBC     Status: Abnormal   Collection Time: 03/09/15 12:20 AM  Result Value Ref Range   WBC 7.6 4.0 - 10.5 K/uL   RBC 2.86 (L) 4.22 - 5.81 MIL/uL   Hemoglobin 8.8 (L) 13.0 - 17.0 g/dL   HCT 26.6 (L) 39.0 - 52.0 %   MCV 93.0 78.0 - 100.0 fL    MCH 30.8 26.0 - 34.0 pg   MCHC 33.1 30.0 - 36.0 g/dL   RDW 17.6 (H) 11.5 - 15.5 %   Platelets 84 (L) 150 - 400 K/uL    Comment: SPECIMEN CHECKED FOR CLOTS CONSISTENT WITH PREVIOUS RESULT   Glucose, capillary     Status: None   Collection Time: 03/09/15 12:30 AM  Result Value Ref Range   Glucose-Capillary 90 70 - 99 mg/dL  Glucose, capillary     Status: None   Collection Time: 03/09/15  6:36 AM  Result Value Ref Range   Glucose-Capillary 97 70 - 99 mg/dL    ABGS No results for input(s): PHART, PO2ART, TCO2, HCO3 in the last 72 hours.  Invalid input(s): PCO2 CULTURES Recent Results (from the past 240 hour(s))  MRSA PCR Screening     Status: Abnormal   Collection Time: 03/03/15 12:30 AM  Result Value Ref Range Status   MRSA by PCR POSITIVE (A) NEGATIVE Final    Comment:        The GeneXpert MRSA Assay (FDA approved for NASAL specimens only), is one component of a comprehensive MRSA colonization surveillance program. It is not intended to diagnose MRSA infection nor to guide or monitor treatment for MRSA infections. RESULT CALLED TO, READ BACK BY AND VERIFIED WITH:  LEE,B @ 0308 ON 03/03/15 BY WOODIE,J   KOH prep     Status: None   Collection Time: 03/03/15 10:00 AM  Result Value Ref Range Status   Specimen Description BRONCH BRUSH TIP  Final   Special Requests NONE  Final   KOH Prep   Final    NO YEAST OR FUNGAL ELEMENTS SEEN Performed at [facility]    Report Status 03/03/2015 FINAL  Final  KOH prep     Status: None   Collection Time: 03/07/15 12:25 PM  Result Value Ref Range Status   Specimen Description ESOPHAGUS  Final   Special Requests NONE  Final   KOH Prep NO YEAST OR FUNGAL ELEMENTS SEEN  Final   Report Status 03/07/2015 FINAL  Final   Studies/Results: No results found.  Medications: I have reviewed the patient's current medications.  Assesment:   Principal Problem:   Upper GI bleed  Active Problems:   Hypertension   Hypothyroid    Panhypopituitarism   Acute blood loss anemia   GI bleed   Bleeding gastrointestinal   Thrombocytopenia due to blood loss    Plan:  Medications reviewed Will monitor CBC As per GI plan Continue regular treatment    LOS: 7 days   [name] [name] 03/09/2015, 8:13 AM

## 2015-03-09 NOTE — Progress Notes (Signed)
    Subjective: Doing well, denies abdominal pain, lightheadedness, dizziness, chest pain, shortness of breath. Had a large bowel movement this morning which per nursing was formed and dark brown with some possible darker spots, no red blood noted.   Objective: Vital signs in last 24 hours: Temp:  [98 F (36.7 C)-98.8 F (37.1 C)] 98 F (36.7 C) (04/01 0639) Pulse Rate:  [74-93] 80 (04/01 0639) Resp:  [18-20] 18 (04/01 0639) BP: (118-135)/(41-56) 124/56 mmHg (04/01 0639) SpO2:  [96 %-100 %] 98 % (04/01 0702) Weight:  [262 lb (118.842 kg)] 262 lb (118.842 kg) (04/01 0647) Last BM Date: 03/09/15 General:   Alert and oriented, pleasant. Sitting in the bedside chair. Head:  Normocephalic and atraumatic. Eyes:  No icterus, sclera clear. Conjuctiva pink.  Heart:  S1, S2 present, mild systolic murmur noted.  Lungs: Clear to auscultation bilaterally, without wheezing, rales, or rhonchi.  Abdomen:  Rounded, bowel sounds present, soft, non-tender, non-distended. No HSM or hernias noted. No rebound or guarding. No masses appreciated  Neurologic:  Alert and  oriented x4;  grossly normal neurologically. Skin:  Warm and dry, intact without significant lesions.  Psych:  Alert and cooperative. Normal mood and affect.  Intake/Output from previous day: 03/31 0701 - 04/01 0700 In: 1035 [P.O.:240; I.V.:100; Blood:695] Out: 600 [Urine:600] Intake/Output this shift:    Lab Results:  Recent Labs  03/07/15 0708 03/08/15 0645 03/09/15 0020  WBC 8.2 8.4 7.6  HGB 7.8* 7.5* 8.8*  HCT 23.8* 23.1* 26.6*  PLT 87* 95* 84*   BMET No results for input(s): NA, K, CL, CO2, GLUCOSE, BUN, CREATININE, CALCIUM in the last 72 hours. LFT No results for input(s): PROT, ALBUMIN, AST, ALT, ALKPHOS, BILITOT, BILIDIR, IBILI in the last 72 hours. PT/INR No results for input(s): LABPROT, INR in the last 72 hours. Hepatitis Panel No results for input(s): HEPBSAG, HCVAB, HEPAIGM, HEPBIGM in the last 72  hours.   Studies/Results: No results found.  Assessment: 69 y/o male admitted with melena and acute blood loss anemia (Hgb 6.4), history of hematochezia in the past, with EGD negative for candida esophagitis, noted pyloric channel and post bulbar duodenitis but without obvious bleeding lesions. Colonoscopy with pooling of dark burgundy blood noted throughout colon but without obvious bleeding lesion. Capsule study showed active bleeding at 2 hours into the SB.   6 units of prbcs this admission.  EGD 03/07/2015 showed probable candida esophagitis, mild gastritis, source for GI bleed not seen.KOH negative X 2 (both EGDs) this admission.   H.pylori serologies negative.   Large formed dark brown bowel movement today. Per nursing possible black/darker brown areas on the stool, but not tarry in consistency. H/H has improved from 7.5/23.1 yesterday to 8.8/26.6 today. Last PRBC per flowsheet last night 2110, schedule CBC draw this afternoon.   Plan: 1. Continue to monitor for GI bleed. 2. Follow H/H closely, next draw scheduled for this afternoon. Repeat in the morning. 3. If continued/recurrent GI bleeding will likely need transfer to [facility] for furhter evaluation 4. Continue supportive measures.   Walden Field, AGNP-C Adult & Gerontological Nurse Practitioner [facility]    LOS: 7 days    03/09/2015, 1:19 PM

## 2015-03-10 ENCOUNTER — Telehealth: Payer: Self-pay | Admitting: Gastroenterology

## 2015-03-10 LAB — GLUCOSE, CAPILLARY
Glucose-Capillary: 77 mg/dL (ref 70–99)
Glucose-Capillary: 89 mg/dL (ref 70–99)

## 2015-03-10 LAB — CBC
HEMATOCRIT: 28.1 % — AB (ref 39.0–52.0)
HEMATOCRIT: 28.6 % — AB (ref 39.0–52.0)
HEMOGLOBIN: 9.2 g/dL — AB (ref 13.0–17.0)
HEMOGLOBIN: 9.3 g/dL — AB (ref 13.0–17.0)
MCH: 31 pg (ref 26.0–34.0)
MCH: 30.8 pg (ref 26.0–34.0)
MCHC: 32.7 g/dL (ref 30.0–36.0)
MCHC: 32.5 g/dL (ref 30.0–36.0)
MCV: 94.6 fL (ref 78.0–100.0)
MCV: 94.7 fL (ref 78.0–100.0)
Platelets: 94 10*3/uL — ABNORMAL LOW (ref 150–400)
Platelets: 103 10*3/uL — ABNORMAL LOW (ref 150–400)
RBC: 2.97 MIL/uL — ABNORMAL LOW (ref 4.22–5.81)
RBC: 3.02 MIL/uL — AB (ref 4.22–5.81)
RDW: 17.7 % — AB (ref 11.5–15.5)
RDW: 17.7 % — AB (ref 11.5–15.5)
WBC: 7.7 10*3/uL (ref 4.0–10.5)
WBC: 8.7 10*3/uL (ref 4.0–10.5)

## 2015-03-10 LAB — BASIC METABOLIC PANEL
ANION GAP: 3 — AB (ref 5–15)
BUN: 36 mg/dL — ABNORMAL HIGH (ref 6–23)
CALCIUM: 7.7 mg/dL — AB (ref 8.4–10.5)
CO2: 21 mmol/L (ref 19–32)
CREATININE: 1.69 mg/dL — AB (ref 0.50–1.35)
Chloride: 113 mmol/L — ABNORMAL HIGH (ref 96–112)
GFR, EST AFRICAN AMERICAN: 46 mL/min — AB (ref >90)
GFR, EST NON AFRICAN AMERICAN: 39 mL/min — AB (ref >90)
Glucose, Bld: 101 mg/dL — ABNORMAL HIGH (ref 70–99)
Potassium: 4.8 mmol/L (ref 3.5–5.1)
SODIUM: 137 mmol/L (ref 135–145)

## 2015-03-10 MED ORDER — PANTOPRAZOLE SODIUM 40 MG PO TBEC: 40.0000 mg | DELAYED_RELEASE_TABLET | Freq: Two times a day (BID) | ORAL | Status: DC

## 2015-03-10 NOTE — Plan of Care (Signed)
Problem: Discharge Progression Outcomes Goal: Hemodynamically stable Outcome: Completed/Met Date Met:  03/10/15 No bleeding H/H stable Goal: Home Health Care arrangements in place Outcome: Completed/Met Date Met:  03/10/15 Advance home care Goal: Other Discharge Outcomes/Goals Outcome: Completed/Met Date Met:  03/10/15 Reviewed CHF instructions with pt and spouse

## 2015-03-10 NOTE — Telephone Encounter (Signed)
OPV IN 2 MOS WITH DR. Gala Romney E30 OBSCURE GI BLEED/MELENA.

## 2015-03-10 NOTE — Discharge Summary (Signed)
Physician Discharge Summary  Patient ID: [name] [name] MRN: [id number] DOB/AGE: [birth year] 69 y.o. Primary Care Physician:Joanie Duprey, MD Admit date: 03/02/2015 Discharge date: 03/10/2015    Discharge Diagnoses:   Principal Problem:   Upper GI bleed Active Problems:   Hypertension   Hypothyroid   Panhypopituitarism   Acute blood loss anemia   GI bleed   Bleeding gastrointestinal   Thrombocytopenia due to blood loss     Medication List    STOP taking these medications        aspirin 325 MG EC tablet     metolazone 5 MG tablet  Commonly known as:  ZAROXOLYN     omeprazole 20 MG capsule  Commonly known as:  PRILOSEC     torsemide 20 MG tablet  Commonly known as:  DEMADEX      TAKE these medications        albuterol (2.5 MG/3ML) 0.083% nebulizer solution  Commonly known as:  PROVENTIL  Take 3 mLs (2.5 mg total) by nebulization every 4 (four) hours as needed for wheezing or shortness of breath.     budesonide-formoterol 160-4.5 MCG/ACT inhaler  Commonly known as:  SYMBICORT  Inhale 2 puffs into the lungs 2 (two) times daily.     camphor-menthol lotion  Commonly known as:  SARNA  Apply topically as needed for itching.     cloNIDine 0.2 MG tablet  Commonly known as:  CATAPRES  Take 1 tablet (0.2 mg total) by mouth 3 (three) times daily.     desmopressin 0.2 MG tablet  Commonly known as:  DDAVP  Take 1 tablet (0.2 mg total) by mouth 2 (two) times daily.     dexamethasone 0.5 MG tablet  Commonly known as:  DECADRON  Take 0.5 mg by mouth daily.     furosemide 80 MG tablet  Commonly known as:  LASIX  Take 80 mg by mouth daily.     HYDROcodone-acetaminophen 5-325 MG per tablet  Commonly known as:  NORCO/VICODIN  Take two tablets by mouth every 6 hours as needed for pain. Max APAP 3gm/24hr from all sources     hydrocortisone 2.5 % rectal cream  Commonly known as:  PROCTOSOL HC  Place 1 application rectally 2 (two) times daily. Do not use for longer than 7  days at a time.     insulin aspart 100 UNIT/ML injection  Commonly known as:  novoLOG  Inject 0-9 Units into the skin 3 (three) times daily with meals.     ipratropium 0.02 % nebulizer solution  Commonly known as:  ATROVENT  Take 0.5 mg by nebulization 4 (four) times daily.     levothyroxine 175 MCG tablet  Commonly known as:  SYNTHROID, LEVOTHROID  Take 175 mcg by mouth daily before breakfast.     loratadine 10 MG tablet  Commonly known as:  CLARITIN  Take 10 mg by mouth daily.     mometasone 50 MCG/ACT nasal spray  Commonly known as:  NASONEX  Place 2 sprays into the nose daily.     ondansetron 4 MG tablet  Commonly known as:  ZOFRAN  Take 1 tablet (4 mg total) by mouth every 8 (eight) hours as needed for nausea or vomiting.     pantoprazole 40 MG tablet  Commonly known as:  PROTONIX  Take 1 tablet (40 mg total) by mouth 2 (two) times daily before a meal.     peg 3350 powder 100 G Solr  Commonly known as:  MOVIPREP  Take 1  kit (200 g total) by mouth as directed.     phenytoin 100 MG ER capsule  Commonly known as:  DILANTIN  Take 200-300 mg by mouth daily. Takes 200 mg on Monday and Thursdsay. On all other days of the week takes 300 mg.     polyethylene glycol powder powder  Commonly known as:  GLYCOLAX/MIRALAX  Take 17 g by mouth daily. May decrease to 17 g three times a week if needed.     potassium chloride SA 20 MEQ tablet  Commonly known as:  K-DUR,KLOR-CON  Take 20 mEq by mouth 2 (two) times daily.     predniSONE 10 MG tablet  Commonly known as:  DELTASONE  Take 10-40 mg by mouth See admin instructions. Starting on 02/21/2015, take 4 tablets daily for 3 days, then take 3 tablets daily for 3 days, then take 2 tablets daily for 3 days, then take 1 tablet daily for 3 days.     simvastatin 80 MG tablet  Commonly known as:  ZOCOR  Take 80 mg by mouth daily.     sodium bicarbonate 650 MG tablet  Take 1 tablet (650 mg total) by mouth 2 (two) times daily.         Discharged Condition: improved    Consults: GI  Significant Diagnostic Studies: Dg Chest 1 View  03/02/2015   CLINICAL DATA:  Syncope. Generalized abdominal pain, nausea, black loose stools for 2 days. History of GI bleed.  EXAM: CHEST  1 VIEW  COMPARISON:  02/14/2015  FINDINGS: Cardiomegaly. Lungs are clear. No effusions. No acute bony abnormality. No edema.  IMPRESSION: Cardiomegaly.  No active disease.   Electronically Signed   By: Rolm Baptise M.D.   On: 03/02/2015 22:20   Dg Chest Port 1 View  03/06/2015   CLINICAL DATA:  PICC line placement  EXAM: PORTABLE CHEST - 1 VIEW  COMPARISON:  03/02/2015  FINDINGS: Right PICC line is in place with the tip near the cavoatrial junction in the lower SVC. Cardiomegaly with vascular congestion. No overt edema. Minimal bibasilar atelectasis. No effusions. No acute bony abnormality.  IMPRESSION: Right PICC line tip in the lower SVC near the cavoatrial junction.  Cardiomegaly, vascular congestion.   Electronically Signed   By: Rolm Baptise M.D.   On: 03/06/2015 17:10   Dg Chest Port 1 View  02/14/2015   CLINICAL DATA:  Shortness of breath, cough  EXAM: PORTABLE CHEST - 1 VIEW  COMPARISON:  01/13/2015  FINDINGS: Cardiomegaly again noted. No acute infiltrate or pleural effusion. No pulmonary edema. Mild degenerative changes thoracic spine.  IMPRESSION: Cardiomegaly.  No active disease.   Electronically Signed   By: Lahoma Crocker M.D.   On: 02/14/2015 16:26    Lab Results: Basic Metabolic Panel:  Recent Labs  03/10/15 0102  NA 137  K 4.8  CL 113*  CO2 21  GLUCOSE 101*  BUN 36*  CREATININE 1.69*  CALCIUM 7.7*   Liver Function Tests: No results for input(s): AST, ALT, ALKPHOS, BILITOT, PROT, ALBUMIN in the last 72 hours.   CBC:  Recent Labs  03/08/15 0645  03/10/15 0102 03/10/15 0628  WBC 8.4  < > 7.7 8.7  NEUTROABS 6.0  --   --   --   HGB 7.5*  < > 9.2* 9.3*  HCT 23.1*  < > 28.1* 28.6*  MCV 95.9  < > 94.6 94.7  PLT 95*  < > 94*  103*  < > = values in this interval not  displayed.  Recent Results (from the past 240 hour(s))  MRSA PCR Screening     Status: Abnormal   Collection Time: 03/03/15 12:30 AM  Result Value Ref Range Status   MRSA by PCR POSITIVE (A) NEGATIVE Final    Comment:        The GeneXpert MRSA Assay (FDA approved for NASAL specimens only), is one component of a comprehensive MRSA colonization surveillance program. It is not intended to diagnose MRSA infection nor to guide or monitor treatment for MRSA infections. RESULT CALLED TO, READ BACK BY AND VERIFIED WITH:  LEE,B @ 0308 ON 03/03/15 BY WOODIE,J   KOH prep     Status: None   Collection Time: 03/03/15 10:00 AM  Result Value Ref Range Status   Specimen Description BRONCH BRUSH TIP  Final   Special Requests NONE  Final   KOH Prep   Final    NO YEAST OR FUNGAL ELEMENTS SEEN Performed at [facility]    Report Status 03/03/2015 FINAL  Final  KOH prep     Status: None   Collection Time: 03/07/15 12:25 PM  Result Value Ref Range Status   Specimen Description ESOPHAGUS  Final   Special Requests NONE  Final   KOH Prep NO YEAST OR FUNGAL ELEMENTS SEEN  Final   Report Status 03/07/2015 FINAL  Final     Hospital Course:   This is a 69 years old male patient with history of multiple medical illnesses was admitted due GI bleed. Patient was evaluated by GI and colonoscopy and EGD was done. His site of bleeding couldn't identified. Further study evaluation was by capsular endoscopy. However, after the study specific site of the bleeding couldn't identified. Patient transfused PRBC. He was started on BID Protonix. His rectal bleed has stopped and his H/H is stabilized. Patient is discharge home in stable condition to be followed in out patient.  Discharge Exam: Blood pressure 133/45, pulse 75, temperature 98.7 F (37.1 C), temperature source Oral, resp. rate 20, height 6' 1"  (1.854 m), weight 118.842 kg (262 lb), SpO2 97 %.     Disposition:  home        Follow-up Information    Follow up with [facility].   Contact information:   [address] [place] [address] [phone]       Signed: [name]   03/10/2015, 10:01 AM

## 2015-03-12 NOTE — Progress Notes (Signed)
UR chart review completed.  

## 2015-03-12 NOTE — Telephone Encounter (Signed)
PATIENT ON RMR SCHEDULE FOR 05/11/15

## 2015-03-27 ENCOUNTER — Encounter: Payer: Self-pay | Admitting: Internal Medicine

## 2015-04-05 ENCOUNTER — Encounter: Payer: Self-pay | Admitting: Cardiovascular Disease

## 2015-04-05 ENCOUNTER — Ambulatory Visit (INDEPENDENT_AMBULATORY_CARE_PROVIDER_SITE_OTHER): Payer: Medicare HMO | Admitting: Cardiovascular Disease

## 2015-04-05 VITALS — BP 124/52 | HR 88 | Ht 73.0 in | Wt 248.2 lb

## 2015-04-05 DIAGNOSIS — E785 Hyperlipidemia, unspecified: Secondary | ICD-10-CM | POA: Diagnosis not present

## 2015-04-05 DIAGNOSIS — I351 Nonrheumatic aortic (valve) insufficiency: Secondary | ICD-10-CM | POA: Diagnosis not present

## 2015-04-05 DIAGNOSIS — I10 Essential (primary) hypertension: Secondary | ICD-10-CM

## 2015-04-05 DIAGNOSIS — N184 Chronic kidney disease, stage 4 (severe): Secondary | ICD-10-CM | POA: Diagnosis not present

## 2015-04-05 DIAGNOSIS — Z6841 Body Mass Index (BMI) 40.0 and over, adult: Secondary | ICD-10-CM

## 2015-04-05 DIAGNOSIS — I5022 Chronic systolic (congestive) heart failure: Secondary | ICD-10-CM

## 2015-04-05 DIAGNOSIS — J438 Other emphysema: Secondary | ICD-10-CM

## 2015-04-05 DIAGNOSIS — I5032 Chronic diastolic (congestive) heart failure: Secondary | ICD-10-CM
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
## 2015-04-05 NOTE — Patient Instructions (Signed)
Your physician recommends that you schedule a follow-up appointment in: 3 months with Dr. Koneswaran  Your physician recommends that you continue on your current medications as directed. Please refer to the Current Medication list given to you today.  Thank you for choosing [facility]!    

## 2015-05-08 ENCOUNTER — Encounter (HOSPITAL_COMMUNITY): Payer: Self-pay | Admitting: *Deleted

## 2015-05-08 ENCOUNTER — Inpatient Hospital Stay (HOSPITAL_COMMUNITY)
Admission: EM | Admit: 2015-05-08 | Discharge: 2015-05-11 | DRG: 194 | Disposition: A | Discharge status: Home / Self Care | Payer: Medicare HMO | Attending: Internal Medicine | Admitting: Internal Medicine

## 2015-05-08 ENCOUNTER — Emergency Department (HOSPITAL_COMMUNITY): Payer: Medicare HMO

## 2015-05-08 DIAGNOSIS — J189 Pneumonia, unspecified organism: Secondary | ICD-10-CM | POA: Diagnosis present

## 2015-05-08 DIAGNOSIS — Z809 Family history of malignant neoplasm, unspecified: Secondary | ICD-10-CM

## 2015-05-08 DIAGNOSIS — I351 Nonrheumatic aortic (valve) insufficiency: Secondary | ICD-10-CM | POA: Diagnosis present

## 2015-05-08 DIAGNOSIS — E039 Hypothyroidism, unspecified: Secondary | ICD-10-CM | POA: Diagnosis present

## 2015-05-08 DIAGNOSIS — J441 Chronic obstructive pulmonary disease with (acute) exacerbation: Secondary | ICD-10-CM

## 2015-05-08 DIAGNOSIS — I129 Hypertensive chronic kidney disease with stage 1 through stage 4 chronic kidney disease, or unspecified chronic kidney disease: Secondary | ICD-10-CM | POA: Diagnosis present

## 2015-05-08 DIAGNOSIS — N189 Chronic kidney disease, unspecified: Secondary | ICD-10-CM

## 2015-05-08 DIAGNOSIS — E230 Hypopituitarism: Secondary | ICD-10-CM | POA: Diagnosis present

## 2015-05-08 DIAGNOSIS — Z85528 Personal history of other malignant neoplasm of kidney: Secondary | ICD-10-CM

## 2015-05-08 DIAGNOSIS — Z9981 Dependence on supplemental oxygen: Secondary | ICD-10-CM

## 2015-05-08 DIAGNOSIS — I10 Essential (primary) hypertension: Secondary | ICD-10-CM | POA: Diagnosis present

## 2015-05-08 DIAGNOSIS — G40909 Epilepsy, unspecified, not intractable, without status epilepticus: Secondary | ICD-10-CM

## 2015-05-08 DIAGNOSIS — R0602 Shortness of breath: Secondary | ICD-10-CM

## 2015-05-08 DIAGNOSIS — Z905 Acquired absence of kidney: Secondary | ICD-10-CM | POA: Diagnosis present

## 2015-05-08 DIAGNOSIS — Z7951 Long term (current) use of inhaled steroids: Secondary | ICD-10-CM

## 2015-05-08 DIAGNOSIS — Y95 Nosocomial condition: Secondary | ICD-10-CM | POA: Diagnosis present

## 2015-05-08 DIAGNOSIS — Z87891 Personal history of nicotine dependence: Secondary | ICD-10-CM

## 2015-05-08 DIAGNOSIS — N183 Chronic kidney disease, stage 3 (moderate): Secondary | ICD-10-CM | POA: Diagnosis present

## 2015-05-08 DIAGNOSIS — N179 Acute kidney failure, unspecified: Secondary | ICD-10-CM

## 2015-05-08 DIAGNOSIS — I5022 Chronic systolic (congestive) heart failure: Secondary | ICD-10-CM | POA: Diagnosis present

## 2015-05-08 DIAGNOSIS — E669 Obesity, unspecified: Secondary | ICD-10-CM | POA: Diagnosis present

## 2015-05-08 DIAGNOSIS — Z8249 Family history of ischemic heart disease and other diseases of the circulatory system: Secondary | ICD-10-CM

## 2015-05-08 DIAGNOSIS — E785 Hyperlipidemia, unspecified: Secondary | ICD-10-CM | POA: Diagnosis present

## 2015-05-08 DIAGNOSIS — Z79899 Other long term (current) drug therapy: Secondary | ICD-10-CM

## 2015-05-08 DIAGNOSIS — E119 Type 2 diabetes mellitus without complications: Secondary | ICD-10-CM

## 2015-05-08 LAB — CBC WITH DIFFERENTIAL/PLATELET
Basophils Absolute: 0 10*3/uL (ref 0.0–0.1)
Basophils Relative: 1 % (ref 0–1)
Eosinophils Absolute: 0.8 10*3/uL — ABNORMAL HIGH (ref 0.0–0.7)
Eosinophils Relative: 13 % — ABNORMAL HIGH (ref 0–5)
HEMATOCRIT: 32.6 % — AB (ref 39.0–52.0)
HEMOGLOBIN: 10.2 g/dL — AB (ref 13.0–17.0)
LYMPHS PCT: 18 % (ref 12–46)
Lymphs Abs: 1.1 10*3/uL (ref 0.7–4.0)
MCH: 27.6 pg (ref 26.0–34.0)
MCHC: 31.3 g/dL (ref 30.0–36.0)
MCV: 88.1 fL (ref 78.0–100.0)
MONO ABS: 0.8 10*3/uL (ref 0.1–1.0)
Monocytes Relative: 13 % — ABNORMAL HIGH (ref 3–12)
NEUTROS ABS: 3.4 10*3/uL (ref 1.7–7.7)
Neutrophils Relative %: 55 % (ref 43–77)
Platelets: 160 10*3/uL (ref 150–400)
RBC: 3.7 MIL/uL — ABNORMAL LOW (ref 4.22–5.81)
RDW: 15 % (ref 11.5–15.5)
WBC: 6 10*3/uL (ref 4.0–10.5)

## 2015-05-08 MED ORDER — METHYLPREDNISOLONE SODIUM SUCC 125 MG IJ SOLR
125.0000 mg | Freq: Once | INTRAMUSCULAR | Status: AC
  Administered 2015-05-09: 125 mg via INTRAVENOUS
  Filled 2015-05-08: qty 2

## 2015-05-08 MED ORDER — ALBUTEROL (5 MG/ML) CONTINUOUS INHALATION SOLN
15.0000 mg/h | INHALATION_SOLUTION | RESPIRATORY_TRACT | Status: DC
  Administered 2015-05-09: 15 mg/h via RESPIRATORY_TRACT
  Filled 2015-05-08: qty 20

## 2015-05-08 NOTE — ED Provider Notes (Signed)
TIME SEEN: 11:20 PM  CHIEF COMPLAINT: Shortness of breath  HPI: Pt is a 69 y.o. male with history of COPD who is on 2 L of oxygen at home, hypertension, hypothyroidism, hyperlipidemia, chronic kidney disease, CHF, seizures on Dilantin who presents to the emergency department with shortness of breath for the past 2 days with wheezing. No chest pain or chest discomfort. No fever. States he has had a cough but is not coughing anything up. Reports the cough is new for him. He has bilateral lower extremity swelling which is unchanged. No calf tenderness. No history of PE or DVT.  ROS: See HPI Constitutional: no fever  Eyes: no drainage  ENT: no runny nose   Cardiovascular:  no chest pain  Resp:  SOB  GI: no vomiting GU: no dysuria Integumentary: no rash  Allergy: no hives  Musculoskeletal: no leg swelling  Neurological: no slurred speech ROS otherwise negative  PAST MEDICAL HISTORY/PAST SURGICAL HISTORY:  Past Medical History  Diagnosis Date  . Chronic obstructive pulmonary disease     Chronic bronchitis;  home oxygen; multiple exacerbations  . Hypertension   . Cellulitis of lower leg   . Sleep apnea     Severe on a sleep study in 12/2010  . Hypothyroid     10/2002: TSH-0.43, T4-0.77  . Tobacco abuse, in remission     20 pack years; discontinued 1998  . Hyperlipidemia     No lipid profile available  . 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 Morbid obesity with BMI of 50.0-59.9, adult 10/28/2012  . Seizure disorder     Onset after craniotomy  . Aortic valve disease 10/2012    Prominent diastolic murmur; mild to moderate aortic insufficiency on echocardiogram in 10/2012  . Chronic kidney disease     S/P right nephrectomy for hypernephroma in 2010  . Seizures   . CHF (congestive heart failure)   . Diabetes mellitus without complication   . GI bleed     MEDICATIONS:  Prior  to Admission medications   Medication Sig Start Date End Date Taking? Authorizing Provider  albuterol (PROVENTIL) (2.5 MG/3ML) 0.083% nebulizer solution Take 3 mLs (2.5 mg total) by nebulization every 4 (four) hours as needed for wheezing or shortness of breath. 12/19/14  Yes Geradine Girt, DO  budesonide-formoterol (SYMBICORT) 160-4.5 MCG/ACT inhaler Inhale 2 puffs into the lungs 2 (two) times daily.   Yes Historical Provider, MD  mometasone (NASONEX) 50 MCG/ACT nasal spray Place 2 sprays into the nose daily. 03/25/14  Yes [name], MD  OXYGEN Inhale 2 L into the lungs continuous.   Yes Historical Provider, MD  pantoprazole (PROTONIX) 40 MG tablet Take 1 tablet (40 mg total) by mouth 2 (two) times daily before a meal. Patient taking differently: Take 40 mg by mouth daily as needed (FOR GERD).  03/10/15  Yes [name], MD  phenytoin (DILANTIN) 100 MG ER capsule Take 200-300 mg by mouth daily. Takes 200 mg on Monday and Thursdsay. On all other days of the week takes 300 mg.   Yes Historical Provider, MD  polyethylene glycol powder (GLYCOLAX/MIRALAX) powder Take 17 g by mouth daily. May decrease to 17 g three times a week if needed. Patient taking differently: Take 17 g by mouth daily. May decrease to 17 g three times a week if needed. 01/24/15  Yes Carlis Stable, NP  camphor-menthol [facility]) lotion Apply topically as needed  for itching. Patient not taking: Reported on 05/08/2015 12/19/14   Geradine Girt, DO  cloNIDine (CATAPRES) 0.2 MG tablet Take 1 tablet (0.2 mg total) by mouth 3 (three) times daily. 11/16/14   Herminio Commons, MD  desmopressin (DDAVP) 0.2 MG tablet Take 1 tablet (0.2 mg total) by mouth 2 (two) times daily. 06/14/14   [name], MD  dexamethasone (DECADRON) 0.5 MG tablet Take 0.5 mg by mouth daily.    Historical Provider, MD  furosemide (LASIX) 80 MG tablet Take 80 mg by mouth daily.    Historical Provider, MD  HYDROcodone-acetaminophen (NORCO/VICODIN) 5-325 MG per tablet  Take two tablets by mouth every 6 hours as needed for pain. Max APAP 3gm/24hr from all sources Patient not taking: Reported on 05/08/2015 01/10/15   Estill Dooms, MD  hydrocortisone (PROCTOSOL HC) 2.5 % rectal cream Place 1 application rectally 2 (two) times daily. Do not use for longer than 7 days at a time. Patient not taking: Reported on 05/08/2015 01/24/15   Carlis Stable, NP  insulin aspart (NOVOLOG) 100 UNIT/ML injection Inject 0-9 Units into the skin 3 (three) times daily with meals. Patient not taking: Reported on 05/08/2015 12/19/14   Geradine Girt, DO  ipratropium (ATROVENT) 0.02 % nebulizer solution Take 0.5 mg by nebulization 4 (four) times daily.    Historical Provider, MD  levothyroxine (SYNTHROID, LEVOTHROID) 175 MCG tablet Take 175 mcg by mouth daily before breakfast.    Historical Provider, MD  ondansetron (ZOFRAN) 4 MG tablet Take 1 tablet (4 mg total) by mouth every 8 (eight) hours as needed for nausea or vomiting. Patient not taking: Reported on 05/08/2015 12/19/14   Geradine Girt, DO  peg 3350 powder (MOVIPREP) 100 G SOLR Take 1 kit (200 g total) by mouth as directed. Patient not taking: Reported on 05/08/2015 02/05/15   Daneil Dolin, MD  potassium chloride SA (K-DUR,KLOR-CON) 20 MEQ tablet Take 20 mEq by mouth 2 (two) times daily.    Historical Provider, MD  simvastatin (ZOCOR) 80 MG tablet Take 80 mg by mouth daily.     Historical Provider, MD  sodium bicarbonate 650 MG tablet Take 1 tablet (650 mg total) by mouth 2 (two) times daily. Patient taking differently: Take 650 mg by mouth daily.  12/19/14   Geradine Girt, DO    ALLERGIES:  No Known Allergies  SOCIAL HISTORY:  History  Substance Use Topics  . Smoking status: Former Smoker -- 1.00 packs/day    Types: Cigarettes    Start date: 11/20/1960    Quit date: 11/16/1989  . Smokeless tobacco: Never Used  . Alcohol Use: No    FAMILY HISTORY: Family History  Problem Relation Age of Onset  . Cancer Mother   . Cancer  Father   . Cancer Sister   . Heart failure Sister   . Cancer Brother   . Colon cancer Neg Hx     EXAM: BP 120/41 mmHg  Pulse 76  Temp(Src) 98.4 F (36.9 C) (Oral)  Resp 20  Ht 6' 1"  (1.854 m)  Wt 250 lb (113.399 kg)  BMI 32.99 kg/m2  SpO2 99% CONSTITUTIONAL: Alert and oriented and responds appropriately to questions. Elderly, in no distress HEAD: Normocephalic EYES: Conjunctivae clear, PERRL ENT: normal nose; no rhinorrhea; moist mucous membranes; pharynx without lesions noted NECK: Supple, no meningismus, no LAD  CARD: RRR; S1 and S2 appreciated; no murmurs, no clicks, no rubs, no gallops RESP: Normal chest excursion without splinting, patient is mildly  tachypneic, diffuse expiratory wheezing, no rhonchi or rales, diminished at his bases bilaterally, no hypoxia, speaking short sentences, no respiratory distress ABD/GI: Normal bowel sounds; non-distended; soft, non-tender, no rebound, no guarding, no peritoneal signs BACK:  The back appears normal and is non-tender to palpation, there is no CVA tenderness EXT: Normal ROM in all joints; non-tender to palpation; bilateral nonpitting edema to the midcalf; normal capillary refill; no cyanosis, no calf tenderness or swelling    SKIN: Normal color for age and race; warm NEURO: Moves all extremities equally, sensation to light touch intact diffusely, cranial nerves II through XII intact PSYCH: The patient's mood and manner are appropriate. Grooming and personal hygiene are appropriate.  MEDICAL DECISION MAKING: Patient here with likely COPD exacerbation versus CHF exacerbation. We'll give continuous albuterol, Solu-Medrol. He does have swelling in his legs but states this is unchanged from baseline. No JVD on exam. We'll obtain cardiac labs, chest x-ray.  ED PROGRESS: The patient's chest x-ray shows patchy bilateral airspace opacities that may reflect multifocal pneumonia. He does not have fever or leukocytosis the chest x-ray is concerning  when I have reviewed it as well. He does report a new cough. We'll give vancomycin and Zosyn given he was recently admitted to the hospital March 25 for GI bleed to cover him for healthcare associated pneumonia.   12:30 AM  Pt's breath sounds are improving with diminished wheezing and he is now speaking full sentences but his lungs are completely clear. His creatinine is also elevated at 2.67, baseline is normally around 1.8. Troponin negative. We'll discuss with hospitalist for admission for multifocal pneumonia, COPD exacerbation, acute on chronic renal failure.  12:50 AM  D/w Dr. Marin Comment for admission.   CRITICAL CARE Performed by: Nyra Jabs   Total critical care time: 40 minutes  Critical care time was exclusive of separately billable procedures and treating other patients.  Critical care was necessary to treat or prevent imminent or life-threatening deterioration.  Critical care was time spent personally by me on the following activities: development of treatment plan with patient and/or surrogate as well as nursing, discussions with consultants, evaluation of patient's response to treatment, examination of patient, obtaining history from patient or surrogate, ordering and performing treatments and interventions, ordering and review of laboratory studies, ordering and review of radiographic studies, pulse oximetry and re-evaluation of patient's condition.     EKG Interpretation  Date/Time:  Wednesday May 09 2015 00:13:16 EDT Ventricular Rate:  79 PR Interval:  234 QRS Duration: 185 QT Interval:  490 QTC Calculation: 562 R Axis:   -72 Text Interpretation:  Sinus rhythm Prolonged PR interval RBBB and LAFB No significant change since last tracing Confirmed by Marigny Borre,  DO, Rossie Scarfone [phone]) on 05/09/2015 12:19:45 AM        Rice, DO 05/09/15 [id number]

## 2015-05-08 NOTE — ED Notes (Signed)
Pt c/o sob that has gotten progressively worse since yesterday; pt has audible wheezing and accessory muscle use; pt in on 2l O2 all the time

## 2015-05-09 ENCOUNTER — Encounter (HOSPITAL_COMMUNITY): Payer: Self-pay | Admitting: Internal Medicine

## 2015-05-09 DIAGNOSIS — Z7951 Long term (current) use of inhaled steroids: Secondary | ICD-10-CM | POA: Diagnosis not present

## 2015-05-09 DIAGNOSIS — Y95 Nosocomial condition: Secondary | ICD-10-CM | POA: Diagnosis present

## 2015-05-09 DIAGNOSIS — I351 Nonrheumatic aortic (valve) insufficiency: Secondary | ICD-10-CM | POA: Diagnosis present

## 2015-05-09 DIAGNOSIS — Z905 Acquired absence of kidney: Secondary | ICD-10-CM | POA: Diagnosis present

## 2015-05-09 DIAGNOSIS — I10 Essential (primary) hypertension: Secondary | ICD-10-CM | POA: Diagnosis not present

## 2015-05-09 DIAGNOSIS — I5022 Chronic systolic (congestive) heart failure: Secondary | ICD-10-CM | POA: Diagnosis present

## 2015-05-09 DIAGNOSIS — Z79899 Other long term (current) drug therapy: Secondary | ICD-10-CM | POA: Diagnosis not present

## 2015-05-09 DIAGNOSIS — I129 Hypertensive chronic kidney disease with stage 1 through stage 4 chronic kidney disease, or unspecified chronic kidney disease: Secondary | ICD-10-CM | POA: Diagnosis present

## 2015-05-09 DIAGNOSIS — E119 Type 2 diabetes mellitus without complications: Secondary | ICD-10-CM | POA: Diagnosis present

## 2015-05-09 DIAGNOSIS — J189 Pneumonia, unspecified organism: Principal | ICD-10-CM

## 2015-05-09 DIAGNOSIS — Z87891 Personal history of nicotine dependence: Secondary | ICD-10-CM | POA: Diagnosis not present

## 2015-05-09 DIAGNOSIS — N183 Chronic kidney disease, stage 3 (moderate): Secondary | ICD-10-CM | POA: Diagnosis not present

## 2015-05-09 DIAGNOSIS — Z85528 Personal history of other malignant neoplasm of kidney: Secondary | ICD-10-CM | POA: Diagnosis not present

## 2015-05-09 DIAGNOSIS — Z9981 Dependence on supplemental oxygen: Secondary | ICD-10-CM | POA: Diagnosis not present

## 2015-05-09 DIAGNOSIS — E039 Hypothyroidism, unspecified: Secondary | ICD-10-CM | POA: Diagnosis present

## 2015-05-09 DIAGNOSIS — Z8249 Family history of ischemic heart disease and other diseases of the circulatory system: Secondary | ICD-10-CM | POA: Diagnosis not present

## 2015-05-09 DIAGNOSIS — E230 Hypopituitarism: Secondary | ICD-10-CM | POA: Diagnosis present

## 2015-05-09 DIAGNOSIS — R0602 Shortness of breath: Secondary | ICD-10-CM | POA: Diagnosis present

## 2015-05-09 DIAGNOSIS — E785 Hyperlipidemia, unspecified: Secondary | ICD-10-CM | POA: Diagnosis present

## 2015-05-09 DIAGNOSIS — Z809 Family history of malignant neoplasm, unspecified: Secondary | ICD-10-CM | POA: Diagnosis not present

## 2015-05-09 DIAGNOSIS — E669 Obesity, unspecified: Secondary | ICD-10-CM | POA: Diagnosis present

## 2015-05-09 LAB — BASIC METABOLIC PANEL
ANION GAP: 13 (ref 5–15)
Anion gap: 13 (ref 5–15)
BUN: 57 mg/dL — AB (ref 6–20)
BUN: 60 mg/dL — AB (ref 6–20)
CALCIUM: 8.8 mg/dL — AB (ref 8.9–10.3)
CO2: 25 mmol/L (ref 22–32)
CO2: 27 mmol/L (ref 22–32)
CREATININE: 2.73 mg/dL — AB (ref 0.61–1.24)
Calcium: 8.5 mg/dL — ABNORMAL LOW (ref 8.9–10.3)
Chloride: 97 mmol/L — ABNORMAL LOW (ref 101–111)
Chloride: 98 mmol/L — ABNORMAL LOW (ref 101–111)
Creatinine, Ser: 2.67 mg/dL — ABNORMAL HIGH (ref 0.61–1.24)
GFR calc Af Amer: 26 mL/min — ABNORMAL LOW (ref >60)
GFR calc Af Amer: 26 mL/min — ABNORMAL LOW (ref >60)
GFR calc non Af Amer: 23 mL/min — ABNORMAL LOW (ref >60)
GFR, EST NON AFRICAN AMERICAN: 22 mL/min — AB (ref >60)
GLUCOSE: 95 mg/dL (ref 65–99)
Glucose, Bld: 187 mg/dL — ABNORMAL HIGH (ref 65–99)
Potassium: 3.4 mmol/L — ABNORMAL LOW (ref 3.5–5.1)
Potassium: 3.7 mmol/L (ref 3.5–5.1)
Sodium: 136 mmol/L (ref 135–145)
Sodium: 137 mmol/L (ref 135–145)

## 2015-05-09 LAB — CBC
HEMATOCRIT: 31.3 % — AB (ref 39.0–52.0)
Hemoglobin: 9.9 g/dL — ABNORMAL LOW (ref 13.0–17.0)
MCH: 27.8 pg (ref 26.0–34.0)
MCHC: 31.6 g/dL (ref 30.0–36.0)
MCV: 87.9 fL (ref 78.0–100.0)
Platelets: 159 10*3/uL (ref 150–400)
RBC: 3.56 MIL/uL — AB (ref 4.22–5.81)
RDW: 14.9 % (ref 11.5–15.5)
WBC: 5.2 10*3/uL (ref 4.0–10.5)

## 2015-05-09 LAB — BRAIN NATRIURETIC PEPTIDE: B NATRIURETIC PEPTIDE 5: 363 pg/mL — AB (ref 0.0–100.0)

## 2015-05-09 LAB — TROPONIN I: TROPONIN I: 0.03 ng/mL (ref <0.031)

## 2015-05-09 LAB — MRSA PCR SCREENING: MRSA by PCR: NEGATIVE

## 2015-05-09 LAB — PHENYTOIN LEVEL, TOTAL: Phenytoin Lvl: 13.4 ug/mL (ref 10.0–20.0)

## 2015-05-09 MED ORDER — PANTOPRAZOLE SODIUM 40 MG PO TBEC: 40.0000 mg | DELAYED_RELEASE_TABLET | Freq: Every day | ORAL | Status: DC | PRN

## 2015-05-09 MED ORDER — DIPHENHYDRAMINE HCL 50 MG/ML IJ SOLN
50.0000 mg | Freq: Once | INTRAMUSCULAR | Status: AC
  Administered 2015-05-09: 50 mg via INTRAVENOUS
  Filled 2015-05-09: qty 1

## 2015-05-09 MED ORDER — CLONIDINE HCL 0.2 MG PO TABS
0.2000 mg | ORAL_TABLET | Freq: Three times a day (TID) | ORAL | Status: DC
  Administered 2015-05-09 – 2015-05-11 (×7): 0.2 mg via ORAL
  Filled 2015-05-09 (×7): qty 1

## 2015-05-09 MED ORDER — POTASSIUM CHLORIDE CRYS ER 20 MEQ PO TBCR
20.0000 meq | EXTENDED_RELEASE_TABLET | Freq: Two times a day (BID) | ORAL | Status: DC
  Administered 2015-05-09 – 2015-05-11 (×6): 20 meq via ORAL
  Filled 2015-05-09 (×6): qty 1

## 2015-05-09 MED ORDER — PHENYTOIN SODIUM EXTENDED 100 MG PO CAPS
200.0000 mg | ORAL_CAPSULE | ORAL | Status: DC
  Administered 2015-05-10: 200 mg via ORAL
  Filled 2015-05-09: qty 2

## 2015-05-09 MED ORDER — DESMOPRESSIN ACETATE 0.2 MG PO TABS
0.2000 mg | ORAL_TABLET | Freq: Two times a day (BID) | ORAL | Status: DC
  Administered 2015-05-09 – 2015-05-10 (×5): 0.2 mg via ORAL
  Filled 2015-05-09 (×6): qty 1

## 2015-05-09 MED ORDER — DESMOPRESSIN ACETATE 0.2 MG PO TABS
ORAL_TABLET | ORAL | Status: AC
  Filled 2015-05-09: qty 1

## 2015-05-09 MED ORDER — POLYETHYLENE GLYCOL 3350 17 G PO PACK
17.0000 g | PACK | Freq: Every day | ORAL | Status: DC
  Administered 2015-05-09 – 2015-05-11 (×2): 17 g via ORAL
  Filled 2015-05-09 (×3): qty 1

## 2015-05-09 MED ORDER — DEXAMETHASONE 0.5 MG PO TABS
0.5000 mg | ORAL_TABLET | Freq: Every day | ORAL | Status: DC
  Administered 2015-05-09: 0.5 mg via ORAL
  Filled 2015-05-09 (×3): qty 1

## 2015-05-09 MED ORDER — HEPARIN SODIUM (PORCINE) 5000 UNIT/ML IJ SOLN
5000.0000 [IU] | Freq: Three times a day (TID) | INTRAMUSCULAR | Status: DC
  Administered 2015-05-09 – 2015-05-11 (×7): 5000 [IU] via SUBCUTANEOUS
  Filled 2015-05-09 (×7): qty 1

## 2015-05-09 MED ORDER — VANCOMYCIN HCL IN DEXTROSE 1-5 GM/200ML-% IV SOLN
1000.0000 mg | Freq: Once | INTRAVENOUS | Status: AC
  Administered 2015-05-09: 1000 mg via INTRAVENOUS
  Filled 2015-05-09: qty 200

## 2015-05-09 MED ORDER — FUROSEMIDE 10 MG/ML IJ SOLN
40.0000 mg | Freq: Every day | INTRAMUSCULAR | Status: DC
  Administered 2015-05-09 – 2015-05-11 (×3): 40 mg via INTRAVENOUS
  Filled 2015-05-09 (×3): qty 4

## 2015-05-09 MED ORDER — VANCOMYCIN HCL IN DEXTROSE 1-5 GM/200ML-% IV SOLN
INTRAVENOUS | Status: AC
  Filled 2015-05-09: qty 200

## 2015-05-09 MED ORDER — LEVOTHYROXINE SODIUM 75 MCG PO TABS
175.0000 ug | ORAL_TABLET | Freq: Every day | ORAL | Status: DC
  Administered 2015-05-09 – 2015-05-11 (×3): 175 ug via ORAL
  Filled 2015-05-09 (×6): qty 1

## 2015-05-09 MED ORDER — PHENYTOIN SODIUM EXTENDED 100 MG PO CAPS
300.0000 mg | ORAL_CAPSULE | ORAL | Status: DC
  Administered 2015-05-09 – 2015-05-11 (×2): 300 mg via ORAL
  Filled 2015-05-09 (×2): qty 3

## 2015-05-09 MED ORDER — SODIUM BICARBONATE 650 MG PO TABS
650.0000 mg | ORAL_TABLET | Freq: Every day | ORAL | Status: DC
Start: 2015-05-09 — End: 2015-05-11
  Administered 2015-05-09 – 2015-05-11 (×3): 650 mg via ORAL
  Filled 2015-05-09 (×3): qty 1

## 2015-05-09 MED ORDER — SODIUM CHLORIDE 0.9 % IV SOLN
INTRAVENOUS | Status: DC
  Administered 2015-05-09: 01:00:00 via INTRAVENOUS

## 2015-05-09 MED ORDER — ALBUTEROL SULFATE (2.5 MG/3ML) 0.083% IN NEBU: 2.5000 mg | INHALATION_SOLUTION | RESPIRATORY_TRACT | Status: DC | PRN

## 2015-05-09 MED ORDER — PIPERACILLIN-TAZOBACTAM 3.375 G IVPB
3.3750 g | Freq: Once | INTRAVENOUS | Status: AC
  Administered 2015-05-09: 3.375 g via INTRAVENOUS
  Filled 2015-05-09: qty 50

## 2015-05-09 MED ORDER — POLYETHYLENE GLYCOL 3350 17 GM/SCOOP PO POWD
17.0000 g | Freq: Every day | ORAL | Status: DC
  Filled 2015-05-09: qty 255

## 2015-05-09 MED ORDER — IPRATROPIUM BROMIDE 0.02 % IN SOLN
0.5000 mg | Freq: Four times a day (QID) | RESPIRATORY_TRACT | Status: DC
  Administered 2015-05-09 – 2015-05-11 (×9): 0.5 mg via RESPIRATORY_TRACT
  Filled 2015-05-09 (×9): qty 2.5

## 2015-05-09 MED ORDER — CETYLPYRIDINIUM CHLORIDE 0.05 % MT LIQD
7.0000 mL | Freq: Two times a day (BID) | OROMUCOSAL | Status: DC
  Administered 2015-05-09 – 2015-05-10 (×4): 7 mL via OROMUCOSAL

## 2015-05-09 MED ORDER — SODIUM CHLORIDE 0.9 % IV SOLN
1500.0000 mg | INTRAVENOUS | Status: DC
  Administered 2015-05-10: 1500 mg via INTRAVENOUS
  Filled 2015-05-09 (×2): qty 1500

## 2015-05-09 MED ORDER — PIPERACILLIN-TAZOBACTAM 3.375 G IVPB
3.3750 g | Freq: Three times a day (TID) | INTRAVENOUS | Status: DC
  Administered 2015-05-09 – 2015-05-11 (×6): 3.375 g via INTRAVENOUS
  Filled 2015-05-09 (×16): qty 50

## 2015-05-09 MED ORDER — SODIUM CHLORIDE 0.9 % IJ SOLN
3.0000 mL | Freq: Two times a day (BID) | INTRAMUSCULAR | Status: DC
  Administered 2015-05-09 – 2015-05-11 (×6): 3 mL via INTRAVENOUS

## 2015-05-09 MED ORDER — ATORVASTATIN CALCIUM 40 MG PO TABS
40.0000 mg | ORAL_TABLET | Freq: Every day | ORAL | Status: DC
  Administered 2015-05-09 – 2015-05-10 (×2): 40 mg via ORAL
  Filled 2015-05-09 (×2): qty 1

## 2015-05-09 MED ORDER — BUDESONIDE-FORMOTEROL FUMARATE 160-4.5 MCG/ACT IN AERO
2.0000 | INHALATION_SPRAY | Freq: Two times a day (BID) | RESPIRATORY_TRACT | Status: DC
  Administered 2015-05-09 – 2015-05-11 (×5): 2 via RESPIRATORY_TRACT
  Filled 2015-05-09: qty 6

## 2015-05-09 NOTE — Progress Notes (Signed)
ANTIBIOTIC CONSULT NOTE - FOLLOW UP  Pharmacy Consult for Vancomycin and Zosyn  Indication: pneumonia  No Known Allergies  Patient Measurements: Height: 6\' 1"  (185.4 cm) Weight: 239 lb 1.6 oz (108.455 kg) IBW/kg (Calculated) : 79.9  Vital Signs: Temp: 98.4 F (36.9 C) (06/01 0500) Temp Source: Oral (06/01 0500) BP: 124/41 mmHg (06/01 0500) Pulse Rate: 89 (06/01 0500) Intake/Output from previous day: 05/31 0701 - 06/01 0700 In: -  Out: 300 [Urine:300] Intake/Output from this shift: Total I/O In: 243 [P.O.:240; I.V.:3] Out: 300 [Urine:300]  Labs:  Recent Labs  05/08/15 2340 05/09/15 0620  WBC 6.0 5.2  HGB 10.2* 9.9*  PLT 160 159  CREATININE 2.67* 2.73*   Estimated Creatinine Clearance: 32.5 mL/min (by C-G formula based on Cr of 2.73). No results for input(s): VANCOTROUGH, VANCOPEAK, VANCORANDOM, GENTTROUGH, GENTPEAK, GENTRANDOM, TOBRATROUGH, TOBRAPEAK, TOBRARND, AMIKACINPEAK, AMIKACINTROU, AMIKACIN in the last 72 hours.   Microbiology: Recent Results (from the past 720 hour(s))  MRSA PCR Screening     Status: None   Collection Time: 05/09/15  3:10 AM  Result Value Ref Range Status   MRSA by PCR NEGATIVE NEGATIVE Final    Comment:        The GeneXpert MRSA Assay (FDA approved for NASAL specimens only), is one component of a comprehensive MRSA colonization surveillance program. It is not intended to diagnose MRSA infection nor to guide or monitor treatment for MRSA infections.     Anti-infectives    Start     Dose/Rate Route Frequency Ordered Stop   05/10/15 2200  vancomycin (VANCOCIN) 1,500 mg in sodium chloride 0.9 % 500 mL IVPB     1,500 mg 250 mL/hr over 120 Minutes Intravenous Every 48 hours 05/09/15 0928     05/09/15 1000  piperacillin-tazobactam (ZOSYN) IVPB 3.375 g     3.375 g 12.5 mL/hr over 240 Minutes Intravenous Every 8 hours 05/09/15 0928     05/09/15 0330  vancomycin (VANCOCIN) IVPB 1000 mg/200 mL premix     1,000 mg 200 mL/hr over 60  Minutes Intravenous  Once 05/09/15 0324 05/09/15 0451   05/09/15 0030  vancomycin (VANCOCIN) IVPB 1000 mg/200 mL premix     1,000 mg 200 mL/hr over 60 Minutes Intravenous  Once 05/09/15 0018 05/09/15 0241   05/09/15 0030  piperacillin-tazobactam (ZOSYN) IVPB 3.375 g     3.375 g 12.5 mL/hr over 240 Minutes Intravenous  Once 05/09/15 0018 05/09/15 0145     Assessment: Okay for Protocol, patient received initial doses in ED.  Obesity/Normalized CrCl dosing protocol will be initiated with an estimated normalized CrCl = 25  Goal of Therapy:  Vancomycin trough level 15-20 mcg/ml  Plan:  Zosyn 3.375gm IV every 8 hours. Follow-up micro data, labs, vitals. Vancomycin 1500mg  IV every 48 hours Measure antibiotic drug levels at steady state Follow up culture results  Pricilla Larsson 05/09/2015,11:28 AM

## 2015-05-09 NOTE — Progress Notes (Signed)
ANTIBIOTIC CONSULT NOTE-Preliminary  Pharmacy Consult for Vancomycin and Zosyn Indication: Pneumonia  No Known Allergies  Patient Measurements: Height: 6\' 1"  (185.4 cm) Weight: 239 lb 1.6 oz (108.455 kg) IBW/kg (Calculated) : 79.9   Vital Signs: Temp: 98.5 F (36.9 C) (06/01 0300) Temp Source: Oral (06/01 0300) BP: 153/42 mmHg (06/01 0300) Pulse Rate: 93 (06/01 0300)  Labs:  Recent Labs  05/08/15 2340  WBC 6.0  HGB 10.2*  PLT 160  CREATININE 2.67*    Estimated Creatinine Clearance: 33.2 mL/min (by C-G formula based on Cr of 2.67).  No results for input(s): VANCOTROUGH, VANCOPEAK, VANCORANDOM, GENTTROUGH, GENTPEAK, GENTRANDOM, TOBRATROUGH, TOBRAPEAK, TOBRARND, AMIKACINPEAK, AMIKACINTROU, AMIKACIN in the last 72 hours.   Microbiology: No results found for this or any previous visit (from the past 720 hour(s)).  Medical History: Past Medical History  Diagnosis Date  . Chronic obstructive pulmonary disease     Chronic bronchitis;  home oxygen; multiple exacerbations  . Hypertension   . Cellulitis of lower leg   . Sleep apnea     Severe on a sleep study in 12/2010  . Hypothyroid     10/2002: TSH-0.43, T4-0.77  . Tobacco abuse, in remission     20 pack years; discontinued 1998  . Hyperlipidemia     No lipid profile available  . 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 Morbid obesity with BMI of 50.0-59.9, adult 10/28/2012  . Seizure disorder     Onset after craniotomy  . Aortic valve disease 10/2012    Prominent diastolic murmur; mild to moderate aortic insufficiency on echocardiogram in 10/2012  . Chronic kidney disease     S/P right nephrectomy for hypernephroma in 2010  . Seizures   . CHF (congestive heart failure)   . Diabetes mellitus without complication   . GI bleed     Medications:  Zosyn 3.375 Gm IV x 1 dose in the ED at 0100 05/09/15 Vancomycin 1  Gm IV x 1 dose in the ED at 0100 05/09/15  Assessment: 69 yo male with h/o COPD, CHF, and CKD s/p hospital admission in April for GI bleed; presents with SOB and chest x-ray evidence of pneumonia. Empiric antibiotic for HCAP.   Goal of Therapy:  Vancomycin troughs 15-20 mcg/ml Eradicated infection  Plan:  Preliminary review of pertinent patient information completed.  Protocol will be initiated with a one-time dose of Vancomycin 1 Gm IV in addition to the 1 Gm dose ordered in the ED, for a total loading dose of 2000 mg.  [name] clinical pharmacist will complete review during morning rounds to assess patient and finalize treatment regimen.  Norberto Sorenson, [facility] 05/09/2015,3:27 AM

## 2015-05-09 NOTE — Progress Notes (Signed)
UR chart review completed.  

## 2015-05-09 NOTE — Consult Note (Signed)
Consult requested by: Dr. 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 He also has chronic renal failure. He said he was doing about as usual at home when he developed cough and shortness of breath over the last 2 or 3 days. He's been coughing but not able to cough anything up. He has not had any chest pain. He came to the emergency department and was found to have bilateral patchy infiltrates consistent with multifocal pneumonia. He says he feels a little better this morning.  Past Medical History  Diagnosis Date  . Chronic obstructive pulmonary disease     Chronic bronchitis;  home oxygen; multiple exacerbations  . Hypertension   . Cellulitis of lower leg   . Sleep apnea     Severe on a sleep study in 12/2010  . Hypothyroid     10/2002: TSH-0.43, T4-0.77  . Tobacco abuse, in remission     20 pack years; discontinued 1998  . Hyperlipidemia     No lipid profile available  . 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 Morbid obesity with BMI of 50.0-59.9, adult 10/28/2012  . Seizure disorder     Onset after craniotomy  . Aortic valve disease 10/2012    Prominent diastolic murmur; mild to moderate aortic insufficiency on echocardiogram in 10/2012  . Chronic kidney disease     S/P right nephrectomy for hypernephroma in 2010  . Seizures   . CHF (congestive heart failure)   . Diabetes mellitus without complication   . GI bleed      Family History  Problem Relation Age of Onset  . Cancer Mother   . Cancer Father   . Cancer Sister   . Heart failure Sister   . Cancer Brother   . Colon cancer Neg Hx      History   Social History  . Marital Status: Single    Spouse  Name: N/A  . Number of Children: 1  . Years of Education: N/A   Occupational History  . Retired     Engineer, manufacturing systems   Social History Main Topics  . Smoking status: Former Smoker -- 1.00 packs/day    Types: Cigarettes    Start date: 11/20/1960    Quit date: 11/16/1989  . Smokeless tobacco: Never Used  . Alcohol Use: No  . Drug Use: No  . Sexual Activity: Not on file   Other Topics Concern  . None   Social History Narrative   Lives in [place] alone with good family support from his daughter.     ROS: No hemoptysis or chest pain. He says he still short of breath. He has a nonproductive cough. No increase in swelling of his legs. Otherwise per the history and physical    Objective: Vital signs in last 24 hours: Temp:  [98.4 F (36.9 C)-98.7 F (37.1 C)] 98.4 F (36.9 C) (06/01 0500) Pulse Rate:  [76-93] 89 (06/01 0500) Resp:  [15-20] 17 (06/01 0500) BP: (119-153)/(35-42) 124/41 mmHg (06/01 0500) SpO2:  [92 %-100 %] 93 % (06/01 0712) Weight:  [108.455 kg (239 lb 1.6 oz)-113.399 kg (250 lb)] 108.455 kg (239 lb 1.6 oz) (06/01 0300) Weight change:     Intake/Output from previous day: 05/31 0701 - 06/01 0700 In: -  Out: 300 [Urine:300]  PHYSICAL EXAM [name] awake and alert. [name] obese. He still looks somewhat short of breath at rest. His pupils are reactive. Nose and throat are clear. His mucous membranes are moist. His neck is supple without masses. Chest shows fine rales diffusely as well as some rhonchi area he does not have any wheezing. His heart is regular without gallop. His abdomen is soft. Extremities show that he has much less edema than he has in the past. Central nervous system examination is grossly intact  Lab Results: Basic Metabolic Panel:  Recent Labs  05/08/15 2340 05/09/15 0620  NA 137 136  K 3.7 3.4*  CL 97* 98*  CO2 27 25  GLUCOSE 95 187*  BUN 60* 57*  CREATININE 2.67* 2.73*  CALCIUM 8.8* 8.5*   Liver Function Tests: No results for  input(s): AST, ALT, ALKPHOS, BILITOT, PROT, ALBUMIN in the last 72 hours. No results for input(s): LIPASE, AMYLASE in the last 72 hours. No results for input(s): AMMONIA in the last 72 hours. CBC:  Recent Labs  05/08/15 2340 05/09/15 0620  WBC 6.0 5.2  NEUTROABS 3.4  --   HGB 10.2* 9.9*  HCT 32.6* 31.3*  MCV 88.1 87.9  PLT 160 159   Cardiac Enzymes:  Recent Labs  05/08/15 2340  TROPONINI 0.03   BNP: No results for input(s): PROBNP in the last 72 hours. D-Dimer: No results for input(s): DDIMER in the last 72 hours. CBG: No results for input(s): GLUCAP in the last 72 hours. Hemoglobin A1C: No results for input(s): HGBA1C in the last 72 hours. Fasting Lipid Panel: No results for input(s): CHOL, HDL, LDLCALC, TRIG, CHOLHDL, LDLDIRECT in the last 72 hours. Thyroid Function Tests: No results for input(s): TSH, T4TOTAL, FREET4, T3FREE, THYROIDAB in the last 72 hours. Anemia Panel: No results for input(s): VITAMINB12, FOLATE, FERRITIN, TIBC, IRON, RETICCTPCT in the last 72 hours. Coagulation: No results for input(s): LABPROT, INR in the last 72 hours. Urine Drug Screen: Drugs of Abuse  No results found for: LABOPIA, COCAINSCRNUR, LABBENZ, AMPHETMU, THCU, LABBARB  Alcohol Level: No results for input(s): ETH in the last 72 hours. Urinalysis: No results for input(s): COLORURINE, LABSPEC, PHURINE, GLUCOSEU, HGBUR, BILIRUBINUR, KETONESUR, PROTEINUR, UROBILINOGEN, NITRITE, LEUKOCYTESUR in the last 72 hours.  Invalid input(s): APPERANCEUR Misc. Labs:   ABGS: No results for input(s): PHART, PO2ART, TCO2, HCO3 in the last 72 hours.  Invalid input(s): PCO2   MICROBIOLOGY: Recent Results (from the past 240 hour(s))  MRSA PCR Screening     Status: None   Collection Time: 05/09/15  3:10 AM  Result Value Ref Range Status   MRSA by PCR NEGATIVE NEGATIVE Final    Comment:        The GeneXpert MRSA Assay (FDA approved for NASAL specimens only), is one component of  a comprehensive MRSA colonization surveillance program. It is not intended to diagnose MRSA infection nor to guide or monitor treatment for MRSA infections.     Studies/Results: Dg Chest Port 1 View  05/09/2015   CLINICAL DATA:  Acute onset of shortness of breath. Initial encounter.  EXAM: PORTABLE CHEST - 1 VIEW  COMPARISON:  Chest radiograph performed 03/06/2015  FINDINGS: The lungs are well-aerated. Patchy bilateral mid lung airspace opacities may reflect multifocal pneumonia. Underlying vascular congestion is seen. The left lung base is not well characterized. No definite pleural effusion or pneumothorax is seen.  The cardiomediastinal silhouette is mildly enlarged. No acute osseous abnormalities are seen.  IMPRESSION: Patchy bilateral mid  lung airspace opacities may reflect multifocal pneumonia. Underlying vascular congestion and mild cardiomegaly noted.   Electronically Signed   By: Garald Balding M.D.   On: 05/09/2015 00:00    Medications:  Prior to Admission:  Prescriptions prior to admission  Medication Sig Dispense Refill Last Dose  . albuterol (PROVENTIL) (2.5 MG/3ML) 0.083% nebulizer solution Take 3 mLs (2.5 mg total) by nebulization every 4 (four) hours as needed for wheezing or shortness of breath. 75 mL 12 05/08/2015 at 1900  . budesonide-formoterol (SYMBICORT) 160-4.5 MCG/ACT inhaler Inhale 2 puffs into the lungs 2 (two) times daily.   05/08/2015 at Unknown time  . mometasone (NASONEX) 50 MCG/ACT nasal spray Place 2 sprays into the nose daily. 17 g 12 Past Month at Unknown time  . OXYGEN Inhale 2 L into the lungs continuous.   05/08/2015 at Unknown time  . pantoprazole (PROTONIX) 40 MG tablet Take 1 tablet (40 mg total) by mouth 2 (two) times daily before a meal. (Patient taking differently: Take 40 mg by mouth daily as needed (FOR GERD). ) 60 tablet 3 Past Month at Unknown time  . phenytoin (DILANTIN) 100 MG ER capsule Take 200-300 mg by mouth daily. Takes 200 mg on Monday and  Thursdsay. On all other days of the week takes 300 mg.   05/08/2015 at Unknown time  . polyethylene glycol powder (GLYCOLAX/MIRALAX) powder Take 17 g by mouth daily. May decrease to 17 g three times a week if needed. (Patient taking differently: Take 17 g by mouth daily. May decrease to 17 g three times a week if needed.) 255 g 3 05/07/2015 at Unknown time  . camphor-menthol (SARNA) lotion Apply topically as needed for itching. (Patient not taking: Reported on 05/08/2015) 222 mL 0 Taking  . cloNIDine (CATAPRES) 0.2 MG tablet Take 1 tablet (0.2 mg total) by mouth 3 (three) times daily. 90 tablet 3 Taking  . desmopressin (DDAVP) 0.2 MG tablet Take 1 tablet (0.2 mg total) by mouth 2 (two) times daily. 60 tablet 3 Taking  . dexamethasone (DECADRON) 0.5 MG tablet Take 0.5 mg by mouth daily.   Taking  . furosemide (LASIX) 80 MG tablet Take 80 mg by mouth daily.   Taking  . HYDROcodone-acetaminophen (NORCO/VICODIN) 5-325 MG per tablet Take two tablets by mouth every 6 hours as needed for pain. Max APAP 3gm/24hr from all sources (Patient not taking: Reported on 05/08/2015) 240 tablet 0 Taking  . hydrocortisone (PROCTOSOL HC) 2.5 % rectal cream Place 1 application rectally 2 (two) times daily. Do not use for longer than 7 days at a time. (Patient not taking: Reported on 05/08/2015) 30 g 0 Taking  . insulin aspart (NOVOLOG) 100 UNIT/ML injection Inject 0-9 Units into the skin 3 (three) times daily with meals. (Patient not taking: Reported on 05/08/2015) 10 mL 11 Taking  . ipratropium (ATROVENT) 0.02 % nebulizer solution Take 0.5 mg by nebulization 4 (four) times daily.   Taking  . levothyroxine (SYNTHROID, LEVOTHROID) 175 MCG tablet Take 175 mcg by mouth daily before breakfast.   Taking  . ondansetron (ZOFRAN) 4 MG tablet Take 1 tablet (4 mg total) by mouth every 8 (eight) hours as needed for nausea or vomiting. (Patient not taking: Reported on 05/08/2015) 20 tablet 0 Taking  . peg 3350 powder (MOVIPREP) 100 G SOLR Take  1 kit (200 g total) by mouth as directed. (Patient not taking: Reported on 05/08/2015) 1 kit 0 Taking  . potassium chloride SA (K-DUR,KLOR-CON) 20 MEQ tablet Take 20 mEq by  mouth 2 (two) times daily.   Taking  . simvastatin (ZOCOR) 80 MG tablet Take 80 mg by mouth daily.    Taking  . sodium bicarbonate 650 MG tablet Take 1 tablet (650 mg total) by mouth 2 (two) times daily. (Patient taking differently: Take 650 mg by mouth daily. )   Taking   Scheduled: . antiseptic oral rinse  7 mL Mouth Rinse BID  . atorvastatin  40 mg Oral q1800  . budesonide-formoterol  2 puff Inhalation BID  . cloNIDine  0.2 mg Oral TID  . desmopressin  0.2 mg Oral BID  . dexamethasone  0.5 mg Oral Daily  . furosemide  40 mg Intravenous Daily  . heparin  5,000 Units Subcutaneous 3 times per day  . ipratropium  0.5 mg Nebulization QID  . levothyroxine  175 mcg Oral QAC breakfast  . [START ON 05/10/2015] phenytoin  200 mg Oral Once per day on Mon Thu  . phenytoin  300 mg Oral Once per day on Sun Tue Wed Fri Sat  . polyethylene glycol  17 g Oral Daily  . potassium chloride SA  20 mEq Oral BID  . sodium bicarbonate  650 mg Oral Daily  . sodium chloride  3 mL Intravenous Q12H   Continuous: . albuterol Stopped (05/09/15 0146)   QQP:[id number], pantoprazole  Assesment: He was admitted with community-acquired pneumonia but as I have discussed with Dr. Legrand Rams he really has healthcare associated pneumonia after a hospitalization in March of this year. His antibiotics are appropriate for that he says he already feels better He may need a stress dose of steroids. Principal Problem:   CAP (community acquired pneumonia) Active Problems:   Obese   Seizure disorder   Diabetes   CKD (chronic kidney disease), stage III   Aortic insufficiency   Essential hypertension   HCAP (healthcare-associated pneumonia)    Plan: Continue vancomycin and Zosyn.    LOS: 0 days   HAWKINS,EDWARD L 05/09/2015, 8:12 AM

## 2015-05-09 NOTE — Progress Notes (Signed)
Subjective: Patient was admitted last night due to cough and shortness of breath due to pneumonia and COPD.  Objective: Vital signs in last 24 hours: Temp:  [98.4 F (36.9 C)-98.7 F (37.1 C)] 98.4 F (36.9 C) (06/01 0500) Pulse Rate:  [76-93] 89 (06/01 0500) Resp:  [15-20] 17 (06/01 0500) BP: (119-153)/(35-42) 124/41 mmHg (06/01 0500) SpO2:  [92 %-100 %] 93 % (06/01 0712) Weight:  [108.455 kg (239 lb 1.6 oz)-113.399 kg (250 lb)] 108.455 kg (239 lb 1.6 oz) (06/01 0300) Weight change:     Intake/Output from previous day: 05/31 0701 - 06/01 0700 In: -  Out: 300 [Urine:300]  PHYSICAL EXAM General appearance: alert and no distress Resp: dullness to percussion bilaterally, rales bibasilar and wheezes bilaterally Cardio: S1, S2 normal GI: soft, non-tender; bowel sounds normal; no masses,  no organomegaly Extremities: extremities normal, atraumatic, no cyanosis or edema  Lab Results:  Results for orders placed or performed during the hospital encounter of 05/08/15 (from the past 48 hour(s))  CBC with Differential     Status: Abnormal   Collection Time: 05/08/15 11:40 PM  Result Value Ref Range   WBC 6.0 4.0 - 10.5 K/uL   RBC 3.70 (L) 4.22 - 5.81 MIL/uL   Hemoglobin 10.2 (L) 13.0 - 17.0 g/dL   HCT 32.6 (L) 39.0 - 52.0 %   MCV 88.1 78.0 - 100.0 fL   MCH 27.6 26.0 - 34.0 pg   MCHC 31.3 30.0 - 36.0 g/dL   RDW 15.0 11.5 - 15.5 %   Platelets 160 150 - 400 K/uL   Neutrophils Relative % 55 43 - 77 %   Neutro Abs 3.4 1.7 - 7.7 K/uL   Lymphocytes Relative 18 12 - 46 %   Lymphs Abs 1.1 0.7 - 4.0 K/uL   Monocytes Relative 13 (H) 3 - 12 %   Monocytes Absolute 0.8 0.1 - 1.0 K/uL   Eosinophils Relative 13 (H) 0 - 5 %   Eosinophils Absolute 0.8 (H) 0.0 - 0.7 K/uL   Basophils Relative 1 0 - 1 %   Basophils Absolute 0.0 0.0 - 0.1 K/uL  Basic metabolic panel     Status: Abnormal   Collection Time: 05/08/15 11:40 PM  Result Value Ref Range   Sodium 137 135 - 145 mmol/L   Potassium 3.7  3.5 - 5.1 mmol/L   Chloride 97 (L) 101 - 111 mmol/L   CO2 27 22 - 32 mmol/L   Glucose, Bld 95 65 - 99 mg/dL   BUN 60 (H) 6 - 20 mg/dL   Creatinine, Ser 2.67 (H) 0.61 - 1.24 mg/dL   Calcium 8.8 (L) 8.9 - 10.3 mg/dL   GFR calc non Af Amer 23 (L) >60 mL/min   GFR calc Af Amer 26 (L) >60 mL/min    Comment: (NOTE) The eGFR has been calculated using the CKD EPI equation. This calculation has not been validated in all clinical situations. eGFR's persistently <60 mL/min signify possible Chronic Kidney Disease.    Anion gap 13 5 - 15  Troponin I     Status: None   Collection Time: 05/08/15 11:40 PM  Result Value Ref Range   Troponin I 0.03 <0.031 ng/mL    Comment:        NO INDICATION OF MYOCARDIAL INJURY.   Brain natriuretic peptide     Status: Abnormal   Collection Time: 05/08/15 11:40 PM  Result Value Ref Range   B Natriuretic Peptide 363.0 (H) 0.0 - 100.0 pg/mL  Phenytoin  level, total     Status: None   Collection Time: 05/08/15 11:40 PM  Result Value Ref Range   Phenytoin Lvl 13.4 10.0 - 20.0 ug/mL  MRSA PCR Screening     Status: None   Collection Time: 05/09/15  3:10 AM  Result Value Ref Range   MRSA by PCR NEGATIVE NEGATIVE    Comment:        The GeneXpert MRSA Assay (FDA approved for NASAL specimens only), is one component of a comprehensive MRSA colonization surveillance program. It is not intended to diagnose MRSA infection nor to guide or monitor treatment for MRSA infections.   Basic metabolic panel     Status: Abnormal   Collection Time: 05/09/15  6:20 AM  Result Value Ref Range   Sodium 136 135 - 145 mmol/L   Potassium 3.4 (L) 3.5 - 5.1 mmol/L   Chloride 98 (L) 101 - 111 mmol/L   CO2 25 22 - 32 mmol/L   Glucose, Bld 187 (H) 65 - 99 mg/dL   BUN 57 (H) 6 - 20 mg/dL   Creatinine, Ser 2.73 (H) 0.61 - 1.24 mg/dL   Calcium 8.5 (L) 8.9 - 10.3 mg/dL   GFR calc non Af Amer 22 (L) >60 mL/min   GFR calc Af Amer 26 (L) >60 mL/min    Comment: (NOTE) The eGFR has  been calculated using the CKD EPI equation. This calculation has not been validated in all clinical situations. eGFR's persistently <60 mL/min signify possible Chronic Kidney Disease.    Anion gap 13 5 - 15  CBC     Status: Abnormal   Collection Time: 05/09/15  6:20 AM  Result Value Ref Range   WBC 5.2 4.0 - 10.5 K/uL   RBC 3.56 (L) 4.22 - 5.81 MIL/uL   Hemoglobin 9.9 (L) 13.0 - 17.0 g/dL   HCT 31.3 (L) 39.0 - 52.0 %   MCV 87.9 78.0 - 100.0 fL   MCH 27.8 26.0 - 34.0 pg   MCHC 31.6 30.0 - 36.0 g/dL   RDW 14.9 11.5 - 15.5 %   Platelets 159 150 - 400 K/uL    ABGS No results for input(s): PHART, PO2ART, TCO2, HCO3 in the last 72 hours.  Invalid input(s): PCO2 CULTURES Recent Results (from the past 240 hour(s))  MRSA PCR Screening     Status: None   Collection Time: 05/09/15  3:10 AM  Result Value Ref Range Status   MRSA by PCR NEGATIVE NEGATIVE Final    Comment:        The GeneXpert MRSA Assay (FDA approved for NASAL specimens only), is one component of a comprehensive MRSA colonization surveillance program. It is not intended to diagnose MRSA infection nor to guide or monitor treatment for MRSA infections.    Studies/Results: Dg Chest Port 1 View  05/09/2015   CLINICAL DATA:  Acute onset of shortness of breath. Initial encounter.  EXAM: PORTABLE CHEST - 1 VIEW  COMPARISON:  Chest radiograph performed 03/06/2015  FINDINGS: The lungs are well-aerated. Patchy bilateral mid lung airspace opacities may reflect multifocal pneumonia. Underlying vascular congestion is seen. The left lung base is not well characterized. No definite pleural effusion or pneumothorax is seen.  The cardiomediastinal silhouette is mildly enlarged. No acute osseous abnormalities are seen.  IMPRESSION: Patchy bilateral mid lung airspace opacities may reflect multifocal pneumonia. Underlying vascular congestion and mild cardiomegaly noted.   Electronically Signed   By: Garald Balding M.D.   On: 05/09/2015  00:00  Medications: I have reviewed the patient's current medications.  Assesment:   Principal Problem:   CAP (community acquired pneumonia) Active Problems:   Obese   Seizure disorder   Diabetes   CKD (chronic kidney disease), stage III   Aortic insufficiency   Essential hypertension   HCAP (healthcare-associated pneumonia)    Plan:  Medications reviewed Continue combinations IV Nebulizer treatment and regular medication Pulmonary consult.    LOS: 0 days   Sorah Falkenstein 05/09/2015, 8:03 AM
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
## 2015-05-09 NOTE — Plan of Care (Signed)
Problem: Phase I Progression Outcomes Goal: First antibiotic given within 6hrs of admit Outcome: Completed/Met Date Met:  05/09/15 Loading dose of vancomycin

## 2015-05-09 NOTE — Care Management Note (Signed)
Case Management Note  Patient Details  Name: [name] [name] MRN: [id number] Date of Birth: 08/01/1946  Subjective/Objective:                  Pt admitted from home with pneumonia. Pt lives alone and will return home at discharge. Pt has home O2 with [employer], Pt also has a neb machine and cpap for home use. Pt has a daughter who is very active in the care of the pt.   Action/Plan: No CM needs noted. Pt stated that he does not think he needs any home health services at discharge.  Expected Discharge Date:                  Expected Discharge Plan:  Home/Self Care  In-House Referral:  NA  Discharge planning Services  CM Consult  Post Acute Care Choice:  NA Choice offered to:  NA  DME Arranged:    DME Agency:     HH Arranged:    HH Agency:     Status of Service:  Completed, signed off  Medicare Important Message Given:    Date Medicare IM Given:    Medicare IM give by:    Date Additional Medicare IM Given:    Additional Medicare Important Message give by:     If discussed at [place] of Stay Meetings, dates discussed:    Additional Comments:  Joylene Draft, RN 05/09/2015, 11:24 AM

## 2015-05-10 MED ORDER — TORSEMIDE 20 MG PO TABS
40.0000 mg | ORAL_TABLET | Freq: Every day | ORAL | Status: DC
  Administered 2015-05-10 – 2015-05-11 (×2): 40 mg via ORAL
  Filled 2015-05-10 (×2): qty 2

## 2015-05-10 NOTE — Clinical Documentation Improvement (Signed)
Current documentation reflects "CHF" and "Heart failure".  Pt on Lasix 40 mg daily.  Please specify the acuity (chronic, acute, acute on chronic) and type (diastolic, systolic, combined diastolic and systolic) of the patient's heart failure, if known,  this admission and document in your progress notes and carry over to the discharge summary.    Thank you, Mateo Flow, RN [phone] Clinical Documentation Specialist

## 2015-05-10 NOTE — Progress Notes (Signed)
Subjective: Patient is resting. He feels better. [name] receiving combinations of IV antibiotics. His cough and shortness of breath improving.  Objective: Vital signs in last 24 hours: Temp:  [98.4 F (36.9 C)-98.5 F (36.9 C)] 98.4 F (36.9 C) (06/02 0538) Pulse Rate:  [74-81] 74 (06/02 0538) Resp:  [17-18] 18 (06/02 0538) BP: (126-134)/(39-50) 126/39 mmHg (06/02 0538) SpO2:  [95 %-99 %] 97 % (06/02 0727) Weight change:  Last BM Date: 05/09/15  Intake/Output from previous day: 06/01 0701 - 06/02 0700 In: 1546 [P.O.:1440; I.V.:6; IV Piggyback:100] Out: 1450 [Urine:1450]  PHYSICAL EXAM General appearance: alert and no distress Resp: dullness to percussion bilaterally, rales bibasilar and wheezes bilaterally Cardio: S1, S2 normal GI: soft, non-tender; bowel sounds normal; no masses,  no organomegaly Extremities: extremities normal, atraumatic, no cyanosis or edema  Lab Results:  Results for orders placed or performed during the hospital encounter of 05/08/15 (from the past 48 hour(s))  CBC with Differential     Status: Abnormal   Collection Time: 05/08/15 11:40 PM  Result Value Ref Range   WBC 6.0 4.0 - 10.5 K/uL   RBC 3.70 (L) 4.22 - 5.81 MIL/uL   Hemoglobin 10.2 (L) 13.0 - 17.0 g/dL   HCT 32.6 (L) 39.0 - 52.0 %   MCV 88.1 78.0 - 100.0 fL   MCH 27.6 26.0 - 34.0 pg   MCHC 31.3 30.0 - 36.0 g/dL   RDW 15.0 11.5 - 15.5 %   Platelets 160 150 - 400 K/uL   Neutrophils Relative % 55 43 - 77 %   Neutro Abs 3.4 1.7 - 7.7 K/uL   Lymphocytes Relative 18 12 - 46 %   Lymphs Abs 1.1 0.7 - 4.0 K/uL   Monocytes Relative 13 (H) 3 - 12 %   Monocytes Absolute 0.8 0.1 - 1.0 K/uL   Eosinophils Relative 13 (H) 0 - 5 %   Eosinophils Absolute 0.8 (H) 0.0 - 0.7 K/uL   Basophils Relative 1 0 - 1 %   Basophils Absolute 0.0 0.0 - 0.1 K/uL  Basic metabolic panel     Status: Abnormal   Collection Time: 05/08/15 11:40 PM  Result Value Ref Range   Sodium 137 135 - 145 mmol/L   Potassium 3.7 3.5  - 5.1 mmol/L   Chloride 97 (L) 101 - 111 mmol/L   CO2 27 22 - 32 mmol/L   Glucose, Bld 95 65 - 99 mg/dL   BUN 60 (H) 6 - 20 mg/dL   Creatinine, Ser 2.67 (H) 0.61 - 1.24 mg/dL   Calcium 8.8 (L) 8.9 - 10.3 mg/dL   GFR calc non Af Amer 23 (L) >60 mL/min   GFR calc Af Amer 26 (L) >60 mL/min    Comment: (NOTE) The eGFR has been calculated using the CKD EPI equation. This calculation has not been validated in all clinical situations. eGFR's persistently <60 mL/min signify possible Chronic Kidney Disease.    Anion gap 13 5 - 15  Troponin I     Status: None   Collection Time: 05/08/15 11:40 PM  Result Value Ref Range   Troponin I 0.03 <0.031 ng/mL    Comment:        NO INDICATION OF MYOCARDIAL INJURY.   Brain natriuretic peptide     Status: Abnormal   Collection Time: 05/08/15 11:40 PM  Result Value Ref Range   B Natriuretic Peptide 363.0 (H) 0.0 - 100.0 pg/mL  Phenytoin level, total     Status: None   Collection  Time: 05/08/15 11:40 PM  Result Value Ref Range   Phenytoin Lvl 13.4 10.0 - 20.0 ug/mL  MRSA PCR Screening     Status: None   Collection Time: 05/09/15  3:10 AM  Result Value Ref Range   MRSA by PCR NEGATIVE NEGATIVE    Comment:        The GeneXpert MRSA Assay (FDA approved for NASAL specimens only), is one component of a comprehensive MRSA colonization surveillance program. It is not intended to diagnose MRSA infection nor to guide or monitor treatment for MRSA infections.   Basic metabolic panel     Status: Abnormal   Collection Time: 05/09/15  6:20 AM  Result Value Ref Range   Sodium 136 135 - 145 mmol/L   Potassium 3.4 (L) 3.5 - 5.1 mmol/L   Chloride 98 (L) 101 - 111 mmol/L   CO2 25 22 - 32 mmol/L   Glucose, Bld 187 (H) 65 - 99 mg/dL   BUN 57 (H) 6 - 20 mg/dL   Creatinine, Ser 2.73 (H) 0.61 - 1.24 mg/dL   Calcium 8.5 (L) 8.9 - 10.3 mg/dL   GFR calc non Af Amer 22 (L) >60 mL/min   GFR calc Af Amer 26 (L) >60 mL/min    Comment: (NOTE) The eGFR has been  calculated using the CKD EPI equation. This calculation has not been validated in all clinical situations. eGFR's persistently <60 mL/min signify possible Chronic Kidney Disease.    Anion gap 13 5 - 15  CBC     Status: Abnormal   Collection Time: 05/09/15  6:20 AM  Result Value Ref Range   WBC 5.2 4.0 - 10.5 K/uL   RBC 3.56 (L) 4.22 - 5.81 MIL/uL   Hemoglobin 9.9 (L) 13.0 - 17.0 g/dL   HCT 31.3 (L) 39.0 - 52.0 %   MCV 87.9 78.0 - 100.0 fL   MCH 27.8 26.0 - 34.0 pg   MCHC 31.6 30.0 - 36.0 g/dL   RDW 14.9 11.5 - 15.5 %   Platelets 159 150 - 400 K/uL    ABGS No results for input(s): PHART, PO2ART, TCO2, HCO3 in the last 72 hours.  Invalid input(s): PCO2 CULTURES Recent Results (from the past 240 hour(s))  MRSA PCR Screening     Status: None   Collection Time: 05/09/15  3:10 AM  Result Value Ref Range Status   MRSA by PCR NEGATIVE NEGATIVE Final    Comment:        The GeneXpert MRSA Assay (FDA approved for NASAL specimens only), is one component of a comprehensive MRSA colonization surveillance program. It is not intended to diagnose MRSA infection nor to guide or monitor treatment for MRSA infections.    Studies/Results: Dg Chest Port 1 View  05/09/2015   CLINICAL DATA:  Acute onset of shortness of breath. Initial encounter.  EXAM: PORTABLE CHEST - 1 VIEW  COMPARISON:  Chest radiograph performed 03/06/2015  FINDINGS: The lungs are well-aerated. Patchy bilateral mid lung airspace opacities may reflect multifocal pneumonia. Underlying vascular congestion is seen. The left lung base is not well characterized. No definite pleural effusion or pneumothorax is seen.  The cardiomediastinal silhouette is mildly enlarged. No acute osseous abnormalities are seen.  IMPRESSION: Patchy bilateral mid lung airspace opacities may reflect multifocal pneumonia. Underlying vascular congestion and mild cardiomegaly noted.   Electronically Signed   By: Garald Balding M.D.   On: 05/09/2015 00:00     Medications: I have reviewed the patient's current medications.  Assesment:  Principal Problem:   CAP (community acquired pneumonia) Active Problems:   Obese   Seizure disorder   Diabetes   CKD (chronic kidney disease), stage III   Aortic insufficiency   Essential hypertension   HCAP (healthcare-associated pneumonia)    Plan:  Medications reviewed Continue combinations IV Nebulizer treatment and regular medication Pulmonary consult is appreciated    LOS: 1 day   [name] [name] 05/10/2015, 8:06 AM

## 2015-05-10 NOTE — Progress Notes (Signed)
Subjective: He says he feels better. He has less congestion. [name] still coughing some but not coughing anything up.  Objective: Vital signs in last 24 hours: Temp:  [98.4 F (36.9 C)-98.5 F (36.9 C)] 98.4 F (36.9 C) (06/02 0538) Pulse Rate:  [74-81] 74 (06/02 0538) Resp:  [17-18] 18 (06/02 0538) BP: (126-134)/(39-50) 126/39 mmHg (06/02 0538) SpO2:  [95 %-99 %] 97 % (06/02 0727) Weight change:  Last BM Date: 05/09/15  Intake/Output from previous day: 06/01 0701 - 06/02 0700 In: 1546 [P.O.:1440; I.V.:6; IV Piggyback:100] Out: 1450 [Urine:1450]  PHYSICAL EXAM General appearance: alert, cooperative and no distress Resp: rhonchi bilaterally Cardio: regular rate and rhythm, S1, S2 normal, no murmur, click, rub or gallop GI: soft, non-tender; bowel sounds normal; no masses,  no organomegaly Extremities: extremities normal, atraumatic, no cyanosis or edema  Lab Results:  Results for orders placed or performed during the hospital encounter of 05/08/15 (from the past 48 hour(s))  CBC with Differential     Status: Abnormal   Collection Time: 05/08/15 11:40 PM  Result Value Ref Range   WBC 6.0 4.0 - 10.5 K/uL   RBC 3.70 (L) 4.22 - 5.81 MIL/uL   Hemoglobin 10.2 (L) 13.0 - 17.0 g/dL   HCT 32.6 (L) 39.0 - 52.0 %   MCV 88.1 78.0 - 100.0 fL   MCH 27.6 26.0 - 34.0 pg   MCHC 31.3 30.0 - 36.0 g/dL   RDW 15.0 11.5 - 15.5 %   Platelets 160 150 - 400 K/uL   Neutrophils Relative % 55 43 - 77 %   Neutro Abs 3.4 1.7 - 7.7 K/uL   Lymphocytes Relative 18 12 - 46 %   Lymphs Abs 1.1 0.7 - 4.0 K/uL   Monocytes Relative 13 (H) 3 - 12 %   Monocytes Absolute 0.8 0.1 - 1.0 K/uL   Eosinophils Relative 13 (H) 0 - 5 %   Eosinophils Absolute 0.8 (H) 0.0 - 0.7 K/uL   Basophils Relative 1 0 - 1 %   Basophils Absolute 0.0 0.0 - 0.1 K/uL  Basic metabolic panel     Status: Abnormal   Collection Time: 05/08/15 11:40 PM  Result Value Ref Range   Sodium 137 135 - 145 mmol/L   Potassium 3.7 3.5 - 5.1  mmol/L   Chloride 97 (L) 101 - 111 mmol/L   CO2 27 22 - 32 mmol/L   Glucose, Bld 95 65 - 99 mg/dL   BUN 60 (H) 6 - 20 mg/dL   Creatinine, Ser 2.67 (H) 0.61 - 1.24 mg/dL   Calcium 8.8 (L) 8.9 - 10.3 mg/dL   GFR calc non Af Amer 23 (L) >60 mL/min   GFR calc Af Amer 26 (L) >60 mL/min    Comment: (NOTE) The eGFR has been calculated using the CKD EPI equation. This calculation has not been validated in all clinical situations. eGFR's persistently <60 mL/min signify possible Chronic Kidney Disease.    Anion gap 13 5 - 15  Troponin I     Status: None   Collection Time: 05/08/15 11:40 PM  Result Value Ref Range   Troponin I 0.03 <0.031 ng/mL    Comment:        NO INDICATION OF MYOCARDIAL INJURY.   Brain natriuretic peptide     Status: Abnormal   Collection Time: 05/08/15 11:40 PM  Result Value Ref Range   B Natriuretic Peptide 363.0 (H) 0.0 - 100.0 pg/mL  Phenytoin level, total     Status: None  Collection Time: 05/08/15 11:40 PM  Result Value Ref Range   Phenytoin Lvl 13.4 10.0 - 20.0 ug/mL  MRSA PCR Screening     Status: None   Collection Time: 05/09/15  3:10 AM  Result Value Ref Range   MRSA by PCR NEGATIVE NEGATIVE    Comment:        The GeneXpert MRSA Assay (FDA approved for NASAL specimens only), is one component of a comprehensive MRSA colonization surveillance program. It is not intended to diagnose MRSA infection nor to guide or monitor treatment for MRSA infections.   Basic metabolic panel     Status: Abnormal   Collection Time: 05/09/15  6:20 AM  Result Value Ref Range   Sodium 136 135 - 145 mmol/L   Potassium 3.4 (L) 3.5 - 5.1 mmol/L   Chloride 98 (L) 101 - 111 mmol/L   CO2 25 22 - 32 mmol/L   Glucose, Bld 187 (H) 65 - 99 mg/dL   BUN 57 (H) 6 - 20 mg/dL   Creatinine, Ser 2.73 (H) 0.61 - 1.24 mg/dL   Calcium 8.5 (L) 8.9 - 10.3 mg/dL   GFR calc non Af Amer 22 (L) >60 mL/min   GFR calc Af Amer 26 (L) >60 mL/min    Comment: (NOTE) The eGFR has been  calculated using the CKD EPI equation. This calculation has not been validated in all clinical situations. eGFR's persistently <60 mL/min signify possible Chronic Kidney Disease.    Anion gap 13 5 - 15  CBC     Status: Abnormal   Collection Time: 05/09/15  6:20 AM  Result Value Ref Range   WBC 5.2 4.0 - 10.5 K/uL   RBC 3.56 (L) 4.22 - 5.81 MIL/uL   Hemoglobin 9.9 (L) 13.0 - 17.0 g/dL   HCT 31.3 (L) 39.0 - 52.0 %   MCV 87.9 78.0 - 100.0 fL   MCH 27.8 26.0 - 34.0 pg   MCHC 31.6 30.0 - 36.0 g/dL   RDW 14.9 11.5 - 15.5 %   Platelets 159 150 - 400 K/uL    ABGS No results for input(s): PHART, PO2ART, TCO2, HCO3 in the last 72 hours.  Invalid input(s): PCO2 CULTURES Recent Results (from the past 240 hour(s))  MRSA PCR Screening     Status: None   Collection Time: 05/09/15  3:10 AM  Result Value Ref Range Status   MRSA by PCR NEGATIVE NEGATIVE Final    Comment:        The GeneXpert MRSA Assay (FDA approved for NASAL specimens only), is one component of a comprehensive MRSA colonization surveillance program. It is not intended to diagnose MRSA infection nor to guide or monitor treatment for MRSA infections.    Studies/Results: Dg Chest Port 1 View  05/09/2015   CLINICAL DATA:  Acute onset of shortness of breath. Initial encounter.  EXAM: PORTABLE CHEST - 1 VIEW  COMPARISON:  Chest radiograph performed 03/06/2015  FINDINGS: The lungs are well-aerated. Patchy bilateral mid lung airspace opacities may reflect multifocal pneumonia. Underlying vascular congestion is seen. The left lung base is not well characterized. No definite pleural effusion or pneumothorax is seen.  The cardiomediastinal silhouette is mildly enlarged. No acute osseous abnormalities are seen.  IMPRESSION: Patchy bilateral mid lung airspace opacities may reflect multifocal pneumonia. Underlying vascular congestion and mild cardiomegaly noted.   Electronically Signed   By: Garald Balding M.D.   On: 05/09/2015 00:00     Medications:  Prior to Admission:  Prescriptions prior to admission  Medication Sig Dispense Refill Last Dose  . albuterol (PROVENTIL) (2.5 MG/3ML) 0.083% nebulizer solution Take 3 mLs (2.5 mg total) by nebulization every 4 (four) hours as needed for wheezing or shortness of breath. 75 mL 12 05/08/2015 at 1900  . amLODipine (NORVASC) 5 MG tablet Take 5 mg by mouth daily.   Past Week at Unknown time  . budesonide-formoterol (SYMBICORT) 160-4.5 MCG/ACT inhaler Inhale 2 puffs into the lungs 2 (two) times daily.   05/08/2015 at Unknown time  . cloNIDine (CATAPRES) 0.2 MG tablet Take 1 tablet (0.2 mg total) by mouth 3 (three) times daily. 90 tablet 3 05/08/2015 at Unknown time  . desmopressin (DDAVP) 0.2 MG tablet Take 1 tablet (0.2 mg total) by mouth 2 (two) times daily. 60 tablet 3 05/08/2015 at Unknown time  . dexamethasone (DECADRON) 0.5 MG tablet Take 0.5 mg by mouth daily.   05/08/2015 at Unknown time  . Fluticasone-Salmeterol (ADVAIR) 250-50 MCG/DOSE AEPB Inhale 1 puff into the lungs 2 (two) times daily.   unknown  . ipratropium (ATROVENT) 0.02 % nebulizer solution Take 0.5 mg by nebulization 4 (four) times daily.   Past Month at Unknown time  . levothyroxine (SYNTHROID, LEVOTHROID) 175 MCG tablet Take 175 mcg by mouth daily before breakfast.   05/08/2015 at Unknown time  . mometasone (NASONEX) 50 MCG/ACT nasal spray Place 2 sprays into the nose daily. 17 g 12 Past Month at Unknown time  . OXYGEN Inhale 2 L into the lungs continuous.   05/08/2015 at Unknown time  . pantoprazole (PROTONIX) 40 MG tablet Take 1 tablet (40 mg total) by mouth 2 (two) times daily before a meal. (Patient taking differently: Take 40 mg by mouth daily as needed (FOR GERD). ) 60 tablet 3 Past Month at Unknown time  . phenytoin (DILANTIN) 100 MG ER capsule Take 200-300 mg by mouth daily. Takes 200 mg on Monday and Thursdsay. On all other days of the week takes 300 mg.   05/08/2015 at Unknown time  . polyethylene glycol powder  (GLYCOLAX/MIRALAX) powder Take 17 g by mouth daily. May decrease to 17 g three times a week if needed. (Patient taking differently: Take 17 g by mouth daily. May decrease to 17 g three times a week if needed.) 255 g 3 05/07/2015 at Unknown time  . simvastatin (ZOCOR) 80 MG tablet Take 80 mg by mouth daily.    Past Week at Unknown time  . torsemide (DEMADEX) 20 MG tablet Take 40 mg by mouth daily.   05/08/2015 at Unknown time  . Vitamin D, Ergocalciferol, (DRISDOL) 50000 UNITS CAPS capsule Take 50,000 Units by mouth every 7 (seven) days.   Past Week at Unknown time   Scheduled: . antiseptic oral rinse  7 mL Mouth Rinse BID  . atorvastatin  40 mg Oral q1800  . budesonide-formoterol  2 puff Inhalation BID  . cloNIDine  0.2 mg Oral TID  . desmopressin  0.2 mg Oral BID  . furosemide  40 mg Intravenous Daily  . heparin  5,000 Units Subcutaneous 3 times per day  . ipratropium  0.5 mg Nebulization QID  . levothyroxine  175 mcg Oral QAC breakfast  . phenytoin  200 mg Oral Once per day on Mon Thu  . phenytoin  300 mg Oral Once per day on Sun Tue Wed Fri Sat  . piperacillin-tazobactam (ZOSYN)  IV  3.375 g Intravenous Q8H  . polyethylene glycol  17 g Oral Daily  . potassium chloride SA  20 mEq Oral BID  .  sodium bicarbonate  650 mg Oral Daily  . sodium chloride  3 mL Intravenous Q12H  . torsemide  40 mg Oral Daily  . vancomycin  1,500 mg Intravenous Q48H   Continuous: . albuterol Stopped (05/09/15 0146)   [id number], pantoprazole  Assesment: He was admitted with healthcare associated pneumonia. He seems to be improving fairly rapidly and says he feels okay now his other multiple medical problems appear to be stable. Principal Problem:   CAP (community acquired pneumonia) Active Problems:   Obese   Seizure disorder   Diabetes   CKD (chronic kidney disease), stage III   Aortic insufficiency   Essential hypertension   HCAP (healthcare-associated pneumonia)    Plan: Continue current  treatments.    LOS: 1 day   [name] [name] L 05/10/2015, 8:29 AM

## 2015-05-11 ENCOUNTER — Ambulatory Visit: Payer: Medicare HMO | Admitting: Internal Medicine

## 2015-05-11 LAB — BASIC METABOLIC PANEL
Anion gap: 10 (ref 5–15)
BUN: 55 mg/dL — ABNORMAL HIGH (ref 6–20)
CALCIUM: 8.6 mg/dL — AB (ref 8.9–10.3)
CO2: 27 mmol/L (ref 22–32)
Chloride: 103 mmol/L (ref 101–111)
Creatinine, Ser: 2.37 mg/dL — ABNORMAL HIGH (ref 0.61–1.24)
GFR calc non Af Amer: 26 mL/min — ABNORMAL LOW (ref >60)
GFR, EST AFRICAN AMERICAN: 30 mL/min — AB (ref >60)
GLUCOSE: 96 mg/dL (ref 65–99)
Potassium: 4.5 mmol/L (ref 3.5–5.1)
SODIUM: 140 mmol/L (ref 135–145)

## 2015-05-11 MED ORDER — LEVOFLOXACIN 750 MG PO TABS: 750.0000 mg | ORAL_TABLET | Freq: Every day | ORAL | Status: DC

## 2015-05-11 NOTE — Progress Notes (Signed)
[name] being discharged home today which I think is appropriate. He says he is breathing well and his chest is clear. He's going to be discharged home on Levaquin and he will need a chest x-ray in approximately a month to make sure that he clears his infiltrate.  Discussed with Dr. Legrand Rams

## 2015-05-11 NOTE — Care Management Note (Signed)
Case Management Note  Patient Details  Name: [name] [name] MRN: [id number] Date of Birth: 03/20/1946  Subjective/Objective:                    Action/Plan:   Expected Discharge Date:                  Expected Discharge Plan:  Home/Self Care  In-House Referral:  NA  Discharge planning Services  CM Consult  Post Acute Care Choice:  NA Choice offered to:  NA  DME Arranged:    DME Agency:     HH Arranged:    HH Agency:     Status of Service:  Completed, signed off  Medicare Important Message Given:  Yes Date Medicare IM Given:  05/11/15 Medicare IM give by:  Christinia Gully, RN BSN CM Date Additional Medicare IM Given:    Additional Medicare Important Message give by:     If discussed at [place] of Stay Meetings, dates discussed:    Additional Comments: Pt discharged home today. No CM needs noted. Pt refuses any home health services at this time. Christinia Gully [place], RN 05/11/2015, 9:20 AM

## 2015-05-11 NOTE — Progress Notes (Signed)
Pt and family received medication and discharge instructions. Pt. Stated no further questions. Stable at discharge. Pt. Had O2 machine from home and was escorted via W/C to main entrance by nurse.

## 2015-05-11 NOTE — Discharge Summary (Addendum)
Physician Discharge Summary  Patient ID: [name] [name] MRN: [id number] DOB/AGE: May 12, 1946 69 y.o. Primary Care Physician:Arya Boxley, MD Admit date: 05/08/2015 Discharge date: 05/11/2015    Discharge Diagnoses:   Principal Problem:   CAP (community acquired pneumonia) Active Problems:   Obese   Seizure disorder   Diabetes   CKD (chronic kidney disease), stage III   Aortic insufficiency   Essential hypertension   HCAP (healthcare-associated pneumonia) Chronic Systolic CHF    Medication List    TAKE these medications        albuterol (2.5 MG/3ML) 0.083% nebulizer solution  Commonly known as:  PROVENTIL  Take 3 mLs (2.5 mg total) by nebulization every 4 (four) hours as needed for wheezing or shortness of breath.     amLODipine 5 MG tablet  Commonly known as:  NORVASC  Take 5 mg by mouth daily.     budesonide-formoterol 160-4.5 MCG/ACT inhaler  Commonly known as:  SYMBICORT  Inhale 2 puffs into the lungs 2 (two) times daily.     cloNIDine 0.2 MG tablet  Commonly known as:  CATAPRES  Take 1 tablet (0.2 mg total) by mouth 3 (three) times daily.     desmopressin 0.2 MG tablet  Commonly known as:  DDAVP  Take 1 tablet (0.2 mg total) by mouth 2 (two) times daily.     dexamethasone 0.5 MG tablet  Commonly known as:  DECADRON  Take 0.5 mg by mouth daily.     Fluticasone-Salmeterol 250-50 MCG/DOSE Aepb  Commonly known as:  ADVAIR  Inhale 1 puff into the lungs 2 (two) times daily.     ipratropium 0.02 % nebulizer solution  Commonly known as:  ATROVENT  Take 0.5 mg by nebulization 4 (four) times daily.     levofloxacin 750 MG tablet  Commonly known as:  LEVAQUIN  Take 1 tablet (750 mg total) by mouth daily.     levothyroxine 175 MCG tablet  Commonly known as:  SYNTHROID, LEVOTHROID  Take 175 mcg by mouth daily before breakfast.     mometasone 50 MCG/ACT nasal spray  Commonly known as:  NASONEX  Place 2 sprays into the nose daily.     OXYGEN  Inhale 2 L into  the lungs continuous.     pantoprazole 40 MG tablet  Commonly known as:  PROTONIX  Take 1 tablet (40 mg total) by mouth 2 (two) times daily before a meal.     phenytoin 100 MG ER capsule  Commonly known as:  DILANTIN  Take 200-300 mg by mouth daily. Takes 200 mg on Monday and Thursdsay. On all other days of the week takes 300 mg.     polyethylene glycol powder powder  Commonly known as:  GLYCOLAX/MIRALAX  Take 17 g by mouth daily. May decrease to 17 g three times a week if needed.     simvastatin 80 MG tablet  Commonly known as:  ZOCOR  Take 80 mg by mouth daily.     torsemide 20 MG tablet  Commonly known as:  DEMADEX  Take 40 mg by mouth daily.     Vitamin D (Ergocalciferol) 50000 UNITS Caps capsule  Commonly known as:  DRISDOL  Take 50,000 Units by mouth every 7 (seven) days.        Discharged Condition: improved    Consults: pulmonary  Significant Diagnostic Studies: Dg Chest Port 1 View  05/09/2015   CLINICAL DATA:  Acute onset of shortness of breath. Initial encounter.  EXAM: PORTABLE CHEST - 1 VIEW  COMPARISON:  Chest radiograph performed 03/06/2015  FINDINGS: The lungs are well-aerated. Patchy bilateral mid lung airspace opacities may reflect multifocal pneumonia. Underlying vascular congestion is seen. The left lung base is not well characterized. No definite pleural effusion or pneumothorax is seen.  The cardiomediastinal silhouette is mildly enlarged. No acute osseous abnormalities are seen.  IMPRESSION: Patchy bilateral mid lung airspace opacities may reflect multifocal pneumonia. Underlying vascular congestion and mild cardiomegaly noted.   Electronically Signed   By: Garald Balding M.D.   On: 05/09/2015 00:00    Lab Results: Basic Metabolic Panel:  Recent Labs  05/09/15 0620 05/11/15 0559  NA 136 140  K 3.4* 4.5  CL 98* 103  CO2 25 27  GLUCOSE 187* 96  BUN 57* 55*  CREATININE 2.73* 2.37*  CALCIUM 8.5* 8.6*   Liver Function Tests: No results for  input(s): AST, ALT, ALKPHOS, BILITOT, PROT, ALBUMIN in the last 72 hours.   CBC:  Recent Labs  05/08/15 2340 05/09/15 0620  WBC 6.0 5.2  NEUTROABS 3.4  --   HGB 10.2* 9.9*  HCT 32.6* 31.3*  MCV 88.1 87.9  PLT 160 159    Recent Results (from the past 240 hour(s))  MRSA PCR Screening     Status: None   Collection Time: 05/09/15  3:10 AM  Result Value Ref Range Status   MRSA by PCR NEGATIVE NEGATIVE Final    Comment:        The GeneXpert MRSA Assay (FDA approved for NASAL specimens only), is one component of a comprehensive MRSA colonization surveillance program. It is not intended to diagnose MRSA infection nor to guide or monitor treatment for MRSA infections.      Hospital Course:   This is a 69 years old male with history of multiple medical illnesses was admitted due to health care associated Pneumonia. He was in out of hospital due to COPD exacerbation. Patient was treated with combinations of Iv antibiotics and was evaluated by pulmonologist. Patient improved and discharged on oral antibiotics.  Discharge Exam: Blood pressure 140/46, pulse 73, temperature 98.7 F (37.1 C), temperature source Oral, resp. rate 18, height 6\' 1"  (1.854 m), weight 108.455 kg (239 lb 1.6 oz), SpO2 98 %.   Disposition:  home        Follow-up Information    Follow up with [facility], MD In 2 weeks.   Specialty:  Internal Medicine   Contact information:   [place] [address] [phone]       Signed: [name]   05/11/2015, 8:23 AM

## 2015-05-14 ENCOUNTER — Ambulatory Visit (HOSPITAL_COMMUNITY)
Admission: RE | Admit: 2015-05-14 | Discharge: 2015-05-14 | Disposition: A | Discharge status: Home / Self Care | Payer: Medicare HMO | Source: Ambulatory Visit | Attending: Internal Medicine | Admitting: Internal Medicine

## 2015-05-14 ENCOUNTER — Other Ambulatory Visit (HOSPITAL_COMMUNITY): Payer: Self-pay | Admitting: Internal Medicine

## 2015-05-14 DIAGNOSIS — J189 Pneumonia, unspecified organism: Secondary | ICD-10-CM | POA: Diagnosis present

## 2015-05-14 DIAGNOSIS — R05 Cough: Secondary | ICD-10-CM | POA: Diagnosis not present

## 2015-05-29 ENCOUNTER — Other Ambulatory Visit: Payer: Self-pay | Admitting: Cardiovascular Disease

## 2015-05-30 ENCOUNTER — Telehealth: Payer: Self-pay | Admitting: Internal Medicine

## 2015-05-30 NOTE — Telephone Encounter (Signed)
I talked to the pt, explained to him why he was scheduled for an office visit. He said he was doing fine right now and he didn't want to come in. He wants to cancel his ov. I asked him to call us if he had any problems and he said he would.   Routing to RMR for [employer] to [place] to cancel OV

## 2015-05-30 NOTE — Telephone Encounter (Signed)
OV cancelled °

## 2015-05-30 NOTE — Telephone Encounter (Signed)
Noted  

## 2015-05-30 NOTE — Telephone Encounter (Signed)
Pt called today asking why he has OV here. I told him that [facility] had recommended that he follow up with RMR in 2 months for his GI Bleed and he has OV on Monday at 8 with RMR. Patient is wanting to cancel and doesn't see the need in coming, but I offered for him to have the nurse to call and go over what the recommendations were while he was in the hospital since he didn't remember. He agreed and said to hold on to [place] until he spoke with nurse first. Please call him at [phone]

## 2015-06-04 ENCOUNTER — Ambulatory Visit: Payer: Medicare HMO | Admitting: Internal Medicine

## 2015-06-25 ENCOUNTER — Observation Stay (HOSPITAL_COMMUNITY)
Admission: EM | Admit: 2015-06-25 | Discharge: 2015-06-27 | Disposition: A | Discharge status: Home / Self Care | Payer: Medicare HMO | Attending: Internal Medicine | Admitting: Internal Medicine

## 2015-06-25 ENCOUNTER — Emergency Department (HOSPITAL_COMMUNITY): Payer: Medicare HMO

## 2015-06-25 ENCOUNTER — Encounter (HOSPITAL_COMMUNITY): Payer: Self-pay | Admitting: *Deleted

## 2015-06-25 DIAGNOSIS — I10 Essential (primary) hypertension: Secondary | ICD-10-CM | POA: Diagnosis present

## 2015-06-25 DIAGNOSIS — R011 Cardiac murmur, unspecified: Secondary | ICD-10-CM | POA: Diagnosis not present

## 2015-06-25 DIAGNOSIS — I129 Hypertensive chronic kidney disease with stage 1 through stage 4 chronic kidney disease, or unspecified chronic kidney disease: Secondary | ICD-10-CM | POA: Insufficient documentation

## 2015-06-25 DIAGNOSIS — R110 Nausea: Secondary | ICD-10-CM | POA: Insufficient documentation

## 2015-06-25 DIAGNOSIS — I509 Heart failure, unspecified: Secondary | ICD-10-CM | POA: Insufficient documentation

## 2015-06-25 DIAGNOSIS — Z7952 Long term (current) use of systemic steroids: Secondary | ICD-10-CM | POA: Diagnosis not present

## 2015-06-25 DIAGNOSIS — G40909 Epilepsy, unspecified, not intractable, without status epilepticus: Secondary | ICD-10-CM | POA: Insufficient documentation

## 2015-06-25 DIAGNOSIS — G4730 Sleep apnea, unspecified: Secondary | ICD-10-CM | POA: Insufficient documentation

## 2015-06-25 DIAGNOSIS — J441 Chronic obstructive pulmonary disease with (acute) exacerbation: Principal | ICD-10-CM | POA: Insufficient documentation

## 2015-06-25 DIAGNOSIS — E785 Hyperlipidemia, unspecified: Secondary | ICD-10-CM | POA: Diagnosis not present

## 2015-06-25 DIAGNOSIS — Z6841 Body Mass Index (BMI) 40.0 and over, adult: Secondary | ICD-10-CM | POA: Diagnosis not present

## 2015-06-25 DIAGNOSIS — Z79899 Other long term (current) drug therapy: Secondary | ICD-10-CM | POA: Insufficient documentation

## 2015-06-25 DIAGNOSIS — Z872 Personal history of diseases of the skin and subcutaneous tissue: Secondary | ICD-10-CM | POA: Insufficient documentation

## 2015-06-25 DIAGNOSIS — N189 Chronic kidney disease, unspecified: Secondary | ICD-10-CM | POA: Diagnosis not present

## 2015-06-25 DIAGNOSIS — R1012 Left upper quadrant pain: Secondary | ICD-10-CM | POA: Insufficient documentation

## 2015-06-25 DIAGNOSIS — R0602 Shortness of breath: Secondary | ICD-10-CM

## 2015-06-25 DIAGNOSIS — Z87891 Personal history of nicotine dependence: Secondary | ICD-10-CM | POA: Diagnosis not present

## 2015-06-25 DIAGNOSIS — Z905 Acquired absence of kidney: Secondary | ICD-10-CM | POA: Diagnosis not present

## 2015-06-25 DIAGNOSIS — N183 Chronic kidney disease, stage 3 (moderate): Secondary | ICD-10-CM | POA: Diagnosis not present

## 2015-06-25 DIAGNOSIS — E1122 Type 2 diabetes mellitus with diabetic chronic kidney disease: Secondary | ICD-10-CM | POA: Diagnosis not present

## 2015-06-25 DIAGNOSIS — I351 Nonrheumatic aortic (valve) insufficiency: Secondary | ICD-10-CM | POA: Diagnosis present

## 2015-06-25 DIAGNOSIS — E039 Hypothyroidism, unspecified: Secondary | ICD-10-CM | POA: Insufficient documentation

## 2015-06-25 DIAGNOSIS — E119 Type 2 diabetes mellitus without complications: Secondary | ICD-10-CM | POA: Diagnosis not present

## 2015-06-25 DIAGNOSIS — R079 Chest pain, unspecified: Secondary | ICD-10-CM

## 2015-06-25 HISTORY — DX: Nonrheumatic aortic (valve) insufficiency: I35.1

## 2015-06-25 HISTORY — DX: Type 2 diabetes mellitus without complications: E11.9

## 2015-06-25 HISTORY — DX: Pulmonary hypertension, unspecified: I27.20

## 2015-06-25 HISTORY — DX: Chronic kidney disease, stage 4 (severe): N18.4

## 2015-06-25 HISTORY — DX: Chronic respiratory failure, unspecified whether with hypoxia or hypercapnia: J96.10

## 2015-06-25 HISTORY — DX: Hypothyroidism, unspecified: E03.9

## 2015-06-25 HISTORY — DX: Candidal esophagitis: B37.81

## 2015-06-25 HISTORY — DX: Chronic systolic (congestive) heart failure: I50.22

## 2015-06-25 HISTORY — DX: Unspecified right bundle-branch block: I45.10

## 2015-06-25 HISTORY — DX: Obesity, unspecified: E66.9

## 2015-06-25 LAB — CBC WITH DIFFERENTIAL/PLATELET
Basophils Absolute: 0 10*3/uL (ref 0.0–0.1)
Basophils Relative: 1 % (ref 0–1)
Eosinophils Absolute: 0.6 10*3/uL (ref 0.0–0.7)
Eosinophils Relative: 8 % — ABNORMAL HIGH (ref 0–5)
HCT: 30.5 % — ABNORMAL LOW (ref 39.0–52.0)
Hemoglobin: 9.4 g/dL — ABNORMAL LOW (ref 13.0–17.0)
Lymphocytes Relative: 15 % (ref 12–46)
Lymphs Abs: 1 10*3/uL (ref 0.7–4.0)
MCH: 26.1 pg (ref 26.0–34.0)
MCHC: 30.8 g/dL (ref 30.0–36.0)
MCV: 84.7 fL (ref 78.0–100.0)
Monocytes Absolute: 0.9 10*3/uL (ref 0.1–1.0)
Monocytes Relative: 13 % — ABNORMAL HIGH (ref 3–12)
NEUTROS PCT: 63 % (ref 43–77)
Neutro Abs: 4.2 10*3/uL (ref 1.7–7.7)
Platelets: 171 10*3/uL (ref 150–400)
RBC: 3.6 MIL/uL — AB (ref 4.22–5.81)
RDW: 15.8 % — ABNORMAL HIGH (ref 11.5–15.5)
WBC: 6.6 10*3/uL (ref 4.0–10.5)

## 2015-06-25 LAB — COMPREHENSIVE METABOLIC PANEL
ALT: 10 U/L — AB (ref 17–63)
AST: 12 U/L — ABNORMAL LOW (ref 15–41)
Albumin: 3.4 g/dL — ABNORMAL LOW (ref 3.5–5.0)
Alkaline Phosphatase: 57 U/L (ref 38–126)
Anion gap: 9 (ref 5–15)
BUN: 41 mg/dL — ABNORMAL HIGH (ref 6–20)
CALCIUM: 8 mg/dL — AB (ref 8.9–10.3)
CO2: 28 mmol/L (ref 22–32)
Chloride: 102 mmol/L (ref 101–111)
Creatinine, Ser: 2.37 mg/dL — ABNORMAL HIGH (ref 0.61–1.24)
GFR calc non Af Amer: 26 mL/min — ABNORMAL LOW (ref >60)
GFR, EST AFRICAN AMERICAN: 30 mL/min — AB (ref >60)
Glucose, Bld: 87 mg/dL (ref 65–99)
Potassium: 3.5 mmol/L (ref 3.5–5.1)
Sodium: 139 mmol/L (ref 135–145)
Total Bilirubin: 0.4 mg/dL (ref 0.3–1.2)
Total Protein: 7.3 g/dL (ref 6.5–8.1)

## 2015-06-25 LAB — URINALYSIS, ROUTINE W REFLEX MICROSCOPIC
Bilirubin Urine: NEGATIVE
GLUCOSE, UA: NEGATIVE mg/dL
HGB URINE DIPSTICK: NEGATIVE
Ketones, ur: NEGATIVE mg/dL
LEUKOCYTES UA: NEGATIVE
Nitrite: NEGATIVE
PROTEIN: NEGATIVE mg/dL
SPECIFIC GRAVITY, URINE: 1.01 (ref 1.005–1.030)
Urobilinogen, UA: 0.2 mg/dL (ref 0.0–1.0)
pH: 5.5 (ref 5.0–8.0)

## 2015-06-25 LAB — TROPONIN I

## 2015-06-25 LAB — GLUCOSE, CAPILLARY
Glucose-Capillary: 87 mg/dL (ref 65–99)
Glucose-Capillary: 103 mg/dL — ABNORMAL HIGH (ref 65–99)

## 2015-06-25 LAB — LIPASE, BLOOD: Lipase: 51 U/L (ref 22–51)

## 2015-06-25 MED ORDER — FLUTICASONE PROPIONATE 50 MCG/ACT NA SUSP
2.0000 | Freq: Every day | NASAL | Status: DC
  Administered 2015-06-25 – 2015-06-26 (×2): 2 via NASAL
  Filled 2015-06-25: qty 16

## 2015-06-25 MED ORDER — MOMETASONE FURO-FORMOTEROL FUM 100-5 MCG/ACT IN AERO
2.0000 | INHALATION_SPRAY | Freq: Two times a day (BID) | RESPIRATORY_TRACT | Status: DC
  Administered 2015-06-25 – 2015-06-27 (×4): 2 via RESPIRATORY_TRACT
  Filled 2015-06-25: qty 8.8

## 2015-06-25 MED ORDER — ATORVASTATIN CALCIUM 40 MG PO TABS
40.0000 mg | ORAL_TABLET | Freq: Every day | ORAL | Status: DC
  Administered 2015-06-25 – 2015-06-26 (×2): 40 mg via ORAL
  Filled 2015-06-25 (×2): qty 1

## 2015-06-25 MED ORDER — IPRATROPIUM-ALBUTEROL 0.5-2.5 (3) MG/3ML IN SOLN
3.0000 mL | Freq: Once | RESPIRATORY_TRACT | Status: AC
  Administered 2015-06-25: 3 mL via RESPIRATORY_TRACT
  Filled 2015-06-25: qty 3

## 2015-06-25 MED ORDER — ONDANSETRON HCL 4 MG/2ML IJ SOLN
4.0000 mg | Freq: Four times a day (QID) | INTRAMUSCULAR | Status: DC | PRN
  Administered 2015-06-25: 4 mg via INTRAVENOUS
  Filled 2015-06-25: qty 2

## 2015-06-25 MED ORDER — INSULIN ASPART 100 UNIT/ML ~~LOC~~ SOLN
0.0000 [IU] | Freq: Three times a day (TID) | SUBCUTANEOUS | Status: DC
Start: 2015-06-25 — End: 2015-06-27

## 2015-06-25 MED ORDER — INSULIN ASPART 100 UNIT/ML ~~LOC~~ SOLN
0.0000 [IU] | Freq: Every day | SUBCUTANEOUS | Status: DC
Start: 2015-06-25 — End: 2015-06-27

## 2015-06-25 MED ORDER — ALBUTEROL SULFATE (2.5 MG/3ML) 0.083% IN NEBU: 2.5000 mg | INHALATION_SOLUTION | RESPIRATORY_TRACT | Status: DC | PRN

## 2015-06-25 MED ORDER — ENOXAPARIN SODIUM 40 MG/0.4ML ~~LOC~~ SOLN
40.0000 mg | SUBCUTANEOUS | Status: DC
  Administered 2015-06-25 – 2015-06-26 (×2): 40 mg via SUBCUTANEOUS
  Filled 2015-06-25 (×2): qty 0.4

## 2015-06-25 MED ORDER — CLONIDINE HCL 0.2 MG PO TABS
0.2000 mg | ORAL_TABLET | Freq: Three times a day (TID) | ORAL | Status: DC
  Administered 2015-06-25 – 2015-06-27 (×6): 0.2 mg via ORAL
  Filled 2015-06-25 (×6): qty 1

## 2015-06-25 MED ORDER — PHENYTOIN SODIUM EXTENDED 100 MG PO CAPS
300.0000 mg | ORAL_CAPSULE | ORAL | Status: DC
  Administered 2015-06-26: 300 mg via ORAL
  Filled 2015-06-25 (×2): qty 3

## 2015-06-25 MED ORDER — PANTOPRAZOLE SODIUM 40 MG PO TBEC
40.0000 mg | DELAYED_RELEASE_TABLET | Freq: Two times a day (BID) | ORAL | Status: DC
  Administered 2015-06-25 – 2015-06-27 (×4): 40 mg via ORAL
  Filled 2015-06-25 (×4): qty 1

## 2015-06-25 MED ORDER — VITAMIN D (ERGOCALCIFEROL) 1.25 MG (50000 UNIT) PO CAPS
50000.0000 [IU] | ORAL_CAPSULE | ORAL | Status: DC
Start: 2015-07-01 — End: 2015-06-27

## 2015-06-25 MED ORDER — LEVOTHYROXINE SODIUM 75 MCG PO TABS
175.0000 ug | ORAL_TABLET | Freq: Every day | ORAL | Status: DC
  Administered 2015-06-26 – 2015-06-27 (×2): 175 ug via ORAL
  Filled 2015-06-25 (×4): qty 1

## 2015-06-25 MED ORDER — DEXAMETHASONE 0.5 MG PO TABS
0.5000 mg | ORAL_TABLET | Freq: Every day | ORAL | Status: DC
  Administered 2015-06-25 – 2015-06-27 (×3): 0.5 mg via ORAL
  Filled 2015-06-25 (×7): qty 1

## 2015-06-25 MED ORDER — AMLODIPINE BESYLATE 5 MG PO TABS
5.0000 mg | ORAL_TABLET | Freq: Every day | ORAL | Status: DC
  Administered 2015-06-25 – 2015-06-27 (×3): 5 mg via ORAL
  Filled 2015-06-25 (×3): qty 1

## 2015-06-25 MED ORDER — ACETAMINOPHEN 325 MG PO TABS: 650.0000 mg | ORAL_TABLET | ORAL | Status: DC | PRN

## 2015-06-25 MED ORDER — TIOTROPIUM BROMIDE MONOHYDRATE 18 MCG IN CAPS
18.0000 ug | ORAL_CAPSULE | Freq: Every day | RESPIRATORY_TRACT | Status: DC
  Administered 2015-06-26 – 2015-06-27 (×2): 18 ug via RESPIRATORY_TRACT
  Filled 2015-06-25: qty 5

## 2015-06-25 MED ORDER — BUDESONIDE-FORMOTEROL FUMARATE 160-4.5 MCG/ACT IN AERO
2.0000 | INHALATION_SPRAY | Freq: Two times a day (BID) | RESPIRATORY_TRACT | Status: DC
  Administered 2015-06-25 – 2015-06-27 (×4): 2 via RESPIRATORY_TRACT
  Filled 2015-06-25: qty 6

## 2015-06-25 MED ORDER — TORSEMIDE 20 MG PO TABS
40.0000 mg | ORAL_TABLET | Freq: Every day | ORAL | Status: DC
  Administered 2015-06-25 – 2015-06-27 (×3): 40 mg via ORAL
  Filled 2015-06-25 (×3): qty 2

## 2015-06-25 MED ORDER — FENTANYL CITRATE (PF) 100 MCG/2ML IJ SOLN
25.0000 ug | Freq: Once | INTRAMUSCULAR | Status: AC
  Administered 2015-06-25: 25 ug via INTRAVENOUS
  Filled 2015-06-25: qty 2

## 2015-06-25 MED ORDER — PHENYTOIN SODIUM EXTENDED 100 MG PO CAPS: 200.0000 mg | ORAL_CAPSULE | Freq: Every day | ORAL | Status: DC

## 2015-06-25 MED ORDER — PHENYTOIN SODIUM EXTENDED 100 MG PO CAPS
200.0000 mg | ORAL_CAPSULE | ORAL | Status: DC
  Administered 2015-06-25: 200 mg via ORAL
  Filled 2015-06-25: qty 2

## 2015-06-25 MED ORDER — POLYETHYLENE GLYCOL 3350 17 G PO PACK
17.0000 g | PACK | Freq: Every day | ORAL | Status: DC
Start: 2015-06-25 — End: 2015-06-27
  Administered 2015-06-25 – 2015-06-27 (×3): 17 g via ORAL
  Filled 2015-06-25 (×3): qty 1
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
## 2015-06-25 NOTE — ED Notes (Signed)
Patient c/o being short of breath since yesterday, wears 2 L oxygen " most of the time". Hx of COPD. 02 sats 100% on 2 L during triage.

## 2015-06-26 ENCOUNTER — Encounter (HOSPITAL_COMMUNITY): Payer: Self-pay | Admitting: Physician Assistant

## 2015-06-26 DIAGNOSIS — J441 Chronic obstructive pulmonary disease with (acute) exacerbation: Secondary | ICD-10-CM | POA: Diagnosis not present

## 2015-06-26 DIAGNOSIS — R1012 Left upper quadrant pain: Secondary | ICD-10-CM | POA: Diagnosis not present

## 2015-06-26 DIAGNOSIS — I10 Essential (primary) hypertension: Secondary | ICD-10-CM

## 2015-06-26 DIAGNOSIS — N183 Chronic kidney disease, stage 3 (moderate): Secondary | ICD-10-CM | POA: Diagnosis not present

## 2015-06-26 DIAGNOSIS — R079 Chest pain, unspecified: Secondary | ICD-10-CM | POA: Diagnosis not present

## 2015-06-26 DIAGNOSIS — I351 Nonrheumatic aortic (valve) insufficiency: Secondary | ICD-10-CM

## 2015-06-26 DIAGNOSIS — R011 Cardiac murmur, unspecified: Secondary | ICD-10-CM | POA: Diagnosis not present

## 2015-06-26 DIAGNOSIS — R0789 Other chest pain: Secondary | ICD-10-CM | POA: Diagnosis not present

## 2015-06-26 LAB — GLUCOSE, CAPILLARY
GLUCOSE-CAPILLARY: 120 mg/dL — AB (ref 65–99)
Glucose-Capillary: 82 mg/dL (ref 65–99)
Glucose-Capillary: 110 mg/dL — ABNORMAL HIGH (ref 65–99)
Glucose-Capillary: 120 mg/dL — ABNORMAL HIGH (ref 65–99)

## 2015-06-26 LAB — MRSA PCR SCREENING: MRSA by PCR: NEGATIVE
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
## 2015-06-26 NOTE — Progress Notes (Signed)
Subjective:  Patient was admitted yesterday due to left sided chest pain. He had chest pain since Sunday. The pain subside last night. [name] feeling better now.   Objective: Vital signs in last 24 hours: Temp:  [98.6 F (37 C)-99 F (37.2 C)] 98.6 F (37 C) (07/19 0528) Pulse Rate:  [81-86] 84 (07/19 0528) Resp:  [11-20] 18 (07/19 0528) BP: (124-139)/(41-54) 132/54 mmHg (07/19 0528) SpO2:  [98 %-100 %] 98 % (07/19 0734) Weight:  [113.399 kg (250 lb)-114.3 kg (251 lb 15.8 oz)] 114.3 kg (251 lb 15.8 oz) (07/19 0528) Weight change:  Last BM Date: 06/24/15  Intake/Output from previous day: 07/18 0701 - 07/19 0700 In: 240 [P.O.:240] Out: 1500 [Urine:1500]  PHYSICAL EXAM General appearance: alert and moderately obese Resp: diminished breath sounds bilaterally and rhonchi bilaterally Cardio: S1, S2 normal GI: soft, non-tender; bowel sounds normal; no masses,  no organomegaly Extremities: extremities normal, atraumatic, no cyanosis or edema  Lab Results:  Results for orders placed or performed during the hospital encounter of 06/25/15 (from the past 48 hour(s))  CBC with Differential/Platelet     Status: Abnormal   Collection Time: 06/25/15 12:14 PM  Result Value Ref Range   WBC 6.6 4.0 - 10.5 K/uL   RBC 3.60 (L) 4.22 - 5.81 MIL/uL   Hemoglobin 9.4 (L) 13.0 - 17.0 g/dL   HCT 30.5 (L) 39.0 - 52.0 %   MCV 84.7 78.0 - 100.0 fL   MCH 26.1 26.0 - 34.0 pg   MCHC 30.8 30.0 - 36.0 g/dL   RDW 15.8 (H) 11.5 - 15.5 %   Platelets 171 150 - 400 K/uL   Neutrophils Relative % 63 43 - 77 %   Neutro Abs 4.2 1.7 - 7.7 K/uL   Lymphocytes Relative 15 12 - 46 %   Lymphs Abs 1.0 0.7 - 4.0 K/uL   Monocytes Relative 13 (H) 3 - 12 %   Monocytes Absolute 0.9 0.1 - 1.0 K/uL   Eosinophils Relative 8 (H) 0 - 5 %   Eosinophils Absolute 0.6 0.0 - 0.7 K/uL   Basophils Relative 1 0 - 1 %   Basophils Absolute 0.0 0.0 - 0.1 K/uL  Lipase, blood     Status: None   Collection Time: 06/25/15  1:20 PM   Result Value Ref Range   Lipase 51 22 - 51 U/L  Comprehensive metabolic panel     Status: Abnormal   Collection Time: 06/25/15  1:20 PM  Result Value Ref Range   Sodium 139 135 - 145 mmol/L   Potassium 3.5 3.5 - 5.1 mmol/L   Chloride 102 101 - 111 mmol/L   CO2 28 22 - 32 mmol/L   Glucose, Bld 87 65 - 99 mg/dL   BUN 41 (H) 6 - 20 mg/dL   Creatinine, Ser 2.37 (H) 0.61 - 1.24 mg/dL   Calcium 8.0 (L) 8.9 - 10.3 mg/dL   Total Protein 7.3 6.5 - 8.1 g/dL   Albumin 3.4 (L) 3.5 - 5.0 g/dL   AST 12 (L) 15 - 41 U/L   ALT 10 (L) 17 - 63 U/L   Alkaline Phosphatase 57 38 - 126 U/L   Total Bilirubin 0.4 0.3 - 1.2 mg/dL   GFR calc non Af Amer 26 (L) >60 mL/min   GFR calc Af Amer 30 (L) >60 mL/min    Comment: (NOTE) The eGFR has been calculated using the CKD EPI equation. This calculation has not been validated in all clinical situations. eGFR's persistently <60  mL/min signify possible Chronic Kidney Disease.    Anion gap 9 5 - 15  Troponin I     Status: None   Collection Time: 06/25/15  1:20 PM  Result Value Ref Range   Troponin I <0.03 <0.031 ng/mL    Comment:        NO INDICATION OF MYOCARDIAL INJURY.   Urinalysis, Routine w reflex microscopic (not at [facility])     Status: None   Collection Time: 06/25/15  1:53 PM  Result Value Ref Range   Color, Urine YELLOW YELLOW   APPearance CLEAR CLEAR   Specific Gravity, Urine 1.010 1.005 - 1.030   pH 5.5 5.0 - 8.0   Glucose, UA NEGATIVE NEGATIVE mg/dL   Hgb urine dipstick NEGATIVE NEGATIVE   Bilirubin Urine NEGATIVE NEGATIVE   Ketones, ur NEGATIVE NEGATIVE mg/dL   Protein, ur NEGATIVE NEGATIVE mg/dL   Urobilinogen, UA 0.2 0.0 - 1.0 mg/dL   Nitrite NEGATIVE NEGATIVE   Leukocytes, UA NEGATIVE NEGATIVE    Comment: MICROSCOPIC NOT DONE ON URINES WITH NEGATIVE PROTEIN, BLOOD, LEUKOCYTES, NITRITE, OR GLUCOSE <1000 mg/dL.  Troponin I-serum (0, 3, 6 hours)     Status: None   Collection Time: 06/25/15  4:16 PM  Result Value Ref Range   Troponin I  <0.03 <0.031 ng/mL    Comment:        NO INDICATION OF MYOCARDIAL INJURY.   Glucose, capillary     Status: None   Collection Time: 06/25/15  4:34 PM  Result Value Ref Range   Glucose-Capillary 87 65 - 99 mg/dL   Comment 1 Notify RN    Comment 2 Document in Chart   Troponin I-serum (0, 3, 6 hours)     Status: None   Collection Time: 06/25/15  7:16 PM  Result Value Ref Range   Troponin I <0.03 <0.031 ng/mL    Comment:        NO INDICATION OF MYOCARDIAL INJURY.   Glucose, capillary     Status: Abnormal   Collection Time: 06/25/15  9:06 PM  Result Value Ref Range   Glucose-Capillary 103 (H) 65 - 99 mg/dL   Comment 1 Notify RN    Comment 2 Document in Chart   Troponin I-serum (0, 3, 6 hours)     Status: None   Collection Time: 06/25/15 10:04 PM  Result Value Ref Range   Troponin I <0.03 <0.031 ng/mL    Comment:        NO INDICATION OF MYOCARDIAL INJURY.   Glucose, capillary     Status: None   Collection Time: 06/26/15  7:48 AM  Result Value Ref Range   Glucose-Capillary 82 65 - 99 mg/dL    ABGS No results for input(s): PHART, PO2ART, TCO2, HCO3 in the last 72 hours.  Invalid input(s): PCO2 CULTURES No results found for this or any previous visit (from the past 240 hour(s)). Studies/Results: Dg Chest 2 View  06/25/2015   CLINICAL DATA:  One day history of shortness of breath  EXAM: CHEST  2 VIEW  COMPARISON:  May 14, 2015  FINDINGS: There is no edema or consolidation. Heart is mildly enlarged with pulmonary vascularity within normal limits. No adenopathy. No bone lesions.  IMPRESSION: Stable cardiac enlargement.  No edema or consolidation.   Electronically Signed   By: Lowella Grip III M.D.   On: 06/25/2015 11:23    Medications: I have reviewed the patient's current medications.  Assesment:   Active Problems:   Diabetes   CHF (  congestive heart failure)   Chest pain   CKD (chronic kidney disease), stage III   Aortic insufficiency   Essential  hypertension    Plan:  Medications reviewed Continue current treatment Continue telemetry Cardiology consult.     LOS: 1 day   Yaniv Lage 06/26/2015, 8:21 AM

## 2015-06-26 NOTE — Care Management Note (Signed)
Case Management Note  Patient Details  Name: [name] [name] MRN: [id number] Date of Birth: 1946/02/21  Expected Discharge Date:    06/27/2015              Expected Discharge Plan:  Home/Self Care  In-House Referral:  NA  Discharge planning Services  CM Consult  Post Acute Care Choice:  NA Choice offered to:  NA  DME Arranged:    DME Agency:     HH Arranged:    [place] Agency:     Status of Service:  Completed, signed off  Medicare Important Message Given:    Date Medicare IM Given:    Medicare IM give by:    Date Additional Medicare IM Given:    Additional Medicare Important Message give by:     If discussed at [place] of Stay Meetings, dates discussed:    Additional Comments: Pt is from home, lives alone and independent at baseline. Pt has home O2 from [place], neb machine and CPAP. Pt has no HH services prior to admission. Pt admitted OBS for r/o chest pain. Pt given OBS notification, form signed, copy given to patient and original placed in patient's shadow chart. Anticipate DC home 06/27/2015. No CM needs.  Sherald Barge, RN 06/26/2015, 3:43 PM

## 2015-06-27 ENCOUNTER — Ambulatory Visit: Payer: Medicare HMO | Admitting: Cardiology

## 2015-06-27 LAB — CBC
HCT: 30.3 % — ABNORMAL LOW (ref 39.0–52.0)
Hemoglobin: 9.4 g/dL — ABNORMAL LOW (ref 13.0–17.0)
MCH: 26.5 pg (ref 26.0–34.0)
MCHC: 31 g/dL (ref 30.0–36.0)
MCV: 85.4 fL (ref 78.0–100.0)
Platelets: 147 10*3/uL — ABNORMAL LOW (ref 150–400)
RBC: 3.55 MIL/uL — AB (ref 4.22–5.81)
RDW: 15.7 % — ABNORMAL HIGH (ref 11.5–15.5)
WBC: 6.1 10*3/uL (ref 4.0–10.5)

## 2015-06-27 LAB — BASIC METABOLIC PANEL
ANION GAP: 7 (ref 5–15)
BUN: 48 mg/dL — ABNORMAL HIGH (ref 6–20)
CHLORIDE: 100 mmol/L — AB (ref 101–111)
CO2: 29 mmol/L (ref 22–32)
CREATININE: 2.33 mg/dL — AB (ref 0.61–1.24)
Calcium: 8.2 mg/dL — ABNORMAL LOW (ref 8.9–10.3)
GFR calc Af Amer: 31 mL/min — ABNORMAL LOW (ref >60)
GFR calc non Af Amer: 27 mL/min — ABNORMAL LOW (ref >60)
GLUCOSE: 93 mg/dL (ref 65–99)
POTASSIUM: 4 mmol/L (ref 3.5–5.1)
Sodium: 136 mmol/L (ref 135–145)

## 2015-06-27 LAB — GLUCOSE, CAPILLARY: Glucose-Capillary: 88 mg/dL (ref 65–99)

## 2015-06-27 NOTE — Discharge Summary (Signed)
Physician Discharge Summary  Patient ID: [name] [name] MRN: [id number] DOB/AGE: 03-03-1946 69 y.o. Primary Care Physician:Shaterria Sager, MD Admit date: 06/25/2015 Discharge date: 06/27/2015    Discharge Diagnoses:   Active Problems:   Diabetes   CHF (congestive heart failure)   Chest pain   CKD (chronic kidney disease), stage III   Aortic insufficiency   Essential hypertension     Medication List    STOP taking these medications        levofloxacin 750 MG tablet  Commonly known as:  LEVAQUIN      TAKE these medications        albuterol (2.5 MG/3ML) 0.083% nebulizer solution  Commonly known as:  PROVENTIL  Take 3 mLs (2.5 mg total) by nebulization every 4 (four) hours as needed for wheezing or shortness of breath.     amLODipine 5 MG tablet  Commonly known as:  NORVASC  TAKE ONE TABLET BY MOUTH DAILY.     budesonide-formoterol 160-4.5 MCG/ACT inhaler  Commonly known as:  SYMBICORT  Inhale 2 puffs into the lungs 2 (two) times daily.     cloNIDine 0.2 MG tablet  Commonly known as:  CATAPRES  TAKE (1) TABLET BY MOUTH (3) TIMES DAILY     dexamethasone 0.5 MG tablet  Commonly known as:  DECADRON  Take 0.5 mg by mouth daily.     fluticasone 50 MCG/ACT nasal spray  Commonly known as:  FLONASE  Place 2 sprays into both nostrils daily.     Fluticasone-Salmeterol 250-50 MCG/DOSE Aepb  Commonly known as:  ADVAIR  Inhale 1 puff into the lungs 2 (two) times daily.     levothyroxine 175 MCG tablet  Commonly known as:  SYNTHROID, LEVOTHROID  Take 175 mcg by mouth daily before breakfast.     pantoprazole 40 MG tablet  Commonly known as:  PROTONIX  Take 1 tablet (40 mg total) by mouth 2 (two) times daily before a meal.     phenytoin 100 MG ER capsule  Commonly known as:  DILANTIN  Take 200-300 mg by mouth daily. Takes 200 mg on Monday and Thursdsay. On all other days of the week takes 300 mg.     polyethylene glycol powder powder  Commonly known as:   GLYCOLAX/MIRALAX  Take 17 g by mouth daily. May decrease to 17 g three times a week if needed.     simvastatin 80 MG tablet  Commonly known as:  ZOCOR  Take 80 mg by mouth daily.     tiotropium 18 MCG inhalation capsule  Commonly known as:  SPIRIVA  Place 18 mcg into inhaler and inhale daily.     torsemide 20 MG tablet  Commonly known as:  DEMADEX  Take 40 mg by mouth daily.     Vitamin D (Ergocalciferol) 50000 UNITS Caps capsule  Commonly known as:  DRISDOL  Take 50,000 Units by mouth every 7 (seven) days.        Discharged Condition: improved    Consults: cardiology  Significant Diagnostic Studies: Dg Chest 2 View  06/25/2015   CLINICAL DATA:  One day history of shortness of breath  EXAM: CHEST  2 VIEW  COMPARISON:  May 14, 2015  FINDINGS: There is no edema or consolidation. Heart is mildly enlarged with pulmonary vascularity within normal limits. No adenopathy. No bone lesions.  IMPRESSION: Stable cardiac enlargement.  No edema or consolidation.   Electronically Signed   By: Lowella Grip III M.D.   On: 06/25/2015 11:23  Lab Results: Basic Metabolic Panel:  Recent Labs  06/25/15 1320 06/27/15 0534  NA 139 136  K 3.5 4.0  CL 102 100*  CO2 28 29  GLUCOSE 87 93  BUN 41* 48*  CREATININE 2.37* 2.33*  CALCIUM 8.0* 8.2*   Liver Function Tests:  Recent Labs  06/25/15 1320  AST 12*  ALT 10*  ALKPHOS 57  BILITOT 0.4  PROT 7.3  ALBUMIN 3.4*     CBC:  Recent Labs  06/25/15 1214 06/27/15 0534  WBC 6.6 6.1  NEUTROABS 4.2  --   HGB 9.4* 9.4*  HCT 30.5* 30.3*  MCV 84.7 85.4  PLT 171 147*    Recent Results (from the past 240 hour(s))  MRSA PCR Screening     Status: None   Collection Time: 06/26/15  4:30 PM  Result Value Ref Range Status   MRSA by PCR NEGATIVE NEGATIVE Final    Comment:        The GeneXpert MRSA Assay (FDA approved for NASAL specimens only), is one component of a comprehensive MRSA colonization surveillance program. It is  not intended to diagnose MRSA infection nor to guide or monitor treatment for MRSA infections.      Hospital Course:   This is a 69 years old male with history of multiple medical illnesses was admitted due to chest pain. The pain on the left side. He was admitted under telemetry and serial EKG and Cardiac enzymes were done. His pain subsided after admission. He was evaluated by cardiology and recommended to continue his regular treatment and make out patient follow up. Patient is being discharged in stable condition.  Discharge Exam: Blood pressure 132/66, pulse 80, temperature 98.5 F (36.9 C), temperature source Oral, resp. rate 18, height 5\' 9"  (1.753 m), weight 117.346 kg (258 lb 11.2 oz), SpO2 97 %.   Disposition:  home        Follow-up Information    Follow up with [facility], MD In 2 weeks.   Specialty:  Internal Medicine   Contact information:   [place] [address] [phone]       Signed: [name]   06/27/2015, 8:14 AM

## 2015-06-27 NOTE — Progress Notes (Signed)
Discharge instruction reviewed with patient. No distress noted. Taken to lobby via wheelchair. 

## 2015-07-13 ENCOUNTER — Encounter (HOSPITAL_COMMUNITY): Payer: Self-pay | Admitting: Emergency Medicine

## 2015-07-13 ENCOUNTER — Emergency Department (HOSPITAL_COMMUNITY)
Admission: EM | Admit: 2015-07-13 | Discharge: 2015-07-13 | Disposition: A | Discharge status: Home / Self Care | Payer: Medicare HMO | Attending: Emergency Medicine | Admitting: Emergency Medicine

## 2015-07-13 ENCOUNTER — Telehealth: Payer: Self-pay

## 2015-07-13 ENCOUNTER — Emergency Department (HOSPITAL_COMMUNITY): Payer: Medicare HMO

## 2015-07-13 DIAGNOSIS — E669 Obesity, unspecified: Secondary | ICD-10-CM | POA: Diagnosis not present

## 2015-07-13 DIAGNOSIS — I129 Hypertensive chronic kidney disease with stage 1 through stage 4 chronic kidney disease, or unspecified chronic kidney disease: Secondary | ICD-10-CM | POA: Insufficient documentation

## 2015-07-13 DIAGNOSIS — Z9889 Other specified postprocedural states: Secondary | ICD-10-CM | POA: Insufficient documentation

## 2015-07-13 DIAGNOSIS — Z87891 Personal history of nicotine dependence: Secondary | ICD-10-CM | POA: Insufficient documentation

## 2015-07-13 DIAGNOSIS — Z79899 Other long term (current) drug therapy: Secondary | ICD-10-CM

## 2015-07-13 DIAGNOSIS — Z8619 Personal history of other infectious and parasitic diseases: Secondary | ICD-10-CM | POA: Insufficient documentation

## 2015-07-13 DIAGNOSIS — E039 Hypothyroidism, unspecified: Secondary | ICD-10-CM | POA: Insufficient documentation

## 2015-07-13 DIAGNOSIS — M7989 Other specified soft tissue disorders: Secondary | ICD-10-CM | POA: Insufficient documentation

## 2015-07-13 DIAGNOSIS — E119 Type 2 diabetes mellitus without complications: Secondary | ICD-10-CM | POA: Diagnosis not present

## 2015-07-13 DIAGNOSIS — Z7951 Long term (current) use of inhaled steroids: Secondary | ICD-10-CM | POA: Diagnosis not present

## 2015-07-13 DIAGNOSIS — Z872 Personal history of diseases of the skin and subcutaneous tissue: Secondary | ICD-10-CM | POA: Diagnosis not present

## 2015-07-13 DIAGNOSIS — R21 Rash and other nonspecific skin eruption: Secondary | ICD-10-CM | POA: Diagnosis not present

## 2015-07-13 DIAGNOSIS — Z8719 Personal history of other diseases of the digestive system: Secondary | ICD-10-CM | POA: Insufficient documentation

## 2015-07-13 DIAGNOSIS — Z7952 Long term (current) use of systemic steroids: Secondary | ICD-10-CM | POA: Insufficient documentation

## 2015-07-13 DIAGNOSIS — I5023 Acute on chronic systolic (congestive) heart failure: Secondary | ICD-10-CM | POA: Insufficient documentation

## 2015-07-13 DIAGNOSIS — R05 Cough: Secondary | ICD-10-CM | POA: Diagnosis not present

## 2015-07-13 DIAGNOSIS — R0602 Shortness of breath: Secondary | ICD-10-CM | POA: Diagnosis present

## 2015-07-13 DIAGNOSIS — J441 Chronic obstructive pulmonary disease with (acute) exacerbation: Secondary | ICD-10-CM | POA: Insufficient documentation

## 2015-07-13 DIAGNOSIS — N184 Chronic kidney disease, stage 4 (severe): Secondary | ICD-10-CM | POA: Diagnosis not present

## 2015-07-13 LAB — CBC WITH DIFFERENTIAL/PLATELET
Basophils Absolute: 0.1 10*3/uL (ref 0.0–0.1)
Basophils Relative: 1 % (ref 0–1)
EOS ABS: 0.5 10*3/uL (ref 0.0–0.7)
Eosinophils Relative: 8 % — ABNORMAL HIGH (ref 0–5)
HEMATOCRIT: 31.2 % — AB (ref 39.0–52.0)
Hemoglobin: 9.3 g/dL — ABNORMAL LOW (ref 13.0–17.0)
Lymphocytes Relative: 15 % (ref 12–46)
Lymphs Abs: 1 10*3/uL (ref 0.7–4.0)
MCH: 25.3 pg — ABNORMAL LOW (ref 26.0–34.0)
MCHC: 29.8 g/dL — ABNORMAL LOW (ref 30.0–36.0)
MCV: 84.8 fL (ref 78.0–100.0)
Monocytes Absolute: 0.5 10*3/uL (ref 0.1–1.0)
Monocytes Relative: 8 % (ref 3–12)
NEUTROS ABS: 4.6 10*3/uL (ref 1.7–7.7)
Neutrophils Relative %: 68 % (ref 43–77)
Platelets: 221 10*3/uL (ref 150–400)
RBC: 3.68 MIL/uL — ABNORMAL LOW (ref 4.22–5.81)
RDW: 17.4 % — AB (ref 11.5–15.5)
WBC: 6.6 10*3/uL (ref 4.0–10.5)

## 2015-07-13 LAB — BASIC METABOLIC PANEL
Anion gap: 5 (ref 5–15)
BUN: 40 mg/dL — ABNORMAL HIGH (ref 6–20)
CALCIUM: 8.4 mg/dL — AB (ref 8.9–10.3)
CO2: 23 mmol/L (ref 22–32)
Chloride: 110 mmol/L (ref 101–111)
Creatinine, Ser: 2.05 mg/dL — ABNORMAL HIGH (ref 0.61–1.24)
GFR calc Af Amer: 36 mL/min — ABNORMAL LOW (ref >60)
GFR calc non Af Amer: 31 mL/min — ABNORMAL LOW (ref >60)
Glucose, Bld: 92 mg/dL (ref 65–99)
POTASSIUM: 4.3 mmol/L (ref 3.5–5.1)
Sodium: 138 mmol/L (ref 135–145)

## 2015-07-13 LAB — I-STAT TROPONIN, ED: TROPONIN I, POC: 0.02 ng/mL (ref 0.00–0.08)

## 2015-07-13 LAB — BRAIN NATRIURETIC PEPTIDE: B Natriuretic Peptide: 1699 pg/mL — ABNORMAL HIGH (ref 0.0–100.0)

## 2015-07-13 MED ORDER — FUROSEMIDE 10 MG/ML IJ SOLN
80.0000 mg | Freq: Once | INTRAMUSCULAR | Status: AC
  Administered 2015-07-13: 80 mg via INTRAVENOUS
  Filled 2015-07-13: qty 8

## 2015-07-13 MED ORDER — TORSEMIDE 20 MG PO TABS: ORAL_TABLET | ORAL | Status: DC

## 2015-07-13 NOTE — ED Notes (Signed)
Assisted patient to sit in chair per patient request. Pt tolerated well. Pt able to ambulate without increased dyspnea.
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
## 2015-07-13 NOTE — Telephone Encounter (Signed)
Received call from Dr Bronson Ing.Pt's torsemide increased to 40 mfg in th am, and 20 mg in the pm, BMET planned for Monday 07/16/15, follow scheduled for 07/19/15 at 1:30 pm with Melina Copa PA-C

## 2015-07-13 NOTE — ED Notes (Signed)
MD Jacubowitz at bedside updating patient.

## 2015-07-13 NOTE — ED Notes (Signed)
Pt states that he has been having increased swelling over the past few days with sob starting yesterday.  Denies pain.  Is on 2L [place] 24/7.

## 2015-07-13 NOTE — ED Notes (Signed)
MD Jacubowitz at bedside.  

## 2015-07-13 NOTE — ED Notes (Signed)
Pt given Sprite to drink with MD Jacubowitz approval.

## 2015-07-13 NOTE — Discharge Instructions (Signed)
Increase your Demadex (toresemide) dose to 2 tablets 40 milligrams in the morning and 1 tablet 20 milligrams at night. Keep your scheduled appointment with your heart cardiologist for next week. Return if you feel worse for any reason.

## 2015-07-13 NOTE — ED Notes (Signed)
Dr. Koneswaren(Cardiology) paged for Dr. Winfred Leeds.

## 2015-07-16 ENCOUNTER — Ambulatory Visit: Payer: Medicare HMO | Admitting: Cardiovascular Disease

## 2015-07-17 LAB — BASIC METABOLIC PANEL
BUN: 48 mg/dL — ABNORMAL HIGH (ref 7–25)
CO2: 27 mmol/L (ref 20–31)
Calcium: 8.6 mg/dL (ref 8.6–10.3)
Chloride: 103 mmol/L (ref 98–110)
Creat: 2.73 mg/dL — ABNORMAL HIGH (ref 0.70–1.18)
Glucose, Bld: 90 mg/dL (ref 65–99)
Potassium: 4.7 mmol/L (ref 3.5–5.3)
SODIUM: 137 mmol/L (ref 135–146)
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
## 2015-07-19 ENCOUNTER — Ambulatory Visit (INDEPENDENT_AMBULATORY_CARE_PROVIDER_SITE_OTHER): Payer: Medicare HMO | Admitting: Physician Assistant

## 2015-07-19 ENCOUNTER — Encounter: Payer: Self-pay | Admitting: Physician Assistant

## 2015-07-19 VITALS — BP 128/46 | HR 78 | Ht 73.0 in | Wt 254.0 lb

## 2015-07-19 DIAGNOSIS — D649 Anemia, unspecified: Secondary | ICD-10-CM

## 2015-07-19 DIAGNOSIS — I5022 Chronic systolic (congestive) heart failure: Secondary | ICD-10-CM | POA: Diagnosis not present

## 2015-07-19 DIAGNOSIS — I351 Nonrheumatic aortic (valve) insufficiency: Secondary | ICD-10-CM

## 2015-07-19 DIAGNOSIS — N184 Chronic kidney disease, stage 4 (severe): Secondary | ICD-10-CM

## 2015-07-19 DIAGNOSIS — J449 Chronic obstructive pulmonary disease, unspecified: Secondary | ICD-10-CM

## 2015-07-19 NOTE — Patient Instructions (Addendum)
Your physician recommends that you schedule a follow-up appointment in: 3 weeks with Dr Bronson Ing  Get lab work today  BMET  After your 5 days of taking Torsemide 40 mg twice a day, go back down to 40 mg am and 20 mg pm     Thank you for choosing [facility] !

## 2015-07-20 LAB — BASIC METABOLIC PANEL
BUN: 50 mg/dL — ABNORMAL HIGH (ref 7–25)
CO2: 26 mmol/L (ref 20–31)
Calcium: 8.5 mg/dL — ABNORMAL LOW (ref 8.6–10.3)
Chloride: 103 mmol/L (ref 98–110)
Creat: 2.98 mg/dL — ABNORMAL HIGH (ref 0.70–1.18)
GLUCOSE: 76 mg/dL (ref 65–99)
POTASSIUM: 4.5 mmol/L (ref 3.5–5.3)
Sodium: 141 mmol/L (ref 135–146)

## 2015-08-14 ENCOUNTER — Ambulatory Visit: Payer: Medicare HMO | Admitting: Cardiovascular Disease

## 2015-08-17 ENCOUNTER — Encounter (HOSPITAL_COMMUNITY)
Admission: RE | Admit: 2015-08-17 | Discharge: 2015-08-17 | Disposition: A | Discharge status: Home / Self Care | Payer: Medicare HMO | Source: Ambulatory Visit | Attending: Nephrology | Admitting: Nephrology

## 2015-08-17 DIAGNOSIS — D509 Iron deficiency anemia, unspecified: Secondary | ICD-10-CM | POA: Diagnosis present

## 2015-08-17 MED ORDER — SODIUM CHLORIDE 0.9 % IV SOLN
INTRAVENOUS | Status: DC
  Administered 2015-08-17: 10:00:00 via INTRAVENOUS

## 2015-08-17 MED ORDER — FERUMOXYTOL INJECTION 510 MG/17 ML
510.0000 mg | INTRAVENOUS | Status: DC
  Administered 2015-08-17: 510 mg via INTRAVENOUS
  Filled 2015-08-17: qty 17

## 2015-08-24 ENCOUNTER — Encounter (HOSPITAL_COMMUNITY)
Admission: RE | Admit: 2015-08-24 | Discharge: 2015-08-24 | Disposition: A | Discharge status: Home / Self Care | Payer: Medicare HMO | Source: Ambulatory Visit | Attending: Nephrology | Admitting: Nephrology

## 2015-08-24 DIAGNOSIS — D509 Iron deficiency anemia, unspecified: Secondary | ICD-10-CM | POA: Diagnosis not present

## 2015-08-24 MED ORDER — SODIUM CHLORIDE 0.9 % IV SOLN
510.0000 mg | INTRAVENOUS | Status: DC
  Administered 2015-08-24: 510 mg via INTRAVENOUS
  Filled 2015-08-24: qty 17

## 2015-08-24 MED ORDER — SODIUM CHLORIDE 0.9 % IV SOLN
INTRAVENOUS | Status: DC
  Administered 2015-08-24: 11:00:00 via INTRAVENOUS

## 2015-09-04 ENCOUNTER — Ambulatory Visit (HOSPITAL_COMMUNITY)
Admission: RE | Admit: 2015-09-04 | Discharge: 2015-09-04 | Disposition: A | Discharge status: Home / Self Care | Payer: Medicare HMO | Source: Ambulatory Visit | Attending: Internal Medicine | Admitting: Internal Medicine

## 2015-09-04 ENCOUNTER — Other Ambulatory Visit (HOSPITAL_COMMUNITY): Payer: Self-pay | Admitting: Internal Medicine

## 2015-09-04 DIAGNOSIS — R0602 Shortness of breath: Secondary | ICD-10-CM | POA: Diagnosis present

## 2015-09-04 DIAGNOSIS — R918 Other nonspecific abnormal finding of lung field: Secondary | ICD-10-CM | POA: Insufficient documentation

## 2015-09-04 DIAGNOSIS — J449 Chronic obstructive pulmonary disease, unspecified: Secondary | ICD-10-CM | POA: Diagnosis not present

## 2015-09-05 ENCOUNTER — Ambulatory Visit (INDEPENDENT_AMBULATORY_CARE_PROVIDER_SITE_OTHER): Payer: Medicare HMO | Admitting: Cardiovascular Disease

## 2015-09-05 ENCOUNTER — Encounter: Payer: Self-pay | Admitting: Cardiovascular Disease

## 2015-09-05 VITALS — BP 122/56 | HR 90 | Ht 73.0 in | Wt 259.0 lb

## 2015-09-05 DIAGNOSIS — N184 Chronic kidney disease, stage 4 (severe): Secondary | ICD-10-CM

## 2015-09-05 DIAGNOSIS — I5022 Chronic systolic (congestive) heart failure: Secondary | ICD-10-CM | POA: Diagnosis not present

## 2015-09-05 DIAGNOSIS — I351 Nonrheumatic aortic (valve) insufficiency: Secondary | ICD-10-CM | POA: Diagnosis not present

## 2015-09-05 DIAGNOSIS — I10 Essential (primary) hypertension: Secondary | ICD-10-CM

## 2015-09-05 DIAGNOSIS — D649 Anemia, unspecified: Secondary | ICD-10-CM

## 2015-09-05 DIAGNOSIS — Z6841 Body Mass Index (BMI) 40.0 and over, adult: Secondary | ICD-10-CM

## 2015-09-05 DIAGNOSIS — J449 Chronic obstructive pulmonary disease, unspecified: Secondary | ICD-10-CM
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
## 2015-09-05 NOTE — Patient Instructions (Signed)
Your physician recommends that you schedule a follow-up appointment in: [place], PA-C  Your physician recommends that you continue on your current medications as directed. Please refer to the Current Medication list given to you today.  Thanks for choosing [facility]!!!

## 2015-09-14 ENCOUNTER — Encounter: Payer: Self-pay | Admitting: "Endocrinology

## 2015-09-14 ENCOUNTER — Ambulatory Visit (INDEPENDENT_AMBULATORY_CARE_PROVIDER_SITE_OTHER): Payer: Medicare HMO | Admitting: "Endocrinology

## 2015-09-14 VITALS — BP 163/71 | HR 93 | Ht 71.0 in | Wt 269.0 lb

## 2015-09-14 DIAGNOSIS — E785 Hyperlipidemia, unspecified: Secondary | ICD-10-CM

## 2015-09-14 DIAGNOSIS — E039 Hypothyroidism, unspecified: Secondary | ICD-10-CM

## 2015-09-14 DIAGNOSIS — E230 Hypopituitarism: Secondary | ICD-10-CM

## 2015-09-14 DIAGNOSIS — E038 Other specified hypothyroidism: Secondary | ICD-10-CM

## 2015-09-14 DIAGNOSIS — E559 Vitamin D deficiency, unspecified: Secondary | ICD-10-CM

## 2015-09-14 DIAGNOSIS — E232 Diabetes insipidus: Secondary | ICD-10-CM

## 2015-09-14 MED ORDER — DESMOPRESSIN ACETATE 0.2 MG PO TABS: 0.2000 mg | ORAL_TABLET | Freq: Two times a day (BID) | ORAL | Status: DC

## 2015-09-14 MED ORDER — DEXAMETHASONE 0.5 MG PO TABS: 0.5000 mg | ORAL_TABLET | Freq: Every day | ORAL | Status: DC

## 2015-09-14 MED ORDER — VITAMIN D (ERGOCALCIFEROL) 1.25 MG (50000 UNIT) PO CAPS: 50000.0000 [IU] | ORAL_CAPSULE | ORAL | Status: DC

## 2015-09-14 MED ORDER — LEVOTHYROXINE SODIUM 175 MCG PO TABS: 175.0000 ug | ORAL_TABLET | Freq: Every day | ORAL | Status: DC
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
## 2015-09-14 NOTE — Patient Instructions (Signed)

## 2015-10-26 ENCOUNTER — Inpatient Hospital Stay (HOSPITAL_COMMUNITY)
Admission: EM | Admit: 2015-10-26 | Discharge: 2015-11-01 | DRG: 291 | Disposition: A | Discharge status: Home / Self Care | Payer: Medicare HMO | Attending: Internal Medicine | Admitting: Internal Medicine

## 2015-10-26 ENCOUNTER — Encounter (HOSPITAL_COMMUNITY): Payer: Self-pay

## 2015-10-26 ENCOUNTER — Emergency Department (HOSPITAL_COMMUNITY): Payer: Medicare HMO

## 2015-10-26 DIAGNOSIS — I5043 Acute on chronic combined systolic (congestive) and diastolic (congestive) heart failure: Secondary | ICD-10-CM | POA: Diagnosis not present

## 2015-10-26 DIAGNOSIS — N179 Acute kidney failure, unspecified: Secondary | ICD-10-CM | POA: Diagnosis present

## 2015-10-26 DIAGNOSIS — Z9119 Patient's noncompliance with other medical treatment and regimen: Secondary | ICD-10-CM

## 2015-10-26 DIAGNOSIS — E662 Morbid (severe) obesity with alveolar hypoventilation: Secondary | ICD-10-CM | POA: Diagnosis present

## 2015-10-26 DIAGNOSIS — Z87891 Personal history of nicotine dependence: Secondary | ICD-10-CM

## 2015-10-26 DIAGNOSIS — I351 Nonrheumatic aortic (valve) insufficiency: Secondary | ICD-10-CM | POA: Diagnosis present

## 2015-10-26 DIAGNOSIS — E1122 Type 2 diabetes mellitus with diabetic chronic kidney disease: Secondary | ICD-10-CM | POA: Diagnosis present

## 2015-10-26 DIAGNOSIS — E039 Hypothyroidism, unspecified: Secondary | ICD-10-CM | POA: Diagnosis present

## 2015-10-26 DIAGNOSIS — I509 Heart failure, unspecified: Secondary | ICD-10-CM

## 2015-10-26 DIAGNOSIS — E785 Hyperlipidemia, unspecified: Secondary | ICD-10-CM | POA: Diagnosis present

## 2015-10-26 DIAGNOSIS — R0600 Dyspnea, unspecified: Secondary | ICD-10-CM | POA: Diagnosis present

## 2015-10-26 DIAGNOSIS — E230 Hypopituitarism: Secondary | ICD-10-CM | POA: Diagnosis present

## 2015-10-26 DIAGNOSIS — I130 Hypertensive heart and chronic kidney disease with heart failure and stage 1 through stage 4 chronic kidney disease, or unspecified chronic kidney disease: Secondary | ICD-10-CM | POA: Diagnosis present

## 2015-10-26 DIAGNOSIS — J9610 Chronic respiratory failure, unspecified whether with hypoxia or hypercapnia: Secondary | ICD-10-CM | POA: Diagnosis present

## 2015-10-26 DIAGNOSIS — I5042 Chronic combined systolic (congestive) and diastolic (congestive) heart failure: Secondary | ICD-10-CM | POA: Diagnosis present

## 2015-10-26 DIAGNOSIS — Z9111 Patient's noncompliance with dietary regimen: Secondary | ICD-10-CM

## 2015-10-26 DIAGNOSIS — I10 Essential (primary) hypertension: Secondary | ICD-10-CM | POA: Diagnosis present

## 2015-10-26 DIAGNOSIS — Z905 Acquired absence of kidney: Secondary | ICD-10-CM | POA: Diagnosis not present

## 2015-10-26 DIAGNOSIS — I5020 Unspecified systolic (congestive) heart failure: Secondary | ICD-10-CM

## 2015-10-26 DIAGNOSIS — G40909 Epilepsy, unspecified, not intractable, without status epilepticus: Secondary | ICD-10-CM | POA: Diagnosis present

## 2015-10-26 DIAGNOSIS — J441 Chronic obstructive pulmonary disease with (acute) exacerbation: Secondary | ICD-10-CM | POA: Diagnosis present

## 2015-10-26 DIAGNOSIS — N184 Chronic kidney disease, stage 4 (severe): Secondary | ICD-10-CM | POA: Diagnosis present

## 2015-10-26 DIAGNOSIS — Z8249 Family history of ischemic heart disease and other diseases of the circulatory system: Secondary | ICD-10-CM | POA: Diagnosis not present

## 2015-10-26 DIAGNOSIS — E119 Type 2 diabetes mellitus without complications: Secondary | ICD-10-CM

## 2015-10-26 LAB — COMPREHENSIVE METABOLIC PANEL
ALK PHOS: 43 U/L (ref 38–126)
ALT: 24 U/L (ref 17–63)
AST: 22 U/L (ref 15–41)
Albumin: 3.5 g/dL (ref 3.5–5.0)
Anion gap: 9 (ref 5–15)
BUN: 48 mg/dL — AB (ref 6–20)
CHLORIDE: 104 mmol/L (ref 101–111)
CO2: 24 mmol/L (ref 22–32)
CREATININE: 2.54 mg/dL — AB (ref 0.61–1.24)
Calcium: 8.5 mg/dL — ABNORMAL LOW (ref 8.9–10.3)
GFR calc non Af Amer: 24 mL/min — ABNORMAL LOW (ref >60)
GFR, EST AFRICAN AMERICAN: 28 mL/min — AB (ref >60)
GLUCOSE: 103 mg/dL — AB (ref 65–99)
Potassium: 4.9 mmol/L (ref 3.5–5.1)
SODIUM: 137 mmol/L (ref 135–145)
Total Bilirubin: 0.7 mg/dL (ref 0.3–1.2)
Total Protein: 7.4 g/dL (ref 6.5–8.1)

## 2015-10-26 LAB — TROPONIN I

## 2015-10-26 LAB — CBC WITH DIFFERENTIAL/PLATELET
Basophils Absolute: 0.1 10*3/uL (ref 0.0–0.1)
Basophils Relative: 1 %
EOS ABS: 1.7 10*3/uL — AB (ref 0.0–0.7)
Eosinophils Relative: 20 %
HEMATOCRIT: 36 % — AB (ref 39.0–52.0)
HEMOGLOBIN: 11.4 g/dL — AB (ref 13.0–17.0)
LYMPHS ABS: 0.8 10*3/uL (ref 0.7–4.0)
Lymphocytes Relative: 10 %
MCH: 28.7 pg (ref 26.0–34.0)
MCHC: 31.7 g/dL (ref 30.0–36.0)
MCV: 90.7 fL (ref 78.0–100.0)
MONOS PCT: 8 %
Monocytes Absolute: 0.7 10*3/uL (ref 0.1–1.0)
NEUTROS ABS: 5.1 10*3/uL (ref 1.7–7.7)
NEUTROS PCT: 61 %
Platelets: 157 10*3/uL (ref 150–400)
RBC: 3.97 MIL/uL — AB (ref 4.22–5.81)
RDW: 19.7 % — ABNORMAL HIGH (ref 11.5–15.5)
WBC: 8.4 10*3/uL (ref 4.0–10.5)

## 2015-10-26 LAB — BRAIN NATRIURETIC PEPTIDE: B NATRIURETIC PEPTIDE 5: 781 pg/mL — AB (ref 0.0–100.0)

## 2015-10-26 MED ORDER — METOLAZONE 5 MG PO TABS
5.0000 mg | ORAL_TABLET | Freq: Every day | ORAL | Status: DC
  Administered 2015-10-27 – 2015-11-01 (×6): 5 mg via ORAL
  Filled 2015-10-26 (×6): qty 1

## 2015-10-26 MED ORDER — POLYETHYLENE GLYCOL 3350 17 G PO PACK
17.0000 g | PACK | Freq: Every day | ORAL | Status: DC
  Administered 2015-10-27 – 2015-11-01 (×6): 17 g via ORAL
  Filled 2015-10-26 (×6): qty 1

## 2015-10-26 MED ORDER — CLONIDINE HCL 0.2 MG PO TABS
0.2000 mg | ORAL_TABLET | Freq: Three times a day (TID) | ORAL | Status: DC
  Administered 2015-10-26 – 2015-11-01 (×17): 0.2 mg via ORAL
  Filled 2015-10-26 (×17): qty 1

## 2015-10-26 MED ORDER — BUDESONIDE-FORMOTEROL FUMARATE 160-4.5 MCG/ACT IN AERO
INHALATION_SPRAY | RESPIRATORY_TRACT | Status: AC
  Filled 2015-10-26: qty 6

## 2015-10-26 MED ORDER — FUROSEMIDE 10 MG/ML IJ SOLN
80.0000 mg | Freq: Two times a day (BID) | INTRAMUSCULAR | Status: DC
  Administered 2015-10-27: 80 mg via INTRAVENOUS
  Filled 2015-10-26: qty 8

## 2015-10-26 MED ORDER — DEXAMETHASONE 0.5 MG PO TABS
0.5000 mg | ORAL_TABLET | Freq: Every day | ORAL | Status: DC
  Administered 2015-10-27 – 2015-11-01 (×6): 0.5 mg via ORAL
  Filled 2015-10-26 (×7): qty 1

## 2015-10-26 MED ORDER — FUROSEMIDE 10 MG/ML IJ SOLN
80.0000 mg | Freq: Two times a day (BID) | INTRAMUSCULAR | Status: DC
Start: 2015-10-26 — End: 2015-10-26
  Filled 2015-10-26: qty 8

## 2015-10-26 MED ORDER — FLUTICASONE PROPIONATE 50 MCG/ACT NA SUSP
NASAL | Status: AC
  Filled 2015-10-26: qty 16

## 2015-10-26 MED ORDER — ONDANSETRON HCL 4 MG/2ML IJ SOLN: 4.0000 mg | Freq: Four times a day (QID) | INTRAMUSCULAR | Status: DC | PRN

## 2015-10-26 MED ORDER — POTASSIUM CHLORIDE 20 MEQ PO PACK
20.0000 meq | PACK | Freq: Two times a day (BID) | ORAL | Status: DC
  Administered 2015-10-26: 20 meq via ORAL
  Filled 2015-10-26: qty 1

## 2015-10-26 MED ORDER — PHENYTOIN SODIUM EXTENDED 100 MG PO CAPS
200.0000 mg | ORAL_CAPSULE | ORAL | Status: DC
  Administered 2015-10-29 – 2015-11-01 (×2): 200 mg via ORAL
  Filled 2015-10-26 (×3): qty 2

## 2015-10-26 MED ORDER — PHENYTOIN SODIUM EXTENDED 100 MG PO CAPS
300.0000 mg | ORAL_CAPSULE | ORAL | Status: DC
  Administered 2015-10-27 – 2015-10-31 (×4): 300 mg via ORAL
  Filled 2015-10-26 (×3): qty 3

## 2015-10-26 MED ORDER — AMLODIPINE BESYLATE 5 MG PO TABS
5.0000 mg | ORAL_TABLET | Freq: Every day | ORAL | Status: DC
  Administered 2015-10-27 – 2015-11-01 (×6): 5 mg via ORAL
  Filled 2015-10-26 (×6): qty 1

## 2015-10-26 MED ORDER — MOMETASONE FURO-FORMOTEROL FUM 100-5 MCG/ACT IN AERO
2.0000 | INHALATION_SPRAY | Freq: Two times a day (BID) | RESPIRATORY_TRACT | Status: DC
  Administered 2015-10-26 – 2015-11-01 (×12): 2 via RESPIRATORY_TRACT
  Filled 2015-10-26: qty 8.8

## 2015-10-26 MED ORDER — CALCITRIOL 0.25 MCG PO CAPS
0.2500 ug | ORAL_CAPSULE | Freq: Every day | ORAL | Status: DC
  Administered 2015-10-27 – 2015-11-01 (×6): 0.25 ug via ORAL
  Filled 2015-10-26 (×6): qty 1

## 2015-10-26 MED ORDER — SODIUM CHLORIDE 0.9 % IJ SOLN
3.0000 mL | Freq: Two times a day (BID) | INTRAMUSCULAR | Status: DC
  Administered 2015-10-26 – 2015-11-01 (×11): 3 mL via INTRAVENOUS

## 2015-10-26 MED ORDER — MOMETASONE FURO-FORMOTEROL FUM 100-5 MCG/ACT IN AERO
INHALATION_SPRAY | RESPIRATORY_TRACT | Status: AC
  Filled 2015-10-26: qty 8.8

## 2015-10-26 MED ORDER — BUDESONIDE-FORMOTEROL FUMARATE 160-4.5 MCG/ACT IN AERO
2.0000 | INHALATION_SPRAY | Freq: Two times a day (BID) | RESPIRATORY_TRACT | Status: DC
  Administered 2015-10-26 – 2015-11-01 (×12): 2 via RESPIRATORY_TRACT
  Filled 2015-10-26: qty 6

## 2015-10-26 MED ORDER — VITAMIN D (ERGOCALCIFEROL) 1.25 MG (50000 UNIT) PO CAPS
50000.0000 [IU] | ORAL_CAPSULE | ORAL | Status: DC
  Administered 2015-10-27: 50000 [IU] via ORAL
  Filled 2015-10-26: qty 1

## 2015-10-26 MED ORDER — SIMVASTATIN 20 MG PO TABS
20.0000 mg | ORAL_TABLET | Freq: Every day | ORAL | Status: DC
Start: 2015-10-26 — End: 2015-10-26
  Filled 2015-10-26: qty 1

## 2015-10-26 MED ORDER — FLUTICASONE PROPIONATE 50 MCG/ACT NA SUSP
2.0000 | Freq: Every day | NASAL | Status: DC
  Administered 2015-10-27 – 2015-11-01 (×6): 2 via NASAL
  Filled 2015-10-26: qty 16

## 2015-10-26 MED ORDER — POLYETHYLENE GLYCOL 3350 17 GM/SCOOP PO POWD: 17.0000 g | Freq: Every day | ORAL | Status: DC

## 2015-10-26 MED ORDER — TIOTROPIUM BROMIDE MONOHYDRATE 18 MCG IN CAPS
18.0000 ug | ORAL_CAPSULE | Freq: Every day | RESPIRATORY_TRACT | Status: DC
  Administered 2015-10-27 – 2015-10-31 (×5): 18 ug via RESPIRATORY_TRACT
  Filled 2015-10-26: qty 5

## 2015-10-26 MED ORDER — ALBUTEROL SULFATE (2.5 MG/3ML) 0.083% IN NEBU
2.5000 mg | INHALATION_SOLUTION | RESPIRATORY_TRACT | Status: DC | PRN
  Administered 2015-10-27 – 2015-10-29 (×5): 2.5 mg via RESPIRATORY_TRACT
  Filled 2015-10-26 (×5): qty 3

## 2015-10-26 MED ORDER — FUROSEMIDE 10 MG/ML IJ SOLN
40.0000 mg | Freq: Once | INTRAMUSCULAR | Status: AC
  Administered 2015-10-26: 40 mg via INTRAVENOUS
  Filled 2015-10-26: qty 4

## 2015-10-26 MED ORDER — PANTOPRAZOLE SODIUM 40 MG PO TBEC
40.0000 mg | DELAYED_RELEASE_TABLET | Freq: Two times a day (BID) | ORAL | Status: DC
  Administered 2015-10-26 – 2015-11-01 (×12): 40 mg via ORAL
  Filled 2015-10-26 (×12): qty 1

## 2015-10-26 MED ORDER — AMLODIPINE BESYLATE 5 MG PO TABS
5.0000 mg | ORAL_TABLET | Freq: Every day | ORAL | Status: DC
  Filled 2015-10-26: qty 1

## 2015-10-26 MED ORDER — DESMOPRESSIN ACETATE 0.2 MG PO TABS
ORAL_TABLET | ORAL | Status: AC
  Filled 2015-10-26: qty 1

## 2015-10-26 MED ORDER — LEVOTHYROXINE SODIUM 75 MCG PO TABS
175.0000 ug | ORAL_TABLET | Freq: Every day | ORAL | Status: DC
  Administered 2015-10-27 – 2015-11-01 (×6): 175 ug via ORAL
  Filled 2015-10-26 (×6): qty 1

## 2015-10-26 MED ORDER — SIMVASTATIN 20 MG PO TABS
20.0000 mg | ORAL_TABLET | Freq: Every day | ORAL | Status: DC
  Administered 2015-10-27 – 2015-10-31 (×5): 20 mg via ORAL
  Filled 2015-10-26 (×5): qty 1

## 2015-10-26 MED ORDER — ENOXAPARIN SODIUM 30 MG/0.3ML ~~LOC~~ SOLN
30.0000 mg | SUBCUTANEOUS | Status: DC
  Administered 2015-10-26 – 2015-10-27 (×2): 30 mg via SUBCUTANEOUS
  Filled 2015-10-26: qty 0.3

## 2015-10-26 MED ORDER — DESMOPRESSIN ACETATE 0.2 MG PO TABS
0.2000 mg | ORAL_TABLET | Freq: Two times a day (BID) | ORAL | Status: DC
  Administered 2015-10-26 – 2015-11-01 (×12): 0.2 mg via ORAL
  Filled 2015-10-26 (×14): qty 1

## 2015-10-26 MED ORDER — ENOXAPARIN SODIUM 30 MG/0.3ML ~~LOC~~ SOLN
30.0000 mg | SUBCUTANEOUS | Status: DC
  Filled 2015-10-26: qty 0.3

## 2015-10-26 MED ORDER — PHENYTOIN SODIUM EXTENDED 100 MG PO CAPS
200.0000 mg | ORAL_CAPSULE | Freq: Every day | ORAL | Status: DC
  Administered 2015-10-26: 200 mg via ORAL
  Filled 2015-10-26: qty 2

## 2015-10-26 MED ORDER — ACETAMINOPHEN 325 MG PO TABS: 650.0000 mg | ORAL_TABLET | ORAL | Status: DC | PRN

## 2015-10-26 NOTE — ED Notes (Addendum)
Pt reports sob, upper abd pain, diarrhea  and cough since yesterday.   Also reports increased swelling in feet and legs.   Denies fever, denies n/v.

## 2015-10-26 NOTE — ED Notes (Signed)
Edema bilaterally to lower extremities, discoloration.
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
## 2015-10-27 ENCOUNTER — Inpatient Hospital Stay (HOSPITAL_COMMUNITY): Payer: Medicare HMO

## 2015-10-27 LAB — BASIC METABOLIC PANEL
Anion gap: 9 (ref 5–15)
BUN: 52 mg/dL — AB (ref 6–20)
CALCIUM: 8.8 mg/dL — AB (ref 8.9–10.3)
CHLORIDE: 105 mmol/L (ref 101–111)
CO2: 21 mmol/L — AB (ref 22–32)
CREATININE: 2.51 mg/dL — AB (ref 0.61–1.24)
GFR calc non Af Amer: 24 mL/min — ABNORMAL LOW (ref >60)
GFR, EST AFRICAN AMERICAN: 28 mL/min — AB (ref >60)
Glucose, Bld: 95 mg/dL (ref 65–99)
Potassium: 5.5 mmol/L — ABNORMAL HIGH (ref 3.5–5.1)
SODIUM: 135 mmol/L (ref 135–145)

## 2015-10-27 LAB — MRSA PCR SCREENING: MRSA BY PCR: NEGATIVE

## 2015-10-27 MED ORDER — HYDROCODONE-ACETAMINOPHEN 5-325 MG PO TABS
1.0000 | ORAL_TABLET | Freq: Four times a day (QID) | ORAL | Status: DC | PRN
  Administered 2015-10-27 (×2): 1 via ORAL
  Filled 2015-10-27 (×2): qty 1

## 2015-10-27 MED ORDER — FUROSEMIDE 10 MG/ML IJ SOLN
80.0000 mg | Freq: Three times a day (TID) | INTRAMUSCULAR | Status: DC
  Administered 2015-10-27 – 2015-10-28 (×3): 80 mg via INTRAVENOUS
  Filled 2015-10-27 (×3): qty 8

## 2015-10-27 NOTE — Progress Notes (Signed)
*  PRELIMINARY RESULTS* Echocardiogram 2D Echocardiogram has been performed.  [name] [name] 10/27/2015, 3:47 PM

## 2015-10-27 NOTE — Progress Notes (Signed)
Subjective: He was admitted yesterday with heart failure. He says he has noticed problems over the last 2 or 3 days. He does not wear his CPAP at home and I told him again today that sleep apnea can make his heart failure worse. He refuses to wear C Pap here also. He says he still short of breath and his legs are swollen. He thinks he has some abdominal swelling as well.  Objective: Vital signs in last 24 hours: Temp:  [97.6 F (36.4 C)-98.9 F (37.2 C)] 98.9 F (37.2 C) (11/19 0425) Pulse Rate:  [86-94] 86 (11/19 0425) Resp:  [13-25] 20 (11/19 0425) BP: (134-174)/(49-73) 144/49 mmHg (11/19 0425) SpO2:  [94 %-100 %] 95 % (11/19 0855) Weight:  [119 kg (262 lb 5.6 oz)-120.884 kg (266 lb 8 oz)] 119 kg (262 lb 5.6 oz) (11/19 0425) Weight change:  Last BM Date: 10/26/15  Intake/Output from previous day: 11/18 0701 - 11/19 0700 In: -  Out: 675 [Urine:675]  PHYSICAL EXAM General appearance: alert, mild distress and morbidly obese Resp: rales bibasilar Cardio: regular rate and rhythm, S1, S2 normal, no murmur, click, rub or gallop GI: His abdomen is distended Extremities: 2+ edema bilaterally plus he has chronic venous stasis changes  Lab Results:  Results for orders placed or performed during the hospital encounter of 10/26/15 (from the past 48 hour(s))  CBC with Differential     Status: Abnormal   Collection Time: 10/26/15  4:12 PM  Result Value Ref Range   WBC 8.4 4.0 - 10.5 K/uL   RBC 3.97 (L) 4.22 - 5.81 MIL/uL   Hemoglobin 11.4 (L) 13.0 - 17.0 g/dL   HCT 36.0 (L) 39.0 - 52.0 %   MCV 90.7 78.0 - 100.0 fL   MCH 28.7 26.0 - 34.0 pg   MCHC 31.7 30.0 - 36.0 g/dL   RDW 19.7 (H) 11.5 - 15.5 %   Platelets 157 150 - 400 K/uL   Neutrophils Relative % 61 %   Neutro Abs 5.1 1.7 - 7.7 K/uL   Lymphocytes Relative 10 %   Lymphs Abs 0.8 0.7 - 4.0 K/uL   Monocytes Relative 8 %   Monocytes Absolute 0.7 0.1 - 1.0 K/uL   Eosinophils Relative 20 %   Eosinophils Absolute 1.7 (H) 0.0 - 0.7  K/uL   Basophils Relative 1 %   Basophils Absolute 0.1 0.0 - 0.1 K/uL  Comprehensive metabolic panel     Status: Abnormal   Collection Time: 10/26/15  4:12 PM  Result Value Ref Range   Sodium 137 135 - 145 mmol/L   Potassium 4.9 3.5 - 5.1 mmol/L   Chloride 104 101 - 111 mmol/L   CO2 24 22 - 32 mmol/L   Glucose, Bld 103 (H) 65 - 99 mg/dL   BUN 48 (H) 6 - 20 mg/dL   Creatinine, Ser 2.54 (H) 0.61 - 1.24 mg/dL   Calcium 8.5 (L) 8.9 - 10.3 mg/dL   Total Protein 7.4 6.5 - 8.1 g/dL   Albumin 3.5 3.5 - 5.0 g/dL   AST 22 15 - 41 U/L   ALT 24 17 - 63 U/L   Alkaline Phosphatase 43 38 - 126 U/L   Total Bilirubin 0.7 0.3 - 1.2 mg/dL   GFR calc non Af Amer 24 (L) >60 mL/min   GFR calc Af Amer 28 (L) >60 mL/min    Comment: (NOTE) The eGFR has been calculated using the CKD EPI equation. This calculation has not been validated in all clinical situations.  eGFR's persistently <60 mL/min signify possible Chronic Kidney Disease.    Anion gap 9 5 - 15  Brain natriuretic peptide     Status: Abnormal   Collection Time: 10/26/15  4:12 PM  Result Value Ref Range   B Natriuretic Peptide 781.0 (H) 0.0 - 100.0 pg/mL  Troponin I     Status: None   Collection Time: 10/26/15  4:12 PM  Result Value Ref Range   Troponin I <0.03 <0.031 ng/mL    Comment:        NO INDICATION OF MYOCARDIAL INJURY.   Basic metabolic panel     Status: Abnormal   Collection Time: 10/27/15  5:01 AM  Result Value Ref Range   Sodium 135 135 - 145 mmol/L   Potassium 5.5 (H) 3.5 - 5.1 mmol/L   Chloride 105 101 - 111 mmol/L   CO2 21 (L) 22 - 32 mmol/L   Glucose, Bld 95 65 - 99 mg/dL   BUN 52 (H) 6 - 20 mg/dL   Creatinine, Ser 2.51 (H) 0.61 - 1.24 mg/dL   Calcium 8.8 (L) 8.9 - 10.3 mg/dL   GFR calc non Af Amer 24 (L) >60 mL/min   GFR calc Af Amer 28 (L) >60 mL/min    Comment: (NOTE) The eGFR has been calculated using the CKD EPI equation. This calculation has not been validated in all clinical situations. eGFR's  persistently <60 mL/min signify possible Chronic Kidney Disease.    Anion gap 9 5 - 15    ABGS No results for input(s): PHART, PO2ART, TCO2, HCO3 in the last 72 hours.  Invalid input(s): PCO2 CULTURES No results found for this or any previous visit (from the past 240 hour(s)). Studies/Results: Dg Chest Portable 1 View  10/26/2015  CLINICAL DATA:  Patient with bilateral lower extremity swelling and chest pain. Shortness of breath for 1 week. EXAM: PORTABLE CHEST 1 VIEWm COMPARISON:  Chest radiograph 09/04/2015 FINDINGS: Monitoring leads overlie the patient. Marked cardiomegaly. Interval development of bilateral mid and lower lung airspace opacities, right greater left. Possible small bilateral pleural effusions. IMPRESSION: Marked cardiomegaly. Bilateral mid and lower lung airspace opacities, right greater than left. This may represent pulmonary edema. Infection and/or aspiration are additional considerations. Possible small bilateral pleural effusions. Electronically Signed   By: Lovey Newcomer M.D.   On: 10/26/2015 16:15    Medications:  Prior to Admission:  Prescriptions prior to admission  Medication Sig Dispense Refill Last Dose  . albuterol (PROVENTIL) (2.5 MG/3ML) 0.083% nebulizer solution Take 3 mLs (2.5 mg total) by nebulization every 4 (four) hours as needed for wheezing or shortness of breath. 75 mL 12 10/26/2015 at Unknown time  . amLODipine (NORVASC) 5 MG tablet TAKE ONE TABLET BY MOUTH DAILY. 30 tablet 6 10/26/2015 at Unknown time  . azithromycin (ZITHROMAX) 250 MG tablet Take 250-500 mg by mouth See admin instructions. TAKE TWO TABLETS ON DAY 1, THEN TAKE ONE TABLETS ON DAYS 2 THROUGH 5 STARTING ON 10/25/2015   10/26/2015 at Unknown time  . budesonide-formoterol (SYMBICORT) 160-4.5 MCG/ACT inhaler Inhale 2 puffs into the lungs 2 (two) times daily.   10/26/2015 at Unknown time  . calcitRIOL (ROCALTROL) 0.25 MCG capsule Take 0.25 mcg by mouth daily.    10/26/2015 at Unknown time   . cloNIDine (CATAPRES) 0.2 MG tablet TAKE (1) TABLET BY MOUTH (3) TIMES DAILY 90 tablet 2 10/26/2015 at Unknown time  . desmopressin (DDAVP) 0.2 MG tablet Take 1 tablet (0.2 mg total) by mouth 2 (two) times daily.  60 tablet 6 10/26/2015 at Unknown time  . dexamethasone (DECADRON) 0.5 MG tablet Take 1 tablet (0.5 mg total) by mouth daily. 30 tablet 6 10/26/2015 at Unknown time  . fluticasone (FLONASE) 50 MCG/ACT nasal spray Place 2 sprays into both nostrils daily.    10/26/2015 at Unknown time  . Fluticasone-Salmeterol (ADVAIR) 250-50 MCG/DOSE AEPB Inhale 1 puff into the lungs 2 (two) times daily.   10/26/2015 at Unknown time  . furosemide (LASIX) 40 MG tablet Take 40 mg by mouth 2 (two) times daily.   10/26/2015 at Unknown time  . levothyroxine (SYNTHROID, LEVOTHROID) 175 MCG tablet Take 1 tablet (175 mcg total) by mouth daily before breakfast. 30 tablet 6 10/26/2015 at Unknown time  . methylPREDNISolone (MEDROL DOSEPAK) 4 MG TBPK tablet Take 4-24 mg by mouth See admin instructions. 6 day course starting on 10/25/2015   10/26/2015 at Unknown time  . metolazone (ZAROXOLYN) 5 MG tablet Take 5 mg by mouth daily.    10/26/2015 at Unknown time  . pantoprazole (PROTONIX) 40 MG tablet Take 1 tablet (40 mg total) by mouth 2 (two) times daily before a meal. (Patient taking differently: Take 40 mg by mouth 2 (two) times daily. ) 60 tablet 3 10/26/2015 at Unknown time  . phenytoin (DILANTIN) 100 MG ER capsule Take 200-300 mg by mouth daily. Takes 200 mg on Monday and Thursdsay. On all other days of the week takes 300 mg.   10/26/2015 at 800A  . polyethylene glycol powder (GLYCOLAX/MIRALAX) powder Take 17 g by mouth daily. May decrease to 17 g three times a week if needed. (Patient taking differently: Take 17 g by mouth daily. May decrease to 17 g three times a week if needed.) 255 g 3 UNKNOWN  . potassium chloride (KLOR-CON) 20 MEQ packet Take 20 mEq by mouth 2 (two) times daily.   10/26/2015 at Unknown time  .  simvastatin (ZOCOR) 20 MG tablet Take 20 mg by mouth daily.    10/26/2015 at Unknown time  . tiotropium (SPIRIVA) 18 MCG inhalation capsule Place 18 mcg into inhaler and inhale daily.   10/26/2015 at Unknown time  . Vitamin D, Ergocalciferol, (DRISDOL) 50000 UNITS CAPS capsule Take 1 capsule (50,000 Units total) by mouth every 7 (seven) days. Monday 12 capsule 0 Past Week at Unknown time   Scheduled: . amLODipine  5 mg Oral Daily  . budesonide-formoterol  2 puff Inhalation BID  . calcitRIOL  0.25 mcg Oral Daily  . cloNIDine  0.2 mg Oral TID  . desmopressin  0.2 mg Oral BID  . dexamethasone  0.5 mg Oral Daily  . enoxaparin (LOVENOX) injection  30 mg Subcutaneous Q24H  . fluticasone  2 spray Each Nare Daily  . furosemide  80 mg Intravenous BID  . levothyroxine  175 mcg Oral QAC breakfast  . metolazone  5 mg Oral Daily  . mometasone-formoterol  2 puff Inhalation BID  . pantoprazole  40 mg Oral BID  . [START ON 10/29/2015] phenytoin  200 mg Oral Once per day on Mon Thu   And  . phenytoin  300 mg Oral Once per day on Sun Tue Wed Fri Sat  . polyethylene glycol  17 g Oral Daily  . potassium chloride  20 mEq Oral BID  . simvastatin  20 mg Oral q1800  . sodium chloride  3 mL Intravenous Q12H  . tiotropium  18 mcg Inhalation Daily  . Vitamin D (Ergocalciferol)  50,000 Units Oral Q7 days   Continuous:  WLN:[id number], HYDROcodone-acetaminophen,  ondansetron [facility]) IV  Assesment: He was admitted with acute on chronic combined systolic and diastolic heart failure. His problem is complicated by multiple comorbidities including COPD which seems pretty stable, obstructive sleep apnea/obesity hypoventilation for which he is noncompliant with treatment, aortic insufficiency and chronic kidney disease. Active Problems:   Moderate aortic regurgitation   Diabetes (HCC)   CKD (chronic kidney disease), stage III   Acute on chronic combined systolic and diastolic CHF (congestive heart failure) (HCC)    Aortic insufficiency   Essential hypertension   Congestive heart failure (CHF) ([place])    Plan: He has not diuresed much so I'm going to increase his diuretics recheck his renal function tomorrow.    LOS: 1 day   HAWKINS,EDWARD L 10/27/2015, 10:39 AM

## 2015-10-28 LAB — BASIC METABOLIC PANEL
ANION GAP: 10 (ref 5–15)
BUN: 62 mg/dL — ABNORMAL HIGH (ref 6–20)
CALCIUM: 8.7 mg/dL — AB (ref 8.9–10.3)
CHLORIDE: 101 mmol/L (ref 101–111)
CO2: 24 mmol/L (ref 22–32)
Creatinine, Ser: 2.64 mg/dL — ABNORMAL HIGH (ref 0.61–1.24)
GFR calc non Af Amer: 23 mL/min — ABNORMAL LOW (ref >60)
GFR, EST AFRICAN AMERICAN: 27 mL/min — AB (ref >60)
Glucose, Bld: 80 mg/dL (ref 65–99)
Potassium: 4.3 mmol/L (ref 3.5–5.1)
SODIUM: 135 mmol/L (ref 135–145)

## 2015-10-28 MED ORDER — ENOXAPARIN SODIUM 40 MG/0.4ML ~~LOC~~ SOLN
40.0000 mg | SUBCUTANEOUS | Status: DC
  Administered 2015-10-28: 40 mg via SUBCUTANEOUS
  Filled 2015-10-28: qty 0.4

## 2015-10-28 MED ORDER — FUROSEMIDE 10 MG/ML IJ SOLN
80.0000 mg | Freq: Two times a day (BID) | INTRAMUSCULAR | Status: DC
  Administered 2015-10-28 – 2015-10-29 (×2): 80 mg via INTRAVENOUS
  Filled 2015-10-28 (×2): qty 8

## 2015-10-28 NOTE — Progress Notes (Signed)
Subjective: He was admitted with acute on chronic combined systolic and diastolic heart failure. [name] now down about 5 L and says he feels better. [name] less swollen. His urine output has been better. He has no new complaints. He denies any chest pain. He refuses to wear C Pap and he is asleep when I come into the room and goes back to sleep almost immediately.  Objective: Vital signs in last 24 hours: Temp:  [98.4 F (36.9 C)-98.8 F (37.1 C)] 98.8 F (37.1 C) (11/20 0500) Pulse Rate:  [83-91] 91 (11/20 0500) Resp:  [20-21] 20 (11/20 0500) BP: (125-144)/(44-58) 144/50 mmHg (11/20 0500) SpO2:  [97 %-98 %] 97 % (11/20 0804) Weight:  [118.797 kg (261 lb 14.4 oz)] 118.797 kg (261 lb 14.4 oz) (11/20 0500) Weight change: -2.087 kg (-4 lb 9.6 oz) Last BM Date: 10/27/15  Intake/Output from previous day: 11/19 0701 - 11/20 0700 In: 480 [P.O.:480] Out: 4850 [Urine:4850]  PHYSICAL EXAM General appearance: morbidly obese and Sleepy Resp: clear to auscultation bilaterally Cardio: regular rate and rhythm, S1, S2 normal, no murmur, click, rub or gallop GI: His abdomen is still distended but less so than yesterday Extremities: edema 2+ and venous stasis dermatitis noted  Lab Results:  Results for orders placed or performed during the hospital encounter of 10/26/15 (from the past 48 hour(s))  CBC with Differential     Status: Abnormal   Collection Time: 10/26/15  4:12 PM  Result Value Ref Range   WBC 8.4 4.0 - 10.5 K/uL   RBC 3.97 (L) 4.22 - 5.81 MIL/uL   Hemoglobin 11.4 (L) 13.0 - 17.0 g/dL   HCT 36.0 (L) 39.0 - 52.0 %   MCV 90.7 78.0 - 100.0 fL   MCH 28.7 26.0 - 34.0 pg   MCHC 31.7 30.0 - 36.0 g/dL   RDW 19.7 (H) 11.5 - 15.5 %   Platelets 157 150 - 400 K/uL   Neutrophils Relative % 61 %   Neutro Abs 5.1 1.7 - 7.7 K/uL   Lymphocytes Relative 10 %   Lymphs Abs 0.8 0.7 - 4.0 K/uL   Monocytes Relative 8 %   Monocytes Absolute 0.7 0.1 - 1.0 K/uL   Eosinophils Relative 20 %   Eosinophils Absolute 1.7 (H) 0.0 - 0.7 K/uL   Basophils Relative 1 %   Basophils Absolute 0.1 0.0 - 0.1 K/uL  Comprehensive metabolic panel     Status: Abnormal   Collection Time: 10/26/15  4:12 PM  Result Value Ref Range   Sodium 137 135 - 145 mmol/L   Potassium 4.9 3.5 - 5.1 mmol/L   Chloride 104 101 - 111 mmol/L   CO2 24 22 - 32 mmol/L   Glucose, Bld 103 (H) 65 - 99 mg/dL   BUN 48 (H) 6 - 20 mg/dL   Creatinine, Ser 2.54 (H) 0.61 - 1.24 mg/dL   Calcium 8.5 (L) 8.9 - 10.3 mg/dL   Total Protein 7.4 6.5 - 8.1 g/dL   Albumin 3.5 3.5 - 5.0 g/dL   AST 22 15 - 41 U/L   ALT 24 17 - 63 U/L   Alkaline Phosphatase 43 38 - 126 U/L   Total Bilirubin 0.7 0.3 - 1.2 mg/dL   GFR calc non Af Amer 24 (L) >60 mL/min   GFR calc Af Amer 28 (L) >60 mL/min    Comment: (NOTE) The eGFR has been calculated using the CKD EPI equation. This calculation has not been validated in all clinical situations. eGFR's persistently <  60 mL/min signify possible Chronic Kidney Disease.    Anion gap 9 5 - 15  Brain natriuretic peptide     Status: Abnormal   Collection Time: 10/26/15  4:12 PM  Result Value Ref Range   B Natriuretic Peptide 781.0 (H) 0.0 - 100.0 pg/mL  Troponin I     Status: None   Collection Time: 10/26/15  4:12 PM  Result Value Ref Range   Troponin I <0.03 <0.031 ng/mL    Comment:        NO INDICATION OF MYOCARDIAL INJURY.   Basic metabolic panel     Status: Abnormal   Collection Time: 10/27/15  5:01 AM  Result Value Ref Range   Sodium 135 135 - 145 mmol/L   Potassium 5.5 (H) 3.5 - 5.1 mmol/L   Chloride 105 101 - 111 mmol/L   CO2 21 (L) 22 - 32 mmol/L   Glucose, Bld 95 65 - 99 mg/dL   BUN 52 (H) 6 - 20 mg/dL   Creatinine, Ser 2.51 (H) 0.61 - 1.24 mg/dL   Calcium 8.8 (L) 8.9 - 10.3 mg/dL   GFR calc non Af Amer 24 (L) >60 mL/min   GFR calc Af Amer 28 (L) >60 mL/min    Comment: (NOTE) The eGFR has been calculated using the CKD EPI equation. This calculation has not been validated in all  clinical situations. eGFR's persistently <60 mL/min signify possible Chronic Kidney Disease.    Anion gap 9 5 - 15  MRSA PCR Screening     Status: None   Collection Time: 10/27/15  6:10 PM  Result Value Ref Range   MRSA by PCR NEGATIVE NEGATIVE    Comment:        The GeneXpert MRSA Assay (FDA approved for NASAL specimens only), is one component of a comprehensive MRSA colonization surveillance program. It is not intended to diagnose MRSA infection nor to guide or monitor treatment for MRSA infections.   Basic metabolic panel     Status: Abnormal   Collection Time: 10/28/15  6:26 AM  Result Value Ref Range   Sodium 135 135 - 145 mmol/L   Potassium 4.3 3.5 - 5.1 mmol/L    Comment: DELTA CHECK NOTED   Chloride 101 101 - 111 mmol/L   CO2 24 22 - 32 mmol/L   Glucose, Bld 80 65 - 99 mg/dL   BUN 62 (H) 6 - 20 mg/dL   Creatinine, Ser 2.64 (H) 0.61 - 1.24 mg/dL   Calcium 8.7 (L) 8.9 - 10.3 mg/dL   GFR calc non Af Amer 23 (L) >60 mL/min   GFR calc Af Amer 27 (L) >60 mL/min    Comment: (NOTE) The eGFR has been calculated using the CKD EPI equation. This calculation has not been validated in all clinical situations. eGFR's persistently <60 mL/min signify possible Chronic Kidney Disease.    Anion gap 10 5 - 15    ABGS No results for input(s): PHART, PO2ART, TCO2, HCO3 in the last 72 hours.  Invalid input(s): PCO2 CULTURES Recent Results (from the past 240 hour(s))  MRSA PCR Screening     Status: None   Collection Time: 10/27/15  6:10 PM  Result Value Ref Range Status   MRSA by PCR NEGATIVE NEGATIVE Final    Comment:        The GeneXpert MRSA Assay (FDA approved for NASAL specimens only), is one component of a comprehensive MRSA colonization surveillance program. It is not intended to diagnose MRSA infection nor to guide  or monitor treatment for MRSA infections.    Studies/Results: Dg Chest Portable 1 View  10/26/2015  CLINICAL DATA:  Patient with bilateral lower  extremity swelling and chest pain. Shortness of breath for 1 week. EXAM: PORTABLE CHEST 1 VIEWm COMPARISON:  Chest radiograph 09/04/2015 FINDINGS: Monitoring leads overlie the patient. Marked cardiomegaly. Interval development of bilateral mid and lower lung airspace opacities, right greater left. Possible small bilateral pleural effusions. IMPRESSION: Marked cardiomegaly. Bilateral mid and lower lung airspace opacities, right greater than left. This may represent pulmonary edema. Infection and/or aspiration are additional considerations. Possible small bilateral pleural effusions. Electronically Signed   By: Lovey Newcomer M.D.   On: 10/26/2015 16:15    Medications:  Prior to Admission:  Prescriptions prior to admission  Medication Sig Dispense Refill Last Dose  . albuterol (PROVENTIL) (2.5 MG/3ML) 0.083% nebulizer solution Take 3 mLs (2.5 mg total) by nebulization every 4 (four) hours as needed for wheezing or shortness of breath. 75 mL 12 10/26/2015 at Unknown time  . amLODipine (NORVASC) 5 MG tablet TAKE ONE TABLET BY MOUTH DAILY. 30 tablet 6 10/26/2015 at Unknown time  . azithromycin (ZITHROMAX) 250 MG tablet Take 250-500 mg by mouth See admin instructions. TAKE TWO TABLETS ON DAY 1, THEN TAKE ONE TABLETS ON DAYS 2 THROUGH 5 STARTING ON 10/25/2015   10/26/2015 at Unknown time  . budesonide-formoterol (SYMBICORT) 160-4.5 MCG/ACT inhaler Inhale 2 puffs into the lungs 2 (two) times daily.   10/26/2015 at Unknown time  . calcitRIOL (ROCALTROL) 0.25 MCG capsule Take 0.25 mcg by mouth daily.    10/26/2015 at Unknown time  . cloNIDine (CATAPRES) 0.2 MG tablet TAKE (1) TABLET BY MOUTH (3) TIMES DAILY 90 tablet 2 10/26/2015 at Unknown time  . desmopressin (DDAVP) 0.2 MG tablet Take 1 tablet (0.2 mg total) by mouth 2 (two) times daily. 60 tablet 6 10/26/2015 at Unknown time  . dexamethasone (DECADRON) 0.5 MG tablet Take 1 tablet (0.5 mg total) by mouth daily. 30 tablet 6 10/26/2015 at Unknown time  .  fluticasone (FLONASE) 50 MCG/ACT nasal spray Place 2 sprays into both nostrils daily.    10/26/2015 at Unknown time  . Fluticasone-Salmeterol (ADVAIR) 250-50 MCG/DOSE AEPB Inhale 1 puff into the lungs 2 (two) times daily.   10/26/2015 at Unknown time  . furosemide (LASIX) 40 MG tablet Take 40 mg by mouth 2 (two) times daily.   10/26/2015 at Unknown time  . levothyroxine (SYNTHROID, LEVOTHROID) 175 MCG tablet Take 1 tablet (175 mcg total) by mouth daily before breakfast. 30 tablet 6 10/26/2015 at Unknown time  . methylPREDNISolone (MEDROL DOSEPAK) 4 MG TBPK tablet Take 4-24 mg by mouth See admin instructions. 6 day course starting on 10/25/2015   10/26/2015 at Unknown time  . metolazone (ZAROXOLYN) 5 MG tablet Take 5 mg by mouth daily.    10/26/2015 at Unknown time  . pantoprazole (PROTONIX) 40 MG tablet Take 1 tablet (40 mg total) by mouth 2 (two) times daily before a meal. (Patient taking differently: Take 40 mg by mouth 2 (two) times daily. ) 60 tablet 3 10/26/2015 at Unknown time  . phenytoin (DILANTIN) 100 MG ER capsule Take 200-300 mg by mouth daily. Takes 200 mg on Monday and Thursdsay. On all other days of the week takes 300 mg.   10/26/2015 at 800A  . polyethylene glycol powder (GLYCOLAX/MIRALAX) powder Take 17 g by mouth daily. May decrease to 17 g three times a week if needed. (Patient taking differently: Take 17 g by mouth daily.  May decrease to 17 g three times a week if needed.) 255 g 3 UNKNOWN  . potassium chloride (KLOR-CON) 20 MEQ packet Take 20 mEq by mouth 2 (two) times daily.   10/26/2015 at Unknown time  . simvastatin (ZOCOR) 20 MG tablet Take 20 mg by mouth daily.    10/26/2015 at Unknown time  . tiotropium (SPIRIVA) 18 MCG inhalation capsule Place 18 mcg into inhaler and inhale daily.   10/26/2015 at Unknown time  . Vitamin D, Ergocalciferol, (DRISDOL) 50000 UNITS CAPS capsule Take 1 capsule (50,000 Units total) by mouth every 7 (seven) days. Monday 12 capsule 0 Past Week at Unknown  time   Scheduled: . amLODipine  5 mg Oral Daily  . budesonide-formoterol  2 puff Inhalation BID  . calcitRIOL  0.25 mcg Oral Daily  . cloNIDine  0.2 mg Oral TID  . desmopressin  0.2 mg Oral BID  . dexamethasone  0.5 mg Oral Daily  . enoxaparin (LOVENOX) injection  30 mg Subcutaneous Q24H  . fluticasone  2 spray Each Nare Daily  . furosemide  80 mg Intravenous 3 times per day  . levothyroxine  175 mcg Oral QAC breakfast  . metolazone  5 mg Oral Daily  . mometasone-formoterol  2 puff Inhalation BID  . pantoprazole  40 mg Oral BID  . [START ON 10/29/2015] phenytoin  200 mg Oral Once per day on Mon Thu   And  . phenytoin  300 mg Oral Once per day on Sun Tue Wed Fri Sat  . polyethylene glycol  17 g Oral Daily  . simvastatin  20 mg Oral q1800  . sodium chloride  3 mL Intravenous Q12H  . tiotropium  18 mcg Inhalation Daily  . Vitamin D (Ergocalciferol)  50,000 Units Oral Q7 days   Continuous:  STM:[id number], HYDROcodone-acetaminophen, ondansetron (ZOFRAN) IV  Assesment: He was admitted with acute on chronic combined systolic and diastolic heart failure. [name] better. [name] down about 5 L. He still has excess fluid. His renal function is not quite as good today so I'm going to plan to decrease his diuresis slightly.  He has sleep apnea and refuses C Pap which may be causing some of his problem.  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  He has hypertension which is relatively well controlled Active Problems:   Panhypopituitarism (HCC)   Moderate aortic regurgitation   Diabetes (HCC)   CKD (chronic kidney disease), stage III   Acute on chronic combined systolic and diastolic CHF (congestive heart failure) (HCC)   Aortic insufficiency   Essential hypertension   Morbid obesity due to excess calories (HCC)   Congestive heart failure (CHF) ([place])    Plan: Reduce his Lasix back to  twice a day. Recheck basic metabolic profile in the morning    LOS: 2 days   Amadi Yoshino L 10/28/2015, 10:10 AM

## 2015-10-29 LAB — BASIC METABOLIC PANEL
Anion gap: 10 (ref 5–15)
BUN: 74 mg/dL — AB (ref 6–20)
CALCIUM: 8.6 mg/dL — AB (ref 8.9–10.3)
CO2: 26 mmol/L (ref 22–32)
Chloride: 98 mmol/L — ABNORMAL LOW (ref 101–111)
Creatinine, Ser: 2.74 mg/dL — ABNORMAL HIGH (ref 0.61–1.24)
GFR calc Af Amer: 25 mL/min — ABNORMAL LOW (ref >60)
GFR, EST NON AFRICAN AMERICAN: 22 mL/min — AB (ref >60)
GLUCOSE: 81 mg/dL (ref 65–99)
Potassium: 3.8 mmol/L (ref 3.5–5.1)
Sodium: 134 mmol/L — ABNORMAL LOW (ref 135–145)

## 2015-10-29 MED ORDER — ENOXAPARIN SODIUM 30 MG/0.3ML ~~LOC~~ SOLN
30.0000 mg | SUBCUTANEOUS | Status: DC
  Administered 2015-10-29 – 2015-10-31 (×3): 30 mg via SUBCUTANEOUS
  Filled 2015-10-29 (×3): qty 0.3

## 2015-10-29 MED ORDER — FUROSEMIDE 40 MG PO TABS
40.0000 mg | ORAL_TABLET | Freq: Every day | ORAL | Status: DC
  Administered 2015-10-29: 40 mg via ORAL
  Filled 2015-10-29: qty 1

## 2015-10-29 NOTE — Progress Notes (Signed)
Subjective: Patient was admitted due to worsening leg edema and shortness of breath as case COPD exacerbation and CHF. [name] on high dose IV duiretic. However his renal function is getting worse.  Objective: Vital signs in last 24 hours: Temp:  [97.6 F (36.4 C)-98.3 F (36.8 C)] 98.3 F (36.8 C) (11/21 0628) Pulse Rate:  [86-93] 93 (11/21 0628) Resp:  [18-20] 20 (11/21 0628) BP: (127-145)/(44-62) 127/44 mmHg (11/21 0628) SpO2:  [92 %-97 %] 92 % (11/21 0818) Weight:  [115.713 kg (255 lb 1.6 oz)] 115.713 kg (255 lb 1.6 oz) (11/21 3295) Weight change: -3.084 kg (-6 lb 12.8 oz) Last BM Date: 10/27/15  Intake/Output from previous day: 11/20 0701 - 11/21 0700 In: 1200 [P.O.:1200] Out: 3600 [Urine:3600]  PHYSICAL EXAM General appearance: alert and moderate distress Resp: diminished breath sounds bilaterally and rhonchi bilaterally Cardio: S1, S2 normal GI: soft, non-tender; bowel sounds normal; no masses,  no organomegaly Extremities: edema 2++ leg edema  Lab Results:  Results for orders placed or performed during the hospital encounter of 10/26/15 (from the past 48 hour(s))  MRSA PCR Screening     Status: None   Collection Time: 10/27/15  6:10 PM  Result Value Ref Range   MRSA by PCR NEGATIVE NEGATIVE    Comment:        The GeneXpert MRSA Assay (FDA approved for NASAL specimens only), is one component of a comprehensive MRSA colonization surveillance program. It is not intended to diagnose MRSA infection nor to guide or monitor treatment for MRSA infections.   Basic metabolic panel     Status: Abnormal   Collection Time: 10/28/15  6:26 AM  Result Value Ref Range   Sodium 135 135 - 145 mmol/L   Potassium 4.3 3.5 - 5.1 mmol/L    Comment: DELTA CHECK NOTED   Chloride 101 101 - 111 mmol/L   CO2 24 22 - 32 mmol/L   Glucose, Bld 80 65 - 99 mg/dL   BUN 62 (H) 6 - 20 mg/dL   Creatinine, Ser 2.64 (H) 0.61 - 1.24 mg/dL   Calcium 8.7 (L) 8.9 - 10.3 mg/dL   GFR calc non Af  Amer 23 (L) >60 mL/min   GFR calc Af Amer 27 (L) >60 mL/min    Comment: (NOTE) The eGFR has been calculated using the CKD EPI equation. This calculation has not been validated in all clinical situations. eGFR's persistently <60 mL/min signify possible Chronic Kidney Disease.    Anion gap 10 5 - 15  Basic metabolic panel     Status: Abnormal   Collection Time: 10/29/15  7:03 AM  Result Value Ref Range   Sodium 134 (L) 135 - 145 mmol/L   Potassium 3.8 3.5 - 5.1 mmol/L   Chloride 98 (L) 101 - 111 mmol/L   CO2 26 22 - 32 mmol/L   Glucose, Bld 81 65 - 99 mg/dL   BUN 74 (H) 6 - 20 mg/dL   Creatinine, Ser 2.74 (H) 0.61 - 1.24 mg/dL   Calcium 8.6 (L) 8.9 - 10.3 mg/dL   GFR calc non Af Amer 22 (L) >60 mL/min   GFR calc Af Amer 25 (L) >60 mL/min    Comment: (NOTE) The eGFR has been calculated using the CKD EPI equation. This calculation has not been validated in all clinical situations. eGFR's persistently <60 mL/min signify possible Chronic Kidney Disease.    Anion gap 10 5 - 15    ABGS No results for input(s): PHART, PO2ART, TCO2, HCO3 in  the last 72 hours.  Invalid input(s): PCO2 CULTURES Recent Results (from the past 240 hour(s))  MRSA PCR Screening     Status: None   Collection Time: 10/27/15  6:10 PM  Result Value Ref Range Status   MRSA by PCR NEGATIVE NEGATIVE Final    Comment:        The GeneXpert MRSA Assay (FDA approved for NASAL specimens only), is one component of a comprehensive MRSA colonization surveillance program. It is not intended to diagnose MRSA infection nor to guide or monitor treatment for MRSA infections.    Studies/Results: No results found.  Medications: I have reviewed the patient's current medications.  Assesment:   Active Problems:   Panhypopituitarism (HCC)   Moderate aortic regurgitation   Diabetes (HCC)   CKD (chronic kidney disease), stage III   Acute on chronic combined systolic and diastolic CHF (congestive heart failure)  (HCC)   Aortic insufficiency   Essential hypertension   Morbid obesity due to excess calories (HCC)   Congestive heart failure (CHF) ([place])    Plan:  Medications reviewed Will D/C IV lasix and started lasix 40 mg po daily Will repeat BMP Continue current treatment    LOS: 3 days   FANTA,TESFAYE 10/29/2015, 8:20 AM
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
## 2015-10-29 NOTE — Care Management Note (Signed)
Case Management Note  Patient Details  Name: [name] [name] MRN: [id number] Date of Birth: 06/22/1946  Subjective/Objective:                  Pt admitted from home with CHF. Pt lives alone and will return home at discharge. Pt is independent with ADL's. Pt has a daughter who is very active in the care of the pt. Pt has scales for home use.  Action/Plan: CM encouraged pt to weigh daily and record weights. Pt would benefit from [facility] RN at discharge. Will continue to follow for discharge planning needs.  Expected Discharge Date:                  Expected Discharge Plan:  Home/Self Care  In-House Referral:  NA  Discharge planning Services  CM Consult  Post Acute Care Choice:  Home Health Choice offered to:  Patient  DME Arranged:    DME Agency:     HH Arranged:    [place] Agency:     Status of Service:  In process, will continue to follow  Medicare Important Message Given:    Date Medicare IM Given:    Medicare IM give by:    Date Additional Medicare IM Given:    Additional Medicare Important Message give by:     If discussed at [place] of Stay Meetings, dates discussed:    Additional Comments:  Joylene Draft, RN 10/29/2015, 2:09 PM

## 2015-10-30 LAB — BASIC METABOLIC PANEL
Anion gap: 9 (ref 5–15)
BUN: 71 mg/dL — ABNORMAL HIGH (ref 6–20)
CHLORIDE: 97 mmol/L — AB (ref 101–111)
CO2: 29 mmol/L (ref 22–32)
CREATININE: 2.5 mg/dL — AB (ref 0.61–1.24)
Calcium: 8.9 mg/dL (ref 8.9–10.3)
GFR calc non Af Amer: 25 mL/min — ABNORMAL LOW (ref >60)
GFR, EST AFRICAN AMERICAN: 28 mL/min — AB (ref >60)
Glucose, Bld: 93 mg/dL (ref 65–99)
POTASSIUM: 4.2 mmol/L (ref 3.5–5.1)
Sodium: 135 mmol/L (ref 135–145)

## 2015-10-30 MED ORDER — LORATADINE 10 MG PO TABS
10.0000 mg | ORAL_TABLET | Freq: Every day | ORAL | Status: DC
  Administered 2015-10-30 – 2015-11-01 (×3): 10 mg via ORAL
  Filled 2015-10-30 (×3): qty 1

## 2015-10-30 MED ORDER — FUROSEMIDE 10 MG/ML IJ SOLN
80.0000 mg | Freq: Every day | INTRAMUSCULAR | Status: DC
  Administered 2015-10-30: 40 mg via INTRAVENOUS
  Filled 2015-10-30: qty 8

## 2015-10-30 NOTE — Progress Notes (Signed)
Subjective: Patient is resting. He feels better. His has still bilateral leg edema. [name] duiresing well. Cardiology as recommended to continue IV diuretics..  Objective: Vital signs in last 24 hours: Temp:  [97.7 F (36.5 C)-98.7 F (37.1 C)] 98.2 F (36.8 C) (11/22 0620) Pulse Rate:  [80-82] 82 (11/22 0620) Resp:  [20] 20 (11/22 0620) BP: (130-141)/(45-55) 133/55 mmHg (11/22 0620) SpO2:  [93 %-98 %] 94 % (11/22 0747) Weight:  [115.713 kg (255 lb 1.6 oz)] 115.713 kg (255 lb 1.6 oz) (11/22 0620) Weight change: 0 kg (0 lb) Last BM Date: 10/27/15  Intake/Output from previous day: 11/21 0701 - 11/22 0700 In: 483 [P.O.:480; I.V.:3] Out: 3000 [Urine:3000]  PHYSICAL EXAM General appearance: alert and moderate distress Resp: diminished breath sounds bilaterally and rhonchi bilaterally Cardio: S1, S2 normal GI: soft, non-tender; bowel sounds normal; no masses,  no organomegaly Extremities: edema 2++ leg edema  Lab Results:  Results for orders placed or performed during the hospital encounter of 10/26/15 (from the past 48 hour(s))  Basic metabolic panel     Status: Abnormal   Collection Time: 10/29/15  7:03 AM  Result Value Ref Range   Sodium 134 (L) 135 - 145 mmol/L   Potassium 3.8 3.5 - 5.1 mmol/L   Chloride 98 (L) 101 - 111 mmol/L   CO2 26 22 - 32 mmol/L   Glucose, Bld 81 65 - 99 mg/dL   BUN 74 (H) 6 - 20 mg/dL   Creatinine, Ser 2.74 (H) 0.61 - 1.24 mg/dL   Calcium 8.6 (L) 8.9 - 10.3 mg/dL   GFR calc non Af Amer 22 (L) >60 mL/min   GFR calc Af Amer 25 (L) >60 mL/min    Comment: (NOTE) The eGFR has been calculated using the CKD EPI equation. This calculation has not been validated in all clinical situations. eGFR's persistently <60 mL/min signify possible Chronic Kidney Disease.    Anion gap 10 5 - 15  Basic metabolic panel     Status: Abnormal   Collection Time: 10/30/15  6:30 AM  Result Value Ref Range   Sodium 135 135 - 145 mmol/L   Potassium 4.2 3.5 - 5.1 mmol/L    Chloride 97 (L) 101 - 111 mmol/L   CO2 29 22 - 32 mmol/L   Glucose, Bld 93 65 - 99 mg/dL   BUN 71 (H) 6 - 20 mg/dL   Creatinine, Ser 2.50 (H) 0.61 - 1.24 mg/dL   Calcium 8.9 8.9 - 10.3 mg/dL   GFR calc non Af Amer 25 (L) >60 mL/min   GFR calc Af Amer 28 (L) >60 mL/min    Comment: (NOTE) The eGFR has been calculated using the CKD EPI equation. This calculation has not been validated in all clinical situations. eGFR's persistently <60 mL/min signify possible Chronic Kidney Disease.    Anion gap 9 5 - 15    ABGS No results for input(s): PHART, PO2ART, TCO2, HCO3 in the last 72 hours.  Invalid input(s): PCO2 CULTURES Recent Results (from the past 240 hour(s))  MRSA PCR Screening     Status: None   Collection Time: 10/27/15  6:10 PM  Result Value Ref Range Status   MRSA by PCR NEGATIVE NEGATIVE Final    Comment:        The GeneXpert MRSA Assay (FDA approved for NASAL specimens only), is one component of a comprehensive MRSA colonization surveillance program. It is not intended to diagnose MRSA infection nor to guide or monitor treatment for MRSA infections.  Studies/Results: No results found.  Medications: I have reviewed the patient's current medications.  Assesment:   Active Problems:   Panhypopituitarism (HCC)   Moderate aortic regurgitation   Diabetes (HCC)   CKD (chronic kidney disease), stage III   Acute on chronic combined systolic and diastolic CHF (congestive heart failure) (HCC)   Aortic insufficiency   Essential hypertension   Morbid obesity due to excess calories (HCC)   Congestive heart failure (CHF) ([place])    Plan:  Medications reviewed Continue IV lasix as per cardiology recommendation Will repeat BMP Continue current treatment    LOS: 4 days   Ashleigh Luckow 10/30/2015, 8:29 AM

## 2015-10-30 NOTE — Progress Notes (Signed)
Consulting cardiologist: Carlyle Dolly MD Primary Cardiologist: Bronson Ing. Jamesetta So MD  Cardiology Specific Problem List: 1.Acute on Chronic Mixed CHF 2. Hypertension   Subjective:   Feeling better but still with LEE. Not ready to go home.    Objective:   Temp:  [97.7 F (36.5 C)-98.7 F (37.1 C)] 98.2 F (36.8 C) (11/22 0620) Pulse Rate:  [80-82] 82 (11/22 0620) Resp:  [20] 20 (11/22 0620) BP: (130-141)/(45-55) 133/55 mmHg (11/22 0620) SpO2:  [93 %-98 %] 94 % (11/22 0747) Weight:  [255 lb 1.6 oz (115.713 kg)] 255 lb 1.6 oz (115.713 kg) (11/22 0620) Last BM Date: 10/27/15  Filed Weights   10/28/15 0500 10/29/15 0628 10/30/15 0620  Weight: 261 lb 14.4 oz (118.797 kg) 255 lb 1.6 oz (115.713 kg) 255 lb 1.6 oz (115.713 kg)    Intake/Output Summary (Last 24 hours) at 10/30/15 0821 Last data filed at 10/30/15 [id number]  Gross per 24 hour  Intake    483 ml  Output   3000 ml  Net  -2517 ml    Telemetry: NSR with LVH.   Exam:  General: No acute distress.  HEENT: Conjunctiva and lids normal, oropharynx clear.  Lungs: Clear to auscultation, nonlabored.  Cardiac: No elevated JVP or bruits. RRR, no gallop or rub.   Abdomen: Normoactive bowel sounds, nontender, nondistended.  Extremities: 1+-2+  pitting edema, distal pulses diminished   Neuropsychiatric: Alert and oriented x3, affect appropriate.   Lab Results:  Basic Metabolic Panel:  Recent Labs Lab 10/28/15 0626 10/29/15 0703 10/30/15 0630  NA 135 134* 135  K 4.3 3.8 4.2  CL 101 98* 97*  CO2 24 26 29   GLUCOSE 80 81 93  BUN 62* 74* 71*  CREATININE 2.64* 2.74* 2.50*  CALCIUM 8.7* 8.6* 8.9    Liver Function Tests:  Recent Labs Lab 10/26/15 1612  AST 22  ALT 24  ALKPHOS 43  BILITOT 0.7  PROT 7.4  ALBUMIN 3.5    CBC:  Recent Labs Lab 10/26/15 1612  WBC 8.4  HGB 11.4*  HCT 36.0*  MCV 90.7  PLT 157    Cardiac Enzymes:  Recent Labs Lab 10/26/15 1612  TROPONINI <0.03      Medications:   Scheduled Medications: . amLODipine  5 mg Oral Daily  . budesonide-formoterol  2 puff Inhalation BID  . calcitRIOL  0.25 mcg Oral Daily  . cloNIDine  0.2 mg Oral TID  . desmopressin  0.2 mg Oral BID  . dexamethasone  0.5 mg Oral Daily  . enoxaparin (LOVENOX) injection  30 mg Subcutaneous Q24H  . fluticasone  2 spray Each Nare Daily  . furosemide  40 mg Oral Daily  . levothyroxine  175 mcg Oral QAC breakfast  . metolazone  5 mg Oral Daily  . mometasone-formoterol  2 puff Inhalation BID  . pantoprazole  40 mg Oral BID  . phenytoin  200 mg Oral Once per day on Mon Thu   And  . phenytoin  300 mg Oral Once per day on Sun Tue Wed Fri Sat  . polyethylene glycol  17 g Oral Daily  . simvastatin  20 mg Oral q1800  . sodium chloride  3 mL Intravenous Q12H  . tiotropium  18 mcg Inhalation Daily  . Vitamin D (Ergocalciferol)  50,000 Units Oral Q7 days      PRN Medications: albuterol, HYDROcodone-acetaminophen, ondansetron (ZOFRAN) IV   Assessment and Plan:   1.Acute on Chronic Mixed CHF: Still has evidence of fluid overload. Will need  another day of IV diureses. Creatinine improving with diureses. Wt is down 11 lbs but would diurese another 5-7 lbs before release. He has been placed on a fluid restriction but this is not being followed by staff. I have spoken with nurse caring for him who will put signs up. Can transition to po torsemide in am.   2. Hypertension: On amlodipine 5 mg daily and clonidine BP controlled.   3, Dietary non-compliance; He continues to eat out and not watch salt intake. This is main issue with decompensation. We have discussed and he verbalizes understanding.     Phill Myron. Lawrence NP AACC  10/30/2015, 8:21 AM   Patient seen and discussed with NP Purcell Nails, I agree with her documentation. Admitted with acute on chronic combined systolic/diastolic HF. Echo LVEF 45-50%, PASP 84, diastolic function not described. Negative 2.5 liters  yesterday, negative 9.9 liters since admission. [name] on lasix 80mg  IV daily with metolazone 5mg  daily, Cr trending down consistent with venous congestion and CHF. Still volume overloaded, anticipate at least 1-2 more days of IV diuretics.   Zandra Abts MD

## 2015-10-30 NOTE — Plan of Care (Signed)
Problem: Food- and Nutrition-Related Knowledge Deficit (NB-1.1) Goal: Nutrition education Formal process to instruct or train a patient/client in a skill or to impart knowledge to help patients/clients voluntarily manage or modify food choices and eating behavior to maintain or improve health. Outcome: Completed/Met Date Met:  10/30/15 Nutrition Education Note  [name] [name] consulted for nutrition education regarding CHF.  [name] [name] provided "Heart Failure Nutrition Therapy" handout from the Academy of [employer] as well as a list of high sodium foods.   Brief Dietary Recall Breakfast: Eggs + Berniece Salines +coffee Lunch: TV dinner  Dinner: TV dinner /canned veggies Snacks: None, but eats a lot of bread Eats out: Frequently (unable to give # of times per week)  Reviewed patient's dietary recall. Provided examples on ways to decrease sodium intake in diet.  His biggest issues were his intake of frozen meals and his habit of eating out. Explained that frozen meals, canned items, and anything preserved will have excess sodium. He states he rinses his canned veggies. I reccommended eating fresh meats. He states he sometimes boils chicken and makes his own sandwich. Told pt this is an immensely better choice than the frozen dinners. Advised to switch to frozen/dried legumes, vegetables etc.   Discouraged intake of processed foods and use of salt shaker. He denies using the salt shaker.  Encouraged fresh fruits and vegetables. e. He says he really does not eat much vegetables. He says he eats a lot of bread, reccomended avoiding high salt breas like biscuits and eating whole grain sources of carbohydrates to maximize fiber intake  His main reason for eating out frequently is the convenience. He does not feel like cooking when he goes home. I encouraged him to spend 1 day a week spending a large amount of time making and freezing meals so that throughout the week his meals are already prepared, thus making them  even more convenient than fast food. He states he does this?   [name] [name] discussed why it is important for patient to adhere to diet recommendations, and emphasized the role of fluids, foods to avoid, and importance of weighing self daily. He states he does not self weigh. Encouraged to do this and use it as a "report card" to determine how well he is avoiding sodium. Briefly discussed restricting fluids and strategies to quench thirst without drinking ie chewing gum, mints, candies etc. Teach back method used.  Expect poor-fair compliance. Did not seem interested at all in hearing what I had to say. Seemed to intermittently doze off.   Body mass index is 33.66 kg/(m^2). Pt meets criteria for Obese based on current BMI.  Current diet order is HH w/ 1200 ml fluid restricition, patient is consuming approximately 100% of meals at this time. Labs and medications reviewed. No further nutrition interventions warranted at this time. [name] [name] contact information provided. If additional nutrition issues arise, please re-consult [name] [name].   [name] [name] [name] [name], LDN Nutrition Pager: [phone] 10/30/2015 10:51 AM

## 2015-10-31 LAB — BASIC METABOLIC PANEL
ANION GAP: 9 (ref 5–15)
BUN: 70 mg/dL — ABNORMAL HIGH (ref 6–20)
CALCIUM: 8.9 mg/dL (ref 8.9–10.3)
CO2: 28 mmol/L (ref 22–32)
CREATININE: 2.47 mg/dL — AB (ref 0.61–1.24)
Chloride: 98 mmol/L — ABNORMAL LOW (ref 101–111)
GFR, EST AFRICAN AMERICAN: 29 mL/min — AB (ref >60)
GFR, EST NON AFRICAN AMERICAN: 25 mL/min — AB (ref >60)
Glucose, Bld: 83 mg/dL (ref 65–99)
Potassium: 4.1 mmol/L (ref 3.5–5.1)
SODIUM: 135 mmol/L (ref 135–145)

## 2015-10-31 MED ORDER — FUROSEMIDE 10 MG/ML IJ SOLN: 80.0000 mg | Freq: Two times a day (BID) | INTRAMUSCULAR | Status: DC

## 2015-10-31 MED ORDER — FUROSEMIDE 10 MG/ML IJ SOLN
80.0000 mg | Freq: Every day | INTRAMUSCULAR | Status: DC
  Administered 2015-10-31 – 2015-11-01 (×2): 80 mg via INTRAVENOUS
  Filled 2015-10-31 (×2): qty 8

## 2015-10-31 NOTE — Progress Notes (Signed)
Patient: [name] [name] / Admit Date: 10/26/2015 / Date of Encounter: 10/31/2015, 8:48 AM   Subjective: "I feel alright." Leg swelling slowly going down, not yet all the way. No SOB. No CP.   Objective: Telemetry: NSR with occasional PVCs Physical Exam: Blood pressure 118/58, pulse 88, temperature 98.2 F (36.8 C), temperature source Oral, resp. rate 20, height 6\' 1"  (1.854 m), weight 254 lb 11.2 oz (115.531 kg), SpO2 100 %. General: Well developed, obese [name] in no acute distress. Head: Normocephalic, atraumatic, sclera non-icteric, no xanthomas, nares are without discharge. Neck: JVP not elevated. Lungs: Clear bilaterally to auscultation without wheezes, rales, or rhonchi. Breathing is unlabored. Heart: RRR S1 S2 without murmurs, rubs, or gallops.  Abdomen: Soft, non-tender, non-distended with normoactive bowel sounds. No rebound/guarding. Extremities: No clubbing or cyanosis. 1-2+ BLE LE edema with chronic venous stasis skin changes. Distal pedal pulses diminished Neuro: Alert and oriented X 3. Moves all extremities spontaneously. Psych:  Responds to questions appropriately with a normal affect.   Intake/Output Summary (Last 24 hours) at 10/31/15 0848 Last data filed at 10/31/15 0522  Gross per 24 hour  Intake    723 ml  Output   2575 ml  Net  -1852 ml    Inpatient Medications:  . amLODipine  5 mg Oral Daily  . budesonide-formoterol  2 puff Inhalation BID  . calcitRIOL  0.25 mcg Oral Daily  . cloNIDine  0.2 mg Oral TID  . desmopressin  0.2 mg Oral BID  . dexamethasone  0.5 mg Oral Daily  . enoxaparin (LOVENOX) injection  30 mg Subcutaneous Q24H  . fluticasone  2 spray Each Nare Daily  . furosemide  80 mg Intravenous BID  . levothyroxine  175 mcg Oral QAC breakfast  . loratadine  10 mg Oral Daily  . metolazone  5 mg Oral Daily  . mometasone-formoterol  2 puff Inhalation BID  . pantoprazole  40 mg Oral BID  . phenytoin  200 mg Oral Once per day on Mon Thu   And  .  phenytoin  300 mg Oral Once per day on Sun Tue Wed Fri Sat  . polyethylene glycol  17 g Oral Daily  . simvastatin  20 mg Oral q1800  . sodium chloride  3 mL Intravenous Q12H  . tiotropium  18 mcg Inhalation Daily  . Vitamin D (Ergocalciferol)  50,000 Units Oral Q7 days   Infusions:    Labs:  Recent Labs  10/30/15 0630 10/31/15 0632  NA 135 135  K 4.2 4.1  CL 97* 98*  CO2 29 28  GLUCOSE 93 83  BUN 71* 70*  CREATININE 2.50* 2.47*  CALCIUM 8.9 8.9   No results for input(s): AST, ALT, ALKPHOS, BILITOT, PROT, ALBUMIN in the last 72 hours. No results for input(s): WBC, NEUTROABS, HGB, HCT, MCV, PLT in the last 72 hours. No results for input(s): CKTOTAL, CKMB, TROPONINI in the last 72 hours. Invalid input(s): POCBNP No results for input(s): HGBA1C in the last 72 hours.   Radiology/Studies:  Dg Chest Portable 1 View  10/26/2015  CLINICAL DATA:  Patient with bilateral lower extremity swelling and chest pain. Shortness of breath for 1 week. EXAM: PORTABLE CHEST 1 VIEWm COMPARISON:  Chest radiograph 09/04/2015 FINDINGS: Monitoring leads overlie the patient. Marked cardiomegaly. Interval development of bilateral mid and lower lung airspace opacities, right greater left. Possible small bilateral pleural effusions. IMPRESSION: Marked cardiomegaly. Bilateral mid and lower lung airspace opacities, right greater than left. This may represent pulmonary edema. Infection  and/or aspiration are additional considerations. Possible small bilateral pleural effusions. Electronically Signed   By: Lovey Newcomer M.D.   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 Echo 10/27/15: EF 45-50%, mod LVH, RV pressure overload, mildly dilated aortic root, mod AI/MR/TR, severe pulm HTN PASP 44mmHg.  1. Acute on chronic combined CHF - not on BB 2/2 severe COPD and not on ACEI 2/2 CKD. Being managed with IV diuretics. Reinforcement of sodium restriction. -12.3L. He still appears volume overloaded and continues to diurese briskly with stable renal function. Would continue IV Lasix + metolazone another day. Today internal medicine has changed IV Lasix to be BID (first dose starting this evening, no dose this AM) but I would favor keeping at once daily given steady progress (i.e. -3.2L yesterday alone). Will review with cardiologist.  2. AKI on CKD stage IV  - creatinine trending downward with diuresis.  3. Essential HTN - controlled.  SignedMelina Copa PA-C Pager: [phone]   Attending Note Patient seen and discussed with PA [name] [name], I agree with her documentation. Negative 2.3 liters yesterday, negative 12.3 liters since admission. Renal function remains stable and within his baseline. He remains on IV lasix and daily oral metolazone. I would continue lasix IV 80mg  just once daily as he is achieving his daily goal of net negative 2-3 liters per day, more aggressive diuresis would increase his risk for AKI especially given his history of CKD. Continue IV diuretics today, potentially change to oral tomorrow. Would consider outpatient repeat echo once his is diuresed to reevaluate his pulmonary pressures which were severely elevated by echo this admission. Likely on discharge (potentially tomorrow) would change to torsemide 40mg  bid and continue his daily metolazone 5mg , would need f/u in a week along with BMET and Mg.   [name] Abts MD

## 2015-10-31 NOTE — Care Management Important Message (Signed)
Important Message  Patient Details  Name: [name] [name] MRN: [id number] Date of Birth: 1946/03/08   Medicare Important Message Given:  Yes    Joylene Draft, RN 10/31/2015, 12:12 PM

## 2015-10-31 NOTE — Care Management Note (Signed)
Case Management Note  Patient Details  Name: VENCE BAHN MRN: [id number] Date of Birth: 05-19-1946  Subjective/Objective:                    Action/Plan:   Expected Discharge Date:                  Expected Discharge Plan:  Home/Self Care  In-House Referral:  NA  Discharge planning Services  CM Consult  Post Acute Care Choice:  NA Choice offered to:  NA  DME Arranged:    DME Agency:     HH Arranged:    HH Agency:     Status of Service:  Completed, signed off  Medicare Important Message Given:  Yes Date Medicare IM Given:    Medicare IM give by:    Date Additional Medicare IM Given:    Additional Medicare Important Message give by:     If discussed at [place] of Stay Meetings, dates discussed:    Additional Comments: Anticipate discharge within 24-48 hours. Pt adamantly refuses any home health services at this time. Pt does not qualify for home O2 at this time. Christinia Gully [place], RN 10/31/2015, 12:12 PM

## 2015-10-31 NOTE — Progress Notes (Signed)
Patient's oxygen saturation room air at rest was 95 percent. While ambulating 94 percent. No c/o pain or discomfort noted. Tolerated ambulation well.

## 2015-10-31 NOTE — Progress Notes (Signed)
Subjective: Patient feels better. His breathing is improving. His legs are still swollen. [name] diuresing well.  Objective: Vital signs in last 24 hours: Temp:  [97.8 F (36.6 C)-98.4 F (36.9 C)] 98.2 F (36.8 C) (11/23 0521) Pulse Rate:  [73-88] 88 (11/23 0521) Resp:  [20] 20 (11/23 0521) BP: (111-127)/(41-58) 118/58 mmHg (11/23 0521) SpO2:  [92 %-100 %] 100 % (11/23 0752) Weight:  [115.531 kg (254 lb 11.2 oz)] 115.531 kg (254 lb 11.2 oz) (11/23 0521) Weight change: -0.181 kg (-6.4 oz) Last BM Date: 10/27/15  Intake/Output from previous day: 11/22 0701 - 11/23 0700 In: 963 [P.O.:960; I.V.:3] Out: 3275 [Urine:3275]  PHYSICAL EXAM General appearance: alert and moderate distress Resp: diminished breath sounds bilaterally and rhonchi bilaterally Cardio: S1, S2 normal GI: soft, non-tender; bowel sounds normal; no masses,  no organomegaly Extremities: edema 2++ leg edema  Lab Results:  Results for orders placed or performed during the hospital encounter of 10/26/15 (from the past 48 hour(s))  Basic metabolic panel     Status: Abnormal   Collection Time: 10/30/15  6:30 AM  Result Value Ref Range   Sodium 135 135 - 145 mmol/L   Potassium 4.2 3.5 - 5.1 mmol/L   Chloride 97 (L) 101 - 111 mmol/L   CO2 29 22 - 32 mmol/L   Glucose, Bld 93 65 - 99 mg/dL   BUN 71 (H) 6 - 20 mg/dL   Creatinine, Ser 2.50 (H) 0.61 - 1.24 mg/dL   Calcium 8.9 8.9 - 10.3 mg/dL   GFR calc non Af Amer 25 (L) >60 mL/min   GFR calc Af Amer 28 (L) >60 mL/min    Comment: (NOTE) The eGFR has been calculated using the CKD EPI equation. This calculation has not been validated in all clinical situations. eGFR's persistently <60 mL/min signify possible Chronic Kidney Disease.    Anion gap 9 5 - 15  Basic metabolic panel     Status: Abnormal   Collection Time: 10/31/15  6:32 AM  Result Value Ref Range   Sodium 135 135 - 145 mmol/L   Potassium 4.1 3.5 - 5.1 mmol/L   Chloride 98 (L) 101 - 111 mmol/L   CO2 28  22 - 32 mmol/L   Glucose, Bld 83 65 - 99 mg/dL   BUN 70 (H) 6 - 20 mg/dL   Creatinine, Ser 2.47 (H) 0.61 - 1.24 mg/dL   Calcium 8.9 8.9 - 10.3 mg/dL   GFR calc non Af Amer 25 (L) >60 mL/min   GFR calc Af Amer 29 (L) >60 mL/min    Comment: (NOTE) The eGFR has been calculated using the CKD EPI equation. This calculation has not been validated in all clinical situations. eGFR's persistently <60 mL/min signify possible Chronic Kidney Disease.    Anion gap 9 5 - 15    ABGS No results for input(s): PHART, PO2ART, TCO2, HCO3 in the last 72 hours.  Invalid input(s): PCO2 CULTURES Recent Results (from the past 240 hour(s))  MRSA PCR Screening     Status: None   Collection Time: 10/27/15  6:10 PM  Result Value Ref Range Status   MRSA by PCR NEGATIVE NEGATIVE Final    Comment:        The GeneXpert MRSA Assay (FDA approved for NASAL specimens only), is one component of a comprehensive MRSA colonization surveillance program. It is not intended to diagnose MRSA infection nor to guide or monitor treatment for MRSA infections.    Studies/Results: No results found.  Medications:  I have reviewed the patient's current medications.  Assesment:   Active Problems:   Panhypopituitarism (HCC)   Moderate aortic regurgitation   Diabetes (HCC)   CKD (chronic kidney disease), stage III   Acute on chronic combined systolic and diastolic CHF (congestive heart failure) (HCC)   Aortic insufficiency   Essential hypertension   Morbid obesity due to excess calories (HCC)   Congestive heart failure (CHF) ([place])    Plan:  Medications reviewed Continue IV lasix Will monitor BMP Continue current treatment    LOS: 5 days   Fate Caster 10/31/2015, 8:06 AM

## 2015-11-01 LAB — BASIC METABOLIC PANEL
ANION GAP: 10 (ref 5–15)
BUN: 71 mg/dL — ABNORMAL HIGH (ref 6–20)
CHLORIDE: 94 mmol/L — AB (ref 101–111)
CO2: 28 mmol/L (ref 22–32)
Calcium: 8.9 mg/dL (ref 8.9–10.3)
Creatinine, Ser: 2.46 mg/dL — ABNORMAL HIGH (ref 0.61–1.24)
GFR calc Af Amer: 29 mL/min — ABNORMAL LOW (ref >60)
GFR, EST NON AFRICAN AMERICAN: 25 mL/min — AB (ref >60)
Glucose, Bld: 97 mg/dL (ref 65–99)
POTASSIUM: 4.3 mmol/L (ref 3.5–5.1)
SODIUM: 132 mmol/L — AB (ref 135–145)

## 2015-11-01 NOTE — Discharge Summary (Signed)
Physician Discharge Summary  Patient ID: [name] [name] MRN: [id number] DOB/AGE: [birth year] 71 y.o. Primary Care Physician:Alicia Ackert, MD Admit date: 10/26/2015 Discharge date: 11/01/2015    Discharge Diagnoses:     Active Problems:   Panhypopituitarism (HCC)   Moderate aortic regurgitation   Diabetes (HCC)   CKD (chronic kidney disease), stage III   Acute on chronic combined systolic and diastolic CHF (congestive heart failure) (HCC)   Aortic insufficiency   Essential hypertension   Morbid obesity due to excess calories (HCC)   Congestive heart failure (CHF) ([place])     Medication List    STOP taking these medications        azithromycin 250 MG tablet  Commonly known as:  ZITHROMAX     methylPREDNISolone 4 MG Tbpk tablet  Commonly known as:  MEDROL DOSEPAK      TAKE these medications        albuterol (2.5 MG/3ML) 0.083% nebulizer solution  Commonly known as:  PROVENTIL  Take 3 mLs (2.5 mg total) by nebulization every 4 (four) hours as needed for wheezing or shortness of breath.     amLODipine 5 MG tablet  Commonly known as:  NORVASC  TAKE ONE TABLET BY MOUTH DAILY.     budesonide-formoterol 160-4.5 MCG/ACT inhaler  Commonly known as:  SYMBICORT  Inhale 2 puffs into the lungs 2 (two) times daily.     calcitRIOL 0.25 MCG capsule  Commonly known as:  ROCALTROL  Take 0.25 mcg by mouth daily.     cloNIDine 0.2 MG tablet  Commonly known as:  CATAPRES  TAKE (1) TABLET BY MOUTH (3) TIMES DAILY     desmopressin 0.2 MG tablet  Commonly known as:  DDAVP  Take 1 tablet (0.2 mg total) by mouth 2 (two) times daily.     dexamethasone 0.5 MG tablet  Commonly known as:  DECADRON  Take 1 tablet (0.5 mg total) by mouth daily.     fluticasone 50 MCG/ACT nasal spray  Commonly known as:  FLONASE  Place 2 sprays into both nostrils daily.     Fluticasone-Salmeterol 250-50 MCG/DOSE Aepb  Commonly known as:  ADVAIR  Inhale 1 puff into the lungs 2 (two) times daily.      furosemide 40 MG tablet  Commonly known as:  LASIX  Take 40 mg by mouth 2 (two) times daily.     levothyroxine 175 MCG tablet  Commonly known as:  SYNTHROID, LEVOTHROID  Take 1 tablet (175 mcg total) by mouth daily before breakfast.     metolazone 5 MG tablet  Commonly known as:  ZAROXOLYN  Take 5 mg by mouth daily.     pantoprazole 40 MG tablet  Commonly known as:  PROTONIX  Take 1 tablet (40 mg total) by mouth 2 (two) times daily before a meal.     phenytoin 100 MG ER capsule  Commonly known as:  DILANTIN  Take 200-300 mg by mouth daily. Takes 200 mg on Monday and Thursdsay. On all other days of the week takes 300 mg.     polyethylene glycol powder powder  Commonly known as:  GLYCOLAX/MIRALAX  Take 17 g by mouth daily. May decrease to 17 g three times a week if needed.     potassium chloride 20 MEQ packet  Commonly known as:  KLOR-CON  Take 20 mEq by mouth 2 (two) times daily.     simvastatin 20 MG tablet  Commonly known as:  ZOCOR  Take 20 mg by mouth daily.  tiotropium 18 MCG inhalation capsule  Commonly known as:  SPIRIVA  Place 18 mcg into inhaler and inhale daily.     Vitamin D (Ergocalciferol) 50000 UNITS Caps capsule  Commonly known as:  DRISDOL  Take 1 capsule (50,000 Units total) by mouth every 7 (seven) days. Monday        Discharged Condition: improved    Consults: cardiology  Significant Diagnostic Studies: Dg Chest Portable 1 View  10/26/2015  CLINICAL DATA:  Patient with bilateral lower extremity swelling and chest pain. Shortness of breath for 1 week. EXAM: PORTABLE CHEST 1 VIEWm COMPARISON:  Chest radiograph 09/04/2015 FINDINGS: Monitoring leads overlie the patient. Marked cardiomegaly. Interval development of bilateral mid and lower lung airspace opacities, right greater left. Possible small bilateral pleural effusions. IMPRESSION: Marked cardiomegaly. Bilateral mid and lower lung airspace opacities, right greater than left. This may represent  pulmonary edema. Infection and/or aspiration are additional considerations. Possible small bilateral pleural effusions. Electronically Signed   By: Lovey Newcomer M.D.   On: 10/26/2015 16:15    Lab Results: Basic Metabolic Panel:  Recent Labs  10/31/15 0632 11/01/15 0611  NA 135 132*  K 4.1 4.3  CL 98* 94*  CO2 28 28  GLUCOSE 83 97  BUN 70* 71*  CREATININE 2.47* 2.46*  CALCIUM 8.9 8.9   Liver Function Tests: No results for input(s): AST, ALT, ALKPHOS, BILITOT, PROT, ALBUMIN in the last 72 hours.   CBC: No results for input(s): WBC, NEUTROABS, HGB, HCT, MCV, PLT in the last 72 hours.  Recent Results (from the past 240 hour(s))  MRSA PCR Screening     Status: None   Collection Time: 10/27/15  6:10 PM  Result Value Ref Range Status   MRSA by PCR NEGATIVE NEGATIVE Final    Comment:        The GeneXpert MRSA Assay (FDA approved for NASAL specimens only), is one component of a comprehensive MRSA colonization surveillance program. It is not intended to diagnose MRSA infection nor to guide or monitor treatment for MRSA infections.      Hospital Course:   This is a 69 years old male with history of multiple medical illnesses was admitted due acute excerebration of COPD and diastolic CHF. Patient was treated with IV steroid, nebulizer treatment and IV diuretics. Cardiology consultt was and assisted in he treatment. Patient diuresed well and his edema subsided. Patient is being discharged home to continue his medications at home.  Discharge Exam: Blood pressure 123/49, pulse 80, temperature 97.6 F (36.4 C), temperature source Oral, resp. rate 20, height 6\' 1"  (1.854 m), weight 115.6 kg (254 lb 13.6 oz), SpO2 98 %.    Disposition:  home      Signed: Tony Granquist   11/01/2015, 9:01 AM

## 2015-11-27 ENCOUNTER — Ambulatory Visit: Payer: Medicare HMO | Admitting: Cardiovascular Disease

## 2015-12-17 ENCOUNTER — Ambulatory Visit: Payer: Medicare HMO | Admitting: Cardiovascular Disease

## 2015-12-17 ENCOUNTER — Other Ambulatory Visit: Payer: Self-pay | Admitting: Cardiovascular Disease

## 2015-12-18 ENCOUNTER — Ambulatory Visit (INDEPENDENT_AMBULATORY_CARE_PROVIDER_SITE_OTHER): Payer: Medicare HMO | Admitting: Cardiovascular Disease

## 2015-12-18 ENCOUNTER — Encounter: Payer: Self-pay | Admitting: Cardiovascular Disease

## 2015-12-18 VITALS — BP 132/58 | HR 92 | Ht 73.0 in | Wt 262.0 lb

## 2015-12-18 DIAGNOSIS — Z87898 Personal history of other specified conditions: Secondary | ICD-10-CM

## 2015-12-18 DIAGNOSIS — I5022 Chronic systolic (congestive) heart failure: Secondary | ICD-10-CM | POA: Diagnosis not present

## 2015-12-18 DIAGNOSIS — I351 Nonrheumatic aortic (valve) insufficiency: Secondary | ICD-10-CM

## 2015-12-18 DIAGNOSIS — Z9289 Personal history of other medical treatment: Secondary | ICD-10-CM

## 2015-12-18 DIAGNOSIS — Z79899 Other long term (current) drug therapy: Secondary | ICD-10-CM

## 2015-12-18 DIAGNOSIS — I10 Essential (primary) hypertension: Secondary | ICD-10-CM

## 2015-12-18 DIAGNOSIS — E785 Hyperlipidemia, unspecified: Secondary | ICD-10-CM

## 2015-12-18 DIAGNOSIS — N184 Chronic kidney disease, stage 4 (severe): Secondary | ICD-10-CM | POA: Diagnosis not present

## 2015-12-18 MED ORDER — TORSEMIDE 20 MG PO TABS: 40.0000 mg | ORAL_TABLET | Freq: Two times a day (BID) | ORAL | Status: DC
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
## 2015-12-18 NOTE — Patient Instructions (Signed)
Your physician recommends that you schedule a follow-up appointment in: 3 months with K lawrence NP    STOP Lasix   START Torsemide 40 mg twice a day    Get lab work in 1 week : BMET,magnesium      Thank you for choosing [facility] !

## 2015-12-25 ENCOUNTER — Emergency Department (HOSPITAL_COMMUNITY): Payer: Medicare HMO

## 2015-12-25 ENCOUNTER — Emergency Department (HOSPITAL_COMMUNITY)
Admission: EM | Admit: 2015-12-25 | Discharge: 2015-12-25 | Disposition: A | Discharge status: Home / Self Care | Payer: Medicare HMO | Source: Home / Self Care | Attending: Emergency Medicine | Admitting: Emergency Medicine

## 2015-12-25 ENCOUNTER — Encounter (HOSPITAL_COMMUNITY): Payer: Self-pay | Admitting: Emergency Medicine

## 2015-12-25 DIAGNOSIS — E230 Hypopituitarism: Secondary | ICD-10-CM | POA: Insufficient documentation

## 2015-12-25 DIAGNOSIS — Z8619 Personal history of other infectious and parasitic diseases: Secondary | ICD-10-CM | POA: Insufficient documentation

## 2015-12-25 DIAGNOSIS — L03116 Cellulitis of left lower limb: Secondary | ICD-10-CM

## 2015-12-25 DIAGNOSIS — Z87891 Personal history of nicotine dependence: Secondary | ICD-10-CM

## 2015-12-25 DIAGNOSIS — E785 Hyperlipidemia, unspecified: Secondary | ICD-10-CM

## 2015-12-25 DIAGNOSIS — N184 Chronic kidney disease, stage 4 (severe): Secondary | ICD-10-CM | POA: Insufficient documentation

## 2015-12-25 DIAGNOSIS — I129 Hypertensive chronic kidney disease with stage 1 through stage 4 chronic kidney disease, or unspecified chronic kidney disease: Secondary | ICD-10-CM | POA: Insufficient documentation

## 2015-12-25 DIAGNOSIS — Z7952 Long term (current) use of systemic steroids: Secondary | ICD-10-CM | POA: Insufficient documentation

## 2015-12-25 DIAGNOSIS — J449 Chronic obstructive pulmonary disease, unspecified: Secondary | ICD-10-CM | POA: Insufficient documentation

## 2015-12-25 DIAGNOSIS — M25461 Effusion, right knee: Secondary | ICD-10-CM | POA: Diagnosis not present

## 2015-12-25 DIAGNOSIS — N289 Disorder of kidney and ureter, unspecified: Secondary | ICD-10-CM

## 2015-12-25 DIAGNOSIS — E119 Type 2 diabetes mellitus without complications: Secondary | ICD-10-CM | POA: Insufficient documentation

## 2015-12-25 DIAGNOSIS — E669 Obesity, unspecified: Secondary | ICD-10-CM | POA: Insufficient documentation

## 2015-12-25 DIAGNOSIS — Z79899 Other long term (current) drug therapy: Secondary | ICD-10-CM

## 2015-12-25 DIAGNOSIS — I5022 Chronic systolic (congestive) heart failure: Secondary | ICD-10-CM

## 2015-12-25 DIAGNOSIS — M25561 Pain in right knee: Secondary | ICD-10-CM | POA: Diagnosis not present

## 2015-12-25 DIAGNOSIS — Z7951 Long term (current) use of inhaled steroids: Secondary | ICD-10-CM | POA: Insufficient documentation

## 2015-12-25 LAB — CBC WITH DIFFERENTIAL/PLATELET
BASOS ABS: 0.1 10*3/uL (ref 0.0–0.1)
BASOS PCT: 1 %
EOS PCT: 5 %
Eosinophils Absolute: 0.5 10*3/uL (ref 0.0–0.7)
HCT: 42.6 % (ref 39.0–52.0)
Hemoglobin: 13.1 g/dL (ref 13.0–17.0)
Lymphocytes Relative: 8 %
Lymphs Abs: 0.9 10*3/uL (ref 0.7–4.0)
MCH: 27.2 pg (ref 26.0–34.0)
MCHC: 30.8 g/dL (ref 30.0–36.0)
MCV: 88.4 fL (ref 78.0–100.0)
MONO ABS: 1.4 10*3/uL — AB (ref 0.1–1.0)
Monocytes Relative: 14 %
Neutro Abs: 7.3 10*3/uL (ref 1.7–7.7)
Neutrophils Relative %: 72 %
PLATELETS: 203 10*3/uL (ref 150–400)
RBC: 4.82 MIL/uL (ref 4.22–5.81)
RDW: 17.4 % — AB (ref 11.5–15.5)
WBC: 10.1 10*3/uL (ref 4.0–10.5)

## 2015-12-25 LAB — BASIC METABOLIC PANEL
Anion gap: 10 (ref 5–15)
BUN: 70 mg/dL — AB (ref 6–20)
CALCIUM: 9 mg/dL (ref 8.9–10.3)
CO2: 30 mmol/L (ref 22–32)
CREATININE: 2.59 mg/dL — AB (ref 0.61–1.24)
Chloride: 97 mmol/L — ABNORMAL LOW (ref 101–111)
GFR calc Af Amer: 27 mL/min — ABNORMAL LOW (ref >60)
GFR, EST NON AFRICAN AMERICAN: 23 mL/min — AB (ref >60)
GLUCOSE: 117 mg/dL — AB (ref 65–99)
Potassium: 3.5 mmol/L (ref 3.5–5.1)
Sodium: 137 mmol/L (ref 135–145)

## 2015-12-25 LAB — SEDIMENTATION RATE: Sed Rate: 29 mm/hr — ABNORMAL HIGH (ref 0–16)

## 2015-12-25 MED ORDER — OXYCODONE-ACETAMINOPHEN 5-325 MG PO TABS
1.0000 | ORAL_TABLET | Freq: Once | ORAL | Status: AC
  Administered 2015-12-25: 1 via ORAL
  Filled 2015-12-25: qty 1

## 2015-12-25 MED ORDER — CEPHALEXIN 500 MG PO CAPS
1000.0000 mg | ORAL_CAPSULE | Freq: Once | ORAL | Status: AC
  Administered 2015-12-25: 1000 mg via ORAL
  Filled 2015-12-25: qty 2

## 2015-12-25 MED ORDER — OXYCODONE-ACETAMINOPHEN 5-325 MG PO TABS: 1.0000 | ORAL_TABLET | ORAL | Status: DC | PRN

## 2015-12-25 MED ORDER — CEPHALEXIN 500 MG PO CAPS: 500.0000 mg | ORAL_CAPSULE | Freq: Three times a day (TID) | ORAL | Status: DC

## 2015-12-25 NOTE — ED Notes (Signed)
Having pain to left leg since yesterday.  Rates pain 10/10.

## 2015-12-25 NOTE — Discharge Instructions (Signed)
Cellulitis Cellulitis is an infection of the skin and the tissue beneath it. The infected area is usually red and tender. Cellulitis occurs most often in the arms and lower legs.  CAUSES  Cellulitis is caused by bacteria that enter the skin through cracks or cuts in the skin. The most common types of bacteria that cause cellulitis are staphylococci and streptococci. SIGNS AND SYMPTOMS   Redness and warmth.  Swelling.  Tenderness or pain.  Fever. DIAGNOSIS  Your health care provider can usually determine what is wrong based on a physical exam. Blood tests may also be done. TREATMENT  Treatment usually involves taking an antibiotic medicine. HOME CARE INSTRUCTIONS   Take your antibiotic medicine as directed by your health care provider. Finish the antibiotic even if you start to feel better.  Keep the infected arm or leg elevated to reduce swelling.  Apply a warm cloth to the affected area up to 4 times per day to relieve pain.  Take medicines only as directed by your health care provider.  Keep all follow-up visits as directed by your health care provider. SEEK MEDICAL CARE IF:   You notice red streaks coming from the infected area.  Your red area gets larger or turns dark in color.  Your bone or joint underneath the infected area becomes painful after the skin has healed.  Your infection returns in the same area or another area.  You notice a swollen bump in the infected area.  You develop new symptoms.  You have a fever. SEEK IMMEDIATE MEDICAL CARE IF:   You feel very sleepy.  You develop vomiting or diarrhea.  You have a general ill feeling (malaise) with muscle aches and pains.   This information is not intended to replace advice given to you by your health care provider. Make sure you discuss any questions you have with your health care provider.   Document Released: 09/03/2005 Document Revised: 08/15/2015 Document Reviewed: 02/09/2012 Elsevier Interactive  Patient Education 2016 [place].  Cephalexin tablets or capsules What is this medicine? CEPHALEXIN (sef a LEX in) is a cephalosporin antibiotic. It is used to treat certain kinds of bacterial infections It will not work for colds, flu, or other viral infections. This medicine may be used for other purposes; ask your health care provider or pharmacist if you have questions. What should I tell my health care provider before I take this medicine? They need to know if you have any of these conditions: -kidney disease -stomach or intestine problems, especially colitis -an unusual or allergic reaction to cephalexin, other cephalosporins, penicillins, other antibiotics, medicines, foods, dyes or preservatives -pregnant or trying to get pregnant -breast-feeding How should I use this medicine? Take this medicine by mouth with a full glass of water. Follow the directions on the prescription label. This medicine can be taken with or without food. Take your medicine at regular intervals. Do not take your medicine more often than directed. Take all of your medicine as directed even if you think you are better. Do not skip doses or stop your medicine early. Talk to your pediatrician regarding the use of this medicine in children. While this drug may be prescribed for selected conditions, precautions do apply. Overdosage: If you think you have taken too much of this medicine contact a poison control center or emergency room at once. NOTE: This medicine is only for you. Do not share this medicine with others. What if I miss a dose? If you miss a dose, take it  as soon as you can. If it is almost time for your next dose, take only that dose. Do not take double or extra doses. There should be at least 4 to 6 hours between doses. What may interact with this medicine? -probenecid -some other antibiotics This list may not describe all possible interactions. Give your health care provider a list of all the  medicines, herbs, non-prescription drugs, or dietary supplements you use. Also tell them if you smoke, drink alcohol, or use illegal drugs. Some items may interact with your medicine. What should I watch for while using this medicine? Tell your doctor or health care professional if your symptoms do not begin to improve in a few days. Do not treat diarrhea with over the counter products. Contact your doctor if you have diarrhea that lasts more than 2 days or if it is severe and watery. If you have diabetes, you may get a false-positive result for sugar in your urine. Check with your doctor or health care professional. What side effects may I notice from receiving this medicine? Side effects that you should report to your doctor or health care professional as soon as possible: -allergic reactions like skin rash, itching or hives, swelling of the face, lips, or tongue -breathing problems -pain or trouble passing urine -redness, blistering, peeling or loosening of the skin, including inside the mouth -severe or watery diarrhea -unusually weak or tired -yellowing of the eyes, skin Side effects that usually do not require medical attention (report to your doctor or health care professional if they continue or are bothersome): -gas or heartburn -genital or anal irritation -headache -joint or muscle pain -nausea, vomiting This list may not describe all possible side effects. Call your doctor for medical advice about side effects. You may report side effects to FDA at 1-800-FDA-1088. Where should I keep my medicine? Keep out of the reach of children. Store at room temperature between 59 and 86 degrees F (15 and 30 degrees C). Throw away any unused medicine after the expiration date. NOTE: This sheet is a summary. It may not cover all possible information. If you have questions about this medicine, talk to your doctor, pharmacist, or health care provider.    2016, Elsevier/Gold Standard. (2008-02-28  17:09:13)  Acetaminophen; Oxycodone tablets What is this medicine? ACETAMINOPHEN; OXYCODONE (a set a MEE noe fen; ox i KOE done) is a pain reliever. It is used to treat moderate to severe pain. This medicine may be used for other purposes; ask your health care provider or pharmacist if you have questions. What should I tell my health care provider before I take this medicine? They need to know if you have any of these conditions: -brain tumor -Crohn's disease, inflammatory bowel disease, or ulcerative colitis -drug abuse or addiction -head injury -heart or circulation problems -if you often drink alcohol -kidney disease or problems going to the bathroom -liver disease -lung disease, asthma, or breathing problems -an unusual or allergic reaction to acetaminophen, oxycodone, other opioid analgesics, other medicines, foods, dyes, or preservatives -pregnant or trying to get pregnant -breast-feeding How should I use this medicine? Take this medicine by mouth with a full glass of water. Follow the directions on the prescription label. You can take it with or without food. If it upsets your stomach, take it with food. Take your medicine at regular intervals. Do not take it more often than directed. Talk to your pediatrician regarding the use of this medicine in children. Special care may be needed. Patients  over 67 years old may have a stronger reaction and need a smaller dose. Overdosage: If you think you have taken too much of this medicine contact a poison control center or emergency room at once. NOTE: This medicine is only for you. Do not share this medicine with others. What if I miss a dose? If you miss a dose, take it as soon as you can. If it is almost time for your next dose, take only that dose. Do not take double or extra doses. What may interact with this medicine? -alcohol -antihistamines -barbiturates like amobarbital, butalbital, butabarbital, methohexital, pentobarbital,  phenobarbital, thiopental, and secobarbital -benztropine -drugs for bladder problems like solifenacin, trospium, oxybutynin, tolterodine, hyoscyamine, and methscopolamine -drugs for breathing problems like ipratropium and tiotropium -drugs for certain stomach or intestine problems like propantheline, homatropine methylbromide, glycopyrrolate, atropine, belladonna, and dicyclomine -general anesthetics like etomidate, ketamine, nitrous oxide, propofol, desflurane, enflurane, halothane, isoflurane, and sevoflurane -medicines for depression, anxiety, or psychotic disturbances -medicines for sleep -muscle relaxants -naltrexone -narcotic medicines (opiates) for pain -phenothiazines like perphenazine, thioridazine, chlorpromazine, mesoridazine, fluphenazine, prochlorperazine, promazine, and trifluoperazine -scopolamine -tramadol -trihexyphenidyl This list may not describe all possible interactions. Give your health care provider a list of all the medicines, herbs, non-prescription drugs, or dietary supplements you use. Also tell them if you smoke, drink alcohol, or use illegal drugs. Some items may interact with your medicine. What should I watch for while using this medicine? Tell your doctor or health care professional if your pain does not go away, if it gets worse, or if you have new or a different type of pain. You may develop tolerance to the medicine. Tolerance means that you will need a higher dose of the medication for pain relief. Tolerance is normal and is expected if you take this medicine for a long time. Do not suddenly stop taking your medicine because you may develop a severe reaction. Your body becomes used to the medicine. This does NOT mean you are addicted. Addiction is a behavior related to getting and using a drug for a non-medical reason. If you have pain, you have a medical reason to take pain medicine. Your doctor will tell you how much medicine to take. If your doctor wants you  to stop the medicine, the dose will be slowly lowered over time to avoid any side effects. You may get drowsy or dizzy. Do not drive, use machinery, or do anything that needs mental alertness until you know how this medicine affects you. Do not stand or sit up quickly, especially if you are an older patient. This reduces the risk of dizzy or fainting spells. Alcohol may interfere with the effect of this medicine. Avoid alcoholic drinks. There are different types of narcotic medicines (opiates) for pain. If you take more than one type at the same time, you may have more side effects. Give your health care provider a list of all medicines you use. Your doctor will tell you how much medicine to take. Do not take more medicine than directed. Call emergency for help if you have problems breathing. The medicine will cause constipation. Try to have a bowel movement at least every 2 to 3 days. If you do not have a bowel movement for 3 days, call your doctor or health care professional. Do not take Tylenol (acetaminophen) or medicines that have acetaminophen with this medicine. Too much acetaminophen can be very dangerous. Many nonprescription medicines contain acetaminophen. Always read the labels carefully to avoid taking more acetaminophen. What side  effects may I notice from receiving this medicine? Side effects that you should report to your doctor or health care professional as soon as possible: -allergic reactions like skin rash, itching or hives, swelling of the face, lips, or tongue -breathing difficulties, wheezing -confusion -light headedness or fainting spells -severe stomach pain -unusually weak or tired -yellowing of the skin or the whites of the eyes Side effects that usually do not require medical attention (report to your doctor or health care professional if they continue or are bothersome): -dizziness -drowsiness -nausea -vomiting This list may not describe all possible side effects.  Call your doctor for medical advice about side effects. You may report side effects to FDA at 1-800-FDA-1088. Where should I keep my medicine? Keep out of the reach of children. This medicine can be abused. Keep your medicine in a safe place to protect it from theft. Do not share this medicine with anyone. Selling or giving away this medicine is dangerous and against the law. This medicine may cause accidental overdose and death if it taken by other adults, children, or pets. Mix any unused medicine with a substance like cat litter or coffee grounds. Then throw the medicine away in a sealed container like a sealed bag or a coffee can with a lid. Do not use the medicine after the expiration date. Store at room temperature between 20 and 25 degrees C (68 and 77 degrees F). NOTE: This sheet is a summary. It may not cover all possible information. If you have questions about this medicine, talk to your doctor, pharmacist, or health care provider.    2016, Elsevier/Gold Standard. (2014-10-25 15:18:46)
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
## 2015-12-28 ENCOUNTER — Encounter (HOSPITAL_COMMUNITY): Payer: Self-pay

## 2015-12-28 ENCOUNTER — Emergency Department (HOSPITAL_COMMUNITY): Payer: Medicare HMO

## 2015-12-28 ENCOUNTER — Inpatient Hospital Stay (HOSPITAL_COMMUNITY)
Admission: EM | Admit: 2015-12-28 | Discharge: 2015-12-31 | DRG: 565 | Disposition: A | Discharge status: Home / Self Care | Payer: Medicare HMO | Attending: Internal Medicine | Admitting: Internal Medicine

## 2015-12-28 DIAGNOSIS — N184 Chronic kidney disease, stage 4 (severe): Secondary | ICD-10-CM | POA: Diagnosis present

## 2015-12-28 DIAGNOSIS — I129 Hypertensive chronic kidney disease with stage 1 through stage 4 chronic kidney disease, or unspecified chronic kidney disease: Secondary | ICD-10-CM | POA: Diagnosis not present

## 2015-12-28 DIAGNOSIS — I130 Hypertensive heart and chronic kidney disease with heart failure and stage 1 through stage 4 chronic kidney disease, or unspecified chronic kidney disease: Secondary | ICD-10-CM | POA: Diagnosis present

## 2015-12-28 DIAGNOSIS — E785 Hyperlipidemia, unspecified: Secondary | ICD-10-CM | POA: Diagnosis present

## 2015-12-28 DIAGNOSIS — I4510 Unspecified right bundle-branch block: Secondary | ICD-10-CM | POA: Diagnosis not present

## 2015-12-28 DIAGNOSIS — E230 Hypopituitarism: Secondary | ICD-10-CM

## 2015-12-28 DIAGNOSIS — Z87891 Personal history of nicotine dependence: Secondary | ICD-10-CM

## 2015-12-28 DIAGNOSIS — N179 Acute kidney failure, unspecified: Secondary | ICD-10-CM | POA: Diagnosis present

## 2015-12-28 DIAGNOSIS — I272 Other secondary pulmonary hypertension: Secondary | ICD-10-CM | POA: Diagnosis not present

## 2015-12-28 DIAGNOSIS — R569 Unspecified convulsions: Secondary | ICD-10-CM | POA: Diagnosis present

## 2015-12-28 DIAGNOSIS — M009 Pyogenic arthritis, unspecified: Secondary | ICD-10-CM | POA: Diagnosis present

## 2015-12-28 DIAGNOSIS — G4730 Sleep apnea, unspecified: Secondary | ICD-10-CM | POA: Diagnosis present

## 2015-12-28 DIAGNOSIS — A419 Sepsis, unspecified organism: Secondary | ICD-10-CM

## 2015-12-28 DIAGNOSIS — J441 Chronic obstructive pulmonary disease with (acute) exacerbation: Secondary | ICD-10-CM | POA: Diagnosis not present

## 2015-12-28 DIAGNOSIS — R7989 Other specified abnormal findings of blood chemistry: Secondary | ICD-10-CM | POA: Diagnosis not present

## 2015-12-28 DIAGNOSIS — E039 Hypothyroidism, unspecified: Secondary | ICD-10-CM | POA: Diagnosis not present

## 2015-12-28 DIAGNOSIS — L03116 Cellulitis of left lower limb: Secondary | ICD-10-CM | POA: Diagnosis present

## 2015-12-28 DIAGNOSIS — N189 Chronic kidney disease, unspecified: Secondary | ICD-10-CM

## 2015-12-28 DIAGNOSIS — J449 Chronic obstructive pulmonary disease, unspecified: Secondary | ICD-10-CM | POA: Diagnosis not present

## 2015-12-28 DIAGNOSIS — M25561 Pain in right knee: Secondary | ICD-10-CM | POA: Diagnosis present

## 2015-12-28 DIAGNOSIS — E119 Type 2 diabetes mellitus without complications: Secondary | ICD-10-CM

## 2015-12-28 DIAGNOSIS — E1122 Type 2 diabetes mellitus with diabetic chronic kidney disease: Secondary | ICD-10-CM

## 2015-12-28 DIAGNOSIS — R079 Chest pain, unspecified: Secondary | ICD-10-CM | POA: Diagnosis not present

## 2015-12-28 DIAGNOSIS — M25461 Effusion, right knee: Principal | ICD-10-CM | POA: Diagnosis present

## 2015-12-28 DIAGNOSIS — L03119 Cellulitis of unspecified part of limb: Secondary | ICD-10-CM

## 2015-12-28 DIAGNOSIS — I5022 Chronic systolic (congestive) heart failure: Secondary | ICD-10-CM | POA: Diagnosis not present

## 2015-12-28 DIAGNOSIS — R778 Other specified abnormalities of plasma proteins: Secondary | ICD-10-CM | POA: Diagnosis present

## 2015-12-28 LAB — CBC WITH DIFFERENTIAL/PLATELET
BASOS PCT: 0 %
Basophils Absolute: 0 10*3/uL (ref 0.0–0.1)
Eosinophils Absolute: 0.6 10*3/uL (ref 0.0–0.7)
Eosinophils Relative: 5 %
HEMATOCRIT: 40 % (ref 39.0–52.0)
Hemoglobin: 13 g/dL (ref 13.0–17.0)
LYMPHS ABS: 0.9 10*3/uL (ref 0.7–4.0)
LYMPHS PCT: 7 %
MCH: 28 pg (ref 26.0–34.0)
MCHC: 32.5 g/dL (ref 30.0–36.0)
MCV: 86 fL (ref 78.0–100.0)
MONO ABS: 1.8 10*3/uL — AB (ref 0.1–1.0)
MONOS PCT: 15 %
NEUTROS ABS: 9.2 10*3/uL — AB (ref 1.7–7.7)
Neutrophils Relative %: 73 %
Platelets: 230 10*3/uL (ref 150–400)
RBC: 4.65 MIL/uL (ref 4.22–5.81)
RDW: 17.2 % — AB (ref 11.5–15.5)
WBC: 12.5 10*3/uL — ABNORMAL HIGH (ref 4.0–10.5)

## 2015-12-28 LAB — GLUCOSE, SYNOVIAL FLUID: GLUCOSE, SYNOVIAL FLUID: 45 mg/dL

## 2015-12-28 LAB — SYNOVIAL CELL COUNT + DIFF, W/ CRYSTALS
Crystals, Fluid: NEGATIVE
EOSINOPHILS-SYNOVIAL: 0 % (ref 0–1)
LYMPHOCYTES-SYNOVIAL FLD: 1 % (ref 0–20)
MONOCYTE-MACROPHAGE-SYNOVIAL FLUID: 0 % — AB (ref 50–90)
Neutrophil, Synovial: 99 % — ABNORMAL HIGH (ref 0–25)
WBC, SYNOVIAL: 1050 /mm3 — AB (ref 0–200)

## 2015-12-28 LAB — COMPREHENSIVE METABOLIC PANEL
ALT: 14 U/L — ABNORMAL LOW (ref 17–63)
ANION GAP: 13 (ref 5–15)
AST: 23 U/L (ref 15–41)
Albumin: 3.7 g/dL (ref 3.5–5.0)
Alkaline Phosphatase: 50 U/L (ref 38–126)
BILIRUBIN TOTAL: 1.4 mg/dL — AB (ref 0.3–1.2)
BUN: 86 mg/dL — ABNORMAL HIGH (ref 6–20)
CO2: 26 mmol/L (ref 22–32)
Calcium: 9.2 mg/dL (ref 8.9–10.3)
Chloride: 92 mmol/L — ABNORMAL LOW (ref 101–111)
Creatinine, Ser: 3.91 mg/dL — ABNORMAL HIGH (ref 0.61–1.24)
GFR, EST AFRICAN AMERICAN: 16 mL/min — AB (ref >60)
GFR, EST NON AFRICAN AMERICAN: 14 mL/min — AB (ref >60)
Glucose, Bld: 98 mg/dL (ref 65–99)
POTASSIUM: 3.9 mmol/L (ref 3.5–5.1)
Sodium: 131 mmol/L — ABNORMAL LOW (ref 135–145)
TOTAL PROTEIN: 8.6 g/dL — AB (ref 6.5–8.1)

## 2015-12-28 LAB — TROPONIN I
TROPONIN I: 0.05 ng/mL — AB (ref <0.031)
Troponin I: 0.05 ng/mL — ABNORMAL HIGH (ref <0.031)
Troponin I: 0.11 ng/mL — ABNORMAL HIGH (ref <0.031)

## 2015-12-28 LAB — PROTEIN, SYNOVIAL FLUID: PROTEIN, SYNOVIAL FLUID: 5.4 g/dL

## 2015-12-28 LAB — C-REACTIVE PROTEIN: CRP: 49.3 mg/dL — ABNORMAL HIGH (ref <1.0)

## 2015-12-28 LAB — LACTIC ACID, PLASMA: LACTIC ACID, VENOUS: 1.7 mmol/L (ref 0.5–2.0)

## 2015-12-28 LAB — GLUCOSE, CAPILLARY: GLUCOSE-CAPILLARY: 89 mg/dL (ref 65–99)

## 2015-12-28 LAB — SEDIMENTATION RATE: SED RATE: 90 mm/h — AB (ref 0–16)

## 2015-12-28 MED ORDER — NITROGLYCERIN 0.4 MG SL SUBL
0.4000 mg | SUBLINGUAL_TABLET | SUBLINGUAL | Status: DC | PRN
  Administered 2015-12-28: 0.4 mg via SUBLINGUAL
  Filled 2015-12-28: qty 1

## 2015-12-28 MED ORDER — TORSEMIDE 20 MG PO TABS
40.0000 mg | ORAL_TABLET | Freq: Two times a day (BID) | ORAL | Status: DC
  Administered 2015-12-29 – 2015-12-31 (×5): 40 mg via ORAL
  Filled 2015-12-28 (×6): qty 2

## 2015-12-28 MED ORDER — TIOTROPIUM BROMIDE MONOHYDRATE 18 MCG IN CAPS
18.0000 ug | ORAL_CAPSULE | Freq: Every day | RESPIRATORY_TRACT | Status: DC
  Administered 2015-12-29 – 2015-12-31 (×3): 18 ug via RESPIRATORY_TRACT
  Filled 2015-12-28: qty 5

## 2015-12-28 MED ORDER — VANCOMYCIN HCL IN DEXTROSE 1-5 GM/200ML-% IV SOLN
1000.0000 mg | Freq: Once | INTRAVENOUS | Status: AC
  Administered 2015-12-28: 1000 mg via INTRAVENOUS
  Filled 2015-12-28: qty 200

## 2015-12-28 MED ORDER — ONDANSETRON HCL 4 MG PO TABS: 4.0000 mg | ORAL_TABLET | Freq: Four times a day (QID) | ORAL | Status: DC | PRN

## 2015-12-28 MED ORDER — FLUTICASONE PROPIONATE 50 MCG/ACT NA SUSP
2.0000 | Freq: Every day | NASAL | Status: DC
  Administered 2015-12-29 – 2015-12-31 (×3): 2 via NASAL
  Filled 2015-12-28: qty 16

## 2015-12-28 MED ORDER — ACETAMINOPHEN 325 MG PO TABS: 650.0000 mg | ORAL_TABLET | Freq: Four times a day (QID) | ORAL | Status: DC | PRN

## 2015-12-28 MED ORDER — DESMOPRESSIN ACETATE 0.2 MG PO TABS
0.2000 mg | ORAL_TABLET | Freq: Two times a day (BID) | ORAL | Status: DC
  Administered 2015-12-28 – 2015-12-31 (×6): 0.2 mg via ORAL
  Filled 2015-12-28 (×8): qty 1

## 2015-12-28 MED ORDER — VANCOMYCIN HCL 10 G IV SOLR
1500.0000 mg | INTRAVENOUS | Status: DC
  Administered 2015-12-30: 1500 mg via INTRAVENOUS
  Filled 2015-12-28: qty 1500

## 2015-12-28 MED ORDER — ASPIRIN 325 MG PO TABS
325.0000 mg | ORAL_TABLET | Freq: Every day | ORAL | Status: DC
  Administered 2015-12-28 – 2015-12-31 (×4): 325 mg via ORAL
  Filled 2015-12-28 (×4): qty 1

## 2015-12-28 MED ORDER — POTASSIUM CHLORIDE 20 MEQ PO PACK
20.0000 meq | PACK | Freq: Two times a day (BID) | ORAL | Status: DC
  Administered 2015-12-28: 20 meq via ORAL
  Filled 2015-12-28 (×3): qty 1

## 2015-12-28 MED ORDER — LEVOTHYROXINE SODIUM 75 MCG PO TABS
175.0000 ug | ORAL_TABLET | Freq: Every day | ORAL | Status: DC
  Administered 2015-12-29 – 2015-12-31 (×3): 175 ug via ORAL
  Filled 2015-12-28 (×3): qty 1

## 2015-12-28 MED ORDER — SODIUM CHLORIDE 0.9 % IV BOLUS (SEPSIS)
1000.0000 mL | Freq: Once | INTRAVENOUS | Status: AC
  Administered 2015-12-28: 1000 mL via INTRAVENOUS

## 2015-12-28 MED ORDER — PHENYTOIN SODIUM EXTENDED 100 MG PO CAPS
300.0000 mg | ORAL_CAPSULE | ORAL | Status: DC
  Administered 2015-12-28 – 2015-12-30 (×3): 300 mg via ORAL
  Filled 2015-12-28 (×3): qty 3

## 2015-12-28 MED ORDER — PHENYTOIN SODIUM EXTENDED 100 MG PO CAPS: 200.0000 mg | ORAL_CAPSULE | ORAL | Status: DC

## 2015-12-28 MED ORDER — CLONIDINE HCL 0.2 MG PO TABS
0.2000 mg | ORAL_TABLET | Freq: Three times a day (TID) | ORAL | Status: DC
  Administered 2015-12-28 – 2015-12-31 (×9): 0.2 mg via ORAL
  Filled 2015-12-28 (×9): qty 1

## 2015-12-28 MED ORDER — ONDANSETRON HCL 4 MG/2ML IJ SOLN: 4.0000 mg | Freq: Four times a day (QID) | INTRAMUSCULAR | Status: DC | PRN

## 2015-12-28 MED ORDER — SODIUM CHLORIDE 0.9 % IV SOLN
INTRAVENOUS | Status: DC
Start: 2015-12-28 — End: 2015-12-29
  Administered 2015-12-28 – 2015-12-29 (×2): via INTRAVENOUS

## 2015-12-28 MED ORDER — PANTOPRAZOLE SODIUM 40 MG PO TBEC
40.0000 mg | DELAYED_RELEASE_TABLET | Freq: Two times a day (BID) | ORAL | Status: DC
  Administered 2015-12-28 – 2015-12-31 (×6): 40 mg via ORAL
  Filled 2015-12-28 (×6): qty 1

## 2015-12-28 MED ORDER — PHENYTOIN SODIUM EXTENDED 100 MG PO CAPS: 300.0000 mg | ORAL_CAPSULE | ORAL | Status: DC

## 2015-12-28 MED ORDER — SIMVASTATIN 20 MG PO TABS
20.0000 mg | ORAL_TABLET | Freq: Every day | ORAL | Status: DC
  Administered 2015-12-28 – 2015-12-30 (×3): 20 mg via ORAL
  Filled 2015-12-28 (×3): qty 1

## 2015-12-28 MED ORDER — DEXTROSE 5 % IV SOLN
2.0000 g | INTRAVENOUS | Status: DC
  Administered 2015-12-28 – 2015-12-30 (×3): 2 g via INTRAVENOUS
  Filled 2015-12-28 (×4): qty 2

## 2015-12-28 MED ORDER — SODIUM CHLORIDE 0.9 % IJ SOLN
3.0000 mL | Freq: Two times a day (BID) | INTRAMUSCULAR | Status: DC
  Administered 2015-12-28 – 2015-12-31 (×5): 3 mL via INTRAVENOUS

## 2015-12-28 MED ORDER — VANCOMYCIN HCL IN DEXTROSE 750-5 MG/150ML-% IV SOLN
750.0000 mg | Freq: Once | INTRAVENOUS | Status: AC
  Administered 2015-12-28: 750 mg via INTRAVENOUS
  Filled 2015-12-28: qty 150

## 2015-12-28 MED ORDER — DOCUSATE SODIUM 100 MG PO CAPS
100.0000 mg | ORAL_CAPSULE | Freq: Two times a day (BID) | ORAL | Status: DC
  Administered 2015-12-28 – 2015-12-31 (×6): 100 mg via ORAL
  Filled 2015-12-28 (×7): qty 1

## 2015-12-28 MED ORDER — METOLAZONE 5 MG PO TABS
5.0000 mg | ORAL_TABLET | Freq: Every day | ORAL | Status: DC
  Administered 2015-12-29: 5 mg via ORAL
  Filled 2015-12-28: qty 1

## 2015-12-28 MED ORDER — INSULIN ASPART 100 UNIT/ML ~~LOC~~ SOLN: 0.0000 [IU] | Freq: Every day | SUBCUTANEOUS | Status: DC

## 2015-12-28 MED ORDER — INSULIN ASPART 100 UNIT/ML ~~LOC~~ SOLN
0.0000 [IU] | Freq: Three times a day (TID) | SUBCUTANEOUS | Status: DC
  Administered 2015-12-29: 7 [IU] via SUBCUTANEOUS
  Administered 2015-12-29 – 2015-12-30 (×3): 3 [IU] via SUBCUTANEOUS
  Administered 2015-12-30: 4 [IU] via SUBCUTANEOUS

## 2015-12-28 MED ORDER — HYDROCORTISONE NA SUCCINATE PF 100 MG IJ SOLR
100.0000 mg | Freq: Three times a day (TID) | INTRAMUSCULAR | Status: DC
  Administered 2015-12-28 – 2015-12-30 (×6): 100 mg via INTRAVENOUS
  Filled 2015-12-28 (×6): qty 2

## 2015-12-28 MED ORDER — ACETAMINOPHEN 650 MG RE SUPP: 650.0000 mg | Freq: Four times a day (QID) | RECTAL | Status: DC | PRN

## 2015-12-28 MED ORDER — OXYCODONE HCL 5 MG PO TABS: 10.0000 mg | ORAL_TABLET | ORAL | Status: DC | PRN

## 2015-12-28 MED ORDER — LIDOCAINE HCL (PF) 1 % IJ SOLN
INTRAMUSCULAR | Status: AC
  Administered 2015-12-28: 12:00:00
  Filled 2015-12-28: qty 5

## 2015-12-28 MED ORDER — ALBUTEROL SULFATE (2.5 MG/3ML) 0.083% IN NEBU
2.5000 mg | INHALATION_SOLUTION | RESPIRATORY_TRACT | Status: DC
  Administered 2015-12-28 – 2015-12-29 (×5): 2.5 mg via RESPIRATORY_TRACT
  Filled 2015-12-28 (×4): qty 3

## 2015-12-28 MED ORDER — AMLODIPINE BESYLATE 5 MG PO TABS
5.0000 mg | ORAL_TABLET | Freq: Every day | ORAL | Status: DC
  Administered 2015-12-28 – 2015-12-31 (×4): 5 mg via ORAL
  Filled 2015-12-28 (×4): qty 1

## 2015-12-28 MED ORDER — ALUM & MAG HYDROXIDE-SIMETH 200-200-20 MG/5ML PO SUSP: 30.0000 mL | Freq: Four times a day (QID) | ORAL | Status: DC | PRN

## 2015-12-28 MED ORDER — CALCITRIOL 0.25 MCG PO CAPS
0.2500 ug | ORAL_CAPSULE | Freq: Every day | ORAL | Status: DC
  Administered 2015-12-29 – 2015-12-31 (×3): 0.25 ug via ORAL
  Filled 2015-12-28 (×3): qty 1

## 2015-12-28 MED ORDER — POLYETHYLENE GLYCOL 3350 17 G PO PACK: 17.0000 g | PACK | Freq: Every day | ORAL | Status: DC | PRN
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
## 2015-12-28 NOTE — Progress Notes (Signed)
ANTIBIOTIC CONSULT NOTE - INITIAL  Pharmacy Consult for Vancomycin Indication: cellulits  No Known Allergies  Patient Measurements: Height: 6\' 1"  (185.4 cm) Weight: 245 lb 14.4 oz (111.54 kg) IBW/kg (Calculated) : 79.9 Adjusted Body Weight:   Vital Signs: Temp: 98.2 F (36.8 C) (01/20 1917) Temp Source: Oral (01/20 1917) BP: 124/47 mmHg (01/20 1917) Pulse Rate: 97 (01/20 1917) Intake/Output from previous day:   Intake/Output from this shift: Total I/O In: -  Out: 300 [Urine:300]  Labs:  Recent Labs  12/28/15 1220  WBC 12.5*  HGB 13.0  PLT 230  CREATININE 3.91*   Estimated Creatinine Clearance: 22.7 mL/min (by C-G formula based on Cr of 3.91). No results for input(s): VANCOTROUGH, VANCOPEAK, VANCORANDOM, GENTTROUGH, GENTPEAK, GENTRANDOM, TOBRATROUGH, TOBRAPEAK, TOBRARND, AMIKACINPEAK, AMIKACINTROU, AMIKACIN in the last 72 hours.   Microbiology: Recent Results (from the past 720 hour(s))  Culture, body fluid-bottle     Status: None (Preliminary result)   Collection Time: 12/28/15 11:52 AM  Result Value Ref Range Status   Specimen Description SYNOVIAL RIGHT KNEE  Final   Special Requests BOTTLES DRAWN AEROBIC AND ANAEROBIC 5CC EACH  Final   Gram Stain   Final    WBC PRESENT, PREDOMINANTLY PMN NO ORGANISMS SEEN    Culture PENDING  Incomplete   Report Status PENDING  Incomplete    Medical History: Past Medical History  Diagnosis Date  . Chronic obstructive pulmonary disease (HCC)     Chronic bronchitis;  home oxygen; multiple exacerbations  . Hypertension   . Cellulitis of lower leg   . Sleep apnea     Severe on a sleep study in 12/2010  . Hypothyroidism     10/2002: TSH-0.43, T4-0.77  . Tobacco abuse, in remission     20 pack years; discontinued 1998  . Hyperlipidemia     No lipid profile available  . 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  Obesity 10/28/2012  . Seizure disorder ([place])     Onset after craniotomy  . Aortic insufficiency 10/2012    a. Mod-severe, not an operable candidate.  . Chronic systolic CHF (congestive heart failure) ([place])     a.  Last echo 11/2014: EF 40-45%, diffuse HK, mild LVH, elevated LVEDP, mod-severe AI with severely thickened leaflets, dilated sinus of valsalva 4.5cm, visualized portion of prox ascending aorta 4.7cm, mild MR, mildly dilated LA/RV/RA, PASP 60. LV dysfunction felt due to AI. Cath 12/2014 with minimal CAD - 20% LM otherwise minimal luminal irregularities.  . Diabetes mellitus, type 2 ([place])   . GI bleed     a. upper GIB in 03/2015 wit thrombocytopenia and ABL anemia. b. Colonoscopy with pooling of dark burgundy blood noted throughout colon but without obvious bleeding lesion. EGD 03/07/2015 showed probable candida esophagitis, mild gastritis, source for GI bleed not seen. c. Capsule study showed active bleeding at 2 hours into the SB.  . CKD (chronic kidney disease), stage IV (HCC)     S/P right nephrectomy for hypernephroma in 2010  . Chronic respiratory failure ([place])   . Pulmonary hypertension ([place])   . Candida esophagitis ([place])     a. Probable by EGD 03/2015.  Marland Kitchen RBBB     Medications:  Prescriptions prior to admission  Medication Sig Dispense Refill Last Dose  . albuterol (PROVENTIL) (2.5 MG/3ML) 0.083% nebulizer solution Take 3 mLs (2.5 mg total) by nebulization every 4 (four) hours as needed for wheezing  or shortness of breath. 75 mL 12 12/28/2015 at Unknown time  . amLODipine (NORVASC) 5 MG tablet TAKE ONE TABLET BY MOUTH DAILY. 30 tablet 6 12/27/2015 at Unknown time  . budesonide-formoterol (SYMBICORT) 160-4.5 MCG/ACT inhaler Inhale 2 puffs into the lungs 2 (two) times daily.   12/27/2015 at Unknown time  . calcitRIOL (ROCALTROL) 0.25 MCG capsule Take 0.25 mcg by mouth daily.    12/27/2015 at Unknown time  . cephALEXin (KEFLEX) 500 MG capsule Take 1 capsule (500 mg total) by mouth 3  (three) times daily. 30 capsule 0 12/27/2015 at Unknown time  . cloNIDine (CATAPRES) 0.2 MG tablet TAKE (1) TABLET BY MOUTH (3) TIMES DAILY 90 tablet 0 12/27/2015 at Unknown time  . desmopressin (DDAVP) 0.2 MG tablet Take 1 tablet (0.2 mg total) by mouth 2 (two) times daily. 60 tablet 6 12/27/2015 at Unknown time  . dexamethasone (DECADRON) 0.5 MG tablet Take 1 tablet (0.5 mg total) by mouth daily. 30 tablet 6 12/27/2015 at Unknown time  . fluticasone (FLONASE) 50 MCG/ACT nasal spray Place 2 sprays into both nostrils daily.    12/27/2015 at Unknown time  . Fluticasone-Salmeterol (ADVAIR) 250-50 MCG/DOSE AEPB Inhale 1 puff into the lungs 2 (two) times daily.   12/27/2015 at Unknown time  . levothyroxine (SYNTHROID, LEVOTHROID) 175 MCG tablet Take 1 tablet (175 mcg total) by mouth daily before breakfast. 30 tablet 6 12/27/2015 at Unknown time  . metolazone (ZAROXOLYN) 5 MG tablet Take 5 mg by mouth daily.    12/27/2015 at Unknown time  . oxyCODONE-acetaminophen (PERCOCET) 5-325 MG tablet Take 1 tablet by mouth every 4 (four) hours as needed for moderate pain. 20 tablet 0 12/27/2015 at Unknown time  . pantoprazole (PROTONIX) 40 MG tablet Take 1 tablet (40 mg total) by mouth 2 (two) times daily before a meal. (Patient taking differently: Take 40 mg by mouth 2 (two) times daily. ) 60 tablet 3 12/27/2015 at Unknown time  . phenytoin (DILANTIN) 100 MG ER capsule Take 200-300 mg by mouth daily. Takes 200 mg on Monday and Thursdsay. On all other days of the week takes 300 mg.   12/27/2015 at 2100  . polyethylene glycol powder (GLYCOLAX/MIRALAX) powder Take 17 g by mouth daily. May decrease to 17 g three times a week if needed. (Patient taking differently: Take 17 g by mouth daily. May decrease to 17 g three times a week if needed.) 255 g 3 unknown  . potassium chloride (KLOR-CON) 20 MEQ packet Take 20 mEq by mouth 2 (two) times daily.   12/27/2015 at Unknown time  . simvastatin (ZOCOR) 20 MG tablet Take 20 mg by mouth  daily.    12/27/2015 at Unknown time  . tiotropium (SPIRIVA) 18 MCG inhalation capsule Place 18 mcg into inhaler and inhale daily.   12/27/2015 at Unknown time  . torsemide (DEMADEX) 20 MG tablet Take 2 tablets (40 mg total) by mouth 2 (two) times daily. 120 tablet 6 12/27/2015 at Unknown time  . Vitamin D, Ergocalciferol, (DRISDOL) 50000 UNITS CAPS capsule Take 1 capsule (50,000 Units total) by mouth every 7 (seven) days. Monday 12 capsule 0 12/17/2015 at Unknown time   Scheduled:  . albuterol  2.5 mg Nebulization Q4H  . amLODipine  5 mg Oral Daily  . aspirin  325 mg Oral Daily  . [START ON 12/29/2015] calcitRIOL  0.25 mcg Oral Daily  . cefTRIAXone (ROCEPHIN)  IV  2 g Intravenous Q24H  . cloNIDine  0.2 mg Oral TID  . desmopressin  0.2 mg Oral BID  . docusate sodium  100 mg Oral BID  . [START ON 12/29/2015] fluticasone  2 spray Each Nare Daily  . hydrocortisone sod succinate (SOLU-CORTEF) inj  100 mg Intravenous Q8H  . [START ON 12/29/2015] insulin aspart  0-20 Units Subcutaneous TID WC  . insulin aspart  0-5 Units Subcutaneous QHS  . [START ON 12/29/2015] levothyroxine  175 mcg Oral QAC breakfast  . [START ON 12/29/2015] metolazone  5 mg Oral Daily  . pantoprazole  40 mg Oral BID  . phenytoin  200-300 mg Oral Daily  . potassium chloride  20 mEq Oral BID  . simvastatin  20 mg Oral QHS  . sodium chloride  3 mL Intravenous Q12H  . [START ON 12/29/2015] tiotropium  18 mcg Inhalation Daily  . [START ON 12/29/2015] torsemide  40 mg Oral BID   Infusions:  . sodium chloride     Assessment: 70yo male presents with R knee pain. Pharmacy is consulted to dose vancomycin for cellulitis. Pt is afebrile, WBC 12.5, sCr 3.91 (baseline ~2.5).  Pt received vancomycin 1g IV once in the ED.  Goal of Therapy:  Vancomycin trough level 10-15 mcg/ml  Plan:  Vancomycin 750mg  IV once to give 1750mg  load followed by 1500mg  q48h Measure antibiotic drug levels at steady state Follow up culture results, renal  function and clinical course May need to adjust dose if renal function improves  Andrey Cota. Diona Foley, PharmD, [place] Clinical Pharmacist Pager [phone] 12/28/2015,8:48 PM

## 2015-12-28 NOTE — ED Notes (Signed)
O2 at 2L/min [place] started per pt wears O2 at home

## 2015-12-28 NOTE — ED Notes (Signed)
Pt reports was here recently for cellulitis in left leg.  Pt says is no  Better and now c/o pain, swelling, and redness in r leg.  Has been taking his antibiotics.
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
## 2015-12-29 ENCOUNTER — Encounter (HOSPITAL_COMMUNITY): Payer: Self-pay

## 2015-12-29 LAB — GLUCOSE, CAPILLARY
GLUCOSE-CAPILLARY: 215 mg/dL — AB (ref 65–99)
GLUCOSE-CAPILLARY: 146 mg/dL — AB (ref 65–99)
Glucose-Capillary: 119 mg/dL — ABNORMAL HIGH (ref 65–99)
Glucose-Capillary: 129 mg/dL — ABNORMAL HIGH (ref 65–99)

## 2015-12-29 LAB — CBC
HCT: 37.3 % — ABNORMAL LOW (ref 39.0–52.0)
HEMOGLOBIN: 12.1 g/dL — AB (ref 13.0–17.0)
MCH: 27.9 pg (ref 26.0–34.0)
MCHC: 32.4 g/dL (ref 30.0–36.0)
MCV: 85.9 fL (ref 78.0–100.0)
PLATELETS: 193 10*3/uL (ref 150–400)
RBC: 4.34 MIL/uL (ref 4.22–5.81)
RDW: 16.9 % — ABNORMAL HIGH (ref 11.5–15.5)
WBC: 11.6 10*3/uL — AB (ref 4.0–10.5)

## 2015-12-29 LAB — BASIC METABOLIC PANEL
Anion gap: 11 (ref 5–15)
BUN: 82 mg/dL — ABNORMAL HIGH (ref 6–20)
CHLORIDE: 98 mmol/L — AB (ref 101–111)
CO2: 24 mmol/L (ref 22–32)
Calcium: 8.5 mg/dL — ABNORMAL LOW (ref 8.9–10.3)
Creatinine, Ser: 3.2 mg/dL — ABNORMAL HIGH (ref 0.61–1.24)
GFR calc Af Amer: 21 mL/min — ABNORMAL LOW (ref >60)
GFR, EST NON AFRICAN AMERICAN: 18 mL/min — AB (ref >60)
Glucose, Bld: 108 mg/dL — ABNORMAL HIGH (ref 65–99)
POTASSIUM: 4.5 mmol/L (ref 3.5–5.1)
SODIUM: 133 mmol/L — AB (ref 135–145)

## 2015-12-29 LAB — SYNOVIAL CELL COUNT + DIFF, W/ CRYSTALS
CRYSTALS FLUID: NONE SEEN
Lymphocytes-Synovial Fld: 1 % (ref 0–20)
Monocyte-Macrophage-Synovial Fluid: 6 % — ABNORMAL LOW (ref 50–90)
Neutrophil, Synovial: 93 % — ABNORMAL HIGH (ref 0–25)
WBC, SYNOVIAL: 23625 /mm3 — AB (ref 0–200)

## 2015-12-29 LAB — TROPONIN I: TROPONIN I: 0.06 ng/mL — AB (ref <0.031)

## 2015-12-29 MED ORDER — POTASSIUM CHLORIDE CRYS ER 20 MEQ PO TBCR
20.0000 meq | EXTENDED_RELEASE_TABLET | Freq: Two times a day (BID) | ORAL | Status: DC
  Administered 2015-12-29 – 2015-12-31 (×5): 20 meq via ORAL
  Filled 2015-12-29 (×5): qty 1

## 2015-12-29 MED ORDER — ALBUTEROL SULFATE (2.5 MG/3ML) 0.083% IN NEBU: 2.5000 mg | INHALATION_SOLUTION | RESPIRATORY_TRACT | Status: DC | PRN

## 2015-12-29 MED ORDER — ALBUTEROL SULFATE (2.5 MG/3ML) 0.083% IN NEBU
INHALATION_SOLUTION | RESPIRATORY_TRACT | Status: AC
  Administered 2015-12-29: 2.5 mg via RESPIRATORY_TRACT
  Filled 2015-12-29: qty 3

## 2015-12-29 NOTE — Progress Notes (Signed)
[facility] PROGRESS NOTE  [name] [name] [id number] DOB: 1946/03/20 DOA: 12/28/2015 PCP: [name], MD Brief History: [name] [name] is a 70 y.o. 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 He also reported some chest pain and was transferred to tele to [facility] for further eval.  Assessment/Plan: 1. ATYPICAL chest pain: - admitted to telemetry. And his troponin is 0.06. EKG junctional rhythm, repeat EKG today.  - last echocardiogram 11/16 showed LVEF OF 45 TO 50 %. No regional wall abnormalities , RV pressure overload. Moderate AV regurgitation. Moderate TV regurgitation and elevated pulm pressure of 84 mmhg.  - repeat echocardiogram today.   2. Cellulitis and right knee effusion; S/p right knee aspiration, pain control and await results.  Currently on broad spectrum antibiotics.    3. Diabetes mellitus: CBG (last 3)   Recent Labs  12/28/15 2145 12/29/15 0735 12/29/15 1154  GLUCAP 89 119* 129*    Resume SSI while inpatient.  hgba1c is pending.    Hypothyroidism:  Resume synthroid.    Pan hypopituitarism: Currently on desmo, synthroid and hydrocortisone .  Taper hydrocortisone,.    Seizures: Resume phenytoin.    COPD: On [place] oxygen at home.  Resume bronchodilators as needed.       Code Status: full code.  Family Communication: none at bedside Disposition Plan: pending PT eval.   Consultants:  Orthopedics.   Procedures:  Right knee joint aspiration on 1/20 by Dr Doran Durand.  Antibiotics:  Rocephin and vancomycin 1/20  HPI/Subjective: Wants to  eat  Objective: Filed Vitals:   12/28/15 1917 12/29/15 0543  BP: 124/47 124/50  Pulse: 97 99  Temp: 98.2 F (36.8 C) 99 F (37.2 C)  Resp: 16 20    Intake/Output Summary (Last 24 hours) at 12/29/15 1218 Last data filed at 12/29/15 [id number]  Gross per 24 hour  Intake      0 ml  Output   1050 ml  Net  -1050 ml   Filed Weights   12/28/15 1051 12/28/15 1917 12/29/15 0543  Weight: 118.842 kg (262 lb) 111.54 kg (245 lb 14.4 oz) 109.544 kg (241 lb 8 oz)    Exam:   General:  Alert comfortable.   Cardiovascular: s1s2  Respiratory: ctab  Abdomen: soft non tender non distended bowel sounds heard.  Musculoskeletal: right knee pain improved, swelling is improved.   Data Reviewed: Basic Metabolic Panel:  Recent Labs Lab 12/25/15 1948 12/28/15 1220 12/29/15 0410  NA 137 131* 133*  K 3.5 3.9 4.5  CL 97* 92* 98*  CO2 30 26 24   GLUCOSE 117* 98 108*  BUN 70* 86* 82*  CREATININE 2.59* 3.91* 3.20*  CALCIUM 9.0 9.2 8.5*   Liver Function Tests:  Recent Labs Lab 12/28/15 1220  AST 23  ALT 14*  ALKPHOS 50  BILITOT 1.4*  PROT 8.6*  ALBUMIN 3.7   No results for input(s): LIPASE, AMYLASE in the last 168 hours. No results for input(s): AMMONIA in the last 168 hours. CBC:  Recent Labs Lab 12/25/15 1948 12/28/15 1220 12/29/15 0410  WBC 10.1 12.5* 11.6*  NEUTROABS 7.3 9.2*  --   HGB 13.1 13.0 12.1*  HCT 42.6 40.0 37.3*  MCV 88.4  86.0 85.9  PLT 203 230 193   Cardiac Enzymes:  Recent Labs Lab 12/28/15 1220 12/28/15 1542 12/28/15 2113 12/28/15 2316  TROPONINI 0.05* 0.05* 0.11* 0.06*   BNP (last 3 results)  Recent Labs  05/08/15 2340 07/13/15 0848 10/26/15 1612  BNP 363.0* 1699.0* 781.0*    ProBNP (last 3 results) No results for input(s): PROBNP in the last 8760 hours.  CBG:  Recent Labs Lab 12/28/15 2145 12/29/15 0735  GLUCAP 89 119*    Recent Results (from the past 240 hour(s))  Culture, body fluid-bottle     Status: None (Preliminary  result)   Collection Time: 12/28/15 11:52 AM  Result Value Ref Range Status   Specimen Description SYNOVIAL RIGHT KNEE  Final   Special Requests BOTTLES DRAWN AEROBIC AND ANAEROBIC 5CC EACH  Final   Gram Stain   Final    WBC PRESENT, PREDOMINANTLY PMN NO ORGANISMS SEEN    Culture NO GROWTH < 24 HOURS  Final   Report Status PENDING  Incomplete  Body fluid culture     Status: None (Preliminary result)   Collection Time: 12/29/15 12:49 AM  Result Value Ref Range Status   Specimen Description FLUID  Final   Special Requests SYNOVIAL  Final   Gram Stain   Final    ABUNDANT WBC PRESENT,BOTH PMN AND MONONUCLEAR NO ORGANISMS SEEN    Culture PENDING  Incomplete   Report Status PENDING  Incomplete     Studies: Dg Chest 2 View  12/28/2015  CLINICAL DATA:  Anterior chest pain. EXAM: CHEST  2 VIEW COMPARISON:  10/26/2015 FINDINGS: The cardiac silhouette is enlarged. Mediastinal contours appear intact. There is no evidence of focal airspace consolidation, pleural effusion or pneumothorax. Osseous structures are without acute abnormality. Soft tissues are grossly normal. IMPRESSION: Enlarged cardiac silhouette, otherwise no active cardiopulmonary disease. Electronically Signed   By: Fidela Salisbury M.D.   On: 12/28/2015 14:56    Scheduled Meds: . albuterol  2.5 mg Nebulization Q4H  . amLODipine  5 mg Oral Daily  . aspirin  325 mg Oral Daily  . calcitRIOL  0.25 mcg Oral Daily  . cefTRIAXone (ROCEPHIN)  IV  2 g Intravenous Q24H  . cloNIDine  0.2 mg Oral TID  . desmopressin  0.2 mg Oral BID  . docusate sodium  100 mg Oral BID  . fluticasone  2 spray Each Nare Daily  . hydrocortisone sod succinate (SOLU-CORTEF) inj  100 mg Intravenous Q8H  . insulin aspart  0-20 Units Subcutaneous TID WC  . insulin aspart  0-5 Units Subcutaneous QHS  . levothyroxine  175 mcg Oral QAC breakfast  . pantoprazole  40 mg Oral BID  . [START ON 12/31/2015] phenytoin  200 mg Oral Once per day on Mon Thu  .  phenytoin  300 mg Oral Once per day on Sun Tue Wed Fri Sat  . potassium chloride SA  20 mEq Oral BID  . simvastatin  20 mg Oral QHS  . sodium chloride  3 mL Intravenous Q12H  . tiotropium  18 mcg Inhalation Daily  . torsemide  40 mg Oral BID  . [START ON 12/30/2015] vancomycin  1,500 mg Intravenous Q48H   Continuous Infusions:   Active Problems:   Panhypopituitarism ([place])   Diabetes ([place])   Septic arthritis ([place])   Elevated troponin   Acute on chronic renal failure ([employer])   COPD with exacerbation ([place])    Time spent: [facility] Pager [phone] . If  7PM-7AM, please contact night-coverage at [email or web address], password [facility] 12/29/2015, 12:18 PM  LOS: 1 day

## 2015-12-29 NOTE — Progress Notes (Addendum)
     Subjective: Acutely swollen painful right knee  Patient reports pain as mild, pain controlled. Patient feels that the pain in the right knee is significantly better.  Reviewed the result of the labs with Dr. Alvan Dame. At this time the knee is not infected.  Will treat the patient for inflammation in the knee.  Objective:   VITALS:   Filed Vitals:   12/28/15 1917 12/29/15 0543  BP: 124/47 124/50  Pulse: 97 99  Temp: 98.2 F (36.8 C) 99 F (37.2 C)  Resp: 16 20    Dorsiflexion/Plantar flexion intact No cellulitis present Compartment soft Mild swelling of the right knee   LABS  Recent Labs  12/28/15 1220 12/29/15 0410  HGB 13.0 12.1*  HCT 40.0 37.3*  WBC 12.5* 11.6*  PLT 230 193     Recent Labs  12/28/15 1220 12/29/15 0410  NA 131* 133*  K 3.9 4.5  BUN 86* 82*  CREATININE 3.91* 3.20*  GLUCOSE 98 108*     Assessment/Plan: Acutely swollen painful right knee   After reviewing the la work obtained from the aspirate the knee is not infected, have discussed with Dr. Alvan Dame. Inflammation of the knee can be treated with prednisone if able with his DM Again if able to use steroids, would suggest a tapered steroid dose pack. Continue to use ice to help with both inflammation and swelling. Discharge home when ready medically     [place]. Mahamed Zalewski   PAC  12/29/2015, 9:37 AM

## 2015-12-30 LAB — BASIC METABOLIC PANEL
ANION GAP: 16 — AB (ref 5–15)
BUN: 99 mg/dL — ABNORMAL HIGH (ref 6–20)
CALCIUM: 8.7 mg/dL — AB (ref 8.9–10.3)
CO2: 24 mmol/L (ref 22–32)
CREATININE: 2.79 mg/dL — AB (ref 0.61–1.24)
Chloride: 96 mmol/L — ABNORMAL LOW (ref 101–111)
GFR, EST AFRICAN AMERICAN: 25 mL/min — AB (ref >60)
GFR, EST NON AFRICAN AMERICAN: 21 mL/min — AB (ref >60)
GLUCOSE: 136 mg/dL — AB (ref 65–99)
Potassium: 3.9 mmol/L (ref 3.5–5.1)
Sodium: 136 mmol/L (ref 135–145)

## 2015-12-30 LAB — GLUCOSE, CAPILLARY
Glucose-Capillary: 147 mg/dL — ABNORMAL HIGH (ref 65–99)
Glucose-Capillary: 154 mg/dL — ABNORMAL HIGH (ref 65–99)
Glucose-Capillary: 135 mg/dL — ABNORMAL HIGH (ref 65–99)
Glucose-Capillary: 134 mg/dL — ABNORMAL HIGH (ref 65–99)

## 2015-12-30 MED ORDER — MOMETASONE FURO-FORMOTEROL FUM 100-5 MCG/ACT IN AERO
2.0000 | INHALATION_SPRAY | Freq: Two times a day (BID) | RESPIRATORY_TRACT | Status: DC
  Administered 2015-12-31: 2 via RESPIRATORY_TRACT
  Filled 2015-12-30: qty 8.8

## 2015-12-30 MED ORDER — HYDROCORTISONE NA SUCCINATE PF 100 MG IJ SOLR
50.0000 mg | Freq: Three times a day (TID) | INTRAMUSCULAR | Status: DC
  Administered 2015-12-30 – 2015-12-31 (×2): 50 mg via INTRAVENOUS
  Filled 2015-12-30 (×2): qty 2

## 2015-12-30 MED ORDER — BUDESONIDE-FORMOTEROL FUMARATE 160-4.5 MCG/ACT IN AERO
2.0000 | INHALATION_SPRAY | Freq: Two times a day (BID) | RESPIRATORY_TRACT | Status: DC
  Administered 2015-12-30 – 2015-12-31 (×3): 2 via RESPIRATORY_TRACT
  Filled 2015-12-30: qty 6

## 2015-12-30 NOTE — Evaluation (Signed)
Physical Therapy Evaluation Patient Details Name: [name] [name] MRN: [id number] DOB: 08/08/1946 Today's Date: 12/30/2015   History of Present Illness  Pt is a 70 y/o M admitted due to Rt knee pain now s/p aspiration.  Pt's PMH includes Lt LE cellulitis, panhypopituitarism, CAD, chronic CHF, aortic insufficiency, seizure disorder, DMII, obesity.  Clinical Impression  Pt admitted with above diagnosis. Pt currently with functional limitations due to the deficits listed below (see PT Problem List). [name] [name] currently requires min guard assist for safety w/ transfers and ambulation.  He will have 24/7 assist/superivision available from his brother at d/c. Pt will benefit from skilled PT to increase their independence and safety with mobility to allow discharge to the venue listed below.      Follow Up Recommendations Home health PT;Supervision for mobility/OOB    Equipment Recommendations  None recommended by PT    Recommendations for Other Services       Precautions / Restrictions Precautions Precautions: Fall Restrictions Weight Bearing Restrictions: No      Mobility  Bed Mobility Overal bed mobility: Modified Independent             General bed mobility comments: Increased time w/ use of bed rails and HOB elevated.  No physical assist or cues needed.  Transfers Overall transfer level: Needs assistance Equipment used: Rolling walker (2 wheeled) Transfers: Sit to/from Stand Sit to Stand: Min guard         General transfer comment: Cues for hand placement to push from bed/chair when standing and reaching back for armrests when sitting.  Pt initially slow to stand  Ambulation/Gait Ambulation/Gait assistance: Min guard Ambulation Distance (Feet): 70 Feet Assistive device: Rolling walker (2 wheeled) Gait Pattern/deviations: Step-through pattern;Decreased stride length;Antalgic;Trunk flexed   Gait velocity interpretation: Below normal speed for age/gender General Gait  Details: Cues for proper management of RW [name] pt push it too far ahead.  Denies pain w/ ambualtion.  Pt limited by generalized fatigue.  Stairs            Wheelchair Mobility    Modified Rankin (Stroke Patients Only)       Balance Overall balance assessment: Needs assistance Sitting-balance support: Bilateral upper extremity supported;Feet supported Sitting balance-Leahy Scale: Fair     Standing balance support: Bilateral upper extremity supported;During functional activity Standing balance-Leahy Scale: Fair Standing balance comment: RW for support w/ dynamic activity                             Pertinent Vitals/Pain Pain Assessment: No/denies pain Pain Intervention(s): Monitored during session;Limited activity within patient's tolerance    Home Living Family/patient expects to be discharged to:: Private residence Living Arrangements: Alone Available Help at Discharge: Family;Available 24 hours/day (brother can stay with him 24/7 at d/c) Type of Home: House Home Access: Stairs to enter Entrance Stairs-Rails: None Entrance Stairs-Number of Steps: 2 Home Layout: One level Home Equipment: Walker - 2 wheels;Cane - single point;Wheelchair - manual;Bedside commode      Prior Function Level of Independence: Independent with assistive device(s)         Comments: Prior to knee pain using cane, for the past week has been using RW due to pain     Hand Dominance   Dominant Hand: Right    Extremity/Trunk Assessment   Upper Extremity Assessment: Overall WFL for tasks assessed           Lower Extremity Assessment: RLE deficits/detail;LLE deficits/detail RLE  Deficits / Details: strength grossly 5/5, painfree w/ MMT; s/p Rt knee aspiration LLE Deficits / Details: strength grossly 5/5, painfree w/ MMT; Lt LE cellulitis     Communication   Communication: HOH  Cognition Arousal/Alertness: Awake/alert Behavior During Therapy: Flat affect Overall  Cognitive Status: No family/caregiver present to determine baseline cognitive functioning Area of Impairment: Orientation Orientation Level: Disoriented to;Time (believes it is 2016)                  General Comments      Exercises        Assessment/Plan    PT Assessment Patient needs continued PT services  PT Diagnosis Difficulty walking;Acute pain   PT Problem List Decreased strength;Decreased balance;Decreased knowledge of use of DME;Decreased safety awareness;Pain  PT Treatment Interventions DME instruction;Gait training;Stair training;Therapeutic exercise;Therapeutic activities;Functional mobility training;Balance training;Neuromuscular re-education;Patient/family education   PT Goals (Current goals can be found in the Care Plan section) Acute Rehab PT Goals Patient Stated Goal: to go home PT Goal Formulation: With patient Time For Goal Achievement: 01/11/16 Potential to Achieve Goals: Good    Frequency Min 3X/week   Barriers to discharge Inaccessible home environment steps to enter home    Co-evaluation               End of Session Equipment Utilized During Treatment: Gait belt Activity Tolerance: Patient limited by fatigue Patient left: in chair;with call bell/phone within reach;with chair alarm set Nurse Communication: Mobility status         Time: 1357-1419 PT Time Calculation (min) (ACUTE ONLY): 22 min   Charges:   PT Evaluation $PT Eval Moderate Complexity: 1 Procedure     PT G Codes:       Joslyn Hy PT, DPT [phone] Pager: [phone] 12/30/2015, 3:15 PM

## 2015-12-30 NOTE — Progress Notes (Signed)
Subjective:     Patient reports pain as 1 on 0-10 scale. Cultures of knee fluid show No Growth so far.[name] afebrile his knee feels better.   Objective: Vital signs in last 24 hours: Temp:  [98.2 F (36.8 C)-98.9 F (37.2 C)] 98.2 F (36.8 C) (01/22 0543) Pulse Rate:  [86-89] 87 (01/22 0543) Resp:  [16-18] 18 (01/22 0543) BP: (118-125)/(51-54) 125/53 mmHg (01/22 0543) SpO2:  [96 %-99 %] 99 % (01/22 0543)  Intake/Output from previous day: 01/21 0701 - 01/22 0700 In: 340 [P.O.:340] Out: 2350 [Urine:2350] Intake/Output this shift: Total I/O In: 240 [P.O.:240] Out: -    Recent Labs  12/28/15 1220 12/29/15 0410  HGB 13.0 12.1*    Recent Labs  12/28/15 1220 12/29/15 0410  WBC 12.5* 11.6*  RBC 4.65 4.34  HCT 40.0 37.3*  PLT 230 193    Recent Labs  12/28/15 1220 12/29/15 0410  NA 131* 133*  K 3.9 4.5  CL 92* 98*  CO2 26 24  BUN 86* 82*  CREATININE 3.91* 3.20*  GLUCOSE 98 108*  CALCIUM 9.2 8.5*   No results for input(s): LABPT, INR in the last 72 hours.  No cellulitis present  Assessment/Plan:     Up with therapy  Kohner Orlick A 12/30/2015, 9:35 AM

## 2015-12-30 NOTE — Progress Notes (Addendum)
[facility] PROGRESS NOTE  [name] [name] [id number] DOB: 08/08/1946 DOA: 12/28/2015 PCP: [name], MD Brief History: [name] [name] is a 70 y.o. 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 He also reported some chest pain and was transferred to tele to [facility] for further eval.  Assessment/Plan: 1. ATYPICAL chest pain: - admitted to telemetry. And his troponin is 0.06. EKG junctional rhythm, repeat EKG today.  - last echocardiogram 11/16 showed LVEF OF 45 TO 50 %. No regional wall abnormalities , RV pressure overload. Moderate AV regurgitation. Moderate TV regurgitation and elevated pulm pressure of 84 mmhg.  - repeat echocardiogram today.  Repeat EKG unchanged from last year.  He absolutely denies any chest pain.   2. Cellulitis and right knee effusion; S/p right knee aspiration, pain control and await culture  results.  Currently on broad spectrum antibiotics.    3. Diabetes mellitus: CBG (last 3)   Recent Labs  12/30/15 0729 12/30/15 1139 12/30/15 1643  GLUCAP 147* 154* 135*    Resume SSI while inpatient.  hgba1c is pending.    Hypothyroidism:  Resume synthroid.    Pan hypopituitarism: Currently on desmo, synthroid and hydrocortisone .  Taper hydrocortisone,.    Seizures: Resume phenytoin.    COPD: On [place] oxygen at home.  Resume bronchodilators as needed.       Code Status: full code.  Family Communication: none at bedside Disposition Plan: home possibly in am, with home health PT.    Consultants:  Orthopedics.   Procedures:  Right knee joint  aspiration on 1/20 by Dr Doran Durand.  ECHOCARDIOGRAM.  Antibiotics:  Rocephin and vancomycin 1/20.    HPI/Subjective: Knee pain is better.  No chest pain.  Asking how his feet is looking.   Objective: Filed Vitals:   12/30/15 0957 12/30/15 1630  BP: 127/55 124/51  Pulse:    Temp:  99 F (37.2 C)  Resp:      Intake/Output Summary (Last 24 hours) at 12/30/15 1920 Last data filed at 12/30/15 1650  Gross per 24 hour  Intake    360 ml  Output   2925 ml  Net  -2565 ml   Filed Weights   12/28/15 1051 12/28/15 1917 12/29/15 0543  Weight: 118.842 kg (262 lb) 111.54 kg (245 lb 14.4 oz) 109.544 kg (241 lb 8 oz)    Exam:   General:  Alert comfortable.   Cardiovascular: s1s2  Respiratory: ctab  Abdomen: soft non tender non distended bowel sounds heard.  Musculoskeletal: right knee pain improved, swelling is improved.   Left leg cellulitis improved.   Data Reviewed: Basic Metabolic Panel:  Recent Labs Lab 12/25/15 1948 12/28/15 1220 12/29/15 0410 12/30/15 1550  NA 137 131* 133* 136  K 3.5 3.9 4.5 3.9  CL 97* 92* 98* 96*  CO2 30 26 24 24   GLUCOSE 117* 98 108* 136*  BUN 70* 86* 82* 99*  CREATININE 2.59* 3.91* 3.20* 2.79*  CALCIUM 9.0 9.2 8.5* 8.7*   Liver Function Tests:  Recent Labs Lab 12/28/15 1220  AST 23  ALT 14*  ALKPHOS 50  BILITOT 1.4*  PROT 8.6*  ALBUMIN 3.7   No results for input(s): LIPASE, AMYLASE in  the last 168 hours. No results for input(s): AMMONIA in the last 168 hours. CBC:  Recent Labs Lab 12/25/15 1948 12/28/15 1220 12/29/15 0410  WBC 10.1 12.5* 11.6*  NEUTROABS 7.3 9.2*  --   HGB 13.1 13.0 12.1*  HCT 42.6 40.0 37.3*  MCV 88.4 86.0 85.9  PLT 203 230 193   Cardiac Enzymes:  Recent Labs Lab 12/28/15 1220 12/28/15 1542 12/28/15 2113 12/28/15 2316  TROPONINI 0.05* 0.05* 0.11* 0.06*   BNP (last 3 results)  Recent Labs  05/08/15 2340 07/13/15 0848 10/26/15 1612  BNP 363.0* 1699.0* 781.0*    ProBNP (last 3  results) No results for input(s): PROBNP in the last 8760 hours.  CBG:  Recent Labs Lab 12/29/15 1632 12/29/15 2129 12/30/15 0729 12/30/15 1139 12/30/15 1643  GLUCAP 215* 146* 147* 154* 135*    Recent Results (from the past 240 hour(s))  Culture, body fluid-bottle     Status: None (Preliminary result)   Collection Time: 12/28/15 11:52 AM  Result Value Ref Range Status   Specimen Description SYNOVIAL RIGHT KNEE  Final   Special Requests BOTTLES DRAWN AEROBIC AND ANAEROBIC 5CC EACH  Final   Gram Stain   Final    WBC PRESENT, PREDOMINANTLY PMN NO ORGANISMS SEEN    Culture NO GROWTH 2 DAYS  Final   Report Status PENDING  Incomplete  Anaerobic culture     Status: None (Preliminary result)   Collection Time: 12/29/15 12:49 AM  Result Value Ref Range Status   Specimen Description FLUID  Final   Special Requests SYNOVIAL  Final   Gram Stain   Final    ABUNDANT WBC PRESENT,BOTH PMN AND MONONUCLEAR NO ORGANISMS SEEN    Culture   Final    NO ANAEROBES ISOLATED; CULTURE IN PROGRESS FOR 5 DAYS   Report Status PENDING  Incomplete  Body fluid culture     Status: None (Preliminary result)   Collection Time: 12/29/15 12:49 AM  Result Value Ref Range Status   Specimen Description FLUID  Final   Special Requests SYNOVIAL  Final   Gram Stain   Final    ABUNDANT WBC PRESENT,BOTH PMN AND MONONUCLEAR NO ORGANISMS SEEN    Culture NO GROWTH 1 DAY  Final   Report Status PENDING  Incomplete     Studies: No results found.  Scheduled Meds: . amLODipine  5 mg Oral Daily  . aspirin  325 mg Oral Daily  . budesonide-formoterol  2 puff Inhalation BID  . calcitRIOL  0.25 mcg Oral Daily  . cefTRIAXone (ROCEPHIN)  IV  2 g Intravenous Q24H  . cloNIDine  0.2 mg Oral TID  . desmopressin  0.2 mg Oral BID  . docusate sodium  100 mg Oral BID  . fluticasone  2 spray Each Nare Daily  . hydrocortisone sod succinate (SOLU-CORTEF) inj  50 mg Intravenous Q8H  . insulin aspart  0-20 Units  Subcutaneous TID WC  . insulin aspart  0-5 Units Subcutaneous QHS  . levothyroxine  175 mcg Oral QAC breakfast  . mometasone-formoterol  2 puff Inhalation BID  . pantoprazole  40 mg Oral BID  . [START ON 12/31/2015] phenytoin  200 mg Oral Once per day on Mon Thu  . phenytoin  300 mg Oral Once per day on Sun Tue Wed Fri Sat  . potassium chloride SA  20 mEq Oral BID  . simvastatin  20 mg Oral QHS  . sodium chloride  3 mL Intravenous Q12H  . tiotropium  18 mcg Inhalation  Daily  . torsemide  40 mg Oral BID  . vancomycin  1,500 mg Intravenous Q48H   Continuous Infusions:   Active Problems:   Panhypopituitarism (HCC)   Diabetes ([place])   Septic arthritis ([place])   Elevated troponin   Acute on chronic renal failure (HCC)   COPD with exacerbation ([place])    Time spent: 20 minutes.     [facility] Pager [phone] . If 7PM-7AM, please contact night-coverage at [email or web address], password [facility] 12/30/2015, 7:20 PM  LOS: 2 days

## 2015-12-31 ENCOUNTER — Inpatient Hospital Stay (HOSPITAL_COMMUNITY): Payer: Medicare HMO

## 2015-12-31 DIAGNOSIS — R079 Chest pain, unspecified: Secondary | ICD-10-CM

## 2015-12-31 LAB — CBC
HCT: 37 % — ABNORMAL LOW (ref 39.0–52.0)
Hemoglobin: 12.1 g/dL — ABNORMAL LOW (ref 13.0–17.0)
MCH: 27.6 pg (ref 26.0–34.0)
MCHC: 32.7 g/dL (ref 30.0–36.0)
MCV: 84.5 fL (ref 78.0–100.0)
PLATELETS: 224 10*3/uL (ref 150–400)
RBC: 4.38 MIL/uL (ref 4.22–5.81)
RDW: 16.6 % — AB (ref 11.5–15.5)
WBC: 10.1 10*3/uL (ref 4.0–10.5)

## 2015-12-31 LAB — GLUCOSE, CAPILLARY
GLUCOSE-CAPILLARY: 96 mg/dL (ref 65–99)
Glucose-Capillary: 109 mg/dL — ABNORMAL HIGH (ref 65–99)

## 2015-12-31 LAB — BASIC METABOLIC PANEL
ANION GAP: 16 — AB (ref 5–15)
BUN: 106 mg/dL — ABNORMAL HIGH (ref 6–20)
CALCIUM: 8.7 mg/dL — AB (ref 8.9–10.3)
CO2: 25 mmol/L (ref 22–32)
CREATININE: 2.83 mg/dL — AB (ref 0.61–1.24)
Chloride: 96 mmol/L — ABNORMAL LOW (ref 101–111)
GFR calc Af Amer: 24 mL/min — ABNORMAL LOW (ref >60)
GFR calc non Af Amer: 21 mL/min — ABNORMAL LOW (ref >60)
GLUCOSE: 125 mg/dL — AB (ref 65–99)
Potassium: 3.2 mmol/L — ABNORMAL LOW (ref 3.5–5.1)
Sodium: 137 mmol/L (ref 135–145)

## 2015-12-31 LAB — MRSA PCR SCREENING: MRSA by PCR: NEGATIVE

## 2015-12-31 LAB — PATHOLOGIST SMEAR REVIEW

## 2015-12-31 MED ORDER — CEPHALEXIN 500 MG PO CAPS: 500.0000 mg | ORAL_CAPSULE | Freq: Two times a day (BID) | ORAL | Status: DC

## 2015-12-31 MED ORDER — POTASSIUM CHLORIDE CRYS ER 20 MEQ PO TBCR
40.0000 meq | EXTENDED_RELEASE_TABLET | Freq: Two times a day (BID) | ORAL | Status: DC
  Administered 2015-12-31: 40 meq via ORAL
  Filled 2015-12-31 (×2): qty 2

## 2015-12-31 MED ORDER — DEXAMETHASONE 0.5 MG PO TABS: 0.5000 mg | ORAL_TABLET | Freq: Every day | ORAL | Status: DC

## 2015-12-31 MED ORDER — ASPIRIN EC 81 MG PO TBEC: 81.0000 mg | DELAYED_RELEASE_TABLET | Freq: Every day | ORAL | Status: DC

## 2015-12-31 MED FILL — Oxycodone w/ Acetaminophen Tab 5-325 MG: ORAL | Qty: 6 | Status: AC

## 2015-12-31 NOTE — Clinical Social Work Note (Cosign Needed)
BSW intern and CSW met with patient and family at bedside. Patient will have 24 hour support at home provided by patient brother. Family is agreeable to plan. BSW intern signing off.   [name] [name] BSW Intern, [phone]

## 2015-12-31 NOTE — Care Management Note (Signed)
Case Management Note  Patient Details  Name: [name] [name] MRN: [id number] Date of Birth: 15-Aug-1946  Subjective/Objective:   Pt admitted for Septic Arthritis. Pt is from home alone. CM did discuss with pt the option of SNF placement. Per pt he will have support of brother for 7 days. Daughter at bedside.                  Action/Plan: CM did provide daughter with list of personal care agencies. CM did stress that this is a out of pocket cost. CM did make referral to [facility] for [facility] Services. Pt is agreeable to services and SOC to begin within 24-48 hrs post d/c. No further needs from CM at this time.    Expected Discharge Date:                  Expected Discharge Plan:  [place]  In-House Referral:  Clinical Social Work  Discharge planning Services  CM Consult  Post Acute Care Choice:  Home Health Choice offered to:  Patient, Adult Children  DME Arranged:  N/A DME Agency:  NA  HH Arranged:  PT [place] Agency:  [place]  Status of Service:  Completed, signed off  Medicare Important Message Given:    Date Medicare IM Given:    Medicare IM give by:    Date Additional Medicare IM Given:    Additional Medicare Important Message give by:     If discussed at [place] of Stay Meetings, dates discussed:    Additional Comments:  Bethena Roys, RN 12/31/2015, 11:08 AM

## 2015-12-31 NOTE — Progress Notes (Signed)
  Echocardiogram 2D Echocardiogram has been performed.  Tresa Res 12/31/2015, 10:59 AM

## 2015-12-31 NOTE — NC FL2 (Signed)
[place] MEDICAID FL2 LEVEL OF CARE SCREENING TOOL     IDENTIFICATION  Patient Name: [name] [name] Birthdate: 02/29/1946 Sex: male Admission Date (Current Location): 12/28/2015  [facility] and [place] Number:  [employer] and Address:  [facility]. [facility], [place] [address], [place], [place] [address]      Provider Number: [id number]  Attending Physician Name and Address:  Hosie Poisson, MD  Relative Name and Phone Number:  Rylei Saah [phone]    Current Level of Care: Hospital Recommended Level of Care: [place] Prior Approval Number:    Date Approved/Denied:   PASRR Number: [id number]  Discharge Plan: SNF    Current Diagnoses: Patient Active Problem List   Diagnosis Date Noted  . Septic arthritis ([place]) 12/28/2015  . Elevated troponin 12/28/2015  . Acute on chronic renal failure ([place]) 12/28/2015  . COPD with exacerbation ([place]) 12/28/2015  . Congestive heart failure (CHF) ([place]) 10/26/2015  . 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 Morbid obesity due to excess calories ([place]) 09/14/2015  . Vitamin D deficiency 09/14/2015  . Thrombocytopenia due to blood loss   . GI bleed 03/03/2015  . Upper GI bleed 03/02/2015  . Seizures ([place]) 02/14/2015  . 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 Essential hypertension 02/14/2015  . Hemorrhoids 01/24/2015  . Constipation 01/24/2015  . Aortic insufficiency   . Sepsis ([place]) 12/06/2014  . Pyrexia   . CKD (chronic kidney disease), stage III 12/04/2014  . Chest pain 06/12/2014  . Seizure disorder ([place]) 03/27/2013  . Diabetes ([place]) 03/27/2013  . Hypertension   . Sleep apnea   . Hypothyroid   . Tobacco abuse, in remission   . Hyperlipidemia   . Panhypopituitarism ([place])   . Obese 10/28/2012  . Moderate aortic regurgitation 10/08/2012    Orientation RESPIRATION BLADDER Height & Weight    Self, Time, Situation,  Place  Normal Continent 6\' 1"  (185.4 cm) 239 lbs.  BEHAVIORAL SYMPTOMS/MOOD NEUROLOGICAL BOWEL NUTRITION STATUS  Other (Comment) (Appropriate)  (n/a) Continent Diet (Carb Modified)  AMBULATORY STATUS COMMUNICATION OF NEEDS Skin   Limited Assist Verbally Normal                       Personal Care Assistance Level of Assistance  Bathing, Feeding, Dressing Bathing Assistance: Limited assistance Feeding assistance: Limited assistance Dressing Assistance: Limited assistance     Functional Limitations Info  Sight, Hearing, Speech Sight Info: Adequate Hearing Info: Adequate Speech Info: Adequate    SPECIAL CARE FACTORS FREQUENCY  PT (By licensed PT), OT (By licensed OT)     PT Frequency: 5x/week OT Frequency: 5x/week            Contractures Contractures Info: Not present    Additional Factors Info  Code Status, Insulin Sliding Scale, Isolation Precautions, Allergies Code Status Info: FULL CODE Allergies Info: NKDA   Insulin Sliding Scale Info: 0-20 units/ 3X day, 0-5 units/ bedtime Isolation Precautions Info: Contact precautions     Current Medications (12/31/2015):  This is the current hospital active medication list Current Facility-Administered Medications  Medication Dose Route Frequency Provider Last Rate Last Dose  . acetaminophen (TYLENOL) tablet 650 mg  650 mg Oral Q6H PRN Truett Mainland, DO       Or  . acetaminophen (TYLENOL) suppository 650 mg  650 mg Rectal Q6H PRN Tanna Savoy Stinson, DO      . albuterol (PROVENTIL) (2.5 MG/3ML) 0.083% nebulizer  solution 2.5 mg  2.5 mg Nebulization Q4H PRN Hosie Poisson, MD      . alum & mag hydroxide-simeth (MAALOX/MYLANTA) 200-200-20 MG/5ML suspension 30 mL  30 mL Oral Q6H PRN Tanna Savoy Stinson, DO      . amLODipine (NORVASC) tablet 5 mg  5 mg Oral Daily Tanna Savoy Stinson, DO   5 mg at 12/31/15 [id number]  . aspirin tablet 325 mg  325 mg Oral Daily Merrily Pew, MD   325 mg at 12/31/15 [id number]  . budesonide-formoterol (SYMBICORT)  160-4.5 MCG/ACT inhaler 2 puff  2 puff Inhalation BID Ritta Slot, NP   2 puff at 12/31/15 0835  . calcitRIOL (ROCALTROL) capsule 0.25 mcg  0.25 mcg Oral Daily Tanna Savoy Stinson, DO   0.25 mcg at 12/31/15 [id number]  . cefTRIAXone (ROCEPHIN) 2 g in dextrose 5 % 50 mL IVPB  2 g Intravenous Q24H Tanna Savoy Stinson, DO 100 mL/hr at 12/30/15 1648 2 g at 12/30/15 1648  . cloNIDine (CATAPRES) tablet 0.2 mg  0.2 mg Oral TID Tanna Savoy Stinson, DO   0.2 mg at 12/31/15 [id number]  . desmopressin (DDAVP) tablet 0.2 mg  0.2 mg Oral BID Tanna Savoy Stinson, DO   0.2 mg at 12/30/15 2107  . docusate sodium (COLACE) capsule 100 mg  100 mg Oral BID Tanna Savoy Stinson, DO   100 mg at 12/31/15 [id number]  . fluticasone (FLONASE) 50 MCG/ACT nasal spray 2 spray  2 spray Each Nare Daily Truett Mainland, DO   2 spray at 12/30/15 0955  . hydrocortisone sodium succinate (SOLU-CORTEF) 100 MG injection 50 mg  50 mg Intravenous Q8H Hosie Poisson, MD   50 mg at 12/31/15 0536  . insulin aspart (novoLOG) injection 0-20 Units  0-20 Units Subcutaneous TID WC Truett Mainland, DO   3 Units at 12/30/15 1648  . insulin aspart (novoLOG) injection 0-5 Units  0-5 Units Subcutaneous QHS Tanna Savoy Stinson, DO   0 Units at 12/28/15 2200  . levothyroxine (SYNTHROID, LEVOTHROID) tablet 175 mcg  175 mcg Oral QAC breakfast Truett Mainland, DO   175 mcg at 12/31/15 [id number]  . mometasone-formoterol (DULERA) 100-5 MCG/ACT inhaler 2 puff  2 puff Inhalation BID Truett Mainland, DO   2 puff at 12/31/15 0836  . nitroGLYCERIN (NITROSTAT) SL tablet 0.4 mg  0.4 mg Sublingual Q5 min PRN Merrily Pew, MD   0.4 mg at 12/28/15 1521  . ondansetron (ZOFRAN) tablet 4 mg  4 mg Oral Q6H PRN Tanna Savoy Stinson, DO       Or  . ondansetron [facility]) injection 4 mg  4 mg Intravenous Q6H PRN Tanna Savoy Stinson, DO      . oxyCODONE (Oxy IR/ROXICODONE) immediate release tablet 10 mg  10 mg Oral Q4H PRN Tanna Savoy Stinson, DO      . pantoprazole (PROTONIX) EC tablet 40 mg  40 mg Oral BID Tanna Savoy Stinson, DO   40 mg at  12/31/15 [id number]  . phenytoin (DILANTIN) ER capsule 200 mg  200 mg Oral Once per day on Mon Thu Jacob J Stinson, DO      . phenytoin (DILANTIN) ER capsule 300 mg  300 mg Oral Once per day on Sun Tue Wed Fri Sat Truett Mainland, DO   300 mg at 12/30/15 2041  . polyethylene glycol (MIRALAX / GLYCOLAX) packet 17 g  17 g Oral Daily PRN Tanna Savoy Stinson, DO      . potassium chloride SA (K-DUR,KLOR-CON) CR tablet 40 mEq  40 mEq Oral BID Hosie Poisson, MD      . simvastatin (ZOCOR) tablet 20 mg  20 mg Oral QHS Tanna Savoy Stinson, DO   20 mg at 12/30/15 2100  . sodium chloride 0.9 % injection 3 mL  3 mL Intravenous Q12H Tanna Savoy Stinson, DO   3 mL at 12/30/15 2100  . tiotropium (SPIRIVA) inhalation capsule 18 mcg  18 mcg Inhalation Daily Tanna Savoy Stinson, DO   18 mcg at 12/31/15 [id number]  . torsemide (DEMADEX) tablet 40 mg  40 mg Oral BID Tanna Savoy Stinson, DO   40 mg at 12/31/15 [id number]  . vancomycin (VANCOCIN) 1,500 mg in sodium chloride 0.9 % 500 mL IVPB  1,500 mg Intravenous Q48H Rebecka Apley, RPH   1,500 mg at 12/30/15 2043     Discharge Medications: Please see discharge summary for a list of discharge medications.  Relevant Imaging Results:  Relevant Lab Results:   Additional [place] Intern, [id number]

## 2015-12-31 NOTE — Plan of Care (Signed)
Problem: Phase II Progression Outcomes Goal: Anginal pain relieved Outcome: Completed/Met Date Met:  12/31/15 Pt remained free from chest pain

## 2015-12-31 NOTE — Care Management Important Message (Signed)
Important Message  Patient Details  Name: [name] [name] MRN: [id number] Date of Birth: 1945-12-28   Medicare Important Message Given:  Yes    Bethena Roys, RN 12/31/2015, 11:12 AM

## 2015-12-31 NOTE — Progress Notes (Signed)
Subjective:      Patient reports pain as mild to moderate.  Tolerating POs well.  Denies BM, but admits to flatulence.  Denies fever, chills, N/V.  Notes that his knee feels better.  No culture growth thus far.  On broad spectrum ABX.  Objective:   VITALS:  Temp:  [98.1 F (36.7 C)-99 F (37.2 C)] 98.2 F (36.8 C) (01/23 0500) Pulse Rate:  [83-88] 88 (01/23 0500) Resp:  [16] 16 (01/23 0500) BP: (124-135)/(45-55) 135/45 mmHg (01/23 0500) SpO2:  [93 %-97 %] 93 % (01/23 0500) Weight:  [108.591 kg (239 lb 6.4 oz)] 108.591 kg (239 lb 6.4 oz) (01/23 0500)  General: WDWN patient in NAD. Psych:  Appropriate mood and affect. Neuro:  A&O x 3, Moving all extremities, sensation intact to light touch HEENT:  EOMs intact Chest:  Even non-labored respirations Skin:  C/D/I, no rashes or lesions Extremities: warm/dry, mild edema, no erythmea or echymosis.  No lymphadenopathy. Pulses: Popliteus 2+ MSK:  ROM: knee 0-120, MMT: patient is able to perform quad set, (-) Homan's    LABS  Recent Labs  12/28/15 1220 12/29/15 0410 12/31/15 0455  HGB 13.0 12.1* 12.1*  WBC 12.5* 11.6* 10.1  PLT 230 193 224    Recent Labs  12/30/15 1550 12/31/15 0455  NA 136 137  K 3.9 3.2*  CL 96* 96*  CO2 24 25  BUN 99* 106*  CREATININE 2.79* 2.83*  GLUCOSE 136* 125*   No results for input(s): LABPT, INR in the last 72 hours.   Assessment/Plan:      Up with therapy  WBAT RLE Ice and elevate for symptomatic relief. Plan to D/C home once stable. ABX TBD by medicine team  Mechele Claude, PA-C, [facility] Office:  [phone]

## 2015-12-31 NOTE — Plan of Care (Signed)
Problem: Discharge Progression Outcomes Goal: No anginal pain Outcome: Completed/Met Date Met:  12/31/15 Pt remains free from chest pain  Goal: Discharge plan in place and appropriate Outcome: Completed/Met Date Met:  12/31/15 Pt being discharged home today  Goal: Tolerates diet Outcome: Completed/Met Date Met:  12/31/15 Pt able to tolerate current diet

## 2016-01-01 LAB — BODY FLUID CULTURE: CULTURE: NO GROWTH

## 2016-01-01 LAB — HEMOGLOBIN A1C
Hgb A1c MFr Bld: 6.3 % — ABNORMAL HIGH (ref 4.8–5.6)
MEAN PLASMA GLUCOSE: 134 mg/dL
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
## 2016-01-02 LAB — CULTURE, BODY FLUID-BOTTLE

## 2016-01-02 LAB — CULTURE, BODY FLUID W GRAM STAIN -BOTTLE: Culture: NO GROWTH

## 2016-01-03 LAB — ANAEROBIC CULTURE

## 2016-01-11 ENCOUNTER — Other Ambulatory Visit: Payer: Self-pay | Admitting: Cardiology

## 2016-03-14 ENCOUNTER — Ambulatory Visit: Payer: Medicare HMO | Admitting: "Endocrinology

## 2016-03-17 ENCOUNTER — Encounter: Payer: Medicare HMO | Admitting: Adult Health

## 2016-03-17 NOTE — Progress Notes (Signed)
Cardiology Office Note   Date:  03/17/2016   ID:  [name] [name], DOB 06-04-1946, MRN [id number] ERROR No Show

## 2016-03-21 ENCOUNTER — Ambulatory Visit (INDEPENDENT_AMBULATORY_CARE_PROVIDER_SITE_OTHER): Payer: Medicare HMO | Admitting: Adult Health

## 2016-03-21 ENCOUNTER — Encounter: Payer: Self-pay | Admitting: Adult Health

## 2016-03-21 VITALS — BP 141/68 | HR 98 | Ht 73.0 in | Wt 244.0 lb

## 2016-03-21 DIAGNOSIS — I10 Essential (primary) hypertension: Secondary | ICD-10-CM

## 2016-03-21 DIAGNOSIS — I5030 Unspecified diastolic (congestive) heart failure: Secondary | ICD-10-CM | POA: Diagnosis not present
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
## 2016-03-21 NOTE — Patient Instructions (Signed)
Your physician wants you to follow-up in: 6 Months with Dr. Koneswaran. You will receive a reminder letter in the mail two months in advance. If you don't receive a letter, please call our office to schedule the follow-up appointment.  Your physician recommends that you continue on your current medications as directed. Please refer to the Current Medication list given to you today.  If you need a refill on your cardiac medications before your next appointment, please call your pharmacy.  Thank you for choosing [facility]!   

## 2016-03-21 NOTE — Progress Notes (Deleted)
Name: [name] [name]    DOB: 06/23/1946  Age: 70 y.o.  MR#: [id number]       PCP:  [name], MD      Insurance: Payor: [employer] MEDICARE / Plan: [employer] MEDICARE HMO/PPO / Product Type: *No Product type* /   CC:   No chief complaint on file.   VS Filed Vitals:   03/21/16 1455  BP: 141/68  Pulse: 98  Height: 6\' 1"  (1.854 m)  Weight: 244 lb (110.678 kg)  SpO2: 96%    Weights Current Weight  03/21/16 244 lb (110.678 kg)  12/31/15 239 lb 6.4 oz (108.591 kg)  12/25/15 262 lb (118.842 kg)    Blood Pressure  BP Readings from Last 3 Encounters:  03/21/16 141/68  12/31/15 135/45  12/25/15 151/54     Admit date:  (Not on file) Last encounter with RMR:  Visit date not found   Allergy Review of patient's allergies indicates no known allergies.  Current Outpatient Prescriptions  Medication Sig Dispense Refill  . albuterol (PROVENTIL) (2.5 MG/3ML) 0.083% nebulizer solution Take 3 mLs (2.5 mg total) by nebulization every 4 (four) hours as needed for wheezing or shortness of breath. 75 mL 12  . amLODipine (NORVASC) 5 MG tablet TAKE ONE TABLET BY MOUTH DAILY. 30 tablet 3  . aspirin EC 81 MG tablet Take 1 tablet (81 mg total) by mouth daily. 30 tablet 0  . budesonide-formoterol (SYMBICORT) 160-4.5 MCG/ACT inhaler Inhale 2 puffs into the lungs 2 (two) times daily.    . calcitRIOL (ROCALTROL) 0.25 MCG capsule Take 0.25 mcg by mouth daily.     . cephALEXin (KEFLEX) 500 MG capsule Take 1 capsule (500 mg total) by mouth 2 (two) times daily. 10 capsule 0  . cloNIDine (CATAPRES) 0.2 MG tablet TAKE (1) TABLET BY MOUTH (3) TIMES DAILY 90 tablet 0  . desmopressin (DDAVP) 0.2 MG tablet Take 1 tablet (0.2 mg total) by mouth 2 (two) times daily. 60 tablet 6  . dexamethasone (DECADRON) 0.5 MG tablet Take 1 tablet (0.5 mg total) by mouth daily. 30 tablet 6  . fluticasone (FLONASE) 50 MCG/ACT nasal spray Place 2 sprays into both nostrils daily.     Marland Kitchen levothyroxine (SYNTHROID, LEVOTHROID) 175 MCG tablet  Take 1 tablet (175 mcg total) by mouth daily before breakfast. 30 tablet 6  . metolazone (ZAROXOLYN) 5 MG tablet Take 5 mg by mouth daily.     Marland Kitchen oxyCODONE-acetaminophen (PERCOCET) 5-325 MG tablet Take 1 tablet by mouth every 4 (four) hours as needed for moderate pain. 20 tablet 0  . pantoprazole (PROTONIX) 40 MG tablet Take 1 tablet (40 mg total) by mouth 2 (two) times daily before a meal. (Patient taking differently: Take 40 mg by mouth 2 (two) times daily. ) 60 tablet 3  . phenytoin (DILANTIN) 100 MG ER capsule Take 200-300 mg by mouth daily. Takes 200 mg on Monday and Thursdsay. On all other days of the week takes 300 mg.    . polyethylene glycol powder (GLYCOLAX/MIRALAX) powder Take 17 g by mouth daily. May decrease to 17 g three times a week if needed. (Patient taking differently: Take 17 g by mouth daily. May decrease to 17 g three times a week if needed.) 255 g 3  . potassium chloride (KLOR-CON) 20 MEQ packet Take 20 mEq by mouth 2 (two) times daily.    . simvastatin (ZOCOR) 20 MG tablet Take 20 mg by mouth daily.     Marland Kitchen tiotropium (SPIRIVA) 18 MCG inhalation capsule Place 18  mcg into inhaler and inhale daily.    Marland Kitchen torsemide (DEMADEX) 20 MG tablet Take 2 tablets (40 mg total) by mouth 2 (two) times daily. 120 tablet 6  . Vitamin D, Ergocalciferol, (DRISDOL) 50000 UNITS CAPS capsule Take 1 capsule (50,000 Units total) by mouth every 7 (seven) days. Monday 12 capsule 0   No current facility-administered medications for this visit.    Discontinued Meds:   There are no discontinued medications.  Patient Active Problem List   Diagnosis Date Noted  . Septic arthritis ([place]) 12/28/2015  . Elevated troponin 12/28/2015  . Acute on chronic renal failure ([place]) 12/28/2015  . COPD with exacerbation ([place]) 12/28/2015  . Congestive heart failure (CHF) ([place]) 10/26/2015  . 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 Morbid obesity due  to excess calories ([place]) 09/14/2015  . Vitamin D deficiency 09/14/2015  . Thrombocytopenia due to blood loss   . GI bleed 03/03/2015  . Upper GI bleed 03/02/2015  . Seizures ([place]) 02/14/2015  . 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 Essential hypertension 02/14/2015  . Hemorrhoids 01/24/2015  . Constipation 01/24/2015  . Aortic insufficiency   . Sepsis ([place]) 12/06/2014  . Pyrexia   . CKD (chronic kidney disease), stage III 12/04/2014  . Chest pain 06/12/2014  . Seizure disorder ([place]) 03/27/2013  . Diabetes ([place]) 03/27/2013  . Hypertension   . Sleep apnea   . Hypothyroid   . Tobacco abuse, in remission   . Hyperlipidemia   . Panhypopituitarism ([place])   . Obese 10/28/2012  . Moderate aortic regurgitation 10/08/2012    LABS    Component Value Date/Time   NA 137 12/31/2015 0455   NA 136 12/30/2015 1550   NA 133* 12/29/2015 0410   K 3.2* 12/31/2015 0455   K 3.9 12/30/2015 1550   K 4.5 12/29/2015 0410   CL 96* 12/31/2015 0455   CL 96* 12/30/2015 1550   CL 98* 12/29/2015 0410   CO2 25 12/31/2015 0455   CO2 24 12/30/2015 1550   CO2 24 12/29/2015 0410   GLUCOSE 125* 12/31/2015 0455   GLUCOSE 136* 12/30/2015 1550   GLUCOSE 108* 12/29/2015 0410   BUN 106* 12/31/2015 0455   BUN 99* 12/30/2015 1550   BUN 82* 12/29/2015 0410   CREATININE 2.83* 12/31/2015 0455   CREATININE 2.79* 12/30/2015 1550   CREATININE 3.20* 12/29/2015 0410   CREATININE 2.98* 07/19/2015 1415   CREATININE 2.73* 07/16/2015 0910   CREATININE 2.58* 11/21/2014 0914   CALCIUM 8.7* 12/31/2015 0455   CALCIUM 8.7* 12/30/2015 1550   CALCIUM 8.5* 12/29/2015 0410   CALCIUM 8.5 09/09/2012 0536   GFRNONAA 21* 12/31/2015 0455   GFRNONAA 21* 12/30/2015 1550   GFRNONAA 18* 12/29/2015 0410   GFRNONAA 24* 11/21/2014 0914   GFRAA 24* 12/31/2015 0455   GFRAA 25* 12/30/2015 1550   GFRAA 21* 12/29/2015 0410   GFRAA 28* 11/21/2014 0914   CMP     Component Value Date/Time   NA 137 12/31/2015 0455   K 3.2* 12/31/2015  0455   CL 96* 12/31/2015 0455   CO2 25 12/31/2015 0455   GLUCOSE 125* 12/31/2015 0455   BUN 106* 12/31/2015 0455   CREATININE 2.83* 12/31/2015 0455   CREATININE 2.98* 07/19/2015 1415   CALCIUM 8.7* 12/31/2015 0455   CALCIUM 8.5 09/09/2012 0536   PROT 8.6* 12/28/2015 1220   ALBUMIN 3.7 12/28/2015 1220   AST 23 12/28/2015 1220   ALT 14* 12/28/2015 1220   ALKPHOS 50 12/28/2015  1220   BILITOT 1.4* 12/28/2015 1220   GFRNONAA 21* 12/31/2015 0455   GFRNONAA 24* 11/21/2014 0914   GFRAA 24* 12/31/2015 0455   GFRAA 28* 11/21/2014 0914       Component Value Date/Time   WBC 10.1 12/31/2015 0455   WBC 11.6* 12/29/2015 0410   WBC 12.5* 12/28/2015 1220   HGB 12.1* 12/31/2015 0455   HGB 12.1* 12/29/2015 0410   HGB 13.0 12/28/2015 1220   HCT 37.0* 12/31/2015 0455   HCT 37.3* 12/29/2015 0410   HCT 40.0 12/28/2015 1220   MCV 84.5 12/31/2015 0455   MCV 85.9 12/29/2015 0410   MCV 86.0 12/28/2015 1220    Lipid Panel     Component Value Date/Time   CHOL 157 03/22/2013 0517   TRIG 170* 03/22/2013 0517   HDL 41 03/22/2013 0517   CHOLHDL 3.8 03/22/2013 0517   VLDL 34 03/22/2013 0517   LDLCALC 82 03/22/2013 0517    ABG    Component Value Date/Time   PHART 7.459* 02/14/2015 2040   PCO2ART 36.1 02/14/2015 2040   PO2ART 68.9* 02/14/2015 2040   HCO3 25.2* 02/14/2015 2040   TCO2 23.0 02/14/2015 2040   ACIDBASEDEF 6.0* 12/12/2014 1748   O2SAT 94.4 02/14/2015 2040     Lab Results  Component Value Date   TSH 0.206* 02/01/2015   BNP (last 3 results)  Recent Labs  05/08/15 2340 07/13/15 0848 10/26/15 1612  BNP 363.0* 1699.0* 781.0*    ProBNP (last 3 results) No results for input(s): PROBNP in the last 8760 hours.  Cardiac Panel (last 3 results) No results for input(s): CKTOTAL, CKMB, TROPONINI, RELINDX in the last 72 hours.  Iron/TIBC/Ferritin/ %Sat    Component Value Date/Time   IRON 23* 12/07/2014 1918   TIBC 197* 12/07/2014 1918   FERRITIN 134 12/07/2014 1918    IRONPCTSAT 12* 12/07/2014 1918     EKG Orders placed or performed during the hospital encounter of 12/28/15  . EKG 12-Lead  . EKG 12-Lead  . EKG 12-Lead  . EKG 12-Lead  . EKG 12-Lead  . EKG 12-Lead  . EKG     Prior Assessment and Plan Problem List as of 03/21/2016      Cardiovascular and Mediastinum   Hypertension   Last Assessment & Plan 01/19/2014 Office Visit Edited 01/19/2014  3:51 PM by Lendon Colonel, NP    Blood pressure currently is controlled. We will continue him on clonidine 0.1 mg 3 times a day, amlodipine 5 mg daily. Followup BMET as discussed.      Moderate aortic regurgitation   Last Assessment & Plan 05/13/2013 Office Visit Edited 05/15/2013 12:28 PM by Yehuda Savannah, MD    I discussed possible percutaneous options with Dr. Burt Knack, and, he reports that there are none at present.  There are case reports of experimental designs that permit percutaneous aVR for aortic insufficiency, but these are not currently available. Surgical aortic valve replacement may be an option, but certainly is not an attractive one.  As long as the patient maintains his current level of functioning without recurrent pulmonary edema or other serious cardiac issues, medical therapy will be continued.      Aortic insufficiency   Hemorrhoids   Last Assessment & Plan 01/24/2015 Office Visit Edited 01/24/2015  4:03 PM by Ranae Pila, NP    Has a history of hemorrhoids which are symptomatic with rectal pain/irritation. Also with low volume hematochezia with bowel movements with chronic constipation. No other warning signs/symptoms like weight loss,  VS instability, gross change in labs, etc. Hgb low but stable at 10.4. Episode of rectal bleeding 08/08/14 with CT with no acute process identified. Is on narcotic pain medicine and colace, but no other stool softener tried. Last colonosocpy 2008 negative screening study, recommended repeat in 10 years. Likely benign anorectal source but cannot rule out  other more occult GI process. Will proceed with colonoscopy to rule out more occult process. Will also add Anusol rectal suppositroy for symptomsatic relief bid for up to 7 days at a time.  Proceed with TCS with Dr. Gala Romney in near future: the risks, benefits, and alternatives have been discussed with the patient in detail. The patient states understanding and desires to proceed.  Requested cardiology clearance due to severe aortic regurgitation with consideration of valve replacement. Will await cardiology input before scheduling.      Essential hypertension   Congestive heart failure (CHF) (HCC)     Respiratory   COPD with exacerbation [facility])     Digestive   Constipation   Last Assessment & Plan 01/24/2015 Office Visit Written 01/24/2015  4:01 PM by Ranae Pila, NP    On narcotic pain meds with colace. Continued persistent hard stools with rectal bleeding. Will add Miralax 17 g daily for improved regularity and softer stools. Can back off to three times a week if patient begins to have loose stools.       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  Following diuresis, he is no longer classified as morbidly obese.      Sleep apnea   Last Assessment & Plan 11/18/2012 Office Visit Written 11/18/2012  3:28 PM by Yehuda Savannah, MD    Sleep apnea is likely persistent and contributing to patient's multiple medical problems.  A return visit to a sleep specialist will be desirable but will be deferred for the present.      Tobacco abuse, in remission   Hyperlipidemia   Last Assessment & Plan 12/21/2012 Office Visit Written 12/21/2012  3:09 PM by Yehuda Savannah, MD    Lipid profile will be drawn.      Chest pain   Sepsis ([place])  Pyrexia   Seizures (HCC)   Morbid obesity due to excess calories (HCC)   Vitamin D deficiency   Elevated troponin       Imaging: No results found.

## 2016-03-26 ENCOUNTER — Inpatient Hospital Stay (HOSPITAL_COMMUNITY)
Admission: EM | Admit: 2016-03-26 | Discharge: 2016-03-28 | DRG: 191 | Disposition: A | Discharge status: Home Health | Payer: Medicare HMO | Attending: Internal Medicine | Admitting: Internal Medicine

## 2016-03-26 ENCOUNTER — Encounter (HOSPITAL_COMMUNITY): Payer: Self-pay

## 2016-03-26 ENCOUNTER — Emergency Department (HOSPITAL_COMMUNITY): Payer: Medicare HMO

## 2016-03-26 ENCOUNTER — Inpatient Hospital Stay (HOSPITAL_COMMUNITY): Payer: Medicare HMO

## 2016-03-26 DIAGNOSIS — I272 Other secondary pulmonary hypertension: Secondary | ICD-10-CM | POA: Diagnosis present

## 2016-03-26 DIAGNOSIS — J441 Chronic obstructive pulmonary disease with (acute) exacerbation: Secondary | ICD-10-CM | POA: Diagnosis not present

## 2016-03-26 DIAGNOSIS — E230 Hypopituitarism: Secondary | ICD-10-CM | POA: Diagnosis present

## 2016-03-26 DIAGNOSIS — Z809 Family history of malignant neoplasm, unspecified: Secondary | ICD-10-CM | POA: Diagnosis not present

## 2016-03-26 DIAGNOSIS — Z85528 Personal history of other malignant neoplasm of kidney: Secondary | ICD-10-CM | POA: Diagnosis not present

## 2016-03-26 DIAGNOSIS — I5042 Chronic combined systolic (congestive) and diastolic (congestive) heart failure: Secondary | ICD-10-CM | POA: Diagnosis present

## 2016-03-26 DIAGNOSIS — D444 Neoplasm of uncertain behavior of craniopharyngeal duct: Secondary | ICD-10-CM | POA: Diagnosis present

## 2016-03-26 DIAGNOSIS — Z87891 Personal history of nicotine dependence: Secondary | ICD-10-CM | POA: Diagnosis not present

## 2016-03-26 DIAGNOSIS — N184 Chronic kidney disease, stage 4 (severe): Secondary | ICD-10-CM

## 2016-03-26 DIAGNOSIS — Z7982 Long term (current) use of aspirin: Secondary | ICD-10-CM | POA: Diagnosis not present

## 2016-03-26 DIAGNOSIS — G40909 Epilepsy, unspecified, not intractable, without status epilepticus: Secondary | ICD-10-CM | POA: Diagnosis present

## 2016-03-26 DIAGNOSIS — Z79899 Other long term (current) drug therapy: Secondary | ICD-10-CM | POA: Diagnosis not present

## 2016-03-26 DIAGNOSIS — Z8249 Family history of ischemic heart disease and other diseases of the circulatory system: Secondary | ICD-10-CM

## 2016-03-26 DIAGNOSIS — R778 Other specified abnormalities of plasma proteins: Secondary | ICD-10-CM | POA: Diagnosis present

## 2016-03-26 DIAGNOSIS — I429 Cardiomyopathy, unspecified: Secondary | ICD-10-CM | POA: Diagnosis present

## 2016-03-26 DIAGNOSIS — E039 Hypothyroidism, unspecified: Secondary | ICD-10-CM | POA: Diagnosis present

## 2016-03-26 DIAGNOSIS — E1122 Type 2 diabetes mellitus with diabetic chronic kidney disease: Secondary | ICD-10-CM | POA: Diagnosis present

## 2016-03-26 DIAGNOSIS — Z7951 Long term (current) use of inhaled steroids: Secondary | ICD-10-CM | POA: Diagnosis not present

## 2016-03-26 DIAGNOSIS — E785 Hyperlipidemia, unspecified: Secondary | ICD-10-CM | POA: Diagnosis present

## 2016-03-26 DIAGNOSIS — R600 Localized edema: Secondary | ICD-10-CM

## 2016-03-26 DIAGNOSIS — Z905 Acquired absence of kidney: Secondary | ICD-10-CM

## 2016-03-26 DIAGNOSIS — R7989 Other specified abnormal findings of blood chemistry: Secondary | ICD-10-CM | POA: Diagnosis present

## 2016-03-26 DIAGNOSIS — I130 Hypertensive heart and chronic kidney disease with heart failure and stage 1 through stage 4 chronic kidney disease, or unspecified chronic kidney disease: Secondary | ICD-10-CM | POA: Diagnosis present

## 2016-03-26 DIAGNOSIS — R109 Unspecified abdominal pain: Secondary | ICD-10-CM

## 2016-03-26 DIAGNOSIS — M25512 Pain in left shoulder: Secondary | ICD-10-CM

## 2016-03-26 DIAGNOSIS — J9610 Chronic respiratory failure, unspecified whether with hypoxia or hypercapnia: Secondary | ICD-10-CM | POA: Diagnosis present

## 2016-03-26 DIAGNOSIS — R071 Chest pain on breathing: Secondary | ICD-10-CM

## 2016-03-26 LAB — CBC WITH DIFFERENTIAL/PLATELET
BASOS ABS: 0.1 10*3/uL (ref 0.0–0.1)
BASOS PCT: 1 %
Eosinophils Absolute: 0.7 10*3/uL (ref 0.0–0.7)
Eosinophils Relative: 8 %
HEMATOCRIT: 37.5 % — AB (ref 39.0–52.0)
HEMOGLOBIN: 12.1 g/dL — AB (ref 13.0–17.0)
Lymphocytes Relative: 11 %
Lymphs Abs: 1 10*3/uL (ref 0.7–4.0)
MCH: 29.8 pg (ref 26.0–34.0)
MCHC: 32.3 g/dL (ref 30.0–36.0)
MCV: 92.4 fL (ref 78.0–100.0)
Monocytes Absolute: 1.1 10*3/uL — ABNORMAL HIGH (ref 0.1–1.0)
Monocytes Relative: 12 %
NEUTROS ABS: 6 10*3/uL (ref 1.7–7.7)
NEUTROS PCT: 68 %
Platelets: 156 10*3/uL (ref 150–400)
RBC: 4.06 MIL/uL — AB (ref 4.22–5.81)
RDW: 16.8 % — AB (ref 11.5–15.5)
WBC: 8.8 10*3/uL (ref 4.0–10.5)

## 2016-03-26 LAB — COMPREHENSIVE METABOLIC PANEL
ALT: 22 U/L (ref 17–63)
AST: 18 U/L (ref 15–41)
Albumin: 3.7 g/dL (ref 3.5–5.0)
Alkaline Phosphatase: 44 U/L (ref 38–126)
Anion gap: 10 (ref 5–15)
BUN: 89 mg/dL — ABNORMAL HIGH (ref 6–20)
CHLORIDE: 99 mmol/L — AB (ref 101–111)
CO2: 29 mmol/L (ref 22–32)
Calcium: 9 mg/dL (ref 8.9–10.3)
Creatinine, Ser: 1.98 mg/dL — ABNORMAL HIGH (ref 0.61–1.24)
GFR, EST AFRICAN AMERICAN: 37 mL/min — AB (ref >60)
GFR, EST NON AFRICAN AMERICAN: 32 mL/min — AB (ref >60)
Glucose, Bld: 98 mg/dL (ref 65–99)
POTASSIUM: 3.8 mmol/L (ref 3.5–5.1)
SODIUM: 138 mmol/L (ref 135–145)
Total Bilirubin: 0.6 mg/dL (ref 0.3–1.2)
Total Protein: 7.6 g/dL (ref 6.5–8.1)

## 2016-03-26 LAB — BRAIN NATRIURETIC PEPTIDE: B Natriuretic Peptide: 372 pg/mL — ABNORMAL HIGH (ref 0.0–100.0)

## 2016-03-26 LAB — URIC ACID: URIC ACID, SERUM: 14.6 mg/dL — AB (ref 4.4–7.6)

## 2016-03-26 LAB — CK: CK TOTAL: 139 U/L (ref 49–397)

## 2016-03-26 LAB — TROPONIN I
TROPONIN I: 0.06 ng/mL — AB (ref <0.031)
Troponin I: 0.06 ng/mL — ABNORMAL HIGH (ref <0.031)

## 2016-03-26 LAB — MRSA PCR SCREENING: MRSA BY PCR: NEGATIVE

## 2016-03-26 LAB — LIPASE, BLOOD: LIPASE: 81 U/L — AB (ref 11–51)

## 2016-03-26 MED ORDER — TORSEMIDE 20 MG PO TABS
40.0000 mg | ORAL_TABLET | Freq: Two times a day (BID) | ORAL | Status: DC
  Administered 2016-03-27 – 2016-03-28 (×3): 40 mg via ORAL
  Filled 2016-03-26 (×3): qty 2

## 2016-03-26 MED ORDER — PANTOPRAZOLE SODIUM 40 MG PO TBEC
40.0000 mg | DELAYED_RELEASE_TABLET | Freq: Two times a day (BID) | ORAL | Status: DC
  Administered 2016-03-26 – 2016-03-28 (×4): 40 mg via ORAL
  Filled 2016-03-26 (×4): qty 1

## 2016-03-26 MED ORDER — VITAMIN D (ERGOCALCIFEROL) 1.25 MG (50000 UNIT) PO CAPS
50000.0000 [IU] | ORAL_CAPSULE | ORAL | Status: DC
  Filled 2016-03-26: qty 1

## 2016-03-26 MED ORDER — AMLODIPINE BESYLATE 5 MG PO TABS
5.0000 mg | ORAL_TABLET | Freq: Every day | ORAL | Status: DC
  Administered 2016-03-26 – 2016-03-28 (×3): 5 mg via ORAL
  Filled 2016-03-26 (×3): qty 1

## 2016-03-26 MED ORDER — MOMETASONE FURO-FORMOTEROL FUM 200-5 MCG/ACT IN AERO
INHALATION_SPRAY | RESPIRATORY_TRACT | Status: AC
  Filled 2016-03-26: qty 8.8

## 2016-03-26 MED ORDER — ACETAMINOPHEN 325 MG PO TABS: 650.0000 mg | ORAL_TABLET | Freq: Four times a day (QID) | ORAL | Status: DC | PRN

## 2016-03-26 MED ORDER — IPRATROPIUM-ALBUTEROL 0.5-2.5 (3) MG/3ML IN SOLN
3.0000 mL | Freq: Once | RESPIRATORY_TRACT | Status: DC
  Filled 2016-03-26: qty 3

## 2016-03-26 MED ORDER — ASPIRIN EC 81 MG PO TBEC
81.0000 mg | DELAYED_RELEASE_TABLET | Freq: Every day | ORAL | Status: DC
Start: 2016-03-26 — End: 2016-03-28
  Administered 2016-03-26 – 2016-03-28 (×3): 81 mg via ORAL
  Filled 2016-03-26 (×3): qty 1

## 2016-03-26 MED ORDER — ACETAMINOPHEN 500 MG PO TABS
1000.0000 mg | ORAL_TABLET | Freq: Once | ORAL | Status: AC
  Administered 2016-03-26: 1000 mg via ORAL
  Filled 2016-03-26: qty 2

## 2016-03-26 MED ORDER — ENSURE ENLIVE PO LIQD
237.0000 mL | Freq: Two times a day (BID) | ORAL | Status: DC
  Administered 2016-03-27 – 2016-03-28 (×3): 237 mL via ORAL

## 2016-03-26 MED ORDER — IPRATROPIUM-ALBUTEROL 0.5-2.5 (3) MG/3ML IN SOLN
3.0000 mL | Freq: Four times a day (QID) | RESPIRATORY_TRACT | Status: DC
  Administered 2016-03-26 – 2016-03-28 (×5): 3 mL via RESPIRATORY_TRACT
  Filled 2016-03-26 (×4): qty 3

## 2016-03-26 MED ORDER — SIMVASTATIN 20 MG PO TABS
20.0000 mg | ORAL_TABLET | Freq: Every day | ORAL | Status: DC
  Administered 2016-03-26 – 2016-03-27 (×2): 20 mg via ORAL
  Filled 2016-03-26 (×2): qty 1

## 2016-03-26 MED ORDER — DIAZEPAM 2 MG PO TABS
2.0000 mg | ORAL_TABLET | Freq: Once | ORAL | Status: AC
  Administered 2016-03-26: 2 mg via ORAL
  Filled 2016-03-26: qty 1

## 2016-03-26 MED ORDER — ENOXAPARIN SODIUM 40 MG/0.4ML ~~LOC~~ SOLN
40.0000 mg | SUBCUTANEOUS | Status: DC
  Administered 2016-03-26 – 2016-03-27 (×2): 40 mg via SUBCUTANEOUS
  Filled 2016-03-26 (×2): qty 0.4

## 2016-03-26 MED ORDER — PHENYTOIN SODIUM EXTENDED 100 MG PO CAPS
300.0000 mg | ORAL_CAPSULE | ORAL | Status: DC
  Administered 2016-03-26 – 2016-03-28 (×2): 300 mg via ORAL
  Filled 2016-03-26 (×2): qty 3

## 2016-03-26 MED ORDER — DESMOPRESSIN ACETATE 0.2 MG PO TABS
ORAL_TABLET | ORAL | Status: AC
  Filled 2016-03-26: qty 1

## 2016-03-26 MED ORDER — CETYLPYRIDINIUM CHLORIDE 0.05 % MT LIQD
7.0000 mL | Freq: Two times a day (BID) | OROMUCOSAL | Status: DC
  Administered 2016-03-27 – 2016-03-28 (×3): 7 mL via OROMUCOSAL

## 2016-03-26 MED ORDER — METHYLPREDNISOLONE SODIUM SUCC 125 MG IJ SOLR
125.0000 mg | INTRAMUSCULAR | Status: AC
  Administered 2016-03-26: 125 mg via INTRAVENOUS
  Filled 2016-03-26: qty 2

## 2016-03-26 MED ORDER — ONDANSETRON HCL 4 MG PO TABS: 4.0000 mg | ORAL_TABLET | Freq: Four times a day (QID) | ORAL | Status: DC | PRN

## 2016-03-26 MED ORDER — ONDANSETRON HCL 4 MG/2ML IJ SOLN: 4.0000 mg | Freq: Four times a day (QID) | INTRAMUSCULAR | Status: DC | PRN

## 2016-03-26 MED ORDER — POTASSIUM CHLORIDE CRYS ER 20 MEQ PO TBCR
20.0000 meq | EXTENDED_RELEASE_TABLET | Freq: Two times a day (BID) | ORAL | Status: DC
  Administered 2016-03-26 – 2016-03-28 (×4): 20 meq via ORAL
  Filled 2016-03-26 (×4): qty 1

## 2016-03-26 MED ORDER — LEVOTHYROXINE SODIUM 75 MCG PO TABS
175.0000 ug | ORAL_TABLET | Freq: Every day | ORAL | Status: DC
  Administered 2016-03-27 – 2016-03-28 (×2): 175 ug via ORAL
  Filled 2016-03-26 (×2): qty 1

## 2016-03-26 MED ORDER — ACETAMINOPHEN 650 MG RE SUPP: 650.0000 mg | Freq: Four times a day (QID) | RECTAL | Status: DC | PRN

## 2016-03-26 MED ORDER — HYDROCODONE-ACETAMINOPHEN 5-325 MG PO TABS
1.0000 | ORAL_TABLET | Freq: Four times a day (QID) | ORAL | Status: DC | PRN
  Administered 2016-03-27: 1 via ORAL
  Filled 2016-03-26: qty 1

## 2016-03-26 MED ORDER — DESMOPRESSIN ACETATE 0.2 MG PO TABS
0.2000 mg | ORAL_TABLET | Freq: Two times a day (BID) | ORAL | Status: DC
  Administered 2016-03-26 – 2016-03-28 (×4): 0.2 mg via ORAL
  Filled 2016-03-26 (×10): qty 1

## 2016-03-26 MED ORDER — METOLAZONE 5 MG PO TABS
5.0000 mg | ORAL_TABLET | Freq: Every day | ORAL | Status: DC
Start: 2016-03-26 — End: 2016-03-28
  Administered 2016-03-26 – 2016-03-28 (×3): 5 mg via ORAL
  Filled 2016-03-26 (×3): qty 1

## 2016-03-26 MED ORDER — CLONIDINE HCL 0.2 MG PO TABS
0.2000 mg | ORAL_TABLET | Freq: Three times a day (TID) | ORAL | Status: DC
  Administered 2016-03-26 – 2016-03-28 (×5): 0.2 mg via ORAL
  Filled 2016-03-26 (×5): qty 1

## 2016-03-26 MED ORDER — PHENYTOIN SODIUM EXTENDED 100 MG PO CAPS
200.0000 mg | ORAL_CAPSULE | ORAL | Status: DC
  Administered 2016-03-27: 200 mg via ORAL
  Filled 2016-03-26: qty 2

## 2016-03-26 MED ORDER — PHENYTOIN SODIUM EXTENDED 100 MG PO CAPS: 200.0000 mg | ORAL_CAPSULE | Freq: Every day | ORAL | Status: DC

## 2016-03-26 MED ORDER — FLUTICASONE PROPIONATE 50 MCG/ACT NA SUSP
2.0000 | Freq: Every day | NASAL | Status: DC
  Administered 2016-03-27 – 2016-03-28 (×2): 2 via NASAL
  Filled 2016-03-26 (×3): qty 16

## 2016-03-26 MED ORDER — METHYLPREDNISOLONE SODIUM SUCC 125 MG IJ SOLR
60.0000 mg | Freq: Two times a day (BID) | INTRAMUSCULAR | Status: DC
  Administered 2016-03-27 – 2016-03-28 (×3): 60 mg via INTRAVENOUS
  Filled 2016-03-26 (×3): qty 2

## 2016-03-26 MED ORDER — DIATRIZOATE MEGLUMINE & SODIUM 66-10 % PO SOLN
ORAL | Status: AC
  Filled 2016-03-26: qty 30

## 2016-03-26 MED ORDER — MOMETASONE FURO-FORMOTEROL FUM 200-5 MCG/ACT IN AERO
2.0000 | INHALATION_SPRAY | Freq: Two times a day (BID) | RESPIRATORY_TRACT | Status: DC
  Administered 2016-03-27 – 2016-03-28 (×3): 2 via RESPIRATORY_TRACT
  Filled 2016-03-26: qty 8.8

## 2016-03-26 MED ORDER — CALCITRIOL 0.25 MCG PO CAPS
0.5000 ug | ORAL_CAPSULE | Freq: Two times a day (BID) | ORAL | Status: DC
  Administered 2016-03-26 – 2016-03-28 (×4): 0.5 ug via ORAL
  Filled 2016-03-26 (×4): qty 2

## 2016-03-26 MED ORDER — SODIUM CHLORIDE 0.9% FLUSH
3.0000 mL | Freq: Two times a day (BID) | INTRAVENOUS | Status: DC
  Administered 2016-03-26 – 2016-03-28 (×4): 3 mL via INTRAVENOUS

## 2016-03-26 NOTE — ED Notes (Signed)
Pt reports neck, chest, and left arm pain x 2 days.  Reports hurts to move neck.  Pt holding head over to the right and grimaces with any movement.  Pt says chest hurts worse when he moves as well.  Pt also says abd feels sore with coughing.
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
## 2016-03-27 ENCOUNTER — Inpatient Hospital Stay (HOSPITAL_COMMUNITY): Payer: Medicare HMO

## 2016-03-27 ENCOUNTER — Encounter (HOSPITAL_COMMUNITY): Payer: Self-pay

## 2016-03-27 DIAGNOSIS — N184 Chronic kidney disease, stage 4 (severe): Secondary | ICD-10-CM

## 2016-03-27 DIAGNOSIS — R7989 Other specified abnormal findings of blood chemistry: Secondary | ICD-10-CM

## 2016-03-27 DIAGNOSIS — E230 Hypopituitarism: Secondary | ICD-10-CM

## 2016-03-27 DIAGNOSIS — J441 Chronic obstructive pulmonary disease with (acute) exacerbation: Principal | ICD-10-CM

## 2016-03-27 LAB — BASIC METABOLIC PANEL
ANION GAP: 11 (ref 5–15)
BUN: 88 mg/dL — ABNORMAL HIGH (ref 6–20)
CALCIUM: 9.2 mg/dL (ref 8.9–10.3)
CO2: 26 mmol/L (ref 22–32)
Chloride: 98 mmol/L — ABNORMAL LOW (ref 101–111)
Creatinine, Ser: 2.04 mg/dL — ABNORMAL HIGH (ref 0.61–1.24)
GFR calc non Af Amer: 31 mL/min — ABNORMAL LOW (ref >60)
GFR, EST AFRICAN AMERICAN: 36 mL/min — AB (ref >60)
GLUCOSE: 176 mg/dL — AB (ref 65–99)
POTASSIUM: 4.3 mmol/L (ref 3.5–5.1)
Sodium: 135 mmol/L (ref 135–145)

## 2016-03-27 LAB — URINALYSIS, ROUTINE W REFLEX MICROSCOPIC
Bilirubin Urine: NEGATIVE
GLUCOSE, UA: NEGATIVE mg/dL
Hgb urine dipstick: NEGATIVE
KETONES UR: NEGATIVE mg/dL
LEUKOCYTES UA: NEGATIVE
Nitrite: NEGATIVE
PH: 6 (ref 5.0–8.0)
Protein, ur: NEGATIVE mg/dL

## 2016-03-27 LAB — TROPONIN I: Troponin I: 0.05 ng/mL — ABNORMAL HIGH (ref <0.031)

## 2016-03-27 LAB — CBC
HEMATOCRIT: 41.1 % (ref 39.0–52.0)
HEMOGLOBIN: 13.2 g/dL (ref 13.0–17.0)
MCH: 29.6 pg (ref 26.0–34.0)
MCHC: 32.1 g/dL (ref 30.0–36.0)
MCV: 92.2 fL (ref 78.0–100.0)
Platelets: 184 10*3/uL (ref 150–400)
RBC: 4.46 MIL/uL (ref 4.22–5.81)
RDW: 16.8 % — ABNORMAL HIGH (ref 11.5–15.5)
WBC: 8.1 10*3/uL (ref 4.0–10.5)

## 2016-03-27 MED ORDER — TECHNETIUM TO 99M ALBUMIN AGGREGATED
3.0000 | Freq: Once | INTRAVENOUS | Status: AC | PRN
Start: 2016-03-27 — End: 2016-03-27
  Administered 2016-03-27: 3 via INTRAVENOUS

## 2016-03-27 MED ORDER — CYCLOBENZAPRINE HCL 10 MG PO TABS
5.0000 mg | ORAL_TABLET | Freq: Three times a day (TID) | ORAL | Status: DC | PRN
  Administered 2016-03-27: 5 mg via ORAL
  Filled 2016-03-27: qty 1

## 2016-03-27 MED ORDER — TECHNETIUM TC 99M DIETHYLENETRIAME-PENTAACETIC ACID
30.0000 | Freq: Once | INTRAVENOUS | Status: AC | PRN
  Administered 2016-03-27: 30 via INTRAVENOUS

## 2016-03-27 NOTE — Progress Notes (Signed)
PROGRESS NOTE    [name] [name]  [id number] DOB: 03-19-1946 DOA: 03/26/2016 PCP: [name], MD    Assessment & Plan:   Active Problems:   Panhypopituitarism (HCC)   Craniopharyngioma (HCC)   Elevated troponin   COPD exacerbation (HCC)   CKD (chronic kidney disease) stage 4, GFR 15-29 ml/min (HCC)   1. Atypical chest pain. Suspect this is musculoskeletal due to left shoulder/neck pain. 2. Elevated troponin. This may be a demand ischemia in the setting of COPD. Patient also has chronically elevated troponins, likely related to CKD and cardiomyopathy. [name] not having any typical chest pain at this time 3. COPD exacerbation. Treated with IV steroids with improvement. Continue bronchodilators. Respiratory viral panel has been ordered. VQ scan was unremarkable. 4. Chronic systolic and diastolic congestive heart failure. Appears to be compensated. Continue torsemide and metolazone. 5. Abdominal pain. Appears to have resolved. CT of the abdomen and pelvis does not show evidence of pancreatitis. Mildly of his lipase, likely related to decreased clearance in kidney disease. 6. Chronic kidney disease stage IV. Creatinine appears to be at baseline. Continue to monitor 7. Panhypopituitarism. Status post resection of craniopharyngioma. Continue Synthroid and DDAVP. Patient reports that he is not on steroids. 8. Hypertension. Continue amlodipine and clonidine. 9. Lower extremity edema. Likely related to venous stasis is appears chronic. Venous Dopplers are negative for DVT. 10. Left neck/shoulder pain. Suspect this is musculoskeletal. X-ray left shoulder shows degenerative changes, no acute findings. Continue the patient on pain medications. Will start on Flexeril. If symptoms persist, may need further imaging with MRI C spine. 11. Seizure disorder. Continue Dilantin.   DVT prophylaxis: lovenox Code Status: full code Family Communication: discussed with patient Disposition Plan: discharge  home, possibly in AM   Consultants:      Procedures:   Antimicrobials:    Subjective: Continues to have pain in his neck and left shoulder. Overall breathing is improving  Objective: Filed Vitals:   03/27/16 0847 03/27/16 0852 03/27/16 1441 03/27/16 1513  BP:   146/44   Pulse:   89   Temp:   98.4 F (36.9 C)   TempSrc:   Oral   Resp:   18   Height:      Weight:      SpO2: 94% 94% 96% 94%    Intake/Output Summary (Last 24 hours) at 03/27/16 1828 Last data filed at 03/27/16 1759  Gross per 24 hour  Intake    723 ml  Output    950 ml  Net   -227 ml   Filed Weights   03/26/16 1654 03/26/16 2054  Weight: 112.492 kg (248 lb) 108.636 kg (239 lb 8 oz)    Examination:  General exam: Appears calm and comfortable  Respiratory system: Clear to auscultation. Respiratory effort normal. Cardiovascular system: S1 & S2 heard, RRR. No JVD, murmurs, rubs, gallops or clicks. No pedal edema. Gastrointestinal system: Abdomen is nondistended, soft and nontender. No organomegaly or masses felt. Normal bowel sounds heard. Central nervous system: Alert and oriented. No focal neurological deficits. Extremities: decreased range of motion on extension of neck as as turning head to left. Skin: No rashes, lesions or ulcers Psychiatry: Judgement and insight appear normal. Mood & affect appropriate.     Data Reviewed: I have personally reviewed following labs and imaging studies  CBC:  Recent Labs Lab 03/26/16 1734 03/27/16 0339  WBC 8.8 8.1  NEUTROABS 6.0  --   HGB 12.1* 13.2  HCT 37.5* 41.1  MCV 92.4 92.2  PLT 156 [id number]   Basic Metabolic Panel:  Recent Labs Lab 03/26/16 1734 03/27/16 0339  NA 138 135  K 3.8 4.3  CL 99* 98*  CO2 29 26  GLUCOSE 98 176*  BUN 89* 88*  CREATININE 1.98* 2.04*  CALCIUM 9.0 9.2   GFR: Estimated Creatinine Clearance: 42.9 mL/min (by C-G formula based on Cr of 2.04). Liver Function Tests:  Recent Labs Lab 03/26/16 1734  AST 18  ALT  22  ALKPHOS 44  BILITOT 0.6  PROT 7.6  ALBUMIN 3.7    Recent Labs Lab 03/26/16 1734  LIPASE 81*   No results for input(s): AMMONIA in the last 168 hours. Coagulation Profile: No results for input(s): INR, PROTIME in the last 168 hours. Cardiac Enzymes:  Recent Labs Lab 03/26/16 1734 03/26/16 2153 03/27/16 0339  CKTOTAL  --  139  --   TROPONINI 0.06* 0.06* 0.05*   BNP (last 3 results) No results for input(s): PROBNP in the last 8760 hours. HbA1C: No results for input(s): HGBA1C in the last 72 hours. CBG: No results for input(s): GLUCAP in the last 168 hours. Lipid Profile: No results for input(s): CHOL, HDL, LDLCALC, TRIG, CHOLHDL, LDLDIRECT in the last 72 hours. Thyroid Function Tests: No results for input(s): TSH, T4TOTAL, FREET4, T3FREE, THYROIDAB in the last 72 hours. Anemia Panel: No results for input(s): VITAMINB12, FOLATE, FERRITIN, TIBC, IRON, RETICCTPCT in the last 72 hours. Urine analysis:    Component Value Date/Time   COLORURINE YELLOW 03/27/2016 0107   APPEARANCEUR CLEAR 03/27/2016 0107   LABSPEC <1.005* 03/27/2016 0107   PHURINE 6.0 03/27/2016 0107   GLUCOSEU NEGATIVE 03/27/2016 0107   HGBUR NEGATIVE 03/27/2016 0107   BILIRUBINUR NEGATIVE 03/27/2016 0107   KETONESUR NEGATIVE 03/27/2016 0107   PROTEINUR NEGATIVE 03/27/2016 0107   UROBILINOGEN 0.2 06/25/2015 1353   NITRITE NEGATIVE 03/27/2016 0107   LEUKOCYTESUR NEGATIVE 03/27/2016 0107   Sepsis Labs: @LABRCNTIP (procalcitonin:4,lacticidven:4)  ) Recent Results (from the past 240 hour(s))  MRSA PCR Screening     Status: None   Collection Time: 03/26/16  9:00 PM  Result Value Ref Range Status   MRSA by PCR NEGATIVE NEGATIVE Final    Comment:        The GeneXpert MRSA Assay (FDA approved for NASAL specimens only), is one component of a comprehensive MRSA colonization surveillance program. It is not intended to diagnose MRSA infection nor to guide or monitor treatment for MRSA  infections.          Radiology Studies: Ct Abdomen Pelvis Wo Contrast  03/27/2016  CLINICAL DATA:  Diffuse abdominal pain. EXAM: CT ABDOMEN AND PELVIS WITHOUT CONTRAST TECHNIQUE: Multidetector CT imaging of the abdomen and pelvis was performed following the standard protocol without IV contrast. COMPARISON:  CT 12/06/2014, 08/08/2014 FINDINGS: Lower chest: The included lung bases are clear. 11 mm right lower lobe nodule is unchanged from prior exams dating back to 08/08/2014, stability over almost 2 years suggests benign process. Multi chamber cardiomegaly. Right lateral chest wall lipoma measures 9 cm. Liver: No focal lesion allowing for lack contrast. Hepatobiliary: Gallbladder mildly distended but without calcified gallstone. No pericholecystic inflammation. No evidence of biliary dilatation. Pancreas: No ductal dilatation or inflammation. No findings to suggest pancreatitis. Spleen: Normal. Adrenal glands: No nodule. Kidneys: Post right nephrectomy. Perinephric stranding about the left kidney. 12 mm hyperdense cyst in the upper left kidney and additional cortical hyperdensity, unchanged. Additional partially exophytic rounded densities isodense to adjacent renal parenchyma appear similar, incompletely characterized without contrast. Stomach/Bowel: Stomach physiologically  distended. There are no dilated or thickened small bowel loops. No bowel obstruction, enteric contrast throughout the colon. There is sigmoid colonic tortuosity. Small volume of stool throughout the colon without colonic wall thickening. The appendix is not confidently identified. Vascular/Lymphatic: No retroperitoneal adenopathy. Abdominal aorta is normal in caliber. Atherosclerosis of the abdominal aorta without aneurysm. Reproductive: Prostate gland normal in size. Bladder: Decompressed. Other: No free air, free fluid, or intra-abdominal fluid collection. Musculoskeletal: There are no acute or suspicious osseous abnormalities. Mild  degenerative change in the spine. IMPRESSION: 1. No acute abnormality in the abdomen/pelvis to explain abdominal pain. No peripancreatic inflammation. 2. Pulmonary nodule in the right lower lobe measures 11 mm. This is unchanged dating back to September 2015. A follow-up chest CT in 6 months would confirm imaging stability over the course of 2 years. Electronically Signed   By: Jeb Levering M.D.   On: 03/27/2016 00:15   Dg Chest 2 View  03/26/2016  CLINICAL DATA:  Chest pain EXAM: CHEST  2 VIEW COMPARISON:  12/28/2015 chest radiograph. FINDINGS: Stable cardiomediastinal silhouette with mild cardiomegaly. No pneumothorax. No pleural effusion. No overt pulmonary edema. No acute consolidative airspace disease. Right nephrectomy clips are seen in the right upper quadrant of the abdomen. IMPRESSION: Stable mild cardiomegaly without overt pulmonary edema. No active pulmonary disease. Electronically Signed   By: Ilona Sorrel M.D.   On: 03/26/2016 17:56   Nm Pulmonary Perf And Vent  03/27/2016  CLINICAL DATA:  Chest and neck pain. EXAM: NUCLEAR MEDICINE VENTILATION - PERFUSION LUNG SCAN TECHNIQUE: Ventilation images were obtained in multiple projections using inhaled aerosol Tc-37m DTPA. Perfusion images were obtained in multiple projections after intravenous injection of Tc-87m MAA. RADIOPHARMACEUTICALS:  32.0 mCi Technetium-49m DTPA aerosol inhalation and 4.2 mCi Technetium-2m MAA IV COMPARISON:  Chest radiograph, 03/26/2016 FINDINGS: Ventilation: No focal ventilation defect. Perfusion: No wedge shaped peripheral perfusion defects to suggest acute pulmonary embolism. IMPRESSION: No evidence of a pulmonary embolism.  Normal exam. Electronically Signed   By: Lajean Manes M.D.   On: 03/27/2016 10:55   US Venous Img Lower Bilateral  03/27/2016  CLINICAL DATA:  Bilateral lower extremity pain and edema, left greater than right. History of smoking and renal cell carcinoma. Evaluate for DVT. EXAM: BILATERAL LOWER  EXTREMITY VENOUS DOPPLER ULTRASOUND TECHNIQUE: Gray-scale sonography with graded compression, as well as color Doppler and duplex ultrasound were performed to evaluate the lower extremity deep venous systems from the level of the common femoral vein and including the common femoral, femoral, profunda femoral, popliteal and calf veins including the posterior tibial, peroneal and gastrocnemius veins when visible. The superficial great saphenous vein was also interrogated. Spectral Doppler was utilized to evaluate flow at rest and with distal augmentation maneuvers in the common femoral, femoral and popliteal veins. COMPARISON:  None. FINDINGS: RIGHT LOWER EXTREMITY Common Femoral Vein: No evidence of thrombus. Normal compressibility, respiratory phasicity and response to augmentation. Saphenofemoral Junction: No evidence of thrombus. Normal compressibility and flow on color Doppler imaging. Profunda Femoral Vein: No evidence of thrombus. Normal compressibility and flow on color Doppler imaging. Femoral Vein: No evidence of thrombus. Normal compressibility, respiratory phasicity and response to augmentation. Popliteal Vein: No evidence of thrombus. Normal compressibility, respiratory phasicity and response to augmentation. Calf Veins: No evidence of thrombus. Normal compressibility and flow on color Doppler imaging. Superficial Great Saphenous Vein: No evidence of thrombus. Normal compressibility and flow on color Doppler imaging. Venous Reflux:  None. Other Findings:  None. LEFT LOWER EXTREMITY Common Femoral Vein:  No evidence of thrombus. Normal compressibility, respiratory phasicity and response to augmentation. Saphenofemoral Junction: No evidence of thrombus. Normal compressibility and flow on color Doppler imaging. Profunda Femoral Vein: No evidence of thrombus. Normal compressibility and flow on color Doppler imaging. Femoral Vein: No evidence of thrombus. Normal compressibility, respiratory phasicity and  response to augmentation. Popliteal Vein: No evidence of thrombus. Normal compressibility, respiratory phasicity and response to augmentation. Calf Veins: No evidence of thrombus. Normal compressibility and flow on color Doppler imaging. Superficial Great Saphenous Vein: No evidence of thrombus. Normal compressibility and flow on color Doppler imaging. Venous Reflux:  None. Other Findings:  None. IMPRESSION: No evidence of DVT within either lower extremity. Electronically Signed   By: Sandi Mariscal M.D.   On: 03/27/2016 11:32   Dg Shoulder Left  03/26/2016  CLINICAL DATA:  Left shoulder pain for 2 days. EXAM: LEFT SHOULDER - 2+ VIEW COMPARISON:  None. FINDINGS: No fracture or dislocation. The alignment and joint spaces are maintained. No bony destructive change. Mild proliferative change at the acromioclavicular joint. Trace glenoid osteophytes. IMPRESSION: Mild degenerative change.  No acute bony abnormality. Electronically Signed   By: Jeb Levering M.D.   On: 03/26/2016 23:55        Scheduled Meds: . amLODipine  5 mg Oral Daily  . antiseptic oral rinse  7 mL Mouth Rinse BID  . aspirin EC  81 mg Oral Daily  . calcitRIOL  0.5 mcg Oral BID  . cloNIDine  0.2 mg Oral TID  . desmopressin  0.2 mg Oral BID  . enoxaparin (LOVENOX) injection  40 mg Subcutaneous Q24H  . feeding supplement (ENSURE ENLIVE)  237 mL Oral BID BM  . fluticasone  2 spray Each Nare Daily  . ipratropium-albuterol  3 mL Nebulization Q6H  . levothyroxine  175 mcg Oral QAC breakfast  . methylPREDNISolone (SOLU-MEDROL) injection  60 mg Intravenous Q12H  . metolazone  5 mg Oral Daily  . mometasone-formoterol  2 puff Inhalation BID  . pantoprazole  40 mg Oral BID  . phenytoin  200 mg Oral Once per day on Mon Thu   And  . phenytoin  300 mg Oral Once per day on Sun Tue Wed Fri Sat  . potassium chloride SA  20 mEq Oral BID  . simvastatin  20 mg Oral q1800  . sodium chloride flush  3 mL Intravenous Q12H  . torsemide  40 mg  Oral BID  . [START ON 03/31/2016] Vitamin D (Ergocalciferol)  50,000 Units Oral Q7 days   Continuous Infusions:    LOS: 1 day    Time spent: 1mins    Kathie Dike, MD [facility] Pager [phone]  If 7PM-7AM, please contact night-coverage [email or web address] Password [facility] 03/27/2016, 6:28 PM

## 2016-03-27 NOTE — Progress Notes (Signed)
Nutrition Brief Note  Patient identified on the Malnutrition Screening Tool (MST) Report  Wt Readings from Last 15 Encounters:  03/26/16 239 lb 8 oz (108.636 kg)  03/21/16 244 lb (110.678 kg)  12/31/15 239 lb 6.4 oz (108.591 kg)  12/25/15 262 lb (118.842 kg)  12/18/15 262 lb (118.842 kg)  11/01/15 254 lb 13.6 oz (115.6 kg)  09/14/15 269 lb (122.018 kg)  09/05/15 259 lb (117.482 kg)  07/19/15 254 lb (115.214 kg)  07/13/15 260 lb (117.935 kg)  06/27/15 258 lb 11.2 oz (117.346 kg)  05/09/15 239 lb 1.6 oz (108.455 kg)  04/05/15 248 lb 3.2 oz (112.583 kg)  03/09/15 262 lb (118.842 kg)  02/19/15 242 lb (109.77 kg)    Body mass index is 31.6 kg/(m^2). Patient meets criteria for Obesebased on current BMI.   Pt states that he is eating well at this time w/o symptoms or loss of appetite. He reports that he lost 10 lbs related to an acute case of cellulitis (and related loss of appetite) he had a couple months ago. He denies any lost weight relating to his current problems.   Pt's UBW is difficult to determine as he has CKD4 + CHF. Per chart review he has admissions in which edema is present with changes being made to his diuretic regimen.   Current diet order is [facility]. Per visual assessment, pt ate 100% of his lunch.  Labs and medications reviewed.   No nutrition interventions warranted at this time. If nutrition issues arise, please consult RD.   Burtis Junes RD, LDN Clinical Nutrition Pager: [phone] 03/27/2016 1:20 PM

## 2016-03-28 LAB — URINE CULTURE: CULTURE: NO GROWTH

## 2016-03-28 LAB — RESPIRATORY VIRUS PANEL
ADENOVIRUS: NEGATIVE
INFLUENZA A: NEGATIVE
INFLUENZA B 1: NEGATIVE
METAPNEUMOVIRUS: NEGATIVE
PARAINFLUENZA 1 A: NEGATIVE
PARAINFLUENZA 3 A: NEGATIVE
Parainfluenza 2: NEGATIVE
RESPIRATORY SYNCYTIAL VIRUS A: NEGATIVE
Respiratory Syncytial Virus B: NEGATIVE
Rhinovirus: NEGATIVE

## 2016-03-28 MED ORDER — METOLAZONE 5 MG PO TABS: 5.0000 mg | ORAL_TABLET | Freq: Every day | ORAL | Status: DC

## 2016-03-28 MED ORDER — ALBUTEROL SULFATE (2.5 MG/3ML) 0.083% IN NEBU: 2.5000 mg | INHALATION_SOLUTION | Freq: Four times a day (QID) | RESPIRATORY_TRACT | Status: DC | PRN

## 2016-03-28 MED ORDER — IPRATROPIUM-ALBUTEROL 0.5-2.5 (3) MG/3ML IN SOLN: 3.0000 mL | Freq: Two times a day (BID) | RESPIRATORY_TRACT | Status: DC

## 2016-03-28 NOTE — Care Management Note (Signed)
Case Management Note  Patient Details  Name: INDIO SAMP MRN: [id number] Date of Birth: 12/30/1945                  Pt admitted with COPD exacerbation. Pt has home O2 and is ind with ADL's. Pt has PCP, transportation to appointments and is able to afford his medications. Pt states he has no needs at DC. MD has ordered [facility] RN for f/u and lab draws. Pt has chosen [facility] from facility list. Romualdo Bolk, of [facility], made aware of referral and will obtain pt info from chart. Pt is aware HH has 48 hours to initiate services. Pt discharging home today.      Expected Discharge Date:  03/28/16               Expected Discharge Plan:  [place]  In-House Referral:  NA  Discharge planning Services  CM Consult  Post Acute Care Choice:  Home Health Choice offered to:  Patient  DME Arranged:    DME Agency:     HH Arranged:  RN [place] Agency:  [place]  Status of Service:  Completed, signed off  Medicare Important Message Given:  N/A - LOS <3 / Initial given by admissions Date Medicare IM Given:    Medicare IM give by:    Date Additional Medicare IM Given:    Additional Medicare Important Message give by:     If discussed at [place] of Stay Meetings, dates discussed:    Additional Comments:  Sherald Barge, RN 03/28/2016, 9:32 AM

## 2016-03-28 NOTE — Discharge Summary (Signed)
Physician Discharge Summary  Patient ID: [name] [name] MRN: [id number] DOB/AGE: August 05, 1946 70 y.o. Primary Care Physician:FANTA,TESFAYE, MD Admit date: 03/26/2016 Discharge date: 03/28/2016    Discharge Diagnoses:   Active Problems:   Panhypopituitarism (HCC)   Craniopharyngioma (HCC)   Elevated troponin   COPD exacerbation (HCC)   CKD (chronic kidney disease) stage 4, GFR 15-29 ml/min (HCC) Atypical chest pain Chronic systolic and diastolic heart failure     Medication List    TAKE these medications        ADVAIR DISKUS 250-50 MCG/DOSE Aepb  Generic drug:  Fluticasone-Salmeterol  Inhale 1 puff into the lungs 2 (two) times daily.     albuterol (2.5 MG/3ML) 0.083% nebulizer solution  Commonly known as:  PROVENTIL  Take 3 mLs (2.5 mg total) by nebulization every 4 (four) hours as needed for wheezing or shortness of breath.     amLODipine 5 MG tablet  Commonly known as:  NORVASC  TAKE ONE TABLET BY MOUTH DAILY.     aspirin EC 81 MG tablet  Take 1 tablet (81 mg total) by mouth daily.     budesonide-formoterol 160-4.5 MCG/ACT inhaler  Commonly known as:  SYMBICORT  Inhale 2 puffs into the lungs 2 (two) times daily.     calcitRIOL 0.5 MCG capsule  Commonly known as:  ROCALTROL  Take 0.5 mcg by mouth 2 (two) times daily.     cephALEXin 500 MG capsule  Commonly known as:  KEFLEX  Take 1 capsule (500 mg total) by mouth 2 (two) times daily.     cloNIDine 0.2 MG tablet  Commonly known as:  CATAPRES  TAKE (1) TABLET BY MOUTH (3) TIMES DAILY     desmopressin 0.2 MG tablet  Commonly known as:  DDAVP  Take 1 tablet (0.2 mg total) by mouth 2 (two) times daily.     dexamethasone 0.5 MG tablet  Commonly known as:  DECADRON  Take 1 tablet (0.5 mg total) by mouth daily.     fluticasone 50 MCG/ACT nasal spray  Commonly known as:  FLONASE  Place 2 sprays into both nostrils daily.     levothyroxine 175 MCG tablet  Commonly known as:  SYNTHROID, LEVOTHROID  Take 1 tablet  (175 mcg total) by mouth daily before breakfast.     metolazone 5 MG tablet  Commonly known as:  ZAROXOLYN  Take 1 tablet (5 mg total) by mouth daily. Do not take until we get the results from your blood test on Monday     oxyCODONE-acetaminophen 5-325 MG tablet  Commonly known as:  PERCOCET  Take 1 tablet by mouth every 4 (four) hours as needed for moderate pain.     pantoprazole 40 MG tablet  Commonly known as:  PROTONIX  Take 1 tablet (40 mg total) by mouth 2 (two) times daily before a meal.     phenytoin 100 MG ER capsule  Commonly known as:  DILANTIN  Take 200-300 mg by mouth daily. Takes 200 mg on Monday and Thursdsay. On all other days of the week takes 300 mg.     polyethylene glycol powder powder  Commonly known as:  GLYCOLAX/MIRALAX  Take 17 g by mouth daily. May decrease to 17 g three times a week if needed.     potassium chloride SA 20 MEQ tablet  Commonly known as:  K-DUR,KLOR-CON  Take 20 mEq by mouth 2 (two) times daily.     simvastatin 20 MG tablet  Commonly known as:  ZOCOR  Take  20 mg by mouth daily.     tiotropium 18 MCG inhalation capsule  Commonly known as:  SPIRIVA  Place 18 mcg into inhaler and inhale daily.     torsemide 20 MG tablet  Commonly known as:  DEMADEX  Take 2 tablets (40 mg total) by mouth 2 (two) times daily.     Vitamin D (Ergocalciferol) 50000 units Caps capsule  Commonly known as:  DRISDOL  Take 1 capsule (50,000 Units total) by mouth every 7 (seven) days. Monday        Discharged Condition:Improved    Consults: None  Significant Diagnostic Studies: Ct Abdomen Pelvis Wo Contrast  03/27/2016  CLINICAL DATA:  Diffuse abdominal pain. EXAM: CT ABDOMEN AND PELVIS WITHOUT CONTRAST TECHNIQUE: Multidetector CT imaging of the abdomen and pelvis was performed following the standard protocol without IV contrast. COMPARISON:  CT 12/06/2014, 08/08/2014 FINDINGS: Lower chest: The included lung bases are clear. 11 mm right lower lobe nodule  is unchanged from prior exams dating back to 08/08/2014, stability over almost 2 years suggests benign process. Multi chamber cardiomegaly. Right lateral chest wall lipoma measures 9 cm. Liver: No focal lesion allowing for lack contrast. Hepatobiliary: Gallbladder mildly distended but without calcified gallstone. No pericholecystic inflammation. No evidence of biliary dilatation. Pancreas: No ductal dilatation or inflammation. No findings to suggest pancreatitis. Spleen: Normal. Adrenal glands: No nodule. Kidneys: Post right nephrectomy. Perinephric stranding about the left kidney. 12 mm hyperdense cyst in the upper left kidney and additional cortical hyperdensity, unchanged. Additional partially exophytic rounded densities isodense to adjacent renal parenchyma appear similar, incompletely characterized without contrast. Stomach/Bowel: Stomach physiologically distended. There are no dilated or thickened small bowel loops. No bowel obstruction, enteric contrast throughout the colon. There is sigmoid colonic tortuosity. Small volume of stool throughout the colon without colonic wall thickening. The appendix is not confidently identified. Vascular/Lymphatic: No retroperitoneal adenopathy. Abdominal aorta is normal in caliber. Atherosclerosis of the abdominal aorta without aneurysm. Reproductive: Prostate gland normal in size. Bladder: Decompressed. Other: No free air, free fluid, or intra-abdominal fluid collection. Musculoskeletal: There are no acute or suspicious osseous abnormalities. Mild degenerative change in the spine. IMPRESSION: 1. No acute abnormality in the abdomen/pelvis to explain abdominal pain. No peripancreatic inflammation. 2. Pulmonary nodule in the right lower lobe measures 11 mm. This is unchanged dating back to September 2015. A follow-up chest CT in 6 months would confirm imaging stability over the course of 2 years. Electronically Signed   By: Jeb Levering M.D.   On: 03/27/2016 00:15   Dg  Chest 2 View  03/26/2016  CLINICAL DATA:  Chest pain EXAM: CHEST  2 VIEW COMPARISON:  12/28/2015 chest radiograph. FINDINGS: Stable cardiomediastinal silhouette with mild cardiomegaly. No pneumothorax. No pleural effusion. No overt pulmonary edema. No acute consolidative airspace disease. Right nephrectomy clips are seen in the right upper quadrant of the abdomen. IMPRESSION: Stable mild cardiomegaly without overt pulmonary edema. No active pulmonary disease. Electronically Signed   By: Ilona Sorrel M.D.   On: 03/26/2016 17:56   Nm Pulmonary Perf And Vent  03/27/2016  CLINICAL DATA:  Chest and neck pain. EXAM: NUCLEAR MEDICINE VENTILATION - PERFUSION LUNG SCAN TECHNIQUE: Ventilation images were obtained in multiple projections using inhaled aerosol Tc-59m DTPA. Perfusion images were obtained in multiple projections after intravenous injection of Tc-58m MAA. RADIOPHARMACEUTICALS:  32.0 mCi Technetium-91m DTPA aerosol inhalation and 4.2 mCi Technetium-50m MAA IV COMPARISON:  Chest radiograph, 03/26/2016 FINDINGS: Ventilation: No focal ventilation defect. Perfusion: No wedge shaped peripheral perfusion defects  to suggest acute pulmonary embolism. IMPRESSION: No evidence of a pulmonary embolism.  Normal exam. Electronically Signed   By: Lajean Manes M.D.   On: 03/27/2016 10:55   US Venous Img Lower Bilateral  03/27/2016  CLINICAL DATA:  Bilateral lower extremity pain and edema, left greater than right. History of smoking and renal cell carcinoma. Evaluate for DVT. EXAM: BILATERAL LOWER EXTREMITY VENOUS DOPPLER ULTRASOUND TECHNIQUE: Gray-scale sonography with graded compression, as well as color Doppler and duplex ultrasound were performed to evaluate the lower extremity deep venous systems from the level of the common femoral vein and including the common femoral, femoral, profunda femoral, popliteal and calf veins including the posterior tibial, peroneal and gastrocnemius veins when visible. The superficial  great saphenous vein was also interrogated. Spectral Doppler was utilized to evaluate flow at rest and with distal augmentation maneuvers in the common femoral, femoral and popliteal veins. COMPARISON:  None. FINDINGS: RIGHT LOWER EXTREMITY Common Femoral Vein: No evidence of thrombus. Normal compressibility, respiratory phasicity and response to augmentation. Saphenofemoral Junction: No evidence of thrombus. Normal compressibility and flow on color Doppler imaging. Profunda Femoral Vein: No evidence of thrombus. Normal compressibility and flow on color Doppler imaging. Femoral Vein: No evidence of thrombus. Normal compressibility, respiratory phasicity and response to augmentation. Popliteal Vein: No evidence of thrombus. Normal compressibility, respiratory phasicity and response to augmentation. Calf Veins: No evidence of thrombus. Normal compressibility and flow on color Doppler imaging. Superficial Great Saphenous Vein: No evidence of thrombus. Normal compressibility and flow on color Doppler imaging. Venous Reflux:  None. Other Findings:  None. LEFT LOWER EXTREMITY Common Femoral Vein: No evidence of thrombus. Normal compressibility, respiratory phasicity and response to augmentation. Saphenofemoral Junction: No evidence of thrombus. Normal compressibility and flow on color Doppler imaging. Profunda Femoral Vein: No evidence of thrombus. Normal compressibility and flow on color Doppler imaging. Femoral Vein: No evidence of thrombus. Normal compressibility, respiratory phasicity and response to augmentation. Popliteal Vein: No evidence of thrombus. Normal compressibility, respiratory phasicity and response to augmentation. Calf Veins: No evidence of thrombus. Normal compressibility and flow on color Doppler imaging. Superficial Great Saphenous Vein: No evidence of thrombus. Normal compressibility and flow on color Doppler imaging. Venous Reflux:  None. Other Findings:  None. IMPRESSION: No evidence of DVT within  either lower extremity. Electronically Signed   By: Sandi Mariscal M.D.   On: 03/27/2016 11:32   Dg Shoulder Left  03/26/2016  CLINICAL DATA:  Left shoulder pain for 2 days. EXAM: LEFT SHOULDER - 2+ VIEW COMPARISON:  None. FINDINGS: No fracture or dislocation. The alignment and joint spaces are maintained. No bony destructive change. Mild proliferative change at the acromioclavicular joint. Trace glenoid osteophytes. IMPRESSION: Mild degenerative change.  No acute bony abnormality. Electronically Signed   By: Jeb Levering M.D.   On: 03/26/2016 23:55    Lab Results: Basic Metabolic Panel:  Recent Labs  03/27/16 0339 03/28/16 0506  NA 135 136  K 4.3 4.3  CL 98* 97*  CO2 26 26  GLUCOSE 176* 137*  BUN 88* PENDING  CREATININE 2.04* 2.42*  CALCIUM 9.2 9.5   Liver Function Tests:  Recent Labs  03/26/16 1734  AST 18  ALT 22  ALKPHOS 44  BILITOT 0.6  PROT 7.6  ALBUMIN 3.7     CBC:  Recent Labs  03/26/16 1734 03/27/16 0339  WBC 8.8 8.1  NEUTROABS 6.0  --   HGB 12.1* 13.2  HCT 37.5* 41.1  MCV 92.4 92.2  PLT 156 184  Recent Results (from the past 240 hour(s))  MRSA PCR Screening     Status: None   Collection Time: 03/26/16  9:00 PM  Result Value Ref Range Status   MRSA by PCR NEGATIVE NEGATIVE Final    Comment:        The GeneXpert MRSA Assay (FDA approved for NASAL specimens only), is one component of a comprehensive MRSA colonization surveillance program. It is not intended to diagnose MRSA infection nor to guide or monitor treatment for MRSA infections.      Hospital Course: This is a 70 year old who came to the emergency department because of chest pain and shortness of breath. He has chronic renal failure, COPD and chronic heart failure. His troponin level is chronically elevated. His pain was felt to be atypical. He underwent ventilation/perfusion lung scan which did not show any evidence of clotting. His breathing improved to the point that he was  ready for discharge. His creatinine went up from 2.06-2.4 and this will be rechecked in 3 days. His diuretics have been adjusted.  Discharge Exam: Blood pressure 124/47, pulse 81, temperature 98.1 F (36.7 C), temperature source Oral, resp. rate 18, height 6\' 1"  (1.854 m), weight 110.678 kg (244 lb), SpO2 97 %. [name] awake and alert. His chest is clear. He looks comfortable.  Disposition: Home with home health services. He will hold Zaroxolyn for the next 3 days and will have basic metabolic profile on 0000000.      Discharge Instructions    Discharge patient    Complete by:  As directed      Face-to-face encounter (required for Medicare/Medicaid patients)    Complete by:  As directed   I Jun Osment L certify that this patient is under my care and that I, or a nurse practitioner or physician's assistant working with me, had a face-to-face encounter that meets the physician face-to-face encounter requirements with this patient on 03/28/2016. The encounter with the patient was in whole, or in part for the following medical condition(s) which is the primary reason for home health care (List medical condition): Atypical chest pain  The encounter with the patient was in whole, or in part, for the following medical condition, which is the primary reason for home health care:  Atypical chest pain  I certify that, based on my findings, the following services are medically necessary home health services:  Nursing  Reason for Medically Necessary Home Health Services:  Skilled Nursing- Change/Decline in Patient Status  My clinical findings support the need for the above services:  Shortness of breath with activity  Further, I certify that my clinical findings support that this patient is homebound due to:  Shortness of Breath with activity     Home Health    Complete by:  As directed   To provide the following care/treatments:  RN  Please do basic metabolic profile on 0000000              Signed: Teralyn Mullins L   03/28/2016, 8:57 AM

## 2016-03-28 NOTE — Progress Notes (Signed)
Discharged PT per MD order and protocol. Discharge handouts reviewed/explained. Education completed.  Pt verbalized understanding and left with all belongings. VSS. IV catheter D/C.  Patient wheeled down by staff member.  

## 2016-03-28 NOTE — Progress Notes (Signed)
This is a patient of Dr. Josephine Cables who was admitted with atypical chest pain and shortness of breath. He has ruled out for MI and his breathing is doing very well now. He has chronically elevated troponin level. He has chronic kidney disease. He has COPD and history of congestive heart failure. He had ventilation/perfusion lung scan which did not show pulmonary emboli  [name] awake and alert. He looks comfortable. Blood pressure 124/47 respirations 18 O2 sat 97%. His chest is clear. [name] ready for discharge. Discharge home today his creatinine is somewhat variable so he will need home health services and have another creatinine level checked in about 2 days

## 2016-03-28 NOTE — Care Management Important Message (Signed)
Important Message  Patient Details  Name: [name] [name] MRN: [id number] Date of Birth: 1946/02/06   Medicare Important Message Given:  N/A - LOS <3 / Initial given by admissions    Sherald Barge, RN 03/28/2016, 9:31 AM

## 2016-03-29 LAB — BASIC METABOLIC PANEL
Anion gap: 13 (ref 5–15)
CHLORIDE: 97 mmol/L — AB (ref 101–111)
CO2: 26 mmol/L (ref 22–32)
Calcium: 9.5 mg/dL (ref 8.9–10.3)
Creatinine, Ser: 2.42 mg/dL — ABNORMAL HIGH (ref 0.61–1.24)
GFR calc Af Amer: 29 mL/min — ABNORMAL LOW (ref >60)
GFR, EST NON AFRICAN AMERICAN: 25 mL/min — AB (ref >60)
GLUCOSE: 137 mg/dL — AB (ref 65–99)
Potassium: 4.3 mmol/L (ref 3.5–5.1)
Sodium: 136 mmol/L (ref 135–145)

## 2016-05-10 ENCOUNTER — Emergency Department (HOSPITAL_COMMUNITY)
Admission: EM | Admit: 2016-05-10 | Discharge: 2016-05-10 | Disposition: A | Discharge status: Home / Self Care | Payer: Medicare HMO | Attending: Emergency Medicine | Admitting: Emergency Medicine

## 2016-05-10 ENCOUNTER — Encounter (HOSPITAL_COMMUNITY): Payer: Self-pay | Admitting: Emergency Medicine

## 2016-05-10 ENCOUNTER — Emergency Department (HOSPITAL_COMMUNITY): Payer: Medicare HMO

## 2016-05-10 DIAGNOSIS — R05 Cough: Secondary | ICD-10-CM | POA: Diagnosis present

## 2016-05-10 DIAGNOSIS — E039 Hypothyroidism, unspecified: Secondary | ICD-10-CM | POA: Diagnosis not present

## 2016-05-10 DIAGNOSIS — E785 Hyperlipidemia, unspecified: Secondary | ICD-10-CM | POA: Diagnosis not present

## 2016-05-10 DIAGNOSIS — E1122 Type 2 diabetes mellitus with diabetic chronic kidney disease: Secondary | ICD-10-CM | POA: Diagnosis not present

## 2016-05-10 DIAGNOSIS — Z7982 Long term (current) use of aspirin: Secondary | ICD-10-CM | POA: Insufficient documentation

## 2016-05-10 DIAGNOSIS — J441 Chronic obstructive pulmonary disease with (acute) exacerbation: Secondary | ICD-10-CM | POA: Insufficient documentation

## 2016-05-10 DIAGNOSIS — Z87891 Personal history of nicotine dependence: Secondary | ICD-10-CM | POA: Diagnosis not present

## 2016-05-10 DIAGNOSIS — I110 Hypertensive heart disease with heart failure: Secondary | ICD-10-CM | POA: Insufficient documentation

## 2016-05-10 DIAGNOSIS — Z79899 Other long term (current) drug therapy: Secondary | ICD-10-CM | POA: Insufficient documentation

## 2016-05-10 DIAGNOSIS — N184 Chronic kidney disease, stage 4 (severe): Secondary | ICD-10-CM | POA: Diagnosis not present

## 2016-05-10 DIAGNOSIS — I5022 Chronic systolic (congestive) heart failure: Secondary | ICD-10-CM | POA: Insufficient documentation

## 2016-05-10 LAB — CBC WITH DIFFERENTIAL/PLATELET
Basophils Absolute: 0.1 10*3/uL (ref 0.0–0.1)
Basophils Relative: 1 %
EOS PCT: 15 %
Eosinophils Absolute: 1.2 10*3/uL — ABNORMAL HIGH (ref 0.0–0.7)
HCT: 41 % (ref 39.0–52.0)
Hemoglobin: 13.2 g/dL (ref 13.0–17.0)
LYMPHS ABS: 1 10*3/uL (ref 0.7–4.0)
LYMPHS PCT: 12 %
MCH: 29.9 pg (ref 26.0–34.0)
MCHC: 32.2 g/dL (ref 30.0–36.0)
MCV: 93 fL (ref 78.0–100.0)
MONO ABS: 0.9 10*3/uL (ref 0.1–1.0)
Monocytes Relative: 10 %
Neutro Abs: 5.2 10*3/uL (ref 1.7–7.7)
Neutrophils Relative %: 62 %
PLATELETS: 147 10*3/uL — AB (ref 150–400)
RBC: 4.41 MIL/uL (ref 4.22–5.81)
RDW: 15.1 % (ref 11.5–15.5)
WBC: 8.4 10*3/uL (ref 4.0–10.5)

## 2016-05-10 LAB — BASIC METABOLIC PANEL
Anion gap: 11 (ref 5–15)
BUN: 95 mg/dL — AB (ref 6–20)
CO2: 33 mmol/L — ABNORMAL HIGH (ref 22–32)
Calcium: 9.1 mg/dL (ref 8.9–10.3)
Chloride: 94 mmol/L — ABNORMAL LOW (ref 101–111)
Creatinine, Ser: 2.64 mg/dL — ABNORMAL HIGH (ref 0.61–1.24)
GFR calc Af Amer: 26 mL/min — ABNORMAL LOW (ref >60)
GFR, EST NON AFRICAN AMERICAN: 23 mL/min — AB (ref >60)
GLUCOSE: 100 mg/dL — AB (ref 65–99)
POTASSIUM: 3 mmol/L — AB (ref 3.5–5.1)
Sodium: 138 mmol/L (ref 135–145)

## 2016-05-10 LAB — TROPONIN I: TROPONIN I: 0.1 ng/mL — AB (ref <0.031)

## 2016-05-10 LAB — BRAIN NATRIURETIC PEPTIDE: B Natriuretic Peptide: 112 pg/mL — ABNORMAL HIGH (ref 0.0–100.0)

## 2016-05-10 MED ORDER — DOXYCYCLINE HYCLATE 100 MG PO CAPS: 100.0000 mg | ORAL_CAPSULE | Freq: Two times a day (BID) | ORAL | Status: DC

## 2016-05-10 MED ORDER — ALBUTEROL SULFATE HFA 108 (90 BASE) MCG/ACT IN AERS: 2.0000 | INHALATION_SPRAY | RESPIRATORY_TRACT | Status: DC | PRN

## 2016-05-10 MED ORDER — ALBUTEROL (5 MG/ML) CONTINUOUS INHALATION SOLN
10.0000 mg/h | INHALATION_SOLUTION | RESPIRATORY_TRACT | Status: AC
  Administered 2016-05-10: 10 mg/h via RESPIRATORY_TRACT
  Filled 2016-05-10: qty 20

## 2016-05-10 MED ORDER — PREDNISONE 20 MG PO TABS: 40.0000 mg | ORAL_TABLET | Freq: Every day | ORAL | Status: DC

## 2016-05-10 MED ORDER — ALBUTEROL SULFATE (2.5 MG/3ML) 0.083% IN NEBU
2.5000 mg | INHALATION_SOLUTION | Freq: Once | RESPIRATORY_TRACT | Status: DC
Start: 2016-05-10 — End: 2016-05-10

## 2016-05-10 MED ORDER — IPRATROPIUM-ALBUTEROL 0.5-2.5 (3) MG/3ML IN SOLN: 3.0000 mL | Freq: Once | RESPIRATORY_TRACT | Status: DC

## 2016-05-10 MED ORDER — METHYLPREDNISOLONE SODIUM SUCC 125 MG IJ SOLR
125.0000 mg | Freq: Once | INTRAMUSCULAR | Status: AC
  Administered 2016-05-10: 125 mg via INTRAVENOUS
  Filled 2016-05-10: qty 2

## 2016-05-10 MED ORDER — IPRATROPIUM BROMIDE 0.02 % IN SOLN
0.5000 mg | RESPIRATORY_TRACT | Status: AC
  Administered 2016-05-10: 0.5 mg via RESPIRATORY_TRACT
  Filled 2016-05-10: qty 2.5

## 2016-05-10 NOTE — ED Notes (Signed)
Pt tolerated ambulation with o2, o2 stat dropped to 90.

## 2016-05-10 NOTE — Discharge Instructions (Signed)
#  1 albuterol every 4 hours as needed for shortness of breath or wheezing, please use every 4 hours for the first 24 hours, then every 4 hours as needed  #2 prednisone once daily for the next 5 days  #3 doxycycline twice a day for the next 10 days  #4 please return to the emergency department for severe or worsening symptoms, otherwise call your doctor for an appointment on Monday. He will be watched [birth year] year old last night.  Milligrams total she woke up nauseated and vomiting throughout the day is 120 there is) she is is interacting.attshare But THERE is appropriate with atrial premature fluids and nausea control she is outside the window observation patches applied I walked back centimeters and Zofran she is given a liter this is months as mom also 50 mg  Please obtain all of your results from medical records or have your doctors office obtain the results - share them with your doctor - you should be seen at your doctors office in the next 2 days. Call today to arrange your follow up. Take the medications as prescribed. Please review all of the medicines and only take them if you do not have an allergy to them. Please be aware that if you are taking birth control pills, taking other prescriptions, ESPECIALLY ANTIBIOTICS may make the birth control ineffective - if this is the case, either do not engage in sexual activity or use alternative methods of birth control such as condoms until you have finished the medicine and your family doctor says it is OK to restart them. If you are on a blood thinner such as COUMADIN, be aware that any other medicine that you take may cause the coumadin to either work too much, or not enough - you should have your coumadin level rechecked in next 7 days if this is the case.  ?  It is also a possibility that you have an allergic reaction to any of the medicines that you have been prescribed - Everybody reacts differently to medications and while MOST people have no  trouble with most medicines, you may have a reaction such as nausea, vomiting, rash, swelling, shortness of breath. If this is the case, please stop taking the medicine immediately and contact your physician.  ?  You should return to the ER if you develop severe or worsening symptoms.
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
## 2016-05-10 NOTE — ED Notes (Signed)
Patient c/o cough with wheezing and shortness of breath. Per patient using nebulizer and inhaler with no relief. Unsure of any fevers. Denies any chest. Patient reports cough occasionally productive with thick white sputum. Patient has COPD in which he uses 2 liters of oxygen via [facility] at home as needed.

## 2016-05-13 ENCOUNTER — Other Ambulatory Visit: Payer: Self-pay | Admitting: "Endocrinology

## 2016-05-13 ENCOUNTER — Other Ambulatory Visit: Payer: Self-pay | Admitting: Cardiovascular Disease

## 2016-06-11 ENCOUNTER — Inpatient Hospital Stay (HOSPITAL_COMMUNITY)
Admission: EM | Admit: 2016-06-11 | Discharge: 2016-06-17 | DRG: 291 | Disposition: A | Discharge status: Home / Self Care | Payer: Medicare HMO | Attending: Internal Medicine | Admitting: Internal Medicine

## 2016-06-11 ENCOUNTER — Encounter (HOSPITAL_COMMUNITY): Payer: Self-pay

## 2016-06-11 ENCOUNTER — Emergency Department (HOSPITAL_COMMUNITY): Payer: Medicare HMO

## 2016-06-11 DIAGNOSIS — I130 Hypertensive heart and chronic kidney disease with heart failure and stage 1 through stage 4 chronic kidney disease, or unspecified chronic kidney disease: Principal | ICD-10-CM | POA: Diagnosis present

## 2016-06-11 DIAGNOSIS — I248 Other forms of acute ischemic heart disease: Secondary | ICD-10-CM | POA: Diagnosis present

## 2016-06-11 DIAGNOSIS — E1122 Type 2 diabetes mellitus with diabetic chronic kidney disease: Secondary | ICD-10-CM | POA: Diagnosis present

## 2016-06-11 DIAGNOSIS — Z87891 Personal history of nicotine dependence: Secondary | ICD-10-CM

## 2016-06-11 DIAGNOSIS — E230 Hypopituitarism: Secondary | ICD-10-CM | POA: Diagnosis present

## 2016-06-11 DIAGNOSIS — Z85528 Personal history of other malignant neoplasm of kidney: Secondary | ICD-10-CM

## 2016-06-11 DIAGNOSIS — I5043 Acute on chronic combined systolic (congestive) and diastolic (congestive) heart failure: Secondary | ICD-10-CM | POA: Diagnosis present

## 2016-06-11 DIAGNOSIS — I351 Nonrheumatic aortic (valve) insufficiency: Secondary | ICD-10-CM | POA: Diagnosis present

## 2016-06-11 DIAGNOSIS — Z905 Acquired absence of kidney: Secondary | ICD-10-CM | POA: Diagnosis not present

## 2016-06-11 DIAGNOSIS — Z9981 Dependence on supplemental oxygen: Secondary | ICD-10-CM

## 2016-06-11 DIAGNOSIS — Z7982 Long term (current) use of aspirin: Secondary | ICD-10-CM

## 2016-06-11 DIAGNOSIS — I509 Heart failure, unspecified: Secondary | ICD-10-CM

## 2016-06-11 DIAGNOSIS — E785 Hyperlipidemia, unspecified: Secondary | ICD-10-CM | POA: Diagnosis present

## 2016-06-11 DIAGNOSIS — I2510 Atherosclerotic heart disease of native coronary artery without angina pectoris: Secondary | ICD-10-CM | POA: Diagnosis present

## 2016-06-11 DIAGNOSIS — Z9111 Patient's noncompliance with dietary regimen: Secondary | ICD-10-CM | POA: Diagnosis not present

## 2016-06-11 DIAGNOSIS — E038 Other specified hypothyroidism: Secondary | ICD-10-CM

## 2016-06-11 DIAGNOSIS — J441 Chronic obstructive pulmonary disease with (acute) exacerbation: Secondary | ICD-10-CM

## 2016-06-11 DIAGNOSIS — I5033 Acute on chronic diastolic (congestive) heart failure: Secondary | ICD-10-CM

## 2016-06-11 DIAGNOSIS — G4730 Sleep apnea, unspecified: Secondary | ICD-10-CM | POA: Diagnosis present

## 2016-06-11 DIAGNOSIS — N289 Disorder of kidney and ureter, unspecified: Secondary | ICD-10-CM | POA: Diagnosis present

## 2016-06-11 DIAGNOSIS — G40909 Epilepsy, unspecified, not intractable, without status epilepticus: Secondary | ICD-10-CM | POA: Diagnosis present

## 2016-06-11 DIAGNOSIS — Z79899 Other long term (current) drug therapy: Secondary | ICD-10-CM | POA: Diagnosis not present

## 2016-06-11 DIAGNOSIS — J9620 Acute and chronic respiratory failure, unspecified whether with hypoxia or hypercapnia: Secondary | ICD-10-CM | POA: Diagnosis present

## 2016-06-11 DIAGNOSIS — Z8249 Family history of ischemic heart disease and other diseases of the circulatory system: Secondary | ICD-10-CM

## 2016-06-11 DIAGNOSIS — N184 Chronic kidney disease, stage 4 (severe): Secondary | ICD-10-CM | POA: Diagnosis present

## 2016-06-11 DIAGNOSIS — I10 Essential (primary) hypertension: Secondary | ICD-10-CM | POA: Diagnosis present

## 2016-06-11 DIAGNOSIS — Z7951 Long term (current) use of inhaled steroids: Secondary | ICD-10-CM

## 2016-06-11 DIAGNOSIS — R7989 Other specified abnormal findings of blood chemistry: Secondary | ICD-10-CM | POA: Diagnosis not present

## 2016-06-11 DIAGNOSIS — M25512 Pain in left shoulder: Secondary | ICD-10-CM | POA: Diagnosis present

## 2016-06-11 DIAGNOSIS — Z9119 Patient's noncompliance with other medical treatment and regimen: Secondary | ICD-10-CM | POA: Diagnosis not present

## 2016-06-11 DIAGNOSIS — D72829 Elevated white blood cell count, unspecified: Secondary | ICD-10-CM | POA: Diagnosis present

## 2016-06-11 DIAGNOSIS — Z6834 Body mass index (BMI) 34.0-34.9, adult: Secondary | ICD-10-CM

## 2016-06-11 DIAGNOSIS — Z809 Family history of malignant neoplasm, unspecified: Secondary | ICD-10-CM | POA: Diagnosis not present

## 2016-06-11 DIAGNOSIS — I272 Other secondary pulmonary hypertension: Secondary | ICD-10-CM | POA: Diagnosis present

## 2016-06-11 DIAGNOSIS — R778 Other specified abnormalities of plasma proteins: Secondary | ICD-10-CM | POA: Diagnosis present

## 2016-06-11 DIAGNOSIS — E039 Hypothyroidism, unspecified: Secondary | ICD-10-CM | POA: Diagnosis present

## 2016-06-11 DIAGNOSIS — E119 Type 2 diabetes mellitus without complications: Secondary | ICD-10-CM

## 2016-06-11 LAB — COMPREHENSIVE METABOLIC PANEL
ALT: 26 U/L (ref 17–63)
ANION GAP: 8 (ref 5–15)
AST: 21 U/L (ref 15–41)
Albumin: 4 g/dL (ref 3.5–5.0)
Alkaline Phosphatase: 46 U/L (ref 38–126)
BILIRUBIN TOTAL: 0.7 mg/dL (ref 0.3–1.2)
BUN: 75 mg/dL — AB (ref 6–20)
CHLORIDE: 101 mmol/L (ref 101–111)
CO2: 31 mmol/L (ref 22–32)
Calcium: 8.7 mg/dL — ABNORMAL LOW (ref 8.9–10.3)
Creatinine, Ser: 2.8 mg/dL — ABNORMAL HIGH (ref 0.61–1.24)
GFR, EST AFRICAN AMERICAN: 25 mL/min — AB (ref >60)
GFR, EST NON AFRICAN AMERICAN: 21 mL/min — AB (ref >60)
Glucose, Bld: 111 mg/dL — ABNORMAL HIGH (ref 65–99)
Potassium: 3.8 mmol/L (ref 3.5–5.1)
Sodium: 140 mmol/L (ref 135–145)
TOTAL PROTEIN: 8 g/dL (ref 6.5–8.1)

## 2016-06-11 LAB — GLUCOSE, CAPILLARY
GLUCOSE-CAPILLARY: 113 mg/dL — AB (ref 65–99)
GLUCOSE-CAPILLARY: 235 mg/dL — AB (ref 65–99)
Glucose-Capillary: 158 mg/dL — ABNORMAL HIGH (ref 65–99)
Glucose-Capillary: 186 mg/dL — ABNORMAL HIGH (ref 65–99)
Glucose-Capillary: 216 mg/dL — ABNORMAL HIGH (ref 65–99)

## 2016-06-11 LAB — CBC WITH DIFFERENTIAL/PLATELET
BASOS ABS: 0.1 10*3/uL (ref 0.0–0.1)
Basophils Relative: 0 %
EOS PCT: 6 %
Eosinophils Absolute: 0.8 10*3/uL — ABNORMAL HIGH (ref 0.0–0.7)
HCT: 45.5 % (ref 39.0–52.0)
HEMOGLOBIN: 14.8 g/dL (ref 13.0–17.0)
LYMPHS ABS: 2.1 10*3/uL (ref 0.7–4.0)
LYMPHS PCT: 16 %
MCH: 31.2 pg (ref 26.0–34.0)
MCHC: 32.5 g/dL (ref 30.0–36.0)
MCV: 95.8 fL (ref 78.0–100.0)
Monocytes Absolute: 1 10*3/uL (ref 0.1–1.0)
Monocytes Relative: 8 %
NEUTROS ABS: 9.6 10*3/uL — AB (ref 1.7–7.7)
NEUTROS PCT: 70 %
PLATELETS: 145 10*3/uL — AB (ref 150–400)
RBC: 4.75 MIL/uL (ref 4.22–5.81)
RDW: 16.1 % — ABNORMAL HIGH (ref 11.5–15.5)
WBC: 13.7 10*3/uL — AB (ref 4.0–10.5)

## 2016-06-11 LAB — TROPONIN I: Troponin I: 0.08 ng/mL (ref <0.03)

## 2016-06-11 LAB — TSH: TSH: 0.108 u[IU]/mL — AB (ref 0.350–4.500)

## 2016-06-11 LAB — BRAIN NATRIURETIC PEPTIDE: B Natriuretic Peptide: 682 pg/mL — ABNORMAL HIGH (ref 0.0–100.0)

## 2016-06-11 LAB — MRSA PCR SCREENING: MRSA by PCR: NEGATIVE

## 2016-06-11 MED ORDER — INSULIN ASPART 100 UNIT/ML ~~LOC~~ SOLN
0.0000 [IU] | SUBCUTANEOUS | Status: DC
  Administered 2016-06-11: 4 [IU] via SUBCUTANEOUS
  Administered 2016-06-11 (×2): 7 [IU] via SUBCUTANEOUS
  Administered 2016-06-12 (×4): 4 [IU] via SUBCUTANEOUS
  Administered 2016-06-13: 3 [IU] via SUBCUTANEOUS
  Administered 2016-06-13: 4 [IU] via SUBCUTANEOUS
  Administered 2016-06-13: 7 [IU] via SUBCUTANEOUS
  Administered 2016-06-13: 4 [IU] via SUBCUTANEOUS
  Administered 2016-06-13: 3 [IU] via SUBCUTANEOUS
  Administered 2016-06-14 (×2): 4 [IU] via SUBCUTANEOUS
  Administered 2016-06-14 – 2016-06-16 (×5): 3 [IU] via SUBCUTANEOUS
  Administered 2016-06-16: 4 [IU] via SUBCUTANEOUS
  Administered 2016-06-17: 3 [IU] via SUBCUTANEOUS

## 2016-06-11 MED ORDER — METHYLPREDNISOLONE SODIUM SUCC 125 MG IJ SOLR
125.0000 mg | Freq: Once | INTRAMUSCULAR | Status: AC
  Administered 2016-06-11: 125 mg via INTRAVENOUS
  Filled 2016-06-11: qty 2

## 2016-06-11 MED ORDER — ALBUTEROL SULFATE (2.5 MG/3ML) 0.083% IN NEBU: 5.0000 mg | INHALATION_SOLUTION | RESPIRATORY_TRACT | Status: DC | PRN

## 2016-06-11 MED ORDER — SALINE SPRAY 0.65 % NA SOLN
1.0000 | NASAL | Status: DC | PRN
  Administered 2016-06-11 – 2016-06-13 (×3): 1 via NASAL
  Filled 2016-06-11: qty 44

## 2016-06-11 MED ORDER — NITROGLYCERIN 2 % TD OINT
1.0000 [in_us] | TOPICAL_OINTMENT | Freq: Once | TRANSDERMAL | Status: AC
  Administered 2016-06-11: 1 [in_us] via TOPICAL
  Filled 2016-06-11: qty 1

## 2016-06-11 MED ORDER — VITAMIN D (ERGOCALCIFEROL) 1.25 MG (50000 UNIT) PO CAPS
50000.0000 [IU] | ORAL_CAPSULE | ORAL | Status: DC
Start: 2016-06-11 — End: 2016-06-11

## 2016-06-11 MED ORDER — METHYLPREDNISOLONE SODIUM SUCC 40 MG IJ SOLR
40.0000 mg | Freq: Four times a day (QID) | INTRAMUSCULAR | Status: DC
  Administered 2016-06-11 – 2016-06-14 (×13): 40 mg via INTRAVENOUS
  Filled 2016-06-11 (×13): qty 1

## 2016-06-11 MED ORDER — CALCITRIOL 0.25 MCG PO CAPS
0.5000 ug | ORAL_CAPSULE | Freq: Two times a day (BID) | ORAL | Status: DC
  Administered 2016-06-11 – 2016-06-17 (×13): 0.5 ug via ORAL
  Filled 2016-06-11 (×13): qty 2

## 2016-06-11 MED ORDER — FUROSEMIDE 10 MG/ML IJ SOLN
60.0000 mg | Freq: Once | INTRAMUSCULAR | Status: AC
  Administered 2016-06-11: 60 mg via INTRAVENOUS
  Filled 2016-06-11: qty 6

## 2016-06-11 MED ORDER — CLONIDINE HCL 0.2 MG PO TABS
0.2000 mg | ORAL_TABLET | Freq: Three times a day (TID) | ORAL | Status: DC
  Administered 2016-06-11 – 2016-06-17 (×19): 0.2 mg via ORAL
  Filled 2016-06-11 (×19): qty 1

## 2016-06-11 MED ORDER — LEVOTHYROXINE SODIUM 100 MCG PO TABS
175.0000 ug | ORAL_TABLET | Freq: Every day | ORAL | Status: DC
  Administered 2016-06-11 – 2016-06-17 (×7): 175 ug via ORAL
  Filled 2016-06-11 (×7): qty 1

## 2016-06-11 MED ORDER — METHOCARBAMOL 500 MG PO TABS
750.0000 mg | ORAL_TABLET | Freq: Once | ORAL | Status: AC
  Administered 2016-06-11: 750 mg via ORAL
  Filled 2016-06-11: qty 2

## 2016-06-11 MED ORDER — VITAMIN D (ERGOCALCIFEROL) 1.25 MG (50000 UNIT) PO CAPS
50000.0000 [IU] | ORAL_CAPSULE | ORAL | Status: DC
  Administered 2016-06-13: 50000 [IU] via ORAL
  Filled 2016-06-11: qty 1

## 2016-06-11 MED ORDER — SODIUM CHLORIDE 0.9% FLUSH
3.0000 mL | Freq: Two times a day (BID) | INTRAVENOUS | Status: DC
  Administered 2016-06-11 – 2016-06-17 (×10): 3 mL via INTRAVENOUS

## 2016-06-11 MED ORDER — ASPIRIN EC 81 MG PO TBEC
81.0000 mg | DELAYED_RELEASE_TABLET | Freq: Every day | ORAL | Status: DC
  Administered 2016-06-11 – 2016-06-17 (×7): 81 mg via ORAL
  Filled 2016-06-11 (×7): qty 1

## 2016-06-11 MED ORDER — FUROSEMIDE 10 MG/ML IJ SOLN
40.0000 mg | Freq: Two times a day (BID) | INTRAMUSCULAR | Status: DC
  Administered 2016-06-11 – 2016-06-12 (×3): 40 mg via INTRAVENOUS
  Filled 2016-06-11 (×3): qty 4

## 2016-06-11 MED ORDER — LORATADINE 10 MG PO TABS
10.0000 mg | ORAL_TABLET | Freq: Every day | ORAL | Status: DC
  Administered 2016-06-11 – 2016-06-17 (×7): 10 mg via ORAL
  Filled 2016-06-11 (×7): qty 1

## 2016-06-11 MED ORDER — PANTOPRAZOLE SODIUM 40 MG PO TBEC
40.0000 mg | DELAYED_RELEASE_TABLET | Freq: Two times a day (BID) | ORAL | Status: DC
  Administered 2016-06-11 – 2016-06-17 (×13): 40 mg via ORAL
  Filled 2016-06-11 (×13): qty 1

## 2016-06-11 MED ORDER — ALBUTEROL SULFATE (2.5 MG/3ML) 0.083% IN NEBU
2.5000 mg | INHALATION_SOLUTION | Freq: Four times a day (QID) | RESPIRATORY_TRACT | Status: DC
  Administered 2016-06-11 – 2016-06-16 (×20): 2.5 mg via RESPIRATORY_TRACT
  Filled 2016-06-11 (×21): qty 3

## 2016-06-11 MED ORDER — PHENYTOIN SODIUM EXTENDED 100 MG PO CAPS: 200.0000 mg | ORAL_CAPSULE | Freq: Every day | ORAL | Status: DC

## 2016-06-11 MED ORDER — SIMVASTATIN 20 MG PO TABS
20.0000 mg | ORAL_TABLET | Freq: Every day | ORAL | Status: DC
  Administered 2016-06-11 – 2016-06-17 (×7): 20 mg via ORAL
  Filled 2016-06-11 (×9): qty 1

## 2016-06-11 MED ORDER — MOMETASONE FURO-FORMOTEROL FUM 200-5 MCG/ACT IN AERO
2.0000 | INHALATION_SPRAY | Freq: Two times a day (BID) | RESPIRATORY_TRACT | Status: DC
  Administered 2016-06-11 – 2016-06-17 (×13): 2 via RESPIRATORY_TRACT
  Filled 2016-06-11: qty 8.8

## 2016-06-11 MED ORDER — FUROSEMIDE 10 MG/ML IJ SOLN: 60.0000 mg | Freq: Once | INTRAMUSCULAR | Status: DC

## 2016-06-11 MED ORDER — POLYETHYLENE GLYCOL 3350 17 G PO PACK
17.0000 g | PACK | Freq: Every day | ORAL | Status: DC
  Administered 2016-06-11 – 2016-06-15 (×5): 17 g via ORAL
  Filled 2016-06-11 (×7): qty 1

## 2016-06-11 MED ORDER — ACETAMINOPHEN 325 MG PO TABS
650.0000 mg | ORAL_TABLET | Freq: Four times a day (QID) | ORAL | Status: DC | PRN
Start: 2016-06-11 — End: 2016-06-17
  Administered 2016-06-11 – 2016-06-13 (×2): 650 mg via ORAL
  Filled 2016-06-11 (×2): qty 2

## 2016-06-11 MED ORDER — AMLODIPINE BESYLATE 5 MG PO TABS
5.0000 mg | ORAL_TABLET | Freq: Every day | ORAL | Status: DC
  Administered 2016-06-11 – 2016-06-12 (×2): 5 mg via ORAL
  Filled 2016-06-11 (×2): qty 1

## 2016-06-11 MED ORDER — DESMOPRESSIN ACETATE 0.2 MG PO TABS
0.2000 mg | ORAL_TABLET | Freq: Two times a day (BID) | ORAL | Status: DC
  Administered 2016-06-11 – 2016-06-17 (×12): 0.2 mg via ORAL
  Filled 2016-06-11 (×15): qty 1

## 2016-06-11 MED ORDER — PHENYTOIN SODIUM EXTENDED 100 MG PO CAPS
300.0000 mg | ORAL_CAPSULE | ORAL | Status: DC
  Administered 2016-06-11 – 2016-06-17 (×5): 300 mg via ORAL
  Filled 2016-06-11 (×4): qty 3

## 2016-06-11 MED ORDER — ALBUTEROL SULFATE (2.5 MG/3ML) 0.083% IN NEBU
2.5000 mg | INHALATION_SOLUTION | RESPIRATORY_TRACT | Status: DC | PRN
  Administered 2016-06-13: 2.5 mg via RESPIRATORY_TRACT

## 2016-06-11 MED ORDER — HEPARIN SODIUM (PORCINE) 5000 UNIT/ML IJ SOLN
5000.0000 [IU] | Freq: Three times a day (TID) | INTRAMUSCULAR | Status: DC
  Administered 2016-06-11 – 2016-06-17 (×19): 5000 [IU] via SUBCUTANEOUS
  Filled 2016-06-11 (×19): qty 1

## 2016-06-11 MED ORDER — ALBUTEROL (5 MG/ML) CONTINUOUS INHALATION SOLN
15.0000 mg/h | INHALATION_SOLUTION | Freq: Once | RESPIRATORY_TRACT | Status: AC
  Administered 2016-06-11: 15 mg/h via RESPIRATORY_TRACT
  Filled 2016-06-11: qty 20

## 2016-06-11 MED ORDER — IPRATROPIUM BROMIDE 0.02 % IN SOLN
0.5000 mg | Freq: Once | RESPIRATORY_TRACT | Status: AC
  Administered 2016-06-11: 0.5 mg via RESPIRATORY_TRACT
  Filled 2016-06-11: qty 2.5

## 2016-06-11 MED ORDER — PHENYTOIN SODIUM EXTENDED 100 MG PO CAPS
200.0000 mg | ORAL_CAPSULE | ORAL | Status: DC
  Administered 2016-06-12 – 2016-06-16 (×2): 200 mg via ORAL
  Filled 2016-06-11 (×3): qty 2
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
## 2016-06-11 NOTE — Care Management Note (Signed)
Case Management Note  Patient Details  Name: OLA LEEDER MRN: [id number] Date of Birth: 18-Feb-1946  Subjective/Objective:                  Pt admitted with CHF. Pt is from home, lives alone and is ind with ADL's at baseline. Pt was recently DC'd from nursing services through [facility]. Pt has walker, and home O2 through [facility]. Pt has CPAP through [place] but admits he does not use it as he should. Pt plans to return home. Pt states he does not want HH at DC.   Action/Plan: Anticipate pt will DC home with self care. Will cont to follow for DC planning needs.   Expected Discharge Date:  06/14/16               Expected Discharge Plan:  Home with self care  In-House Referral:  NA  Discharge planning Services  CM Consult  Post Acute Care Choice:  N /A Choice offered to:  N/A  DME Arranged:    DME Agency:     HH Arranged:   HH Agency:    Status of Service:  In process, will continue to follow  If discussed at Long Length of Stay Meetings, dates discussed:    Additional Comments:  Sherald Barge, RN 06/11/2016, 2:51 PM
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
## 2016-06-11 NOTE — ED Notes (Addendum)
CRITICAL VALUE ALERT  Critical value received: Troponin 0.08  Date of notification:  06/11/2016  Time of notification:  [id number]  Critical value read back:Yes.    Nurse who received alert:  Fabio Neighbors RN  MD notified (1st page):  Dr. Tomi Bamberger  Time of first page:  [phone]

## 2016-06-11 NOTE — ED Notes (Signed)
Sob onset this am, denies cp

## 2016-06-11 NOTE — Progress Notes (Signed)
RT took patient off bipap to see how his breathing was off machine.  No SOB/labored breathing, placed patient on 2 LPM [place].  Will continue to monitor.

## 2016-06-11 NOTE — Care Management Important Message (Signed)
Important Message  Patient Details  Name: MATSON DONINI MRN: [id number] Date of Birth: 07-Jan-1946   Medicare Important Message Given:  Yes    Sherald Barge, RN 06/11/2016, 2:50 PM

## 2016-06-11 NOTE — Progress Notes (Signed)
70 year old male with a history of COPD, congestive heart failure came to the hospital with worsening shortness of breath. In the ED patient requiring BiPAP. Chest x-ray showed pulmonary edema, BNP elevated 682. Lasix 60 mg IV given in the ED. He doesn't history of chronically elevated troponin. Patient to be admitted to stepdown unit

## 2016-06-11 NOTE — Progress Notes (Signed)
eLink Physician-Brief Progress Note Patient Name: [name] [name] DOB: 1946-05-05 MRN: [id number]   Date of Service  06/11/2016  HPI/Events of Note  Patient c/o of L shoulder pain. AST and ALT normal.   eICU Interventions  Will order: 1. Tylenol 650 mg PO Q 6 hours PRN pain.      Intervention Category Intermediate Interventions: Pain - evaluation and management  [place] Sorn Eugene 06/11/2016, 8:01 PM

## 2016-06-12 DIAGNOSIS — I5043 Acute on chronic combined systolic (congestive) and diastolic (congestive) heart failure: Secondary | ICD-10-CM

## 2016-06-12 DIAGNOSIS — I351 Nonrheumatic aortic (valve) insufficiency: Secondary | ICD-10-CM

## 2016-06-12 DIAGNOSIS — R7989 Other specified abnormal findings of blood chemistry: Secondary | ICD-10-CM

## 2016-06-12 LAB — GLUCOSE, CAPILLARY
GLUCOSE-CAPILLARY: 175 mg/dL — AB (ref 65–99)
GLUCOSE-CAPILLARY: 154 mg/dL — AB (ref 65–99)
GLUCOSE-CAPILLARY: 115 mg/dL — AB (ref 65–99)
Glucose-Capillary: 100 mg/dL — ABNORMAL HIGH (ref 65–99)
Glucose-Capillary: 175 mg/dL — ABNORMAL HIGH (ref 65–99)

## 2016-06-12 LAB — BASIC METABOLIC PANEL
Anion gap: 8 (ref 5–15)
BUN: 74 mg/dL — AB (ref 6–20)
CO2: 27 mmol/L (ref 22–32)
Calcium: 8.7 mg/dL — ABNORMAL LOW (ref 8.9–10.3)
Chloride: 101 mmol/L (ref 101–111)
Creatinine, Ser: 2.44 mg/dL — ABNORMAL HIGH (ref 0.61–1.24)
GFR calc Af Amer: 29 mL/min — ABNORMAL LOW (ref >60)
GFR, EST NON AFRICAN AMERICAN: 25 mL/min — AB (ref >60)
Glucose, Bld: 145 mg/dL — ABNORMAL HIGH (ref 65–99)
POTASSIUM: 4.9 mmol/L (ref 3.5–5.1)
SODIUM: 136 mmol/L (ref 135–145)

## 2016-06-12 LAB — T4, FREE: Free T4: 0.68 ng/dL (ref 0.61–1.12)

## 2016-06-12 LAB — TROPONIN I: Troponin I: 0.07 ng/mL (ref <0.03)

## 2016-06-12 LAB — HEMOGLOBIN A1C
Hgb A1c MFr Bld: 6.2 % — ABNORMAL HIGH (ref 4.8–5.6)
MEAN PLASMA GLUCOSE: 131 mg/dL

## 2016-06-12 MED ORDER — FUROSEMIDE 10 MG/ML IJ SOLN
60.0000 mg | Freq: Two times a day (BID) | INTRAMUSCULAR | Status: DC
  Administered 2016-06-12 – 2016-06-13 (×2): 60 mg via INTRAVENOUS
  Filled 2016-06-12 (×2): qty 6

## 2016-06-12 MED ORDER — DOXYCYCLINE HYCLATE 100 MG PO TABS
100.0000 mg | ORAL_TABLET | Freq: Two times a day (BID) | ORAL | Status: DC
  Administered 2016-06-12 – 2016-06-17 (×11): 100 mg via ORAL
  Filled 2016-06-12 (×11): qty 1

## 2016-06-12 NOTE — Progress Notes (Signed)
Placed consult with pulmonary and cardiology per order. Called and verified with both.
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
## 2016-06-12 NOTE — Progress Notes (Signed)
Subjective: This is a 70 years old male with history of multiple medical illnesses including CHF and COPD was readmitted due to worsening shortness of breath. Patient had slight elevation of Troponin but no history of chest pain. Patient was started on Iv steroid and BIPAP on admission. Patient feels better this morning. His breathing is improving.  Objective: Vital signs in last 24 hours: Temp:  [97.5 F (36.4 C)-98.9 F (37.2 C)] 97.9 F (36.6 C) (07/06 0751) Pulse Rate:  [75-94] 79 (07/06 0600) Resp:  [11-29] 11 (07/06 0600) BP: (122-152)/(48-84) 128/58 mmHg (07/06 0600) SpO2:  [95 %-100 %] 99 % (07/06 0753) Weight change:  Last BM Date: 06/10/16  Intake/Output from previous day: 07/05 0701 - 07/06 0700 In: 840 [P.O.:840] Out: 2051 [Urine:2050; Stool:1]  PHYSICAL EXAM General appearance: alert, no distress and moderately obese Resp: diminished breath sounds bilaterally and rhonchi bilaterally Cardio: S1, S2 normal GI: soft, non-tender; bowel sounds normal; no masses,  no organomegaly Extremities: edema 2++ leg edema  Lab Results:  Results for orders placed or performed during the hospital encounter of 06/11/16 (from the past 48 hour(s))  Comprehensive metabolic panel     Status: Abnormal   Collection Time: 06/11/16  5:08 AM  Result Value Ref Range   Sodium 140 135 - 145 mmol/L   Potassium 3.8 3.5 - 5.1 mmol/L   Chloride 101 101 - 111 mmol/L   CO2 31 22 - 32 mmol/L   Glucose, Bld 111 (H) 65 - 99 mg/dL   BUN 75 (H) 6 - 20 mg/dL   Creatinine, Ser 2.80 (H) 0.61 - 1.24 mg/dL   Calcium 8.7 (L) 8.9 - 10.3 mg/dL   Total Protein 8.0 6.5 - 8.1 g/dL   Albumin 4.0 3.5 - 5.0 g/dL   AST 21 15 - 41 U/L   ALT 26 17 - 63 U/L   Alkaline Phosphatase 46 38 - 126 U/L   Total Bilirubin 0.7 0.3 - 1.2 mg/dL   GFR calc non Af Amer 21 (L) >60 mL/min   GFR calc Af Amer 25 (L) >60 mL/min    Comment: (NOTE) The eGFR has been calculated using the CKD EPI equation. This calculation has not  been validated in all clinical situations. eGFR's persistently <60 mL/min signify possible Chronic Kidney Disease.    Anion gap 8 5 - 15  CBC with Differential     Status: Abnormal   Collection Time: 06/11/16  5:08 AM  Result Value Ref Range   WBC 13.7 (H) 4.0 - 10.5 K/uL   RBC 4.75 4.22 - 5.81 MIL/uL   Hemoglobin 14.8 13.0 - 17.0 g/dL   HCT 45.5 39.0 - 52.0 %   MCV 95.8 78.0 - 100.0 fL   MCH 31.2 26.0 - 34.0 pg   MCHC 32.5 30.0 - 36.0 g/dL   RDW 16.1 (H) 11.5 - 15.5 %   Platelets 145 (L) 150 - 400 K/uL   Neutrophils Relative % 70 %   Neutro Abs 9.6 (H) 1.7 - 7.7 K/uL   Lymphocytes Relative 16 %   Lymphs Abs 2.1 0.7 - 4.0 K/uL   Monocytes Relative 8 %   Monocytes Absolute 1.0 0.1 - 1.0 K/uL   Eosinophils Relative 6 %   Eosinophils Absolute 0.8 (H) 0.0 - 0.7 K/uL   Basophils Relative 0 %   Basophils Absolute 0.1 0.0 - 0.1 K/uL  Brain natriuretic peptide     Status: Abnormal   Collection Time: 06/11/16  5:08 AM  Result Value Ref Range  B Natriuretic Peptide 682.0 (H) 0.0 - 100.0 pg/mL  Troponin I     Status: Abnormal   Collection Time: 06/11/16  5:08 AM  Result Value Ref Range   Troponin I 0.08 (HH) <0.03 ng/mL    Comment: CRITICAL RESULT CALLED TO, READ BACK BY AND VERIFIED WITH: TALBOT,T AT 5:45AM ON 06/11/16 BY FESTERMAN,C   Glucose, capillary     Status: Abnormal   Collection Time: 06/11/16  7:47 AM  Result Value Ref Range   Glucose-Capillary 113 (H) 65 - 99 mg/dL   Comment 1 Notify RN    Comment 2 Document in Chart   MRSA PCR Screening     Status: None   Collection Time: 06/11/16  8:00 AM  Result Value Ref Range   MRSA by PCR NEGATIVE NEGATIVE    Comment:        The GeneXpert MRSA Assay (FDA approved for NASAL specimens only), is one component of a comprehensive MRSA colonization surveillance program. It is not intended to diagnose MRSA infection nor to guide or monitor treatment for MRSA infections.   Hemoglobin A1c     Status: Abnormal   Collection  Time: 06/11/16  8:06 AM  Result Value Ref Range   Hgb A1c MFr Bld 6.2 (H) 4.8 - 5.6 %    Comment: (NOTE)         Pre-diabetes: 5.7 - 6.4         Diabetes: >6.4         Glycemic control for adults with diabetes: <7.0    Mean Plasma Glucose 131 mg/dL    Comment: (NOTE) Performed At: [facility] [place], [place] [id number] Lindon Romp MD PJ:[id number]   TSH     Status: Abnormal   Collection Time: 06/11/16  8:06 AM  Result Value Ref Range   TSH 0.108 (L) 0.350 - 4.500 uIU/mL  Glucose, capillary     Status: Abnormal   Collection Time: 06/11/16 11:12 AM  Result Value Ref Range   Glucose-Capillary 235 (H) 65 - 99 mg/dL   Comment 1 Notify RN    Comment 2 Document in Chart   Glucose, capillary     Status: Abnormal   Collection Time: 06/11/16  4:20 PM  Result Value Ref Range   Glucose-Capillary 186 (H) 65 - 99 mg/dL   Comment 1 Notify RN    Comment 2 Document in Chart   Glucose, capillary     Status: Abnormal   Collection Time: 06/11/16  7:52 PM  Result Value Ref Range   Glucose-Capillary 216 (H) 65 - 99 mg/dL  Glucose, capillary     Status: Abnormal   Collection Time: 06/11/16 11:58 PM  Result Value Ref Range   Glucose-Capillary 158 (H) 65 - 99 mg/dL  Glucose, capillary     Status: Abnormal   Collection Time: 06/12/16  4:12 AM  Result Value Ref Range   Glucose-Capillary 175 (H) 65 - 99 mg/dL  Basic metabolic panel     Status: Abnormal   Collection Time: 06/12/16  4:35 AM  Result Value Ref Range   Sodium 136 135 - 145 mmol/L   Potassium 4.9 3.5 - 5.1 mmol/L    Comment: DELTA CHECK NOTED   Chloride 101 101 - 111 mmol/L   CO2 27 22 - 32 mmol/L   Glucose, Bld 145 (H) 65 - 99 mg/dL   BUN 74 (H) 6 - 20 mg/dL   Creatinine, Ser 2.44 (H) 0.61 - 1.24 mg/dL   Calcium  8.7 (L) 8.9 - 10.3 mg/dL   GFR calc non Af Amer 25 (L) >60 mL/min   GFR calc Af Amer 29 (L) >60 mL/min    Comment: (NOTE) The eGFR has been calculated using the CKD EPI equation. This  calculation has not been validated in all clinical situations. eGFR's persistently <60 mL/min signify possible Chronic Kidney Disease.    Anion gap 8 5 - 15  Glucose, capillary     Status: Abnormal   Collection Time: 06/12/16  7:26 AM  Result Value Ref Range   Glucose-Capillary 100 (H) 65 - 99 mg/dL   Comment 1 Notify RN    Comment 2 Document in Chart     ABGS No results for input(s): PHART, PO2ART, TCO2, HCO3 in the last 72 hours.  Invalid input(s): PCO2 CULTURES Recent Results (from the past 240 hour(s))  MRSA PCR Screening     Status: None   Collection Time: 06/11/16  8:00 AM  Result Value Ref Range Status   MRSA by PCR NEGATIVE NEGATIVE Final    Comment:        The GeneXpert MRSA Assay (FDA approved for NASAL specimens only), is one component of a comprehensive MRSA colonization surveillance program. It is not intended to diagnose MRSA infection nor to guide or monitor treatment for MRSA infections.    Studies/Results: Dg Chest Port 1 View  06/11/2016  CLINICAL DATA:  Acute onset of shortness of breath and nasal congestion. Chest congestion and productive cough. Initial encounter. EXAM: PORTABLE CHEST 1 VIEW COMPARISON:  Chest radiograph performed 05/10/2016 FINDINGS: Vascular congestion is noted. Increased interstitial markings may reflect mild interstitial edema. No definite pleural effusion or pneumothorax is seen The cardiomediastinal silhouette is mildly enlarged. Right paratracheal prominence is stable from prior studies and likely reflects normal vasculature. No acute osseous abnormalities are seen. IMPRESSION: Vascular congestion and mild cardiomegaly. Increased interstitial markings may reflect mild interstitial edema. Electronically Signed   By: Garald Balding M.D.   On: 06/11/2016 06:05    Medications: I have reviewed the patient's current medications.  Assesment:   Principal Problem:   Acute exacerbation of CHF (congestive heart failure) (HCC) Active  Problems:   Sleep apnea   Hypothyroid   Panhypopituitarism (HCC)   Moderate aortic regurgitation   Seizure disorder (HCC)   Diabetes ([place])   Essential hypertension   Morbid obesity due to excess calories (HCC)   Elevated troponin   COPD with exacerbation (HCC)   CKD (chronic kidney disease) stage 4, GFR 15-29 ml/min (HCC)   Acute on chronic congestive heart failure (HCC) low TSH   Plan:  Medications reviewed Will continue IV steroid, IV diuretics and nebulizer Continue BIPAP as needed Will do pulmonary and cardiology consult Will check T3 and T4, low TSH is probably from over correction Will monitor BMP    LOS: 1 day   Shanell Aden 06/12/2016, 8:05 AM

## 2016-06-12 NOTE — Consult Note (Signed)
Consult requested by: Dr. Legrand Rams  Consult requested for respiratory failure/COPD exacerbation:  HPI: This is a 70 year old who I have seen on multiple occasions with respiratory failure. 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 [name] on home oxygen. He started having more trouble with shortness of breath came to the emergency department was found to have what appears to be combined COPD exacerbation and heart failure. He says he feels better. He required BiPAP but is off that now. He says his breathing feels pretty good. He has been coughing and coughing up some sputum.  Past Medical History  Diagnosis Date  . Chronic obstructive pulmonary disease (HCC)     Chronic bronchitis;  home oxygen; multiple exacerbations  . Hypertension   . Cellulitis of lower leg   . Sleep apnea     Severe on a sleep study in 12/2010  . Hypothyroidism     10/2002: TSH-0.43, T4-0.77  . Tobacco abuse, in remission     20 pack years; discontinued 1998  . Hyperlipidemia     No lipid profile available  . 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 Obesity 10/28/2012  . Seizure disorder ([place])     Onset after craniotomy  . Aortic insufficiency 10/2012    a. Mod-severe, not an operable candidate.  . Chronic systolic CHF (congestive heart failure) ([place])     a.  Last echo 11/2014: EF 40-45%, diffuse HK, mild LVH, elevated LVEDP, mod-severe AI with severely thickened leaflets, dilated sinus of valsalva 4.5cm, visualized portion of prox ascending aorta 4.7cm, mild MR, mildly dilated LA/RV/RA, PASP 60. LV dysfunction felt due to AI. Cath 12/2014 with minimal CAD - 20% LM otherwise minimal  luminal irregularities.  . Diabetes mellitus, type 2 ([place])   . GI bleed     a. upper GIB in 03/2015 wit thrombocytopenia and ABL anemia. b. Colonoscopy with pooling of dark burgundy blood noted throughout colon but without obvious bleeding lesion. EGD 03/07/2015 showed probable candida esophagitis, mild gastritis, source for GI bleed not seen. c. Capsule study showed active bleeding at 2 hours into the SB.  . CKD (chronic kidney disease), stage IV (HCC)     S/P right nephrectomy for hypernephroma in 2010  . Chronic respiratory failure ([place])   . Pulmonary hypertension ([place])   . Candida esophagitis ([place])     a. Probable by EGD 03/2015.  Marland Kitchen RBBB      Family History  Problem Relation Age of Onset  . Cancer Mother   . Cancer Father   . Cancer Sister   . Heart failure Sister   . Cancer Brother   . Colon cancer Neg Hx      Social History   Social History  . Marital Status: Single    Spouse Name: N/A  . Number of Children: 1  . Years of Education: N/A   Occupational History  . Retired     Engineer, manufacturing systems   Social History Main Topics  . Smoking status: Former Smoker -- 1.00 packs/day    Types: Cigarettes    Start date: 11/20/1960    Quit date: 11/16/1989  . Smokeless tobacco: Never Used  . Alcohol Use: No  . Drug Use: No  . Sexual Activity: Not  Asked   Other Topics Concern  . None   Social History Narrative   Lives in [place] alone with good family support from his daughter.     ROS: He says he has had some chest pain. This does not sound like angina. He's had swelling of his legs which is about like it normally is but his left leg may be a little worse. He has muscle cramps. He has arthritis and wants to know if there's anything he can take. No seizures recently. Otherwise per the history and physical which I have reviewed    Objective: Vital signs in last 24 hours: Temp:  [97.5 F (36.4 C)-98.9 F (37.2 C)] 97.9 F (36.6 C) (07/06 0751) Pulse Rate:  [75-94] 89  (07/06 0800) Resp:  [11-29] 21 (07/06 0800) BP: (122-155)/(48-84) 155/78 mmHg (07/06 0800) SpO2:  [95 %-100 %] 96 % (07/06 0800) Weight change:  Last BM Date: 06/11/16  Intake/Output from previous day: 07/05 0701 - 07/06 0700 In: 840 [P.O.:840] Out: 2051 [Urine:2050; Stool:1]  PHYSICAL EXAM [name] awake and alert. [name] obese. [name] wearing nasal oxygen. His pupils react. Nose and throat are clear. Mucous membranes are moist. His neck is supple without masses. His chest shows some rhonchi. His heart is regular with a systolic heart murmur. His abdomen is soft obese without masses. He has edema of the extremities bilaterally left greater than the right. Central nervous system exam is grossly intact  Lab Results: Basic Metabolic Panel:  Recent Labs  06/11/16 0508 06/12/16 0435  NA 140 136  K 3.8 4.9  CL 101 101  CO2 31 27  GLUCOSE 111* 145*  BUN 75* 74*  CREATININE 2.80* 2.44*  CALCIUM 8.7* 8.7*   Liver Function Tests:  Recent Labs  06/11/16 0508  AST 21  ALT 26  ALKPHOS 46  BILITOT 0.7  PROT 8.0  ALBUMIN 4.0   No results for input(s): LIPASE, AMYLASE in the last 72 hours. No results for input(s): AMMONIA in the last 72 hours. CBC:  Recent Labs  06/11/16 0508  WBC 13.7*  NEUTROABS 9.6*  HGB 14.8  HCT 45.5  MCV 95.8  PLT 145*   Cardiac Enzymes:  Recent Labs  06/11/16 0508  TROPONINI 0.08*   BNP: No results for input(s): PROBNP in the last 72 hours. D-Dimer: No results for input(s): DDIMER in the last 72 hours. CBG:  Recent Labs  06/11/16 1112 06/11/16 1620 06/11/16 1952 06/11/16 2358 06/12/16 0412 06/12/16 0726  GLUCAP 235* 186* 216* 158* 175* 100*   Hemoglobin A1C:  Recent Labs  06/11/16 0806  HGBA1C 6.2*   Fasting Lipid Panel: No results for input(s): CHOL, HDL, LDLCALC, TRIG, CHOLHDL, LDLDIRECT in the last 72 hours. Thyroid Function Tests:  Recent Labs  06/11/16 0806  TSH 0.108*   Anemia Panel: No results for input(s):  VITAMINB12, FOLATE, FERRITIN, TIBC, IRON, RETICCTPCT in the last 72 hours. Coagulation: No results for input(s): LABPROT, INR in the last 72 hours. Urine Drug Screen: Drugs of Abuse  No results found for: LABOPIA, COCAINSCRNUR, LABBENZ, AMPHETMU, THCU, LABBARB  Alcohol Level: No results for input(s): ETH in the last 72 hours. Urinalysis: No results for input(s): COLORURINE, LABSPEC, PHURINE, GLUCOSEU, HGBUR, BILIRUBINUR, KETONESUR, PROTEINUR, UROBILINOGEN, NITRITE, LEUKOCYTESUR in the last 72 hours.  Invalid input(s): APPERANCEUR Misc. Labs:   ABGS: No results for input(s): PHART, PO2ART, TCO2, HCO3 in the last 72 hours.  Invalid input(s): PCO2   MICROBIOLOGY: Recent Results (from the past 240 hour(s))  MRSA PCR Screening     Status: None   Collection Time: 06/11/16  8:00 AM  Result Value Ref Range Status   MRSA by PCR NEGATIVE NEGATIVE Final    Comment:        The GeneXpert MRSA Assay (FDA approved for NASAL specimens only), is one component of a comprehensive MRSA colonization surveillance program. It is not intended to diagnose MRSA infection nor to guide or monitor treatment for MRSA infections.     Studies/Results: Dg Chest Port 1 View  06/11/2016  CLINICAL DATA:  Acute onset of shortness of breath and nasal congestion. Chest congestion and productive cough. Initial encounter. EXAM: PORTABLE CHEST 1 VIEW COMPARISON:  Chest radiograph performed 05/10/2016 FINDINGS: Vascular congestion is noted. Increased interstitial markings may reflect mild interstitial edema. No definite pleural effusion or pneumothorax is seen The cardiomediastinal silhouette is mildly enlarged. Right paratracheal prominence is stable from prior studies and likely reflects normal vasculature. No acute osseous abnormalities are seen. IMPRESSION: Vascular congestion and mild cardiomegaly. Increased interstitial markings may reflect mild interstitial edema. Electronically Signed   By: Garald Balding M.D.    On: 06/11/2016 06:05    Medications:  Prior to Admission:  Prescriptions prior to admission  Medication Sig Dispense Refill Last Dose  . ADVAIR DISKUS 250-50 MCG/DOSE AEPB Inhale 1 puff into the lungs 2 (two) times daily.   06/11/2016 at Unknown time  . albuterol (PROVENTIL) (2.5 MG/3ML) 0.083% nebulizer solution Inhale 3 mLs into the lungs every 6 (six) hours as needed. Shortness of breath/wheezing   06/11/2016 at Unknown time  . amLODipine (NORVASC) 5 MG tablet TAKE ONE TABLET BY MOUTH DAILY. 30 tablet 3 06/10/2016 at Unknown time  . budesonide-formoterol (SYMBICORT) 160-4.5 MCG/ACT inhaler Inhale 2 puffs into the lungs 2 (two) times daily.   06/11/2016 at Unknown time  . calcitRIOL (ROCALTROL) 0.5 MCG capsule Take 0.5 mcg by mouth 2 (two) times daily.   06/10/2016 at Unknown time  . cloNIDine (CATAPRES) 0.2 MG tablet TAKE (1) TABLET BY MOUTH (3) TIMES DAILY 90 tablet 6 06/10/2016 at Unknown time  . desmopressin (DDAVP) 0.2 MG tablet Take 1 tablet (0.2 mg total) by mouth 2 (two) times daily. 60 tablet 6 06/10/2016 at Unknown time  . fluticasone (FLONASE) 50 MCG/ACT nasal spray Place 2 sprays into both nostrils daily.    06/10/2016 at Unknown time  . levothyroxine (SYNTHROID, LEVOTHROID) 175 MCG tablet TAKE ONE TABLET BY MOUTH ONCE DAILY. 30 tablet 0 06/10/2016 at Unknown time  . pantoprazole (PROTONIX) 40 MG tablet Take 1 tablet (40 mg total) by mouth 2 (two) times daily before a meal. (Patient taking differently: Take 40 mg by mouth 2 (two) times daily. ) 60 tablet 3 06/10/2016 at Unknown time  . phenytoin (DILANTIN) 100 MG ER capsule Take 200-300 mg by mouth daily. Takes 200 mg on Monday and Thursdsay. On all other days of the week takes 300 mg.   06/10/2016 at Unknown time  . potassium chloride SA (K-DUR,KLOR-CON) 20 MEQ tablet Take 20 mEq by mouth 2 (two) times daily.   06/10/2016 at Unknown time  . predniSONE (DELTASONE) 20 MG tablet Take 2 tablets (40 mg total) by mouth daily. 10 tablet 0 06/10/2016 at Unknown  time  . simvastatin (ZOCOR) 20 MG tablet Take 20 mg by mouth daily.    06/10/2016 at Unknown time  . tiotropium (SPIRIVA) 18 MCG inhalation capsule Place 18 mcg into inhaler and inhale daily.   06/11/2016 at Unknown time  . torsemide (DEMADEX)  20 MG tablet Take 2 tablets (40 mg total) by mouth 2 (two) times daily. 120 tablet 6 06/10/2016 at Unknown time  . albuterol (PROVENTIL HFA;VENTOLIN HFA) 108 (90 Base) MCG/ACT inhaler Inhale 2 puffs into the lungs every 4 (four) hours as needed for wheezing or shortness of breath. 1 Inhaler 3 unknown  . polyethylene glycol powder (GLYCOLAX/MIRALAX) powder Take 17 g by mouth daily. May decrease to 17 g three times a week if needed. (Patient taking differently: Take 17 g by mouth daily. May decrease to 17 g three times a week if needed.) 255 g 3 unknown  . Vitamin D, Ergocalciferol, (DRISDOL) 50000 UNITS CAPS capsule Take 1 capsule (50,000 Units total) by mouth every 7 (seven) days. Monday 12 capsule 0 unknown   Scheduled: . albuterol  2.5 mg Nebulization Q6H  . amLODipine  5 mg Oral Daily  . aspirin EC  81 mg Oral Daily  . calcitRIOL  0.5 mcg Oral BID  . cloNIDine  0.2 mg Oral TID  . desmopressin  0.2 mg Oral BID  . furosemide  40 mg Intravenous BID  . heparin  5,000 Units Subcutaneous Q8H  . insulin aspart  0-20 Units Subcutaneous Q4H  . levothyroxine  175 mcg Oral QAC breakfast  . loratadine  10 mg Oral Daily  . methylPREDNISolone (SOLU-MEDROL) injection  40 mg Intravenous Q6H  . mometasone-formoterol  2 puff Inhalation BID  . pantoprazole  40 mg Oral BID  . phenytoin  200 mg Oral Once per day on Mon Thu   And  . phenytoin  300 mg Oral Once per day on Sun Tue Wed Fri Sat  . polyethylene glycol  17 g Oral Daily  . simvastatin  20 mg Oral Daily  . sodium chloride flush  3 mL Intravenous Q12H  . [START ON 06/13/2016] Vitamin D (Ergocalciferol)  50,000 Units Oral Q7 days   Continuous:  [id number], albuterol, sodium chloride  Assesment: He was  admitted with acute exacerbation of CHF and is being treated for that. He also has COPD exacerbation. He has multiple other medical issues as noted. [name] off BiPAP now. He seems to be improving. Principal Problem:   Acute exacerbation of CHF (congestive heart failure) (HCC) Active Problems:   Sleep apnea   Hypothyroid   Panhypopituitarism (HCC)   Moderate aortic regurgitation   Seizure disorder (HCC)   Diabetes ([place])   Essential hypertension   Morbid obesity due to excess calories (HCC)   Elevated troponin   COPD with exacerbation (HCC)   CKD (chronic kidney disease) stage 4, GFR 15-29 ml/min (HCC)   Acute on chronic congestive heart failure (HCC)    Plan: Continue current treatment including IV diuresis IV steroids and inhaled bronchodilators and oxygen. [name] not a candidate for anything for arthritis because of his renal failure. Because he had some cough and sputum production I'm going to add doxycycline.  Thanks for allowing me to see him with you    LOS: 1 day   Emmalina Espericueta L 06/12/2016, 8:56 AM

## 2016-06-13 LAB — GLUCOSE, CAPILLARY
GLUCOSE-CAPILLARY: 132 mg/dL — AB (ref 65–99)
GLUCOSE-CAPILLARY: 125 mg/dL — AB (ref 65–99)
Glucose-Capillary: 155 mg/dL — ABNORMAL HIGH (ref 65–99)
Glucose-Capillary: 169 mg/dL — ABNORMAL HIGH (ref 65–99)
Glucose-Capillary: 217 mg/dL — ABNORMAL HIGH (ref 65–99)
Glucose-Capillary: 93 mg/dL (ref 65–99)

## 2016-06-13 LAB — BASIC METABOLIC PANEL WITH GFR
Anion gap: 9 (ref 5–15)
BUN: 74 mg/dL — ABNORMAL HIGH (ref 6–20)
CO2: 25 mmol/L (ref 22–32)
Calcium: 8.6 mg/dL — ABNORMAL LOW (ref 8.9–10.3)
Chloride: 98 mmol/L — ABNORMAL LOW (ref 101–111)
Creatinine, Ser: 2.32 mg/dL — ABNORMAL HIGH (ref 0.61–1.24)
GFR calc Af Amer: 31 mL/min — ABNORMAL LOW
GFR calc non Af Amer: 27 mL/min — ABNORMAL LOW
Glucose, Bld: 113 mg/dL — ABNORMAL HIGH (ref 65–99)
Potassium: 4.8 mmol/L (ref 3.5–5.1)
Sodium: 132 mmol/L — ABNORMAL LOW (ref 135–145)

## 2016-06-13 LAB — T3, FREE: T3, Free: 2 pg/mL (ref 2.0–4.4)

## 2016-06-13 MED ORDER — FUROSEMIDE 10 MG/ML IJ SOLN
60.0000 mg | Freq: Three times a day (TID) | INTRAMUSCULAR | Status: DC
  Administered 2016-06-13 – 2016-06-14 (×3): 60 mg via INTRAVENOUS
  Filled 2016-06-13 (×3): qty 6

## 2016-06-13 MED ORDER — ISOSORB DINITRATE-HYDRALAZINE 20-37.5 MG PO TABS
1.0000 | ORAL_TABLET | Freq: Three times a day (TID) | ORAL | Status: DC
  Administered 2016-06-13 – 2016-06-17 (×12): 1 via ORAL
  Filled 2016-06-13 (×20): qty 1

## 2016-06-13 NOTE — Care Management Important Message (Signed)
Important Message  Patient Details  Name: [name] [name] MRN: [id number] Date of Birth: 1946-01-13   Medicare Important Message Given:  Yes    Danila Nikolay, Chauncey Reading, RN 06/13/2016, 10:11 AM

## 2016-06-13 NOTE — Progress Notes (Signed)
Subjective: Patient feels better. His breathing is improving. He was evaluated by cardiology and pulmonary and his diuretic is adjusted. No new complaint.  Objective: Vital signs in last 24 hours: Temp:  [97.8 F (36.6 C)-98 F (36.7 C)] 97.8 F (36.6 C) (07/07 0500) Pulse Rate:  [75-92] 78 (07/07 0800) Resp:  [13-26] 17 (07/07 0800) BP: (111-151)/(44-73) 126/54 mmHg (07/07 0800) SpO2:  [94 %-99 %] 95 % (07/07 0802) FiO2 (%):  [28 %] 28 % (07/06 1400) Weight change:  Last BM Date: 06/11/16  Intake/Output from previous day: 07/06 0701 - 07/07 0700 In: 1200 [P.O.:1200] Out: 2775 [Urine:2775]  PHYSICAL EXAM General appearance: alert, no distress and moderately obese Resp: diminished breath sounds bilaterally and rhonchi bilaterally Cardio: S1, S2 normal GI: soft, non-tender; bowel sounds normal; no masses,  no organomegaly Extremities: edema 2++ leg edema  Lab Results:  Results for orders placed or performed during the hospital encounter of 06/11/16 (from the past 48 hour(s))  Glucose, capillary     Status: Abnormal   Collection Time: 06/11/16 11:12 AM  Result Value Ref Range   Glucose-Capillary 235 (H) 65 - 99 mg/dL   Comment 1 Notify RN    Comment 2 Document in Chart   Glucose, capillary     Status: Abnormal   Collection Time: 06/11/16  4:20 PM  Result Value Ref Range   Glucose-Capillary 186 (H) 65 - 99 mg/dL   Comment 1 Notify RN    Comment 2 Document in Chart   Glucose, capillary     Status: Abnormal   Collection Time: 06/11/16  7:52 PM  Result Value Ref Range   Glucose-Capillary 216 (H) 65 - 99 mg/dL  Glucose, capillary     Status: Abnormal   Collection Time: 06/11/16 11:58 PM  Result Value Ref Range   Glucose-Capillary 158 (H) 65 - 99 mg/dL  Glucose, capillary     Status: Abnormal   Collection Time: 06/12/16  4:12 AM  Result Value Ref Range   Glucose-Capillary 175 (H) 65 - 99 mg/dL  Basic metabolic panel     Status: Abnormal   Collection Time: 06/12/16   4:35 AM  Result Value Ref Range   Sodium 136 135 - 145 mmol/L   Potassium 4.9 3.5 - 5.1 mmol/L    Comment: DELTA CHECK NOTED   Chloride 101 101 - 111 mmol/L   CO2 27 22 - 32 mmol/L   Glucose, Bld 145 (H) 65 - 99 mg/dL   BUN 74 (H) 6 - 20 mg/dL   Creatinine, Ser 2.44 (H) 0.61 - 1.24 mg/dL   Calcium 8.7 (L) 8.9 - 10.3 mg/dL   GFR calc non Af Amer 25 (L) >60 mL/min   GFR calc Af Amer 29 (L) >60 mL/min    Comment: (NOTE) The eGFR has been calculated using the CKD EPI equation. This calculation has not been validated in all clinical situations. eGFR's persistently <60 mL/min signify possible Chronic Kidney Disease.    Anion gap 8 5 - 15  T4, free     Status: None   Collection Time: 06/12/16  5:08 AM  Result Value Ref Range   Free T4 0.68 0.61 - 1.12 ng/dL    Comment: (NOTE) Biotin ingestion may interfere with free T4 tests. If the results are inconsistent with the TSH level, previous test results, or the clinical presentation, then consider biotin interference. If needed, order repeat testing after stopping biotin. Performed at [facility]   Troponin I     Status:  Abnormal   Collection Time: 06/12/16  5:32 AM  Result Value Ref Range   Troponin I 0.07 (HH) <0.03 ng/mL    Comment: CRITICAL VALUE NOTED.  VALUE IS CONSISTENT WITH PREVIOUSLY REPORTED AND CALLED VALUE.  Glucose, capillary     Status: Abnormal   Collection Time: 06/12/16  7:26 AM  Result Value Ref Range   Glucose-Capillary 100 (H) 65 - 99 mg/dL   Comment 1 Notify RN    Comment 2 Document in Chart   Glucose, capillary     Status: Abnormal   Collection Time: 06/12/16 11:36 AM  Result Value Ref Range   Glucose-Capillary 154 (H) 65 - 99 mg/dL   Comment 1 Notify RN    Comment 2 Document in Chart   Glucose, capillary     Status: Abnormal   Collection Time: 06/12/16  4:17 PM  Result Value Ref Range   Glucose-Capillary 115 (H) 65 - 99 mg/dL   Comment 1 Notify RN    Comment 2 Document in Chart   Glucose,  capillary     Status: Abnormal   Collection Time: 06/12/16  8:05 PM  Result Value Ref Range   Glucose-Capillary 175 (H) 65 - 99 mg/dL  Glucose, capillary     Status: Abnormal   Collection Time: 06/13/16 12:01 AM  Result Value Ref Range   Glucose-Capillary 169 (H) 65 - 99 mg/dL  Basic metabolic panel     Status: Abnormal   Collection Time: 06/13/16  4:07 AM  Result Value Ref Range   Sodium 132 (L) 135 - 145 mmol/L   Potassium 4.8 3.5 - 5.1 mmol/L   Chloride 98 (L) 101 - 111 mmol/L   CO2 25 22 - 32 mmol/L   Glucose, Bld 113 (H) 65 - 99 mg/dL   BUN 74 (H) 6 - 20 mg/dL   Creatinine, Ser 2.32 (H) 0.61 - 1.24 mg/dL   Calcium 8.6 (L) 8.9 - 10.3 mg/dL   GFR calc non Af Amer 27 (L) >60 mL/min   GFR calc Af Amer 31 (L) >60 mL/min    Comment: (NOTE) The eGFR has been calculated using the CKD EPI equation. This calculation has not been validated in all clinical situations. eGFR's persistently <60 mL/min signify possible Chronic Kidney Disease.    Anion gap 9 5 - 15  Glucose, capillary     Status: Abnormal   Collection Time: 06/13/16  4:54 AM  Result Value Ref Range   Glucose-Capillary 155 (H) 65 - 99 mg/dL  Glucose, capillary     Status: None   Collection Time: 06/13/16  7:53 AM  Result Value Ref Range   Glucose-Capillary 93 65 - 99 mg/dL    ABGS No results for input(s): PHART, PO2ART, TCO2, HCO3 in the last 72 hours.  Invalid input(s): PCO2 CULTURES Recent Results (from the past 240 hour(s))  MRSA PCR Screening     Status: None   Collection Time: 06/11/16  8:00 AM  Result Value Ref Range Status   MRSA by PCR NEGATIVE NEGATIVE Final    Comment:        The GeneXpert MRSA Assay (FDA approved for NASAL specimens only), is one component of a comprehensive MRSA colonization surveillance program. It is not intended to diagnose MRSA infection nor to guide or monitor treatment for MRSA infections.    Studies/Results: No results found.  Medications: I have reviewed the  patient's current medications.  Assesment:   Principal Problem:   Acute exacerbation of CHF (congestive heart failure) ([place])  Active Problems:   Sleep apnea   Hypothyroid   Panhypopituitarism (HCC)   Moderate aortic regurgitation   Seizure disorder (HCC)   Diabetes ([place])   Essential hypertension   Morbid obesity due to excess calories (HCC)   Elevated troponin   COPD with exacerbation (HCC)   CKD (chronic kidney disease) stage 4, GFR 15-29 ml/min (HCC)   Acute on chronic congestive heart failure (HCC) low TSH   Plan:  Medications reviewed Will continue IV steroid, IV diuretics and nebulizer Continue BIPAP as needed Cardiology and pulmonary consults are appreciatedt Will monitor BMP    LOS: 2 days   Henslee Lottman 06/13/2016, 8:16 AM

## 2016-06-13 NOTE — Progress Notes (Signed)
Subjective:    SOB improving.   Objective:   Temp:  [97.8 F (36.6 C)-98 F (36.7 C)] 97.8 F (36.6 C) (07/07 0800) Pulse Rate:  [75-92] 78 (07/07 0800) Resp:  [13-26] 17 (07/07 0800) BP: (111-151)/(44-73) 126/54 mmHg (07/07 0800) SpO2:  [94 %-99 %] 95 % (07/07 0802) FiO2 (%):  [28 %] 28 % (07/06 1400) Last BM Date: 06/11/16  Filed Weights   06/11/16 0751  Weight: 256 lb 2.8 oz (116.2 kg)    Intake/Output Summary (Last 24 hours) at 06/13/16 0850 Last data filed at 06/13/16 0500  Gross per 24 hour  Intake   1200 ml  Output   2475 ml  Net  -1275 ml    Telemetry: SR  Exam:  General: NAD  HEENT: sclera clear, throat clear  Resp: mild crackles and wheezing bilaterally  Cardiac: RRR, 2/6 diastolic murmur RUSB, 2/6 systolic murmur LLSB  GI: abdomen soft, NT, ND  MSK:1+ bilateral LE edema  Neuro: no focal deficits  Psych: appropriate affect  Lab Results:  Basic Metabolic Panel:  Recent Labs Lab 06/11/16 0508 06/12/16 0435 06/13/16 0407  NA 140 136 132*  K 3.8 4.9 4.8  CL 101 101 98*  CO2 31 27 25   GLUCOSE 111* 145* 113*  BUN 75* 74* 74*  CREATININE 2.80* 2.44* 2.32*  CALCIUM 8.7* 8.7* 8.6*    Liver Function Tests:  Recent Labs Lab 06/11/16 0508  AST 21  ALT 26  ALKPHOS 46  BILITOT 0.7  PROT 8.0  ALBUMIN 4.0    CBC:  Recent Labs Lab 06/11/16 0508  WBC 13.7*  HGB 14.8  HCT 45.5  MCV 95.8  PLT 145*    Cardiac Enzymes:  Recent Labs Lab 06/11/16 0508 06/12/16 0532  TROPONINI 0.08* 0.07*    BNP: No results for input(s): PROBNP in the last 8760 hours.  Coagulation: No results for input(s): INR in the last 168 hours.  ECG:   Medications:   Scheduled Medications: . albuterol  2.5 mg Nebulization Q6H  . amLODipine  5 mg Oral Daily  . aspirin EC  81 mg Oral Daily  . calcitRIOL  0.5 mcg Oral BID  . cloNIDine  0.2 mg Oral TID  . desmopressin  0.2 mg Oral BID  . doxycycline  100 mg Oral Q12H  . furosemide  60 mg  Intravenous BID  . heparin  5,000 Units Subcutaneous Q8H  . insulin aspart  0-20 Units Subcutaneous Q4H  . levothyroxine  175 mcg Oral QAC breakfast  . loratadine  10 mg Oral Daily  . methylPREDNISolone (SOLU-MEDROL) injection  40 mg Intravenous Q6H  . mometasone-formoterol  2 puff Inhalation BID  . pantoprazole  40 mg Oral BID  . phenytoin  200 mg Oral Once per day on Mon Thu   And  . phenytoin  300 mg Oral Once per day on Sun Tue Wed Fri Sat  . polyethylene glycol  17 g Oral Daily  . simvastatin  20 mg Oral Daily  . sodium chloride flush  3 mL Intravenous Q12H  . Vitamin D (Ergocalciferol)  50,000 Units Oral Q7 days     Infusions:     PRN Medications:  acetaminophen, albuterol, sodium chloride     Assessment/Plan   1. Acute on chronic combined LV systolic/diastolic dysfunction with RV dysfunction - LV systolic/diastolic dysfunction with LVEF 40-45% and restrictive diastolic dysfunction, chronic RV systolic sysfunction, moderate AI, MR,TR, and pulmonary HTN  - Management and diureisis is complicated by  CKD IV  - negative 1.5 liters yesterday, negative 2.8 liters since admission. [name] on loasix 60mg  IV bid, downtrend in Cr consistent with CHF and venous congestion - continue IV diuretics today. Increase to lasix 60mg  tid - Severe airway obstruction on previous PFTs, I presume the reason he is not on beta blocker for systolic dysfunction. No ACE/ARB/aldactone/entresto due to poor renal function. - we will start bidil tid in setting of systolic dysfunction, to make room with bp stop norvasc.  - at discharge would change to torsemide 60mg  in AM and 40mg  pm.   2. Elevated troponin - Mild flat troponin in setting of CHF and hypoxia, continue to cycle, no plans for ischemic testing at this time       Carlyle Dolly, M.D.

## 2016-06-13 NOTE — Progress Notes (Signed)
Subjective: He says he feels better this morning. He did not require BiPAP last night. His breathing is substantially better.  Objective: Vital signs in last 24 hours: Temp:  [97.8 F (36.6 C)-98 F (36.7 C)] 97.8 F (36.6 C) (07/07 0500) Pulse Rate:  [75-92] 86 (07/07 0600) Resp:  [13-21] 19 (07/07 0600) BP: (111-155)/(44-78) 133/70 mmHg (07/07 0600) SpO2:  [94 %-99 %] 97 % (07/07 0600) FiO2 (%):  [28 %] 28 % (07/06 1400) Weight change:  Last BM Date: 06/11/16  Intake/Output from previous day: 07/06 0701 - 07/07 0700 In: 1200 [P.O.:1200] Out: 2775 [Urine:2775]  PHYSICAL EXAM General appearance: alert, cooperative and no distress Resp: rhonchi bilaterally Cardio: His heart is regular. I do not hear a gallop GI: soft, non-tender; bowel sounds normal; no masses,  no organomegaly Extremities: He still has 1+ edema and chronic venous stasis changes  Lab Results:  Results for orders placed or performed during the hospital encounter of 06/11/16 (from the past 48 hour(s))  Glucose, capillary     Status: Abnormal   Collection Time: 06/11/16  7:47 AM  Result Value Ref Range   Glucose-Capillary 113 (H) 65 - 99 mg/dL   Comment 1 Notify RN    Comment 2 Document in Chart   MRSA PCR Screening     Status: None   Collection Time: 06/11/16  8:00 AM  Result Value Ref Range   MRSA by PCR NEGATIVE NEGATIVE    Comment:        The GeneXpert MRSA Assay (FDA approved for NASAL specimens only), is one component of a comprehensive MRSA colonization surveillance program. It is not intended to diagnose MRSA infection nor to guide or monitor treatment for MRSA infections.   Hemoglobin A1c     Status: Abnormal   Collection Time: 06/11/16  8:06 AM  Result Value Ref Range   Hgb A1c MFr Bld 6.2 (H) 4.8 - 5.6 %    Comment: (NOTE)         Pre-diabetes: 5.7 - 6.4         Diabetes: >6.4         Glycemic control for adults with diabetes: <7.0    Mean Plasma Glucose 131 mg/dL    Comment:  (NOTE) Performed At: [facility] [place], [place] [id number] Lindon Romp MD [id number]   TSH     Status: Abnormal   Collection Time: 06/11/16  8:06 AM  Result Value Ref Range   TSH 0.108 (L) 0.350 - 4.500 uIU/mL  Glucose, capillary     Status: Abnormal   Collection Time: 06/11/16 11:12 AM  Result Value Ref Range   Glucose-Capillary 235 (H) 65 - 99 mg/dL   Comment 1 Notify RN    Comment 2 Document in Chart   Glucose, capillary     Status: Abnormal   Collection Time: 06/11/16  4:20 PM  Result Value Ref Range   Glucose-Capillary 186 (H) 65 - 99 mg/dL   Comment 1 Notify RN    Comment 2 Document in Chart   Glucose, capillary     Status: Abnormal   Collection Time: 06/11/16  7:52 PM  Result Value Ref Range   Glucose-Capillary 216 (H) 65 - 99 mg/dL  Glucose, capillary     Status: Abnormal   Collection Time: 06/11/16 11:58 PM  Result Value Ref Range   Glucose-Capillary 158 (H) 65 - 99 mg/dL  Glucose, capillary     Status: Abnormal   Collection Time: 06/12/16  4:12  AM  Result Value Ref Range   Glucose-Capillary 175 (H) 65 - 99 mg/dL  Basic metabolic panel     Status: Abnormal   Collection Time: 06/12/16  4:35 AM  Result Value Ref Range   Sodium 136 135 - 145 mmol/L   Potassium 4.9 3.5 - 5.1 mmol/L    Comment: DELTA CHECK NOTED   Chloride 101 101 - 111 mmol/L   CO2 27 22 - 32 mmol/L   Glucose, Bld 145 (H) 65 - 99 mg/dL   BUN 74 (H) 6 - 20 mg/dL   Creatinine, Ser 2.44 (H) 0.61 - 1.24 mg/dL   Calcium 8.7 (L) 8.9 - 10.3 mg/dL   GFR calc non Af Amer 25 (L) >60 mL/min   GFR calc Af Amer 29 (L) >60 mL/min    Comment: (NOTE) The eGFR has been calculated using the CKD EPI equation. This calculation has not been validated in all clinical situations. eGFR's persistently <60 mL/min signify possible Chronic Kidney Disease.    Anion gap 8 5 - 15  T4, free     Status: None   Collection Time: 06/12/16  5:08 AM  Result Value Ref Range   Free T4 0.68 0.61  - 1.12 ng/dL    Comment: (NOTE) Biotin ingestion may interfere with free T4 tests. If the results are inconsistent with the TSH level, previous test results, or the clinical presentation, then consider biotin interference. If needed, order repeat testing after stopping biotin. Performed at [facility]   Troponin I     Status: Abnormal   Collection Time: 06/12/16  5:32 AM  Result Value Ref Range   Troponin I 0.07 (HH) <0.03 ng/mL    Comment: CRITICAL VALUE NOTED.  VALUE IS CONSISTENT WITH PREVIOUSLY REPORTED AND CALLED VALUE.  Glucose, capillary     Status: Abnormal   Collection Time: 06/12/16  7:26 AM  Result Value Ref Range   Glucose-Capillary 100 (H) 65 - 99 mg/dL   Comment 1 Notify RN    Comment 2 Document in Chart   Glucose, capillary     Status: Abnormal   Collection Time: 06/12/16 11:36 AM  Result Value Ref Range   Glucose-Capillary 154 (H) 65 - 99 mg/dL   Comment 1 Notify RN    Comment 2 Document in Chart   Glucose, capillary     Status: Abnormal   Collection Time: 06/12/16  4:17 PM  Result Value Ref Range   Glucose-Capillary 115 (H) 65 - 99 mg/dL   Comment 1 Notify RN    Comment 2 Document in Chart   Glucose, capillary     Status: Abnormal   Collection Time: 06/12/16  8:05 PM  Result Value Ref Range   Glucose-Capillary 175 (H) 65 - 99 mg/dL  Glucose, capillary     Status: Abnormal   Collection Time: 06/13/16 12:01 AM  Result Value Ref Range   Glucose-Capillary 169 (H) 65 - 99 mg/dL  Basic metabolic panel     Status: Abnormal   Collection Time: 06/13/16  4:07 AM  Result Value Ref Range   Sodium 132 (L) 135 - 145 mmol/L   Potassium 4.8 3.5 - 5.1 mmol/L   Chloride 98 (L) 101 - 111 mmol/L   CO2 25 22 - 32 mmol/L   Glucose, Bld 113 (H) 65 - 99 mg/dL   BUN 74 (H) 6 - 20 mg/dL   Creatinine, Ser 2.32 (H) 0.61 - 1.24 mg/dL   Calcium 8.6 (L) 8.9 - 10.3 mg/dL  GFR calc non Af Amer 27 (L) >60 mL/min   GFR calc Af Amer 31 (L) >60 mL/min    Comment: (NOTE) The  eGFR has been calculated using the CKD EPI equation. This calculation has not been validated in all clinical situations. eGFR's persistently <60 mL/min signify possible Chronic Kidney Disease.    Anion gap 9 5 - 15  Glucose, capillary     Status: Abnormal   Collection Time: 06/13/16  4:54 AM  Result Value Ref Range   Glucose-Capillary 155 (H) 65 - 99 mg/dL    ABGS No results for input(s): PHART, PO2ART, TCO2, HCO3 in the last 72 hours.  Invalid input(s): PCO2 CULTURES Recent Results (from the past 240 hour(s))  MRSA PCR Screening     Status: None   Collection Time: 06/11/16  8:00 AM  Result Value Ref Range Status   MRSA by PCR NEGATIVE NEGATIVE Final    Comment:        The GeneXpert MRSA Assay (FDA approved for NASAL specimens only), is one component of a comprehensive MRSA colonization surveillance program. It is not intended to diagnose MRSA infection nor to guide or monitor treatment for MRSA infections.    Studies/Results: No results found.  Medications:  Prior to Admission:  Prescriptions prior to admission  Medication Sig Dispense Refill Last Dose  . ADVAIR DISKUS 250-50 MCG/DOSE AEPB Inhale 1 puff into the lungs 2 (two) times daily.   06/11/2016 at Unknown time  . albuterol (PROVENTIL) (2.5 MG/3ML) 0.083% nebulizer solution Inhale 3 mLs into the lungs every 6 (six) hours as needed. Shortness of breath/wheezing   06/11/2016 at Unknown time  . amLODipine (NORVASC) 5 MG tablet TAKE ONE TABLET BY MOUTH DAILY. 30 tablet 3 06/10/2016 at Unknown time  . budesonide-formoterol (SYMBICORT) 160-4.5 MCG/ACT inhaler Inhale 2 puffs into the lungs 2 (two) times daily.   06/11/2016 at Unknown time  . calcitRIOL (ROCALTROL) 0.5 MCG capsule Take 0.5 mcg by mouth 2 (two) times daily.   06/10/2016 at Unknown time  . cloNIDine (CATAPRES) 0.2 MG tablet TAKE (1) TABLET BY MOUTH (3) TIMES DAILY 90 tablet 6 06/10/2016 at Unknown time  . desmopressin (DDAVP) 0.2 MG tablet Take 1 tablet (0.2 mg total)  by mouth 2 (two) times daily. 60 tablet 6 06/10/2016 at Unknown time  . fluticasone (FLONASE) 50 MCG/ACT nasal spray Place 2 sprays into both nostrils daily.    06/10/2016 at Unknown time  . levothyroxine (SYNTHROID, LEVOTHROID) 175 MCG tablet TAKE ONE TABLET BY MOUTH ONCE DAILY. 30 tablet 0 06/10/2016 at Unknown time  . pantoprazole (PROTONIX) 40 MG tablet Take 1 tablet (40 mg total) by mouth 2 (two) times daily before a meal. (Patient taking differently: Take 40 mg by mouth 2 (two) times daily. ) 60 tablet 3 06/10/2016 at Unknown time  . phenytoin (DILANTIN) 100 MG ER capsule Take 200-300 mg by mouth daily. Takes 200 mg on Monday and Thursdsay. On all other days of the week takes 300 mg.   06/10/2016 at Unknown time  . potassium chloride SA (K-DUR,KLOR-CON) 20 MEQ tablet Take 20 mEq by mouth 2 (two) times daily.   06/10/2016 at Unknown time  . predniSONE (DELTASONE) 20 MG tablet Take 2 tablets (40 mg total) by mouth daily. 10 tablet 0 06/10/2016 at Unknown time  . simvastatin (ZOCOR) 20 MG tablet Take 20 mg by mouth daily.    06/10/2016 at Unknown time  . tiotropium (SPIRIVA) 18 MCG inhalation capsule Place 18 mcg into inhaler and inhale  daily.   06/11/2016 at Unknown time  . torsemide (DEMADEX) 20 MG tablet Take 2 tablets (40 mg total) by mouth 2 (two) times daily. 120 tablet 6 06/10/2016 at Unknown time  . albuterol (PROVENTIL HFA;VENTOLIN HFA) 108 (90 Base) MCG/ACT inhaler Inhale 2 puffs into the lungs every 4 (four) hours as needed for wheezing or shortness of breath. 1 Inhaler 3 unknown  . polyethylene glycol powder (GLYCOLAX/MIRALAX) powder Take 17 g by mouth daily. May decrease to 17 g three times a week if needed. (Patient taking differently: Take 17 g by mouth daily. May decrease to 17 g three times a week if needed.) 255 g 3 unknown  . Vitamin D, Ergocalciferol, (DRISDOL) 50000 UNITS CAPS capsule Take 1 capsule (50,000 Units total) by mouth every 7 (seven) days. Monday 12 capsule 0 unknown   Scheduled: .  albuterol  2.5 mg Nebulization Q6H  . amLODipine  5 mg Oral Daily  . aspirin EC  81 mg Oral Daily  . calcitRIOL  0.5 mcg Oral BID  . cloNIDine  0.2 mg Oral TID  . desmopressin  0.2 mg Oral BID  . doxycycline  100 mg Oral Q12H  . furosemide  60 mg Intravenous BID  . heparin  5,000 Units Subcutaneous Q8H  . insulin aspart  0-20 Units Subcutaneous Q4H  . levothyroxine  175 mcg Oral QAC breakfast  . loratadine  10 mg Oral Daily  . methylPREDNISolone (SOLU-MEDROL) injection  40 mg Intravenous Q6H  . mometasone-formoterol  2 puff Inhalation BID  . pantoprazole  40 mg Oral BID  . phenytoin  200 mg Oral Once per day on Mon Thu   And  . phenytoin  300 mg Oral Once per day on Sun Tue Wed Fri Sat  . polyethylene glycol  17 g Oral Daily  . simvastatin  20 mg Oral Daily  . sodium chloride flush  3 mL Intravenous Q12H  . Vitamin D (Ergocalciferol)  50,000 Units Oral Q7 days   Continuous:  MEQ:[id number], albuterol, sodium chloride  Assesment: [name] admitted with acute on chronic respiratory failure. He has acute on chronic CHF. He has COPD exacerbation. [name] improving. He has sleep apnea and is noncompliant with CPAP. He has other cardiac issues including moderate aortic regurgitation and is not felt to be a candidate for surgery. [name] definitely better Principal Problem:   Acute exacerbation of CHF (congestive heart failure) (HCC) Active Problems:   Sleep apnea   Hypothyroid   Panhypopituitarism (HCC)   Moderate aortic regurgitation   Seizure disorder (HCC)   Diabetes ([place])   Essential hypertension   Morbid obesity due to excess calories (HCC)   Elevated troponin   COPD with exacerbation (HCC)   CKD (chronic kidney disease) stage 4, GFR 15-29 ml/min (HCC)   Acute on chronic congestive heart failure ([place])    Plan: Continue current treatments. He seems to be improving.    LOS: 2 days   Syrenity Klepacki L 06/13/2016, 7:35 AM

## 2016-06-14 LAB — BASIC METABOLIC PANEL
ANION GAP: 10 (ref 5–15)
BUN: 81 mg/dL — AB (ref 6–20)
CALCIUM: 8.5 mg/dL — AB (ref 8.9–10.3)
CO2: 24 mmol/L (ref 22–32)
Chloride: 100 mmol/L — ABNORMAL LOW (ref 101–111)
Creatinine, Ser: 2.47 mg/dL — ABNORMAL HIGH (ref 0.61–1.24)
GFR calc Af Amer: 29 mL/min — ABNORMAL LOW (ref >60)
GFR calc non Af Amer: 25 mL/min — ABNORMAL LOW (ref >60)
GLUCOSE: 114 mg/dL — AB (ref 65–99)
Potassium: 4.8 mmol/L (ref 3.5–5.1)
Sodium: 134 mmol/L — ABNORMAL LOW (ref 135–145)

## 2016-06-14 LAB — GLUCOSE, CAPILLARY
GLUCOSE-CAPILLARY: 76 mg/dL (ref 65–99)
GLUCOSE-CAPILLARY: 108 mg/dL — AB (ref 65–99)
GLUCOSE-CAPILLARY: 152 mg/dL — AB (ref 65–99)
GLUCOSE-CAPILLARY: 150 mg/dL — AB (ref 65–99)
Glucose-Capillary: 147 mg/dL — ABNORMAL HIGH (ref 65–99)
Glucose-Capillary: 166 mg/dL — ABNORMAL HIGH (ref 65–99)

## 2016-06-14 MED ORDER — FUROSEMIDE 10 MG/ML IJ SOLN
40.0000 mg | Freq: Two times a day (BID) | INTRAMUSCULAR | Status: DC
  Administered 2016-06-14 – 2016-06-17 (×6): 40 mg via INTRAVENOUS
  Filled 2016-06-14 (×6): qty 4

## 2016-06-14 MED ORDER — METHYLPREDNISOLONE SODIUM SUCC 40 MG IJ SOLR
40.0000 mg | Freq: Two times a day (BID) | INTRAMUSCULAR | Status: DC
  Administered 2016-06-14: 40 mg via INTRAVENOUS
  Filled 2016-06-14: qty 1

## 2016-06-14 NOTE — Progress Notes (Signed)
Subjective: Patient is resting. [name] diuresing well. He has duiresed  About 5 liters since admission. His breathing is better. His leg edema is improving. His renal function getting worse. Objective: Vital signs in last 24 hours: Temp:  [97.2 F (36.2 C)-98.6 F (37 C)] 98.2 F (36.8 C) (07/08 0734) Pulse Rate:  [67-92] 86 (07/08 0900) Resp:  [14-22] 17 (07/08 0900) BP: (116-159)/(52-107) 159/63 mmHg (07/08 0900) SpO2:  [88 %-100 %] 96 % (07/08 0900) Weight change:  Last BM Date: 06/14/16  Intake/Output from previous day: 07/07 0701 - 07/08 0700 In: 960 [P.O.:960] Out: 2950 [Urine:2950]  PHYSICAL EXAM General appearance: alert, no distress and moderately obese Resp: diminished breath sounds bilaterally and rhonchi bilaterally Cardio: S1, S2 normal GI: soft, non-tender; bowel sounds normal; no masses,  no organomegaly Extremities: edema 2++ leg edema  Lab Results:  Results for orders placed or performed during the hospital encounter of 06/11/16 (from the past 48 hour(s))  Glucose, capillary     Status: Abnormal   Collection Time: 06/12/16 11:36 AM  Result Value Ref Range   Glucose-Capillary 154 (H) 65 - 99 mg/dL   Comment 1 Notify RN    Comment 2 Document in Chart   Glucose, capillary     Status: Abnormal   Collection Time: 06/12/16  4:17 PM  Result Value Ref Range   Glucose-Capillary 115 (H) 65 - 99 mg/dL   Comment 1 Notify RN    Comment 2 Document in Chart   Glucose, capillary     Status: Abnormal   Collection Time: 06/12/16  8:05 PM  Result Value Ref Range   Glucose-Capillary 175 (H) 65 - 99 mg/dL  Glucose, capillary     Status: Abnormal   Collection Time: 06/13/16 12:01 AM  Result Value Ref Range   Glucose-Capillary 169 (H) 65 - 99 mg/dL  Basic metabolic panel     Status: Abnormal   Collection Time: 06/13/16  4:07 AM  Result Value Ref Range   Sodium 132 (L) 135 - 145 mmol/L   Potassium 4.8 3.5 - 5.1 mmol/L   Chloride 98 (L) 101 - 111 mmol/L   CO2 25 22 - 32  mmol/L   Glucose, Bld 113 (H) 65 - 99 mg/dL   BUN 74 (H) 6 - 20 mg/dL   Creatinine, Ser 2.32 (H) 0.61 - 1.24 mg/dL   Calcium 8.6 (L) 8.9 - 10.3 mg/dL   GFR calc non Af Amer 27 (L) >60 mL/min   GFR calc Af Amer 31 (L) >60 mL/min    Comment: (NOTE) The eGFR has been calculated using the CKD EPI equation. This calculation has not been validated in all clinical situations. eGFR's persistently <60 mL/min signify possible Chronic Kidney Disease.    Anion gap 9 5 - 15  Glucose, capillary     Status: Abnormal   Collection Time: 06/13/16  4:54 AM  Result Value Ref Range   Glucose-Capillary 155 (H) 65 - 99 mg/dL  Glucose, capillary     Status: None   Collection Time: 06/13/16  7:53 AM  Result Value Ref Range   Glucose-Capillary 93 65 - 99 mg/dL  Glucose, capillary     Status: Abnormal   Collection Time: 06/13/16 11:25 AM  Result Value Ref Range   Glucose-Capillary 132 (H) 65 - 99 mg/dL  Glucose, capillary     Status: Abnormal   Collection Time: 06/13/16  5:23 PM  Result Value Ref Range   Glucose-Capillary 217 (H) 65 - 99 mg/dL  Glucose,  capillary     Status: Abnormal   Collection Time: 06/13/16  8:23 PM  Result Value Ref Range   Glucose-Capillary 125 (H) 65 - 99 mg/dL  Glucose, capillary     Status: Abnormal   Collection Time: 06/14/16 12:22 AM  Result Value Ref Range   Glucose-Capillary 166 (H) 65 - 99 mg/dL   Comment 1 Notify RN    Comment 2 Document in Chart   Glucose, capillary     Status: Abnormal   Collection Time: 06/14/16  4:52 AM  Result Value Ref Range   Glucose-Capillary 147 (H) 65 - 99 mg/dL   Comment 1 Notify RN    Comment 2 Document in Chart   Basic metabolic panel     Status: Abnormal   Collection Time: 06/14/16  5:23 AM  Result Value Ref Range   Sodium 134 (L) 135 - 145 mmol/L   Potassium 4.8 3.5 - 5.1 mmol/L   Chloride 100 (L) 101 - 111 mmol/L   CO2 24 22 - 32 mmol/L   Glucose, Bld 114 (H) 65 - 99 mg/dL   BUN 81 (H) 6 - 20 mg/dL   Creatinine, Ser 2.47 (H)  0.61 - 1.24 mg/dL   Calcium 8.5 (L) 8.9 - 10.3 mg/dL   GFR calc non Af Amer 25 (L) >60 mL/min   GFR calc Af Amer 29 (L) >60 mL/min    Comment: (NOTE) The eGFR has been calculated using the CKD EPI equation. This calculation has not been validated in all clinical situations. eGFR's persistently <60 mL/min signify possible Chronic Kidney Disease.    Anion gap 10 5 - 15  Glucose, capillary     Status: None   Collection Time: 06/14/16  7:50 AM  Result Value Ref Range   Glucose-Capillary 76 65 - 99 mg/dL   Comment 1 Notify RN    Comment 2 Document in Chart     ABGS No results for input(s): PHART, PO2ART, TCO2, HCO3 in the last 72 hours.  Invalid input(s): PCO2 CULTURES Recent Results (from the past 240 hour(s))  MRSA PCR Screening     Status: None   Collection Time: 06/11/16  8:00 AM  Result Value Ref Range Status   MRSA by PCR NEGATIVE NEGATIVE Final    Comment:        The GeneXpert MRSA Assay (FDA approved for NASAL specimens only), is one component of a comprehensive MRSA colonization surveillance program. It is not intended to diagnose MRSA infection nor to guide or monitor treatment for MRSA infections.    Studies/Results: No results found.  Medications: I have reviewed the patient's current medications.  Assesment:   Principal Problem:   Acute exacerbation of CHF (congestive heart failure) (HCC) Active Problems:   Sleep apnea   Hypothyroid   Panhypopituitarism (HCC)   Moderate aortic regurgitation   Seizure disorder (HCC)   Diabetes ([place])   Essential hypertension   Morbid obesity due to excess calories (HCC)   Elevated troponin   COPD with exacerbation (HCC)   CKD (chronic kidney disease) stage 4, GFR 15-29 ml/min (HCC)   Acute on chronic congestive heart failure (HCC) low TSH   Plan:  Medications reviewed Will taper IV steroid Will decrease lasix to BID Will monitor BMP Continue BIPAP as needed    LOS: 3 days   Isidore Margraf 06/14/2016,  10:05 AM

## 2016-06-14 NOTE — Progress Notes (Signed)
Pt a/o.vss. Up sitting in recliner. No complaints of any distress. Report given to B.Administrator. Pt to be transferred to dept 300 via wheelchair.

## 2016-06-14 NOTE — Progress Notes (Signed)
Subjective: He says he feels better. He was cold in his room last night. His breathing is okay. He did not use CPAP or BiPAP last night but was on oxygen  Objective: Vital signs in last 24 hours: Temp:  [97.2 F (36.2 C)-98.6 F (37 C)] 98.2 F (36.8 C) (07/08 0734) Pulse Rate:  [67-92] 82 (07/08 0700) Resp:  [14-22] 19 (07/08 0700) BP: (116-147)/(52-70) 127/62 mmHg (07/08 0700) SpO2:  [88 %-100 %] 100 % (07/08 0753) Weight change:  Last BM Date: 06/11/16  Intake/Output from previous day: 07/07 0701 - 07/08 0700 In: 960 [P.O.:960] Out: 2950 [Urine:2950]  PHYSICAL EXAM General appearance: alert, cooperative, no distress and morbidly obese Resp: rhonchi bilaterally Cardio: His heart murmur is unchanged. I do not hear a gallop GI: soft, non-tender; bowel sounds normal; no masses,  no organomegaly Extremities: Chronic venous stasis and trace edema  Lab Results:  Results for orders placed or performed during the hospital encounter of 06/11/16 (from the past 48 hour(s))  Glucose, capillary     Status: Abnormal   Collection Time: 06/12/16 11:36 AM  Result Value Ref Range   Glucose-Capillary 154 (H) 65 - 99 mg/dL   Comment 1 Notify RN    Comment 2 Document in Chart   Glucose, capillary     Status: Abnormal   Collection Time: 06/12/16  4:17 PM  Result Value Ref Range   Glucose-Capillary 115 (H) 65 - 99 mg/dL   Comment 1 Notify RN    Comment 2 Document in Chart   Glucose, capillary     Status: Abnormal   Collection Time: 06/12/16  8:05 PM  Result Value Ref Range   Glucose-Capillary 175 (H) 65 - 99 mg/dL  Glucose, capillary     Status: Abnormal   Collection Time: 06/13/16 12:01 AM  Result Value Ref Range   Glucose-Capillary 169 (H) 65 - 99 mg/dL  Basic metabolic panel     Status: Abnormal   Collection Time: 06/13/16  4:07 AM  Result Value Ref Range   Sodium 132 (L) 135 - 145 mmol/L   Potassium 4.8 3.5 - 5.1 mmol/L   Chloride 98 (L) 101 - 111 mmol/L   CO2 25 22 - 32  mmol/L   Glucose, Bld 113 (H) 65 - 99 mg/dL   BUN 74 (H) 6 - 20 mg/dL   Creatinine, Ser 2.32 (H) 0.61 - 1.24 mg/dL   Calcium 8.6 (L) 8.9 - 10.3 mg/dL   GFR calc non Af Amer 27 (L) >60 mL/min   GFR calc Af Amer 31 (L) >60 mL/min    Comment: (NOTE) The eGFR has been calculated using the CKD EPI equation. This calculation has not been validated in all clinical situations. eGFR's persistently <60 mL/min signify possible Chronic Kidney Disease.    Anion gap 9 5 - 15  Glucose, capillary     Status: Abnormal   Collection Time: 06/13/16  4:54 AM  Result Value Ref Range   Glucose-Capillary 155 (H) 65 - 99 mg/dL  Glucose, capillary     Status: None   Collection Time: 06/13/16  7:53 AM  Result Value Ref Range   Glucose-Capillary 93 65 - 99 mg/dL  Glucose, capillary     Status: Abnormal   Collection Time: 06/13/16 11:25 AM  Result Value Ref Range   Glucose-Capillary 132 (H) 65 - 99 mg/dL  Glucose, capillary     Status: Abnormal   Collection Time: 06/13/16  5:23 PM  Result Value Ref Range   Glucose-Capillary 217 (  H) 65 - 99 mg/dL  Glucose, capillary     Status: Abnormal   Collection Time: 06/13/16  8:23 PM  Result Value Ref Range   Glucose-Capillary 125 (H) 65 - 99 mg/dL  Glucose, capillary     Status: Abnormal   Collection Time: 06/14/16 12:22 AM  Result Value Ref Range   Glucose-Capillary 166 (H) 65 - 99 mg/dL   Comment 1 Notify RN    Comment 2 Document in Chart   Glucose, capillary     Status: Abnormal   Collection Time: 06/14/16  4:52 AM  Result Value Ref Range   Glucose-Capillary 147 (H) 65 - 99 mg/dL   Comment 1 Notify RN    Comment 2 Document in Chart   Basic metabolic panel     Status: Abnormal   Collection Time: 06/14/16  5:23 AM  Result Value Ref Range   Sodium 134 (L) 135 - 145 mmol/L   Potassium 4.8 3.5 - 5.1 mmol/L   Chloride 100 (L) 101 - 111 mmol/L   CO2 24 22 - 32 mmol/L   Glucose, Bld 114 (H) 65 - 99 mg/dL   BUN 81 (H) 6 - 20 mg/dL   Creatinine, Ser 2.47 (H)  0.61 - 1.24 mg/dL   Calcium 8.5 (L) 8.9 - 10.3 mg/dL   GFR calc non Af Amer 25 (L) >60 mL/min   GFR calc Af Amer 29 (L) >60 mL/min    Comment: (NOTE) The eGFR has been calculated using the CKD EPI equation. This calculation has not been validated in all clinical situations. eGFR's persistently <60 mL/min signify possible Chronic Kidney Disease.    Anion gap 10 5 - 15  Glucose, capillary     Status: None   Collection Time: 06/14/16  7:50 AM  Result Value Ref Range   Glucose-Capillary 76 65 - 99 mg/dL   Comment 1 Notify RN    Comment 2 Document in Chart     ABGS No results for input(s): PHART, PO2ART, TCO2, HCO3 in the last 72 hours.  Invalid input(s): PCO2 CULTURES Recent Results (from the past 240 hour(s))  MRSA PCR Screening     Status: None   Collection Time: 06/11/16  8:00 AM  Result Value Ref Range Status   MRSA by PCR NEGATIVE NEGATIVE Final    Comment:        The GeneXpert MRSA Assay (FDA approved for NASAL specimens only), is one component of a comprehensive MRSA colonization surveillance program. It is not intended to diagnose MRSA infection nor to guide or monitor treatment for MRSA infections.    Studies/Results: No results found.  Medications:  Prior to Admission:  Prescriptions prior to admission  Medication Sig Dispense Refill Last Dose  . ADVAIR DISKUS 250-50 MCG/DOSE AEPB Inhale 1 puff into the lungs 2 (two) times daily.   06/11/2016 at Unknown time  . albuterol (PROVENTIL) (2.5 MG/3ML) 0.083% nebulizer solution Inhale 3 mLs into the lungs every 6 (six) hours as needed. Shortness of breath/wheezing   06/11/2016 at Unknown time  . amLODipine (NORVASC) 5 MG tablet TAKE ONE TABLET BY MOUTH DAILY. 30 tablet 3 06/10/2016 at Unknown time  . budesonide-formoterol (SYMBICORT) 160-4.5 MCG/ACT inhaler Inhale 2 puffs into the lungs 2 (two) times daily.   06/11/2016 at Unknown time  . calcitRIOL (ROCALTROL) 0.5 MCG capsule Take 0.5 mcg by mouth 2 (two) times daily.    06/10/2016 at Unknown time  . cloNIDine (CATAPRES) 0.2 MG tablet TAKE (1) TABLET BY MOUTH (3) TIMES DAILY  90 tablet 6 06/10/2016 at Unknown time  . desmopressin (DDAVP) 0.2 MG tablet Take 1 tablet (0.2 mg total) by mouth 2 (two) times daily. 60 tablet 6 06/10/2016 at Unknown time  . fluticasone (FLONASE) 50 MCG/ACT nasal spray Place 2 sprays into both nostrils daily.    06/10/2016 at Unknown time  . levothyroxine (SYNTHROID, LEVOTHROID) 175 MCG tablet TAKE ONE TABLET BY MOUTH ONCE DAILY. 30 tablet 0 06/10/2016 at Unknown time  . pantoprazole (PROTONIX) 40 MG tablet Take 1 tablet (40 mg total) by mouth 2 (two) times daily before a meal. (Patient taking differently: Take 40 mg by mouth 2 (two) times daily. ) 60 tablet 3 06/10/2016 at Unknown time  . phenytoin (DILANTIN) 100 MG ER capsule Take 200-300 mg by mouth daily. Takes 200 mg on Monday and Thursdsay. On all other days of the week takes 300 mg.   06/10/2016 at Unknown time  . potassium chloride SA (K-DUR,KLOR-CON) 20 MEQ tablet Take 20 mEq by mouth 2 (two) times daily.   06/10/2016 at Unknown time  . predniSONE (DELTASONE) 20 MG tablet Take 2 tablets (40 mg total) by mouth daily. 10 tablet 0 06/10/2016 at Unknown time  . simvastatin (ZOCOR) 20 MG tablet Take 20 mg by mouth daily.    06/10/2016 at Unknown time  . tiotropium (SPIRIVA) 18 MCG inhalation capsule Place 18 mcg into inhaler and inhale daily.   06/11/2016 at Unknown time  . torsemide (DEMADEX) 20 MG tablet Take 2 tablets (40 mg total) by mouth 2 (two) times daily. 120 tablet 6 06/10/2016 at Unknown time  . albuterol (PROVENTIL HFA;VENTOLIN HFA) 108 (90 Base) MCG/ACT inhaler Inhale 2 puffs into the lungs every 4 (four) hours as needed for wheezing or shortness of breath. 1 Inhaler 3 unknown  . polyethylene glycol powder (GLYCOLAX/MIRALAX) powder Take 17 g by mouth daily. May decrease to 17 g three times a week if needed. (Patient taking differently: Take 17 g by mouth daily. May decrease to 17 g three times a week  if needed.) 255 g 3 unknown  . Vitamin D, Ergocalciferol, (DRISDOL) 50000 UNITS CAPS capsule Take 1 capsule (50,000 Units total) by mouth every 7 (seven) days. Monday 12 capsule 0 unknown   Scheduled: . albuterol  2.5 mg Nebulization Q6H  . aspirin EC  81 mg Oral Daily  . calcitRIOL  0.5 mcg Oral BID  . cloNIDine  0.2 mg Oral TID  . desmopressin  0.2 mg Oral BID  . doxycycline  100 mg Oral Q12H  . furosemide  60 mg Intravenous Q8H  . heparin  5,000 Units Subcutaneous Q8H  . insulin aspart  0-20 Units Subcutaneous Q4H  . isosorbide-hydrALAZINE  1 tablet Oral TID  . levothyroxine  175 mcg Oral QAC breakfast  . loratadine  10 mg Oral Daily  . methylPREDNISolone (SOLU-MEDROL) injection  40 mg Intravenous Q6H  . mometasone-formoterol  2 puff Inhalation BID  . pantoprazole  40 mg Oral BID  . phenytoin  200 mg Oral Once per day on Mon Thu   And  . phenytoin  300 mg Oral Once per day on Sun Tue Wed Fri Sat  . polyethylene glycol  17 g Oral Daily  . simvastatin  20 mg Oral Daily  . sodium chloride flush  3 mL Intravenous Q12H  . Vitamin D (Ergocalciferol)  50,000 Units Oral Q7 days   Continuous:  PZW:CHENIDPOEUMPN, albuterol, sodium chloride  Assesment: He has acute exacerbation of heart failure plus COPD exacerbation. He seems to  be improving. He has multiple other medical problems including panhypopituitarism aortic regurgitation seizure disorder hypertension morbid obesity and sleep apnea. [name] noncompliant with CPAP. He does seem to be getting better. Principal Problem:   Acute exacerbation of CHF (congestive heart failure) (HCC) Active Problems:   Sleep apnea   Hypothyroid   Panhypopituitarism (HCC)   Moderate aortic regurgitation   Seizure disorder (HCC)   Diabetes ([place])   Essential hypertension   Morbid obesity due to excess calories (HCC)   Elevated troponin   COPD with exacerbation (HCC)   CKD (chronic kidney disease) stage 4, GFR 15-29 ml/min (HCC)   Acute on chronic  congestive heart failure ([place])    Plan: Continue current treatments    LOS: 3 days   Antoinett Dorman L 06/14/2016, 8:32 AM

## 2016-06-15 LAB — GLUCOSE, CAPILLARY
GLUCOSE-CAPILLARY: 117 mg/dL — AB (ref 65–99)
GLUCOSE-CAPILLARY: 134 mg/dL — AB (ref 65–99)
GLUCOSE-CAPILLARY: 147 mg/dL — AB (ref 65–99)
Glucose-Capillary: 101 mg/dL — ABNORMAL HIGH (ref 65–99)
Glucose-Capillary: 81 mg/dL (ref 65–99)
Glucose-Capillary: 98 mg/dL (ref 65–99)

## 2016-06-15 LAB — BASIC METABOLIC PANEL
Anion gap: 9 (ref 5–15)
BUN: 79 mg/dL — ABNORMAL HIGH (ref 6–20)
CALCIUM: 8.9 mg/dL (ref 8.9–10.3)
CO2: 26 mmol/L (ref 22–32)
CREATININE: 2.45 mg/dL — AB (ref 0.61–1.24)
Chloride: 98 mmol/L — ABNORMAL LOW (ref 101–111)
GFR calc Af Amer: 29 mL/min — ABNORMAL LOW (ref >60)
GFR calc non Af Amer: 25 mL/min — ABNORMAL LOW (ref >60)
GLUCOSE: 85 mg/dL (ref 65–99)
Potassium: 4.6 mmol/L (ref 3.5–5.1)
Sodium: 133 mmol/L — ABNORMAL LOW (ref 135–145)

## 2016-06-15 MED ORDER — ALUM & MAG HYDROXIDE-SIMETH 200-200-20 MG/5ML PO SUSP
30.0000 mL | Freq: Four times a day (QID) | ORAL | Status: DC | PRN
  Administered 2016-06-15: 30 mL via ORAL
  Filled 2016-06-15: qty 30

## 2016-06-15 MED ORDER — PREDNISONE 20 MG PO TABS
40.0000 mg | ORAL_TABLET | Freq: Every day | ORAL | Status: DC
  Administered 2016-06-15 – 2016-06-17 (×3): 40 mg via ORAL
  Filled 2016-06-15 (×3): qty 2

## 2016-06-15 NOTE — Progress Notes (Signed)
Subjective: Patient feels better. His breathing is improving. [name] walking with waklker. No new complaint. Objective: Vital signs in last 24 hours: Temp:  [97.3 F (36.3 C)-99.1 F (37.3 C)] 99.1 F (37.3 C) (07/09 0617) Pulse Rate:  [78-88] 87 (07/09 0617) Resp:  [15-22] 20 (07/09 0617) BP: (119-159)/(45-107) 132/57 mmHg (07/09 0617) SpO2:  [94 %-100 %] 99 % (07/09 0617) Weight:  [116.121 kg (256 lb)] 116.121 kg (256 lb) (07/08 1404) Weight change:  Last BM Date: 06/14/16  Intake/Output from previous day: 07/08 0701 - 07/09 0700 In: 480 [P.O.:480] Out: 2575 [Urine:2575]  PHYSICAL EXAM General appearance: alert, no distress and moderately obese Resp: diminished breath sounds bilaterally and rhonchi bilaterally Cardio: S1, S2 normal GI: soft, non-tender; bowel sounds normal; no masses,  no organomegaly Extremities: edema 2++ leg edema  Lab Results:  Results for orders placed or performed during the hospital encounter of 06/11/16 (from the past 48 hour(s))  Glucose, capillary     Status: None   Collection Time: 06/13/16  7:53 AM  Result Value Ref Range   Glucose-Capillary 93 65 - 99 mg/dL  Glucose, capillary     Status: Abnormal   Collection Time: 06/13/16 11:25 AM  Result Value Ref Range   Glucose-Capillary 132 (H) 65 - 99 mg/dL  Glucose, capillary     Status: Abnormal   Collection Time: 06/13/16  5:23 PM  Result Value Ref Range   Glucose-Capillary 217 (H) 65 - 99 mg/dL  Glucose, capillary     Status: Abnormal   Collection Time: 06/13/16  8:23 PM  Result Value Ref Range   Glucose-Capillary 125 (H) 65 - 99 mg/dL  Glucose, capillary     Status: Abnormal   Collection Time: 06/14/16 12:22 AM  Result Value Ref Range   Glucose-Capillary 166 (H) 65 - 99 mg/dL   Comment 1 Notify RN    Comment 2 Document in Chart   Glucose, capillary     Status: Abnormal   Collection Time: 06/14/16  4:52 AM  Result Value Ref Range   Glucose-Capillary 147 (H) 65 - 99 mg/dL   Comment 1  Notify RN    Comment 2 Document in Chart   Basic metabolic panel     Status: Abnormal   Collection Time: 06/14/16  5:23 AM  Result Value Ref Range   Sodium 134 (L) 135 - 145 mmol/L   Potassium 4.8 3.5 - 5.1 mmol/L   Chloride 100 (L) 101 - 111 mmol/L   CO2 24 22 - 32 mmol/L   Glucose, Bld 114 (H) 65 - 99 mg/dL   BUN 81 (H) 6 - 20 mg/dL   Creatinine, Ser 2.47 (H) 0.61 - 1.24 mg/dL   Calcium 8.5 (L) 8.9 - 10.3 mg/dL   GFR calc non Af Amer 25 (L) >60 mL/min   GFR calc Af Amer 29 (L) >60 mL/min    Comment: (NOTE) The eGFR has been calculated using the CKD EPI equation. This calculation has not been validated in all clinical situations. eGFR's persistently <60 mL/min signify possible Chronic Kidney Disease.    Anion gap 10 5 - 15  Glucose, capillary     Status: None   Collection Time: 06/14/16  7:50 AM  Result Value Ref Range   Glucose-Capillary 76 65 - 99 mg/dL   Comment 1 Notify RN    Comment 2 Document in Chart   Glucose, capillary     Status: Abnormal   Collection Time: 06/14/16 11:41 AM  Result Value Ref Range  Glucose-Capillary 108 (H) 65 - 99 mg/dL   Comment 1 Notify RN    Comment 2 Document in Chart   Glucose, capillary     Status: Abnormal   Collection Time: 06/14/16  4:38 PM  Result Value Ref Range   Glucose-Capillary 152 (H) 65 - 99 mg/dL  Glucose, capillary     Status: Abnormal   Collection Time: 06/14/16  8:07 PM  Result Value Ref Range   Glucose-Capillary 150 (H) 65 - 99 mg/dL   Comment 1 Notify RN    Comment 2 Document in Chart   Glucose, capillary     Status: None   Collection Time: 06/15/16 12:03 AM  Result Value Ref Range   Glucose-Capillary 98 65 - 99 mg/dL   Comment 1 Notify RN    Comment 2 Document in Chart   Glucose, capillary     Status: Abnormal   Collection Time: 06/15/16  4:17 AM  Result Value Ref Range   Glucose-Capillary 101 (H) 65 - 99 mg/dL    ABGS No results for input(s): PHART, PO2ART, TCO2, HCO3 in the last 72 hours.  Invalid  input(s): PCO2 CULTURES Recent Results (from the past 240 hour(s))  MRSA PCR Screening     Status: None   Collection Time: 06/11/16  8:00 AM  Result Value Ref Range Status   MRSA by PCR NEGATIVE NEGATIVE Final    Comment:        The GeneXpert MRSA Assay (FDA approved for NASAL specimens only), is one component of a comprehensive MRSA colonization surveillance program. It is not intended to diagnose MRSA infection nor to guide or monitor treatment for MRSA infections.    Studies/Results: No results found.  Medications: I have reviewed the patient's current medications.  Assesment:   Principal Problem:   Acute exacerbation of CHF (congestive heart failure) (HCC) Active Problems:   Sleep apnea   Hypothyroid   Panhypopituitarism (HCC)   Moderate aortic regurgitation   Seizure disorder (HCC)   Diabetes ([place])   Essential hypertension   Morbid obesity due to excess calories (HCC)   Elevated troponin   COPD with exacerbation (HCC)   CKD (chronic kidney disease) stage 4, GFR 15-29 ml/min (HCC)   Acute on chronic congestive heart failure (HCC) low TSH   Plan:  Medications reviewed Will change steroid to po Will decrease lasix to BID Will monitor BMP Continue BIPAP as needed    LOS: 4 days   Cooper Moroney 06/15/2016, 7:17 AM

## 2016-06-15 NOTE — Progress Notes (Signed)
Subjective: He's improving. He says he is not short of breath. No chest pain.  Objective: Vital signs in last 24 hours: Temp:  [97.3 F (36.3 C)-99.1 F (37.3 C)] 99.1 F (37.3 C) (07/09 0617) Pulse Rate:  [78-88] 87 (07/09 0617) Resp:  [15-22] 20 (07/09 0617) BP: (119-159)/(45-107) 132/57 mmHg (07/09 0617) SpO2:  [94 %-100 %] 96 % (07/09 0728) Weight:  [116.121 kg (256 lb)] 116.121 kg (256 lb) (07/08 1404) Weight change:  Last BM Date: 06/14/16  Intake/Output from previous day: 07/08 0701 - 07/09 0700 In: 480 [P.O.:480] Out: 2575 [Urine:2575]  PHYSICAL EXAM General appearance: alert, cooperative, mild distress and morbidly obese Resp: clear to auscultation bilaterally Cardio: regular rate and rhythm, S1, S2 normal, no murmur, click, rub or gallop GI: soft, non-tender; bowel sounds normal; no masses,  no organomegaly Extremities: venous stasis dermatitis noted  Lab Results:  Results for orders placed or performed during the hospital encounter of 06/11/16 (from the past 48 hour(s))  Glucose, capillary     Status: None   Collection Time: 06/13/16  7:53 AM  Result Value Ref Range   Glucose-Capillary 93 65 - 99 mg/dL  Glucose, capillary     Status: Abnormal   Collection Time: 06/13/16 11:25 AM  Result Value Ref Range   Glucose-Capillary 132 (H) 65 - 99 mg/dL  Glucose, capillary     Status: Abnormal   Collection Time: 06/13/16  5:23 PM  Result Value Ref Range   Glucose-Capillary 217 (H) 65 - 99 mg/dL  Glucose, capillary     Status: Abnormal   Collection Time: 06/13/16  8:23 PM  Result Value Ref Range   Glucose-Capillary 125 (H) 65 - 99 mg/dL  Glucose, capillary     Status: Abnormal   Collection Time: 06/14/16 12:22 AM  Result Value Ref Range   Glucose-Capillary 166 (H) 65 - 99 mg/dL   Comment 1 Notify RN    Comment 2 Document in Chart   Glucose, capillary     Status: Abnormal   Collection Time: 06/14/16  4:52 AM  Result Value Ref Range   Glucose-Capillary 147 (H)  65 - 99 mg/dL   Comment 1 Notify RN    Comment 2 Document in Chart   Basic metabolic panel     Status: Abnormal   Collection Time: 06/14/16  5:23 AM  Result Value Ref Range   Sodium 134 (L) 135 - 145 mmol/L   Potassium 4.8 3.5 - 5.1 mmol/L   Chloride 100 (L) 101 - 111 mmol/L   CO2 24 22 - 32 mmol/L   Glucose, Bld 114 (H) 65 - 99 mg/dL   BUN 81 (H) 6 - 20 mg/dL   Creatinine, Ser 2.47 (H) 0.61 - 1.24 mg/dL   Calcium 8.5 (L) 8.9 - 10.3 mg/dL   GFR calc non Af Amer 25 (L) >60 mL/min   GFR calc Af Amer 29 (L) >60 mL/min    Comment: (NOTE) The eGFR has been calculated using the CKD EPI equation. This calculation has not been validated in all clinical situations. eGFR's persistently <60 mL/min signify possible Chronic Kidney Disease.    Anion gap 10 5 - 15  Glucose, capillary     Status: None   Collection Time: 06/14/16  7:50 AM  Result Value Ref Range   Glucose-Capillary 76 65 - 99 mg/dL   Comment 1 Notify RN    Comment 2 Document in Chart   Glucose, capillary     Status: Abnormal   Collection Time: 06/14/16  11:41 AM  Result Value Ref Range   Glucose-Capillary 108 (H) 65 - 99 mg/dL   Comment 1 Notify RN    Comment 2 Document in Chart   Glucose, capillary     Status: Abnormal   Collection Time: 06/14/16  4:38 PM  Result Value Ref Range   Glucose-Capillary 152 (H) 65 - 99 mg/dL  Glucose, capillary     Status: Abnormal   Collection Time: 06/14/16  8:07 PM  Result Value Ref Range   Glucose-Capillary 150 (H) 65 - 99 mg/dL   Comment 1 Notify RN    Comment 2 Document in Chart   Glucose, capillary     Status: None   Collection Time: 06/15/16 12:03 AM  Result Value Ref Range   Glucose-Capillary 98 65 - 99 mg/dL   Comment 1 Notify RN    Comment 2 Document in Chart   Glucose, capillary     Status: Abnormal   Collection Time: 06/15/16  4:17 AM  Result Value Ref Range   Glucose-Capillary 101 (H) 65 - 99 mg/dL  Basic metabolic panel     Status: Abnormal   Collection Time: 06/15/16   5:44 AM  Result Value Ref Range   Sodium 133 (L) 135 - 145 mmol/L   Potassium 4.6 3.5 - 5.1 mmol/L   Chloride 98 (L) 101 - 111 mmol/L   CO2 26 22 - 32 mmol/L   Glucose, Bld 85 65 - 99 mg/dL   BUN 79 (H) 6 - 20 mg/dL   Creatinine, Ser 2.45 (H) 0.61 - 1.24 mg/dL   Calcium 8.9 8.9 - 10.3 mg/dL   GFR calc non Af Amer 25 (L) >60 mL/min   GFR calc Af Amer 29 (L) >60 mL/min    Comment: (NOTE) The eGFR has been calculated using the CKD EPI equation. This calculation has not been validated in all clinical situations. eGFR's persistently <60 mL/min signify possible Chronic Kidney Disease.    Anion gap 9 5 - 15    ABGS No results for input(s): PHART, PO2ART, TCO2, HCO3 in the last 72 hours.  Invalid input(s): PCO2 CULTURES Recent Results (from the past 240 hour(s))  MRSA PCR Screening     Status: None   Collection Time: 06/11/16  8:00 AM  Result Value Ref Range Status   MRSA by PCR NEGATIVE NEGATIVE Final    Comment:        The GeneXpert MRSA Assay (FDA approved for NASAL specimens only), is one component of a comprehensive MRSA colonization surveillance program. It is not intended to diagnose MRSA infection nor to guide or monitor treatment for MRSA infections.    Studies/Results: No results found.  Medications:  Prior to Admission:  Prescriptions prior to admission  Medication Sig Dispense Refill Last Dose  . ADVAIR DISKUS 250-50 MCG/DOSE AEPB Inhale 1 puff into the lungs 2 (two) times daily.   06/11/2016 at Unknown time  . albuterol (PROVENTIL) (2.5 MG/3ML) 0.083% nebulizer solution Inhale 3 mLs into the lungs every 6 (six) hours as needed. Shortness of breath/wheezing   06/11/2016 at Unknown time  . amLODipine (NORVASC) 5 MG tablet TAKE ONE TABLET BY MOUTH DAILY. 30 tablet 3 06/10/2016 at Unknown time  . budesonide-formoterol (SYMBICORT) 160-4.5 MCG/ACT inhaler Inhale 2 puffs into the lungs 2 (two) times daily.   06/11/2016 at Unknown time  . calcitRIOL (ROCALTROL) 0.5 MCG  capsule Take 0.5 mcg by mouth 2 (two) times daily.   06/10/2016 at Unknown time  . cloNIDine (CATAPRES) 0.2 MG  tablet TAKE (1) TABLET BY MOUTH (3) TIMES DAILY 90 tablet 6 06/10/2016 at Unknown time  . desmopressin (DDAVP) 0.2 MG tablet Take 1 tablet (0.2 mg total) by mouth 2 (two) times daily. 60 tablet 6 06/10/2016 at Unknown time  . fluticasone (FLONASE) 50 MCG/ACT nasal spray Place 2 sprays into both nostrils daily.    06/10/2016 at Unknown time  . levothyroxine (SYNTHROID, LEVOTHROID) 175 MCG tablet TAKE ONE TABLET BY MOUTH ONCE DAILY. 30 tablet 0 06/10/2016 at Unknown time  . pantoprazole (PROTONIX) 40 MG tablet Take 1 tablet (40 mg total) by mouth 2 (two) times daily before a meal. (Patient taking differently: Take 40 mg by mouth 2 (two) times daily. ) 60 tablet 3 06/10/2016 at Unknown time  . phenytoin (DILANTIN) 100 MG ER capsule Take 200-300 mg by mouth daily. Takes 200 mg on Monday and Thursdsay. On all other days of the week takes 300 mg.   06/10/2016 at Unknown time  . potassium chloride SA (K-DUR,KLOR-CON) 20 MEQ tablet Take 20 mEq by mouth 2 (two) times daily.   06/10/2016 at Unknown time  . predniSONE (DELTASONE) 20 MG tablet Take 2 tablets (40 mg total) by mouth daily. 10 tablet 0 06/10/2016 at Unknown time  . simvastatin (ZOCOR) 20 MG tablet Take 20 mg by mouth daily.    06/10/2016 at Unknown time  . tiotropium (SPIRIVA) 18 MCG inhalation capsule Place 18 mcg into inhaler and inhale daily.   06/11/2016 at Unknown time  . torsemide (DEMADEX) 20 MG tablet Take 2 tablets (40 mg total) by mouth 2 (two) times daily. 120 tablet 6 06/10/2016 at Unknown time  . albuterol (PROVENTIL HFA;VENTOLIN HFA) 108 (90 Base) MCG/ACT inhaler Inhale 2 puffs into the lungs every 4 (four) hours as needed for wheezing or shortness of breath. 1 Inhaler 3 unknown  . polyethylene glycol powder (GLYCOLAX/MIRALAX) powder Take 17 g by mouth daily. May decrease to 17 g three times a week if needed. (Patient taking differently: Take 17 g by  mouth daily. May decrease to 17 g three times a week if needed.) 255 g 3 unknown  . Vitamin D, Ergocalciferol, (DRISDOL) 50000 UNITS CAPS capsule Take 1 capsule (50,000 Units total) by mouth every 7 (seven) days. Monday 12 capsule 0 unknown   Scheduled: . albuterol  2.5 mg Nebulization Q6H  . aspirin EC  81 mg Oral Daily  . calcitRIOL  0.5 mcg Oral BID  . cloNIDine  0.2 mg Oral TID  . desmopressin  0.2 mg Oral BID  . doxycycline  100 mg Oral Q12H  . furosemide  40 mg Intravenous Q12H  . heparin  5,000 Units Subcutaneous Q8H  . insulin aspart  0-20 Units Subcutaneous Q4H  . isosorbide-hydrALAZINE  1 tablet Oral TID  . levothyroxine  175 mcg Oral QAC breakfast  . loratadine  10 mg Oral Daily  . mometasone-formoterol  2 puff Inhalation BID  . pantoprazole  40 mg Oral BID  . phenytoin  200 mg Oral Once per day on Mon Thu   And  . phenytoin  300 mg Oral Once per day on Sun Tue Wed Fri Sat  . polyethylene glycol  17 g Oral Daily  . predniSONE  40 mg Oral Q breakfast  . simvastatin  20 mg Oral Daily  . sodium chloride flush  3 mL Intravenous Q12H  . Vitamin D (Ergocalciferol)  50,000 Units Oral Q7 days   Continuous:  QPR:[id number], albuterol, alum & mag hydroxide-simeth, sodium chloride  Assesment: He  was admitted with acute exacerbation of heart failure and COPD. [name] improving. He has acute respiratory failure because of that. He required BiPAP briefly. Principal Problem:   Acute exacerbation of CHF (congestive heart failure) (HCC) Active Problems:   Sleep apnea   Hypothyroid   Panhypopituitarism (HCC)   Moderate aortic regurgitation   Seizure disorder (HCC)   Diabetes ([place])   Essential hypertension   Morbid obesity due to excess calories (HCC)   Elevated troponin   COPD with exacerbation (HCC)   CKD (chronic kidney disease) stage 4, GFR 15-29 ml/min (HCC)   Acute on chronic congestive heart failure ([place])    Plan: Continue current treatments. I will plan to follow  somewhat more peripherally as it appears that he is approaching discharge    LOS: 4 days   Andyn Sales L 06/15/2016, 7:46 AM

## 2016-06-16 DIAGNOSIS — N184 Chronic kidney disease, stage 4 (severe): Secondary | ICD-10-CM

## 2016-06-16 LAB — BASIC METABOLIC PANEL
ANION GAP: 8 (ref 5–15)
BUN: 85 mg/dL — AB (ref 6–20)
CO2: 24 mmol/L (ref 22–32)
Calcium: 8.6 mg/dL — ABNORMAL LOW (ref 8.9–10.3)
Chloride: 101 mmol/L (ref 101–111)
Creatinine, Ser: 2.49 mg/dL — ABNORMAL HIGH (ref 0.61–1.24)
GFR calc Af Amer: 28 mL/min — ABNORMAL LOW (ref >60)
GFR, EST NON AFRICAN AMERICAN: 24 mL/min — AB (ref >60)
Glucose, Bld: 86 mg/dL (ref 65–99)
POTASSIUM: 4 mmol/L (ref 3.5–5.1)
SODIUM: 133 mmol/L — AB (ref 135–145)

## 2016-06-16 LAB — GLUCOSE, CAPILLARY
GLUCOSE-CAPILLARY: 113 mg/dL — AB (ref 65–99)
GLUCOSE-CAPILLARY: 118 mg/dL — AB (ref 65–99)
Glucose-Capillary: 137 mg/dL — ABNORMAL HIGH (ref 65–99)
Glucose-Capillary: 72 mg/dL (ref 65–99)
Glucose-Capillary: 110 mg/dL — ABNORMAL HIGH (ref 65–99)
Glucose-Capillary: 151 mg/dL — ABNORMAL HIGH (ref 65–99)

## 2016-06-16 NOTE — Progress Notes (Signed)
Subjective: Patient feels better. His breathing has improved. His leg edema is subsiding. Patient complains that he has difficulty to walk.. Objective: Vital signs in last 24 hours: Temp:  [98.7 F (37.1 C)-98.8 F (37.1 C)] 98.7 F (37.1 C) (07/10 0407) Pulse Rate:  [77-85] 83 (07/10 0407) Resp:  [20] 20 (07/10 0407) BP: (133-137)/(48-55) 134/55 mmHg (07/10 0407) SpO2:  [92 %-97 %] 93 % (07/10 0806) Weight change:  Last BM Date: 06/15/16  Intake/Output from previous day: 07/09 0701 - 07/10 0700 In: 720 [P.O.:720] Out: 2700 [Urine:2700]  PHYSICAL EXAM General appearance: alert, no distress and moderately obese Resp: diminished breath sounds bilaterally and rhonchi bilaterally Cardio: S1, S2 normal GI: soft, non-tender; bowel sounds normal; no masses,  no organomegaly Extremities: edema 2++ leg edema  Lab Results:  Results for orders placed or performed during the hospital encounter of 06/11/16 (from the past 48 hour(s))  Glucose, capillary     Status: Abnormal   Collection Time: 06/14/16 11:41 AM  Result Value Ref Range   Glucose-Capillary 108 (H) 65 - 99 mg/dL   Comment 1 Notify RN    Comment 2 Document in Chart   Glucose, capillary     Status: Abnormal   Collection Time: 06/14/16  4:38 PM  Result Value Ref Range   Glucose-Capillary 152 (H) 65 - 99 mg/dL  Glucose, capillary     Status: Abnormal   Collection Time: 06/14/16  8:07 PM  Result Value Ref Range   Glucose-Capillary 150 (H) 65 - 99 mg/dL   Comment 1 Notify RN    Comment 2 Document in Chart   Glucose, capillary     Status: None   Collection Time: 06/15/16 12:03 AM  Result Value Ref Range   Glucose-Capillary 98 65 - 99 mg/dL   Comment 1 Notify RN    Comment 2 Document in Chart   Glucose, capillary     Status: Abnormal   Collection Time: 06/15/16  4:17 AM  Result Value Ref Range   Glucose-Capillary 101 (H) 65 - 99 mg/dL  Basic metabolic panel     Status: Abnormal   Collection Time: 06/15/16  5:44 AM   Result Value Ref Range   Sodium 133 (L) 135 - 145 mmol/L   Potassium 4.6 3.5 - 5.1 mmol/L   Chloride 98 (L) 101 - 111 mmol/L   CO2 26 22 - 32 mmol/L   Glucose, Bld 85 65 - 99 mg/dL   BUN 79 (H) 6 - 20 mg/dL   Creatinine, Ser 2.45 (H) 0.61 - 1.24 mg/dL   Calcium 8.9 8.9 - 10.3 mg/dL   GFR calc non Af Amer 25 (L) >60 mL/min   GFR calc Af Amer 29 (L) >60 mL/min    Comment: (NOTE) The eGFR has been calculated using the CKD EPI equation. This calculation has not been validated in all clinical situations. eGFR's persistently <60 mL/min signify possible Chronic Kidney Disease.    Anion gap 9 5 - 15  Glucose, capillary     Status: None   Collection Time: 06/15/16  7:23 AM  Result Value Ref Range   Glucose-Capillary 81 65 - 99 mg/dL  Glucose, capillary     Status: Abnormal   Collection Time: 06/15/16 11:32 AM  Result Value Ref Range   Glucose-Capillary 117 (H) 65 - 99 mg/dL  Glucose, capillary     Status: Abnormal   Collection Time: 06/15/16  4:17 PM  Result Value Ref Range   Glucose-Capillary 134 (H) 65 - 99 mg/dL  Glucose, capillary     Status: Abnormal   Collection Time: 06/15/16  8:45 PM  Result Value Ref Range   Glucose-Capillary 147 (H) 65 - 99 mg/dL   Comment 1 Notify RN    Comment 2 Document in Chart   Glucose, capillary     Status: Abnormal   Collection Time: 06/16/16 12:43 AM  Result Value Ref Range   Glucose-Capillary 118 (H) 65 - 99 mg/dL  Glucose, capillary     Status: Abnormal   Collection Time: 06/16/16  4:04 AM  Result Value Ref Range   Glucose-Capillary 137 (H) 65 - 99 mg/dL   Comment 1 Notify RN    Comment 2 Document in Chart   Basic metabolic panel     Status: Abnormal   Collection Time: 06/16/16  5:40 AM  Result Value Ref Range   Sodium 133 (L) 135 - 145 mmol/L   Potassium 4.0 3.5 - 5.1 mmol/L   Chloride 101 101 - 111 mmol/L   CO2 24 22 - 32 mmol/L   Glucose, Bld 86 65 - 99 mg/dL   BUN 85 (H) 6 - 20 mg/dL   Creatinine, Ser 2.49 (H) 0.61 - 1.24 mg/dL    Calcium 8.6 (L) 8.9 - 10.3 mg/dL   GFR calc non Af Amer 24 (L) >60 mL/min   GFR calc Af Amer 28 (L) >60 mL/min    Comment: (NOTE) The eGFR has been calculated using the CKD EPI equation. This calculation has not been validated in all clinical situations. eGFR's persistently <60 mL/min signify possible Chronic Kidney Disease.    Anion gap 8 5 - 15  Glucose, capillary     Status: None   Collection Time: 06/16/16  7:50 AM  Result Value Ref Range   Glucose-Capillary 72 65 - 99 mg/dL   Comment 1 Document in Chart     ABGS No results for input(s): PHART, PO2ART, TCO2, HCO3 in the last 72 hours.  Invalid input(s): PCO2 CULTURES Recent Results (from the past 240 hour(s))  MRSA PCR Screening     Status: None   Collection Time: 06/11/16  8:00 AM  Result Value Ref Range Status   MRSA by PCR NEGATIVE NEGATIVE Final    Comment:        The GeneXpert MRSA Assay (FDA approved for NASAL specimens only), is one component of a comprehensive MRSA colonization surveillance program. It is not intended to diagnose MRSA infection nor to guide or monitor treatment for MRSA infections.    Studies/Results: No results found.  Medications: I have reviewed the patient's current medications.  Assesment:   Principal Problem:   Acute exacerbation of CHF (congestive heart failure) (HCC) Active Problems:   Sleep apnea   Hypothyroid   Panhypopituitarism (HCC)   Moderate aortic regurgitation   Seizure disorder (HCC)   Diabetes ([place])   Essential hypertension   Morbid obesity due to excess calories (HCC)   Elevated troponin   COPD with exacerbation (HCC)   CKD (chronic kidney disease) stage 4, GFR 15-29 ml/min (HCC)   Acute on chronic congestive heart failure (HCC) low TSH   Plan:  Medications reviewed Continue oral dsteroid Continue IV diuretics Will do physical therapy evaluation for ambulation Will monitor BMP Continue BIPAP as needed    LOS: 5 days    Tashena Ibach 06/16/2016, 8:12 AM

## 2016-06-16 NOTE — Evaluation (Signed)
Physical Therapy Evaluation Patient Details Name: [name] [name] MRN: [id number] DOB: [birth year] Today's Date: 06/16/2016   History of Present Illness  70 yo M admitted 06/11/2016 with SOB.  Dx; Acute on chronic combined CHF, acute on chronic respiratory failure.  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  Clinical Impression  Pt received sitting up in the chair and was agreeable to PT evaluation.  Pt states that he ambulates with a cane at baseline, and is independent with dressing/bathing.  His family assists him with running errands because he does not drive.  Today, pt demonstrated all aspects of functional mobility at Mod I level, and ambulated 451f with RW and Mod (I).  Pt does not demonstrate need for skilled PT at this time, therefore will sign off.      Follow Up Recommendations No PT follow up    Equipment Recommendations  None recommended by PT    Recommendations for Other Services       Precautions / Restrictions Precautions Precautions: None Restrictions Weight Bearing Restrictions: No      Mobility  Bed Mobility               General bed mobility comments: NA - pt sleeps in recliner at home.   Transfers Overall transfer level: Modified independent                  Ambulation/Gait Ambulation/Gait assistance: Modified independent (Device/Increase time) Ambulation Distance (Feet): 400 Feet Assistive device: Rolling walker (2 wheeled) Gait Pattern/deviations: Step-through pattern     General Gait Details: 1 standing rest break due to fatigue.    Stairs            Wheelchair Mobility    Modified Rankin (Stroke Patients Only)       Balance Overall balance assessment: Modified  Independent                                           Pertinent Vitals/Pain Pain Assessment: No/denies pain    Home Living   Living Arrangements: Alone   Type of Home: House Home Access: Stairs to enter   [employer] Steps: 2 in the front and 3-4 in the back.  Home Layout: One level Home Equipment: Walker - 2 wheels;Cane - single point;Bedside commode      Prior Function Level of Independence: Independent with assistive device(s)   Gait / Transfers Assistance Needed: Pt states that he uses the cane at home, Pt states that he very seldomly uses the RW.   ADL's / Homemaking Assistance Needed: independent with dressing/bathing, not driving, Dtr, friends and brother assist with running errands.   Comments: Pt states that he sleeps in a recliner at home.     Hand Dominance   Dominant Hand: Right    Extremity/Trunk Assessment   Upper Extremity Assessment: Overall WFL for tasks assessed           Lower Extremity Assessment: Overall WFL for tasks assessed         Communication   Communication: No difficulties  Cognition Arousal/Alertness: Awake/alert Behavior During Therapy: WFL for tasks assessed/performed Overall Cognitive Status: Within Functional Limits for tasks assessed  General Comments      Exercises        Assessment/Plan    PT Assessment All further PT needs can be met in the next venue of care;Patent does not need any further PT services  PT Diagnosis Generalized weakness   PT Problem List    PT Treatment Interventions     PT Goals (Current goals can be found in the Care Plan section) Acute Rehab PT Goals PT Goal Formulation: All assessment and education complete, DC therapy    Frequency     Barriers to discharge        Co-evaluation               End of Session Equipment Utilized During Treatment: Gait belt;Oxygen Activity Tolerance: Patient tolerated treatment  well;Patient limited by fatigue Patient left: in chair;with call bell/phone within reach      Functional Assessment Tool Used: [employer] "6-clicks"  Functional Limitation: Mobility: Walking and moving around Mobility: Walking and Moving Around Current Status [phone]): At least 1 percent but less than 20 percent impaired, limited or restricted Mobility: Walking and Moving Around Goal Status [phone]): At least 1 percent but less than 20 percent impaired, limited or restricted Mobility: Walking and Moving Around Discharge Status [phone]): At least 1 percent but less than 20 percent impaired, limited or restricted    Time: 1142-1202 PT Time Calculation (min) (ACUTE ONLY): 20 min   Charges:   PT Evaluation $PT Eval Low Complexity: 1 Procedure     PT G Codes:   PT G-Codes **NOT FOR INPATIENT CLASS** Functional Assessment Tool Used: [employer] "6-clicks"  Functional Limitation: Mobility: Walking and moving around Mobility: Walking and Moving Around Current Status [phone]): At least 1 percent but less than 20 percent impaired, limited or restricted Mobility: Walking and Moving Around Goal Status [phone]): At least 1 percent but less than 20 percent impaired, limited or restricted Mobility: Walking and Moving Around Discharge Status [phone]): At least 1 percent but less than 20 percent impaired, limited or restricted    Beth Calia Napp, PT, DPT X: [phone]

## 2016-06-16 NOTE — Progress Notes (Signed)
Patient ambulated twice in hallway. Patient walked with walker with no assistance approximately 100 feet.  Patient tolerated ambulation moderately well, became tired, slight sob toward end of walk.  Will continue to monitor patient.

## 2016-06-16 NOTE — Consult Note (Signed)
Primary Cardiologist: Kate Sable MD  Cardiology Specific Problem List: 1. Mixed Systolic and Diastolic CHF 2. CAD 3. Hypertension   Subjective:    Feeling some better but is having DOE despite use of O2 via [place]. Has not been weighed in a couple of days.   Objective:   Temp:  [98.7 F (37.1 C)-98.8 F (37.1 C)] 98.7 F (37.1 C) (07/10 0407) Pulse Rate:  [77-85] 83 (07/10 0407) Resp:  [20] 20 (07/10 0407) BP: (133-137)/(48-55) 134/55 mmHg (07/10 0407) SpO2:  [92 %-97 %] 93 % (07/10 0806) Last BM Date: 06/15/16 (per patient report)  Filed Weights   06/11/16 0751 06/14/16 1404  Weight: 256 lb 2.8 oz (116.2 kg) 256 lb (116.121 kg)    Intake/Output Summary (Last 24 hours) at 06/16/16 1033 Last data filed at 06/16/16 0600  Gross per 24 hour  Intake    480 ml  Output   1900 ml  Net  -1420 ml    Telemetry: NSR rates in the 70's.   Exam:  General: No acute distress.  Lungs: Clear to auscultation, nonlabored.  Cardiac: No elevated JVP or bruits. RRR, 1/6 systolic murmur soft S3. no gallop or rub.   Abdomen: Normoactive bowel sounds, nontender, mildly distended.  Extremities: 1+ pitting edema, distal pulses full.  Neuropsychiatric: Alert and oriented x3, affect appropriate.   Lab Results:  Basic Metabolic Panel:  Recent Labs Lab 06/14/16 0523 06/15/16 0544 06/16/16 0540  NA 134* 133* 133*  K 4.8 4.6 4.0  CL 100* 98* 101  CO2 24 26 24   GLUCOSE 114* 85 86  BUN 81* 79* 85*  CREATININE 2.47* 2.45* 2.49*  CALCIUM 8.5* 8.9 8.6*    CBC:  Recent Labs Lab 06/11/16 0508  WBC 13.7*  HGB 14.8  HCT 45.5  MCV 95.8  PLT 145*    Cardiac Enzymes:  Recent Labs Lab 06/11/16 0508 06/12/16 0532  TROPONINI 0.08* 0.07*   Echocardiogram 12/31/2015.  Left ventricle: The cavity size was moderately dilated. There was  moderate concentric hypertrophy. Systolic function was mildly to  moderately reduced. The estimated ejection fraction was in the   range of 40% to 45%. Hypokinesis of the anterolateral and  inferolateral myocardium. Doppler parameters are consistent with  a reversible restrictive pattern, indicative of decreased left  ventricular diastolic compliance and/or increased left atrial  pressure (grade 3 diastolic dysfunction). Doppler parameters are  consistent with high ventricular filling pressure. - Aortic valve: There was moderate regurgitation. - Mitral valve: There was moderate regurgitation. - Left atrium: The atrium was severely dilated. - Atrial septum: No defect or patent foramen ovale was identified  by color flow Doppler. - Tricuspid valve: There was moderate regurgitation. - Pulmonary arteries: Systolic pressure was increased. PA peak  pressure: 61 mm Hg (S). - Inferior vena cava: The vessel was normal in size. The  respirophasic diameter changes were in the normal range (>= 50%),  consistent with normal central venous pressure.  Medications:   Scheduled Medications: . albuterol  2.5 mg Nebulization Q6H  . aspirin EC  81 mg Oral Daily  . calcitRIOL  0.5 mcg Oral BID  . cloNIDine  0.2 mg Oral TID  . desmopressin  0.2 mg Oral BID  . doxycycline  100 mg Oral Q12H  . furosemide  40 mg Intravenous Q12H  . heparin  5,000 Units Subcutaneous Q8H  . insulin aspart  0-20 Units Subcutaneous Q4H  . isosorbide-hydrALAZINE  1 tablet Oral TID  . levothyroxine  175 mcg Oral  QAC breakfast  . loratadine  10 mg Oral Daily  . mometasone-formoterol  2 puff Inhalation BID  . pantoprazole  40 mg Oral BID  . phenytoin  200 mg Oral Once per day on Mon Thu   And  . phenytoin  300 mg Oral Once per day on Sun Tue Wed Fri Sat  . polyethylene glycol  17 g Oral Daily  . predniSONE  40 mg Oral Q breakfast  . simvastatin  20 mg Oral Daily  . sodium chloride flush  3 mL Intravenous Q12H  . Vitamin D (Ergocalciferol)  50,000 Units Oral Q7 days    PRN Medications: acetaminophen, albuterol, alum & mag hydroxide-simeth,  sodium chloride   Assessment and Plan:   1. Acute on Chronic combined CHF:  He has diuresed 8.8 liters since admission. Has not been weighed in a couple of days. Feels some better but continues to have DOE, despite use of O2. PT is going to work with him. Suspect this is multifactorial with hx of COPD and systolic and diastolic dysfunction with pulmonary hypertension.   Continues on IV diuretics lasix 40 mg BID. Will await his weight to see how close to dry wt of 244 lbs. May be able to transition to po diuretics. [name] on torsemide 40 mg BID at home. Creatinine is 2.49. CO2 24. Still has mild fluid overload on exam.   2. CAD: Known history of 20% LM disease per cardiac cath in 09/2015, with otherwise no CAD. Continue ASA, and statin. Minimal troponin elevation due to demand ischemia. On statin therapy and   3. Hypertension: Currently on clonidine 0.2 mg TID and Bi-Dil. Well controlled.   Phill Myron. Lawrence NP [place]  06/16/2016, 10:33 AM   Attending note:  Patient seen and examined. Modified above note by Ms. Lawrence NP. He continues to feel better with less shortness of breath and good diuresis on IV Lasix, still oxygen requirement and working with PT. Renal dysfunction is stable with creatinine of 2.4-2.5. On examination he appears comfortable, lungs with diminished breath sounds but no wheezing. Lab work shows creatinine 2.5, potassium 4.0. Continue IV diuresis and follow-up weight. Aim is to transition back to oral Demadex.  Satira Sark, M.D., F.A.C.C.

## 2016-06-16 NOTE — Care Management Important Message (Signed)
Important Message  Patient Details  Name: LENDALL CLEMMER MRN: [id number] Date of Birth: [birth year]   Medicare Important Message Given:  Yes    Joeanna Howdyshell, Chauncey Reading, RN 06/16/2016, 8:24 AM

## 2016-06-17 LAB — GLUCOSE, CAPILLARY
GLUCOSE-CAPILLARY: 118 mg/dL — AB (ref 65–99)
GLUCOSE-CAPILLARY: 123 mg/dL — AB (ref 65–99)
GLUCOSE-CAPILLARY: 125 mg/dL — AB (ref 65–99)
Glucose-Capillary: 123 mg/dL — ABNORMAL HIGH (ref 65–99)
Glucose-Capillary: 87 mg/dL (ref 65–99)

## 2016-06-17 LAB — BASIC METABOLIC PANEL
Anion gap: 9 (ref 5–15)
BUN: 89 mg/dL — AB (ref 6–20)
CHLORIDE: 101 mmol/L (ref 101–111)
CO2: 23 mmol/L (ref 22–32)
Calcium: 8.7 mg/dL — ABNORMAL LOW (ref 8.9–10.3)
Creatinine, Ser: 2.55 mg/dL — ABNORMAL HIGH (ref 0.61–1.24)
GFR calc Af Amer: 28 mL/min — ABNORMAL LOW (ref >60)
GFR calc non Af Amer: 24 mL/min — ABNORMAL LOW (ref >60)
Glucose, Bld: 93 mg/dL (ref 65–99)
POTASSIUM: 4.3 mmol/L (ref 3.5–5.1)
SODIUM: 133 mmol/L — AB (ref 135–145)

## 2016-06-17 MED ORDER — ISOSORB DINITRATE-HYDRALAZINE 20-37.5 MG PO TABS: 1.0000 | ORAL_TABLET | Freq: Three times a day (TID) | ORAL | Status: AC

## 2016-06-17 MED ORDER — TORSEMIDE 20 MG PO TABS: 40.0000 mg | ORAL_TABLET | Freq: Two times a day (BID) | ORAL | Status: DC

## 2016-06-17 MED ORDER — PREDNISONE 10 MG (21) PO TBPK: 10.0000 mg | ORAL_TABLET | Freq: Every day | ORAL | Status: DC

## 2016-06-17 MED ORDER — ALBUTEROL SULFATE (2.5 MG/3ML) 0.083% IN NEBU
2.5000 mg | INHALATION_SOLUTION | Freq: Three times a day (TID) | RESPIRATORY_TRACT | Status: DC
  Administered 2016-06-17: 2.5 mg via RESPIRATORY_TRACT
  Filled 2016-06-17: qty 3

## 2016-06-17 NOTE — Progress Notes (Signed)
Pt iv removed. Iv site wnl. AVS reviewed with pt and verbalized understanding. Pt states has all his belongings. Transported by NT via WC to main entrance. Pt stable at time of d/c

## 2016-06-17 NOTE — Progress Notes (Signed)
Dr Legrand Rams called to change dc med order to d/c norvasc and continue bidil. Dr Legrand Rams states he will fix it.

## 2016-06-17 NOTE — Progress Notes (Signed)
Pt resting in chair. Nad. Introduced self to pt. Walker and urinal within reach

## 2016-06-17 NOTE — Care Management Note (Signed)
Case Management Note  Patient Details  Name: [name] [name] MRN: [id number] Date of Birth: 06-Mar-1946   Expected Discharge Date:  06/14/16               Expected Discharge Plan:  Home/Self Care  In-House Referral:  NA  Discharge planning Services  CM Consult  Post Acute Care Choice:    Choice offered to:     DME Arranged:    DME Agency:     HH Arranged:    [place] Agency:     Status of Service:  Completed, signed off  If discussed at [employer] of Stay Meetings, dates discussed:    Additional Comments: Patient d/c today with self care, refuses home health RN. PT did not recommend any HH PT.  Will sign off.  Nichola Cieslinski, Chauncey Reading, RN 06/17/2016, 8:59 AM

## 2016-06-17 NOTE — Discharge Summary (Addendum)
Physician Discharge Summary  Patient ID: [name] [name] MRN: [id number] DOB/AGE: January 25, 1946 70 y.o. Primary Care Physician:Maysin Carstens, MD Admit date: 06/11/2016 Discharge date: 06/17/2016    Discharge Diagnoses:   Principal Problem:   Acute exacerbation of CHF (congestive heart failure) ([place]) Active Problems:   Sleep apnea   Hypothyroid   Panhypopituitarism (HCC)   Moderate aortic regurgitation   Seizure disorder (HCC)   Diabetes ([place])   Essential hypertension   Morbid obesity due to excess calories (HCC)   Elevated troponin   COPD with exacerbation (H[facility])   CKD (chronic kidney disease) stage 4, GFR 15-29 ml/min (HCC)   Acute on chronic congestive heart failure (HCC)     Medication List    STOP taking these medications        amLODipine 5 MG tablet  Commonly known as:  NORVASC     budesonide-formoterol 160-4.5 MCG/ACT inhaler  Commonly known as:  SYMBICORT     predniSONE 20 MG tablet  Commonly known as:  DELTASONE  Replaced by:  predniSONE 10 MG (21) Tbpk tablet     Vitamin D (Ergocalciferol) 50000 units Caps capsule  Commonly known as:  DRISDOL      TAKE these medications        ADVAIR DISKUS 250-50 MCG/DOSE Aepb  Generic drug:  Fluticasone-Salmeterol  Inhale 1 puff into the lungs 2 (two) times daily.     albuterol 108 (90 Base) MCG/ACT inhaler  Commonly known as:  PROVENTIL HFA;VENTOLIN HFA  Inhale 2 puffs into the lungs every 4 (four) hours as needed for wheezing or shortness of breath.     albuterol (2.5 MG/3ML) 0.083% nebulizer solution  Commonly known as:  PROVENTIL  Inhale 3 mLs into the lungs every 6 (six) hours as needed. Shortness of breath/wheezing     calcitRIOL 0.5 MCG capsule  Commonly known as:  ROCALTROL  Take 0.5 mcg by mouth 2 (two) times daily.     cloNIDine 0.2 MG tablet  Commonly known as:  CATAPRES  TAKE (1) TABLET BY MOUTH (3) TIMES DAILY     desmopressin 0.2 MG tablet  Commonly known as:  DDAVP  Take 1 tablet (0.2 mg total)  by mouth 2 (two) times daily.     fluticasone 50 MCG/ACT nasal spray  Commonly known as:  FLONASE  Place 2 sprays into both nostrils daily.     isosorbide-hydrALAZINE 20-37.5 MG tablet  Commonly known as:  BIDIL  Take 1 tablet by mouth 3 (three) times daily.     levothyroxine 175 MCG tablet  Commonly known as:  SYNTHROID, LEVOTHROID  TAKE ONE TABLET BY MOUTH ONCE DAILY.     pantoprazole 40 MG tablet  Commonly known as:  PROTONIX  Take 1 tablet (40 mg total) by mouth 2 (two) times daily before a meal.     phenytoin 100 MG ER capsule  Commonly known as:  DILANTIN  Take 200-300 mg by mouth daily. Takes 200 mg on Monday and Thursdsay. On all other days of the week takes 300 mg.     polyethylene glycol powder powder  Commonly known as:  GLYCOLAX/MIRALAX  Take 17 g by mouth daily. May decrease to 17 g three times a week if needed.     potassium chloride SA 20 MEQ tablet  Commonly known as:  K-DUR,KLOR-CON  Take 20 mEq by mouth 2 (two) times daily.     predniSONE 10 MG (21) Tbpk tablet  Commonly known as:  STERAPRED UNI-PAK 21 TAB  Take 1 tablet (  10 mg total) by mouth daily. 40 mg po daily for 3 das, 30 mg po daily for 3 daily 20 mg po daily for 3 days then 10 mg po daily for 3 days then D/C.     simvastatin 20 MG tablet  Commonly known as:  ZOCOR  Take 20 mg by mouth daily.     tiotropium 18 MCG inhalation capsule  Commonly known as:  SPIRIVA  Place 18 mcg into inhaler and inhale daily.     torsemide 20 MG tablet  Commonly known as:  DEMADEX  Take 2 tablets (40 mg total) by mouth 2 (two) times daily.        Discharged Condition: improved  Consults: Cardiology and pulmonary  Significant Diagnostic Studies: Dg Chest Port 1 View  06/11/2016  CLINICAL DATA:  Acute onset of shortness of breath and nasal congestion. Chest congestion and productive cough. Initial encounter. EXAM: PORTABLE CHEST 1 VIEW COMPARISON:  Chest radiograph performed 05/10/2016 FINDINGS: Vascular  congestion is noted. Increased interstitial markings may reflect mild interstitial edema. No definite pleural effusion or pneumothorax is seen The cardiomediastinal silhouette is mildly enlarged. Right paratracheal prominence is stable from prior studies and likely reflects normal vasculature. No acute osseous abnormalities are seen. IMPRESSION: Vascular congestion and mild cardiomegaly. Increased interstitial markings may reflect mild interstitial edema. Electronically Signed   By: Garald Balding M.D.   On: 06/11/2016 06:05    Lab Results: Basic Metabolic Panel:  Recent Labs  06/16/16 0540 06/17/16 0454  NA 133* 133*  K 4.0 4.3  CL 101 101  CO2 24 23  GLUCOSE 86 93  BUN 85* 89*  CREATININE 2.49* 2.55*  CALCIUM 8.6* 8.7*   Liver Function Tests: No results for input(s): AST, ALT, ALKPHOS, BILITOT, PROT, ALBUMIN in the last 72 hours.   CBC: No results for input(s): WBC, NEUTROABS, HGB, HCT, MCV, PLT in the last 72 hours.  Recent Results (from the past 240 hour(s))  MRSA PCR Screening     Status: None   Collection Time: 06/11/16  8:00 AM  Result Value Ref Range Status   MRSA by PCR NEGATIVE NEGATIVE Final    Comment:        The GeneXpert MRSA Assay (FDA approved for NASAL specimens only), is one component of a comprehensive MRSA colonization surveillance program. It is not intended to diagnose MRSA infection nor to guide or monitor treatment for MRSA infections.      Hospital Course:   This is a 70 years old male with history of multiple medical illnesses was admitted due to worsening shortness of breath. Patient is a known case COPD on oxygen and combined CHF. On admission his chest xray showed pulmonary congestion and cardiomegaly.  Cardiology and pulmonary consults were done who assisted in his management. Patient was diuresed with IV lasix. He was also started on IV steroid and nebulizer treatment. Over the hospital stay patient improved and breathing improved. [name]  being discharged in stable condition on oral diuretics and oral steroid. He will be followed in out patient in 1 week  Discharge Exam: Blood pressure 121/47, pulse 75, temperature 98.5 F (36.9 C), temperature source Oral, resp. rate 22, height 6\' 1"  (1.854 m), weight 119.931 kg (264 lb 6.4 oz), SpO2 98 %.  General appearance: alert, no distress and moderately obese Resp: good air entry, few rhonchi Cardio: S1, S2 normal GI: soft, non-tender; bowel sounds normal; no masses, no organomegaly Extremities: edema 1+ leg edema  Disposition:  home  Follow-up Information    Follow up with [facility], MD In 1 week.   Specialty:  Internal Medicine   Contact information:   [place] [address] [phone]       Signed: [name]   06/17/2016, 8:27 AM

## 2016-07-04 ENCOUNTER — Ambulatory Visit (INDEPENDENT_AMBULATORY_CARE_PROVIDER_SITE_OTHER): Payer: Medicare HMO | Admitting: Adult Health

## 2016-07-04 ENCOUNTER — Encounter: Payer: Self-pay | Admitting: Adult Health

## 2016-07-04 VITALS — BP 136/46 | HR 84 | Ht 73.0 in | Wt 245.0 lb

## 2016-07-04 DIAGNOSIS — I5032 Chronic diastolic (congestive) heart failure: Secondary | ICD-10-CM | POA: Diagnosis not present

## 2016-07-04 DIAGNOSIS — N182 Chronic kidney disease, stage 2 (mild): Secondary | ICD-10-CM

## 2016-07-04 DIAGNOSIS — J42 Unspecified chronic bronchitis: Secondary | ICD-10-CM | POA: Diagnosis not present

## 2016-07-04 DIAGNOSIS — I10 Essential (primary) hypertension: Secondary | ICD-10-CM | POA: Diagnosis not present

## 2016-07-04 NOTE — Progress Notes (Signed)
Name: [name] [name]    DOB: 19-Dec-1945  Age: 70 y.o.  MR#: [id number]       PCP:  [name], MD      Insurance: Payor: [employer] MEDICARE / Plan: [employer] MEDICARE HMO/PPO / Product Type: *No Product type* /   CC:   No chief complaint on file.   VS Vitals:   07/04/16 1432  BP: (!) 136/46  Pulse: 84  SpO2: 97%  Weight: 245 lb (111.1 kg)  Height: 6\' 1"  (1.854 m)    Weights Current Weight  07/04/16 245 lb (111.1 kg)  06/17/16 264 lb 6.4 oz (119.9 kg)  05/10/16 258 lb (117 kg)    Blood Pressure  BP Readings from Last 3 Encounters:  07/04/16 (!) 136/46  06/17/16 (!) 121/47  05/10/16 156/57     Admit date:  (Not on file) Last encounter with RMR:  03/21/2016   Allergy Review of patient's allergies indicates no known allergies.  Current Outpatient Prescriptions  Medication Sig Dispense Refill  . ADVAIR DISKUS 250-50 MCG/DOSE AEPB Inhale 1 puff into the lungs 2 (two) times daily.    Marland Kitchen albuterol (PROVENTIL HFA;VENTOLIN HFA) 108 (90 Base) MCG/ACT inhaler Inhale 2 puffs into the lungs every 4 (four) hours as needed for wheezing or shortness of breath. 1 Inhaler 3  . albuterol (PROVENTIL) (2.5 MG/3ML) 0.083% nebulizer solution Inhale 3 mLs into the lungs every 6 (six) hours as needed. Shortness of breath/wheezing    . calcitRIOL (ROCALTROL) 0.5 MCG capsule Take 0.5 mcg by mouth 2 (two) times daily.    . cloNIDine (CATAPRES) 0.2 MG tablet TAKE (1) TABLET BY MOUTH (3) TIMES DAILY 90 tablet 6  . desmopressin (DDAVP) 0.2 MG tablet Take 1 tablet (0.2 mg total) by mouth 2 (two) times daily. 60 tablet 6  . fluticasone (FLONASE) 50 MCG/ACT nasal spray Place 2 sprays into both nostrils daily.     . isosorbide-hydrALAZINE (BIDIL) 20-37.5 MG tablet Take 1 tablet by mouth 3 (three) times daily. 90 tablet 3  . levothyroxine (SYNTHROID, LEVOTHROID) 175 MCG tablet TAKE ONE TABLET BY MOUTH ONCE DAILY. 30 tablet 0  . pantoprazole (PROTONIX) 40 MG tablet Take 1 tablet (40 mg total) by mouth 2 (two) times  daily before a meal. (Patient taking differently: Take 40 mg by mouth 2 (two) times daily. ) 60 tablet 3  . phenytoin (DILANTIN) 100 MG ER capsule Take 200-300 mg by mouth daily. Takes 200 mg on Monday and Thursdsay. On all other days of the week takes 300 mg.    . polyethylene glycol powder (GLYCOLAX/MIRALAX) powder Take 17 g by mouth daily. May decrease to 17 g three times a week if needed. (Patient taking differently: Take 17 g by mouth daily. May decrease to 17 g three times a week if needed.) 255 g 3  . potassium chloride SA (K-DUR,KLOR-CON) 20 MEQ tablet Take 20 mEq by mouth 2 (two) times daily.    . predniSONE (STERAPRED UNI-PAK 21 TAB) 10 MG (21) TBPK tablet Take 1 tablet (10 mg total) by mouth daily. 40 mg po daily for 3 das, 30 mg po daily for 3 daily 20 mg po daily for 3 days then 10 mg po daily for 3 days then D/C. 30 tablet 0  . simvastatin (ZOCOR) 20 MG tablet Take 20 mg by mouth daily.     Marland Kitchen tiotropium (SPIRIVA) 18 MCG inhalation capsule Place 18 mcg into inhaler and inhale daily.    Marland Kitchen torsemide (DEMADEX) 20 MG tablet Take 2 tablets (  40 mg total) by mouth 2 (two) times daily. 120 tablet 6   No current facility-administered medications for this visit.     Discontinued Meds:   There are no discontinued medications.  Patient Active Problem List   Diagnosis Date Noted  . Acute exacerbation of CHF (congestive heart failure) ([place]) 06/11/2016  . Acute on chronic congestive heart failure ([place]) 06/11/2016  . COPD exacerbation ([place]) 03/26/2016  . CKD (chronic kidney disease) stage 4, GFR 15-29 ml/min (HCC) 03/26/2016  . Septic arthritis ([place]) 12/28/2015  . Elevated troponin 12/28/2015  . Acute on chronic renal failure ([place]) 12/28/2015  . COPD with exacerbation ([place]) 12/28/2015  . Congestive heart failure (CHF) ([place]) 10/26/2015  . 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 Morbid obesity due to excess calories ([place])  09/14/2015  . Vitamin D deficiency 09/14/2015  . Thrombocytopenia due to blood loss   . GI bleed 03/03/2015  . Upper GI bleed 03/02/2015  . Seizures ([place]) 02/14/2015  . 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 Essential hypertension 02/14/2015  . Hemorrhoids 01/24/2015  . Constipation 01/24/2015  . Aortic insufficiency   . Sepsis ([place]) 12/06/2014  . Pyrexia   . CKD (chronic kidney disease), stage III 12/04/2014  . Chest pain 06/12/2014  . Seizure disorder ([place]) 03/27/2013  . Diabetes ([place]) 03/27/2013  . Hypertension   . Sleep apnea   . Hypothyroid   . Tobacco abuse, in remission   . Hyperlipidemia   . Panhypopituitarism ([place])   . Obese 10/28/2012  . Moderate aortic regurgitation 10/08/2012    LABS    Component Value Date/Time   NA 133 (L) 06/17/2016 0454   NA 133 (L) 06/16/2016 0540   NA 133 (L) 06/15/2016 0544   K 4.3 06/17/2016 0454   K 4.0 06/16/2016 0540   K 4.6 06/15/2016 0544   CL 101 06/17/2016 0454   CL 101 06/16/2016 0540   CL 98 (L) 06/15/2016 0544   CO2 23 06/17/2016 0454   CO2 24 06/16/2016 0540   CO2 26 06/15/2016 0544   GLUCOSE 93 06/17/2016 0454   GLUCOSE 86 06/16/2016 0540   GLUCOSE 85 06/15/2016 0544   BUN 89 (H) 06/17/2016 0454   BUN 85 (H) 06/16/2016 0540   BUN 79 (H) 06/15/2016 0544   CREATININE 2.55 (H) 06/17/2016 0454   CREATININE 2.49 (H) 06/16/2016 0540   CREATININE 2.45 (H) 06/15/2016 0544   CREATININE 2.98 (H) 07/19/2015 1415   CREATININE 2.73 (H) 07/16/2015 0910   CREATININE 2.58 (H) 11/21/2014 0914   CALCIUM 8.7 (L) 06/17/2016 0454   CALCIUM 8.6 (L) 06/16/2016 0540   CALCIUM 8.9 06/15/2016 0544   CALCIUM 8.5 09/09/2012 0536   GFRNONAA 24 (L) 06/17/2016 0454   GFRNONAA 24 (L) 06/16/2016 0540   GFRNONAA 25 (L) 06/15/2016 0544   GFRNONAA 24 (L) 11/21/2014 0914   GFRAA 28 (L) 06/17/2016 0454   GFRAA 28 (L) 06/16/2016 0540   GFRAA 29 (L) 06/15/2016 0544   GFRAA 28 (L) 11/21/2014 0914   CMP     Component Value Date/Time   NA 133  (L) 06/17/2016 0454   K 4.3 06/17/2016 0454   CL 101 06/17/2016 0454   CO2 23 06/17/2016 0454   GLUCOSE 93 06/17/2016 0454   BUN 89 (H) 06/17/2016 0454   CREATININE 2.55 (H) 06/17/2016 0454   CREATININE 2.98 (H) 07/19/2015 1415   CALCIUM 8.7 (L) 06/17/2016 0454   CALCIUM 8.5 09/09/2012 0536   PROT 8.0  06/11/2016 0508   ALBUMIN 4.0 06/11/2016 0508   AST 21 06/11/2016 0508   ALT 26 06/11/2016 0508   ALKPHOS 46 06/11/2016 0508   BILITOT 0.7 06/11/2016 0508   GFRNONAA 24 (L) 06/17/2016 0454   GFRNONAA 24 (L) 11/21/2014 0914   GFRAA 28 (L) 06/17/2016 0454   GFRAA 28 (L) 11/21/2014 0914       Component Value Date/Time   WBC 13.7 (H) 06/11/2016 0508   WBC 8.4 05/10/2016 1304   WBC 8.1 03/27/2016 0339   HGB 14.8 06/11/2016 0508   HGB 13.2 05/10/2016 1304   HGB 13.2 03/27/2016 0339   HCT 45.5 06/11/2016 0508   HCT 41.0 05/10/2016 1304   HCT 41.1 03/27/2016 0339   MCV 95.8 06/11/2016 0508   MCV 93.0 05/10/2016 1304   MCV 92.2 03/27/2016 0339    Lipid Panel     Component Value Date/Time   CHOL 157 03/22/2013 0517   TRIG 170 (H) 03/22/2013 0517   HDL 41 03/22/2013 0517   CHOLHDL 3.8 03/22/2013 0517   VLDL 34 03/22/2013 0517   LDLCALC 82 03/22/2013 0517    ABG    Component Value Date/Time   PHART 7.459 (H) 02/14/2015 2040   PCO2ART 36.1 02/14/2015 2040   PO2ART 68.9 (L) 02/14/2015 2040   HCO3 25.2 (H) 02/14/2015 2040   TCO2 23.0 02/14/2015 2040   ACIDBASEDEF 6.0 (H) 12/12/2014 1748   O2SAT 94.4 02/14/2015 2040     Lab Results  Component Value Date   TSH 0.108 (L) 06/11/2016   BNP (last 3 results)  Recent Labs  03/26/16 1734 05/10/16 1304 06/11/16 0508  BNP 372.0* 112.0* 682.0*    ProBNP (last 3 results) No results for input(s): PROBNP in the last 8760 hours.  Cardiac Panel (last 3 results) No results for input(s): CKTOTAL, CKMB, TROPONINI, RELINDX in the last 72 hours.  Iron/TIBC/Ferritin/ %Sat    Component Value Date/Time   IRON 23 (L) 12/07/2014  1918   TIBC 197 (L) 12/07/2014 1918   FERRITIN 134 12/07/2014 1918   IRONPCTSAT 12 (L) 12/07/2014 1918     EKG Orders placed or performed during the hospital encounter of 06/11/16  . EKG 12-Lead  . EKG 12-Lead  . ED EKG  . ED EKG  . EKG  . EKG 12-Lead  . EKG 12-Lead     Prior Assessment and Plan Problem List as of 07/04/2016 Reviewed: 06/11/2016  7:40 AM by Orvan Falconer, MD     Cardiovascular and Mediastinum   Hypertension   Last Assessment & Plan 01/19/2014 Office Visit Edited 01/19/2014  3:51 PM by Lendon Colonel, NP    Blood pressure currently is controlled. We will continue him on clonidine 0.1 mg 3 times a day, amlodipine 5 mg daily. Followup BMET as discussed.      Moderate aortic regurgitation   Last Assessment & Plan 05/13/2013 Office Visit Edited 05/15/2013 12:28 PM by Yehuda Savannah, MD    I discussed possible percutaneous options with Dr. Burt Knack, and, he reports that there are none at present.  There are case reports of experimental designs that permit percutaneous aVR for aortic insufficiency, but these are not currently available. Surgical aortic valve replacement may be an option, but certainly is not an attractive one.  As long as the patient maintains his current level of functioning without recurrent pulmonary edema or other serious cardiac issues, medical therapy will be continued.      Aortic insufficiency   Hemorrhoids   Last Assessment &  Plan 01/24/2015 Office Visit Edited 01/24/2015  4:03 PM by Ranae Pila, NP    Has a history of hemorrhoids which are symptomatic with rectal pain/irritation. Also with low volume hematochezia with bowel movements with chronic constipation. No other warning signs/symptoms like weight loss, VS instability, gross change in labs, etc. Hgb low but stable at 10.4. Episode of rectal bleeding 08/08/14 with CT with no acute process identified. Is on narcotic pain medicine and colace, but no other stool softener tried. Last colonosocpy 2008  negative screening study, recommended repeat in 10 years. Likely benign anorectal source but cannot rule out other more occult GI process. Will proceed with colonoscopy to rule out more occult process. Will also add Anusol rectal suppositroy for symptomsatic relief bid for up to 7 days at a time.  Proceed with TCS with Dr. Gala Romney in near future: the risks, benefits, and alternatives have been discussed with the patient in detail. The patient states understanding and desires to proceed.  Requested cardiology clearance due to severe aortic regurgitation with consideration of valve replacement. Will await cardiology input before scheduling.      Essential hypertension   Congestive heart failure (CHF) (HCC)   Acute exacerbation of CHF (congestive heart failure) (HCC)   Acute on chronic congestive heart failure (HCC)     Respiratory   COPD with exacerbation ([place])   COPD exacerbation [facility])     Digestive   Constipation   Last Assessment & Plan 01/24/2015 Office Visit Written 01/24/2015  4:01 PM by Ranae Pila, NP    On narcotic pain meds with colace. Continued persistent hard stools with rectal bleeding. Will add Miralax 17 g daily for improved regularity and softer stools. Can back off to three times a week if patient begins to have loose stools.       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  Following diuresis, he is no longer classified as morbidly obese.      Sleep apnea   Last Assessment & Plan 11/18/2012 Office Visit Written 11/18/2012  3:28 PM by Yehuda Savannah, MD  Sleep apnea is likely persistent and contributing to patient's multiple medical problems.  A return visit to a sleep specialist will be desirable but will be deferred for the present.      Tobacco abuse, in remission   Hyperlipidemia   Last Assessment & Plan 12/21/2012 Office Visit Written 12/21/2012  3:09 PM by Yehuda Savannah, MD    Lipid profile will be drawn.      Chest pain   Sepsis ([place])   Pyrexia   Seizures ([place])   Morbid obesity due to excess calories (HCC)   Vitamin D deficiency   Elevated troponin       Imaging: Dg Chest Port 1 View  Result Date: 06/11/2016 CLINICAL DATA:  Acute onset of shortness of breath and nasal congestion. Chest congestion and productive cough. Initial encounter. EXAM: PORTABLE CHEST 1 VIEW COMPARISON:  Chest radiograph performed 05/10/2016 FINDINGS: Vascular congestion is noted. Increased interstitial markings may reflect mild interstitial edema. No definite pleural effusion or pneumothorax is seen The cardiomediastinal silhouette is mildly enlarged. Right paratracheal prominence is stable from prior  studies and likely reflects normal vasculature. No acute osseous abnormalities are seen. IMPRESSION: Vascular congestion and mild cardiomegaly. Increased interstitial markings may reflect mild interstitial edema. Electronically Signed   By: Garald Balding M.D.   On: 06/11/2016 06:05
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
## 2016-07-04 NOTE — Patient Instructions (Signed)
Your physician recommends that you schedule a follow-up appointment in: 1 month    TAKE YOUR TORSEMIDE 20 MG, TAKE 2 TABLETS, TWICE A DAY      Get lab work JUST before next visit:BMET     Thank you for choosing [facility] !

## 2016-07-19 DIAGNOSIS — G40909 Epilepsy, unspecified, not intractable, without status epilepticus: Secondary | ICD-10-CM | POA: Diagnosis present

## 2016-07-19 DIAGNOSIS — Z905 Acquired absence of kidney: Secondary | ICD-10-CM

## 2016-07-19 DIAGNOSIS — Z8249 Family history of ischemic heart disease and other diseases of the circulatory system: Secondary | ICD-10-CM

## 2016-07-19 DIAGNOSIS — J441 Chronic obstructive pulmonary disease with (acute) exacerbation: Principal | ICD-10-CM | POA: Diagnosis present

## 2016-07-19 DIAGNOSIS — E871 Hypo-osmolality and hyponatremia: Secondary | ICD-10-CM | POA: Diagnosis present

## 2016-07-19 DIAGNOSIS — R1013 Epigastric pain: Secondary | ICD-10-CM | POA: Diagnosis not present

## 2016-07-19 DIAGNOSIS — I130 Hypertensive heart and chronic kidney disease with heart failure and stage 1 through stage 4 chronic kidney disease, or unspecified chronic kidney disease: Secondary | ICD-10-CM | POA: Diagnosis present

## 2016-07-19 DIAGNOSIS — E876 Hypokalemia: Secondary | ICD-10-CM | POA: Diagnosis present

## 2016-07-19 DIAGNOSIS — E230 Hypopituitarism: Secondary | ICD-10-CM | POA: Diagnosis present

## 2016-07-19 DIAGNOSIS — I272 Other secondary pulmonary hypertension: Secondary | ICD-10-CM | POA: Diagnosis present

## 2016-07-19 DIAGNOSIS — N179 Acute kidney failure, unspecified: Secondary | ICD-10-CM | POA: Diagnosis present

## 2016-07-19 DIAGNOSIS — D649 Anemia, unspecified: Secondary | ICD-10-CM | POA: Diagnosis present

## 2016-07-19 DIAGNOSIS — Z87891 Personal history of nicotine dependence: Secondary | ICD-10-CM

## 2016-07-19 DIAGNOSIS — G4730 Sleep apnea, unspecified: Secondary | ICD-10-CM | POA: Diagnosis present

## 2016-07-19 DIAGNOSIS — E1122 Type 2 diabetes mellitus with diabetic chronic kidney disease: Secondary | ICD-10-CM | POA: Diagnosis present

## 2016-07-19 DIAGNOSIS — Z9981 Dependence on supplemental oxygen: Secondary | ICD-10-CM

## 2016-07-19 DIAGNOSIS — J9610 Chronic respiratory failure, unspecified whether with hypoxia or hypercapnia: Secondary | ICD-10-CM | POA: Diagnosis present

## 2016-07-19 DIAGNOSIS — N184 Chronic kidney disease, stage 4 (severe): Secondary | ICD-10-CM | POA: Diagnosis present

## 2016-07-19 DIAGNOSIS — E785 Hyperlipidemia, unspecified: Secondary | ICD-10-CM | POA: Diagnosis present

## 2016-07-19 DIAGNOSIS — I5022 Chronic systolic (congestive) heart failure: Secondary | ICD-10-CM | POA: Diagnosis present

## 2016-07-19 DIAGNOSIS — Z809 Family history of malignant neoplasm, unspecified: Secondary | ICD-10-CM

## 2016-07-19 DIAGNOSIS — Z6832 Body mass index (BMI) 32.0-32.9, adult: Secondary | ICD-10-CM

## 2016-07-19 DIAGNOSIS — Z85528 Personal history of other malignant neoplasm of kidney: Secondary | ICD-10-CM

## 2016-07-19 DIAGNOSIS — E039 Hypothyroidism, unspecified: Secondary | ICD-10-CM | POA: Diagnosis present

## 2016-07-19 DIAGNOSIS — Z7951 Long term (current) use of inhaled steroids: Secondary | ICD-10-CM

## 2016-07-20 ENCOUNTER — Emergency Department (HOSPITAL_COMMUNITY): Payer: Medicare HMO

## 2016-07-20 ENCOUNTER — Inpatient Hospital Stay (HOSPITAL_COMMUNITY)
Admission: EM | Admit: 2016-07-20 | Discharge: 2016-07-23 | DRG: 191 | Disposition: A | Discharge status: Home / Self Care | Payer: Medicare HMO | Attending: Internal Medicine | Admitting: Internal Medicine

## 2016-07-20 ENCOUNTER — Encounter (HOSPITAL_COMMUNITY): Payer: Self-pay | Admitting: *Deleted

## 2016-07-20 DIAGNOSIS — J441 Chronic obstructive pulmonary disease with (acute) exacerbation: Principal | ICD-10-CM

## 2016-07-20 DIAGNOSIS — J9610 Chronic respiratory failure, unspecified whether with hypoxia or hypercapnia: Secondary | ICD-10-CM | POA: Diagnosis present

## 2016-07-20 DIAGNOSIS — N179 Acute kidney failure, unspecified: Secondary | ICD-10-CM | POA: Diagnosis present

## 2016-07-20 DIAGNOSIS — E032 Hypothyroidism due to medicaments and other exogenous substances: Secondary | ICD-10-CM

## 2016-07-20 DIAGNOSIS — E039 Hypothyroidism, unspecified: Secondary | ICD-10-CM | POA: Diagnosis present

## 2016-07-20 DIAGNOSIS — E785 Hyperlipidemia, unspecified: Secondary | ICD-10-CM | POA: Diagnosis present

## 2016-07-20 DIAGNOSIS — N183 Chronic kidney disease, stage 3 (moderate): Secondary | ICD-10-CM | POA: Diagnosis not present

## 2016-07-20 DIAGNOSIS — R1013 Epigastric pain: Secondary | ICD-10-CM

## 2016-07-20 DIAGNOSIS — Z87891 Personal history of nicotine dependence: Secondary | ICD-10-CM | POA: Diagnosis not present

## 2016-07-20 DIAGNOSIS — I130 Hypertensive heart and chronic kidney disease with heart failure and stage 1 through stage 4 chronic kidney disease, or unspecified chronic kidney disease: Secondary | ICD-10-CM | POA: Diagnosis present

## 2016-07-20 DIAGNOSIS — E1122 Type 2 diabetes mellitus with diabetic chronic kidney disease: Secondary | ICD-10-CM | POA: Diagnosis present

## 2016-07-20 DIAGNOSIS — Z809 Family history of malignant neoplasm, unspecified: Secondary | ICD-10-CM | POA: Diagnosis not present

## 2016-07-20 DIAGNOSIS — Z85528 Personal history of other malignant neoplasm of kidney: Secondary | ICD-10-CM | POA: Diagnosis not present

## 2016-07-20 DIAGNOSIS — Z8249 Family history of ischemic heart disease and other diseases of the circulatory system: Secondary | ICD-10-CM | POA: Diagnosis not present

## 2016-07-20 DIAGNOSIS — Z905 Acquired absence of kidney: Secondary | ICD-10-CM | POA: Diagnosis not present

## 2016-07-20 DIAGNOSIS — I272 Other secondary pulmonary hypertension: Secondary | ICD-10-CM | POA: Diagnosis present

## 2016-07-20 DIAGNOSIS — E230 Hypopituitarism: Secondary | ICD-10-CM | POA: Diagnosis present

## 2016-07-20 DIAGNOSIS — D649 Anemia, unspecified: Secondary | ICD-10-CM | POA: Diagnosis present

## 2016-07-20 DIAGNOSIS — G4730 Sleep apnea, unspecified: Secondary | ICD-10-CM | POA: Diagnosis present

## 2016-07-20 DIAGNOSIS — N184 Chronic kidney disease, stage 4 (severe): Secondary | ICD-10-CM | POA: Diagnosis present

## 2016-07-20 DIAGNOSIS — E871 Hypo-osmolality and hyponatremia: Secondary | ICD-10-CM | POA: Diagnosis present

## 2016-07-20 DIAGNOSIS — I5022 Chronic systolic (congestive) heart failure: Secondary | ICD-10-CM | POA: Diagnosis present

## 2016-07-20 DIAGNOSIS — G40909 Epilepsy, unspecified, not intractable, without status epilepticus: Secondary | ICD-10-CM | POA: Diagnosis present

## 2016-07-20 DIAGNOSIS — I10 Essential (primary) hypertension: Secondary | ICD-10-CM | POA: Diagnosis present

## 2016-07-20 DIAGNOSIS — E669 Obesity, unspecified: Secondary | ICD-10-CM | POA: Diagnosis present

## 2016-07-20 DIAGNOSIS — Z9981 Dependence on supplemental oxygen: Secondary | ICD-10-CM | POA: Diagnosis not present

## 2016-07-20 DIAGNOSIS — E876 Hypokalemia: Secondary | ICD-10-CM | POA: Diagnosis present

## 2016-07-20 LAB — CBC
HCT: 37.1 % — ABNORMAL LOW (ref 39.0–52.0)
HEMOGLOBIN: 12.5 g/dL — AB (ref 13.0–17.0)
MCH: 30.3 pg (ref 26.0–34.0)
MCHC: 33.7 g/dL (ref 30.0–36.0)
MCV: 89.8 fL (ref 78.0–100.0)
Platelets: 113 10*3/uL — ABNORMAL LOW (ref 150–400)
RBC: 4.13 MIL/uL — AB (ref 4.22–5.81)
RDW: 13.9 % (ref 11.5–15.5)
WBC: 4.6 10*3/uL (ref 4.0–10.5)

## 2016-07-20 LAB — CBC WITH DIFFERENTIAL/PLATELET
BASOS ABS: 0 10*3/uL (ref 0.0–0.1)
Basophils Relative: 1 %
EOS ABS: 0.8 10*3/uL — AB (ref 0.0–0.7)
EOS PCT: 11 %
HCT: 38 % — ABNORMAL LOW (ref 39.0–52.0)
Hemoglobin: 12.9 g/dL — ABNORMAL LOW (ref 13.0–17.0)
LYMPHS ABS: 1.3 10*3/uL (ref 0.7–4.0)
Lymphocytes Relative: 18 %
MCH: 30.8 pg (ref 26.0–34.0)
MCHC: 33.9 g/dL (ref 30.0–36.0)
MCV: 90.7 fL (ref 78.0–100.0)
Monocytes Absolute: 0.8 10*3/uL (ref 0.1–1.0)
Monocytes Relative: 10 %
Neutro Abs: 4.5 10*3/uL (ref 1.7–7.7)
Neutrophils Relative %: 60 %
PLATELETS: 125 10*3/uL — AB (ref 150–400)
RBC: 4.19 MIL/uL — AB (ref 4.22–5.81)
RDW: 14.2 % (ref 11.5–15.5)
WBC: 7.5 10*3/uL (ref 4.0–10.5)

## 2016-07-20 LAB — URINALYSIS, ROUTINE W REFLEX MICROSCOPIC
Bilirubin Urine: NEGATIVE
Glucose, UA: NEGATIVE mg/dL
HGB URINE DIPSTICK: NEGATIVE
KETONES UR: NEGATIVE mg/dL
Leukocytes, UA: NEGATIVE
Nitrite: NEGATIVE
PROTEIN: NEGATIVE mg/dL
Specific Gravity, Urine: <1.005 — ABNORMAL LOW (ref 1.005–1.030)
pH: 7 (ref 5.0–8.0)

## 2016-07-20 LAB — GLUCOSE, CAPILLARY
Glucose-Capillary: 224 mg/dL — ABNORMAL HIGH (ref 65–99)
Glucose-Capillary: 150 mg/dL — ABNORMAL HIGH (ref 65–99)

## 2016-07-20 LAB — COMPREHENSIVE METABOLIC PANEL
ALK PHOS: 44 U/L (ref 38–126)
ALT: 19 U/L (ref 17–63)
AST: 24 U/L (ref 15–41)
Albumin: 3.7 g/dL (ref 3.5–5.0)
Anion gap: 8 (ref 5–15)
BUN: 82 mg/dL — ABNORMAL HIGH (ref 6–20)
CALCIUM: 8.9 mg/dL (ref 8.9–10.3)
CO2: 32 mmol/L (ref 22–32)
CREATININE: 3.38 mg/dL — AB (ref 0.61–1.24)
Chloride: 93 mmol/L — ABNORMAL LOW (ref 101–111)
GFR, EST AFRICAN AMERICAN: 20 mL/min — AB (ref >60)
GFR, EST NON AFRICAN AMERICAN: 17 mL/min — AB (ref >60)
Glucose, Bld: 146 mg/dL — ABNORMAL HIGH (ref 65–99)
Potassium: 3.1 mmol/L — ABNORMAL LOW (ref 3.5–5.1)
Sodium: 133 mmol/L — ABNORMAL LOW (ref 135–145)
Total Bilirubin: 0.5 mg/dL (ref 0.3–1.2)
Total Protein: 7.3 g/dL (ref 6.5–8.1)

## 2016-07-20 LAB — CREATININE, SERUM
CREATININE: 3.35 mg/dL — AB (ref 0.61–1.24)
GFR calc non Af Amer: 17 mL/min — ABNORMAL LOW (ref >60)
GFR, EST AFRICAN AMERICAN: 20 mL/min — AB (ref >60)

## 2016-07-20 LAB — TSH: TSH: 0.053 u[IU]/mL — AB (ref 0.350–4.500)

## 2016-07-20 LAB — LIPASE, BLOOD: LIPASE: 57 U/L — AB (ref 11–51)

## 2016-07-20 LAB — MAGNESIUM: MAGNESIUM: 1.8 mg/dL (ref 1.7–2.4)

## 2016-07-20 MED ORDER — GI COCKTAIL ~~LOC~~
30.0000 mL | Freq: Once | ORAL | Status: AC
Start: 2016-07-20 — End: 2016-07-20
  Administered 2016-07-20: 30 mL via ORAL
  Filled 2016-07-20: qty 30

## 2016-07-20 MED ORDER — ENOXAPARIN SODIUM 60 MG/0.6ML ~~LOC~~ SOLN
50.0000 mg | SUBCUTANEOUS | Status: DC
  Administered 2016-07-20 – 2016-07-22 (×3): 50 mg via SUBCUTANEOUS
  Filled 2016-07-20 (×3): qty 0.6

## 2016-07-20 MED ORDER — MOMETASONE FURO-FORMOTEROL FUM 200-5 MCG/ACT IN AERO
2.0000 | INHALATION_SPRAY | Freq: Two times a day (BID) | RESPIRATORY_TRACT | Status: DC
  Administered 2016-07-20 – 2016-07-23 (×6): 2 via RESPIRATORY_TRACT
  Filled 2016-07-20: qty 8.8

## 2016-07-20 MED ORDER — LEVOFLOXACIN 500 MG PO TABS
500.0000 mg | ORAL_TABLET | ORAL | Status: DC
  Administered 2016-07-20 – 2016-07-22 (×2): 500 mg via ORAL
  Filled 2016-07-20 (×3): qty 1

## 2016-07-20 MED ORDER — SODIUM CHLORIDE 0.9 % IV SOLN
INTRAVENOUS | Status: DC
  Administered 2016-07-20: 75 mL/h via INTRAVENOUS
  Administered 2016-07-22: 06:00:00 via INTRAVENOUS

## 2016-07-20 MED ORDER — TIOTROPIUM BROMIDE MONOHYDRATE 18 MCG IN CAPS
ORAL_CAPSULE | RESPIRATORY_TRACT | Status: AC
  Filled 2016-07-20: qty 5

## 2016-07-20 MED ORDER — FENTANYL CITRATE (PF) 100 MCG/2ML IJ SOLN
50.0000 ug | Freq: Once | INTRAMUSCULAR | Status: AC
  Administered 2016-07-20: 50 ug via INTRAVENOUS
  Filled 2016-07-20: qty 2

## 2016-07-20 MED ORDER — MOMETASONE FURO-FORMOTEROL FUM 200-5 MCG/ACT IN AERO
INHALATION_SPRAY | RESPIRATORY_TRACT | Status: AC
  Filled 2016-07-20: qty 8.8

## 2016-07-20 MED ORDER — SIMVASTATIN 20 MG PO TABS
20.0000 mg | ORAL_TABLET | Freq: Every day | ORAL | Status: DC
  Administered 2016-07-20 – 2016-07-22 (×2): 20 mg via ORAL
  Filled 2016-07-20 (×2): qty 1

## 2016-07-20 MED ORDER — POTASSIUM CHLORIDE CRYS ER 20 MEQ PO TBCR
40.0000 meq | EXTENDED_RELEASE_TABLET | Freq: Once | ORAL | Status: AC
Start: 2016-07-20 — End: 2016-07-20
  Administered 2016-07-20: 40 meq via ORAL
  Filled 2016-07-20: qty 2

## 2016-07-20 MED ORDER — SENNOSIDES-DOCUSATE SODIUM 8.6-50 MG PO TABS: 1.0000 | ORAL_TABLET | Freq: Every evening | ORAL | Status: DC | PRN

## 2016-07-20 MED ORDER — PHENYTOIN SODIUM EXTENDED 100 MG PO CAPS
200.0000 mg | ORAL_CAPSULE | ORAL | Status: DC
  Administered 2016-07-21: 200 mg via ORAL
  Filled 2016-07-20: qty 2

## 2016-07-20 MED ORDER — INSULIN ASPART 100 UNIT/ML ~~LOC~~ SOLN: 0.0000 [IU] | Freq: Every day | SUBCUTANEOUS | Status: DC

## 2016-07-20 MED ORDER — PHENYTOIN SODIUM EXTENDED 100 MG PO CAPS
300.0000 mg | ORAL_CAPSULE | Freq: Once | ORAL | Status: AC
  Administered 2016-07-20: 300 mg via ORAL
  Filled 2016-07-20: qty 3

## 2016-07-20 MED ORDER — ALBUTEROL SULFATE (2.5 MG/3ML) 0.083% IN NEBU
2.5000 mg | INHALATION_SOLUTION | Freq: Once | RESPIRATORY_TRACT | Status: AC
  Administered 2016-07-20: 2.5 mg via RESPIRATORY_TRACT
  Filled 2016-07-20: qty 3

## 2016-07-20 MED ORDER — INSULIN ASPART 100 UNIT/ML ~~LOC~~ SOLN
4.0000 [IU] | Freq: Three times a day (TID) | SUBCUTANEOUS | Status: DC
  Administered 2016-07-20 – 2016-07-23 (×9): 4 [IU] via SUBCUTANEOUS

## 2016-07-20 MED ORDER — DESMOPRESSIN ACETATE 0.2 MG PO TABS
0.2000 mg | ORAL_TABLET | Freq: Two times a day (BID) | ORAL | Status: DC
  Administered 2016-07-21 – 2016-07-22 (×4): 0.2 mg via ORAL
  Filled 2016-07-20 (×10): qty 1

## 2016-07-20 MED ORDER — INSULIN ASPART 100 UNIT/ML ~~LOC~~ SOLN
0.0000 [IU] | Freq: Three times a day (TID) | SUBCUTANEOUS | Status: DC
  Administered 2016-07-20: 8 [IU] via SUBCUTANEOUS
  Administered 2016-07-21: 3 [IU] via SUBCUTANEOUS
  Administered 2016-07-21 – 2016-07-22 (×3): 2 [IU] via SUBCUTANEOUS
  Administered 2016-07-23: 3 [IU] via SUBCUTANEOUS

## 2016-07-20 MED ORDER — CLONIDINE HCL 0.2 MG PO TABS
0.2000 mg | ORAL_TABLET | Freq: Three times a day (TID) | ORAL | Status: DC
  Administered 2016-07-20 – 2016-07-23 (×8): 0.2 mg via ORAL
  Filled 2016-07-20 (×8): qty 1

## 2016-07-20 MED ORDER — TIOTROPIUM BROMIDE MONOHYDRATE 18 MCG IN CAPS
18.0000 ug | ORAL_CAPSULE | Freq: Every day | RESPIRATORY_TRACT | Status: DC
  Administered 2016-07-20 – 2016-07-23 (×4): 18 ug via RESPIRATORY_TRACT
  Filled 2016-07-20: qty 5

## 2016-07-20 MED ORDER — ACETAMINOPHEN 325 MG PO TABS: 650.0000 mg | ORAL_TABLET | Freq: Four times a day (QID) | ORAL | Status: DC | PRN

## 2016-07-20 MED ORDER — FAMOTIDINE IN NACL 20-0.9 MG/50ML-% IV SOLN
20.0000 mg | Freq: Once | INTRAVENOUS | Status: AC
  Administered 2016-07-20: 20 mg via INTRAVENOUS
  Filled 2016-07-20: qty 50

## 2016-07-20 MED ORDER — IPRATROPIUM-ALBUTEROL 0.5-2.5 (3) MG/3ML IN SOLN
3.0000 mL | Freq: Once | RESPIRATORY_TRACT | Status: AC
  Administered 2016-07-20: 3 mL via RESPIRATORY_TRACT
  Filled 2016-07-20: qty 3

## 2016-07-20 MED ORDER — PANTOPRAZOLE SODIUM 40 MG PO TBEC
40.0000 mg | DELAYED_RELEASE_TABLET | Freq: Two times a day (BID) | ORAL | Status: DC
  Administered 2016-07-20 – 2016-07-23 (×7): 40 mg via ORAL
  Filled 2016-07-20 (×7): qty 1

## 2016-07-20 MED ORDER — CALCITRIOL 0.25 MCG PO CAPS
0.5000 ug | ORAL_CAPSULE | Freq: Two times a day (BID) | ORAL | Status: DC
  Administered 2016-07-20 – 2016-07-23 (×6): 0.5 ug via ORAL
  Filled 2016-07-20 (×6): qty 2

## 2016-07-20 MED ORDER — ALBUTEROL SULFATE (2.5 MG/3ML) 0.083% IN NEBU: 2.5000 mg | INHALATION_SOLUTION | RESPIRATORY_TRACT | Status: DC | PRN

## 2016-07-20 MED ORDER — PHENYTOIN SODIUM EXTENDED 100 MG PO CAPS
300.0000 mg | ORAL_CAPSULE | ORAL | Status: DC
  Administered 2016-07-22 – 2016-07-23 (×2): 300 mg via ORAL
  Filled 2016-07-20 (×2): qty 3

## 2016-07-20 MED ORDER — ACETAMINOPHEN 650 MG RE SUPP: 650.0000 mg | Freq: Four times a day (QID) | RECTAL | Status: DC | PRN

## 2016-07-20 MED ORDER — METHYLPREDNISOLONE SODIUM SUCC 125 MG IJ SOLR
125.0000 mg | Freq: Once | INTRAMUSCULAR | Status: AC
  Administered 2016-07-20: 125 mg via INTRAVENOUS
  Filled 2016-07-20: qty 2

## 2016-07-20 MED ORDER — LEVOTHYROXINE SODIUM 75 MCG PO TABS
175.0000 ug | ORAL_TABLET | Freq: Every day | ORAL | Status: DC
  Administered 2016-07-21 – 2016-07-23 (×3): 175 ug via ORAL
  Filled 2016-07-20 (×3): qty 1

## 2016-07-20 MED ORDER — METHYLPREDNISOLONE SODIUM SUCC 125 MG IJ SOLR
60.0000 mg | Freq: Four times a day (QID) | INTRAMUSCULAR | Status: DC
  Administered 2016-07-20 – 2016-07-22 (×6): 60 mg via INTRAVENOUS
  Filled 2016-07-20 (×6): qty 2

## 2016-07-20 MED ORDER — IPRATROPIUM BROMIDE 0.02 % IN SOLN
0.5000 mg | Freq: Once | RESPIRATORY_TRACT | Status: AC
  Administered 2016-07-20: 0.5 mg via RESPIRATORY_TRACT
  Filled 2016-07-20: qty 2.5

## 2016-07-20 MED ORDER — ALBUTEROL (5 MG/ML) CONTINUOUS INHALATION SOLN
10.0000 mg/h | INHALATION_SOLUTION | Freq: Once | RESPIRATORY_TRACT | Status: AC
  Administered 2016-07-20: 10 mg/h via RESPIRATORY_TRACT
  Filled 2016-07-20: qty 20

## 2016-07-20 MED ORDER — ONDANSETRON HCL 4 MG PO TABS: 4.0000 mg | ORAL_TABLET | Freq: Four times a day (QID) | ORAL | Status: DC | PRN

## 2016-07-20 MED ORDER — DIATRIZOATE MEGLUMINE & SODIUM 66-10 % PO SOLN
ORAL | Status: AC
  Administered 2016-07-20: 09:00:00
  Filled 2016-07-20: qty 30

## 2016-07-20 MED ORDER — ONDANSETRON HCL 4 MG/2ML IJ SOLN: 4.0000 mg | Freq: Four times a day (QID) | INTRAMUSCULAR | Status: DC | PRN

## 2016-07-20 MED ORDER — AMLODIPINE BESYLATE 5 MG PO TABS
5.0000 mg | ORAL_TABLET | Freq: Every day | ORAL | Status: DC
  Administered 2016-07-20 – 2016-07-23 (×4): 5 mg via ORAL
  Filled 2016-07-20 (×4): qty 1

## 2016-07-20 MED ORDER — GI COCKTAIL ~~LOC~~: 30.0000 mL | Freq: Three times a day (TID) | ORAL | Status: DC | PRN

## 2016-07-20 MED ORDER — ONDANSETRON HCL 4 MG/2ML IJ SOLN
4.0000 mg | Freq: Once | INTRAMUSCULAR | Status: AC
  Administered 2016-07-20: 4 mg via INTRAVENOUS
  Filled 2016-07-20: qty 2

## 2016-07-20 MED ORDER — SODIUM CHLORIDE 0.9 % IV BOLUS (SEPSIS)
1000.0000 mL | Freq: Once | INTRAVENOUS | Status: AC
  Administered 2016-07-20: 1000 mL via INTRAVENOUS

## 2016-07-20 NOTE — ED Notes (Signed)
Called floor again, stated nurse was still busy. Asked to call back again

## 2016-07-20 NOTE — ED Triage Notes (Signed)
Pt c/o upper abd pain with n/v/d that started yesterday,

## 2016-07-20 NOTE — ED Notes (Signed)
Attempted to call report to floor. Was told RN was busy and to call back.
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
## 2016-07-20 NOTE — ED Notes (Signed)
After ambulating to bathroom and back to room pt states he feels like he needs another breathing tx. MD notified. Pt in NAD, o2 sats noted to be above 90%

## 2016-07-20 NOTE — Care Management Obs Status (Signed)
[place] NOTIFICATION   Patient Details  Name: [name] [name] MRN: [id number] Date of Birth: 1946-09-14   Medicare Observation Status Notification Given:  Yes    Briant Sites, RN 07/20/2016, 3:21 PM

## 2016-07-20 NOTE — ED Notes (Signed)
Pt ambulated to bathroom with assistance.

## 2016-07-21 LAB — BASIC METABOLIC PANEL
ANION GAP: 13 (ref 5–15)
BUN: 72 mg/dL — ABNORMAL HIGH (ref 6–20)
CO2: 27 mmol/L (ref 22–32)
Calcium: 8.7 mg/dL — ABNORMAL LOW (ref 8.9–10.3)
Chloride: 98 mmol/L — ABNORMAL LOW (ref 101–111)
Creatinine, Ser: 3.13 mg/dL — ABNORMAL HIGH (ref 0.61–1.24)
GFR calc Af Amer: 21 mL/min — ABNORMAL LOW (ref >60)
GFR calc non Af Amer: 19 mL/min — ABNORMAL LOW (ref >60)
GLUCOSE: 135 mg/dL — AB (ref 65–99)
POTASSIUM: 4.2 mmol/L (ref 3.5–5.1)
Sodium: 138 mmol/L (ref 135–145)

## 2016-07-21 LAB — HEMOGLOBIN A1C
Hgb A1c MFr Bld: 6 % — ABNORMAL HIGH (ref 4.8–5.6)
Mean Plasma Glucose: 126 mg/dL

## 2016-07-21 LAB — GLUCOSE, CAPILLARY
GLUCOSE-CAPILLARY: 135 mg/dL — AB (ref 65–99)
Glucose-Capillary: 143 mg/dL — ABNORMAL HIGH (ref 65–99)
Glucose-Capillary: 163 mg/dL — ABNORMAL HIGH (ref 65–99)
Glucose-Capillary: 136 mg/dL — ABNORMAL HIGH (ref 65–99)

## 2016-07-21 LAB — CBC
HEMATOCRIT: 35 % — AB (ref 39.0–52.0)
HEMOGLOBIN: 12 g/dL — AB (ref 13.0–17.0)
MCH: 30.9 pg (ref 26.0–34.0)
MCHC: 34.3 g/dL (ref 30.0–36.0)
MCV: 90.2 fL (ref 78.0–100.0)
Platelets: 124 10*3/uL — ABNORMAL LOW (ref 150–400)
RBC: 3.88 MIL/uL — ABNORMAL LOW (ref 4.22–5.81)
RDW: 14 % (ref 11.5–15.5)
WBC: 8.2 10*3/uL (ref 4.0–10.5)

## 2016-07-21 MED ORDER — LORATADINE 10 MG PO TABS
10.0000 mg | ORAL_TABLET | Freq: Every day | ORAL | Status: DC
  Administered 2016-07-21 – 2016-07-23 (×3): 10 mg via ORAL
  Filled 2016-07-21 (×3): qty 1
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
## 2016-07-21 NOTE — Progress Notes (Signed)
Subjective: Patient came to [facility] due to epigastric pain. His abdominal pain has resolved but patient is found to have worsening renal failure and exacerbation of CPD. [name] being rehydrated and also receiving iv steroid.  Objective: Vital signs in last 24 hours: Temp:  [98.6 F (37 C)-99.3 F (37.4 C)] 98.6 F (37 C) (08/14 2800) Pulse Rate:  [85-105] 85 (08/14 0613) Resp:  [17-22] 20 (08/14 0613) BP: (126-153)/(42-55) 126/42 (08/14 0613) SpO2:  [94 %-100 %] 96 % (08/14 0728) Weight:  [111.1 kg (245 lb)] 111.1 kg (245 lb) (08/13 1018) Weight change: -0 kg (-0 oz)    Intake/Output from previous day: 08/13 0701 - 08/14 0700 In: 743.8 [P.O.:240; I.V.:503.8] Out: 501 [Urine:500; Stool:1]  PHYSICAL EXAM General appearance: alert and no distress Resp: diminished breath sounds bilaterally and wheezes bilaterally Cardio: S1, S2 normal GI: soft, non-tender; bowel sounds normal; no masses,  no organomegaly Extremities: extremities normal, atraumatic, no cyanosis or edema  Lab Results:  Results for orders placed or performed during the hospital encounter of 07/20/16 (from the past 48 hour(s))  Comprehensive metabolic panel     Status: Abnormal   Collection Time: 07/20/16 12:35 AM  Result Value Ref Range   Sodium 133 (L) 135 - 145 mmol/L   Potassium 3.1 (L) 3.5 - 5.1 mmol/L   Chloride 93 (L) 101 - 111 mmol/L   CO2 32 22 - 32 mmol/L   Glucose, Bld 146 (H) 65 - 99 mg/dL   BUN 82 (H) 6 - 20 mg/dL   Creatinine, Ser 3.38 (H) 0.61 - 1.24 mg/dL   Calcium 8.9 8.9 - 10.3 mg/dL   Total Protein 7.3 6.5 - 8.1 g/dL   Albumin 3.7 3.5 - 5.0 g/dL   AST 24 15 - 41 U/L   ALT 19 17 - 63 U/L   Alkaline Phosphatase 44 38 - 126 U/L   Total Bilirubin 0.5 0.3 - 1.2 mg/dL   GFR calc non Af Amer 17 (L) >60 mL/min   GFR calc Af Amer 20 (L) >60 mL/min    Comment: (NOTE) The eGFR has been calculated using the CKD EPI equation. This calculation has not been validated in all clinical situations. eGFR's  persistently <60 mL/min signify possible Chronic Kidney Disease.    Anion gap 8 5 - 15  Lipase, blood     Status: Abnormal   Collection Time: 07/20/16 12:35 AM  Result Value Ref Range   Lipase 57 (H) 11 - 51 U/L  CBC with Differential     Status: Abnormal   Collection Time: 07/20/16 12:35 AM  Result Value Ref Range   WBC 7.5 4.0 - 10.5 K/uL   RBC 4.19 (L) 4.22 - 5.81 MIL/uL   Hemoglobin 12.9 (L) 13.0 - 17.0 g/dL   HCT 38.0 (L) 39.0 - 52.0 %   MCV 90.7 78.0 - 100.0 fL   MCH 30.8 26.0 - 34.0 pg   MCHC 33.9 30.0 - 36.0 g/dL   RDW 14.2 11.5 - 15.5 %   Platelets 125 (L) 150 - 400 K/uL   Neutrophils Relative % 60 %   Neutro Abs 4.5 1.7 - 7.7 K/uL   Lymphocytes Relative 18 %   Lymphs Abs 1.3 0.7 - 4.0 K/uL   Monocytes Relative 10 %   Monocytes Absolute 0.8 0.1 - 1.0 K/uL   Eosinophils Relative 11 %   Eosinophils Absolute 0.8 (H) 0.0 - 0.7 K/uL   Basophils Relative 1 %   Basophils Absolute 0.0 0.0 - 0.1 K/uL  Urinalysis, Routine w reflex microscopic     Status: Abnormal   Collection Time: 07/20/16  5:00 AM  Result Value Ref Range   Color, Urine YELLOW YELLOW   APPearance CLEAR CLEAR   Specific Gravity, Urine <1.005 (L) 1.005 - 1.030   pH 7.0 5.0 - 8.0   Glucose, UA NEGATIVE NEGATIVE mg/dL   Hgb urine dipstick NEGATIVE NEGATIVE   Bilirubin Urine NEGATIVE NEGATIVE   Ketones, ur NEGATIVE NEGATIVE mg/dL   Protein, ur NEGATIVE NEGATIVE mg/dL   Nitrite NEGATIVE NEGATIVE   Leukocytes, UA NEGATIVE NEGATIVE    Comment: MICROSCOPIC NOT DONE ON URINES WITH NEGATIVE PROTEIN, BLOOD, LEUKOCYTES, NITRITE, OR GLUCOSE <1000 mg/dL.  CBC     Status: Abnormal   Collection Time: 07/20/16  3:10 PM  Result Value Ref Range   WBC 4.6 4.0 - 10.5 K/uL   RBC 4.13 (L) 4.22 - 5.81 MIL/uL   Hemoglobin 12.5 (L) 13.0 - 17.0 g/dL   HCT 37.1 (L) 39.0 - 52.0 %   MCV 89.8 78.0 - 100.0 fL   MCH 30.3 26.0 - 34.0 pg   MCHC 33.7 30.0 - 36.0 g/dL   RDW 13.9 11.5 - 15.5 %   Platelets 113 (L) 150 - 400 K/uL     Comment: SPECIMEN CHECKED FOR CLOTS CONSISTENT WITH PREVIOUS RESULT   Creatinine, serum     Status: Abnormal   Collection Time: 07/20/16  3:10 PM  Result Value Ref Range   Creatinine, Ser 3.35 (H) 0.61 - 1.24 mg/dL   GFR calc non Af Amer 17 (L) >60 mL/min   GFR calc Af Amer 20 (L) >60 mL/min    Comment: (NOTE) The eGFR has been calculated using the CKD EPI equation. This calculation has not been validated in all clinical situations. eGFR's persistently <60 mL/min signify possible Chronic Kidney Disease.   TSH     Status: Abnormal   Collection Time: 07/20/16  3:10 PM  Result Value Ref Range   TSH 0.053 (L) 0.350 - 4.500 uIU/mL  Magnesium     Status: None   Collection Time: 07/20/16  3:10 PM  Result Value Ref Range   Magnesium 1.8 1.7 - 2.4 mg/dL  Glucose, capillary     Status: Abnormal   Collection Time: 07/20/16  4:46 PM  Result Value Ref Range   Glucose-Capillary 224 (H) 65 - 99 mg/dL   Comment 1 Notify RN    Comment 2 Document in Chart   Glucose, capillary     Status: Abnormal   Collection Time: 07/20/16  8:18 PM  Result Value Ref Range   Glucose-Capillary 150 (H) 65 - 99 mg/dL   Comment 1 Notify RN    Comment 2 Document in Chart   Basic metabolic panel     Status: Abnormal   Collection Time: 07/21/16  6:38 AM  Result Value Ref Range   Sodium 138 135 - 145 mmol/L   Potassium 4.2 3.5 - 5.1 mmol/L    Comment: DELTA CHECK NOTED   Chloride 98 (L) 101 - 111 mmol/L   CO2 27 22 - 32 mmol/L   Glucose, Bld 135 (H) 65 - 99 mg/dL   BUN 72 (H) 6 - 20 mg/dL   Creatinine, Ser 3.13 (H) 0.61 - 1.24 mg/dL   Calcium 8.7 (L) 8.9 - 10.3 mg/dL   GFR calc non Af Amer 19 (L) >60 mL/min   GFR calc Af Amer 21 (L) >60 mL/min    Comment: (NOTE) The eGFR has been calculated using the  CKD EPI equation. This calculation has not been validated in all clinical situations. eGFR's persistently <60 mL/min signify possible Chronic Kidney Disease.    Anion gap 13 5 - 15  CBC     Status: Abnormal    Collection Time: 07/21/16  6:38 AM  Result Value Ref Range   WBC 8.2 4.0 - 10.5 K/uL   RBC 3.88 (L) 4.22 - 5.81 MIL/uL   Hemoglobin 12.0 (L) 13.0 - 17.0 g/dL   HCT 35.0 (L) 39.0 - 52.0 %   MCV 90.2 78.0 - 100.0 fL   MCH 30.9 26.0 - 34.0 pg   MCHC 34.3 30.0 - 36.0 g/dL   RDW 14.0 11.5 - 15.5 %   Platelets 124 (L) 150 - 400 K/uL  Glucose, capillary     Status: Abnormal   Collection Time: 07/21/16  8:09 AM  Result Value Ref Range   Glucose-Capillary 143 (H) 65 - 99 mg/dL    ABGS No results for input(s): PHART, PO2ART, TCO2, HCO3 in the last 72 hours.  Invalid input(s): PCO2 CULTURES No results found for this or any previous visit (from the past 240 hour(s)). Studies/Results: Ct Abdomen Pelvis Wo Contrast  Result Date: 07/20/2016 CLINICAL DATA:  Epigastric abdominal pain for 2 days with nausea, vomiting and diarrhea. EXAM: CT ABDOMEN AND PELVIS WITHOUT CONTRAST TECHNIQUE: Multidetector CT imaging of the abdomen and pelvis was performed following the standard protocol without IV contrast. COMPARISON:  03/26/2016 unenhanced CT abdomen/ pelvis. FINDINGS: Lower chest: Right lower lobe 1.1 cm solid pulmonary nodule (series 3/ image 4), stable since at least 08/08/2014, considered benign. Hepatobiliary: Normal liver with no liver mass. Normal gallbladder with no radiopaque cholelithiasis. No biliary ductal dilatation. Pancreas: Normal, with no mass or duct dilation. Spleen: Normal size. No mass. Adrenals/Urinary Tract: Normal adrenals. Right nephrectomy. No mass or fluid collection in the right nephrectomy bed. No left hydronephrosis. Several hyperdense renal cortical lesions in the left kidney, largest 2.2 standard in the anterior interpolar left kidney, stable in size back to 08/08/2014, suggesting benign renal cysts. Stable minimally complex 1.4 cm renal cyst with mural calcification in the anterior lateral interpolar left kidney. Simple posterior medial 2.0 cm interpolar left renal cyst. No  left renal stones. Top normal caliber left ureter with no left ureteral stones. Collapsed and grossly normal bladder. Stomach/Bowel: Grossly normal stomach. Normal caliber small bowel with no small bowel wall thickening. Normal appendix. Normal large bowel with no diverticulosis, large bowel wall thickening or pericolonic fat stranding. Oral contrast progresses to the rectum. Vascular/Lymphatic: Atherosclerotic nonaneurysmal abdominal aorta. No pathologically enlarged lymph nodes in the abdomen or pelvis. Reproductive: Top-normal size prostate. Other: No pneumoperitoneum, ascites or focal fluid collection. Stable subcutaneous 9.0 cm benign appearing lipoma in the right lower chest wall. Musculoskeletal: No aggressive appearing focal osseous lesions. Mild-to-moderate thoracolumbar spondylosis. IMPRESSION: 1. No acute abnormality. No evidence of bowel obstruction or acute bowel inflammation. 2. Right nephrectomy with no abnormal findings in the right nephrectomy bed. Several mildly complex left renal cysts, all stable in size back 08/08/2014, suggesting benign renal cysts. 3. Aortic atherosclerosis. Electronically Signed   By: Ilona Sorrel M.D.   On: 07/20/2016 07:29   Dg Chest Port 1 View  Result Date: 07/20/2016 CLINICAL DATA:  Shortness breath.  Wheezing. EXAM: PORTABLE CHEST 1 VIEW COMPARISON:  06/11/2016 chest radiograph. FINDINGS: Stable cardiomediastinal silhouette with mild cardiomegaly and aortic arch atherosclerosis. No pneumothorax. No pleural effusion. Borderline mild pulmonary edema. No consolidative airspace disease. IMPRESSION: Borderline mild congestive heart failure.  Aortic atherosclerosis. Electronically Signed   By: Ilona Sorrel M.D.   On: 07/20/2016 08:41   Dg Abd 2 Views  Result Date: 07/20/2016 CLINICAL DATA:  Acute onset of upper abdominal pain, nausea, vomiting and diarrhea. Initial encounter. EXAM: ABDOMEN - 2 VIEW COMPARISON:  CT of the abdomen and pelvis performed 03/26/2016  FINDINGS: The visualized bowel gas pattern is unremarkable. Scattered stool and air are seen within the colon; there is no evidence of small bowel dilatation to suggest obstruction. No free intra-abdominal air is identified on the provided upright view. Scattered clips are seen overlying the right mid abdomen. No acute osseous abnormalities are seen; the sacroiliac joints are unremarkable in appearance. The visualized portions of the lungs are grossly clear. IMPRESSION: Unremarkable bowel gas pattern; no free intra-abdominal air seen. Electronically Signed   By: Garald Balding M.D.   On: 07/20/2016 01:49    Medications: I have reviewed the patient's current medications.  Assesment:  Principal Problem:   COPD exacerbation ([place]) Active Problems:   Obese   Hypertension   Sleep apnea   Hypothyroid   Seizure disorder ([employer])   CKD (chronic kidney disease), stage III   Abdominal pain, epigastric acute on chronic renal failure   Plan:  Medications reviewed Will continue IV steroid and nebulizer treatment Nephrology consult Will monitor CMP    LOS: 1 day   Jernie Schutt 07/21/2016, 8:15 AM

## 2016-07-21 NOTE — Care Management Important Message (Signed)
Important Message  Patient Details  Name: [name] [name] MRN: [id number] Date of Birth: 1946-06-22   Medicare Important Message Given:  Yes    Sherald Barge, RN 07/21/2016, 3:22 PM

## 2016-07-22 LAB — GLUCOSE, CAPILLARY
GLUCOSE-CAPILLARY: 113 mg/dL — AB (ref 65–99)
GLUCOSE-CAPILLARY: 129 mg/dL — AB (ref 65–99)
Glucose-Capillary: 92 mg/dL (ref 65–99)
Glucose-Capillary: 157 mg/dL — ABNORMAL HIGH (ref 65–99)

## 2016-07-22 LAB — COMPREHENSIVE METABOLIC PANEL
ALBUMIN: 3.6 g/dL (ref 3.5–5.0)
ALT: 16 U/L — ABNORMAL LOW (ref 17–63)
ANION GAP: 6 (ref 5–15)
AST: 17 U/L (ref 15–41)
Alkaline Phosphatase: 33 U/L — ABNORMAL LOW (ref 38–126)
BILIRUBIN TOTAL: 0.4 mg/dL (ref 0.3–1.2)
BUN: 72 mg/dL — ABNORMAL HIGH (ref 6–20)
CO2: 27 mmol/L (ref 22–32)
Calcium: 8.8 mg/dL — ABNORMAL LOW (ref 8.9–10.3)
Chloride: 100 mmol/L — ABNORMAL LOW (ref 101–111)
Creatinine, Ser: 2.88 mg/dL — ABNORMAL HIGH (ref 0.61–1.24)
GFR calc Af Amer: 24 mL/min — ABNORMAL LOW (ref >60)
GFR, EST NON AFRICAN AMERICAN: 21 mL/min — AB (ref >60)
Glucose, Bld: 110 mg/dL — ABNORMAL HIGH (ref 65–99)
POTASSIUM: 4.3 mmol/L (ref 3.5–5.1)
Sodium: 133 mmol/L — ABNORMAL LOW (ref 135–145)
TOTAL PROTEIN: 7.3 g/dL (ref 6.5–8.1)

## 2016-07-22 MED ORDER — TORSEMIDE 20 MG PO TABS
20.0000 mg | ORAL_TABLET | Freq: Every day | ORAL | Status: DC
  Administered 2016-07-22 – 2016-07-23 (×2): 20 mg via ORAL
  Filled 2016-07-22 (×2): qty 1

## 2016-07-22 MED ORDER — METHYLPREDNISOLONE SODIUM SUCC 40 MG IJ SOLR
40.0000 mg | Freq: Two times a day (BID) | INTRAMUSCULAR | Status: DC
  Administered 2016-07-22 – 2016-07-23 (×2): 40 mg via INTRAVENOUS
  Filled 2016-07-22 (×3): qty 1

## 2016-07-22 NOTE — Progress Notes (Signed)
Subjective: Patient feels better. His renal function is improving. No abdominal pain, nausea or vomiting.  Objective: Vital signs in last 24 hours: Temp:  [97.4 F (36.3 C)-99 F (37.2 C)] 98.4 F (36.9 C) (08/15 0603) Pulse Rate:  [82-85] 84 (08/15 0603) Resp:  [20-21] 20 (08/15 0603) BP: (118-134)/(49-69) 134/49 (08/15 0603) SpO2:  [96 %-100 %] 96 % (08/15 0759) Weight change:  Last BM Date: 07/21/16  Intake/Output from previous day: 08/14 0701 - 08/15 0700 In: 2520 [P.O.:720; I.V.:1800] Out: 1000 [Urine:1000]  PHYSICAL EXAM General appearance: alert and no distress Resp: diminished breath sounds bilaterally and wheezes bilaterally Cardio: S1, S2 normal GI: soft, non-tender; bowel sounds normal; no masses,  no organomegaly Extremities: extremities normal, atraumatic, no cyanosis or edema  Lab Results:  Results for orders placed or performed during the hospital encounter of 07/20/16 (from the past 48 hour(s))  CBC     Status: Abnormal   Collection Time: 07/20/16  3:10 PM  Result Value Ref Range   WBC 4.6 4.0 - 10.5 K/uL   RBC 4.13 (L) 4.22 - 5.81 MIL/uL   Hemoglobin 12.5 (L) 13.0 - 17.0 g/dL   HCT 37.1 (L) 39.0 - 52.0 %   MCV 89.8 78.0 - 100.0 fL   MCH 30.3 26.0 - 34.0 pg   MCHC 33.7 30.0 - 36.0 g/dL   RDW 13.9 11.5 - 15.5 %   Platelets 113 (L) 150 - 400 K/uL    Comment: SPECIMEN CHECKED FOR CLOTS CONSISTENT WITH PREVIOUS RESULT   Creatinine, serum     Status: Abnormal   Collection Time: 07/20/16  3:10 PM  Result Value Ref Range   Creatinine, Ser 3.35 (H) 0.61 - 1.24 mg/dL   GFR calc non Af Amer 17 (L) >60 mL/min   GFR calc Af Amer 20 (L) >60 mL/min    Comment: (NOTE) The eGFR has been calculated using the CKD EPI equation. This calculation has not been validated in all clinical situations. eGFR's persistently <60 mL/min signify possible Chronic Kidney Disease.   TSH     Status: Abnormal   Collection Time: 07/20/16  3:10 PM  Result Value Ref Range   TSH  0.053 (L) 0.350 - 4.500 uIU/mL  Hemoglobin A1c     Status: Abnormal   Collection Time: 07/20/16  3:10 PM  Result Value Ref Range   Hgb A1c MFr Bld 6.0 (H) 4.8 - 5.6 %    Comment: (NOTE)         Pre-diabetes: 5.7 - 6.4         Diabetes: >6.4         Glycemic control for adults with diabetes: <7.0    Mean Plasma Glucose 126 mg/dL    Comment: (NOTE) Performed At: [facility] [place], [place] [id number] Lindon Romp MD [id number]   Magnesium     Status: None   Collection Time: 07/20/16  3:10 PM  Result Value Ref Range   Magnesium 1.8 1.7 - 2.4 mg/dL  Glucose, capillary     Status: Abnormal   Collection Time: 07/20/16  4:46 PM  Result Value Ref Range   Glucose-Capillary 224 (H) 65 - 99 mg/dL   Comment 1 Notify RN    Comment 2 Document in Chart   Glucose, capillary     Status: Abnormal   Collection Time: 07/20/16  8:18 PM  Result Value Ref Range   Glucose-Capillary 150 (H) 65 - 99 mg/dL   Comment 1 Notify RN  Comment 2 Document in Chart   Basic metabolic panel     Status: Abnormal   Collection Time: 07/21/16  6:38 AM  Result Value Ref Range   Sodium 138 135 - 145 mmol/L   Potassium 4.2 3.5 - 5.1 mmol/L    Comment: DELTA CHECK NOTED   Chloride 98 (L) 101 - 111 mmol/L   CO2 27 22 - 32 mmol/L   Glucose, Bld 135 (H) 65 - 99 mg/dL   BUN 72 (H) 6 - 20 mg/dL   Creatinine, Ser 3.13 (H) 0.61 - 1.24 mg/dL   Calcium 8.7 (L) 8.9 - 10.3 mg/dL   GFR calc non Af Amer 19 (L) >60 mL/min   GFR calc Af Amer 21 (L) >60 mL/min    Comment: (NOTE) The eGFR has been calculated using the CKD EPI equation. This calculation has not been validated in all clinical situations. eGFR's persistently <60 mL/min signify possible Chronic Kidney Disease.    Anion gap 13 5 - 15  CBC     Status: Abnormal   Collection Time: 07/21/16  6:38 AM  Result Value Ref Range   WBC 8.2 4.0 - 10.5 K/uL   RBC 3.88 (L) 4.22 - 5.81 MIL/uL   Hemoglobin 12.0 (L) 13.0 - 17.0 g/dL   HCT  35.0 (L) 39.0 - 52.0 %   MCV 90.2 78.0 - 100.0 fL   MCH 30.9 26.0 - 34.0 pg   MCHC 34.3 30.0 - 36.0 g/dL   RDW 14.0 11.5 - 15.5 %   Platelets 124 (L) 150 - 400 K/uL  Glucose, capillary     Status: Abnormal   Collection Time: 07/21/16  8:09 AM  Result Value Ref Range   Glucose-Capillary 143 (H) 65 - 99 mg/dL  Glucose, capillary     Status: Abnormal   Collection Time: 07/21/16 11:33 AM  Result Value Ref Range   Glucose-Capillary 135 (H) 65 - 99 mg/dL  Glucose, capillary     Status: Abnormal   Collection Time: 07/21/16  5:00 PM  Result Value Ref Range   Glucose-Capillary 163 (H) 65 - 99 mg/dL  Glucose, capillary     Status: Abnormal   Collection Time: 07/21/16  9:16 PM  Result Value Ref Range   Glucose-Capillary 136 (H) 65 - 99 mg/dL  Comprehensive metabolic panel     Status: Abnormal   Collection Time: 07/22/16  6:43 AM  Result Value Ref Range   Sodium 133 (L) 135 - 145 mmol/L   Potassium 4.3 3.5 - 5.1 mmol/L   Chloride 100 (L) 101 - 111 mmol/L   CO2 27 22 - 32 mmol/L   Glucose, Bld 110 (H) 65 - 99 mg/dL   BUN 72 (H) 6 - 20 mg/dL   Creatinine, Ser 2.88 (H) 0.61 - 1.24 mg/dL   Calcium 8.8 (L) 8.9 - 10.3 mg/dL   Total Protein 7.3 6.5 - 8.1 g/dL   Albumin 3.6 3.5 - 5.0 g/dL   AST 17 15 - 41 U/L   ALT 16 (L) 17 - 63 U/L   Alkaline Phosphatase 33 (L) 38 - 126 U/L   Total Bilirubin 0.4 0.3 - 1.2 mg/dL   GFR calc non Af Amer 21 (L) >60 mL/min   GFR calc Af Amer 24 (L) >60 mL/min    Comment: (NOTE) The eGFR has been calculated using the CKD EPI equation. This calculation has not been validated in all clinical situations. eGFR's persistently <60 mL/min signify possible Chronic Kidney Disease.    Anion gap  6 5 - 15  Glucose, capillary     Status: Abnormal   Collection Time: 07/22/16  7:24 AM  Result Value Ref Range   Glucose-Capillary 113 (H) 65 - 99 mg/dL   Comment 1 Notify RN    Comment 2 Document in Chart     ABGS No results for input(s): PHART, PO2ART, TCO2, HCO3 in  the last 72 hours.  Invalid input(s): PCO2 CULTURES No results found for this or any previous visit (from the past 240 hour(s)). Studies/Results: No results found.  Medications: I have reviewed the patient's current medications.  Assesment:  Principal Problem:   COPD exacerbation ([place]) Active Problems:   Obese   Hypertension   Sleep apnea   Hypothyroid   Seizure disorder ([place])   CKD (chronic kidney disease), stage III   Abdominal pain, epigastric acute on chronic renal failure   Plan:  Medications reviewed Will continue IV steroid and nebulizer treatment Nephrology consult appreciated Will monitor CMP    LOS: 2 days   Karlyn Glasco 07/22/2016, 8:15 AM

## 2016-07-22 NOTE — Progress Notes (Signed)
Subjective: Interval History: has no complaint of difficulty in breathing. Patient also denies any dizziness or lightheadedness. His appetite is good.  Objective: Vital signs in last 24 hours: Temp:  [97.4 F (36.3 C)-99 F (37.2 C)] 98.4 F (36.9 C) (08/15 0603) Pulse Rate:  [82-85] 84 (08/15 0603) Resp:  [20-21] 20 (08/15 0603) BP: (118-134)/(49-69) 134/49 (08/15 0603) SpO2:  [96 %-100 %] 96 % (08/15 0759) Weight change:   Intake/Output from previous day: 08/14 0701 - 08/15 0700 In: 2520 [P.O.:720; I.V.:1800] Out: 1000 [Urine:1000] Intake/Output this shift: No intake/output data recorded.  General appearance: alert, cooperative and no distress Resp: diminished breath sounds bilaterally Cardio: regular rate and rhythm and S1, S2 normal GI: Obese, nontender and positive bowel sound Extremities: edema Trace edema bilaterally  Lab Results:  Recent Labs  07/20/16 1510 07/21/16 0638  WBC 4.6 8.2  HGB 12.5* 12.0*  HCT 37.1* 35.0*  PLT 113* 124*   BMET:  Recent Labs  07/21/16 0638 07/22/16 0643  NA 138 133*  K 4.2 4.3  CL 98* 100*  CO2 27 27  GLUCOSE 135* 110*  BUN 72* 72*  CREATININE 3.13* 2.88*  CALCIUM 8.7* 8.8*   No results for input(s): PTH in the last 72 hours. Iron Studies: No results for input(s): IRON, TIBC, TRANSFERRIN, FERRITIN in the last 72 hours.  Studies/Results: No results found.  I have reviewed the patient's current medications.  Assessment/Plan: Problem #1 acute kidney injury superimposed on chronic presently his BUN and creatinine is improving. His creatinine has returned to his baseline and patient is asymptomatic. Problem  #2   chronic renal failure: Stage IV  Problem #3 anemia: His hemoglobin is above our target goal and stable Problem #4 history of CHF: Presently patient doesn't have any sign of fluid overload. He had about 1000 mL of urine output. Problem #5 metabolic bone disease: His calcium and phosphorus is range. Presently  patient is not on any binder. Problem #6 hypertension: His blood pressure is reasonably controlled Problem # 7 history of COPD patient is on inhaler and asymptomatic Plan: We'll DC IV fluid 2] will start patient on Demadex 20 mg once a day 3] patient has a follow-up appointment was asked as end of this month and was told to keep that appointment.   LOS: 2 days   Jasmarie Coppock S 07/22/2016,8:45 AM

## 2016-07-23 LAB — GLUCOSE, CAPILLARY
GLUCOSE-CAPILLARY: 116 mg/dL — AB (ref 65–99)
GLUCOSE-CAPILLARY: 155 mg/dL — AB (ref 65–99)

## 2016-07-23 LAB — RENAL FUNCTION PANEL
ANION GAP: 9 (ref 5–15)
Albumin: 3.6 g/dL (ref 3.5–5.0)
BUN: 78 mg/dL — ABNORMAL HIGH (ref 6–20)
CHLORIDE: 100 mmol/L — AB (ref 101–111)
CO2: 24 mmol/L (ref 22–32)
Calcium: 8.7 mg/dL — ABNORMAL LOW (ref 8.9–10.3)
Creatinine, Ser: 2.76 mg/dL — ABNORMAL HIGH (ref 0.61–1.24)
GFR, EST AFRICAN AMERICAN: 25 mL/min — AB (ref >60)
GFR, EST NON AFRICAN AMERICAN: 22 mL/min — AB (ref >60)
Glucose, Bld: 94 mg/dL (ref 65–99)
POTASSIUM: 3.6 mmol/L (ref 3.5–5.1)
Phosphorus: 2.7 mg/dL (ref 2.5–4.6)
Sodium: 133 mmol/L — ABNORMAL LOW (ref 135–145)

## 2016-07-23 MED ORDER — PREDNISONE 10 MG (21) PO TBPK: 10.0000 mg | ORAL_TABLET | Freq: Every day | ORAL | 0 refills | Status: DC

## 2016-07-23 NOTE — Care Management Note (Signed)
Case Management Note  Patient Details  Name: [name] [name] MRN: [id number] Date of Birth: Jun 06, 1946  Subjective/Objective:                  Admitted with COPD exacerbation. [name] from home, lives alone with strong family support when needed. He has home oxygen with port tanks, neb machine and CPAP. He has walker to use if he needs. He has no HH services PTA and refuses them at DC. [name] discharging home today with self care.   Action/Plan: No CM needs.   Expected Discharge Date:      07/23/2016            Expected Discharge Plan:  Home/Self Care  In-House Referral:  NA  Discharge planning Services  CM Consult  Post Acute Care Choice:  NA Choice offered to:  NA  DME Arranged:    DME Agency:     HH Arranged:    HH Agency:     Status of Service:  Completed, signed off  If discussed at [employer] of Stay Meetings, dates discussed:    Additional Comments:  Sherald Barge, RN 07/23/2016, 9:02 AM

## 2016-07-23 NOTE — Progress Notes (Signed)
Discharge instructions including medication instructions reviewed with patient.  Patient verbalized understanding of all. Discharged to home with family.

## 2016-07-23 NOTE — Discharge Summary (Signed)
Physician Discharge Summary  Patient ID: [name] [name] MRN: [id number] DOB/AGE: September 07, 1946 70 y.o. Primary Care Physician:Marsean Elkhatib, MD Admit date: 07/20/2016 Discharge date: 07/23/2016    Discharge Diagnoses:   Principal Problem:   COPD exacerbation ([place]) Active Problems:   Obese   Hypertension   Sleep apnea   Hypothyroid   Seizure disorder ([place])   CKD (chronic kidney disease), stage III   Abdominal pain, epigastric     Medication List    STOP taking these medications   metolazone 5 MG tablet Commonly known as:  ZAROXOLYN     TAKE these medications   ADVAIR DISKUS 250-50 MCG/DOSE Aepb Generic drug:  Fluticasone-Salmeterol Inhale 1 puff into the lungs 2 (two) times daily.   albuterol 108 (90 Base) MCG/ACT inhaler Commonly known as:  PROVENTIL HFA;VENTOLIN HFA Inhale 2 puffs into the lungs every 4 (four) hours as needed for wheezing or shortness of breath.   albuterol (2.5 MG/3ML) 0.083% nebulizer solution Commonly known as:  PROVENTIL Inhale 3 mLs into the lungs every 6 (six) hours as needed. Shortness of breath/wheezing   amLODipine 5 MG tablet Commonly known as:  NORVASC Take 1 tablet by mouth daily.   calcitRIOL 0.5 MCG capsule Commonly known as:  ROCALTROL Take 0.5 mcg by mouth 2 (two) times daily.   cloNIDine 0.2 MG tablet Commonly known as:  CATAPRES TAKE (1) TABLET BY MOUTH (3) TIMES DAILY   desmopressin 0.2 MG tablet Commonly known as:  DDAVP Take 1 tablet (0.2 mg total) by mouth 2 (two) times daily.   fluticasone 50 MCG/ACT nasal spray Commonly known as:  FLONASE Place 2 sprays into both nostrils daily.   isosorbide-hydrALAZINE 20-37.5 MG tablet Commonly known as:  BIDIL Take 1 tablet by mouth 3 (three) times daily.   levothyroxine 175 MCG tablet Commonly known as:  SYNTHROID, LEVOTHROID TAKE ONE TABLET BY MOUTH ONCE DAILY.   pantoprazole 40 MG tablet Commonly known as:  PROTONIX Take 1 tablet (40 mg total) by mouth 2 (two) times  daily before a meal. What changed:  when to take this   phenytoin 100 MG ER capsule Commonly known as:  DILANTIN Take 200-300 mg by mouth daily. Takes 200 mg on Monday and Thursdsay. On all other days of the week takes 300 mg.   polyethylene glycol powder powder Commonly known as:  GLYCOLAX/MIRALAX Take 17 g by mouth daily. May decrease to 17 g three times a week if needed. What changed:  additional instructions   potassium chloride SA 20 MEQ tablet Commonly known as:  K-DUR,KLOR-CON Take 20 mEq by mouth 2 (two) times daily.   predniSONE 10 MG (21) Tbpk tablet Commonly known as:  STERAPRED UNI-PAK 21 TAB Take 1 tablet (10 mg total) by mouth daily. 4 tab po daily for 3 days, then 3 tab po daily for 3 days, then 2 tab daily for 3 days then 1 tab po daily for 3 days then stop What changed:  additional instructions   simvastatin 20 MG tablet Commonly known as:  ZOCOR Take 20 mg by mouth daily.   tiotropium 18 MCG inhalation capsule Commonly known as:  SPIRIVA Place 18 mcg into inhaler and inhale daily.   torsemide 20 MG tablet Commonly known as:  DEMADEX Take 2 tablets (40 mg total) by mouth 2 (two) times daily.       Discharged Condition: improved    Consults: nephrology  Significant Diagnostic Studies: Ct Abdomen Pelvis Wo Contrast  Result Date: 07/20/2016 CLINICAL DATA:  Epigastric abdominal  pain for 2 days with nausea, vomiting and diarrhea. EXAM: CT ABDOMEN AND PELVIS WITHOUT CONTRAST TECHNIQUE: Multidetector CT imaging of the abdomen and pelvis was performed following the standard protocol without IV contrast. COMPARISON:  03/26/2016 unenhanced CT abdomen/ pelvis. FINDINGS: Lower chest: Right lower lobe 1.1 cm solid pulmonary nodule (series 3/ image 4), stable since at least 08/08/2014, considered benign. Hepatobiliary: Normal liver with no liver mass. Normal gallbladder with no radiopaque cholelithiasis. No biliary ductal dilatation. Pancreas: Normal, with no mass or  duct dilation. Spleen: Normal size. No mass. Adrenals/Urinary Tract: Normal adrenals. Right nephrectomy. No mass or fluid collection in the right nephrectomy bed. No left hydronephrosis. Several hyperdense renal cortical lesions in the left kidney, largest 2.2 standard in the anterior interpolar left kidney, stable in size back to 08/08/2014, suggesting benign renal cysts. Stable minimally complex 1.4 cm renal cyst with mural calcification in the anterior lateral interpolar left kidney. Simple posterior medial 2.0 cm interpolar left renal cyst. No left renal stones. Top normal caliber left ureter with no left ureteral stones. Collapsed and grossly normal bladder. Stomach/Bowel: Grossly normal stomach. Normal caliber small bowel with no small bowel wall thickening. Normal appendix. Normal large bowel with no diverticulosis, large bowel wall thickening or pericolonic fat stranding. Oral contrast progresses to the rectum. Vascular/Lymphatic: Atherosclerotic nonaneurysmal abdominal aorta. No pathologically enlarged lymph nodes in the abdomen or pelvis. Reproductive: Top-normal size prostate. Other: No pneumoperitoneum, ascites or focal fluid collection. Stable subcutaneous 9.0 cm benign appearing lipoma in the right lower chest wall. Musculoskeletal: No aggressive appearing focal osseous lesions. Mild-to-moderate thoracolumbar spondylosis. IMPRESSION: 1. No acute abnormality. No evidence of bowel obstruction or acute bowel inflammation. 2. Right nephrectomy with no abnormal findings in the right nephrectomy bed. Several mildly complex left renal cysts, all stable in size back 08/08/2014, suggesting benign renal cysts. 3. Aortic atherosclerosis. Electronically Signed   By: Ilona Sorrel M.D.   On: 07/20/2016 07:29   Dg Chest Port 1 View  Result Date: 07/20/2016 CLINICAL DATA:  Shortness breath.  Wheezing. EXAM: PORTABLE CHEST 1 VIEW COMPARISON:  06/11/2016 chest radiograph. FINDINGS: Stable cardiomediastinal  silhouette with mild cardiomegaly and aortic arch atherosclerosis. No pneumothorax. No pleural effusion. Borderline mild pulmonary edema. No consolidative airspace disease. IMPRESSION: Borderline mild congestive heart failure. Aortic atherosclerosis. Electronically Signed   By: Ilona Sorrel M.D.   On: 07/20/2016 08:41   Dg Abd 2 Views  Result Date: 07/20/2016 CLINICAL DATA:  Acute onset of upper abdominal pain, nausea, vomiting and diarrhea. Initial encounter. EXAM: ABDOMEN - 2 VIEW COMPARISON:  CT of the abdomen and pelvis performed 03/26/2016 FINDINGS: The visualized bowel gas pattern is unremarkable. Scattered stool and air are seen within the colon; there is no evidence of small bowel dilatation to suggest obstruction. No free intra-abdominal air is identified on the provided upright view. Scattered clips are seen overlying the right mid abdomen. No acute osseous abnormalities are seen; the sacroiliac joints are unremarkable in appearance. The visualized portions of the lungs are grossly clear. IMPRESSION: Unremarkable bowel gas pattern; no free intra-abdominal air seen. Electronically Signed   By: Garald Balding M.D.   On: 07/20/2016 01:49    Lab Results: Basic Metabolic Panel:  Recent Labs  07/20/16 1510  07/22/16 0643 07/23/16 0706  NA  --   < > 133* 133*  K  --   < > 4.3 3.6  CL  --   < > 100* 100*  CO2  --   < > 27 24  GLUCOSE  --   < > 110* 94  BUN  --   < > 72* 78*  CREATININE 3.35*  < > 2.88* 2.76*  CALCIUM  --   < > 8.8* 8.7*  MG 1.8  --   --   --   PHOS  --   --   --  2.7  < > = values in this interval not displayed. Liver Function Tests:  Recent Labs  07/22/16 0643 07/23/16 0706  AST 17  --   ALT 16*  --   ALKPHOS 33*  --   BILITOT 0.4  --   PROT 7.3  --   ALBUMIN 3.6 3.6     CBC:  Recent Labs  07/20/16 1510 07/21/16 0638  WBC 4.6 8.2  HGB 12.5* 12.0*  HCT 37.1* 35.0*  MCV 89.8 90.2  PLT 113* 124*    No results found for this or any previous visit  (from the past 240 hour(s)).   Hospital Course:   Patient was admitted due to abdominal pain. The pain was in the epigastric area which resolved after admission. Patient was also found to have COPD excerebration and worseing renal failure. He was hydrated and nephrology consult was done. Patient also received IV steroid and nebulizer treatment. His renal function improved to his baseline and his diuretics adjusted. His breathing improved and patient is discharged on oral steroid and to continue follow up in out patient.   Discharge Exam: Blood pressure (!) 137/56, pulse 87, temperature 98.7 F (37.1 C), temperature source Oral, resp. rate 18, height 6\' 1"  (1.854 m), weight 111.1 kg (245 lb), SpO2 96 %.   Disposition:  home    Follow-up Information    Brennley Curtice, MD Follow up in 2 week(s).   Specialty:  Internal Medicine Contact information: [address] [place] [address] [phone]           Signed: [name]   07/23/2016, 8:34 AM

## 2016-07-23 NOTE — Plan of Care (Signed)
Problem: Pain Management: Goal: Expressions of feelings of enhanced comfort will increase Outcome: Completed/Met Date Met: 07/23/16 Denies pain

## 2016-07-23 NOTE — Progress Notes (Signed)
[name] [name]  MRN: [id number]  DOB/AGE: 72/01/1946 70 y.o.  Primary Care Physician:FANTA,TESFAYE, MD  Admit date: 07/20/2016  Chief Complaint:  Chief Complaint  Patient presents with  . Abdominal Pain    S-Pt presented on  07/20/2016 with  Chief Complaint  Patient presents with  . Abdominal Pain  .    Pt says he is feeling better   Pt says he is happy as he is going home today  Meds . amLODipine  5 mg Oral Daily  . calcitRIOL  0.5 mcg Oral BID  . cloNIDine  0.2 mg Oral TID  . desmopressin  0.2 mg Oral BID  . enoxaparin (LOVENOX) injection  50 mg Subcutaneous Q24H  . insulin aspart  0-15 Units Subcutaneous TID WC  . insulin aspart  0-5 Units Subcutaneous QHS  . insulin aspart  4 Units Subcutaneous TID WC  . levofloxacin  500 mg Oral Q48H  . levothyroxine  175 mcg Oral QAC breakfast  . loratadine  10 mg Oral Daily  . methylPREDNISolone (SOLU-MEDROL) injection  40 mg Intravenous Q12H  . mometasone-formoterol  2 puff Inhalation BID  . pantoprazole  40 mg Oral BID  . phenytoin  200 mg Oral Once per day on Mon Thu  . phenytoin  300 mg Oral Once per day on Sun Tue Wed Fri Sat  . simvastatin  20 mg Oral q1800  . tiotropium  18 mcg Inhalation Daily  . torsemide  20 mg Oral Daily      Physical Exam: Vital signs in last 24 hours: Temp:  [98.7 F (37.1 C)-98.8 F (37.1 C)] 98.7 F (37.1 C) (08/16 0500) Pulse Rate:  [79-87] 87 (08/16 0500) Resp:  [18-20] 18 (08/16 0500) BP: (137-144)/(49-56) 137/56 (08/16 0500) SpO2:  [96 %-99 %] 96 % (08/16 0735) Weight change:  Last BM Date: 07/22/16  Intake/Output from previous day: 08/15 0701 - 08/16 0700 In: 720 [P.O.:720] Out: 1900 [Urine:1900] Total I/O In: 120 [P.O.:120] Out: -    Physical Exam: General- pt is awake,alert, oriented to time place and person Resp- No acute REsp distress, decreased at bases.Ronchi+  CVS- S1S2 regular in rate and rhythm, Systolic Ejection Murmur + GIT- BS+, soft, NT, ND EXT- 1+ LE Edema,  NO Cyanosis   Lab Results: CBC  Recent Labs  07/20/16 1510 07/21/16 0638  WBC 4.6 8.2  HGB 12.5* 12.0*  HCT 37.1* 35.0*  PLT 113* 124*    BMET  Recent Labs  07/22/16 0643 07/23/16 0706  NA 133* 133*  K 4.3 3.6  CL 100* 100*  CO2 27 24  GLUCOSE 110* 94  BUN 72* 78*  CREATININE 2.88* 2.76*  CALCIUM 8.8* 8.7*   Trend Creat 2017  3.38=>2.88          3.9=>2.8( Jan admission ) 2016  2.3--2.9 2015  2.2--3.0 2014  2.0--2.5           ( This admission 2.2--2.4) 2013  2.1--2.5 2012  1.5--1.7 2011  1.8   MICRO No results found for this or any previous visit (from the past 240 hour(s)).    Lab Results  Component Value Date   PTH 230.9 (H) 09/09/2012   CALCIUM 8.7 (L) 07/23/2016   PHOS 2.7 07/23/2016         Impression: 1)Renal  AKI secondary to Prerenal/ CHF  AKI on CKD  CKD stage 4 . CKD since 2011- MOst likley before that CKD secondary to HTN/ Cardiorenal/Multiple AKI Progression of CKD marked with multiple AKI  AKI now bette   2)HTN Target Organ damage  CKD CHF Medication-  On Diuretics- On Calcium Channel Blockers On Central Acting Sympatholytics On Diuretics   3)Anemia HGb at goal (9--11)   4)CKD Mineral-Bone Disorder PTH elevated. Secondary Hyperparathyroidism present. Phosphorus at goal.   5)CVS  CHF  Aortic Regurgitaion   Cardiology following  6)Electrolytes  Normokalemic  Hyponatremic  7)Acid base Co2 at goal  8) GI-admitted with epigastric abdominal pain   Primary team follwoing   Plan:  Will continue current care.     Pedram Goodchild S 07/23/2016, 9:33 AM

## 2016-07-24 ENCOUNTER — Other Ambulatory Visit: Payer: Self-pay | Admitting: Cardiovascular Disease

## 2016-08-03 ENCOUNTER — Inpatient Hospital Stay (HOSPITAL_COMMUNITY)
Admission: EM | Admit: 2016-08-03 | Discharge: 2016-08-08 | DRG: 312 | Disposition: A | Discharge status: Skilled Nursing Facility | Payer: Medicare HMO | Attending: Internal Medicine | Admitting: Internal Medicine

## 2016-08-03 ENCOUNTER — Emergency Department (HOSPITAL_COMMUNITY): Payer: Medicare HMO

## 2016-08-03 ENCOUNTER — Encounter (HOSPITAL_COMMUNITY): Payer: Self-pay | Admitting: Emergency Medicine

## 2016-08-03 DIAGNOSIS — I951 Orthostatic hypotension: Secondary | ICD-10-CM | POA: Diagnosis not present

## 2016-08-03 DIAGNOSIS — Z809 Family history of malignant neoplasm, unspecified: Secondary | ICD-10-CM

## 2016-08-03 DIAGNOSIS — E876 Hypokalemia: Secondary | ICD-10-CM | POA: Diagnosis present

## 2016-08-03 DIAGNOSIS — W19XXXA Unspecified fall, initial encounter: Secondary | ICD-10-CM | POA: Diagnosis present

## 2016-08-03 DIAGNOSIS — I5042 Chronic combined systolic (congestive) and diastolic (congestive) heart failure: Secondary | ICD-10-CM | POA: Diagnosis not present

## 2016-08-03 DIAGNOSIS — J9611 Chronic respiratory failure with hypoxia: Secondary | ICD-10-CM | POA: Diagnosis present

## 2016-08-03 DIAGNOSIS — Z85528 Personal history of other malignant neoplasm of kidney: Secondary | ICD-10-CM

## 2016-08-03 DIAGNOSIS — E669 Obesity, unspecified: Secondary | ICD-10-CM | POA: Diagnosis present

## 2016-08-03 DIAGNOSIS — R55 Syncope and collapse: Secondary | ICD-10-CM | POA: Diagnosis present

## 2016-08-03 DIAGNOSIS — J449 Chronic obstructive pulmonary disease, unspecified: Secondary | ICD-10-CM | POA: Diagnosis not present

## 2016-08-03 DIAGNOSIS — Z905 Acquired absence of kidney: Secondary | ICD-10-CM

## 2016-08-03 DIAGNOSIS — N184 Chronic kidney disease, stage 4 (severe): Secondary | ICD-10-CM | POA: Diagnosis not present

## 2016-08-03 DIAGNOSIS — Z87891 Personal history of nicotine dependence: Secondary | ICD-10-CM

## 2016-08-03 DIAGNOSIS — E1122 Type 2 diabetes mellitus with diabetic chronic kidney disease: Secondary | ICD-10-CM | POA: Diagnosis present

## 2016-08-03 DIAGNOSIS — E039 Hypothyroidism, unspecified: Secondary | ICD-10-CM | POA: Diagnosis present

## 2016-08-03 DIAGNOSIS — R778 Other specified abnormalities of plasma proteins: Secondary | ICD-10-CM | POA: Diagnosis present

## 2016-08-03 DIAGNOSIS — I10 Essential (primary) hypertension: Secondary | ICD-10-CM

## 2016-08-03 DIAGNOSIS — E230 Hypopituitarism: Secondary | ICD-10-CM | POA: Diagnosis present

## 2016-08-03 DIAGNOSIS — E785 Hyperlipidemia, unspecified: Secondary | ICD-10-CM | POA: Diagnosis present

## 2016-08-03 DIAGNOSIS — R52 Pain, unspecified: Secondary | ICD-10-CM

## 2016-08-03 DIAGNOSIS — Z8249 Family history of ischemic heart disease and other diseases of the circulatory system: Secondary | ICD-10-CM

## 2016-08-03 DIAGNOSIS — Z9981 Dependence on supplemental oxygen: Secondary | ICD-10-CM

## 2016-08-03 DIAGNOSIS — D649 Anemia, unspecified: Secondary | ICD-10-CM | POA: Diagnosis present

## 2016-08-03 DIAGNOSIS — M25462 Effusion, left knee: Secondary | ICD-10-CM | POA: Diagnosis present

## 2016-08-03 DIAGNOSIS — G4730 Sleep apnea, unspecified: Secondary | ICD-10-CM | POA: Diagnosis present

## 2016-08-03 DIAGNOSIS — Z9119 Patient's noncompliance with other medical treatment and regimen: Secondary | ICD-10-CM

## 2016-08-03 DIAGNOSIS — E032 Hypothyroidism due to medicaments and other exogenous substances: Secondary | ICD-10-CM

## 2016-08-03 DIAGNOSIS — I351 Nonrheumatic aortic (valve) insufficiency: Secondary | ICD-10-CM | POA: Diagnosis present

## 2016-08-03 DIAGNOSIS — M25562 Pain in left knee: Secondary | ICD-10-CM | POA: Diagnosis present

## 2016-08-03 DIAGNOSIS — Z6832 Body mass index (BMI) 32.0-32.9, adult: Secondary | ICD-10-CM

## 2016-08-03 DIAGNOSIS — I2510 Atherosclerotic heart disease of native coronary artery without angina pectoris: Secondary | ICD-10-CM | POA: Diagnosis present

## 2016-08-03 DIAGNOSIS — I272 Other secondary pulmonary hypertension: Secondary | ICD-10-CM | POA: Diagnosis present

## 2016-08-03 DIAGNOSIS — G40909 Epilepsy, unspecified, not intractable, without status epilepticus: Secondary | ICD-10-CM | POA: Diagnosis present

## 2016-08-03 DIAGNOSIS — E119 Type 2 diabetes mellitus without complications: Secondary | ICD-10-CM

## 2016-08-03 DIAGNOSIS — I130 Hypertensive heart and chronic kidney disease with heart failure and stage 1 through stage 4 chronic kidney disease, or unspecified chronic kidney disease: Secondary | ICD-10-CM | POA: Diagnosis present

## 2016-08-03 DIAGNOSIS — Y92009 Unspecified place in unspecified non-institutional (private) residence as the place of occurrence of the external cause: Secondary | ICD-10-CM

## 2016-08-03 DIAGNOSIS — R7989 Other specified abnormal findings of blood chemistry: Secondary | ICD-10-CM

## 2016-08-03 LAB — COMPREHENSIVE METABOLIC PANEL
ALK PHOS: 38 U/L (ref 38–126)
ALT: 21 U/L (ref 17–63)
AST: 18 U/L (ref 15–41)
Albumin: 4.1 g/dL (ref 3.5–5.0)
Anion gap: 12 (ref 5–15)
BILIRUBIN TOTAL: 1 mg/dL (ref 0.3–1.2)
BUN: 91 mg/dL — AB (ref 6–20)
CALCIUM: 9.4 mg/dL (ref 8.9–10.3)
CO2: 28 mmol/L (ref 22–32)
Chloride: 97 mmol/L — ABNORMAL LOW (ref 101–111)
Creatinine, Ser: 2.52 mg/dL — ABNORMAL HIGH (ref 0.61–1.24)
GFR, EST AFRICAN AMERICAN: 28 mL/min — AB (ref >60)
GFR, EST NON AFRICAN AMERICAN: 24 mL/min — AB (ref >60)
Glucose, Bld: 109 mg/dL — ABNORMAL HIGH (ref 65–99)
Potassium: 2.9 mmol/L — ABNORMAL LOW (ref 3.5–5.1)
Sodium: 137 mmol/L (ref 135–145)
TOTAL PROTEIN: 8.1 g/dL (ref 6.5–8.1)

## 2016-08-03 LAB — I-STAT TROPONIN, ED: TROPONIN I, POC: 0.09 ng/mL — AB (ref 0.00–0.08)

## 2016-08-03 LAB — CBC WITH DIFFERENTIAL/PLATELET
Basophils Absolute: 0.1 10*3/uL (ref 0.0–0.1)
Basophils Relative: 0 %
EOS PCT: 5 %
Eosinophils Absolute: 0.6 10*3/uL (ref 0.0–0.7)
HEMATOCRIT: 38.3 % — AB (ref 39.0–52.0)
HEMOGLOBIN: 13 g/dL (ref 13.0–17.0)
LYMPHS ABS: 1.2 10*3/uL (ref 0.7–4.0)
LYMPHS PCT: 9 %
MCH: 30.7 pg (ref 26.0–34.0)
MCHC: 33.9 g/dL (ref 30.0–36.0)
MCV: 90.5 fL (ref 78.0–100.0)
Monocytes Absolute: 1.1 10*3/uL — ABNORMAL HIGH (ref 0.1–1.0)
Monocytes Relative: 8 %
NEUTROS ABS: 9.8 10*3/uL — AB (ref 1.7–7.7)
Neutrophils Relative %: 78 %
PLATELETS: 133 10*3/uL — AB (ref 150–400)
RBC: 4.23 MIL/uL (ref 4.22–5.81)
RDW: 14.7 % (ref 11.5–15.5)
WBC: 12.7 10*3/uL — AB (ref 4.0–10.5)

## 2016-08-03 LAB — PROTIME-INR
INR: 1.2
PROTHROMBIN TIME: 15.3 s — AB (ref 11.4–15.2)

## 2016-08-03 LAB — TROPONIN I: Troponin I: 0.07 ng/mL (ref <0.03)

## 2016-08-03 LAB — APTT: APTT: 40 s — AB (ref 24–36)

## 2016-08-03 MED ORDER — PHENYTOIN SODIUM EXTENDED 100 MG PO CAPS
200.0000 mg | ORAL_CAPSULE | ORAL | Status: DC
  Administered 2016-08-04 – 2016-08-07 (×2): 200 mg via ORAL
  Filled 2016-08-03 (×3): qty 2

## 2016-08-03 MED ORDER — POTASSIUM CHLORIDE CRYS ER 20 MEQ PO TBCR
40.0000 meq | EXTENDED_RELEASE_TABLET | Freq: Once | ORAL | Status: AC
  Administered 2016-08-03: 40 meq via ORAL
  Filled 2016-08-03: qty 2

## 2016-08-03 MED ORDER — TIOTROPIUM BROMIDE MONOHYDRATE 18 MCG IN CAPS
ORAL_CAPSULE | RESPIRATORY_TRACT | Status: AC
  Filled 2016-08-03: qty 5

## 2016-08-03 MED ORDER — ONDANSETRON HCL 4 MG/2ML IJ SOLN: 4.0000 mg | Freq: Four times a day (QID) | INTRAMUSCULAR | Status: DC | PRN

## 2016-08-03 MED ORDER — PHENYTOIN SODIUM EXTENDED 100 MG PO CAPS
300.0000 mg | ORAL_CAPSULE | ORAL | Status: DC
  Administered 2016-08-05 – 2016-08-08 (×3): 300 mg via ORAL
  Filled 2016-08-03 (×3): qty 3

## 2016-08-03 MED ORDER — POTASSIUM CHLORIDE CRYS ER 20 MEQ PO TBCR
20.0000 meq | EXTENDED_RELEASE_TABLET | Freq: Two times a day (BID) | ORAL | Status: DC
  Administered 2016-08-03 – 2016-08-04 (×2): 20 meq via ORAL
  Filled 2016-08-03 (×2): qty 1

## 2016-08-03 MED ORDER — CALCITRIOL 0.25 MCG PO CAPS
0.5000 ug | ORAL_CAPSULE | Freq: Two times a day (BID) | ORAL | Status: DC
  Administered 2016-08-03 – 2016-08-08 (×10): 0.5 ug via ORAL
  Filled 2016-08-03 (×10): qty 2

## 2016-08-03 MED ORDER — ISOSORB DINITRATE-HYDRALAZINE 20-37.5 MG PO TABS
ORAL_TABLET | ORAL | Status: AC
  Filled 2016-08-03: qty 2

## 2016-08-03 MED ORDER — ONDANSETRON HCL 4 MG PO TABS: 4.0000 mg | ORAL_TABLET | Freq: Four times a day (QID) | ORAL | Status: DC | PRN

## 2016-08-03 MED ORDER — SODIUM CHLORIDE 0.9% FLUSH
3.0000 mL | INTRAVENOUS | Status: DC | PRN
  Administered 2016-08-04: 3 mL via INTRAVENOUS
  Filled 2016-08-03: qty 3

## 2016-08-03 MED ORDER — DESMOPRESSIN ACETATE 0.2 MG PO TABS
0.2000 mg | ORAL_TABLET | Freq: Two times a day (BID) | ORAL | Status: DC
  Administered 2016-08-03 – 2016-08-08 (×10): 0.2 mg via ORAL
  Filled 2016-08-03 (×16): qty 1

## 2016-08-03 MED ORDER — TORSEMIDE 20 MG PO TABS
40.0000 mg | ORAL_TABLET | Freq: Two times a day (BID) | ORAL | Status: DC
  Administered 2016-08-03: 40 mg via ORAL
  Filled 2016-08-03: qty 2

## 2016-08-03 MED ORDER — LEVOTHYROXINE SODIUM 75 MCG PO TABS
175.0000 ug | ORAL_TABLET | Freq: Every day | ORAL | Status: DC
  Administered 2016-08-04 – 2016-08-08 (×5): 175 ug via ORAL
  Filled 2016-08-03 (×5): qty 1

## 2016-08-03 MED ORDER — POTASSIUM CHLORIDE 10 MEQ/100ML IV SOLN
10.0000 meq | Freq: Once | INTRAVENOUS | Status: AC
  Administered 2016-08-03: 10 meq via INTRAVENOUS
  Filled 2016-08-03: qty 100

## 2016-08-03 MED ORDER — FLUTICASONE PROPIONATE 50 MCG/ACT NA SUSP
2.0000 | Freq: Every day | NASAL | Status: DC
  Administered 2016-08-03 – 2016-08-08 (×6): 2 via NASAL
  Filled 2016-08-03 (×3): qty 16

## 2016-08-03 MED ORDER — TIOTROPIUM BROMIDE MONOHYDRATE 18 MCG IN CAPS
18.0000 ug | ORAL_CAPSULE | Freq: Every day | RESPIRATORY_TRACT | Status: DC
  Administered 2016-08-04 – 2016-08-08 (×5): 18 ug via RESPIRATORY_TRACT
  Filled 2016-08-03 (×2): qty 5

## 2016-08-03 MED ORDER — SODIUM CHLORIDE 0.9 % IV SOLN
250.0000 mL | INTRAVENOUS | Status: DC | PRN
Start: 2016-08-03 — End: 2016-08-08

## 2016-08-03 MED ORDER — ALBUTEROL SULFATE (2.5 MG/3ML) 0.083% IN NEBU: 3.0000 mL | INHALATION_SOLUTION | Freq: Four times a day (QID) | RESPIRATORY_TRACT | Status: DC | PRN

## 2016-08-03 MED ORDER — PANTOPRAZOLE SODIUM 40 MG PO TBEC
40.0000 mg | DELAYED_RELEASE_TABLET | Freq: Two times a day (BID) | ORAL | Status: DC
  Administered 2016-08-03 – 2016-08-08 (×10): 40 mg via ORAL
  Filled 2016-08-03 (×10): qty 1

## 2016-08-03 MED ORDER — ASPIRIN 81 MG PO CHEW
324.0000 mg | CHEWABLE_TABLET | Freq: Once | ORAL | Status: AC
  Administered 2016-08-03: 324 mg via ORAL

## 2016-08-03 MED ORDER — ORAL CARE MOUTH RINSE
15.0000 mL | Freq: Two times a day (BID) | OROMUCOSAL | Status: DC
  Administered 2016-08-03 – 2016-08-08 (×9): 15 mL via OROMUCOSAL

## 2016-08-03 MED ORDER — CLONIDINE HCL 0.2 MG PO TABS
0.2000 mg | ORAL_TABLET | Freq: Three times a day (TID) | ORAL | Status: DC
  Administered 2016-08-03 – 2016-08-05 (×5): 0.2 mg via ORAL
  Filled 2016-08-03 (×8): qty 1

## 2016-08-03 MED ORDER — HYDROCODONE-ACETAMINOPHEN 5-325 MG PO TABS
1.0000 | ORAL_TABLET | ORAL | Status: DC | PRN
  Administered 2016-08-03 (×2): 1 via ORAL
  Administered 2016-08-04 – 2016-08-05 (×6): 2 via ORAL
  Administered 2016-08-06: 1 via ORAL
  Administered 2016-08-06 – 2016-08-07 (×3): 2 via ORAL
  Administered 2016-08-07: 1 via ORAL
  Administered 2016-08-08: 2 via ORAL
  Filled 2016-08-03 (×2): qty 1
  Filled 2016-08-03 (×5): qty 2
  Filled 2016-08-03: qty 1
  Filled 2016-08-03: qty 2
  Filled 2016-08-03: qty 1
  Filled 2016-08-03 (×4): qty 2

## 2016-08-03 MED ORDER — HYDROCODONE-ACETAMINOPHEN 5-325 MG PO TABS
1.0000 | ORAL_TABLET | Freq: Once | ORAL | Status: AC
  Administered 2016-08-03: 1 via ORAL
  Filled 2016-08-03: qty 1

## 2016-08-03 MED ORDER — ACETAMINOPHEN 650 MG RE SUPP: 650.0000 mg | Freq: Four times a day (QID) | RECTAL | Status: DC | PRN

## 2016-08-03 MED ORDER — SIMVASTATIN 20 MG PO TABS
20.0000 mg | ORAL_TABLET | Freq: Every day | ORAL | Status: DC
  Administered 2016-08-03 – 2016-08-08 (×6): 20 mg via ORAL
  Filled 2016-08-03 (×6): qty 1

## 2016-08-03 MED ORDER — SODIUM CHLORIDE 0.9% FLUSH
3.0000 mL | Freq: Two times a day (BID) | INTRAVENOUS | Status: DC
  Administered 2016-08-04 – 2016-08-08 (×6): 3 mL via INTRAVENOUS

## 2016-08-03 MED ORDER — ACETAMINOPHEN 325 MG PO TABS: 650.0000 mg | ORAL_TABLET | Freq: Four times a day (QID) | ORAL | Status: DC | PRN

## 2016-08-03 MED ORDER — HEPARIN SODIUM (PORCINE) 5000 UNIT/ML IJ SOLN
5000.0000 [IU] | Freq: Three times a day (TID) | INTRAMUSCULAR | Status: DC
  Administered 2016-08-03 – 2016-08-08 (×14): 5000 [IU] via SUBCUTANEOUS
  Filled 2016-08-03 (×14): qty 1

## 2016-08-03 MED ORDER — GUAIFENESIN ER 600 MG PO TB12
1200.0000 mg | ORAL_TABLET | Freq: Two times a day (BID) | ORAL | Status: DC
  Administered 2016-08-03 – 2016-08-08 (×10): 1200 mg via ORAL
  Filled 2016-08-03 (×10): qty 2

## 2016-08-03 MED ORDER — DESMOPRESSIN ACETATE 0.2 MG PO TABS
ORAL_TABLET | ORAL | Status: AC
  Filled 2016-08-03: qty 1

## 2016-08-03 MED ORDER — ISOSORB DINITRATE-HYDRALAZINE 20-37.5 MG PO TABS
1.0000 | ORAL_TABLET | Freq: Three times a day (TID) | ORAL | Status: DC
  Administered 2016-08-03 – 2016-08-08 (×10): 1 via ORAL
  Filled 2016-08-03 (×24): qty 1

## 2016-08-03 MED ORDER — SODIUM CHLORIDE 0.9% FLUSH
3.0000 mL | Freq: Two times a day (BID) | INTRAVENOUS | Status: DC
  Administered 2016-08-03 – 2016-08-06 (×3): 3 mL via INTRAVENOUS

## 2016-08-03 MED ORDER — POLYETHYLENE GLYCOL 3350 17 GM/SCOOP PO POWD
17.0000 g | Freq: Every day | ORAL | Status: DC
  Filled 2016-08-03: qty 255

## 2016-08-03 MED ORDER — PHENYTOIN SODIUM EXTENDED 100 MG PO CAPS
300.0000 mg | ORAL_CAPSULE | Freq: Once | ORAL | Status: AC
Start: 2016-08-03 — End: 2016-08-03
  Administered 2016-08-03: 300 mg via ORAL
  Filled 2016-08-03: qty 3

## 2016-08-03 MED ORDER — AMLODIPINE BESYLATE 5 MG PO TABS
5.0000 mg | ORAL_TABLET | Freq: Every day | ORAL | Status: DC
  Administered 2016-08-03 – 2016-08-08 (×4): 5 mg via ORAL
  Filled 2016-08-03 (×6): qty 1

## 2016-08-03 MED ORDER — ASPIRIN 81 MG PO CHEW
CHEWABLE_TABLET | ORAL | Status: AC
  Filled 2016-08-03: qty 4

## 2016-08-03 MED ORDER — MOMETASONE FURO-FORMOTEROL FUM 200-5 MCG/ACT IN AERO
INHALATION_SPRAY | RESPIRATORY_TRACT | Status: AC
  Filled 2016-08-03: qty 8.8

## 2016-08-03 MED ORDER — MOMETASONE FURO-FORMOTEROL FUM 200-5 MCG/ACT IN AERO
2.0000 | INHALATION_SPRAY | Freq: Two times a day (BID) | RESPIRATORY_TRACT | Status: DC
  Administered 2016-08-03 – 2016-08-08 (×10): 2 via RESPIRATORY_TRACT
  Filled 2016-08-03: qty 8.8

## 2016-08-03 NOTE — ED Notes (Signed)
Dr Oleta Mouse notified of elevated troponin

## 2016-08-03 NOTE — ED Notes (Signed)
Called STEMI doctor for consult via [facility]

## 2016-08-03 NOTE — ED Triage Notes (Signed)
Reports fall yesterday after standing.  Unknown LOC.  Pain in left upper rib.
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
## 2016-08-03 NOTE — ED Notes (Signed)
Patient transported to CT 
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
## 2016-08-04 ENCOUNTER — Observation Stay (HOSPITAL_COMMUNITY): Payer: Medicare HMO

## 2016-08-04 ENCOUNTER — Ambulatory Visit: Payer: Medicare HMO | Admitting: Cardiology

## 2016-08-04 DIAGNOSIS — I2510 Atherosclerotic heart disease of native coronary artery without angina pectoris: Secondary | ICD-10-CM

## 2016-08-04 DIAGNOSIS — R55 Syncope and collapse: Secondary | ICD-10-CM | POA: Diagnosis not present

## 2016-08-04 LAB — COMPREHENSIVE METABOLIC PANEL
ALBUMIN: 3.5 g/dL (ref 3.5–5.0)
ALK PHOS: 34 U/L — AB (ref 38–126)
ALT: 17 U/L (ref 17–63)
ANION GAP: 10 (ref 5–15)
AST: 16 U/L (ref 15–41)
BUN: 94 mg/dL — ABNORMAL HIGH (ref 6–20)
CHLORIDE: 97 mmol/L — AB (ref 101–111)
CO2: 29 mmol/L (ref 22–32)
Calcium: 8.8 mg/dL — ABNORMAL LOW (ref 8.9–10.3)
Creatinine, Ser: 2.9 mg/dL — ABNORMAL HIGH (ref 0.61–1.24)
GFR calc Af Amer: 24 mL/min — ABNORMAL LOW (ref >60)
GFR calc non Af Amer: 20 mL/min — ABNORMAL LOW (ref >60)
GLUCOSE: 95 mg/dL (ref 65–99)
POTASSIUM: 3.2 mmol/L — AB (ref 3.5–5.1)
SODIUM: 136 mmol/L (ref 135–145)
Total Bilirubin: 1 mg/dL (ref 0.3–1.2)
Total Protein: 7.2 g/dL (ref 6.5–8.1)

## 2016-08-04 LAB — BASIC METABOLIC PANEL
ANION GAP: 9 (ref 5–15)
BUN: 97 mg/dL — ABNORMAL HIGH (ref 6–20)
CALCIUM: 8.2 mg/dL — AB (ref 8.9–10.3)
CO2: 26 mmol/L (ref 22–32)
Chloride: 97 mmol/L — ABNORMAL LOW (ref 101–111)
Creatinine, Ser: 3.37 mg/dL — ABNORMAL HIGH (ref 0.61–1.24)
GFR, EST AFRICAN AMERICAN: 20 mL/min — AB (ref >60)
GFR, EST NON AFRICAN AMERICAN: 17 mL/min — AB (ref >60)
GLUCOSE: 158 mg/dL — AB (ref 65–99)
POTASSIUM: 3.4 mmol/L — AB (ref 3.5–5.1)
Sodium: 132 mmol/L — ABNORMAL LOW (ref 135–145)

## 2016-08-04 LAB — CBC
HEMATOCRIT: 37.6 % — AB (ref 39.0–52.0)
HEMOGLOBIN: 12.5 g/dL — AB (ref 13.0–17.0)
MCH: 30.4 pg (ref 26.0–34.0)
MCHC: 33.2 g/dL (ref 30.0–36.0)
MCV: 91.5 fL (ref 78.0–100.0)
Platelets: 136 10*3/uL — ABNORMAL LOW (ref 150–400)
RBC: 4.11 MIL/uL — ABNORMAL LOW (ref 4.22–5.81)
RDW: 14.8 % (ref 11.5–15.5)
WBC: 16.1 10*3/uL — ABNORMAL HIGH (ref 4.0–10.5)

## 2016-08-04 LAB — TROPONIN I
TROPONIN I: 0.07 ng/mL — AB (ref <0.03)
TROPONIN I: 0.07 ng/mL — AB (ref <0.03)

## 2016-08-04 LAB — MAGNESIUM: Magnesium: 1.9 mg/dL (ref 1.7–2.4)

## 2016-08-04 MED ORDER — POLYETHYLENE GLYCOL 3350 17 G PO PACK
17.0000 g | PACK | Freq: Every day | ORAL | Status: DC
  Administered 2016-08-04 – 2016-08-08 (×5): 17 g via ORAL
  Filled 2016-08-04 (×5): qty 1

## 2016-08-04 MED ORDER — SODIUM CHLORIDE 0.9 % IV SOLN
INTRAVENOUS | Status: DC
  Administered 2016-08-04 – 2016-08-08 (×6): via INTRAVENOUS

## 2016-08-04 NOTE — Progress Notes (Signed)
Pt resting in bed with eyes close, states pain is a lot better. No signs or symptoms of acute distress. No notable change in swelling to right knee or left ankle. Will continue to monitor.

## 2016-08-04 NOTE — Evaluation (Signed)
Physical Therapy Evaluation Patient Details Name: [name] [name] MRN: [id number] DOB: 1946-03-04 Today's Date: 08/04/2016   History of Present Illness  70 y.o. male with medical history significant of COPD, CKD stage IV, RBBB, CHF, hyperlipidemia, hypertension, obesity, and hypothyroidism. He presents to the ED with complaints of constant, gradually worsening left shoulder and rib cage pain that onset yesterday s/p ground level fall. Pt had a prolonged episode coughing yesterday that was followed by an episode of syncope. After pt began coughing, the next thing he remembers is waking up on the floor. It was unclear as to how long he was unconscious. He reports falling on his left side with resulting injury to his head, shoulder, and ribs. Pt did not seek medical attention at that time. He woke up this morning and was unable to get out of bed due to persistent pain, so he decided to come to the ED. He was recently discharged from the hospital after being treated for COPD exacerbation. He reports breathing has done fairely well since his discharge. He has not had any recurrence of wheezing. He denies chest pressure. After his syncope episode, he had an episode of vomiting.  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    Clinical Impression  Pt received in bed, brother initially preset, but left, and pt was marginally agreeable to PT evaluation.  Pt states that he was modified independent with ambulation - using a cane or a RW, and he was able to perform ADL's independently.  Today during PT evaluation, he required Max A for all functional mobility.  He was only able to get into seated position on the EOB.  Attempted standing several times, however, pt  c/o increased pain in L ankle, which does not allow him to place any weight through the L ankle.  He also c/o pain in the R knee.  Pt assisted back into supine with Max A.  Pt will need SNF as he demonstrates decreased functional mobility, decreased strength, and increased pain.      Follow Up Recommendations SNF    Equipment Recommendations  None recommended by PT    Recommendations for Other Services       Precautions / Restrictions Precautions Precautions: Fall      Mobility  Bed Mobility Overal bed mobility: Needs Assistance Bed Mobility: Supine to Sit;Sit to Supine     Supine to sit: Max assist;HOB elevated (Increased time due to slowed processing, and pt wanting to pull on therapist.  Multiple vc's to push on the bed and rails.  ) Sit to supine: Max assist (Assist to lift LE's into the bed.)   General bed mobility comments: Max A for repositioning in the bed.  Attempted to have pt assist with supine scoot  with LE's, but pt not following commands to bend knees.    Transfers Overall transfer level:  (multiple attempts at sit<>stand, even with bed elevated, and pt not able to lift buttocks up off the EOB.  Pt expressed that he has so much pain in his L ankle that he can't bear weight through it.  )                  Ambulation/Gait                Stairs  Wheelchair Mobility    Modified Rankin (Stroke Patients Only)       Balance Overall balance assessment: Needs assistance Sitting-balance support: Bilateral upper extremity supported Sitting balance-Leahy Scale: Fair                                       Pertinent Vitals/Pain Pain Assessment: 0-10 Pain Score: 8  Pain Location: R knee  Pain Descriptors / Indicators: Throbbing Pain Intervention(s): Limited activity within patient's tolerance;Monitored during session    Home Living   Living Arrangements: Alone Available Help at Discharge:  (dtr and neighbors - help  with housework and groceries. ) Type of Home: House Home Access: Stairs to enter   [employer] of Steps: 2 in the front and 3-4 in the back.  Home Layout: One level Home Equipment: Walker - 2 wheels;Cane - single point;Bedside commode (CPap)      Prior Function     Gait / Transfers Assistance Needed: Pt states he was using a RW and the cane just prior to coming into the hospital.    ADL's / Homemaking Assistance Needed: Pt states he was independent for dressing and bathing.  Dtr, & neighbor assist with housework and groceries.   Comments: Pt states that he sleeps in a recliner at home.     Hand Dominance   Dominant Hand: Right    Extremity/Trunk Assessment   Upper Extremity Assessment: Generalized weakness           Lower Extremity Assessment: Generalized weakness         Communication   Communication: No difficulties  Cognition Arousal/Alertness: Awake/alert Behavior During Therapy: WFL for tasks assessed/performed Overall Cognitive Status: Within Functional Limits for tasks assessed                      General Comments      Exercises        Assessment/Plan    PT Assessment Patient needs continued PT services  PT Diagnosis Difficulty walking;Abnormality of gait;Generalized weakness;Acute pain   PT Problem List Decreased strength;Decreased range of motion;Decreased activity tolerance;Decreased balance;Decreased mobility;Decreased knowledge of precautions;Decreased safety awareness;Obesity;Decreased skin integrity;Decreased coordination  PT Treatment Interventions DME instruction;Gait training;Stair training;Functional mobility training;Therapeutic activities;Therapeutic exercise;Balance training;Patient/family education   PT Goals (Current goals can be found in the Care Plan section) Acute Rehab PT Goals Patient Stated Goal: Pt wants to get stronger.  PT Goal Formulation: With patient Time For Goal Achievement: 08/11/16    Frequency  Min 3X/week   Barriers to discharge Decreased caregiver support;Inaccessible home environment      Co-evaluation               End of Session Equipment Utilized During Treatment: Gait belt Activity Tolerance: Patient limited by fatigue;Patient limited by pain Patient left: in bed;with call bell/phone within reach Nurse Communication: Mobility status (Re: pain in L ankle and request for pain medication. )    Functional Assessment Tool Used: Valinda "6-clicks"  Functional Limitation: Mobility: Walking and moving around Mobility: Walking and Moving Around Current Status [phone]): At least 80 percent but less than 100 percent impaired, limited or restricted Mobility: Walking and Moving Around Goal Status [phone]): At least 60 percent but less than 80 percent impaired, limited or restricted    Time: 1121-1204 PT Time Calculation (min) (ACUTE ONLY): 43 min   Charges:   PT Evaluation $PT Eval Moderate Complexity:  1 Procedure PT Treatments $Therapeutic Activity: 23-37 mins   PT G Codes:   PT G-Codes **NOT FOR INPATIENT CLASS** Functional Assessment Tool Used: [employer] "6-clicks"  Functional Limitation: Mobility: Walking and moving around Mobility: Walking and Moving Around Current Status [phone]): At least 80 percent but less than 100 percent impaired, limited or restricted Mobility: Walking and Moving Around Goal Status [phone]): At least 60 percent but less than 80 percent impaired, limited or restricted    Beth Tausha Milhoan, PT, DPT X: [phone]

## 2016-08-04 NOTE — Clinical Social Work Note (Signed)
Clinical Social Work Assessment  Patient Details  Name: [name] [name] MRN: [id number] Date of Birth: 08-19-1946  Date of referral:  08/04/16               Reason for consult:  Discharge Planning                Permission sought to share information with:    Permission granted to share information::     Name::        Agency::     Relationship::     Contact Information:     Housing/Transportation Living arrangements for the past 2 months:  Single Family Home Source of Information:  Patient Patient Interpreter Needed:  None Criminal Activity/Legal Involvement Pertinent to Current Situation/Hospitalization:  No - Comment as needed Significant Relationships:  Adult Children, Siblings Lives with:  Self Do you feel safe going back to the place where you live?  Yes Need for family participation in patient care:  Yes (Comment)  Care giving concerns:  Pt is not at baseline and lives alone.    Social Worker assessment / plan:  CSW met with pt at bedside. Pt alert and oriented and reports he lives alone. He has good family support from a daughter, brother, and sister. Pt states that they all help him out at home. Pt complaining of knee pain after a fall at home. He said before that, he was doing okay at home on his own. Pt uses a walker. CSW discussed PT evaluation recommending SNF. Pt said he has been to [facility] in the past and owes over $6000. [name] not interested in returning to SNF at this time and plans to go back home. CSW asked pt about assistance at home and he said his brother has stayed with him before when needed and he can stay with him now. [name] not very interested in home health either, but will consider. CM notified. CSW will sign off, but can be reconsulted if needed.   Employment status:  Retired Nurse, adult PT Recommendations:  [place] / Referral to community resources:  [place]  Patient/Family's Response  to care:  Pt refuses SNF and states that he already has a Engineer, civil (consulting) at [facility].   Patient/Family's Understanding of and Emotional Response to Diagnosis, Current Treatment, and Prognosis:  Pt indicates he is aware of diagnosis and treatment plan. He feels he can manage at home with family assist.   Emotional Assessment Appearance:  Appears stated age Attitude/Demeanor/Rapport:  Other (Cooperative) Affect (typically observed):  Accepting Orientation:  Oriented to Self, Oriented to Place, Oriented to  Time, Oriented to Situation Alcohol / Substance use:  Not Applicable Psych involvement (Current and /or in the community):  No (Comment)  Discharge Needs  Concerns to be addressed:  Discharge Planning Concerns Readmission within the last 30 days:  Yes Current discharge risk:  Physical Impairment Barriers to Discharge:  Continued Medical Work up   Salome Arnt, [place] 08/04/2016, 12:34 PM [phone]

## 2016-08-04 NOTE — Care Management Note (Signed)
Case Management Note  Patient Details  Name: [name] [name] MRN: [id number] Date of Birth: 07-11-1946  Subjective/Objective:                  Pt observation after having syncopal episode at home. Pt lives alone and has strong family support. PT has recommended SNF, pt refuses and states he may be interested in [facility] but would like to wait until day of DC to decide. Pt has used [facility] in the past and would like to use them again if needed.   Action/Plan: Will cont to follow.   Expected Discharge Date:    08/06/2016              Expected Discharge Plan:  [place]  In-House Referral:  Clinical Social Work  Discharge planning Services  CM Consult  Post Acute Care Choice:  Home Health Choice offered to:  Patient  DME Arranged:    DME Agency:     HH Arranged:    [place] Agency:     Status of Service:  In process, will continue to follow  If discussed at Long Length of Stay Meetings, dates discussed:    Additional Comments:  Sherald Barge, RN 08/04/2016, 2:28 PM

## 2016-08-04 NOTE — Care Management Obs Status (Signed)
[place] NOTIFICATION   Patient Details  Name: [name] [name] MRN: [id number] Date of Birth: 05/09/1946   Medicare Observation Status Notification Given:  Yes    Sherald Barge, RN 08/04/2016, 2:31 PM

## 2016-08-04 NOTE — Care Management Important Message (Deleted)
Important Message  Patient Details  Name: [name] [name] MRN: [id number] Date of Birth: 1946-04-07   Medicare Important Message Given:  Yes    Sherald Barge, RN 08/04/2016, 2:28 PM
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
## 2016-08-04 NOTE — Progress Notes (Signed)
Subjective: Patient was admitted yesterday after he passed out. Patient was at home and was talking when he passed out and hit the floor. His troponin is slightly elevated. His BUN and Cr. Is elevated. His edema has subsided.  Objective: Vital signs in last 24 hours: Temp:  [97.8 F (36.6 C)-98.7 F (37.1 C)] 98.4 F (36.9 C) (08/28 0720) Pulse Rate:  [77-99] 87 (08/28 0720) Resp:  [18-22] 20 (08/28 0720) BP: (124-156)/(44-58) 124/56 (08/28 0720) SpO2:  [96 %-100 %] 96 % (08/28 0745) Weight:  [107.4 kg (236 lb 11.2 oz)-111.1 kg (245 lb)] 107.4 kg (236 lb 11.2 oz) (08/27 1458) Weight change:  Last BM Date: 08/03/16  Intake/Output from previous day: 08/27 0701 - 08/28 0700 In: 120 [P.O.:120] Out: 1375 [Urine:1375]  PHYSICAL EXAM General appearance: alert and no distress Resp: diminished breath sounds bilaterally and rhonchi bilaterally Cardio: S1, S2 normal GI: soft, non-tender; bowel sounds normal; no masses,  no organomegaly Extremities: extremities normal, atraumatic, no cyanosis or edema  Lab Results:  Results for orders placed or performed during the hospital encounter of 08/03/16 (from the past 48 hour(s))  CBC with Differential     Status: Abnormal   Collection Time: 08/03/16 11:35 AM  Result Value Ref Range   WBC 12.7 (H) 4.0 - 10.5 K/uL   RBC 4.23 4.22 - 5.81 MIL/uL   Hemoglobin 13.0 13.0 - 17.0 g/dL   HCT 38.3 (L) 39.0 - 52.0 %   MCV 90.5 78.0 - 100.0 fL   MCH 30.7 26.0 - 34.0 pg   MCHC 33.9 30.0 - 36.0 g/dL   RDW 14.7 11.5 - 15.5 %   Platelets 133 (L) 150 - 400 K/uL   Neutrophils Relative % 78 %   Neutro Abs 9.8 (H) 1.7 - 7.7 K/uL   Lymphocytes Relative 9 %   Lymphs Abs 1.2 0.7 - 4.0 K/uL   Monocytes Relative 8 %   Monocytes Absolute 1.1 (H) 0.1 - 1.0 K/uL   Eosinophils Relative 5 %   Eosinophils Absolute 0.6 0.0 - 0.7 K/uL   Basophils Relative 0 %   Basophils Absolute 0.1 0.0 - 0.1 K/uL  Comprehensive metabolic panel     Status: Abnormal   Collection  Time: 08/03/16 11:35 AM  Result Value Ref Range   Sodium 137 135 - 145 mmol/L   Potassium 2.9 (L) 3.5 - 5.1 mmol/L   Chloride 97 (L) 101 - 111 mmol/L   CO2 28 22 - 32 mmol/L   Glucose, Bld 109 (H) 65 - 99 mg/dL   BUN 91 (H) 6 - 20 mg/dL   Creatinine, Ser 2.52 (H) 0.61 - 1.24 mg/dL   Calcium 9.4 8.9 - 10.3 mg/dL   Total Protein 8.1 6.5 - 8.1 g/dL   Albumin 4.1 3.5 - 5.0 g/dL   AST 18 15 - 41 U/L   ALT 21 17 - 63 U/L   Alkaline Phosphatase 38 38 - 126 U/L   Total Bilirubin 1.0 0.3 - 1.2 mg/dL   GFR calc non Af Amer 24 (L) >60 mL/min   GFR calc Af Amer 28 (L) >60 mL/min    Comment: (NOTE) The eGFR has been calculated using the CKD EPI equation. This calculation has not been validated in all clinical situations. eGFR's persistently <60 mL/min signify possible Chronic Kidney Disease.    Anion gap 12 5 - 15  Protime-INR     Status: Abnormal   Collection Time: 08/03/16 11:35 AM  Result Value Ref Range   Prothrombin Time  15.3 (H) 11.4 - 15.2 seconds   INR 1.20   APTT     Status: Abnormal   Collection Time: 08/03/16 11:35 AM  Result Value Ref Range   aPTT 40 (H) 24 - 36 seconds    Comment:        IF BASELINE aPTT IS ELEVATED, SUGGEST PATIENT RISK ASSESSMENT BE USED TO DETERMINE APPROPRIATE ANTICOAGULANT THERAPY.   I-Stat Troponin, ED (not at [facility])     Status: Abnormal   Collection Time: 08/03/16 11:42 AM  Result Value Ref Range   Troponin i, poc 0.09 (HH) 0.00 - 0.08 ng/mL   Comment NOTIFIED PHYSICIAN    Comment 3            Comment: Due to the release kinetics of cTnI, a negative result within the first hours of the onset of symptoms does not rule out myocardial infarction with certainty. If myocardial infarction is still suspected, repeat the test at appropriate intervals.   Troponin I     Status: Abnormal   Collection Time: 08/03/16  3:32 PM  Result Value Ref Range   Troponin I 0.07 (HH) <0.03 ng/mL    Comment: CRITICAL VALUE NOTED.  VALUE IS CONSISTENT WITH  PREVIOUSLY REPORTED AND CALLED VALUE.  Troponin I     Status: Abnormal   Collection Time: 08/04/16 12:52 AM  Result Value Ref Range   Troponin I 0.07 (HH) <0.03 ng/mL    Comment: CRITICAL VALUE NOTED.  VALUE IS CONSISTENT WITH PREVIOUSLY REPORTED AND CALLED VALUE.  Comprehensive metabolic panel     Status: Abnormal   Collection Time: 08/04/16  6:09 AM  Result Value Ref Range   Sodium 136 135 - 145 mmol/L   Potassium 3.2 (L) 3.5 - 5.1 mmol/L   Chloride 97 (L) 101 - 111 mmol/L   CO2 29 22 - 32 mmol/L   Glucose, Bld 95 65 - 99 mg/dL   BUN 94 (H) 6 - 20 mg/dL   Creatinine, Ser 2.90 (H) 0.61 - 1.24 mg/dL   Calcium 8.8 (L) 8.9 - 10.3 mg/dL   Total Protein 7.2 6.5 - 8.1 g/dL   Albumin 3.5 3.5 - 5.0 g/dL   AST 16 15 - 41 U/L   ALT 17 17 - 63 U/L   Alkaline Phosphatase 34 (L) 38 - 126 U/L   Total Bilirubin 1.0 0.3 - 1.2 mg/dL   GFR calc non Af Amer 20 (L) >60 mL/min   GFR calc Af Amer 24 (L) >60 mL/min    Comment: (NOTE) The eGFR has been calculated using the CKD EPI equation. This calculation has not been validated in all clinical situations. eGFR's persistently <60 mL/min signify possible Chronic Kidney Disease.    Anion gap 10 5 - 15  CBC     Status: Abnormal   Collection Time: 08/04/16  6:09 AM  Result Value Ref Range   WBC 16.1 (H) 4.0 - 10.5 K/uL   RBC 4.11 (L) 4.22 - 5.81 MIL/uL   Hemoglobin 12.5 (L) 13.0 - 17.0 g/dL   HCT 37.6 (L) 39.0 - 52.0 %   MCV 91.5 78.0 - 100.0 fL   MCH 30.4 26.0 - 34.0 pg   MCHC 33.2 30.0 - 36.0 g/dL   RDW 14.8 11.5 - 15.5 %   Platelets 136 (L) 150 - 400 K/uL  Magnesium     Status: None   Collection Time: 08/04/16  6:09 AM  Result Value Ref Range   Magnesium 1.9 1.7 - 2.4 mg/dL  Troponin I  Status: Abnormal   Collection Time: 08/04/16  6:09 AM  Result Value Ref Range   Troponin I 0.07 (HH) <0.03 ng/mL    Comment: CRITICAL VALUE NOTED.  VALUE IS CONSISTENT WITH PREVIOUSLY REPORTED AND CALLED VALUE.    ABGS No results for input(s):  PHART, PO2ART, TCO2, HCO3 in the last 72 hours.  Invalid input(s): PCO2 CULTURES No results found for this or any previous visit (from the past 240 hour(s)). Studies/Results: Dg Ribs Unilateral W/chest Left  Result Date: 08/03/2016 CLINICAL DATA:  Left anterior mid to lower rib pain EXAM: LEFT RIBS AND CHEST - 3+ VIEW COMPARISON:  07/20/2016 FINDINGS: Heart size appears enlarged. No pleural effusion or edema. No airspace opacities identified. No rib fractures identified. IMPRESSION: 1. No acute cardiopulmonary abnormalities. 2. No displaced rib fractures identified. Electronically Signed   By: Kerby Moors M.D.   On: 08/03/2016 11:42   Ct Head Wo Contrast  Result Date: 08/03/2016 CLINICAL DATA:  . Fall yesterday with striking head. Loss of consciousness with nausea and vomiting. EXAM: CT HEAD WITHOUT CONTRAST CT CERVICAL SPINE WITHOUT CONTRAST TECHNIQUE: Multidetector CT imaging of the head and cervical spine was performed following the standard protocol without intravenous contrast. Multiplanar CT image reconstructions of the cervical spine were also generated. COMPARISON:  Head CT 12/08/2014 FINDINGS: CT HEAD FINDINGS No intracranial hemorrhage. No parenchymal contusion. No midline shift or mass effect. Basilar cisterns are patent. No skull base fracture. No fluid in the paranasal sinuses or mastoid air cells. Orbits are normal. Mild cortical atrophy and proportional ventricular dilatation. Encephalomalacia in LEFT frontal lobe beneath the cranioplasty site . Prior microplate fixation of the LEFT frontal bone with cranioplasty. Paranasal sinuses and mastoid air cells are clear. Orbits are clear. CT CERVICAL SPINE FINDINGS Reversal normal cervical lordosis. There are multiple small round lucent lesions within the cervical vertebral bodies. There is increased sclerosis of vertebral bodies. There is multiple levels of joint space narrowing and endplate spurring. No acute loss vertebral body height and  disc height. Normal facet articulation. Normal craniocervical junction. IMPRESSION: 1. No acute intracranial findings. 2. Atrophy and microvascular disease. 3. LEFT frontal cranioplasty and encephalomalacia. 4. No cervical spine fracture. 5. Diffuse Sclerosis and multiple small lucency is cervical spine likely represent osteoporosis. Electronically Signed   By: Suzy Bouchard M.D.   On: 08/03/2016 12:27   Ct Cervical Spine Wo Contrast  Result Date: 08/03/2016 CLINICAL DATA:  . Fall yesterday with striking head. Loss of consciousness with nausea and vomiting. EXAM: CT HEAD WITHOUT CONTRAST CT CERVICAL SPINE WITHOUT CONTRAST TECHNIQUE: Multidetector CT imaging of the head and cervical spine was performed following the standard protocol without intravenous contrast. Multiplanar CT image reconstructions of the cervical spine were also generated. COMPARISON:  Head CT 12/08/2014 FINDINGS: CT HEAD FINDINGS No intracranial hemorrhage. No parenchymal contusion. No midline shift or mass effect. Basilar cisterns are patent. No skull base fracture. No fluid in the paranasal sinuses or mastoid air cells. Orbits are normal. Mild cortical atrophy and proportional ventricular dilatation. Encephalomalacia in LEFT frontal lobe beneath the cranioplasty site . Prior microplate fixation of the LEFT frontal bone with cranioplasty. Paranasal sinuses and mastoid air cells are clear. Orbits are clear. CT CERVICAL SPINE FINDINGS Reversal normal cervical lordosis. There are multiple small round lucent lesions within the cervical vertebral bodies. There is increased sclerosis of vertebral bodies. There is multiple levels of joint space narrowing and endplate spurring. No acute loss vertebral body height and disc height. Normal facet articulation. Normal craniocervical  junction. IMPRESSION: 1. No acute intracranial findings. 2. Atrophy and microvascular disease. 3. LEFT frontal cranioplasty and encephalomalacia. 4. No cervical spine  fracture. 5. Diffuse Sclerosis and multiple small lucency is cervical spine likely represent osteoporosis. Electronically Signed   By: Suzy Bouchard M.D.   On: 08/03/2016 12:27    Medications: I have reviewed the patient's current medications.  Assesment:  Active Problems:   Hypertension   Hypothyroid   Hyperlipidemia   Seizure disorder (HCC)   Chronic combined systolic and diastolic CHF (congestive heart failure) (HCC)   Elevated troponin   CKD (chronic kidney disease) stage 4, GFR 15-29 ml/min (HCC)   Syncope   COPD (chronic obstructive pulmonary disease) ([place])    Plan:  Medications reviewed  Will do Cardiology consult Will rehydrate slowly Will hold diuretic BMP daily   LOS: 0 days   [name] [name] 08/04/2016, 8:06 AM

## 2016-08-05 ENCOUNTER — Observation Stay (HOSPITAL_BASED_OUTPATIENT_CLINIC_OR_DEPARTMENT_OTHER): Payer: Medicare HMO

## 2016-08-05 DIAGNOSIS — R55 Syncope and collapse: Secondary | ICD-10-CM | POA: Diagnosis not present

## 2016-08-05 LAB — ECHOCARDIOGRAM COMPLETE
AO mean calculated velocity dopler: 198 cm/s
AOVTI: 45.6 cm
AV Area VTI: 1.26 cm2
AV Peak grad: 33 mmHg
AV VEL mean LVOT/AV: 0.4
AV pk vel: 287 cm/s
AV vel: 1.37
AVA: 1.37 cm2
AVAREAMEANV: 1.25 cm2
AVG: 18 mmHg
AVLVOTPG: 5 mmHg
AVPHT: 288 ms
Ao pk vel: 0.4 m/s
CHL CUP MV DEC (S): 148
CHL CUP RV SYS PRESS: 20 mmHg
CHL CUP TV REG PEAK VELOCITY: 205 cm/s
EERAT: 26.1
EWDT: 148 ms
FS: 31 % (ref 28–44)
Height: 73 in
IVS/LV PW RATIO, ED: 0.95
LA ID, A-P, ES: 31 mm
LA diam end sys: 31 mm
LA vol A4C: 26 ml
LA vol: 37.6 mL
LV PW d: 17 mm — AB (ref 0.6–1.1)
LV TDI E'LATERAL: 4.79
LV TDI E'MEDIAL: 7.29
LVEEAVG: 26.1
LVEEMED: 26.1
LVELAT: 4.79 cm/s
LVOT VTI: 19.9 cm
LVOT area: 3.14 cm2
LVOT diameter: 20 mm
LVOT peak vel: 115 cm/s
LVOTSV: 62 mL
LVOTVTI: 0.44 cm
MV Peak grad: 6 mmHg
MV pk E vel: 125 m/s
TAPSE: 14.6 mm
TR max vel: 205 cm/s
Weight: 3854.4 oz

## 2016-08-05 LAB — BASIC METABOLIC PANEL
ANION GAP: 11 (ref 5–15)
BUN: 95 mg/dL — AB (ref 6–20)
CHLORIDE: 100 mmol/L — AB (ref 101–111)
CO2: 23 mmol/L (ref 22–32)
Calcium: 8.5 mg/dL — ABNORMAL LOW (ref 8.9–10.3)
Creatinine, Ser: 3.59 mg/dL — ABNORMAL HIGH (ref 0.61–1.24)
GFR calc Af Amer: 18 mL/min — ABNORMAL LOW (ref >60)
GFR calc non Af Amer: 16 mL/min — ABNORMAL LOW (ref >60)
GLUCOSE: 104 mg/dL — AB (ref 65–99)
POTASSIUM: 4 mmol/L (ref 3.5–5.1)
Sodium: 134 mmol/L — ABNORMAL LOW (ref 135–145)

## 2016-08-05 LAB — GLUCOSE, CAPILLARY: Glucose-Capillary: 106 mg/dL — ABNORMAL HIGH (ref 65–99)

## 2016-08-05 MED ORDER — CARVEDILOL 3.125 MG PO TABS
3.1250 mg | ORAL_TABLET | Freq: Two times a day (BID) | ORAL | Status: DC
Start: 2016-08-05 — End: 2016-08-08
  Administered 2016-08-06 – 2016-08-08 (×4): 3.125 mg via ORAL
  Filled 2016-08-05 (×7): qty 1

## 2016-08-05 MED ORDER — GI COCKTAIL ~~LOC~~: 30.0000 mL | Freq: Two times a day (BID) | ORAL | Status: DC | PRN

## 2016-08-05 NOTE — Progress Notes (Signed)
Physical Therapy Treatment Patient Details Name: CARRIE READUS MRN: [id number] DOB: 07-May-1946 Today's Date: 08/05/2016    History of Present Illness 70 y.o. male with medical history significant of COPD, CKD stage IV, RBBB, CHF, hyperlipidemia, hypertension, obesity, and hypothyroidism. He presents to the ED with complaints of constant, gradually worsening left shoulder and rib cage pain that onset yesterday s/p ground level fall. Pt had a prolonged episode coughing yesterday that was followed by an episode of syncope. After pt began coughing, the next thing he remembers is waking up on the floor. It was unclear as to how long he was unconscious. He reports falling on his left side with resulting injury to his head, shoulder, and ribs. Pt did not seek medical attention at that time. He woke up this morning and was unable to get out of bed due to persistent pain, so he decided to come to the ED. He was recently discharged from the hospital after being treated for COPD exacerbation. He reports breathing has done fairely well since his discharge. He has not had any recurrence of wheezing. He denies chest pressure. After his syncope episode, he had an episode of vomiting.  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    PT Comments    Pt received in bed, asleep.  Pt only able to participate in bed level exercises due to the amount of pain he is having.  Pain has changed location to his abdomen & R groin now.  RN administered pain medication during tx.  Pt is very guarded with all mobility, and requires increased time to perform any mobility task.  Pt would still benefit from SNF due to poor strength, increased pain, and decreased functional mobility.     Follow Up Recommendations  SNF     Equipment Recommendations  None recommended by PT    Recommendations for Other Services       Precautions / Restrictions Precautions Precautions: Fall Restrictions Weight Bearing Restrictions: No    Mobility  Bed Mobility               General bed mobility comments: Supine scoot - total A +2 with bed in trendelenburg  Transfers                    Ambulation/Gait                 Stairs            Wheelchair Mobility    Modified Rankin (Stroke Patients Only)       Balance                                    Cognition Arousal/Alertness: Awake/alert Behavior During Therapy: WFL for tasks assessed/performed Overall Cognitive Status: Within Functional Limits for tasks assessed                      Exercises General Exercises - Upper Extremity Shoulder Flexion: AROM;Both;10 reps General Exercises - Lower Extremity Ankle Circles/Pumps: AROM;Both;10 reps;Supine (individually - unable to dual task to complete both at the same time. ) Heel Slides: AAROM;Both;10 reps;Limitations Heel Slides Limitations: pain in R groin, which limits AROM of R knee in  this position.  Hip ABduction/ADduction: AAROM;Both;10 reps;Supine;Limitations Hip Abduction/Adduction Limitations: hand positioned under pt's heel to assist with decreased frictionon the sheets.   Shoulder Exercises Neck Lateral Flexion - Left: PROM;Supine;5 reps;Limitations Neck Lateral Flexion - Left Limitations: Pt limited due to guarding Other Exercises Other Exercises: shoulder shrugs x 10 reps each    General Comments        Pertinent Vitals/Pain Pain Assessment: Faces Faces Pain Scale: Hurts whole lot Pain Location: Abdominal pain Pain Intervention(s): RN gave pain meds during session;Limited activity within patient's tolerance;Repositioned;Monitored during session    Home Living                      Prior  Function            PT Goals (current goals can now be found in the care plan section) Acute Rehab PT Goals Patient Stated Goal: Pt wants to get stronger.  PT Goal Formulation: With patient Time For Goal Achievement: 08/11/16 Progress towards PT goals: Not progressing toward goals - comment    Frequency  Min 3X/week    PT Plan Current plan remains appropriate    Co-evaluation             End of Session   Activity Tolerance: Patient limited by pain Patient left: in bed;with call bell/phone within reach;with bed alarm set     Time: 1423-1456 PT Time Calculation (min) (ACUTE ONLY): 33 min  Charges:  $Therapeutic Exercise: 8-22 mins $Therapeutic Activity: 8-22 mins                    G Codes:      Beth Jaxsin Bottomley, PT, DPT X: [phone]

## 2016-08-05 NOTE — Progress Notes (Signed)
Subjective:  Denies SOB, c/o epigastric pain  Objective:  Vital Signs in the last 24 hours: Temp:  [98.2 F (36.8 C)-99.1 F (37.3 C)] 98.4 F (36.9 C) (08/29 0700) Pulse Rate:  [95-100] 100 (08/29 0700) Resp:  [18] 18 (08/28 1300) BP: (119-142)/(36-52) 137/39 (08/29 0908) SpO2:  [96 %-98 %] 97 % (08/29 0841) Weight:  [240 lb 14.4 oz (109.3 kg)] 240 lb 14.4 oz (109.3 kg) (08/29 0700)  Intake/Output from previous day:  Intake/Output Summary (Last 24 hours) at 08/05/16 0944 Last data filed at 08/05/16 0924  Gross per 24 hour  Intake           1837.5 ml  Output              600 ml  Net           1237.5 ml    Physical Exam: General appearance: alert, cooperative, no distress and morbidly obese Lungs: decreased breath sounds (poor effort) Heart: regular rate and rhythm and increased rate Abdomen: distended, tympanic Neurologic: Grossly normal   Rate: 120  Rhythm: sinus tachycardia and RBBB  Lab Results:  Recent Labs  08/03/16 1135 08/04/16 0609  WBC 12.7* 16.1*  HGB 13.0 12.5*  PLT 133* 136*    Recent Labs  08/04/16 1328 08/05/16 0620  NA 132* 134*  K 3.4* 4.0  CL 97* 100*  CO2 26 23  GLUCOSE 158* 104*  BUN 97* 95*  CREATININE 3.37* 3.59*    Recent Labs  08/04/16 0052 08/04/16 0609  TROPONINI 0.07* 0.07*    Recent Labs  08/03/16 1135  INR 1.20    Scheduled Meds: . amLODipine  5 mg Oral Daily  . calcitRIOL  0.5 mcg Oral BID  . cloNIDine  0.2 mg Oral TID  . desmopressin  0.2 mg Oral BID  . fluticasone  2 spray Each Nare Daily  . guaiFENesin  1,200 mg Oral BID  . heparin  5,000 Units Subcutaneous Q8H  . isosorbide-hydrALAZINE  1 tablet Oral TID  . levothyroxine  175 mcg Oral QAC breakfast  . mouth rinse  15 mL Mouth Rinse BID  . mometasone-formoterol  2 puff Inhalation BID  . pantoprazole  40 mg Oral BID  . phenytoin  200 mg Oral Once per day on Mon Thu  . phenytoin  300 mg Oral Once per day on Sun Tue Wed Fri Sat  . polyethylene  glycol  17 g Oral Daily  . simvastatin  20 mg Oral Daily  . sodium chloride flush  3 mL Intravenous Q12H  . sodium chloride flush  3 mL Intravenous Q12H  . tiotropium  18 mcg Inhalation Daily   Continuous Infusions: . sodium chloride 75 mL/hr at 08/05/16 0102   PRN Meds:.sodium chloride, albuterol, HYDROcodone-acetaminophen, ondansetron **OR** ondansetron (ZOFRAN) IV, sodium chloride flush   Imaging: Dg Ribs Unilateral W/chest Left  Result Date: 08/03/2016 CLINICAL DATA:  Left anterior mid to lower rib pain EXAM: LEFT RIBS AND CHEST - 3+ VIEW COMPARISON:  07/20/2016 FINDINGS: Heart size appears enlarged. No pleural effusion or edema. No airspace opacities identified. No rib fractures identified. IMPRESSION: 1. No acute cardiopulmonary abnormalities. 2. No displaced rib fractures identified. Electronically Signed   By: Kerby Moors M.D.   On: 08/03/2016 11:42   Dg Knee 1-2 Views Right  Result Date: 08/04/2016 CLINICAL DATA:  Pain EXAM: RIGHT KNEE - 1-2 VIEW COMPARISON:  None. FINDINGS: Frontal and lateral views were obtained. There is no acute fracture or dislocation. There is  a moderate joint effusion. There is slight narrowing medially and in the patellofemoral joint region. Lucency along the posterior surface of the patella is likely due to chondromalacia patella. 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 No acute fracture or dislocation. Electronically Signed   By: Lowella Grip III M.D.   On: 08/04/2016 14:59   Dg Ankle 2 Views Left  Result Date: 08/04/2016 CLINICAL DATA:  Inpatient.  Left ankle pain.  No reported injury. EXAM: LEFT ANKLE - 2 VIEW COMPARISON:  None. FINDINGS: Diffuse left ankle soft tissue swelling. No fracture, subluxation, appreciable arthropathy or suspicious focal osseous lesion. Tiny Achilles left calcaneal spur. Tiny radiodensities are seen in the  superficial left heel soft tissues. IMPRESSION: 1. Diffuse left ankle soft tissue swelling. No fracture or malalignment. 2. Tiny Achilles left calcaneal spur. 3. Tiny radiodensities in the superficial left heel soft tissues, cannot exclude tiny radiopaque foreign bodies within/just below the skin. Electronically Signed   By: Ilona Sorrel M.D.   On: 08/04/2016 14:59   Ct Head Wo Contrast  Result Date: 08/03/2016 CLINICAL DATA:  . Fall yesterday with striking head. Loss of consciousness with nausea and vomiting. EXAM: CT HEAD WITHOUT CONTRAST CT CERVICAL SPINE WITHOUT CONTRAST TECHNIQUE: Multidetector CT imaging of the head and cervical spine was performed following the standard protocol without intravenous contrast. Multiplanar CT image reconstructions of the cervical spine were also generated. COMPARISON:  Head CT 12/08/2014 FINDINGS: CT HEAD FINDINGS No intracranial hemorrhage. No parenchymal contusion. No midline shift or mass effect. Basilar cisterns are patent. No skull base fracture. No fluid in the paranasal sinuses or mastoid air cells. Orbits are normal. Mild cortical atrophy and proportional ventricular dilatation. Encephalomalacia in LEFT frontal lobe beneath the cranioplasty site . Prior microplate fixation of the LEFT frontal bone with cranioplasty. Paranasal sinuses and mastoid air cells are clear. Orbits are clear. CT CERVICAL SPINE FINDINGS Reversal normal cervical lordosis. There are multiple small round lucent lesions within the cervical vertebral bodies. There is increased sclerosis of vertebral bodies. There is multiple levels of joint space narrowing and endplate spurring. No acute loss vertebral body height and disc height. Normal facet articulation. Normal craniocervical junction. IMPRESSION: 1. No acute intracranial findings. 2. Atrophy and microvascular disease. 3. LEFT frontal cranioplasty and encephalomalacia. 4. No cervical spine fracture. 5. Diffuse Sclerosis and multiple small  lucency is cervical spine likely represent osteoporosis. Electronically Signed   By: Suzy Bouchard M.D.   On: 08/03/2016 12:27   Ct Cervical Spine Wo Contrast  Result Date: 08/03/2016 CLINICAL DATA:  . Fall yesterday with striking head. Loss of consciousness with nausea and vomiting. EXAM: CT HEAD WITHOUT CONTRAST CT CERVICAL SPINE WITHOUT CONTRAST TECHNIQUE: Multidetector CT imaging of the head and cervical spine was performed following the standard protocol without intravenous contrast. Multiplanar CT image reconstructions of the cervical spine were also generated. COMPARISON:  Head CT 12/08/2014 FINDINGS: CT HEAD FINDINGS No intracranial hemorrhage. No parenchymal contusion. No midline shift or mass effect. Basilar cisterns are patent. No skull base fracture. No fluid in the paranasal sinuses or mastoid air cells. Orbits are normal. Mild cortical atrophy and proportional ventricular dilatation. Encephalomalacia in LEFT frontal lobe beneath the cranioplasty site . Prior microplate fixation of the LEFT frontal bone with cranioplasty. Paranasal sinuses and mastoid air cells are clear. Orbits are clear. CT CERVICAL SPINE FINDINGS Reversal normal cervical lordosis. There are multiple small round lucent lesions within the cervical  vertebral bodies. There is increased sclerosis of vertebral bodies. There is multiple levels of joint space narrowing and endplate spurring. No acute loss vertebral body height and disc height. Normal facet articulation. Normal craniocervical junction. IMPRESSION: 1. No acute intracranial findings. 2. Atrophy and microvascular disease. 3. LEFT frontal cranioplasty and encephalomalacia. 4. No cervical spine fracture. 5. Diffuse Sclerosis and multiple small lucency is cervical spine likely represent osteoporosis. Electronically Signed   By: Suzy Bouchard M.D.   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 He was admitted through the ED 08/03/16 after a syncopal spell. The pt has recently had his diuretics adjusted. He says he was sitting, stood up and coughed, found himself on the floor. In the ED his K+ was 2.9, BUN 91 (baseline 70-85). His Troponin was slightly elevated but in reviewing prior labs this appears to be chronic.  Principal Problem:   Syncope and collapse Active Problems:   Elevated troponin   Hypertension   Moderate aortic regurgitation   Diabetes (HCC)   Chronic combined systolic and diastolic CHF    CKD (chronic kidney disease) stage 4, GFR 15-29 ml/min (HCC)   COPD (chronic obstructive pulmonary disease) (HCC)   Obese   Sleep apnea   Hypothyroid   Hyperlipidemia   Panhypopituitarism ([place])   Seizure disorder [facility])   CAD-minor CAD Jan 2016   PLAN: BUN and SCr remain elevated despite IV hydration. GFR 18- pt             nearing dialysis.               Abdomin is distended and pt is uncomfortable- ? Ileus- GI                        Cocktail              HR 120- will check 12 lead EKG-add low dose Coreg  Kerin Ransom PA-C 08/05/2016, 9:44 AM [phone]  See my progress note. No arrythmia on telemetry no evidence of cardiogenic Syncope significant renal issues and arthritis with limited mobility No further cardiac w/u indicated Will sign off  [employer]

## 2016-08-05 NOTE — Progress Notes (Signed)
Received verbal order from Dr. Legrand Rams  to hold pt's Catapres, coreg and Bidil for B/P 129/40

## 2016-08-05 NOTE — Progress Notes (Signed)
Patient ID: [name] [name], male   DOB: 31-May-1946, 70 y.o.   MRN: [id number]    Subjective:  Denies SSCP, palpitations or Dyspnea Aches all over doesn't want to get OOB  Objective:  Vitals:   08/05/16 0700 08/05/16 0839 08/05/16 0841 08/05/16 0908  BP: (!) 119/39   (!) 137/39  Pulse: 100     Resp:      Temp: 98.4 F (36.9 C)     TempSrc: Oral     SpO2: 97% 96% 97%   Weight: 240 lb 14.4 oz (109.3 kg)     Height:        Intake/Output from previous day:  Intake/Output Summary (Last 24 hours) at 08/05/16 0941 Last data filed at 08/05/16 0924  Gross per 24 hour  Intake           1837.5 ml  Output              600 ml  Net           1237.5 ml    Physical Exam: .Affect appropriate Obese black male  HEENT: normal Neck supple with no adenopathy JVP normal no bruits no thyromegaly Lungs clear with no wheezing and good diaphragmatic motion Heart:  S1/S2 no murmur, no rub, gallop or click PMI normal Abdomen: benighn, BS positve, no tenderness, no AAA no bruit.  No HSM or HJR Distal pulses intact with no bruits No edema Neuro non-focal Skin warm and dry No muscular weakness   Lab Results: Basic Metabolic Panel:  Recent Labs  08/04/16 0609 08/04/16 1328 08/05/16 0620  NA 136 132* 134*  K 3.2* 3.4* 4.0  CL 97* 97* 100*  CO2 29 26 23   GLUCOSE 95 158* 104*  BUN 94* 97* 95*  CREATININE 2.90* 3.37* 3.59*  CALCIUM 8.8* 8.2* 8.5*  MG 1.9  --   --    Liver Function Tests:  Recent Labs  08/03/16 1135 08/04/16 0609  AST 18 16  ALT 21 17  ALKPHOS 38 34*  BILITOT 1.0 1.0  PROT 8.1 7.2  ALBUMIN 4.1 3.5   No results for input(s): LIPASE, AMYLASE in the last 72 hours. CBC:  Recent Labs  08/03/16 1135 08/04/16 0609  WBC 12.7* 16.1*  NEUTROABS 9.8*  --   HGB 13.0 12.5*  HCT 38.3* 37.6*  MCV 90.5 91.5  PLT 133* 136*   Cardiac Enzymes:  Recent Labs  08/03/16 1532 08/04/16 0052 08/04/16 0609  TROPONINI 0.07* 0.07* 0.07*    Imaging: Dg Ribs  Unilateral W/chest Left  Result Date: 08/03/2016 CLINICAL DATA:  Left anterior mid to lower rib pain EXAM: LEFT RIBS AND CHEST - 3+ VIEW COMPARISON:  07/20/2016 FINDINGS: Heart size appears enlarged. No pleural effusion or edema. No airspace opacities identified. No rib fractures identified. IMPRESSION: 1. No acute cardiopulmonary abnormalities. 2. No displaced rib fractures identified. Electronically Signed   By: Kerby Moors M.D.   On: 08/03/2016 11:42   Dg Knee 1-2 Views Right  Result Date: 08/04/2016 CLINICAL DATA:  Pain EXAM: RIGHT KNEE - 1-2 VIEW COMPARISON:  None. FINDINGS: Frontal and lateral views were obtained. There is no acute fracture or dislocation. There is a moderate joint effusion. There is slight narrowing medially and in the patellofemoral joint region. Lucency along the posterior surface of the patella is likely due to chondromalacia patella. 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 No acute fracture or dislocation. Electronically Signed  By: Lowella Grip III M.D.   On: 08/04/2016 14:59   Dg Ankle 2 Views Left  Result Date: 08/04/2016 CLINICAL DATA:  Inpatient.  Left ankle pain.  No reported injury. EXAM: LEFT ANKLE - 2 VIEW COMPARISON:  None. FINDINGS: Diffuse left ankle soft tissue swelling. No fracture, subluxation, appreciable arthropathy or suspicious focal osseous lesion. Tiny Achilles left calcaneal spur. Tiny radiodensities are seen in the superficial left heel soft tissues. IMPRESSION: 1. Diffuse left ankle soft tissue swelling. No fracture or malalignment. 2. Tiny Achilles left calcaneal spur. 3. Tiny radiodensities in the superficial left heel soft tissues, cannot exclude tiny radiopaque foreign bodies within/just below the skin. Electronically Signed   By: Ilona Sorrel M.D.   On: 08/04/2016 14:59   Ct Head Wo Contrast  Result Date: 08/03/2016 CLINICAL DATA:   . Fall yesterday with striking head. Loss of consciousness with nausea and vomiting. EXAM: CT HEAD WITHOUT CONTRAST CT CERVICAL SPINE WITHOUT CONTRAST TECHNIQUE: Multidetector CT imaging of the head and cervical spine was performed following the standard protocol without intravenous contrast. Multiplanar CT image reconstructions of the cervical spine were also generated. COMPARISON:  Head CT 12/08/2014 FINDINGS: CT HEAD FINDINGS No intracranial hemorrhage. No parenchymal contusion. No midline shift or mass effect. Basilar cisterns are patent. No skull base fracture. No fluid in the paranasal sinuses or mastoid air cells. Orbits are normal. Mild cortical atrophy and proportional ventricular dilatation. Encephalomalacia in LEFT frontal lobe beneath the cranioplasty site . Prior microplate fixation of the LEFT frontal bone with cranioplasty. Paranasal sinuses and mastoid air cells are clear. Orbits are clear. CT CERVICAL SPINE FINDINGS Reversal normal cervical lordosis. There are multiple small round lucent lesions within the cervical vertebral bodies. There is increased sclerosis of vertebral bodies. There is multiple levels of joint space narrowing and endplate spurring. No acute loss vertebral body height and disc height. Normal facet articulation. Normal craniocervical junction. IMPRESSION: 1. No acute intracranial findings. 2. Atrophy and microvascular disease. 3. LEFT frontal cranioplasty and encephalomalacia. 4. No cervical spine fracture. 5. Diffuse Sclerosis and multiple small lucency is cervical spine likely represent osteoporosis. Electronically Signed   By: Suzy Bouchard M.D.   On: 08/03/2016 12:27   Ct Cervical Spine Wo Contrast  Result Date: 08/03/2016 CLINICAL DATA:  . Fall yesterday with striking head. Loss of consciousness with nausea and vomiting. EXAM: CT HEAD WITHOUT CONTRAST CT CERVICAL SPINE WITHOUT CONTRAST TECHNIQUE: Multidetector CT imaging of the head and cervical spine was performed  following the standard protocol without intravenous contrast. Multiplanar CT image reconstructions of the cervical spine were also generated. COMPARISON:  Head CT 12/08/2014 FINDINGS: CT HEAD FINDINGS No intracranial hemorrhage. No parenchymal contusion. No midline shift or mass effect. Basilar cisterns are patent. No skull base fracture. No fluid in the paranasal sinuses or mastoid air cells. Orbits are normal. Mild cortical atrophy and proportional ventricular dilatation. Encephalomalacia in LEFT frontal lobe beneath the cranioplasty site . Prior microplate fixation of the LEFT frontal bone with cranioplasty. Paranasal sinuses and mastoid air cells are clear. Orbits are clear. CT CERVICAL SPINE FINDINGS Reversal normal cervical lordosis. There are multiple small round lucent lesions within the cervical vertebral bodies. There is increased sclerosis of vertebral bodies. There is multiple levels of joint space narrowing and endplate spurring. No acute loss vertebral body height and disc height. Normal facet articulation. Normal craniocervical junction. IMPRESSION: 1. No acute intracranial findings. 2. Atrophy and microvascular disease. 3. LEFT frontal cranioplasty and encephalomalacia. 4. No cervical spine fracture.  5. Diffuse Sclerosis and multiple small lucency is cervical spine likely represent osteoporosis. Electronically Signed   By: Suzy Bouchard M.D.   On: 08/03/2016 12:27    Cardiac Studies:  ECG:  SR RBBB LAD no acute changes    Telemetry:  NSR 08/05/2016   Echo:   Medications:   . amLODipine  5 mg Oral Daily  . calcitRIOL  0.5 mcg Oral BID  . cloNIDine  0.2 mg Oral TID  . desmopressin  0.2 mg Oral BID  . fluticasone  2 spray Each Nare Daily  . guaiFENesin  1,200 mg Oral BID  . heparin  5,000 Units Subcutaneous Q8H  . isosorbide-hydrALAZINE  1 tablet Oral TID  . levothyroxine  175 mcg Oral QAC breakfast  . mouth rinse  15 mL Mouth Rinse BID  . mometasone-formoterol  2 puff Inhalation  BID  . pantoprazole  40 mg Oral BID  . phenytoin  200 mg Oral Once per day on Mon Thu  . phenytoin  300 mg Oral Once per day on Sun Tue Wed Fri Sat  . polyethylene glycol  17 g Oral Daily  . simvastatin  20 mg Oral Daily  . sodium chloride flush  3 mL Intravenous Q12H  . sodium chloride flush  3 mL Intravenous Q12H  . tiotropium  18 mcg Inhalation Daily     . sodium chloride 75 mL/hr at 08/05/16 0102    Assessment/Plan:  Pt seen and examined  Difficult historian   70 yo with history mild CAD by cath in Jan 2016  LVEF [id number]  Systolic / diastolic CHF  Stage 4 CKD  Admtted with syncopal spell on 8/27  Occurred when pt went from sitting to standing  Dizzy  No CP  Note recent change in diuretics Currently no dizziness  COmplains of R knee pain  Tele with SR  Trop with flat elevation at 0.07  BUN/CR 92/2.9  Previous 72/2.88-3.35  No need for further cardiac w/u in hospital try to get OOB and ambulate  Jenkins Rouge 08/05/2016, 9:41 AM

## 2016-08-05 NOTE — Progress Notes (Signed)
*  PRELIMINARY RESULTS* Echocardiogram 2D Echocardiogram has been performed.  [name] [name] 08/05/2016, 2:09 PM

## 2016-08-05 NOTE — Progress Notes (Signed)
Subjective: Patient complains of left knee swelling and pain. X-ray showed soft tissue swelling and degenerative changes but no fracture or dislocation. His diuretics is on hold and patient is slowly rehydrated. No major change in his renal function.  Objective: Vital signs in last 24 hours: Temp:  [98.2 F (36.8 C)-99.1 F (37.3 C)] 98.4 F (36.9 C) (08/29 0700) Pulse Rate:  [95-106] 100 (08/29 0700) Resp:  [18] 18 (08/28 1300) BP: (119-143)/(34-52) 119/39 (08/29 0700) SpO2:  [97 %-98 %] 97 % (08/29 0700) Weight:  [109.3 kg (240 lb 14.4 oz)] 109.3 kg (240 lb 14.4 oz) (08/29 0700) Weight change: -1.86 kg (-4 lb 1.6 oz) Last BM Date: 08/03/16  Intake/Output from previous day: 08/28 0701 - 08/29 0700 In: 1837.5 [P.O.:240; I.V.:1597.5] Out: 600 [Urine:600]  PHYSICAL EXAM General appearance: alert and no distress Resp: diminished breath sounds bilaterally and rhonchi bilaterally Cardio: S1, S2 normal GI: soft, non-tender; bowel sounds normal; no masses,  no organomegaly Extremities: tenderness, swelling decreased range of motion of  right knee  Lab Results:  Results for orders placed or performed during the hospital encounter of 08/03/16 (from the past 48 hour(s))  CBC with Differential     Status: Abnormal   Collection Time: 08/03/16 11:35 AM  Result Value Ref Range   WBC 12.7 (H) 4.0 - 10.5 K/uL   RBC 4.23 4.22 - 5.81 MIL/uL   Hemoglobin 13.0 13.0 - 17.0 g/dL   HCT 38.3 (L) 39.0 - 52.0 %   MCV 90.5 78.0 - 100.0 fL   MCH 30.7 26.0 - 34.0 pg   MCHC 33.9 30.0 - 36.0 g/dL   RDW 14.7 11.5 - 15.5 %   Platelets 133 (L) 150 - 400 K/uL   Neutrophils Relative % 78 %   Neutro Abs 9.8 (H) 1.7 - 7.7 K/uL   Lymphocytes Relative 9 %   Lymphs Abs 1.2 0.7 - 4.0 K/uL   Monocytes Relative 8 %   Monocytes Absolute 1.1 (H) 0.1 - 1.0 K/uL   Eosinophils Relative 5 %   Eosinophils Absolute 0.6 0.0 - 0.7 K/uL   Basophils Relative 0 %   Basophils Absolute 0.1 0.0 - 0.1 K/uL  Comprehensive  metabolic panel     Status: Abnormal   Collection Time: 08/03/16 11:35 AM  Result Value Ref Range   Sodium 137 135 - 145 mmol/L   Potassium 2.9 (L) 3.5 - 5.1 mmol/L   Chloride 97 (L) 101 - 111 mmol/L   CO2 28 22 - 32 mmol/L   Glucose, Bld 109 (H) 65 - 99 mg/dL   BUN 91 (H) 6 - 20 mg/dL   Creatinine, Ser 2.52 (H) 0.61 - 1.24 mg/dL   Calcium 9.4 8.9 - 10.3 mg/dL   Total Protein 8.1 6.5 - 8.1 g/dL   Albumin 4.1 3.5 - 5.0 g/dL   AST 18 15 - 41 U/L   ALT 21 17 - 63 U/L   Alkaline Phosphatase 38 38 - 126 U/L   Total Bilirubin 1.0 0.3 - 1.2 mg/dL   GFR calc non Af Amer 24 (L) >60 mL/min   GFR calc Af Amer 28 (L) >60 mL/min    Comment: (NOTE) The eGFR has been calculated using the CKD EPI equation. This calculation has not been validated in all clinical situations. eGFR's persistently <60 mL/min signify possible Chronic Kidney Disease.    Anion gap 12 5 - 15  Protime-INR     Status: Abnormal   Collection Time: 08/03/16 11:35 AM  Result Value  Ref Range   Prothrombin Time 15.3 (H) 11.4 - 15.2 seconds   INR 1.20   APTT     Status: Abnormal   Collection Time: 08/03/16 11:35 AM  Result Value Ref Range   aPTT 40 (H) 24 - 36 seconds    Comment:        IF BASELINE aPTT IS ELEVATED, SUGGEST PATIENT RISK ASSESSMENT BE USED TO DETERMINE APPROPRIATE ANTICOAGULANT THERAPY.   I-Stat Troponin, ED (not at [facility])     Status: Abnormal   Collection Time: 08/03/16 11:42 AM  Result Value Ref Range   Troponin i, poc 0.09 (HH) 0.00 - 0.08 ng/mL   Comment NOTIFIED PHYSICIAN    Comment 3            Comment: Due to the release kinetics of cTnI, a negative result within the first hours of the onset of symptoms does not rule out myocardial infarction with certainty. If myocardial infarction is still suspected, repeat the test at appropriate intervals.   Troponin I     Status: Abnormal   Collection Time: 08/03/16  3:32 PM  Result Value Ref Range   Troponin I 0.07 (HH) <0.03 ng/mL    Comment:  CRITICAL VALUE NOTED.  VALUE IS CONSISTENT WITH PREVIOUSLY REPORTED AND CALLED VALUE.  Troponin I     Status: Abnormal   Collection Time: 08/04/16 12:52 AM  Result Value Ref Range   Troponin I 0.07 (HH) <0.03 ng/mL    Comment: CRITICAL VALUE NOTED.  VALUE IS CONSISTENT WITH PREVIOUSLY REPORTED AND CALLED VALUE.  Comprehensive metabolic panel     Status: Abnormal   Collection Time: 08/04/16  6:09 AM  Result Value Ref Range   Sodium 136 135 - 145 mmol/L   Potassium 3.2 (L) 3.5 - 5.1 mmol/L   Chloride 97 (L) 101 - 111 mmol/L   CO2 29 22 - 32 mmol/L   Glucose, Bld 95 65 - 99 mg/dL   BUN 94 (H) 6 - 20 mg/dL   Creatinine, Ser 2.90 (H) 0.61 - 1.24 mg/dL   Calcium 8.8 (L) 8.9 - 10.3 mg/dL   Total Protein 7.2 6.5 - 8.1 g/dL   Albumin 3.5 3.5 - 5.0 g/dL   AST 16 15 - 41 U/L   ALT 17 17 - 63 U/L   Alkaline Phosphatase 34 (L) 38 - 126 U/L   Total Bilirubin 1.0 0.3 - 1.2 mg/dL   GFR calc non Af Amer 20 (L) >60 mL/min   GFR calc Af Amer 24 (L) >60 mL/min    Comment: (NOTE) The eGFR has been calculated using the CKD EPI equation. This calculation has not been validated in all clinical situations. eGFR's persistently <60 mL/min signify possible Chronic Kidney Disease.    Anion gap 10 5 - 15  CBC     Status: Abnormal   Collection Time: 08/04/16  6:09 AM  Result Value Ref Range   WBC 16.1 (H) 4.0 - 10.5 K/uL   RBC 4.11 (L) 4.22 - 5.81 MIL/uL   Hemoglobin 12.5 (L) 13.0 - 17.0 g/dL   HCT 37.6 (L) 39.0 - 52.0 %   MCV 91.5 78.0 - 100.0 fL   MCH 30.4 26.0 - 34.0 pg   MCHC 33.2 30.0 - 36.0 g/dL   RDW 14.8 11.5 - 15.5 %   Platelets 136 (L) 150 - 400 K/uL  Magnesium     Status: None   Collection Time: 08/04/16  6:09 AM  Result Value Ref Range   Magnesium 1.9 1.7 -  2.4 mg/dL  Troponin I     Status: Abnormal   Collection Time: 08/04/16  6:09 AM  Result Value Ref Range   Troponin I 0.07 (HH) <0.03 ng/mL    Comment: CRITICAL VALUE NOTED.  VALUE IS CONSISTENT WITH PREVIOUSLY REPORTED AND CALLED  VALUE.  Basic metabolic panel     Status: Abnormal   Collection Time: 08/04/16  1:28 PM  Result Value Ref Range   Sodium 132 (L) 135 - 145 mmol/L   Potassium 3.4 (L) 3.5 - 5.1 mmol/L   Chloride 97 (L) 101 - 111 mmol/L   CO2 26 22 - 32 mmol/L   Glucose, Bld 158 (H) 65 - 99 mg/dL   BUN 97 (H) 6 - 20 mg/dL   Creatinine, Ser 3.37 (H) 0.61 - 1.24 mg/dL   Calcium 8.2 (L) 8.9 - 10.3 mg/dL   GFR calc non Af Amer 17 (L) >60 mL/min   GFR calc Af Amer 20 (L) >60 mL/min    Comment: (NOTE) The eGFR has been calculated using the CKD EPI equation. This calculation has not been validated in all clinical situations. eGFR's persistently <60 mL/min signify possible Chronic Kidney Disease.    Anion gap 9 5 - 15  Basic metabolic panel     Status: Abnormal   Collection Time: 08/05/16  6:20 AM  Result Value Ref Range   Sodium 134 (L) 135 - 145 mmol/L   Potassium 4.0 3.5 - 5.1 mmol/L   Chloride 100 (L) 101 - 111 mmol/L   CO2 23 22 - 32 mmol/L   Glucose, Bld 104 (H) 65 - 99 mg/dL   BUN 95 (H) 6 - 20 mg/dL   Creatinine, Ser 3.59 (H) 0.61 - 1.24 mg/dL   Calcium 8.5 (L) 8.9 - 10.3 mg/dL   GFR calc non Af Amer 16 (L) >60 mL/min   GFR calc Af Amer 18 (L) >60 mL/min    Comment: (NOTE) The eGFR has been calculated using the CKD EPI equation. This calculation has not been validated in all clinical situations. eGFR's persistently <60 mL/min signify possible Chronic Kidney Disease.    Anion gap 11 5 - 15  Glucose, capillary     Status: Abnormal   Collection Time: 08/05/16  7:22 AM  Result Value Ref Range   Glucose-Capillary 106 (H) 65 - 99 mg/dL    ABGS No results for input(s): PHART, PO2ART, TCO2, HCO3 in the last 72 hours.  Invalid input(s): PCO2 CULTURES No results found for this or any previous visit (from the past 240 hour(s)). Studies/Results: Dg Ribs Unilateral W/chest Left  Result Date: 08/03/2016 CLINICAL DATA:  Left anterior mid to lower rib pain EXAM: LEFT RIBS AND CHEST - 3+ VIEW  COMPARISON:  07/20/2016 FINDINGS: Heart size appears enlarged. No pleural effusion or edema. No airspace opacities identified. No rib fractures identified. IMPRESSION: 1. No acute cardiopulmonary abnormalities. 2. No displaced rib fractures identified. Electronically Signed   By: Kerby Moors M.D.   On: 08/03/2016 11:42   Dg Knee 1-2 Views Right  Result Date: 08/04/2016 CLINICAL DATA:  Pain EXAM: RIGHT KNEE - 1-2 VIEW COMPARISON:  None. FINDINGS: Frontal and lateral views were obtained. There is no acute fracture or dislocation. There is a moderate joint effusion. There is slight narrowing medially and in the patellofemoral joint region. Lucency along the posterior surface of the patella is likely due to chondromalacia patella. 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  No acute fracture or dislocation. Electronically Signed   By: Lowella Grip III M.D.   On: 08/04/2016 14:59   Dg Ankle 2 Views Left  Result Date: 08/04/2016 CLINICAL DATA:  Inpatient.  Left ankle pain.  No reported injury. EXAM: LEFT ANKLE - 2 VIEW COMPARISON:  None. FINDINGS: Diffuse left ankle soft tissue swelling. No fracture, subluxation, appreciable arthropathy or suspicious focal osseous lesion. Tiny Achilles left calcaneal spur. Tiny radiodensities are seen in the superficial left heel soft tissues. IMPRESSION: 1. Diffuse left ankle soft tissue swelling. No fracture or malalignment. 2. Tiny Achilles left calcaneal spur. 3. Tiny radiodensities in the superficial left heel soft tissues, cannot exclude tiny radiopaque foreign bodies within/just below the skin. Electronically Signed   By: Ilona Sorrel M.D.   On: 08/04/2016 14:59   Ct Head Wo Contrast  Result Date: 08/03/2016 CLINICAL DATA:  . Fall yesterday with striking head. Loss of consciousness with nausea and vomiting. EXAM: CT HEAD WITHOUT CONTRAST CT CERVICAL SPINE  WITHOUT CONTRAST TECHNIQUE: Multidetector CT imaging of the head and cervical spine was performed following the standard protocol without intravenous contrast. Multiplanar CT image reconstructions of the cervical spine were also generated. COMPARISON:  Head CT 12/08/2014 FINDINGS: CT HEAD FINDINGS No intracranial hemorrhage. No parenchymal contusion. No midline shift or mass effect. Basilar cisterns are patent. No skull base fracture. No fluid in the paranasal sinuses or mastoid air cells. Orbits are normal. Mild cortical atrophy and proportional ventricular dilatation. Encephalomalacia in LEFT frontal lobe beneath the cranioplasty site . Prior microplate fixation of the LEFT frontal bone with cranioplasty. Paranasal sinuses and mastoid air cells are clear. Orbits are clear. CT CERVICAL SPINE FINDINGS Reversal normal cervical lordosis. There are multiple small round lucent lesions within the cervical vertebral bodies. There is increased sclerosis of vertebral bodies. There is multiple levels of joint space narrowing and endplate spurring. No acute loss vertebral body height and disc height. Normal facet articulation. Normal craniocervical junction. IMPRESSION: 1. No acute intracranial findings. 2. Atrophy and microvascular disease. 3. LEFT frontal cranioplasty and encephalomalacia. 4. No cervical spine fracture. 5. Diffuse Sclerosis and multiple small lucency is cervical spine likely represent osteoporosis. Electronically Signed   By: Suzy Bouchard M.D.   On: 08/03/2016 12:27   Ct Cervical Spine Wo Contrast  Result Date: 08/03/2016 CLINICAL DATA:  . Fall yesterday with striking head. Loss of consciousness with nausea and vomiting. EXAM: CT HEAD WITHOUT CONTRAST CT CERVICAL SPINE WITHOUT CONTRAST TECHNIQUE: Multidetector CT imaging of the head and cervical spine was performed following the standard protocol without intravenous contrast. Multiplanar CT image reconstructions of the cervical spine were also  generated. COMPARISON:  Head CT 12/08/2014 FINDINGS: CT HEAD FINDINGS No intracranial hemorrhage. No parenchymal contusion. No midline shift or mass effect. Basilar cisterns are patent. No skull base fracture. No fluid in the paranasal sinuses or mastoid air cells. Orbits are normal. Mild cortical atrophy and proportional ventricular dilatation. Encephalomalacia in LEFT frontal lobe beneath the cranioplasty site . Prior microplate fixation of the LEFT frontal bone with cranioplasty. Paranasal sinuses and mastoid air cells are clear. Orbits are clear. CT CERVICAL SPINE FINDINGS Reversal normal cervical lordosis. There are multiple small round lucent lesions within the cervical vertebral bodies. There is increased sclerosis of vertebral bodies. There is multiple levels of joint space narrowing and endplate spurring. No acute loss vertebral body height and disc height. Normal facet articulation. Normal craniocervical junction. IMPRESSION: 1. No acute intracranial findings. 2. Atrophy and microvascular disease. 3. LEFT  frontal cranioplasty and encephalomalacia. 4. No cervical spine fracture. 5. Diffuse Sclerosis and multiple small lucency is cervical spine likely represent osteoporosis. Electronically Signed   By: Suzy Bouchard M.D.   On: 08/03/2016 12:27    Medications: I have reviewed the patient's current medications.  Assesment:  Principal Problem:   Syncope and collapse Active Problems:   Obese   Hypertension   Sleep apnea   Hypothyroid   Hyperlipidemia   Panhypopituitarism (HCC)   Moderate aortic regurgitation   Seizure disorder (HCC)   Diabetes (HCC)   Chronic combined systolic and diastolic CHF (congestive heart failure) (HCC)   Elevated troponin   CKD (chronic kidney disease) stage 4, GFR 15-29 ml/min (HCC)   COPD (chronic obstructive pulmonary disease) ([place])   CAD-minor CAD Jan 2016    Plan:  Medications reviewed  Will continue iv hydration Will monitor [facility] Cardiology consult  appreciated Physical therapy as tolerated.    LOS: 0 days   Dorn Hartshorne 08/05/2016, 8:04 AM

## 2016-08-05 NOTE — Progress Notes (Signed)
Orthostatic Vitals ordered. However patient unable to tolerate standing or sitting at side of the bed.  Vitals are as follows   08/05/16 0900  Orthostatic Lying   BP- Lying (!) 137/39  Pulse- Lying 112  Orthostatic Sitting  BP- Sitting 119/44  Pulse- Sitting 111

## 2016-08-05 NOTE — Progress Notes (Signed)
Unable to obtain orthostatic VS at this time. Pt unable to sit on the side of the bed or stand.

## 2016-08-06 DIAGNOSIS — Z8249 Family history of ischemic heart disease and other diseases of the circulatory system: Secondary | ICD-10-CM | POA: Diagnosis not present

## 2016-08-06 DIAGNOSIS — D649 Anemia, unspecified: Secondary | ICD-10-CM | POA: Diagnosis present

## 2016-08-06 DIAGNOSIS — J449 Chronic obstructive pulmonary disease, unspecified: Secondary | ICD-10-CM | POA: Diagnosis present

## 2016-08-06 DIAGNOSIS — E230 Hypopituitarism: Secondary | ICD-10-CM | POA: Diagnosis present

## 2016-08-06 DIAGNOSIS — E1122 Type 2 diabetes mellitus with diabetic chronic kidney disease: Secondary | ICD-10-CM | POA: Diagnosis present

## 2016-08-06 DIAGNOSIS — W19XXXA Unspecified fall, initial encounter: Secondary | ICD-10-CM | POA: Diagnosis present

## 2016-08-06 DIAGNOSIS — Y92009 Unspecified place in unspecified non-institutional (private) residence as the place of occurrence of the external cause: Secondary | ICD-10-CM | POA: Diagnosis not present

## 2016-08-06 DIAGNOSIS — Z9119 Patient's noncompliance with other medical treatment and regimen: Secondary | ICD-10-CM | POA: Diagnosis not present

## 2016-08-06 DIAGNOSIS — I272 Other secondary pulmonary hypertension: Secondary | ICD-10-CM | POA: Diagnosis present

## 2016-08-06 DIAGNOSIS — E039 Hypothyroidism, unspecified: Secondary | ICD-10-CM | POA: Diagnosis present

## 2016-08-06 DIAGNOSIS — I5042 Chronic combined systolic (congestive) and diastolic (congestive) heart failure: Secondary | ICD-10-CM | POA: Diagnosis present

## 2016-08-06 DIAGNOSIS — E876 Hypokalemia: Secondary | ICD-10-CM | POA: Diagnosis present

## 2016-08-06 DIAGNOSIS — I951 Orthostatic hypotension: Secondary | ICD-10-CM | POA: Diagnosis present

## 2016-08-06 DIAGNOSIS — I130 Hypertensive heart and chronic kidney disease with heart failure and stage 1 through stage 4 chronic kidney disease, or unspecified chronic kidney disease: Secondary | ICD-10-CM | POA: Diagnosis present

## 2016-08-06 DIAGNOSIS — Z905 Acquired absence of kidney: Secondary | ICD-10-CM | POA: Diagnosis not present

## 2016-08-06 DIAGNOSIS — G4730 Sleep apnea, unspecified: Secondary | ICD-10-CM | POA: Diagnosis present

## 2016-08-06 DIAGNOSIS — R55 Syncope and collapse: Secondary | ICD-10-CM | POA: Diagnosis present

## 2016-08-06 DIAGNOSIS — Z85528 Personal history of other malignant neoplasm of kidney: Secondary | ICD-10-CM | POA: Diagnosis not present

## 2016-08-06 DIAGNOSIS — Z809 Family history of malignant neoplasm, unspecified: Secondary | ICD-10-CM | POA: Diagnosis not present

## 2016-08-06 DIAGNOSIS — E785 Hyperlipidemia, unspecified: Secondary | ICD-10-CM | POA: Diagnosis present

## 2016-08-06 DIAGNOSIS — Z87891 Personal history of nicotine dependence: Secondary | ICD-10-CM | POA: Diagnosis not present

## 2016-08-06 DIAGNOSIS — J9611 Chronic respiratory failure with hypoxia: Secondary | ICD-10-CM | POA: Diagnosis present

## 2016-08-06 DIAGNOSIS — I2510 Atherosclerotic heart disease of native coronary artery without angina pectoris: Secondary | ICD-10-CM | POA: Diagnosis present

## 2016-08-06 DIAGNOSIS — N184 Chronic kidney disease, stage 4 (severe): Secondary | ICD-10-CM | POA: Diagnosis present

## 2016-08-06 DIAGNOSIS — I351 Nonrheumatic aortic (valve) insufficiency: Secondary | ICD-10-CM | POA: Diagnosis present

## 2016-08-06 DIAGNOSIS — Z9981 Dependence on supplemental oxygen: Secondary | ICD-10-CM | POA: Diagnosis not present

## 2016-08-06 DIAGNOSIS — G40909 Epilepsy, unspecified, not intractable, without status epilepticus: Secondary | ICD-10-CM | POA: Diagnosis present

## 2016-08-06 LAB — URINALYSIS, ROUTINE W REFLEX MICROSCOPIC
BILIRUBIN URINE: NEGATIVE
Glucose, UA: NEGATIVE mg/dL
Hgb urine dipstick: NEGATIVE
Ketones, ur: NEGATIVE mg/dL
Leukocytes, UA: NEGATIVE
NITRITE: NEGATIVE
Protein, ur: NEGATIVE mg/dL
SPECIFIC GRAVITY, URINE: 1.015 (ref 1.005–1.030)
pH: 5.5 (ref 5.0–8.0)

## 2016-08-06 LAB — BASIC METABOLIC PANEL
ANION GAP: 11 (ref 5–15)
BUN: 83 mg/dL — AB (ref 6–20)
CHLORIDE: 101 mmol/L (ref 101–111)
CO2: 23 mmol/L (ref 22–32)
Calcium: 8.6 mg/dL — ABNORMAL LOW (ref 8.9–10.3)
Creatinine, Ser: 3.23 mg/dL — ABNORMAL HIGH (ref 0.61–1.24)
GFR calc Af Amer: 21 mL/min — ABNORMAL LOW (ref >60)
GFR, EST NON AFRICAN AMERICAN: 18 mL/min — AB (ref >60)
Glucose, Bld: 100 mg/dL — ABNORMAL HIGH (ref 65–99)
POTASSIUM: 3.7 mmol/L (ref 3.5–5.1)
SODIUM: 135 mmol/L (ref 135–145)

## 2016-08-06 LAB — CBC
HEMATOCRIT: 31.9 % — AB (ref 39.0–52.0)
HEMOGLOBIN: 10.5 g/dL — AB (ref 13.0–17.0)
MCH: 29.9 pg (ref 26.0–34.0)
MCHC: 32.9 g/dL (ref 30.0–36.0)
MCV: 90.9 fL (ref 78.0–100.0)
Platelets: 141 10*3/uL — ABNORMAL LOW (ref 150–400)
RBC: 3.51 MIL/uL — AB (ref 4.22–5.81)
RDW: 14.6 % (ref 11.5–15.5)
WBC: 10.4 10*3/uL (ref 4.0–10.5)

## 2016-08-06 LAB — GLUCOSE, CAPILLARY: Glucose-Capillary: 91 mg/dL (ref 65–99)

## 2016-08-06 MED ORDER — ACETAMINOPHEN 325 MG PO TABS
650.0000 mg | ORAL_TABLET | Freq: Four times a day (QID) | ORAL | Status: DC | PRN
  Administered 2016-08-06: 650 mg via ORAL
  Filled 2016-08-06: qty 2

## 2016-08-06 NOTE — Clinical Social Work Placement (Signed)
   CLINICAL SOCIAL WORK PLACEMENT  NOTE  Date:  08/06/2016  Patient Details  Name: [name] [name] MRN: [id number] Date of Birth: 06/14/1946  Clinical Social Work is seeking post-discharge placement for this patient at the [place] level of care (*CSW will initial, date and re-position this form in  chart as items are completed):  Yes   Patient/family provided with [place] Work Department's list of facilities offering this level of care within the geographic area requested by the patient (or if unable, by the patient's family).  Yes   Patient/family informed of their freedom to choose among providers that offer the needed level of care, that participate in Medicare, Medicaid or managed care program needed by the patient, have an available bed and are willing to accept the patient.  Yes   Patient/family informed of [facility]'s ownership interest in [facility] and [facility], as well as of the fact that they are under no obligation to receive care at these facilities.  PASRR submitted to EDS on       PASRR number received on       Existing PASRR number confirmed on 08/06/16     FL2 transmitted to all facilities in geographic area requested by pt/family on 08/06/16     FL2 transmitted to all facilities within larger geographic area on       Patient informed that his/her managed care company has contracts with or will negotiate with certain facilities, including the following:        Yes   Patient/family informed of bed offers received.  Patient chooses bed at [facility]     Physician recommends and patient chooses bed at      Patient to be transferred to [facility] on  .  Patient to be transferred to facility by       Patient family notified on   of transfer.  Name of family member notified:        PHYSICIAN       Additional Comment:  [facility] has started authorization.    _______________________________________________ Salome Arnt, LCSW 08/06/2016, 12:09 PM [phone]

## 2016-08-06 NOTE — Care Management Note (Signed)
Case Management Note  Patient Details  Name: [name] MRN: [id number] Date of Birth: 1946/07/16   Expected Discharge Date:    08/08/2016              Expected Discharge Plan:  [place]  In-House Referral:  Clinical Social Work  Discharge planning Services  CM Consult  Post Acute Care Choice:  NA Choice offered to:  NA  DME Arranged:    DME Agency:     HH Arranged:    [place] Agency:     Status of Service:  Completed, signed off  If discussed at [employer], dates discussed:    Additional Comments: Pt now agreeable to SNF. CSW is aware and has made arrangements for placement. No CM needs at this time.   Sherald Barge, RN 08/06/2016, 3:22 PM

## 2016-08-06 NOTE — NC FL2 (Signed)
[place] MEDICAID FL2 LEVEL OF CARE SCREENING TOOL     IDENTIFICATION  Patient Name: [name] [name] Birthdate: September 23, 1946 Sex: male Admission Date (Current Location): 08/03/2016  [facility] and [place] Number:  [employer] and Address:  [facility] [address], [place]      Provider Number: [id number]  Attending Physician Name and Address:  [name], MD  Relative Name and Phone Number:       Current Level of Care: Hospital Recommended Level of Care: [place] Prior Approval Number:    Date Approved/Denied:   PASRR Number:  [id number]   Discharge Plan: SNF    Current Diagnoses: Patient Active Problem List   Diagnosis Date Noted  . CAD-minor CAD Jan 2016 08/04/2016  . Syncope and collapse 08/03/2016  . COPD (chronic obstructive pulmonary disease) ([place]) 08/03/2016  . Abdominal pain, epigastric 07/20/2016  . Acute exacerbation of CHF (congestive heart failure) ([place]) 06/11/2016  . Acute on chronic congestive heart failure ([place]) 06/11/2016  . COPD exacerbation ([place]) 03/26/2016  . CKD (chronic kidney disease) stage 4, GFR 15-29 ml/min (HCC) 03/26/2016  . Septic arthritis ([place]) 12/28/2015  . Elevated troponin 12/28/2015  . Acute on chronic renal failure ([place]) 12/28/2015  . COPD with exacerbation ([place]) 12/28/2015  . Congestive heart failure (CHF) ([place]) 10/26/2015  . 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 Morbid obesity due to excess calories ([place]) 09/14/2015  . Vitamin D deficiency 09/14/2015  . Thrombocytopenia due to blood loss   . GI bleed 03/03/2015  . Upper GI bleed 03/02/2015  . Seizures ([place]) 02/14/2015  . 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 Essential hypertension 02/14/2015  . Hemorrhoids 01/24/2015  . Constipation 01/24/2015  . Aortic insufficiency   . Sepsis ([place]) 12/06/2014  . Pyrexia   . Chronic combined systolic and  diastolic CHF  [id number]  . Chest pain 06/12/2014  . Seizure disorder ([place]) 03/27/2013  . Diabetes ([place]) 03/27/2013  . Hypertension   . Sleep apnea   . Hypothyroid   . Tobacco abuse, in remission   . Hyperlipidemia   . Panhypopituitarism ([place])   . Obese 10/28/2012  . Moderate aortic regurgitation 10/08/2012    Orientation RESPIRATION BLADDER Height & Weight     Self, Time, Situation, Place  O2 (2 L) Continent Weight: 245 lb 6.4 oz (111.3 kg) Height:  6\' 1"  (185.4 cm)  BEHAVIORAL SYMPTOMS/MOOD NEUROLOGICAL BOWEL NUTRITION STATUS  Other (Comment) (n/a) Convulsions/Seizures (history) Continent Diet (Heart healthy)  AMBULATORY STATUS COMMUNICATION OF NEEDS Skin   Total Care Verbally Normal                       Personal Care Assistance Level of Assistance  Bathing, Feeding, Dressing Bathing Assistance: Maximum assistance Feeding assistance: Limited assistance Dressing Assistance: Maximum assistance     Functional Limitations Info  Sight, Hearing, Speech Sight Info: Adequate Hearing Info: Adequate Speech Info: Adequate    SPECIAL CARE FACTORS FREQUENCY  PT (By licensed PT)                    Contractures Contractures Info: Not present    Additional Factors Info  Code Status, Allergies Code Status Info: Full code Allergies Info: No known allergies           Current Medications (08/06/2016):  This is the current hospital active medication list Current Facility-Administered Medications  Medication Dose Route Frequency Provider Last  Rate Last Dose  . 0.9 %  sodium chloride infusion  250 mL Intravenous PRN Kathie Dike, MD      . 0.9 %  sodium chloride infusion   Intravenous Continuous [name], MD 75 mL/hr at 08/05/16 1449    . albuterol (PROVENTIL) (2.5 MG/3ML) 0.083% nebulizer solution 3 mL  3 mL Inhalation Q6H PRN Kathie Dike, MD      . amLODipine (NORVASC) tablet 5 mg  5 mg Oral Daily Kathie Dike, MD   5 mg at 08/06/16 [id number]  . calcitRIOL  (ROCALTROL) capsule 0.5 mcg  0.5 mcg Oral BID Kathie Dike, MD   0.5 mcg at 08/06/16 [id number]  . carvedilol (COREG) tablet 3.125 mg  3.125 mg Oral BID WC Luke K Kilroy, PA-C      . cloNIDine (CATAPRES) tablet 0.2 mg  0.2 mg Oral TID Kathie Dike, MD   0.2 mg at 08/05/16 2130  . desmopressin (DDAVP) tablet 0.2 mg  0.2 mg Oral BID Kathie Dike, MD   0.2 mg at 08/06/16 [id number]  . fluticasone (FLONASE) 50 MCG/ACT nasal spray 2 spray  2 spray Each Nare Daily Kathie Dike, MD   2 spray at 08/06/16 0921  . gi cocktail (Maalox,Lidocaine,Donnatal)  30 mL Oral BID PRN [name], PA-C      . guaiFENesin [facility]) 12 hr tablet 1,200 mg  1,200 mg Oral BID Kathie Dike, MD   1,200 mg at 08/06/16 0922  . heparin injection 5,000 Units  5,000 Units Subcutaneous Q8H Kathie Dike, MD   5,000 Units at 08/06/16 [phone]  . HYDROcodone-acetaminophen (NORCO/VICODIN) 5-325 MG per tablet 1-2 tablet  1-2 tablet Oral Q4H PRN [name], MD   2 tablet at 08/06/16 0921  . isosorbide-hydrALAZINE (BIDIL) 20-37.5 MG per tablet 1 tablet  1 tablet Oral TID Kathie Dike, MD   1 tablet at 08/05/16 2130  . levothyroxine (SYNTHROID, LEVOTHROID) tablet 175 mcg  175 mcg Oral QAC breakfast Kathie Dike, MD   175 mcg at 08/06/16 [id number]  . MEDLINE mouth rinse  15 mL Mouth Rinse BID [name], MD   15 mL at 08/06/16 0925  . mometasone-formoterol (DULERA) 200-5 MCG/ACT inhaler 2 puff  2 puff Inhalation BID Kathie Dike, MD   2 puff at 08/06/16 0759  . ondansetron (ZOFRAN) tablet 4 mg  4 mg Oral Q6H PRN Kathie Dike, MD       Or  . ondansetron (ZOFRAN) injection 4 mg  4 mg Intravenous Q6H PRN Kathie Dike, MD      . pantoprazole (PROTONIX) EC tablet 40 mg  40 mg Oral BID Kathie Dike, MD   40 mg at 08/06/16 [id number]  . phenytoin (DILANTIN) ER capsule 200 mg  200 mg Oral Once per day on Mon Thu Tesfaye Fanta, MD   200 mg at 08/04/16 1009  . phenytoin (DILANTIN) ER capsule 300 mg  300 mg Oral Once per day on Sun Tue Wed Fri Sat  Kathie Dike, MD   300 mg at 08/06/16 [id number]  . polyethylene glycol (MIRALAX / GLYCOLAX) packet 17 g  17 g Oral Daily [name], MD   17 g at 08/06/16 0925  . simvastatin (ZOCOR) tablet 20 mg  20 mg Oral Daily Kathie Dike, MD   20 mg at 08/06/16 [id number]  . sodium chloride flush (NS) 0.9 % injection 3 mL  3 mL Intravenous Q12H Kathie Dike, MD   3 mL at 08/06/16 0926  . sodium chloride flush (NS) 0.9 % injection 3 mL  3 mL Intravenous Q12H Kathie Dike, MD   3 mL at 08/06/16 0925  . sodium chloride flush (NS) 0.9 % injection 3 mL  3 mL Intravenous PRN Kathie Dike, MD   3 mL at 08/04/16 1010  . tiotropium (SPIRIVA) inhalation capsule 18 mcg  18 mcg Inhalation Daily Kathie Dike, MD   18 mcg at 08/06/16 0800     Discharge Medications: Please see discharge summary for a list of discharge medications.  Relevant Imaging Results:  Relevant Lab Results:   Additional Information SSN: [id number]  Salome Arnt, [place]

## 2016-08-06 NOTE — Progress Notes (Signed)
Pt's temperature continues to run between 100.3-100.9.  Dr. Legrand Rams paged and made aware.  New orders received for UA and PRN Tylenol.  Will continue to monitor.

## 2016-08-06 NOTE — Progress Notes (Signed)
Subjective:  Feeling a little better. No further dizziness or presyncope  Objective:  Vital Signs in the last 24 hours: Temp:  [97.6 F (36.4 C)-100.4 F (38 C)] 100.4 F (38 C) (08/30 0514) Pulse Rate:  [83-106] 95 (08/30 0514) Resp:  [18-20] 18 (08/29 1945) BP: (127-153)/(39-78) 133/50 (08/30 0514) SpO2:  [96 %-99 %] 97 % (08/30 0801) Weight:  [245 lb 6.4 oz (111.3 kg)] 245 lb 6.4 oz (111.3 kg) (08/30 0514)  Intake/Output from previous day: 08/29 0701 - 08/30 0700 In: 2205 [P.O.:480; I.V.:1725] Out: 400 [Urine:400] Intake/Output from this shift: No intake/output data recorded.  Physical Exam: NECK: Without JVD, HJR, or bruit LUNGS: Decreased breath sounds with few crackles HEART: Regular rate and rhythm, [id number] diastolic murmur LSB, 2/6 sys murmur, no gallop, rub, bruit, thrill, or heave EXTREMITIES: Without cyanosis, clubbing, or edema   Lab Results:  Recent Labs  08/04/16 0609 08/06/16 0605  WBC 16.1* 10.4  HGB 12.5* 10.5*  PLT 136* 141*    Recent Labs  08/05/16 0620 08/06/16 0602  NA 134* 135  K 4.0 3.7  CL 100* 101  CO2 23 23  GLUCOSE 104* 100*  BUN 95* 83*  CREATININE 3.59* 3.23*    Recent Labs  08/04/16 0052 08/04/16 0609  TROPONINI 0.07* 0.07*   Hepatic Function Panel  Recent Labs  08/04/16 0609  PROT 7.2  ALBUMIN 3.5  AST 16  ALT 17  ALKPHOS 34*  BILITOT 1.0   No results for input(s): CHOL in the last 72 hours. No results for input(s): PROTIME in the last 72 hours.    Cardiac Studies:  Assessment/Plan:  1.Syncope when standing up and coughing. K 2.9 on admission. Troponins flat in setting of CKD. Sinus tachycardia improved on low dose coreg. 2. Mild CAD by cath 12/2014 3. Significant AI, not felt to be a surgical candidate 4. Severe COPD with chronic respiratory failure on home O2 5. Pulmonary HTN 6. H/o GI issues including acalculous cholecystitis 12/2014, GIB 03/2015 with possible candida esophagitis and mild gastritis 7.  Anemia 8. Chronic systolic CHF EF [id number] grade 3 DD echo Jan 2017 9. CKD stage IV crt 3.23    LOS: 0 days    Ermalinda Barrios 08/06/2016, 8:26 AM

## 2016-08-06 NOTE — Progress Notes (Signed)
Subjective: Patient has difficulty to ambulate. His right remained tender and swollen. Physical therapy has recommended nursing home placement. [name] being hydrated. His renal function is improving slightly. No cough, shortness of breath or wheezing. No abdominal pain, nausea or vomiting. Objective: Vital signs in last 24 hours: Temp:  [97.6 F (36.4 C)-100.4 F (38 C)] 100.4 F (38 C) (08/30 0514) Pulse Rate:  [83-106] 95 (08/30 0514) Resp:  [18-20] 18 (08/29 1945) BP: (127-153)/(39-78) 133/50 (08/30 0514) SpO2:  [96 %-99 %] 97 % (08/30 0801) Weight:  [111.3 kg (245 lb 6.4 oz)] 111.3 kg (245 lb 6.4 oz) (08/30 0514) Weight change: 2.041 kg (4 lb 8 oz) Last BM Date: 08/04/16  Intake/Output from previous day: 08/29 0701 - 08/30 0700 In: 2205 [P.O.:480; I.V.:1725] Out: 400 [Urine:400]  PHYSICAL EXAM General appearance: alert and no distress Resp: diminished breath sounds bilaterally and rhonchi bilaterally Cardio: S1, S2 normal GI: soft, non-tender; bowel sounds normal; no masses,  no organomegaly Extremities: tenderness, swelling decreased range of motion of  right knee  Lab Results:  Results for orders placed or performed during the hospital encounter of 08/03/16 (from the past 48 hour(s))  Basic metabolic panel     Status: Abnormal   Collection Time: 08/04/16  1:28 PM  Result Value Ref Range   Sodium 132 (L) 135 - 145 mmol/L   Potassium 3.4 (L) 3.5 - 5.1 mmol/L   Chloride 97 (L) 101 - 111 mmol/L   CO2 26 22 - 32 mmol/L   Glucose, Bld 158 (H) 65 - 99 mg/dL   BUN 97 (H) 6 - 20 mg/dL   Creatinine, Ser 3.37 (H) 0.61 - 1.24 mg/dL   Calcium 8.2 (L) 8.9 - 10.3 mg/dL   GFR calc non Af Amer 17 (L) >60 mL/min   GFR calc Af Amer 20 (L) >60 mL/min    Comment: (NOTE) The eGFR has been calculated using the CKD EPI equation. This calculation has not been validated in all clinical situations. eGFR's persistently <60 mL/min signify possible Chronic Kidney Disease.    Anion gap 9 5 -  15  Basic metabolic panel     Status: Abnormal   Collection Time: 08/05/16  6:20 AM  Result Value Ref Range   Sodium 134 (L) 135 - 145 mmol/L   Potassium 4.0 3.5 - 5.1 mmol/L   Chloride 100 (L) 101 - 111 mmol/L   CO2 23 22 - 32 mmol/L   Glucose, Bld 104 (H) 65 - 99 mg/dL   BUN 95 (H) 6 - 20 mg/dL   Creatinine, Ser 3.59 (H) 0.61 - 1.24 mg/dL   Calcium 8.5 (L) 8.9 - 10.3 mg/dL   GFR calc non Af Amer 16 (L) >60 mL/min   GFR calc Af Amer 18 (L) >60 mL/min    Comment: (NOTE) The eGFR has been calculated using the CKD EPI equation. This calculation has not been validated in all clinical situations. eGFR's persistently <60 mL/min signify possible Chronic Kidney Disease.    Anion gap 11 5 - 15  Glucose, capillary     Status: Abnormal   Collection Time: 08/05/16  7:22 AM  Result Value Ref Range   Glucose-Capillary 106 (H) 65 - 99 mg/dL  Basic metabolic panel     Status: Abnormal   Collection Time: 08/06/16  6:02 AM  Result Value Ref Range   Sodium 135 135 - 145 mmol/L   Potassium 3.7 3.5 - 5.1 mmol/L   Chloride 101 101 - 111 mmol/L  CO2 23 22 - 32 mmol/L   Glucose, Bld 100 (H) 65 - 99 mg/dL   BUN 83 (H) 6 - 20 mg/dL   Creatinine, Ser 3.23 (H) 0.61 - 1.24 mg/dL   Calcium 8.6 (L) 8.9 - 10.3 mg/dL   GFR calc non Af Amer 18 (L) >60 mL/min   GFR calc Af Amer 21 (L) >60 mL/min    Comment: (NOTE) The eGFR has been calculated using the CKD EPI equation. This calculation has not been validated in all clinical situations. eGFR's persistently <60 mL/min signify possible Chronic Kidney Disease.    Anion gap 11 5 - 15  CBC     Status: Abnormal   Collection Time: 08/06/16  6:05 AM  Result Value Ref Range   WBC 10.4 4.0 - 10.5 K/uL   RBC 3.51 (L) 4.22 - 5.81 MIL/uL   Hemoglobin 10.5 (L) 13.0 - 17.0 g/dL   HCT 31.9 (L) 39.0 - 52.0 %   MCV 90.9 78.0 - 100.0 fL   MCH 29.9 26.0 - 34.0 pg   MCHC 32.9 30.0 - 36.0 g/dL   RDW 14.6 11.5 - 15.5 %   Platelets 141 (L) 150 - 400 K/uL  Glucose,  capillary     Status: None   Collection Time: 08/06/16  7:33 AM  Result Value Ref Range   Glucose-Capillary 91 65 - 99 mg/dL   Comment 1 Notify RN    Comment 2 Document in Chart     ABGS No results for input(s): PHART, PO2ART, TCO2, HCO3 in the last 72 hours.  Invalid input(s): PCO2 CULTURES No results found for this or any previous visit (from the past 240 hour(s)). Studies/Results: Dg Knee 1-2 Views Right  Result Date: 08/04/2016 CLINICAL DATA:  Pain EXAM: RIGHT KNEE - 1-2 VIEW COMPARISON:  None. FINDINGS: Frontal and lateral views were obtained. There is no acute fracture or dislocation. There is a moderate joint effusion. There is slight narrowing medially and in the patellofemoral joint region. Lucency along the posterior surface of the patella is likely due to chondromalacia patella. 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 No acute fracture or dislocation. Electronically Signed   By: Lowella Grip III M.D.   On: 08/04/2016 14:59   Dg Ankle 2 Views Left  Result Date: 08/04/2016 CLINICAL DATA:  Inpatient.  Left ankle pain.  No reported injury. EXAM: LEFT ANKLE - 2 VIEW COMPARISON:  None. FINDINGS: Diffuse left ankle soft tissue swelling. No fracture, subluxation, appreciable arthropathy or suspicious focal osseous lesion. Tiny Achilles left calcaneal spur. Tiny radiodensities are seen in the superficial left heel soft tissues. IMPRESSION: 1. Diffuse left ankle soft tissue swelling. No fracture or malalignment. 2. Tiny Achilles left calcaneal spur. 3. Tiny radiodensities in the superficial left heel soft tissues, cannot exclude tiny radiopaque foreign bodies within/just below the skin. Electronically Signed   By: Ilona Sorrel M.D.   On: 08/04/2016 14:59    Medications: I have reviewed the patient's current medications.  Assesment:  Principal Problem:   Syncope and  collapse Active Problems:   Obese   Hypertension   Sleep apnea   Hypothyroid   Hyperlipidemia   Panhypopituitarism (HCC)   Moderate aortic regurgitation   Seizure disorder (HCC)   Diabetes (HCC)   Chronic combined systolic and diastolic CHF    Elevated troponin   CKD (chronic kidney disease) stage 4, GFR 15-29 ml/min (HCC)   COPD (chronic obstructive pulmonary  disease) [facility])   CAD-minor CAD Jan 2016    Plan:  Medications reviewed  Will continue iv hydration Will monitor BMP Social worker evaluation for nursing home placement.    LOS: 0 days   Mykai Wendorf 08/06/2016, 8:13 AM

## 2016-08-06 NOTE — Clinical Social Work Placement (Signed)
   CLINICAL SOCIAL WORK PLACEMENT  NOTE  Date:  08/06/2016  Patient Details  Name: [name] [name] MRN: [id number] Date of Birth: [birth year]  Clinical Social Work is seeking post-discharge placement for this patient at the [place] level of care (*CSW will initial, date and re-position this form in  chart as items are completed):  Yes   Patient/family provided with [facility] Work Department's list of facilities offering this level of care within the geographic area requested by the patient (or if unable, by the patient's family).  Yes   Patient/family informed of their freedom to choose among providers that offer the needed level of care, that participate in Medicare, Medicaid or managed care program needed by the patient, have an available bed and are willing to accept the patient.  Yes   Patient/family informed of [facility]'s ownership interest in [facility] and [facility], as well as of the fact that they are under no obligation to receive care at these facilities.  PASRR submitted to EDS on       PASRR number received on       Existing PASRR number confirmed on 08/06/16     FL2 transmitted to all facilities in geographic area requested by pt/family on 08/06/16     FL2 transmitted to all facilities within larger geographic area on       Patient informed that his/her managed care company has contracts with or will negotiate with certain facilities, including the following:            Patient/family informed of bed offers received.  Patient chooses bed at       Physician recommends and patient chooses bed at      Patient to be transferred to   on  .  Patient to be transferred to facility by       Patient family notified on   of transfer.  Name of family member notified:        PHYSICIAN       Additional Comment:    _______________________________________________ Salome Arnt, [place] 08/06/2016, 11:17  AM [phone]

## 2016-08-06 NOTE — Clinical Social Work Note (Signed)
Pt now agreeable to SNF and requests [facility]. Aware of need for insurance authorization.   Benay Pike, [place]

## 2016-08-06 NOTE — Progress Notes (Signed)
Physical Therapy Treatment Patient Details Name: [name] [name] MRN: [id number] DOB: [birth year] Today's Date: 08/06/2016    History of Present Illness 70 y.o. male with medical history significant of COPD, CKD stage IV, RBBB, CHF, hyperlipidemia, hypertension, obesity, and hypothyroidism. He presents to the ED with complaints of constant, gradually worsening left shoulder and rib cage pain that onset yesterday s/p ground level fall. Pt had a prolonged episode coughing yesterday that was followed by an episode of syncope. After pt began coughing, the next thing he remembers is waking up on the floor. It was unclear as to how long he was unconscious. He reports falling on his left side with resulting injury to his head, shoulder, and ribs. Pt did not seek medical attention at that time. He woke up this morning and was unable to get out of bed due to persistent pain, so he decided to come to the ED. He was recently discharged from the hospital after being treated for COPD exacerbation. He reports breathing has done fairely well since his discharge. He has not had any recurrence of wheezing. He denies chest pressure. After his syncope episode, he had an episode of vomiting.  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    PT Comments    Pt received in bed.  RN had been in earlier to give pt pain medication, and pt stated that he felt better today.  Pt continues to require increased assistance for all functional mobility.  Supine<>sit with HOB raised, and Max A.  Attempted sit<>stand with RW, but not able, therefore pt was able to perform transfer with SARA lift, and was able to get into the chair today.  Pt is greatly limited by a combination of pain  from his R knee, as well as his L ankle that continue to limit his ability to transfer and bear weight through his LE's, however, XR's have been (-).  Discussed my recommendations of SNF with pt, and he stated that he agreed that he would need rehab prior to returning home.     Follow Up Recommendations  SNF     Equipment Recommendations  None recommended by PT    Recommendations for Other Services       Precautions / Restrictions Precautions Precautions: Fall Restrictions Weight Bearing Restrictions: No    Mobility  Bed Mobility Overal bed mobility: Needs Assistance Bed Mobility: Supine to Sit     Supine to sit: Mod assist;HOB elevated;Max assist (increased time with HOB max raised, and use of bed pads to scoot hips to the EOB. )        Transfers Overall transfer level: Needs assistance               General transfer comment: Attempted sit<>stand with RW and bed height elevated, but again, pt is not able to clear buttocks off the mattress  Ambulation/Gait                 Stairs            Wheelchair Mobility    Modified Rankin (Stroke Patients Only)       Balance Overall balance assessment: Needs assistance Sitting-balance support: Bilateral upper extremity supported       Standing balance support: Bilateral upper extremity supported Standing balance-Leahy Scale: Fair  Cognition Arousal/Alertness: Awake/alert Behavior During Therapy: WFL for tasks assessed/performed Overall Cognitive Status: Within Functional Limits for tasks assessed                      Exercises      General Comments        Pertinent Vitals/Pain Faces Pain Scale: Hurts even more Pain Location: R knee, and L ankle Pain Descriptors / Indicators: Throbbing Pain Intervention(s): Limited activity within patient's tolerance;Monitored during session;Premedicated before session;Relaxation;Repositioned    Home Living                       Prior Function            PT Goals (current goals can now be found in the care plan section) Acute Rehab PT Goals Patient Stated Goal: Pt wants to get stronger.  PT Goal Formulation: With patient Time For Goal Achievement: 08/11/16 Progress towards PT goals: Progressing toward goals    Frequency  Min 4X/week    PT Plan Frequency needs to be updated    Co-evaluation             End of Session Equipment Utilized During Treatment: Gait belt;Other (comment) (SARA LIFT) Activity Tolerance: Patient limited by pain Patient left: in chair;with call bell/phone within reach     Time: 1007-1100 PT Time Calculation (min) (ACUTE ONLY): 53 min  Charges:  $Neuromuscular Re-education: 23-37 mins $Self Care/Home Management: 23-37                    G Codes:      Beth Briani Maul, PT, DPT X: [phone]

## 2016-08-07 LAB — BASIC METABOLIC PANEL
Anion gap: 9 (ref 5–15)
BUN: 75 mg/dL — AB (ref 6–20)
CALCIUM: 8.7 mg/dL — AB (ref 8.9–10.3)
CHLORIDE: 105 mmol/L (ref 101–111)
CO2: 22 mmol/L (ref 22–32)
CREATININE: 2.84 mg/dL — AB (ref 0.61–1.24)
GFR calc non Af Amer: 21 mL/min — ABNORMAL LOW (ref >60)
GFR, EST AFRICAN AMERICAN: 24 mL/min — AB (ref >60)
Glucose, Bld: 98 mg/dL (ref 65–99)
Potassium: 3.9 mmol/L (ref 3.5–5.1)
Sodium: 136 mmol/L (ref 135–145)

## 2016-08-07 LAB — GLUCOSE, CAPILLARY: Glucose-Capillary: 100 mg/dL — ABNORMAL HIGH (ref 65–99)

## 2016-08-07 NOTE — Progress Notes (Signed)
Physical Therapy Treatment Patient Details Name: [name] [name] MRN: [id number] DOB: January 13, 1946 Today's Date: 08/07/2016    History of Present Illness 70 y.o. male with medical history significant of COPD, CKD stage IV, RBBB, CHF, hyperlipidemia, hypertension, obesity, and hypothyroidism. He presents to the ED with complaints of constant, gradually worsening left shoulder and rib cage pain that onset yesterday s/p ground level fall. Pt had a prolonged episode coughing yesterday that was followed by an episode of syncope. After pt began coughing, the next thing he remembers is waking up on the floor. It was unclear as to how long he was unconscious. He reports falling on his left side with resulting injury to his head, shoulder, and ribs. Pt did not seek medical attention at that time. He woke up this morning and was unable to get out of bed due to persistent pain, so he decided to come to the ED. He was recently discharged from the hospital after being treated for COPD exacerbation. He reports breathing has done fairely well since his discharge. He has not had any recurrence of wheezing. He denies chest pressure. After his syncope episode, he had an episode of vomiting.  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    PT Comments    Pt received in bed, and was agreeable to PT tx.  Pt demonstrated improved bed mobility, but continues to require increased assistance and time to perform task.  Pt also continues to require SARA lift for sit<>stand transfers due to continued R knee pain, as well as L ankle pain.  Pt only required elevated bed height, but he was then able to stand with the machine instead of having to raise up in steps like yesterday.  He  was able to stand in the [place] for ~3 min while performing UE reaching activities.  Continue to recommend SNF due to poor mobility and pain.    Follow Up Recommendations  SNF     Equipment Recommendations  None recommended by PT    Recommendations for Other Services       Precautions / Restrictions Precautions Precautions: Fall Restrictions Weight Bearing Restrictions: (P) No    Mobility  Bed Mobility Overal bed mobility: Needs Assistance Bed Mobility: Supine to Sit     Supine to sit: Min assist;Mod assist;HOB elevated (increased time, and assistance to advance R LE off the EOB. )        Transfers Overall transfer level: Needs assistance               General transfer comment: Pt was actually able to pull himself up to stand today while using the SARA lift.    Ambulation/Gait Ambulation/Gait assistance:  (NA due to poor ability to perform sit<>stand transfers. )               Stairs            Wheelchair Mobility    Modified Rankin (Stroke Patients Only)       Balance Overall balance assessment: Needs assistance Sitting-balance support: Bilateral upper extremity supported Sitting balance-Leahy Scale: Fair     Standing balance support: Bilateral upper extremity supported Standing balance-Leahy Scale: Poor Standing balance comment: While supported in the [place], pt was able to perform B UE reaching activities to increase weight bearing through B  LE's, and improve posture.                      Cognition Arousal/Alertness: Awake/alert Behavior During Therapy: Flat affect Overall Cognitive Status: Within Functional Limits for tasks assessed                      Exercises      General Comments        Pertinent Vitals/Pain Pain Assessment:  (Pt continues to state that his R knee hurts, but does not rate. ) Pain Intervention(s): Premedicated before session    Home Living                      Prior Function             PT Goals (current goals can now be found in the care plan section) Acute Rehab PT Goals Patient Stated Goal: Pt wants to get stronger.  PT Goal Formulation: With patient Time For Goal Achievement: 08/11/16 Progress towards PT goals: Progressing toward goals    Frequency  Min 4X/week    PT Plan Frequency needs to be updated    Co-evaluation             End of Session Equipment Utilized During Treatment: Other (comment) (SARA lift) Activity Tolerance: Patient limited by pain Patient left: in chair;with call bell/phone within reach (hoyer lift in the chair. )     [id number] PT Time Calculation (min) (ACUTE ONLY): 36 min  Charges:  $Therapeutic Activity: 23-37 mins                    G Codes:      Beth Nissi Doffing, PT, DPT X: [phone]

## 2016-08-07 NOTE — Progress Notes (Signed)
Subjective: Patient feels better. His knee pain is better. He trying to work with physical therapy. His renal function is gradually improving..Patient has agreed for nursing home placement. Objective: Vital signs in last 24 hours: Temp:  [98 F (36.7 C)-100.7 F (38.2 C)] 98 F (36.7 C) (08/31 0606) Pulse Rate:  [68-103] 96 (08/31 0606) Resp:  [16-20] 20 (08/31 0606) BP: (120-149)/(39-44) 129/42 (08/31 0606) SpO2:  [95 %-98 %] 95 % (08/31 0606) Weight:  [109.9 kg (242 lb 3.2 oz)] 109.9 kg (242 lb 3.2 oz) (08/31 0606) Weight change: -1.452 kg (-3 lb 3.2 oz) Last BM Date: 08/03/16  Intake/Output from previous day: 08/30 0701 - 08/31 0700 In: 2307.5 [P.O.:480; I.V.:1827.5] Out: 1500 [Urine:1500]  PHYSICAL EXAM General appearance: alert and no distress Resp: diminished breath sounds bilaterally and rhonchi bilaterally Cardio: S1, S2 normal GI: soft, non-tender; bowel sounds normal; no masses,  no organomegaly Extremities: tenderness, swelling decreased range of motion of  right knee  Lab Results:  Results for orders placed or performed during the hospital encounter of 08/03/16 (from the past 48 hour(s))  Basic metabolic panel     Status: Abnormal   Collection Time: 08/06/16  6:02 AM  Result Value Ref Range   Sodium 135 135 - 145 mmol/L   Potassium 3.7 3.5 - 5.1 mmol/L   Chloride 101 101 - 111 mmol/L   CO2 23 22 - 32 mmol/L   Glucose, Bld 100 (H) 65 - 99 mg/dL   BUN 83 (H) 6 - 20 mg/dL   Creatinine, Ser 3.23 (H) 0.61 - 1.24 mg/dL   Calcium 8.6 (L) 8.9 - 10.3 mg/dL   GFR calc non Af Amer 18 (L) >60 mL/min   GFR calc Af Amer 21 (L) >60 mL/min    Comment: (NOTE) The eGFR has been calculated using the CKD EPI equation. This calculation has not been validated in all clinical situations. eGFR's persistently <60 mL/min signify possible Chronic Kidney Disease.    Anion gap 11 5 - 15  CBC     Status: Abnormal   Collection Time: 08/06/16  6:05 AM  Result Value Ref Range   WBC  10.4 4.0 - 10.5 K/uL   RBC 3.51 (L) 4.22 - 5.81 MIL/uL   Hemoglobin 10.5 (L) 13.0 - 17.0 g/dL   HCT 31.9 (L) 39.0 - 52.0 %   MCV 90.9 78.0 - 100.0 fL   MCH 29.9 26.0 - 34.0 pg   MCHC 32.9 30.0 - 36.0 g/dL   RDW 14.6 11.5 - 15.5 %   Platelets 141 (L) 150 - 400 K/uL  Glucose, capillary     Status: None   Collection Time: 08/06/16  7:33 AM  Result Value Ref Range   Glucose-Capillary 91 65 - 99 mg/dL   Comment 1 Notify RN    Comment 2 Document in Chart   Urinalysis, Routine w reflex microscopic (not at [facility])     Status: None   Collection Time: 08/06/16  3:31 PM  Result Value Ref Range   Color, Urine YELLOW YELLOW   APPearance CLEAR CLEAR   Specific Gravity, Urine 1.015 1.005 - 1.030   pH 5.5 5.0 - 8.0   Glucose, UA NEGATIVE NEGATIVE mg/dL   Hgb urine dipstick NEGATIVE NEGATIVE   Bilirubin Urine NEGATIVE NEGATIVE   Ketones, ur NEGATIVE NEGATIVE mg/dL   Protein, ur NEGATIVE NEGATIVE mg/dL   Nitrite NEGATIVE NEGATIVE   Leukocytes, UA NEGATIVE NEGATIVE    Comment: MICROSCOPIC NOT DONE ON URINES WITH NEGATIVE PROTEIN, BLOOD, LEUKOCYTES,  NITRITE, OR GLUCOSE <1000 mg/dL.  Basic metabolic panel     Status: Abnormal   Collection Time: 08/07/16  6:27 AM  Result Value Ref Range   Sodium 136 135 - 145 mmol/L   Potassium 3.9 3.5 - 5.1 mmol/L   Chloride 105 101 - 111 mmol/L   CO2 22 22 - 32 mmol/L   Glucose, Bld 98 65 - 99 mg/dL   BUN 75 (H) 6 - 20 mg/dL   Creatinine, Ser 2.84 (H) 0.61 - 1.24 mg/dL   Calcium 8.7 (L) 8.9 - 10.3 mg/dL   GFR calc non Af Amer 21 (L) >60 mL/min   GFR calc Af Amer 24 (L) >60 mL/min    Comment: (NOTE) The eGFR has been calculated using the CKD EPI equation. This calculation has not been validated in all clinical situations. eGFR's persistently <60 mL/min signify possible Chronic Kidney Disease.    Anion gap 9 5 - 15  Glucose, capillary     Status: Abnormal   Collection Time: 08/07/16  8:08 AM  Result Value Ref Range   Glucose-Capillary 100 (H) 65 - 99  mg/dL   Comment 1 Notify RN    Comment 2 Document in Chart     ABGS No results for input(s): PHART, PO2ART, TCO2, HCO3 in the last 72 hours.  Invalid input(s): PCO2 CULTURES No results found for this or any previous visit (from the past 240 hour(s)). Studies/Results: No results found.  Medications: I have reviewed the patient's current medications.  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    LOS: 1 day   Vedanshi Massaro 08/07/2016, 8:15 AM

## 2016-08-08 ENCOUNTER — Inpatient Hospital Stay
Admission: RE | Admit: 2016-08-08 | Discharge: 2016-09-05 | Disposition: A | Discharge status: Home / Self Care | Payer: Medicare HMO | Source: Ambulatory Visit | Attending: Internal Medicine | Admitting: Internal Medicine

## 2016-08-08 LAB — GLUCOSE, CAPILLARY
GLUCOSE-CAPILLARY: 102 mg/dL — AB (ref 65–99)
Glucose-Capillary: 96 mg/dL (ref 65–99)

## 2016-08-08 LAB — BASIC METABOLIC PANEL
ANION GAP: 10 (ref 5–15)
BUN: 72 mg/dL — ABNORMAL HIGH (ref 6–20)
CALCIUM: 8.6 mg/dL — AB (ref 8.9–10.3)
CHLORIDE: 105 mmol/L (ref 101–111)
CO2: 21 mmol/L — AB (ref 22–32)
Creatinine, Ser: 2.38 mg/dL — ABNORMAL HIGH (ref 0.61–1.24)
GFR calc non Af Amer: 26 mL/min — ABNORMAL LOW (ref >60)
GFR, EST AFRICAN AMERICAN: 30 mL/min — AB (ref >60)
GLUCOSE: 99 mg/dL (ref 65–99)
POTASSIUM: 4.1 mmol/L (ref 3.5–5.1)
Sodium: 136 mmol/L (ref 135–145)

## 2016-08-08 MED ORDER — ONDANSETRON HCL 4 MG/2ML IJ SOLN: 4.0000 mg | Freq: Four times a day (QID) | INTRAMUSCULAR | 0 refills | Status: DC | PRN

## 2016-08-08 MED ORDER — ACETAMINOPHEN 325 MG PO TABS: 650.0000 mg | ORAL_TABLET | Freq: Four times a day (QID) | ORAL | 3 refills | Status: DC | PRN

## 2016-08-08 MED ORDER — CARVEDILOL 3.125 MG PO TABS: 3.1250 mg | ORAL_TABLET | Freq: Two times a day (BID) | ORAL | 3 refills | Status: DC

## 2016-08-08 MED ORDER — TORSEMIDE 20 MG PO TABS: 20.0000 mg | ORAL_TABLET | Freq: Two times a day (BID) | ORAL | 6 refills | Status: DC

## 2016-08-08 MED ORDER — ONDANSETRON HCL 4 MG PO TABS: 4.0000 mg | ORAL_TABLET | Freq: Four times a day (QID) | ORAL | 0 refills | Status: DC | PRN

## 2016-08-08 MED ORDER — HYDROCODONE-ACETAMINOPHEN 5-325 MG PO TABS: 1.0000 | ORAL_TABLET | Freq: Four times a day (QID) | ORAL | 0 refills | Status: DC | PRN

## 2016-08-08 NOTE — Clinical Social Work Placement (Signed)
   CLINICAL SOCIAL WORK PLACEMENT  NOTE  Date:  08/08/2016  Patient Details  Name: [name] [name] MRN: [id number] Date of Birth: Apr 29, 1946  Clinical Social Work is seeking post-discharge placement for this patient at the [place] level of care (*CSW will initial, date and re-position this form in  chart as items are completed):  Yes   Patient/family provided with [place] Work Department's list of facilities offering this level of care within the geographic area requested by the patient (or if unable, by the patient's family).  Yes   Patient/family informed of their freedom to choose among providers that offer the needed level of care, that participate in Medicare, Medicaid or managed care program needed by the patient, have an available bed and are willing to accept the patient.  Yes   Patient/family informed of [place]'s ownership interest in [facility] and [facility], as well as of the fact that they are under no obligation to receive care at these facilities.  PASRR submitted to EDS on       PASRR number received on       Existing PASRR number confirmed on 08/06/16     FL2 transmitted to all facilities in geographic area requested by pt/family on 08/06/16     FL2 transmitted to all facilities within larger geographic area on       Patient informed that his/her managed care company has contracts with or will negotiate with certain facilities, including the following:        Yes   Patient/family informed of bed offers received.  Patient chooses bed at [facility]     Physician recommends and patient chooses bed at      Patient to be transferred to [facility] on 08/08/16.  Patient to be transferred to facility by staff     Patient family notified on 08/08/16 of transfer.  Name of family member notified:  [name]- daughter     PHYSICIAN       Additional Comment:  Authorization received.    _______________________________________________ Salome Arnt, [place] 08/08/2016, 2:18 PM [phone]

## 2016-08-08 NOTE — Progress Notes (Signed)
Discharged to [facility] in stable condition with oxygen via bed with staff.

## 2016-08-08 NOTE — Progress Notes (Signed)
Report given to D. Shiflet, LPN at [facility].

## 2016-08-08 NOTE — Care Management Important Message (Signed)
Important Message  Patient Details  Name: [name] [name] MRN: [id number] Date of Birth: 06/07/1946   Medicare Important Message Given:  Yes    Sherald Barge, RN 08/08/2016, 9:35 AM

## 2016-08-08 NOTE — Discharge Summary (Signed)
Physician Discharge Summary  Patient ID: [name] [name] MRN: [id number] DOB/AGE: [birth year] 70 y.o. 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   ADVAIR DISKUS 250-50 MCG/DOSE Aepb Generic drug:  Fluticasone-Salmeterol Inhale 1 puff into the lungs 2 (two) times daily.   albuterol 108 (90 Base) MCG/ACT inhaler Commonly known as:  PROVENTIL HFA;VENTOLIN HFA Inhale 2 puffs into the lungs every 4 (four) hours as needed for wheezing or shortness of breath.   albuterol (2.5 MG/3ML) 0.083% nebulizer solution Commonly known as:  PROVENTIL Inhale 3 mLs into the lungs every 6 (six) hours as needed. Shortness of breath/wheezing   calcitRIOL 0.5 MCG capsule Commonly known as:  ROCALTROL Take 0.5 mcg by mouth 2 (two) times daily.   carvedilol 3.125 MG tablet Commonly known as:  COREG Take 1 tablet (3.125 mg total) by mouth 2 (two) times daily with a meal.   desmopressin 0.2 MG tablet Commonly known as:  DDAVP Take 1 tablet (0.2 mg  total) by mouth 2 (two) times daily.   fluticasone 50 MCG/ACT nasal spray Commonly known as:  FLONASE Place 2 sprays into both nostrils daily.   HYDROcodone-acetaminophen 5-325 MG tablet Commonly known as:  NORCO/VICODIN Take 1 tablet by mouth every 6 (six) hours as needed for moderate pain.   isosorbide-hydrALAZINE 20-37.5 MG tablet Commonly known as:  BIDIL Take 1 tablet by mouth 3 (three) times daily.   levothyroxine 175 MCG tablet Commonly known as:  SYNTHROID, LEVOTHROID TAKE ONE TABLET BY MOUTH ONCE DAILY.   ondansetron 4 MG tablet Commonly known as:  ZOFRAN Take 1 tablet (4 mg total) by mouth every 6 (six) hours as needed for nausea.   ondansetron 4 MG/2ML Soln injection Commonly known as:  ZOFRAN Inject 2 mLs (4 mg total) into the vein every 6 (six) hours as needed for nausea.   pantoprazole 40 MG tablet Commonly known as:  PROTONIX Take 1 tablet (40 mg total) by mouth 2 (two) times daily before a meal. What changed:  when to take this   phenytoin 100 MG ER capsule Commonly known as:  DILANTIN Take 200-300 mg by mouth daily. Takes 200 mg on Monday and Thursdsay. On all other days of the week takes 300 mg.   polyethylene glycol powder powder Commonly known as:  GLYCOLAX/MIRALAX Take 17 g by mouth daily. May decrease to 17 g three times a week if needed. What changed:  additional instructions  potassium chloride SA 20 MEQ tablet Commonly known as:  K-DUR,KLOR-CON Take 20 mEq by mouth 2 (two) times daily.   simvastatin 20 MG tablet Commonly known as:  ZOCOR Take 20 mg by mouth daily.   tiotropium 18 MCG inhalation capsule Commonly known as:  SPIRIVA Place 18 mcg into inhaler and inhale daily.   torsemide 20 MG tablet Commonly known as:  DEMADEX Take 1 tablet (20 mg total) by mouth 2 (two) times daily. What changed:  how much to take       Discharged Condition: improved    Consults: cardiology  Significant Diagnostic Studies: Ct Abdomen Pelvis  Wo Contrast  Result Date: 07/20/2016 CLINICAL DATA:  Epigastric abdominal pain for 2 days with nausea, vomiting and diarrhea. EXAM: CT ABDOMEN AND PELVIS WITHOUT CONTRAST TECHNIQUE: Multidetector CT imaging of the abdomen and pelvis was performed following the standard protocol without IV contrast. COMPARISON:  03/26/2016 unenhanced CT abdomen/ pelvis. FINDINGS: Lower chest: Right lower lobe 1.1 cm solid pulmonary nodule (series 3/ image 4), stable since at least 08/08/2014, considered benign. Hepatobiliary: Normal liver with no liver mass. Normal gallbladder with no radiopaque cholelithiasis. No biliary ductal dilatation. Pancreas: Normal, with no mass or duct dilation. Spleen: Normal size. No mass. Adrenals/Urinary Tract: Normal adrenals. Right nephrectomy. No mass or fluid collection in the right nephrectomy bed. No left hydronephrosis. Several hyperdense renal cortical lesions in the left kidney, largest 2.2 standard in the anterior interpolar left kidney, stable in size back to 08/08/2014, suggesting benign renal cysts. Stable minimally complex 1.4 cm renal cyst with mural calcification in the anterior lateral interpolar left kidney. Simple posterior medial 2.0 cm interpolar left renal cyst. No left renal stones. Top normal caliber left ureter with no left ureteral stones. Collapsed and grossly normal bladder. Stomach/Bowel: Grossly normal stomach. Normal caliber small bowel with no small bowel wall thickening. Normal appendix. Normal large bowel with no diverticulosis, large bowel wall thickening or pericolonic fat stranding. Oral contrast progresses to the rectum. Vascular/Lymphatic: Atherosclerotic nonaneurysmal abdominal aorta. No pathologically enlarged lymph nodes in the abdomen or pelvis. Reproductive: Top-normal size prostate. Other: No pneumoperitoneum, ascites or focal fluid collection. Stable subcutaneous 9.0 cm benign appearing lipoma in the right lower chest wall. Musculoskeletal: No aggressive  appearing focal osseous lesions. Mild-to-moderate thoracolumbar spondylosis. IMPRESSION: 1. No acute abnormality. No evidence of bowel obstruction or acute bowel inflammation. 2. Right nephrectomy with no abnormal findings in the right nephrectomy bed. Several mildly complex left renal cysts, all stable in size back 08/08/2014, suggesting benign renal cysts. 3. Aortic atherosclerosis. Electronically Signed   By: Ilona Sorrel M.D.   On: 07/20/2016 07:29   Dg Ribs Unilateral W/chest Left  Result Date: 08/03/2016 CLINICAL DATA:  Left anterior mid to lower rib pain EXAM: LEFT RIBS AND CHEST - 3+ VIEW COMPARISON:  07/20/2016 FINDINGS: Heart size appears enlarged. No pleural effusion or edema. No airspace opacities identified. No rib fractures identified. IMPRESSION: 1. No acute cardiopulmonary abnormalities. 2. No displaced rib fractures identified. Electronically Signed   By: Kerby Moors M.D.   On: 08/03/2016 11:42   Dg Knee 1-2 Views Right  Result Date: 08/04/2016 CLINICAL DATA:  Pain EXAM: RIGHT KNEE - 1-2 VIEW COMPARISON:  None. FINDINGS: Frontal and lateral views were obtained. There is no acute fracture or dislocation. There is a moderate joint effusion. There is slight narrowing medially and in the patellofemoral joint region. Lucency along the posterior surface of the patella is likely due to chondromalacia patella. 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 No acute fracture or dislocation. Electronically Signed   By: Lowella Grip III M.D.   On: 08/04/2016 14:59   Dg Ankle 2 Views Left  Result Date: 08/04/2016 CLINICAL DATA:  Inpatient.  Left ankle pain.  No reported injury. EXAM: LEFT ANKLE - 2 VIEW COMPARISON:  None. FINDINGS: Diffuse left ankle soft tissue swelling. No fracture, subluxation, appreciable arthropathy or suspicious focal osseous lesion. Tiny Achilles left calcaneal spur.  Tiny radiodensities are seen in the superficial left heel soft tissues. IMPRESSION: 1. Diffuse left ankle soft tissue swelling. No fracture or malalignment. 2. Tiny Achilles left calcaneal spur. 3. Tiny radiodensities in the superficial left heel soft tissues, cannot exclude tiny radiopaque foreign bodies within/just below the skin. Electronically Signed   By: Ilona Sorrel M.D.   On: 08/04/2016 14:59   Ct Head Wo Contrast  Result Date: 08/03/2016 CLINICAL DATA:  . Fall yesterday with striking head. Loss of consciousness with nausea and vomiting. EXAM: CT HEAD WITHOUT CONTRAST CT CERVICAL SPINE WITHOUT CONTRAST TECHNIQUE: Multidetector CT imaging of the head and cervical spine was performed following the standard protocol without intravenous contrast. Multiplanar CT image reconstructions of the cervical spine were also generated. COMPARISON:  Head CT 12/08/2014 FINDINGS: CT HEAD FINDINGS No intracranial hemorrhage. No parenchymal contusion. No midline shift or mass effect. Basilar cisterns are patent. No skull base fracture. No fluid in the paranasal sinuses or mastoid air cells. Orbits are normal. Mild cortical atrophy and proportional ventricular dilatation. Encephalomalacia in LEFT frontal lobe beneath the cranioplasty site . Prior microplate fixation of the LEFT frontal bone with cranioplasty. Paranasal sinuses and mastoid air cells are clear. Orbits are clear. CT CERVICAL SPINE FINDINGS Reversal normal cervical lordosis. There are multiple small round lucent lesions within the cervical vertebral bodies. There is increased sclerosis of vertebral bodies. There is multiple levels of joint space narrowing and endplate spurring. No acute loss vertebral body height and disc height. Normal facet articulation. Normal craniocervical junction. IMPRESSION: 1. No acute intracranial findings. 2. Atrophy and microvascular disease. 3. LEFT frontal cranioplasty and encephalomalacia. 4. No cervical spine fracture. 5. Diffuse  Sclerosis and multiple small lucency is cervical spine likely represent osteoporosis. Electronically Signed   By: Suzy Bouchard M.D.   On: 08/03/2016 12:27   Ct Cervical Spine Wo Contrast  Result Date: 08/03/2016 CLINICAL DATA:  . Fall yesterday with striking head. Loss of consciousness with nausea and vomiting. EXAM: CT HEAD WITHOUT CONTRAST CT CERVICAL SPINE WITHOUT CONTRAST TECHNIQUE: Multidetector CT imaging of the head and cervical spine was performed following the standard protocol without intravenous contrast. Multiplanar CT image reconstructions of the cervical spine were also generated. COMPARISON:  Head CT 12/08/2014 FINDINGS: CT HEAD FINDINGS No intracranial hemorrhage. No parenchymal contusion. No midline shift or mass effect. Basilar cisterns are patent. No skull base fracture. No fluid in the paranasal sinuses or mastoid air cells. Orbits are normal. Mild cortical atrophy and proportional ventricular dilatation. Encephalomalacia in LEFT frontal lobe beneath the cranioplasty site . Prior microplate fixation of the LEFT frontal bone with cranioplasty. Paranasal sinuses and mastoid air cells are clear. Orbits are clear. CT CERVICAL SPINE FINDINGS Reversal normal cervical lordosis. There are multiple small round lucent lesions within the cervical vertebral bodies. There is increased sclerosis of vertebral bodies. There is multiple levels of joint space narrowing and endplate spurring. No acute loss vertebral body height and disc height. Normal facet articulation. Normal  craniocervical junction. IMPRESSION: 1. No acute intracranial findings. 2. Atrophy and microvascular disease. 3. LEFT frontal cranioplasty and encephalomalacia. 4. No cervical spine fracture. 5. Diffuse Sclerosis and multiple small lucency is cervical spine likely represent osteoporosis. Electronically Signed   By: Suzy Bouchard M.D.   On: 08/03/2016 12:27   Dg Chest Port 1 View  Result Date: 07/20/2016 CLINICAL DATA:   Shortness breath.  Wheezing. EXAM: PORTABLE CHEST 1 VIEW COMPARISON:  06/11/2016 chest radiograph. FINDINGS: Stable cardiomediastinal silhouette with mild cardiomegaly and aortic arch atherosclerosis. No pneumothorax. No pleural effusion. Borderline mild pulmonary edema. No consolidative airspace disease. IMPRESSION: Borderline mild congestive heart failure. Aortic atherosclerosis. Electronically Signed   By: Ilona Sorrel M.D.   On: 07/20/2016 08:41   Dg Abd 2 Views  Result Date: 07/20/2016 CLINICAL DATA:  Acute onset of upper abdominal pain, nausea, vomiting and diarrhea. Initial encounter. EXAM: ABDOMEN - 2 VIEW COMPARISON:  CT of the abdomen and pelvis performed 03/26/2016 FINDINGS: The visualized bowel gas pattern is unremarkable. Scattered stool and air are seen within the colon; there is no evidence of small bowel dilatation to suggest obstruction. No free intra-abdominal air is identified on the provided upright view. Scattered clips are seen overlying the right mid abdomen. No acute osseous abnormalities are seen; the sacroiliac joints are unremarkable in appearance. The visualized portions of the lungs are grossly clear. IMPRESSION: Unremarkable bowel gas pattern; no free intra-abdominal air seen. Electronically Signed   By: Garald Balding M.D.   On: 07/20/2016 01:49    Lab Results: Basic Metabolic Panel:  Recent Labs  08/07/16 0627 08/08/16 0619  NA 136 136  K 3.9 4.1  CL 105 105  CO2 22 21*  GLUCOSE 98 99  BUN 75* 72*  CREATININE 2.84* 2.38*  CALCIUM 8.7* 8.6*   Liver Function Tests: No results for input(s): AST, ALT, ALKPHOS, BILITOT, PROT, ALBUMIN in the last 72 hours.   CBC:  Recent Labs  08/06/16 0605  WBC 10.4  HGB 10.5*  HCT 31.9*  MCV 90.9  PLT 141*    No results found for this or any previous visit (from the past 240 hour(s)).   Hospital Course:   This is a 70 years old male with history of multiple complicated medical illnesses was admitted due to  syncopal episode. Patient passed out and fell at home while talking on the phone. Patient was brought to ER and was evaluated. He had worsening of his renal function and slightly elevated troponin. X-ray of the right knee showed swelling and soft tissue injury. No fracture or dislocation. Patient was admitted under telemetry and was evaluated by cardiology and cardiac arrythmia and acute coronary syndrome was ruled out. Echo showed an Ef of 55%. Patient was rehydrated. His diuretic was held initially and restarted on lower dose on discharge. His Norvasc and clonidine discontinued due orthostatic hypotension low low diastolic B/P. Arrangement is made with skilled nursing home for placement so that patient will receive physical therapy. His B/P and BMP need to be monitored and his antihypertensives and his diuretics need to be adjusted  Discharge Exam: Blood pressure (!) 130/54, pulse 76, temperature 98.1 F (36.7 C), temperature source Oral, resp. rate (!) 21, height 6\' 1"  (1.854 m), weight 113.1 kg (249 lb 4.8 oz), SpO2 98 %.    Disposition:  Nursing home.    Follow-up Information    [facility] EMERGENCY DEPARTMENT.   Specialty:  Emergency Medicine Contact information: [place] [id number] [place]  [id number]          Signed: Lonnel Gjerde   08/08/2016, 8:20 AM

## 2016-08-09 ENCOUNTER — Other Ambulatory Visit (HOSPITAL_COMMUNITY)
Admission: AD | Admit: 2016-08-09 | Discharge: 2016-08-09 | Disposition: A | Discharge status: Home / Self Care | Payer: Medicare HMO | Source: Skilled Nursing Facility | Attending: Internal Medicine | Admitting: Internal Medicine

## 2016-08-09 DIAGNOSIS — N184 Chronic kidney disease, stage 4 (severe): Secondary | ICD-10-CM | POA: Diagnosis present

## 2016-08-09 LAB — BASIC METABOLIC PANEL
Anion gap: 7 (ref 5–15)
BUN: 69 mg/dL — AB (ref 6–20)
CHLORIDE: 106 mmol/L (ref 101–111)
CO2: 24 mmol/L (ref 22–32)
CREATININE: 2.37 mg/dL — AB (ref 0.61–1.24)
Calcium: 8.9 mg/dL (ref 8.9–10.3)
GFR calc Af Amer: 30 mL/min — ABNORMAL LOW (ref >60)
GFR calc non Af Amer: 26 mL/min — ABNORMAL LOW (ref >60)
Glucose, Bld: 94 mg/dL (ref 65–99)
Potassium: 4 mmol/L (ref 3.5–5.1)
Sodium: 137 mmol/L (ref 135–145)

## 2016-08-09 LAB — CBC
HCT: 32.1 % — ABNORMAL LOW (ref 39.0–52.0)
Hemoglobin: 10.8 g/dL — ABNORMAL LOW (ref 13.0–17.0)
MCH: 30.6 pg (ref 26.0–34.0)
MCHC: 33.6 g/dL (ref 30.0–36.0)
MCV: 90.9 fL (ref 78.0–100.0)
PLATELETS: 173 10*3/uL (ref 150–400)
RBC: 3.53 MIL/uL — AB (ref 4.22–5.81)
RDW: 14.5 % (ref 11.5–15.5)
WBC: 7.8 10*3/uL (ref 4.0–10.5)

## 2016-08-12 ENCOUNTER — Non-Acute Institutional Stay (SKILLED_NURSING_FACILITY): Payer: Medicare HMO | Admitting: Internal Medicine

## 2016-08-12 ENCOUNTER — Encounter: Payer: Self-pay | Admitting: Internal Medicine

## 2016-08-12 DIAGNOSIS — I5042 Chronic combined systolic (congestive) and diastolic (congestive) heart failure: Secondary | ICD-10-CM

## 2016-08-12 DIAGNOSIS — N189 Chronic kidney disease, unspecified: Secondary | ICD-10-CM

## 2016-08-12 DIAGNOSIS — I10 Essential (primary) hypertension: Secondary | ICD-10-CM

## 2016-08-12 DIAGNOSIS — N184 Chronic kidney disease, stage 4 (severe): Secondary | ICD-10-CM

## 2016-08-12 DIAGNOSIS — D631 Anemia in chronic kidney disease: Secondary | ICD-10-CM

## 2016-08-12 DIAGNOSIS — J449 Chronic obstructive pulmonary disease, unspecified: Secondary | ICD-10-CM | POA: Diagnosis not present

## 2016-08-12 DIAGNOSIS — R55 Syncope and collapse: Secondary | ICD-10-CM | POA: Diagnosis not present

## 2016-08-12 DIAGNOSIS — E1122 Type 2 diabetes mellitus with diabetic chronic kidney disease: Secondary | ICD-10-CM | POA: Diagnosis not present
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
## 2016-08-13 ENCOUNTER — Other Ambulatory Visit: Payer: Self-pay | Admitting: *Deleted

## 2016-08-13 MED ORDER — HYDROCODONE-ACETAMINOPHEN 5-325 MG PO TABS: ORAL_TABLET | ORAL | 0 refills | Status: DC

## 2016-08-13 NOTE — Telephone Encounter (Signed)
[facility] #[phone] Fax: [phone]   

## 2016-08-14 ENCOUNTER — Non-Acute Institutional Stay (SKILLED_NURSING_FACILITY): Payer: Medicare HMO | Admitting: Internal Medicine

## 2016-08-14 DIAGNOSIS — R600 Localized edema: Secondary | ICD-10-CM | POA: Diagnosis not present

## 2016-08-14 DIAGNOSIS — I5042 Chronic combined systolic (congestive) and diastolic (congestive) heart failure: Secondary | ICD-10-CM | POA: Diagnosis not present

## 2016-08-14 DIAGNOSIS — M25561 Pain in right knee: Secondary | ICD-10-CM | POA: Diagnosis not present
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
## 2016-08-18 ENCOUNTER — Encounter: Payer: Self-pay | Admitting: Internal Medicine

## 2016-08-18 ENCOUNTER — Non-Acute Institutional Stay (SKILLED_NURSING_FACILITY): Payer: Medicare HMO | Admitting: Internal Medicine

## 2016-08-18 DIAGNOSIS — R55 Syncope and collapse: Secondary | ICD-10-CM | POA: Diagnosis not present

## 2016-08-18 DIAGNOSIS — I5042 Chronic combined systolic (congestive) and diastolic (congestive) heart failure: Secondary | ICD-10-CM | POA: Diagnosis not present

## 2016-08-18 DIAGNOSIS — I10 Essential (primary) hypertension: Secondary | ICD-10-CM | POA: Diagnosis not present

## 2016-08-18 DIAGNOSIS — G4700 Insomnia, unspecified: Secondary | ICD-10-CM | POA: Diagnosis not present

## 2016-08-18 DIAGNOSIS — N184 Chronic kidney disease, stage 4 (severe): Secondary | ICD-10-CM | POA: Diagnosis not present
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
## 2016-08-19 ENCOUNTER — Encounter (HOSPITAL_COMMUNITY)
Admission: RE | Admit: 2016-08-19 | Discharge: 2016-08-19 | Disposition: A | Discharge status: Home / Self Care | Payer: Medicare HMO | Source: Skilled Nursing Facility | Attending: Internal Medicine | Admitting: Internal Medicine

## 2016-08-19 LAB — CBC
HCT: 32.7 % — ABNORMAL LOW (ref 39.0–52.0)
HEMOGLOBIN: 10.9 g/dL — AB (ref 13.0–17.0)
MCH: 30.5 pg (ref 26.0–34.0)
MCHC: 33.3 g/dL (ref 30.0–36.0)
MCV: 91.6 fL (ref 78.0–100.0)
Platelets: 279 10*3/uL (ref 150–400)
RBC: 3.57 MIL/uL — ABNORMAL LOW (ref 4.22–5.81)
RDW: 14.7 % (ref 11.5–15.5)
WBC: 7 10*3/uL (ref 4.0–10.5)

## 2016-08-19 LAB — BASIC METABOLIC PANEL
Anion gap: 11 (ref 5–15)
BUN: 58 mg/dL — AB (ref 6–20)
CO2: 24 mmol/L (ref 22–32)
Calcium: 10.1 mg/dL (ref 8.9–10.3)
Chloride: 99 mmol/L — ABNORMAL LOW (ref 101–111)
Creatinine, Ser: 2.74 mg/dL — ABNORMAL HIGH (ref 0.61–1.24)
GFR calc Af Amer: 25 mL/min — ABNORMAL LOW (ref >60)
GFR calc non Af Amer: 22 mL/min — ABNORMAL LOW (ref >60)
Glucose, Bld: 92 mg/dL (ref 65–99)
Potassium: 4.4 mmol/L (ref 3.5–5.1)
SODIUM: 134 mmol/L — AB (ref 135–145)

## 2016-08-21 ENCOUNTER — Non-Acute Institutional Stay (SKILLED_NURSING_FACILITY): Payer: Medicare HMO | Admitting: Internal Medicine

## 2016-08-21 ENCOUNTER — Encounter (HOSPITAL_COMMUNITY)
Admission: RE | Admit: 2016-08-21 | Discharge: 2016-08-21 | Disposition: A | Discharge status: Home / Self Care | Payer: Medicare HMO | Source: Skilled Nursing Facility | Attending: *Deleted | Admitting: *Deleted

## 2016-08-21 DIAGNOSIS — J3089 Other allergic rhinitis: Secondary | ICD-10-CM | POA: Diagnosis not present

## 2016-08-21 DIAGNOSIS — N289 Disorder of kidney and ureter, unspecified: Secondary | ICD-10-CM

## 2016-08-21 DIAGNOSIS — I5042 Chronic combined systolic (congestive) and diastolic (congestive) heart failure: Secondary | ICD-10-CM

## 2016-08-21 LAB — BASIC METABOLIC PANEL
Anion gap: 10 (ref 5–15)
BUN: 56 mg/dL — AB (ref 6–20)
CO2: 26 mmol/L (ref 22–32)
Calcium: 10.1 mg/dL (ref 8.9–10.3)
Chloride: 97 mmol/L — ABNORMAL LOW (ref 101–111)
Creatinine, Ser: 2.7 mg/dL — ABNORMAL HIGH (ref 0.61–1.24)
GFR calc non Af Amer: 22 mL/min — ABNORMAL LOW (ref >60)
GFR, EST AFRICAN AMERICAN: 26 mL/min — AB (ref >60)
Glucose, Bld: 86 mg/dL (ref 65–99)
POTASSIUM: 4.6 mmol/L (ref 3.5–5.1)
Sodium: 133 mmol/L — ABNORMAL LOW (ref 135–145)

## 2016-08-25 ENCOUNTER — Other Ambulatory Visit (HOSPITAL_COMMUNITY)
Admission: RE | Admit: 2016-08-25 | Discharge: 2016-08-25 | Disposition: A | Discharge status: Home / Self Care | Payer: Medicare HMO | Source: Skilled Nursing Facility | Attending: Internal Medicine | Admitting: Internal Medicine

## 2016-08-25 DIAGNOSIS — I5042 Chronic combined systolic (congestive) and diastolic (congestive) heart failure: Secondary | ICD-10-CM | POA: Insufficient documentation

## 2016-08-25 LAB — BASIC METABOLIC PANEL
Anion gap: 7 (ref 5–15)
BUN: 62 mg/dL — AB (ref 6–20)
CALCIUM: 9.7 mg/dL (ref 8.9–10.3)
CHLORIDE: 98 mmol/L — AB (ref 101–111)
CO2: 28 mmol/L (ref 22–32)
CREATININE: 2.68 mg/dL — AB (ref 0.61–1.24)
GFR calc Af Amer: 26 mL/min — ABNORMAL LOW (ref >60)
GFR calc non Af Amer: 22 mL/min — ABNORMAL LOW (ref >60)
GLUCOSE: 78 mg/dL (ref 65–99)
Potassium: 4.3 mmol/L (ref 3.5–5.1)
Sodium: 133 mmol/L — ABNORMAL LOW (ref 135–145)

## 2016-08-26 ENCOUNTER — Non-Acute Institutional Stay (SKILLED_NURSING_FACILITY): Payer: Medicare HMO | Admitting: Internal Medicine

## 2016-08-26 ENCOUNTER — Encounter: Payer: Self-pay | Admitting: Internal Medicine

## 2016-08-26 DIAGNOSIS — N184 Chronic kidney disease, stage 4 (severe): Secondary | ICD-10-CM

## 2016-08-26 DIAGNOSIS — I5042 Chronic combined systolic (congestive) and diastolic (congestive) heart failure: Secondary | ICD-10-CM

## 2016-08-26 DIAGNOSIS — I10 Essential (primary) hypertension: Secondary | ICD-10-CM

## 2016-08-26 DIAGNOSIS — I351 Nonrheumatic aortic (valve) insufficiency: Secondary | ICD-10-CM
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
## 2016-08-29 ENCOUNTER — Encounter: Payer: Self-pay | Admitting: Internal Medicine

## 2016-08-30 ENCOUNTER — Encounter: Payer: Self-pay | Admitting: Internal Medicine
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
## 2016-09-01 ENCOUNTER — Encounter (HOSPITAL_COMMUNITY)
Admission: RE | Admit: 2016-09-01 | Discharge: 2016-09-01 | Disposition: A | Discharge status: Home / Self Care | Payer: Medicare HMO | Source: Skilled Nursing Facility | Attending: *Deleted | Admitting: *Deleted

## 2016-09-01 ENCOUNTER — Non-Acute Institutional Stay: Payer: Medicare HMO | Admitting: Internal Medicine

## 2016-09-01 ENCOUNTER — Encounter: Payer: Self-pay | Admitting: Internal Medicine

## 2016-09-01 DIAGNOSIS — M542 Cervicalgia: Secondary | ICD-10-CM | POA: Diagnosis not present

## 2016-09-01 DIAGNOSIS — L918 Other hypertrophic disorders of the skin: Secondary | ICD-10-CM | POA: Diagnosis not present

## 2016-09-01 DIAGNOSIS — N184 Chronic kidney disease, stage 4 (severe): Secondary | ICD-10-CM

## 2016-09-01 DIAGNOSIS — I5022 Chronic systolic (congestive) heart failure: Secondary | ICD-10-CM | POA: Diagnosis not present

## 2016-09-01 LAB — BASIC METABOLIC PANEL
ANION GAP: 10 (ref 5–15)
BUN: 60 mg/dL — ABNORMAL HIGH (ref 6–20)
CHLORIDE: 98 mmol/L — AB (ref 101–111)
CO2: 27 mmol/L (ref 22–32)
CREATININE: 2.58 mg/dL — AB (ref 0.61–1.24)
Calcium: 9.6 mg/dL (ref 8.9–10.3)
GFR calc non Af Amer: 23 mL/min — ABNORMAL LOW (ref >60)
GFR, EST AFRICAN AMERICAN: 27 mL/min — AB (ref >60)
GLUCOSE: 100 mg/dL — AB (ref 65–99)
Potassium: 4 mmol/L (ref 3.5–5.1)
Sodium: 135 mmol/L (ref 135–145)
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
## 2016-09-04 ENCOUNTER — Encounter: Payer: Self-pay | Admitting: Internal Medicine

## 2016-09-04 ENCOUNTER — Non-Acute Institutional Stay (SKILLED_NURSING_FACILITY): Payer: Medicare HMO | Admitting: Internal Medicine

## 2016-09-04 DIAGNOSIS — N184 Chronic kidney disease, stage 4 (severe): Secondary | ICD-10-CM

## 2016-09-04 DIAGNOSIS — G40909 Epilepsy, unspecified, not intractable, without status epilepticus: Secondary | ICD-10-CM

## 2016-09-04 DIAGNOSIS — E039 Hypothyroidism, unspecified: Secondary | ICD-10-CM | POA: Diagnosis not present

## 2016-09-04 DIAGNOSIS — J449 Chronic obstructive pulmonary disease, unspecified: Secondary | ICD-10-CM | POA: Diagnosis not present

## 2016-09-04 DIAGNOSIS — R55 Syncope and collapse: Secondary | ICD-10-CM

## 2016-09-04 DIAGNOSIS — I10 Essential (primary) hypertension: Secondary | ICD-10-CM | POA: Diagnosis not present

## 2016-09-04 DIAGNOSIS — I5042 Chronic combined systolic (congestive) and diastolic (congestive) heart failure: Secondary | ICD-10-CM | POA: Diagnosis not present
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
## 2016-09-10 ENCOUNTER — Other Ambulatory Visit: Payer: Self-pay | Admitting: "Endocrinology

## 2016-09-12 ENCOUNTER — Other Ambulatory Visit: Payer: Self-pay | Admitting: "Endocrinology

## 2016-09-12 ENCOUNTER — Inpatient Hospital Stay (HOSPITAL_COMMUNITY)
Admission: EM | Admit: 2016-09-12 | Discharge: 2016-09-15 | DRG: 291 | Disposition: A | Discharge status: Home / Self Care | Payer: Medicare HMO | Attending: Internal Medicine | Admitting: Internal Medicine

## 2016-09-12 ENCOUNTER — Emergency Department (HOSPITAL_COMMUNITY): Payer: Medicare HMO

## 2016-09-12 ENCOUNTER — Inpatient Hospital Stay (HOSPITAL_COMMUNITY): Payer: Medicare HMO

## 2016-09-12 ENCOUNTER — Encounter (HOSPITAL_COMMUNITY): Payer: Self-pay | Admitting: Emergency Medicine

## 2016-09-12 DIAGNOSIS — J441 Chronic obstructive pulmonary disease with (acute) exacerbation: Secondary | ICD-10-CM | POA: Diagnosis present

## 2016-09-12 DIAGNOSIS — Z905 Acquired absence of kidney: Secondary | ICD-10-CM

## 2016-09-12 DIAGNOSIS — W19XXXA Unspecified fall, initial encounter: Secondary | ICD-10-CM | POA: Diagnosis present

## 2016-09-12 DIAGNOSIS — J9611 Chronic respiratory failure with hypoxia: Secondary | ICD-10-CM | POA: Diagnosis present

## 2016-09-12 DIAGNOSIS — I272 Pulmonary hypertension, unspecified: Secondary | ICD-10-CM | POA: Diagnosis present

## 2016-09-12 DIAGNOSIS — I5043 Acute on chronic combined systolic (congestive) and diastolic (congestive) heart failure: Secondary | ICD-10-CM | POA: Diagnosis present

## 2016-09-12 DIAGNOSIS — E230 Hypopituitarism: Secondary | ICD-10-CM | POA: Diagnosis present

## 2016-09-12 DIAGNOSIS — G40909 Epilepsy, unspecified, not intractable, without status epilepticus: Secondary | ICD-10-CM

## 2016-09-12 DIAGNOSIS — I4510 Unspecified right bundle-branch block: Secondary | ICD-10-CM | POA: Diagnosis present

## 2016-09-12 DIAGNOSIS — E039 Hypothyroidism, unspecified: Secondary | ICD-10-CM | POA: Diagnosis present

## 2016-09-12 DIAGNOSIS — N184 Chronic kidney disease, stage 4 (severe): Secondary | ICD-10-CM | POA: Diagnosis present

## 2016-09-12 DIAGNOSIS — E785 Hyperlipidemia, unspecified: Secondary | ICD-10-CM | POA: Diagnosis present

## 2016-09-12 DIAGNOSIS — Z87891 Personal history of nicotine dependence: Secondary | ICD-10-CM | POA: Diagnosis not present

## 2016-09-12 DIAGNOSIS — N289 Disorder of kidney and ureter, unspecified: Secondary | ICD-10-CM

## 2016-09-12 DIAGNOSIS — Z79899 Other long term (current) drug therapy: Secondary | ICD-10-CM | POA: Diagnosis not present

## 2016-09-12 DIAGNOSIS — R0602 Shortness of breath: Secondary | ICD-10-CM | POA: Diagnosis not present

## 2016-09-12 DIAGNOSIS — G4733 Obstructive sleep apnea (adult) (pediatric): Secondary | ICD-10-CM | POA: Diagnosis present

## 2016-09-12 DIAGNOSIS — I351 Nonrheumatic aortic (valve) insufficiency: Secondary | ICD-10-CM | POA: Diagnosis present

## 2016-09-12 DIAGNOSIS — Z85528 Personal history of other malignant neoplasm of kidney: Secondary | ICD-10-CM | POA: Diagnosis not present

## 2016-09-12 DIAGNOSIS — Z6828 Body mass index (BMI) 28.0-28.9, adult: Secondary | ICD-10-CM

## 2016-09-12 DIAGNOSIS — Z809 Family history of malignant neoplasm, unspecified: Secondary | ICD-10-CM | POA: Diagnosis not present

## 2016-09-12 DIAGNOSIS — Z9981 Dependence on supplemental oxygen: Secondary | ICD-10-CM | POA: Diagnosis not present

## 2016-09-12 DIAGNOSIS — E1122 Type 2 diabetes mellitus with diabetic chronic kidney disease: Secondary | ICD-10-CM | POA: Diagnosis present

## 2016-09-12 DIAGNOSIS — E033 Postinfectious hypothyroidism: Secondary | ICD-10-CM | POA: Diagnosis not present

## 2016-09-12 DIAGNOSIS — I5033 Acute on chronic diastolic (congestive) heart failure: Secondary | ICD-10-CM | POA: Diagnosis not present

## 2016-09-12 DIAGNOSIS — E559 Vitamin D deficiency, unspecified: Secondary | ICD-10-CM | POA: Diagnosis present

## 2016-09-12 DIAGNOSIS — Z8249 Family history of ischemic heart disease and other diseases of the circulatory system: Secondary | ICD-10-CM

## 2016-09-12 DIAGNOSIS — Z9119 Patient's noncompliance with other medical treatment and regimen: Secondary | ICD-10-CM

## 2016-09-12 DIAGNOSIS — I130 Hypertensive heart and chronic kidney disease with heart failure and stage 1 through stage 4 chronic kidney disease, or unspecified chronic kidney disease: Secondary | ICD-10-CM | POA: Diagnosis present

## 2016-09-12 DIAGNOSIS — M25512 Pain in left shoulder: Secondary | ICD-10-CM | POA: Diagnosis present

## 2016-09-12 DIAGNOSIS — G4730 Sleep apnea, unspecified: Secondary | ICD-10-CM | POA: Diagnosis present

## 2016-09-12 DIAGNOSIS — I5042 Chronic combined systolic (congestive) and diastolic (congestive) heart failure: Secondary | ICD-10-CM | POA: Diagnosis present

## 2016-09-12 LAB — TROPONIN I
Troponin I: 0.03 ng/mL (ref <0.03)
Troponin I: 0.03 ng/mL (ref <0.03)
Troponin I: 0.03 ng/mL (ref <0.03)

## 2016-09-12 LAB — CBC WITH DIFFERENTIAL/PLATELET
BASOS ABS: 0.1 10*3/uL (ref 0.0–0.1)
BASOS PCT: 1 %
EOS ABS: 0.9 10*3/uL — AB (ref 0.0–0.7)
EOS PCT: 16 %
HCT: 35.3 % — ABNORMAL LOW (ref 39.0–52.0)
HEMOGLOBIN: 11.7 g/dL — AB (ref 13.0–17.0)
Lymphocytes Relative: 20 %
Lymphs Abs: 1.2 10*3/uL (ref 0.7–4.0)
MCH: 30.2 pg (ref 26.0–34.0)
MCHC: 33.1 g/dL (ref 30.0–36.0)
MCV: 91.2 fL (ref 78.0–100.0)
Monocytes Absolute: 0.6 10*3/uL (ref 0.1–1.0)
Monocytes Relative: 11 %
NEUTROS PCT: 52 %
Neutro Abs: 3.1 10*3/uL (ref 1.7–7.7)
PLATELETS: 140 10*3/uL — AB (ref 150–400)
RBC: 3.87 MIL/uL — AB (ref 4.22–5.81)
RDW: 14.2 % (ref 11.5–15.5)
WBC: 6 10*3/uL (ref 4.0–10.5)

## 2016-09-12 LAB — COMPREHENSIVE METABOLIC PANEL
ALBUMIN: 3.8 g/dL (ref 3.5–5.0)
ALK PHOS: 46 U/L (ref 38–126)
ALT: 17 U/L (ref 17–63)
AST: 22 U/L (ref 15–41)
Anion gap: 10 (ref 5–15)
BUN: 61 mg/dL — AB (ref 6–20)
CALCIUM: 9.5 mg/dL (ref 8.9–10.3)
CHLORIDE: 94 mmol/L — AB (ref 101–111)
CO2: 31 mmol/L (ref 22–32)
CREATININE: 3 mg/dL — AB (ref 0.61–1.24)
GFR calc non Af Amer: 20 mL/min — ABNORMAL LOW (ref >60)
GFR, EST AFRICAN AMERICAN: 23 mL/min — AB (ref >60)
GLUCOSE: 97 mg/dL (ref 65–99)
Potassium: 3.2 mmol/L — ABNORMAL LOW (ref 3.5–5.1)
SODIUM: 135 mmol/L (ref 135–145)
Total Bilirubin: 0.4 mg/dL (ref 0.3–1.2)
Total Protein: 7.8 g/dL (ref 6.5–8.1)

## 2016-09-12 LAB — URINALYSIS, ROUTINE W REFLEX MICROSCOPIC
Bilirubin Urine: NEGATIVE
Glucose, UA: NEGATIVE mg/dL
HGB URINE DIPSTICK: NEGATIVE
Ketones, ur: NEGATIVE mg/dL
Leukocytes, UA: NEGATIVE
Nitrite: NEGATIVE
PH: 8 (ref 5.0–8.0)
Protein, ur: NEGATIVE mg/dL
SPECIFIC GRAVITY, URINE: 1.01 (ref 1.005–1.030)

## 2016-09-12 LAB — BRAIN NATRIURETIC PEPTIDE: B NATRIURETIC PEPTIDE 5: 298 pg/mL — AB (ref 0.0–100.0)

## 2016-09-12 MED ORDER — IPRATROPIUM BROMIDE 0.02 % IN SOLN
0.5000 mg | Freq: Once | RESPIRATORY_TRACT | Status: AC
  Administered 2016-09-12: 0.5 mg via RESPIRATORY_TRACT
  Filled 2016-09-12: qty 2.5

## 2016-09-12 MED ORDER — FUROSEMIDE 10 MG/ML IJ SOLN
60.0000 mg | Freq: Two times a day (BID) | INTRAMUSCULAR | Status: DC
  Administered 2016-09-12 – 2016-09-15 (×6): 60 mg via INTRAVENOUS
  Filled 2016-09-12 (×5): qty 6

## 2016-09-12 MED ORDER — SODIUM CHLORIDE 0.9% FLUSH: 3.0000 mL | INTRAVENOUS | Status: DC | PRN

## 2016-09-12 MED ORDER — CARVEDILOL 3.125 MG PO TABS
3.1250 mg | ORAL_TABLET | Freq: Two times a day (BID) | ORAL | Status: DC
  Administered 2016-09-12 – 2016-09-15 (×6): 3.125 mg via ORAL
  Filled 2016-09-12 (×6): qty 1

## 2016-09-12 MED ORDER — METOLAZONE 2.5 MG PO TABS
2.5000 mg | ORAL_TABLET | Freq: Once | ORAL | Status: AC
  Administered 2016-09-12: 2.5 mg via ORAL
  Filled 2016-09-12: qty 1

## 2016-09-12 MED ORDER — ASPIRIN EC 81 MG PO TBEC
81.0000 mg | DELAYED_RELEASE_TABLET | Freq: Every day | ORAL | Status: DC
  Administered 2016-09-12 – 2016-09-15 (×4): 81 mg via ORAL
  Filled 2016-09-12 (×4): qty 1

## 2016-09-12 MED ORDER — MOMETASONE FURO-FORMOTEROL FUM 200-5 MCG/ACT IN AERO
2.0000 | INHALATION_SPRAY | Freq: Two times a day (BID) | RESPIRATORY_TRACT | Status: DC
  Administered 2016-09-12 – 2016-09-15 (×6): 2 via RESPIRATORY_TRACT
  Filled 2016-09-12: qty 8.8

## 2016-09-12 MED ORDER — ALBUTEROL SULFATE (2.5 MG/3ML) 0.083% IN NEBU: 3.0000 mL | INHALATION_SOLUTION | RESPIRATORY_TRACT | Status: DC | PRN

## 2016-09-12 MED ORDER — SODIUM CHLORIDE 0.9% FLUSH
3.0000 mL | Freq: Two times a day (BID) | INTRAVENOUS | Status: DC
  Administered 2016-09-12 – 2016-09-15 (×6): 3 mL via INTRAVENOUS

## 2016-09-12 MED ORDER — PHENYTOIN SODIUM EXTENDED 100 MG PO CAPS
100.0000 mg | ORAL_CAPSULE | Freq: Every day | ORAL | Status: DC
  Administered 2016-09-12 – 2016-09-15 (×4): 100 mg via ORAL
  Filled 2016-09-12 (×4): qty 1

## 2016-09-12 MED ORDER — POLYETHYLENE GLYCOL 3350 17 G PO PACK
17.0000 g | PACK | Freq: Every day | ORAL | Status: DC
  Administered 2016-09-12 – 2016-09-14 (×3): 17 g via ORAL
  Filled 2016-09-12 (×4): qty 1

## 2016-09-12 MED ORDER — ALBUTEROL SULFATE (2.5 MG/3ML) 0.083% IN NEBU
5.0000 mg | INHALATION_SOLUTION | Freq: Once | RESPIRATORY_TRACT | Status: AC
  Administered 2016-09-12: 5 mg via RESPIRATORY_TRACT
  Filled 2016-09-12: qty 6

## 2016-09-12 MED ORDER — SODIUM CHLORIDE 0.9 % IV SOLN: 250.0000 mL | INTRAVENOUS | Status: DC | PRN

## 2016-09-12 MED ORDER — ONDANSETRON HCL 4 MG/2ML IJ SOLN: 4.0000 mg | Freq: Four times a day (QID) | INTRAMUSCULAR | Status: DC | PRN

## 2016-09-12 MED ORDER — LEVOTHYROXINE SODIUM 75 MCG PO TABS
150.0000 ug | ORAL_TABLET | Freq: Every day | ORAL | Status: DC
  Administered 2016-09-13 – 2016-09-15 (×3): 150 ug via ORAL
  Filled 2016-09-12 (×4): qty 2

## 2016-09-12 MED ORDER — POTASSIUM CHLORIDE CRYS ER 20 MEQ PO TBCR
40.0000 meq | EXTENDED_RELEASE_TABLET | Freq: Four times a day (QID) | ORAL | Status: AC
  Administered 2016-09-12 (×2): 40 meq via ORAL
  Filled 2016-09-12 (×2): qty 2

## 2016-09-12 MED ORDER — LEVOFLOXACIN 750 MG PO TABS
750.0000 mg | ORAL_TABLET | ORAL | Status: DC
  Administered 2016-09-12 – 2016-09-14 (×2): 750 mg via ORAL
  Filled 2016-09-12 (×3): qty 1

## 2016-09-12 MED ORDER — FLUTICASONE PROPIONATE 50 MCG/ACT NA SUSP
2.0000 | Freq: Every day | NASAL | Status: DC | PRN
  Administered 2016-09-13 – 2016-09-14 (×2): 2 via NASAL
  Filled 2016-09-12: qty 16

## 2016-09-12 MED ORDER — DESMOPRESSIN ACETATE 0.2 MG PO TABS
0.2000 mg | ORAL_TABLET | Freq: Two times a day (BID) | ORAL | Status: DC
  Administered 2016-09-13 – 2016-09-15 (×5): 0.2 mg via ORAL
  Filled 2016-09-12 (×10): qty 1

## 2016-09-12 MED ORDER — TIOTROPIUM BROMIDE MONOHYDRATE 18 MCG IN CAPS
18.0000 ug | ORAL_CAPSULE | Freq: Every day | RESPIRATORY_TRACT | Status: DC
  Administered 2016-09-13 – 2016-09-15 (×3): 18 ug via RESPIRATORY_TRACT
  Filled 2016-09-12: qty 5

## 2016-09-12 MED ORDER — SIMVASTATIN 20 MG PO TABS
20.0000 mg | ORAL_TABLET | Freq: Every day | ORAL | Status: DC
  Administered 2016-09-12 – 2016-09-15 (×4): 20 mg via ORAL
  Filled 2016-09-12 (×4): qty 1

## 2016-09-12 MED ORDER — VITAMIN D (ERGOCALCIFEROL) 1.25 MG (50000 UNIT) PO CAPS
50000.0000 [IU] | ORAL_CAPSULE | ORAL | Status: DC
  Filled 2016-09-12: qty 1

## 2016-09-12 MED ORDER — METHYLPREDNISOLONE SODIUM SUCC 125 MG IJ SOLR
125.0000 mg | Freq: Once | INTRAMUSCULAR | Status: AC
  Administered 2016-09-12: 125 mg via INTRAVENOUS
  Filled 2016-09-12: qty 2

## 2016-09-12 MED ORDER — HEPARIN SODIUM (PORCINE) 5000 UNIT/ML IJ SOLN
5000.0000 [IU] | Freq: Three times a day (TID) | INTRAMUSCULAR | Status: DC
  Administered 2016-09-12 – 2016-09-15 (×8): 5000 [IU] via SUBCUTANEOUS
  Filled 2016-09-12 (×7): qty 1

## 2016-09-12 MED ORDER — METHYLPREDNISOLONE SODIUM SUCC 125 MG IJ SOLR
60.0000 mg | Freq: Two times a day (BID) | INTRAMUSCULAR | Status: DC
  Administered 2016-09-12 – 2016-09-13 (×4): 60 mg via INTRAVENOUS
  Filled 2016-09-12 (×4): qty 2

## 2016-09-12 MED ORDER — HYDROCODONE-ACETAMINOPHEN 5-325 MG PO TABS: 1.0000 | ORAL_TABLET | Freq: Four times a day (QID) | ORAL | Status: DC | PRN

## 2016-09-12 MED ORDER — ISOSORB DINITRATE-HYDRALAZINE 20-37.5 MG PO TABS
1.0000 | ORAL_TABLET | Freq: Three times a day (TID) | ORAL | Status: DC
  Administered 2016-09-12 – 2016-09-15 (×9): 1 via ORAL
  Filled 2016-09-12 (×15): qty 1

## 2016-09-12 MED ORDER — MAGNESIUM SULFATE 2 GM/50ML IV SOLN
2.0000 g | Freq: Once | INTRAVENOUS | Status: AC
  Administered 2016-09-12: 2 g via INTRAVENOUS
  Filled 2016-09-12: qty 50

## 2016-09-12 MED ORDER — ACETAMINOPHEN 325 MG PO TABS
650.0000 mg | ORAL_TABLET | Freq: Four times a day (QID) | ORAL | Status: DC | PRN
  Administered 2016-09-12: 650 mg via ORAL
  Filled 2016-09-12: qty 2

## 2016-09-12 MED ORDER — CALCITRIOL 0.25 MCG PO CAPS
0.5000 ug | ORAL_CAPSULE | Freq: Two times a day (BID) | ORAL | Status: DC
  Administered 2016-09-12 – 2016-09-15 (×6): 0.5 ug via ORAL
  Filled 2016-09-12 (×6): qty 2

## 2016-09-12 NOTE — ED Triage Notes (Signed)
PT c/o SOB worsening over the past 2 days and states has been wearing his normal prn oxygen continuously at 2L per n/c. PT denies any chest pain today.

## 2016-09-12 NOTE — Progress Notes (Signed)
Pharmacy Antibiotic Note  HIKARU SOWERBY is a 70 y.o. male admitted on 09/12/2016 with URI.  Pharmacy has been consulted for [facility] dosing.  Plan: Levaquin 750mg  PO q48hrs (renally adjusted) Monitor labs, renal fxn, progress, and c/s  Height: 6\' 1"  (185.4 cm) Weight: 244 lb (110.7 kg) IBW/kg (Calculated) : 79.9  Temp (24hrs), Avg:97.6 F (36.4 C), Min:97.6 F (36.4 C), Max:97.6 F (36.4 C)   Recent Labs Lab 09/12/16 1107  WBC 6.0  CREATININE 3.00*    Estimated Creatinine Clearance: 29.5 mL/min (by C-G formula based on SCr of 3 mg/dL (H)).    No Known Allergies  Antimicrobials this admission: Levaquin 10/6 >>   Dose adjustments this admission:  No results found for this or any previous visit (from the past 240 hour(s)).  Thank you for allowing pharmacy to be a part of this patient's care.  Hart Robinsons A 09/12/2016 2:22 PM

## 2016-09-12 NOTE — ED Notes (Signed)
Attempted report x 2 
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
## 2016-09-12 NOTE — ED Notes (Signed)
Pt using urinal at this time.

## 2016-09-12 NOTE — ED Notes (Signed)
Attempted report x3. Charge nurse has not approved bed according to staff.
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
## 2016-09-12 NOTE — ED Notes (Signed)
CRITICAL VALUE ALERT  Critical value received: troponin 0.03  Date of notification:  09/12/16 Time of notification:  1156 Critical value read back:Yes.    Nurse who received alert: Dellis Anes MD notified (1st page):  Dr. Wilson Singer

## 2016-09-13 LAB — BASIC METABOLIC PANEL
Anion gap: 12 (ref 5–15)
BUN: 67 mg/dL — ABNORMAL HIGH (ref 6–20)
CALCIUM: 9.1 mg/dL (ref 8.9–10.3)
CO2: 28 mmol/L (ref 22–32)
CREATININE: 3.26 mg/dL — AB (ref 0.61–1.24)
Chloride: 94 mmol/L — ABNORMAL LOW (ref 101–111)
GFR, EST AFRICAN AMERICAN: 20 mL/min — AB (ref >60)
GFR, EST NON AFRICAN AMERICAN: 18 mL/min — AB (ref >60)
Glucose, Bld: 126 mg/dL — ABNORMAL HIGH (ref 65–99)
Potassium: 4.7 mmol/L (ref 3.5–5.1)
SODIUM: 134 mmol/L — AB (ref 135–145)

## 2016-09-13 LAB — MAGNESIUM: MAGNESIUM: 2.6 mg/dL — AB (ref 1.7–2.4)

## 2016-09-13 NOTE — Progress Notes (Signed)
Subjective: Patient was admitted yesterday due to shortness of breath and left side chest pain. Patient was started on nebulizer treatment, Iv steroid and Iv antibiotics. He feels better today.  Objective: Vital signs in last 24 hours: Temp:  [97.6 F (36.4 C)-98.5 F (36.9 C)] 98.3 F (36.8 C) (10/07 0600) Pulse Rate:  [78-94] 85 (10/07 0600) Resp:  [11-24] 18 (10/07 0600) BP: (121-144)/(37-64) 127/42 (10/07 0600) SpO2:  [93 %-100 %] 96 % (10/07 0815) Weight:  [110.7 kg (244 lb)] 110.7 kg (244 lb) (10/06 1546) Weight change:  Last BM Date: 09/11/16  Intake/Output from previous day: 10/06 0701 - 10/07 0700 In: 290 [P.O.:240; IV Piggyback:50] Out: 900 [Urine:900]  PHYSICAL EXAM General appearance: alert and no distress Resp: diminished breath sounds bilaterally and rhonchi bilaterally Cardio: S1, S2 normal GI: soft, non-tender; bowel sounds normal; no masses,  no organomegaly Extremities: 2++ leg edema  Lab Results:  Results for orders placed or performed during the hospital encounter of 09/12/16 (from the past 48 hour(s))  Brain natriuretic peptide     Status: Abnormal   Collection Time: 09/12/16 11:07 AM  Result Value Ref Range   B Natriuretic Peptide 298.0 (H) 0.0 - 100.0 pg/mL  CBC with Differential     Status: Abnormal   Collection Time: 09/12/16 11:07 AM  Result Value Ref Range   WBC 6.0 4.0 - 10.5 K/uL   RBC 3.87 (L) 4.22 - 5.81 MIL/uL   Hemoglobin 11.7 (L) 13.0 - 17.0 g/dL   HCT 35.3 (L) 39.0 - 52.0 %   MCV 91.2 78.0 - 100.0 fL   MCH 30.2 26.0 - 34.0 pg   MCHC 33.1 30.0 - 36.0 g/dL   RDW 14.2 11.5 - 15.5 %   Platelets 140 (L) 150 - 400 K/uL   Neutrophils Relative % 52 %   Neutro Abs 3.1 1.7 - 7.7 K/uL   Lymphocytes Relative 20 %   Lymphs Abs 1.2 0.7 - 4.0 K/uL   Monocytes Relative 11 %   Monocytes Absolute 0.6 0.1 - 1.0 K/uL   Eosinophils Relative 16 %   Eosinophils Absolute 0.9 (H) 0.0 - 0.7 K/uL   Basophils Relative 1 %   Basophils Absolute 0.1 0.0 -  0.1 K/uL  Comprehensive metabolic panel     Status: Abnormal   Collection Time: 09/12/16 11:07 AM  Result Value Ref Range   Sodium 135 135 - 145 mmol/L   Potassium 3.2 (L) 3.5 - 5.1 mmol/L   Chloride 94 (L) 101 - 111 mmol/L   CO2 31 22 - 32 mmol/L   Glucose, Bld 97 65 - 99 mg/dL   BUN 61 (H) 6 - 20 mg/dL   Creatinine, Ser 3.00 (H) 0.61 - 1.24 mg/dL   Calcium 9.5 8.9 - 10.3 mg/dL   Total Protein 7.8 6.5 - 8.1 g/dL   Albumin 3.8 3.5 - 5.0 g/dL   AST 22 15 - 41 U/L   ALT 17 17 - 63 U/L   Alkaline Phosphatase 46 38 - 126 U/L   Total Bilirubin 0.4 0.3 - 1.2 mg/dL   GFR calc non Af Amer 20 (L) >60 mL/min   GFR calc Af Amer 23 (L) >60 mL/min    Comment: (NOTE) The eGFR has been calculated using the CKD EPI equation. This calculation has not been validated in all clinical situations. eGFR's persistently <60 mL/min signify possible Chronic Kidney Disease.    Anion gap 10 5 - 15  Troponin I     Status: Abnormal  Collection Time: 09/12/16 11:07 AM  Result Value Ref Range   Troponin I 0.03 (HH) <0.03 ng/mL    Comment: CRITICAL RESULT CALLED TO, READ BACK BY AND VERIFIED WITH: MITCHELL,M AT 11:55AM ON 09/12/16 BY FESTERMAN,C   Urinalysis, Routine w reflex microscopic     Status: None   Collection Time: 09/12/16  1:13 PM  Result Value Ref Range   Color, Urine YELLOW YELLOW   APPearance CLEAR CLEAR   Specific Gravity, Urine 1.010 1.005 - 1.030   pH 8.0 5.0 - 8.0   Glucose, UA NEGATIVE NEGATIVE mg/dL   Hgb urine dipstick NEGATIVE NEGATIVE   Bilirubin Urine NEGATIVE NEGATIVE   Ketones, ur NEGATIVE NEGATIVE mg/dL   Protein, ur NEGATIVE NEGATIVE mg/dL   Nitrite NEGATIVE NEGATIVE   Leukocytes, UA NEGATIVE NEGATIVE    Comment: MICROSCOPIC NOT DONE ON URINES WITH NEGATIVE PROTEIN, BLOOD, LEUKOCYTES, NITRITE, OR GLUCOSE <1000 mg/dL.  Troponin I (q 6hr x 3)     Status: Abnormal   Collection Time: 09/12/16  5:06 PM  Result Value Ref Range   Troponin I 0.03 (HH) <0.03 ng/mL    Comment:  CRITICAL VALUE NOTED.  VALUE IS CONSISTENT WITH PREVIOUSLY REPORTED AND CALLED VALUE.  Troponin I (q 6hr x 3)     Status: Abnormal   Collection Time: 09/12/16 10:13 PM  Result Value Ref Range   Troponin I 0.03 (HH) <0.03 ng/mL    Comment: CRITICAL VALUE NOTED.  VALUE IS CONSISTENT WITH PREVIOUSLY REPORTED AND CALLED VALUE.  Magnesium     Status: Abnormal   Collection Time: 09/13/16  6:20 AM  Result Value Ref Range   Magnesium 2.6 (H) 1.7 - 2.4 mg/dL  Basic metabolic panel     Status: Abnormal   Collection Time: 09/13/16  6:20 AM  Result Value Ref Range   Sodium 134 (L) 135 - 145 mmol/L   Potassium 4.7 3.5 - 5.1 mmol/L    Comment: DELTA CHECK NOTED   Chloride 94 (L) 101 - 111 mmol/L   CO2 28 22 - 32 mmol/L   Glucose, Bld 126 (H) 65 - 99 mg/dL   BUN 67 (H) 6 - 20 mg/dL   Creatinine, Ser 3.26 (H) 0.61 - 1.24 mg/dL   Calcium 9.1 8.9 - 10.3 mg/dL   GFR calc non Af Amer 18 (L) >60 mL/min   GFR calc Af Amer 20 (L) >60 mL/min    Comment: (NOTE) The eGFR has been calculated using the CKD EPI equation. This calculation has not been validated in all clinical situations. eGFR's persistently <60 mL/min signify possible Chronic Kidney Disease.    Anion gap 12 5 - 15    ABGS No results for input(s): PHART, PO2ART, TCO2, HCO3 in the last 72 hours.  Invalid input(s): PCO2 CULTURES No results found for this or any previous visit (from the past 240 hour(s)). Studies/Results: Dg Chest 2 View  Result Date: 09/12/2016 CLINICAL DATA:  Shortness of breath. EXAM: CHEST  2 VIEW COMPARISON:  08/03/2016. FINDINGS: Images rotated. Lungs are clear. Stable cardiomegaly. No pleural effusion or pneumothorax. No acute bony abnormality. IMPRESSION: 1. Stable cardiomegaly. 2. No acute pulmonary disease. Electronically Signed   By: Marcello Moores  Register   On: 09/12/2016 12:23   Dg Shoulder Left Port  Result Date: 09/12/2016 CLINICAL DATA:  LEFT shoulder pain.  Fell 1 month ago. EXAM: LEFT SHOULDER - 1 VIEW  COMPARISON:  03/26/2016. FINDINGS: There is no evidence of fracture or dislocation. There is moderate degenerative spurring involving the humeral head  and narrowing of the glenohumeral joint. Soft tissues appear unremarkable. No calcifications. IMPRESSION: Degenerative joint disease, which may be slightly progressed from priors. No acute fracture is evident. Electronically Signed   By: Staci Righter M.D.   On: 09/12/2016 15:36    Medications: I have reviewed the patient's current medications.  Assesment:  Active Problems:   Sleep apnea   Hypothyroid   Panhypopituitarism (HCC)   Moderate aortic regurgitation   Seizure disorder (HCC)   Chronic combined systolic and diastolic CHF    Morbid obesity due to excess calories (HCC)   COPD with exacerbation (HCC)   CKD (chronic kidney disease) stage 4, GFR 15-29 ml/min (HCC)   Acute on chronic diastolic CHF (congestive heart failure) ([place])    Plan:  Medications reviewed Will continue iv steroid and and IV antibiotics Continue nebulizer treatment Continue regular treatment    LOS: 1 day   Jamelle Goldston 09/13/2016, 10:00 AM

## 2016-09-14 LAB — CBC
HEMATOCRIT: 34.4 % — AB (ref 39.0–52.0)
HEMOGLOBIN: 11.1 g/dL — AB (ref 13.0–17.0)
MCH: 29.8 pg (ref 26.0–34.0)
MCHC: 32.3 g/dL (ref 30.0–36.0)
MCV: 92.5 fL (ref 78.0–100.0)
Platelets: 153 10*3/uL (ref 150–400)
RBC: 3.72 MIL/uL — ABNORMAL LOW (ref 4.22–5.81)
RDW: 14.5 % (ref 11.5–15.5)
WBC: 9.1 10*3/uL (ref 4.0–10.5)

## 2016-09-14 LAB — BASIC METABOLIC PANEL
Anion gap: 10 (ref 5–15)
Anion gap: 11 (ref 5–15)
BUN: 73 mg/dL — AB (ref 6–20)
BUN: 77 mg/dL — ABNORMAL HIGH (ref 6–20)
CHLORIDE: 92 mmol/L — AB (ref 101–111)
CHLORIDE: 95 mmol/L — AB (ref 101–111)
CO2: 28 mmol/L (ref 22–32)
CO2: 28 mmol/L (ref 22–32)
CREATININE: 3.14 mg/dL — AB (ref 0.61–1.24)
CREATININE: 3.06 mg/dL — AB (ref 0.61–1.24)
Calcium: 9 mg/dL (ref 8.9–10.3)
Calcium: 9.4 mg/dL (ref 8.9–10.3)
GFR calc non Af Amer: 18 mL/min — ABNORMAL LOW (ref >60)
GFR calc non Af Amer: 19 mL/min — ABNORMAL LOW (ref >60)
GFR, EST AFRICAN AMERICAN: 21 mL/min — AB (ref >60)
GFR, EST AFRICAN AMERICAN: 22 mL/min — AB (ref >60)
Glucose, Bld: 166 mg/dL — ABNORMAL HIGH (ref 65–99)
Glucose, Bld: 84 mg/dL (ref 65–99)
POTASSIUM: 4.1 mmol/L (ref 3.5–5.1)
Potassium: 4.2 mmol/L (ref 3.5–5.1)
Sodium: 130 mmol/L — ABNORMAL LOW (ref 135–145)
Sodium: 134 mmol/L — ABNORMAL LOW (ref 135–145)

## 2016-09-14 MED ORDER — METHYLPREDNISOLONE SODIUM SUCC 40 MG IJ SOLR
40.0000 mg | Freq: Two times a day (BID) | INTRAMUSCULAR | Status: DC
  Administered 2016-09-14 – 2016-09-15 (×3): 40 mg via INTRAVENOUS
  Filled 2016-09-14 (×3): qty 1

## 2016-09-14 NOTE — Progress Notes (Signed)
Subjective: Patient improving. His breathing is improving. No new complaint  Objective: Vital signs in last 24 hours: Temp:  [97.8 F (36.6 C)-98.9 F (37.2 C)] 98.9 F (37.2 C) (10/08 0422) Pulse Rate:  [83-90] 90 (10/08 0422) Resp:  [16-19] 16 (10/08 0422) BP: (127-137)/(47-51) 137/51 (10/08 0422) SpO2:  [97 %-99 %] 98 % (10/08 0756) Weight:  [110.1 kg (242 lb 12.8 oz)] 110.1 kg (242 lb 12.8 oz) (10/08 0422) Weight change: -0.544 kg (-1 lb 3.2 oz) Last BM Date: 09/13/16  Intake/Output from previous day: 10/07 0701 - 10/08 0700 In: 720 [P.O.:720] Out: 1600 [Urine:1600]  PHYSICAL EXAM General appearance: alert and no distress Resp: diminished breath sounds bilaterally and rhonchi bilaterally Cardio: S1, S2 normal GI: soft, non-tender; bowel sounds normal; no masses,  no organomegaly Extremities: 2++ leg edema  Lab Results:  Results for orders placed or performed during the hospital encounter of 09/12/16 (from the past 48 hour(s))  Brain natriuretic peptide     Status: Abnormal   Collection Time: 09/12/16 11:07 AM  Result Value Ref Range   B Natriuretic Peptide 298.0 (H) 0.0 - 100.0 pg/mL  CBC with Differential     Status: Abnormal   Collection Time: 09/12/16 11:07 AM  Result Value Ref Range   WBC 6.0 4.0 - 10.5 K/uL   RBC 3.87 (L) 4.22 - 5.81 MIL/uL   Hemoglobin 11.7 (L) 13.0 - 17.0 g/dL   HCT 35.3 (L) 39.0 - 52.0 %   MCV 91.2 78.0 - 100.0 fL   MCH 30.2 26.0 - 34.0 pg   MCHC 33.1 30.0 - 36.0 g/dL   RDW 14.2 11.5 - 15.5 %   Platelets 140 (L) 150 - 400 K/uL   Neutrophils Relative % 52 %   Neutro Abs 3.1 1.7 - 7.7 K/uL   Lymphocytes Relative 20 %   Lymphs Abs 1.2 0.7 - 4.0 K/uL   Monocytes Relative 11 %   Monocytes Absolute 0.6 0.1 - 1.0 K/uL   Eosinophils Relative 16 %   Eosinophils Absolute 0.9 (H) 0.0 - 0.7 K/uL   Basophils Relative 1 %   Basophils Absolute 0.1 0.0 - 0.1 K/uL  Comprehensive metabolic panel     Status: Abnormal   Collection Time: 09/12/16  11:07 AM  Result Value Ref Range   Sodium 135 135 - 145 mmol/L   Potassium 3.2 (L) 3.5 - 5.1 mmol/L   Chloride 94 (L) 101 - 111 mmol/L   CO2 31 22 - 32 mmol/L   Glucose, Bld 97 65 - 99 mg/dL   BUN 61 (H) 6 - 20 mg/dL   Creatinine, Ser 3.00 (H) 0.61 - 1.24 mg/dL   Calcium 9.5 8.9 - 10.3 mg/dL   Total Protein 7.8 6.5 - 8.1 g/dL   Albumin 3.8 3.5 - 5.0 g/dL   AST 22 15 - 41 U/L   ALT 17 17 - 63 U/L   Alkaline Phosphatase 46 38 - 126 U/L   Total Bilirubin 0.4 0.3 - 1.2 mg/dL   GFR calc non Af Amer 20 (L) >60 mL/min   GFR calc Af Amer 23 (L) >60 mL/min    Comment: (NOTE) The eGFR has been calculated using the CKD EPI equation. This calculation has not been validated in all clinical situations. eGFR's persistently <60 mL/min signify possible Chronic Kidney Disease.    Anion gap 10 5 - 15  Troponin I     Status: Abnormal   Collection Time: 09/12/16 11:07 AM  Result Value Ref Range  Troponin I 0.03 (HH) <0.03 ng/mL    Comment: CRITICAL RESULT CALLED TO, READ BACK BY AND VERIFIED WITH: MITCHELL,M AT 11:55AM ON 09/12/16 BY FESTERMAN,C   Urinalysis, Routine w reflex microscopic     Status: None   Collection Time: 09/12/16  1:13 PM  Result Value Ref Range   Color, Urine YELLOW YELLOW   APPearance CLEAR CLEAR   Specific Gravity, Urine 1.010 1.005 - 1.030   pH 8.0 5.0 - 8.0   Glucose, UA NEGATIVE NEGATIVE mg/dL   Hgb urine dipstick NEGATIVE NEGATIVE   Bilirubin Urine NEGATIVE NEGATIVE   Ketones, ur NEGATIVE NEGATIVE mg/dL   Protein, ur NEGATIVE NEGATIVE mg/dL   Nitrite NEGATIVE NEGATIVE   Leukocytes, UA NEGATIVE NEGATIVE    Comment: MICROSCOPIC NOT DONE ON URINES WITH NEGATIVE PROTEIN, BLOOD, LEUKOCYTES, NITRITE, OR GLUCOSE <1000 mg/dL.  Troponin I (q 6hr x 3)     Status: Abnormal   Collection Time: 09/12/16  5:06 PM  Result Value Ref Range   Troponin I 0.03 (HH) <0.03 ng/mL    Comment: CRITICAL VALUE NOTED.  VALUE IS CONSISTENT WITH PREVIOUSLY REPORTED AND CALLED VALUE.   Troponin I (q 6hr x 3)     Status: Abnormal   Collection Time: 09/12/16 10:13 PM  Result Value Ref Range   Troponin I 0.03 (HH) <0.03 ng/mL    Comment: CRITICAL VALUE NOTED.  VALUE IS CONSISTENT WITH PREVIOUSLY REPORTED AND CALLED VALUE.  Magnesium     Status: Abnormal   Collection Time: 09/13/16  6:20 AM  Result Value Ref Range   Magnesium 2.6 (H) 1.7 - 2.4 mg/dL  Basic metabolic panel     Status: Abnormal   Collection Time: 09/13/16  6:20 AM  Result Value Ref Range   Sodium 134 (L) 135 - 145 mmol/L   Potassium 4.7 3.5 - 5.1 mmol/L    Comment: DELTA CHECK NOTED   Chloride 94 (L) 101 - 111 mmol/L   CO2 28 22 - 32 mmol/L   Glucose, Bld 126 (H) 65 - 99 mg/dL   BUN 67 (H) 6 - 20 mg/dL   Creatinine, Ser 3.26 (H) 0.61 - 1.24 mg/dL   Calcium 9.1 8.9 - 10.3 mg/dL   GFR calc non Af Amer 18 (L) >60 mL/min   GFR calc Af Amer 20 (L) >60 mL/min    Comment: (NOTE) The eGFR has been calculated using the CKD EPI equation. This calculation has not been validated in all clinical situations. eGFR's persistently <60 mL/min signify possible Chronic Kidney Disease.    Anion gap 12 5 - 15  CBC     Status: Abnormal   Collection Time: 09/14/16  2:06 AM  Result Value Ref Range   WBC 9.1 4.0 - 10.5 K/uL   RBC 3.72 (L) 4.22 - 5.81 MIL/uL   Hemoglobin 11.1 (L) 13.0 - 17.0 g/dL   HCT 34.4 (L) 39.0 - 52.0 %   MCV 92.5 78.0 - 100.0 fL   MCH 29.8 26.0 - 34.0 pg   MCHC 32.3 30.0 - 36.0 g/dL   RDW 14.5 11.5 - 15.5 %   Platelets 153 150 - 400 K/uL  Basic metabolic panel     Status: Abnormal   Collection Time: 09/14/16  2:06 AM  Result Value Ref Range   Sodium 130 (L) 135 - 145 mmol/L   Potassium 4.2 3.5 - 5.1 mmol/L   Chloride 92 (L) 101 - 111 mmol/L   CO2 28 22 - 32 mmol/L   Glucose, Bld 166 (H)  65 - 99 mg/dL   BUN 73 (H) 6 - 20 mg/dL   Creatinine, Ser 3.14 (H) 0.61 - 1.24 mg/dL   Calcium 9.0 8.9 - 10.3 mg/dL   GFR calc non Af Amer 18 (L) >60 mL/min   GFR calc Af Amer 21 (L) >60 mL/min     Comment: (NOTE) The eGFR has been calculated using the CKD EPI equation. This calculation has not been validated in all clinical situations. eGFR's persistently <60 mL/min signify possible Chronic Kidney Disease.    Anion gap 10 5 - 15  Basic metabolic panel     Status: Abnormal   Collection Time: 09/14/16  6:52 AM  Result Value Ref Range   Sodium 134 (L) 135 - 145 mmol/L   Potassium 4.1 3.5 - 5.1 mmol/L   Chloride 95 (L) 101 - 111 mmol/L   CO2 28 22 - 32 mmol/L   Glucose, Bld 84 65 - 99 mg/dL   BUN 77 (H) 6 - 20 mg/dL   Creatinine, Ser 3.06 (H) 0.61 - 1.24 mg/dL   Calcium 9.4 8.9 - 10.3 mg/dL   GFR calc non Af Amer 19 (L) >60 mL/min   GFR calc Af Amer 22 (L) >60 mL/min    Comment: (NOTE) The eGFR has been calculated using the CKD EPI equation. This calculation has not been validated in all clinical situations. eGFR's persistently <60 mL/min signify possible Chronic Kidney Disease.    Anion gap 11 5 - 15    ABGS No results for input(s): PHART, PO2ART, TCO2, HCO3 in the last 72 hours.  Invalid input(s): PCO2 CULTURES No results found for this or any previous visit (from the past 240 hour(s)). Studies/Results: Dg Chest 2 View  Result Date: 09/12/2016 CLINICAL DATA:  Shortness of breath. EXAM: CHEST  2 VIEW COMPARISON:  08/03/2016. FINDINGS: Images rotated. Lungs are clear. Stable cardiomegaly. No pleural effusion or pneumothorax. No acute bony abnormality. IMPRESSION: 1. Stable cardiomegaly. 2. No acute pulmonary disease. Electronically Signed   By: Marcello Moores  Register   On: 09/12/2016 12:23   Dg Shoulder Left Port  Result Date: 09/12/2016 CLINICAL DATA:  LEFT shoulder pain.  Fell 1 month ago. EXAM: LEFT SHOULDER - 1 VIEW COMPARISON:  03/26/2016. FINDINGS: There is no evidence of fracture or dislocation. There is moderate degenerative spurring involving the humeral head and narrowing of the glenohumeral joint. Soft tissues appear unremarkable. No calcifications. IMPRESSION:  Degenerative joint disease, which may be slightly progressed from priors. No acute fracture is evident. Electronically Signed   By: Staci Righter M.D.   On: 09/12/2016 15:36    Medications: I have reviewed the patient's current medications.  Assesment:  Active Problems:   Sleep apnea   Hypothyroid   Panhypopituitarism (HCC)   Moderate aortic regurgitation   Seizure disorder (HCC)   Chronic combined systolic and diastolic CHF    Morbid obesity due to excess calories (HCC)   COPD with exacerbation (HCC)   CKD (chronic kidney disease) stage 4, GFR 15-29 ml/min (HCC)   Acute on chronic diastolic CHF (congestive heart failure) ([place])    Plan:  Medications reviewed Will taper IV steroid Continue nebulizer treatment Continue regular treatment    LOS: 2 days   Nellie Pester 09/14/2016, 9:25 AM

## 2016-09-15 LAB — BASIC METABOLIC PANEL
ANION GAP: 10 (ref 5–15)
BUN: 81 mg/dL — ABNORMAL HIGH (ref 6–20)
CALCIUM: 8.9 mg/dL (ref 8.9–10.3)
CHLORIDE: 94 mmol/L — AB (ref 101–111)
CO2: 28 mmol/L (ref 22–32)
CREATININE: 2.83 mg/dL — AB (ref 0.61–1.24)
GFR calc non Af Amer: 21 mL/min — ABNORMAL LOW (ref >60)
GFR, EST AFRICAN AMERICAN: 24 mL/min — AB (ref >60)
Glucose, Bld: 150 mg/dL — ABNORMAL HIGH (ref 65–99)
Potassium: 3.7 mmol/L (ref 3.5–5.1)
SODIUM: 132 mmol/L — AB (ref 135–145)

## 2016-09-15 MED ORDER — LEVOFLOXACIN 750 MG PO TABS: 750.0000 mg | ORAL_TABLET | ORAL | 0 refills | Status: DC

## 2016-09-15 MED ORDER — PREDNISONE 10 MG (21) PO TBPK: 10.0000 mg | ORAL_TABLET | Freq: Every day | ORAL | 0 refills | Status: DC

## 2016-09-15 NOTE — Progress Notes (Signed)
Pt a/o.vss. Saline lock removed. Discharge instructions given. Prescriptions given. Pt verbalized understanding o of instructions. Pt left floor via wheelchair with nursing staff and family.

## 2016-09-15 NOTE — Discharge Summary (Signed)
Physician Discharge Summary  Patient ID: [name] [name] MRN: [id number] DOB/AGE: [birth year] 70 y.o. Primary Care Physician:Nahia Nissan, MD Admit date: 09/12/2016 Discharge date: 09/15/2016    Discharge Diagnoses:   Active Problems:   Sleep apnea   Hypothyroid   Panhypopituitarism (HCC)   Moderate aortic regurgitation   Seizure disorder (HCC)   Chronic combined systolic and diastolic CHF    Morbid obesity due to excess calories (HCC)   COPD with exacerbation (HCC)   CKD (chronic kidney disease) stage 4, GFR 15-29 ml/min (HCC)   Acute on chronic diastolic CHF (congestive heart failure) ([place])     Medication List    TAKE these medications   acetaminophen 325 MG tablet Commonly known as:  TYLENOL Take 2 tablets (650 mg total) by mouth every 6 (six) hours as needed for mild pain, fever or headache.   ADVAIR DISKUS 250-50 MCG/DOSE Aepb Generic drug:  Fluticasone-Salmeterol Inhale 1 puff into the lungs 2 (two) times daily.   albuterol 108 (90 Base) MCG/ACT inhaler Commonly known as:  PROVENTIL HFA;VENTOLIN HFA Inhale 2 puffs into the lungs every 4 (four) hours as needed for wheezing or shortness of breath.   albuterol (2.5 MG/3ML) 0.083% nebulizer solution Commonly known as:  PROVENTIL Inhale 3 mLs into the lungs every 6 (six) hours as needed. Shortness of breath/wheezing   BIOFREEZE 4 % Gel Generic drug:  Menthol (Topical Analgesic) Apply to back of neck as needed for neck pain   calcitRIOL 0.5 MCG capsule Commonly known as:  ROCALTROL Take 0.5 mcg by mouth 2 (two) times daily.   carvedilol 3.125 MG tablet Commonly known as:  COREG Take 1 tablet (3.125 mg total) by mouth 2 (two) times daily with a meal.   desmopressin 0.2 MG tablet Commonly known as:  DDAVP Take 1 tablet (0.2 mg total) by mouth 2 (two) times daily.   ENSURE ENLIVE PO Take 1 Can by mouth 2 (two) times daily. Give once a day with meals   fluticasone 50 MCG/ACT nasal spray Commonly known as:   FLONASE Place 2 sprays into both nostrils daily as needed for allergies.   HYDROcodone-acetaminophen 5-325 MG tablet Commonly known as:  NORCO/VICODIN Take one tablet by mouth every 6 hours as needed for moderate pain. DNE 3gm APAP/24h from all sources   isosorbide-hydrALAZINE 20-37.5 MG tablet Commonly known as:  BIDIL Take 1 tablet by mouth 3 (three) times daily.   KP FERROUS SULFATE 325 (65 FE) MG tablet Generic drug:  ferrous sulfate Take 325 mg by mouth 2 (two) times daily with a meal.   levofloxacin 750 MG tablet Commonly known as:  LEVAQUIN Take 1 tablet (750 mg total) by mouth every other day. Start taking on:  09/16/2016   levothyroxine 150 MCG tablet Commonly known as:  SYNTHROID, LEVOTHROID Take 150 mcg by mouth daily before breakfast.   Melatonin 3 MG Tabs Give 3 mg by mouth every evening as needed for insomnia   MILK OF MAGNESIA PO Give 30 ml by mouth as needed for constipation, may try fleets enema   ondansetron 4 MG tablet Commonly known as:  ZOFRAN Take 1 tablet (4 mg total) by mouth every 6 (six) hours as needed for nausea.   pantoprazole 40 MG tablet Commonly known as:  PROTONIX Take 1 tablet (40 mg total) by mouth 2 (two) times daily before a meal.   phenytoin 100 MG ER capsule Commonly known as:  DILANTIN Takes 200 mg on Monday and Thursdsay. On all other days of the  week takes 300 mg.   polyethylene glycol powder powder Commonly known as:  GLYCOLAX/MIRALAX Take 17 g by mouth daily. May decrease to 17 g three times a week if needed.   potassium chloride SA 20 MEQ tablet Commonly known as:  K-DUR,KLOR-CON Take 20 mEq by mouth 2 (two) times daily.   predniSONE 10 MG (21) Tbpk tablet Commonly known as:  STERAPRED UNI-PAK 21 TAB Take 1 tablet (10 mg total) by mouth daily. 4 tab po daily for 3 days, 3 tab po daily for 3 days, 2 tab po daily for 3 days, 1 tab po  Daily for 3 days.   simethicone 80 MG chewable tablet Commonly known as:   MYLICON Chew 0000000 mg by mouth 4 (four) times daily as needed for flatulence.   simvastatin 20 MG tablet Commonly known as:  ZOCOR Take 20 mg by mouth daily.   tiotropium 18 MCG inhalation capsule Commonly known as:  SPIRIVA Place 18 mcg into inhaler and inhale daily.   torsemide 20 MG tablet Commonly known as:  DEMADEX Take 1 tablet (20 mg total) by mouth 2 (two) times daily.   Vitamin D (Ergocalciferol) 50000 units Caps capsule Commonly known as:  DRISDOL Take 50,000 Units by mouth every 7 (seven) days.       Discharged Condition: improved    Consults: none  Significant Diagnostic Studies: Dg Chest 2 View  Result Date: 09/12/2016 CLINICAL DATA:  Shortness of breath. EXAM: CHEST  2 VIEW COMPARISON:  08/03/2016. FINDINGS: Images rotated. Lungs are clear. Stable cardiomegaly. No pleural effusion or pneumothorax. No acute bony abnormality. IMPRESSION: 1. Stable cardiomegaly. 2. No acute pulmonary disease. Electronically Signed   By: Marcello Moores  Register   On: 09/12/2016 12:23   Dg Shoulder Left Port  Result Date: 09/12/2016 CLINICAL DATA:  LEFT shoulder pain.  Fell 1 month ago. EXAM: LEFT SHOULDER - 1 VIEW COMPARISON:  03/26/2016. FINDINGS: There is no evidence of fracture or dislocation. There is moderate degenerative spurring involving the humeral head and narrowing of the glenohumeral joint. Soft tissues appear unremarkable. No calcifications. IMPRESSION: Degenerative joint disease, which may be slightly progressed from priors. No acute fracture is evident. Electronically Signed   By: Staci Righter M.D.   On: 09/12/2016 15:36    Lab Results: Basic Metabolic Panel:  Recent Labs  09/13/16 0620  09/14/16 0652 09/15/16 0633  NA 134*  < > 134* 132*  K 4.7  < > 4.1 3.7  CL 94*  < > 95* 94*  CO2 28  < > 28 28  GLUCOSE 126*  < > 84 150*  BUN 67*  < > 77* 81*  CREATININE 3.26*  < > 3.06* 2.83*  CALCIUM 9.1  < > 9.4 8.9  MG 2.6*  --   --   --   < > = values in this interval  not displayed. Liver Function Tests:  Recent Labs  09/12/16 1107  AST 22  ALT 17  ALKPHOS 46  BILITOT 0.4  PROT 7.8  ALBUMIN 3.8     CBC:  Recent Labs  09/12/16 1107 09/14/16 0206  WBC 6.0 9.1  NEUTROABS 3.1  --   HGB 11.7* 11.1*  HCT 35.3* 34.4*  MCV 91.2 92.5  PLT 140* 153    No results found for this or any previous visit (from the past 240 hour(s)).   Hospital Course:   This is a 70 years old male patient with history of multiple medical illnesses who was admitted due to  worsening shortness of breath and chest tightness. [name] a known case of COPD and CHf. He was treated with IV steroid, antibiotics and nebulizer. He also received diuretics. Over the hospital stay patient improved and he is back to his baseline. He will be discharged in stable condition to continue his regular treatment.  Discharge Exam: Blood pressure (!) 135/40, pulse 81, temperature 98.4 F (36.9 C), temperature source Oral, resp. rate 18, height 6' 0.99" (1.854 m), weight 110.3 kg (243 lb 1.8 oz), SpO2 96 %.   Disposition:  Home    Follow-up Information    Maurion Walkowiak, MD Follow up in 1 week(s).   Specialty:  Internal Medicine Contact information: [place]  [id number] [phone]           Signed: [name]   09/15/2016, 8:25 AM

## 2016-09-25 ENCOUNTER — Encounter (HOSPITAL_COMMUNITY)
Admission: RE | Admit: 2016-09-25 | Discharge: 2016-09-25 | Disposition: A | Discharge status: Home / Self Care | Payer: Medicare HMO | Source: Skilled Nursing Facility | Attending: Internal Medicine | Admitting: Internal Medicine

## 2016-09-25 DIAGNOSIS — M6281 Muscle weakness (generalized): Secondary | ICD-10-CM | POA: Insufficient documentation

## 2016-09-25 DIAGNOSIS — E039 Hypothyroidism, unspecified: Secondary | ICD-10-CM | POA: Insufficient documentation

## 2016-09-25 DIAGNOSIS — N184 Chronic kidney disease, stage 4 (severe): Secondary | ICD-10-CM | POA: Insufficient documentation

## 2016-09-25 DIAGNOSIS — J449 Chronic obstructive pulmonary disease, unspecified: Secondary | ICD-10-CM | POA: Insufficient documentation

## 2016-09-25 DIAGNOSIS — I5042 Chronic combined systolic (congestive) and diastolic (congestive) heart failure: Secondary | ICD-10-CM | POA: Insufficient documentation

## 2016-09-30 ENCOUNTER — Encounter: Payer: Self-pay | Admitting: "Endocrinology

## 2016-09-30 ENCOUNTER — Ambulatory Visit (INDEPENDENT_AMBULATORY_CARE_PROVIDER_SITE_OTHER): Payer: Medicare HMO | Admitting: "Endocrinology

## 2016-09-30 DIAGNOSIS — E038 Other specified hypothyroidism: Secondary | ICD-10-CM | POA: Diagnosis not present

## 2016-09-30 DIAGNOSIS — E559 Vitamin D deficiency, unspecified: Secondary | ICD-10-CM

## 2016-09-30 DIAGNOSIS — N184 Chronic kidney disease, stage 4 (severe): Secondary | ICD-10-CM | POA: Diagnosis not present

## 2016-09-30 DIAGNOSIS — E1122 Type 2 diabetes mellitus with diabetic chronic kidney disease: Secondary | ICD-10-CM | POA: Diagnosis not present

## 2016-09-30 DIAGNOSIS — E230 Hypopituitarism: Secondary | ICD-10-CM

## 2016-09-30 MED ORDER — LEVOTHYROXINE SODIUM 150 MCG PO TABS: 150.0000 ug | ORAL_TABLET | Freq: Every day | ORAL | 12 refills | Status: DC

## 2016-09-30 MED ORDER — DESMOPRESSIN ACETATE 0.1 MG PO TABS: 0.1000 mg | ORAL_TABLET | Freq: Two times a day (BID) | ORAL | 12 refills | Status: DC

## 2016-09-30 MED ORDER — DESMOPRESSIN ACETATE 0.2 MG PO TABS: 0.2000 mg | ORAL_TABLET | Freq: Two times a day (BID) | ORAL | 12 refills | Status: AC

## 2016-09-30 MED ORDER — DEXAMETHASONE 1.5 MG (27) PO TBPK: 1.5000 mg | ORAL_TABLET | Freq: Every day | ORAL | 12 refills | Status: DC

## 2016-09-30 MED ORDER — VITAMIN D3 125 MCG (5000 UT) PO CAPS: 5000.0000 [IU] | ORAL_CAPSULE | Freq: Every day | ORAL | 0 refills | Status: DC
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
## 2016-10-29 ENCOUNTER — Other Ambulatory Visit: Payer: Self-pay | Admitting: "Endocrinology

## 2016-10-29 LAB — HEMOGLOBIN A1C
Hgb A1c MFr Bld: 5.4 % (ref <5.7)
Mean Plasma Glucose: 108 mg/dL

## 2016-10-30 LAB — COMPREHENSIVE METABOLIC PANEL
ALT: 28 U/L (ref 9–46)
AST: 21 U/L (ref 10–35)
Albumin: 4 g/dL (ref 3.6–5.1)
Alkaline Phosphatase: 34 U/L — ABNORMAL LOW (ref 40–115)
BILIRUBIN TOTAL: 0.4 mg/dL (ref 0.2–1.2)
BUN: 61 mg/dL — ABNORMAL HIGH (ref 7–25)
CO2: 20 mmol/L (ref 20–31)
Calcium: 8.8 mg/dL (ref 8.6–10.3)
Chloride: 111 mmol/L — ABNORMAL HIGH (ref 98–110)
Creat: 2.14 mg/dL — ABNORMAL HIGH (ref 0.70–1.18)
GLUCOSE: 117 mg/dL — AB (ref 65–99)
POTASSIUM: 4.4 mmol/L (ref 3.5–5.3)
Sodium: 141 mmol/L (ref 135–146)
Total Protein: 7.2 g/dL (ref 6.1–8.1)

## 2016-11-03 ENCOUNTER — Emergency Department (HOSPITAL_COMMUNITY): Payer: Medicare HMO

## 2016-11-03 ENCOUNTER — Encounter (HOSPITAL_COMMUNITY): Payer: Self-pay | Admitting: Emergency Medicine

## 2016-11-03 ENCOUNTER — Inpatient Hospital Stay (HOSPITAL_COMMUNITY)
Admission: EM | Admit: 2016-11-03 | Discharge: 2016-11-07 | DRG: 191 | Disposition: A | Discharge status: Home / Self Care | Payer: Medicare HMO | Attending: Internal Medicine | Admitting: Internal Medicine

## 2016-11-03 DIAGNOSIS — E119 Type 2 diabetes mellitus without complications: Secondary | ICD-10-CM

## 2016-11-03 DIAGNOSIS — Z905 Acquired absence of kidney: Secondary | ICD-10-CM | POA: Diagnosis not present

## 2016-11-03 DIAGNOSIS — E039 Hypothyroidism, unspecified: Secondary | ICD-10-CM | POA: Diagnosis present

## 2016-11-03 DIAGNOSIS — R0600 Dyspnea, unspecified: Secondary | ICD-10-CM

## 2016-11-03 DIAGNOSIS — I5022 Chronic systolic (congestive) heart failure: Secondary | ICD-10-CM | POA: Diagnosis present

## 2016-11-03 DIAGNOSIS — E1122 Type 2 diabetes mellitus with diabetic chronic kidney disease: Secondary | ICD-10-CM

## 2016-11-03 DIAGNOSIS — N184 Chronic kidney disease, stage 4 (severe): Secondary | ICD-10-CM

## 2016-11-03 DIAGNOSIS — Z7952 Long term (current) use of systemic steroids: Secondary | ICD-10-CM

## 2016-11-03 DIAGNOSIS — Z87891 Personal history of nicotine dependence: Secondary | ICD-10-CM

## 2016-11-03 DIAGNOSIS — Z6833 Body mass index (BMI) 33.0-33.9, adult: Secondary | ICD-10-CM | POA: Diagnosis not present

## 2016-11-03 DIAGNOSIS — E893 Postprocedural hypopituitarism: Secondary | ICD-10-CM | POA: Diagnosis present

## 2016-11-03 DIAGNOSIS — E232 Diabetes insipidus: Secondary | ICD-10-CM

## 2016-11-03 DIAGNOSIS — R0902 Hypoxemia: Secondary | ICD-10-CM | POA: Diagnosis present

## 2016-11-03 DIAGNOSIS — Z9981 Dependence on supplemental oxygen: Secondary | ICD-10-CM

## 2016-11-03 DIAGNOSIS — J9611 Chronic respiratory failure with hypoxia: Secondary | ICD-10-CM | POA: Diagnosis present

## 2016-11-03 DIAGNOSIS — I351 Nonrheumatic aortic (valve) insufficiency: Secondary | ICD-10-CM | POA: Diagnosis present

## 2016-11-03 DIAGNOSIS — M25461 Effusion, right knee: Secondary | ICD-10-CM

## 2016-11-03 DIAGNOSIS — E230 Hypopituitarism: Secondary | ICD-10-CM

## 2016-11-03 DIAGNOSIS — I10 Essential (primary) hypertension: Secondary | ICD-10-CM

## 2016-11-03 DIAGNOSIS — G40909 Epilepsy, unspecified, not intractable, without status epilepticus: Secondary | ICD-10-CM | POA: Diagnosis present

## 2016-11-03 DIAGNOSIS — Z85528 Personal history of other malignant neoplasm of kidney: Secondary | ICD-10-CM

## 2016-11-03 DIAGNOSIS — Z8249 Family history of ischemic heart disease and other diseases of the circulatory system: Secondary | ICD-10-CM | POA: Diagnosis not present

## 2016-11-03 DIAGNOSIS — E669 Obesity, unspecified: Secondary | ICD-10-CM | POA: Diagnosis present

## 2016-11-03 DIAGNOSIS — J441 Chronic obstructive pulmonary disease with (acute) exacerbation: Principal | ICD-10-CM

## 2016-11-03 DIAGNOSIS — I130 Hypertensive heart and chronic kidney disease with heart failure and stage 1 through stage 4 chronic kidney disease, or unspecified chronic kidney disease: Secondary | ICD-10-CM | POA: Diagnosis present

## 2016-11-03 DIAGNOSIS — M109 Gout, unspecified: Secondary | ICD-10-CM | POA: Diagnosis present

## 2016-11-03 LAB — URIC ACID: Uric Acid, Serum: 8.3 mg/dL — ABNORMAL HIGH (ref 4.4–7.6)

## 2016-11-03 LAB — COMPREHENSIVE METABOLIC PANEL
ALK PHOS: 37 U/L — AB (ref 38–126)
ALT: 24 U/L (ref 17–63)
AST: 20 U/L (ref 15–41)
Albumin: 4 g/dL (ref 3.5–5.0)
Anion gap: 8 (ref 5–15)
BUN: 47 mg/dL — AB (ref 6–20)
CALCIUM: 9.4 mg/dL (ref 8.9–10.3)
CO2: 23 mmol/L (ref 22–32)
CREATININE: 2.05 mg/dL — AB (ref 0.61–1.24)
Chloride: 105 mmol/L (ref 101–111)
GFR, EST AFRICAN AMERICAN: 36 mL/min — AB (ref >60)
GFR, EST NON AFRICAN AMERICAN: 31 mL/min — AB (ref >60)
Glucose, Bld: 96 mg/dL (ref 65–99)
Potassium: 3.9 mmol/L (ref 3.5–5.1)
SODIUM: 136 mmol/L (ref 135–145)
Total Bilirubin: 0.5 mg/dL (ref 0.3–1.2)
Total Protein: 8.1 g/dL (ref 6.5–8.1)

## 2016-11-03 LAB — CBC WITH DIFFERENTIAL/PLATELET
BASOS ABS: 0 10*3/uL (ref 0.0–0.1)
Basophils Relative: 0 %
Eosinophils Absolute: 0.6 10*3/uL (ref 0.0–0.7)
Eosinophils Relative: 7 %
HCT: 37.7 % — ABNORMAL LOW (ref 39.0–52.0)
HEMOGLOBIN: 12.3 g/dL — AB (ref 13.0–17.0)
LYMPHS ABS: 0.8 10*3/uL (ref 0.7–4.0)
LYMPHS PCT: 8 %
MCH: 31 pg (ref 26.0–34.0)
MCHC: 32.6 g/dL (ref 30.0–36.0)
MCV: 95 fL (ref 78.0–100.0)
Monocytes Absolute: 1.2 10*3/uL — ABNORMAL HIGH (ref 0.1–1.0)
Monocytes Relative: 13 %
NEUTROS ABS: 6.5 10*3/uL (ref 1.7–7.7)
NEUTROS PCT: 72 %
Platelets: 133 10*3/uL — ABNORMAL LOW (ref 150–400)
RBC: 3.97 MIL/uL — AB (ref 4.22–5.81)
RDW: 15.4 % (ref 11.5–15.5)
WBC: 9.1 10*3/uL (ref 4.0–10.5)

## 2016-11-03 LAB — SYNOVIAL CELL COUNT + DIFF, W/ CRYSTALS
EOSINOPHILS-SYNOVIAL: 0 % (ref 0–1)
Lymphocytes-Synovial Fld: 6 % (ref 0–20)
Monocyte-Macrophage-Synovial Fluid: 0 % — ABNORMAL LOW (ref 50–90)
Neutrophil, Synovial: 94 % — ABNORMAL HIGH (ref 0–25)
WBC, Synovial: 7260 /mm3 — ABNORMAL HIGH (ref 0–200)

## 2016-11-03 LAB — GLUCOSE, CAPILLARY: GLUCOSE-CAPILLARY: 100 mg/dL — AB (ref 65–99)

## 2016-11-03 LAB — GRAM STAIN

## 2016-11-03 LAB — BRAIN NATRIURETIC PEPTIDE: B Natriuretic Peptide: 305 pg/mL — ABNORMAL HIGH (ref 0.0–100.0)

## 2016-11-03 LAB — TROPONIN I: TROPONIN I: 0.06 ng/mL — AB (ref <0.03)

## 2016-11-03 LAB — I-STAT CG4 LACTIC ACID, ED: LACTIC ACID, VENOUS: 1.25 mmol/L (ref 0.5–1.9)

## 2016-11-03 MED ORDER — METHYLPREDNISOLONE SODIUM SUCC 125 MG IJ SOLR
60.0000 mg | Freq: Two times a day (BID) | INTRAMUSCULAR | Status: DC
  Administered 2016-11-03 – 2016-11-06 (×6): 60 mg via INTRAVENOUS
  Filled 2016-11-03 (×6): qty 2

## 2016-11-03 MED ORDER — IPRATROPIUM-ALBUTEROL 0.5-2.5 (3) MG/3ML IN SOLN
3.0000 mL | Freq: Once | RESPIRATORY_TRACT | Status: AC
  Administered 2016-11-03: 3 mL via RESPIRATORY_TRACT
  Filled 2016-11-03: qty 3

## 2016-11-03 MED ORDER — ACETAMINOPHEN 500 MG PO TABS
1000.0000 mg | ORAL_TABLET | Freq: Once | ORAL | Status: AC
  Administered 2016-11-03: 1000 mg via ORAL
  Filled 2016-11-03: qty 2

## 2016-11-03 MED ORDER — ENOXAPARIN SODIUM 40 MG/0.4ML ~~LOC~~ SOLN
40.0000 mg | SUBCUTANEOUS | Status: DC
  Administered 2016-11-03 – 2016-11-06 (×4): 40 mg via SUBCUTANEOUS
  Filled 2016-11-03 (×4): qty 0.4

## 2016-11-03 MED ORDER — CLINDAMYCIN PHOSPHATE 600 MG/50ML IV SOLN
INTRAVENOUS | Status: AC
  Filled 2016-11-03: qty 100

## 2016-11-03 MED ORDER — CLINDAMYCIN PHOSPHATE 600 MG/50ML IV SOLN
600.0000 mg | Freq: Three times a day (TID) | INTRAVENOUS | Status: DC
  Administered 2016-11-03 – 2016-11-07 (×12): 600 mg via INTRAVENOUS
  Filled 2016-11-03 (×22): qty 50

## 2016-11-03 MED ORDER — METHYLPREDNISOLONE SODIUM SUCC 125 MG IJ SOLR: 80.0000 mg | Freq: Once | INTRAMUSCULAR | Status: DC

## 2016-11-03 MED ORDER — INSULIN ASPART 100 UNIT/ML ~~LOC~~ SOLN: 0.0000 [IU] | Freq: Every day | SUBCUTANEOUS | Status: DC

## 2016-11-03 MED ORDER — AMLODIPINE BESYLATE 5 MG PO TABS
5.0000 mg | ORAL_TABLET | Freq: Every day | ORAL | Status: DC
  Administered 2016-11-03 – 2016-11-07 (×5): 5 mg via ORAL
  Filled 2016-11-03 (×5): qty 1

## 2016-11-03 MED ORDER — ALBUTEROL SULFATE (2.5 MG/3ML) 0.083% IN NEBU: 5.0000 mg | INHALATION_SOLUTION | Freq: Once | RESPIRATORY_TRACT | Status: DC

## 2016-11-03 MED ORDER — ACETAMINOPHEN 325 MG PO TABS: 650.0000 mg | ORAL_TABLET | Freq: Four times a day (QID) | ORAL | Status: DC | PRN

## 2016-11-03 MED ORDER — ONDANSETRON HCL 4 MG PO TABS: 4.0000 mg | ORAL_TABLET | Freq: Four times a day (QID) | ORAL | Status: DC | PRN

## 2016-11-03 MED ORDER — METOLAZONE 5 MG PO TABS
5.0000 mg | ORAL_TABLET | Freq: Every day | ORAL | Status: DC
  Administered 2016-11-03 – 2016-11-07 (×5): 5 mg via ORAL
  Filled 2016-11-03 (×5): qty 1

## 2016-11-03 MED ORDER — CALCITRIOL 0.25 MCG PO CAPS
0.5000 ug | ORAL_CAPSULE | Freq: Every day | ORAL | Status: DC
  Administered 2016-11-03 – 2016-11-07 (×5): 0.5 ug via ORAL
  Filled 2016-11-03 (×5): qty 2

## 2016-11-03 MED ORDER — ALBUTEROL SULFATE (2.5 MG/3ML) 0.083% IN NEBU
2.5000 mg | INHALATION_SOLUTION | Freq: Once | RESPIRATORY_TRACT | Status: AC
Start: 2016-11-03 — End: 2016-11-03
  Administered 2016-11-03: 2.5 mg via RESPIRATORY_TRACT
  Filled 2016-11-03: qty 3

## 2016-11-03 MED ORDER — LEVOTHYROXINE SODIUM 75 MCG PO TABS
150.0000 ug | ORAL_TABLET | Freq: Every day | ORAL | Status: DC
  Administered 2016-11-04 – 2016-11-07 (×4): 150 ug via ORAL
  Filled 2016-11-03 (×4): qty 2

## 2016-11-03 MED ORDER — LIDOCAINE HCL (PF) 1 % IJ SOLN
5.0000 mL | Freq: Once | INTRAMUSCULAR | Status: AC
  Administered 2016-11-03: 5 mL via INTRADERMAL
  Filled 2016-11-03: qty 5

## 2016-11-03 MED ORDER — POTASSIUM CHLORIDE CRYS ER 20 MEQ PO TBCR
20.0000 meq | EXTENDED_RELEASE_TABLET | Freq: Every day | ORAL | Status: DC
  Administered 2016-11-03: 20 meq via ORAL
  Filled 2016-11-03 (×2): qty 1

## 2016-11-03 MED ORDER — HYDROCODONE-ACETAMINOPHEN 5-325 MG PO TABS
1.0000 | ORAL_TABLET | Freq: Four times a day (QID) | ORAL | Status: DC | PRN
  Administered 2016-11-03 – 2016-11-06 (×5): 1 via ORAL
  Filled 2016-11-03 (×5): qty 1

## 2016-11-03 MED ORDER — ACETAMINOPHEN 650 MG RE SUPP: 650.0000 mg | Freq: Four times a day (QID) | RECTAL | Status: DC | PRN

## 2016-11-03 MED ORDER — TORSEMIDE 20 MG PO TABS
20.0000 mg | ORAL_TABLET | Freq: Every day | ORAL | Status: DC
  Administered 2016-11-03 – 2016-11-07 (×5): 20 mg via ORAL
  Filled 2016-11-03 (×5): qty 1

## 2016-11-03 MED ORDER — PHENYTOIN SODIUM EXTENDED 100 MG PO CAPS
100.0000 mg | ORAL_CAPSULE | Freq: Every day | ORAL | Status: DC
  Administered 2016-11-03 – 2016-11-07 (×5): 100 mg via ORAL
  Filled 2016-11-03 (×5): qty 1

## 2016-11-03 MED ORDER — INSULIN ASPART 100 UNIT/ML ~~LOC~~ SOLN
0.0000 [IU] | Freq: Three times a day (TID) | SUBCUTANEOUS | Status: DC
  Administered 2016-11-04 – 2016-11-05 (×2): 4 [IU] via SUBCUTANEOUS
  Administered 2016-11-06: 3 [IU] via SUBCUTANEOUS

## 2016-11-03 MED ORDER — ONDANSETRON HCL 4 MG/2ML IJ SOLN
4.0000 mg | Freq: Four times a day (QID) | INTRAMUSCULAR | Status: DC | PRN
  Administered 2016-11-03: 4 mg via INTRAVENOUS
  Filled 2016-11-03: qty 2

## 2016-11-03 MED ORDER — CLONIDINE HCL 0.2 MG PO TABS
0.2000 mg | ORAL_TABLET | Freq: Three times a day (TID) | ORAL | Status: DC
  Administered 2016-11-03 – 2016-11-07 (×11): 0.2 mg via ORAL
  Filled 2016-11-03 (×11): qty 1

## 2016-11-03 MED ORDER — IPRATROPIUM-ALBUTEROL 0.5-2.5 (3) MG/3ML IN SOLN
3.0000 mL | Freq: Four times a day (QID) | RESPIRATORY_TRACT | Status: DC
  Administered 2016-11-04 (×4): 3 mL via RESPIRATORY_TRACT
  Filled 2016-11-03 (×5): qty 3

## 2016-11-03 MED ORDER — DESMOPRESSIN ACETATE 0.2 MG PO TABS
0.2000 mg | ORAL_TABLET | Freq: Two times a day (BID) | ORAL | Status: DC
  Administered 2016-11-04 – 2016-11-07 (×7): 0.2 mg via ORAL
  Filled 2016-11-03 (×13): qty 1

## 2016-11-03 MED ORDER — ISOSORB DINITRATE-HYDRALAZINE 20-37.5 MG PO TABS
1.0000 | ORAL_TABLET | Freq: Three times a day (TID) | ORAL | Status: DC
  Administered 2016-11-03 – 2016-11-07 (×11): 1 via ORAL
  Filled 2016-11-03 (×20): qty 1

## 2016-11-03 MED ORDER — ORAL CARE MOUTH RINSE
15.0000 mL | Freq: Two times a day (BID) | OROMUCOSAL | Status: DC
  Administered 2016-11-03 – 2016-11-07 (×8): 15 mL via OROMUCOSAL

## 2016-11-03 MED ORDER — FUROSEMIDE 10 MG/ML IJ SOLN
40.0000 mg | Freq: Once | INTRAMUSCULAR | Status: AC
  Administered 2016-11-03: 40 mg via INTRAVENOUS
  Filled 2016-11-03: qty 4

## 2016-11-03 MED ORDER — ISOSORB DINITRATE-HYDRALAZINE 20-37.5 MG PO TABS
ORAL_TABLET | ORAL | Status: AC
  Filled 2016-11-03: qty 1

## 2016-11-03 NOTE — ED Notes (Signed)
Labs just drawn

## 2016-11-03 NOTE — ED Notes (Signed)
Pt back on stretcher at this time. Pt states pain to right knee is 0 unless he moves it but pain is better
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
## 2016-11-03 NOTE — ED Notes (Signed)
Lab called for blood work
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
## 2016-11-03 NOTE — ED Triage Notes (Signed)
Pt c/o "a little SOB." Pt reports pain in the L upper rib area. Pt with edema of the R lower extremity, unable to bend his leg.

## 2016-11-03 NOTE — ED Notes (Signed)
Attempting to call report, no answer x 5

## 2016-11-03 NOTE — ED Notes (Signed)
Floor unable to take report.

## 2016-11-03 NOTE — ED Notes (Signed)
Pt taken to xray 

## 2016-11-03 NOTE — ED Notes (Signed)
Floor unable to take report at this time.

## 2016-11-03 NOTE — ED Notes (Signed)
Pt receiving breathing tx 

## 2016-11-03 NOTE — ED Notes (Signed)
Pt has been on cardiac monitor since arrival

## 2016-11-04 LAB — BASIC METABOLIC PANEL
Anion gap: 10 (ref 5–15)
BUN: 52 mg/dL — AB (ref 6–20)
CHLORIDE: 106 mmol/L (ref 101–111)
CO2: 20 mmol/L — AB (ref 22–32)
Calcium: 9.2 mg/dL (ref 8.9–10.3)
Creatinine, Ser: 2.2 mg/dL — ABNORMAL HIGH (ref 0.61–1.24)
GFR calc Af Amer: 33 mL/min — ABNORMAL LOW (ref >60)
GFR calc non Af Amer: 28 mL/min — ABNORMAL LOW (ref >60)
GLUCOSE: 100 mg/dL — AB (ref 65–99)
POTASSIUM: 5.2 mmol/L — AB (ref 3.5–5.1)
Sodium: 136 mmol/L (ref 135–145)

## 2016-11-04 LAB — CBC
HEMATOCRIT: 40.5 % (ref 39.0–52.0)
Hemoglobin: 13.3 g/dL (ref 13.0–17.0)
MCH: 31.2 pg (ref 26.0–34.0)
MCHC: 32.8 g/dL (ref 30.0–36.0)
MCV: 95.1 fL (ref 78.0–100.0)
Platelets: 141 10*3/uL — ABNORMAL LOW (ref 150–400)
RBC: 4.26 MIL/uL (ref 4.22–5.81)
RDW: 15.1 % (ref 11.5–15.5)
WBC: 6.7 10*3/uL (ref 4.0–10.5)

## 2016-11-04 LAB — GLUCOSE, CAPILLARY
GLUCOSE-CAPILLARY: 99 mg/dL (ref 65–99)
Glucose-Capillary: 111 mg/dL — ABNORMAL HIGH (ref 65–99)
Glucose-Capillary: 153 mg/dL — ABNORMAL HIGH (ref 65–99)
Glucose-Capillary: 139 mg/dL — ABNORMAL HIGH (ref 65–99)

## 2016-11-04 MED ORDER — IPRATROPIUM-ALBUTEROL 0.5-2.5 (3) MG/3ML IN SOLN
3.0000 mL | Freq: Three times a day (TID) | RESPIRATORY_TRACT | Status: DC
  Administered 2016-11-05 – 2016-11-07 (×7): 3 mL via RESPIRATORY_TRACT
  Filled 2016-11-04 (×7): qty 3

## 2016-11-04 NOTE — Progress Notes (Signed)
Subjective: Patient was admitted yesterday due to acute exacerbation of COPD and right knee effusion. He had aspiration of his joint effusion and start on emperic iv antibiotics. Cell count is about 7,000 and gram stain and culture result pending. Patient feels better this morning.  Objective: Vital signs in last 24 hours: Temp:  [97.7 F (36.5 C)-100.1 F (37.8 C)] 97.7 F (36.5 C) (11/28 0500) Pulse Rate:  [76-108] 76 (11/28 0500) Resp:  [14-25] 20 (11/28 0500) BP: (113-184)/(42-65) 113/42 (11/28 0500) SpO2:  [96 %-100 %] 98 % (11/28 0752) Weight:  [110.2 kg (243 lb)-115.4 kg (254 lb 6.6 oz)] 115.4 kg (254 lb 6.6 oz) (11/27 2025) Weight change:  Last BM Date: 11/02/16  Intake/Output from previous day: 11/27 0701 - 11/28 0700 In: 750 [P.O.:600; IV Piggyback:150] Out: 1600 [Urine:1600]  PHYSICAL EXAM General appearance: alert and no distress Resp: diminished breath sounds bilaterally and rhonchi bilaterally Cardio: S1, S2 normal GI: soft, non-tender; bowel sounds normal; no masses,  no organomegaly Extremities: tender stiff and swollen left knee  Lab Results:  Results for orders placed or performed during the hospital encounter of 11/03/16 (from the past 48 hour(s))  Troponin I     Status: Abnormal   Collection Time: 11/03/16 12:59 PM  Result Value Ref Range   Troponin I 0.06 (HH) <0.03 ng/mL    Comment: CRITICAL RESULT CALLED TO, READ BACK BY AND VERIFIED WITH: VOGLER,T AT 5852 ON 11.27.2017 BY ISLEY,B   CBC with Differential     Status: Abnormal   Collection Time: 11/03/16 12:59 PM  Result Value Ref Range   WBC 9.1 4.0 - 10.5 K/uL   RBC 3.97 (L) 4.22 - 5.81 MIL/uL   Hemoglobin 12.3 (L) 13.0 - 17.0 g/dL   HCT 37.7 (L) 39.0 - 52.0 %   MCV 95.0 78.0 - 100.0 fL   MCH 31.0 26.0 - 34.0 pg   MCHC 32.6 30.0 - 36.0 g/dL   RDW 15.4 11.5 - 15.5 %   Platelets 133 (L) 150 - 400 K/uL   Neutrophils Relative % 72 %   Neutro Abs 6.5 1.7 - 7.7 K/uL   Lymphocytes Relative 8 %   Lymphs Abs 0.8 0.7 - 4.0 K/uL   Monocytes Relative 13 %   Monocytes Absolute 1.2 (H) 0.1 - 1.0 K/uL   Eosinophils Relative 7 %   Eosinophils Absolute 0.6 0.0 - 0.7 K/uL   Basophils Relative 0 %   Basophils Absolute 0.0 0.0 - 0.1 K/uL  Comprehensive metabolic panel     Status: Abnormal   Collection Time: 11/03/16 12:59 PM  Result Value Ref Range   Sodium 136 135 - 145 mmol/L   Potassium 3.9 3.5 - 5.1 mmol/L   Chloride 105 101 - 111 mmol/L   CO2 23 22 - 32 mmol/L   Glucose, Bld 96 65 - 99 mg/dL   BUN 47 (H) 6 - 20 mg/dL   Creatinine, Ser 2.05 (H) 0.61 - 1.24 mg/dL   Calcium 9.4 8.9 - 10.3 mg/dL   Total Protein 8.1 6.5 - 8.1 g/dL   Albumin 4.0 3.5 - 5.0 g/dL   AST 20 15 - 41 U/L   ALT 24 17 - 63 U/L   Alkaline Phosphatase 37 (L) 38 - 126 U/L   Total Bilirubin 0.5 0.3 - 1.2 mg/dL   GFR calc non Af Amer 31 (L) >60 mL/min   GFR calc Af Amer 36 (L) >60 mL/min    Comment: (NOTE) The eGFR has been calculated using the  CKD EPI equation. This calculation has not been validated in all clinical situations. eGFR's persistently <60 mL/min signify possible Chronic Kidney Disease.    Anion gap 8 5 - 15  Brain natriuretic peptide     Status: Abnormal   Collection Time: 11/03/16 12:59 PM  Result Value Ref Range   B Natriuretic Peptide 305.0 (H) 0.0 - 100.0 pg/mL  Uric acid     Status: Abnormal   Collection Time: 11/03/16 12:59 PM  Result Value Ref Range   Uric Acid, Serum 8.3 (H) 4.4 - 7.6 mg/dL  Synovial cell count + diff, w/ crystals     Status: Abnormal   Collection Time: 11/03/16  4:52 PM  Result Value Ref Range   Color, Synovial RED (A) YELLOW   Appearance-Synovial TURBID (A) CLEAR   Crystals, Fluid INTRACELLULAR MONOSODIUM URATE CRYSTALS    WBC, Synovial 7,260 (H) 0 - 200 /cu mm    Comment: RESULTS CONFIRMED BY MANUAL DILUTION   Neutrophil, Synovial 94 (H) 0 - 25 %   Lymphocytes-Synovial Fld 6 0 - 20 %   Monocyte-Macrophage-Synovial Fluid 0 (L) 50 - 90 %   Eosinophils-Synovial  0 0 - 1 %  Gram stain     Status: None   Collection Time: 11/03/16  4:52 PM  Result Value Ref Range   Specimen Description FLUID RIGHT SYNOVIAL COLLECTED BY DOCTOR    Special Requests NONE    Gram Stain      ABUNDANT WBC PRESENT, PREDOMINANTLY PMN NO ORGANISMS SEEN Performed at [facility]    Report Status 11/03/2016 FINAL   I-Stat CG4 Lactic Acid, ED     Status: None   Collection Time: 11/03/16  4:59 PM  Result Value Ref Range   Lactic Acid, Venous 1.25 0.5 - 1.9 mmol/L  Glucose, capillary     Status: Abnormal   Collection Time: 11/03/16  8:18 PM  Result Value Ref Range   Glucose-Capillary 100 (H) 65 - 99 mg/dL   Comment 1 Notify RN    Comment 2 Document in Chart   Basic metabolic panel     Status: Abnormal   Collection Time: 11/04/16  4:42 AM  Result Value Ref Range   Sodium 136 135 - 145 mmol/L   Potassium 5.2 (H) 3.5 - 5.1 mmol/L    Comment: DELTA CHECK NOTED   Chloride 106 101 - 111 mmol/L   CO2 20 (L) 22 - 32 mmol/L   Glucose, Bld 100 (H) 65 - 99 mg/dL   BUN 52 (H) 6 - 20 mg/dL   Creatinine, Ser 2.20 (H) 0.61 - 1.24 mg/dL   Calcium 9.2 8.9 - 10.3 mg/dL   GFR calc non Af Amer 28 (L) >60 mL/min   GFR calc Af Amer 33 (L) >60 mL/min    Comment: (NOTE) The eGFR has been calculated using the CKD EPI equation. This calculation has not been validated in all clinical situations. eGFR's persistently <60 mL/min signify possible Chronic Kidney Disease.    Anion gap 10 5 - 15  CBC     Status: Abnormal   Collection Time: 11/04/16  4:42 AM  Result Value Ref Range   WBC 6.7 4.0 - 10.5 K/uL   RBC 4.26 4.22 - 5.81 MIL/uL   Hemoglobin 13.3 13.0 - 17.0 g/dL   HCT 40.5 39.0 - 52.0 %   MCV 95.1 78.0 - 100.0 fL   MCH 31.2 26.0 - 34.0 pg   MCHC 32.8 30.0 - 36.0 g/dL   RDW 15.1 11.5 -  15.5 %   Platelets 141 (L) 150 - 400 K/uL    ABGS No results for input(s): PHART, PO2ART, TCO2, HCO3 in the last 72 hours.  Invalid input(s): PCO2 CULTURES Recent Results (from the past  240 hour(s))  Gram stain     Status: None   Collection Time: 11/03/16  4:52 PM  Result Value Ref Range Status   Specimen Description FLUID RIGHT SYNOVIAL COLLECTED BY DOCTOR  Final   Special Requests NONE  Final   Gram Stain   Final    ABUNDANT WBC PRESENT, PREDOMINANTLY PMN NO ORGANISMS SEEN Performed at [facility]    Report Status 11/03/2016 FINAL  Final   Studies/Results: Dg Chest 2 View  Result Date: 11/03/2016 CLINICAL DATA:  Shortness of breath today, LEFT upper rib pain, RIGHT lower extremity edema, history COPD, CHF, diabetes mellitus, hypertension, former smoker EXAM: CHEST  2 VIEW COMPARISON:  09/12/2016 FINDINGS: Enlargement of cardiac silhouette. Mediastinal contours and pulmonary vascularity normal. Lungs grossly clear. No infiltrate, pleural effusion, or pneumothorax. Osseous structures unremarkable. IMPRESSION: Enlargement of cardiac silhouette. No acute infiltrate. Electronically Signed   By: Lavonia Dana M.D.   On: 11/03/2016 13:55   Dg Knee Complete 4 Views Right  Result Date: 11/03/2016 CLINICAL DATA:  Three-day history of right knee pain EXAM: RIGHT KNEE - COMPLETE 4+ VIEW COMPARISON:  August 04, 2016 FINDINGS: Frontal, lateral, and bilateral oblique views were obtained. There is no fracture or dislocation. There is a large joint effusion. There is no appreciable joint space narrowing. 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 IMPRESSION: Large joint effusion. No acute fracture or dislocation. Evidence of chondromalacia patella, stable fall prior study. Electronically Signed   By: Lowella Grip III M.D.   On: 11/03/2016 15:59    Medications: I have reviewed the patient's current medications.  Assesment:  Principal Problem:   COPD with acute exacerbation ([place]) Active Problems:   Obese   Panhypopituitarism ([place])   Diabetes ([place])   Essential hypertension   Diabetes insipidus secondary to vasopressin  deficiency ([place])   Acute dyspnea   Effusion of right knee    Plan:  Medications reviewed Will continue nebulizer treatment and IV steroid Will continue IV antibiotic for now until culture result is available (unlikely to be septic arthritis wbc count only about 7,000) Continue regular treatment    LOS: 1 day   Dayshon Roback 11/04/2016, 8:06 AM

## 2016-11-04 NOTE — Care Management Note (Signed)
Case Management Note  Patient Details  Name: [name] [name] MRN: [id number] Date of Birth: 12-07-1946  Subjective/Objective:   Patient adm from home with COPD exacerbation. Lives alone, has cane and walker PTA. Has home O2 and neb machine at home PTA. He has PCP, transportation and insurance with drug coverage.  Has O2 tank for transport home.            Action/Plan: Anticipate DC home with self care.  Expected Discharge Date:  11/05/16               Expected Discharge Plan:  Home/Self Care  In-House Referral:  NA  Discharge planning Services  CM Consult  Post Acute Care Choice:  NA Choice offered to:  NA  DME Arranged:    DME Agency:     HH Arranged:    HH Agency:     Status of Service:  In process, will continue to follow  If discussed at Long Length of Stay Meetings, dates discussed:    Additional Comments:  Kerie Badger, Chaun[place]y [place], RN 11/04/2016, 3:31 PM

## 2016-11-05 LAB — GLUCOSE, CAPILLARY
GLUCOSE-CAPILLARY: 83 mg/dL (ref 65–99)
GLUCOSE-CAPILLARY: 159 mg/dL — AB (ref 65–99)
GLUCOSE-CAPILLARY: 110 mg/dL — AB (ref 65–99)
Glucose-Capillary: 139 mg/dL — ABNORMAL HIGH (ref 65–99)

## 2016-11-05 LAB — BASIC METABOLIC PANEL
Anion gap: 10 (ref 5–15)
BUN: 68 mg/dL — AB (ref 6–20)
CHLORIDE: 101 mmol/L (ref 101–111)
CO2: 22 mmol/L (ref 22–32)
CREATININE: 2.41 mg/dL — AB (ref 0.61–1.24)
Calcium: 8.8 mg/dL — ABNORMAL LOW (ref 8.9–10.3)
GFR, EST AFRICAN AMERICAN: 29 mL/min — AB (ref >60)
GFR, EST NON AFRICAN AMERICAN: 25 mL/min — AB (ref >60)
Glucose, Bld: 109 mg/dL — ABNORMAL HIGH (ref 65–99)
POTASSIUM: 4.2 mmol/L (ref 3.5–5.1)
SODIUM: 133 mmol/L — AB (ref 135–145)

## 2016-11-05 MED ORDER — SALINE SPRAY 0.65 % NA SOLN
1.0000 | NASAL | Status: DC | PRN
  Administered 2016-11-05: 1 via NASAL
  Filled 2016-11-05: qty 44

## 2016-11-05 NOTE — Care Management Important Message (Signed)
Important Message  Patient Details  Name: CUSTER SESTITO MRN: [id number] Date of Birth: 1946-04-12   Medicare Important Message Given:  Yes    Steffan Caniglia, Chauncey [place], RN 11/05/2016, 2:02 PM

## 2016-11-05 NOTE — Progress Notes (Signed)
Subjective: Patient feels better. His breathing is improving. His knee pain is less. [name] on emperic antibiotics. Culture result is pending.  Objective: Vital signs in last 24 hours: Temp:  [96.6 F (35.9 C)-97.5 F (36.4 C)] 97.5 F (36.4 C) (11/29 0500) Pulse Rate:  [79-81] 79 (11/29 0500) Resp:  [18-20] 20 (11/29 0500) BP: (118-138)/(41-56) 133/56 (11/29 0500) SpO2:  [96 %-100 %] 98 % (11/29 0728) Weight change:  Last BM Date: 11/02/16  Intake/Output from previous day: 11/28 0701 - 11/29 0700 In: 290 [P.O.:240; IV Piggyback:50] Out: 1800 [Urine:1800]  PHYSICAL EXAM General appearance: alert and no distress Resp: diminished breath sounds bilaterally and rhonchi bilaterally Cardio: S1, S2 normal GI: soft, non-tender; bowel sounds normal; no masses,  no organomegaly Extremities: tender stiff and swollen left knee  Lab Results:  Results for orders placed or performed during the hospital encounter of 11/03/16 (from the past 48 hour(s))  Troponin I     Status: Abnormal   Collection Time: 11/03/16 12:59 PM  Result Value Ref Range   Troponin I 0.06 (HH) <0.03 ng/mL    Comment: CRITICAL RESULT CALLED TO, READ BACK BY AND VERIFIED WITH: VOGLER,T AT 2831 ON 11.27.2017 BY ISLEY,B   CBC with Differential     Status: Abnormal   Collection Time: 11/03/16 12:59 PM  Result Value Ref Range   WBC 9.1 4.0 - 10.5 K/uL   RBC 3.97 (L) 4.22 - 5.81 MIL/uL   Hemoglobin 12.3 (L) 13.0 - 17.0 g/dL   HCT 37.7 (L) 39.0 - 52.0 %   MCV 95.0 78.0 - 100.0 fL   MCH 31.0 26.0 - 34.0 pg   MCHC 32.6 30.0 - 36.0 g/dL   RDW 15.4 11.5 - 15.5 %   Platelets 133 (L) 150 - 400 K/uL   Neutrophils Relative % 72 %   Neutro Abs 6.5 1.7 - 7.7 K/uL   Lymphocytes Relative 8 %   Lymphs Abs 0.8 0.7 - 4.0 K/uL   Monocytes Relative 13 %   Monocytes Absolute 1.2 (H) 0.1 - 1.0 K/uL   Eosinophils Relative 7 %   Eosinophils Absolute 0.6 0.0 - 0.7 K/uL   Basophils Relative 0 %   Basophils Absolute 0.0 0.0 - 0.1  K/uL  Comprehensive metabolic panel     Status: Abnormal   Collection Time: 11/03/16 12:59 PM  Result Value Ref Range   Sodium 136 135 - 145 mmol/L   Potassium 3.9 3.5 - 5.1 mmol/L   Chloride 105 101 - 111 mmol/L   CO2 23 22 - 32 mmol/L   Glucose, Bld 96 65 - 99 mg/dL   BUN 47 (H) 6 - 20 mg/dL   Creatinine, Ser 2.05 (H) 0.61 - 1.24 mg/dL   Calcium 9.4 8.9 - 10.3 mg/dL   Total Protein 8.1 6.5 - 8.1 g/dL   Albumin 4.0 3.5 - 5.0 g/dL   AST 20 15 - 41 U/L   ALT 24 17 - 63 U/L   Alkaline Phosphatase 37 (L) 38 - 126 U/L   Total Bilirubin 0.5 0.3 - 1.2 mg/dL   GFR calc non Af Amer 31 (L) >60 mL/min   GFR calc Af Amer 36 (L) >60 mL/min    Comment: (NOTE) The eGFR has been calculated using the CKD EPI equation. This calculation has not been validated in all clinical situations. eGFR's persistently <60 mL/min signify possible Chronic Kidney Disease.    Anion gap 8 5 - 15  Brain natriuretic peptide     Status: Abnormal  Collection Time: 11/03/16 12:59 PM  Result Value Ref Range   B Natriuretic Peptide 305.0 (H) 0.0 - 100.0 pg/mL  Uric acid     Status: Abnormal   Collection Time: 11/03/16 12:59 PM  Result Value Ref Range   Uric Acid, Serum 8.3 (H) 4.4 - 7.6 mg/dL  Blood culture (routine x 2)     Status: None (Preliminary result)   Collection Time: 11/03/16  3:51 PM  Result Value Ref Range   Specimen Description BLOOD LEFT HAND    Special Requests      BOTTLES DRAWN AEROBIC AND ANAEROBIC AEB=4CC ANA=2CC   Culture NO GROWTH < 24 HOURS    Report Status PENDING   Blood culture (routine x 2)     Status: None (Preliminary result)   Collection Time: 11/03/16  3:56 PM  Result Value Ref Range   Specimen Description BLOOD RIGHT ANTECUBITAL    Special Requests      BOTTLES DRAWN AEROBIC AND ANAEROBIC AEB=8CC ANA=5CC   Culture NO GROWTH < 24 HOURS    Report Status PENDING   Synovial cell count + diff, w/ crystals     Status: Abnormal   Collection Time: 11/03/16  4:52 PM  Result Value  Ref Range   Color, Synovial RED (A) YELLOW   Appearance-Synovial TURBID (A) CLEAR   Crystals, Fluid INTRACELLULAR MONOSODIUM URATE CRYSTALS    WBC, Synovial 7,260 (H) 0 - 200 /cu mm    Comment: RESULTS CONFIRMED BY MANUAL DILUTION   Neutrophil, Synovial 94 (H) 0 - 25 %   Lymphocytes-Synovial Fld 6 0 - 20 %   Monocyte-Macrophage-Synovial Fluid 0 (L) 50 - 90 %   Eosinophils-Synovial 0 0 - 1 %  Culture, body fluid-bottle     Status: None (Preliminary result)   Collection Time: 11/03/16  4:52 PM  Result Value Ref Range   Specimen Description FLUID SYNOVIAL RIGHT KNEE    Special Requests BOTTLES DRAWN AEROBIC AND ANAEROBIC 5CC EACH    Culture NO GROWTH < 24 HOURS    Report Status PENDING   Gram stain     Status: None   Collection Time: 11/03/16  4:52 PM  Result Value Ref Range   Specimen Description FLUID RIGHT SYNOVIAL COLLECTED BY DOCTOR    Special Requests NONE    Gram Stain      ABUNDANT WBC PRESENT, PREDOMINANTLY PMN NO ORGANISMS SEEN Performed at [facility]    Report Status 11/03/2016 FINAL   I-Stat CG4 Lactic Acid, ED     Status: None   Collection Time: 11/03/16  4:59 PM  Result Value Ref Range   Lactic Acid, Venous 1.25 0.5 - 1.9 mmol/L  Glucose, capillary     Status: Abnormal   Collection Time: 11/03/16  8:18 PM  Result Value Ref Range   Glucose-Capillary 100 (H) 65 - 99 mg/dL   Comment 1 Notify RN    Comment 2 Document in Chart   Basic metabolic panel     Status: Abnormal   Collection Time: 11/04/16  4:42 AM  Result Value Ref Range   Sodium 136 135 - 145 mmol/L   Potassium 5.2 (H) 3.5 - 5.1 mmol/L    Comment: DELTA CHECK NOTED   Chloride 106 101 - 111 mmol/L   CO2 20 (L) 22 - 32 mmol/L   Glucose, Bld 100 (H) 65 - 99 mg/dL   BUN 52 (H) 6 - 20 mg/dL   Creatinine, Ser 2.20 (H) 0.61 - 1.24 mg/dL   Calcium 9.2  8.9 - 10.3 mg/dL   GFR calc non Af Amer 28 (L) >60 mL/min   GFR calc Af Amer 33 (L) >60 mL/min    Comment: (NOTE) The eGFR has been calculated using  the CKD EPI equation. This calculation has not been validated in all clinical situations. eGFR's persistently <60 mL/min signify possible Chronic Kidney Disease.    Anion gap 10 5 - 15  CBC     Status: Abnormal   Collection Time: 11/04/16  4:42 AM  Result Value Ref Range   WBC 6.7 4.0 - 10.5 K/uL   RBC 4.26 4.22 - 5.81 MIL/uL   Hemoglobin 13.3 13.0 - 17.0 g/dL   HCT 40.5 39.0 - 52.0 %   MCV 95.1 78.0 - 100.0 fL   MCH 31.2 26.0 - 34.0 pg   MCHC 32.8 30.0 - 36.0 g/dL   RDW 15.1 11.5 - 15.5 %   Platelets 141 (L) 150 - 400 K/uL  Glucose, capillary     Status: Abnormal   Collection Time: 11/04/16  8:20 AM  Result Value Ref Range   Glucose-Capillary 111 (H) 65 - 99 mg/dL   Comment 1 Notify RN    Comment 2 Document in Chart   Glucose, capillary     Status: Abnormal   Collection Time: 11/04/16 11:50 AM  Result Value Ref Range   Glucose-Capillary 153 (H) 65 - 99 mg/dL   Comment 1 Notify RN    Comment 2 Document in Chart   Glucose, capillary     Status: None   Collection Time: 11/04/16  4:47 PM  Result Value Ref Range   Glucose-Capillary 99 65 - 99 mg/dL  Glucose, capillary     Status: Abnormal   Collection Time: 11/04/16  9:31 PM  Result Value Ref Range   Glucose-Capillary 139 (H) 65 - 99 mg/dL   Comment 1 Notify RN    Comment 2 Document in Chart   Basic metabolic panel     Status: Abnormal   Collection Time: 11/05/16  4:57 AM  Result Value Ref Range   Sodium 133 (L) 135 - 145 mmol/L   Potassium 4.2 3.5 - 5.1 mmol/L    Comment: DELTA CHECK NOTED   Chloride 101 101 - 111 mmol/L   CO2 22 22 - 32 mmol/L   Glucose, Bld 109 (H) 65 - 99 mg/dL   BUN 68 (H) 6 - 20 mg/dL   Creatinine, Ser 2.41 (H) 0.61 - 1.24 mg/dL   Calcium 8.8 (L) 8.9 - 10.3 mg/dL   GFR calc non Af Amer 25 (L) >60 mL/min   GFR calc Af Amer 29 (L) >60 mL/min    Comment: (NOTE) The eGFR has been calculated using the CKD EPI equation. This calculation has not been validated in all clinical situations. eGFR's  persistently <60 mL/min signify possible Chronic Kidney Disease.    Anion gap 10 5 - 15  Glucose, capillary     Status: None   Collection Time: 11/05/16  7:35 AM  Result Value Ref Range   Glucose-Capillary 83 65 - 99 mg/dL   Comment 1 Notify RN    Comment 2 Document in Chart     ABGS No results for input(s): PHART, PO2ART, TCO2, HCO3 in the last 72 hours.  Invalid input(s): PCO2 CULTURES Recent Results (from the past 240 hour(s))  Blood culture (routine x 2)     Status: None (Preliminary result)   Collection Time: 11/03/16  3:51 PM  Result Value Ref Range Status  Specimen Description BLOOD LEFT HAND  Final   Special Requests   Final    BOTTLES DRAWN AEROBIC AND ANAEROBIC AEB=4CC ANA=2CC   Culture NO GROWTH < 24 HOURS  Final   Report Status PENDING  Incomplete  Blood culture (routine x 2)     Status: None (Preliminary result)   Collection Time: 11/03/16  3:56 PM  Result Value Ref Range Status   Specimen Description BLOOD RIGHT ANTECUBITAL  Final   Special Requests   Final    BOTTLES DRAWN AEROBIC AND ANAEROBIC AEB=8CC ANA=5CC   Culture NO GROWTH < 24 HOURS  Final   Report Status PENDING  Incomplete  Culture, body fluid-bottle     Status: None (Preliminary result)   Collection Time: 11/03/16  4:52 PM  Result Value Ref Range Status   Specimen Description FLUID SYNOVIAL RIGHT KNEE  Final   Special Requests BOTTLES DRAWN AEROBIC AND ANAEROBIC 5CC EACH  Final   Culture NO GROWTH < 24 HOURS  Final   Report Status PENDING  Incomplete  Gram stain     Status: None   Collection Time: 11/03/16  4:52 PM  Result Value Ref Range Status   Specimen Description FLUID RIGHT SYNOVIAL COLLECTED BY DOCTOR  Final   Special Requests NONE  Final   Gram Stain   Final    ABUNDANT WBC PRESENT, PREDOMINANTLY PMN NO ORGANISMS SEEN Performed at [facility]    Report Status 11/03/2016 FINAL  Final   Studies/Results: Dg Chest 2 View  Result Date: 11/03/2016 CLINICAL DATA:  Shortness  of breath today, LEFT upper rib pain, RIGHT lower extremity edema, history COPD, CHF, diabetes mellitus, hypertension, former smoker EXAM: CHEST  2 VIEW COMPARISON:  09/12/2016 FINDINGS: Enlargement of cardiac silhouette. Mediastinal contours and pulmonary vascularity normal. Lungs grossly clear. No infiltrate, pleural effusion, or pneumothorax. Osseous structures unremarkable. IMPRESSION: Enlargement of cardiac silhouette. No acute infiltrate. Electronically Signed   By: Lavonia Dana M.D.   On: 11/03/2016 13:55   Dg Knee Complete 4 Views Right  Result Date: 11/03/2016 CLINICAL DATA:  Three-day history of right knee pain EXAM: RIGHT KNEE - COMPLETE 4+ VIEW COMPARISON:  August 04, 2016 FINDINGS: Frontal, lateral, and bilateral oblique views were obtained. There is no fracture or dislocation. There is a large joint effusion. There is no appreciable joint space narrowing. 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 IMPRESSION: Large joint effusion. No acute fracture or dislocation. Evidence of chondromalacia patella, stable fall prior study. Electronically Signed   By: Lowella Grip III M.D.   On: 11/03/2016 15:59    Medications: I have reviewed the patient's current medications.  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

## 2016-11-06 LAB — GLUCOSE, CAPILLARY
GLUCOSE-CAPILLARY: 136 mg/dL — AB (ref 65–99)
GLUCOSE-CAPILLARY: 120 mg/dL — AB (ref 65–99)
Glucose-Capillary: 84 mg/dL (ref 65–99)
Glucose-Capillary: 110 mg/dL — ABNORMAL HIGH (ref 65–99)

## 2016-11-06 MED ORDER — PREDNISOLONE 5 MG PO TABS
40.0000 mg | ORAL_TABLET | Freq: Every day | ORAL | Status: DC
  Administered 2016-11-06 – 2016-11-07 (×2): 40 mg via ORAL
  Filled 2016-11-06 (×5): qty 8

## 2016-11-06 MED ORDER — LORATADINE 10 MG PO TABS
10.0000 mg | ORAL_TABLET | Freq: Every day | ORAL | Status: DC
  Administered 2016-11-06 – 2016-11-07 (×2): 10 mg via ORAL
  Filled 2016-11-06 (×2): qty 1

## 2016-11-06 NOTE — Progress Notes (Signed)
Subjective: Patient is improving. [name] able to ambulate. [name] receiving Iv antibiotics. His culture result is pending..  Objective: Vital signs in last 24 hours: Temp:  [98 F (36.7 C)] 98 F (36.7 C) (11/30 0523) Pulse Rate:  [77-85] 85 (11/30 0523) Resp:  [20] 20 (11/30 0523) BP: (121-144)/(47-54) 144/54 (11/30 0523) SpO2:  [94 %-98 %] 98 % (11/30 0757) Weight change:  Last BM Date: 11/05/16  Intake/Output from previous day: 11/29 0701 - 11/30 0700 In: 390 [P.O.:240; IV Piggyback:150] Out: 1950 [Urine:1950]  PHYSICAL EXAM General appearance: alert and no distress Resp: diminished breath sounds bilaterally and rhonchi bilaterally Cardio: S1, S2 normal GI: soft, non-tender; bowel sounds normal; no masses,  no organomegaly Extremities: tender stiff and swollen left knee  Lab Results:  Results for orders placed or performed during the hospital encounter of 11/03/16 (from the past 48 hour(s))  Glucose, capillary     Status: Abnormal   Collection Time: 11/04/16  8:20 AM  Result Value Ref Range   Glucose-Capillary 111 (H) 65 - 99 mg/dL   Comment 1 Notify RN    Comment 2 Document in Chart   Glucose, capillary     Status: Abnormal   Collection Time: 11/04/16 11:50 AM  Result Value Ref Range   Glucose-Capillary 153 (H) 65 - 99 mg/dL   Comment 1 Notify RN    Comment 2 Document in Chart   Glucose, capillary     Status: None   Collection Time: 11/04/16  4:47 PM  Result Value Ref Range   Glucose-Capillary 99 65 - 99 mg/dL  Glucose, capillary     Status: Abnormal   Collection Time: 11/04/16  9:31 PM  Result Value Ref Range   Glucose-Capillary 139 (H) 65 - 99 mg/dL   Comment 1 Notify RN    Comment 2 Document in Chart   Basic metabolic panel     Status: Abnormal   Collection Time: 11/05/16  4:57 AM  Result Value Ref Range   Sodium 133 (L) 135 - 145 mmol/L   Potassium 4.2 3.5 - 5.1 mmol/L    Comment: DELTA CHECK NOTED   Chloride 101 101 - 111 mmol/L   CO2 22 22 - 32  mmol/L   Glucose, Bld 109 (H) 65 - 99 mg/dL   BUN 68 (H) 6 - 20 mg/dL   Creatinine, Ser 2.41 (H) 0.61 - 1.24 mg/dL   Calcium 8.8 (L) 8.9 - 10.3 mg/dL   GFR calc non Af Amer 25 (L) >60 mL/min   GFR calc Af Amer 29 (L) >60 mL/min    Comment: (NOTE) The eGFR has been calculated using the CKD EPI equation. This calculation has not been validated in all clinical situations. eGFR's persistently <60 mL/min signify possible Chronic Kidney Disease.    Anion gap 10 5 - 15  Glucose, capillary     Status: None   Collection Time: 11/05/16  7:35 AM  Result Value Ref Range   Glucose-Capillary 83 65 - 99 mg/dL   Comment 1 Notify RN    Comment 2 Document in Chart   Glucose, capillary     Status: Abnormal   Collection Time: 11/05/16 12:11 PM  Result Value Ref Range   Glucose-Capillary 139 (H) 65 - 99 mg/dL  Glucose, capillary     Status: Abnormal   Collection Time: 11/05/16  4:10 PM  Result Value Ref Range   Glucose-Capillary 159 (H) 65 - 99 mg/dL   Comment 1 Notify RN    Comment  2 Document in Chart   Glucose, capillary     Status: Abnormal   Collection Time: 11/05/16  8:18 PM  Result Value Ref Range   Glucose-Capillary 110 (H) 65 - 99 mg/dL   Comment 1 Notify RN    Comment 2 Document in Chart   Glucose, capillary     Status: None   Collection Time: 11/06/16  7:36 AM  Result Value Ref Range   Glucose-Capillary 84 65 - 99 mg/dL    ABGS No results for input(s): PHART, PO2ART, TCO2, HCO3 in the last 72 hours.  Invalid input(s): PCO2 CULTURES Recent Results (from the past 240 hour(s))  Blood culture (routine x 2)     Status: None (Preliminary result)   Collection Time: 11/03/16  3:51 PM  Result Value Ref Range Status   Specimen Description BLOOD LEFT HAND  Final   Special Requests   Final    BOTTLES DRAWN AEROBIC AND ANAEROBIC AEB=4CC ANA=2CC   Culture NO GROWTH 2 DAYS  Final   Report Status PENDING  Incomplete  Blood culture (routine x 2)     Status: None (Preliminary result)    Collection Time: 11/03/16  3:56 PM  Result Value Ref Range Status   Specimen Description BLOOD RIGHT ANTECUBITAL  Final   Special Requests   Final    BOTTLES DRAWN AEROBIC AND ANAEROBIC AEB=8CC ANA=5CC   Culture NO GROWTH 2 DAYS  Final   Report Status PENDING  Incomplete  Culture, body fluid-bottle     Status: None (Preliminary result)   Collection Time: 11/03/16  4:52 PM  Result Value Ref Range Status   Specimen Description FLUID SYNOVIAL RIGHT KNEE  Final   Special Requests BOTTLES DRAWN AEROBIC AND ANAEROBIC 5CC EACH  Final   Culture NO GROWTH 2 DAYS  Final   Report Status PENDING  Incomplete  Gram stain     Status: None   Collection Time: 11/03/16  4:52 PM  Result Value Ref Range Status   Specimen Description FLUID RIGHT SYNOVIAL COLLECTED BY DOCTOR  Final   Special Requests NONE  Final   Gram Stain   Final    ABUNDANT WBC PRESENT, PREDOMINANTLY PMN NO ORGANISMS SEEN Performed at [facility]    Report Status 11/03/2016 FINAL  Final   Studies/Results: No results found.  Medications: I have reviewed the patient's current medications.  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  

## 2016-11-06 NOTE — Progress Notes (Signed)
Patient requested Claritin. Dr. Legrand Rams notified, new order for Claritin 10mg  daily.

## 2016-11-07 LAB — GLUCOSE, CAPILLARY
Glucose-Capillary: 82 mg/dL (ref 65–99)
Glucose-Capillary: 94 mg/dL (ref 65–99)

## 2016-11-07 LAB — CBC
HEMATOCRIT: 35.5 % — AB (ref 39.0–52.0)
Hemoglobin: 11.7 g/dL — ABNORMAL LOW (ref 13.0–17.0)
MCH: 30.3 pg (ref 26.0–34.0)
MCHC: 33 g/dL (ref 30.0–36.0)
MCV: 92 fL (ref 78.0–100.0)
Platelets: 163 10*3/uL (ref 150–400)
RBC: 3.86 MIL/uL — AB (ref 4.22–5.81)
RDW: 14.8 % (ref 11.5–15.5)
WBC: 7.6 10*3/uL (ref 4.0–10.5)

## 2016-11-07 LAB — BASIC METABOLIC PANEL
Anion gap: 12 (ref 5–15)
BUN: 98 mg/dL — AB (ref 6–20)
CHLORIDE: 99 mmol/L — AB (ref 101–111)
CO2: 22 mmol/L (ref 22–32)
Calcium: 9 mg/dL (ref 8.9–10.3)
Creatinine, Ser: 2.53 mg/dL — ABNORMAL HIGH (ref 0.61–1.24)
GFR calc Af Amer: 28 mL/min — ABNORMAL LOW (ref >60)
GFR calc non Af Amer: 24 mL/min — ABNORMAL LOW (ref >60)
Glucose, Bld: 80 mg/dL (ref 65–99)
POTASSIUM: 4.2 mmol/L (ref 3.5–5.1)
SODIUM: 133 mmol/L — AB (ref 135–145)

## 2016-11-07 MED ORDER — PREDNISONE 10 MG (21) PO TBPK: ORAL_TABLET | ORAL | 0 refills | Status: DC

## 2016-11-07 NOTE — Progress Notes (Signed)
Pt's IV catheter removed and intact. Pt's IV site clean dry and intact. Discharge instructions including medications and follow up appointments were reviewed and discussed with patient. Pt verbalized understanding of discharge instructions including medications and follow up appointments. All questions were answered and no further questions at this time. Pt in stable condition and in no acute distress at time of discharge. Pt will be escorted by nurse tech.  

## 2016-11-07 NOTE — Discharge Summary (Signed)
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   calcitRIOL 0.5 MCG capsule Commonly known as:  ROCALTROL Take 0.5 mcg by mouth daily.   cloNIDine 0.2 MG tablet Commonly known as:  CATAPRES Take 0.2 mg by mouth 3 (three) times daily.   desmopressin 0.2 MG tablet Commonly known as:  DDAVP Take 1 tablet (0.2 mg total) by mouth 2 (two) times daily.   Dexamethasone 1.5 MG (27) Tbpk Take 1.5 mg by mouth daily with breakfast.   HYDROcodone-acetaminophen 5-325 MG tablet Commonly known as:  NORCO/VICODIN Take one tablet by mouth every 6 hours as needed for moderate pain. DNE 3gm APAP/24h from all sources   isosorbide-hydrALAZINE 20-37.5 MG tablet Commonly known as:  BIDIL Take 1 tablet by mouth 3 (three) times daily.   KLOR-CON M20 PO Take by mouth.   levothyroxine 150 MCG tablet Commonly known as:  SYNTHROID, LEVOTHROID Take 1 tablet (150 mcg total) by mouth daily before breakfast.   metolazone 5 MG tablet Commonly known as:  ZAROXOLYN Take 5 mg by mouth daily.   phenytoin 100 MG ER capsule Commonly known as:  DILANTIN Take by mouth as directed.   predniSONE 10 MG (21) Tbpk tablet Commonly known as:  STERAPRED UNI-PAK 21 TAB 40 mg po daily for 3 days, 30 mg po daily for 3 days, 20 mg po daily for 3 days, 10 mg po daily for 3 days.   torsemide 20 MG  tablet Commonly known as:  DEMADEX Take 20 mg by mouth daily.       Discharged Condition: improved    Consults: none  Significant Diagnostic Studies: Dg Chest 2 View  Result Date: 11/03/2016 CLINICAL DATA:  Shortness of breath today, LEFT upper rib pain, RIGHT lower extremity edema, history COPD, CHF, diabetes mellitus, hypertension, former smoker EXAM: CHEST  2 VIEW COMPARISON:  09/12/2016 FINDINGS: Enlargement of cardiac silhouette. Mediastinal contours and pulmonary vascularity normal. Lungs grossly clear. No infiltrate, pleural effusion, or pneumothorax. Osseous structures unremarkable. IMPRESSION: Enlargement of cardiac silhouette. No acute infiltrate. Electronically Signed   By: Lavonia Dana M.D.   On: 11/03/2016 13:55   Dg Knee Complete 4 Views Right  Result Date: 11/03/2016 CLINICAL DATA:  Three-day history of right knee pain EXAM: RIGHT KNEE - COMPLETE 4+ VIEW COMPARISON:  August 04, 2016 FINDINGS: Frontal, lateral, and bilateral oblique views were obtained. There is no fracture or dislocation. There is a large joint effusion. There is no appreciable joint space narrowing. 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 IMPRESSION: Large joint effusion. No acute fracture or dislocation. Evidence of chondromalacia patella, stable fall prior study. Electronically Signed   By: Lowella Grip III M.D.   On: 11/03/2016 15:59    Lab Results: Basic Metabolic Panel:  Recent Labs  11/05/16 0457 11/07/16 0444  NA 133* 133*  K 4.2 4.2  CL 101 99*  CO2 22 22  GLUCOSE 109* 80  BUN 68* 98*  CREATININE 2.41* 2.53*  CALCIUM 8.8* 9.0   Liver Function Tests: No results for input(s): AST, ALT, ALKPHOS, BILITOT, PROT, ALBUMIN in the last 72 hours.   CBC:  Recent Labs  11/07/16 0444  WBC 7.6  HGB 11.7*  HCT 35.5*  MCV 92.0  PLT 163    Recent Results (from the past 240 hour(s))  Blood culture (routine x 2)     Status: None  (Preliminary result)   Collection Time: 11/03/16  3:51 PM  Result Value Ref Range Status   Specimen Description BLOOD LEFT HAND  Final   Special Requests   Final    BOTTLES DRAWN AEROBIC AND ANAEROBIC AEB=4CC ANA=2CC   Culture NO GROWTH 3 DAYS  Final   Report Status PENDING  Incomplete  Blood culture (routine x 2)     Status: None (Preliminary result)   Collection Time: 11/03/16  3:56 PM  Result Value Ref Range Status   Specimen Description BLOOD RIGHT ANTECUBITAL  Final   Special Requests   Final    BOTTLES DRAWN AEROBIC AND ANAEROBIC AEB=8CC ANA=5CC   Culture NO GROWTH 3 DAYS  Final   Report Status PENDING  Incomplete  Culture, body fluid-bottle     Status: None (Preliminary result)   Collection Time: 11/03/16  4:52 PM  Result Value Ref Range Status   Specimen Description FLUID SYNOVIAL RIGHT KNEE  Final   Special Requests BOTTLES DRAWN AEROBIC AND ANAEROBIC 5CC EACH  Final   Culture NO GROWTH 3 DAYS  Final   Report Status PENDING  Incomplete  Gram stain     Status: None   Collection Time: 11/03/16  4:52 PM  Result Value Ref Range Status   Specimen Description FLUID RIGHT SYNOVIAL COLLECTED BY DOCTOR  Final   Special Requests NONE  Final   Gram Stain   Final    ABUNDANT WBC PRESENT, PREDOMINANTLY PMN NO ORGANISMS SEEN Performed at [facility]    Report Status 11/03/2016 FINAL  Final     Hospital Course:    This is a 70 years old male with history of multiple medical illnesses was admitted due to COPD exacerbations and tender swollen right with effusion. Aspiration of the knee was done and patient was empirically started on IV antibiotics. He also received Iv steroid and nebulizer treatment. Gram stain and culture of the knee fluid was negative. Patient improved. [name] able to ambulate. The antibiotic discontinued and patient is being discharged on oral steroid.  Discharge Exam: Blood pressure 134/62, pulse 80, temperature 97.7 F (36.5 C), temperature source Oral,  resp. rate 18, height 6\' 1"  (1.854 m), weight 115.4 kg (254 lb 6.6 oz), SpO2 97 %.    Disposition:  home      Signed: Rosalie Buenaventura   11/07/2016, 8:10 AM

## 2016-11-12 LAB — CULTURE, BODY FLUID-BOTTLE: CULTURE: NO GROWTH

## 2016-11-12 LAB — CULTURE, BLOOD (ROUTINE X 2)
Culture: NO GROWTH
Culture: NO GROWTH

## 2016-11-25 ENCOUNTER — Other Ambulatory Visit: Payer: Self-pay | Admitting: Adult Health

## 2016-12-15 ENCOUNTER — Encounter (HOSPITAL_COMMUNITY): Payer: Self-pay | Admitting: *Deleted

## 2016-12-15 ENCOUNTER — Emergency Department (HOSPITAL_COMMUNITY): Payer: Medicare HMO

## 2016-12-15 ENCOUNTER — Inpatient Hospital Stay (HOSPITAL_COMMUNITY)
Admission: EM | Admit: 2016-12-15 | Discharge: 2016-12-21 | DRG: 190 | Disposition: A | Discharge status: Home / Self Care | Payer: Medicare HMO | Attending: Internal Medicine | Admitting: Internal Medicine

## 2016-12-15 DIAGNOSIS — J9620 Acute and chronic respiratory failure, unspecified whether with hypoxia or hypercapnia: Secondary | ICD-10-CM | POA: Diagnosis present

## 2016-12-15 DIAGNOSIS — Z8249 Family history of ischemic heart disease and other diseases of the circulatory system: Secondary | ICD-10-CM | POA: Diagnosis not present

## 2016-12-15 DIAGNOSIS — I4510 Unspecified right bundle-branch block: Secondary | ICD-10-CM | POA: Diagnosis present

## 2016-12-15 DIAGNOSIS — R079 Chest pain, unspecified: Secondary | ICD-10-CM | POA: Diagnosis not present

## 2016-12-15 DIAGNOSIS — J9621 Acute and chronic respiratory failure with hypoxia: Secondary | ICD-10-CM | POA: Diagnosis present

## 2016-12-15 DIAGNOSIS — I272 Pulmonary hypertension, unspecified: Secondary | ICD-10-CM | POA: Diagnosis present

## 2016-12-15 DIAGNOSIS — R140 Abdominal distension (gaseous): Secondary | ICD-10-CM

## 2016-12-15 DIAGNOSIS — E7800 Pure hypercholesterolemia, unspecified: Secondary | ICD-10-CM | POA: Diagnosis not present

## 2016-12-15 DIAGNOSIS — I5042 Chronic combined systolic (congestive) and diastolic (congestive) heart failure: Secondary | ICD-10-CM | POA: Diagnosis present

## 2016-12-15 DIAGNOSIS — E785 Hyperlipidemia, unspecified: Secondary | ICD-10-CM | POA: Diagnosis present

## 2016-12-15 DIAGNOSIS — Z800 Family history of malignant neoplasm of digestive organs: Secondary | ICD-10-CM

## 2016-12-15 DIAGNOSIS — I10 Essential (primary) hypertension: Secondary | ICD-10-CM | POA: Diagnosis not present

## 2016-12-15 DIAGNOSIS — E230 Hypopituitarism: Secondary | ICD-10-CM | POA: Diagnosis present

## 2016-12-15 DIAGNOSIS — I509 Heart failure, unspecified: Secondary | ICD-10-CM | POA: Diagnosis not present

## 2016-12-15 DIAGNOSIS — G40909 Epilepsy, unspecified, not intractable, without status epilepticus: Secondary | ICD-10-CM | POA: Diagnosis present

## 2016-12-15 DIAGNOSIS — I712 Thoracic aortic aneurysm, without rupture: Secondary | ICD-10-CM | POA: Diagnosis present

## 2016-12-15 DIAGNOSIS — R935 Abnormal findings on diagnostic imaging of other abdominal regions, including retroperitoneum: Secondary | ICD-10-CM | POA: Diagnosis not present

## 2016-12-15 DIAGNOSIS — E232 Diabetes insipidus: Secondary | ICD-10-CM | POA: Diagnosis present

## 2016-12-15 DIAGNOSIS — G4730 Sleep apnea, unspecified: Secondary | ICD-10-CM | POA: Diagnosis present

## 2016-12-15 DIAGNOSIS — Z6834 Body mass index (BMI) 34.0-34.9, adult: Secondary | ICD-10-CM

## 2016-12-15 DIAGNOSIS — Z905 Acquired absence of kidney: Secondary | ICD-10-CM

## 2016-12-15 DIAGNOSIS — J441 Chronic obstructive pulmonary disease with (acute) exacerbation: Principal | ICD-10-CM | POA: Diagnosis present

## 2016-12-15 DIAGNOSIS — R0602 Shortness of breath: Secondary | ICD-10-CM | POA: Diagnosis present

## 2016-12-15 DIAGNOSIS — R0902 Hypoxemia: Secondary | ICD-10-CM | POA: Diagnosis not present

## 2016-12-15 DIAGNOSIS — Z87891 Personal history of nicotine dependence: Secondary | ICD-10-CM | POA: Diagnosis not present

## 2016-12-15 DIAGNOSIS — I351 Nonrheumatic aortic (valve) insufficiency: Secondary | ICD-10-CM | POA: Diagnosis present

## 2016-12-15 DIAGNOSIS — Z9119 Patient's noncompliance with other medical treatment and regimen: Secondary | ICD-10-CM

## 2016-12-15 DIAGNOSIS — I5033 Acute on chronic diastolic (congestive) heart failure: Secondary | ICD-10-CM | POA: Diagnosis not present

## 2016-12-15 DIAGNOSIS — I130 Hypertensive heart and chronic kidney disease with heart failure and stage 1 through stage 4 chronic kidney disease, or unspecified chronic kidney disease: Secondary | ICD-10-CM | POA: Diagnosis present

## 2016-12-15 DIAGNOSIS — I2510 Atherosclerotic heart disease of native coronary artery without angina pectoris: Secondary | ICD-10-CM | POA: Diagnosis present

## 2016-12-15 DIAGNOSIS — E038 Other specified hypothyroidism: Secondary | ICD-10-CM | POA: Diagnosis not present

## 2016-12-15 DIAGNOSIS — Z9981 Dependence on supplemental oxygen: Secondary | ICD-10-CM | POA: Diagnosis not present

## 2016-12-15 DIAGNOSIS — I5022 Chronic systolic (congestive) heart failure: Secondary | ICD-10-CM | POA: Diagnosis not present

## 2016-12-15 DIAGNOSIS — Z809 Family history of malignant neoplasm, unspecified: Secondary | ICD-10-CM

## 2016-12-15 DIAGNOSIS — Z79899 Other long term (current) drug therapy: Secondary | ICD-10-CM | POA: Diagnosis not present

## 2016-12-15 DIAGNOSIS — N184 Chronic kidney disease, stage 4 (severe): Secondary | ICD-10-CM | POA: Diagnosis present

## 2016-12-15 DIAGNOSIS — J449 Chronic obstructive pulmonary disease, unspecified: Secondary | ICD-10-CM | POA: Diagnosis not present

## 2016-12-15 DIAGNOSIS — R569 Unspecified convulsions: Secondary | ICD-10-CM

## 2016-12-15 DIAGNOSIS — E039 Hypothyroidism, unspecified: Secondary | ICD-10-CM | POA: Diagnosis present

## 2016-12-15 LAB — CBC WITH DIFFERENTIAL/PLATELET
Basophils Absolute: 0.1 10*3/uL (ref 0.0–0.1)
Basophils Relative: 0 %
Eosinophils Absolute: 0.4 10*3/uL (ref 0.0–0.7)
Eosinophils Relative: 3 %
HEMATOCRIT: 38.2 % — AB (ref 39.0–52.0)
Hemoglobin: 12.5 g/dL — ABNORMAL LOW (ref 13.0–17.0)
LYMPHS ABS: 1.3 10*3/uL (ref 0.7–4.0)
LYMPHS PCT: 10 %
MCH: 31.1 pg (ref 26.0–34.0)
MCHC: 32.7 g/dL (ref 30.0–36.0)
MCV: 95 fL (ref 78.0–100.0)
MONOS PCT: 11 %
Monocytes Absolute: 1.5 10*3/uL — ABNORMAL HIGH (ref 0.1–1.0)
NEUTROS ABS: 10.6 10*3/uL — AB (ref 1.7–7.7)
Neutrophils Relative %: 76 %
Platelets: 193 10*3/uL (ref 150–400)
RBC: 4.02 MIL/uL — ABNORMAL LOW (ref 4.22–5.81)
RDW: 16.6 % — ABNORMAL HIGH (ref 11.5–15.5)
WBC: 13.9 10*3/uL — ABNORMAL HIGH (ref 4.0–10.5)

## 2016-12-15 MED ORDER — METHYLPREDNISOLONE SODIUM SUCC 125 MG IJ SOLR
125.0000 mg | Freq: Once | INTRAMUSCULAR | Status: AC
  Administered 2016-12-16: 125 mg via INTRAVENOUS
  Filled 2016-12-15: qty 2

## 2016-12-15 MED ORDER — IPRATROPIUM BROMIDE 0.02 % IN SOLN
0.5000 mg | Freq: Once | RESPIRATORY_TRACT | Status: AC
  Administered 2016-12-15: 0.5 mg via RESPIRATORY_TRACT
  Filled 2016-12-15: qty 2.5

## 2016-12-15 MED ORDER — ALBUTEROL (5 MG/ML) CONTINUOUS INHALATION SOLN
10.0000 mg/h | INHALATION_SOLUTION | Freq: Once | RESPIRATORY_TRACT | Status: AC
  Administered 2016-12-15: 10 mg/h via RESPIRATORY_TRACT
  Filled 2016-12-15: qty 20
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
## 2016-12-15 NOTE — ED Triage Notes (Signed)
Abdominal pain x 2 days °

## 2016-12-16 DIAGNOSIS — N184 Chronic kidney disease, stage 4 (severe): Secondary | ICD-10-CM | POA: Diagnosis present

## 2016-12-16 DIAGNOSIS — I4510 Unspecified right bundle-branch block: Secondary | ICD-10-CM | POA: Diagnosis present

## 2016-12-16 DIAGNOSIS — I272 Pulmonary hypertension, unspecified: Secondary | ICD-10-CM | POA: Diagnosis present

## 2016-12-16 DIAGNOSIS — I10 Essential (primary) hypertension: Secondary | ICD-10-CM | POA: Diagnosis not present

## 2016-12-16 DIAGNOSIS — I130 Hypertensive heart and chronic kidney disease with heart failure and stage 1 through stage 4 chronic kidney disease, or unspecified chronic kidney disease: Secondary | ICD-10-CM | POA: Diagnosis present

## 2016-12-16 DIAGNOSIS — E038 Other specified hypothyroidism: Secondary | ICD-10-CM | POA: Diagnosis present

## 2016-12-16 DIAGNOSIS — Z6834 Body mass index (BMI) 34.0-34.9, adult: Secondary | ICD-10-CM | POA: Diagnosis not present

## 2016-12-16 DIAGNOSIS — Z8249 Family history of ischemic heart disease and other diseases of the circulatory system: Secondary | ICD-10-CM | POA: Diagnosis not present

## 2016-12-16 DIAGNOSIS — J9620 Acute and chronic respiratory failure, unspecified whether with hypoxia or hypercapnia: Secondary | ICD-10-CM | POA: Diagnosis present

## 2016-12-16 DIAGNOSIS — G40909 Epilepsy, unspecified, not intractable, without status epilepticus: Secondary | ICD-10-CM | POA: Diagnosis present

## 2016-12-16 DIAGNOSIS — I5033 Acute on chronic diastolic (congestive) heart failure: Secondary | ICD-10-CM

## 2016-12-16 DIAGNOSIS — Z800 Family history of malignant neoplasm of digestive organs: Secondary | ICD-10-CM | POA: Diagnosis not present

## 2016-12-16 DIAGNOSIS — Z87891 Personal history of nicotine dependence: Secondary | ICD-10-CM | POA: Diagnosis not present

## 2016-12-16 DIAGNOSIS — E7800 Pure hypercholesterolemia, unspecified: Secondary | ICD-10-CM | POA: Diagnosis not present

## 2016-12-16 DIAGNOSIS — Z79899 Other long term (current) drug therapy: Secondary | ICD-10-CM | POA: Diagnosis not present

## 2016-12-16 DIAGNOSIS — J441 Chronic obstructive pulmonary disease with (acute) exacerbation: Secondary | ICD-10-CM | POA: Diagnosis present

## 2016-12-16 DIAGNOSIS — R0602 Shortness of breath: Secondary | ICD-10-CM | POA: Diagnosis present

## 2016-12-16 DIAGNOSIS — Z905 Acquired absence of kidney: Secondary | ICD-10-CM | POA: Diagnosis not present

## 2016-12-16 DIAGNOSIS — Z9981 Dependence on supplemental oxygen: Secondary | ICD-10-CM | POA: Diagnosis not present

## 2016-12-16 DIAGNOSIS — E230 Hypopituitarism: Secondary | ICD-10-CM | POA: Diagnosis present

## 2016-12-16 DIAGNOSIS — I5042 Chronic combined systolic (congestive) and diastolic (congestive) heart failure: Secondary | ICD-10-CM | POA: Diagnosis present

## 2016-12-16 DIAGNOSIS — Z9119 Patient's noncompliance with other medical treatment and regimen: Secondary | ICD-10-CM | POA: Diagnosis not present

## 2016-12-16 DIAGNOSIS — E232 Diabetes insipidus: Secondary | ICD-10-CM | POA: Diagnosis present

## 2016-12-16 DIAGNOSIS — G4730 Sleep apnea, unspecified: Secondary | ICD-10-CM | POA: Diagnosis present

## 2016-12-16 DIAGNOSIS — E785 Hyperlipidemia, unspecified: Secondary | ICD-10-CM | POA: Diagnosis present

## 2016-12-16 DIAGNOSIS — J9621 Acute and chronic respiratory failure with hypoxia: Secondary | ICD-10-CM | POA: Diagnosis present

## 2016-12-16 DIAGNOSIS — I712 Thoracic aortic aneurysm, without rupture: Secondary | ICD-10-CM | POA: Diagnosis present

## 2016-12-16 DIAGNOSIS — I351 Nonrheumatic aortic (valve) insufficiency: Secondary | ICD-10-CM | POA: Diagnosis present

## 2016-12-16 LAB — BLOOD GAS, ARTERIAL
Acid-Base Excess: 4.3 mmol/L — ABNORMAL HIGH (ref 0.0–2.0)
BICARBONATE: 28.2 mmol/L — AB (ref 20.0–28.0)
Delivery systems: POSITIVE
Drawn by: 317771
EXPIRATORY PAP: 6
FIO2: 0.5
INSPIRATORY PAP: 12
O2 CONTENT: 50 L/min
O2 Saturation: 97.1 %
PO2 ART: 94.9 mmHg (ref 83.0–108.0)
pCO2 arterial: 40.2 mmHg (ref 32.0–48.0)
pH, Arterial: 7.457 — ABNORMAL HIGH (ref 7.350–7.450)

## 2016-12-16 LAB — COMPREHENSIVE METABOLIC PANEL
ALK PHOS: 37 U/L — AB (ref 38–126)
ALT: 26 U/L (ref 17–63)
ANION GAP: 13 (ref 5–15)
AST: 20 U/L (ref 15–41)
Albumin: 4.4 g/dL (ref 3.5–5.0)
BUN: 78 mg/dL — ABNORMAL HIGH (ref 6–20)
CALCIUM: 9.6 mg/dL (ref 8.9–10.3)
CHLORIDE: 95 mmol/L — AB (ref 101–111)
CO2: 32 mmol/L (ref 22–32)
Creatinine, Ser: 2.92 mg/dL — ABNORMAL HIGH (ref 0.61–1.24)
GFR calc non Af Amer: 20 mL/min — ABNORMAL LOW (ref >60)
GFR, EST AFRICAN AMERICAN: 23 mL/min — AB (ref >60)
Glucose, Bld: 120 mg/dL — ABNORMAL HIGH (ref 65–99)
Potassium: 3.4 mmol/L — ABNORMAL LOW (ref 3.5–5.1)
SODIUM: 140 mmol/L (ref 135–145)
Total Bilirubin: 0.5 mg/dL (ref 0.3–1.2)
Total Protein: 8.3 g/dL — ABNORMAL HIGH (ref 6.5–8.1)

## 2016-12-16 LAB — URINALYSIS, ROUTINE W REFLEX MICROSCOPIC
BILIRUBIN URINE: NEGATIVE
Glucose, UA: NEGATIVE mg/dL
Hgb urine dipstick: NEGATIVE
Ketones, ur: NEGATIVE mg/dL
Leukocytes, UA: NEGATIVE
Nitrite: NEGATIVE
PROTEIN: NEGATIVE mg/dL
Specific Gravity, Urine: 1.013 (ref 1.005–1.030)
pH: 5 (ref 5.0–8.0)

## 2016-12-16 LAB — CBC
HEMATOCRIT: 34.8 % — AB (ref 39.0–52.0)
HEMOGLOBIN: 11.6 g/dL — AB (ref 13.0–17.0)
MCH: 31.4 pg (ref 26.0–34.0)
MCHC: 33.3 g/dL (ref 30.0–36.0)
MCV: 94.1 fL (ref 78.0–100.0)
Platelets: 152 10*3/uL (ref 150–400)
RBC: 3.7 MIL/uL — ABNORMAL LOW (ref 4.22–5.81)
RDW: 16.4 % — ABNORMAL HIGH (ref 11.5–15.5)
WBC: 11 10*3/uL — ABNORMAL HIGH (ref 4.0–10.5)

## 2016-12-16 LAB — BASIC METABOLIC PANEL
Anion gap: 11 (ref 5–15)
BUN: 108 mg/dL — AB (ref 6–20)
CALCIUM: 9.2 mg/dL (ref 8.9–10.3)
CO2: 30 mmol/L (ref 22–32)
CREATININE: 2.75 mg/dL — AB (ref 0.61–1.24)
Chloride: 95 mmol/L — ABNORMAL LOW (ref 101–111)
GFR calc non Af Amer: 22 mL/min — ABNORMAL LOW (ref >60)
GFR, EST AFRICAN AMERICAN: 25 mL/min — AB (ref >60)
GLUCOSE: 151 mg/dL — AB (ref 65–99)
Potassium: 3.4 mmol/L — ABNORMAL LOW (ref 3.5–5.1)
Sodium: 136 mmol/L (ref 135–145)

## 2016-12-16 LAB — BRAIN NATRIURETIC PEPTIDE: B NATRIURETIC PEPTIDE 5: 254 pg/mL — AB (ref 0.0–100.0)

## 2016-12-16 LAB — CREATININE, SERUM
Creatinine, Ser: 2.64 mg/dL — ABNORMAL HIGH (ref 0.61–1.24)
GFR calc Af Amer: 26 mL/min — ABNORMAL LOW (ref >60)
GFR, EST NON AFRICAN AMERICAN: 23 mL/min — AB (ref >60)

## 2016-12-16 LAB — TROPONIN I
TROPONIN I: 0.08 ng/mL — AB (ref <0.03)
TROPONIN I: 0.08 ng/mL — AB (ref <0.03)
Troponin I: 0.07 ng/mL (ref <0.03)
Troponin I: 0.1 ng/mL (ref <0.03)

## 2016-12-16 LAB — TSH: TSH: 0.254 u[IU]/mL — AB (ref 0.350–4.500)

## 2016-12-16 LAB — PHENYTOIN LEVEL, TOTAL: Phenytoin Lvl: 8.7 ug/mL — ABNORMAL LOW (ref 10.0–20.0)

## 2016-12-16 MED ORDER — ACETAMINOPHEN 325 MG PO TABS
650.0000 mg | ORAL_TABLET | Freq: Once | ORAL | Status: AC
  Administered 2016-12-16: 650 mg via ORAL
  Filled 2016-12-16: qty 2

## 2016-12-16 MED ORDER — POLYETHYLENE GLYCOL 3350 17 GM/SCOOP PO POWD
17.0000 g | Freq: Every day | ORAL | Status: DC
  Filled 2016-12-16: qty 255

## 2016-12-16 MED ORDER — POLYETHYLENE GLYCOL 3350 17 G PO PACK
17.0000 g | PACK | Freq: Every day | ORAL | Status: DC
  Administered 2016-12-16: 10:00:00 via ORAL
  Administered 2016-12-18 – 2016-12-20 (×3): 17 g via ORAL
  Filled 2016-12-16 (×5): qty 1

## 2016-12-16 MED ORDER — NITROGLYCERIN 0.4 MG SL SUBL
0.4000 mg | SUBLINGUAL_TABLET | SUBLINGUAL | Status: DC | PRN
  Administered 2016-12-16 (×3): 0.4 mg via SUBLINGUAL

## 2016-12-16 MED ORDER — CLONIDINE HCL 0.2 MG PO TABS
0.2000 mg | ORAL_TABLET | Freq: Three times a day (TID) | ORAL | Status: DC
  Administered 2016-12-16 – 2016-12-21 (×14): 0.2 mg via ORAL
  Filled 2016-12-16 (×16): qty 1

## 2016-12-16 MED ORDER — LEVOTHYROXINE SODIUM 75 MCG PO TABS
150.0000 ug | ORAL_TABLET | Freq: Every day | ORAL | Status: DC
  Administered 2016-12-16 – 2016-12-21 (×6): 150 ug via ORAL
  Filled 2016-12-16 (×3): qty 2
  Filled 2016-12-16 (×2): qty 3
  Filled 2016-12-16 (×3): qty 2

## 2016-12-16 MED ORDER — NITROGLYCERIN 0.4 MG SL SUBL
SUBLINGUAL_TABLET | SUBLINGUAL | Status: DC
Start: 2016-12-16 — End: 2016-12-17
  Filled 2016-12-16: qty 1

## 2016-12-16 MED ORDER — METHYLPREDNISOLONE SODIUM SUCC 40 MG IJ SOLR
40.0000 mg | Freq: Four times a day (QID) | INTRAMUSCULAR | Status: DC
  Administered 2016-12-16 – 2016-12-18 (×9): 40 mg via INTRAVENOUS
  Filled 2016-12-16 (×9): qty 1

## 2016-12-16 MED ORDER — TORSEMIDE 20 MG PO TABS
20.0000 mg | ORAL_TABLET | Freq: Every day | ORAL | Status: DC
  Administered 2016-12-16 – 2016-12-21 (×6): 20 mg via ORAL
  Filled 2016-12-16 (×7): qty 1

## 2016-12-16 MED ORDER — NITROGLYCERIN 0.4 MG SL SUBL
SUBLINGUAL_TABLET | SUBLINGUAL | Status: AC
  Filled 2016-12-16: qty 1

## 2016-12-16 MED ORDER — HEPARIN SODIUM (PORCINE) 5000 UNIT/ML IJ SOLN
5000.0000 [IU] | Freq: Three times a day (TID) | INTRAMUSCULAR | Status: DC
  Administered 2016-12-16 – 2016-12-21 (×15): 5000 [IU] via SUBCUTANEOUS
  Filled 2016-12-16 (×15): qty 1

## 2016-12-16 MED ORDER — ALBUTEROL (5 MG/ML) CONTINUOUS INHALATION SOLN
10.0000 mg/h | INHALATION_SOLUTION | Freq: Once | RESPIRATORY_TRACT | Status: AC
  Administered 2016-12-16: 10 mg/h via RESPIRATORY_TRACT

## 2016-12-16 MED ORDER — POTASSIUM CHLORIDE CRYS ER 20 MEQ PO TBCR
20.0000 meq | EXTENDED_RELEASE_TABLET | Freq: Every day | ORAL | Status: DC
  Administered 2016-12-16 – 2016-12-21 (×6): 20 meq via ORAL
  Filled 2016-12-16 (×6): qty 1

## 2016-12-16 MED ORDER — CALCITRIOL 0.25 MCG PO CAPS
0.5000 ug | ORAL_CAPSULE | Freq: Every day | ORAL | Status: DC
  Administered 2016-12-16 – 2016-12-21 (×6): 0.5 ug via ORAL
  Filled 2016-12-16 (×3): qty 2
  Filled 2016-12-16: qty 1
  Filled 2016-12-16 (×2): qty 2
  Filled 2016-12-16: qty 1

## 2016-12-16 MED ORDER — DESMOPRESSIN ACETATE 0.2 MG PO TABS
0.2000 mg | ORAL_TABLET | Freq: Two times a day (BID) | ORAL | Status: DC
  Administered 2016-12-16 – 2016-12-21 (×10): 0.2 mg via ORAL
  Filled 2016-12-16 (×14): qty 1

## 2016-12-16 MED ORDER — ISOSORB DINITRATE-HYDRALAZINE 20-37.5 MG PO TABS
ORAL_TABLET | ORAL | Status: AC
  Filled 2016-12-16: qty 1

## 2016-12-16 MED ORDER — ISOSORB DINITRATE-HYDRALAZINE 20-37.5 MG PO TABS
1.0000 | ORAL_TABLET | Freq: Three times a day (TID) | ORAL | Status: DC
  Administered 2016-12-16 – 2016-12-21 (×14): 1 via ORAL
  Filled 2016-12-16 (×19): qty 1

## 2016-12-16 MED ORDER — PHENYTOIN SODIUM EXTENDED 100 MG PO CAPS
300.0000 mg | ORAL_CAPSULE | Freq: Every day | ORAL | Status: DC
  Administered 2016-12-16 – 2016-12-21 (×6): 300 mg via ORAL
  Filled 2016-12-16 (×6): qty 3

## 2016-12-16 MED ORDER — AMLODIPINE BESYLATE 5 MG PO TABS
5.0000 mg | ORAL_TABLET | Freq: Every day | ORAL | Status: DC
  Administered 2016-12-16 – 2016-12-21 (×6): 5 mg via ORAL
  Filled 2016-12-16 (×6): qty 1

## 2016-12-16 MED ORDER — ALBUTEROL SULFATE (2.5 MG/3ML) 0.083% IN NEBU
2.5000 mg | INHALATION_SOLUTION | Freq: Four times a day (QID) | RESPIRATORY_TRACT | Status: DC
  Administered 2016-12-16 – 2016-12-17 (×4): 2.5 mg via RESPIRATORY_TRACT
  Filled 2016-12-16 (×5): qty 3

## 2016-12-16 MED ORDER — METOLAZONE 5 MG PO TABS
5.0000 mg | ORAL_TABLET | Freq: Every day | ORAL | Status: DC
Start: 2016-12-16 — End: 2016-12-21
  Administered 2016-12-16 – 2016-12-21 (×6): 5 mg via ORAL
  Filled 2016-12-16 (×7): qty 1

## 2016-12-16 MED ORDER — ALBUTEROL SULFATE (2.5 MG/3ML) 0.083% IN NEBU: 2.5000 mg | INHALATION_SOLUTION | RESPIRATORY_TRACT | Status: DC | PRN

## 2016-12-16 MED ORDER — HYDROCODONE-ACETAMINOPHEN 5-325 MG PO TABS
1.0000 | ORAL_TABLET | Freq: Four times a day (QID) | ORAL | Status: DC | PRN
  Administered 2016-12-16 – 2016-12-17 (×2): 1 via ORAL
  Filled 2016-12-16 (×2): qty 1

## 2016-12-16 MED ORDER — POTASSIUM CHLORIDE CRYS ER 20 MEQ PO TBCR
40.0000 meq | EXTENDED_RELEASE_TABLET | Freq: Once | ORAL | Status: AC
  Administered 2016-12-16: 40 meq via ORAL
  Filled 2016-12-16: qty 2

## 2016-12-16 MED ORDER — SODIUM CHLORIDE 0.9% FLUSH
3.0000 mL | Freq: Two times a day (BID) | INTRAVENOUS | Status: DC
  Administered 2016-12-16 – 2016-12-21 (×11): 3 mL via INTRAVENOUS

## 2016-12-16 NOTE — ED Notes (Signed)
Attempted to call report, nurse unavailable and will call me back

## 2016-12-16 NOTE — ED Notes (Signed)
Pt request ice pack for cramping hands. Advised RN. Ice pack provided to pt. Pt request to sit up on the side of bed. Assisted pt to sitting position. Advised pt not to get up without assistance pt agreed.

## 2016-12-16 NOTE — ED Notes (Signed)
Paged Dr Legrand Rams updated on pt. He will be in to see pt tonight

## 2016-12-16 NOTE — ED Notes (Signed)
Pt up to bedside to eat lunch. RT switched to O2 cannula. Tolerated well

## 2016-12-16 NOTE — ED Notes (Signed)
Pt has been off bipap since he ate lunch. Has tolerated O2 @2L /min [place]. Sats 99-100 %. No distress noted

## 2016-12-16 NOTE — Progress Notes (Signed)
Patient was complaining about chest pain , all across the chest 10/10. EKG was done. MD was called and notified. Patient stated the pain was getting better. Patient was reassessed 30 min. laterand stated that chest pain is back 10/10. Nitroglycerin 0.4 mg SL was given x3. Chest pain was completely gone after the 3d pill. Dr. Legrand Rams was called and notified. Order was received to give Morphine for pain as needed. No nitroglycerin drip needed at this time. Continue closely to monitor the patient for any signs and symptoms of chest pain.

## 2016-12-16 NOTE — ED Notes (Signed)
Pt was helped over to the bedside toilet had a watery bowel movement

## 2016-12-16 NOTE — ED Notes (Signed)
Dr Legrand Rams at bedside.

## 2016-12-16 NOTE — ED Notes (Signed)
CRITICAL VALUE ALERT  Critical value received:  Troponin 0.10  Date of notification:  12/16/16  Time of notification:  0036 hrs  Critical value read back:Yes.    Nurse who received alert:  Y. Azaylea Maves, RN  Responding MD:  Dr .Tomi Bamberger  Time MD responded:  0037 hrs

## 2016-12-16 NOTE — Progress Notes (Signed)
Subjective: This is a 71 years old male with history of multiple medical illnesses including COPD was admitted due COPD exacerbation with hypoxemia. Patient was started on BIPAP and iv steroid. Patient feels better today. [name] in ER waiting for bed.  Objective: Vital signs in last 24 hours: Temp:  [97.9 F (36.6 C)-98 F (36.7 C)] 98 F (36.7 C) (01/09 1621) Pulse Rate:  [75-102] 82 (01/09 1800) Resp:  [11-36] 18 (01/09 1800) BP: (97-167)/(35-75) 97/43 (01/09 1800) SpO2:  [97 %-100 %] 100 % (01/09 1800) FiO2 (%):  [50 %] 50 % (01/09 0100) Weight:  [97.5 kg (215 lb)-117 kg (258 lb)] 97.5 kg (215 lb) (01/08 1936) Weight change:     Intake/Output from previous day: No intake/output data recorded.  PHYSICAL EXAM General appearance: no distress and morbidly obese Resp: diminished breath sounds bilaterally and rhonchi bilaterally Cardio: S1, S2 normal GI: soft, non-tender; bowel sounds normal; no masses,  no organomegaly Extremities: 1+ leg edema  Lab Results:  Results for orders placed or performed during the hospital encounter of 12/15/16 (from the past 48 hour(s))  Comprehensive metabolic panel     Status: Abnormal   Collection Time: 12/15/16 11:51 PM  Result Value Ref Range   Sodium 140 135 - 145 mmol/L   Potassium 3.4 (L) 3.5 - 5.1 mmol/L   Chloride 95 (L) 101 - 111 mmol/L   CO2 32 22 - 32 mmol/L   Glucose, Bld 120 (H) 65 - 99 mg/dL   BUN 78 (H) 6 - 20 mg/dL    Comment: RESULTS CONFIRMED BY MANUAL DILUTION   Creatinine, Ser 2.92 (H) 0.61 - 1.24 mg/dL   Calcium 9.6 8.9 - 10.3 mg/dL   Total Protein 8.3 (H) 6.5 - 8.1 g/dL   Albumin 4.4 3.5 - 5.0 g/dL   AST 20 15 - 41 U/L   ALT 26 17 - 63 U/L   Alkaline Phosphatase 37 (L) 38 - 126 U/L   Total Bilirubin 0.5 0.3 - 1.2 mg/dL   GFR calc non Af Amer 20 (L) >60 mL/min   GFR calc Af Amer 23 (L) >60 mL/min    Comment: (NOTE) The eGFR has been calculated using the CKD EPI equation. This calculation has not been validated in  all clinical situations. eGFR's persistently <60 mL/min signify possible Chronic Kidney Disease. CORRECTED ON 01/09 AT 0037: PREVIOUSLY REPORTED AS 23    Anion gap 13 5 - 15  Troponin I     Status: Abnormal   Collection Time: 12/15/16 11:51 PM  Result Value Ref Range   Troponin I 0.10 (HH) <0.03 ng/mL    Comment: CRITICAL RESULT CALLED TO, READ BACK BY AND VERIFIED WITH: WALL Y AT 0035 ON [id number] BY FORSYTH K   Brain natriuretic peptide     Status: Abnormal   Collection Time: 12/15/16 11:51 PM  Result Value Ref Range   B Natriuretic Peptide 254.0 (H) 0.0 - 100.0 pg/mL  CBC with Differential     Status: Abnormal   Collection Time: 12/15/16 11:51 PM  Result Value Ref Range   WBC 13.9 (H) 4.0 - 10.5 K/uL   RBC 4.02 (L) 4.22 - 5.81 MIL/uL   Hemoglobin 12.5 (L) 13.0 - 17.0 g/dL   HCT 38.2 (L) 39.0 - 52.0 %   MCV 95.0 78.0 - 100.0 fL   MCH 31.1 26.0 - 34.0 pg   MCHC 32.7 30.0 - 36.0 g/dL   RDW 16.6 (H) 11.5 - 15.5 %   Platelets 193 150 -  400 K/uL   Neutrophils Relative % 76 %   Neutro Abs 10.6 (H) 1.7 - 7.7 K/uL   Lymphocytes Relative 10 %   Lymphs Abs 1.3 0.7 - 4.0 K/uL   Monocytes Relative 11 %   Monocytes Absolute 1.5 (H) 0.1 - 1.0 K/uL   Eosinophils Relative 3 %   Eosinophils Absolute 0.4 0.0 - 0.7 K/uL   Basophils Relative 0 %   Basophils Absolute 0.1 0.0 - 0.1 K/uL  Phenytoin level, total     Status: Abnormal   Collection Time: 12/15/16 11:51 PM  Result Value Ref Range   Phenytoin Lvl 8.7 (L) 10.0 - 20.0 ug/mL  Urinalysis, Routine w reflex microscopic     Status: None   Collection Time: 12/16/16 12:04 AM  Result Value Ref Range   Color, Urine YELLOW YELLOW   APPearance CLEAR CLEAR   Specific Gravity, Urine 1.013 1.005 - 1.030   pH 5.0 5.0 - 8.0   Glucose, UA NEGATIVE NEGATIVE mg/dL   Hgb urine dipstick NEGATIVE NEGATIVE   Bilirubin Urine NEGATIVE NEGATIVE   Ketones, ur NEGATIVE NEGATIVE mg/dL   Protein, ur NEGATIVE NEGATIVE mg/dL   Nitrite NEGATIVE NEGATIVE    Leukocytes, UA NEGATIVE NEGATIVE  Blood gas, arterial ([facility] & [facility] ONLY)     Status: Abnormal   Collection Time: 12/16/16  1:05 AM  Result Value Ref Range   FIO2 0.50    O2 Content 50.0 L/min   Delivery systems BILEVEL POSITIVE AIRWAY PRESSURE    Inspiratory PAP 12.0    Expiratory PAP 6.0    pH, Arterial 7.457 (H) 7.350 - 7.450   pCO2 arterial 40.2 32.0 - 48.0 mmHg   pO2, Arterial 94.9 83.0 - 108.0 mmHg   Bicarbonate 28.2 (H) 20.0 - 28.0 mmol/L   Acid-Base Excess 4.3 (H) 0.0 - 2.0 mmol/L   O2 Saturation 97.1 %   Collection site RIGHT RADIAL    Drawn by [id number]    Sample type ARTERIAL DRAW    Allens test (pass/fail) PASS PASS  CBC     Status: Abnormal   Collection Time: 12/16/16  6:37 AM  Result Value Ref Range   WBC 11.0 (H) 4.0 - 10.5 K/uL   RBC 3.70 (L) 4.22 - 5.81 MIL/uL   Hemoglobin 11.6 (L) 13.0 - 17.0 g/dL   HCT 34.8 (L) 39.0 - 52.0 %   MCV 94.1 78.0 - 100.0 fL   MCH 31.4 26.0 - 34.0 pg   MCHC 33.3 30.0 - 36.0 g/dL   RDW 16.4 (H) 11.5 - 15.5 %   Platelets 152 150 - 400 K/uL  Creatinine, serum     Status: Abnormal   Collection Time: 12/16/16  6:37 AM  Result Value Ref Range   Creatinine, Ser 2.64 (H) 0.61 - 1.24 mg/dL   GFR calc non Af Amer 23 (L) >60 mL/min   GFR calc Af Amer 26 (L) >60 mL/min    Comment: (NOTE) The eGFR has been calculated using the CKD EPI equation. This calculation has not been validated in all clinical situations. eGFR's persistently <60 mL/min signify possible Chronic Kidney Disease.   Troponin I     Status: Abnormal   Collection Time: 12/16/16  6:37 AM  Result Value Ref Range   Troponin I 0.08 (HH) <0.03 ng/mL    Comment: CRITICAL VALUE NOTED.  VALUE IS CONSISTENT WITH PREVIOUSLY REPORTED AND CALLED VALUE.  TSH     Status: Abnormal   Collection Time: 12/16/16  6:43 AM  Result Value Ref  Range   TSH 0.254 (L) 0.350 - 4.500 uIU/mL    Comment: Performed by a 3rd Generation assay with a functional sensitivity of <=0.01 uIU/mL.  Basic metabolic  panel     Status: Abnormal   Collection Time: 12/16/16  6:44 AM  Result Value Ref Range   Sodium 136 135 - 145 mmol/L   Potassium 3.4 (L) 3.5 - 5.1 mmol/L   Chloride 95 (L) 101 - 111 mmol/L   CO2 30 22 - 32 mmol/L   Glucose, Bld 151 (H) 65 - 99 mg/dL   BUN 108 (H) 6 - 20 mg/dL    Comment: RESULTS CONFIRMED BY MANUAL DILUTION   Creatinine, Ser 2.75 (H) 0.61 - 1.24 mg/dL   Calcium 9.2 8.9 - 10.3 mg/dL   GFR calc non Af Amer 22 (L) >60 mL/min   GFR calc Af Amer 25 (L) >60 mL/min    Comment: (NOTE) The eGFR has been calculated using the CKD EPI equation. This calculation has not been validated in all clinical situations. eGFR's persistently <60 mL/min signify possible Chronic Kidney Disease.    Anion gap 11 5 - 15  Troponin I     Status: Abnormal   Collection Time: 12/16/16 12:06 PM  Result Value Ref Range   Troponin I 0.08 (HH) <0.03 ng/mL    Comment: CRITICAL RESULT CALLED TO, READ BACK BY AND VERIFIED WITH: KNOWLES,R AT 1230 ON 12/16/2016 BY ISLEY,B     ABGS  Recent Labs  12/16/16 0105  PHART 7.457*  PO2ART 94.9  HCO3 28.2*   CULTURES No results found for this or any previous visit (from the past 240 hour(s)). Studies/Results: Dg Chest 1 View  Result Date: 12/15/2016 CLINICAL DATA:  Sudden shortness of breath EXAM: CHEST 1 VIEW COMPARISON:  11/03/2016 FINDINGS: There is cardiomegaly without overt failure. No acute consolidation or effusion. Low lung volumes. Stable mediastinal contour. No pneumothorax. IMPRESSION: Cardiomegaly without overt failure.  Low lung volumes. Electronically Signed   By: Donavan Foil M.D.   On: 12/15/2016 23:39    Medications: I have reviewed the patient's current medications.  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

## 2016-12-16 NOTE — Progress Notes (Signed)
Attempting to put pt on BIPAP nurse in room pt complaining of CP.Marland Kitchen Pt on o2 3lpm cann 97% hr 77 rr 14

## 2016-12-16 NOTE — ED Notes (Signed)
Pt sitting up on side of bed. Complaining of legs going numb
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
## 2016-12-17 ENCOUNTER — Inpatient Hospital Stay (HOSPITAL_COMMUNITY): Payer: Medicare HMO

## 2016-12-17 DIAGNOSIS — N184 Chronic kidney disease, stage 4 (severe): Secondary | ICD-10-CM

## 2016-12-17 DIAGNOSIS — I10 Essential (primary) hypertension: Secondary | ICD-10-CM

## 2016-12-17 DIAGNOSIS — E7800 Pure hypercholesterolemia, unspecified: Secondary | ICD-10-CM

## 2016-12-17 DIAGNOSIS — I208 Other forms of angina pectoris: Secondary | ICD-10-CM

## 2016-12-17 DIAGNOSIS — E1322 Other specified diabetes mellitus with diabetic chronic kidney disease: Secondary | ICD-10-CM

## 2016-12-17 DIAGNOSIS — J441 Chronic obstructive pulmonary disease with (acute) exacerbation: Principal | ICD-10-CM

## 2016-12-17 DIAGNOSIS — I129 Hypertensive chronic kidney disease with stage 1 through stage 4 chronic kidney disease, or unspecified chronic kidney disease: Secondary | ICD-10-CM

## 2016-12-17 DIAGNOSIS — I5042 Chronic combined systolic (congestive) and diastolic (congestive) heart failure: Secondary | ICD-10-CM

## 2016-12-17 LAB — MRSA PCR SCREENING: MRSA by PCR: NEGATIVE

## 2016-12-17 MED ORDER — GRX ANALGESIC BALM EX OINT
TOPICAL_OINTMENT | CUTANEOUS | Status: DC | PRN
  Filled 2016-12-17: qty 28

## 2016-12-17 MED ORDER — HYDROCODONE-ACETAMINOPHEN 5-325 MG PO TABS
1.0000 | ORAL_TABLET | Freq: Four times a day (QID) | ORAL | Status: DC | PRN
  Administered 2016-12-17 – 2016-12-21 (×7): 2 via ORAL
  Filled 2016-12-17 (×7): qty 2

## 2016-12-17 MED ORDER — IPRATROPIUM-ALBUTEROL 0.5-2.5 (3) MG/3ML IN SOLN
3.0000 mL | RESPIRATORY_TRACT | Status: DC
  Administered 2016-12-17 – 2016-12-19 (×12): 3 mL via RESPIRATORY_TRACT
  Filled 2016-12-17 (×12): qty 3

## 2016-12-17 NOTE — Consult Note (Addendum)
Consult requested by: Dr. Legrand Rams Consult requested for: COPD exacerbation  HPI: This is a 71 year old who has multiple medical problems including COPD sleep apnea chronic hypoxic respiratory failure chronic systolic heart failure and coronary disease chronic kidney disease stage IV diabetes panhypopituitarism and sleep apnea. He came to the emergency department because of increasing shortness of breath. He said he has had nonproductive cough. He denied chest pain at time of admission but he's had chest pain since she's been in the hospital that required nitroglycerin and morphine. Troponin level is elevated. [name] known to have sleep apnea but he is noncompliant with CPAP. His chest x-ray which I have personally reviewed did not show an infiltrate but he does have cardiomegaly and some venous hypertension he has been started on BiPAP and he says he feels better with the exception of he still has some chest discomfort is in the upper part of his chest bilaterally. He says he is not coughing now. His breathing is doing some better. No nausea vomiting or diarrhea. No abdominal pain. No urinary symptoms. No seizures at home although he does have a seizure disorder. He has been in the hospital on multiple occasions with heart failure and COPD exacerbation  Past Medical History:  Diagnosis Date  . Aortic insufficiency 10/2012   a. Mod-severe, not an operable candidate.  . Candida esophagitis ([place])    a. Probable by EGD 03/2015.  . Cellulitis of lower leg   . Chronic obstructive pulmonary disease (HCC)    Chronic bronchitis;  home oxygen; multiple exacerbations  . Chronic respiratory failure ([place])   . Chronic systolic CHF (congestive heart failure) ([place])    a.  Last echo 11/2014: EF 40-45%, diffuse HK, mild LVH, elevated LVEDP, mod-severe AI with severely thickened leaflets, dilated sinus of valsalva 4.5cm, visualized portion of prox ascending aorta 4.7cm, mild MR, mildly dilated LA/RV/RA, PASP 60. LV  dysfunction felt due to AI. Cath 12/2014 with minimal CAD - 20% LM otherwise minimal luminal irregularities.  . CKD (chronic kidney disease), stage IV (HCC)    S/P right nephrectomy for hypernephroma in 2010  . Diabetes mellitus, type 2 ([place])   . GI bleed    a. upper GIB in 03/2015 wit thrombocytopenia and ABL anemia. b. Colonoscopy with pooling of dark burgundy blood noted throughout colon but without obvious bleeding lesion. EGD 03/07/2015 showed probable candida esophagitis, mild gastritis, source for GI bleed not seen. c. Capsule study showed active bleeding at 2 hours into the SB.  Marland Kitchen Hyperlipidemia    No lipid profile available  . Hypertension   . Hypothyroidism    10/2002: TSH-0.43, T4-0.77  . Obesity 10/28/2012  . 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 Pulmonary hypertension   . RBBB   . Seizure disorder ([place])    Onset after craniotomy  . Sleep apnea    Severe on a sleep study in 12/2010  . Tobacco abuse, in remission    20 pack years; discontinued 1998     Family History  Problem Relation Age of Onset  . Cancer Mother   . Cancer Father   . Cancer Sister   . Heart failure Sister   . Cancer Brother   . Colon cancer Neg Hx      Social History   Social History  . Marital status: Single    Spouse name: N/A  . Number of children: 1  . Years  of education: N/A   Occupational History  . Retired     Engineer, manufacturing systems   Social History Main Topics  . Smoking status: Former Smoker    Packs/day: 1.00    Types: Cigarettes    Start date: 11/20/1960    Quit date: 11/16/1989  . Smokeless tobacco: Never Used  . Alcohol use No  . Drug use: No  . Sexual activity: No   Other Topics Concern  . None   Social History Narrative   Lives in [place] alone with good family support from his daughter.     ROS: Except as mentioned 10 point review of systems is  negative    Objective: Vital signs in last 24 hours: Temp:  [97.6 F (36.4 C)-98.1 F (36.7 C)] 97.6 F (36.4 C) (01/10 0400) Pulse Rate:  [70-89] 70 (01/10 0154) Resp:  [11-21] 18 (01/10 0154) BP: (97-151)/(35-75) 132/57 (01/09 2251) SpO2:  [97 %-100 %] 99 % (01/10 0154) Weight:  [117.5 kg (259 lb 0.7 oz)] 117.5 kg (259 lb 0.7 oz) (01/10 0500) Weight change: 0.472 kg (1 lb 0.7 oz)    Intake/Output from previous day: 01/09 0701 - 01/10 0700 In: 963 [P.O.:960; I.V.:3] Out: 1525 [Urine:1525]  PHYSICAL EXAM Constitutional: [name] a well-developed well-nourished male who is on BiPAP. Eyes: Pupils react EOMI. Ears nose mouth and throat: His mucous membranes are slightly dry. Hearing is grossly normal. Cardiovascular: His heart is regular with systolic heart murmur that is unchanged. He has trace edema but much less than when I seen on other occasions and he also has some venous stasis changes. Respiratory: His respiratory effort is increased. [name] on BiPAP. His lungs show rhonchi bilaterally but no wheezing. Gastrointestinal: His abdomen is soft obese with no masses. Bowel sounds are present and active. Musculoskeletal: [name] able to move all 4 extremities and strength is grossly normal. Neurological: No focal abnormalities psychiatric: He has a somewhat flat affect which is chronic  Lab Results: Basic Metabolic Panel:  Recent Labs  12/15/16 2351 12/16/16 0637 12/16/16 0644  NA 140  --  136  K 3.4*  --  3.4*  CL 95*  --  95*  CO2 32  --  30  GLUCOSE 120*  --  151*  BUN 78*  --  108*  CREATININE 2.92* 2.64* 2.75*  CALCIUM 9.6  --  9.2   Liver Function Tests:  Recent Labs  12/15/16 2351  AST 20  ALT 26  ALKPHOS 37*  BILITOT 0.5  PROT 8.3*  ALBUMIN 4.4   No results for input(s): LIPASE, AMYLASE in the last 72 hours. No results for input(s): AMMONIA in the last 72 hours. CBC:  Recent Labs  12/15/16 2351 12/16/16 0637  WBC 13.9* 11.0*  NEUTROABS 10.6*  --   HGB  12.5* 11.6*  HCT 38.2* 34.8*  MCV 95.0 94.1  PLT 193 152   Cardiac Enzymes:  Recent Labs  12/16/16 0637 12/16/16 1206 12/16/16 1848  TROPONINI 0.08* 0.08* 0.07*   BNP: No results for input(s): PROBNP in the last 72 hours. D-Dimer: No results for input(s): DDIMER in the last 72 hours. CBG: No results for input(s): GLUCAP in the last 72 hours. Hemoglobin A1C: No results for input(s): HGBA1C in the last 72 hours. Fasting Lipid Panel: No results for input(s): CHOL, HDL, LDLCALC, TRIG, CHOLHDL, LDLDIRECT in the last 72 hours. Thyroid Function Tests:  Recent Labs  12/16/16 0643  TSH 0.254*   Anemia Panel: No results for input(s): VITAMINB12,  FOLATE, FERRITIN, TIBC, IRON, RETICCTPCT in the last 72 hours. Coagulation: No results for input(s): LABPROT, INR in the last 72 hours. Urine Drug Screen: Drugs of Abuse  No results found for: LABOPIA, COCAINSCRNUR, LABBENZ, AMPHETMU, THCU, LABBARB  Alcohol Level: No results for input(s): ETH in the last 72 hours. Urinalysis:  Recent Labs  12/16/16 0004  COLORURINE YELLOW  LABSPEC 1.013  PHURINE 5.0  GLUCOSEU NEGATIVE  HGBUR NEGATIVE  BILIRUBINUR NEGATIVE  KETONESUR NEGATIVE  PROTEINUR NEGATIVE  NITRITE NEGATIVE  LEUKOCYTESUR NEGATIVE   Misc. Labs:   ABGS:  Recent Labs  12/16/16 0105  PHART 7.457*  PO2ART 94.9  HCO3 28.2*     MICROBIOLOGY: Recent Results (from the past 240 hour(s))  MRSA PCR Screening     Status: None   Collection Time: 12/16/16 11:50 PM  Result Value Ref Range Status   MRSA by PCR NEGATIVE NEGATIVE Final    Comment:        The GeneXpert MRSA Assay (FDA approved for NASAL specimens only), is one component of a comprehensive MRSA colonization surveillance program. It is not intended to diagnose MRSA infection nor to guide or monitor treatment for MRSA infections.     Studies/Results: Dg Chest 1 View  Result Date: 12/15/2016 CLINICAL DATA:  Sudden shortness of breath EXAM: CHEST 1  VIEW COMPARISON:  11/03/2016 FINDINGS: There is cardiomegaly without overt failure. No acute consolidation or effusion. Low lung volumes. Stable mediastinal contour. No pneumothorax. IMPRESSION: Cardiomegaly without overt failure.  Low lung volumes. Electronically Signed   By: Donavan Foil M.D.   On: 12/15/2016 23:39    Medications:  Prior to Admission:  Prescriptions Prior to Admission  Medication Sig Dispense Refill Last Dose  . amLODipine (NORVASC) 5 MG tablet TAKE ONE TABLET BY MOUTH DAILY. 30 tablet 6 Past Week at Unknown time  . calcitRIOL (ROCALTROL) 0.5 MCG capsule Take 0.5 mcg by mouth daily.   Past Week at Unknown time  . cloNIDine (CATAPRES) 0.2 MG tablet Take 0.2 mg by mouth 3 (three) times daily.   Past Week at Unknown time  . desmopressin (DDAVP) 0.2 MG tablet Take 1 tablet (0.2 mg total) by mouth 2 (two) times daily. 60 tablet 12 Past Week at Unknown time  . Dexamethasone 1.5 MG (27) TBPK Take 1.5 mg by mouth daily with breakfast. 30 each 12 Past Week at Unknown time  . isosorbide-hydrALAZINE (BIDIL) 20-37.5 MG tablet Take 1 tablet by mouth 3 (three) times daily. 90 tablet 3 Past Week at Unknown time  . levothyroxine (SYNTHROID, LEVOTHROID) 150 MCG tablet Take 1 tablet (150 mcg total) by mouth daily before breakfast. 30 tablet 12 Past Week at Unknown time  . metolazone (ZAROXOLYN) 5 MG tablet Take 5 mg by mouth daily.   Past Week at Unknown time  . phenytoin (DILANTIN) 100 MG ER capsule Take 100 mg by mouth 3 (three) times daily.   Past Week at Unknown time  . polyethylene glycol powder (GLYCOLAX/MIRALAX) powder Take 17 g by mouth daily.    Past Week at Unknown time  . Potassium Chloride Crys ER (KLOR-CON M20 PO) Take 20 mEq by mouth 2 (two) times daily.    Past Week at Unknown time  . simvastatin (ZOCOR) 20 MG tablet Take 20 mg by mouth daily.   Past Week at Unknown time  . torsemide (DEMADEX) 20 MG tablet Take 40 mg by mouth 2 (two) times daily.    Past Week at Unknown time    Scheduled: . albuterol  2.5 mg Nebulization Q6H  . amLODipine  5 mg Oral Daily  . calcitRIOL  0.5 mcg Oral Daily  . cloNIDine  0.2 mg Oral TID  . desmopressin  0.2 mg Oral BID  . heparin  5,000 Units Subcutaneous Q8H  . isosorbide-hydrALAZINE  1 tablet Oral TID  . levothyroxine  150 mcg Oral QAC breakfast  . methylPREDNISolone (SOLU-MEDROL) injection  40 mg Intravenous Q6H  . metolazone  5 mg Oral Daily  . nitroGLYCERIN      . phenytoin  300 mg Oral Daily  . polyethylene glycol  17 g Oral Daily  . potassium chloride SA  20 mEq Oral Daily  . sodium chloride flush  3 mL Intravenous Q12H  . torsemide  20 mg Oral Daily   Continuous:  [id number], HYDROcodone-acetaminophen, nitroGLYCERIN  Assesment: He was admitted with COPD exacerbation and acute on chronic respiratory failure. He's been requiring BiPAP. He may BLD come off the BiPAP today. [name] on appropriate treatment including nebulizer although I have switched him to do on AB, IV steroids BiPAP. I don't think he needs antibiotics at this point because he's not having a productive cough. 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 See if he can come off BiPAP today. He will needed at night at least while in the hospital. He should be using CPAP at home but he is noncompliant.This seems to have produced some of the problem that he's had recently with heart failure. Considering his chest pain heart failure renal failure panhypopituitarism respiratory failure and  COPD with acute exacerbation he remains high risk for needing to be intubated and placed on mechanical ventilation at this point    LOS: 1 day   Malaysha Arlen L 12/17/2016, 7:16 AM
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
## 2016-12-17 NOTE — Progress Notes (Signed)
Subjective: Patient is admitted to ICU and using BIPAP as needed. [name] complaining of diffuse chest pain. He was given pain medications nitroglycerine last night. He has also abdominal distension and discomfort. [name] moving his bowel wel and tolerating his diet, His breathing is better.  Objective: Vital signs in last 24 hours: Temp:  [97.6 F (36.4 C)-98.1 F (36.7 C)] 97.6 F (36.4 C) (01/10 0400) Pulse Rate:  [70-89] 70 (01/10 0154) Resp:  [11-21] 18 (01/10 0154) BP: (97-151)/(35-75) 132/57 (01/09 2251) SpO2:  [97 %-100 %] 100 % (01/10 0756) Weight:  [117.5 kg (259 lb 0.7 oz)] 117.5 kg (259 lb 0.7 oz) (01/10 0500) Weight change: 0.472 kg (1 lb 0.7 oz)    Intake/Output from previous day: 01/09 0701 - 01/10 0700 In: 963 [P.O.:960; I.V.:3] Out: 1525 [Urine:1525]  PHYSICAL EXAM General appearance: no distress and morbidly obese Resp: diminished breath sounds bilaterally and rhonchi bilaterally Cardio: S1, S2 normal GI: soft, non-tender; bowel sounds normal; no masses,  no organomegaly Extremities: 1+ leg edema  Lab Results:  Results for orders placed or performed during the hospital encounter of 12/15/16 (from the past 48 hour(s))  Comprehensive metabolic panel     Status: Abnormal   Collection Time: 12/15/16 11:51 PM  Result Value Ref Range   Sodium 140 135 - 145 mmol/L   Potassium 3.4 (L) 3.5 - 5.1 mmol/L   Chloride 95 (L) 101 - 111 mmol/L   CO2 32 22 - 32 mmol/L   Glucose, Bld 120 (H) 65 - 99 mg/dL   BUN 78 (H) 6 - 20 mg/dL    Comment: RESULTS CONFIRMED BY MANUAL DILUTION   Creatinine, Ser 2.92 (H) 0.61 - 1.24 mg/dL   Calcium 9.6 8.9 - 10.3 mg/dL   Total Protein 8.3 (H) 6.5 - 8.1 g/dL   Albumin 4.4 3.5 - 5.0 g/dL   AST 20 15 - 41 U/L   ALT 26 17 - 63 U/L   Alkaline Phosphatase 37 (L) 38 - 126 U/L   Total Bilirubin 0.5 0.3 - 1.2 mg/dL   GFR calc non Af Amer 20 (L) >60 mL/min   GFR calc Af Amer 23 (L) >60 mL/min    Comment: (NOTE) The eGFR has been calculated  using the CKD EPI equation. This calculation has not been validated in all clinical situations. eGFR's persistently <60 mL/min signify possible Chronic Kidney Disease. CORRECTED ON 01/09 AT 0037: PREVIOUSLY REPORTED AS 23    Anion gap 13 5 - 15  Troponin I     Status: Abnormal   Collection Time: 12/15/16 11:51 PM  Result Value Ref Range   Troponin I 0.10 (HH) <0.03 ng/mL    Comment: CRITICAL RESULT CALLED TO, READ BACK BY AND VERIFIED WITH: WALL Y AT 0035 ON [id number] BY FORSYTH K   Brain natriuretic peptide     Status: Abnormal   Collection Time: 12/15/16 11:51 PM  Result Value Ref Range   B Natriuretic Peptide 254.0 (H) 0.0 - 100.0 pg/mL  CBC with Differential     Status: Abnormal   Collection Time: 12/15/16 11:51 PM  Result Value Ref Range   WBC 13.9 (H) 4.0 - 10.5 K/uL   RBC 4.02 (L) 4.22 - 5.81 MIL/uL   Hemoglobin 12.5 (L) 13.0 - 17.0 g/dL   HCT 38.2 (L) 39.0 - 52.0 %   MCV 95.0 78.0 - 100.0 fL   MCH 31.1 26.0 - 34.0 pg   MCHC 32.7 30.0 - 36.0 g/dL   RDW 16.6 (H)  11.5 - 15.5 %   Platelets 193 150 - 400 K/uL   Neutrophils Relative % 76 %   Neutro Abs 10.6 (H) 1.7 - 7.7 K/uL   Lymphocytes Relative 10 %   Lymphs Abs 1.3 0.7 - 4.0 K/uL   Monocytes Relative 11 %   Monocytes Absolute 1.5 (H) 0.1 - 1.0 K/uL   Eosinophils Relative 3 %   Eosinophils Absolute 0.4 0.0 - 0.7 K/uL   Basophils Relative 0 %   Basophils Absolute 0.1 0.0 - 0.1 K/uL  Phenytoin level, total     Status: Abnormal   Collection Time: 12/15/16 11:51 PM  Result Value Ref Range   Phenytoin Lvl 8.7 (L) 10.0 - 20.0 ug/mL  Urinalysis, Routine w reflex microscopic     Status: None   Collection Time: 12/16/16 12:04 AM  Result Value Ref Range   Color, Urine YELLOW YELLOW   APPearance CLEAR CLEAR   Specific Gravity, Urine 1.013 1.005 - 1.030   pH 5.0 5.0 - 8.0   Glucose, UA NEGATIVE NEGATIVE mg/dL   Hgb urine dipstick NEGATIVE NEGATIVE   Bilirubin Urine NEGATIVE NEGATIVE   Ketones, ur NEGATIVE NEGATIVE mg/dL    Protein, ur NEGATIVE NEGATIVE mg/dL   Nitrite NEGATIVE NEGATIVE   Leukocytes, UA NEGATIVE NEGATIVE  Blood gas, arterial ([facility] & [facility] ONLY)     Status: Abnormal   Collection Time: 12/16/16  1:05 AM  Result Value Ref Range   FIO2 0.50    O2 Content 50.0 L/min   Delivery systems BILEVEL POSITIVE AIRWAY PRESSURE    Inspiratory PAP 12.0    Expiratory PAP 6.0    pH, Arterial 7.457 (H) 7.350 - 7.450   pCO2 arterial 40.2 32.0 - 48.0 mmHg   pO2, Arterial 94.9 83.0 - 108.0 mmHg   Bicarbonate 28.2 (H) 20.0 - 28.0 mmol/L   Acid-Base Excess 4.3 (H) 0.0 - 2.0 mmol/L   O2 Saturation 97.1 %   Collection site RIGHT RADIAL    Drawn by [id number]    Sample type ARTERIAL DRAW    Allens test (pass/fail) PASS PASS  CBC     Status: Abnormal   Collection Time: 12/16/16  6:37 AM  Result Value Ref Range   WBC 11.0 (H) 4.0 - 10.5 K/uL   RBC 3.70 (L) 4.22 - 5.81 MIL/uL   Hemoglobin 11.6 (L) 13.0 - 17.0 g/dL   HCT 34.8 (L) 39.0 - 52.0 %   MCV 94.1 78.0 - 100.0 fL   MCH 31.4 26.0 - 34.0 pg   MCHC 33.3 30.0 - 36.0 g/dL   RDW 16.4 (H) 11.5 - 15.5 %   Platelets 152 150 - 400 K/uL  Creatinine, serum     Status: Abnormal   Collection Time: 12/16/16  6:37 AM  Result Value Ref Range   Creatinine, Ser 2.64 (H) 0.61 - 1.24 mg/dL   GFR calc non Af Amer 23 (L) >60 mL/min   GFR calc Af Amer 26 (L) >60 mL/min    Comment: (NOTE) The eGFR has been calculated using the CKD EPI equation. This calculation has not been validated in all clinical situations. eGFR's persistently <60 mL/min signify possible Chronic Kidney Disease.   Troponin I     Status: Abnormal   Collection Time: 12/16/16  6:37 AM  Result Value Ref Range   Troponin I 0.08 (HH) <0.03 ng/mL    Comment: CRITICAL VALUE NOTED.  VALUE IS CONSISTENT WITH PREVIOUSLY REPORTED AND CALLED VALUE.  TSH     Status: Abnormal  Collection Time: 12/16/16  6:43 AM  Result Value Ref Range   TSH 0.254 (L) 0.350 - 4.500 uIU/mL    Comment: Performed by a 3rd Generation  assay with a functional sensitivity of <=0.01 uIU/mL.  Basic metabolic panel     Status: Abnormal   Collection Time: 12/16/16  6:44 AM  Result Value Ref Range   Sodium 136 135 - 145 mmol/L   Potassium 3.4 (L) 3.5 - 5.1 mmol/L   Chloride 95 (L) 101 - 111 mmol/L   CO2 30 22 - 32 mmol/L   Glucose, Bld 151 (H) 65 - 99 mg/dL   BUN 108 (H) 6 - 20 mg/dL    Comment: RESULTS CONFIRMED BY MANUAL DILUTION   Creatinine, Ser 2.75 (H) 0.61 - 1.24 mg/dL   Calcium 9.2 8.9 - 10.3 mg/dL   GFR calc non Af Amer 22 (L) >60 mL/min   GFR calc Af Amer 25 (L) >60 mL/min    Comment: (NOTE) The eGFR has been calculated using the CKD EPI equation. This calculation has not been validated in all clinical situations. eGFR's persistently <60 mL/min signify possible Chronic Kidney Disease.    Anion gap 11 5 - 15  Troponin I     Status: Abnormal   Collection Time: 12/16/16 12:06 PM  Result Value Ref Range   Troponin I 0.08 (HH) <0.03 ng/mL    Comment: CRITICAL RESULT CALLED TO, READ BACK BY AND VERIFIED WITH: KNOWLES,R AT 1230 ON 12/16/2016 BY ISLEY,B   Troponin I     Status: Abnormal   Collection Time: 12/16/16  6:48 PM  Result Value Ref Range   Troponin I 0.07 (HH) <0.03 ng/mL    Comment: CRITICAL VALUE NOTED.  VALUE IS CONSISTENT WITH PREVIOUSLY REPORTED AND CALLED VALUE.  MRSA PCR Screening     Status: None   Collection Time: 12/16/16 11:50 PM  Result Value Ref Range   MRSA by PCR NEGATIVE NEGATIVE    Comment:        The GeneXpert MRSA Assay (FDA approved for NASAL specimens only), is one component of a comprehensive MRSA colonization surveillance program. It is not intended to diagnose MRSA infection nor to guide or monitor treatment for MRSA infections.     ABGS  Recent Labs  12/16/16 0105  PHART 7.457*  PO2ART 94.9  HCO3 28.2*   CULTURES Recent Results (from the past 240 hour(s))  MRSA PCR Screening     Status: None   Collection Time: 12/16/16 11:50 PM  Result Value Ref Range  Status   MRSA by PCR NEGATIVE NEGATIVE Final    Comment:        The GeneXpert MRSA Assay (FDA approved for NASAL specimens only), is one component of a comprehensive MRSA colonization surveillance program. It is not intended to diagnose MRSA infection nor to guide or monitor treatment for MRSA infections.    Studies/Results: Dg Chest 1 View  Result Date: 12/15/2016 CLINICAL DATA:  Sudden shortness of breath EXAM: CHEST 1 VIEW COMPARISON:  11/03/2016 FINDINGS: There is cardiomegaly without overt failure. No acute consolidation or effusion. Low lung volumes. Stable mediastinal contour. No pneumothorax. IMPRESSION: Cardiomegaly without overt failure.  Low lung volumes. Electronically Signed   By: Donavan Foil M.D.   On: 12/15/2016 23:39    Medications: I have reviewed the patient's current medications.  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

## 2016-12-18 DIAGNOSIS — I351 Nonrheumatic aortic (valve) insufficiency: Secondary | ICD-10-CM

## 2016-12-18 DIAGNOSIS — I712 Thoracic aortic aneurysm, without rupture: Secondary | ICD-10-CM

## 2016-12-18 DIAGNOSIS — R079 Chest pain, unspecified: Secondary | ICD-10-CM

## 2016-12-18 LAB — BASIC METABOLIC PANEL
ANION GAP: 11 (ref 5–15)
BUN: 109 mg/dL — ABNORMAL HIGH (ref 6–20)
CO2: 27 mmol/L (ref 22–32)
Calcium: 8.8 mg/dL — ABNORMAL LOW (ref 8.9–10.3)
Chloride: 95 mmol/L — ABNORMAL LOW (ref 101–111)
Creatinine, Ser: 2.66 mg/dL — ABNORMAL HIGH (ref 0.61–1.24)
GFR calc Af Amer: 26 mL/min — ABNORMAL LOW (ref >60)
GFR, EST NON AFRICAN AMERICAN: 22 mL/min — AB (ref >60)
GLUCOSE: 132 mg/dL — AB (ref 65–99)
POTASSIUM: 4.1 mmol/L (ref 3.5–5.1)
Sodium: 133 mmol/L — ABNORMAL LOW (ref 135–145)

## 2016-12-18 MED ORDER — METHYLPREDNISOLONE SODIUM SUCC 40 MG IJ SOLR
40.0000 mg | Freq: Two times a day (BID) | INTRAMUSCULAR | Status: DC
  Administered 2016-12-18 – 2016-12-20 (×4): 40 mg via INTRAVENOUS
  Filled 2016-12-18 (×4): qty 1

## 2016-12-18 MED ORDER — MUSCLE RUB 10-15 % EX CREA
TOPICAL_CREAM | CUTANEOUS | Status: DC | PRN
  Administered 2016-12-18: 1 via TOPICAL
  Administered 2016-12-19: 20:00:00 via TOPICAL
  Filled 2016-12-18: qty 85

## 2016-12-18 NOTE — Progress Notes (Signed)
Subjective: He says he feels much better. He's not having chest pain now. [name] not short of breath. He wants to get up and out of bed. His breathing is better. [name] not coughing now. No hemoptysis. No nausea or vomiting.  Objective: Vital signs in last 24 hours: Temp:  [97.6 F (36.4 C)-98.8 F (37.1 C)] 97.8 F (36.6 C) (01/11 0400) Pulse Rate:  [70-83] 75 (01/11 0000) Resp:  [12-23] 16 (01/11 0600) BP: (101-131)/(37-57) 128/47 (01/11 0600) SpO2:  [96 %-100 %] 100 % (01/11 0352) FiO2 (%):  [40 %] 40 % (01/11 0352) Weight:  [118.3 kg (260 lb 12.9 oz)] 118.3 kg (260 lb 12.9 oz) (01/11 0444) Weight change: 0.8 kg (1 lb 12.2 oz) Last BM Date: 12/16/16  Intake/Output from previous day: 01/10 0701 - 01/11 0700 In: 480 [P.O.:480] Out: 2300 [Urine:2300]  PHYSICAL EXAM General appearance: alert, cooperative, mild distress and morbidly obese Resp: rhonchi bilaterally Cardio: regular rate and rhythm, S1, S2 normal, no murmur, click, rub or gallop GI: soft, non-tender; bowel sounds normal; no masses,  no organomegaly Extremities: extremities normal, atraumatic, no cyanosis or edema Skin warm and dry. He has chronic venous stasis changes in his legs. I put that he had no murmur but he does have a heart murmur that is unchanged. Mucous membranes are moist. His affect is somewhat flat  Lab Results:  Results for orders placed or performed during the hospital encounter of 12/15/16 (from the past 48 hour(s))  Troponin I     Status: Abnormal   Collection Time: 12/16/16 12:06 PM  Result Value Ref Range   Troponin I 0.08 (HH) <0.03 ng/mL    Comment: CRITICAL RESULT CALLED TO, READ BACK BY AND VERIFIED WITH: KNOWLES,R AT 1230 ON 12/16/2016 BY ISLEY,B   Troponin I     Status: Abnormal   Collection Time: 12/16/16  6:48 PM  Result Value Ref Range   Troponin I 0.07 (HH) <0.03 ng/mL    Comment: CRITICAL VALUE NOTED.  VALUE IS CONSISTENT WITH PREVIOUSLY REPORTED AND CALLED VALUE.  MRSA PCR  Screening     Status: None   Collection Time: 12/16/16 11:50 PM  Result Value Ref Range   MRSA by PCR NEGATIVE NEGATIVE    Comment:        The GeneXpert MRSA Assay (FDA approved for NASAL specimens only), is one component of a comprehensive MRSA colonization surveillance program. It is not intended to diagnose MRSA infection nor to guide or monitor treatment for MRSA infections.     ABGS  Recent Labs  12/16/16 0105  PHART 7.457*  PO2ART 94.9  HCO3 28.2*   CULTURES Recent Results (from the past 240 hour(s))  MRSA PCR Screening     Status: None   Collection Time: 12/16/16 11:50 PM  Result Value Ref Range Status   MRSA by PCR NEGATIVE NEGATIVE Final    Comment:        The GeneXpert MRSA Assay (FDA approved for NASAL specimens only), is one component of a comprehensive MRSA colonization surveillance program. It is not intended to diagnose MRSA infection nor to guide or monitor treatment for MRSA infections.    Studies/Results: Ct Chest Wo Contrast  Result Date: 12/17/2016 CLINICAL DATA:  Chest pain EXAM: CT CHEST WITHOUT CONTRAST TECHNIQUE: Multidetector CT imaging of the chest was performed following the standard protocol without IV contrast. COMPARISON:  Chest x-ray 12/15/2016 FINDINGS: Cardiovascular: There is cardiomegaly. Scattered coronary artery and aortic calcifications. 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 Ascending aortic maximum diameter 3.8 cm. Mediastinum/Nodes: No mediastinal, hilar, or axillary adenopathy. Lungs/Pleura: Trace bilateral pleural effusions. Dependent atelectasis posteriorly in the lower lobes. Upper Abdomen: Imaging into the upper abdomen shows no acute findings. Musculoskeletal: Chest wall soft tissues are unremarkable. No acute bony abnormality or focal bone lesion. IMPRESSION: Moderate dilatation of the mid to distal  aortic arch measured up to 4.8 cm. Recommend semi-annual imaging followup by CTA or MRA and referral to cardiothoracic surgery if not already obtained. This recommendation follows 2010 ACCF/AHA/AATS/ACR/ASA/SCA/SCAI/SIR/STS/SVM Guidelines for the Diagnosis and Management of Patients With Thoracic Aortic Disease. Circulation. 2010; 121: e266-e36 Scattered coronary artery calcifications. Trace bilateral effusions with dependent atelectasis. Electronically Signed   By: Rolm Baptise M.D.   On: 12/17/2016 10:29   Dg Abd Portable 1v  Result Date: 12/17/2016 CLINICAL DATA:  COPD.  Prior history of pain. EXAM: PORTABLE ABDOMEN - 1 VIEW COMPARISON:  CT 07/20/2016. FINDINGS: Soft tissue structures are unremarkable. Air-filled loops of small and large bowel are noted. Adynamic ileus cannot be excluded. Follow-up abdominal series suggested demonstrate clearing. Surgical clips right abdomen. No acute bony abnormality . IMPRESSION: Air-filled loops of small and large bowel are noted. Mild adynamic ileus cannot be excluded. Follow-up exam suggested to demonstrate clearing. Electronically Signed   By: Marcello Moores  Register   On: 12/17/2016 08:55    Medications:  Prior to Admission:  Prescriptions Prior to Admission  Medication Sig Dispense Refill Last Dose  . amLODipine (NORVASC) 5 MG tablet TAKE ONE TABLET BY MOUTH DAILY. 30 tablet 6 Past Week at Unknown time  . calcitRIOL (ROCALTROL) 0.5 MCG capsule Take 0.5 mcg by mouth daily.   Past Week at Unknown time  . cloNIDine (CATAPRES) 0.2 MG tablet Take 0.2 mg by mouth 3 (three) times daily.   Past Week at Unknown time  . desmopressin (DDAVP) 0.2 MG tablet Take 1 tablet (0.2 mg total) by mouth 2 (two) times daily. 60 tablet 12 Past Week at Unknown time  . Dexamethasone 1.5 MG (27) TBPK Take 1.5 mg by mouth daily with breakfast. 30 each 12 Past Week at Unknown time  . isosorbide-hydrALAZINE (BIDIL) 20-37.5 MG tablet Take 1 tablet by mouth 3 (three) times daily. 90 tablet 3  Past Week at Unknown time  . levothyroxine (SYNTHROID, LEVOTHROID) 150 MCG tablet Take 1 tablet (150 mcg total) by mouth daily before breakfast. 30 tablet 12 Past Week at Unknown time  . metolazone (ZAROXOLYN) 5 MG tablet Take 5 mg by mouth daily.   Past Week at Unknown time  . phenytoin (DILANTIN) 100 MG ER capsule Take 100 mg by mouth 3 (three) times daily.   Past Week at Unknown time  . polyethylene glycol powder (GLYCOLAX/MIRALAX) powder Take 17 g by mouth daily.    Past Week at Unknown time  . Potassium Chloride Crys ER (KLOR-CON M20 PO) Take 20 mEq by mouth 2 (two) times daily.    Past Week at Unknown time  . simvastatin (ZOCOR) 20 MG tablet Take 20 mg by mouth daily.   Past Week at Unknown time  . torsemide (DEMADEX) 20 MG tablet Take 40 mg by mouth 2 (two) times daily.    Past Week at Unknown time   Scheduled: . amLODipine  5 mg Oral Daily  . calcitRIOL  0.5 mcg Oral Daily  . cloNIDine  0.2 mg Oral TID  . desmopressin  0.2 mg Oral BID  . heparin  5,000 Units Subcutaneous Q8H  . ipratropium-albuterol  3 mL Nebulization Q4H  . isosorbide-hydrALAZINE  1 tablet Oral TID  . levothyroxine  150 mcg Oral QAC breakfast  . methylPREDNISolone (SOLU-MEDROL) injection  40 mg Intravenous Q6H  . metolazone  5 mg Oral Daily  . phenytoin  300 mg Oral Daily  . polyethylene glycol  17 g Oral Daily  . potassium chloride SA  20 mEq Oral Daily  . sodium chloride flush  3 mL Intravenous Q12H  . torsemide  20 mg Oral Daily   Continuous:  [id number], GRX ANALGESIC BALM, HYDROcodone-acetaminophen, nitroGLYCERIN  Assesment: He has acute on chronic respiratory failure from COPD exacerbation. He seems much better. He also has sleep apnea and is noncompliant with his CPAP. He has chronic combined systolic and diastolic heart failure which appears to be stable. 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 He had noncontrast CT of the chest which I personally reviewed and I don't see any evidence of dissection but it is a noncontrasted exam. He has chronic renal failure so he could not receive contrast. 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 He says he wants to get up and get out of bed. I think that's appropriate.    LOS: 2 days   Antonin Meininger L 12/18/2016, 7:18 AM

## 2016-12-18 NOTE — Progress Notes (Signed)
Subjective: Patient feels better. His chest/shoulder pain has subsided. His breathing is better. He wants to sit on chair.   Objective: Vital signs in last 24 hours: Temp:  [97.6 F (36.4 C)-98.8 F (37.1 C)] 97.8 F (36.6 C) (01/11 0400) Pulse Rate:  [70-83] 75 (01/11 0000) Resp:  [12-23] 16 (01/11 0600) BP: (101-131)/(37-57) 128/47 (01/11 0600) SpO2:  [91 %-100 %] 91 % (01/11 0758) FiO2 (%):  [40 %] 40 % (01/11 0352) Weight:  [118.3 kg (260 lb 12.9 oz)] 118.3 kg (260 lb 12.9 oz) (01/11 0444) Weight change: 0.8 kg (1 lb 12.2 oz) Last BM Date: 12/16/16  Intake/Output from previous day: 01/10 0701 - 01/11 0700 In: 480 [P.O.:480] Out: 2300 [Urine:2300]  PHYSICAL EXAM General appearance: no distress and morbidly obese Resp: diminished breath sounds bilaterally and rhonchi bilaterally Cardio: S1, S2 normal GI: soft, non-tender; bowel sounds normal; no masses,  no organomegaly Extremities: 1+ leg edema  Lab Results:  Results for orders placed or performed during the hospital encounter of 12/15/16 (from the past 48 hour(s))  Troponin I     Status: Abnormal   Collection Time: 12/16/16 12:06 PM  Result Value Ref Range   Troponin I 0.08 (HH) <0.03 ng/mL    Comment: CRITICAL RESULT CALLED TO, READ BACK BY AND VERIFIED WITH: KNOWLES,R AT 1230 ON 12/16/2016 BY ISLEY,B   Troponin I     Status: Abnormal   Collection Time: 12/16/16  6:48 PM  Result Value Ref Range   Troponin I 0.07 (HH) <0.03 ng/mL    Comment: CRITICAL VALUE NOTED.  VALUE IS CONSISTENT WITH PREVIOUSLY REPORTED AND CALLED VALUE.  MRSA PCR Screening     Status: None   Collection Time: 12/16/16 11:50 PM  Result Value Ref Range   MRSA by PCR NEGATIVE NEGATIVE    Comment:        The GeneXpert MRSA Assay (FDA approved for NASAL specimens only), is one component of a comprehensive MRSA colonization surveillance program. It is not intended to diagnose MRSA infection nor to guide or monitor treatment for MRSA  infections.     ABGS  Recent Labs  12/16/16 0105  PHART 7.457*  PO2ART 94.9  HCO3 28.2*   CULTURES Recent Results (from the past 240 hour(s))  MRSA PCR Screening     Status: None   Collection Time: 12/16/16 11:50 PM  Result Value Ref Range Status   MRSA by PCR NEGATIVE NEGATIVE Final    Comment:        The GeneXpert MRSA Assay (FDA approved for NASAL specimens only), is one component of a comprehensive MRSA colonization surveillance program. It is not intended to diagnose MRSA infection nor to guide or monitor treatment for MRSA infections.    Studies/Results: Ct Chest Wo Contrast  Result Date: 12/17/2016 CLINICAL DATA:  Chest pain EXAM: CT CHEST WITHOUT CONTRAST TECHNIQUE: Multidetector CT imaging of the chest was performed following the standard protocol without IV contrast. COMPARISON:  Chest x-ray 12/15/2016 FINDINGS: Cardiovascular: There is cardiomegaly. Scattered coronary artery and aortic calcifications. 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 Ascending aortic maximum diameter 3.8 cm. Mediastinum/Nodes: No mediastinal, hilar, or axillary adenopathy. Lungs/Pleura: Trace bilateral pleural effusions. Dependent atelectasis posteriorly in the lower lobes. Upper Abdomen: Imaging into the upper abdomen shows no acute findings. Musculoskeletal: Chest wall soft tissues are unremarkable. No acute bony abnormality or focal bone lesion. IMPRESSION:  Moderate dilatation of the mid to distal aortic arch measured up to 4.8 cm. Recommend semi-annual imaging followup by CTA or MRA and referral to cardiothoracic surgery if not already obtained. This recommendation follows 2010 ACCF/AHA/AATS/ACR/ASA/SCA/SCAI/SIR/STS/SVM Guidelines for the Diagnosis and Management of Patients With Thoracic Aortic Disease. Circulation. 2010; 121: e266-e36 Scattered coronary artery  calcifications. Trace bilateral effusions with dependent atelectasis. Electronically Signed   By: Rolm Baptise M.D.   On: 12/17/2016 10:29   Dg Abd Portable 1v  Result Date: 12/17/2016 CLINICAL DATA:  COPD.  Prior history of pain. EXAM: PORTABLE ABDOMEN - 1 VIEW COMPARISON:  CT 07/20/2016. FINDINGS: Soft tissue structures are unremarkable. Air-filled loops of small and large bowel are noted. Adynamic ileus cannot be excluded. Follow-up abdominal series suggested demonstrate clearing. Surgical clips right abdomen. No acute bony abnormality . IMPRESSION: Air-filled loops of small and large bowel are noted. Mild adynamic ileus cannot be excluded. Follow-up exam suggested to demonstrate clearing. Electronically Signed   By: Marcello Moores  Register   On: 12/17/2016 08:55    Medications: I have reviewed the patient's current medications.  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 Continue current treatment    LOS: 2 days   Hawke Villalpando 12/18/2016, 8:34 AM

## 2016-12-18 NOTE — Progress Notes (Signed)
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 Breathing better. Hopes to go home soon.   Inpatient Medications    Scheduled Meds: . amLODipine  5 mg Oral Daily  . calcitRIOL  0.5 mcg Oral Daily  . cloNIDine  0.2 mg Oral TID  . desmopressin  0.2 mg Oral BID  . heparin  5,000 Units Subcutaneous Q8H  . ipratropium-albuterol  3 mL Nebulization Q4H  . isosorbide-hydrALAZINE  1 tablet Oral TID  . levothyroxine  150 mcg Oral QAC breakfast  . methylPREDNISolone (SOLU-MEDROL) injection  40 mg Intravenous Q12H  . metolazone  5 mg Oral Daily  . phenytoin  300 mg Oral Daily  . polyethylene glycol  17 g Oral Daily  . potassium chloride SA  20 mEq Oral Daily  . sodium chloride flush  3 mL Intravenous Q12H  . torsemide  20 mg Oral Daily   Continuous Infusions:  PRN Meds: albuterol, HYDROcodone-acetaminophen, MUSCLE RUB, nitroGLYCERIN   Vital Signs    Vitals:   12/18/16 0444 12/18/16 0500 12/18/16 0600 12/18/16 0758  BP:  (!) 122/52 (!) 128/47   Pulse:      Resp:   16   Temp:      TempSrc:      SpO2:    91%  Weight: 260 lb 12.9 oz (118.3 kg)     Height:        Intake/Output Summary (Last 24 hours) at 12/18/16 0902 Last data filed at 12/18/16 0445  Gross per 24 hour  Intake              480 ml  Output             2150 ml  Net            -1670 ml   Filed Weights   12/16/16 2007 12/17/16 0500 12/18/16 0444  Weight: 259 lb 0.7 oz (117.5 kg) 259 lb 0.7 oz (117.5 kg) 260 lb 12.9 oz (118.3 kg)    Physical Exam    GEN: Well nourished, well developed, in no acute distress.  HEENT: Grossly normal.  Neck: Supple, no JVD, carotid bruits, or masses. Cardiac: RRR 3/6 harsh holosystolic murmur, heard best at the RSB, but with radiation into the apex and carotids.  rubs, or gallops. No clubbing, cyanosis, positive non-pitting edema.  Radials/DP/PT 2+ and equal bilaterally.  Respiratory:  Respirations regular and unlabored, clear to auscultation bilaterally. GI: Soft, nontender, nondistended, BS + x 4. MS: no deformity or atrophy. Skin: warm and dry, no rash. Neuro:  Strength and sensation are intact. Psych: AAOx3.  Normal affect.  Labs    CBC  Recent Labs  12/15/16 2351 12/16/16 0637  WBC 13.9* 11.0*  NEUTROABS 10.6*  --   HGB 12.5* 11.6*  HCT 38.2* 34.8*  MCV 95.0 94.1  PLT 193 0000000   Basic Metabolic Panel  Recent Labs  12/15/16 2351 12/16/16 0637 12/16/16 0644  NA 140  --  136  K 3.4*  --  3.4*  CL 95*  --  95*  CO2 32  --  30  GLUCOSE 120*  --  151*  BUN 78*  --  108*  CREATININE 2.92* 2.64* 2.75*  CALCIUM 9.6  --  9.2   Liver Function Tests  Recent Labs  12/15/16 2351  AST 20  ALT 26  ALKPHOS 37*  BILITOT 0.5  PROT 8.3*  ALBUMIN 4.4   Cardiac Enzymes  Recent Labs  12/16/16 0637 12/16/16 1206 12/16/16 1848  TROPONINI 0.08* 0.08* 0.07*   Thyroid Function Tests  Recent Labs  12/16/16 0643  TSH 0.254*    Telemetry     - Personally Reviewed  ECG     Personally Reviewed  Radiology    Ct Chest Wo Contrast  Result Date: 12/17/2016 CLINICAL DATA:  Chest pain EXAM: CT CHEST WITHOUT CONTRAST TECHNIQUE: Multidetector CT imaging of the chest was performed following the standard protocol without IV contrast. COMPARISON:  Chest x-ray 12/15/2016 FINDINGS: Cardiovascular: There is cardiomegaly. Scattered coronary  artery and aortic calcifications. 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 Ascending aortic maximum diameter 3.8 cm. Mediastinum/Nodes: No mediastinal, hilar, or axillary adenopathy. Lungs/Pleura: Trace bilateral pleural effusions. Dependent atelectasis posteriorly in the lower lobes. Upper Abdomen: Imaging into the upper abdomen shows no acute findings. Musculoskeletal: Chest wall soft tissues are unremarkable. No acute bony abnormality or focal bone lesion. IMPRESSION: Moderate dilatation of the mid to distal aortic arch measured up to 4.8 cm. Recommend semi-annual imaging followup by CTA or MRA and referral to cardiothoracic surgery if not already obtained. This recommendation follows 2010 ACCF/AHA/AATS/ACR/ASA/SCA/SCAI/SIR/STS/SVM Guidelines for the Diagnosis and Management of Patients With Thoracic Aortic Disease. Circulation. 2010; 121: e266-e36 Scattered coronary artery calcifications. Trace bilateral effusions with dependent atelectasis. Electronically Signed   By: Rolm Baptise M.D.   On: 12/17/2016 10:29   Dg Abd Portable 1v  Result Date: 12/17/2016 CLINICAL DATA:  COPD.  Prior history of pain. EXAM: PORTABLE ABDOMEN - 1 VIEW COMPARISON:  CT 07/20/2016. FINDINGS: Soft tissue structures are unremarkable. Air-filled loops of small and large bowel are noted. Adynamic ileus cannot be excluded. Follow-up abdominal series suggested demonstrate clearing. Surgical clips right abdomen. No acute bony abnormality . IMPRESSION: Air-filled loops of small and large bowel are noted. Mild adynamic ileus cannot be excluded. Follow-up exam suggested to demonstrate clearing. Electronically Signed   By: Marcello Moores  Register   On: 12/17/2016 08:55    Cardiac Studies   Echocardiogram 08/05/2016 Left ventricle: The cavity size was mildly dilated. Wall   thickness was increased in a pattern of severe  LVH. Systolic   function was normal. The estimated ejection fraction was in the   range of 50% to 55%. - Aortic valve: There was mild stenosis. There was mild to moderate   regurgitation. Valve area (VTI): 1.37 cm^2. Valve area (Vmax):   1.26 cm^2. Valve area (Vmean): 1.25 cm^2. - Aorta: Appears to be severe dilatation of the ascending aortic   root above the sinus level   suggest f/u CT to further evaluate no obvious dissecton   Patient Profile     71 y.o. male with a hx of moderate AI, mild AS, chronic combined CHF EF 50-55% severe dilatation of ascending aorta echo 07/2016, CKD stage 4,  DM, RBBB. Not surgical candidate secondary to severe obstructive and restrictive lung disease, CKD(r nephrectomy for hypernephroma 2010, hx GI bleed. CT chest showed moderate dilatation of the mid to distal aortic arch measured up to 4.8 cm. No obvious dissection. Cath 12/2014 minimal CAD-20% LM otherwise luminal irregularities. Admitted with increase shortness of breath/COPD exacerbation requiring  BiPAP. [name] also complaining of chest pain   Assessment & Plan    1. Chest Pain: Mildly elevated troponin likely from demand ischemia in the setting of COPD exacerbation. Troponin is flat. Normal coronaries except 20% LM cath   2.Chronic Combined Mixed CHF: No evidence of significant fluid overload this am. Has diuresed 2.300 liters.. Continue torsemide and metolazone. .Na 136, Creatinine 2.75.   3. Aortic regurgitation: Not felt to be a candidate for repair due to COPD. Continue medical management with afterload reduction.   4. Hypertension: Stable Continue clonidine, amlodipine and isosorbide.   Signed, Jory Sims, NP  12/18/2016, 9:02 AM   The patient was seen and examined, and I agree with the history, physical exam, assessment and plan as documented above, with modifications as noted below. No further complaints of chest pain. Noncontrast CT did not show any acute aortic dissection. Could not  receive contrast due to CKD and prior nephrectomy. COPD and chronic diastolic heart failure both appear to be stable.  No further recommendations. Will sign off.  Kate Sable, MD, [facility]  12/18/2016 11:09 AM

## 2016-12-19 MED ORDER — IPRATROPIUM-ALBUTEROL 0.5-2.5 (3) MG/3ML IN SOLN
3.0000 mL | Freq: Three times a day (TID) | RESPIRATORY_TRACT | Status: DC
Start: 2016-12-19 — End: 2016-12-21
  Administered 2016-12-19 – 2016-12-21 (×7): 3 mL via RESPIRATORY_TRACT
  Filled 2016-12-19 (×9): qty 3

## 2016-12-19 MED ORDER — LORATADINE 10 MG PO TABS
10.0000 mg | ORAL_TABLET | Freq: Every day | ORAL | Status: DC
  Administered 2016-12-19 – 2016-12-21 (×3): 10 mg via ORAL
  Filled 2016-12-19 (×3): qty 1

## 2016-12-19 NOTE — Progress Notes (Signed)
Subjective: Patient is doing better. His breathing has improved. No chest pain, cough or nausea or vomiting. Objective: Vital signs in last 24 hours: Temp:  [97.8 F (36.6 C)-98.8 F (37.1 C)] 98.6 F (37 C) (01/12 0800) Pulse Rate:  [71-90] 73 (01/12 0700) Resp:  [12-22] 12 (01/12 0700) BP: (113-138)/(41-57) 121/48 (01/12 0700) SpO2:  [95 %-100 %] 98 % (01/12 0700) Weight:  [119.2 kg (262 lb 12.6 oz)] 119.2 kg (262 lb 12.6 oz) (01/12 0457) Weight change: 0.9 kg (1 lb 15.7 oz) Last BM Date: 12/16/16  Intake/Output from previous day: 01/11 0701 - 01/12 0700 In: 720 [P.O.:720] Out: 3150 [Urine:3150]  PHYSICAL EXAM General appearance: no distress and morbidly obese Resp: diminished breath sounds bilaterally and rhonchi bilaterally Cardio: S1, S2 normal GI: soft, non-tender; bowel sounds normal; no masses,  no organomegaly Extremities: 1+ leg edema  Lab Results:  Results for orders placed or performed during the hospital encounter of 12/15/16 (from the past 48 hour(s))  Basic metabolic panel     Status: Abnormal   Collection Time: 12/18/16 12:20 PM  Result Value Ref Range   Sodium 133 (L) 135 - 145 mmol/L   Potassium 4.1 3.5 - 5.1 mmol/L   Chloride 95 (L) 101 - 111 mmol/L   CO2 27 22 - 32 mmol/L   Glucose, Bld 132 (H) 65 - 99 mg/dL   BUN 109 (H) 6 - 20 mg/dL    Comment: RESULTS CONFIRMED BY MANUAL DILUTION   Creatinine, Ser 2.66 (H) 0.61 - 1.24 mg/dL   Calcium 8.8 (L) 8.9 - 10.3 mg/dL   GFR calc non Af Amer 22 (L) >60 mL/min   GFR calc Af Amer 26 (L) >60 mL/min    Comment: (NOTE) The eGFR has been calculated using the CKD EPI equation. This calculation has not been validated in all clinical situations. eGFR's persistently <60 mL/min signify possible Chronic Kidney Disease.    Anion gap 11 5 - 15    ABGS No results for input(s): PHART, PO2ART, TCO2, HCO3 in the last 72 hours.  Invalid input(s): PCO2 CULTURES Recent Results (from the past 240 hour(s))  MRSA PCR  Screening     Status: None   Collection Time: 12/16/16 11:50 PM  Result Value Ref Range Status   MRSA by PCR NEGATIVE NEGATIVE Final    Comment:        The GeneXpert MRSA Assay (FDA approved for NASAL specimens only), is one component of a comprehensive MRSA colonization surveillance program. It is not intended to diagnose MRSA infection nor to guide or monitor treatment for MRSA infections.    Studies/Results: Ct Chest Wo Contrast  Result Date: 12/17/2016 CLINICAL DATA:  Chest pain EXAM: CT CHEST WITHOUT CONTRAST TECHNIQUE: Multidetector CT imaging of the chest was performed following the standard protocol without IV contrast. COMPARISON:  Chest x-ray 12/15/2016 FINDINGS: Cardiovascular: There is cardiomegaly. Scattered coronary artery and aortic calcifications. 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 Ascending aortic maximum diameter 3.8 cm. Mediastinum/Nodes: No mediastinal, hilar, or axillary adenopathy. Lungs/Pleura: Trace bilateral pleural effusions. Dependent atelectasis posteriorly in the lower lobes. Upper Abdomen: Imaging into the upper abdomen shows no acute findings. Musculoskeletal: Chest wall soft tissues are unremarkable. No acute bony abnormality or focal bone lesion. IMPRESSION: Moderate dilatation of the mid to distal aortic arch measured up to 4.8 cm. Recommend semi-annual imaging followup by CTA or MRA  and referral to cardiothoracic surgery if not already obtained. This recommendation follows 2010 ACCF/AHA/AATS/ACR/ASA/SCA/SCAI/SIR/STS/SVM Guidelines for the Diagnosis and Management of Patients With Thoracic Aortic Disease. Circulation. 2010; 121: e266-e36 Scattered coronary artery calcifications. Trace bilateral effusions with dependent atelectasis. Electronically Signed   By: Rolm Baptise M.D.   On: 12/17/2016 10:29   Dg Abd Portable 1v  Result Date:  12/17/2016 CLINICAL DATA:  COPD.  Prior history of pain. EXAM: PORTABLE ABDOMEN - 1 VIEW COMPARISON:  CT 07/20/2016. FINDINGS: Soft tissue structures are unremarkable. Air-filled loops of small and large bowel are noted. Adynamic ileus cannot be excluded. Follow-up abdominal series suggested demonstrate clearing. Surgical clips right abdomen. No acute bony abnormality . IMPRESSION: Air-filled loops of small and large bowel are noted. Mild adynamic ileus cannot be excluded. Follow-up exam suggested to demonstrate clearing. Electronically Signed   By: Marcello Moores  Register   On: 12/17/2016 08:55    Medications: I have reviewed the patient's current medications.  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)   Morbid obesity due to excess calories (HCC)   Acute on chronic diastolic CHF (congestive heart failure) (HCC)   Acute on chronic respiratory failure (HCC) chest pain   Plan:  Medications reviewed Continue nebulizer and steroid Will move patient to the floor Will monitor CBC BMP Continue current treatment.    LOS: 3 days   Cynia Abruzzo 12/19/2016, 8:09 AM

## 2016-12-19 NOTE — Progress Notes (Signed)
Subjective: He says he feels much better. He's not complaining of any shortness of breath now. He's not having any chest discomfort but he does complain of pain in his right shoulder and he wants to know if he can get some sort of a steroid injection. No other new complaints. He's not coughing. No hemoptysis. He's not swelling now. No nausea or vomiting  Objective: Vital signs in last 24 hours: Temp:  [97.8 F (36.6 C)-98.8 F (37.1 C)] 98.8 F (37.1 C) (01/12 0400) Pulse Rate:  [71-90] 79 (01/12 0600) Resp:  [12-22] 17 (01/12 0600) BP: (113-138)/(41-57) 113/41 (01/12 0600) SpO2:  [91 %-100 %] 95 % (01/12 0600) Weight:  [119.2 kg (262 lb 12.6 oz)] 119.2 kg (262 lb 12.6 oz) (01/12 0457) Weight change: 0.9 kg (1 lb 15.7 oz) Last BM Date: 12/16/16  Intake/Output from previous day: 01/11 0701 - 01/12 0700 In: 720 [P.O.:720] Out: 3150 [Urine:3150]  PHYSICAL EXAM General appearance: alert, cooperative and no distress Resp: clear to auscultation bilaterally Cardio: Heart is regular. He has a systolic murmur. I don't hear a diastolic murmur but his heart sounds are somewhat distant GI: soft, non-tender; bowel sounds normal; no masses,  no organomegaly Extremities: venous stasis dermatitis noted Skin warm and dry. Pupils react. Mucous membranes are moist  Lab Results:  Results for orders placed or performed during the hospital encounter of 12/15/16 (from the past 48 hour(s))  Basic metabolic panel     Status: Abnormal   Collection Time: 12/18/16 12:20 PM  Result Value Ref Range   Sodium 133 (L) 135 - 145 mmol/L   Potassium 4.1 3.5 - 5.1 mmol/L   Chloride 95 (L) 101 - 111 mmol/L   CO2 27 22 - 32 mmol/L   Glucose, Bld 132 (H) 65 - 99 mg/dL   BUN 109 (H) 6 - 20 mg/dL    Comment: RESULTS CONFIRMED BY MANUAL DILUTION   Creatinine, Ser 2.66 (H) 0.61 - 1.24 mg/dL   Calcium 8.8 (L) 8.9 - 10.3 mg/dL   GFR calc non Af Amer 22 (L) >60 mL/min   GFR calc Af Amer 26 (L) >60 mL/min    Comment:  (NOTE) The eGFR has been calculated using the CKD EPI equation. This calculation has not been validated in all clinical situations. eGFR's persistently <60 mL/min signify possible Chronic Kidney Disease.    Anion gap 11 5 - 15    ABGS No results for input(s): PHART, PO2ART, TCO2, HCO3 in the last 72 hours.  Invalid input(s): PCO2 CULTURES Recent Results (from the past 240 hour(s))  MRSA PCR Screening     Status: None   Collection Time: 12/16/16 11:50 PM  Result Value Ref Range Status   MRSA by PCR NEGATIVE NEGATIVE Final    Comment:        The GeneXpert MRSA Assay (FDA approved for NASAL specimens only), is one component of a comprehensive MRSA colonization surveillance program. It is not intended to diagnose MRSA infection nor to guide or monitor treatment for MRSA infections.    Studies/Results: Ct Chest Wo Contrast  Result Date: 12/17/2016 CLINICAL DATA:  Chest pain EXAM: CT CHEST WITHOUT CONTRAST TECHNIQUE: Multidetector CT imaging of the chest was performed following the standard protocol without IV contrast. COMPARISON:  Chest x-ray 12/15/2016 FINDINGS: Cardiovascular: There is cardiomegaly. Scattered coronary artery and aortic calcifications. 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  Ascending aortic maximum diameter 3.8 cm. Mediastinum/Nodes: No mediastinal, hilar, or axillary adenopathy. Lungs/Pleura: Trace bilateral pleural effusions. Dependent atelectasis posteriorly in the lower lobes. Upper Abdomen: Imaging into the upper abdomen shows no acute findings. Musculoskeletal: Chest wall soft tissues are unremarkable. No acute bony abnormality or focal bone lesion. IMPRESSION: Moderate dilatation of the mid to distal aortic arch measured up to 4.8 cm. Recommend semi-annual imaging followup by CTA or MRA and referral to cardiothoracic surgery if not already  obtained. This recommendation follows 2010 ACCF/AHA/AATS/ACR/ASA/SCA/SCAI/SIR/STS/SVM Guidelines for the Diagnosis and Management of Patients With Thoracic Aortic Disease. Circulation. 2010; 121: [id number] Scattered coronary artery calcifications. Trace bilateral effusions with dependent atelectasis. Electronically Signed   By: Rolm Baptise M.D.   On: 12/17/2016 10:29   Dg Abd Portable 1v  Result Date: 12/17/2016 CLINICAL DATA:  COPD.  Prior history of pain. EXAM: PORTABLE ABDOMEN - 1 VIEW COMPARISON:  CT 07/20/2016. FINDINGS: Soft tissue structures are unremarkable. Air-filled loops of small and large bowel are noted. Adynamic ileus cannot be excluded. Follow-up abdominal series suggested demonstrate clearing. Surgical clips right abdomen. No acute bony abnormality . IMPRESSION: Air-filled loops of small and large bowel are noted. Mild adynamic ileus cannot be excluded. Follow-up exam suggested to demonstrate clearing. Electronically Signed   By: Marcello Moores  Register   On: 12/17/2016 08:55    Medications:  Prior to Admission:  Prescriptions Prior to Admission  Medication Sig Dispense Refill Last Dose  . amLODipine (NORVASC) 5 MG tablet TAKE ONE TABLET BY MOUTH DAILY. 30 tablet 6 Past Week at Unknown time  . calcitRIOL (ROCALTROL) 0.5 MCG capsule Take 0.5 mcg by mouth daily.   Past Week at Unknown time  . cloNIDine (CATAPRES) 0.2 MG tablet Take 0.2 mg by mouth 3 (three) times daily.   Past Week at Unknown time  . desmopressin (DDAVP) 0.2 MG tablet Take 1 tablet (0.2 mg total) by mouth 2 (two) times daily. 60 tablet 12 Past Week at Unknown time  . Dexamethasone 1.5 MG (27) TBPK Take 1.5 mg by mouth daily with breakfast. 30 each 12 Past Week at Unknown time  . isosorbide-hydrALAZINE (BIDIL) 20-37.5 MG tablet Take 1 tablet by mouth 3 (three) times daily. 90 tablet 3 Past Week at Unknown time  . levothyroxine (SYNTHROID, LEVOTHROID) 150 MCG tablet Take 1 tablet (150 mcg total) by mouth daily before  breakfast. 30 tablet 12 Past Week at Unknown time  . metolazone (ZAROXOLYN) 5 MG tablet Take 5 mg by mouth daily.   Past Week at Unknown time  . phenytoin (DILANTIN) 100 MG ER capsule Take 100 mg by mouth 3 (three) times daily.   Past Week at Unknown time  . polyethylene glycol powder (GLYCOLAX/MIRALAX) powder Take 17 g by mouth daily.    Past Week at Unknown time  . Potassium Chloride Crys ER (KLOR-CON M20 PO) Take 20 mEq by mouth 2 (two) times daily.    Past Week at Unknown time  . simvastatin (ZOCOR) 20 MG tablet Take 20 mg by mouth daily.   Past Week at Unknown time  . torsemide (DEMADEX) 20 MG tablet Take 40 mg by mouth 2 (two) times daily.    Past Week at Unknown time   Scheduled: . amLODipine  5 mg Oral Daily  . calcitRIOL  0.5 mcg Oral Daily  . cloNIDine  0.2 mg Oral TID  . desmopressin  0.2 mg Oral BID  . heparin  5,000 Units Subcutaneous Q8H  . ipratropium-albuterol  3 mL Nebulization TID  .  isosorbide-hydrALAZINE  1 tablet Oral TID  . levothyroxine  150 mcg Oral QAC breakfast  . methylPREDNISolone (SOLU-MEDROL) injection  40 mg Intravenous Q12H  . metolazone  5 mg Oral Daily  . phenytoin  300 mg Oral Daily  . polyethylene glycol  17 g Oral Daily  . potassium chloride SA  20 mEq Oral Daily  . sodium chloride flush  3 mL Intravenous Q12H  . torsemide  20 mg Oral Daily   Continuous:  WUJ:[id number], HYDROcodone-acetaminophen, MUSCLE RUB, nitroGLYCERIN  Assesment: He was admitted with acute on chronic respiratory failure with COPD exacerbation and some element of acute on chronic diastolic heart failure although his heart failure seems to have been better controlled. His situation is complicated by the fact that he has sleep apnea and despite all efforts is noncompliant with CPAP. He's much better. He says he feels about back to baseline. 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    LOS: 3 days   Worth Kober L 12/19/2016, 7:50 AM

## 2016-12-20 LAB — BASIC METABOLIC PANEL
Anion gap: 9 (ref 5–15)
BUN: 105 mg/dL — ABNORMAL HIGH (ref 6–20)
CHLORIDE: 94 mmol/L — AB (ref 101–111)
CO2: 29 mmol/L (ref 22–32)
Calcium: 8.9 mg/dL (ref 8.9–10.3)
Creatinine, Ser: 2.65 mg/dL — ABNORMAL HIGH (ref 0.61–1.24)
GFR calc Af Amer: 26 mL/min — ABNORMAL LOW (ref >60)
GFR calc non Af Amer: 23 mL/min — ABNORMAL LOW (ref >60)
GLUCOSE: 96 mg/dL (ref 65–99)
POTASSIUM: 4.2 mmol/L (ref 3.5–5.1)
Sodium: 132 mmol/L — ABNORMAL LOW (ref 135–145)

## 2016-12-20 LAB — CBC
HEMATOCRIT: 33.1 % — AB (ref 39.0–52.0)
Hemoglobin: 11 g/dL — ABNORMAL LOW (ref 13.0–17.0)
MCH: 31.2 pg (ref 26.0–34.0)
MCHC: 33.2 g/dL (ref 30.0–36.0)
MCV: 93.8 fL (ref 78.0–100.0)
Platelets: 173 10*3/uL (ref 150–400)
RBC: 3.53 MIL/uL — ABNORMAL LOW (ref 4.22–5.81)
RDW: 15.7 % — ABNORMAL HIGH (ref 11.5–15.5)
WBC: 7.3 10*3/uL (ref 4.0–10.5)

## 2016-12-20 MED ORDER — PREDNISONE 20 MG PO TABS
40.0000 mg | ORAL_TABLET | Freq: Every day | ORAL | Status: DC
Start: 2016-12-21 — End: 2016-12-21
  Administered 2016-12-21: 40 mg via ORAL
  Filled 2016-12-20: qty 2

## 2016-12-20 NOTE — Progress Notes (Signed)
Subjective: Patient is sitting up. His breathing is improving. No chest pain, cough or shortness of breath. No fever or chills..   Objective: Vital signs in last 24 hours: Temp:  [97.5 F (36.4 C)-97.9 F (36.6 C)] 97.5 F (36.4 C) (01/13 0630) Pulse Rate:  [71-85] 72 (01/13 0630) Resp:  [12-18] 18 (01/13 0630) BP: (114-128)/(42-44) 128/44 (01/13 0630) SpO2:  [94 %-100 %] 94 % (01/13 0809) Weight:  [118.8 kg (261 lb 14.5 oz)] 118.8 kg (261 lb 14.5 oz) (01/13 0630) Weight change: -0.4 kg (-14.1 oz) Last BM Date: 12/18/16  Intake/Output from previous day: 01/12 0701 - 01/13 0700 In: 363 [P.O.:360; I.V.:3] Out: 1900 [Urine:1900]  PHYSICAL EXAM General appearance: no distress and morbidly obese Resp: diminished breath sounds bilaterally and rhonchi bilaterally Cardio: S1, S2 normal GI: soft, non-tender; bowel sounds normal; no masses,  no organomegaly Extremities: 1+ leg edema  Lab Results:  Results for orders placed or performed during the hospital encounter of 12/15/16 (from the past 48 hour(s))  Basic metabolic panel     Status: Abnormal   Collection Time: 12/18/16 12:20 PM  Result Value Ref Range   Sodium 133 (L) 135 - 145 mmol/L   Potassium 4.1 3.5 - 5.1 mmol/L   Chloride 95 (L) 101 - 111 mmol/L   CO2 27 22 - 32 mmol/L   Glucose, Bld 132 (H) 65 - 99 mg/dL   BUN 109 (H) 6 - 20 mg/dL    Comment: RESULTS CONFIRMED BY MANUAL DILUTION   Creatinine, Ser 2.66 (H) 0.61 - 1.24 mg/dL   Calcium 8.8 (L) 8.9 - 10.3 mg/dL   GFR calc non Af Amer 22 (L) >60 mL/min   GFR calc Af Amer 26 (L) >60 mL/min    Comment: (NOTE) The eGFR has been calculated using the CKD EPI equation. This calculation has not been validated in all clinical situations. eGFR's persistently <60 mL/min signify possible Chronic Kidney Disease.    Anion gap 11 5 - 15  Basic metabolic panel     Status: Abnormal   Collection Time: 12/20/16  6:41 AM  Result Value Ref Range   Sodium 132 (L) 135 - 145 mmol/L   Potassium 4.2 3.5 - 5.1 mmol/L   Chloride 94 (L) 101 - 111 mmol/L   CO2 29 22 - 32 mmol/L   Glucose, Bld 96 65 - 99 mg/dL   BUN 105 (H) 6 - 20 mg/dL    Comment: RESULTS CONFIRMED BY MANUAL DILUTION   Creatinine, Ser 2.65 (H) 0.61 - 1.24 mg/dL   Calcium 8.9 8.9 - 10.3 mg/dL   GFR calc non Af Amer 23 (L) >60 mL/min   GFR calc Af Amer 26 (L) >60 mL/min    Comment: (NOTE) The eGFR has been calculated using the CKD EPI equation. This calculation has not been validated in all clinical situations. eGFR's persistently <60 mL/min signify possible Chronic Kidney Disease.    Anion gap 9 5 - 15  CBC     Status: Abnormal   Collection Time: 12/20/16  6:41 AM  Result Value Ref Range   WBC 7.3 4.0 - 10.5 K/uL   RBC 3.53 (L) 4.22 - 5.81 MIL/uL   Hemoglobin 11.0 (L) 13.0 - 17.0 g/dL   HCT 33.1 (L) 39.0 - 52.0 %   MCV 93.8 78.0 - 100.0 fL   MCH 31.2 26.0 - 34.0 pg   MCHC 33.2 30.0 - 36.0 g/dL   RDW 15.7 (H) 11.5 - 15.5 %   Platelets 173 150 -  400 K/uL    ABGS No results for input(s): PHART, PO2ART, TCO2, HCO3 in the last 72 hours.  Invalid input(s): PCO2 CULTURES Recent Results (from the past 240 hour(s))  MRSA PCR Screening     Status: None   Collection Time: 12/16/16 11:50 PM  Result Value Ref Range Status   MRSA by PCR NEGATIVE NEGATIVE Final    Comment:        The GeneXpert MRSA Assay (FDA approved for NASAL specimens only), is one component of a comprehensive MRSA colonization surveillance program. It is not intended to diagnose MRSA infection nor to guide or monitor treatment for MRSA infections.    Studies/Results: No results found.  Medications: I have reviewed the patient's current medications.  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

## 2016-12-20 NOTE — Progress Notes (Signed)
[name] substantially better. He has transferred to the floor. I will plan to sign off at this point. Thanks for allowing me to see him with you

## 2016-12-21 MED ORDER — PREDNISONE 10 MG (21) PO TBPK: ORAL_TABLET | ORAL | 0 refills | Status: DC

## 2016-12-21 NOTE — Progress Notes (Signed)
Pt IV removed,WNL. D/C instructions given to family. Verbalized understanding. Family member present to take home.

## 2016-12-21 NOTE — Discharge Summary (Signed)
Physician Discharge Summary  Patient ID: [name] [name] MRN: [id number] DOB/AGE: September 19, 1946 71 y.o. 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   calcitRIOL 0.5 MCG capsule Commonly known as:  ROCALTROL Take 0.5 mcg by mouth daily.   cloNIDine 0.2 MG tablet Commonly known as:  CATAPRES Take 0.2 mg by mouth 3 (three) times daily.   desmopressin 0.2 MG tablet Commonly known as:  DDAVP Take 1 tablet (0.2 mg total) by mouth 2 (two) times daily.   Dexamethasone 1.5 MG (27) Tbpk Take 1.5 mg by mouth daily with breakfast.   isosorbide-hydrALAZINE 20-37.5 MG tablet Commonly known as:  BIDIL Take 1 tablet by mouth 3 (three) times daily.   KLOR-CON M20 PO Take 20 mEq by mouth 2 (two) times daily.   levothyroxine 150 MCG tablet Commonly known as:  SYNTHROID, LEVOTHROID Take 1 tablet (150 mcg total) by mouth daily before breakfast.   metolazone 5 MG tablet Commonly known as:  ZAROXOLYN Take 5 mg by mouth daily.   phenytoin 100 MG ER capsule Commonly known as:  DILANTIN Take 100 mg by mouth 3 (three) times daily.   polyethylene glycol powder powder Commonly known as:  GLYCOLAX/MIRALAX Take 17 g by mouth daily.   predniSONE 10 MG (21) Tbpk  tablet Commonly known as:  STERAPRED UNI-PAK 21 TAB 40 mg po daily for 3  Days, 30 mg po daily for 3 days, 2 tab po daily for 3 days, 1 tab po daily for 3 days   simvastatin 20 MG tablet Commonly known as:  ZOCOR Take 20 mg by mouth daily.   torsemide 20 MG tablet Commonly known as:  DEMADEX Take 40 mg by mouth 2 (two) times daily.       Discharged Condition: improved    Consults: pulmonary  Significant Diagnostic Studies: Dg Chest 1 View  Result Date: 12/15/2016 CLINICAL DATA:  Sudden shortness of breath EXAM: CHEST 1 VIEW COMPARISON:  11/03/2016 FINDINGS: There is cardiomegaly without overt failure. No acute consolidation or effusion. Low lung volumes. Stable mediastinal contour. No pneumothorax. IMPRESSION: Cardiomegaly without overt failure.  Low lung volumes. Electronically Signed   By: Donavan Foil M.D.   On: 12/15/2016 23:39   Ct Chest Wo Contrast  Result Date: 12/17/2016 CLINICAL DATA:  Chest pain EXAM: CT CHEST WITHOUT CONTRAST TECHNIQUE: Multidetector CT imaging of the chest was performed following the standard protocol without IV contrast. COMPARISON:  Chest x-ray 12/15/2016 FINDINGS: Cardiovascular: There is cardiomegaly. Scattered coronary artery and aortic calcifications. 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 Ascending aortic maximum diameter 3.8 cm. Mediastinum/Nodes: No mediastinal, hilar, or axillary  adenopathy. Lungs/Pleura: Trace bilateral pleural effusions. Dependent atelectasis posteriorly in the lower lobes. Upper Abdomen: Imaging into the upper abdomen shows no acute findings. Musculoskeletal: Chest wall soft tissues are unremarkable. No acute bony abnormality or focal bone lesion. IMPRESSION: Moderate dilatation of the mid to distal aortic arch measured up to 4.8 cm. Recommend semi-annual imaging followup by CTA or MRA and referral to  cardiothoracic surgery if not already obtained. This recommendation follows 2010 ACCF/AHA/AATS/ACR/ASA/SCA/SCAI/SIR/STS/SVM Guidelines for the Diagnosis and Management of Patients With Thoracic Aortic Disease. Circulation. 2010; 121: e266-e36 Scattered coronary artery calcifications. Trace bilateral effusions with dependent atelectasis. Electronically Signed   By: Rolm Baptise M.D.   On: 12/17/2016 10:29   Dg Abd Portable 1v  Result Date: 12/17/2016 CLINICAL DATA:  COPD.  Prior history of pain. EXAM: PORTABLE ABDOMEN - 1 VIEW COMPARISON:  CT 07/20/2016. FINDINGS: Soft tissue structures are unremarkable. Air-filled loops of small and large bowel are noted. Adynamic ileus cannot be excluded. Follow-up abdominal series suggested demonstrate clearing. Surgical clips right abdomen. No acute bony abnormality . IMPRESSION: Air-filled loops of small and large bowel are noted. Mild adynamic ileus cannot be excluded. Follow-up exam suggested to demonstrate clearing. Electronically Signed   By: Marcello Moores  Register   On: 12/17/2016 08:55    Lab Results: Basic Metabolic Panel:  Recent Labs  12/18/16 1220 12/20/16 0641  NA 133* 132*  K 4.1 4.2  CL 95* 94*  CO2 27 29  GLUCOSE 132* 96  BUN 109* 105*  CREATININE 2.66* 2.65*  CALCIUM 8.8* 8.9   Liver Function Tests: No results for input(s): AST, ALT, ALKPHOS, BILITOT, PROT, ALBUMIN in the last 72 hours.   CBC:  Recent Labs  12/20/16 0641  WBC 7.3  HGB 11.0*  HCT 33.1*  MCV 93.8  PLT 173    Recent Results (from the past 240 hour(s))  MRSA PCR Screening     Status: None   Collection Time: 12/16/16 11:50 PM  Result Value Ref Range Status   MRSA by PCR NEGATIVE NEGATIVE Final    Comment:        The GeneXpert MRSA Assay (FDA approved for NASAL specimens only), is one component of a comprehensive MRSA colonization surveillance program. It is not intended to diagnose MRSA infection nor to guide or monitor treatment for MRSA infections.       Hospital Course:   This is a 71 years old male with history of multiple medical illnesses was admitted due to shortness of breath. He had acute on chronic respiratory failure and was admitted to ICU on BIPAP. Pulmonary consult was done who assisted in his management. After improved he was transfer to the floor. [name] now stable and back to his usual state. Patient is being discharged on oral steroid.  Discharge Exam: Blood pressure (!) 134/37, pulse 71, temperature 98.2 F (36.8 C), temperature source Oral, resp. rate 18, height 6\' 1"  (1.854 m), weight 116.7 kg (257 lb 3 oz), SpO2 96 %.   Disposition:  home    Follow-up Information    Joyann Spidle, MD Follow up in 2 week(s).   Specialty:  Internal Medicine Contact information: [address] [facility] [address] [phone]           Signed: [name]   12/21/2016, 12:16 PM

## 2017-01-01 ENCOUNTER — Ambulatory Visit (INDEPENDENT_AMBULATORY_CARE_PROVIDER_SITE_OTHER): Payer: Medicare HMO | Admitting: "Endocrinology

## 2017-01-01 ENCOUNTER — Encounter: Payer: Self-pay | Admitting: "Endocrinology

## 2017-01-01 VITALS — BP 148/55 | HR 72 | Ht 73.0 in | Wt 257.0 lb

## 2017-01-01 DIAGNOSIS — E230 Hypopituitarism: Secondary | ICD-10-CM | POA: Diagnosis not present

## 2017-01-01 DIAGNOSIS — I10 Essential (primary) hypertension: Secondary | ICD-10-CM | POA: Diagnosis not present

## 2017-01-01 MED ORDER — AMLODIPINE BESYLATE 10 MG PO TABS: 10.0000 mg | ORAL_TABLET | Freq: Every day | ORAL | 2 refills | Status: DC
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
## 2017-01-13 DIAGNOSIS — L03115 Cellulitis of right lower limb: Secondary | ICD-10-CM | POA: Diagnosis not present

## 2017-01-13 DIAGNOSIS — G9341 Metabolic encephalopathy: Secondary | ICD-10-CM | POA: Diagnosis not present

## 2017-01-13 DIAGNOSIS — N179 Acute kidney failure, unspecified: Secondary | ICD-10-CM | POA: Diagnosis not present

## 2017-01-13 DIAGNOSIS — I5042 Chronic combined systolic (congestive) and diastolic (congestive) heart failure: Secondary | ICD-10-CM | POA: Diagnosis not present

## 2017-01-13 DIAGNOSIS — I130 Hypertensive heart and chronic kidney disease with heart failure and stage 1 through stage 4 chronic kidney disease, or unspecified chronic kidney disease: Secondary | ICD-10-CM | POA: Diagnosis not present

## 2017-01-13 DIAGNOSIS — R079 Chest pain, unspecified: Secondary | ICD-10-CM | POA: Diagnosis not present

## 2017-01-13 DIAGNOSIS — E871 Hypo-osmolality and hyponatremia: Secondary | ICD-10-CM | POA: Diagnosis not present

## 2017-01-13 DIAGNOSIS — E039 Hypothyroidism, unspecified: Secondary | ICD-10-CM | POA: Diagnosis not present

## 2017-01-13 DIAGNOSIS — A419 Sepsis, unspecified organism: Secondary | ICD-10-CM | POA: Diagnosis not present

## 2017-01-13 DIAGNOSIS — M6281 Muscle weakness (generalized): Secondary | ICD-10-CM | POA: Diagnosis not present

## 2017-01-13 DIAGNOSIS — R7989 Other specified abnormal findings of blood chemistry: Secondary | ICD-10-CM | POA: Diagnosis not present

## 2017-01-13 DIAGNOSIS — R278 Other lack of coordination: Secondary | ICD-10-CM | POA: Diagnosis not present

## 2017-01-13 DIAGNOSIS — N184 Chronic kidney disease, stage 4 (severe): Secondary | ICD-10-CM | POA: Diagnosis not present

## 2017-01-13 DIAGNOSIS — R279 Unspecified lack of coordination: Secondary | ICD-10-CM | POA: Diagnosis not present

## 2017-01-13 DIAGNOSIS — G4730 Sleep apnea, unspecified: Secondary | ICD-10-CM | POA: Diagnosis not present

## 2017-01-13 DIAGNOSIS — I509 Heart failure, unspecified: Secondary | ICD-10-CM | POA: Diagnosis not present

## 2017-01-13 DIAGNOSIS — L03116 Cellulitis of left lower limb: Secondary | ICD-10-CM | POA: Diagnosis not present

## 2017-01-13 DIAGNOSIS — E230 Hypopituitarism: Secondary | ICD-10-CM | POA: Diagnosis not present

## 2017-01-13 DIAGNOSIS — E873 Alkalosis: Secondary | ICD-10-CM | POA: Diagnosis not present

## 2017-01-13 DIAGNOSIS — Z743 Need for continuous supervision: Secondary | ICD-10-CM | POA: Diagnosis not present

## 2017-01-13 DIAGNOSIS — E119 Type 2 diabetes mellitus without complications: Secondary | ICD-10-CM | POA: Diagnosis not present

## 2017-01-21 ENCOUNTER — Inpatient Hospital Stay
Admission: RE | Admit: 2017-01-21 | Discharge: 2017-01-22 | Disposition: A | Discharge status: Nursing Home (Includes ICF) | Payer: Medicare HMO | Source: Ambulatory Visit | Attending: Internal Medicine | Admitting: Internal Medicine

## 2017-01-21 DIAGNOSIS — E1122 Type 2 diabetes mellitus with diabetic chronic kidney disease: Secondary | ICD-10-CM | POA: Diagnosis not present

## 2017-01-21 DIAGNOSIS — I5033 Acute on chronic diastolic (congestive) heart failure: Secondary | ICD-10-CM | POA: Diagnosis not present

## 2017-01-21 DIAGNOSIS — L03116 Cellulitis of left lower limb: Secondary | ICD-10-CM | POA: Diagnosis not present

## 2017-01-21 DIAGNOSIS — G4730 Sleep apnea, unspecified: Secondary | ICD-10-CM | POA: Diagnosis not present

## 2017-01-21 DIAGNOSIS — G40909 Epilepsy, unspecified, not intractable, without status epilepticus: Secondary | ICD-10-CM | POA: Diagnosis not present

## 2017-01-21 DIAGNOSIS — R509 Fever, unspecified: Secondary | ICD-10-CM | POA: Diagnosis not present

## 2017-01-21 DIAGNOSIS — R279 Unspecified lack of coordination: Secondary | ICD-10-CM | POA: Diagnosis not present

## 2017-01-21 DIAGNOSIS — Z9119 Patient's noncompliance with other medical treatment and regimen: Secondary | ICD-10-CM | POA: Diagnosis not present

## 2017-01-21 DIAGNOSIS — D649 Anemia, unspecified: Secondary | ICD-10-CM | POA: Diagnosis not present

## 2017-01-21 DIAGNOSIS — I959 Hypotension, unspecified: Secondary | ICD-10-CM | POA: Diagnosis not present

## 2017-01-21 DIAGNOSIS — I8311 Varicose veins of right lower extremity with inflammation: Secondary | ICD-10-CM | POA: Diagnosis present

## 2017-01-21 DIAGNOSIS — L03119 Cellulitis of unspecified part of limb: Secondary | ICD-10-CM | POA: Diagnosis not present

## 2017-01-21 DIAGNOSIS — M6281 Muscle weakness (generalized): Secondary | ICD-10-CM | POA: Diagnosis not present

## 2017-01-21 DIAGNOSIS — Z6834 Body mass index (BMI) 34.0-34.9, adult: Secondary | ICD-10-CM | POA: Diagnosis not present

## 2017-01-21 DIAGNOSIS — J441 Chronic obstructive pulmonary disease with (acute) exacerbation: Secondary | ICD-10-CM | POA: Diagnosis not present

## 2017-01-21 DIAGNOSIS — Z87891 Personal history of nicotine dependence: Secondary | ICD-10-CM | POA: Diagnosis not present

## 2017-01-21 DIAGNOSIS — J9611 Chronic respiratory failure with hypoxia: Secondary | ICD-10-CM | POA: Diagnosis not present

## 2017-01-21 DIAGNOSIS — Z743 Need for continuous supervision: Secondary | ICD-10-CM | POA: Diagnosis not present

## 2017-01-21 DIAGNOSIS — Z85528 Personal history of other malignant neoplasm of kidney: Secondary | ICD-10-CM | POA: Diagnosis not present

## 2017-01-21 DIAGNOSIS — E038 Other specified hypothyroidism: Secondary | ICD-10-CM | POA: Diagnosis not present

## 2017-01-21 DIAGNOSIS — E230 Hypopituitarism: Secondary | ICD-10-CM | POA: Diagnosis not present

## 2017-01-21 DIAGNOSIS — N184 Chronic kidney disease, stage 4 (severe): Secondary | ICD-10-CM | POA: Diagnosis not present

## 2017-01-21 DIAGNOSIS — Z905 Acquired absence of kidney: Secondary | ICD-10-CM | POA: Diagnosis not present

## 2017-01-21 DIAGNOSIS — E119 Type 2 diabetes mellitus without complications: Secondary | ICD-10-CM | POA: Diagnosis not present

## 2017-01-21 DIAGNOSIS — R0602 Shortness of breath: Secondary | ICD-10-CM | POA: Diagnosis not present

## 2017-01-21 DIAGNOSIS — I4510 Unspecified right bundle-branch block: Secondary | ICD-10-CM | POA: Diagnosis present

## 2017-01-21 DIAGNOSIS — E039 Hypothyroidism, unspecified: Secondary | ICD-10-CM | POA: Diagnosis not present

## 2017-01-21 DIAGNOSIS — A419 Sepsis, unspecified organism: Secondary | ICD-10-CM | POA: Diagnosis not present

## 2017-01-21 DIAGNOSIS — R7989 Other specified abnormal findings of blood chemistry: Secondary | ICD-10-CM | POA: Diagnosis not present

## 2017-01-21 DIAGNOSIS — Z9981 Dependence on supplemental oxygen: Secondary | ICD-10-CM | POA: Diagnosis not present

## 2017-01-21 DIAGNOSIS — I8312 Varicose veins of left lower extremity with inflammation: Secondary | ICD-10-CM | POA: Diagnosis present

## 2017-01-21 DIAGNOSIS — E232 Diabetes insipidus: Secondary | ICD-10-CM | POA: Diagnosis not present

## 2017-01-21 DIAGNOSIS — R278 Other lack of coordination: Secondary | ICD-10-CM | POA: Diagnosis not present

## 2017-01-21 DIAGNOSIS — Z79899 Other long term (current) drug therapy: Secondary | ICD-10-CM | POA: Diagnosis not present

## 2017-01-21 DIAGNOSIS — I130 Hypertensive heart and chronic kidney disease with heart failure and stage 1 through stage 4 chronic kidney disease, or unspecified chronic kidney disease: Secondary | ICD-10-CM | POA: Diagnosis not present

## 2017-01-21 DIAGNOSIS — I5042 Chronic combined systolic (congestive) and diastolic (congestive) heart failure: Secondary | ICD-10-CM | POA: Diagnosis not present

## 2017-01-21 DIAGNOSIS — J449 Chronic obstructive pulmonary disease, unspecified: Secondary | ICD-10-CM | POA: Diagnosis not present

## 2017-01-21 DIAGNOSIS — Z8249 Family history of ischemic heart disease and other diseases of the circulatory system: Secondary | ICD-10-CM | POA: Diagnosis not present

## 2017-01-22 ENCOUNTER — Encounter (HOSPITAL_COMMUNITY)
Admission: RE | Admit: 2017-01-22 | Discharge: 2017-01-22 | Disposition: A | Discharge status: Home / Self Care | Payer: Medicare HMO | Source: Skilled Nursing Facility | Attending: Internal Medicine | Admitting: Internal Medicine

## 2017-01-22 ENCOUNTER — Other Ambulatory Visit: Payer: Self-pay

## 2017-01-22 ENCOUNTER — Non-Acute Institutional Stay (SKILLED_NURSING_FACILITY): Payer: Medicare HMO | Admitting: Internal Medicine

## 2017-01-22 ENCOUNTER — Encounter (HOSPITAL_COMMUNITY): Payer: Self-pay | Admitting: Emergency Medicine

## 2017-01-22 ENCOUNTER — Emergency Department (HOSPITAL_COMMUNITY): Payer: Medicare HMO

## 2017-01-22 ENCOUNTER — Other Ambulatory Visit: Payer: Self-pay | Admitting: *Deleted

## 2017-01-22 ENCOUNTER — Encounter: Payer: Self-pay | Admitting: Internal Medicine

## 2017-01-22 ENCOUNTER — Inpatient Hospital Stay (HOSPITAL_COMMUNITY)
Admission: EM | Admit: 2017-01-22 | Discharge: 2017-01-27 | DRG: 191 | Disposition: A | Discharge status: Skilled Nursing Facility | Payer: Medicare HMO | Attending: Internal Medicine | Admitting: Internal Medicine

## 2017-01-22 DIAGNOSIS — Z9119 Patient's noncompliance with other medical treatment and regimen: Secondary | ICD-10-CM | POA: Diagnosis not present

## 2017-01-22 DIAGNOSIS — I8311 Varicose veins of right lower extremity with inflammation: Secondary | ICD-10-CM | POA: Diagnosis present

## 2017-01-22 DIAGNOSIS — I4510 Unspecified right bundle-branch block: Secondary | ICD-10-CM | POA: Diagnosis present

## 2017-01-22 DIAGNOSIS — I5042 Chronic combined systolic (congestive) and diastolic (congestive) heart failure: Secondary | ICD-10-CM | POA: Insufficient documentation

## 2017-01-22 DIAGNOSIS — I959 Hypotension, unspecified: Secondary | ICD-10-CM | POA: Diagnosis not present

## 2017-01-22 DIAGNOSIS — Z79899 Other long term (current) drug therapy: Secondary | ICD-10-CM | POA: Diagnosis not present

## 2017-01-22 DIAGNOSIS — D649 Anemia, unspecified: Secondary | ICD-10-CM

## 2017-01-22 DIAGNOSIS — J441 Chronic obstructive pulmonary disease with (acute) exacerbation: Secondary | ICD-10-CM

## 2017-01-22 DIAGNOSIS — Z9981 Dependence on supplemental oxygen: Secondary | ICD-10-CM

## 2017-01-22 DIAGNOSIS — I10 Essential (primary) hypertension: Secondary | ICD-10-CM | POA: Diagnosis present

## 2017-01-22 DIAGNOSIS — Z87891 Personal history of nicotine dependence: Secondary | ICD-10-CM

## 2017-01-22 DIAGNOSIS — I5033 Acute on chronic diastolic (congestive) heart failure: Secondary | ICD-10-CM | POA: Diagnosis not present

## 2017-01-22 DIAGNOSIS — R509 Fever, unspecified: Secondary | ICD-10-CM | POA: Diagnosis not present

## 2017-01-22 DIAGNOSIS — Z85528 Personal history of other malignant neoplasm of kidney: Secondary | ICD-10-CM | POA: Diagnosis not present

## 2017-01-22 DIAGNOSIS — J9611 Chronic respiratory failure with hypoxia: Secondary | ICD-10-CM | POA: Diagnosis not present

## 2017-01-22 DIAGNOSIS — Z8249 Family history of ischemic heart disease and other diseases of the circulatory system: Secondary | ICD-10-CM | POA: Diagnosis not present

## 2017-01-22 DIAGNOSIS — E232 Diabetes insipidus: Secondary | ICD-10-CM | POA: Diagnosis not present

## 2017-01-22 DIAGNOSIS — I8312 Varicose veins of left lower extremity with inflammation: Secondary | ICD-10-CM | POA: Diagnosis present

## 2017-01-22 DIAGNOSIS — Z6834 Body mass index (BMI) 34.0-34.9, adult: Secondary | ICD-10-CM | POA: Diagnosis not present

## 2017-01-22 DIAGNOSIS — E1122 Type 2 diabetes mellitus with diabetic chronic kidney disease: Secondary | ICD-10-CM | POA: Diagnosis not present

## 2017-01-22 DIAGNOSIS — J42 Unspecified chronic bronchitis: Secondary | ICD-10-CM | POA: Diagnosis not present

## 2017-01-22 DIAGNOSIS — G4730 Sleep apnea, unspecified: Secondary | ICD-10-CM | POA: Diagnosis not present

## 2017-01-22 DIAGNOSIS — R778 Other specified abnormalities of plasma proteins: Secondary | ICD-10-CM

## 2017-01-22 DIAGNOSIS — R109 Unspecified abdominal pain: Secondary | ICD-10-CM

## 2017-01-22 DIAGNOSIS — I130 Hypertensive heart and chronic kidney disease with heart failure and stage 1 through stage 4 chronic kidney disease, or unspecified chronic kidney disease: Secondary | ICD-10-CM | POA: Diagnosis present

## 2017-01-22 DIAGNOSIS — N184 Chronic kidney disease, stage 4 (severe): Secondary | ICD-10-CM | POA: Diagnosis not present

## 2017-01-22 DIAGNOSIS — I509 Heart failure, unspecified: Secondary | ICD-10-CM | POA: Diagnosis not present

## 2017-01-22 DIAGNOSIS — E119 Type 2 diabetes mellitus without complications: Secondary | ICD-10-CM

## 2017-01-22 DIAGNOSIS — E230 Hypopituitarism: Secondary | ICD-10-CM | POA: Diagnosis present

## 2017-01-22 DIAGNOSIS — E038 Other specified hypothyroidism: Secondary | ICD-10-CM | POA: Diagnosis not present

## 2017-01-22 DIAGNOSIS — E039 Hypothyroidism, unspecified: Secondary | ICD-10-CM | POA: Diagnosis present

## 2017-01-22 DIAGNOSIS — G40909 Epilepsy, unspecified, not intractable, without status epilepticus: Secondary | ICD-10-CM | POA: Diagnosis not present

## 2017-01-22 DIAGNOSIS — R7989 Other specified abnormal findings of blood chemistry: Secondary | ICD-10-CM | POA: Diagnosis not present

## 2017-01-22 DIAGNOSIS — L03119 Cellulitis of unspecified part of limb: Secondary | ICD-10-CM

## 2017-01-22 DIAGNOSIS — R1084 Generalized abdominal pain: Secondary | ICD-10-CM | POA: Diagnosis not present

## 2017-01-22 DIAGNOSIS — D509 Iron deficiency anemia, unspecified: Secondary | ICD-10-CM | POA: Diagnosis not present

## 2017-01-22 DIAGNOSIS — Z905 Acquired absence of kidney: Secondary | ICD-10-CM | POA: Diagnosis not present

## 2017-01-22 DIAGNOSIS — J449 Chronic obstructive pulmonary disease, unspecified: Secondary | ICD-10-CM | POA: Diagnosis not present

## 2017-01-22 DIAGNOSIS — L03116 Cellulitis of left lower limb: Secondary | ICD-10-CM | POA: Diagnosis not present

## 2017-01-22 DIAGNOSIS — J9621 Acute and chronic respiratory failure with hypoxia: Secondary | ICD-10-CM | POA: Diagnosis not present

## 2017-01-22 DIAGNOSIS — J9610 Chronic respiratory failure, unspecified whether with hypoxia or hypercapnia: Secondary | ICD-10-CM | POA: Diagnosis not present

## 2017-01-22 DIAGNOSIS — R1013 Epigastric pain: Secondary | ICD-10-CM | POA: Diagnosis not present

## 2017-01-22 DIAGNOSIS — R0602 Shortness of breath: Secondary | ICD-10-CM | POA: Diagnosis not present

## 2017-01-22 HISTORY — DX: Dependence on supplemental oxygen: Z99.81

## 2017-01-22 LAB — CBC WITH DIFFERENTIAL/PLATELET
BASOS ABS: 0 10*3/uL (ref 0.0–0.1)
BASOS PCT: 0 %
EOS ABS: 0.7 10*3/uL (ref 0.0–0.7)
Eosinophils Relative: 8 %
HEMATOCRIT: 27.9 % — AB (ref 39.0–52.0)
HEMOGLOBIN: 9.3 g/dL — AB (ref 13.0–17.0)
Lymphocytes Relative: 14 %
Lymphs Abs: 1.2 10*3/uL (ref 0.7–4.0)
MCH: 30.5 pg (ref 26.0–34.0)
MCHC: 33.3 g/dL (ref 30.0–36.0)
MCV: 91.5 fL (ref 78.0–100.0)
Monocytes Absolute: 0.8 10*3/uL (ref 0.1–1.0)
Monocytes Relative: 9 %
NEUTROS ABS: 6.2 10*3/uL (ref 1.7–7.7)
NEUTROS PCT: 69 %
Platelets: 260 10*3/uL (ref 150–400)
RBC: 3.05 MIL/uL — AB (ref 4.22–5.81)
RDW: 14.7 % (ref 11.5–15.5)
WBC: 8.9 10*3/uL (ref 4.0–10.5)

## 2017-01-22 LAB — BASIC METABOLIC PANEL
Anion gap: 10 (ref 5–15)
BUN: 75 mg/dL — ABNORMAL HIGH (ref 6–20)
CHLORIDE: 106 mmol/L (ref 101–111)
CO2: 18 mmol/L — AB (ref 22–32)
CREATININE: 3.42 mg/dL — AB (ref 0.61–1.24)
Calcium: 8.9 mg/dL (ref 8.9–10.3)
GFR calc non Af Amer: 17 mL/min — ABNORMAL LOW (ref >60)
GFR, EST AFRICAN AMERICAN: 19 mL/min — AB (ref >60)
Glucose, Bld: 148 mg/dL — ABNORMAL HIGH (ref 65–99)
Potassium: 3.6 mmol/L (ref 3.5–5.1)
SODIUM: 134 mmol/L — AB (ref 135–145)

## 2017-01-22 LAB — CBC
HCT: 28.5 % — ABNORMAL LOW (ref 39.0–52.0)
HEMOGLOBIN: 9.5 g/dL — AB (ref 13.0–17.0)
MCH: 30.6 pg (ref 26.0–34.0)
MCHC: 33.3 g/dL (ref 30.0–36.0)
MCV: 91.9 fL (ref 78.0–100.0)
PLATELETS: 266 10*3/uL (ref 150–400)
RBC: 3.1 MIL/uL — ABNORMAL LOW (ref 4.22–5.81)
RDW: 14.8 % (ref 11.5–15.5)
WBC: 9.1 10*3/uL (ref 4.0–10.5)

## 2017-01-22 LAB — URINALYSIS, ROUTINE W REFLEX MICROSCOPIC
BILIRUBIN URINE: NEGATIVE
Glucose, UA: NEGATIVE mg/dL
Hgb urine dipstick: NEGATIVE
Ketones, ur: NEGATIVE mg/dL
LEUKOCYTES UA: NEGATIVE
NITRITE: NEGATIVE
PH: 5 (ref 5.0–8.0)
Protein, ur: NEGATIVE mg/dL
SPECIFIC GRAVITY, URINE: 1.014 (ref 1.005–1.030)

## 2017-01-22 LAB — COMPREHENSIVE METABOLIC PANEL
ALT: 36 U/L (ref 17–63)
ANION GAP: 13 (ref 5–15)
AST: 32 U/L (ref 15–41)
Albumin: 2.5 g/dL — ABNORMAL LOW (ref 3.5–5.0)
Alkaline Phosphatase: 62 U/L (ref 38–126)
BUN: 74 mg/dL — ABNORMAL HIGH (ref 6–20)
CHLORIDE: 107 mmol/L (ref 101–111)
CO2: 17 mmol/L — AB (ref 22–32)
CREATININE: 3.37 mg/dL — AB (ref 0.61–1.24)
Calcium: 8.8 mg/dL — ABNORMAL LOW (ref 8.9–10.3)
GFR calc Af Amer: 20 mL/min — ABNORMAL LOW (ref >60)
GFR, EST NON AFRICAN AMERICAN: 17 mL/min — AB (ref >60)
Glucose, Bld: 92 mg/dL (ref 65–99)
POTASSIUM: 4 mmol/L (ref 3.5–5.1)
SODIUM: 137 mmol/L (ref 135–145)
Total Bilirubin: 0.4 mg/dL (ref 0.3–1.2)
Total Protein: 7.2 g/dL (ref 6.5–8.1)

## 2017-01-22 LAB — TSH: TSH: 0.35 u[IU]/mL (ref 0.350–4.500)

## 2017-01-22 LAB — GLUCOSE, CAPILLARY
GLUCOSE-CAPILLARY: 231 mg/dL — AB (ref 65–99)
Glucose-Capillary: 157 mg/dL — ABNORMAL HIGH (ref 65–99)

## 2017-01-22 LAB — TROPONIN I: TROPONIN I: 0.04 ng/mL — AB (ref <0.03)

## 2017-01-22 LAB — LACTIC ACID, PLASMA: Lactic Acid, Venous: 1.1 mmol/L (ref 0.5–1.9)

## 2017-01-22 LAB — BRAIN NATRIURETIC PEPTIDE: B NATRIURETIC PEPTIDE 5: 109 pg/mL — AB (ref 0.0–100.0)

## 2017-01-22 LAB — MRSA PCR SCREENING: MRSA BY PCR: NEGATIVE

## 2017-01-22 LAB — POC OCCULT BLOOD, ED: FECAL OCCULT BLD: NEGATIVE

## 2017-01-22 LAB — INFLUENZA PANEL BY PCR (TYPE A & B)
INFLAPCR: NEGATIVE
INFLBPCR: NEGATIVE

## 2017-01-22 LAB — PHENYTOIN LEVEL, TOTAL

## 2017-01-22 MED ORDER — HYDROCODONE-ACETAMINOPHEN 5-325 MG PO TABS
1.0000 | ORAL_TABLET | Freq: Four times a day (QID) | ORAL | Status: DC | PRN
  Administered 2017-01-24 – 2017-01-27 (×7): 1 via ORAL
  Filled 2017-01-22 (×8): qty 1

## 2017-01-22 MED ORDER — IPRATROPIUM BROMIDE 0.02 % IN SOLN
1.0000 mg | Freq: Once | RESPIRATORY_TRACT | Status: AC
  Administered 2017-01-22: 1 mg via RESPIRATORY_TRACT
  Filled 2017-01-22: qty 5

## 2017-01-22 MED ORDER — PHENYTOIN SODIUM EXTENDED 100 MG PO CAPS
100.0000 mg | ORAL_CAPSULE | Freq: Three times a day (TID) | ORAL | Status: DC
  Administered 2017-01-22 – 2017-01-27 (×15): 100 mg via ORAL
  Filled 2017-01-22 (×15): qty 1

## 2017-01-22 MED ORDER — DESMOPRESSIN ACETATE 0.2 MG PO TABS
0.2000 mg | ORAL_TABLET | Freq: Two times a day (BID) | ORAL | Status: DC
  Administered 2017-01-22 – 2017-01-27 (×10): 0.2 mg via ORAL
  Filled 2017-01-22 (×12): qty 1

## 2017-01-22 MED ORDER — DEXAMETHASONE 4 MG PO TABS
2.0000 mg | ORAL_TABLET | Freq: Every day | ORAL | Status: DC
  Administered 2017-01-23 – 2017-01-25 (×3): 2 mg via ORAL
  Filled 2017-01-22 (×3): qty 0.5
  Filled 2017-01-22: qty 1

## 2017-01-22 MED ORDER — IPRATROPIUM-ALBUTEROL 0.5-2.5 (3) MG/3ML IN SOLN
3.0000 mL | Freq: Four times a day (QID) | RESPIRATORY_TRACT | Status: DC
  Administered 2017-01-22 – 2017-01-27 (×18): 3 mL via RESPIRATORY_TRACT
  Filled 2017-01-22 (×19): qty 3

## 2017-01-22 MED ORDER — HEPARIN SODIUM (PORCINE) 5000 UNIT/ML IJ SOLN
5000.0000 [IU] | Freq: Three times a day (TID) | INTRAMUSCULAR | Status: DC
  Administered 2017-01-22 – 2017-01-27 (×14): 5000 [IU] via SUBCUTANEOUS
  Filled 2017-01-22 (×15): qty 1

## 2017-01-22 MED ORDER — CALCITRIOL 0.25 MCG PO CAPS
0.5000 ug | ORAL_CAPSULE | Freq: Every day | ORAL | Status: DC
  Administered 2017-01-22 – 2017-01-27 (×6): 0.5 ug via ORAL
  Filled 2017-01-22 (×6): qty 2

## 2017-01-22 MED ORDER — INSULIN ASPART 100 UNIT/ML ~~LOC~~ SOLN
0.0000 [IU] | Freq: Three times a day (TID) | SUBCUTANEOUS | Status: DC
  Administered 2017-01-22: 2 [IU] via SUBCUTANEOUS
  Administered 2017-01-23 – 2017-01-25 (×5): 1 [IU] via SUBCUTANEOUS

## 2017-01-22 MED ORDER — METHYLPREDNISOLONE SODIUM SUCC 125 MG IJ SOLR
125.0000 mg | Freq: Once | INTRAMUSCULAR | Status: AC
  Administered 2017-01-22: 125 mg via INTRAVENOUS
  Filled 2017-01-22: qty 2

## 2017-01-22 MED ORDER — HYDROCODONE-ACETAMINOPHEN 5-325 MG PO TABS: ORAL_TABLET | ORAL | 0 refills | Status: DC

## 2017-01-22 MED ORDER — ALBUTEROL (5 MG/ML) CONTINUOUS INHALATION SOLN
10.0000 mg/h | INHALATION_SOLUTION | Freq: Once | RESPIRATORY_TRACT | Status: AC
  Administered 2017-01-22: 10 mg/h via RESPIRATORY_TRACT
  Filled 2017-01-22: qty 20

## 2017-01-22 MED ORDER — ISOSORB DINITRATE-HYDRALAZINE 20-37.5 MG PO TABS
1.0000 | ORAL_TABLET | Freq: Three times a day (TID) | ORAL | Status: DC
  Administered 2017-01-22 – 2017-01-27 (×15): 1 via ORAL
  Filled 2017-01-22 (×18): qty 1

## 2017-01-22 MED ORDER — LEVOTHYROXINE SODIUM 75 MCG PO TABS
150.0000 ug | ORAL_TABLET | Freq: Every day | ORAL | Status: DC
  Administered 2017-01-23 – 2017-01-27 (×5): 150 ug via ORAL
  Filled 2017-01-22 (×5): qty 2

## 2017-01-22 MED ORDER — TORSEMIDE 20 MG PO TABS
40.0000 mg | ORAL_TABLET | Freq: Two times a day (BID) | ORAL | Status: DC
  Administered 2017-01-22 – 2017-01-27 (×10): 40 mg via ORAL
  Filled 2017-01-22 (×11): qty 2

## 2017-01-22 MED ORDER — ALBUTEROL SULFATE (2.5 MG/3ML) 0.083% IN NEBU
2.5000 mg | INHALATION_SOLUTION | RESPIRATORY_TRACT | Status: DC | PRN
  Administered 2017-01-23 – 2017-01-27 (×4): 2.5 mg via RESPIRATORY_TRACT
  Filled 2017-01-22 (×4): qty 3

## 2017-01-22 NOTE — ED Notes (Signed)
Pt tolerating neb at this time. States improvement in breathing.
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
## 2017-01-22 NOTE — ED Triage Notes (Signed)
Pt sent to ED by [facility] for SOB/COPD/fever and loose stools.
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
## 2017-01-22 NOTE — Telephone Encounter (Signed)
[facility] #[phone] Fax: [phone]   

## 2017-01-22 NOTE — ED Notes (Signed)
CRITICAL VALUE ALERT  Critical value received:  Troponin 0.04  Date of notification:  01/22/17  Time of notification:  [id number]  Critical value read back:yes  Nurse who received alert:  Tilden Fossa RN  MD notified (1st page):  Thurnell Garbe  Time of first page:  1340  MD notified (2nd page):  Time of second page:  Responding MD:  Thurnell Garbe  Time MD responded:  1340

## 2017-01-22 NOTE — ED Notes (Signed)
Report given to floor at this time.

## 2017-01-23 ENCOUNTER — Inpatient Hospital Stay (HOSPITAL_COMMUNITY): Payer: Medicare HMO

## 2017-01-23 LAB — BASIC METABOLIC PANEL
ANION GAP: 12 (ref 5–15)
BUN: 82 mg/dL — AB (ref 6–20)
CHLORIDE: 105 mmol/L (ref 101–111)
CO2: 18 mmol/L — ABNORMAL LOW (ref 22–32)
Calcium: 8.8 mg/dL — ABNORMAL LOW (ref 8.9–10.3)
Creatinine, Ser: 3.2 mg/dL — ABNORMAL HIGH (ref 0.61–1.24)
GFR calc Af Amer: 21 mL/min — ABNORMAL LOW (ref >60)
GFR calc non Af Amer: 18 mL/min — ABNORMAL LOW (ref >60)
GLUCOSE: 165 mg/dL — AB (ref 65–99)
POTASSIUM: 3.9 mmol/L (ref 3.5–5.1)
Sodium: 135 mmol/L (ref 135–145)

## 2017-01-23 LAB — GLUCOSE, CAPILLARY
GLUCOSE-CAPILLARY: 135 mg/dL — AB (ref 65–99)
GLUCOSE-CAPILLARY: 149 mg/dL — AB (ref 65–99)
GLUCOSE-CAPILLARY: 126 mg/dL — AB (ref 65–99)
Glucose-Capillary: 130 mg/dL — ABNORMAL HIGH (ref 65–99)

## 2017-01-23 LAB — HEMOGLOBIN A1C
Hgb A1c MFr Bld: 5.8 % — ABNORMAL HIGH (ref 4.8–5.6)
Mean Plasma Glucose: 120 mg/dL

## 2017-01-23 MED ORDER — IOPAMIDOL (ISOVUE-300) INJECTION 61%
INTRAVENOUS | Status: AC
  Administered 2017-01-23: 30 mL
  Filled 2017-01-23: qty 30

## 2017-01-23 NOTE — Progress Notes (Signed)
Initial Nutrition Assessment  DOCUMENTATION CODES:   Obesity unspecified  INTERVENTION:  -Ensure Enlive BID, each supplement provides 350 calories and 20 grams protein  NUTRITION DIAGNOSIS:   Increased nutrient needs related to acute illness as evidenced by estimated needs.  GOAL:   Patient will meet greater than or equal to 90% of their needs  MONITOR:   PO intake, Supplement acceptance, Weight trends, I & O's  REASON FOR ASSESSMENT:   Consult Assessment of nutrition requirement/status  ASSESSMENT:   71 y.o. Male on home O2 (2L) with PMH of COPD, Chronic respiratory failure, HTN, DM (Type 2), CKD (Stage IV) presents from a SNF with COPD exacerbation, SOB, and abdominal pain   Pt reports a slow increase in appetite stated "it's coming back to me". Pt recalls for breakfast he consumed "almost all" of his pancakes, oatmeal and sausage.   Pt states PTA he tried to consume a good breakfast and small snacks/meals throughout the day. Pt also requested Ensure during his stay, pt states he drank them regularly PTA.   Pt reports fatigue when eating from his shaking hands and jaw from chewing. Pt states nutritional supplement drinks are much easier to consume.   Pt reports no recent weight loss but a weight gain. Pt states a UBW of 230-235. Pt currently weighs 258 lbs and has had a steady increase in weight since September 2017.   Per nutrition focused physical exam pt shows no muscle depletion, no fat depletion and could not assess edema.   Labs reviewed; CBG (130-231) Medications reviewed; 0.5 mcg Calcitriol, 2 mg Decadron  Diet Order:  Diet heart healthy/carb modified Room service appropriate? Yes; Fluid consistency: Thin  Skin:  Reviewed, no issues  Last BM:  No BM recorded   Height:   Ht Readings from Last 1 Encounters:  01/22/17 6\' 1"  (1.854 m)    Weight:   Wt Readings from Last 1 Encounters:  01/22/17 258 lb (117 kg)    Ideal Body Weight:  83.6 kg  BMI:   Body mass index is 34.04 kg/m.  Estimated Nutritional Needs:   Kcal:  1600-1800  Protein:  120-130 grams  Fluid:  >/= 1.6 L / d  EDUCATION NEEDS:   Education needs addressed  Parks Ranger Dietetic Intern

## 2017-01-23 NOTE — Progress Notes (Signed)
Subjective: This is a patient of Dr. Legrand Rams and Dr. Legrand Rams is out of town. [name] known to have COPD heart failure chronic renal failure. He says he is having abdominal pain now. His abdomen appears more protuberant than I have seen in the past. He was in the hospital for about 2 weeks in [place] and we don't have those records yet. He was then discharged to a skilled care facility but was only there for about 24 hours and was sent to the hospital with shortness of breath. He says his breathing is giving him trouble mostly because it hurts  Objective: Vital signs in last 24 hours: Temp:  [98.3 F (36.8 C)-99.8 F (37.7 C)] 98.4 F (36.9 C) (02/16 0602) Pulse Rate:  [96-113] 96 (02/16 0602) Resp:  [16-28] 18 (02/16 0602) BP: (90-150)/(38-53) 123/46 (02/16 0602) SpO2:  [95 %-100 %] 97 % (02/16 0824) Weight:  [117 kg (258 lb)] 117 kg (258 lb) (02/15 1207) Weight change:     Intake/Output from previous day: 02/15 0701 - 02/16 0700 In: -  Out: 700 [Urine:700]  PHYSICAL EXAM General appearance: alert, cooperative and mild distress Resp: rhonchi bilaterally Cardio: regular rate and rhythm, S1, S2 normal, no murmur, click, rub or gallop GI: Mildly tender in the epigastric region. His abdomen is more protuberant than usual Extremities: venous stasis dermatitis noted Skin warm and dry.  Lab Results:  Results for orders placed or performed during the hospital encounter of 01/22/17 (from the past 48 hour(s))  Urinalysis, Routine w reflex microscopic     Status: Abnormal   Collection Time: 01/22/17 12:17 PM  Result Value Ref Range   Color, Urine YELLOW YELLOW   APPearance HAZY (A) CLEAR   Specific Gravity, Urine 1.014 1.005 - 1.030   pH 5.0 5.0 - 8.0   Glucose, UA NEGATIVE NEGATIVE mg/dL   Hgb urine dipstick NEGATIVE NEGATIVE   Bilirubin Urine NEGATIVE NEGATIVE   Ketones, ur NEGATIVE NEGATIVE mg/dL   Protein, ur NEGATIVE NEGATIVE mg/dL   Nitrite NEGATIVE NEGATIVE   Leukocytes,  UA NEGATIVE NEGATIVE  Basic metabolic panel     Status: Abnormal   Collection Time: 01/22/17 12:19 PM  Result Value Ref Range   Sodium 134 (L) 135 - 145 mmol/L   Potassium 3.6 3.5 - 5.1 mmol/L   Chloride 106 101 - 111 mmol/L   CO2 18 (L) 22 - 32 mmol/L   Glucose, Bld 148 (H) 65 - 99 mg/dL   BUN 75 (H) 6 - 20 mg/dL   Creatinine, Ser 3.42 (H) 0.61 - 1.24 mg/dL   Calcium 8.9 8.9 - 10.3 mg/dL   GFR calc non Af Amer 17 (L) >60 mL/min   GFR calc Af Amer 19 (L) >60 mL/min    Comment: (NOTE) The eGFR has been calculated using the CKD EPI equation. This calculation has not been validated in all clinical situations. eGFR's persistently <60 mL/min signify possible Chronic Kidney Disease.    Anion gap 10 5 - 15  Brain natriuretic peptide     Status: Abnormal   Collection Time: 01/22/17 12:19 PM  Result Value Ref Range   B Natriuretic Peptide 109.0 (H) 0.0 - 100.0 pg/mL  Troponin I     Status: Abnormal   Collection Time: 01/22/17 12:19 PM  Result Value Ref Range   Troponin I 0.04 (HH) <0.03 ng/mL    Comment: CRITICAL RESULT CALLED TO, READ BACK BY AND VERIFIED WITH: LASHLEY,S AT 1345 ON 01/22/17 BY ISLEY,B   Lactic acid,  plasma     Status: None   Collection Time: 01/22/17 12:19 PM  Result Value Ref Range   Lactic Acid, Venous 1.1 0.5 - 1.9 mmol/L  CBC with Differential     Status: Abnormal   Collection Time: 01/22/17 12:19 PM  Result Value Ref Range   WBC 8.9 4.0 - 10.5 K/uL   RBC 3.05 (L) 4.22 - 5.81 MIL/uL   Hemoglobin 9.3 (L) 13.0 - 17.0 g/dL   HCT 27.9 (L) 39.0 - 52.0 %   MCV 91.5 78.0 - 100.0 fL   MCH 30.5 26.0 - 34.0 pg   MCHC 33.3 30.0 - 36.0 g/dL   RDW 14.7 11.5 - 15.5 %   Platelets 260 150 - 400 K/uL   Neutrophils Relative % 69 %   Neutro Abs 6.2 1.7 - 7.7 K/uL   Lymphocytes Relative 14 %   Lymphs Abs 1.2 0.7 - 4.0 K/uL   Monocytes Relative 9 %   Monocytes Absolute 0.8 0.1 - 1.0 K/uL   Eosinophils Relative 8 %   Eosinophils Absolute 0.7 0.0 - 0.7 K/uL   Basophils  Relative 0 %   Basophils Absolute 0.0 0.0 - 0.1 K/uL  POC occult blood, ED     Status: None   Collection Time: 01/22/17 12:19 PM  Result Value Ref Range   Fecal Occult Bld NEGATIVE NEGATIVE  Glucose, capillary     Status: Abnormal   Collection Time: 01/22/17  5:14 PM  Result Value Ref Range   Glucose-Capillary 157 (H) 65 - 99 mg/dL   Comment 1 Notify RN   Glucose, capillary     Status: Abnormal   Collection Time: 01/22/17  9:11 PM  Result Value Ref Range   Glucose-Capillary 231 (H) 65 - 99 mg/dL   Comment 1 Notify RN    Comment 2 Document in Chart   MRSA PCR Screening     Status: None   Collection Time: 01/22/17  9:45 PM  Result Value Ref Range   MRSA by PCR NEGATIVE NEGATIVE    Comment:        The GeneXpert MRSA Assay (FDA approved for NASAL specimens only), is one component of a comprehensive MRSA colonization surveillance program. It is not intended to diagnose MRSA infection nor to guide or monitor treatment for MRSA infections.   Basic metabolic panel     Status: Abnormal   Collection Time: 01/23/17  5:20 AM  Result Value Ref Range   Sodium 135 135 - 145 mmol/L   Potassium 3.9 3.5 - 5.1 mmol/L   Chloride 105 101 - 111 mmol/L   CO2 18 (L) 22 - 32 mmol/L   Glucose, Bld 165 (H) 65 - 99 mg/dL   BUN 82 (H) 6 - 20 mg/dL   Creatinine, Ser 3.20 (H) 0.61 - 1.24 mg/dL   Calcium 8.8 (L) 8.9 - 10.3 mg/dL   GFR calc non Af Amer 18 (L) >60 mL/min   GFR calc Af Amer 21 (L) >60 mL/min    Comment: (NOTE) The eGFR has been calculated using the CKD EPI equation. This calculation has not been validated in all clinical situations. eGFR's persistently <60 mL/min signify possible Chronic Kidney Disease.    Anion gap 12 5 - 15  Glucose, capillary     Status: Abnormal   Collection Time: 01/23/17  7:41 AM  Result Value Ref Range   Glucose-Capillary 130 (H) 65 - 99 mg/dL    ABGS No results for input(s): PHART, PO2ART, TCO2, HCO3 in the last  72 hours.  Invalid input(s):  PCO2 CULTURES Recent Results (from the past 240 hour(s))  MRSA PCR Screening     Status: None   Collection Time: 01/22/17  9:45 PM  Result Value Ref Range Status   MRSA by PCR NEGATIVE NEGATIVE Final    Comment:        The GeneXpert MRSA Assay (FDA approved for NASAL specimens only), is one component of a comprehensive MRSA colonization surveillance program. It is not intended to diagnose MRSA infection nor to guide or monitor treatment for MRSA infections.    Studies/Results: Dg Chest Port 1 View  Result Date: 01/22/2017 CLINICAL DATA:  Shortness of breath, wheezing and fever. EXAM: PORTABLE CHEST 1 VIEW COMPARISON:  12/17/2016 and 12/15/2016 FINDINGS: Stable enlargement of the cardiac silhouette. Prominent right central vascular structures appear chronic. Lungs are clear without pulmonary edema or focal airspace disease. Chronic mild elevation of the right hemidiaphragm. IMPRESSION: Stable cardiomegaly. No acute chest findings. Electronically Signed   By: Markus Daft M.D.   On: 01/22/2017 12:38    Medications:  Prior to Admission:  Prescriptions Prior to Admission  Medication Sig Dispense Refill Last Dose  . acetaminophen (TYLENOL) 325 MG tablet Take 650 mg by mouth every 6 (six) hours as needed.   01/21/2017 at Unknown time  . amLODipine (NORVASC) 10 MG tablet Take 1 tablet (10 mg total) by mouth daily. 30 tablet 2 01/21/2017 at Unknown time  . calcitRIOL (ROCALTROL) 0.5 MCG capsule Take 0.5 mcg by mouth daily.   01/21/2017 at Unknown time  . desmopressin (DDAVP) 0.2 MG tablet Take 1 tablet (0.2 mg total) by mouth 2 (two) times daily. 60 tablet 12 01/21/2017 at Unknown time  . HYDROcodone-acetaminophen (NORCO/VICODIN) 5-325 MG tablet Take one tablet by mouth every 4 hours as needed for moderate pain. Max APAP 3gm/24 hours 180 tablet 0 01/21/2017 at Unknown time  . ipratropium-albuterol (DUONEB) 0.5-2.5 (3) MG/3ML SOLN Take 3 mLs by nebulization every 4 (four) hours as needed.    01/22/2017 at Unknown time  . isosorbide-hydrALAZINE (BIDIL) 20-37.5 MG tablet Take 1 tablet by mouth 3 (three) times daily. 90 tablet 3 01/21/2017 at Unknown time  . levothyroxine (SYNTHROID, LEVOTHROID) 150 MCG tablet Take 1 tablet (150 mcg total) by mouth daily before breakfast. 30 tablet 12 01/21/2017 at Unknown time  . OXYGEN 02 @@2  L pm via nasal cannula every shift   Taking  . phenytoin (DILANTIN) 100 MG ER capsule Take 100 mg by mouth 3 (three) times daily.   01/21/2017 at Unknown time  . torsemide (DEMADEX) 20 MG tablet Take 40 mg by mouth 2 (two) times daily.    01/21/2017 at Unknown time   Scheduled: . calcitRIOL  0.5 mcg Oral Daily  . desmopressin  0.2 mg Oral BID  . dexamethasone  2 mg Oral Daily  . heparin  5,000 Units Subcutaneous Q8H  . insulin aspart  0-9 Units Subcutaneous TID WC  . ipratropium-albuterol  3 mL Nebulization QID  . isosorbide-hydrALAZINE  1 tablet Oral TID  . levothyroxine  150 mcg Oral QAC breakfast  . phenytoin  100 mg Oral TID  . torsemide  40 mg Oral BID   Continuous:  OMV:[id number], HYDROcodone-acetaminophen  Assesment: [name] admitted with COPD exacerbation. 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 He has chronic kidney disease and this looks worse. He has heart failure but this does not seem to be the problem now. He has a history of cellulitis but I don't see evidence of cellulitis at  this point. He's complaining of abdominal pain. 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 Continue other treatments for now. He's not ready for discharge yet    LOS: 1 day   Aiven Kampe L 01/23/2017, 8:57 AM

## 2017-01-23 NOTE — Clinical Social Work Note (Signed)
Clinical Social Work Assessment  Patient Details  Name: [name] [name] MRN: [id number] Date of Birth: 1946/03/23  Date of referral:  01/23/17               Reason for consult:  Discharge Planning                Permission sought to share information with:    Permission granted to share information::     Name::        Agency::     Relationship::     Contact Information:     Housing/Transportation Living arrangements for the past 2 months:  Single Family Home Source of Information:  Patient, Facility Patient Interpreter Needed:  None Criminal Activity/Legal Involvement Pertinent to Current Situation/Hospitalization:  No - Comment as needed Significant Relationships:  Adult Children Lives with:  Self Do you feel safe going back to the place where you live?  Yes Need for family participation in patient care:  Yes (Comment)  Care giving concerns:  None identified.    Social Worker assessment / plan: LCSW spoke with Keri at [facility]. Patient came to [facility] on Wednesday as a result of being released from hospital in [place]. Patient has had rehab at [facility] twice in the past.  Patient will not need a new FL2 to return to the facility. Patient ambulates with a walker and requires some assistance with ADLs.  Patient has a supportive family. Patient confirmed statements. He plans on returning to [facility] for rehab purposes at discharge.    Employment status:  Retired Nurse, adult PT Recommendations:  Not assessed at this time Information / Referral to community resources:     Patient/Family's Response to care:  Patient is agreeable to return to [facility] to complete rehab at discharge.    Patient/Family's Understanding of and Emotional Response to Diagnosis, Current Treatment, and Prognosis:  Patient understands his diagnosis, treatmetn and prognosis.   Emotional Assessment Appearance:  Appears stated age Attitude/Demeanor/Rapport:   (Cooperative/pleasant) Affect (typically  observed):  Accepting Orientation:  Oriented to Self, Oriented to Place, Oriented to  Time, Oriented to Situation Alcohol / Substance use:  Not Applicable Psych involvement (Current and /or in the community):     Discharge Needs  Concerns to be addressed:  Discharge Planning Concerns Readmission within the last 30 days:  Yes Current discharge risk:  Chronically ill Barriers to Discharge:  No Barriers Identified   [name] Gully, LCSW 01/23/2017, 11:07 AM

## 2017-01-24 LAB — GLUCOSE, CAPILLARY
GLUCOSE-CAPILLARY: 93 mg/dL (ref 65–99)
GLUCOSE-CAPILLARY: 97 mg/dL (ref 65–99)
GLUCOSE-CAPILLARY: 125 mg/dL — AB (ref 65–99)
GLUCOSE-CAPILLARY: 134 mg/dL — AB (ref 65–99)

## 2017-01-24 LAB — URINE CULTURE: CULTURE: NO GROWTH

## 2017-01-24 NOTE — Progress Notes (Signed)
Subjective: He says he feels about the same. [name] having a lot of weakness in his arms. He still has some abdominal discomfort. Generally he looks better.  Objective: Vital signs in last 24 hours: Temp:  [98.3 F (36.8 C)-98.5 F (36.9 C)] 98.3 F (36.8 C) (02/17 0551) Pulse Rate:  [93-97] 93 (02/17 0551) Resp:  [18-20] 18 (02/17 0551) BP: (115-124)/(37-42) 118/37 (02/17 0551) SpO2:  [96 %-99 %] 98 % (02/17 0714) Weight change:     Intake/Output from previous day: 02/16 0701 - 02/17 0700 In: 300 [P.O.:300] Out: 1395 [Urine:1395]  PHYSICAL EXAM General appearance: alert, cooperative and no distress Resp: clear to auscultation bilaterally Cardio: regular rate and rhythm, S1, S2 normal, no murmur, click, rub or gallop GI: soft, non-tender; bowel sounds normal; no masses,  no organomegaly Extremities: venous stasis dermatitis noted Skin warm and dry. Mucous membranes are moist  Lab Results:  Results for orders placed or performed during the hospital encounter of 01/22/17 (from the past 48 hour(s))  Urinalysis, Routine w reflex microscopic     Status: Abnormal   Collection Time: 01/22/17 12:17 PM  Result Value Ref Range   Color, Urine YELLOW YELLOW   APPearance HAZY (A) CLEAR   Specific Gravity, Urine 1.014 1.005 - 1.030   pH 5.0 5.0 - 8.0   Glucose, UA NEGATIVE NEGATIVE mg/dL   Hgb urine dipstick NEGATIVE NEGATIVE   Bilirubin Urine NEGATIVE NEGATIVE   Ketones, ur NEGATIVE NEGATIVE mg/dL   Protein, ur NEGATIVE NEGATIVE mg/dL   Nitrite NEGATIVE NEGATIVE   Leukocytes, UA NEGATIVE NEGATIVE  Urine culture     Status: None   Collection Time: 01/22/17 12:17 PM  Result Value Ref Range   Specimen Description URINE, CATHETERIZED    Special Requests NONE    Culture      NO GROWTH Performed at [facility], [address] [place], [place] [address]    Report Status 01/24/2017 FINAL   Basic metabolic panel     Status: Abnormal   Collection Time: 01/22/17 12:19 PM   Result Value Ref Range   Sodium 134 (L) 135 - 145 mmol/L   Potassium 3.6 3.5 - 5.1 mmol/L   Chloride 106 101 - 111 mmol/L   CO2 18 (L) 22 - 32 mmol/L   Glucose, Bld 148 (H) 65 - 99 mg/dL   BUN 75 (H) 6 - 20 mg/dL   Creatinine, Ser 3.42 (H) 0.61 - 1.24 mg/dL   Calcium 8.9 8.9 - 10.3 mg/dL   GFR calc non Af Amer 17 (L) >60 mL/min   GFR calc Af Amer 19 (L) >60 mL/min    Comment: (NOTE) The eGFR has been calculated using the CKD EPI equation. This calculation has not been validated in all clinical situations. eGFR's persistently <60 mL/min signify possible Chronic Kidney Disease.    Anion gap 10 5 - 15  Brain natriuretic peptide     Status: Abnormal   Collection Time: 01/22/17 12:19 PM  Result Value Ref Range   B Natriuretic Peptide 109.0 (H) 0.0 - 100.0 pg/mL  Troponin I     Status: Abnormal   Collection Time: 01/22/17 12:19 PM  Result Value Ref Range   Troponin I 0.04 (HH) <0.03 ng/mL    Comment: CRITICAL RESULT CALLED TO, READ BACK BY AND VERIFIED WITH: LASHLEY,S AT 1345 ON 01/22/17 BY ISLEY,B   Lactic acid, plasma     Status: None   Collection Time: 01/22/17 12:19 PM  Result Value Ref Range  Lactic Acid, Venous 1.1 0.5 - 1.9 mmol/L  CBC with Differential     Status: Abnormal   Collection Time: 01/22/17 12:19 PM  Result Value Ref Range   WBC 8.9 4.0 - 10.5 K/uL   RBC 3.05 (L) 4.22 - 5.81 MIL/uL   Hemoglobin 9.3 (L) 13.0 - 17.0 g/dL   HCT 27.9 (L) 39.0 - 52.0 %   MCV 91.5 78.0 - 100.0 fL   MCH 30.5 26.0 - 34.0 pg   MCHC 33.3 30.0 - 36.0 g/dL   RDW 14.7 11.5 - 15.5 %   Platelets 260 150 - 400 K/uL   Neutrophils Relative % 69 %   Neutro Abs 6.2 1.7 - 7.7 K/uL   Lymphocytes Relative 14 %   Lymphs Abs 1.2 0.7 - 4.0 K/uL   Monocytes Relative 9 %   Monocytes Absolute 0.8 0.1 - 1.0 K/uL   Eosinophils Relative 8 %   Eosinophils Absolute 0.7 0.0 - 0.7 K/uL   Basophils Relative 0 %   Basophils Absolute 0.0 0.0 - 0.1 K/uL  POC occult blood, ED     Status: None    Collection Time: 01/22/17 12:19 PM  Result Value Ref Range   Fecal Occult Bld NEGATIVE NEGATIVE  Glucose, capillary     Status: Abnormal   Collection Time: 01/22/17  5:14 PM  Result Value Ref Range   Glucose-Capillary 157 (H) 65 - 99 mg/dL   Comment 1 Notify RN   Glucose, capillary     Status: Abnormal   Collection Time: 01/22/17  9:11 PM  Result Value Ref Range   Glucose-Capillary 231 (H) 65 - 99 mg/dL   Comment 1 Notify RN    Comment 2 Document in Chart   MRSA PCR Screening     Status: None   Collection Time: 01/22/17  9:45 PM  Result Value Ref Range   MRSA by PCR NEGATIVE NEGATIVE    Comment:        The GeneXpert MRSA Assay (FDA approved for NASAL specimens only), is one component of a comprehensive MRSA colonization surveillance program. It is not intended to diagnose MRSA infection nor to guide or monitor treatment for MRSA infections.   Basic metabolic panel     Status: Abnormal   Collection Time: 01/23/17  5:20 AM  Result Value Ref Range   Sodium 135 135 - 145 mmol/L   Potassium 3.9 3.5 - 5.1 mmol/L   Chloride 105 101 - 111 mmol/L   CO2 18 (L) 22 - 32 mmol/L   Glucose, Bld 165 (H) 65 - 99 mg/dL   BUN 82 (H) 6 - 20 mg/dL   Creatinine, Ser 3.20 (H) 0.61 - 1.24 mg/dL   Calcium 8.8 (L) 8.9 - 10.3 mg/dL   GFR calc non Af Amer 18 (L) >60 mL/min   GFR calc Af Amer 21 (L) >60 mL/min    Comment: (NOTE) The eGFR has been calculated using the CKD EPI equation. This calculation has not been validated in all clinical situations. eGFR's persistently <60 mL/min signify possible Chronic Kidney Disease.    Anion gap 12 5 - 15  Glucose, capillary     Status: Abnormal   Collection Time: 01/23/17  7:41 AM  Result Value Ref Range   Glucose-Capillary 130 (H) 65 - 99 mg/dL  Glucose, capillary     Status: Abnormal   Collection Time: 01/23/17 11:15 AM  Result Value Ref Range   Glucose-Capillary 135 (H) 65 - 99 mg/dL  Glucose, capillary  Status: Abnormal   Collection Time:  01/23/17  4:44 PM  Result Value Ref Range   Glucose-Capillary 149 (H) 65 - 99 mg/dL   Comment 1 Notify RN    Comment 2 Document in Chart   Glucose, capillary     Status: Abnormal   Collection Time: 01/23/17  8:55 PM  Result Value Ref Range   Glucose-Capillary 126 (H) 65 - 99 mg/dL   Comment 1 Notify RN    Comment 2 Document in Chart   Glucose, capillary     Status: None   Collection Time: 01/24/17  7:22 AM  Result Value Ref Range   Glucose-Capillary 93 65 - 99 mg/dL   Comment 1 Notify RN    Comment 2 Document in Chart     ABGS No results for input(s): PHART, PO2ART, TCO2, HCO3 in the last 72 hours.  Invalid input(s): PCO2 CULTURES Recent Results (from the past 240 hour(s))  Urine culture     Status: None   Collection Time: 01/22/17 12:17 PM  Result Value Ref Range Status   Specimen Description URINE, CATHETERIZED  Final   Special Requests NONE  Final   Culture   Final    NO GROWTH Performed at [facility], [address], [place], [place] [address]    Report Status 01/24/2017 FINAL  Final  MRSA PCR Screening     Status: None   Collection Time: 01/22/17  9:45 PM  Result Value Ref Range Status   MRSA by PCR NEGATIVE NEGATIVE Final    Comment:        The GeneXpert MRSA Assay (FDA approved for NASAL specimens only), is one component of a comprehensive MRSA colonization surveillance program. It is not intended to diagnose MRSA infection nor to guide or monitor treatment for MRSA infections.    Studies/Results: Ct Abdomen Pelvis Wo Contrast  Result Date: 01/23/2017 CLINICAL DATA:  Epigastric pain for 1 day, shortness of breath, history hypertension, type II diabetes mellitus, COPD, chronic systolic CHF, stage IV chronic kidney disease, former smoker, panhypopituitarism EXAM: CT ABDOMEN AND PELVIS WITHOUT CONTRAST TECHNIQUE: Multidetector CT imaging of the abdomen and pelvis was performed following the standard protocol without IV contrast. Sagittal and coronal  MPR images reconstructed from axial data set. COMPARISON:  07/20/2016 FINDINGS: Lower chest: Bibasilar atelectasis. RIGHT lower lobe bilobed nodule 12 x 9 mm, previously 11 x 8 mm on 07/20/2016 and 11 x 9 mm on 12/06/2014. Hepatobiliary: Gallbladder and liver normal appearance Pancreas: Normal appearance Spleen: Normal appearance Adrenals/Urinary Tract: Adrenal glands normal appearance. Post RIGHT nephrectomy. Multiple LEFT renal nodules, some of which are intermediate to high attenuation including a 2.4 x 2.0 cm anterior LEFT renal nodule image 39 previously 2.2 x 2.0 cm. Low-attenuation cyst medial LEFT kidney 2.6 x 2.2 cm image 42 previously 2.3 x 2.1 cm. No LEFT hydronephrosis or definite calculi. Remaining visualized LEFT renal nodules appear little changed in sizes. Unremarkable LEFT ureter and urinary bladder. Stomach/Bowel: Normal appendix. Fluid fills sigmoid colon and rectum. Stomach and bowel loops otherwise normal appearance. Vascular/Lymphatic: Atherosclerotic calcifications aorta, iliac arteries and coronary arteries. Curvilinear calcification within the mesenteries image 49 11 x 10 mm question calcified mesenteric artery aneurysm, less likely calcified lymph node or mass, unchanged. No adenopathy. Enlarged heart. 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 Reproductive: Unremarkable prostate gland and seminal vesicles Other: No free air or free fluid. Small LEFT inguinal and umbilical hernias containing fat. Musculoskeletal: Degenerative disc and facet disease changes  at lower lumbar spine. 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 Distended fluid-filled rectosigmoid colon question history of diarrhea. Small LEFT inguinal and umbilical hernias containing fat. Post RIGHT nephrectomy with multiple LEFT renal nodules which appear represent a  combination of cysts and potentially hemorrhagic/high attenuation cysts ; while the majority these lesions are little changed from prior exam, consider either follow-up CT to demonstrate stability or characterization by ultrasound or MR imaging to exclude solid mass. Question small calcified mesenteric artery aneurysm 11 x 10 mm diameter. Stable RIGHT lower lobe nodule. Electronically Signed   By: Lavonia Dana M.D.   On: 01/23/2017 12:56   Dg Chest Port 1 View  Result Date: 01/22/2017 CLINICAL DATA:  Shortness of breath, wheezing and fever. EXAM: PORTABLE CHEST 1 VIEW COMPARISON:  12/17/2016 and 12/15/2016 FINDINGS: Stable enlargement of the cardiac silhouette. Prominent right central vascular structures appear chronic. Lungs are clear without pulmonary edema or focal airspace disease. Chronic mild elevation of the right hemidiaphragm. IMPRESSION: Stable cardiomegaly. No acute chest findings. Electronically Signed   By: Markus Daft M.D.   On: 01/22/2017 12:38    Medications:  Prior to Admission:  Prescriptions Prior to Admission  Medication Sig Dispense Refill Last Dose  . acetaminophen (TYLENOL) 325 MG tablet Take 650 mg by mouth every 6 (six) hours as needed.   01/21/2017 at Unknown time  . amLODipine (NORVASC) 10 MG tablet Take 1 tablet (10 mg total) by mouth daily. 30 tablet 2 01/21/2017 at Unknown time  . calcitRIOL (ROCALTROL) 0.5 MCG capsule Take 0.5 mcg by mouth daily.   01/21/2017 at Unknown time  . desmopressin (DDAVP) 0.2 MG tablet Take 1 tablet (0.2 mg total) by mouth 2 (two) times daily. 60 tablet 12 01/21/2017 at Unknown time  . HYDROcodone-acetaminophen (NORCO/VICODIN) 5-325 MG tablet Take one tablet by mouth every 4 hours as needed for moderate pain. Max APAP 3gm/24 hours 180 tablet 0 01/21/2017 at Unknown time  . ipratropium-albuterol (DUONEB) 0.5-2.5 (3) MG/3ML SOLN Take 3 mLs by nebulization every 4 (four) hours as needed.   01/22/2017 at Unknown time  . isosorbide-hydrALAZINE (BIDIL)  20-37.5 MG tablet Take 1 tablet by mouth 3 (three) times daily. 90 tablet 3 01/21/2017 at Unknown time  . levothyroxine (SYNTHROID, LEVOTHROID) 150 MCG tablet Take 1 tablet (150 mcg total) by mouth daily before breakfast. 30 tablet 12 01/21/2017 at Unknown time  . OXYGEN 02 @_0  L pm via nasal cannula every shift   Taking  . phenytoin (DILANTIN) 100 MG ER capsule Take 100 mg by mouth 3 (three) times daily.   01/21/2017 at Unknown time  . torsemide (DEMADEX) 20 MG tablet Take 40 mg by mouth 2 (two) times daily.    01/21/2017 at Unknown time   Scheduled: . calcitRIOL  0.5 mcg Oral Daily  . desmopressin  0.2 mg Oral BID  . dexamethasone  2 mg Oral Daily  . heparin  5,000 Units Subcutaneous Q8H  . insulin aspart  0-9 Units Subcutaneous TID WC  . ipratropium-albuterol  3 mL Nebulization QID  . isosorbide-hydrALAZINE  1 tablet Oral TID  . levothyroxine  150 mcg Oral QAC breakfast  . phenytoin  100 mg Oral TID  . torsemide  40 mg Oral BID   Continuous:  EGB:[id number], HYDROcodone-acetaminophen  Assesment: He was admitted with COPD exacerbation. He seems to be doing a little bit better. He has acute on chronic kidney  disease and his renal function is still not back quite to baseline. 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 He has heart failure but that does not seem to be an active problem now. 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    LOS: 2 days   Janisha Bueso L 01/24/2017, 10:21 AM

## 2017-01-25 LAB — GLUCOSE, CAPILLARY
GLUCOSE-CAPILLARY: 133 mg/dL — AB (ref 65–99)
GLUCOSE-CAPILLARY: 98 mg/dL (ref 65–99)
Glucose-Capillary: 93 mg/dL (ref 65–99)
Glucose-Capillary: 92 mg/dL (ref 65–99)

## 2017-01-25 MED ORDER — DEXAMETHASONE 4 MG PO TABS
2.0000 mg | ORAL_TABLET | Freq: Three times a day (TID) | ORAL | Status: DC
  Administered 2017-01-25 – 2017-01-27 (×6): 2 mg via ORAL
  Filled 2017-01-25 (×9): qty 0.5

## 2017-01-25 NOTE — Progress Notes (Signed)
Subjective: He says he feels a little bit better. [name] still very weak. [name] now complaining of more left upper quadrant pain. [name] negative about 3.5 L since admission. [name] still very short of breath after any exertion  Objective: Vital signs in last 24 hours: Temp:  [98 F (36.7 C)-98.9 F (37.2 C)] 98.4 F (36.9 C) (02/18 0500) Pulse Rate:  [50-110] 50 (02/18 0500) Resp:  [20] 20 (02/18 0500) BP: (126-144)/(41-54) 143/54 (02/18 0500) SpO2:  [96 %-100 %] 97 % (02/18 0815) Weight change:     Intake/Output from previous day: 02/17 0701 - 02/18 0700 In: 720 [P.O.:720] Out: 2400 [Urine:2400]  PHYSICAL EXAM General appearance: alert, cooperative, mild distress and morbidly obese Resp: rhonchi bilaterally Cardio: regular rate and rhythm, S1, S2 normal, no murmur, click, rub or gallop GI: soft, non-tender; bowel sounds normal; no masses,  no organomegaly Extremities: venous stasis dermatitis noted Skin warm and dry. Mucous membranes are moist  Lab Results:  Results for orders placed or performed during the hospital encounter of 01/22/17 (from the past 48 hour(s))  Glucose, capillary     Status: Abnormal   Collection Time: 01/23/17 11:15 AM  Result Value Ref Range   Glucose-Capillary 135 (H) 65 - 99 mg/dL  Glucose, capillary     Status: Abnormal   Collection Time: 01/23/17  4:44 PM  Result Value Ref Range   Glucose-Capillary 149 (H) 65 - 99 mg/dL   Comment 1 Notify RN    Comment 2 Document in Chart   Glucose, capillary     Status: Abnormal   Collection Time: 01/23/17  8:55 PM  Result Value Ref Range   Glucose-Capillary 126 (H) 65 - 99 mg/dL   Comment 1 Notify RN    Comment 2 Document in Chart   Glucose, capillary     Status: None   Collection Time: 01/24/17  7:22 AM  Result Value Ref Range   Glucose-Capillary 93 65 - 99 mg/dL   Comment 1 Notify RN    Comment 2 Document in Chart   Glucose, capillary     Status: None   Collection Time: 01/24/17 11:24 AM  Result  Value Ref Range   Glucose-Capillary 97 65 - 99 mg/dL   Comment 1 Notify RN    Comment 2 Document in Chart   Glucose, capillary     Status: Abnormal   Collection Time: 01/24/17  4:07 PM  Result Value Ref Range   Glucose-Capillary 125 (H) 65 - 99 mg/dL   Comment 1 Notify RN    Comment 2 Document in Chart   Glucose, capillary     Status: Abnormal   Collection Time: 01/24/17  9:01 PM  Result Value Ref Range   Glucose-Capillary 134 (H) 65 - 99 mg/dL  Glucose, capillary     Status: None   Collection Time: 01/25/17  7:28 AM  Result Value Ref Range   Glucose-Capillary 93 65 - 99 mg/dL   Comment 1 Notify RN    Comment 2 Document in Chart     ABGS No results for input(s): PHART, PO2ART, TCO2, HCO3 in the last 72 hours.  Invalid input(s): PCO2 CULTURES Recent Results (from the past 240 hour(s))  Urine culture     Status: None   Collection Time: 01/22/17 12:17 PM  Result Value Ref Range Status   Specimen Description URINE, CATHETERIZED  Final   Special Requests NONE  Final   Culture   Final    NO GROWTH Performed at  [facility], [place] [address], [place], [place] [address]    Report Status 01/24/2017 FINAL  Final  MRSA PCR Screening     Status: None   Collection Time: 01/22/17  9:45 PM  Result Value Ref Range Status   MRSA by PCR NEGATIVE NEGATIVE Final    Comment:        The GeneXpert MRSA Assay (FDA approved for NASAL specimens only), is one component of a comprehensive MRSA colonization surveillance program. It is not intended to diagnose MRSA infection nor to guide or monitor treatment for MRSA infections.    Studies/Results: Ct Abdomen Pelvis Wo Contrast  Result Date: 01/23/2017 CLINICAL DATA:  Epigastric pain for 1 day, shortness of breath, history hypertension, type II diabetes mellitus, COPD, chronic systolic CHF, stage IV chronic kidney disease, former smoker, panhypopituitarism EXAM: CT ABDOMEN AND PELVIS WITHOUT CONTRAST TECHNIQUE: Multidetector CT imaging  of the abdomen and pelvis was performed following the standard protocol without IV contrast. Sagittal and coronal MPR images reconstructed from axial data set. COMPARISON:  07/20/2016 FINDINGS: Lower chest: Bibasilar atelectasis. RIGHT lower lobe bilobed nodule 12 x 9 mm, previously 11 x 8 mm on 07/20/2016 and 11 x 9 mm on 12/06/2014. Hepatobiliary: Gallbladder and liver normal appearance Pancreas: Normal appearance Spleen: Normal appearance Adrenals/Urinary Tract: Adrenal glands normal appearance. Post RIGHT nephrectomy. Multiple LEFT renal nodules, some of which are intermediate to high attenuation including a 2.4 x 2.0 cm anterior LEFT renal nodule image 39 previously 2.2 x 2.0 cm. Low-attenuation cyst medial LEFT kidney 2.6 x 2.2 cm image 42 previously 2.3 x 2.1 cm. No LEFT hydronephrosis or definite calculi. Remaining visualized LEFT renal nodules appear little changed in sizes. Unremarkable LEFT ureter and urinary bladder. Stomach/Bowel: Normal appendix. Fluid fills sigmoid colon and rectum. Stomach and bowel loops otherwise normal appearance. Vascular/Lymphatic: Atherosclerotic calcifications aorta, iliac arteries and coronary arteries. Curvilinear calcification within the mesenteries image 49 11 x 10 mm question calcified mesenteric artery aneurysm, less likely calcified lymph node or mass, unchanged. No adenopathy. Enlarged heart. 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 Reproductive: Unremarkable prostate gland and seminal vesicles Other: No free air or free fluid. Small LEFT inguinal and umbilical hernias containing fat. Musculoskeletal: Degenerative disc and facet disease changes at lower lumbar spine. 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 Distended fluid-filled rectosigmoid colon question history of diarrhea. Small LEFT inguinal and  umbilical hernias containing fat. Post RIGHT nephrectomy with multiple LEFT renal nodules which appear represent a combination of cysts and potentially hemorrhagic/high attenuation cysts ; while the majority these lesions are little changed from prior exam, consider either follow-up CT to demonstrate stability or characterization by ultrasound or MR imaging to exclude solid mass. Question small calcified mesenteric artery aneurysm 11 x 10 mm diameter. Stable RIGHT lower lobe nodule. Electronically Signed   By: Lavonia Dana M.D.   On: 01/23/2017 12:56    Medications:  Prior to Admission:  Prescriptions Prior to Admission  Medication Sig Dispense Refill Last Dose  . acetaminophen (TYLENOL) 325 MG tablet Take 650 mg by mouth every 6 (six) hours as needed.   01/21/2017 at Unknown time  . amLODipine (NORVASC) 10 MG tablet Take 1 tablet (10 mg total) by mouth daily. 30 tablet 2 01/21/2017 at Unknown time  . calcitRIOL (ROCALTROL) 0.5 MCG capsule Take 0.5 mcg by mouth daily.   01/21/2017 at Unknown  time  . desmopressin (DDAVP) 0.2 MG tablet Take 1 tablet (0.2 mg total) by mouth 2 (two) times daily. 60 tablet 12 01/21/2017 at Unknown time  . HYDROcodone-acetaminophen (NORCO/VICODIN) 5-325 MG tablet Take one tablet by mouth every 4 hours as needed for moderate pain. Max APAP 3gm/24 hours 180 tablet 0 01/21/2017 at Unknown time  . ipratropium-albuterol (DUONEB) 0.5-2.5 (3) MG/3ML SOLN Take 3 mLs by nebulization every 4 (four) hours as needed.   01/22/2017 at Unknown time  . isosorbide-hydrALAZINE (BIDIL) 20-37.5 MG tablet Take 1 tablet by mouth 3 (three) times daily. 90 tablet 3 01/21/2017 at Unknown time  . levothyroxine (SYNTHROID, LEVOTHROID) 150 MCG tablet Take 1 tablet (150 mcg total) by mouth daily before breakfast. 30 tablet 12 01/21/2017 at Unknown time  . OXYGEN 02 @@2  L pm via nasal cannula every shift   Taking  . phenytoin (DILANTIN) 100 MG ER capsule Take 100 mg by mouth 3 (three) times daily.   01/21/2017  at Unknown time  . torsemide (DEMADEX) 20 MG tablet Take 40 mg by mouth 2 (two) times daily.    01/21/2017 at Unknown time   Scheduled: . calcitRIOL  0.5 mcg Oral Daily  . desmopressin  0.2 mg Oral BID  . dexamethasone  2 mg Oral Daily  . heparin  5,000 Units Subcutaneous Q8H  . insulin aspart  0-9 Units Subcutaneous TID WC  . ipratropium-albuterol  3 mL Nebulization QID  . isosorbide-hydrALAZINE  1 tablet Oral TID  . levothyroxine  150 mcg Oral QAC breakfast  . phenytoin  100 mg Oral TID  . torsemide  40 mg Oral BID   Continuous:  [id number], HYDROcodone-acetaminophen  Assesment: He was admitted with COPD exacerbation. He has multiple other medical problems including chronic diastolic heart failure, chronic kidney disease which appears to be getting worse diabetes panhypopituitarism. He has been complaining of left upper quadrant abdominal pain but his CT was okay and his exam is pretty benign. He tells me he thinks he has too much fluid but he doesn't have any fluid in his legs and is -3.5 L since admission. [name] on his usual dose of steroids so I will increase that some. [name] on torsemide twice a day and receiving nebulizer treatments. 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 Check renal function. Increase steroids    LOS: 3 days   Juliane Guest L 01/25/2017, 10:39 AM

## 2017-01-26 ENCOUNTER — Inpatient Hospital Stay (HOSPITAL_COMMUNITY): Payer: Medicare HMO

## 2017-01-26 LAB — GLUCOSE, CAPILLARY
Glucose-Capillary: 86 mg/dL (ref 65–99)
Glucose-Capillary: 111 mg/dL — ABNORMAL HIGH (ref 65–99)
Glucose-Capillary: 124 mg/dL — ABNORMAL HIGH (ref 65–99)
Glucose-Capillary: 111 mg/dL — ABNORMAL HIGH (ref 65–99)

## 2017-01-26 LAB — BASIC METABOLIC PANEL
ANION GAP: 13 (ref 5–15)
BUN: 94 mg/dL — ABNORMAL HIGH (ref 6–20)
CHLORIDE: 101 mmol/L (ref 101–111)
CO2: 21 mmol/L — ABNORMAL LOW (ref 22–32)
Calcium: 8.8 mg/dL — ABNORMAL LOW (ref 8.9–10.3)
Creatinine, Ser: 3.09 mg/dL — ABNORMAL HIGH (ref 0.61–1.24)
GFR, EST AFRICAN AMERICAN: 22 mL/min — AB (ref >60)
GFR, EST NON AFRICAN AMERICAN: 19 mL/min — AB (ref >60)
Glucose, Bld: 93 mg/dL (ref 65–99)
POTASSIUM: 3.7 mmol/L (ref 3.5–5.1)
SODIUM: 135 mmol/L (ref 135–145)

## 2017-01-26 NOTE — Progress Notes (Signed)
Subjective: He seems about the same. No new complaints. [name] still short of breath. He was clearly having episodes of apnea when I went in this morning and he has sleep apnea at baseline but is noncompliant with CPAP. He feels like he has to much fluid. [name] down 3-1/2 L since admission and his renal function has improved somewhat. [name] thought to have COPD exacerbation.  Objective: Vital signs in last 24 hours: Temp:  [98.4 F (36.9 C)-98.7 F (37.1 C)] 98.4 F (36.9 C) (02/19 0440) Pulse Rate:  [53-95] 92 (02/19 0440) Resp:  [20] 20 (02/19 0440) BP: (126-134)/(46-54) 130/46 (02/19 0440) SpO2:  [95 %-100 %] 99 % (02/19 0440) Weight:  [115.1 kg (253 lb 12 oz)] 115.1 kg (253 lb 12 oz) (02/19 0440) Weight change:     Intake/Output from previous day: 02/18 0701 - 02/19 0700 In: 480 [P.O.:480] Out: 300 [Urine:300]  PHYSICAL EXAM General appearance: alert, cooperative, mild distress and morbidly obese Resp: He has minimal end expiratory wheezing bilaterally Cardio: regular rate and rhythm, S1, S2 normal, no murmur, click, rub or gallop GI: soft, non-tender; bowel sounds normal; no masses,  no organomegaly Extremities: venous stasis dermatitis noted Skin warm and dry pupils react  Lab Results:  Results for orders placed or performed during the hospital encounter of 01/22/17 (from the past 48 hour(s))  Glucose, capillary     Status: None   Collection Time: 01/24/17 11:24 AM  Result Value Ref Range   Glucose-Capillary 97 65 - 99 mg/dL   Comment 1 Notify RN    Comment 2 Document in Chart   Glucose, capillary     Status: Abnormal   Collection Time: 01/24/17  4:07 PM  Result Value Ref Range   Glucose-Capillary 125 (H) 65 - 99 mg/dL   Comment 1 Notify RN    Comment 2 Document in Chart   Glucose, capillary     Status: Abnormal   Collection Time: 01/24/17  9:01 PM  Result Value Ref Range   Glucose-Capillary 134 (H) 65 - 99 mg/dL  Glucose, capillary     Status: None   Collection  Time: 01/25/17  7:28 AM  Result Value Ref Range   Glucose-Capillary 93 65 - 99 mg/dL   Comment 1 Notify RN    Comment 2 Document in Chart   Glucose, capillary     Status: None   Collection Time: 01/25/17 11:23 AM  Result Value Ref Range   Glucose-Capillary 92 65 - 99 mg/dL   Comment 1 Notify RN    Comment 2 Document in Chart   Glucose, capillary     Status: Abnormal   Collection Time: 01/25/17  4:44 PM  Result Value Ref Range   Glucose-Capillary 133 (H) 65 - 99 mg/dL   Comment 1 Notify RN    Comment 2 Document in Chart   Glucose, capillary     Status: None   Collection Time: 01/25/17  9:43 PM  Result Value Ref Range   Glucose-Capillary 98 65 - 99 mg/dL   Comment 1 Notify RN    Comment 2 Document in Chart   Basic metabolic panel     Status: Abnormal   Collection Time: 01/26/17  5:48 AM  Result Value Ref Range   Sodium 135 135 - 145 mmol/L   Potassium 3.7 3.5 - 5.1 mmol/L   Chloride 101 101 - 111 mmol/L   CO2 21 (L) 22 - 32 mmol/L   Glucose, Bld 93 65 - 99 mg/dL  BUN 94 (H) 6 - 20 mg/dL   Creatinine, Ser 3.09 (H) 0.61 - 1.24 mg/dL   Calcium 8.8 (L) 8.9 - 10.3 mg/dL   GFR calc non Af Amer 19 (L) >60 mL/min   GFR calc Af Amer 22 (L) >60 mL/min    Comment: (NOTE) The eGFR has been calculated using the CKD EPI equation. This calculation has not been validated in all clinical situations. eGFR's persistently <60 mL/min signify possible Chronic Kidney Disease.    Anion gap 13 5 - 15  Glucose, capillary     Status: None   Collection Time: 01/26/17  8:32 AM  Result Value Ref Range   Glucose-Capillary 86 65 - 99 mg/dL   Comment 1 Notify RN    Comment 2 Document in Chart     ABGS No results for input(s): PHART, PO2ART, TCO2, HCO3 in the last 72 hours.  Invalid input(s): PCO2 CULTURES Recent Results (from the past 240 hour(s))  Urine culture     Status: None   Collection Time: 01/22/17 12:17 PM  Result Value Ref Range Status   Specimen Description URINE, CATHETERIZED   Final   Special Requests NONE  Final   Culture   Final    NO GROWTH Performed at [facility], [address], [place], [place] [address]    Report Status 01/24/2017 FINAL  Final  MRSA PCR Screening     Status: None   Collection Time: 01/22/17  9:45 PM  Result Value Ref Range Status   MRSA by PCR NEGATIVE NEGATIVE Final    Comment:        The GeneXpert MRSA Assay (FDA approved for NASAL specimens only), is one component of a comprehensive MRSA colonization surveillance program. It is not intended to diagnose MRSA infection nor to guide or monitor treatment for MRSA infections.    Studies/Results: No results found.  Medications:  Prior to Admission:  Prescriptions Prior to Admission  Medication Sig Dispense Refill Last Dose  . acetaminophen (TYLENOL) 325 MG tablet Take 650 mg by mouth every 6 (six) hours as needed.   01/21/2017 at Unknown time  . amLODipine (NORVASC) 10 MG tablet Take 1 tablet (10 mg total) by mouth daily. 30 tablet 2 01/21/2017 at Unknown time  . calcitRIOL (ROCALTROL) 0.5 MCG capsule Take 0.5 mcg by mouth daily.   01/21/2017 at Unknown time  . desmopressin (DDAVP) 0.2 MG tablet Take 1 tablet (0.2 mg total) by mouth 2 (two) times daily. 60 tablet 12 01/21/2017 at Unknown time  . HYDROcodone-acetaminophen (NORCO/VICODIN) 5-325 MG tablet Take one tablet by mouth every 4 hours as needed for moderate pain. Max APAP 3gm/24 hours 180 tablet 0 01/21/2017 at Unknown time  . ipratropium-albuterol (DUONEB) 0.5-2.5 (3) MG/3ML SOLN Take 3 mLs by nebulization every 4 (four) hours as needed.   01/22/2017 at Unknown time  . isosorbide-hydrALAZINE (BIDIL) 20-37.5 MG tablet Take 1 tablet by mouth 3 (three) times daily. 90 tablet 3 01/21/2017 at Unknown time  . levothyroxine (SYNTHROID, LEVOTHROID) 150 MCG tablet Take 1 tablet (150 mcg total) by mouth daily before breakfast. 30 tablet 12 01/21/2017 at Unknown time  . OXYGEN 02 @@2  L pm via nasal cannula every shift   Taking  .  phenytoin (DILANTIN) 100 MG ER capsule Take 100 mg by mouth 3 (three) times daily.   01/21/2017 at Unknown time  . torsemide (DEMADEX) 20 MG tablet Take 40 mg by mouth 2 (two) times daily.    01/21/2017 at Unknown time  Scheduled: . calcitRIOL  0.5 mcg Oral Daily  . desmopressin  0.2 mg Oral BID  . dexamethasone  2 mg Oral Q8H  . heparin  5,000 Units Subcutaneous Q8H  . insulin aspart  0-9 Units Subcutaneous TID WC  . ipratropium-albuterol  3 mL Nebulization QID  . isosorbide-hydrALAZINE  1 tablet Oral TID  . levothyroxine  150 mcg Oral QAC breakfast  . phenytoin  100 mg Oral TID  . torsemide  40 mg Oral BID   Continuous:  [id number], HYDROcodone-acetaminophen  Assesment: [name] admitted with COPD exacerbation. He has multiple other medical problems. He has had problems with heart failure but he doesn't have any swelling in his INR was down 3.5 L. Because of concerns I'm going to have him get a chest x-ray to see if it shows any fluid in his lungs. 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 He has sleep apnea but is noncompliant with CPAP. [name] very weak. 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 Continue treatments otherwise. Potential return to skilled care facility tomorrow    LOS: 4 days   Gerell Fortson L 01/26/2017, 8:41 AM

## 2017-01-26 NOTE — Evaluation (Signed)
Physical Therapy Evaluation Patient Details Name: [name] [name] MRN: [id number] DOB: 1946/07/06 Today's Date: 01/26/2017   History of Present Illness  71 year old man sent from skilled nursing facility for concern for COPD exacerbation, reported, abdominal pain. Evaluation in the emergency department suggested COPD exacerbation, elevated creatinine above baseline.  Clinical Impression  Pt received in bed, and was agreeable to PT evaluation.  Pt expressed that he was independent with ambulation at home, and with dressing/bathing prior to admission to [facility].  Pt reports that since then, he has only been able to get up to the chair 2x's in the past 3 weeks.  During PT evaluation today, he required Min A for supine<>sit with HOB raised, and sit<>stand with Min/Mod A from elevated surface.  He was able to ambulate a few feet from the bed to the [facility], and then from the B[facility],<>chair.  Continue to recommend for SNF.      Follow Up Recommendations SNF    Equipment Recommendations  None recommended by PT    Recommendations for Other Services       Precautions / Restrictions Precautions Precautions: Fall Precaution Comments: 1-2 falls in the past 6 months. The last fall happened after he stood up.  Restrictions Weight Bearing Restrictions: No      Mobility  Bed Mobility Overal bed mobility: Needs Assistance Bed Mobility: Supine to Sit     Supine to sit: Min assist;HOB elevated     General bed mobility comments: increased time - however, pt normally sleeps in a recliner at home.   Transfers Overall transfer level: Needs assistance Equipment used: Rolling walker (2 wheeled) Transfers: Sit to/from Stand Sit to Stand: From elevated surface            Ambulation/Gait Ambulation/Gait assistance: Min assist Ambulation Distance (Feet): 5 Feet Assistive device: Rolling walker (2 wheeled) Gait Pattern/deviations: Step-to pattern;Decreased step length - left;Decreased stance  time - right;Wide base of support     General Gait Details: Pt was able to take a few steps to the [facility] and then a few steps back to the chair.  Further mobility limited due to increased pain.   Stairs            Wheelchair Mobility    Modified Rankin (Stroke Patients Only)       Balance Overall balance assessment: History of Falls;Needs assistance Sitting-balance support: Bilateral upper extremity supported;Feet supported Sitting balance-Leahy Scale: Good     Standing balance support: Bilateral upper extremity supported Standing balance-Leahy Scale: Fair                               Pertinent Vitals/Pain Pain Assessment: 0-10 Pain Score: 1  Pain Location: R UE and in back.  Pain Descriptors / Indicators: Aching;Burning;Sharp Pain Intervention(s): Limited activity within patient's tolerance;Monitored during session;Repositioned    Home Living   Living Arrangements: Alone Available Help at Discharge: Family (dtr comes by , and sister comes by - when needed) Type of Home: House Home Access: Stairs to enter   Entrance Stairs-Number of Steps: 2 in the front and 3-4 in the back.  Home Layout: One level Home Equipment: Walker - 2 wheels;Cane - single point (O2 - some of the time 2L)      Prior Function     Gait / Transfers Assistance Needed: Pt states he has only been out of bed to sit up 2x's in the past 3 weeks.  Prior to  admission to [facility] he was not ambulating with any DME - occasionally he would need the cane.    ADL's / Homemaking Assistance Needed: Pt states he was independent for dressing and bathing.  Dtr, & neighbor assist with housework and groceries.   Comments: Pt states that he sleeps in a recliner at home.     Hand Dominance   Dominant Hand: Right    Extremity/Trunk Assessment   Upper Extremity Assessment Upper Extremity Assessment: Generalized weakness    Lower Extremity Assessment Lower Extremity Assessment:  Generalized weakness    Cervical / Trunk Assessment Cervical / Trunk Assessment: Kyphotic  Communication      Cognition Arousal/Alertness: Lethargic Behavior During Therapy: WFL for tasks assessed/performed Overall Cognitive Status: Within Functional Limits for tasks assessed                      General Comments      Exercises     Assessment/Plan    PT Assessment Patient needs continued PT services  PT Problem List Decreased strength;Decreased activity tolerance;Decreased balance;Decreased mobility;Decreased range of motion;Obesity;Pain       PT Treatment Interventions DME instruction;Gait training;Functional mobility training;Therapeutic activities;Therapeutic exercise;Patient/family education    PT Goals (Current goals can be found in the Care Plan section)  Acute Rehab PT Goals Patient Stated Goal: To get stronger and have less pain PT Goal Formulation: With patient Time For Goal Achievement: 02/02/17 Potential to Achieve Goals: Fair    Frequency Min 3X/week   Barriers to discharge Decreased caregiver support      Co-evaluation               End of Session Equipment Utilized During Treatment: Gait belt;Oxygen Activity Tolerance: Patient limited by pain;Patient limited by fatigue Patient left: in chair;with call bell/phone within reach Nurse Communication: Mobility status (mobility sheet left in room) PT Visit Diagnosis: Muscle weakness (generalized) (M62.81);History of falling (Z91.81);Difficulty in walking, not elsewhere classified (R26.2)    Functional Assessment Tool Used: AM-PAC 6 Clicks Basic Mobility Functional Limitation: Mobility: Walking and moving around Mobility: Walking and Moving Around Current Status [id number]): At least 40 percent but less than 60 percent impaired, limited or restricted Mobility: Walking and Moving Around Goal Status [phone]): At least 20 percent but less than 40 percent impaired, limited or restricted    Time:  1015-1048 PT Time Calculation (min) (ACUTE ONLY): 33 min   Charges:   PT Evaluation $PT Eval Low Complexity: 1 Procedure PT Treatments $Therapeutic Activity: 8-22 mins   PT G Codes:   PT G-Codes **NOT FOR INPATIENT CLASS** Functional Assessment Tool Used: AM-PAC 6 Clicks Basic Mobility Functional Limitation: Mobility: Walking and moving around Mobility: Walking and Moving Around Current Status [id number]): At least 40 percent but less than 60 percent impaired, limited or restricted Mobility: Walking and Moving Around Goal Status [phone]): At least 20 percent but less than 40 percent impaired, limited or restricted     Beth Lavaun [place], PT, DPT X: [phone]

## 2017-01-27 ENCOUNTER — Encounter: Payer: Self-pay | Admitting: Internal Medicine

## 2017-01-27 ENCOUNTER — Non-Acute Institutional Stay (SKILLED_NURSING_FACILITY): Payer: Medicare HMO | Admitting: Internal Medicine

## 2017-01-27 ENCOUNTER — Inpatient Hospital Stay
Admission: RE | Admit: 2017-01-27 | Discharge: 2017-02-10 | Payer: Medicare HMO | Source: Ambulatory Visit | Attending: Internal Medicine | Admitting: Internal Medicine

## 2017-01-27 DIAGNOSIS — E1129 Type 2 diabetes mellitus with other diabetic kidney complication: Secondary | ICD-10-CM | POA: Diagnosis not present

## 2017-01-27 DIAGNOSIS — J441 Chronic obstructive pulmonary disease with (acute) exacerbation: Secondary | ICD-10-CM | POA: Diagnosis not present

## 2017-01-27 DIAGNOSIS — E230 Hypopituitarism: Secondary | ICD-10-CM

## 2017-01-27 DIAGNOSIS — Z905 Acquired absence of kidney: Secondary | ICD-10-CM | POA: Diagnosis not present

## 2017-01-27 DIAGNOSIS — R900 Intracranial space-occupying lesion found on diagnostic imaging of central nervous system: Secondary | ICD-10-CM | POA: Diagnosis not present

## 2017-01-27 DIAGNOSIS — L03116 Cellulitis of left lower limb: Secondary | ICD-10-CM | POA: Diagnosis not present

## 2017-01-27 DIAGNOSIS — I351 Nonrheumatic aortic (valve) insufficiency: Secondary | ICD-10-CM | POA: Diagnosis not present

## 2017-01-27 DIAGNOSIS — M25561 Pain in right knee: Secondary | ICD-10-CM | POA: Diagnosis not present

## 2017-01-27 DIAGNOSIS — R51 Headache: Secondary | ICD-10-CM | POA: Diagnosis not present

## 2017-01-27 DIAGNOSIS — G939 Disorder of brain, unspecified: Secondary | ICD-10-CM | POA: Diagnosis not present

## 2017-01-27 DIAGNOSIS — J9610 Chronic respiratory failure, unspecified whether with hypoxia or hypercapnia: Secondary | ICD-10-CM | POA: Diagnosis not present

## 2017-01-27 DIAGNOSIS — I1310 Hypertensive heart and chronic kidney disease without heart failure, with stage 1 through stage 4 chronic kidney disease, or unspecified chronic kidney disease: Secondary | ICD-10-CM | POA: Diagnosis not present

## 2017-01-27 DIAGNOSIS — I10 Essential (primary) hypertension: Secondary | ICD-10-CM | POA: Diagnosis not present

## 2017-01-27 DIAGNOSIS — E039 Hypothyroidism, unspecified: Secondary | ICD-10-CM | POA: Diagnosis not present

## 2017-01-27 DIAGNOSIS — J9611 Chronic respiratory failure with hypoxia: Secondary | ICD-10-CM | POA: Diagnosis not present

## 2017-01-27 DIAGNOSIS — I5042 Chronic combined systolic (congestive) and diastolic (congestive) heart failure: Secondary | ICD-10-CM

## 2017-01-27 DIAGNOSIS — R569 Unspecified convulsions: Secondary | ICD-10-CM | POA: Diagnosis not present

## 2017-01-27 DIAGNOSIS — Z7984 Long term (current) use of oral hypoglycemic drugs: Secondary | ICD-10-CM | POA: Diagnosis not present

## 2017-01-27 DIAGNOSIS — J309 Allergic rhinitis, unspecified: Secondary | ICD-10-CM | POA: Diagnosis not present

## 2017-01-27 DIAGNOSIS — N184 Chronic kidney disease, stage 4 (severe): Secondary | ICD-10-CM

## 2017-01-27 DIAGNOSIS — R200 Anesthesia of skin: Secondary | ICD-10-CM | POA: Diagnosis present

## 2017-01-27 DIAGNOSIS — M79602 Pain in left arm: Secondary | ICD-10-CM | POA: Diagnosis not present

## 2017-01-27 DIAGNOSIS — E033 Postinfectious hypothyroidism: Secondary | ICD-10-CM | POA: Diagnosis not present

## 2017-01-27 DIAGNOSIS — R0602 Shortness of breath: Secondary | ICD-10-CM | POA: Diagnosis not present

## 2017-01-27 DIAGNOSIS — E1122 Type 2 diabetes mellitus with diabetic chronic kidney disease: Secondary | ICD-10-CM | POA: Diagnosis not present

## 2017-01-27 DIAGNOSIS — E119 Type 2 diabetes mellitus without complications: Secondary | ICD-10-CM | POA: Diagnosis not present

## 2017-01-27 DIAGNOSIS — R05 Cough: Secondary | ICD-10-CM | POA: Diagnosis not present

## 2017-01-27 DIAGNOSIS — D509 Iron deficiency anemia, unspecified: Secondary | ICD-10-CM | POA: Diagnosis not present

## 2017-01-27 DIAGNOSIS — Z79899 Other long term (current) drug therapy: Secondary | ICD-10-CM | POA: Diagnosis not present

## 2017-01-27 DIAGNOSIS — R079 Chest pain, unspecified: Secondary | ICD-10-CM | POA: Diagnosis not present

## 2017-01-27 DIAGNOSIS — I5033 Acute on chronic diastolic (congestive) heart failure: Secondary | ICD-10-CM | POA: Diagnosis not present

## 2017-01-27 DIAGNOSIS — G4730 Sleep apnea, unspecified: Secondary | ICD-10-CM | POA: Diagnosis not present

## 2017-01-27 DIAGNOSIS — R42 Dizziness and giddiness: Secondary | ICD-10-CM | POA: Diagnosis not present

## 2017-01-27 DIAGNOSIS — I130 Hypertensive heart and chronic kidney disease with heart failure and stage 1 through stage 4 chronic kidney disease, or unspecified chronic kidney disease: Secondary | ICD-10-CM | POA: Diagnosis not present

## 2017-01-27 DIAGNOSIS — J449 Chronic obstructive pulmonary disease, unspecified: Secondary | ICD-10-CM | POA: Diagnosis not present

## 2017-01-27 DIAGNOSIS — I5021 Acute systolic (congestive) heart failure: Secondary | ICD-10-CM | POA: Diagnosis not present

## 2017-01-27 DIAGNOSIS — Z87891 Personal history of nicotine dependence: Secondary | ICD-10-CM | POA: Diagnosis not present

## 2017-01-27 DIAGNOSIS — I509 Heart failure, unspecified: Secondary | ICD-10-CM | POA: Diagnosis not present

## 2017-01-27 DIAGNOSIS — J9621 Acute and chronic respiratory failure with hypoxia: Secondary | ICD-10-CM | POA: Diagnosis not present

## 2017-01-27 DIAGNOSIS — J42 Unspecified chronic bronchitis: Secondary | ICD-10-CM | POA: Diagnosis not present

## 2017-01-27 LAB — GLUCOSE, CAPILLARY: GLUCOSE-CAPILLARY: 94 mg/dL (ref 65–99)

## 2017-01-27 MED ORDER — PREDNISONE 10 MG (21) PO TBPK: ORAL_TABLET | ORAL | Status: DC

## 2017-01-27 NOTE — Clinical Social Work Note (Signed)
LCSW left a message for patient's daughter, [name], advising that patient was discharging and would be transported back to [facility] by [facility] staff.  LCSW left message for Kristin Bruins and Tami at [facility] advising that patient was discharging.  LCSW sent discharge paperwork to facility.    LCSW signing off.    Aser Nylund, Clydene Pugh, LCSW

## 2017-01-27 NOTE — Discharge Summary (Signed)
Physician Discharge Summary  Patient ID: [name] [name] MRN: [id number] DOB/AGE: 73-Sep-1947 73 y.o. 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   amLODipine 10 MG tablet Commonly known as:  NORVASC Take 1 tablet (10 mg total) by mouth daily.   calcitRIOL 0.5 MCG capsule Commonly known as:  ROCALTROL Take 0.5 mcg by mouth daily.   desmopressin 0.2 MG tablet Commonly known as:  DDAVP Take 1 tablet (0.2 mg total) by mouth 2 (two) times daily.   HYDROcodone-acetaminophen 5-325 MG tablet Commonly known as:  NORCO/VICODIN Take one tablet by mouth every 4 hours as needed for moderate pain. Max APAP 3gm/24 hours   ipratropium-albuterol 0.5-2.5 (3) MG/3ML Soln Commonly known as:  DUONEB Take 3 mLs by nebulization every 4 (four) hours as needed.   isosorbide-hydrALAZINE 20-37.5 MG tablet Commonly known as:  BIDIL Take 1 tablet by mouth 3 (three) times daily.   levothyroxine 150 MCG tablet Commonly known as:  SYNTHROID, LEVOTHROID Take 1 tablet (150 mcg total) by mouth daily before breakfast.   OXYGEN 02 @@2  L pm via nasal cannula every shift   phenytoin 100 MG ER capsule Commonly known as:  DILANTIN Take 100 mg by mouth 3 (three)  times daily.   predniSONE 10 MG (21) Tbpk tablet Commonly known as:  STERAPRED UNI-PAK 21 TAB Take by package instructions   torsemide 20 MG tablet Commonly known as:  DEMADEX Take 40 mg by mouth 2 (two) times daily.       Discharged Condition:Improved    Consults: None  Significant Diagnostic Studies: Ct Abdomen Pelvis Wo Contrast  Result Date: 01/23/2017 CLINICAL DATA:  Epigastric pain for 1 day, shortness of breath, history hypertension, type II diabetes mellitus, COPD, chronic systolic CHF, stage IV chronic kidney disease, former smoker, panhypopituitarism EXAM: CT ABDOMEN AND PELVIS WITHOUT CONTRAST TECHNIQUE: Multidetector CT imaging of the abdomen and pelvis was performed following the standard protocol without IV contrast. Sagittal and coronal MPR images reconstructed from axial data set. COMPARISON:  07/20/2016 FINDINGS: Lower chest: Bibasilar atelectasis. RIGHT lower lobe bilobed nodule 12 x 9 mm, previously 11 x 8 mm on 07/20/2016 and 11 x 9 mm on 12/06/2014. Hepatobiliary: Gallbladder and liver normal appearance Pancreas: Normal appearance Spleen: Normal appearance Adrenals/Urinary Tract: Adrenal glands normal appearance. Post RIGHT nephrectomy. Multiple LEFT renal nodules, some of which are intermediate to high attenuation including a 2.4 x 2.0 cm anterior LEFT renal nodule image 39 previously 2.2 x 2.0 cm. Low-attenuation cyst medial LEFT kidney 2.6 x 2.2 cm image 42 previously 2.3 x 2.1 cm. No LEFT hydronephrosis or definite calculi. Remaining visualized LEFT renal nodules appear little changed in sizes. Unremarkable LEFT ureter and urinary bladder. Stomach/Bowel: Normal appendix. Fluid fills sigmoid  colon and rectum. Stomach and bowel loops otherwise normal appearance. Vascular/Lymphatic: Atherosclerotic calcifications aorta, iliac arteries and coronary arteries. Curvilinear calcification within the mesenteries image 49 11 x 10 mm question calcified mesenteric artery aneurysm,  less likely calcified lymph node or mass, unchanged. No adenopathy. Enlarged heart. 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 Reproductive: Unremarkable prostate gland and seminal vesicles Other: No free air or free fluid. Small LEFT inguinal and umbilical hernias containing fat. Musculoskeletal: Degenerative disc and facet disease changes at lower lumbar spine. 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 Distended fluid-filled rectosigmoid colon question history of diarrhea. Small LEFT inguinal and umbilical hernias containing fat. Post RIGHT nephrectomy with multiple LEFT renal nodules which appear represent a combination of cysts and potentially hemorrhagic/high attenuation cysts ; while the majority these lesions are little changed from prior exam, consider either follow-up CT to demonstrate stability or characterization by ultrasound or MR imaging to exclude solid mass. Question small calcified mesenteric artery aneurysm 11 x 10 mm diameter. Stable RIGHT lower lobe nodule. Electronically Signed   By: Lavonia Dana M.D.   On: 01/23/2017 12:56   Dg Chest Port 1 View  Result Date: 01/26/2017 CLINICAL DATA:  Shortness of Breath.  Hypertension. EXAM: PORTABLE CHEST 1 VIEW COMPARISON:  January 22, 2017 FINDINGS: No edema or consolidation. Stable cardiomegaly. Pulmonary vascularity is normal. Aorta is mildly prominent but stable. No adenopathy. No bone lesions. IMPRESSION: Cardiomegaly. Prominent aorta, likely due to chronic hypertension. No edema or consolidation. Electronically Signed   By: Lowella Grip III M.D.   On: 01/26/2017 09:24   Dg Chest Port 1 View  Result Date: 01/22/2017 CLINICAL DATA:  Shortness of breath, wheezing and fever. EXAM: PORTABLE CHEST 1 VIEW COMPARISON:  12/17/2016 and 12/15/2016 FINDINGS: Stable enlargement of the  cardiac silhouette. Prominent right central vascular structures appear chronic. Lungs are clear without pulmonary edema or focal airspace disease. Chronic mild elevation of the right hemidiaphragm. IMPRESSION: Stable cardiomegaly. No acute chest findings. Electronically Signed   By: Markus Daft M.D.   On: 01/22/2017 12:38    Lab Results: Basic Metabolic Panel:  Recent Labs  01/26/17 0548  NA 135  K 3.7  CL 101  CO2 21*  GLUCOSE 93  BUN 94*  CREATININE 3.09*  CALCIUM 8.8*   Liver Function Tests: No results for input(s): AST, ALT, ALKPHOS, BILITOT, PROT, ALBUMIN in the last 72 hours.   CBC: No results for input(s): WBC, NEUTROABS, HGB, HCT, MCV, PLT in the last 72 hours.  Recent Results (from the past 240 hour(s))  Urine culture     Status: None   Collection Time: 01/22/17 12:17 PM  Result Value Ref Range Status   Specimen Description URINE, CATHETERIZED  Final   Special Requests NONE  Final   Culture   Final    NO GROWTH Performed at [facility], [address], [place], [place] [address]    Report Status 01/24/2017 FINAL  Final  MRSA PCR Screening     Status: None   Collection Time: 01/22/17  9:45 PM  Result Value Ref Range Status   MRSA by PCR NEGATIVE NEGATIVE Final    Comment:        The GeneXpert MRSA Assay (FDA approved for NASAL specimens only), is one component of a comprehensive MRSA colonization surveillance program. It is not intended to diagnose MRSA infection nor  to guide or monitor treatment for MRSA infections.      Hospital Course: This is a 71 year old who had been in the hospital in [place] for about 2 weeks and was transferred to skilled care facility here. He had been there for about 24 hours and developed increasing problems with shortness of breath and complaining of left upper quadrant pain. He was referred to the emergency department and was found to have COPD exacerbation. Evaluation for his left upper quadrant pain did not  show any definitive cause. He does need some follow-up from his CT which can be accomplished once he is out of the skilled care facility. He was treated with steroids inhaled bronchodilators and improved. By the time of discharge he was back at baseline. His renal function is a little worse than his baseline and that will need to be followed. He has sleep apnea has been noncompliant with CPAP but says he will consider wearing CPAP at the skilled care facility so that will need to be investigated.  Discharge Exam: Blood pressure 122/60, pulse 78, temperature 98.2 F (36.8 C), temperature source Oral, resp. rate 20, height 6\' 1"  (1.854 m), weight 115.1 kg (253 lb 12 oz), SpO2 95 %. [name] awake and alert. [name] morbidly obese. He has a systolic heart murmur. His lungs are clear. His abdomen is soft. He has chronic venous stasis changes of his legs  Disposition: To skilled care facility. He will be on a heart healthy low-sodium diet. He will need PT OT and speech. Respiratory therapy to evaluate if he will take CPAP and to provide CPAP if needed. He will need basic metabolic profile in one week.  Discharge Instructions    Diet - low sodium heart healthy    Complete by:  As directed    Increase activity slowly    Complete by:  As directed         Signed: Graylon Amory L   01/27/2017, 8:34 AM

## 2017-01-27 NOTE — Care Management Note (Signed)
Case Management Note  Patient Details  Name: [name] [name] MRN: [id number] Date of Birth: 1946/11/05  If discussed at Long Length of Stay Meetings, dates discussed:  01/27/2017   Sherald Barge, RN 01/27/2017, 10:34 AM

## 2017-01-27 NOTE — Care Management Important Message (Signed)
Important Message  Patient Details  Name: [name] [name] MRN: [id number] Date of Birth: [birth year]   Medicare Important Message Given:  Yes    Sherald Barge, RN 01/27/2017, 9:00 AM

## 2017-01-27 NOTE — Progress Notes (Signed)
Patient being discharged back to [facility].  Patient and family aware and agreeable.  Report called and given to [place].  All belongings sent with patient, patient in NAD.
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
## 2017-01-27 NOTE — Care Management Note (Signed)
Case Management Note  Patient Details  Name: [name] [name] MRN: [id number] Date of Birth: [birth year]  Subjective/Objective:                  Pt admitted with COPD. [name] from [facility] SNF. Plan is to return to SNF at [place] has made arrangements for return to facility today.   Action/Plan: Pt returning to SNF today. No CM needs.   Expected Discharge Date:  01/27/17               Expected Discharge Plan:  [place]  In-House Referral:  Clinical Social Work  Discharge planning Services  CM Consult  Post Acute Care Choice:  NA Choice offered to:  NA  Status of Service:  Completed, signed off  Sherald Barge, RN 01/27/2017, 9:33 AM

## 2017-01-27 NOTE — Progress Notes (Signed)
Subjective: He says he feels better. No new complaints. His breathing is better. Chest x-ray done yesterday that I personally reviewed does not show any infiltrate or volume overload. He did better with physical therapy yesterday.  Objective: Vital signs in last 24 hours: Temp:  [98.2 F (36.8 C)-98.5 F (36.9 C)] 98.2 F (36.8 C) (02/20 0647) Pulse Rate:  [78-90] 78 (02/20 0647) Resp:  [18-20] 20 (02/20 0647) BP: (122-130)/(52-63) 122/60 (02/20 0647) SpO2:  [95 %-100 %] 95 % (02/20 0817) Weight change:  Last BM Date: 01/24/17  Intake/Output from previous day: 02/19 0701 - 02/20 0700 In: 240 [P.O.:240] Out: 1250 [Urine:1250]  PHYSICAL EXAM General appearance: alert, cooperative and no distress Resp: clear to auscultation bilaterally Cardio: regular rate and rhythm, S1, S2 normal, no murmur, click, rub or gallop GI: soft, non-tender; bowel sounds normal; no masses,  no organomegaly Extremities: venous stasis dermatitis noted Skin warm and dry. His membranes are moist  Lab Results:  Results for orders placed or performed during the hospital encounter of 01/22/17 (from the past 48 hour(s))  Glucose, capillary     Status: None   Collection Time: 01/25/17 11:23 AM  Result Value Ref Range   Glucose-Capillary 92 65 - 99 mg/dL   Comment 1 Notify RN    Comment 2 Document in Chart   Glucose, capillary     Status: Abnormal   Collection Time: 01/25/17  4:44 PM  Result Value Ref Range   Glucose-Capillary 133 (H) 65 - 99 mg/dL   Comment 1 Notify RN    Comment 2 Document in Chart   Glucose, capillary     Status: None   Collection Time: 01/25/17  9:43 PM  Result Value Ref Range   Glucose-Capillary 98 65 - 99 mg/dL   Comment 1 Notify RN    Comment 2 Document in Chart   Basic metabolic panel     Status: Abnormal   Collection Time: 01/26/17  5:48 AM  Result Value Ref Range   Sodium 135 135 - 145 mmol/L   Potassium 3.7 3.5 - 5.1 mmol/L   Chloride 101 101 - 111 mmol/L   CO2 21 (L)  22 - 32 mmol/L   Glucose, Bld 93 65 - 99 mg/dL   BUN 94 (H) 6 - 20 mg/dL   Creatinine, Ser 3.09 (H) 0.61 - 1.24 mg/dL   Calcium 8.8 (L) 8.9 - 10.3 mg/dL   GFR calc non Af Amer 19 (L) >60 mL/min   GFR calc Af Amer 22 (L) >60 mL/min    Comment: (NOTE) The eGFR has been calculated using the CKD EPI equation. This calculation has not been validated in all clinical situations. eGFR's persistently <60 mL/min signify possible Chronic Kidney Disease.    Anion gap 13 5 - 15  Glucose, capillary     Status: None   Collection Time: 01/26/17  8:32 AM  Result Value Ref Range   Glucose-Capillary 86 65 - 99 mg/dL   Comment 1 Notify RN    Comment 2 Document in Chart   Glucose, capillary     Status: Abnormal   Collection Time: 01/26/17 12:17 PM  Result Value Ref Range   Glucose-Capillary 111 (H) 65 - 99 mg/dL   Comment 1 Notify RN    Comment 2 Document in Chart   Glucose, capillary     Status: Abnormal   Collection Time: 01/26/17  4:23 PM  Result Value Ref Range   Glucose-Capillary 124 (H) 65 - 99 mg/dL   Comment  1 Notify RN    Comment 2 Document in Chart   Glucose, capillary     Status: Abnormal   Collection Time: 01/26/17  8:53 PM  Result Value Ref Range   Glucose-Capillary 111 (H) 65 - 99 mg/dL   Comment 1 Notify RN    Comment 2 Document in Chart   Glucose, capillary     Status: None   Collection Time: 01/27/17  7:49 AM  Result Value Ref Range   Glucose-Capillary 94 65 - 99 mg/dL   Comment 1 Notify RN     ABGS No results for input(s): PHART, PO2ART, TCO2, HCO3 in the last 72 hours.  Invalid input(s): PCO2 CULTURES Recent Results (from the past 240 hour(s))  Urine culture     Status: None   Collection Time: 01/22/17 12:17 PM  Result Value Ref Range Status   Specimen Description URINE, CATHETERIZED  Final   Special Requests NONE  Final   Culture   Final    NO GROWTH Performed at [facility], [address], [place], [place] [address]    Report Status 01/24/2017 FINAL   Final  MRSA PCR Screening     Status: None   Collection Time: 01/22/17  9:45 PM  Result Value Ref Range Status   MRSA by PCR NEGATIVE NEGATIVE Final    Comment:        The GeneXpert MRSA Assay (FDA approved for NASAL specimens only), is one component of a comprehensive MRSA colonization surveillance program. It is not intended to diagnose MRSA infection nor to guide or monitor treatment for MRSA infections.    Studies/Results: Dg Chest Port 1 View  Result Date: 01/26/2017 CLINICAL DATA:  Shortness of Breath.  Hypertension. EXAM: PORTABLE CHEST 1 VIEW COMPARISON:  January 22, 2017 FINDINGS: No edema or consolidation. Stable cardiomegaly. Pulmonary vascularity is normal. Aorta is mildly prominent but stable. No adenopathy. No bone lesions. IMPRESSION: Cardiomegaly. Prominent aorta, likely due to chronic hypertension. No edema or consolidation. Electronically Signed   By: Lowella Grip III M.D.   On: 01/26/2017 09:24    Medications:  Prior to Admission:  Prescriptions Prior to Admission  Medication Sig Dispense Refill Last Dose  . acetaminophen (TYLENOL) 325 MG tablet Take 650 mg by mouth every 6 (six) hours as needed.   01/21/2017 at Unknown time  . amLODipine (NORVASC) 10 MG tablet Take 1 tablet (10 mg total) by mouth daily. 30 tablet 2 01/21/2017 at Unknown time  . calcitRIOL (ROCALTROL) 0.5 MCG capsule Take 0.5 mcg by mouth daily.   01/21/2017 at Unknown time  . desmopressin (DDAVP) 0.2 MG tablet Take 1 tablet (0.2 mg total) by mouth 2 (two) times daily. 60 tablet 12 01/21/2017 at Unknown time  . HYDROcodone-acetaminophen (NORCO/VICODIN) 5-325 MG tablet Take one tablet by mouth every 4 hours as needed for moderate pain. Max APAP 3gm/24 hours 180 tablet 0 01/21/2017 at Unknown time  . ipratropium-albuterol (DUONEB) 0.5-2.5 (3) MG/3ML SOLN Take 3 mLs by nebulization every 4 (four) hours as needed.   01/22/2017 at Unknown time  . isosorbide-hydrALAZINE (BIDIL) 20-37.5 MG tablet Take 1  tablet by mouth 3 (three) times daily. 90 tablet 3 01/21/2017 at Unknown time  . levothyroxine (SYNTHROID, LEVOTHROID) 150 MCG tablet Take 1 tablet (150 mcg total) by mouth daily before breakfast. 30 tablet 12 01/21/2017 at Unknown time  . OXYGEN 02 @_0  L pm via nasal cannula every shift   Taking  . phenytoin (DILANTIN) 100 MG ER capsule Take 100 mg  by mouth 3 (three) times daily.   01/21/2017 at Unknown time  . torsemide (DEMADEX) 20 MG tablet Take 40 mg by mouth 2 (two) times daily.    01/21/2017 at Unknown time   Scheduled: . calcitRIOL  0.5 mcg Oral Daily  . desmopressin  0.2 mg Oral BID  . dexamethasone  2 mg Oral Q8H  . heparin  5,000 Units Subcutaneous Q8H  . insulin aspart  0-9 Units Subcutaneous TID WC  . ipratropium-albuterol  3 mL Nebulization QID  . isosorbide-hydrALAZINE  1 tablet Oral TID  . levothyroxine  150 mcg Oral QAC breakfast  . phenytoin  100 mg Oral TID  . torsemide  40 mg Oral BID   Continuous:  BOB:[id number], HYDROcodone-acetaminophen  Assesment: He was admitted with COPD exacerbation. He was complaining of left upper quadrant abdominal pain and no source of that pain has been found. His breathing is better. [name] ready for transfer back to the skilled care facility. He's not willing to commit to using CPAP and says he would consider it. 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

## 2017-01-28 ENCOUNTER — Other Ambulatory Visit: Payer: Self-pay | Admitting: *Deleted

## 2017-01-28 MED ORDER — HYDROCODONE-ACETAMINOPHEN 5-325 MG PO TABS: ORAL_TABLET | ORAL | 0 refills | Status: DC

## 2017-01-28 NOTE — Telephone Encounter (Signed)
[facility] #[phone] Fax: [phone]   

## 2017-02-03 ENCOUNTER — Encounter: Payer: Self-pay | Admitting: Internal Medicine

## 2017-02-03 ENCOUNTER — Non-Acute Institutional Stay (SKILLED_NURSING_FACILITY): Payer: Medicare HMO | Admitting: Internal Medicine

## 2017-02-03 ENCOUNTER — Encounter (HOSPITAL_COMMUNITY)
Admission: RE | Admit: 2017-02-03 | Discharge: 2017-02-03 | Disposition: A | Discharge status: Home / Self Care | Payer: Medicare HMO | Source: Skilled Nursing Facility | Attending: Internal Medicine | Admitting: Internal Medicine

## 2017-02-03 DIAGNOSIS — J9610 Chronic respiratory failure, unspecified whether with hypoxia or hypercapnia: Secondary | ICD-10-CM | POA: Insufficient documentation

## 2017-02-03 DIAGNOSIS — E230 Hypopituitarism: Secondary | ICD-10-CM

## 2017-02-03 DIAGNOSIS — I5042 Chronic combined systolic (congestive) and diastolic (congestive) heart failure: Secondary | ICD-10-CM | POA: Diagnosis not present

## 2017-02-03 DIAGNOSIS — J449 Chronic obstructive pulmonary disease, unspecified: Secondary | ICD-10-CM | POA: Insufficient documentation

## 2017-02-03 DIAGNOSIS — Z905 Acquired absence of kidney: Secondary | ICD-10-CM | POA: Insufficient documentation

## 2017-02-03 DIAGNOSIS — N184 Chronic kidney disease, stage 4 (severe): Secondary | ICD-10-CM | POA: Insufficient documentation

## 2017-02-03 DIAGNOSIS — J441 Chronic obstructive pulmonary disease with (acute) exacerbation: Secondary | ICD-10-CM | POA: Diagnosis not present

## 2017-02-03 DIAGNOSIS — R279 Unspecified lack of coordination: Secondary | ICD-10-CM | POA: Insufficient documentation

## 2017-02-03 LAB — BASIC METABOLIC PANEL
Anion gap: 11 (ref 5–15)
BUN: 114 mg/dL — AB (ref 6–20)
CO2: 28 mmol/L (ref 22–32)
CREATININE: 2.67 mg/dL — AB (ref 0.61–1.24)
Calcium: 9.4 mg/dL (ref 8.9–10.3)
Chloride: 95 mmol/L — ABNORMAL LOW (ref 101–111)
GFR calc non Af Amer: 22 mL/min — ABNORMAL LOW (ref >60)
GFR, EST AFRICAN AMERICAN: 26 mL/min — AB (ref >60)
Glucose, Bld: 89 mg/dL (ref 65–99)
Potassium: 4.1 mmol/L (ref 3.5–5.1)
Sodium: 134 mmol/L — ABNORMAL LOW (ref 135–145)

## 2017-02-03 LAB — CBC WITH DIFFERENTIAL/PLATELET
Basophils Absolute: 0.1 10*3/uL (ref 0.0–0.1)
Basophils Relative: 1 %
Eosinophils Absolute: 0.6 10*3/uL (ref 0.0–0.7)
Eosinophils Relative: 6 %
HEMATOCRIT: 32.4 % — AB (ref 39.0–52.0)
HEMOGLOBIN: 10.6 g/dL — AB (ref 13.0–17.0)
LYMPHS ABS: 1.8 10*3/uL (ref 0.7–4.0)
Lymphocytes Relative: 16 %
MCH: 30.3 pg (ref 26.0–34.0)
MCHC: 32.7 g/dL (ref 30.0–36.0)
MCV: 92.6 fL (ref 78.0–100.0)
MONOS PCT: 7 %
Monocytes Absolute: 0.8 10*3/uL (ref 0.1–1.0)
NEUTROS ABS: 8.1 10*3/uL — AB (ref 1.7–7.7)
NEUTROS PCT: 70 %
Platelets: 271 10*3/uL (ref 150–400)
RBC: 3.5 MIL/uL — ABNORMAL LOW (ref 4.22–5.81)
RDW: 15.5 % (ref 11.5–15.5)
WBC: 11.5 10*3/uL — ABNORMAL HIGH (ref 4.0–10.5)
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
## 2017-02-06 ENCOUNTER — Encounter: Payer: Self-pay | Admitting: Internal Medicine

## 2017-02-06 ENCOUNTER — Non-Acute Institutional Stay (SKILLED_NURSING_FACILITY): Payer: Medicare HMO | Admitting: Internal Medicine

## 2017-02-06 ENCOUNTER — Encounter (HOSPITAL_COMMUNITY)
Admission: AD | Admit: 2017-02-06 | Discharge: 2017-02-06 | Disposition: A | Discharge status: Home / Self Care | Payer: Medicare HMO | Source: Skilled Nursing Facility | Attending: Internal Medicine | Admitting: Internal Medicine

## 2017-02-06 DIAGNOSIS — J309 Allergic rhinitis, unspecified: Secondary | ICD-10-CM

## 2017-02-06 DIAGNOSIS — I5042 Chronic combined systolic (congestive) and diastolic (congestive) heart failure: Secondary | ICD-10-CM

## 2017-02-06 DIAGNOSIS — N184 Chronic kidney disease, stage 4 (severe): Secondary | ICD-10-CM | POA: Diagnosis not present

## 2017-02-06 LAB — CBC WITH DIFFERENTIAL/PLATELET
BASOS PCT: 1 %
Basophils Absolute: 0.1 10*3/uL (ref 0.0–0.1)
EOS ABS: 0.8 10*3/uL — AB (ref 0.0–0.7)
Eosinophils Relative: 10 %
HEMATOCRIT: 30.6 % — AB (ref 39.0–52.0)
HEMOGLOBIN: 10 g/dL — AB (ref 13.0–17.0)
LYMPHS ABS: 1.2 10*3/uL (ref 0.7–4.0)
Lymphocytes Relative: 15 %
MCH: 30.1 pg (ref 26.0–34.0)
MCHC: 32.7 g/dL (ref 30.0–36.0)
MCV: 92.2 fL (ref 78.0–100.0)
Monocytes Absolute: 1 10*3/uL (ref 0.1–1.0)
Monocytes Relative: 12 %
NEUTROS ABS: 5 10*3/uL (ref 1.7–7.7)
NEUTROS PCT: 62 %
Platelets: 193 10*3/uL (ref 150–400)
RBC: 3.32 MIL/uL — AB (ref 4.22–5.81)
RDW: 15.4 % (ref 11.5–15.5)
WBC: 8.1 10*3/uL (ref 4.0–10.5)

## 2017-02-06 LAB — BASIC METABOLIC PANEL
Anion gap: 13 (ref 5–15)
BUN: 130 mg/dL — ABNORMAL HIGH (ref 6–20)
CHLORIDE: 95 mmol/L — AB (ref 101–111)
CO2: 28 mmol/L (ref 22–32)
Calcium: 9.1 mg/dL (ref 8.9–10.3)
Creatinine, Ser: 2.78 mg/dL — ABNORMAL HIGH (ref 0.61–1.24)
GFR calc non Af Amer: 21 mL/min — ABNORMAL LOW (ref >60)
GFR, EST AFRICAN AMERICAN: 25 mL/min — AB (ref >60)
Glucose, Bld: 101 mg/dL — ABNORMAL HIGH (ref 65–99)
POTASSIUM: 3.8 mmol/L (ref 3.5–5.1)
SODIUM: 136 mmol/L (ref 135–145)
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
## 2017-02-09 ENCOUNTER — Non-Acute Institutional Stay (SKILLED_NURSING_FACILITY): Payer: Medicare HMO | Admitting: Internal Medicine

## 2017-02-09 ENCOUNTER — Encounter (HOSPITAL_COMMUNITY)
Admission: RE | Admit: 2017-02-09 | Discharge: 2017-02-09 | Disposition: A | Discharge status: Home / Self Care | Payer: Medicare HMO | Source: Skilled Nursing Facility | Attending: Internal Medicine | Admitting: Internal Medicine

## 2017-02-09 DIAGNOSIS — M25561 Pain in right knee: Secondary | ICD-10-CM

## 2017-02-09 DIAGNOSIS — I5042 Chronic combined systolic (congestive) and diastolic (congestive) heart failure: Secondary | ICD-10-CM

## 2017-02-09 DIAGNOSIS — J9610 Chronic respiratory failure, unspecified whether with hypoxia or hypercapnia: Secondary | ICD-10-CM | POA: Insufficient documentation

## 2017-02-09 DIAGNOSIS — N184 Chronic kidney disease, stage 4 (severe): Secondary | ICD-10-CM

## 2017-02-09 DIAGNOSIS — J449 Chronic obstructive pulmonary disease, unspecified: Secondary | ICD-10-CM | POA: Insufficient documentation

## 2017-02-09 LAB — CBC
HEMATOCRIT: 30.8 % — AB (ref 39.0–52.0)
HEMOGLOBIN: 10 g/dL — AB (ref 13.0–17.0)
MCH: 30.2 pg (ref 26.0–34.0)
MCHC: 32.5 g/dL (ref 30.0–36.0)
MCV: 93.1 fL (ref 78.0–100.0)
Platelets: 164 10*3/uL (ref 150–400)
RBC: 3.31 MIL/uL — ABNORMAL LOW (ref 4.22–5.81)
RDW: 15.3 % (ref 11.5–15.5)
WBC: 6.9 10*3/uL (ref 4.0–10.5)

## 2017-02-09 LAB — BASIC METABOLIC PANEL
Anion gap: 10 (ref 5–15)
BUN: 112 mg/dL — AB (ref 6–20)
CALCIUM: 9.1 mg/dL (ref 8.9–10.3)
CHLORIDE: 98 mmol/L — AB (ref 101–111)
CO2: 28 mmol/L (ref 22–32)
CREATININE: 2.66 mg/dL — AB (ref 0.61–1.24)
GFR calc Af Amer: 26 mL/min — ABNORMAL LOW (ref >60)
GFR calc non Af Amer: 22 mL/min — ABNORMAL LOW (ref >60)
Glucose, Bld: 120 mg/dL — ABNORMAL HIGH (ref 65–99)
Potassium: 3.7 mmol/L (ref 3.5–5.1)
SODIUM: 136 mmol/L (ref 135–145)

## 2017-02-09 LAB — PROTEIN / CREATININE RATIO, URINE
Creatinine, Urine: 73.42 mg/dL
Protein Creatinine Ratio: 0.15 mg/mg{Cre} (ref 0.00–0.15)
Total Protein, Urine: 11 mg/dL
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
## 2017-02-10 ENCOUNTER — Emergency Department (HOSPITAL_COMMUNITY)
Admission: EM | Admit: 2017-02-10 | Discharge: 2017-02-10 | Disposition: A | Discharge status: Home / Self Care | Payer: Medicare HMO | Attending: Emergency Medicine | Admitting: Emergency Medicine

## 2017-02-10 ENCOUNTER — Emergency Department (HOSPITAL_COMMUNITY): Payer: Medicare HMO

## 2017-02-10 ENCOUNTER — Non-Acute Institutional Stay (SKILLED_NURSING_FACILITY): Payer: Medicare HMO | Admitting: Internal Medicine

## 2017-02-10 ENCOUNTER — Encounter (HOSPITAL_COMMUNITY): Payer: Self-pay | Admitting: Emergency Medicine

## 2017-02-10 ENCOUNTER — Encounter: Payer: Self-pay | Admitting: Internal Medicine

## 2017-02-10 DIAGNOSIS — N184 Chronic kidney disease, stage 4 (severe): Secondary | ICD-10-CM | POA: Insufficient documentation

## 2017-02-10 DIAGNOSIS — J449 Chronic obstructive pulmonary disease, unspecified: Secondary | ICD-10-CM | POA: Diagnosis not present

## 2017-02-10 DIAGNOSIS — E039 Hypothyroidism, unspecified: Secondary | ICD-10-CM | POA: Insufficient documentation

## 2017-02-10 DIAGNOSIS — I351 Nonrheumatic aortic (valve) insufficiency: Secondary | ICD-10-CM | POA: Diagnosis not present

## 2017-02-10 DIAGNOSIS — G939 Disorder of brain, unspecified: Secondary | ICD-10-CM | POA: Insufficient documentation

## 2017-02-10 DIAGNOSIS — R900 Intracranial space-occupying lesion found on diagnostic imaging of central nervous system: Secondary | ICD-10-CM

## 2017-02-10 DIAGNOSIS — I130 Hypertensive heart and chronic kidney disease with heart failure and stage 1 through stage 4 chronic kidney disease, or unspecified chronic kidney disease: Secondary | ICD-10-CM | POA: Insufficient documentation

## 2017-02-10 DIAGNOSIS — R0602 Shortness of breath: Secondary | ICD-10-CM | POA: Insufficient documentation

## 2017-02-10 DIAGNOSIS — I5042 Chronic combined systolic (congestive) and diastolic (congestive) heart failure: Secondary | ICD-10-CM

## 2017-02-10 DIAGNOSIS — R51 Headache: Secondary | ICD-10-CM | POA: Diagnosis not present

## 2017-02-10 DIAGNOSIS — M79602 Pain in left arm: Secondary | ICD-10-CM | POA: Insufficient documentation

## 2017-02-10 DIAGNOSIS — Z79899 Other long term (current) drug therapy: Secondary | ICD-10-CM | POA: Insufficient documentation

## 2017-02-10 DIAGNOSIS — R42 Dizziness and giddiness: Secondary | ICD-10-CM

## 2017-02-10 DIAGNOSIS — E1122 Type 2 diabetes mellitus with diabetic chronic kidney disease: Secondary | ICD-10-CM | POA: Diagnosis not present

## 2017-02-10 DIAGNOSIS — R079 Chest pain, unspecified: Secondary | ICD-10-CM | POA: Diagnosis not present

## 2017-02-10 DIAGNOSIS — Z87891 Personal history of nicotine dependence: Secondary | ICD-10-CM | POA: Insufficient documentation

## 2017-02-10 LAB — COMPREHENSIVE METABOLIC PANEL
ALBUMIN: 3.3 g/dL — AB (ref 3.5–5.0)
ALK PHOS: 42 U/L (ref 38–126)
ALT: 19 U/L (ref 17–63)
ANION GAP: 11 (ref 5–15)
AST: 18 U/L (ref 15–41)
BUN: 109 mg/dL — ABNORMAL HIGH (ref 6–20)
CALCIUM: 9.1 mg/dL (ref 8.9–10.3)
CHLORIDE: 99 mmol/L — AB (ref 101–111)
CO2: 26 mmol/L (ref 22–32)
Creatinine, Ser: 2.65 mg/dL — ABNORMAL HIGH (ref 0.61–1.24)
GFR calc Af Amer: 26 mL/min — ABNORMAL LOW (ref >60)
GFR calc non Af Amer: 23 mL/min — ABNORMAL LOW (ref >60)
GLUCOSE: 103 mg/dL — AB (ref 65–99)
POTASSIUM: 4.1 mmol/L (ref 3.5–5.1)
SODIUM: 136 mmol/L (ref 135–145)
Total Bilirubin: 0.4 mg/dL (ref 0.3–1.2)
Total Protein: 7.4 g/dL (ref 6.5–8.1)

## 2017-02-10 LAB — CBC WITH DIFFERENTIAL/PLATELET
BASOS PCT: 1 %
Basophils Absolute: 0.1 10*3/uL (ref 0.0–0.1)
EOS ABS: 0.6 10*3/uL (ref 0.0–0.7)
Eosinophils Relative: 7 %
HCT: 29.3 % — ABNORMAL LOW (ref 39.0–52.0)
HEMOGLOBIN: 9.5 g/dL — AB (ref 13.0–17.0)
Lymphocytes Relative: 8 %
Lymphs Abs: 0.7 10*3/uL (ref 0.7–4.0)
MCH: 30 pg (ref 26.0–34.0)
MCHC: 32.4 g/dL (ref 30.0–36.0)
MCV: 92.4 fL (ref 78.0–100.0)
MONOS PCT: 12 %
Monocytes Absolute: 1 10*3/uL (ref 0.1–1.0)
NEUTROS PCT: 72 %
Neutro Abs: 6.2 10*3/uL (ref 1.7–7.7)
PLATELETS: 156 10*3/uL (ref 150–400)
RBC: 3.17 MIL/uL — ABNORMAL LOW (ref 4.22–5.81)
RDW: 15.4 % (ref 11.5–15.5)
WBC: 8.4 10*3/uL (ref 4.0–10.5)

## 2017-02-10 LAB — PARATHYROID HORMONE, INTACT (NO CA): PTH: 129 pg/mL — ABNORMAL HIGH (ref 15–65)

## 2017-02-10 LAB — PHENYTOIN LEVEL, TOTAL: PHENYTOIN LVL: 10.9 ug/mL (ref 10.0–20.0)

## 2017-02-10 LAB — CK: Total CK: 40 U/L — ABNORMAL LOW (ref 49–397)

## 2017-02-10 LAB — VITAMIN D 25 HYDROXY (VIT D DEFICIENCY, FRACTURES): Vit D, 25-Hydroxy: 15.9 ng/mL — ABNORMAL LOW (ref 30.0–100.0)

## 2017-02-10 LAB — TROPONIN I
TROPONIN I: 0.05 ng/mL — AB (ref <0.03)
Troponin I: 0.06 ng/mL (ref <0.03)

## 2017-02-10 MED ORDER — ALBUTEROL SULFATE (2.5 MG/3ML) 0.083% IN NEBU
5.0000 mg | INHALATION_SOLUTION | Freq: Once | RESPIRATORY_TRACT | Status: AC
  Administered 2017-02-10: 5 mg via RESPIRATORY_TRACT
  Filled 2017-02-10: qty 6

## 2017-02-10 NOTE — Discharge Instructions (Signed)
Call and make an appointment to follow-up with your neurosurgeon. Return immediately for increased weakness, numbness, visual changes or for any concerns.

## 2017-02-10 NOTE — ED Notes (Signed)
MRI brought pt back stated he was too big for the machine. EDP aware. Nad.

## 2017-02-10 NOTE — ED Notes (Signed)
Pt updated on plan of care, Dr Lita Mains aware that pt is requesting to speak with him,

## 2017-02-10 NOTE — ED Notes (Signed)
ED Provider at bedside. 

## 2017-02-10 NOTE — ED Notes (Signed)
CRITICAL VALUE ALERT  Critical value received:  0.06 troponin  Date of notification:  02/10/17  Time of notification:  [id number]  Critical value read back:Yes.    Nurse who received alert:  c Zoha Spranger  Responding MD:  Dr. Lita Mains  Time MD responded:  [phone]

## 2017-02-10 NOTE — ED Triage Notes (Signed)
Pt resident of [facility],pt brought over for evaluation of left wrist pain that radiates to left shoulder. Pt denies any known injury. Pt reports pain at rest. No deformity noted.
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
## 2017-02-10 NOTE — ED Notes (Signed)
Pt expressed understanding of discharge instructions, states ' I am not going to [facility] for follow up" RN recommended pt to consult with neuro for follow up,

## 2017-02-10 NOTE — ED Notes (Signed)
Pt states that he is feeling better, denies any complaints, states 'I am ready to go back to my room" update given,

## 2017-02-10 NOTE — ED Notes (Signed)
Pt returned from ct

## 2017-02-10 NOTE — ED Notes (Signed)
Report given to staff at [facility],
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
## 2017-02-10 NOTE — ED Notes (Signed)
Pt taken to MRI  

## 2017-02-11 DIAGNOSIS — D509 Iron deficiency anemia, unspecified: Secondary | ICD-10-CM | POA: Diagnosis not present

## 2017-02-11 DIAGNOSIS — G4730 Sleep apnea, unspecified: Secondary | ICD-10-CM | POA: Diagnosis not present

## 2017-02-11 DIAGNOSIS — I10 Essential (primary) hypertension: Secondary | ICD-10-CM | POA: Diagnosis not present

## 2017-02-11 DIAGNOSIS — J449 Chronic obstructive pulmonary disease, unspecified: Secondary | ICD-10-CM | POA: Diagnosis not present

## 2017-02-11 DIAGNOSIS — N184 Chronic kidney disease, stage 4 (severe): Secondary | ICD-10-CM | POA: Diagnosis not present

## 2017-02-11 DIAGNOSIS — I509 Heart failure, unspecified: Secondary | ICD-10-CM | POA: Diagnosis not present

## 2017-02-11 DIAGNOSIS — E1129 Type 2 diabetes mellitus with other diabetic kidney complication: Secondary | ICD-10-CM | POA: Diagnosis not present

## 2017-02-13 ENCOUNTER — Other Ambulatory Visit (HOSPITAL_COMMUNITY)
Admission: RE | Admit: 2017-02-13 | Discharge: 2017-02-13 | Disposition: A | Discharge status: Home / Self Care | Payer: Medicare HMO | Source: Skilled Nursing Facility | Attending: Internal Medicine | Admitting: Internal Medicine

## 2017-02-13 DIAGNOSIS — D509 Iron deficiency anemia, unspecified: Secondary | ICD-10-CM | POA: Insufficient documentation

## 2017-02-13 LAB — OCCULT BLOOD X 1 CARD TO LAB, STOOL: Fecal Occult Bld: NEGATIVE

## 2017-02-16 ENCOUNTER — Emergency Department (HOSPITAL_COMMUNITY): Payer: Medicare HMO

## 2017-02-16 ENCOUNTER — Encounter (HOSPITAL_COMMUNITY): Payer: Self-pay | Admitting: *Deleted

## 2017-02-16 ENCOUNTER — Observation Stay (HOSPITAL_COMMUNITY)
Admission: EM | Admit: 2017-02-16 | Discharge: 2017-02-18 | Disposition: A | Discharge status: Home / Self Care | Payer: Medicare HMO | Attending: Internal Medicine | Admitting: Internal Medicine

## 2017-02-16 ENCOUNTER — Observation Stay (HOSPITAL_BASED_OUTPATIENT_CLINIC_OR_DEPARTMENT_OTHER): Payer: Medicare HMO

## 2017-02-16 DIAGNOSIS — I130 Hypertensive heart and chronic kidney disease with heart failure and stage 1 through stage 4 chronic kidney disease, or unspecified chronic kidney disease: Secondary | ICD-10-CM | POA: Diagnosis not present

## 2017-02-16 DIAGNOSIS — N184 Chronic kidney disease, stage 4 (severe): Secondary | ICD-10-CM | POA: Diagnosis present

## 2017-02-16 DIAGNOSIS — I5033 Acute on chronic diastolic (congestive) heart failure: Secondary | ICD-10-CM | POA: Diagnosis present

## 2017-02-16 DIAGNOSIS — E039 Hypothyroidism, unspecified: Secondary | ICD-10-CM | POA: Diagnosis present

## 2017-02-16 DIAGNOSIS — I509 Heart failure, unspecified: Secondary | ICD-10-CM | POA: Diagnosis not present

## 2017-02-16 DIAGNOSIS — E033 Postinfectious hypothyroidism: Secondary | ICD-10-CM | POA: Diagnosis not present

## 2017-02-16 DIAGNOSIS — R569 Unspecified convulsions: Secondary | ICD-10-CM

## 2017-02-16 DIAGNOSIS — J9611 Chronic respiratory failure with hypoxia: Secondary | ICD-10-CM

## 2017-02-16 DIAGNOSIS — I5021 Acute systolic (congestive) heart failure: Secondary | ICD-10-CM | POA: Insufficient documentation

## 2017-02-16 DIAGNOSIS — I1310 Hypertensive heart and chronic kidney disease without heart failure, with stage 1 through stage 4 chronic kidney disease, or unspecified chronic kidney disease: Secondary | ICD-10-CM | POA: Diagnosis not present

## 2017-02-16 DIAGNOSIS — Z87891 Personal history of nicotine dependence: Secondary | ICD-10-CM | POA: Diagnosis not present

## 2017-02-16 DIAGNOSIS — R079 Chest pain, unspecified: Secondary | ICD-10-CM | POA: Diagnosis not present

## 2017-02-16 DIAGNOSIS — J441 Chronic obstructive pulmonary disease with (acute) exacerbation: Secondary | ICD-10-CM | POA: Diagnosis present

## 2017-02-16 DIAGNOSIS — I10 Essential (primary) hypertension: Secondary | ICD-10-CM | POA: Diagnosis present

## 2017-02-16 DIAGNOSIS — Z79899 Other long term (current) drug therapy: Secondary | ICD-10-CM | POA: Insufficient documentation

## 2017-02-16 DIAGNOSIS — Z7984 Long term (current) use of oral hypoglycemic drugs: Secondary | ICD-10-CM | POA: Insufficient documentation

## 2017-02-16 DIAGNOSIS — J9610 Chronic respiratory failure, unspecified whether with hypoxia or hypercapnia: Secondary | ICD-10-CM | POA: Diagnosis present

## 2017-02-16 DIAGNOSIS — R0602 Shortness of breath: Secondary | ICD-10-CM | POA: Diagnosis not present

## 2017-02-16 DIAGNOSIS — E1122 Type 2 diabetes mellitus with diabetic chronic kidney disease: Secondary | ICD-10-CM | POA: Diagnosis not present

## 2017-02-16 DIAGNOSIS — R05 Cough: Secondary | ICD-10-CM | POA: Diagnosis not present

## 2017-02-16 LAB — BASIC METABOLIC PANEL
Anion gap: 9 (ref 5–15)
BUN: 81 mg/dL — AB (ref 6–20)
CALCIUM: 9.3 mg/dL (ref 8.9–10.3)
CHLORIDE: 103 mmol/L (ref 101–111)
CO2: 25 mmol/L (ref 22–32)
CREATININE: 2.38 mg/dL — AB (ref 0.61–1.24)
GFR calc Af Amer: 30 mL/min — ABNORMAL LOW (ref >60)
GFR calc non Af Amer: 26 mL/min — ABNORMAL LOW (ref >60)
Glucose, Bld: 136 mg/dL — ABNORMAL HIGH (ref 65–99)
Potassium: 4.2 mmol/L (ref 3.5–5.1)
SODIUM: 137 mmol/L (ref 135–145)

## 2017-02-16 LAB — CBC WITH DIFFERENTIAL/PLATELET
Basophils Absolute: 0.1 10*3/uL (ref 0.0–0.1)
Basophils Relative: 1 %
EOS ABS: 1.2 10*3/uL — AB (ref 0.0–0.7)
EOS PCT: 16 %
HCT: 29.2 % — ABNORMAL LOW (ref 39.0–52.0)
Hemoglobin: 9.6 g/dL — ABNORMAL LOW (ref 13.0–17.0)
LYMPHS ABS: 1 10*3/uL (ref 0.7–4.0)
Lymphocytes Relative: 14 %
MCH: 30.5 pg (ref 26.0–34.0)
MCHC: 32.9 g/dL (ref 30.0–36.0)
MCV: 92.7 fL (ref 78.0–100.0)
Monocytes Absolute: 0.6 10*3/uL (ref 0.1–1.0)
Monocytes Relative: 8 %
Neutro Abs: 4.4 10*3/uL (ref 1.7–7.7)
Neutrophils Relative %: 61 %
Platelets: 136 10*3/uL — ABNORMAL LOW (ref 150–400)
RBC: 3.15 MIL/uL — AB (ref 4.22–5.81)
RDW: 14.9 % (ref 11.5–15.5)
WBC: 7.2 10*3/uL (ref 4.0–10.5)

## 2017-02-16 LAB — PHENYTOIN LEVEL, TOTAL: PHENYTOIN LVL: 12.9 ug/mL (ref 10.0–20.0)

## 2017-02-16 LAB — ECHOCARDIOGRAM COMPLETE
HEIGHTINCHES: 73 in
Weight: 3822.4 oz

## 2017-02-16 LAB — BRAIN NATRIURETIC PEPTIDE: B Natriuretic Peptide: 597 pg/mL — ABNORMAL HIGH (ref 0.0–100.0)

## 2017-02-16 MED ORDER — HYDROCODONE-ACETAMINOPHEN 5-325 MG PO TABS: 1.0000 | ORAL_TABLET | ORAL | Status: DC | PRN

## 2017-02-16 MED ORDER — LEVOTHYROXINE SODIUM 75 MCG PO TABS
150.0000 ug | ORAL_TABLET | Freq: Every day | ORAL | Status: DC
  Administered 2017-02-17 – 2017-02-18 (×2): 150 ug via ORAL
  Filled 2017-02-16 (×2): qty 2

## 2017-02-16 MED ORDER — LORATADINE 10 MG PO TABS
10.0000 mg | ORAL_TABLET | Freq: Every day | ORAL | Status: DC
  Administered 2017-02-16 – 2017-02-18 (×3): 10 mg via ORAL
  Filled 2017-02-16 (×3): qty 1

## 2017-02-16 MED ORDER — CALCITRIOL 0.25 MCG PO CAPS
0.5000 ug | ORAL_CAPSULE | Freq: Every day | ORAL | Status: DC
  Administered 2017-02-16 – 2017-02-18 (×3): 0.5 ug via ORAL
  Filled 2017-02-16 (×3): qty 2

## 2017-02-16 MED ORDER — ISOSORB DINITRATE-HYDRALAZINE 20-37.5 MG PO TABS
1.0000 | ORAL_TABLET | Freq: Three times a day (TID) | ORAL | Status: DC
  Administered 2017-02-16 – 2017-02-18 (×6): 1 via ORAL
  Filled 2017-02-16 (×13): qty 1

## 2017-02-16 MED ORDER — SODIUM CHLORIDE 0.9% FLUSH: 3.0000 mL | INTRAVENOUS | Status: DC | PRN

## 2017-02-16 MED ORDER — FUROSEMIDE 10 MG/ML IJ SOLN
40.0000 mg | Freq: Every day | INTRAMUSCULAR | Status: DC
  Administered 2017-02-16 – 2017-02-18 (×3): 40 mg via INTRAVENOUS
  Filled 2017-02-16 (×3): qty 4

## 2017-02-16 MED ORDER — PHENYTOIN SODIUM EXTENDED 100 MG PO CAPS
100.0000 mg | ORAL_CAPSULE | Freq: Three times a day (TID) | ORAL | Status: DC
  Administered 2017-02-16 – 2017-02-18 (×6): 100 mg via ORAL
  Filled 2017-02-16 (×6): qty 1

## 2017-02-16 MED ORDER — SODIUM CHLORIDE 0.9 % IV SOLN: 250.0000 mL | INTRAVENOUS | Status: DC | PRN

## 2017-02-16 MED ORDER — METHYLPREDNISOLONE SODIUM SUCC 125 MG IJ SOLR
60.0000 mg | Freq: Four times a day (QID) | INTRAMUSCULAR | Status: DC
  Administered 2017-02-16 – 2017-02-17 (×4): 60 mg via INTRAVENOUS
  Filled 2017-02-16 (×4): qty 2

## 2017-02-16 MED ORDER — IPRATROPIUM-ALBUTEROL 0.5-2.5 (3) MG/3ML IN SOLN
3.0000 mL | Freq: Four times a day (QID) | RESPIRATORY_TRACT | Status: DC
  Administered 2017-02-16 – 2017-02-18 (×8): 3 mL via RESPIRATORY_TRACT
  Filled 2017-02-16 (×8): qty 3

## 2017-02-16 MED ORDER — AMLODIPINE BESYLATE 5 MG PO TABS
10.0000 mg | ORAL_TABLET | Freq: Every day | ORAL | Status: DC
  Administered 2017-02-16 – 2017-02-18 (×3): 10 mg via ORAL
  Filled 2017-02-16 (×3): qty 2

## 2017-02-16 MED ORDER — METHYLPREDNISOLONE SODIUM SUCC 125 MG IJ SOLR
125.0000 mg | Freq: Once | INTRAMUSCULAR | Status: AC
  Administered 2017-02-16: 125 mg via INTRAVENOUS
  Filled 2017-02-16: qty 2

## 2017-02-16 MED ORDER — DESMOPRESSIN ACETATE 0.2 MG PO TABS
0.2000 mg | ORAL_TABLET | Freq: Two times a day (BID) | ORAL | Status: DC
  Administered 2017-02-16 – 2017-02-18 (×5): 0.2 mg via ORAL
  Filled 2017-02-16 (×9): qty 1

## 2017-02-16 MED ORDER — SODIUM CHLORIDE 0.9% FLUSH
3.0000 mL | Freq: Two times a day (BID) | INTRAVENOUS | Status: DC
  Administered 2017-02-16 – 2017-02-18 (×5): 3 mL via INTRAVENOUS

## 2017-02-16 MED ORDER — ACETAMINOPHEN 325 MG PO TABS: 650.0000 mg | ORAL_TABLET | ORAL | Status: DC | PRN

## 2017-02-16 MED ORDER — LEVOFLOXACIN 750 MG PO TABS
750.0000 mg | ORAL_TABLET | ORAL | Status: DC
  Administered 2017-02-16 – 2017-02-18 (×2): 750 mg via ORAL
  Filled 2017-02-16 (×3): qty 1

## 2017-02-16 MED ORDER — HEPARIN SODIUM (PORCINE) 5000 UNIT/ML IJ SOLN
5000.0000 [IU] | Freq: Three times a day (TID) | INTRAMUSCULAR | Status: DC
  Administered 2017-02-16 – 2017-02-18 (×6): 5000 [IU] via SUBCUTANEOUS
  Filled 2017-02-16 (×6): qty 1

## 2017-02-16 MED ORDER — GUAIFENESIN ER 600 MG PO TB12
1200.0000 mg | ORAL_TABLET | Freq: Two times a day (BID) | ORAL | Status: DC
  Administered 2017-02-16 – 2017-02-18 (×5): 1200 mg via ORAL
  Filled 2017-02-16 (×5): qty 2

## 2017-02-16 MED ORDER — DEXAMETHASONE 0.5 MG PO TABS
1.5000 mg | ORAL_TABLET | Freq: Every day | ORAL | Status: DC
  Administered 2017-02-16 – 2017-02-18 (×3): 1.5 mg via ORAL
  Filled 2017-02-16 (×5): qty 3

## 2017-02-16 MED ORDER — CARVEDILOL 3.125 MG PO TABS
3.1250 mg | ORAL_TABLET | Freq: Two times a day (BID) | ORAL | Status: DC
  Administered 2017-02-16 – 2017-02-18 (×4): 3.125 mg via ORAL
  Filled 2017-02-16 (×4): qty 1

## 2017-02-16 MED ORDER — ONDANSETRON HCL 4 MG/2ML IJ SOLN: 4.0000 mg | Freq: Four times a day (QID) | INTRAMUSCULAR | Status: DC | PRN

## 2017-02-16 MED ORDER — ALBUTEROL (5 MG/ML) CONTINUOUS INHALATION SOLN
10.0000 mg/h | INHALATION_SOLUTION | Freq: Once | RESPIRATORY_TRACT | Status: AC
  Administered 2017-02-16: 10 mg/h via RESPIRATORY_TRACT
  Filled 2017-02-16: qty 20
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
## 2017-02-16 NOTE — Progress Notes (Signed)
*  PRELIMINARY RESULTS* Echocardiogram 2D Echocardiogram has been performed.  [name] [name] 02/16/2017, 2:58 PM

## 2017-02-16 NOTE — ED Notes (Signed)
Report given to Lattie Haw, RN for room 325

## 2017-02-16 NOTE — ED Triage Notes (Signed)
Pt sent to er from [facility] for further evaluation of wheezing,sob that started yesterday, does have a cough that is productive with gray sputum, denies any pain, pt arrived to er with duo neb in progress,

## 2017-02-16 NOTE — Consult Note (Signed)
[name] in with COPD exacerbation. On appropriate rx full note to follow

## 2017-02-16 NOTE — Progress Notes (Signed)
Subjective: Patient is admitted from [facility] due worsening shortness of breath due to COPD exacerbation.    Objective: Vital signs in last 24 hours: Temp:  [98 F (36.7 C)-98.3 F (36.8 C)] 98.3 F (36.8 C) (03/12 0836) Pulse Rate:  [93-105] 93 (03/12 0836) Resp:  [15-26] 18 (03/12 0836) BP: (120-152)/(49-53) 152/51 (03/12 0836) SpO2:  [99 %-100 %] 100 % (03/12 0836) Weight:  [108.4 kg (238 lb 14.4 oz)-114.8 kg (253 lb)] 108.4 kg (238 lb 14.4 oz) (03/12 0836) Weight change:  Last BM Date: 02/15/17  Intake/Output from previous day: No intake/output data recorded.  PHYSICAL EXAM General appearance: alert and mild distress Resp: diminished breath sounds bilaterally and wheezes bilaterally Cardio: irregularly irregular rhythm GI: soft, non-tender; bowel sounds normal; no masses,  no organomegaly Extremities: extremities normal, atraumatic, no cyanosis or edema  Lab Results:  Results for orders placed or performed during the hospital encounter of 02/16/17 (from the past 48 hour(s))  Basic metabolic panel     Status: Abnormal   Collection Time: 02/16/17  6:17 AM  Result Value Ref Range   Sodium 137 135 - 145 mmol/L   Potassium 4.2 3.5 - 5.1 mmol/L   Chloride 103 101 - 111 mmol/L   CO2 25 22 - 32 mmol/L   Glucose, Bld 136 (H) 65 - 99 mg/dL   BUN 81 (H) 6 - 20 mg/dL   Creatinine, Ser 2.38 (H) 0.61 - 1.24 mg/dL   Calcium 9.3 8.9 - 10.3 mg/dL   GFR calc non Af Amer 26 (L) >60 mL/min   GFR calc Af Amer 30 (L) >60 mL/min    Comment: (NOTE) The eGFR has been calculated using the CKD EPI equation. This calculation has not been validated in all clinical situations. eGFR's persistently <60 mL/min signify possible Chronic Kidney Disease.    Anion gap 9 5 - 15  CBC with Differential/Platelet     Status: Abnormal   Collection Time: 02/16/17  6:17 AM  Result Value Ref Range   WBC 7.2 4.0 - 10.5 K/uL   RBC 3.15 (L) 4.22 - 5.81 MIL/uL   Hemoglobin 9.6 (L) 13.0 - 17.0 g/dL   HCT  29.2 (L) 39.0 - 52.0 %   MCV 92.7 78.0 - 100.0 fL   MCH 30.5 26.0 - 34.0 pg   MCHC 32.9 30.0 - 36.0 g/dL   RDW 14.9 11.5 - 15.5 %   Platelets 136 (L) 150 - 400 K/uL   Neutrophils Relative % 61 %   Neutro Abs 4.4 1.7 - 7.7 K/uL   Lymphocytes Relative 14 %   Lymphs Abs 1.0 0.7 - 4.0 K/uL   Monocytes Relative 8 %   Monocytes Absolute 0.6 0.1 - 1.0 K/uL   Eosinophils Relative 16 %   Eosinophils Absolute 1.2 (H) 0.0 - 0.7 K/uL   Basophils Relative 1 %   Basophils Absolute 0.1 0.0 - 0.1 K/uL  Phenytoin level, total     Status: None   Collection Time: 02/16/17  6:17 AM  Result Value Ref Range   Phenytoin Lvl 12.9 10.0 - 20.0 ug/mL  Brain natriuretic peptide     Status: Abnormal   Collection Time: 02/16/17  6:17 AM  Result Value Ref Range   B Natriuretic Peptide 597.0 (H) 0.0 - 100.0 pg/mL    ABGS No results for input(s): PHART, PO2ART, TCO2, HCO3 in the last 72 hours.  Invalid input(s): PCO2 CULTURES No results found for this or any previous visit (from the past 240 hour(s)). Studies/Results:  Dg Chest Port 1 View  Result Date: 02/16/2017 CLINICAL DATA:  Onset of wheezing and shortness of breath yesterday, productive cough, no chest pain. History of COPD, tobacco abuse, pan hypopituitarism, aortic regurgitation, diabetes EXAM: PORTABLE CHEST 1 VIEW COMPARISON:  Portable chest x-ray of February 11, 2015 FINDINGS: The lungs are well-expanded. There is no focal infiltrate. The cardiac silhouette remains enlarged. The pulmonary vascularity is mildly engorged and there is mild cephalization of the vascular pattern. There is no pleural effusion. The trachea is midline. There is calcification in the wall of the aortic arch. IMPRESSION: Low-grade CHF superimposed upon COPD.  There is no acute pneumonia. Thoracic aortic atherosclerosis. Electronically Signed   By: David  Martinique M.D.   On: 02/16/2017 07:13    Medications: I have reviewed the patient's current medications.  Assesment:  Active  Problems:   Hypothyroid   Seizures (HCC)   Essential hypertension   CKD (chronic kidney disease) stage 4, GFR 15-29 ml/min (HCC)   COPD exacerbation (HCC)   Chronic respiratory failure (HCC)   Acute on chronic diastolic heart failure (HCC)    Plan:  Medications reviewed Continue IV steroid and neb Pulmonary consult Continue regular treatment    LOS: 0 days   Juno Alers 02/16/2017, 12:02 PM
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
## 2017-02-17 DIAGNOSIS — J441 Chronic obstructive pulmonary disease with (acute) exacerbation: Secondary | ICD-10-CM | POA: Diagnosis not present

## 2017-02-17 DIAGNOSIS — I5022 Chronic systolic (congestive) heart failure: Secondary | ICD-10-CM | POA: Diagnosis not present

## 2017-02-17 LAB — BASIC METABOLIC PANEL
ANION GAP: 9 (ref 5–15)
BUN: 75 mg/dL — AB (ref 6–20)
CO2: 22 mmol/L (ref 22–32)
Calcium: 8.9 mg/dL (ref 8.9–10.3)
Chloride: 104 mmol/L (ref 101–111)
Creatinine, Ser: 2.4 mg/dL — ABNORMAL HIGH (ref 0.61–1.24)
GFR calc Af Amer: 29 mL/min — ABNORMAL LOW (ref >60)
GFR, EST NON AFRICAN AMERICAN: 25 mL/min — AB (ref >60)
Glucose, Bld: 107 mg/dL — ABNORMAL HIGH (ref 65–99)
POTASSIUM: 4.8 mmol/L (ref 3.5–5.1)
Sodium: 135 mmol/L (ref 135–145)

## 2017-02-17 MED ORDER — METHYLPREDNISOLONE SODIUM SUCC 125 MG IJ SOLR
60.0000 mg | Freq: Two times a day (BID) | INTRAMUSCULAR | Status: DC
  Administered 2017-02-17 – 2017-02-18 (×2): 60 mg via INTRAVENOUS
  Filled 2017-02-17 (×2): qty 2

## 2017-02-17 NOTE — Progress Notes (Signed)
Subjective: Patient feels better today. His cough, wheezing and shortness of breath is less..    Objective: Vital signs in last 24 hours: Temp:  [98 F (36.7 C)-98.5 F (36.9 C)] 98 F (36.7 C) (03/13 0634) Pulse Rate:  [79-93] 80 (03/13 0634) Resp:  [18-20] 20 (03/13 0634) BP: (128-152)/(37-62) 130/61 (03/13 0634) SpO2:  [95 %-100 %] 95 % (03/13 0634) Weight:  [108.3 kg (238 lb 12.1 oz)-108.4 kg (238 lb 14.4 oz)] 108.3 kg (238 lb 12.1 oz) (03/13 0634) Weight change: -6.396 kg (-14 lb 1.6 oz) Last BM Date: 02/15/17  Intake/Output from previous day: 03/12 0701 - 03/13 0700 In: 483 [P.O.:480; I.V.:3] Out: 800 [Urine:800]  PHYSICAL EXAM General appearance: alert and mild distress Resp: diminished breath sounds bilaterally and wheezes bilaterally Cardio: irregularly irregular rhythm GI: soft, non-tender; bowel sounds normal; no masses,  no organomegaly Extremities: extremities normal, atraumatic, no cyanosis or edema  Lab Results:  Results for orders placed or performed during the hospital encounter of 02/16/17 (from the past 48 hour(s))  Basic metabolic panel     Status: Abnormal   Collection Time: 02/16/17  6:17 AM  Result Value Ref Range   Sodium 137 135 - 145 mmol/L   Potassium 4.2 3.5 - 5.1 mmol/L   Chloride 103 101 - 111 mmol/L   CO2 25 22 - 32 mmol/L   Glucose, Bld 136 (H) 65 - 99 mg/dL   BUN 81 (H) 6 - 20 mg/dL   Creatinine, Ser 2.38 (H) 0.61 - 1.24 mg/dL   Calcium 9.3 8.9 - 10.3 mg/dL   GFR calc non Af Amer 26 (L) >60 mL/min   GFR calc Af Amer 30 (L) >60 mL/min    Comment: (NOTE) The eGFR has been calculated using the CKD EPI equation. This calculation has not been validated in all clinical situations. eGFR's persistently <60 mL/min signify possible Chronic Kidney Disease.    Anion gap 9 5 - 15  CBC with Differential/Platelet     Status: Abnormal   Collection Time: 02/16/17  6:17 AM  Result Value Ref Range   WBC 7.2 4.0 - 10.5 K/uL   RBC 3.15 (L) 4.22 -  5.81 MIL/uL   Hemoglobin 9.6 (L) 13.0 - 17.0 g/dL   HCT 29.2 (L) 39.0 - 52.0 %   MCV 92.7 78.0 - 100.0 fL   MCH 30.5 26.0 - 34.0 pg   MCHC 32.9 30.0 - 36.0 g/dL   RDW 14.9 11.5 - 15.5 %   Platelets 136 (L) 150 - 400 K/uL   Neutrophils Relative % 61 %   Neutro Abs 4.4 1.7 - 7.7 K/uL   Lymphocytes Relative 14 %   Lymphs Abs 1.0 0.7 - 4.0 K/uL   Monocytes Relative 8 %   Monocytes Absolute 0.6 0.1 - 1.0 K/uL   Eosinophils Relative 16 %   Eosinophils Absolute 1.2 (H) 0.0 - 0.7 K/uL   Basophils Relative 1 %   Basophils Absolute 0.1 0.0 - 0.1 K/uL  Phenytoin level, total     Status: None   Collection Time: 02/16/17  6:17 AM  Result Value Ref Range   Phenytoin Lvl 12.9 10.0 - 20.0 ug/mL  Brain natriuretic peptide     Status: Abnormal   Collection Time: 02/16/17  6:17 AM  Result Value Ref Range   B Natriuretic Peptide 597.0 (H) 0.0 - 100.0 pg/mL  Basic metabolic panel     Status: Abnormal   Collection Time: 02/17/17  5:53 AM  Result Value Ref Range  Sodium 135 135 - 145 mmol/L   Potassium 4.8 3.5 - 5.1 mmol/L   Chloride 104 101 - 111 mmol/L   CO2 22 22 - 32 mmol/L   Glucose, Bld 107 (H) 65 - 99 mg/dL   BUN 75 (H) 6 - 20 mg/dL   Creatinine, Ser 2.40 (H) 0.61 - 1.24 mg/dL   Calcium 8.9 8.9 - 10.3 mg/dL   GFR calc non Af Amer 25 (L) >60 mL/min   GFR calc Af Amer 29 (L) >60 mL/min    Comment: (NOTE) The eGFR has been calculated using the CKD EPI equation. This calculation has not been validated in all clinical situations. eGFR's persistently <60 mL/min signify possible Chronic Kidney Disease.    Anion gap 9 5 - 15    ABGS No results for input(s): PHART, PO2ART, TCO2, HCO3 in the last 72 hours.  Invalid input(s): PCO2 CULTURES No results found for this or any previous visit (from the past 240 hour(s)). Studies/Results: Dg Chest Port 1 View  Result Date: 02/16/2017 CLINICAL DATA:  Onset of wheezing and shortness of breath yesterday, productive cough, no chest pain. History  of COPD, tobacco abuse, pan hypopituitarism, aortic regurgitation, diabetes EXAM: PORTABLE CHEST 1 VIEW COMPARISON:  Portable chest x-ray of February 11, 2015 FINDINGS: The lungs are well-expanded. There is no focal infiltrate. The cardiac silhouette remains enlarged. The pulmonary vascularity is mildly engorged and there is mild cephalization of the vascular pattern. There is no pleural effusion. The trachea is midline. There is calcification in the wall of the aortic arch. IMPRESSION: Low-grade CHF superimposed upon COPD.  There is no acute pneumonia. Thoracic aortic atherosclerosis. Electronically Signed   By: David  Martinique M.D.   On: 02/16/2017 07:13    Medications: I have reviewed the patient's current medications.  Assesment:  Active Problems:   Hypothyroid   Seizures (HCC)   Essential hypertension   CKD (chronic kidney disease) stage 4, GFR 15-29 ml/min (HCC)   COPD exacerbation (HCC)   Chronic respiratory failure (HCC)   Acute on chronic diastolic heart failure (HCC)    Plan:  Medications reviewed Continue IV steroid and neb Pulmonary consult appreciated Continue regular treatment    LOS: 0 days   Tama Grosz 02/17/2017, 8:19 AM

## 2017-02-17 NOTE — Consult Note (Signed)
NAME:  [name] [name], [name] [name]                  ACCOUNT NO.:  [id number]  MEDICAL RECORD NO.:  [id number]  LOCATION:  LAB                           FACILITY:  [facility]  PHYSICIAN:  Dashaun Onstott L. Luan Pulling, M.D.DATE OF BIRTH:  13-May-1946  DATE OF CONSULTATION: DATE OF DISCHARGE:  09/25/2016                                CONSULTATION   Consultation requested by Dr. Legrand Rams.  REASON FOR CONSULTATION:  COPD exacerbation.  HISTORY OF PRESENT ILLNESS:  [name] [name] is a 71 year old with a long known history of COPD.  He came to the Emergency Department because of a 1-day history of increasing shortness of breath, wheezing, and nonproductive cough.  He has not had any fever.  He denies any overt symptoms of heart failure, specifically no swelling of his legs.  No PND or orthopnea.  He has not had any nausea, vomiting, or dysuria.  He was sent to the Emergency Department and was found to have wheezing when he was seen. He was felt to have congestive heart failure and COPD when he was seen in the Emergency Department and these were treated.  Consultation is requested for COPD exacerbation.  REVIEW OF SYSTEMS:  Except as mentioned is negative with a 10-point review of systems.  PAST MEDICAL HISTORY:  Positive for multiple medical problems including: 1. COPD with chronic respiratory failure. 2. Sleep apnea, not compliant with CPAP. 3. Aortic insufficiency and he has not felt to be an operative     candidate for that. 4. Chronic systolic heart failure. 5. Chronic kidney disease, stage 4. 6. Diabetes. 7. Hyperlipidemia. 8. Hypertension. 9. Hypothyroidism. 10.Obesity. 11.Panhypopituitarism. 12.Pulmonary hypertension. 13.Seizure disorder.  PAST SURGICAL HISTORY:  He has had a colonoscopy several times. Craniotomy that was done for excision of a craniopharyngioma, multiple EGDs, coronary catheterization, and a right nephrectomy for renal cell cancer.  SOCIAL HISTORY:  [name] an ex cigarette smoker, stopped  about 27 years ago, but he has at least a 30-pack-year smoking history.  He does not use alcohol.  He currently resides in a skilled care facility, doing rehabilitation.  FAMILY HISTORY:  Both his parents died of cancer.  He has a sister and brother who have had cancer.  There is a family history of heart failure as well in his sister.  MEDICATIONS:  I reviewed his prior-to-admission medications as well as the medications that he is on now.  PHYSICAL EXAMINATION:  VITAL SIGNS:  Temperature 98, pulse 105, respirations in the 20s, blood pressure 152/70, and O2 saturation 100%. CONSTITUTIONAL:  [name] awake and alert, in no acute distress.  EYES: His pupils react.  EOMI. EARS, NOSE, MOUTH, and THROAT:  [name] somewhat hard of hearing.  His mucous membranes are moist.  His tongue is large and tends to obstruct his airway.  Hearing is grossly normal. CARDIOVASCULAR:  His heart is regular with normal heart sounds.  He does not have any edema, but has chronic venous stasis changes.  RESPIRATORY: He has bilateral wheezes. GASTROINTESTINAL:  Abdomen is soft with no masses. SKIN:  Except for the chronic venous stasis changes, his skin is clear. MUSCULOSKELETAL:  Normal range of motion. NEUROLOGIC:  No focal abnormalities.  PSYCHIATRIC:  Normal mood and affect.  LABORATORY DATA:  I reviewed his blood testing that shows his BUN is 81, creatinine 2.38.  BNP is 597.  White blood count 7200 and hemoglobin 9.6.  I personally reviewed his chest x-ray, which showed some questionable congestive heart failure and COPD, but nothing acute.  ASSESSMENT:  He said he seems to be having an exacerbation of chronic obstructive pulmonary disease and perhaps congestive heart failure as well.  He seems to be on appropriate treatment.  Continue treatments for now.  [name] on intravenous Lasix, IV steroids, and inhaled bronchodilators.  He has not had productive cough, so I do not think he needs to be on an  antibiotic at this point.  I would plan to continue treatment for now and have him rechecked tomorrow and see how he is doing.  Thanks for allowing me to see him with you.     [name] [name] L. Luan Pulling, M.D.     ELH/MEDQ  D:  02/16/2017  T:  02/17/2017  Job:  [id number]

## 2017-02-17 NOTE — Clinical Social Work Note (Signed)
See recent full assessment. Pt has been at [facility] about 1 month and plans to return to complete rehab. Aware of need for insurance authorization. Per Marianna Fuss at facility, pt is okay to return and no FL2 needed. PT evaluation requested. [facility] will start auth once complete.     Clinical Social Work Assessment  Patient Details  Name: [name] [name] MRN: [id number] Date of Birth: 04/05/1946  Date of referral:  01/23/17               Reason for consult:  Discharge Planning                           Permission sought to share information with:    Permission granted to share information::                Name::                   Agency::                Relationship::                Contact Information:     Housing/Transportation Living arrangements for the past 2 months:  Single Family Home Source of Information:  Patient, Facility Patient Interpreter Needed:  None Criminal Activity/Legal Involvement Pertinent to Current Situation/Hospitalization:  No - Comment as needed Significant Relationships:  Adult Children Lives with:  Self Do you feel safe going back to the place where you live?  Yes Need for family participation in patient care:  Yes (Comment)  Care giving concerns:  None identified.    Social Worker assessment / plan: LCSW spoke with Keri at [facility]. Patient came to [facility] on Wednesday as a result of being released from hospital in [place]. Patient has had rehab at [facility] twice in the past.  Patient will not need a new FL2 to return to the facility. Patient ambulates with a walker and requires some assistance with ADLs.  Patient has a supportive family. Patient confirmed statements. He plans on returning to [facility] for rehab purposes at discharge.    Employment status:  Retired Nurse, adult PT Recommendations:  Not assessed at this time Information / Referral to community resources:     Patient/Family's Response to care:  Patient is agreeable to return to [facility]  to complete rehab at discharge.    Patient/Family's Understanding of and Emotional Response to Diagnosis, Current Treatment, and Prognosis:  Patient understands his diagnosis, treatmetn and prognosis.   Emotional Assessment Appearance:  Appears stated age Attitude/Demeanor/Rapport:   (Cooperative/pleasant) Affect (typically observed):  Accepting Orientation:  Oriented to Self, Oriented to Place, Oriented to  Time, Oriented to Situation Alcohol / Substance use:  Not Applicable Psych involvement (Current and /or in the community):     Discharge Needs  Concerns to be addressed:  Discharge Planning Concerns Readmission within the last 30 days:  Yes Current discharge risk:  Chronically ill Barriers to Discharge:  No Barriers Identified   Ihor Gully, LCSW 01/23/2017, 11:07 AM

## 2017-02-17 NOTE — Evaluation (Signed)
Physical Therapy Evaluation Patient Details Name: [name] [name] MRN: [id number] DOB: 09-05-1946 Today's Date: 02/17/2017   History of Present Illness  71 y.o. male with medical history significant of . COPD, chronic respiratory failure on 2 L oxygen, CHF, is currently residing at a skilled nursing facility. Patient reports that since yesterday, except progressive shortness of breath, wheezing, nonproductive cough. Has not had any chest pain, fever. Does not feel that he had any worsening edema. No nausea, vomiting. He did have diarrhea for the last several days, but this resolved yesterday. No dysuria. On arrival to the emergency room, he was noted to be significantly wheezing and tachypnea. He received nebulizer treatments with improvement. Chest x-ray indicates COPD as well as possible acute component of CHF. He seemed better after receiving a nebulizer treatment. He still had persistent wheezing and therefore was referred for admission.  Dx: COPD exacerbation    Clinical Impression  Pt received in bed, and was agreeable to PT evaluation.  Pt states that she has been working with PT at the [facility], but he was not able to ambulate very far.  Pt states that that he was only able to ambulate ~18ft, and mostly mobilizes with the w/c.  During today's PT evaluation he was able to ambulate 116ft with RW and Modified independent.  However, he lives alone, has had multiple falls in the past 6 months, as well as multiple hospitalizations.  [name] recommended to return to the [facility] to continue to progress towards his PLOF.     Follow Up Recommendations SNF    Equipment Recommendations  None recommended by PT    Recommendations for Other Services       Precautions / Restrictions Precautions Precautions: Fall Precaution Comments: 1-2 falls in the past 6 months. The last fall happened after he stood up.       Mobility  Bed Mobility Overal bed mobility: Needs Assistance Bed Mobility:  Supine to Sit     Supine to sit: Modified independent (Device/Increase time);HOB elevated        Transfers Overall transfer level: Needs assistance Equipment used: Rolling walker (2 wheeled) Transfers: Sit to/from Stand Sit to Stand: Modified independent (Device/Increase time)            Ambulation/Gait Ambulation/Gait assistance: Modified independent (Device/Increase time) Ambulation Distance (Feet): 100 Feet Assistive device: Rolling walker (2 wheeled) Gait Pattern/deviations: Step-to pattern;Decreased step length - left;Decreased stance time - right;Wide base of support   Gait velocity interpretation: <1.8 ft/sec, indicative of risk for recurrent falls    Stairs            Wheelchair Mobility    Modified Rankin (Stroke Patients Only)       Balance Overall balance assessment: History of Falls;Needs assistance Sitting-balance support: No upper extremity supported;Feet supported Sitting balance-Leahy Scale: Good     Standing balance support: Bilateral upper extremity supported Standing balance-Leahy Scale: Fair                               Pertinent Vitals/Pain Pain Assessment: No/denies pain    Home Living   Living Arrangements: Alone Available Help at Discharge: Family (Pt states that his sister comes to help with running errands and grocery shopping. ) Type of Home: House Home Access: Stairs to enter   [employer] of Steps: 2 in the front and 3-4 in the back.  Home Layout: One level Home Equipment: Gilford Rile -  2 wheels;Cane - single point (O2 some of the time. ) Additional Comments: Has been at the [facility] since his admission at the beginning of February 2018    Prior Function     Gait / Transfers Assistance Needed: Pt states he has been mostly moving around with a w/c at the [facility], and he uses a RW for short distance ambulation.  Pt states he was only able to ambulate up to ~68ft with RW  ADL's /  Homemaking Assistance Needed: Pt states he was independent with dressing, as well as bathing, however, suspect that the aides were providing at least supervision for bathing.    Comments: Pt states that he sleeps in a recliner at home, and he is normally independent with cooking, but his sister and dtr assist with housework occasionally.      Hand Dominance   Dominant Hand: Right    Extremity/Trunk Assessment   Upper Extremity Assessment Upper Extremity Assessment: Overall WFL for tasks assessed    Lower Extremity Assessment Lower Extremity Assessment: Overall WFL for tasks assessed       Communication   Communication: No difficulties  Cognition Arousal/Alertness: Awake/alert Behavior During Therapy: WFL for tasks assessed/performed Overall Cognitive Status: Within Functional Limits for tasks assessed                      General Comments      Exercises     Assessment/Plan    PT Assessment Patient needs continued PT services  PT Problem List Decreased strength;Decreased activity tolerance;Decreased balance;Decreased mobility;Decreased range of motion;Obesity;Pain       PT Treatment Interventions DME instruction;Gait training;Functional mobility training;Therapeutic activities;Therapeutic exercise;Patient/family education;Stair training;Balance training    PT Goals (Current goals can be found in the Care Plan section)  Acute Rehab PT Goals Patient Stated Goal: Pt wants to get stronger to be able to go back home.  PT Goal Formulation: With patient Time For Goal Achievement: 02/24/17 Potential to Achieve Goals: Good    Frequency Min 3X/week   Barriers to discharge Decreased caregiver support Pt lives alone    Co-evaluation               End of Session Equipment Utilized During Treatment: Gait belt Activity Tolerance: Patient tolerated treatment well Patient left: in chair;with call bell/phone within reach Nurse Communication: Mobility status  Lattie Haw, RN aware of pt's mobility status, and mobility sheet left hanging in the room.  ) PT Visit Diagnosis: History of falling (Z91.81);Difficulty in walking, not elsewhere classified (R26.2)    Functional Assessment Tool Used: AM-PAC 6 Clicks Basic Mobility;Clinical judgement Functional Limitation: Mobility: Walking and moving around Mobility: Walking and Moving Around Current Status (Z6109): At least 20 percent but less than 40 percent impaired, limited or restricted Mobility: Walking and Moving Around Goal Status [phone]): At least 1 percent but less than 20 percent impaired, limited or restricted    Time: 0930-0959 PT Time Calculation (min) (ACUTE ONLY): 29 min   Charges:   PT Evaluation $PT Eval Low Complexity: 1 Procedure PT Treatments $Gait Training: 8-22 mins   PT G Codes:   PT G-Codes **NOT FOR INPATIENT CLASS** Functional Assessment Tool Used: AM-PAC 6 Clicks Basic Mobility;Clinical judgement Functional Limitation: Mobility: Walking and moving around Mobility: Walking and Moving Around Current Status (U9811): At least 20 percent but less than 40 percent impaired, limited or restricted Mobility: Walking and Moving Around Goal Status [phone]): At least 1 percent but less than 20 percent impaired,  limited or restricted     Tacy Learn, PT, DPT X: [phone]

## 2017-02-17 NOTE — Care Management Obs Status (Signed)
[place] NOTIFICATION   Patient Details  Name: [name] [name] MRN: [id number] Date of Birth: 1946/04/03   Medicare Observation Status Notification Given:  Yes    Sherald Barge, RN 02/17/2017, 2:10 PM

## 2017-02-17 NOTE — Progress Notes (Signed)
Subjective: He says he feels much better. No new complaints. His breathing is doing well. No chest pain nausea vomiting diarrhea or any other new complaints  Objective: Vital signs in last 24 hours: Temp:  [98 F (36.7 C)-98.5 F (36.9 C)] 98 F (36.7 C) (03/13 0634) Pulse Rate:  [79-91] 80 (03/13 0634) Resp:  [18-20] 20 (03/13 0634) BP: (128-140)/(37-62) 130/61 (03/13 0634) SpO2:  [95 %-98 %] 95 % (03/13 0634) Weight:  [108.3 kg (238 lb 12.1 oz)] 108.3 kg (238 lb 12.1 oz) (03/13 0634) Weight change: -6.396 kg (-14 lb 1.6 oz) Last BM Date: 02/15/17  Intake/Output from previous day: 03/12 0701 - 03/13 0700 In: 483 [P.O.:480; I.V.:3] Out: 800 [Urine:800]  PHYSICAL EXAM General appearance: alert, cooperative and no distress Resp: clear to auscultation bilaterally Cardio: regular rate and rhythm, S1, S2 normal, no murmur, click, rub or gallop GI: soft, non-tender; bowel sounds normal; no masses,  no organomegaly Extremities: venous stasis dermatitis noted Skin warm and dry. Mucous membranes are moist  Lab Results:  Results for orders placed or performed during the hospital encounter of 02/16/17 (from the past 48 hour(s))  Basic metabolic panel     Status: Abnormal   Collection Time: 02/16/17  6:17 AM  Result Value Ref Range   Sodium 137 135 - 145 mmol/L   Potassium 4.2 3.5 - 5.1 mmol/L   Chloride 103 101 - 111 mmol/L   CO2 25 22 - 32 mmol/L   Glucose, Bld 136 (H) 65 - 99 mg/dL   BUN 81 (H) 6 - 20 mg/dL   Creatinine, Ser 2.38 (H) 0.61 - 1.24 mg/dL   Calcium 9.3 8.9 - 10.3 mg/dL   GFR calc non Af Amer 26 (L) >60 mL/min   GFR calc Af Amer 30 (L) >60 mL/min    Comment: (NOTE) The eGFR has been calculated using the CKD EPI equation. This calculation has not been validated in all clinical situations. eGFR's persistently <60 mL/min signify possible Chronic Kidney Disease.    Anion gap 9 5 - 15  CBC with Differential/Platelet     Status: Abnormal   Collection Time: 02/16/17   6:17 AM  Result Value Ref Range   WBC 7.2 4.0 - 10.5 K/uL   RBC 3.15 (L) 4.22 - 5.81 MIL/uL   Hemoglobin 9.6 (L) 13.0 - 17.0 g/dL   HCT 29.2 (L) 39.0 - 52.0 %   MCV 92.7 78.0 - 100.0 fL   MCH 30.5 26.0 - 34.0 pg   MCHC 32.9 30.0 - 36.0 g/dL   RDW 14.9 11.5 - 15.5 %   Platelets 136 (L) 150 - 400 K/uL   Neutrophils Relative % 61 %   Neutro Abs 4.4 1.7 - 7.7 K/uL   Lymphocytes Relative 14 %   Lymphs Abs 1.0 0.7 - 4.0 K/uL   Monocytes Relative 8 %   Monocytes Absolute 0.6 0.1 - 1.0 K/uL   Eosinophils Relative 16 %   Eosinophils Absolute 1.2 (H) 0.0 - 0.7 K/uL   Basophils Relative 1 %   Basophils Absolute 0.1 0.0 - 0.1 K/uL  Phenytoin level, total     Status: None   Collection Time: 02/16/17  6:17 AM  Result Value Ref Range   Phenytoin Lvl 12.9 10.0 - 20.0 ug/mL  Brain natriuretic peptide     Status: Abnormal   Collection Time: 02/16/17  6:17 AM  Result Value Ref Range   B Natriuretic Peptide 597.0 (H) 0.0 - 100.0 pg/mL  Basic metabolic panel  Status: Abnormal   Collection Time: 02/17/17  5:53 AM  Result Value Ref Range   Sodium 135 135 - 145 mmol/L   Potassium 4.8 3.5 - 5.1 mmol/L   Chloride 104 101 - 111 mmol/L   CO2 22 22 - 32 mmol/L   Glucose, Bld 107 (H) 65 - 99 mg/dL   BUN 75 (H) 6 - 20 mg/dL   Creatinine, Ser 2.40 (H) 0.61 - 1.24 mg/dL   Calcium 8.9 8.9 - 10.3 mg/dL   GFR calc non Af Amer 25 (L) >60 mL/min   GFR calc Af Amer 29 (L) >60 mL/min    Comment: (NOTE) The eGFR has been calculated using the CKD EPI equation. This calculation has not been validated in all clinical situations. eGFR's persistently <60 mL/min signify possible Chronic Kidney Disease.    Anion gap 9 5 - 15    ABGS No results for input(s): PHART, PO2ART, TCO2, HCO3 in the last 72 hours.  Invalid input(s): PCO2 CULTURES No results found for this or any previous visit (from the past 240 hour(s)). Studies/Results: Dg Chest Port 1 View  Result Date: 02/16/2017 CLINICAL DATA:  Onset of  wheezing and shortness of breath yesterday, productive cough, no chest pain. History of COPD, tobacco abuse, pan hypopituitarism, aortic regurgitation, diabetes EXAM: PORTABLE CHEST 1 VIEW COMPARISON:  Portable chest x-ray of February 11, 2015 FINDINGS: The lungs are well-expanded. There is no focal infiltrate. The cardiac silhouette remains enlarged. The pulmonary vascularity is mildly engorged and there is mild cephalization of the vascular pattern. There is no pleural effusion. The trachea is midline. There is calcification in the wall of the aortic arch. IMPRESSION: Low-grade CHF superimposed upon COPD.  There is no acute pneumonia. Thoracic aortic atherosclerosis. Electronically Signed   By: David  Martinique M.D.   On: 02/16/2017 07:13    Medications:  Prior to Admission:  Prescriptions Prior to Admission  Medication Sig Dispense Refill Last Dose  . acetaminophen (TYLENOL) 325 MG tablet Take 650 mg by mouth every 6 (six) hours as needed.   unknown  . amLODipine (NORVASC) 10 MG tablet Take 1 tablet (10 mg total) by mouth daily. 30 tablet 2 02/15/2017 at Unknown time  . calcitRIOL (ROCALTROL) 0.5 MCG capsule Take 0.5 mcg by mouth daily.   02/15/2017 at Unknown time  . Cholecalciferol (VITAMIN D3) 2000 units capsule Take 2,000 Units by mouth daily.   02/15/2017 at Unknown time  . desmopressin (DDAVP) 0.2 MG tablet Take 1 tablet (0.2 mg total) by mouth 2 (two) times daily. 60 tablet 12 02/15/2017 at Unknown time  . dexamethasone (DECADRON) 1.5 MG tablet Take 1.5 mg by mouth daily.   Past Week at Unknown time  . feeding supplement, GLUCERNA SHAKE, (GLUCERNA SHAKE) LIQD Take 237 mLs by mouth daily.   02/15/2017 at Unknown time  . HYDROcodone-acetaminophen (NORCO) 10-325 MG tablet Take 1 tablet by mouth every 4 (four) hours as needed for moderate pain.   02/15/2017 at Unknown time  . ipratropium-albuterol (DUONEB) 0.5-2.5 (3) MG/3ML SOLN Take 3 mLs by nebulization every 4 (four) hours as needed.   02/13/2017 at  Unknown time  . isosorbide-hydrALAZINE (BIDIL) 20-37.5 MG tablet Take 1 tablet by mouth 3 (three) times daily. 90 tablet 3 02/15/2017 at Unknown time  . levothyroxine (SYNTHROID, LEVOTHROID) 150 MCG tablet Take 1 tablet (150 mcg total) by mouth daily before breakfast. 30 tablet 12 Past Week at Unknown time  . phenytoin (DILANTIN) 100 MG ER capsule Take 100 mg by mouth 3 (  three) times daily.   02/15/2017 at Unknown time  . OXYGEN 02 @@2  L pm via nasal cannula every shift   02/10/2017 at Unknown time   Scheduled: . amLODipine  10 mg Oral Daily  . calcitRIOL  0.5 mcg Oral Daily  . carvedilol  3.125 mg Oral BID WC  . desmopressin  0.2 mg Oral BID  . dexamethasone  1.5 mg Oral Daily  . furosemide  40 mg Intravenous Daily  . guaiFENesin  1,200 mg Oral BID  . heparin  5,000 Units Subcutaneous Q8H  . ipratropium-albuterol  3 mL Nebulization Q6H  . isosorbide-hydrALAZINE  1 tablet Oral TID  . levofloxacin  750 mg Oral Q48H  . levothyroxine  150 mcg Oral QAC breakfast  . loratadine  10 mg Oral Daily  . methylPREDNISolone (SOLU-MEDROL) injection  60 mg Intravenous Q12H  . phenytoin  100 mg Oral TID  . sodium chloride flush  3 mL Intravenous Q12H   Continuous:  FMB:[id number] chloride, acetaminophen, HYDROcodone-acetaminophen, ondansetron (ZOFRAN) IV, sodium chloride flush  Assesment: He was admitted with COPD exacerbation and acute on chronic respiratory failure. He also had acute on chronic diastolic heart failure. [name] substantially improved this morning. Active Problems:   Hypothyroid   Seizures (HCC)   Essential hypertension   CKD (chronic kidney disease) stage 4, GFR 15-29 ml/min (HCC)   COPD exacerbation (HCC)   Chronic respiratory failure (HCC)   Acute on chronic diastolic heart failure ([place])    Plan: Continue treatments. He says he thinks he is going to be discharged tomorrow    LOS: 0 days   Lief Palmatier L 02/17/2017, 9:13 AM

## 2017-02-18 ENCOUNTER — Inpatient Hospital Stay
Admission: RE | Admit: 2017-02-18 | Discharge: 2017-03-09 | Disposition: A | Discharge status: Home / Self Care | Payer: Medicare HMO | Source: Ambulatory Visit | Attending: Internal Medicine | Admitting: Internal Medicine

## 2017-02-18 ENCOUNTER — Encounter (HOSPITAL_COMMUNITY)
Admission: RE | Admit: 2017-02-18 | Discharge: 2017-02-18 | Disposition: A | Discharge status: Home / Self Care | Payer: Medicare HMO | Source: Skilled Nursing Facility | Attending: Internal Medicine | Admitting: Internal Medicine

## 2017-02-18 DIAGNOSIS — J9610 Chronic respiratory failure, unspecified whether with hypoxia or hypercapnia: Secondary | ICD-10-CM | POA: Insufficient documentation

## 2017-02-18 DIAGNOSIS — L03116 Cellulitis of left lower limb: Secondary | ICD-10-CM | POA: Diagnosis not present

## 2017-02-18 DIAGNOSIS — R279 Unspecified lack of coordination: Secondary | ICD-10-CM | POA: Insufficient documentation

## 2017-02-18 DIAGNOSIS — E232 Diabetes insipidus: Secondary | ICD-10-CM | POA: Diagnosis not present

## 2017-02-18 DIAGNOSIS — G40909 Epilepsy, unspecified, not intractable, without status epilepticus: Secondary | ICD-10-CM | POA: Diagnosis not present

## 2017-02-18 DIAGNOSIS — J449 Chronic obstructive pulmonary disease, unspecified: Secondary | ICD-10-CM | POA: Diagnosis not present

## 2017-02-18 DIAGNOSIS — Z905 Acquired absence of kidney: Secondary | ICD-10-CM | POA: Insufficient documentation

## 2017-02-18 DIAGNOSIS — Z7984 Long term (current) use of oral hypoglycemic drugs: Secondary | ICD-10-CM | POA: Diagnosis not present

## 2017-02-18 DIAGNOSIS — Z87891 Personal history of nicotine dependence: Secondary | ICD-10-CM | POA: Diagnosis not present

## 2017-02-18 DIAGNOSIS — J441 Chronic obstructive pulmonary disease with (acute) exacerbation: Secondary | ICD-10-CM | POA: Diagnosis not present

## 2017-02-18 DIAGNOSIS — I130 Hypertensive heart and chronic kidney disease with heart failure and stage 1 through stage 4 chronic kidney disease, or unspecified chronic kidney disease: Secondary | ICD-10-CM | POA: Diagnosis not present

## 2017-02-18 DIAGNOSIS — E039 Hypothyroidism, unspecified: Secondary | ICD-10-CM | POA: Diagnosis not present

## 2017-02-18 DIAGNOSIS — I5042 Chronic combined systolic (congestive) and diastolic (congestive) heart failure: Secondary | ICD-10-CM | POA: Diagnosis not present

## 2017-02-18 DIAGNOSIS — I77810 Thoracic aortic ectasia: Secondary | ICD-10-CM | POA: Diagnosis not present

## 2017-02-18 DIAGNOSIS — E1122 Type 2 diabetes mellitus with diabetic chronic kidney disease: Secondary | ICD-10-CM | POA: Diagnosis not present

## 2017-02-18 DIAGNOSIS — R569 Unspecified convulsions: Secondary | ICD-10-CM | POA: Diagnosis not present

## 2017-02-18 DIAGNOSIS — N184 Chronic kidney disease, stage 4 (severe): Secondary | ICD-10-CM | POA: Diagnosis not present

## 2017-02-18 DIAGNOSIS — E033 Postinfectious hypothyroidism: Secondary | ICD-10-CM | POA: Diagnosis not present

## 2017-02-18 DIAGNOSIS — E119 Type 2 diabetes mellitus without complications: Secondary | ICD-10-CM | POA: Diagnosis not present

## 2017-02-18 DIAGNOSIS — I5033 Acute on chronic diastolic (congestive) heart failure: Secondary | ICD-10-CM | POA: Diagnosis not present

## 2017-02-18 DIAGNOSIS — G4730 Sleep apnea, unspecified: Secondary | ICD-10-CM | POA: Diagnosis not present

## 2017-02-18 DIAGNOSIS — I5022 Chronic systolic (congestive) heart failure: Secondary | ICD-10-CM | POA: Diagnosis not present

## 2017-02-18 DIAGNOSIS — I5021 Acute systolic (congestive) heart failure: Secondary | ICD-10-CM | POA: Diagnosis not present

## 2017-02-18 DIAGNOSIS — I10 Essential (primary) hypertension: Secondary | ICD-10-CM | POA: Diagnosis not present

## 2017-02-18 DIAGNOSIS — Z79899 Other long term (current) drug therapy: Secondary | ICD-10-CM | POA: Diagnosis not present

## 2017-02-18 DIAGNOSIS — D509 Iron deficiency anemia, unspecified: Secondary | ICD-10-CM | POA: Diagnosis not present

## 2017-02-18 DIAGNOSIS — J42 Unspecified chronic bronchitis: Secondary | ICD-10-CM | POA: Diagnosis not present

## 2017-02-18 DIAGNOSIS — H578 Other specified disorders of eye and adnexa: Secondary | ICD-10-CM | POA: Diagnosis not present

## 2017-02-18 LAB — RENAL FUNCTION PANEL
ALBUMIN: 3.1 g/dL — AB (ref 3.5–5.0)
Anion gap: 8 (ref 5–15)
BUN: 78 mg/dL — ABNORMAL HIGH (ref 6–20)
CALCIUM: 8.8 mg/dL — AB (ref 8.9–10.3)
CO2: 21 mmol/L — ABNORMAL LOW (ref 22–32)
Chloride: 106 mmol/L (ref 101–111)
Creatinine, Ser: 2.37 mg/dL — ABNORMAL HIGH (ref 0.61–1.24)
GFR calc non Af Amer: 26 mL/min — ABNORMAL LOW (ref >60)
GFR, EST AFRICAN AMERICAN: 30 mL/min — AB (ref >60)
Glucose, Bld: 82 mg/dL (ref 65–99)
PHOSPHORUS: 3.3 mg/dL (ref 2.5–4.6)
Potassium: 4.5 mmol/L (ref 3.5–5.1)
Sodium: 135 mmol/L (ref 135–145)

## 2017-02-18 LAB — CBC
HCT: 26.7 % — ABNORMAL LOW (ref 39.0–52.0)
HEMOGLOBIN: 8.5 g/dL — AB (ref 13.0–17.0)
MCH: 29.8 pg (ref 26.0–34.0)
MCHC: 31.8 g/dL (ref 30.0–36.0)
MCV: 93.7 fL (ref 78.0–100.0)
Platelets: 144 10*3/uL — ABNORMAL LOW (ref 150–400)
RBC: 2.85 MIL/uL — AB (ref 4.22–5.81)
RDW: 14.5 % (ref 11.5–15.5)
WBC: 6.6 10*3/uL (ref 4.0–10.5)

## 2017-02-18 MED ORDER — HYDROCODONE-ACETAMINOPHEN 10-325 MG PO TABS: 1.0000 | ORAL_TABLET | ORAL | 0 refills | Status: DC | PRN

## 2017-02-18 MED ORDER — LEVOFLOXACIN 750 MG PO TABS: 750.0000 mg | ORAL_TABLET | ORAL | 0 refills | Status: DC

## 2017-02-18 MED ORDER — PREDNISONE 10 MG PO TABS: ORAL_TABLET | ORAL | 0 refills | Status: DC

## 2017-02-18 NOTE — Clinical Social Work Note (Signed)
Pt d/c today back to [facility]. Pt and facility aware and agreeable. Pt states he has already notified his family and no need for CSW. [employer] authorization received per [facility]. Pt will transfer with staff.  Benay Pike, [place]

## 2017-02-18 NOTE — Discharge Summary (Signed)
Physician Discharge Summary  Patient ID: [name] [name] MRN: [id number] DOB/AGE: [birth year] 71 y.o. Primary Care Physician:Kirk Sampley, MD Admit date: 02/16/2017 Discharge date: 02/18/2017    Discharge Diagnoses:   Active Problems:   Hypothyroid   Seizures (HCC)   Essential hypertension   CKD (chronic kidney disease) stage 4, GFR 15-29 ml/min (HCC)   COPD exacerbation (HCC)   Chronic respiratory failure (HCC)   Acute on chronic diastolic heart failure (HCC)   Allergies as of 02/18/2017   No Known Allergies     Medication List    TAKE these medications   acetaminophen 325 MG tablet Commonly known as:  TYLENOL Take 650 mg by mouth every 6 (six) hours as needed.   amLODipine 10 MG tablet Commonly known as:  NORVASC Take 1 tablet (10 mg total) by mouth daily.   calcitRIOL 0.5 MCG capsule Commonly known as:  ROCALTROL Take 0.5 mcg by mouth daily.   desmopressin 0.2 MG tablet Commonly known as:  DDAVP Take 1 tablet (0.2 mg total) by mouth 2 (two) times daily.   dexamethasone 1.5 MG tablet Commonly known as:  DECADRON Take 1.5 mg by mouth daily.   feeding supplement (GLUCERNA SHAKE) Liqd Take 237 mLs by mouth daily.   HYDROcodone-acetaminophen 10-325 MG tablet Commonly known as:  NORCO Take 1 tablet by mouth every 4 (four) hours as needed for moderate pain.   ipratropium-albuterol 0.5-2.5 (3) MG/3ML Soln Commonly known as:  DUONEB Take 3 mLs by nebulization every 4 (four) hours as needed.   isosorbide-hydrALAZINE 20-37.5 MG tablet Commonly known as:  BIDIL Take 1 tablet by mouth 3 (three) times daily.   levofloxacin 750 MG tablet Commonly known as:  LEVAQUIN Take 1 tablet (750 mg total) by mouth every other day.   levothyroxine 150 MCG tablet Commonly known as:  SYNTHROID, LEVOTHROID Take 1 tablet (150 mcg total) by mouth daily before breakfast.   OXYGEN 02 @@2  L pm via nasal cannula every shift   phenytoin 100 MG ER capsule Commonly known as:   DILANTIN Take 100 mg by mouth 3 (three) times daily.   predniSONE 10 MG tablet Commonly known as:  DELTASONE 4 tab po daily for 3 days, 3 tab po daily for 3 days, 2 tab po daily for 3 days, 1 tab po daily for 3 days.   Vitamin D3 2000 units capsule Take 2,000 Units by mouth daily.       Discharged Condition: improved    Consults: pulmonary  Significant Diagnostic Studies: Ct Abdomen Pelvis Wo Contrast  Result Date: 01/23/2017 CLINICAL DATA:  Epigastric pain for 1 day, shortness of breath, history hypertension, type II diabetes mellitus, COPD, chronic systolic CHF, stage IV chronic kidney disease, former smoker, panhypopituitarism EXAM: CT ABDOMEN AND PELVIS WITHOUT CONTRAST TECHNIQUE: Multidetector CT imaging of the abdomen and pelvis was performed following the standard protocol without IV contrast. Sagittal and coronal MPR images reconstructed from axial data set. COMPARISON:  07/20/2016 FINDINGS: Lower chest: Bibasilar atelectasis. RIGHT lower lobe bilobed nodule 12 x 9 mm, previously 11 x 8 mm on 07/20/2016 and 11 x 9 mm on 12/06/2014. Hepatobiliary: Gallbladder and liver normal appearance Pancreas: Normal appearance Spleen: Normal appearance Adrenals/Urinary Tract: Adrenal glands normal appearance. Post RIGHT nephrectomy. Multiple LEFT renal nodules, some of which are intermediate to high attenuation including a 2.4 x 2.0 cm anterior LEFT renal nodule image 39 previously 2.2 x 2.0 cm. Low-attenuation cyst medial LEFT kidney 2.6 x 2.2 cm image 42 previously 2.3 x 2.1 cm. No  LEFT hydronephrosis or definite calculi. Remaining visualized LEFT renal nodules appear little changed in sizes. Unremarkable LEFT ureter and urinary bladder. Stomach/Bowel: Normal appendix. Fluid fills sigmoid colon and rectum. Stomach and bowel loops otherwise normal appearance. Vascular/Lymphatic: Atherosclerotic calcifications aorta, iliac arteries and coronary arteries. Curvilinear calcification within the  mesenteries image 49 11 x 10 mm question calcified mesenteric artery aneurysm, less likely calcified lymph node or mass, unchanged. No adenopathy. Enlarged heart. 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 Reproductive: Unremarkable prostate gland and seminal vesicles Other: No free air or free fluid. Small LEFT inguinal and umbilical hernias containing fat. Musculoskeletal: Degenerative disc and facet disease changes at lower lumbar spine. 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 Distended fluid-filled rectosigmoid colon question history of diarrhea. Small LEFT inguinal and umbilical hernias containing fat. Post RIGHT nephrectomy with multiple LEFT renal nodules which appear represent a combination of cysts and potentially hemorrhagic/high attenuation cysts ; while the majority these lesions are little changed from prior exam, consider either follow-up CT to demonstrate stability or characterization by ultrasound or MR imaging to exclude solid mass. Question small calcified mesenteric artery aneurysm 11 x 10 mm diameter. Stable RIGHT lower lobe nodule. Electronically Signed   By: Lavonia Dana M.D.   On: 01/23/2017 12:56   Ct Head Wo Contrast  Result Date: 02/10/2017 CLINICAL DATA:  Pain in the extremity, decreased sensation to left hand and forearm per epic notes EXAM: CT HEAD WITHOUT CONTRAST TECHNIQUE: Contiguous axial images were obtained from the base of the skull through the vertex without intravenous contrast. COMPARISON:  08/03/2016, 12/08/2014, 05/20/2010, MRI 05/27/2010 FINDINGS: Brain: No acute territorial infarction or hemorrhage visualized. Large area of encephalomalacia involving the left frontal lobe, unchanged. Cortical atrophy. Stable slightly enlarged ventricles. Lobulated soft tissue density within the sella measuring 2 x 2.3 x 2.8  cm, has shown slow enlargement compare to more remote exams. Mass probably involves the right greater than left cavernous sinus. Vascular: No hyperdense vessels.  Carotid artery calcifications. Skull: No fracture. Prior craniotomy changes of the frontal bone. No suspicious lesion. Sinuses/Orbits: Mild mucosal thickening in the ethmoid and sphenoid sinuses. No acute orbital abnormality. Other: None IMPRESSION: 1. No definite CT evidence for acute intracranial abnormality 2. 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 Mass probably involves the right cavernous sinus. It appears more prominent today compared to more remote imaging of the brain. Follow-up MRI is suggested. 3. Old left frontal lobe encephalomalacia with overlying postsurgical changes of the calvarium. Electronically Signed   By: Donavan Foil M.D.   On: 02/10/2017 15:27   Dg Chest Port 1 View  Result Date: 02/16/2017 CLINICAL DATA:  Onset of wheezing and shortness of breath yesterday, productive cough, no chest pain. History of COPD, tobacco abuse, pan hypopituitarism, aortic regurgitation, diabetes EXAM: PORTABLE CHEST 1 VIEW COMPARISON:  Portable chest x-ray of February 11, 2015 FINDINGS: The lungs are well-expanded. There is no focal infiltrate. The cardiac silhouette remains enlarged. The pulmonary vascularity is mildly engorged and there is mild cephalization of the vascular pattern. There is no pleural effusion. The trachea is midline. There is calcification in the wall of the aortic arch. IMPRESSION: Low-grade CHF superimposed upon COPD.  There is no acute pneumonia. Thoracic aortic atherosclerosis. Electronically Signed   By: David  Martinique M.D.   On: 02/16/2017 07:13   Dg Chest Port 1  View  Result Date: 02/10/2017 CLINICAL DATA:  71 y/o M; chest pain and left upper extremity pain. History of COPD, congestive heart failure, diabetes, and chronic kidney disease. EXAM: PORTABLE CHEST 1 VIEW COMPARISON:  01/26/2017  chest radiograph. FINDINGS: Stable severe cardiomegaly. Prominent interstitial markings. No focal consolidation. No pleural effusion or pneumothorax is identified. Bones are unremarkable. IMPRESSION: Stable severe cardiomegaly. Chronic interstitial markings probably representing mild interstitial pulmonary edema. Electronically Signed   By: Kristine Garbe M.D.   On: 02/10/2017 14:17   Dg Chest Port 1 View  Result Date: 01/26/2017 CLINICAL DATA:  Shortness of Breath.  Hypertension. EXAM: PORTABLE CHEST 1 VIEW COMPARISON:  January 22, 2017 FINDINGS: No edema or consolidation. Stable cardiomegaly. Pulmonary vascularity is normal. Aorta is mildly prominent but stable. No adenopathy. No bone lesions. IMPRESSION: Cardiomegaly. Prominent aorta, likely due to chronic hypertension. No edema or consolidation. Electronically Signed   By: Lowella Grip III M.D.   On: 01/26/2017 09:24   Dg Chest Port 1 View  Result Date: 01/22/2017 CLINICAL DATA:  Shortness of breath, wheezing and fever. EXAM: PORTABLE CHEST 1 VIEW COMPARISON:  12/17/2016 and 12/15/2016 FINDINGS: Stable enlargement of the cardiac silhouette. Prominent right central vascular structures appear chronic. Lungs are clear without pulmonary edema or focal airspace disease. Chronic mild elevation of the right hemidiaphragm. IMPRESSION: Stable cardiomegaly. No acute chest findings. Electronically Signed   By: Markus Daft M.D.   On: 01/22/2017 12:38    Lab Results: Basic Metabolic Panel:  Recent Labs  02/17/17 0553 02/18/17 0437  NA 135 135  K 4.8 4.5  CL 104 106  CO2 22 21*  GLUCOSE 107* 82  BUN 75* 78*  CREATININE 2.40* 2.37*  CALCIUM 8.9 8.8*  PHOS  --  3.3   Liver Function Tests:  Recent Labs  02/18/17 0437  ALBUMIN 3.1*     CBC:  Recent Labs  02/16/17 0617 02/18/17 0442  WBC 7.2 6.6  NEUTROABS 4.4  --   HGB 9.6* 8.5*  HCT 29.2* 26.7*  MCV 92.7 93.7  PLT 136* 144*    No results found for this or any  previous visit (from the past 240 hour(s)).   Hospital Course:   This is a 71 years old male with history of multiple medical illnesses was re-admitted form [facility] due to acute exacerbation of COPD. He was treated with nebulizer treatment, IV steroid and antibiotics. Patient was evaluated by pulmonologist. He improved fast and his medications adjusted. He will be discharged on oral steroid and oral antibiotics.  Discharge Exam: Blood pressure (!) 131/40, pulse 90, temperature 98.6 F (37 C), temperature source Oral, resp. rate 20, height 6\' 1"  (1.854 m), weight 110 kg (242 lb 8 oz), SpO2 92 %.    Disposition:  Skilled Nursing Facility      Signed: Trella Thurmond   02/18/2017, 8:20 AM

## 2017-02-18 NOTE — Progress Notes (Signed)
[name] improved and is being transferred back to the skilled care facility. I will plan to sign off. Thanks for allowing me to see him with you

## 2017-02-18 NOTE — Progress Notes (Signed)
Patient to be discharged to [facility] today. Called report to [place], receiving nurse at [facility]. Pt to eat lunch and then be transferred via bed with nurse tech. Pt in stable condition awaiting discharge. Donavan Foil, RN

## 2017-02-19 ENCOUNTER — Non-Acute Institutional Stay (SKILLED_NURSING_FACILITY): Payer: Medicare HMO | Admitting: Internal Medicine

## 2017-02-19 ENCOUNTER — Encounter: Payer: Self-pay | Admitting: Internal Medicine

## 2017-02-19 DIAGNOSIS — I5042 Chronic combined systolic (congestive) and diastolic (congestive) heart failure: Secondary | ICD-10-CM | POA: Diagnosis not present

## 2017-02-19 DIAGNOSIS — J441 Chronic obstructive pulmonary disease with (acute) exacerbation: Secondary | ICD-10-CM | POA: Diagnosis not present

## 2017-02-19 DIAGNOSIS — N184 Chronic kidney disease, stage 4 (severe): Secondary | ICD-10-CM

## 2017-02-19 DIAGNOSIS — R569 Unspecified convulsions: Secondary | ICD-10-CM
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
## 2017-02-23 ENCOUNTER — Encounter (HOSPITAL_COMMUNITY)
Admission: RE | Admit: 2017-02-23 | Discharge: 2017-02-23 | Disposition: A | Discharge status: Home / Self Care | Payer: Medicare HMO | Source: Skilled Nursing Facility | Attending: Internal Medicine | Admitting: Internal Medicine

## 2017-02-23 DIAGNOSIS — J449 Chronic obstructive pulmonary disease, unspecified: Secondary | ICD-10-CM | POA: Insufficient documentation

## 2017-02-23 DIAGNOSIS — R279 Unspecified lack of coordination: Secondary | ICD-10-CM | POA: Insufficient documentation

## 2017-02-23 DIAGNOSIS — J9610 Chronic respiratory failure, unspecified whether with hypoxia or hypercapnia: Secondary | ICD-10-CM | POA: Insufficient documentation

## 2017-02-23 LAB — BASIC METABOLIC PANEL
Anion gap: 8 (ref 5–15)
BUN: 75 mg/dL — ABNORMAL HIGH (ref 6–20)
CALCIUM: 8.7 mg/dL — AB (ref 8.9–10.3)
CO2: 22 mmol/L (ref 22–32)
CREATININE: 2.35 mg/dL — AB (ref 0.61–1.24)
Chloride: 108 mmol/L (ref 101–111)
GFR calc Af Amer: 30 mL/min — ABNORMAL LOW (ref >60)
GFR, EST NON AFRICAN AMERICAN: 26 mL/min — AB (ref >60)
GLUCOSE: 85 mg/dL (ref 65–99)
Potassium: 4.2 mmol/L (ref 3.5–5.1)
SODIUM: 138 mmol/L (ref 135–145)

## 2017-02-23 LAB — CBC WITH DIFFERENTIAL/PLATELET
BASOS ABS: 0.1 10*3/uL (ref 0.0–0.1)
Basophils Relative: 1 %
EOS PCT: 11 %
Eosinophils Absolute: 0.8 10*3/uL — ABNORMAL HIGH (ref 0.0–0.7)
HCT: 28 % — ABNORMAL LOW (ref 39.0–52.0)
Hemoglobin: 9.3 g/dL — ABNORMAL LOW (ref 13.0–17.0)
LYMPHS ABS: 1.2 10*3/uL (ref 0.7–4.0)
LYMPHS PCT: 17 %
MCH: 30.7 pg (ref 26.0–34.0)
MCHC: 33.2 g/dL (ref 30.0–36.0)
MCV: 92.4 fL (ref 78.0–100.0)
MONO ABS: 0.6 10*3/uL (ref 0.1–1.0)
Monocytes Relative: 9 %
Neutro Abs: 4.4 10*3/uL (ref 1.7–7.7)
Neutrophils Relative %: 62 %
PLATELETS: 180 10*3/uL (ref 150–400)
RBC: 3.03 MIL/uL — ABNORMAL LOW (ref 4.22–5.81)
RDW: 15.5 % (ref 11.5–15.5)
WBC: 7 10*3/uL (ref 4.0–10.5)

## 2017-02-23 LAB — TSH: TSH: 0.153 u[IU]/mL — AB (ref 0.350–4.500)

## 2017-02-24 ENCOUNTER — Non-Acute Institutional Stay (SKILLED_NURSING_FACILITY): Payer: Medicare HMO | Admitting: Internal Medicine

## 2017-02-24 ENCOUNTER — Encounter: Payer: Self-pay | Admitting: Internal Medicine

## 2017-02-24 DIAGNOSIS — I5042 Chronic combined systolic (congestive) and diastolic (congestive) heart failure: Secondary | ICD-10-CM

## 2017-02-24 DIAGNOSIS — I77810 Thoracic aortic ectasia: Secondary | ICD-10-CM | POA: Diagnosis not present

## 2017-02-24 DIAGNOSIS — I10 Essential (primary) hypertension: Secondary | ICD-10-CM

## 2017-02-24 DIAGNOSIS — N184 Chronic kidney disease, stage 4 (severe): Secondary | ICD-10-CM

## 2017-02-24 DIAGNOSIS — J449 Chronic obstructive pulmonary disease, unspecified: Secondary | ICD-10-CM

## 2017-02-24 DIAGNOSIS — E1122 Type 2 diabetes mellitus with diabetic chronic kidney disease: Secondary | ICD-10-CM
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
## 2017-02-27 ENCOUNTER — Encounter (HOSPITAL_COMMUNITY)
Admission: RE | Admit: 2017-02-27 | Discharge: 2017-02-27 | Disposition: A | Discharge status: Home / Self Care | Payer: Medicare HMO | Source: Skilled Nursing Facility | Attending: *Deleted | Admitting: *Deleted

## 2017-02-27 LAB — OCCULT BLOOD X 1 CARD TO LAB, STOOL: FECAL OCCULT BLD: NEGATIVE

## 2017-03-03 ENCOUNTER — Non-Acute Institutional Stay (SKILLED_NURSING_FACILITY): Payer: Medicare HMO | Admitting: Internal Medicine

## 2017-03-03 ENCOUNTER — Encounter: Payer: Self-pay | Admitting: Internal Medicine

## 2017-03-03 DIAGNOSIS — I5042 Chronic combined systolic (congestive) and diastolic (congestive) heart failure: Secondary | ICD-10-CM | POA: Diagnosis not present

## 2017-03-03 DIAGNOSIS — N184 Chronic kidney disease, stage 4 (severe): Secondary | ICD-10-CM | POA: Diagnosis not present

## 2017-03-03 DIAGNOSIS — H578 Other specified disorders of eye and adnexa: Secondary | ICD-10-CM

## 2017-03-03 DIAGNOSIS — E1122 Type 2 diabetes mellitus with diabetic chronic kidney disease: Secondary | ICD-10-CM

## 2017-03-03 DIAGNOSIS — J449 Chronic obstructive pulmonary disease, unspecified: Secondary | ICD-10-CM

## 2017-03-03 DIAGNOSIS — H5789 Other specified disorders of eye and adnexa: Secondary | ICD-10-CM
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
## 2017-03-04 ENCOUNTER — Non-Acute Institutional Stay (SKILLED_NURSING_FACILITY): Payer: Medicare HMO | Admitting: Internal Medicine

## 2017-03-04 ENCOUNTER — Encounter (HOSPITAL_COMMUNITY)
Admission: RE | Admit: 2017-03-04 | Discharge: 2017-03-04 | Disposition: A | Discharge status: Home / Self Care | Payer: Medicare HMO | Source: Skilled Nursing Facility | Attending: Internal Medicine | Admitting: Internal Medicine

## 2017-03-04 ENCOUNTER — Encounter: Payer: Self-pay | Admitting: Internal Medicine

## 2017-03-04 DIAGNOSIS — J449 Chronic obstructive pulmonary disease, unspecified: Secondary | ICD-10-CM | POA: Diagnosis not present

## 2017-03-04 DIAGNOSIS — N184 Chronic kidney disease, stage 4 (severe): Secondary | ICD-10-CM

## 2017-03-04 DIAGNOSIS — I5033 Acute on chronic diastolic (congestive) heart failure: Secondary | ICD-10-CM

## 2017-03-04 DIAGNOSIS — E232 Diabetes insipidus: Secondary | ICD-10-CM

## 2017-03-04 DIAGNOSIS — G40909 Epilepsy, unspecified, not intractable, without status epilepticus: Secondary | ICD-10-CM | POA: Diagnosis not present

## 2017-03-04 DIAGNOSIS — E033 Postinfectious hypothyroidism: Secondary | ICD-10-CM

## 2017-03-04 DIAGNOSIS — R279 Unspecified lack of coordination: Secondary | ICD-10-CM | POA: Insufficient documentation

## 2017-03-04 DIAGNOSIS — J9610 Chronic respiratory failure, unspecified whether with hypoxia or hypercapnia: Secondary | ICD-10-CM | POA: Insufficient documentation

## 2017-03-04 LAB — CBC WITH DIFFERENTIAL/PLATELET
BASOS ABS: 0 10*3/uL (ref 0.0–0.1)
BASOS PCT: 1 %
EOS ABS: 0.4 10*3/uL (ref 0.0–0.7)
Eosinophils Relative: 7 %
HEMATOCRIT: 27.9 % — AB (ref 39.0–52.0)
HEMOGLOBIN: 9.1 g/dL — AB (ref 13.0–17.0)
Lymphocytes Relative: 15 %
Lymphs Abs: 0.9 10*3/uL (ref 0.7–4.0)
MCH: 30 pg (ref 26.0–34.0)
MCHC: 32.6 g/dL (ref 30.0–36.0)
MCV: 92.1 fL (ref 78.0–100.0)
MONOS PCT: 8 %
Monocytes Absolute: 0.5 10*3/uL (ref 0.1–1.0)
NEUTROS ABS: 4.4 10*3/uL (ref 1.7–7.7)
NEUTROS PCT: 71 %
Platelets: 152 10*3/uL (ref 150–400)
RBC: 3.03 MIL/uL — AB (ref 4.22–5.81)
RDW: 16.1 % — ABNORMAL HIGH (ref 11.5–15.5)
WBC: 6.2 10*3/uL (ref 4.0–10.5)

## 2017-03-04 LAB — BASIC METABOLIC PANEL
ANION GAP: 9 (ref 5–15)
BUN: 68 mg/dL — ABNORMAL HIGH (ref 6–20)
CHLORIDE: 106 mmol/L (ref 101–111)
CO2: 21 mmol/L — AB (ref 22–32)
Calcium: 8.6 mg/dL — ABNORMAL LOW (ref 8.9–10.3)
Creatinine, Ser: 2.4 mg/dL — ABNORMAL HIGH (ref 0.61–1.24)
GFR calc non Af Amer: 25 mL/min — ABNORMAL LOW (ref >60)
GFR, EST AFRICAN AMERICAN: 29 mL/min — AB (ref >60)
Glucose, Bld: 86 mg/dL (ref 65–99)
POTASSIUM: 3.9 mmol/L (ref 3.5–5.1)
SODIUM: 136 mmol/L (ref 135–145)
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
## 2017-03-09 ENCOUNTER — Encounter (HOSPITAL_COMMUNITY): Payer: Self-pay | Admitting: Emergency Medicine

## 2017-03-09 ENCOUNTER — Inpatient Hospital Stay (HOSPITAL_COMMUNITY)
Admission: EM | Admit: 2017-03-09 | Discharge: 2017-03-12 | DRG: 100 | Disposition: A | Discharge status: Home Health | Payer: Medicare HMO | Attending: Internal Medicine | Admitting: Internal Medicine

## 2017-03-09 ENCOUNTER — Emergency Department (HOSPITAL_COMMUNITY): Payer: Medicare HMO

## 2017-03-09 DIAGNOSIS — G40409 Other generalized epilepsy and epileptic syndromes, not intractable, without status epilepticus: Principal | ICD-10-CM | POA: Diagnosis present

## 2017-03-09 DIAGNOSIS — E785 Hyperlipidemia, unspecified: Secondary | ICD-10-CM | POA: Diagnosis present

## 2017-03-09 DIAGNOSIS — J9620 Acute and chronic respiratory failure, unspecified whether with hypoxia or hypercapnia: Secondary | ICD-10-CM | POA: Diagnosis present

## 2017-03-09 DIAGNOSIS — R569 Unspecified convulsions: Secondary | ICD-10-CM | POA: Diagnosis not present

## 2017-03-09 DIAGNOSIS — E230 Hypopituitarism: Secondary | ICD-10-CM | POA: Diagnosis present

## 2017-03-09 DIAGNOSIS — D638 Anemia in other chronic diseases classified elsewhere: Secondary | ICD-10-CM | POA: Diagnosis present

## 2017-03-09 DIAGNOSIS — E1151 Type 2 diabetes mellitus with diabetic peripheral angiopathy without gangrene: Secondary | ICD-10-CM | POA: Diagnosis present

## 2017-03-09 DIAGNOSIS — J849 Interstitial pulmonary disease, unspecified: Secondary | ICD-10-CM | POA: Diagnosis not present

## 2017-03-09 DIAGNOSIS — J449 Chronic obstructive pulmonary disease, unspecified: Secondary | ICD-10-CM | POA: Diagnosis not present

## 2017-03-09 DIAGNOSIS — Z9981 Dependence on supplemental oxygen: Secondary | ICD-10-CM

## 2017-03-09 DIAGNOSIS — D352 Benign neoplasm of pituitary gland: Secondary | ICD-10-CM | POA: Diagnosis present

## 2017-03-09 DIAGNOSIS — G4733 Obstructive sleep apnea (adult) (pediatric): Secondary | ICD-10-CM | POA: Diagnosis present

## 2017-03-09 DIAGNOSIS — E669 Obesity, unspecified: Secondary | ICD-10-CM | POA: Diagnosis present

## 2017-03-09 DIAGNOSIS — R32 Unspecified urinary incontinence: Secondary | ICD-10-CM | POA: Diagnosis present

## 2017-03-09 DIAGNOSIS — R4182 Altered mental status, unspecified: Secondary | ICD-10-CM | POA: Diagnosis not present

## 2017-03-09 DIAGNOSIS — N184 Chronic kidney disease, stage 4 (severe): Secondary | ICD-10-CM | POA: Diagnosis present

## 2017-03-09 DIAGNOSIS — D649 Anemia, unspecified: Secondary | ICD-10-CM | POA: Diagnosis present

## 2017-03-09 DIAGNOSIS — J441 Chronic obstructive pulmonary disease with (acute) exacerbation: Secondary | ICD-10-CM | POA: Diagnosis present

## 2017-03-09 DIAGNOSIS — G40909 Epilepsy, unspecified, not intractable, without status epilepticus: Secondary | ICD-10-CM | POA: Diagnosis not present

## 2017-03-09 DIAGNOSIS — Z809 Family history of malignant neoplasm, unspecified: Secondary | ICD-10-CM

## 2017-03-09 DIAGNOSIS — D496 Neoplasm of unspecified behavior of brain: Secondary | ICD-10-CM

## 2017-03-09 DIAGNOSIS — Z905 Acquired absence of kidney: Secondary | ICD-10-CM

## 2017-03-09 DIAGNOSIS — I4581 Long QT syndrome: Secondary | ICD-10-CM | POA: Diagnosis present

## 2017-03-09 DIAGNOSIS — E039 Hypothyroidism, unspecified: Secondary | ICD-10-CM | POA: Diagnosis present

## 2017-03-09 DIAGNOSIS — R42 Dizziness and giddiness: Secondary | ICD-10-CM | POA: Diagnosis not present

## 2017-03-09 DIAGNOSIS — E1122 Type 2 diabetes mellitus with diabetic chronic kidney disease: Secondary | ICD-10-CM | POA: Diagnosis present

## 2017-03-09 DIAGNOSIS — Z8249 Family history of ischemic heart disease and other diseases of the circulatory system: Secondary | ICD-10-CM

## 2017-03-09 DIAGNOSIS — I4510 Unspecified right bundle-branch block: Secondary | ICD-10-CM | POA: Diagnosis not present

## 2017-03-09 DIAGNOSIS — Z6832 Body mass index (BMI) 32.0-32.9, adult: Secondary | ICD-10-CM | POA: Diagnosis not present

## 2017-03-09 DIAGNOSIS — Z87891 Personal history of nicotine dependence: Secondary | ICD-10-CM

## 2017-03-09 DIAGNOSIS — I130 Hypertensive heart and chronic kidney disease with heart failure and stage 1 through stage 4 chronic kidney disease, or unspecified chronic kidney disease: Secondary | ICD-10-CM | POA: Diagnosis not present

## 2017-03-09 DIAGNOSIS — I351 Nonrheumatic aortic (valve) insufficiency: Secondary | ICD-10-CM | POA: Diagnosis present

## 2017-03-09 DIAGNOSIS — I272 Pulmonary hypertension, unspecified: Secondary | ICD-10-CM | POA: Diagnosis present

## 2017-03-09 DIAGNOSIS — Z85528 Personal history of other malignant neoplasm of kidney: Secondary | ICD-10-CM

## 2017-03-09 DIAGNOSIS — Z7952 Long term (current) use of systemic steroids: Secondary | ICD-10-CM

## 2017-03-09 DIAGNOSIS — I5043 Acute on chronic combined systolic (congestive) and diastolic (congestive) heart failure: Secondary | ICD-10-CM | POA: Diagnosis not present

## 2017-03-09 DIAGNOSIS — Z79899 Other long term (current) drug therapy: Secondary | ICD-10-CM

## 2017-03-09 DIAGNOSIS — I509 Heart failure, unspecified: Secondary | ICD-10-CM | POA: Diagnosis not present

## 2017-03-09 LAB — CBC WITH DIFFERENTIAL/PLATELET
Basophils Absolute: 0 10*3/uL (ref 0.0–0.1)
Basophils Relative: 1 %
Eosinophils Absolute: 0.7 10*3/uL (ref 0.0–0.7)
Eosinophils Relative: 12 %
HCT: 28.6 % — ABNORMAL LOW (ref 39.0–52.0)
HEMOGLOBIN: 9.3 g/dL — AB (ref 13.0–17.0)
LYMPHS ABS: 0.6 10*3/uL — AB (ref 0.7–4.0)
Lymphocytes Relative: 10 %
MCH: 29.9 pg (ref 26.0–34.0)
MCHC: 32.5 g/dL (ref 30.0–36.0)
MCV: 92 fL (ref 78.0–100.0)
MONOS PCT: 11 %
Monocytes Absolute: 0.7 10*3/uL (ref 0.1–1.0)
NEUTROS ABS: 4.1 10*3/uL (ref 1.7–7.7)
NEUTROS PCT: 66 %
Platelets: 127 10*3/uL — ABNORMAL LOW (ref 150–400)
RBC: 3.11 MIL/uL — ABNORMAL LOW (ref 4.22–5.81)
RDW: 16.3 % — ABNORMAL HIGH (ref 11.5–15.5)
WBC: 6.1 10*3/uL (ref 4.0–10.5)

## 2017-03-09 LAB — COMPREHENSIVE METABOLIC PANEL
ALK PHOS: 43 U/L (ref 38–126)
ALT: 17 U/L (ref 17–63)
ANION GAP: 8 (ref 5–15)
AST: 17 U/L (ref 15–41)
Albumin: 3.3 g/dL — ABNORMAL LOW (ref 3.5–5.0)
BILIRUBIN TOTAL: 0.6 mg/dL (ref 0.3–1.2)
BUN: 70 mg/dL — ABNORMAL HIGH (ref 6–20)
CALCIUM: 8.4 mg/dL — AB (ref 8.9–10.3)
CO2: 26 mmol/L (ref 22–32)
Chloride: 98 mmol/L — ABNORMAL LOW (ref 101–111)
Creatinine, Ser: 2.67 mg/dL — ABNORMAL HIGH (ref 0.61–1.24)
GFR calc Af Amer: 26 mL/min — ABNORMAL LOW (ref >60)
GFR, EST NON AFRICAN AMERICAN: 22 mL/min — AB (ref >60)
GLUCOSE: 110 mg/dL — AB (ref 65–99)
Potassium: 3.6 mmol/L (ref 3.5–5.1)
Sodium: 132 mmol/L — ABNORMAL LOW (ref 135–145)
TOTAL PROTEIN: 6.7 g/dL (ref 6.5–8.1)

## 2017-03-09 LAB — URINALYSIS, ROUTINE W REFLEX MICROSCOPIC
BILIRUBIN URINE: NEGATIVE
Glucose, UA: NEGATIVE mg/dL
Hgb urine dipstick: NEGATIVE
KETONES UR: NEGATIVE mg/dL
Leukocytes, UA: NEGATIVE
NITRITE: NEGATIVE
PH: 5 (ref 5.0–8.0)
Protein, ur: NEGATIVE mg/dL
Specific Gravity, Urine: 1.006 (ref 1.005–1.030)

## 2017-03-09 LAB — PHENYTOIN LEVEL, TOTAL: Phenytoin Lvl: 15.6 ug/mL (ref 10.0–20.0)

## 2017-03-09 LAB — CBG MONITORING, ED: Glucose-Capillary: 124 mg/dL — ABNORMAL HIGH (ref 65–99)

## 2017-03-09 LAB — GLUCOSE, CAPILLARY: Glucose-Capillary: 124 mg/dL — ABNORMAL HIGH (ref 65–99)

## 2017-03-09 MED ORDER — DEXAMETHASONE 0.5 MG PO TABS
1.5000 mg | ORAL_TABLET | Freq: Every day | ORAL | Status: DC
  Filled 2017-03-09 (×3): qty 3

## 2017-03-09 MED ORDER — PHENYTOIN SODIUM 50 MG/ML IJ SOLN
INTRAMUSCULAR | Status: AC
  Filled 2017-03-09: qty 2

## 2017-03-09 MED ORDER — SODIUM CHLORIDE 0.9 % IV SOLN: INTRAVENOUS | Status: DC

## 2017-03-09 MED ORDER — LORAZEPAM 2 MG/ML IJ SOLN: 1.0000 mg | INTRAMUSCULAR | Status: DC | PRN

## 2017-03-09 MED ORDER — INSULIN ASPART 100 UNIT/ML ~~LOC~~ SOLN: 0.0000 [IU] | Freq: Every day | SUBCUTANEOUS | Status: DC

## 2017-03-09 MED ORDER — PHENYTOIN SODIUM 50 MG/ML IJ SOLN
100.0000 mg | Freq: Three times a day (TID) | INTRAMUSCULAR | Status: DC
  Administered 2017-03-09 – 2017-03-11 (×5): 100 mg via INTRAVENOUS
  Filled 2017-03-09 (×4): qty 2

## 2017-03-09 MED ORDER — IPRATROPIUM-ALBUTEROL 0.5-2.5 (3) MG/3ML IN SOLN
3.0000 mL | Freq: Four times a day (QID) | RESPIRATORY_TRACT | Status: DC
  Administered 2017-03-09 – 2017-03-12 (×11): 3 mL via RESPIRATORY_TRACT
  Filled 2017-03-09 (×11): qty 3

## 2017-03-09 MED ORDER — INSULIN ASPART 100 UNIT/ML ~~LOC~~ SOLN: 0.0000 [IU] | Freq: Three times a day (TID) | SUBCUTANEOUS | Status: DC

## 2017-03-09 MED ORDER — SODIUM CHLORIDE 0.9 % IV BOLUS (SEPSIS)
1000.0000 mL | Freq: Once | INTRAVENOUS | Status: AC
  Administered 2017-03-09: 1000 mL via INTRAVENOUS

## 2017-03-09 MED ORDER — DESMOPRESSIN ACETATE 0.2 MG PO TABS
0.2000 mg | ORAL_TABLET | Freq: Two times a day (BID) | ORAL | Status: DC
  Administered 2017-03-09 – 2017-03-12 (×6): 0.2 mg via ORAL
  Filled 2017-03-09 (×8): qty 1

## 2017-03-09 MED ORDER — CALCITRIOL 0.25 MCG PO CAPS
0.5000 ug | ORAL_CAPSULE | Freq: Every day | ORAL | Status: DC
  Administered 2017-03-09 – 2017-03-12 (×4): 0.5 ug via ORAL
  Filled 2017-03-09 (×4): qty 2

## 2017-03-09 MED ORDER — LEVOTHYROXINE SODIUM 100 MCG PO TABS
125.0000 ug | ORAL_TABLET | Freq: Every day | ORAL | Status: DC
  Administered 2017-03-10 – 2017-03-12 (×3): 125 ug via ORAL
  Filled 2017-03-09 (×3): qty 1

## 2017-03-09 MED ORDER — ENOXAPARIN SODIUM 40 MG/0.4ML ~~LOC~~ SOLN
40.0000 mg | SUBCUTANEOUS | Status: DC
  Administered 2017-03-09 – 2017-03-11 (×3): 40 mg via SUBCUTANEOUS
  Filled 2017-03-09 (×3): qty 0.4

## 2017-03-09 MED ORDER — DESMOPRESSIN ACETATE 0.2 MG PO TABS
ORAL_TABLET | ORAL | Status: AC
  Filled 2017-03-09: qty 1

## 2017-03-09 MED ORDER — IPRATROPIUM-ALBUTEROL 0.5-2.5 (3) MG/3ML IN SOLN: 3.0000 mL | Freq: Two times a day (BID) | RESPIRATORY_TRACT | Status: DC

## 2017-03-09 MED ORDER — FUROSEMIDE 10 MG/ML IJ SOLN
40.0000 mg | Freq: Every day | INTRAMUSCULAR | Status: DC
  Administered 2017-03-09 – 2017-03-12 (×4): 40 mg via INTRAVENOUS
  Filled 2017-03-09 (×4): qty 4

## 2017-03-09 MED ORDER — POLYVINYL ALCOHOL 1.4 % OP SOLN
1.0000 [drp] | Freq: Two times a day (BID) | OPHTHALMIC | Status: DC
  Administered 2017-03-09 – 2017-03-12 (×6): 1 [drp] via OPHTHALMIC
  Filled 2017-03-09: qty 15

## 2017-03-09 MED ORDER — HYDROCODONE-ACETAMINOPHEN 10-325 MG PO TABS: 1.0000 | ORAL_TABLET | ORAL | Status: DC | PRN

## 2017-03-09 MED ORDER — PREDNISONE 20 MG PO TABS
40.0000 mg | ORAL_TABLET | Freq: Every day | ORAL | Status: AC
  Administered 2017-03-09 – 2017-03-11 (×3): 40 mg via ORAL
  Filled 2017-03-09 (×3): qty 2
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
## 2017-03-09 NOTE — ED Triage Notes (Signed)
Pt at kitchen table. Home health nurse witnessed patient have seizure. Duration 2 minutes. Upper body involved. No pain. A&O.

## 2017-03-09 NOTE — Progress Notes (Signed)
Patient has wheezing upon assessment.  Patient reports taking Furosemide and breathing treatments at home.  Dr. Karleen Hampshire notified via text page.

## 2017-03-10 DIAGNOSIS — R569 Unspecified convulsions: Secondary | ICD-10-CM | POA: Diagnosis not present

## 2017-03-10 DIAGNOSIS — R4182 Altered mental status, unspecified: Secondary | ICD-10-CM | POA: Diagnosis not present

## 2017-03-10 LAB — GLUCOSE, CAPILLARY
GLUCOSE-CAPILLARY: 96 mg/dL (ref 65–99)
GLUCOSE-CAPILLARY: 84 mg/dL (ref 65–99)
GLUCOSE-CAPILLARY: 212 mg/dL — AB (ref 65–99)
Glucose-Capillary: 82 mg/dL (ref 65–99)

## 2017-03-10 LAB — T4, FREE: FREE T4: 0.5 ng/dL — AB (ref 0.61–1.12)

## 2017-03-10 MED ORDER — LEVETIRACETAM 250 MG PO TABS
250.0000 mg | ORAL_TABLET | Freq: Two times a day (BID) | ORAL | Status: DC
  Administered 2017-03-10 – 2017-03-12 (×4): 250 mg via ORAL
  Filled 2017-03-10 (×4): qty 1

## 2017-03-10 NOTE — Progress Notes (Signed)
Subjective: Patient was admitted after he had an episode of seizure at home.  [name] on dilantin and he has been taking his medication regularly and his blood level is therapeutic. Patient is also known case of COPD and CHF and he had also shortness of breath. Patient feels better this morning.  Objective: Vital signs in last 24 hours: Temp:  [98.3 F (36.8 C)-98.7 F (37.1 C)] 98.7 F (37.1 C) (04/03 0500) Pulse Rate:  [73-84] 73 (04/03 0500) Resp:  [12-30] 19 (04/03 0500) BP: (101-131)/(43-81) 127/68 (04/03 0500) SpO2:  [99 %-100 %] 99 % (04/03 0746) Weight:  [110.5 kg (243 lb 9.7 oz)-112 kg (247 lb)] 110.5 kg (243 lb 9.7 oz) (04/03 0500) Weight change:  Last BM Date: 03/08/17  Intake/Output from previous day: 04/02 0701 - 04/03 0700 In: 1000 [IV Piggyback:1000] Out: 1425 [Urine:1425]  PHYSICAL EXAM General appearance: alert and moderately obese Resp: diminished breath sounds bilaterally and rhonchi bilaterally Cardio: S1, S2 normal GI: soft, non-tender; bowel sounds normal; no masses,  no organomegaly Extremities: extremities normal, atraumatic, no cyanosis or edema  Lab Results:  Results for orders placed or performed during the hospital encounter of 03/09/17 (from the past 48 hour(s))  CBG monitoring, ED     Status: Abnormal   Collection Time: 03/09/17 12:44 PM  Result Value Ref Range   Glucose-Capillary 124 (H) 65 - 99 mg/dL  CBC with Differential     Status: Abnormal   Collection Time: 03/09/17  1:35 PM  Result Value Ref Range   WBC 6.1 4.0 - 10.5 K/uL   RBC 3.11 (L) 4.22 - 5.81 MIL/uL   Hemoglobin 9.3 (L) 13.0 - 17.0 g/dL   HCT 28.6 (L) 39.0 - 52.0 %   MCV 92.0 78.0 - 100.0 fL   MCH 29.9 26.0 - 34.0 pg   MCHC 32.5 30.0 - 36.0 g/dL   RDW 16.3 (H) 11.5 - 15.5 %   Platelets 127 (L) 150 - 400 K/uL   Neutrophils Relative % 66 %   Neutro Abs 4.1 1.7 - 7.7 K/uL   Lymphocytes Relative 10 %   Lymphs Abs 0.6 (L) 0.7 - 4.0 K/uL   Monocytes Relative 11 %   Monocytes  Absolute 0.7 0.1 - 1.0 K/uL   Eosinophils Relative 12 %   Eosinophils Absolute 0.7 0.0 - 0.7 K/uL   Basophils Relative 1 %   Basophils Absolute 0.0 0.0 - 0.1 K/uL  Comprehensive metabolic panel     Status: Abnormal   Collection Time: 03/09/17  1:35 PM  Result Value Ref Range   Sodium 132 (L) 135 - 145 mmol/L   Potassium 3.6 3.5 - 5.1 mmol/L   Chloride 98 (L) 101 - 111 mmol/L   CO2 26 22 - 32 mmol/L   Glucose, Bld 110 (H) 65 - 99 mg/dL   BUN 70 (H) 6 - 20 mg/dL   Creatinine, Ser 2.67 (H) 0.61 - 1.24 mg/dL   Calcium 8.4 (L) 8.9 - 10.3 mg/dL   Total Protein 6.7 6.5 - 8.1 g/dL   Albumin 3.3 (L) 3.5 - 5.0 g/dL   AST 17 15 - 41 U/L   ALT 17 17 - 63 U/L   Alkaline Phosphatase 43 38 - 126 U/L   Total Bilirubin 0.6 0.3 - 1.2 mg/dL   GFR calc non Af Amer 22 (L) >60 mL/min   GFR calc Af Amer 26 (L) >60 mL/min    Comment: (NOTE) The eGFR has been calculated using the CKD EPI equation. This  calculation has not been validated in all clinical situations. eGFR's persistently <60 mL/min signify possible Chronic Kidney Disease.    Anion gap 8 5 - 15  Phenytoin level, total     Status: None   Collection Time: 03/09/17  1:35 PM  Result Value Ref Range   Phenytoin Lvl 15.6 10.0 - 20.0 ug/mL  Urinalysis, Routine w reflex microscopic     Status: None   Collection Time: 03/09/17  3:18 PM  Result Value Ref Range   Color, Urine YELLOW YELLOW   APPearance CLEAR CLEAR   Specific Gravity, Urine 1.006 1.005 - 1.030   pH 5.0 5.0 - 8.0   Glucose, UA NEGATIVE NEGATIVE mg/dL   Hgb urine dipstick NEGATIVE NEGATIVE   Bilirubin Urine NEGATIVE NEGATIVE   Ketones, ur NEGATIVE NEGATIVE mg/dL   Protein, ur NEGATIVE NEGATIVE mg/dL   Nitrite NEGATIVE NEGATIVE   Leukocytes, UA NEGATIVE NEGATIVE  Glucose, capillary     Status: Abnormal   Collection Time: 03/09/17  9:13 PM  Result Value Ref Range   Glucose-Capillary 124 (H) 65 - 99 mg/dL   Comment 1 Notify RN    Comment 2 Document in Chart     ABGS No  results for input(s): PHART, PO2ART, TCO2, HCO3 in the last 72 hours.  Invalid input(s): PCO2 CULTURES No results found for this or any previous visit (from the past 240 hour(s)). Studies/Results: Ct Head Wo Contrast  Result Date: 03/09/2017 CLINICAL DATA:  Seizure about 2 minutes today, history of seizures EXAM: CT HEAD WITHOUT CONTRAST TECHNIQUE: Contiguous axial images were obtained from the base of the skull through the vertex without intravenous contrast. COMPARISON:  02/10/2017 FINDINGS: Brain: No intracranial hemorrhage, mass effect or midline shift. Stable cerebral atrophy. Stable encephalomalacia in left frontal lobe. Again noted lobulated lesion sella region suspicious for pituitary mass stable. No definite acute cortical infarction. Vascular: Atherosclerotic calcifications of carotid siphon again noted. Atherosclerotic calcifications of left vertebral artery Skull: Again noted prior craniotomy and fixation plate frontal region. Sinuses/Orbits: No acute findings Other: None IMPRESSION: No acute intracranial abnormality. Stable atrophy and chronic white matter disease. No definite acute cortical infarction. Lobulated lesion in sellar region is stable. Again noted encephalomalacia in left frontal lobe stable. Electronically Signed   By: Lahoma Crocker M.D.   On: 03/09/2017 14:25   Dg Chest Port 1 View  Result Date: 03/09/2017 CLINICAL DATA:  Seizure activity, former smoker. EXAM: PORTABLE CHEST 1 VIEW COMPARISON:  Chest x-ray of February 16, 2017 FINDINGS: The lungs are reasonably well inflated. The interstitial markings remain increased. The pulmonary vascularity remains prominent. The cardiac silhouette remains enlarged. The retrocardiac region on the left is more dense today. No definite pleural effusion is observed. The observed bony thorax is unremarkable. IMPRESSION: CHF with mild pulmonary interstitial edema more conspicuous than on the previous study. There is no alveolar pneumonia. Electronically  Signed   By: David  Martinique M.D.   On: 03/09/2017 13:34    Medications: I have reviewed the patient's current medications.  Assesment:  Active Problems:   Seizure ([place])  COPD exacerbation Acute on chronic diastolic CHF CKD IV   Plan:  Medications reviewed Continue current treatment Will do neurology consult Continue telemetry    LOS: 1 day   Athel Merriweather 03/10/2017, 8:13 AM

## 2017-03-10 NOTE — Care Management Note (Signed)
Case Management Note  Patient Details  Name: [name] [name] MRN: [id number] Date of Birth: [birth year]  Subjective/Objective:                  Admitted with siezures. Pt is from home, lives alone and recieves assistance from sister and daughter. Pt DC'd from [facility] SN[facility] 5 [facility] ago. [name] active with [facility] for nursing and PT services. [facility] rep, is aware of admission. He has home O2, walker and bipap. [name] confused about whether or not he is supposed to be checking his BS at home. [name] unsure if he has glucometer.   Action/Plan: Pt plans to return home with resumption of HH serivces through [facility]. CM will f/u on blood sugar checks. CM will cont to follow.   Expected Discharge Date:    03/12/2017              Expected Discharge Plan:  [place] [facility]  In-House Referral:  NA  Discharge planning Services  CM Consult  Post Acute Care Choice:  Home Health, Resumption of Svcs/PTA Provider Choice offered to:  Patient  HH Arranged:  RN, PT [facility] : [facility] Agency:  [place]  Status of Service:  In process, will continue to follow  Sherald Barge, RN 03/10/2017, 11:28 AM
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
## 2017-03-11 ENCOUNTER — Inpatient Hospital Stay (HOSPITAL_COMMUNITY)
Admit: 2017-03-11 | Discharge: 2017-03-11 | Disposition: A | Discharge status: Home / Self Care | Payer: Medicare HMO | Attending: Neurology | Admitting: Neurology

## 2017-03-11 ENCOUNTER — Inpatient Hospital Stay (HOSPITAL_COMMUNITY): Payer: Medicare HMO

## 2017-03-11 DIAGNOSIS — R4182 Altered mental status, unspecified: Secondary | ICD-10-CM | POA: Diagnosis not present

## 2017-03-11 DIAGNOSIS — R569 Unspecified convulsions: Secondary | ICD-10-CM | POA: Diagnosis not present

## 2017-03-11 LAB — BASIC METABOLIC PANEL
Anion gap: 9 (ref 5–15)
BUN: 71 mg/dL — ABNORMAL HIGH (ref 6–20)
CHLORIDE: 99 mmol/L — AB (ref 101–111)
CO2: 27 mmol/L (ref 22–32)
Calcium: 8.5 mg/dL — ABNORMAL LOW (ref 8.9–10.3)
Creatinine, Ser: 2.15 mg/dL — ABNORMAL HIGH (ref 0.61–1.24)
GFR calc Af Amer: 34 mL/min — ABNORMAL LOW (ref >60)
GFR calc non Af Amer: 29 mL/min — ABNORMAL LOW (ref >60)
Glucose, Bld: 122 mg/dL — ABNORMAL HIGH (ref 65–99)
POTASSIUM: 3.6 mmol/L (ref 3.5–5.1)
SODIUM: 135 mmol/L (ref 135–145)

## 2017-03-11 LAB — GLUCOSE, CAPILLARY
GLUCOSE-CAPILLARY: 124 mg/dL — AB (ref 65–99)
GLUCOSE-CAPILLARY: 127 mg/dL — AB (ref 65–99)
GLUCOSE-CAPILLARY: 93 mg/dL (ref 65–99)
Glucose-Capillary: 154 mg/dL — ABNORMAL HIGH (ref 65–99)

## 2017-03-11 LAB — T3, FREE: T3 FREE: 1.4 pg/mL — AB (ref 2.0–4.4)

## 2017-03-11 MED ORDER — PHENYTOIN SODIUM 50 MG/ML IJ SOLN
100.0000 mg | Freq: Once | INTRAMUSCULAR | Status: AC
  Administered 2017-03-11: 100 mg via INTRAVENOUS
  Filled 2017-03-11: qty 2

## 2017-03-11 MED ORDER — PHENYTOIN SODIUM EXTENDED 100 MG PO CAPS
300.0000 mg | ORAL_CAPSULE | Freq: Every day | ORAL | Status: DC
  Administered 2017-03-11: 300 mg via ORAL
  Filled 2017-03-11: qty 3

## 2017-03-11 NOTE — Progress Notes (Signed)
MEDICATION RELATED CONSULT NOTE - INITIAL   Pharmacy Consult for Convert Dilantin to PO  No Known Allergies  Patient Measurements: Height: 6\' 1"  (185.4 cm) Weight: 241 lb 3.2 oz (109.4 kg) IBW/kg (Calculated) : 79.9  Vital Signs: Temp: 98.4 F (36.9 C) (04/04 0600) Temp Source: Oral (04/04 0600) BP: 130/43 (04/04 0600) Pulse Rate: 79 (04/04 0600) Intake/Output from previous day: 04/03 0701 - 04/04 0700 In: 480 [P.O.:480] Out: 1050 [Urine:1050] Intake/Output from this shift: No intake/output data recorded.  Labs:  Recent Labs  03/09/17 1335 03/11/17 0502  WBC 6.1  --   HGB 9.3*  --   HCT 28.6*  --   PLT 127*  --   CREATININE 2.67* 2.15*  ALBUMIN 3.3*  --   PROT 6.7  --   AST 17  --   ALT 17  --   ALKPHOS 43  --   BILITOT 0.6  --    Estimated Creatinine Clearance: 40.3 mL/min (A) (by C-G formula based on SCr of 2.15 mg/dL (H)).  Medical History: Past Medical History:  Diagnosis Date  . Aortic insufficiency 10/2012   a. Mod-severe, not an operable candidate.  . Candida esophagitis ([place])    a. Probable by EGD 03/2015.  . Cellulitis of lower leg   . Chronic obstructive pulmonary disease (HCC)    Chronic bronchitis;  home oxygen; multiple exacerbations  . Chronic respiratory failure ([place])   . Chronic systolic CHF (congestive heart failure) ([place])    a.  Last echo 11/2014: EF 40-45%, diffuse HK, mild LVH, elevated LVEDP, mod-severe AI with severely thickened leaflets, dilated sinus of valsalva 4.5cm, visualized portion of prox ascending aorta 4.7cm, mild MR, mildly dilated LA/RV/RA, PASP 60. LV dysfunction felt due to AI. Cath 12/2014 with minimal CAD - 20% LM otherwise minimal luminal irregularities.  . CKD (chronic kidney disease), stage IV (HCC)    S/P right nephrectomy for hypernephroma in 2010  . Diabetes mellitus, type 2 ([place])   . GI bleed    a. upper GIB in 03/2015 wit thrombocytopenia and ABL anemia. b. Colonoscopy with pooling of dark burgundy blood noted  throughout colon but without obvious bleeding lesion. EGD 03/07/2015 showed probable candida esophagitis, mild gastritis, source for GI bleed not seen. c. Capsule study showed active bleeding at 2 hours into the SB.  Marland Kitchen Hyperlipidemia    No lipid profile available  . Hypertension   . Hypothyroidism    10/2002: TSH-0.43, T4-0.77  . Obesity 10/28/2012  . On home O2    2L N/C  . Panhypopituitarism ([place])    Following pituitary excision by craniotomy of a craniopharyngioma; chronic encephalomalacia of the left frontal lobe  . Pulmonary hypertension   . RBBB   . Seizure disorder ([place])    Onset after craniotomy  . Sleep apnea    Severe on a sleep study in 12/2010  . Tobacco abuse, in remission    20 pack years; discontinued 1998   Medications:  Prescriptions Prior to Admission  Medication Sig Dispense Refill Last Dose  . acetaminophen (TYLENOL) 325 MG tablet Take 650 mg by mouth every 6 (six) hours as needed.   unknown  . amLODipine (NORVASC) 10 MG tablet Take 1 tablet (10 mg total) by mouth daily. 30 tablet 2   . calcitRIOL (ROCALTROL) 0.5 MCG capsule Take 0.5 mcg by mouth daily.     . Cholecalciferol (VITAMIN D3) 2000 units capsule Take 2,000 Units by mouth daily.    at    . desmopressin (  DDAVP) 0.2 MG tablet Take 1 tablet (0.2 mg total) by mouth 2 (two) times daily. 60 tablet 12 Taking  . dexamethasone (DECADRON) 1.5 MG tablet Take 1.5 mg by mouth daily.   Taking  . HYDROcodone-acetaminophen (NORCO) 10-325 MG tablet Take 1 tablet by mouth every 4 (four) hours as needed for moderate pain. 30 tablet 0 Taking  . ipratropium-albuterol (DUONEB) 0.5-2.5 (3) MG/3ML SOLN Take 3 mLs by nebulization 2 (two) times daily. And additional every four hours as needed   Taking  . isosorbide-hydrALAZINE (BIDIL) 20-37.5 MG tablet Take 1 tablet by mouth 3 (three) times daily. 90 tablet 3   . levothyroxine (SYNTHROID, LEVOTHROID) 125 MCG tablet Take 125 mcg by mouth daily before breakfast.   Taking  . OXYGEN 02  @@2  L pm via nasal cannula every shift   Taking  . phenytoin (DILANTIN) 100 MG ER capsule Take 100 mg by mouth 3 (three) times daily.   Taking  . polyvinyl alcohol (LIQUIFILM TEARS) 1.4 % ophthalmic solution Place 1 drop into both eyes 2 (two) times daily.     Marland Kitchen testosterone cypionate (DEPOTESTOTERONE CYPIONATE) 100 MG/ML injection For IM use only. Give 1 ml once a day on the 19 of the month   Taking  . torsemide (DEMADEX) 20 MG tablet Take 40 mg by mouth daily.   Taking   Assessment: 71 year old [place] American male who presents with confusion and altered mental status. He was witnessed by the nurse to have a generalized tonic-clonic seizure. The patient had a craniotomy done about 15 years ago in [place]. He was subsequently sent to [facility] where he underwent another procedure. He did have seizures at that time.   The patient presents with breakthrough seizures while being compliant with Dilantin treatment. 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 Pt has been on Dilantin 100mg  IV q8h, asked to switch to PO.  Goal of Therapy:  Corrected Dilantin level 10-20  Plan:  Dilantin ER capsules 300mg  PO daily at bedtime Monitor labs, progress, dilantin level at steady state  Jamian Andujo, [place] A 03/11/2017,10:37 AM

## 2017-03-11 NOTE — Progress Notes (Signed)
Subjective: Patient feels better and improving. He had no seizure since admission. He was seen by neurology and keppra is added to his seizure medications. [name] planned for EEG.  Objective: Vital signs in last 24 hours: Temp:  [98.4 F (36.9 C)-98.7 F (37.1 C)] 98.4 F (36.9 C) (04/04 0600) Pulse Rate:  [79] 79 (04/04 0600) Resp:  [18] 18 (04/04 0600) BP: (124-130)/(43-47) 130/43 (04/04 0600) SpO2:  [97 %-100 %] 98 % (04/04 0805) Weight:  [109.4 kg (241 lb 3.2 oz)] 109.4 kg (241 lb 3.2 oz) (04/04 0605) Weight change: -2.631 kg (-5 lb 12.8 oz) Last BM Date: 03/08/17  Intake/Output from previous day: 04/03 0701 - 04/04 0700 In: 480 [P.O.:480] Out: 1050 [Urine:1050]  PHYSICAL EXAM General appearance: alert and moderately obese Resp: diminished breath sounds bilaterally and rhonchi bilaterally Cardio: S1, S2 normal GI: soft, non-tender; bowel sounds normal; no masses,  no organomegaly Extremities: extremities normal, atraumatic, no cyanosis or edema  Lab Results:  Results for orders placed or performed during the hospital encounter of 03/09/17 (from the past 48 hour(s))  CBG monitoring, ED     Status: Abnormal   Collection Time: 03/09/17 12:44 PM  Result Value Ref Range   Glucose-Capillary 124 (H) 65 - 99 mg/dL  CBC with Differential     Status: Abnormal   Collection Time: 03/09/17  1:35 PM  Result Value Ref Range   WBC 6.1 4.0 - 10.5 K/uL   RBC 3.11 (L) 4.22 - 5.81 MIL/uL   Hemoglobin 9.3 (L) 13.0 - 17.0 g/dL   HCT 28.6 (L) 39.0 - 52.0 %   MCV 92.0 78.0 - 100.0 fL   MCH 29.9 26.0 - 34.0 pg   MCHC 32.5 30.0 - 36.0 g/dL   RDW 16.3 (H) 11.5 - 15.5 %   Platelets 127 (L) 150 - 400 K/uL   Neutrophils Relative % 66 %   Neutro Abs 4.1 1.7 - 7.7 K/uL   Lymphocytes Relative 10 %   Lymphs Abs 0.6 (L) 0.7 - 4.0 K/uL   Monocytes Relative 11 %   Monocytes Absolute 0.7 0.1 - 1.0 K/uL   Eosinophils Relative 12 %   Eosinophils Absolute 0.7 0.0 - 0.7 K/uL   Basophils Relative 1 %    Basophils Absolute 0.0 0.0 - 0.1 K/uL  Comprehensive metabolic panel     Status: Abnormal   Collection Time: 03/09/17  1:35 PM  Result Value Ref Range   Sodium 132 (L) 135 - 145 mmol/L   Potassium 3.6 3.5 - 5.1 mmol/L   Chloride 98 (L) 101 - 111 mmol/L   CO2 26 22 - 32 mmol/L   Glucose, Bld 110 (H) 65 - 99 mg/dL   BUN 70 (H) 6 - 20 mg/dL   Creatinine, Ser 2.67 (H) 0.61 - 1.24 mg/dL   Calcium 8.4 (L) 8.9 - 10.3 mg/dL   Total Protein 6.7 6.5 - 8.1 g/dL   Albumin 3.3 (L) 3.5 - 5.0 g/dL   AST 17 15 - 41 U/L   ALT 17 17 - 63 U/L   Alkaline Phosphatase 43 38 - 126 U/L   Total Bilirubin 0.6 0.3 - 1.2 mg/dL   GFR calc non Af Amer 22 (L) >60 mL/min   GFR calc Af Amer 26 (L) >60 mL/min    Comment: (NOTE) The eGFR has been calculated using the CKD EPI equation. This calculation has not been validated in all clinical situations. eGFR's persistently <60 mL/min signify possible Chronic Kidney Disease.    Anion  gap 8 5 - 15  Phenytoin level, total     Status: None   Collection Time: 03/09/17  1:35 PM  Result Value Ref Range   Phenytoin Lvl 15.6 10.0 - 20.0 ug/mL  Urinalysis, Routine w reflex microscopic     Status: None   Collection Time: 03/09/17  3:18 PM  Result Value Ref Range   Color, Urine YELLOW YELLOW   APPearance CLEAR CLEAR   Specific Gravity, Urine 1.006 1.005 - 1.030   pH 5.0 5.0 - 8.0   Glucose, UA NEGATIVE NEGATIVE mg/dL   Hgb urine dipstick NEGATIVE NEGATIVE   Bilirubin Urine NEGATIVE NEGATIVE   Ketones, ur NEGATIVE NEGATIVE mg/dL   Protein, ur NEGATIVE NEGATIVE mg/dL   Nitrite NEGATIVE NEGATIVE   Leukocytes, UA NEGATIVE NEGATIVE  Glucose, capillary     Status: Abnormal   Collection Time: 03/09/17  9:13 PM  Result Value Ref Range   Glucose-Capillary 124 (H) 65 - 99 mg/dL   Comment 1 Notify RN    Comment 2 Document in Chart   T3, free     Status: Abnormal   Collection Time: 03/10/17  4:56 AM  Result Value Ref Range   T3, Free 1.4 (L) 2.0 - 4.4 pg/mL    Comment:  (NOTE) Performed At: [facility] [address] [place], [place] [id number] Lindon Romp MD PT:[id number]   T4, free     Status: Abnormal   Collection Time: 03/10/17  4:56 AM  Result Value Ref Range   Free T4 0.50 (L) 0.61 - 1.12 ng/dL    Comment: (NOTE) Biotin ingestion may interfere with free T4 tests. If the results are inconsistent with the TSH level, previous test results, or the clinical presentation, then consider biotin interference. If needed, order repeat testing after stopping biotin. Performed at [facility], [place] [address] [place], [place] [address]   Glucose, capillary     Status: None   Collection Time: 03/10/17  8:29 AM  Result Value Ref Range   Glucose-Capillary 96 65 - 99 mg/dL  Glucose, capillary     Status: None   Collection Time: 03/10/17 11:54 AM  Result Value Ref Range   Glucose-Capillary 84 65 - 99 mg/dL  Glucose, capillary     Status: Abnormal   Collection Time: 03/10/17  5:42 PM  Result Value Ref Range   Glucose-Capillary 212 (H) 65 - 99 mg/dL   Comment 1 Notify RN    Comment 2 Document in Chart   Glucose, capillary     Status: None   Collection Time: 03/10/17  9:27 PM  Result Value Ref Range   Glucose-Capillary 82 65 - 99 mg/dL   Comment 1 Notify RN    Comment 2 Document in Chart   Basic metabolic panel     Status: Abnormal   Collection Time: 03/11/17  5:02 AM  Result Value Ref Range   Sodium 135 135 - 145 mmol/L   Potassium 3.6 3.5 - 5.1 mmol/L   Chloride 99 (L) 101 - 111 mmol/L   CO2 27 22 - 32 mmol/L   Glucose, Bld 122 (H) 65 - 99 mg/dL   BUN 71 (H) 6 - 20 mg/dL   Creatinine, Ser 2.15 (H) 0.61 - 1.24 mg/dL   Calcium 8.5 (L) 8.9 - 10.3 mg/dL   GFR calc non Af Amer 29 (L) >60 mL/min   GFR calc Af Amer 34 (L) >60 mL/min    Comment: (NOTE) The eGFR has been calculated using the CKD  EPI equation. This calculation has not been validated in all clinical situations. eGFR's persistently <60 mL/min signify possible Chronic  Kidney Disease.    Anion gap 9 5 - 15  Glucose, capillary     Status: Abnormal   Collection Time: 03/11/17  7:29 AM  Result Value Ref Range   Glucose-Capillary 124 (H) 65 - 99 mg/dL   Comment 1 Notify RN    Comment 2 Document in Chart     ABGS No results for input(s): PHART, PO2ART, TCO2, HCO3 in the last 72 hours.  Invalid input(s): PCO2 CULTURES No results found for this or any previous visit (from the past 240 hour(s)). Studies/Results: Ct Head Wo Contrast  Result Date: 03/09/2017 CLINICAL DATA:  Seizure about 2 minutes today, history of seizures EXAM: CT HEAD WITHOUT CONTRAST TECHNIQUE: Contiguous axial images were obtained from the base of the skull through the vertex without intravenous contrast. COMPARISON:  02/10/2017 FINDINGS: Brain: No intracranial hemorrhage, mass effect or midline shift. Stable cerebral atrophy. Stable encephalomalacia in left frontal lobe. Again noted lobulated lesion sella region suspicious for pituitary mass stable. No definite acute cortical infarction. Vascular: Atherosclerotic calcifications of carotid siphon again noted. Atherosclerotic calcifications of left vertebral artery Skull: Again noted prior craniotomy and fixation plate frontal region. Sinuses/Orbits: No acute findings Other: None IMPRESSION: No acute intracranial abnormality. Stable atrophy and chronic white matter disease. No definite acute cortical infarction. Lobulated lesion in sellar region is stable. Again noted encephalomalacia in left frontal lobe stable. Electronically Signed   By: Lahoma Crocker M.D.   On: 03/09/2017 14:25   Dg Chest Port 1 View  Result Date: 03/09/2017 CLINICAL DATA:  Seizure activity, former smoker. EXAM: PORTABLE CHEST 1 VIEW COMPARISON:  Chest x-ray of February 16, 2017 FINDINGS: The lungs are reasonably well inflated. The interstitial markings remain increased. The pulmonary vascularity remains prominent. The cardiac silhouette remains enlarged. The retrocardiac region on  the left is more dense today. No definite pleural effusion is observed. The observed bony thorax is unremarkable. IMPRESSION: CHF with mild pulmonary interstitial edema more conspicuous than on the previous study. There is no alveolar pneumonia. Electronically Signed   By: David  Martinique M.D.   On: 03/09/2017 13:34    Medications: I have reviewed the patient's current medications.  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

## 2017-03-11 NOTE — Progress Notes (Signed)
Pt was too large to fit in MRI machine and refused to transport to [facility] to have MRI done. Dr. Joneen Caraway notified.

## 2017-03-11 NOTE — Procedures (Signed)
Philippi A. Merlene Laughter, MD     [email or web address]           HISTORY: The patient is a 71 year old man who presents with seizures.  MEDICATIONS: Scheduled Meds: . calcitRIOL  0.5 mcg Oral Daily  . desmopressin  0.2 mg Oral BID  . enoxaparin (LOVENOX) injection  40 mg Subcutaneous Q24H  . furosemide  40 mg Intravenous Daily  . insulin aspart  0-5 Units Subcutaneous QHS  . insulin aspart  0-9 Units Subcutaneous TID WC  . ipratropium-albuterol  3 mL Nebulization Q6H  . levETIRAcetam  250 mg Oral BID  . levothyroxine  125 mcg Oral QAC breakfast  . phenytoin  300 mg Oral QHS  . polyvinyl alcohol  1 drop Both Eyes BID   Continuous Infusions: PRN Meds:.HYDROcodone-acetaminophen, LORazepam  Prior to Admission medications   Medication Sig Start Date End Date Taking? Authorizing Provider  acetaminophen (TYLENOL) 325 MG tablet Take 650 mg by mouth every 6 (six) hours as needed.   Yes Historical Provider, MD  amLODipine (NORVASC) 10 MG tablet Take 1 tablet (10 mg total) by mouth daily. 01/01/17  Yes Cassandria Anger, MD  calcitRIOL (ROCALTROL) 0.5 MCG capsule Take 0.5 mcg by mouth daily.   Yes Historical Provider, MD  Cholecalciferol (VITAMIN D3) 2000 units capsule Take 2,000 Units by mouth daily.   Yes Historical Provider, MD  desmopressin (DDAVP) 0.2 MG tablet Take 1 tablet (0.2 mg total) by mouth 2 (two) times daily. 09/30/16  Yes Cassandria Anger, MD  dexamethasone (DECADRON) 1.5 MG tablet Take 1.5 mg by mouth daily.   Yes Historical Provider, MD  HYDROcodone-acetaminophen (NORCO) 10-325 MG tablet Take 1 tablet by mouth every 4 (four) hours as needed for moderate pain. 02/18/17  Yes [name], MD  ipratropium-albuterol (DUONEB) 0.5-2.5 (3) MG/3ML SOLN Take 3 mLs by nebulization 2 (two) times daily. And additional every four hours as needed   Yes Historical Provider, MD  isosorbide-hydrALAZINE (BIDIL) 20-37.5 MG tablet Take 1 tablet by mouth 3 (three) times  daily. 06/17/16  Yes [name], MD  levothyroxine (SYNTHROID, LEVOTHROID) 125 MCG tablet Take 125 mcg by mouth daily before breakfast.   Yes Historical Provider, MD  OXYGEN 02 @@2  L pm via nasal cannula every shift   Yes Historical Provider, MD  phenytoin (DILANTIN) 100 MG ER capsule Take 100 mg by mouth 3 (three) times daily.   Yes Historical Provider, MD  polyvinyl alcohol (LIQUIFILM TEARS) 1.4 % ophthalmic solution Place 1 drop into both eyes 2 (two) times daily.   Yes Historical Provider, MD  testosterone cypionate (DEPOTESTOTERONE CYPIONATE) 100 MG/ML injection For IM use only. Give 1 ml once a day on the 19 of the month   Yes Historical Provider, MD  torsemide (DEMADEX) 20 MG tablet Take 40 mg by mouth daily.   Yes Historical Provider, MD      ANALYSIS: A 16 channel recording using standard 10 20 measurements is conducted for 21 minutes. There is a posterior dominant rhythm of 8 Hz which attenuates with eye opening. There is beta activity observed in the frontal areas. Awake and drowsy activities are observed. Vertex sharp wave are observed. Photic stimulation is not conducted. Hyperventilation is conducted but no abnormal responses are observed. No focal or lateralized slowing is observed. No epileptiform activity is observed.   IMPRESSION: This recording shows mild global slowing for age. There are no epileptiform activities observed.      Abcde Oneil A. Merlene Laughter, M.D.  Diplomate, Tax adviser of  Psychiatry and Neurology ( Neurology).

## 2017-03-11 NOTE — Progress Notes (Signed)
EEG completed, results pending. 

## 2017-03-12 LAB — GLUCOSE, CAPILLARY
GLUCOSE-CAPILLARY: 93 mg/dL (ref 65–99)
Glucose-Capillary: 111 mg/dL — ABNORMAL HIGH (ref 65–99)

## 2017-03-12 MED ORDER — LEVETIRACETAM 250 MG PO TABS: 250.0000 mg | ORAL_TABLET | Freq: Two times a day (BID) | ORAL | 3 refills | Status: AC

## 2017-03-12 NOTE — Progress Notes (Signed)
Pt's IV catheter removed and intact. Pt's IV site clean dry and intact. Discharge instructions including medications and follow up appointments were reviewed and discussed with patient. Pt verbalized understanding of discharge instructions. All questions were answered and no further questions at this time. Pt in stable condition and in no acute distress at time of discharge. Pt will be escorted by nurse tech.  

## 2017-03-12 NOTE — Care Management Important Message (Signed)
Important Message  Patient Details  Name: [name] [name] MRN: [id number] Date of Birth: 1946/09/24   Medicare Important Message Given:  Yes    Sherald Barge, RN 03/12/2017, 9:29 AM

## 2017-03-12 NOTE — Care Management Note (Signed)
Case Management Note  Patient Details  Name: NAZARIO RUSSOM MRN: [id number] Date of Birth: [birth year]   Expected Discharge Date:  03/12/17               Expected Discharge Plan:  [place]  In-House Referral:  NA  Discharge planning Services  CM Consult  Post Acute Care Choice:  Home Health, Resumption of Svcs/PTA Provider Choice offered to:  Patient  HH Arranged:  RN, PT [facility] Agency:  [place]  Status of Service:  Completed, signed off  Additional Comments: Pt discharging home today with resumption of his [facility] services. Pt aware HH has 48hrs to make resumption visit. Pt reports his sister and daughter are caring for him, [facility] nursing disagree's. Pt previously asking if he needs to be checking his CBG's at home because they are being checked here in hospital. Pt is no longer on DM meds and was previously told by Dr. Dorris Fetch he did not need to check CBG's. Pt instructed he does not need to check his blood sugars at home.   Sherald Barge, RN 03/12/2017, 9:34 AM

## 2017-03-12 NOTE — Discharge Summary (Signed)
Physician Discharge Summary  Patient ID: [name] [name] MRN: [id number] DOB/AGE: 16-Dec-1945 71 y.o. 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   amLODipine 10 MG tablet Commonly known as:  NORVASC Take 1 tablet (10 mg total) by mouth daily.   calcitRIOL 0.5 MCG capsule Commonly known as:  ROCALTROL Take 0.5 mcg by mouth daily.   desmopressin 0.2 MG tablet Commonly known as:  DDAVP Take 1 tablet (0.2 mg total) by mouth 2 (two) times daily.   dexamethasone 1.5 MG tablet Commonly known as:  DECADRON Take 1.5 mg by mouth daily.   HYDROcodone-acetaminophen 10-325 MG tablet Commonly known as:  NORCO Take 1 tablet by mouth every 4 (four) hours as needed for moderate pain.   ipratropium-albuterol 0.5-2.5 (3) MG/3ML Soln Commonly known as:  DUONEB Take 3 mLs by nebulization 2 (two) times daily. And additional every four hours as needed   isosorbide-hydrALAZINE 20-37.5 MG tablet Commonly known as:  BIDIL Take 1 tablet by mouth 3 (three) times daily.   levETIRAcetam 250 MG tablet Commonly known as:  KEPPRA Take 1 tablet (250 mg total) by mouth 2 (two) times daily.   levothyroxine 125 MCG tablet Commonly known as:  SYNTHROID, LEVOTHROID Take 125 mcg by mouth daily before breakfast.   OXYGEN 02 @@2  L pm via nasal cannula every shift   phenytoin 100 MG ER capsule Commonly known as:  DILANTIN Take 100 mg by mouth 3 (three) times daily.   polyvinyl alcohol 1.4 % ophthalmic solution Commonly known as:  LIQUIFILM TEARS Place 1 drop into both eyes 2 (two) times daily.   testosterone cypionate 100 MG/ML  injection Commonly known as:  DEPOTESTOTERONE CYPIONATE For IM use only. Give 1 ml once a day on the 19 of the month   torsemide 20 MG tablet Commonly known as:  DEMADEX Take 40 mg by mouth daily.   Vitamin D3 2000 units capsule Take 2,000 Units by mouth daily.       Discharged Condition:  Improved     Consults: Neurology  Significant Diagnostic Studies: Ct Head Wo Contrast  Result Date: 03/09/2017 CLINICAL DATA:  Seizure about 2 minutes today, history of seizures EXAM: CT HEAD WITHOUT CONTRAST TECHNIQUE: Contiguous axial images were obtained from the base of the skull through the vertex without intravenous contrast. COMPARISON:  02/10/2017 FINDINGS: Brain: No intracranial hemorrhage, mass effect or midline shift. Stable cerebral atrophy. Stable encephalomalacia in left frontal lobe. Again noted lobulated lesion sella region suspicious for pituitary mass stable. No definite acute cortical infarction. Vascular: Atherosclerotic calcifications of carotid siphon again noted. Atherosclerotic calcifications of left vertebral artery Skull: Again noted prior craniotomy and fixation plate frontal region. Sinuses/Orbits: No acute findings Other: None IMPRESSION: No acute intracranial abnormality. Stable atrophy and chronic white matter disease. No definite acute cortical infarction. Lobulated lesion in sellar region is stable. Again noted encephalomalacia in left frontal lobe stable. Electronically Signed   By: Lahoma Crocker M.D.   On: 03/09/2017 14:25   Ct Head Wo Contrast  Result Date: 02/10/2017 CLINICAL DATA:  Pain in the extremity, decreased sensation to left hand  and forearm per epic notes EXAM: CT HEAD WITHOUT CONTRAST TECHNIQUE: Contiguous axial images were obtained from the base of the skull through the vertex without intravenous contrast. COMPARISON:  08/03/2016, 12/08/2014, 05/20/2010, MRI 05/27/2010 FINDINGS: Brain: No acute territorial infarction or hemorrhage visualized. Large area of  encephalomalacia involving the left frontal lobe, unchanged. Cortical atrophy. Stable slightly enlarged ventricles. Lobulated soft tissue density within the sella measuring 2 x 2.3 x 2.8 cm, has shown slow enlargement compare to more remote exams. Mass probably involves the right greater than left cavernous sinus. Vascular: No hyperdense vessels.  Carotid artery calcifications. Skull: No fracture. Prior craniotomy changes of the frontal bone. No suspicious lesion. Sinuses/Orbits: Mild mucosal thickening in the ethmoid and sphenoid sinuses. No acute orbital abnormality. Other: None IMPRESSION: 1. No definite CT evidence for acute intracranial abnormality 2. 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 Mass probably involves the right cavernous sinus. It appears more prominent today compared to more remote imaging of the brain. Follow-up MRI is suggested. 3. Old left frontal lobe encephalomalacia with overlying postsurgical changes of the calvarium. Electronically Signed   By: Donavan Foil M.D.   On: 02/10/2017 15:27   Dg Chest Port 1 View  Result Date: 03/09/2017 CLINICAL DATA:  Seizure activity, former smoker. EXAM: PORTABLE CHEST 1 VIEW COMPARISON:  Chest x-ray of February 16, 2017 FINDINGS: The lungs are reasonably well inflated. The interstitial markings remain increased. The pulmonary vascularity remains prominent. The cardiac silhouette remains enlarged. The retrocardiac region on the left is more dense today. No definite pleural effusion is observed. The observed bony thorax is unremarkable. IMPRESSION: CHF with mild pulmonary interstitial edema more conspicuous than on the previous study. There is no alveolar pneumonia. Electronically Signed   By: David  Martinique M.D.   On: 03/09/2017 13:34   Dg Chest Port 1 View  Result Date: 02/16/2017 CLINICAL DATA:  Onset of wheezing and shortness of breath yesterday, productive cough, no chest pain. History of COPD, tobacco abuse, pan  hypopituitarism, aortic regurgitation, diabetes EXAM: PORTABLE CHEST 1 VIEW COMPARISON:  Portable chest x-ray of February 11, 2015 FINDINGS: The lungs are well-expanded. There is no focal infiltrate. The cardiac silhouette remains enlarged. The pulmonary vascularity is mildly engorged and there is mild cephalization of the vascular pattern. There is no pleural effusion. The trachea is midline. There is calcification in the wall of the aortic arch. IMPRESSION: Low-grade CHF superimposed upon COPD.  There is no acute pneumonia. Thoracic aortic atherosclerosis. Electronically Signed   By: David  Martinique M.D.   On: 02/16/2017 07:13   Dg Chest Port 1 View  Result Date: 02/10/2017 CLINICAL DATA:  71 y/o M; chest pain and left upper extremity pain. History of COPD, congestive heart failure, diabetes, and chronic kidney disease. EXAM: PORTABLE CHEST 1 VIEW COMPARISON:  01/26/2017 chest radiograph. FINDINGS: Stable severe cardiomegaly. Prominent interstitial markings. No focal consolidation. No pleural effusion or pneumothorax is identified. Bones are unremarkable. IMPRESSION: Stable severe cardiomegaly. Chronic interstitial markings probably representing mild interstitial pulmonary edema. Electronically Signed   By: Kristine Garbe M.D.   On: 02/10/2017 14:17    Lab Results: Basic Metabolic Panel:  Recent Labs  03/09/17 1335 03/11/17 0502  NA 132* 135  K 3.6 3.6  CL 98* 99*  CO2 26 27  GLUCOSE 110* 122*  BUN 70* 71*  CREATININE 2.67* 2.15*  CALCIUM 8.4* 8.5*   Liver Function Tests:  Recent Labs  03/09/17 1335  AST 17  ALT 17  ALKPHOS 43  BILITOT 0.6  PROT 6.7  ALBUMIN 3.3*     CBC:  Recent Labs  03/09/17 1335  WBC 6.1  NEUTROABS 4.1  HGB 9.3*  HCT 28.6*  MCV 92.0  PLT 127*    No results found for this or any previous visit (from the past 240 hour(s)).   Hospital Course:   This is a  71 years old male with history of multiple medical illnesses was admitted due to  breakthrough seizure and shortness of breath. He was seen by neurology and EEG was done. Keppra was added to his seizure medications. Patient also treated with IV diuretics and nebulizer treatment. His shortness of breath improved and had no more seizure since admission. Patient was discharged in stable condition. He will be followed in a week time.   Discharge Exam: Blood pressure (!) 126/48, pulse 79, temperature 98.4 F (36.9 C), resp. rate 18, height 6\' 1"  (1.854 m), weight 110.1 kg (242 lb 11.2 oz), SpO2 94 %.   Disposition:  home    Follow-up Information    Caera Enwright, MD Follow up in 1 week(s).   Specialty:  Internal Medicine Contact information: [place] [address] [phone]           Signed: [name]   03/12/2017, 8:17 AM

## 2017-03-13 NOTE — Care Management (Signed)
CM contacted after pt DC and told by [facility] rep, they will not be taking pt back. Referral sent to [facility] who states they are able to accept pt and will be able to make first visit beginning of next week. Pt contacted and made aware.

## 2017-03-18 DIAGNOSIS — N184 Chronic kidney disease, stage 4 (severe): Secondary | ICD-10-CM | POA: Diagnosis not present

## 2017-03-18 DIAGNOSIS — I5043 Acute on chronic combined systolic (congestive) and diastolic (congestive) heart failure: Secondary | ICD-10-CM | POA: Diagnosis not present

## 2017-03-18 DIAGNOSIS — J441 Chronic obstructive pulmonary disease with (acute) exacerbation: Secondary | ICD-10-CM | POA: Diagnosis not present

## 2017-03-18 DIAGNOSIS — I130 Hypertensive heart and chronic kidney disease with heart failure and stage 1 through stage 4 chronic kidney disease, or unspecified chronic kidney disease: Secondary | ICD-10-CM | POA: Diagnosis not present

## 2017-03-18 DIAGNOSIS — D631 Anemia in chronic kidney disease: Secondary | ICD-10-CM | POA: Diagnosis not present

## 2017-03-18 DIAGNOSIS — E1122 Type 2 diabetes mellitus with diabetic chronic kidney disease: Secondary | ICD-10-CM | POA: Diagnosis not present

## 2017-03-19 DIAGNOSIS — J449 Chronic obstructive pulmonary disease, unspecified: Secondary | ICD-10-CM | POA: Diagnosis not present

## 2017-03-19 DIAGNOSIS — I10 Essential (primary) hypertension: Secondary | ICD-10-CM | POA: Diagnosis not present

## 2017-03-19 DIAGNOSIS — I5022 Chronic systolic (congestive) heart failure: Secondary | ICD-10-CM | POA: Diagnosis not present

## 2017-03-19 DIAGNOSIS — G4089 Other seizures: Secondary | ICD-10-CM | POA: Diagnosis not present

## 2017-03-20 DIAGNOSIS — D631 Anemia in chronic kidney disease: Secondary | ICD-10-CM | POA: Diagnosis not present

## 2017-03-20 DIAGNOSIS — E1122 Type 2 diabetes mellitus with diabetic chronic kidney disease: Secondary | ICD-10-CM | POA: Diagnosis not present

## 2017-03-20 DIAGNOSIS — J441 Chronic obstructive pulmonary disease with (acute) exacerbation: Secondary | ICD-10-CM | POA: Diagnosis not present

## 2017-03-20 DIAGNOSIS — I130 Hypertensive heart and chronic kidney disease with heart failure and stage 1 through stage 4 chronic kidney disease, or unspecified chronic kidney disease: Secondary | ICD-10-CM | POA: Diagnosis not present

## 2017-03-20 DIAGNOSIS — N184 Chronic kidney disease, stage 4 (severe): Secondary | ICD-10-CM | POA: Diagnosis not present

## 2017-03-20 DIAGNOSIS — I5043 Acute on chronic combined systolic (congestive) and diastolic (congestive) heart failure: Secondary | ICD-10-CM | POA: Diagnosis not present

## 2017-03-23 ENCOUNTER — Encounter (HOSPITAL_COMMUNITY)
Admission: RE | Admit: 2017-03-23 | Discharge: 2017-03-23 | Disposition: A | Discharge status: Home / Self Care | Payer: Medicare HMO | Source: Skilled Nursing Facility | Attending: Internal Medicine | Admitting: Internal Medicine

## 2017-03-23 DIAGNOSIS — I5042 Chronic combined systolic (congestive) and diastolic (congestive) heart failure: Secondary | ICD-10-CM | POA: Insufficient documentation

## 2017-03-24 DIAGNOSIS — I130 Hypertensive heart and chronic kidney disease with heart failure and stage 1 through stage 4 chronic kidney disease, or unspecified chronic kidney disease: Secondary | ICD-10-CM | POA: Diagnosis not present

## 2017-03-24 DIAGNOSIS — I5043 Acute on chronic combined systolic (congestive) and diastolic (congestive) heart failure: Secondary | ICD-10-CM | POA: Diagnosis not present

## 2017-03-24 DIAGNOSIS — D631 Anemia in chronic kidney disease: Secondary | ICD-10-CM | POA: Diagnosis not present

## 2017-03-24 DIAGNOSIS — J441 Chronic obstructive pulmonary disease with (acute) exacerbation: Secondary | ICD-10-CM | POA: Diagnosis not present

## 2017-03-24 DIAGNOSIS — N184 Chronic kidney disease, stage 4 (severe): Secondary | ICD-10-CM | POA: Diagnosis not present

## 2017-03-24 DIAGNOSIS — E1122 Type 2 diabetes mellitus with diabetic chronic kidney disease: Secondary | ICD-10-CM | POA: Diagnosis not present

## 2017-03-25 DIAGNOSIS — E1122 Type 2 diabetes mellitus with diabetic chronic kidney disease: Secondary | ICD-10-CM | POA: Diagnosis not present

## 2017-03-25 DIAGNOSIS — N184 Chronic kidney disease, stage 4 (severe): Secondary | ICD-10-CM | POA: Diagnosis not present

## 2017-03-25 DIAGNOSIS — I5043 Acute on chronic combined systolic (congestive) and diastolic (congestive) heart failure: Secondary | ICD-10-CM | POA: Diagnosis not present

## 2017-03-25 DIAGNOSIS — D631 Anemia in chronic kidney disease: Secondary | ICD-10-CM | POA: Diagnosis not present

## 2017-03-25 DIAGNOSIS — I130 Hypertensive heart and chronic kidney disease with heart failure and stage 1 through stage 4 chronic kidney disease, or unspecified chronic kidney disease: Secondary | ICD-10-CM | POA: Diagnosis not present

## 2017-03-25 DIAGNOSIS — J441 Chronic obstructive pulmonary disease with (acute) exacerbation: Secondary | ICD-10-CM | POA: Diagnosis not present

## 2017-03-26 DIAGNOSIS — I5043 Acute on chronic combined systolic (congestive) and diastolic (congestive) heart failure: Secondary | ICD-10-CM | POA: Diagnosis not present

## 2017-03-26 DIAGNOSIS — J441 Chronic obstructive pulmonary disease with (acute) exacerbation: Secondary | ICD-10-CM | POA: Diagnosis not present

## 2017-03-26 DIAGNOSIS — N184 Chronic kidney disease, stage 4 (severe): Secondary | ICD-10-CM | POA: Diagnosis not present

## 2017-03-26 DIAGNOSIS — E1122 Type 2 diabetes mellitus with diabetic chronic kidney disease: Secondary | ICD-10-CM | POA: Diagnosis not present

## 2017-03-26 DIAGNOSIS — D631 Anemia in chronic kidney disease: Secondary | ICD-10-CM | POA: Diagnosis not present

## 2017-03-26 DIAGNOSIS — I130 Hypertensive heart and chronic kidney disease with heart failure and stage 1 through stage 4 chronic kidney disease, or unspecified chronic kidney disease: Secondary | ICD-10-CM | POA: Diagnosis not present

## 2017-03-30 DIAGNOSIS — E1122 Type 2 diabetes mellitus with diabetic chronic kidney disease: Secondary | ICD-10-CM | POA: Diagnosis not present

## 2017-03-30 DIAGNOSIS — I5043 Acute on chronic combined systolic (congestive) and diastolic (congestive) heart failure: Secondary | ICD-10-CM | POA: Diagnosis not present

## 2017-03-30 DIAGNOSIS — I130 Hypertensive heart and chronic kidney disease with heart failure and stage 1 through stage 4 chronic kidney disease, or unspecified chronic kidney disease: Secondary | ICD-10-CM | POA: Diagnosis not present

## 2017-03-30 DIAGNOSIS — J441 Chronic obstructive pulmonary disease with (acute) exacerbation: Secondary | ICD-10-CM | POA: Diagnosis not present

## 2017-03-30 DIAGNOSIS — D631 Anemia in chronic kidney disease: Secondary | ICD-10-CM | POA: Diagnosis not present

## 2017-03-30 DIAGNOSIS — N184 Chronic kidney disease, stage 4 (severe): Secondary | ICD-10-CM | POA: Diagnosis not present

## 2017-04-06 DIAGNOSIS — M6281 Muscle weakness (generalized): Secondary | ICD-10-CM | POA: Diagnosis not present

## 2017-04-06 DIAGNOSIS — I5040 Unspecified combined systolic (congestive) and diastolic (congestive) heart failure: Secondary | ICD-10-CM | POA: Diagnosis not present

## 2017-04-06 DIAGNOSIS — J449 Chronic obstructive pulmonary disease, unspecified: Secondary | ICD-10-CM | POA: Diagnosis not present

## 2017-04-06 DIAGNOSIS — I5042 Chronic combined systolic (congestive) and diastolic (congestive) heart failure: Secondary | ICD-10-CM | POA: Diagnosis not present

## 2017-04-07 DIAGNOSIS — J441 Chronic obstructive pulmonary disease with (acute) exacerbation: Secondary | ICD-10-CM | POA: Diagnosis not present

## 2017-04-07 DIAGNOSIS — N184 Chronic kidney disease, stage 4 (severe): Secondary | ICD-10-CM | POA: Diagnosis not present

## 2017-04-07 DIAGNOSIS — I5043 Acute on chronic combined systolic (congestive) and diastolic (congestive) heart failure: Secondary | ICD-10-CM | POA: Diagnosis not present

## 2017-04-07 DIAGNOSIS — I130 Hypertensive heart and chronic kidney disease with heart failure and stage 1 through stage 4 chronic kidney disease, or unspecified chronic kidney disease: Secondary | ICD-10-CM | POA: Diagnosis not present

## 2017-04-07 DIAGNOSIS — E1122 Type 2 diabetes mellitus with diabetic chronic kidney disease: Secondary | ICD-10-CM | POA: Diagnosis not present

## 2017-04-07 DIAGNOSIS — D631 Anemia in chronic kidney disease: Secondary | ICD-10-CM | POA: Diagnosis not present

## 2017-04-08 ENCOUNTER — Encounter (HOSPITAL_COMMUNITY)
Admission: RE | Admit: 2017-04-08 | Discharge: 2017-04-08 | Disposition: A | Discharge status: Home / Self Care | Payer: Medicare HMO | Source: Skilled Nursing Facility | Attending: Internal Medicine | Admitting: Internal Medicine

## 2017-04-08 DIAGNOSIS — R279 Unspecified lack of coordination: Secondary | ICD-10-CM | POA: Insufficient documentation

## 2017-04-08 DIAGNOSIS — Z905 Acquired absence of kidney: Secondary | ICD-10-CM | POA: Insufficient documentation

## 2017-04-08 DIAGNOSIS — J9610 Chronic respiratory failure, unspecified whether with hypoxia or hypercapnia: Secondary | ICD-10-CM | POA: Insufficient documentation

## 2017-04-08 DIAGNOSIS — J449 Chronic obstructive pulmonary disease, unspecified: Secondary | ICD-10-CM | POA: Insufficient documentation

## 2017-04-10 DIAGNOSIS — N184 Chronic kidney disease, stage 4 (severe): Secondary | ICD-10-CM | POA: Diagnosis not present

## 2017-04-10 DIAGNOSIS — I130 Hypertensive heart and chronic kidney disease with heart failure and stage 1 through stage 4 chronic kidney disease, or unspecified chronic kidney disease: Secondary | ICD-10-CM | POA: Diagnosis not present

## 2017-04-10 DIAGNOSIS — I5043 Acute on chronic combined systolic (congestive) and diastolic (congestive) heart failure: Secondary | ICD-10-CM | POA: Diagnosis not present

## 2017-04-10 DIAGNOSIS — D631 Anemia in chronic kidney disease: Secondary | ICD-10-CM | POA: Diagnosis not present

## 2017-04-10 DIAGNOSIS — J441 Chronic obstructive pulmonary disease with (acute) exacerbation: Secondary | ICD-10-CM | POA: Diagnosis not present

## 2017-04-10 DIAGNOSIS — E1122 Type 2 diabetes mellitus with diabetic chronic kidney disease: Secondary | ICD-10-CM | POA: Diagnosis not present

## 2017-04-12 ENCOUNTER — Emergency Department (HOSPITAL_COMMUNITY): Payer: Medicare HMO

## 2017-04-12 ENCOUNTER — Encounter (HOSPITAL_COMMUNITY): Payer: Self-pay | Admitting: Emergency Medicine

## 2017-04-12 ENCOUNTER — Inpatient Hospital Stay (HOSPITAL_COMMUNITY)
Admission: EM | Admit: 2017-04-12 | Discharge: 2017-04-16 | DRG: 699 | Disposition: A | Discharge status: Home / Self Care | Payer: Medicare HMO | Attending: Internal Medicine | Admitting: Internal Medicine

## 2017-04-12 DIAGNOSIS — M1A0610 Idiopathic chronic gout, right knee, without tophus (tophi): Secondary | ICD-10-CM | POA: Diagnosis present

## 2017-04-12 DIAGNOSIS — I10 Essential (primary) hypertension: Secondary | ICD-10-CM

## 2017-04-12 DIAGNOSIS — D631 Anemia in chronic kidney disease: Secondary | ICD-10-CM | POA: Diagnosis not present

## 2017-04-12 DIAGNOSIS — M10331 Gout due to renal impairment, right wrist: Secondary | ICD-10-CM | POA: Diagnosis not present

## 2017-04-12 DIAGNOSIS — E039 Hypothyroidism, unspecified: Secondary | ICD-10-CM | POA: Diagnosis present

## 2017-04-12 DIAGNOSIS — Z87891 Personal history of nicotine dependence: Secondary | ICD-10-CM | POA: Diagnosis not present

## 2017-04-12 DIAGNOSIS — I5022 Chronic systolic (congestive) heart failure: Secondary | ICD-10-CM | POA: Diagnosis not present

## 2017-04-12 DIAGNOSIS — M1030 Gout due to renal impairment, unspecified site: Secondary | ICD-10-CM | POA: Diagnosis not present

## 2017-04-12 DIAGNOSIS — I130 Hypertensive heart and chronic kidney disease with heart failure and stage 1 through stage 4 chronic kidney disease, or unspecified chronic kidney disease: Secondary | ICD-10-CM | POA: Diagnosis not present

## 2017-04-12 DIAGNOSIS — J441 Chronic obstructive pulmonary disease with (acute) exacerbation: Secondary | ICD-10-CM | POA: Diagnosis not present

## 2017-04-12 DIAGNOSIS — Z9981 Dependence on supplemental oxygen: Secondary | ICD-10-CM

## 2017-04-12 DIAGNOSIS — N2581 Secondary hyperparathyroidism of renal origin: Secondary | ICD-10-CM | POA: Diagnosis present

## 2017-04-12 DIAGNOSIS — N184 Chronic kidney disease, stage 4 (severe): Secondary | ICD-10-CM

## 2017-04-12 DIAGNOSIS — G40909 Epilepsy, unspecified, not intractable, without status epilepticus: Secondary | ICD-10-CM | POA: Diagnosis present

## 2017-04-12 DIAGNOSIS — E1122 Type 2 diabetes mellitus with diabetic chronic kidney disease: Secondary | ICD-10-CM | POA: Diagnosis present

## 2017-04-12 DIAGNOSIS — I5043 Acute on chronic combined systolic (congestive) and diastolic (congestive) heart failure: Secondary | ICD-10-CM | POA: Diagnosis not present

## 2017-04-12 DIAGNOSIS — I5042 Chronic combined systolic (congestive) and diastolic (congestive) heart failure: Secondary | ICD-10-CM | POA: Diagnosis not present

## 2017-04-12 DIAGNOSIS — L03113 Cellulitis of right upper limb: Secondary | ICD-10-CM | POA: Diagnosis present

## 2017-04-12 DIAGNOSIS — N170 Acute kidney failure with tubular necrosis: Secondary | ICD-10-CM | POA: Diagnosis not present

## 2017-04-12 DIAGNOSIS — Z79899 Other long term (current) drug therapy: Secondary | ICD-10-CM

## 2017-04-12 DIAGNOSIS — E785 Hyperlipidemia, unspecified: Secondary | ICD-10-CM | POA: Diagnosis present

## 2017-04-12 DIAGNOSIS — D649 Anemia, unspecified: Secondary | ICD-10-CM | POA: Diagnosis present

## 2017-04-12 DIAGNOSIS — G9389 Other specified disorders of brain: Secondary | ICD-10-CM | POA: Diagnosis present

## 2017-04-12 DIAGNOSIS — M25561 Pain in right knee: Secondary | ICD-10-CM | POA: Diagnosis not present

## 2017-04-12 DIAGNOSIS — I272 Pulmonary hypertension, unspecified: Secondary | ICD-10-CM | POA: Diagnosis present

## 2017-04-12 DIAGNOSIS — G4730 Sleep apnea, unspecified: Secondary | ICD-10-CM | POA: Diagnosis present

## 2017-04-12 DIAGNOSIS — M25461 Effusion, right knee: Secondary | ICD-10-CM | POA: Diagnosis not present

## 2017-04-12 DIAGNOSIS — N179 Acute kidney failure, unspecified: Secondary | ICD-10-CM

## 2017-04-12 DIAGNOSIS — E876 Hypokalemia: Secondary | ICD-10-CM | POA: Diagnosis not present

## 2017-04-12 DIAGNOSIS — T502X5A Adverse effect of carbonic-anhydrase inhibitors, benzothiadiazides and other diuretics, initial encounter: Secondary | ICD-10-CM | POA: Diagnosis present

## 2017-04-12 DIAGNOSIS — R569 Unspecified convulsions: Secondary | ICD-10-CM | POA: Diagnosis not present

## 2017-04-12 DIAGNOSIS — J449 Chronic obstructive pulmonary disease, unspecified: Secondary | ICD-10-CM | POA: Diagnosis present

## 2017-04-12 DIAGNOSIS — M7989 Other specified soft tissue disorders: Secondary | ICD-10-CM | POA: Diagnosis not present

## 2017-04-12 DIAGNOSIS — N19 Unspecified kidney failure: Secondary | ICD-10-CM

## 2017-04-12 DIAGNOSIS — I351 Nonrheumatic aortic (valve) insufficiency: Secondary | ICD-10-CM | POA: Diagnosis present

## 2017-04-12 LAB — SYNOVIAL CELL COUNT + DIFF, W/ CRYSTALS
Eosinophils-Synovial: 1 % (ref 0–1)
LYMPHOCYTES-SYNOVIAL FLD: 1 % (ref 0–20)
Monocyte-Macrophage-Synovial Fluid: 7 % — ABNORMAL LOW (ref 50–90)
NEUTROPHIL, SYNOVIAL: 91 % — AB (ref 0–25)
WBC, Synovial: 33270 /mm3 — ABNORMAL HIGH (ref 0–200)

## 2017-04-12 LAB — COMPREHENSIVE METABOLIC PANEL
ALBUMIN: 4.2 g/dL (ref 3.5–5.0)
ALK PHOS: 34 U/L — AB (ref 38–126)
ALT: 15 U/L — AB (ref 17–63)
AST: 15 U/L (ref 15–41)
Anion gap: 15 (ref 5–15)
BILIRUBIN TOTAL: 0.7 mg/dL (ref 0.3–1.2)
BUN: 144 mg/dL — AB (ref 6–20)
CALCIUM: 9.7 mg/dL (ref 8.9–10.3)
CO2: 32 mmol/L (ref 22–32)
CREATININE: 3.66 mg/dL — AB (ref 0.61–1.24)
Chloride: 91 mmol/L — ABNORMAL LOW (ref 101–111)
GFR calc Af Amer: 18 mL/min — ABNORMAL LOW (ref >60)
GFR calc non Af Amer: 15 mL/min — ABNORMAL LOW (ref >60)
GLUCOSE: 119 mg/dL — AB (ref 65–99)
POTASSIUM: 2.9 mmol/L — AB (ref 3.5–5.1)
Sodium: 138 mmol/L (ref 135–145)
TOTAL PROTEIN: 8.5 g/dL — AB (ref 6.5–8.1)

## 2017-04-12 LAB — URINALYSIS, ROUTINE W REFLEX MICROSCOPIC
BILIRUBIN URINE: NEGATIVE
Bacteria, UA: NONE SEEN
Glucose, UA: NEGATIVE mg/dL
Ketones, ur: NEGATIVE mg/dL
Leukocytes, UA: NEGATIVE
Nitrite: NEGATIVE
Protein, ur: NEGATIVE mg/dL
Specific Gravity, Urine: 1.012 (ref 1.005–1.030)
pH: 5 (ref 5.0–8.0)

## 2017-04-12 LAB — CBC WITH DIFFERENTIAL/PLATELET
BASOS ABS: 0.1 10*3/uL (ref 0.0–0.1)
BASOS PCT: 1 %
Eosinophils Absolute: 1 10*3/uL — ABNORMAL HIGH (ref 0.0–0.7)
Eosinophils Relative: 9 %
HEMATOCRIT: 33.4 % — AB (ref 39.0–52.0)
HEMOGLOBIN: 11.1 g/dL — AB (ref 13.0–17.0)
LYMPHS PCT: 9 %
Lymphs Abs: 1 10*3/uL (ref 0.7–4.0)
MCH: 30.7 pg (ref 26.0–34.0)
MCHC: 33.2 g/dL (ref 30.0–36.0)
MCV: 92.3 fL (ref 78.0–100.0)
Monocytes Absolute: 1 10*3/uL (ref 0.1–1.0)
Monocytes Relative: 9 %
NEUTROS ABS: 7.9 10*3/uL — AB (ref 1.7–7.7)
NEUTROS PCT: 72 %
Platelets: 150 10*3/uL (ref 150–400)
RBC: 3.62 MIL/uL — ABNORMAL LOW (ref 4.22–5.81)
RDW: 15.8 % — ABNORMAL HIGH (ref 11.5–15.5)
WBC: 11 10*3/uL — ABNORMAL HIGH (ref 4.0–10.5)

## 2017-04-12 LAB — PROTIME-INR
INR: 1.24
Prothrombin Time: 15.7 seconds — ABNORMAL HIGH (ref 11.4–15.2)

## 2017-04-12 LAB — GRAM STAIN: Gram Stain: NONE SEEN

## 2017-04-12 MED ORDER — METHYLPREDNISOLONE SODIUM SUCC 125 MG IJ SOLR
60.0000 mg | Freq: Two times a day (BID) | INTRAMUSCULAR | Status: DC
  Administered 2017-04-12 – 2017-04-15 (×6): 60 mg via INTRAVENOUS
  Filled 2017-04-12 (×6): qty 2

## 2017-04-12 MED ORDER — ACETAMINOPHEN 325 MG PO TABS
650.0000 mg | ORAL_TABLET | Freq: Four times a day (QID) | ORAL | Status: DC | PRN
  Administered 2017-04-12 – 2017-04-14 (×3): 650 mg via ORAL

## 2017-04-12 MED ORDER — ACETAMINOPHEN 325 MG PO TABS
650.0000 mg | ORAL_TABLET | Freq: Four times a day (QID) | ORAL | Status: DC | PRN
  Filled 2017-04-12 (×3): qty 2

## 2017-04-12 MED ORDER — AMLODIPINE BESYLATE 5 MG PO TABS
10.0000 mg | ORAL_TABLET | Freq: Every day | ORAL | Status: DC
  Administered 2017-04-13 – 2017-04-16 (×4): 10 mg via ORAL
  Filled 2017-04-12 (×4): qty 2

## 2017-04-12 MED ORDER — MORPHINE SULFATE (PF) 4 MG/ML IV SOLN
4.0000 mg | Freq: Once | INTRAVENOUS | Status: AC
  Administered 2017-04-12: 4 mg via INTRAVENOUS
  Filled 2017-04-12: qty 1

## 2017-04-12 MED ORDER — SODIUM CHLORIDE 0.9 % IV SOLN
INTRAVENOUS | Status: DC
  Administered 2017-04-12: 11:00:00 via INTRAVENOUS

## 2017-04-12 MED ORDER — DESMOPRESSIN ACETATE 0.2 MG PO TABS
ORAL_TABLET | ORAL | Status: AC
  Filled 2017-04-12: qty 1

## 2017-04-12 MED ORDER — PHENYTOIN SODIUM EXTENDED 100 MG PO CAPS
100.0000 mg | ORAL_CAPSULE | Freq: Three times a day (TID) | ORAL | Status: DC
  Administered 2017-04-12 – 2017-04-16 (×12): 100 mg via ORAL
  Filled 2017-04-12 (×12): qty 1

## 2017-04-12 MED ORDER — POLYVINYL ALCOHOL 1.4 % OP SOLN
1.0000 [drp] | Freq: Two times a day (BID) | OPHTHALMIC | Status: DC
  Administered 2017-04-12 – 2017-04-16 (×7): 1 [drp] via OPHTHALMIC
  Filled 2017-04-12: qty 15

## 2017-04-12 MED ORDER — ACETAMINOPHEN 650 MG RE SUPP: 650.0000 mg | Freq: Four times a day (QID) | RECTAL | Status: DC | PRN

## 2017-04-12 MED ORDER — DEXAMETHASONE 0.5 MG PO TABS
1.5000 mg | ORAL_TABLET | Freq: Every day | ORAL | Status: DC
  Administered 2017-04-13 – 2017-04-16 (×4): 1.5 mg via ORAL
  Filled 2017-04-12: qty 1
  Filled 2017-04-12 (×4): qty 3

## 2017-04-12 MED ORDER — LEVOTHYROXINE SODIUM 25 MCG PO TABS
125.0000 ug | ORAL_TABLET | Freq: Every day | ORAL | Status: DC
  Administered 2017-04-13 – 2017-04-16 (×4): 125 ug via ORAL
  Filled 2017-04-12 (×4): qty 1

## 2017-04-12 MED ORDER — LEVETIRACETAM 250 MG PO TABS
250.0000 mg | ORAL_TABLET | Freq: Two times a day (BID) | ORAL | Status: DC
  Administered 2017-04-12 – 2017-04-16 (×8): 250 mg via ORAL
  Filled 2017-04-12 (×8): qty 1

## 2017-04-12 MED ORDER — IPRATROPIUM-ALBUTEROL 0.5-2.5 (3) MG/3ML IN SOLN
3.0000 mL | Freq: Two times a day (BID) | RESPIRATORY_TRACT | Status: DC
  Administered 2017-04-12 – 2017-04-16 (×8): 3 mL via RESPIRATORY_TRACT
  Filled 2017-04-12 (×8): qty 3

## 2017-04-12 MED ORDER — SODIUM CHLORIDE 0.9 % IV SOLN
INTRAVENOUS | Status: DC
  Administered 2017-04-12 – 2017-04-14 (×5): via INTRAVENOUS

## 2017-04-12 MED ORDER — TORSEMIDE 20 MG PO TABS: 40.0000 mg | ORAL_TABLET | Freq: Every day | ORAL | Status: DC

## 2017-04-12 MED ORDER — ISOSORB DINITRATE-HYDRALAZINE 20-37.5 MG PO TABS
ORAL_TABLET | ORAL | Status: AC
  Filled 2017-04-12: qty 2

## 2017-04-12 MED ORDER — DESMOPRESSIN ACETATE 0.2 MG PO TABS
0.2000 mg | ORAL_TABLET | Freq: Two times a day (BID) | ORAL | Status: DC
  Administered 2017-04-12 – 2017-04-16 (×8): 0.2 mg via ORAL
  Filled 2017-04-12 (×10): qty 1

## 2017-04-12 MED ORDER — DEXTROSE 5 % IV SOLN
1.0000 g | Freq: Once | INTRAVENOUS | Status: AC
  Administered 2017-04-12: 1 g via INTRAVENOUS
  Filled 2017-04-12: qty 10

## 2017-04-12 MED ORDER — ONDANSETRON HCL 4 MG/2ML IJ SOLN: 4.0000 mg | Freq: Four times a day (QID) | INTRAMUSCULAR | Status: DC | PRN

## 2017-04-12 MED ORDER — LIDOCAINE HCL (PF) 2 % IJ SOLN
INTRAMUSCULAR | Status: AC
  Administered 2017-04-12: 10 mL
  Filled 2017-04-12: qty 20

## 2017-04-12 MED ORDER — CALCITRIOL 0.25 MCG PO CAPS
0.5000 ug | ORAL_CAPSULE | Freq: Every day | ORAL | Status: DC
  Administered 2017-04-13 – 2017-04-16 (×4): 0.5 ug via ORAL
  Filled 2017-04-12 (×4): qty 2

## 2017-04-12 MED ORDER — POVIDONE-IODINE 10 % EX SOLN
CUTANEOUS | Status: AC
  Administered 2017-04-12: 08:00:00
  Filled 2017-04-12: qty 118

## 2017-04-12 MED ORDER — POTASSIUM CHLORIDE CRYS ER 20 MEQ PO TBCR
40.0000 meq | EXTENDED_RELEASE_TABLET | Freq: Once | ORAL | Status: AC
  Administered 2017-04-12: 40 meq via ORAL
  Filled 2017-04-12 (×2): qty 2

## 2017-04-12 MED ORDER — ENOXAPARIN SODIUM 30 MG/0.3ML ~~LOC~~ SOLN
30.0000 mg | SUBCUTANEOUS | Status: DC
  Administered 2017-04-12: 30 mg via SUBCUTANEOUS
  Filled 2017-04-12 (×2): qty 0.3

## 2017-04-12 MED ORDER — ISOSORB DINITRATE-HYDRALAZINE 20-37.5 MG PO TABS
1.0000 | ORAL_TABLET | Freq: Three times a day (TID) | ORAL | Status: DC
  Administered 2017-04-12 – 2017-04-16 (×12): 1 via ORAL
  Filled 2017-04-12 (×15): qty 1

## 2017-04-12 MED ORDER — ONDANSETRON HCL 4 MG PO TABS: 4.0000 mg | ORAL_TABLET | Freq: Four times a day (QID) | ORAL | Status: DC | PRN
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
## 2017-04-12 NOTE — Progress Notes (Signed)
Dr. Jerilee Hoh notified via text page of patient's arrival on unit.

## 2017-04-12 NOTE — ED Triage Notes (Signed)
Pt having swelling and pain of right wrist and right knee.  Has history of cellulitis
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
## 2017-04-13 LAB — PATHOLOGIST SMEAR REVIEW

## 2017-04-13 NOTE — Progress Notes (Signed)
Subjective: Patient was admitted due to confusion and right wrist & right knee swelling. There is worsening of his renal function. Patient is started on IV fluid and IV antibiotics.  Objective: Vital signs in last 24 hours: Temp:  [99.1 F (37.3 C)-100.2 F (37.9 C)] 99.2 F (37.3 C) (05/07 0538) Pulse Rate:  [88-109] 92 (05/07 0538) Resp:  [16-18] 18 (05/07 0538) BP: (132-185)/(40-61) 132/47 (05/07 0538) SpO2:  [91 %-100 %] 92 % (05/07 0722) Weight change:  Last BM Date: 04/10/17  Intake/Output from previous day: 05/06 0701 - 05/07 0700 In: 830.3 [P.O.:24; I.V.:806.3] Out: 300 [Urine:300]  PHYSICAL EXAM General appearance: fatigued and slowed mentation Resp: diminished breath sounds bilaterally and rhonchi bilaterally Cardio: S1, S2 normal GI: soft, non-tender; bowel sounds normal; no masses,  no organomegaly Extremities: tender and swollen right wrist and right knee  Lab Results:  Results for orders placed or performed during the hospital encounter of 04/12/17 (from the past 48 hour(s))  CBC with Differential/Platelet     Status: Abnormal   Collection Time: 04/12/17  7:51 AM  Result Value Ref Range   WBC 11.0 (H) 4.0 - 10.5 K/uL   RBC 3.62 (L) 4.22 - 5.81 MIL/uL   Hemoglobin 11.1 (L) 13.0 - 17.0 g/dL   HCT 33.4 (L) 39.0 - 52.0 %   MCV 92.3 78.0 - 100.0 fL   MCH 30.7 26.0 - 34.0 pg   MCHC 33.2 30.0 - 36.0 g/dL   RDW 15.8 (H) 11.5 - 15.5 %   Platelets 150 150 - 400 K/uL   Neutrophils Relative % 72 %   Neutro Abs 7.9 (H) 1.7 - 7.7 K/uL   Lymphocytes Relative 9 %   Lymphs Abs 1.0 0.7 - 4.0 K/uL   Monocytes Relative 9 %   Monocytes Absolute 1.0 0.1 - 1.0 K/uL   Eosinophils Relative 9 %   Eosinophils Absolute 1.0 (H) 0.0 - 0.7 K/uL   Basophils Relative 1 %   Basophils Absolute 0.1 0.0 - 0.1 K/uL  Comprehensive metabolic panel     Status: Abnormal   Collection Time: 04/12/17  7:51 AM  Result Value Ref Range   Sodium 138 135 - 145 mmol/L   Potassium 2.9 (L) 3.5 -  5.1 mmol/L   Chloride 91 (L) 101 - 111 mmol/L   CO2 32 22 - 32 mmol/L   Glucose, Bld 119 (H) 65 - 99 mg/dL   BUN 144 (H) 6 - 20 mg/dL    Comment: RESULTS CONFIRMED BY MANUAL DILUTION   Creatinine, Ser 3.66 (H) 0.61 - 1.24 mg/dL   Calcium 9.7 8.9 - 10.3 mg/dL   Total Protein 8.5 (H) 6.5 - 8.1 g/dL   Albumin 4.2 3.5 - 5.0 g/dL   AST 15 15 - 41 U/L   ALT 15 (L) 17 - 63 U/L   Alkaline Phosphatase 34 (L) 38 - 126 U/L   Total Bilirubin 0.7 0.3 - 1.2 mg/dL   GFR calc non Af Amer 15 (L) >60 mL/min   GFR calc Af Amer 18 (L) >60 mL/min    Comment: (NOTE) The eGFR has been calculated using the CKD EPI equation. This calculation has not been validated in all clinical situations. eGFR's persistently <60 mL/min signify possible Chronic Kidney Disease.    Anion gap 15 5 - 15  Protime-INR     Status: Abnormal   Collection Time: 04/12/17  7:51 AM  Result Value Ref Range   Prothrombin Time 15.7 (H) 11.4 - 15.2 seconds   INR  1.24   Synovial cell count + diff, w/ crystals     Status: Abnormal   Collection Time: 04/12/17  8:13 AM  Result Value Ref Range   Color, Synovial STRAW (A) YELLOW   Appearance-Synovial HAZY (A) CLEAR   Crystals, Fluid INTRACELLULAR MONOSODIUM URATE CRYSTALS    WBC, Synovial 33,270 (H) 0 - 200 /cu mm   Neutrophil, Synovial 91 (H) 0 - 25 %   Lymphocytes-Synovial Fld 1 0 - 20 %   Monocyte-Macrophage-Synovial Fluid 7 (L) 50 - 90 %   Eosinophils-Synovial 1 0 - 1 %  Gram stain     Status: None   Collection Time: 04/12/17  8:13 AM  Result Value Ref Range   Specimen Description KNEE    Special Requests RIGHT    Gram Stain      NO ORGANISMS SEEN ABUNDANT WBC PRESENT, PREDOMINANTLY PMN    Report Status 04/12/2017 FINAL   Urinalysis, Routine w reflex microscopic     Status: Abnormal   Collection Time: 04/12/17  9:15 AM  Result Value Ref Range   Color, Urine YELLOW YELLOW   APPearance CLEAR CLEAR   Specific Gravity, Urine 1.012 1.005 - 1.030   pH 5.0 5.0 - 8.0    Glucose, UA NEGATIVE NEGATIVE mg/dL   Hgb urine dipstick SMALL (A) NEGATIVE   Bilirubin Urine NEGATIVE NEGATIVE   Ketones, ur NEGATIVE NEGATIVE mg/dL   Protein, ur NEGATIVE NEGATIVE mg/dL   Nitrite NEGATIVE NEGATIVE   Leukocytes, UA NEGATIVE NEGATIVE   RBC / HPF 0-5 0 - 5 RBC/hpf   WBC, UA 0-5 0 - 5 WBC/hpf   Bacteria, UA NONE SEEN NONE SEEN   Squamous Epithelial / LPF 0-5 (A) NONE SEEN    ABGS No results for input(s): PHART, PO2ART, TCO2, HCO3 in the last 72 hours.  Invalid input(s): PCO2 CULTURES Recent Results (from the past 240 hour(s))  Gram stain     Status: None   Collection Time: 04/12/17  8:13 AM  Result Value Ref Range Status   Specimen Description KNEE  Final   Special Requests RIGHT  Final   Gram Stain   Final    NO ORGANISMS SEEN ABUNDANT WBC PRESENT, PREDOMINANTLY PMN    Report Status 04/12/2017 FINAL  Final   Studies/Results: Dg Knee 2 Views Right  Result Date: 04/12/2017 CLINICAL DATA:  71 year old male with right knee pain and swelling, unable to stand. EXAM: RIGHT KNEE - 1-2 VIEW COMPARISON:  11/03/2016 FINDINGS: Questionable small joint effusion on the cross-table lateral view, regressed from the prior study in November. There are chronic erosive changes along the undersurface of the patella, and these seem progressed in the central patella compared to the prior study. Medial and lateral compartment joint spaces appear normal. Femur and tib-fib bone mineralization is within normal limits. No other periarticular erosions. No fracture dislocation. IMPRESSION: 1. 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 Probable small joint effusion, but regressed since the November radiographs. 2. 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 Recommend Knee MRI without and with contrast. 3. No acute fracture. Medial and lateral joint compartments appear to remain normal. Electronically Signed   By: Genevie Ann M.D.   On: 04/12/2017 08:55    Medications: I have reviewed the patient's current medications.  

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
## 2017-04-14 DIAGNOSIS — M1A0610 Idiopathic chronic gout, right knee, without tophus (tophi): Secondary | ICD-10-CM

## 2017-04-14 LAB — RENAL FUNCTION PANEL
ALBUMIN: 3 g/dL — AB (ref 3.5–5.0)
ANION GAP: 11 (ref 5–15)
BUN: 114 mg/dL — ABNORMAL HIGH (ref 6–20)
CALCIUM: 8.6 mg/dL — AB (ref 8.9–10.3)
CO2: 26 mmol/L (ref 22–32)
Chloride: 99 mmol/L — ABNORMAL LOW (ref 101–111)
Creatinine, Ser: 2.96 mg/dL — ABNORMAL HIGH (ref 0.61–1.24)
GFR, EST AFRICAN AMERICAN: 23 mL/min — AB (ref >60)
GFR, EST NON AFRICAN AMERICAN: 20 mL/min — AB (ref >60)
GLUCOSE: 150 mg/dL — AB (ref 65–99)
PHOSPHORUS: 3.6 mg/dL (ref 2.5–4.6)
POTASSIUM: 3.1 mmol/L — AB (ref 3.5–5.1)
SODIUM: 136 mmol/L (ref 135–145)

## 2017-04-14 LAB — CBC
HCT: 26.8 % — ABNORMAL LOW (ref 39.0–52.0)
HEMOGLOBIN: 9.2 g/dL — AB (ref 13.0–17.0)
MCH: 31.6 pg (ref 26.0–34.0)
MCHC: 34.3 g/dL (ref 30.0–36.0)
MCV: 92.1 fL (ref 78.0–100.0)
Platelets: 141 10*3/uL — ABNORMAL LOW (ref 150–400)
RBC: 2.91 MIL/uL — AB (ref 4.22–5.81)
RDW: 15.2 % (ref 11.5–15.5)
WBC: 7.7 10*3/uL (ref 4.0–10.5)

## 2017-04-14 LAB — URIC ACID: Uric Acid, Serum: 13.7 mg/dL — ABNORMAL HIGH (ref 4.4–7.6)

## 2017-04-14 MED ORDER — POTASSIUM CHLORIDE CRYS ER 20 MEQ PO TBCR
30.0000 meq | EXTENDED_RELEASE_TABLET | Freq: Two times a day (BID) | ORAL | Status: DC
  Administered 2017-04-14 – 2017-04-15 (×4): 30 meq via ORAL
  Filled 2017-04-14 (×5): qty 1
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
## 2017-04-14 NOTE — Consult Note (Signed)
   [facility] Inpatient Consult   04/14/2017  DAVEY LIMAS 16-Aug-1946 [id number]   Electronic record reveals patient eligible for [facility] services with several admissions in 6 months and diagnosis of CHF and COPD. Met with the patient regarding the benefits of [facility] services. Explained that [facility] is a covered benefit of patient's [employer]. Reviewed information for [facility] and a brochure was provided with contact information.  Explained that [facility] does not interfere with or replace any services arranged by the inpatient care management staff.  Patient declined services with [facility] at this time. For questions or concerns please contact:  Narek Kniss RN, [facility] Liaison  [phone]) Business Mobile [phone]) Toll free office

## 2017-04-14 NOTE — Progress Notes (Signed)
Subjective: Patient feels better today. His knee and wrist joints are less painful. His renal function is improving. [name] more alert and awake. [name] being evaluated by orthopedics. Objective: Vital signs in last 24 hours: Temp:  [98.9 F (37.2 C)-99.2 F (37.3 C)] 98.9 F (37.2 C) (05/08 0625) Pulse Rate:  [90-91] 91 (05/08 0625) Resp:  [20] 20 (05/08 0625) BP: (119-131)/(47-48) 131/48 (05/08 0625) SpO2:  [96 %-98 %] 98 % (05/08 0730) Weight change:  Last BM Date: 04/13/17  Intake/Output from previous day: 05/07 0701 - 05/08 0700 In: 360 [P.O.:360] Out: 800 [Urine:800]  PHYSICAL EXAM General appearance: fatigued and slowed mentation Resp: diminished breath sounds bilaterally and rhonchi bilaterally Cardio: S1, S2 normal GI: soft, non-tender; bowel sounds normal; no masses,  no organomegaly Extremities: tender and swollen right wrist and right knee  Lab Results:  Results for orders placed or performed during the hospital encounter of 04/12/17 (from the past 48 hour(s))  Synovial cell count + diff, w/ crystals     Status: Abnormal   Collection Time: 04/12/17  8:13 AM  Result Value Ref Range   Color, Synovial STRAW (A) YELLOW   Appearance-Synovial HAZY (A) CLEAR   Crystals, Fluid INTRACELLULAR MONOSODIUM URATE CRYSTALS    WBC, Synovial 33,270 (H) 0 - 200 /cu mm   Neutrophil, Synovial 91 (H) 0 - 25 %   Lymphocytes-Synovial Fld 1 0 - 20 %   Monocyte-Macrophage-Synovial Fluid 7 (L) 50 - 90 %   Eosinophils-Synovial 1 0 - 1 %  Gram stain     Status: None   Collection Time: 04/12/17  8:13 AM  Result Value Ref Range   Specimen Description KNEE    Special Requests RIGHT    Gram Stain      NO ORGANISMS SEEN ABUNDANT WBC PRESENT, PREDOMINANTLY PMN    Report Status 04/12/2017 FINAL   Culture, body fluid-bottle     Status: None (Preliminary result)   Collection Time: 04/12/17  8:13 AM  Result Value Ref Range   Specimen Description KNEE    Special Requests NONE    Culture NO  GROWTH < 24 HOURS    Report Status PENDING   Pathologist smear review     Status: None   Collection Time: 04/12/17  8:13 AM  Result Value Ref Range   Path Review Reviewed by Kalman Drape, M.D.     Comment: 05.07.18 ACUTE INFLAMMATION. Performed at [facility], [place] [address], [place], [place] [address]   Urinalysis, Routine w reflex microscopic     Status: Abnormal   Collection Time: 04/12/17  9:15 AM  Result Value Ref Range   Color, Urine YELLOW YELLOW   APPearance CLEAR CLEAR   Specific Gravity, Urine 1.012 1.005 - 1.030   pH 5.0 5.0 - 8.0   Glucose, UA NEGATIVE NEGATIVE mg/dL   Hgb urine dipstick SMALL (A) NEGATIVE   Bilirubin Urine NEGATIVE NEGATIVE   Ketones, ur NEGATIVE NEGATIVE mg/dL   Protein, ur NEGATIVE NEGATIVE mg/dL   Nitrite NEGATIVE NEGATIVE   Leukocytes, UA NEGATIVE NEGATIVE   RBC / HPF 0-5 0 - 5 RBC/hpf   WBC, UA 0-5 0 - 5 WBC/hpf   Bacteria, UA NONE SEEN NONE SEEN   Squamous Epithelial / LPF 0-5 (A) NONE SEEN  CBC     Status: Abnormal   Collection Time: 04/14/17  5:46 AM  Result Value Ref Range   WBC 7.7 4.0 - 10.5 K/uL   RBC 2.91 (L) 4.22 - 5.81  MIL/uL   Hemoglobin 9.2 (L) 13.0 - 17.0 g/dL   HCT 26.8 (L) 39.0 - 52.0 %   MCV 92.1 78.0 - 100.0 fL   MCH 31.6 26.0 - 34.0 pg   MCHC 34.3 30.0 - 36.0 g/dL   RDW 15.2 11.5 - 15.5 %   Platelets 141 (L) 150 - 400 K/uL  Uric acid     Status: Abnormal   Collection Time: 04/14/17  5:46 AM  Result Value Ref Range   Uric Acid, Serum 13.7 (H) 4.4 - 7.6 mg/dL  Renal function panel     Status: Abnormal   Collection Time: 04/14/17  5:49 AM  Result Value Ref Range   Sodium 136 135 - 145 mmol/L   Potassium 3.1 (L) 3.5 - 5.1 mmol/L   Chloride 99 (L) 101 - 111 mmol/L   CO2 26 22 - 32 mmol/L   Glucose, Bld 150 (H) 65 - 99 mg/dL   BUN 114 (H) 6 - 20 mg/dL    Comment: RESULTS CONFIRMED BY MANUAL DILUTION   Creatinine, Ser 2.96 (H) 0.61 - 1.24 mg/dL   Calcium 8.6 (L) 8.9 - 10.3 mg/dL   Phosphorus  3.6 2.5 - 4.6 mg/dL   Albumin 3.0 (L) 3.5 - 5.0 g/dL   GFR calc non Af Amer 20 (L) >60 mL/min   GFR calc Af Amer 23 (L) >60 mL/min    Comment: (NOTE) The eGFR has been calculated using the CKD EPI equation. This calculation has not been validated in all clinical situations. eGFR's persistently <60 mL/min signify possible Chronic Kidney Disease.    Anion gap 11 5 - 15    ABGS No results for input(s): PHART, PO2ART, TCO2, HCO3 in the last 72 hours.  Invalid input(s): PCO2 CULTURES Recent Results (from the past 240 hour(s))  Gram stain     Status: None   Collection Time: 04/12/17  8:13 AM  Result Value Ref Range Status   Specimen Description KNEE  Final   Special Requests RIGHT  Final   Gram Stain   Final    NO ORGANISMS SEEN ABUNDANT WBC PRESENT, PREDOMINANTLY PMN    Report Status 04/12/2017 FINAL  Final  Culture, body fluid-bottle     Status: None (Preliminary result)   Collection Time: 04/12/17  8:13 AM  Result Value Ref Range Status   Specimen Description KNEE  Final   Special Requests NONE  Final   Culture NO GROWTH < 24 HOURS  Final   Report Status PENDING  Incomplete   Studies/Results: Dg Knee 2 Views Right  Result Date: 04/12/2017 CLINICAL DATA:  71 year old male with right knee pain and swelling, unable to stand. EXAM: RIGHT KNEE - 1-2 VIEW COMPARISON:  11/03/2016 FINDINGS: Questionable small joint effusion on the cross-table lateral view, regressed from the prior study in November. There are chronic erosive changes along the undersurface of the patella, and these seem progressed in the central patella compared to the prior study. Medial and lateral compartment joint spaces appear normal. Femur and tib-fib bone mineralization is within normal limits. No other periarticular erosions. No fracture dislocation. IMPRESSION: 1. 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 Probable small joint effusion, but regressed since the November  radiographs. 2. 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 Recommend Knee MRI without and with contrast. 3. No acute fracture. Medial and lateral joint compartments appear to remain normal. Electronically Signed   By: Genevie Ann M.D.   On: 04/12/2017 08:55  Medications: I have reviewed the patient's current medications.  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 Will monitor BMP    LOS: 2 days   [place] 04/14/2017, 8:08 AM

## 2017-04-14 NOTE — Progress Notes (Signed)
Subjective: Interval History: has no complaint of pain. Presently he is feeling much better. Patient also denies any difficulty breathing..  Objective: Vital signs in last 24 hours: Temp:  [98.9 F (37.2 C)-99.2 F (37.3 C)] 98.9 F (37.2 C) (05/08 0625) Pulse Rate:  [90-91] 91 (05/08 0625) Resp:  [20] 20 (05/08 0625) BP: (119-131)/(47-48) 131/48 (05/08 0625) SpO2:  [96 %-98 %] 98 % (05/08 0730) Weight change:   Intake/Output from previous day: 05/07 0701 - 05/08 0700 In: 360 [P.O.:360] Out: 800 [Urine:800] Intake/Output this shift: No intake/output data recorded.  General appearance: alert, cooperative and no distress Resp: clear to auscultation bilaterally Cardio: regular rate and rhythm ExtremitieNo edema. Right knee swelling has gone and no pain or tender.No edema. Right knee swelling has gone and no pain or tender.  Lab Results:  Recent Labs  04/12/17 0751 04/14/17 0546  WBC 11.0* 7.7  HGB 11.1* 9.2*  HCT 33.4* 26.8*  PLT 150 141*   BMET:  Recent Labs  04/12/17 0751 04/14/17 0549  NA 138 136  K 2.9* 3.1*  CL 91* 99*  CO2 32 26  GLUCOSE 119* 150*  BUN 144* 114*  CREATININE 3.66* 2.96*  CALCIUM 9.7 8.6*   No results for input(s): PTH in the last 72 hours. Iron Studies: No results for input(s): IRON, TIBC, TRANSFERRIN, FERRITIN in the last 72 hours.  Studies/Results: Dg Knee 2 Views Right  Result Date: 04/12/2017 CLINICAL DATA:  71 year old male with right knee pain and swelling, unable to stand. EXAM: RIGHT KNEE - 1-2 VIEW COMPARISON:  11/03/2016 FINDINGS: Questionable small joint effusion on the cross-table lateral view, regressed from the prior study in November. There are chronic erosive changes along the undersurface of the patella, and these seem progressed in the central patella compared to the prior study. Medial and lateral compartment joint spaces appear normal. Femur and tib-fib bone mineralization is within normal limits. No other periarticular  erosions. No fracture dislocation. IMPRESSION: 1. 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 Probable small joint effusion, but regressed since the November radiographs. 2. 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 Recommend Knee MRI without and with contrast. 3. No acute fracture. Medial and lateral joint compartments appear to remain normal. Electronically Signed   By: Genevie Ann M.D.   On: 04/12/2017 08:55    I have reviewed the patient's current medications.  Assessment/Plan: 1] acute kidney injury superimposed on chronic. Thought to be secondary to prerenal syndrome/ATN. Presently his renal function is improving. His non-oliguric. 2] hypokalemia: His potassium is low but improving 3] Bone and metabolic disorder: His calcium and phosphorus is range. Presently diet-controlled. 4] diabetes 5] history of gout: Patient with hyperuricemia. His uric acid is 13.7. 6] history of swelling of his knee. Presently is improving. Culture no growth still now. 7] history of fluid CHF and severe aortic insufficiency: Presently patient is asymptomatic.  Plan: 1] We'll continue with hydration 2] will start patient on KCl 30 mEq by mouth twice a day 3] will check his renal panel in the morning 4] once the acute inflammation subsides. Patient may benefit from treatment of his hyperuricemia by using Uloric or allopurinol   LOS: 2 days   Kacyn Souder S 04/14/2017,7:34 AM

## 2017-04-15 LAB — RENAL FUNCTION PANEL
ALBUMIN: 3 g/dL — AB (ref 3.5–5.0)
ANION GAP: 9 (ref 5–15)
BUN: 95 mg/dL — AB (ref 6–20)
CHLORIDE: 106 mmol/L (ref 101–111)
CO2: 23 mmol/L (ref 22–32)
Calcium: 8.6 mg/dL — ABNORMAL LOW (ref 8.9–10.3)
Creatinine, Ser: 2.66 mg/dL — ABNORMAL HIGH (ref 0.61–1.24)
GFR calc Af Amer: 26 mL/min — ABNORMAL LOW (ref >60)
GFR, EST NON AFRICAN AMERICAN: 22 mL/min — AB (ref >60)
Glucose, Bld: 92 mg/dL (ref 65–99)
PHOSPHORUS: 2.8 mg/dL (ref 2.5–4.6)
POTASSIUM: 4 mmol/L (ref 3.5–5.1)
Sodium: 138 mmol/L (ref 135–145)

## 2017-04-15 MED ORDER — ENOXAPARIN SODIUM 40 MG/0.4ML ~~LOC~~ SOLN
40.0000 mg | SUBCUTANEOUS | Status: DC
  Administered 2017-04-15: 40 mg via SUBCUTANEOUS
  Filled 2017-04-15: qty 0.4

## 2017-04-15 MED ORDER — METHYLPREDNISOLONE SODIUM SUCC 40 MG IJ SOLR
40.0000 mg | Freq: Every day | INTRAMUSCULAR | Status: DC
  Filled 2017-04-15: qty 1

## 2017-04-15 MED ORDER — LORATADINE 10 MG PO TABS
10.0000 mg | ORAL_TABLET | Freq: Every day | ORAL | Status: DC
  Administered 2017-04-15 – 2017-04-16 (×2): 10 mg via ORAL
  Filled 2017-04-15 (×2): qty 1

## 2017-04-15 NOTE — Progress Notes (Signed)
[name] [name]  MRN: [id number]  DOB/AGE: 1946/05/12 71 y.o.  Primary Care Physician:Fanta, Tesfaye, MD  Admit date: 04/12/2017  Chief Complaint:  Chief Complaint  Patient presents with  . Knee Pain  . Wrist Pain    S-Pt presented on  04/12/2017 with  Chief Complaint  Patient presents with  . Knee Pain  . Wrist Pain  .    Pt says he is feeling better.Pt says " they took fluid off my knee"    Meds . amLODipine  10 mg Oral Daily  . calcitRIOL  0.5 mcg Oral Daily  . desmopressin  0.2 mg Oral BID  . dexamethasone  1.5 mg Oral Daily  . enoxaparin (LOVENOX) injection  30 mg Subcutaneous Q24H  . ipratropium-albuterol  3 mL Nebulization BID  . isosorbide-hydrALAZINE  1 tablet Oral TID  . levETIRAcetam  250 mg Oral BID  . levothyroxine  125 mcg Oral QAC breakfast  . methylPREDNISolone (SOLU-MEDROL) injection  60 mg Intravenous Q12H  . phenytoin  100 mg Oral TID  . polyvinyl alcohol  1 drop Both Eyes BID  . potassium chloride  30 mEq Oral BID      Physical Exam: Vital signs in last 24 hours: Temp:  [97.7 F (36.5 C)-98.9 F (37.2 C)] 97.7 F (36.5 C) (05/08 2100) Pulse Rate:  [80-91] 80 (05/08 2100) Resp:  [16-20] 18 (05/08 2100) BP: (121-131)/(48-59) 121/59 (05/08 2100) SpO2:  [97 %-99 %] 99 % (05/08 2100) Weight change:  Last BM Date: 04/13/17  Intake/Output from previous day: 05/08 0701 - 05/09 0700 In: 840 [P.O.:840] Out: -  No intake/output data recorded.   Physical Exam: General- pt is awake,alert, oriented to time place and person Resp- No acute REsp distress, decreased at bases. CVS- S1S2 regular in rate and rhythm, Systolic Ejection Murmur + GIT- BS+, soft, NT, ND EXT- NO LE Edema, NO Cyanosis   Lab Results: CBC  Recent Labs  04/12/17 0751 04/14/17 0546  WBC 11.0* 7.7  HGB 11.1* 9.2*  HCT 33.4* 26.8*  PLT 150 141*    BMET  Recent Labs  04/14/17 0549 04/15/17 0432  NA 136 138  K 3.1* 4.0  CL 99* 106  CO2 26 23  GLUCOSE 150* 92   BUN 114* 95*  CREATININE 2.96* 2.66*  CALCIUM 8.6* 8.6*   Trend Creat 2018   3.66=>2.66 2017  3.38=>2.88          3.9=>2.8( Jan admission ) 2016  2.3--2.9 2015  2.2--3.0 2014  2.0--2.5           ( This admission 2.2--2.4) 2013  2.1--2.5 2012  1.5--1.7 2011  1.8   MICRO Recent Results (from the past 240 hour(s))  Gram stain     Status: None   Collection Time: 04/12/17  8:13 AM  Result Value Ref Range Status   Specimen Description KNEE  Final   Special Requests RIGHT  Final   Gram Stain   Final    NO ORGANISMS SEEN ABUNDANT WBC PRESENT, PREDOMINANTLY PMN    Report Status 04/12/2017 FINAL  Final  Culture, body fluid-bottle     Status: None (Preliminary result)   Collection Time: 04/12/17  8:13 AM  Result Value Ref Range Status   Specimen Description KNEE  Final   Special Requests NONE  Final   Culture NO GROWTH 2 DAYS  Final   Report Status PENDING  Incomplete      Lab Results  Component Value Date   PTH 129 (H) 02/09/2017  CALCIUM 8.6 (L) 04/15/2017   PHOS 2.8 04/15/2017         Impression: 1)Renal  AKI secondary to Prerenal/ ATN  AKI on CKD  CKD stage 4 . CKD since 2011- MOst likley before that CKD secondary to HTN/ Cardiorenal/Multiple AKI Progression of CKD marked with multiple AKI    AKI now better Creat trending down  2)HTN  bp at goal  Medication-  On Diuretics- On Calcium Channel Blockers On vasodilators   3)Anemia HGb trending low Primary team following  4)CKD Mineral-Bone Disorder PTH elevated. Secondary Hyperparathyroidism present. Phosphorus at goal.   5)CVS  CHF  Aortic Regurgitaion Primary  following  6)Electrolytes  Normokalemic  Normonatremic  7)Acid base Co2 at goal  8) Msk-admitted with gout.     Now clinically better   Ortho and  Primary team follwoing   Plan:  Will continue current care.     [place] S 04/15/2017, 5:39 AM

## 2017-04-15 NOTE — Progress Notes (Signed)
Subjective: Patient feels better today.His joint pain and swelling is improving. He was able to take few steps yesterday. Objective: Vital signs in last 24 hours: Temp:  [97.7 F (36.5 C)-98.9 F (37.2 C)] 98.9 F (37.2 C) (05/09 0500) Pulse Rate:  [80-85] 85 (05/09 0500) Resp:  [16-18] 18 (05/09 0500) BP: (121-126)/(48-59) 126/48 (05/09 0500) SpO2:  [96 %-99 %] 96 % (05/09 0800) Weight change:  Last BM Date: 04/13/17  Intake/Output from previous day: 05/08 0701 - 05/09 0700 In: 1740 [P.O.:840; I.V.:900] Out: -   PHYSICAL EXAM General appearance: fatigued and slowed mentation Resp: diminished breath sounds bilaterally and rhonchi bilaterally Cardio: S1, S2 normal GI: soft, non-tender; bowel sounds normal; no masses,  no organomegaly Extremities: tender and swollen right wrist and right knee  Lab Results:  Results for orders placed or performed during the hospital encounter of 04/12/17 (from the past 48 hour(s))  CBC     Status: Abnormal   Collection Time: 04/14/17  5:46 AM  Result Value Ref Range   WBC 7.7 4.0 - 10.5 K/uL   RBC 2.91 (L) 4.22 - 5.81 MIL/uL   Hemoglobin 9.2 (L) 13.0 - 17.0 g/dL   HCT 26.8 (L) 39.0 - 52.0 %   MCV 92.1 78.0 - 100.0 fL   MCH 31.6 26.0 - 34.0 pg   MCHC 34.3 30.0 - 36.0 g/dL   RDW 15.2 11.5 - 15.5 %   Platelets 141 (L) 150 - 400 K/uL  Uric acid     Status: Abnormal   Collection Time: 04/14/17  5:46 AM  Result Value Ref Range   Uric Acid, Serum 13.7 (H) 4.4 - 7.6 mg/dL  Renal function panel     Status: Abnormal   Collection Time: 04/14/17  5:49 AM  Result Value Ref Range   Sodium 136 135 - 145 mmol/L   Potassium 3.1 (L) 3.5 - 5.1 mmol/L   Chloride 99 (L) 101 - 111 mmol/L   CO2 26 22 - 32 mmol/L   Glucose, Bld 150 (H) 65 - 99 mg/dL   BUN 114 (H) 6 - 20 mg/dL    Comment: RESULTS CONFIRMED BY MANUAL DILUTION   Creatinine, Ser 2.96 (H) 0.61 - 1.24 mg/dL   Calcium 8.6 (L) 8.9 - 10.3 mg/dL   Phosphorus 3.6 2.5 - 4.6 mg/dL   Albumin 3.0  (L) 3.5 - 5.0 g/dL   GFR calc non Af Amer 20 (L) >60 mL/min   GFR calc Af Amer 23 (L) >60 mL/min    Comment: (NOTE) The eGFR has been calculated using the CKD EPI equation. This calculation has not been validated in all clinical situations. eGFR's persistently <60 mL/min signify possible Chronic Kidney Disease.    Anion gap 11 5 - 15  Renal function panel     Status: Abnormal   Collection Time: 04/15/17  4:32 AM  Result Value Ref Range   Sodium 138 135 - 145 mmol/L   Potassium 4.0 3.5 - 5.1 mmol/L    Comment: DELTA CHECK NOTED   Chloride 106 101 - 111 mmol/L   CO2 23 22 - 32 mmol/L   Glucose, Bld 92 65 - 99 mg/dL   BUN 95 (H) 6 - 20 mg/dL   Creatinine, Ser 2.66 (H) 0.61 - 1.24 mg/dL   Calcium 8.6 (L) 8.9 - 10.3 mg/dL   Phosphorus 2.8 2.5 - 4.6 mg/dL   Albumin 3.0 (L) 3.5 - 5.0 g/dL   GFR calc non Af Amer 22 (L) >60 mL/min   GFR  calc Af Amer 26 (L) >60 mL/min    Comment: (NOTE) The eGFR has been calculated using the CKD EPI equation. This calculation has not been validated in all clinical situations. eGFR's persistently <60 mL/min signify possible Chronic Kidney Disease.    Anion gap 9 5 - 15    ABGS No results for input(s): PHART, PO2ART, TCO2, HCO3 in the last 72 hours.  Invalid input(s): PCO2 CULTURES Recent Results (from the past 240 hour(s))  Gram stain     Status: None   Collection Time: 04/12/17  8:13 AM  Result Value Ref Range Status   Specimen Description KNEE  Final   Special Requests RIGHT  Final   Gram Stain   Final    NO ORGANISMS SEEN ABUNDANT WBC PRESENT, PREDOMINANTLY PMN    Report Status 04/12/2017 FINAL  Final  Culture, body fluid-bottle     Status: None (Preliminary result)   Collection Time: 04/12/17  8:13 AM  Result Value Ref Range Status   Specimen Description KNEE  Final   Special Requests NONE  Final   Culture NO GROWTH 3 DAYS  Final   Report Status PENDING  Incomplete   Studies/Results: No results found.  Medications: I have  reviewed the patient's current medications.  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 Will monitor BMP    LOS: 3 days   [name] [name] 04/15/2017, 8:24 AM

## 2017-04-16 MED ORDER — COLCHICINE 0.6 MG PO TABS: 0.6000 mg | ORAL_TABLET | Freq: Two times a day (BID) | ORAL | 2 refills | Status: DC

## 2017-04-16 MED ORDER — POTASSIUM CHLORIDE CRYS ER 20 MEQ PO TBCR: 20.0000 meq | EXTENDED_RELEASE_TABLET | Freq: Every day | ORAL | 3 refills | Status: DC

## 2017-04-16 MED ORDER — PREDNISONE 10 MG PO TABS: ORAL_TABLET | ORAL | 0 refills | Status: DC

## 2017-04-16 NOTE — Care Management Note (Signed)
Case Management Note  Patient Details  Name: [name] MRN: [id number] Date of Birth: 1946/05/14  Subjective/Objective:                  Pt admitted from home, he lives alone and has daughters who check on him. He uses walker for ambulation. He wears supplemental oxygen at home pta. He has PCP, transportation and insurance with drug coverage. He was active with [facility] pta. He plans to resume those services at DC. Pt aware HH has 48hrs to make resume services.   Action/Plan: Tim Justis, [facility] rep, aware of referral and will obtain pt info from chart. Pt will have port oxygen tank for transport home No further CM needs communicated.   Expected Discharge Date:  04/16/17               Expected Discharge Plan:  Home/Self Care  In-House Referral:  NA  Discharge planning Services  CM Consult  Post Acute Care Choice:  Home Health, Resumption of Svcs/PTA Provider Choice offered to:  Patient  HH Arranged:  RN, PT, Nurse's Aide [place] Agency:  [facility] ([facility]  Status of Service:  Completed, signed off  Sherald Barge, RN 04/16/2017, 10:03 AM

## 2017-04-16 NOTE — Care Management Important Message (Signed)
Important Message  Patient Details  Name: [name] [name] MRN: [id number] Date of Birth: 07/31/1946   Medicare Important Message Given:  Yes    Sherald Barge, RN 04/16/2017, 10:02 AM

## 2017-04-16 NOTE — Discharge Summary (Signed)
Physician Discharge Summary  Patient ID: [name] [name] MRN: [id number] DOB/AGE: 12/22/1945 71 y.o. Primary Care Physician:Damontre Millea, MD Admit date: 04/12/2017 Discharge date: 04/16/2017    Discharge Diagnoses:   Principal Problem:   Acute gout due to renal impairment involving right wrist Active Problems:   Hyperlipidemia   Seizure disorder (HCC)   Chronic combined systolic and diastolic CHF    Essential hypertension   ARF (acute renal failure) (HCC)   Idiopathic chronic gout of right knee without tophus   Allergies as of 04/16/2017   No Known Allergies     Medication List    TAKE these medications   acetaminophen 325 MG tablet Commonly known as:  TYLENOL Take 650 mg by mouth every 6 (six) hours as needed.   amLODipine 10 MG tablet Commonly known as:  NORVASC Take 1 tablet (10 mg total) by mouth daily.   calcitRIOL 0.5 MCG capsule Commonly known as:  ROCALTROL Take 0.5 mcg by mouth daily.   colchicine 0.6 MG tablet Take 1 tablet (0.6 mg total) by mouth 2 (two) times daily.   desmopressin 0.2 MG tablet Commonly known as:  DDAVP Take 1 tablet (0.2 mg total) by mouth 2 (two) times daily.   dexamethasone 1.5 MG tablet Commonly known as:  DECADRON Take 1.5 mg by mouth daily.   HYDROcodone-acetaminophen 10-325 MG tablet Commonly known as:  NORCO Take 1 tablet by mouth every 4 (four) hours as needed for moderate pain.   ipratropium-albuterol 0.5-2.5 (3) MG/3ML Soln Commonly known as:  DUONEB Take 3 mLs by nebulization 2 (two) times daily. And additional every four hours as needed   isosorbide-hydrALAZINE 20-37.5 MG tablet Commonly known as:  BIDIL Take 1 tablet by mouth 3 (three) times daily.   levETIRAcetam 250 MG tablet Commonly known as:  KEPPRA Take 1 tablet (250 mg total) by mouth 2 (two) times daily.   levothyroxine 125 MCG tablet Commonly known as:  SYNTHROID, LEVOTHROID Take 125 mcg by mouth daily before breakfast.   OXYGEN 02 @@2  L pm via  nasal cannula every shift   phenytoin 100 MG ER capsule Commonly known as:  DILANTIN Take 100 mg by mouth 3 (three) times daily.   polyvinyl alcohol 1.4 % ophthalmic solution Commonly known as:  LIQUIFILM TEARS Place 1 drop into both eyes 2 (two) times daily.   potassium chloride SA 20 MEQ tablet Commonly known as:  K-DUR,KLOR-CON Take 1 tablet (20 mEq total) by mouth daily.   predniSONE 10 MG tablet Commonly known as:  DELTASONE 4 tab po daily for 3 days, 3 tab po daily for 3 days, 2 tab po daily for 3 days, 1 tab po daily for 3 days   testosterone cypionate 100 MG/ML injection Commonly known as:  DEPOTESTOTERONE CYPIONATE For IM use only. Give 1 ml once a day on the 19 of the month   torsemide 20 MG tablet Commonly known as:  DEMADEX Take 40 mg by mouth daily.       Discharged Condition:  improved    Consults: nephrology and orthopedics  Significant Diagnostic Studies: Dg Knee 2 Views Right  Result Date: 04/12/2017 CLINICAL DATA:  71 year old male with right knee pain and swelling, unable to stand. EXAM: RIGHT KNEE - 1-2 VIEW COMPARISON:  11/03/2016 FINDINGS: Questionable small joint effusion on the cross-table lateral view, regressed from the prior study in November. There are chronic erosive changes along the undersurface of the patella, and these seem progressed in the central patella compared to the prior  study. Medial and lateral compartment joint spaces appear normal. Femur and tib-fib bone mineralization is within normal limits. No other periarticular erosions. No fracture dislocation. IMPRESSION: 1. 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 Probable small joint effusion, but regressed since the November radiographs. 2. 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 Recommend Knee MRI without and with contrast. 3. No acute fracture. Medial and lateral joint compartments appear to remain  normal. Electronically Signed   By: Genevie Ann M.D.   On: 04/12/2017 08:55    Lab Results: Basic Metabolic Panel:  Recent Labs  04/14/17 0549 04/15/17 0432  NA 136 138  K 3.1* 4.0  CL 99* 106  CO2 26 23  GLUCOSE 150* 92  BUN 114* 95*  CREATININE 2.96* 2.66*  CALCIUM 8.6* 8.6*  PHOS 3.6 2.8   Liver Function Tests:  Recent Labs  04/14/17 0549 04/15/17 0432  ALBUMIN 3.0* 3.0*     CBC:  Recent Labs  04/14/17 0546  WBC 7.7  HGB 9.2*  HCT 26.8*  MCV 92.1  PLT 141*    Recent Results (from the past 240 hour(s))  Gram stain     Status: None   Collection Time: 04/12/17  8:13 AM  Result Value Ref Range Status   Specimen Description KNEE  Final   Special Requests RIGHT  Final   Gram Stain   Final    NO ORGANISMS SEEN ABUNDANT WBC PRESENT, PREDOMINANTLY PMN    Report Status 04/12/2017 FINAL  Final  Culture, body fluid-bottle     Status: None (Preliminary result)   Collection Time: 04/12/17  8:13 AM  Result Value Ref Range Status   Specimen Description KNEE  Final   Special Requests NONE  Final   Culture NO GROWTH 3 DAYS  Final   Report Status PENDING  Incomplete     Hospital Course:   This is a 71 years old male was admitted due to change in mental status, swelling and pain of right wrist and right knee. Patient was treated with iv steroid and IV fluid. He had very uric acid level and synovial fluid analysis is positive for gout. He was seen by orthopedics and advised to continue medical treatment. Nephrology assisted for his renal failure. He improved and discharged in stable condition.  Discharge Exam: Blood pressure (!) 123/45, pulse 84, temperature 98.3 F (36.8 C), temperature source Oral, resp. rate 18, height 6\' 1"  (1.854 m), weight 109.8 kg (242 lb), SpO2 98 %.    Disposition:  Improved.      Signed: Lanesha Azzaro   04/16/2017, 8:08 AM

## 2017-04-16 NOTE — Progress Notes (Signed)
Subjective: Interval History: Patient is feeling much better. He does have any pain. Patient also denies any difficulty breathing.  Objective: Vital signs in last 24 hours: Temp:  [98.3 F (36.8 C)-98.6 F (37 C)] 98.3 F (36.8 C) (05/10 0527) Pulse Rate:  [79-85] 84 (05/10 0527) Resp:  [18] 18 (05/10 0527) BP: (123-134)/(45-49) 123/45 (05/10 0527) SpO2:  [98 %-99 %] 98 % (05/10 0527) Weight change:   Intake/Output from previous day: 05/09 0701 - 05/10 0700 In: -  Out: 1200 [Urine:1200] Intake/Output this shift: No intake/output data recorded.  Generally patient is alert in no apparent distress Chest is clear to auscultation Heart exam revealed regular rate and rhythm no murmur Extremities no edema. The swelling of his knee has improved. Presently nontender.  Lab Results:  Recent Labs  04/14/17 0546  WBC 7.7  HGB 9.2*  HCT 26.8*  PLT 141*   BMET:   Recent Labs  04/14/17 0549 04/15/17 0432  NA 136 138  K 3.1* 4.0  CL 99* 106  CO2 26 23  GLUCOSE 150* 92  BUN 114* 95*  CREATININE 2.96* 2.66*  CALCIUM 8.6* 8.6*   No results for input(s): PTH in the last 72 hours. Iron Studies: No results for input(s): IRON, TIBC, TRANSFERRIN, FERRITIN in the last 72 hours.  Studies/Results: No results found.  I have reviewed the patient's current medications.  Assessment/Plan: 1] acute kidney injury superimposed on chronic. Thought to be secondary to prerenal syndrome/ATN. His functional function continued to improve. Presently is returning to his baseline. Patient is asymptomatic. 2] hypokalemia: Patient on potassium supplement. His potassium is normal. 3] Bone and metabolic disorder: His calcium and phosphorus is range. Presently diet-controlled. 4] diabetes 5] history of gout: Patient with hyperuricemia. His uric acid is 13.7. 6] history of swelling of his knee. Presently is improving. Culture no growth still now. 7] history of fluid CHF and severe aortic  insufficiency: Presently patient is asymptomatic. Patient is off diuretics.  Plan: 1] We'll see patient in 4 weeks. 2] we'll check his renal panel before coming to the office. 3] we'll DC IV fluid 4] potassium, but continued if he is going to be started on diuretics.   LOS: 4 days   Litzy Dicker S 04/16/2017,8:58 AM

## 2017-04-17 LAB — CULTURE, BODY FLUID-BOTTLE: CULTURE: NO GROWTH

## 2017-04-17 LAB — CULTURE, BODY FLUID W GRAM STAIN -BOTTLE

## 2017-04-22 DIAGNOSIS — E1122 Type 2 diabetes mellitus with diabetic chronic kidney disease: Secondary | ICD-10-CM | POA: Diagnosis not present

## 2017-04-22 DIAGNOSIS — J441 Chronic obstructive pulmonary disease with (acute) exacerbation: Secondary | ICD-10-CM | POA: Diagnosis not present

## 2017-04-22 DIAGNOSIS — I5043 Acute on chronic combined systolic (congestive) and diastolic (congestive) heart failure: Secondary | ICD-10-CM | POA: Diagnosis not present

## 2017-04-22 DIAGNOSIS — D631 Anemia in chronic kidney disease: Secondary | ICD-10-CM | POA: Diagnosis not present

## 2017-04-22 DIAGNOSIS — N184 Chronic kidney disease, stage 4 (severe): Secondary | ICD-10-CM | POA: Diagnosis not present

## 2017-04-22 DIAGNOSIS — I130 Hypertensive heart and chronic kidney disease with heart failure and stage 1 through stage 4 chronic kidney disease, or unspecified chronic kidney disease: Secondary | ICD-10-CM | POA: Diagnosis not present

## 2017-04-27 DIAGNOSIS — J441 Chronic obstructive pulmonary disease with (acute) exacerbation: Secondary | ICD-10-CM | POA: Diagnosis not present

## 2017-04-27 DIAGNOSIS — E1122 Type 2 diabetes mellitus with diabetic chronic kidney disease: Secondary | ICD-10-CM | POA: Diagnosis not present

## 2017-04-27 DIAGNOSIS — N184 Chronic kidney disease, stage 4 (severe): Secondary | ICD-10-CM | POA: Diagnosis not present

## 2017-04-27 DIAGNOSIS — D631 Anemia in chronic kidney disease: Secondary | ICD-10-CM | POA: Diagnosis not present

## 2017-04-27 DIAGNOSIS — I5043 Acute on chronic combined systolic (congestive) and diastolic (congestive) heart failure: Secondary | ICD-10-CM | POA: Diagnosis not present

## 2017-04-27 DIAGNOSIS — I130 Hypertensive heart and chronic kidney disease with heart failure and stage 1 through stage 4 chronic kidney disease, or unspecified chronic kidney disease: Secondary | ICD-10-CM | POA: Diagnosis not present

## 2017-04-30 DIAGNOSIS — I130 Hypertensive heart and chronic kidney disease with heart failure and stage 1 through stage 4 chronic kidney disease, or unspecified chronic kidney disease: Secondary | ICD-10-CM | POA: Diagnosis not present

## 2017-04-30 DIAGNOSIS — E1122 Type 2 diabetes mellitus with diabetic chronic kidney disease: Secondary | ICD-10-CM | POA: Diagnosis not present

## 2017-04-30 DIAGNOSIS — D631 Anemia in chronic kidney disease: Secondary | ICD-10-CM | POA: Diagnosis not present

## 2017-04-30 DIAGNOSIS — N184 Chronic kidney disease, stage 4 (severe): Secondary | ICD-10-CM | POA: Diagnosis not present

## 2017-04-30 DIAGNOSIS — I5043 Acute on chronic combined systolic (congestive) and diastolic (congestive) heart failure: Secondary | ICD-10-CM | POA: Diagnosis not present

## 2017-04-30 DIAGNOSIS — J441 Chronic obstructive pulmonary disease with (acute) exacerbation: Secondary | ICD-10-CM | POA: Diagnosis not present

## 2017-05-05 DIAGNOSIS — J441 Chronic obstructive pulmonary disease with (acute) exacerbation: Secondary | ICD-10-CM | POA: Diagnosis not present

## 2017-05-05 DIAGNOSIS — N184 Chronic kidney disease, stage 4 (severe): Secondary | ICD-10-CM | POA: Diagnosis not present

## 2017-05-05 DIAGNOSIS — I5043 Acute on chronic combined systolic (congestive) and diastolic (congestive) heart failure: Secondary | ICD-10-CM | POA: Diagnosis not present

## 2017-05-05 DIAGNOSIS — I130 Hypertensive heart and chronic kidney disease with heart failure and stage 1 through stage 4 chronic kidney disease, or unspecified chronic kidney disease: Secondary | ICD-10-CM | POA: Diagnosis not present

## 2017-05-05 DIAGNOSIS — E1122 Type 2 diabetes mellitus with diabetic chronic kidney disease: Secondary | ICD-10-CM | POA: Diagnosis not present

## 2017-05-05 DIAGNOSIS — D631 Anemia in chronic kidney disease: Secondary | ICD-10-CM | POA: Diagnosis not present

## 2017-05-07 DIAGNOSIS — E1122 Type 2 diabetes mellitus with diabetic chronic kidney disease: Secondary | ICD-10-CM | POA: Diagnosis not present

## 2017-05-07 DIAGNOSIS — I5043 Acute on chronic combined systolic (congestive) and diastolic (congestive) heart failure: Secondary | ICD-10-CM | POA: Diagnosis not present

## 2017-05-07 DIAGNOSIS — J441 Chronic obstructive pulmonary disease with (acute) exacerbation: Secondary | ICD-10-CM | POA: Diagnosis not present

## 2017-05-07 DIAGNOSIS — N184 Chronic kidney disease, stage 4 (severe): Secondary | ICD-10-CM | POA: Diagnosis not present

## 2017-05-07 DIAGNOSIS — D631 Anemia in chronic kidney disease: Secondary | ICD-10-CM | POA: Diagnosis not present

## 2017-05-07 DIAGNOSIS — I130 Hypertensive heart and chronic kidney disease with heart failure and stage 1 through stage 4 chronic kidney disease, or unspecified chronic kidney disease: Secondary | ICD-10-CM | POA: Diagnosis not present

## 2017-05-08 ENCOUNTER — Encounter (HOSPITAL_COMMUNITY): Payer: Self-pay | Admitting: Emergency Medicine

## 2017-05-08 ENCOUNTER — Inpatient Hospital Stay (HOSPITAL_COMMUNITY)
Admission: EM | Admit: 2017-05-08 | Discharge: 2017-05-11 | DRG: 191 | Disposition: A | Discharge status: Home Health | Payer: Medicare HMO | Attending: Internal Medicine | Admitting: Internal Medicine

## 2017-05-08 ENCOUNTER — Emergency Department (HOSPITAL_COMMUNITY): Payer: Medicare HMO

## 2017-05-08 DIAGNOSIS — I10 Essential (primary) hypertension: Secondary | ICD-10-CM | POA: Diagnosis not present

## 2017-05-08 DIAGNOSIS — G40909 Epilepsy, unspecified, not intractable, without status epilepticus: Secondary | ICD-10-CM

## 2017-05-08 DIAGNOSIS — Y92009 Unspecified place in unspecified non-institutional (private) residence as the place of occurrence of the external cause: Secondary | ICD-10-CM

## 2017-05-08 DIAGNOSIS — Z6831 Body mass index (BMI) 31.0-31.9, adult: Secondary | ICD-10-CM

## 2017-05-08 DIAGNOSIS — R112 Nausea with vomiting, unspecified: Secondary | ICD-10-CM | POA: Diagnosis present

## 2017-05-08 DIAGNOSIS — Z7952 Long term (current) use of systemic steroids: Secondary | ICD-10-CM | POA: Diagnosis not present

## 2017-05-08 DIAGNOSIS — Z9981 Dependence on supplemental oxygen: Secondary | ICD-10-CM

## 2017-05-08 DIAGNOSIS — Z905 Acquired absence of kidney: Secondary | ICD-10-CM

## 2017-05-08 DIAGNOSIS — J441 Chronic obstructive pulmonary disease with (acute) exacerbation: Principal | ICD-10-CM | POA: Diagnosis present

## 2017-05-08 DIAGNOSIS — D631 Anemia in chronic kidney disease: Secondary | ICD-10-CM | POA: Diagnosis not present

## 2017-05-08 DIAGNOSIS — T504X5A Adverse effect of drugs affecting uric acid metabolism, initial encounter: Secondary | ICD-10-CM | POA: Diagnosis present

## 2017-05-08 DIAGNOSIS — J9611 Chronic respiratory failure with hypoxia: Secondary | ICD-10-CM | POA: Diagnosis not present

## 2017-05-08 DIAGNOSIS — E876 Hypokalemia: Secondary | ICD-10-CM | POA: Diagnosis present

## 2017-05-08 DIAGNOSIS — N184 Chronic kidney disease, stage 4 (severe): Secondary | ICD-10-CM | POA: Diagnosis not present

## 2017-05-08 DIAGNOSIS — Z85528 Personal history of other malignant neoplasm of kidney: Secondary | ICD-10-CM

## 2017-05-08 DIAGNOSIS — Z87891 Personal history of nicotine dependence: Secondary | ICD-10-CM

## 2017-05-08 DIAGNOSIS — J9621 Acute and chronic respiratory failure with hypoxia: Secondary | ICD-10-CM | POA: Diagnosis present

## 2017-05-08 DIAGNOSIS — Z8249 Family history of ischemic heart disease and other diseases of the circulatory system: Secondary | ICD-10-CM

## 2017-05-08 DIAGNOSIS — E039 Hypothyroidism, unspecified: Secondary | ICD-10-CM | POA: Diagnosis not present

## 2017-05-08 DIAGNOSIS — K521 Toxic gastroenteritis and colitis: Secondary | ICD-10-CM | POA: Diagnosis present

## 2017-05-08 DIAGNOSIS — I130 Hypertensive heart and chronic kidney disease with heart failure and stage 1 through stage 4 chronic kidney disease, or unspecified chronic kidney disease: Secondary | ICD-10-CM | POA: Diagnosis not present

## 2017-05-08 DIAGNOSIS — E785 Hyperlipidemia, unspecified: Secondary | ICD-10-CM | POA: Diagnosis present

## 2017-05-08 DIAGNOSIS — E1122 Type 2 diabetes mellitus with diabetic chronic kidney disease: Secondary | ICD-10-CM | POA: Diagnosis not present

## 2017-05-08 DIAGNOSIS — J9690 Respiratory failure, unspecified, unspecified whether with hypoxia or hypercapnia: Secondary | ICD-10-CM | POA: Diagnosis not present

## 2017-05-08 DIAGNOSIS — I5022 Chronic systolic (congestive) heart failure: Secondary | ICD-10-CM | POA: Diagnosis not present

## 2017-05-08 DIAGNOSIS — E230 Hypopituitarism: Secondary | ICD-10-CM | POA: Diagnosis present

## 2017-05-08 DIAGNOSIS — I129 Hypertensive chronic kidney disease with stage 1 through stage 4 chronic kidney disease, or unspecified chronic kidney disease: Secondary | ICD-10-CM | POA: Diagnosis not present

## 2017-05-08 DIAGNOSIS — R110 Nausea: Secondary | ICD-10-CM | POA: Diagnosis not present

## 2017-05-08 DIAGNOSIS — N2889 Other specified disorders of kidney and ureter: Secondary | ICD-10-CM | POA: Diagnosis not present

## 2017-05-08 DIAGNOSIS — E038 Other specified hypothyroidism: Secondary | ICD-10-CM | POA: Diagnosis present

## 2017-05-08 DIAGNOSIS — R0602 Shortness of breath: Secondary | ICD-10-CM | POA: Diagnosis not present

## 2017-05-08 DIAGNOSIS — M109 Gout, unspecified: Secondary | ICD-10-CM | POA: Diagnosis present

## 2017-05-08 LAB — URINALYSIS, ROUTINE W REFLEX MICROSCOPIC
BACTERIA UA: NONE SEEN
Bilirubin Urine: NEGATIVE
Glucose, UA: NEGATIVE mg/dL
Ketones, ur: NEGATIVE mg/dL
LEUKOCYTES UA: NEGATIVE
NITRITE: NEGATIVE
PH: 5 (ref 5.0–8.0)
PROTEIN: NEGATIVE mg/dL
Specific Gravity, Urine: 1.01 (ref 1.005–1.030)

## 2017-05-08 LAB — PHENYTOIN LEVEL, TOTAL: Phenytoin Lvl: 14.5 ug/mL (ref 10.0–20.0)

## 2017-05-08 LAB — COMPREHENSIVE METABOLIC PANEL
ALT: 29 U/L (ref 17–63)
ANION GAP: 11 (ref 5–15)
AST: 21 U/L (ref 15–41)
Albumin: 4 g/dL (ref 3.5–5.0)
Alkaline Phosphatase: 38 U/L (ref 38–126)
BUN: 99 mg/dL — ABNORMAL HIGH (ref 6–20)
CHLORIDE: 100 mmol/L — AB (ref 101–111)
CO2: 29 mmol/L (ref 22–32)
Calcium: 9.3 mg/dL (ref 8.9–10.3)
Creatinine, Ser: 2.7 mg/dL — ABNORMAL HIGH (ref 0.61–1.24)
GFR, EST AFRICAN AMERICAN: 25 mL/min — AB (ref >60)
GFR, EST NON AFRICAN AMERICAN: 22 mL/min — AB (ref >60)
Glucose, Bld: 101 mg/dL — ABNORMAL HIGH (ref 65–99)
POTASSIUM: 3 mmol/L — AB (ref 3.5–5.1)
Sodium: 140 mmol/L (ref 135–145)
Total Bilirubin: 0.5 mg/dL (ref 0.3–1.2)
Total Protein: 7.7 g/dL (ref 6.5–8.1)

## 2017-05-08 LAB — CBC WITH DIFFERENTIAL/PLATELET
Basophils Absolute: 0.1 10*3/uL (ref 0.0–0.1)
Basophils Relative: 1 %
EOS ABS: 1.1 10*3/uL — AB (ref 0.0–0.7)
Eosinophils Relative: 17 %
HCT: 32 % — ABNORMAL LOW (ref 39.0–52.0)
HEMOGLOBIN: 10.7 g/dL — AB (ref 13.0–17.0)
LYMPHS ABS: 1.2 10*3/uL (ref 0.7–4.0)
LYMPHS PCT: 19 %
MCH: 30.6 pg (ref 26.0–34.0)
MCHC: 33.4 g/dL (ref 30.0–36.0)
MCV: 91.4 fL (ref 78.0–100.0)
Monocytes Absolute: 0.5 10*3/uL (ref 0.1–1.0)
Monocytes Relative: 8 %
NEUTROS PCT: 56 %
Neutro Abs: 3.5 10*3/uL (ref 1.7–7.7)
Platelets: 114 10*3/uL — ABNORMAL LOW (ref 150–400)
RBC: 3.5 MIL/uL — AB (ref 4.22–5.81)
RDW: 14.8 % (ref 11.5–15.5)
WBC: 6.3 10*3/uL (ref 4.0–10.5)

## 2017-05-08 LAB — BRAIN NATRIURETIC PEPTIDE: B Natriuretic Peptide: 207 pg/mL — ABNORMAL HIGH (ref 0.0–100.0)

## 2017-05-08 LAB — TROPONIN I: TROPONIN I: 0.06 ng/mL — AB (ref <0.03)

## 2017-05-08 LAB — GLUCOSE, CAPILLARY: Glucose-Capillary: 148 mg/dL — ABNORMAL HIGH (ref 65–99)

## 2017-05-08 LAB — LIPASE, BLOOD: LIPASE: 55 U/L — AB (ref 11–51)

## 2017-05-08 MED ORDER — ENOXAPARIN SODIUM 40 MG/0.4ML ~~LOC~~ SOLN
40.0000 mg | SUBCUTANEOUS | Status: DC
  Administered 2017-05-08 – 2017-05-10 (×3): 40 mg via SUBCUTANEOUS
  Filled 2017-05-08 (×3): qty 0.4

## 2017-05-08 MED ORDER — AMLODIPINE BESYLATE 5 MG PO TABS
10.0000 mg | ORAL_TABLET | Freq: Every day | ORAL | Status: DC
  Administered 2017-05-08 – 2017-05-11 (×4): 10 mg via ORAL
  Filled 2017-05-08 (×4): qty 2

## 2017-05-08 MED ORDER — ONDANSETRON HCL 4 MG PO TABS: 4.0000 mg | ORAL_TABLET | Freq: Four times a day (QID) | ORAL | Status: DC | PRN

## 2017-05-08 MED ORDER — DEXTROSE 5 % IV SOLN
INTRAVENOUS | Status: AC
  Filled 2017-05-08: qty 500

## 2017-05-08 MED ORDER — METHYLPREDNISOLONE SODIUM SUCC 125 MG IJ SOLR
125.0000 mg | Freq: Once | INTRAMUSCULAR | Status: AC
  Administered 2017-05-08: 125 mg via INTRAVENOUS
  Filled 2017-05-08: qty 2

## 2017-05-08 MED ORDER — POTASSIUM CHLORIDE CRYS ER 20 MEQ PO TBCR
40.0000 meq | EXTENDED_RELEASE_TABLET | Freq: Once | ORAL | Status: AC
  Administered 2017-05-08: 40 meq via ORAL
  Filled 2017-05-08: qty 2

## 2017-05-08 MED ORDER — ALBUTEROL SULFATE (2.5 MG/3ML) 0.083% IN NEBU
5.0000 mg | INHALATION_SOLUTION | Freq: Once | RESPIRATORY_TRACT | Status: AC
  Administered 2017-05-08: 5 mg via RESPIRATORY_TRACT
  Filled 2017-05-08: qty 6

## 2017-05-08 MED ORDER — METHYLPREDNISOLONE SODIUM SUCC 125 MG IJ SOLR
60.0000 mg | Freq: Four times a day (QID) | INTRAMUSCULAR | Status: DC
  Administered 2017-05-09 – 2017-05-10 (×6): 60 mg via INTRAVENOUS
  Filled 2017-05-08 (×6): qty 2

## 2017-05-08 MED ORDER — DESMOPRESSIN ACETATE 0.2 MG PO TABS
0.2000 mg | ORAL_TABLET | Freq: Two times a day (BID) | ORAL | Status: DC
  Administered 2017-05-08 – 2017-05-11 (×6): 0.2 mg via ORAL
  Filled 2017-05-08 (×10): qty 1

## 2017-05-08 MED ORDER — CALCITRIOL 0.25 MCG PO CAPS
0.5000 ug | ORAL_CAPSULE | Freq: Every day | ORAL | Status: DC
  Administered 2017-05-08 – 2017-05-11 (×4): 0.5 ug via ORAL
  Filled 2017-05-08 (×4): qty 2

## 2017-05-08 MED ORDER — LEVETIRACETAM 250 MG PO TABS
250.0000 mg | ORAL_TABLET | Freq: Two times a day (BID) | ORAL | Status: DC
  Administered 2017-05-08 – 2017-05-11 (×6): 250 mg via ORAL
  Filled 2017-05-08 (×6): qty 1

## 2017-05-08 MED ORDER — ALBUTEROL SULFATE (2.5 MG/3ML) 0.083% IN NEBU
2.5000 mg | INHALATION_SOLUTION | Freq: Four times a day (QID) | RESPIRATORY_TRACT | Status: DC
  Administered 2017-05-08 – 2017-05-09 (×5): 2.5 mg via RESPIRATORY_TRACT
  Filled 2017-05-08 (×4): qty 3

## 2017-05-08 MED ORDER — TORSEMIDE 20 MG PO TABS
40.0000 mg | ORAL_TABLET | Freq: Every day | ORAL | Status: DC
  Administered 2017-05-08 – 2017-05-11 (×4): 40 mg via ORAL
  Filled 2017-05-08 (×4): qty 2

## 2017-05-08 MED ORDER — DEXTROSE 5 % IV SOLN
500.0000 mg | INTRAVENOUS | Status: DC
  Administered 2017-05-08 – 2017-05-10 (×3): 500 mg via INTRAVENOUS
  Filled 2017-05-08 (×3): qty 500

## 2017-05-08 MED ORDER — POTASSIUM CHLORIDE CRYS ER 20 MEQ PO TBCR
20.0000 meq | EXTENDED_RELEASE_TABLET | Freq: Every day | ORAL | Status: DC
  Administered 2017-05-09 – 2017-05-11 (×3): 20 meq via ORAL
  Filled 2017-05-08 (×3): qty 1

## 2017-05-08 MED ORDER — ALBUTEROL SULFATE (2.5 MG/3ML) 0.083% IN NEBU
2.5000 mg | INHALATION_SOLUTION | RESPIRATORY_TRACT | Status: DC | PRN
  Filled 2017-05-08: qty 3

## 2017-05-08 MED ORDER — DESMOPRESSIN ACETATE 0.2 MG PO TABS
ORAL_TABLET | ORAL | Status: AC
  Filled 2017-05-08: qty 1

## 2017-05-08 MED ORDER — POLYETHYLENE GLYCOL 3350 17 G PO PACK: 17.0000 g | PACK | Freq: Every day | ORAL | Status: DC | PRN

## 2017-05-08 MED ORDER — GUAIFENESIN ER 600 MG PO TB12
600.0000 mg | ORAL_TABLET | Freq: Two times a day (BID) | ORAL | Status: DC
  Administered 2017-05-08 – 2017-05-11 (×6): 600 mg via ORAL
  Filled 2017-05-08 (×6): qty 1

## 2017-05-08 MED ORDER — SODIUM CHLORIDE 0.9 % IV SOLN
Freq: Once | INTRAVENOUS | Status: AC
  Administered 2017-05-08: 13:00:00 via INTRAVENOUS

## 2017-05-08 MED ORDER — ONDANSETRON HCL 4 MG/2ML IJ SOLN: 4.0000 mg | Freq: Four times a day (QID) | INTRAMUSCULAR | Status: DC | PRN

## 2017-05-08 MED ORDER — POLYVINYL ALCOHOL 1.4 % OP SOLN
1.0000 [drp] | Freq: Two times a day (BID) | OPHTHALMIC | Status: DC
  Administered 2017-05-08 – 2017-05-11 (×5): 1 [drp] via OPHTHALMIC
  Filled 2017-05-08: qty 15

## 2017-05-08 MED ORDER — LEVOTHYROXINE SODIUM 25 MCG PO TABS
125.0000 ug | ORAL_TABLET | Freq: Every day | ORAL | Status: DC
  Administered 2017-05-09 – 2017-05-11 (×3): 125 ug via ORAL
  Filled 2017-05-08 (×3): qty 1

## 2017-05-08 MED ORDER — ISOSORB DINITRATE-HYDRALAZINE 20-37.5 MG PO TABS
1.0000 | ORAL_TABLET | Freq: Three times a day (TID) | ORAL | Status: DC
  Administered 2017-05-08 – 2017-05-11 (×8): 1 via ORAL
  Filled 2017-05-08 (×14): qty 1

## 2017-05-08 MED ORDER — ISOSORB DINITRATE-HYDRALAZINE 20-37.5 MG PO TABS
ORAL_TABLET | ORAL | Status: AC
  Filled 2017-05-08: qty 1

## 2017-05-08 MED ORDER — TIOTROPIUM BROMIDE MONOHYDRATE 18 MCG IN CAPS
18.0000 ug | ORAL_CAPSULE | Freq: Every day | RESPIRATORY_TRACT | Status: DC
  Administered 2017-05-09 – 2017-05-11 (×3): 18 ug via RESPIRATORY_TRACT
  Filled 2017-05-08: qty 5

## 2017-05-08 MED ORDER — ONDANSETRON HCL 4 MG/2ML IJ SOLN
4.0000 mg | Freq: Once | INTRAMUSCULAR | Status: AC
  Administered 2017-05-08: 4 mg via INTRAVENOUS
  Filled 2017-05-08: qty 2

## 2017-05-08 MED ORDER — PHENYTOIN SODIUM EXTENDED 100 MG PO CAPS
100.0000 mg | ORAL_CAPSULE | Freq: Three times a day (TID) | ORAL | Status: DC
  Administered 2017-05-08 – 2017-05-11 (×8): 100 mg via ORAL
  Filled 2017-05-08 (×8): qty 1
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
## 2017-05-08 NOTE — ED Notes (Signed)
CRITICAL VALUE ALERT  Critical Value:  Troponin 0.06  Date & Time Notied:  05/25/17 1323  Provider Notified: Dr. Gilford Raid  Orders Received/Actions taken: No new orders.

## 2017-05-08 NOTE — ED Triage Notes (Signed)
Pt placed on gout medicine 2 weeks ago that has caused nausea, denies vomiting. Pt also having sob x2 days, denies cp.

## 2017-05-08 NOTE — ED Notes (Signed)
Pt very sob while standing in room and was unable to ambulate, edp notified.

## 2017-05-09 ENCOUNTER — Encounter (HOSPITAL_COMMUNITY): Payer: Self-pay

## 2017-05-09 DIAGNOSIS — K521 Toxic gastroenteritis and colitis: Secondary | ICD-10-CM | POA: Diagnosis present

## 2017-05-09 DIAGNOSIS — Z85528 Personal history of other malignant neoplasm of kidney: Secondary | ICD-10-CM | POA: Diagnosis not present

## 2017-05-09 DIAGNOSIS — E1122 Type 2 diabetes mellitus with diabetic chronic kidney disease: Secondary | ICD-10-CM | POA: Diagnosis present

## 2017-05-09 DIAGNOSIS — T504X5A Adverse effect of drugs affecting uric acid metabolism, initial encounter: Secondary | ICD-10-CM | POA: Diagnosis present

## 2017-05-09 DIAGNOSIS — J9611 Chronic respiratory failure with hypoxia: Secondary | ICD-10-CM | POA: Diagnosis present

## 2017-05-09 DIAGNOSIS — E785 Hyperlipidemia, unspecified: Secondary | ICD-10-CM | POA: Diagnosis present

## 2017-05-09 DIAGNOSIS — J441 Chronic obstructive pulmonary disease with (acute) exacerbation: Secondary | ICD-10-CM | POA: Diagnosis present

## 2017-05-09 DIAGNOSIS — Z905 Acquired absence of kidney: Secondary | ICD-10-CM | POA: Diagnosis not present

## 2017-05-09 DIAGNOSIS — Z6831 Body mass index (BMI) 31.0-31.9, adult: Secondary | ICD-10-CM | POA: Diagnosis not present

## 2017-05-09 DIAGNOSIS — Z8249 Family history of ischemic heart disease and other diseases of the circulatory system: Secondary | ICD-10-CM | POA: Diagnosis not present

## 2017-05-09 DIAGNOSIS — I130 Hypertensive heart and chronic kidney disease with heart failure and stage 1 through stage 4 chronic kidney disease, or unspecified chronic kidney disease: Secondary | ICD-10-CM | POA: Diagnosis present

## 2017-05-09 DIAGNOSIS — M109 Gout, unspecified: Secondary | ICD-10-CM | POA: Diagnosis present

## 2017-05-09 DIAGNOSIS — D631 Anemia in chronic kidney disease: Secondary | ICD-10-CM | POA: Diagnosis present

## 2017-05-09 DIAGNOSIS — E876 Hypokalemia: Secondary | ICD-10-CM | POA: Diagnosis present

## 2017-05-09 DIAGNOSIS — Z9981 Dependence on supplemental oxygen: Secondary | ICD-10-CM | POA: Diagnosis not present

## 2017-05-09 DIAGNOSIS — R112 Nausea with vomiting, unspecified: Secondary | ICD-10-CM | POA: Diagnosis present

## 2017-05-09 DIAGNOSIS — Z7952 Long term (current) use of systemic steroids: Secondary | ICD-10-CM | POA: Diagnosis not present

## 2017-05-09 DIAGNOSIS — E038 Other specified hypothyroidism: Secondary | ICD-10-CM | POA: Diagnosis present

## 2017-05-09 DIAGNOSIS — Y92009 Unspecified place in unspecified non-institutional (private) residence as the place of occurrence of the external cause: Secondary | ICD-10-CM | POA: Diagnosis not present

## 2017-05-09 DIAGNOSIS — I5022 Chronic systolic (congestive) heart failure: Secondary | ICD-10-CM | POA: Diagnosis present

## 2017-05-09 DIAGNOSIS — G40909 Epilepsy, unspecified, not intractable, without status epilepticus: Secondary | ICD-10-CM | POA: Diagnosis present

## 2017-05-09 DIAGNOSIS — N184 Chronic kidney disease, stage 4 (severe): Secondary | ICD-10-CM | POA: Diagnosis present

## 2017-05-09 DIAGNOSIS — E230 Hypopituitarism: Secondary | ICD-10-CM | POA: Diagnosis present

## 2017-05-09 DIAGNOSIS — Z87891 Personal history of nicotine dependence: Secondary | ICD-10-CM | POA: Diagnosis not present

## 2017-05-09 LAB — BASIC METABOLIC PANEL
ANION GAP: 10 (ref 5–15)
BUN: 91 mg/dL — ABNORMAL HIGH (ref 6–20)
CHLORIDE: 102 mmol/L (ref 101–111)
CO2: 26 mmol/L (ref 22–32)
CREATININE: 2.83 mg/dL — AB (ref 0.61–1.24)
Calcium: 8.8 mg/dL — ABNORMAL LOW (ref 8.9–10.3)
GFR calc Af Amer: 24 mL/min — ABNORMAL LOW (ref >60)
GFR calc non Af Amer: 21 mL/min — ABNORMAL LOW (ref >60)
Glucose, Bld: 168 mg/dL — ABNORMAL HIGH (ref 65–99)
POTASSIUM: 4 mmol/L (ref 3.5–5.1)
Sodium: 138 mmol/L (ref 135–145)

## 2017-05-09 LAB — CBC
HCT: 31.2 % — ABNORMAL LOW (ref 39.0–52.0)
HEMATOCRIT: 29.4 % — AB (ref 39.0–52.0)
HEMOGLOBIN: 9.9 g/dL — AB (ref 13.0–17.0)
Hemoglobin: 10.3 g/dL — ABNORMAL LOW (ref 13.0–17.0)
MCH: 30.3 pg (ref 26.0–34.0)
MCH: 30.6 pg (ref 26.0–34.0)
MCHC: 33 g/dL (ref 30.0–36.0)
MCHC: 33.7 g/dL (ref 30.0–36.0)
MCV: 91.8 fL (ref 78.0–100.0)
MCV: 90.7 fL (ref 78.0–100.0)
PLATELETS: 120 10*3/uL — AB (ref 150–400)
Platelets: 99 10*3/uL — ABNORMAL LOW (ref 150–400)
RBC: 3.4 MIL/uL — ABNORMAL LOW (ref 4.22–5.81)
RBC: 3.24 MIL/uL — ABNORMAL LOW (ref 4.22–5.81)
RDW: 14.6 % (ref 11.5–15.5)
RDW: 14.3 % (ref 11.5–15.5)
WBC: 3.1 10*3/uL — AB (ref 4.0–10.5)
WBC: 3.6 10*3/uL — ABNORMAL LOW (ref 4.0–10.5)

## 2017-05-09 LAB — FOLATE: Folate: 4.9 ng/mL — ABNORMAL LOW (ref >5.9)

## 2017-05-09 LAB — SEDIMENTATION RATE: Sed Rate: 67 mm/hr — ABNORMAL HIGH (ref 0–16)

## 2017-05-09 LAB — VITAMIN B12: Vitamin B-12: 604 pg/mL (ref 180–914)

## 2017-05-09 LAB — TSH: TSH: 2.306 u[IU]/mL (ref 0.350–4.500)

## 2017-05-09 MED ORDER — DEXAMETHASONE 0.5 MG PO TABS
1.5000 mg | ORAL_TABLET | Freq: Every day | ORAL | Status: DC
  Administered 2017-05-09 – 2017-05-11 (×3): 1.5 mg via ORAL
  Filled 2017-05-09: qty 3
  Filled 2017-05-09: qty 1
  Filled 2017-05-09: qty 3
  Filled 2017-05-09 (×2): qty 1

## 2017-05-09 MED ORDER — LORATADINE 10 MG PO TABS
10.0000 mg | ORAL_TABLET | Freq: Every day | ORAL | Status: DC
  Administered 2017-05-09 – 2017-05-11 (×3): 10 mg via ORAL
  Filled 2017-05-09 (×3): qty 1

## 2017-05-09 MED ORDER — ACETAMINOPHEN 325 MG PO TABS
650.0000 mg | ORAL_TABLET | Freq: Four times a day (QID) | ORAL | Status: DC | PRN
  Administered 2017-05-09: 650 mg via ORAL
  Filled 2017-05-09: qty 2

## 2017-05-09 MED ORDER — ORAL CARE MOUTH RINSE
15.0000 mL | Freq: Two times a day (BID) | OROMUCOSAL | Status: DC
  Administered 2017-05-09 – 2017-05-11 (×6): 15 mL via OROMUCOSAL

## 2017-05-09 NOTE — Progress Notes (Signed)
Patient admitted with acute on chronic hypoxic respiratory failure last night placed on steroids and nebulizers as well as empiric Zithromax he has a right lower lobe stable lesion for 11 years status post nephrectomy creatinine of 2.3 as a result of this he has not smoked in over a year likewise he has panhypopituitarism on DDAVP Decadron 1.5 mg by mouth daily and monthly testosterone cypionate 100 mg MUKHTAR SHAMS [id number]:[id number] DOB: 18-Jan-1946 DOA: 05/08/2017 PCP: [name], MD   Physical Exam: Blood pressure (!) 128/43, pulse 87, temperature 98.7 F (37.1 C), temperature source Oral, resp. rate 18, height 6' 1"  (1.854 m), weight 106.6 kg (235 lb 1.6 oz), SpO2 96 %. Lungs show diminished breath sounds in the bases prolonged respiratory rate is mild end expiratory wheeze noticed no rales audible no rhonchi heart regular rhythm no S3 or S4 and he feels or rubs   Investigations:  No results found for this or any previous visit (from the past 240 hour(s)).   Basic Metabolic Panel:  Recent Labs  05/08/17 1235 05/09/17 0457  NA 140 138  K 3.0* 4.0  CL 100* 102  CO2 29 26  GLUCOSE 101* 168*  BUN 99* 91*  CREATININE 2.70* 2.83*  CALCIUM 9.3 8.8*   Liver Function Tests:  Recent Labs  05/08/17 1235  AST 21  ALT 29  ALKPHOS 38  BILITOT 0.5  PROT 7.7  ALBUMIN 4.0     CBC:  Recent Labs  05/08/17 1235 05/09/17 0457  WBC 6.3 3.1*  NEUTROABS 3.5  --   HGB 10.7* 10.3*  HCT 32.0* 31.2*  MCV 91.4 91.8  PLT 114* 120*    Ct Abdomen Pelvis Wo Contrast  Result Date: 05/08/2017 CLINICAL DATA:  Nausea and shortness of breath EXAM: CT ABDOMEN AND PELVIS WITHOUT CONTRAST TECHNIQUE: Multidetector CT imaging of the abdomen and pelvis was performed following the standard protocol without IV contrast. COMPARISON:  01/23/2017, 08/07/2016 FINDINGS: Lower chest: Lung bases demonstrate no acute consolidation or pleural effusion. There is cardiomegaly with mild coronary artery  calcification. 11 mm right lower lobe pulmonary nodule, unchanged. Hepatobiliary: No focal liver abnormality is seen. No gallstones, gallbladder wall thickening, or biliary dilatation. Pancreas: Unremarkable. No pancreatic ductal dilatation or surrounding inflammatory changes. Spleen: Normal in size without focal abnormality. Adrenals/Urinary Tract: Adrenal glands are within normal limits. Status post right nephrectomy with scarring. Left kidney shows no hydronephrosis. Multiple hyperdense nodules in the upper pole of the left kidney. 2.4 cm slightly dense lesion anterior cortex mid left kidney, stable to minimally increased in size. Additional low-density lesions within the mid pole of left kidney, the anterior lesion appears to contain small amount of calcification. Bladder unremarkable. Stomach/Bowel: Stomach is within normal limits. Appendix appears normal. No evidence of bowel wall thickening, distention, or inflammatory changes. Vascular/Lymphatic: Aortic atherosclerosis. No enlarged abdominal or pelvic lymph nodes. Reproductive: Few prostate gland calcifications.  No obvious mass. Other: No free air or free fluid. Small fat in the umbilicus. Stable right abdominal mesenteric calcification. Musculoskeletal: Degenerative changes. No acute or suspicious bone lesion. IMPRESSION: 1. No evidence for a bowel obstruction or colon wall thickening. Normal appendix. 2. Stable 11 mm right lower lobe pulmonary nodule, likely benign 3. Status post right nephrectomy. Multiple hyperdense nodules again demonstrated in the left kidney, not significantly changed in size, possibly representing hemorrhagic cysts. These are incompletely characterized without contrast. Electronically Signed   By: Donavan Foil M.D.   On: 05/08/2017 18:13   Dg Chest 2 View  Result  Date: 05/08/2017 CLINICAL DATA:  71 year old male with shortness of breath. EXAM: CHEST  2 VIEW COMPARISON:  03/09/2017 and earlier. FINDINGS: Resolved pulmonary  vascular congestion since April. Stable cardiomegaly and mediastinal contours. No pneumothorax, pulmonary edema, pleural effusion or confluent pulmonary opacity. No acute osseous abnormality identified. Visualized tracheal air column is within normal limits. Negative visible bowel gas pattern. IMPRESSION: Stable cardiomegaly. No acute cardiopulmonary abnormality. Electronically Signed   By: Genevie Ann M.D.   On: 05/08/2017 13:16      Medications:   Impression:  Principal Problem:   Acute on chronic respiratory failure with hypoxia (HCC) Active Problems:   Hypothyroid   Panhypopituitarism (HCC)   Seizure disorder (HCC)   Essential hypertension   Central hypothyroidism   CKD (chronic kidney disease) stage 4, GFR 15-29 ml/min (HCC)   COPD exacerbation ([place])     Plan: Add Decadron 1.5 mg by mouth daily as his normal dose. Continue Solu-Medrol IV as ordered and continue nebulizer continue Zithromax monitored B met in a.m. check TSH. As well as dementia workup  Consultants:    Procedures   Antibiotics:         Time spent: 30 minutes   LOS: 0 days   Lc Joynt M   05/09/2017, 10:43 AM

## 2017-05-10 LAB — BASIC METABOLIC PANEL
ANION GAP: 11 (ref 5–15)
BUN: 87 mg/dL — ABNORMAL HIGH (ref 6–20)
CO2: 23 mmol/L (ref 22–32)
CREATININE: 2.52 mg/dL — AB (ref 0.61–1.24)
Calcium: 8.9 mg/dL (ref 8.9–10.3)
Chloride: 101 mmol/L (ref 101–111)
GFR calc non Af Amer: 24 mL/min — ABNORMAL LOW (ref >60)
GFR, EST AFRICAN AMERICAN: 28 mL/min — AB (ref >60)
Glucose, Bld: 124 mg/dL — ABNORMAL HIGH (ref 65–99)
POTASSIUM: 4.5 mmol/L (ref 3.5–5.1)
SODIUM: 135 mmol/L (ref 135–145)

## 2017-05-10 LAB — RPR: RPR Ser Ql: NONREACTIVE

## 2017-05-10 LAB — HIV ANTIBODY (ROUTINE TESTING W REFLEX): HIV Screen 4th Generation wRfx: NONREACTIVE

## 2017-05-10 MED ORDER — PREDNISONE 20 MG PO TABS
40.0000 mg | ORAL_TABLET | Freq: Every day | ORAL | Status: DC
  Administered 2017-05-10 – 2017-05-11 (×2): 40 mg via ORAL
  Filled 2017-05-10 (×2): qty 2

## 2017-05-10 MED ORDER — ALBUTEROL SULFATE (2.5 MG/3ML) 0.083% IN NEBU
2.5000 mg | INHALATION_SOLUTION | Freq: Three times a day (TID) | RESPIRATORY_TRACT | Status: DC
  Administered 2017-05-10 – 2017-05-11 (×4): 2.5 mg via RESPIRATORY_TRACT
  Filled 2017-05-10 (×4): qty 3

## 2017-05-10 NOTE — Progress Notes (Signed)
Subjective: He was admitted with acute on chronic hypoxic respiratory failure. He has been on steroids nebulizer treatments and Zithromax and he says he feels much better. He says he is on something for gout that has caused him to have diarrhea but I don't see any medications listed. He says his breathing is much better. [name] not coughing much now. He's not having any swelling of his legs.  Objective: Vital signs in last 24 hours: Temp:  [97.7 F (36.5 C)-98.6 F (37 C)] 98.1 F (36.7 C) (06/03 0650) Pulse Rate:  [75-83] 75 (06/03 0650) Resp:  [20] 20 (06/03 0650) BP: (118-129)/(42-57) 126/46 (06/03 0650) SpO2:  [96 %-100 %] 98 % (06/03 0805) Weight change:  Last BM Date: 05/09/17  Intake/Output from previous day: 06/02 0701 - 06/03 0700 In: 970 [P.O.:720; IV Piggyback:250] Out: 1425 [Urine:1425]  PHYSICAL EXAM General appearance: alert, cooperative, no distress and morbidly obese Resp: clear to auscultation bilaterally Cardio: regular rate and rhythm, S1, S2 normal, no murmur, click, rub or gallop GI: soft, non-tender; bowel sounds normal; no masses,  no organomegaly Extremities: venous stasis dermatitis noted Skin warm and dry  Lab Results:  Results for orders placed or performed during the hospital encounter of 05/08/17 (from the past 48 hour(s))  Comprehensive metabolic panel     Status: Abnormal   Collection Time: 05/08/17 12:35 PM  Result Value Ref Range   Sodium 140 135 - 145 mmol/L   Potassium 3.0 (L) 3.5 - 5.1 mmol/L   Chloride 100 (L) 101 - 111 mmol/L   CO2 29 22 - 32 mmol/L   Glucose, Bld 101 (H) 65 - 99 mg/dL   BUN 99 (H) 6 - 20 mg/dL   Creatinine, Ser 2.70 (H) 0.61 - 1.24 mg/dL   Calcium 9.3 8.9 - 10.3 mg/dL   Total Protein 7.7 6.5 - 8.1 g/dL   Albumin 4.0 3.5 - 5.0 g/dL   AST 21 15 - 41 U/L   ALT 29 17 - 63 U/L   Alkaline Phosphatase 38 38 - 126 U/L   Total Bilirubin 0.5 0.3 - 1.2 mg/dL   GFR calc non Af Amer 22 (L) >60 mL/min   GFR calc Af Amer 25 (L)  >60 mL/min    Comment: (NOTE) The eGFR has been calculated using the CKD EPI equation. This calculation has not been validated in all clinical situations. eGFR's persistently <60 mL/min signify possible Chronic Kidney Disease.    Anion gap 11 5 - 15  CBC WITH DIFFERENTIAL     Status: Abnormal   Collection Time: 05/08/17 12:35 PM  Result Value Ref Range   WBC 6.3 4.0 - 10.5 K/uL   RBC 3.50 (L) 4.22 - 5.81 MIL/uL   Hemoglobin 10.7 (L) 13.0 - 17.0 g/dL   HCT 32.0 (L) 39.0 - 52.0 %   MCV 91.4 78.0 - 100.0 fL   MCH 30.6 26.0 - 34.0 pg   MCHC 33.4 30.0 - 36.0 g/dL   RDW 14.8 11.5 - 15.5 %   Platelets 114 (L) 150 - 400 K/uL    Comment: PLATELET COUNT CONFIRMED BY SMEAR SPECIMEN CHECKED FOR CLOTS    Neutrophils Relative % 56 %   Neutro Abs 3.5 1.7 - 7.7 K/uL   Lymphocytes Relative 19 %   Lymphs Abs 1.2 0.7 - 4.0 K/uL   Monocytes Relative 8 %   Monocytes Absolute 0.5 0.1 - 1.0 K/uL   Eosinophils Relative 17 %   Eosinophils Absolute 1.1 (H) 0.0 - 0.7 K/uL  Basophils Relative 1 %   Basophils Absolute 0.1 0.0 - 0.1 K/uL  Troponin I     Status: Abnormal   Collection Time: 05/08/17 12:35 PM  Result Value Ref Range   Troponin I 0.06 (HH) <0.03 ng/mL    Comment: CRITICAL RESULT CALLED TO, READ BACK BY AND VERIFIED WITH: MINTER,B @1321  ON 6.1.18 BY BAYSE,L   Brain natriuretic peptide     Status: Abnormal   Collection Time: 05/08/17 12:35 PM  Result Value Ref Range   B Natriuretic Peptide 207.0 (H) 0.0 - 100.0 pg/mL  Lipase, blood     Status: Abnormal   Collection Time: 05/08/17 12:35 PM  Result Value Ref Range   Lipase 55 (H) 11 - 51 U/L  Phenytoin level, total     Status: None   Collection Time: 05/08/17 12:35 PM  Result Value Ref Range   Phenytoin Lvl 14.5 10.0 - 20.0 ug/mL  TSH     Status: None   Collection Time: 05/08/17 12:35 PM  Result Value Ref Range   TSH 2.306 0.350 - 4.500 uIU/mL    Comment: Performed by a 3rd Generation assay with a functional sensitivity of <=0.01  uIU/mL.  Urinalysis, Routine w reflex microscopic     Status: Abnormal   Collection Time: 05/08/17  1:58 PM  Result Value Ref Range   Color, Urine YELLOW YELLOW   APPearance CLEAR CLEAR   Specific Gravity, Urine 1.010 1.005 - 1.030   pH 5.0 5.0 - 8.0   Glucose, UA NEGATIVE NEGATIVE mg/dL   Hgb urine dipstick SMALL (A) NEGATIVE   Bilirubin Urine NEGATIVE NEGATIVE   Ketones, ur NEGATIVE NEGATIVE mg/dL   Protein, ur NEGATIVE NEGATIVE mg/dL   Nitrite NEGATIVE NEGATIVE   Leukocytes, UA NEGATIVE NEGATIVE   RBC / HPF 0-5 0 - 5 RBC/hpf   WBC, UA 0-5 0 - 5 WBC/hpf   Bacteria, UA NONE SEEN NONE SEEN   Squamous Epithelial / LPF 0-5 (A) NONE SEEN  Glucose, capillary     Status: Abnormal   Collection Time: 05/08/17  9:39 PM  Result Value Ref Range   Glucose-Capillary 148 (H) 65 - 99 mg/dL  CBC     Status: Abnormal   Collection Time: 05/09/17  4:57 AM  Result Value Ref Range   WBC 3.1 (L) 4.0 - 10.5 K/uL   RBC 3.40 (L) 4.22 - 5.81 MIL/uL   Hemoglobin 10.3 (L) 13.0 - 17.0 g/dL   HCT 31.2 (L) 39.0 - 52.0 %   MCV 91.8 78.0 - 100.0 fL   MCH 30.3 26.0 - 34.0 pg   MCHC 33.0 30.0 - 36.0 g/dL   RDW 14.6 11.5 - 15.5 %   Platelets 120 (L) 150 - 400 K/uL  Basic metabolic panel     Status: Abnormal   Collection Time: 05/09/17  4:57 AM  Result Value Ref Range   Sodium 138 135 - 145 mmol/L   Potassium 4.0 3.5 - 5.1 mmol/L    Comment: DELTA CHECK NOTED NO VISIBLE HEMOLYSIS    Chloride 102 101 - 111 mmol/L   CO2 26 22 - 32 mmol/L   Glucose, Bld 168 (H) 65 - 99 mg/dL   BUN 91 (H) 6 - 20 mg/dL   Creatinine, Ser 2.83 (H) 0.61 - 1.24 mg/dL   Calcium 8.8 (L) 8.9 - 10.3 mg/dL   GFR calc non Af Amer 21 (L) >60 mL/min   GFR calc Af Amer 24 (L) >60 mL/min    Comment: (NOTE) The eGFR has  been calculated using the CKD EPI equation. This calculation has not been validated in all clinical situations. eGFR's persistently <60 mL/min signify possible Chronic Kidney Disease.    Anion gap 10 5 - 15  RPR      Status: None   Collection Time: 05/09/17 11:31 AM  Result Value Ref Range   RPR Ser Ql Non Reactive Non Reactive    Comment: (NOTE) Performed At: [facility] [address] [place], [place] [id number] Lindon Romp MD [id number]   Vitamin B12     Status: None   Collection Time: 05/09/17 11:31 AM  Result Value Ref Range   Vitamin B-12 604 180 - 914 pg/mL    Comment: (NOTE) This assay is not validated for testing neonatal or myeloproliferative syndrome specimens for Vitamin B12 levels. Performed at [facility], [place] [address], [place], [place] [address]   Sedimentation rate     Status: Abnormal   Collection Time: 05/09/17 11:31 AM  Result Value Ref Range   Sed Rate 67 (H) 0 - 16 mm/hr  CBC     Status: Abnormal   Collection Time: 05/09/17 11:31 AM  Result Value Ref Range   WBC 3.6 (L) 4.0 - 10.5 K/uL   RBC 3.24 (L) 4.22 - 5.81 MIL/uL   Hemoglobin 9.9 (L) 13.0 - 17.0 g/dL   HCT 29.4 (L) 39.0 - 52.0 %   MCV 90.7 78.0 - 100.0 fL   MCH 30.6 26.0 - 34.0 pg   MCHC 33.7 30.0 - 36.0 g/dL   RDW 14.3 11.5 - 15.5 %   Platelets 99 (L) 150 - 400 K/uL    Comment: SPECIMEN CHECKED FOR CLOTS PLATELET COUNT CONFIRMED BY SMEAR   Folate     Status: Abnormal   Collection Time: 05/09/17 11:31 AM  Result Value Ref Range   Folate 4.9 (L) >5.9 ng/mL    Comment: Performed at [facility], [place]. [address], [place], [place] [address]  HIV antibody (routine testing) (NOT for [facility])     Status: None   Collection Time: 05/09/17 11:31 AM  Result Value Ref Range   HIV Screen 4th Generation wRfx Non Reactive Non Reactive    Comment: (NOTE) Performed At: [facility] [place], [place] [id number] Lindon Romp MD IO:[id number]   Basic metabolic panel     Status: Abnormal   Collection Time: 05/10/17  6:07 AM  Result Value Ref Range   Sodium 135 135 - 145 mmol/L   Potassium 4.5 3.5 - 5.1 mmol/L   Chloride 101 101 - 111 mmol/L   CO2 23 22 - 32  mmol/L   Glucose, Bld 124 (H) 65 - 99 mg/dL   BUN 87 (H) 6 - 20 mg/dL   Creatinine, Ser 2.52 (H) 0.61 - 1.24 mg/dL   Calcium 8.9 8.9 - 10.3 mg/dL   GFR calc non Af Amer 24 (L) >60 mL/min   GFR calc Af Amer 28 (L) >60 mL/min    Comment: (NOTE) The eGFR has been calculated using the CKD EPI equation. This calculation has not been validated in all clinical situations. eGFR's persistently <60 mL/min signify possible Chronic Kidney Disease.    Anion gap 11 5 - 15    ABGS No results for input(s): PHART, PO2ART, TCO2, HCO3 in the last 72 hours.  Invalid input(s): PCO2 CULTURES No results found for this or any previous visit (from the past 240 hour(s)). Studies/Results: Ct Abdomen Pelvis Wo Contrast  Result Date: 05/08/2017  CLINICAL DATA:  Nausea and shortness of breath EXAM: CT ABDOMEN AND PELVIS WITHOUT CONTRAST TECHNIQUE: Multidetector CT imaging of the abdomen and pelvis was performed following the standard protocol without IV contrast. COMPARISON:  01/23/2017, 08/07/2016 FINDINGS: Lower chest: Lung bases demonstrate no acute consolidation or pleural effusion. There is cardiomegaly with mild coronary artery calcification. 11 mm right lower lobe pulmonary nodule, unchanged. Hepatobiliary: No focal liver abnormality is seen. No gallstones, gallbladder wall thickening, or biliary dilatation. Pancreas: Unremarkable. No pancreatic ductal dilatation or surrounding inflammatory changes. Spleen: Normal in size without focal abnormality. Adrenals/Urinary Tract: Adrenal glands are within normal limits. Status post right nephrectomy with scarring. Left kidney shows no hydronephrosis. Multiple hyperdense nodules in the upper pole of the left kidney. 2.4 cm slightly dense lesion anterior cortex mid left kidney, stable to minimally increased in size. Additional low-density lesions within the mid pole of left kidney, the anterior lesion appears to contain small amount of calcification. Bladder unremarkable.  Stomach/Bowel: Stomach is within normal limits. Appendix appears normal. No evidence of bowel wall thickening, distention, or inflammatory changes. Vascular/Lymphatic: Aortic atherosclerosis. No enlarged abdominal or pelvic lymph nodes. Reproductive: Few prostate gland calcifications.  No obvious mass. Other: No free air or free fluid. Small fat in the umbilicus. Stable right abdominal mesenteric calcification. Musculoskeletal: Degenerative changes. No acute or suspicious bone lesion. IMPRESSION: 1. No evidence for a bowel obstruction or colon wall thickening. Normal appendix. 2. Stable 11 mm right lower lobe pulmonary nodule, likely benign 3. Status post right nephrectomy. Multiple hyperdense nodules again demonstrated in the left kidney, not significantly changed in size, possibly representing hemorrhagic cysts. These are incompletely characterized without contrast. Electronically Signed   By: Donavan Foil M.D.   On: 05/08/2017 18:13   Dg Chest 2 View  Result Date: 05/08/2017 CLINICAL DATA:  71 year old male with shortness of breath. EXAM: CHEST  2 VIEW COMPARISON:  03/09/2017 and earlier. FINDINGS: Resolved pulmonary vascular congestion since April. Stable cardiomegaly and mediastinal contours. No pneumothorax, pulmonary edema, pleural effusion or confluent pulmonary opacity. No acute osseous abnormality identified. Visualized tracheal air column is within normal limits. Negative visible bowel gas pattern. IMPRESSION: Stable cardiomegaly. No acute cardiopulmonary abnormality. Electronically Signed   By: Genevie Ann M.D.   On: 05/08/2017 13:16    Medications:  Prior to Admission:  Prescriptions Prior to Admission  Medication Sig Dispense Refill Last Dose  . acetaminophen (TYLENOL) 325 MG tablet Take 650 mg by mouth every 6 (six) hours as needed.   05/07/2017 at Unknown time  . amLODipine (NORVASC) 10 MG tablet Take 1 tablet (10 mg total) by mouth daily. 30 tablet 2 05/07/2017 at Unknown time  . calcitRIOL  (ROCALTROL) 0.5 MCG capsule Take 0.5 mcg by mouth daily.   05/07/2017 at Unknown time  . colchicine 0.6 MG tablet Take 1 tablet (0.6 mg total) by mouth 2 (two) times daily. 60 tablet 2 05/07/2017 at Unknown time  . desmopressin (DDAVP) 0.2 MG tablet Take 1 tablet (0.2 mg total) by mouth 2 (two) times daily. 60 tablet 12 05/07/2017 at Unknown time  . dexamethasone (DECADRON) 1.5 MG tablet Take 1.5 mg by mouth daily.   05/07/2017 at Unknown time  . ipratropium-albuterol (DUONEB) 0.5-2.5 (3) MG/3ML SOLN Take 3 mLs by nebulization 2 (two) times daily. And additional every four hours as needed   05/07/2017 at Unknown time  . isosorbide-hydrALAZINE (BIDIL) 20-37.5 MG tablet Take 1 tablet by mouth 3 (three) times daily. 90 tablet 3 05/07/2017 at Unknown time  .  levETIRAcetam (KEPPRA) 250 MG tablet Take 1 tablet (250 mg total) by mouth 2 (two) times daily. 60 tablet 3 05/07/2017 at Unknown time  . levothyroxine (SYNTHROID, LEVOTHROID) 125 MCG tablet Take 125 mcg by mouth daily before breakfast.   05/07/2017 at Unknown time  . phenytoin (DILANTIN) 100 MG ER capsule Take 100 mg by mouth 3 (three) times daily.   05/07/2017 at Unknown time  . polyvinyl alcohol (LIQUIFILM TEARS) 1.4 % ophthalmic solution Place 1 drop into both eyes 2 (two) times daily.   05/07/2017 at Unknown time  . potassium chloride (K-DUR,KLOR-CON) 20 MEQ tablet Take 1 tablet (20 mEq total) by mouth daily. 30 tablet 3 05/07/2017 at Unknown time  . testosterone cypionate (DEPOTESTOTERONE CYPIONATE) 100 MG/ML injection For IM use only. Give 1 ml once a day on the 19 of the month   unknown at unknown  . torsemide (DEMADEX) 20 MG tablet Take 40 mg by mouth daily.   05/07/2017 at Unknown time   Scheduled: . albuterol  2.5 mg Nebulization TID  . amLODipine  10 mg Oral Daily  . calcitRIOL  0.5 mcg Oral Daily  . desmopressin  0.2 mg Oral BID  . dexamethasone  1.5 mg Oral Daily  . enoxaparin (LOVENOX) injection  40 mg Subcutaneous Q24H  . guaiFENesin  600 mg  Oral BID  . isosorbide-hydrALAZINE  1 tablet Oral TID  . levETIRAcetam  250 mg Oral BID  . levothyroxine  125 mcg Oral QAC breakfast  . loratadine  10 mg Oral Daily  . mouth rinse  15 mL Mouth Rinse BID  . methylPREDNISolone (SOLU-MEDROL) injection  60 mg Intravenous Q6H  . phenytoin  100 mg Oral TID  . polyvinyl alcohol  1 drop Both Eyes BID  . potassium chloride SA  20 mEq Oral Daily  . tiotropium  18 mcg Inhalation Daily  . torsemide  40 mg Oral Daily   Continuous: . azithromycin Stopped (05/09/17 2344)   SHU:[id number], albuterol, ondansetron **OR** ondansetron (ZOFRAN) IV, polyethylene glycol  Assesment: He was admitted with acute on chronic hypoxic respiratory failure. [name] improved. He has COPD exacerbation. 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 He has seizure disorder which is being treated. He has hypertension which is stable. He has heart failure but right now that looks well controlled. He has stage IV chronic kidney disease which I think is about the same Principal Problem:   Acute on chronic respiratory failure with hypoxia ([place]) Active Problems:   Hypothyroid   Panhypopituitarism (HCC)   Seizure disorder (HCC)   Essential hypertension   Central hypothyroidism   CKD (chronic kidney disease) stage 4, GFR 15-29 ml/min (HCC)   COPD exacerbation (HCC)    Plan: Continue medications. He might be able to go home tomorrow    LOS: 1 day   Trena Dunavan L 05/10/2017, 8:26 AM

## 2017-05-10 NOTE — Progress Notes (Signed)
Patient has less respiratory distress today over yesterday sitting up eating seen by pulmonary today currently on nebulizers IV Solu-Medrol as well as Zithromax empirically. Renal function improving with intravenous fluids monitor be met in a.m. [name] [name] ALP:[id number] DOB: November 12, 1946 DOA: 05/08/2017 PCP: [name] Fire, MD   Physical Exam: Blood pressure (!) 126/46, pulse 75, temperature 98.1 F (36.7 C), temperature source Oral, resp. rate 20, height '6\' 1"'$  (1.854 m), weight 106.6 kg (235 lb 1.6 oz), SpO2 98 %. Lungs no rales no wheezes audible prolonged respiratory phase noted no diminished breath sounds in the bases today heart regular rhythm no S3 S1 heaves thrills rubs   Investigations:  No results found for this or any previous visit (from the past 240 hour(s)).   Basic Metabolic Panel:  Recent Labs  05/09/17 0457 05/10/17 0607  NA 138 135  K 4.0 4.5  CL 102 101  CO2 26 23  GLUCOSE 168* 124*  BUN 91* 87*  CREATININE 2.83* 2.52*  CALCIUM 8.8* 8.9   Liver Function Tests:  Recent Labs  05/08/17 1235  AST 21  ALT 29  ALKPHOS 38  BILITOT 0.5  PROT 7.7  ALBUMIN 4.0     CBC:  Recent Labs  05/08/17 1235 05/09/17 0457 05/09/17 1131  WBC 6.3 3.1* 3.6*  NEUTROABS 3.5  --   --   HGB 10.7* 10.3* 9.9*  HCT 32.0* 31.2* 29.4*  MCV 91.4 91.8 90.7  PLT 114* 120* 99*    Ct Abdomen Pelvis Wo Contrast  Result Date: 05/08/2017 CLINICAL DATA:  Nausea and shortness of breath EXAM: CT ABDOMEN AND PELVIS WITHOUT CONTRAST TECHNIQUE: Multidetector CT imaging of the abdomen and pelvis was performed following the standard protocol without IV contrast. COMPARISON:  01/23/2017, 08/07/2016 FINDINGS: Lower chest: Lung bases demonstrate no acute consolidation or pleural effusion. There is cardiomegaly with mild coronary artery calcification. 11 mm right lower lobe pulmonary nodule, unchanged. Hepatobiliary: No focal liver abnormality is seen. No gallstones, gallbladder wall thickening,  or biliary dilatation. Pancreas: Unremarkable. No pancreatic ductal dilatation or surrounding inflammatory changes. Spleen: Normal in size without focal abnormality. Adrenals/Urinary Tract: Adrenal glands are within normal limits. Status post right nephrectomy with scarring. Left kidney shows no hydronephrosis. Multiple hyperdense nodules in the upper pole of the left kidney. 2.4 cm slightly dense lesion anterior cortex mid left kidney, stable to minimally increased in size. Additional low-density lesions within the mid pole of left kidney, the anterior lesion appears to contain small amount of calcification. Bladder unremarkable. Stomach/Bowel: Stomach is within normal limits. Appendix appears normal. No evidence of bowel wall thickening, distention, or inflammatory changes. Vascular/Lymphatic: Aortic atherosclerosis. No enlarged abdominal or pelvic lymph nodes. Reproductive: Few prostate gland calcifications.  No obvious mass. Other: No free air or free fluid. Small fat in the umbilicus. Stable right abdominal mesenteric calcification. Musculoskeletal: Degenerative changes. No acute or suspicious bone lesion. IMPRESSION: 1. No evidence for a bowel obstruction or colon wall thickening. Normal appendix. 2. Stable 11 mm right lower lobe pulmonary nodule, likely benign 3. Status post right nephrectomy. Multiple hyperdense nodules again demonstrated in the left kidney, not significantly changed in size, possibly representing hemorrhagic cysts. These are incompletely characterized without contrast. Electronically Signed   By: Donavan Foil M.D.   On: 05/08/2017 18:13   Dg Chest 2 View  Result Date: 05/08/2017 CLINICAL DATA:  71 year old male with shortness of breath. EXAM: CHEST  2 VIEW COMPARISON:  03/09/2017 and earlier. FINDINGS: Resolved pulmonary vascular congestion since April. Stable cardiomegaly  and mediastinal contours. No pneumothorax, pulmonary edema, pleural effusion or confluent pulmonary opacity. No  acute osseous abnormality identified. Visualized tracheal air column is within normal limits. Negative visible bowel gas pattern. IMPRESSION: Stable cardiomegaly. No acute cardiopulmonary abnormality. Electronically Signed   By: Genevie Ann M.D.   On: 05/08/2017 13:16      Medications:   Impression:  Principal Problem:   Acute on chronic respiratory failure with hypoxia (HCC) Active Problems:   Hypothyroid   Panhypopituitarism (HCC)   Seizure disorder (HCC)   Essential hypertension   Central hypothyroidism   CKD (chronic kidney disease) stage 4, GFR 15-29 ml/min (HCC)   COPD exacerbation (HCC)     Plan: Continue to decrease Solu-Medrol continue nebulizer Zithromax continue IV fluids monitor renal function improvements   Consultants:  Pulmonary   Procedures   Antibiotics: Zithromax         Time spent: 30 minutes   LOS: 1 day   [name] M   05/10/2017, 12:14 PM

## 2017-05-11 LAB — BASIC METABOLIC PANEL
ANION GAP: 10 (ref 5–15)
BUN: 91 mg/dL — ABNORMAL HIGH (ref 6–20)
CALCIUM: 8.7 mg/dL — AB (ref 8.9–10.3)
CO2: 24 mmol/L (ref 22–32)
Chloride: 101 mmol/L (ref 101–111)
Creatinine, Ser: 2.45 mg/dL — ABNORMAL HIGH (ref 0.61–1.24)
GFR, EST AFRICAN AMERICAN: 29 mL/min — AB (ref >60)
GFR, EST NON AFRICAN AMERICAN: 25 mL/min — AB (ref >60)
GLUCOSE: 128 mg/dL — AB (ref 65–99)
Potassium: 3.9 mmol/L (ref 3.5–5.1)
Sodium: 135 mmol/L (ref 135–145)

## 2017-05-11 MED ORDER — PREDNISONE 10 MG PO TABS: ORAL_TABLET | ORAL | 0 refills | Status: DC

## 2017-05-11 MED ORDER — COLCHICINE 0.6 MG PO TABS: 0.6000 mg | ORAL_TABLET | Freq: Every day | ORAL | 2 refills | Status: AC

## 2017-05-11 MED ORDER — AZITHROMYCIN 250 MG PO TABS: ORAL_TABLET | ORAL | 0 refills | Status: DC

## 2017-05-11 NOTE — Discharge Summary (Signed)
Physician Discharge Summary  Patient ID: AKASHDEEP CHUBA MRN: [id number] DOB/AGE: 04/13/1946 71 y.o. Primary Care Physician:Deztiny Sarra, MD Admit date: 05/08/2017 Discharge date: 05/11/2017    Discharge Diagnoses:   Principal Problem:   Acute on chronic respiratory failure with hypoxia (HCC) Active Problems:   Hypothyroid   Panhypopituitarism (HCC)   Seizure disorder (HCC)   Essential hypertension   Central hypothyroidism   CKD (chronic kidney disease) stage 4, GFR 15-29 ml/min (HCC)   COPD exacerbation (HCC)   Allergies as of 05/11/2017   No Known Allergies     Medication List    TAKE these medications   acetaminophen 325 MG tablet Commonly known as:  TYLENOL Take 650 mg by mouth every 6 (six) hours as needed.   amLODipine 10 MG tablet Commonly known as:  NORVASC Take 1 tablet (10 mg total) by mouth daily.   azithromycin 250 MG tablet Commonly known as:  ZITHROMAX Z-PAK 1 tab po daily   calcitRIOL 0.5 MCG capsule Commonly known as:  ROCALTROL Take 0.5 mcg by mouth daily.   colchicine 0.6 MG tablet Take 1 tablet (0.6 mg total) by mouth daily. What changed:  when to take this   desmopressin 0.2 MG tablet Commonly known as:  DDAVP Take 1 tablet (0.2 mg total) by mouth 2 (two) times daily.   dexamethasone 1.5 MG tablet Commonly known as:  DECADRON Take 1.5 mg by mouth daily.   ipratropium-albuterol 0.5-2.5 (3) MG/3ML Soln Commonly known as:  DUONEB Take 3 mLs by nebulization 2 (two) times daily. And additional every four hours as needed   isosorbide-hydrALAZINE 20-37.5 MG tablet Commonly known as:  BIDIL Take 1 tablet by mouth 3 (three) times daily.   levETIRAcetam 250 MG tablet Commonly known as:  KEPPRA Take 1 tablet (250 mg total) by mouth 2 (two) times daily.   levothyroxine 125 MCG tablet Commonly known as:  SYNTHROID, LEVOTHROID Take 125 mcg by mouth daily before breakfast.   phenytoin 100 MG ER capsule Commonly known as:  DILANTIN Take 100 mg  by mouth 3 (three) times daily.   polyvinyl alcohol 1.4 % ophthalmic solution Commonly known as:  LIQUIFILM TEARS Place 1 drop into both eyes 2 (two) times daily.   potassium chloride SA 20 MEQ tablet Commonly known as:  K-DUR,KLOR-CON Take 1 tablet (20 mEq total) by mouth daily.   predniSONE 10 MG tablet Commonly known as:  DELTASONE 4 tab po daily for 3 days, 3 tab po daily for 3 days, 2 tab po daily for 3 days, 1 tab po daily for 3 days.   testosterone cypionate 100 MG/ML injection Commonly known as:  DEPOTESTOTERONE CYPIONATE For IM use only. Give 1 ml once a day on the 19 of the month   torsemide 20 MG tablet Commonly known as:  DEMADEX Take 40 mg by mouth daily.       Discharged Condition:  improved    Consults: pulmonary  Significant Diagnostic Studies: Ct Abdomen Pelvis Wo Contrast  Result Date: 05/08/2017 CLINICAL DATA:  Nausea and shortness of breath EXAM: CT ABDOMEN AND PELVIS WITHOUT CONTRAST TECHNIQUE: Multidetector CT imaging of the abdomen and pelvis was performed following the standard protocol without IV contrast. COMPARISON:  01/23/2017, 08/07/2016 FINDINGS: Lower chest: Lung bases demonstrate no acute consolidation or pleural effusion. There is cardiomegaly with mild coronary artery calcification. 11 mm right lower lobe pulmonary nodule, unchanged. Hepatobiliary: No focal liver abnormality is seen. No gallstones, gallbladder wall thickening, or biliary dilatation. Pancreas: Unremarkable. No pancreatic  ductal dilatation or surrounding inflammatory changes. Spleen: Normal in size without focal abnormality. Adrenals/Urinary Tract: Adrenal glands are within normal limits. Status post right nephrectomy with scarring. Left kidney shows no hydronephrosis. Multiple hyperdense nodules in the upper pole of the left kidney. 2.4 cm slightly dense lesion anterior cortex mid left kidney, stable to minimally increased in size. Additional low-density lesions within the mid pole of  left kidney, the anterior lesion appears to contain small amount of calcification. Bladder unremarkable. Stomach/Bowel: Stomach is within normal limits. Appendix appears normal. No evidence of bowel wall thickening, distention, or inflammatory changes. Vascular/Lymphatic: Aortic atherosclerosis. No enlarged abdominal or pelvic lymph nodes. Reproductive: Few prostate gland calcifications.  No obvious mass. Other: No free air or free fluid. Small fat in the umbilicus. Stable right abdominal mesenteric calcification. Musculoskeletal: Degenerative changes. No acute or suspicious bone lesion. IMPRESSION: 1. No evidence for a bowel obstruction or colon wall thickening. Normal appendix. 2. Stable 11 mm right lower lobe pulmonary nodule, likely benign 3. Status post right nephrectomy. Multiple hyperdense nodules again demonstrated in the left kidney, not significantly changed in size, possibly representing hemorrhagic cysts. These are incompletely characterized without contrast. Electronically Signed   By: Donavan Foil M.D.   On: 05/08/2017 18:13   Dg Chest 2 View  Result Date: 05/08/2017 CLINICAL DATA:  71 year old male with shortness of breath. EXAM: CHEST  2 VIEW COMPARISON:  03/09/2017 and earlier. FINDINGS: Resolved pulmonary vascular congestion since April. Stable cardiomegaly and mediastinal contours. No pneumothorax, pulmonary edema, pleural effusion or confluent pulmonary opacity. No acute osseous abnormality identified. Visualized tracheal air column is within normal limits. Negative visible bowel gas pattern. IMPRESSION: Stable cardiomegaly. No acute cardiopulmonary abnormality. Electronically Signed   By: Genevie Ann M.D.   On: 05/08/2017 13:16   Dg Knee 2 Views Right  Result Date: 04/12/2017 CLINICAL DATA:  71 year old male with right knee pain and swelling, unable to stand. EXAM: RIGHT KNEE - 1-2 VIEW COMPARISON:  11/03/2016 FINDINGS: Questionable small joint effusion on the cross-table lateral view,  regressed from the prior study in November. There are chronic erosive changes along the undersurface of the patella, and these seem progressed in the central patella compared to the prior study. Medial and lateral compartment joint spaces appear normal. Femur and tib-fib bone mineralization is within normal limits. No other periarticular erosions. No fracture dislocation. IMPRESSION: 1. 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 Probable small joint effusion, but regressed since the November radiographs. 2. 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 Recommend Knee MRI without and with contrast. 3. No acute fracture. Medial and lateral joint compartments appear to remain normal. Electronically Signed   By: Genevie Ann M.D.   On: 04/12/2017 08:55    Lab Results: Basic Metabolic Panel:  Recent Labs  05/10/17 0607 05/11/17 0458  NA 135 135  K 4.5 3.9  CL 101 101  CO2 23 24  GLUCOSE 124* 128*  BUN 87* 91*  CREATININE 2.52* 2.45*  CALCIUM 8.9 8.7*   Liver Function Tests:  Recent Labs  05/08/17 1235  AST 21  ALT 29  ALKPHOS 38  BILITOT 0.5  PROT 7.7  ALBUMIN 4.0     CBC:  Recent Labs  05/08/17 1235 05/09/17 0457 05/09/17 1131  WBC 6.3 3.1* 3.6*  NEUTROABS 3.5  --   --   HGB 10.7* 10.3* 9.9*  HCT 32.0* 31.2* 29.4*  MCV 91.4 91.8 90.7  PLT 114* 120* 99*  No results found for this or any previous visit (from the past 240 hour(s)).   Hospital Course:   This is a 71 years old male with history of multiple medical illness was admitted due to acute on chronic respiratory failure with hypoxemia. [name] a known case of COPD on home oxygen and nebulizer and CPAP. He was treated with IV steroid and IV antibiotics. Over the hospital stay he improved and his medications were adjusted. [name] being discharged on oral steroid and oral antibiotics. Patient will be followed in the office in 2 weeks.  Discharge  Exam: Blood pressure (!) 116/45, pulse 74, temperature 98.9 F (37.2 C), temperature source Oral, resp. rate 16, height 6\' 1"  (1.854 m), weight 106.6 kg (235 lb 1.6 oz), SpO2 100 %.    Disposition:  home    Follow-up Information    [name], MD Follow up in 2 week(s).   Specialty:  Internal Medicine Contact information: [place] [address] [phone]           Signed: [name]   05/11/2017, 8:13 AM

## 2017-05-11 NOTE — Care Management Note (Signed)
Case Management Note  Patient Details  Name: [name] [name] MRN: [id number] Date of Birth: 11/03/1946    Expected Discharge Date:  05/11/17               Expected Discharge Plan:  [place]  In-House Referral:     Discharge planning Services  CM Consult  Post Acute Care Choice:  [place], Resumption of Svcs/PTA Provider Choice offered to:  Patient  DME Arranged:    DME Agency:     HH Arranged:  RN [place] Agency:  [facility] (now [facility])  Status of Service:  Completed, signed off  If discussed at [employer] of Stay Meetings, dates discussed:    Additional Comments: Patient discharging home today. Active with [facility] for nursing services. Resumption order placed, CM contacted MD to co-sign order. Patient has port O2 tank for transport home. CM placed EMMI transition call consult with [facility] for COPD.   Vilas Edgerly, Chauncey Reading, RN 05/11/2017, 9:42 AM

## 2017-05-11 NOTE — Care Management Important Message (Signed)
Important Message  Patient Details  Name: [name] [name] MRN: [id number] Date of Birth: 1946-10-06   Medicare Important Message Given:  Yes    Arley Garant, Chauncey Reading, RN 05/11/2017, 8:58 AM

## 2017-05-14 DIAGNOSIS — D631 Anemia in chronic kidney disease: Secondary | ICD-10-CM | POA: Diagnosis not present

## 2017-05-14 DIAGNOSIS — E1122 Type 2 diabetes mellitus with diabetic chronic kidney disease: Secondary | ICD-10-CM | POA: Diagnosis not present

## 2017-05-14 DIAGNOSIS — J441 Chronic obstructive pulmonary disease with (acute) exacerbation: Secondary | ICD-10-CM | POA: Diagnosis not present

## 2017-05-14 DIAGNOSIS — I130 Hypertensive heart and chronic kidney disease with heart failure and stage 1 through stage 4 chronic kidney disease, or unspecified chronic kidney disease: Secondary | ICD-10-CM | POA: Diagnosis not present

## 2017-05-14 DIAGNOSIS — N184 Chronic kidney disease, stage 4 (severe): Secondary | ICD-10-CM | POA: Diagnosis not present

## 2017-05-14 DIAGNOSIS — I5043 Acute on chronic combined systolic (congestive) and diastolic (congestive) heart failure: Secondary | ICD-10-CM | POA: Diagnosis not present

## 2017-05-19 ENCOUNTER — Other Ambulatory Visit: Payer: Self-pay | Admitting: Cardiovascular Disease

## 2017-05-19 DIAGNOSIS — J449 Chronic obstructive pulmonary disease, unspecified: Secondary | ICD-10-CM | POA: Diagnosis not present

## 2017-05-26 DIAGNOSIS — I272 Pulmonary hypertension, unspecified: Secondary | ICD-10-CM | POA: Diagnosis not present

## 2017-05-26 DIAGNOSIS — J9610 Chronic respiratory failure, unspecified whether with hypoxia or hypercapnia: Secondary | ICD-10-CM | POA: Diagnosis not present

## 2017-05-26 DIAGNOSIS — G40909 Epilepsy, unspecified, not intractable, without status epilepticus: Secondary | ICD-10-CM | POA: Diagnosis not present

## 2017-05-26 DIAGNOSIS — J441 Chronic obstructive pulmonary disease with (acute) exacerbation: Secondary | ICD-10-CM | POA: Diagnosis not present

## 2017-05-26 DIAGNOSIS — I4510 Unspecified right bundle-branch block: Secondary | ICD-10-CM | POA: Diagnosis not present

## 2017-05-26 DIAGNOSIS — I130 Hypertensive heart and chronic kidney disease with heart failure and stage 1 through stage 4 chronic kidney disease, or unspecified chronic kidney disease: Secondary | ICD-10-CM | POA: Diagnosis not present

## 2017-05-29 DIAGNOSIS — I10 Essential (primary) hypertension: Secondary | ICD-10-CM | POA: Diagnosis not present

## 2017-05-29 DIAGNOSIS — I2510 Atherosclerotic heart disease of native coronary artery without angina pectoris: Secondary | ICD-10-CM | POA: Diagnosis not present

## 2017-05-29 DIAGNOSIS — J449 Chronic obstructive pulmonary disease, unspecified: Secondary | ICD-10-CM | POA: Diagnosis not present

## 2017-05-29 DIAGNOSIS — I5022 Chronic systolic (congestive) heart failure: Secondary | ICD-10-CM | POA: Diagnosis not present

## 2017-06-03 DIAGNOSIS — I130 Hypertensive heart and chronic kidney disease with heart failure and stage 1 through stage 4 chronic kidney disease, or unspecified chronic kidney disease: Secondary | ICD-10-CM | POA: Diagnosis not present

## 2017-06-03 DIAGNOSIS — G40909 Epilepsy, unspecified, not intractable, without status epilepticus: Secondary | ICD-10-CM | POA: Diagnosis not present

## 2017-06-03 DIAGNOSIS — I272 Pulmonary hypertension, unspecified: Secondary | ICD-10-CM | POA: Diagnosis not present

## 2017-06-03 DIAGNOSIS — J441 Chronic obstructive pulmonary disease with (acute) exacerbation: Secondary | ICD-10-CM | POA: Diagnosis not present

## 2017-06-03 DIAGNOSIS — J9610 Chronic respiratory failure, unspecified whether with hypoxia or hypercapnia: Secondary | ICD-10-CM | POA: Diagnosis not present

## 2017-06-03 DIAGNOSIS — I4510 Unspecified right bundle-branch block: Secondary | ICD-10-CM | POA: Diagnosis not present

## 2017-06-04 ENCOUNTER — Emergency Department (HOSPITAL_COMMUNITY): Payer: Medicare HMO

## 2017-06-04 ENCOUNTER — Encounter (HOSPITAL_COMMUNITY): Payer: Self-pay | Admitting: Emergency Medicine

## 2017-06-04 ENCOUNTER — Inpatient Hospital Stay (HOSPITAL_COMMUNITY)
Admission: EM | Admit: 2017-06-04 | Discharge: 2017-06-08 | DRG: 191 | Disposition: A | Discharge status: Home / Self Care | Payer: Medicare HMO | Attending: Pulmonary Disease | Admitting: Pulmonary Disease

## 2017-06-04 DIAGNOSIS — R0789 Other chest pain: Secondary | ICD-10-CM | POA: Diagnosis not present

## 2017-06-04 DIAGNOSIS — I10 Essential (primary) hypertension: Secondary | ICD-10-CM | POA: Diagnosis present

## 2017-06-04 DIAGNOSIS — Z8249 Family history of ischemic heart disease and other diseases of the circulatory system: Secondary | ICD-10-CM | POA: Diagnosis not present

## 2017-06-04 DIAGNOSIS — E1122 Type 2 diabetes mellitus with diabetic chronic kidney disease: Secondary | ICD-10-CM | POA: Diagnosis present

## 2017-06-04 DIAGNOSIS — I5042 Chronic combined systolic (congestive) and diastolic (congestive) heart failure: Secondary | ICD-10-CM | POA: Diagnosis not present

## 2017-06-04 DIAGNOSIS — I272 Pulmonary hypertension, unspecified: Secondary | ICD-10-CM | POA: Diagnosis present

## 2017-06-04 DIAGNOSIS — I130 Hypertensive heart and chronic kidney disease with heart failure and stage 1 through stage 4 chronic kidney disease, or unspecified chronic kidney disease: Secondary | ICD-10-CM | POA: Diagnosis present

## 2017-06-04 DIAGNOSIS — Z905 Acquired absence of kidney: Secondary | ICD-10-CM

## 2017-06-04 DIAGNOSIS — E039 Hypothyroidism, unspecified: Secondary | ICD-10-CM | POA: Diagnosis present

## 2017-06-04 DIAGNOSIS — R630 Anorexia: Secondary | ICD-10-CM | POA: Diagnosis present

## 2017-06-04 DIAGNOSIS — J9610 Chronic respiratory failure, unspecified whether with hypoxia or hypercapnia: Secondary | ICD-10-CM | POA: Diagnosis not present

## 2017-06-04 DIAGNOSIS — G40909 Epilepsy, unspecified, not intractable, without status epilepticus: Secondary | ICD-10-CM | POA: Diagnosis present

## 2017-06-04 DIAGNOSIS — Z9981 Dependence on supplemental oxygen: Secondary | ICD-10-CM

## 2017-06-04 DIAGNOSIS — R079 Chest pain, unspecified: Secondary | ICD-10-CM | POA: Diagnosis present

## 2017-06-04 DIAGNOSIS — Z87891 Personal history of nicotine dependence: Secondary | ICD-10-CM | POA: Diagnosis not present

## 2017-06-04 DIAGNOSIS — E119 Type 2 diabetes mellitus without complications: Secondary | ICD-10-CM

## 2017-06-04 DIAGNOSIS — E662 Morbid (severe) obesity with alveolar hypoventilation: Secondary | ICD-10-CM | POA: Diagnosis present

## 2017-06-04 DIAGNOSIS — Z6830 Body mass index (BMI) 30.0-30.9, adult: Secondary | ICD-10-CM

## 2017-06-04 DIAGNOSIS — I4510 Unspecified right bundle-branch block: Secondary | ICD-10-CM | POA: Diagnosis present

## 2017-06-04 DIAGNOSIS — I351 Nonrheumatic aortic (valve) insufficiency: Secondary | ICD-10-CM | POA: Diagnosis present

## 2017-06-04 DIAGNOSIS — T426X5A Adverse effect of other antiepileptic and sedative-hypnotic drugs, initial encounter: Secondary | ICD-10-CM | POA: Diagnosis present

## 2017-06-04 DIAGNOSIS — Z809 Family history of malignant neoplasm, unspecified: Secondary | ICD-10-CM

## 2017-06-04 DIAGNOSIS — E785 Hyperlipidemia, unspecified: Secondary | ICD-10-CM | POA: Diagnosis present

## 2017-06-04 DIAGNOSIS — J441 Chronic obstructive pulmonary disease with (acute) exacerbation: Secondary | ICD-10-CM | POA: Diagnosis not present

## 2017-06-04 DIAGNOSIS — A0472 Enterocolitis due to Clostridium difficile, not specified as recurrent: Secondary | ICD-10-CM | POA: Diagnosis not present

## 2017-06-04 DIAGNOSIS — N184 Chronic kidney disease, stage 4 (severe): Secondary | ICD-10-CM | POA: Diagnosis not present

## 2017-06-04 DIAGNOSIS — E230 Hypopituitarism: Secondary | ICD-10-CM | POA: Diagnosis not present

## 2017-06-04 LAB — BASIC METABOLIC PANEL
Anion gap: 9 (ref 5–15)
BUN: 35 mg/dL — AB (ref 6–20)
CALCIUM: 8.9 mg/dL (ref 8.9–10.3)
CO2: 30 mmol/L (ref 22–32)
CREATININE: 2.87 mg/dL — AB (ref 0.61–1.24)
Chloride: 99 mmol/L — ABNORMAL LOW (ref 101–111)
GFR calc Af Amer: 24 mL/min — ABNORMAL LOW (ref >60)
GFR calc non Af Amer: 20 mL/min — ABNORMAL LOW (ref >60)
GLUCOSE: 91 mg/dL (ref 65–99)
Potassium: 3.8 mmol/L (ref 3.5–5.1)
Sodium: 138 mmol/L (ref 135–145)

## 2017-06-04 LAB — CBC
HCT: 32 % — ABNORMAL LOW (ref 39.0–52.0)
HEMOGLOBIN: 10.5 g/dL — AB (ref 13.0–17.0)
MCH: 30.9 pg (ref 26.0–34.0)
MCHC: 32.8 g/dL (ref 30.0–36.0)
MCV: 94.1 fL (ref 78.0–100.0)
PLATELETS: 155 10*3/uL (ref 150–400)
RBC: 3.4 MIL/uL — ABNORMAL LOW (ref 4.22–5.81)
RDW: 15.1 % (ref 11.5–15.5)
WBC: 5.8 10*3/uL (ref 4.0–10.5)

## 2017-06-04 LAB — TROPONIN I
TROPONIN I: 0.03 ng/mL — AB (ref <0.03)
Troponin I: 0.03 ng/mL (ref <0.03)

## 2017-06-04 LAB — URINALYSIS, ROUTINE W REFLEX MICROSCOPIC
Bilirubin Urine: NEGATIVE
GLUCOSE, UA: NEGATIVE mg/dL
Hgb urine dipstick: NEGATIVE
KETONES UR: NEGATIVE mg/dL
LEUKOCYTES UA: NEGATIVE
Nitrite: NEGATIVE
PH: 5 (ref 5.0–8.0)
Protein, ur: NEGATIVE mg/dL
Specific Gravity, Urine: 1.009 (ref 1.005–1.030)

## 2017-06-04 MED ORDER — ALBUTEROL (5 MG/ML) CONTINUOUS INHALATION SOLN
10.0000 mg/h | INHALATION_SOLUTION | RESPIRATORY_TRACT | Status: DC
  Administered 2017-06-04: 10 mg/h via RESPIRATORY_TRACT
  Filled 2017-06-04: qty 20

## 2017-06-04 MED ORDER — ALBUTEROL SULFATE (2.5 MG/3ML) 0.083% IN NEBU
5.0000 mg | INHALATION_SOLUTION | Freq: Once | RESPIRATORY_TRACT | Status: AC
  Administered 2017-06-04: 5 mg via RESPIRATORY_TRACT
  Filled 2017-06-04: qty 6

## 2017-06-04 MED ORDER — METHYLPREDNISOLONE SODIUM SUCC 125 MG IJ SOLR
125.0000 mg | Freq: Once | INTRAMUSCULAR | Status: AC
  Administered 2017-06-04: 125 mg via INTRAVENOUS
  Filled 2017-06-04: qty 2

## 2017-06-04 NOTE — ED Notes (Signed)
CRITICAL VALUE ALERT  Critical Value:  Troponin 0.03  Date & Time Notied:  06/04/17 1800  Provider Notified: Lita Mains MD   Orders Received/Actions taken: None

## 2017-06-04 NOTE — ED Notes (Signed)
Pt only able to ambulate 1/4 around nurses station.  Pt states "I ain't going to be able to make it".  O2 saturation dropped to 91% on 2L [place].

## 2017-06-04 NOTE — ED Triage Notes (Signed)
Pt states CP started this am in the center of chest radiating to left shoulder. C/o SOB.  EKG done in triage

## 2017-06-05 ENCOUNTER — Other Ambulatory Visit: Payer: Self-pay

## 2017-06-05 DIAGNOSIS — R079 Chest pain, unspecified: Secondary | ICD-10-CM | POA: Diagnosis present

## 2017-06-05 DIAGNOSIS — E039 Hypothyroidism, unspecified: Secondary | ICD-10-CM

## 2017-06-05 DIAGNOSIS — G40909 Epilepsy, unspecified, not intractable, without status epilepticus: Secondary | ICD-10-CM

## 2017-06-05 DIAGNOSIS — R0789 Other chest pain: Secondary | ICD-10-CM | POA: Diagnosis not present

## 2017-06-05 LAB — C DIFFICILE QUICK SCREEN W PCR REFLEX
C DIFFICILE (CDIFF) TOXIN: NEGATIVE
C DIFFICLE (CDIFF) ANTIGEN: POSITIVE — AB

## 2017-06-05 LAB — GLUCOSE, CAPILLARY: GLUCOSE-CAPILLARY: 130 mg/dL — AB (ref 65–99)

## 2017-06-05 LAB — CBC
HCT: 32.6 % — ABNORMAL LOW (ref 39.0–52.0)
Hemoglobin: 10.7 g/dL — ABNORMAL LOW (ref 13.0–17.0)
MCH: 30.7 pg (ref 26.0–34.0)
MCHC: 32.8 g/dL (ref 30.0–36.0)
MCV: 93.4 fL (ref 78.0–100.0)
PLATELETS: 150 10*3/uL (ref 150–400)
RBC: 3.49 MIL/uL — ABNORMAL LOW (ref 4.22–5.81)
RDW: 15.1 % (ref 11.5–15.5)
WBC: 3.8 10*3/uL — AB (ref 4.0–10.5)

## 2017-06-05 LAB — TROPONIN I
TROPONIN I: 0.03 ng/mL — AB (ref <0.03)
Troponin I: 0.03 ng/mL (ref <0.03)
Troponin I: 0.03 ng/mL (ref <0.03)

## 2017-06-05 LAB — CREATININE, SERUM
Creatinine, Ser: 3.07 mg/dL — ABNORMAL HIGH (ref 0.61–1.24)
GFR calc non Af Amer: 19 mL/min — ABNORMAL LOW (ref >60)
GFR, EST AFRICAN AMERICAN: 22 mL/min — AB (ref >60)

## 2017-06-05 MED ORDER — AMLODIPINE BESYLATE 5 MG PO TABS
10.0000 mg | ORAL_TABLET | Freq: Every day | ORAL | Status: DC
  Administered 2017-06-05 – 2017-06-08 (×4): 10 mg via ORAL
  Filled 2017-06-05 (×5): qty 2

## 2017-06-05 MED ORDER — ISOSORB DINITRATE-HYDRALAZINE 20-37.5 MG PO TABS
1.0000 | ORAL_TABLET | Freq: Three times a day (TID) | ORAL | Status: DC
  Administered 2017-06-05 – 2017-06-08 (×10): 1 via ORAL
  Filled 2017-06-05 (×16): qty 1

## 2017-06-05 MED ORDER — SODIUM CHLORIDE 0.9 % IV SOLN: 250.0000 mL | INTRAVENOUS | Status: DC | PRN

## 2017-06-05 MED ORDER — SODIUM CHLORIDE 0.9% FLUSH: 3.0000 mL | INTRAVENOUS | Status: DC | PRN

## 2017-06-05 MED ORDER — TORSEMIDE 20 MG PO TABS
40.0000 mg | ORAL_TABLET | Freq: Every day | ORAL | Status: DC
  Administered 2017-06-05 – 2017-06-08 (×4): 40 mg via ORAL
  Filled 2017-06-05 (×5): qty 2

## 2017-06-05 MED ORDER — DESMOPRESSIN ACETATE 0.2 MG PO TABS
0.2000 mg | ORAL_TABLET | Freq: Two times a day (BID) | ORAL | Status: DC
  Administered 2017-06-05 – 2017-06-08 (×7): 0.2 mg via ORAL
  Filled 2017-06-05 (×11): qty 1

## 2017-06-05 MED ORDER — POLYVINYL ALCOHOL 1.4 % OP SOLN
1.0000 [drp] | Freq: Two times a day (BID) | OPHTHALMIC | Status: DC
  Administered 2017-06-05 – 2017-06-08 (×8): 1 [drp] via OPHTHALMIC
  Filled 2017-06-05 (×3): qty 15

## 2017-06-05 MED ORDER — LOPERAMIDE HCL 2 MG PO CAPS
2.0000 mg | ORAL_CAPSULE | ORAL | Status: DC | PRN
  Administered 2017-06-05: 2 mg via ORAL
  Filled 2017-06-05: qty 1

## 2017-06-05 MED ORDER — ENOXAPARIN SODIUM 40 MG/0.4ML ~~LOC~~ SOLN: 40.0000 mg | SUBCUTANEOUS | Status: DC

## 2017-06-05 MED ORDER — POTASSIUM CHLORIDE CRYS ER 20 MEQ PO TBCR
20.0000 meq | EXTENDED_RELEASE_TABLET | Freq: Every day | ORAL | Status: DC
  Administered 2017-06-05 – 2017-06-08 (×4): 20 meq via ORAL
  Filled 2017-06-05 (×5): qty 1

## 2017-06-05 MED ORDER — PHENYTOIN SODIUM EXTENDED 100 MG PO CAPS
100.0000 mg | ORAL_CAPSULE | Freq: Three times a day (TID) | ORAL | Status: DC
  Administered 2017-06-05 – 2017-06-08 (×10): 100 mg via ORAL
  Filled 2017-06-05 (×11): qty 1

## 2017-06-05 MED ORDER — SODIUM CHLORIDE 0.9% FLUSH
3.0000 mL | Freq: Two times a day (BID) | INTRAVENOUS | Status: DC
  Administered 2017-06-05 – 2017-06-08 (×6): 3 mL via INTRAVENOUS

## 2017-06-05 MED ORDER — CALCITRIOL 0.5 MCG PO CAPS
0.5000 ug | ORAL_CAPSULE | Freq: Every day | ORAL | Status: DC
  Administered 2017-06-05 – 2017-06-08 (×4): 0.5 ug via ORAL
  Filled 2017-06-05 (×6): qty 1

## 2017-06-05 MED ORDER — MORPHINE SULFATE (PF) 2 MG/ML IV SOLN: 2.0000 mg | INTRAVENOUS | Status: DC | PRN

## 2017-06-05 MED ORDER — ENOXAPARIN SODIUM 30 MG/0.3ML ~~LOC~~ SOLN
30.0000 mg | SUBCUTANEOUS | Status: DC
  Administered 2017-06-05 – 2017-06-07 (×3): 30 mg via SUBCUTANEOUS
  Filled 2017-06-05 (×5): qty 0.3

## 2017-06-05 MED ORDER — LEVETIRACETAM 250 MG PO TABS
250.0000 mg | ORAL_TABLET | Freq: Two times a day (BID) | ORAL | Status: DC
  Administered 2017-06-05 – 2017-06-08 (×8): 250 mg via ORAL
  Filled 2017-06-05 (×9): qty 1

## 2017-06-05 MED ORDER — CLONIDINE HCL 0.2 MG PO TABS
0.2000 mg | ORAL_TABLET | Freq: Three times a day (TID) | ORAL | Status: DC
  Administered 2017-06-05 – 2017-06-08 (×10): 0.2 mg via ORAL
  Filled 2017-06-05 (×11): qty 1

## 2017-06-05 MED ORDER — ONDANSETRON HCL 4 MG/2ML IJ SOLN: 4.0000 mg | Freq: Four times a day (QID) | INTRAMUSCULAR | Status: DC | PRN

## 2017-06-05 MED ORDER — DEXAMETHASONE 0.5 MG PO TABS
1.5000 mg | ORAL_TABLET | Freq: Every day | ORAL | Status: DC
  Administered 2017-06-05 – 2017-06-08 (×4): 1.5 mg via ORAL
  Filled 2017-06-05 (×5): qty 3
  Filled 2017-06-05: qty 1

## 2017-06-05 MED ORDER — IPRATROPIUM-ALBUTEROL 0.5-2.5 (3) MG/3ML IN SOLN
3.0000 mL | Freq: Two times a day (BID) | RESPIRATORY_TRACT | Status: DC
  Administered 2017-06-05 – 2017-06-06 (×3): 3 mL via RESPIRATORY_TRACT
  Filled 2017-06-05 (×3): qty 3

## 2017-06-05 MED ORDER — COLCHICINE 0.6 MG PO TABS
0.6000 mg | ORAL_TABLET | Freq: Every day | ORAL | Status: DC
  Administered 2017-06-05 – 2017-06-08 (×4): 0.6 mg via ORAL
  Filled 2017-06-05 (×5): qty 1

## 2017-06-05 MED ORDER — LEVOTHYROXINE SODIUM 50 MCG PO TABS
125.0000 ug | ORAL_TABLET | Freq: Every day | ORAL | Status: DC
  Administered 2017-06-05 – 2017-06-08 (×4): 125 ug via ORAL
  Filled 2017-06-05 (×5): qty 3

## 2017-06-05 MED ORDER — ACETAMINOPHEN 325 MG PO TABS: 650.0000 mg | ORAL_TABLET | Freq: Four times a day (QID) | ORAL | Status: DC | PRN
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
## 2017-06-05 NOTE — Care Management Note (Signed)
Case Management Note  Patient Details  Name: [name] [name] MRN: [id number] Date of Birth: 1946-05-06  Subjective/Objective:  Adm with CP. From home alone, has family that visit's often. He walks with a RW. Has home oxygen with [employer]. Daughter will bring portable tank for transport home if needed although patient reports he does not wear continuously.  He has PCP, transportation and insurance with prescription coverage. [name] active with [facility] with nursing, however has been canceling [facility] appointments.                  Action/Plan: Plans to return home with self care. Tim with [facility] aware of OBS status.   Expected Discharge Date:      06/06/2017            Expected Discharge Plan:  [place]  In-House Referral:     Discharge planning Services  CM Consult  Post Acute Care Choice:  Home Health, Resumption of Svcs/PTA Provider Choice offered to:     DME Arranged:    DME Agency:     HH Arranged:    [place]:  [facility] (now [facility])  Status of Service:  In process, will continue to follow  If discussed at Long Length of Stay Meetings, dates discussed:    Additional Comments:  [name] [name], [name] [place], RN 06/05/2017, 11:57 AM

## 2017-06-05 NOTE — Progress Notes (Signed)
Subjective: He says he feels better. No chest pain now. No other new complaints. His breathing is doing okay.  Objective: Vital signs in last 24 hours: Temp:  [98.4 F (36.9 C)-98.7 F (37.1 C)] 98.7 F (37.1 C) (06/29 0220) Pulse Rate:  [80-95] 88 (06/29 0220) Resp:  [13-17] 16 (06/29 0220) BP: (116-128)/(43-52) 124/49 (06/29 0220) SpO2:  [95 %-100 %] 99 % (06/29 0754) Weight:  [105.8 kg (233 lb 4 oz)-108.9 kg (240 lb)] 105.8 kg (233 lb 4 oz) (06/29 0220) Weight change:  Last BM Date: 06/04/17  Intake/Output from previous day: 06/28 0701 - 06/29 0700 In: -  Out: 300 [Stool:300]  PHYSICAL EXAM General appearance: alert, cooperative and no distress Resp: clear to auscultation bilaterally Cardio: His heart is regular. [name] mildly tender chest wall on the left GI: soft, non-tender; bowel sounds normal; no masses,  no organomegaly Extremities: venous stasis dermatitis noted Skin warm and dry  Lab Results:  Results for orders placed or performed during the hospital encounter of 06/04/17 (from the past 48 hour(s))  Basic metabolic panel     Status: Abnormal   Collection Time: 06/04/17  5:10 PM  Result Value Ref Range   Sodium 138 135 - 145 mmol/L   Potassium 3.8 3.5 - 5.1 mmol/L   Chloride 99 (L) 101 - 111 mmol/L   CO2 30 22 - 32 mmol/L   Glucose, Bld 91 65 - 99 mg/dL   BUN 35 (H) 6 - 20 mg/dL   Creatinine, Ser 2.87 (H) 0.61 - 1.24 mg/dL   Calcium 8.9 8.9 - 10.3 mg/dL   GFR calc non Af Amer 20 (L) >60 mL/min   GFR calc Af Amer 24 (L) >60 mL/min    Comment: (NOTE) The eGFR has been calculated using the CKD EPI equation. This calculation has not been validated in all clinical situations. eGFR's persistently <60 mL/min signify possible Chronic Kidney Disease.    Anion gap 9 5 - 15  CBC     Status: Abnormal   Collection Time: 06/04/17  5:10 PM  Result Value Ref Range   WBC 5.8 4.0 - 10.5 K/uL   RBC 3.40 (L) 4.22 - 5.81 MIL/uL   Hemoglobin 10.5 (L) 13.0 - 17.0 g/dL   HCT 32.0 (L) 39.0 - 52.0 %   MCV 94.1 78.0 - 100.0 fL   MCH 30.9 26.0 - 34.0 pg   MCHC 32.8 30.0 - 36.0 g/dL   RDW 15.1 11.5 - 15.5 %   Platelets 155 150 - 400 K/uL  Troponin I     Status: Abnormal   Collection Time: 06/04/17  5:10 PM  Result Value Ref Range   Troponin I 0.03 (HH) <0.03 ng/mL    Comment: CRITICAL RESULT CALLED TO, READ BACK BY AND VERIFIED WITH: EVERETTE,R ON 06/04/17 AT 1755 BY LOY,C   Troponin I     Status: Abnormal   Collection Time: 06/04/17  7:32 PM  Result Value Ref Range   Troponin I 0.03 (HH) <0.03 ng/mL    Comment: CRITICAL VALUE NOTED.  VALUE IS CONSISTENT WITH PREVIOUSLY REPORTED AND CALLED VALUE.  Urinalysis, Routine w reflex microscopic     Status: None   Collection Time: 06/04/17  8:23 PM  Result Value Ref Range   Color, Urine YELLOW YELLOW   APPearance CLEAR CLEAR   Specific Gravity, Urine 1.009 1.005 - 1.030   pH 5.0 5.0 - 8.0   Glucose, UA NEGATIVE NEGATIVE mg/dL   Hgb urine dipstick NEGATIVE NEGATIVE  Bilirubin Urine NEGATIVE NEGATIVE   Ketones, ur NEGATIVE NEGATIVE mg/dL   Protein, ur NEGATIVE NEGATIVE mg/dL   Nitrite NEGATIVE NEGATIVE   Leukocytes, UA NEGATIVE NEGATIVE  Glucose, capillary     Status: Abnormal   Collection Time: 06/05/17  2:31 AM  Result Value Ref Range   Glucose-Capillary 130 (H) 65 - 99 mg/dL   Comment 1 Notify RN    Comment 2 Document in Chart   CBC     Status: Abnormal   Collection Time: 06/05/17  2:47 AM  Result Value Ref Range   WBC 3.8 (L) 4.0 - 10.5 K/uL   RBC 3.49 (L) 4.22 - 5.81 MIL/uL   Hemoglobin 10.7 (L) 13.0 - 17.0 g/dL   HCT 32.6 (L) 39.0 - 52.0 %   MCV 93.4 78.0 - 100.0 fL   MCH 30.7 26.0 - 34.0 pg   MCHC 32.8 30.0 - 36.0 g/dL   RDW 15.1 11.5 - 15.5 %   Platelets 150 150 - 400 K/uL  Creatinine, serum     Status: Abnormal   Collection Time: 06/05/17  2:47 AM  Result Value Ref Range   Creatinine, Ser 3.07 (H) 0.61 - 1.24 mg/dL   GFR calc non Af Amer 19 (L) >60 mL/min   GFR calc Af Amer 22 (L) >60  mL/min    Comment: (NOTE) The eGFR has been calculated using the CKD EPI equation. This calculation has not been validated in all clinical situations. eGFR's persistently <60 mL/min signify possible Chronic Kidney Disease.   Troponin I (q 6hr x 3)     Status: Abnormal   Collection Time: 06/05/17  2:47 AM  Result Value Ref Range   Troponin I 0.03 (HH) <0.03 ng/mL    Comment: CRITICAL VALUE NOTED.  VALUE IS CONSISTENT WITH PREVIOUSLY REPORTED AND CALLED VALUE.    ABGS No results for input(s): PHART, PO2ART, TCO2, HCO3 in the last 72 hours.  Invalid input(s): PCO2 CULTURES No results found for this or any previous visit (from the past 240 hour(s)). Studies/Results: Dg Chest 2 View  Result Date: 06/04/2017 CLINICAL DATA:  Left chest pain radiating to the shoulder EXAM: CHEST  2 VIEW COMPARISON:  05/08/2017 FINDINGS: There is no focal infiltrate or effusion. Mild to moderate cardiomegaly, unchanged. Mild thickening along the right fissure. No pneumothorax. Surgical clips in the upper abdomen. IMPRESSION: Cardiomegaly without edema or infiltrate Electronically Signed   By: Donavan Foil M.D.   On: 06/04/2017 18:17    Medications:  Prior to Admission:  Prescriptions Prior to Admission  Medication Sig Dispense Refill Last Dose  . levETIRAcetam (KEPPRA) 250 MG tablet Take 1 tablet (250 mg total) by mouth 2 (two) times daily. 60 tablet 3 06/04/2017 at 10-1200n  . levothyroxine (SYNTHROID, LEVOTHROID) 125 MCG tablet Take 125 mcg by mouth daily before breakfast.   06/04/2017 at Unknown time  . phenytoin (DILANTIN) 100 MG ER capsule Take 100 mg by mouth 3 (three) times daily.   06/04/2017 at 10-1200n  . testosterone cypionate (DEPOTESTOSTERONE CYPIONATE) 200 MG/ML injection Inject 200 mg into the muscle every 28 (twenty-eight) days.    Past Week at Unknown time  . acetaminophen (TYLENOL) 325 MG tablet Take 650 mg by mouth every 6 (six) hours as needed.   05/07/2017 at Unknown time  . amLODipine  (NORVASC) 10 MG tablet Take 1 tablet (10 mg total) by mouth daily. 30 tablet 2 05/07/2017 at Unknown time  . calcitRIOL (ROCALTROL) 0.5 MCG capsule Take 0.5 mcg by mouth daily.  05/07/2017 at Unknown time  . cloNIDine (CATAPRES) 0.2 MG tablet TAKE (1) TABLET BY MOUTH (3) TIMES DAILY 90 tablet 0   . colchicine 0.6 MG tablet Take 1 tablet (0.6 mg total) by mouth daily. 60 tablet 2   . desmopressin (DDAVP) 0.2 MG tablet Take 1 tablet (0.2 mg total) by mouth 2 (two) times daily. 60 tablet 12 05/07/2017 at Unknown time  . dexamethasone (DECADRON) 1.5 MG tablet Take 1.5 mg by mouth daily.   05/07/2017 at Unknown time  . ipratropium-albuterol (DUONEB) 0.5-2.5 (3) MG/3ML SOLN Take 3 mLs by nebulization 2 (two) times daily. And additional every four hours as needed   05/07/2017 at Unknown time  . isosorbide-hydrALAZINE (BIDIL) 20-37.5 MG tablet Take 1 tablet by mouth 3 (three) times daily. 90 tablet 3 05/07/2017 at Unknown time  . polyvinyl alcohol (LIQUIFILM TEARS) 1.4 % ophthalmic solution Place 1 drop into both eyes 2 (two) times daily.   05/07/2017 at Unknown time  . potassium chloride (K-DUR,KLOR-CON) 20 MEQ tablet Take 1 tablet (20 mEq total) by mouth daily. 30 tablet 3 05/07/2017 at Unknown time  . predniSONE (DELTASONE) 10 MG tablet 4 tab po daily for 3 days, 3 tab po daily for 3 days, 2 tab po daily for 3 days, 1 tab po daily for 3 days. 30 tablet 0   . torsemide (DEMADEX) 20 MG tablet Take 40 mg by mouth daily.   05/07/2017 at Unknown time   Scheduled: . amLODipine  10 mg Oral Daily  . calcitRIOL  0.5 mcg Oral Daily  . cloNIDine  0.2 mg Oral TID  . colchicine  0.6 mg Oral Daily  . desmopressin  0.2 mg Oral BID  . dexamethasone  1.5 mg Oral Daily  . enoxaparin (LOVENOX) injection  30 mg Subcutaneous Q24H  . ipratropium-albuterol  3 mL Nebulization BID  . isosorbide-hydrALAZINE  1 tablet Oral TID  . levETIRAcetam  250 mg Oral BID  . levothyroxine  125 mcg Oral QAC breakfast  . phenytoin  100 mg Oral  TID  . polyvinyl alcohol  1 drop Both Eyes BID  . potassium chloride SA  20 mEq Oral Daily  . torsemide  40 mg Oral Daily   Continuous:  LXB:[id number], morphine injection, ondansetron (ZOFRAN) IV  Assesment: He was admitted with chest pain. His troponin is 0.3 but he has renal failure so I don't think we can rely on that. He does not have any chest pain now. He does have some chest wall tenderness. He has multiple other medical problems and I think it's probably best to keep him in the hospital today and let him go home tomorrow rather than discharge today. My concern is that with his multiple problems he may be a very early readmit. Active Problems:   Hypothyroid   Seizure disorder ([place])   Chest pain    Plan: Continue observation today. Likely discharge tomorrow    LOS: 0 days   Stephie Xu L 06/05/2017, 8:45 AM

## 2017-06-05 NOTE — Care Management Obs Status (Signed)
[facility] NOTIFICATION   Patient Details  Name: PALMER FAHRNER MRN: [id number] Date of Birth: 1946-09-17   Medicare Observation Status Notification Given:  Yes    Trong Gosling, Chauncey [place], RN 06/05/2017, 11:45 AM

## 2017-06-06 DIAGNOSIS — I5042 Chronic combined systolic (congestive) and diastolic (congestive) heart failure: Secondary | ICD-10-CM | POA: Diagnosis present

## 2017-06-06 DIAGNOSIS — T426X5A Adverse effect of other antiepileptic and sedative-hypnotic drugs, initial encounter: Secondary | ICD-10-CM | POA: Diagnosis present

## 2017-06-06 DIAGNOSIS — A0472 Enterocolitis due to Clostridium difficile, not specified as recurrent: Secondary | ICD-10-CM | POA: Diagnosis present

## 2017-06-06 DIAGNOSIS — R0789 Other chest pain: Secondary | ICD-10-CM | POA: Diagnosis present

## 2017-06-06 DIAGNOSIS — E230 Hypopituitarism: Secondary | ICD-10-CM | POA: Diagnosis present

## 2017-06-06 DIAGNOSIS — J441 Chronic obstructive pulmonary disease with (acute) exacerbation: Secondary | ICD-10-CM | POA: Diagnosis present

## 2017-06-06 DIAGNOSIS — Z9981 Dependence on supplemental oxygen: Secondary | ICD-10-CM | POA: Diagnosis not present

## 2017-06-06 DIAGNOSIS — Z809 Family history of malignant neoplasm, unspecified: Secondary | ICD-10-CM | POA: Diagnosis not present

## 2017-06-06 DIAGNOSIS — E785 Hyperlipidemia, unspecified: Secondary | ICD-10-CM | POA: Diagnosis present

## 2017-06-06 DIAGNOSIS — G40909 Epilepsy, unspecified, not intractable, without status epilepticus: Secondary | ICD-10-CM | POA: Diagnosis present

## 2017-06-06 DIAGNOSIS — I130 Hypertensive heart and chronic kidney disease with heart failure and stage 1 through stage 4 chronic kidney disease, or unspecified chronic kidney disease: Secondary | ICD-10-CM | POA: Diagnosis present

## 2017-06-06 DIAGNOSIS — R630 Anorexia: Secondary | ICD-10-CM | POA: Diagnosis present

## 2017-06-06 DIAGNOSIS — E1122 Type 2 diabetes mellitus with diabetic chronic kidney disease: Secondary | ICD-10-CM | POA: Diagnosis present

## 2017-06-06 DIAGNOSIS — E039 Hypothyroidism, unspecified: Secondary | ICD-10-CM | POA: Diagnosis present

## 2017-06-06 DIAGNOSIS — N184 Chronic kidney disease, stage 4 (severe): Secondary | ICD-10-CM | POA: Diagnosis present

## 2017-06-06 DIAGNOSIS — J9610 Chronic respiratory failure, unspecified whether with hypoxia or hypercapnia: Secondary | ICD-10-CM | POA: Diagnosis present

## 2017-06-06 DIAGNOSIS — Z6830 Body mass index (BMI) 30.0-30.9, adult: Secondary | ICD-10-CM | POA: Diagnosis not present

## 2017-06-06 DIAGNOSIS — E662 Morbid (severe) obesity with alveolar hypoventilation: Secondary | ICD-10-CM | POA: Diagnosis present

## 2017-06-06 DIAGNOSIS — I272 Pulmonary hypertension, unspecified: Secondary | ICD-10-CM | POA: Diagnosis present

## 2017-06-06 DIAGNOSIS — Z8249 Family history of ischemic heart disease and other diseases of the circulatory system: Secondary | ICD-10-CM | POA: Diagnosis not present

## 2017-06-06 DIAGNOSIS — Z905 Acquired absence of kidney: Secondary | ICD-10-CM | POA: Diagnosis not present

## 2017-06-06 DIAGNOSIS — Z87891 Personal history of nicotine dependence: Secondary | ICD-10-CM | POA: Diagnosis not present

## 2017-06-06 DIAGNOSIS — I351 Nonrheumatic aortic (valve) insufficiency: Secondary | ICD-10-CM | POA: Diagnosis present

## 2017-06-06 DIAGNOSIS — I4510 Unspecified right bundle-branch block: Secondary | ICD-10-CM | POA: Diagnosis present

## 2017-06-06 LAB — CLOSTRIDIUM DIFFICILE BY PCR: Toxigenic C. Difficile by PCR: POSITIVE — AB

## 2017-06-06 MED ORDER — IPRATROPIUM-ALBUTEROL 0.5-2.5 (3) MG/3ML IN SOLN
3.0000 mL | RESPIRATORY_TRACT | Status: DC
  Administered 2017-06-06 – 2017-06-08 (×12): 3 mL via RESPIRATORY_TRACT
  Filled 2017-06-06 (×12): qty 3

## 2017-06-06 MED ORDER — METHYLPREDNISOLONE SODIUM SUCC 40 MG IJ SOLR
40.0000 mg | Freq: Two times a day (BID) | INTRAMUSCULAR | Status: DC
  Administered 2017-06-06 – 2017-06-08 (×5): 40 mg via INTRAVENOUS
  Filled 2017-06-06 (×5): qty 1

## 2017-06-06 MED ORDER — GUAIFENESIN ER 600 MG PO TB12
1200.0000 mg | ORAL_TABLET | Freq: Two times a day (BID) | ORAL | Status: DC
  Administered 2017-06-06 – 2017-06-08 (×5): 1200 mg via ORAL
  Filled 2017-06-06 (×5): qty 2

## 2017-06-06 MED ORDER — VANCOMYCIN 50 MG/ML ORAL SOLUTION
125.0000 mg | Freq: Four times a day (QID) | ORAL | Status: DC
  Administered 2017-06-06 – 2017-06-08 (×9): 125 mg via ORAL
  Filled 2017-06-06 (×17): qty 2.5

## 2017-06-06 NOTE — Progress Notes (Signed)
Subjective: He says he feels worse. [name] tight in his chest. This feels like his COPD. He was also positive for C. difficile.  Objective: Vital signs in last 24 hours: Temp:  [98.1 F (36.7 C)-98.7 F (37.1 C)] 98.7 F (37.1 C) (06/30 0607) Pulse Rate:  [77-84] 79 (06/30 0607) Resp:  [16] 16 (06/30 0607) BP: (106-126)/(37-58) 122/40 (06/30 0952) SpO2:  [98 %-100 %] 100 % (06/30 0800) Weight change:  Last BM Date: 06/04/17  Intake/Output from previous day: 06/29 0701 - 06/30 0700 In: 240 [P.O.:240] Out: -   PHYSICAL EXAM General appearance: alert, cooperative and mild distress Resp: rhonchi bilaterally and wheezes bilaterally Cardio: regular rate and rhythm, S1, S2 normal, no murmur, click, rub or gallop GI: soft, non-tender; bowel sounds normal; no masses,  no organomegaly Extremities: venous stasis dermatitis noted Skin warm and dry  Lab Results:  Results for orders placed or performed during the hospital encounter of 06/04/17 (from the past 48 hour(s))  Basic metabolic panel     Status: Abnormal   Collection Time: 06/04/17  5:10 PM  Result Value Ref Range   Sodium 138 135 - 145 mmol/L   Potassium 3.8 3.5 - 5.1 mmol/L   Chloride 99 (L) 101 - 111 mmol/L   CO2 30 22 - 32 mmol/L   Glucose, Bld 91 65 - 99 mg/dL   BUN 35 (H) 6 - 20 mg/dL   Creatinine, Ser 2.87 (H) 0.61 - 1.24 mg/dL   Calcium 8.9 8.9 - 10.3 mg/dL   GFR calc non Af Amer 20 (L) >60 mL/min   GFR calc Af Amer 24 (L) >60 mL/min    Comment: (NOTE) The eGFR has been calculated using the CKD EPI equation. This calculation has not been validated in all clinical situations. eGFR's persistently <60 mL/min signify possible Chronic Kidney Disease.    Anion gap 9 5 - 15  CBC     Status: Abnormal   Collection Time: 06/04/17  5:10 PM  Result Value Ref Range   WBC 5.8 4.0 - 10.5 K/uL   RBC 3.40 (L) 4.22 - 5.81 MIL/uL   Hemoglobin 10.5 (L) 13.0 - 17.0 g/dL   HCT 32.0 (L) 39.0 - 52.0 %   MCV 94.1 78.0 - 100.0 fL    MCH 30.9 26.0 - 34.0 pg   MCHC 32.8 30.0 - 36.0 g/dL   RDW 15.1 11.5 - 15.5 %   Platelets 155 150 - 400 K/uL  Troponin I     Status: Abnormal   Collection Time: 06/04/17  5:10 PM  Result Value Ref Range   Troponin I 0.03 (HH) <0.03 ng/mL    Comment: CRITICAL RESULT CALLED TO, READ BACK BY AND VERIFIED WITH: EVERETTE,R ON 06/04/17 AT 1755 BY LOY,C   Troponin I     Status: Abnormal   Collection Time: 06/04/17  7:32 PM  Result Value Ref Range   Troponin I 0.03 (HH) <0.03 ng/mL    Comment: CRITICAL VALUE NOTED.  VALUE IS CONSISTENT WITH PREVIOUSLY REPORTED AND CALLED VALUE.  Urinalysis, Routine w reflex microscopic     Status: None   Collection Time: 06/04/17  8:23 PM  Result Value Ref Range   Color, Urine YELLOW YELLOW   APPearance CLEAR CLEAR   Specific Gravity, Urine 1.009 1.005 - 1.030   pH 5.0 5.0 - 8.0   Glucose, UA NEGATIVE NEGATIVE mg/dL   Hgb urine dipstick NEGATIVE NEGATIVE   Bilirubin Urine NEGATIVE NEGATIVE   Ketones, ur NEGATIVE NEGATIVE mg/dL  Protein, ur NEGATIVE NEGATIVE mg/dL   Nitrite NEGATIVE NEGATIVE   Leukocytes, UA NEGATIVE NEGATIVE  Glucose, capillary     Status: Abnormal   Collection Time: 06/05/17  2:31 AM  Result Value Ref Range   Glucose-Capillary 130 (H) 65 - 99 mg/dL   Comment 1 Notify RN    Comment 2 Document in Chart   CBC     Status: Abnormal   Collection Time: 06/05/17  2:47 AM  Result Value Ref Range   WBC 3.8 (L) 4.0 - 10.5 K/uL   RBC 3.49 (L) 4.22 - 5.81 MIL/uL   Hemoglobin 10.7 (L) 13.0 - 17.0 g/dL   HCT 32.6 (L) 39.0 - 52.0 %   MCV 93.4 78.0 - 100.0 fL   MCH 30.7 26.0 - 34.0 pg   MCHC 32.8 30.0 - 36.0 g/dL   RDW 15.1 11.5 - 15.5 %   Platelets 150 150 - 400 K/uL  Creatinine, serum     Status: Abnormal   Collection Time: 06/05/17  2:47 AM  Result Value Ref Range   Creatinine, Ser 3.07 (H) 0.61 - 1.24 mg/dL   GFR calc non Af Amer 19 (L) >60 mL/min   GFR calc Af Amer 22 (L) >60 mL/min    Comment: (NOTE) The eGFR has been  calculated using the CKD EPI equation. This calculation has not been validated in all clinical situations. eGFR's persistently <60 mL/min signify possible Chronic Kidney Disease.   Troponin I (q 6hr x 3)     Status: Abnormal   Collection Time: 06/05/17  2:47 AM  Result Value Ref Range   Troponin I 0.03 (HH) <0.03 ng/mL    Comment: CRITICAL VALUE NOTED.  VALUE IS CONSISTENT WITH PREVIOUSLY REPORTED AND CALLED VALUE.  Troponin I (q 6hr x 3)     Status: Abnormal   Collection Time: 06/05/17  7:57 AM  Result Value Ref Range   Troponin I 0.03 (HH) <0.03 ng/mL    Comment: CRITICAL VALUE NOTED.  VALUE IS CONSISTENT WITH PREVIOUSLY REPORTED AND CALLED VALUE.  Troponin I (q 6hr x 3)     Status: Abnormal   Collection Time: 06/05/17  1:09 PM  Result Value Ref Range   Troponin I 0.03 (HH) <0.03 ng/mL    Comment: CRITICAL VALUE NOTED.  VALUE IS CONSISTENT WITH PREVIOUSLY REPORTED AND CALLED VALUE.  C difficile quick scan w PCR reflex     Status: Abnormal   Collection Time: 06/05/17  2:50 PM  Result Value Ref Range   C Diff antigen POSITIVE (A) NEGATIVE   C Diff toxin NEGATIVE NEGATIVE   C Diff interpretation Results are indeterminate. See PCR results.   Clostridium Difficile by PCR     Status: Abnormal   Collection Time: 06/05/17  2:50 PM  Result Value Ref Range   Toxigenic C Difficile by pcr POSITIVE (A) NEGATIVE    Comment: Positive for toxigenic C. difficile with little to no toxin production. Only treat if clinical presentation suggests symptomatic illness.    ABGS No results for input(s): PHART, PO2ART, TCO2, HCO3 in the last 72 hours.  Invalid input(s): PCO2 CULTURES Recent Results (from the past 240 hour(s))  C difficile quick scan w PCR reflex     Status: Abnormal   Collection Time: 06/05/17  2:50 PM  Result Value Ref Range Status   C Diff antigen POSITIVE (A) NEGATIVE Final   C Diff toxin NEGATIVE NEGATIVE Final   C Diff interpretation Results are indeterminate. See PCR  results.  Final  Clostridium Difficile by PCR     Status: Abnormal   Collection Time: 06/05/17  2:50 PM  Result Value Ref Range Status   Toxigenic C Difficile by pcr POSITIVE (A) NEGATIVE Final    Comment: Positive for toxigenic C. difficile with little to no toxin production. Only treat if clinical presentation suggests symptomatic illness.   Studies/Results: Dg Chest 2 View  Result Date: 06/04/2017 CLINICAL DATA:  Left chest pain radiating to the shoulder EXAM: CHEST  2 VIEW COMPARISON:  05/08/2017 FINDINGS: There is no focal infiltrate or effusion. Mild to moderate cardiomegaly, unchanged. Mild thickening along the right fissure. No pneumothorax. Surgical clips in the upper abdomen. IMPRESSION: Cardiomegaly without edema or infiltrate Electronically Signed   By: Donavan Foil M.D.   On: 06/04/2017 18:17    Medications:  Prior to Admission:  Prescriptions Prior to Admission  Medication Sig Dispense Refill Last Dose  . acetaminophen (TYLENOL) 325 MG tablet Take 650 mg by mouth every 6 (six) hours as needed.   unknown  . amLODipine (NORVASC) 10 MG tablet Take 1 tablet (10 mg total) by mouth daily. 30 tablet 2 06/04/2017 at Unknown time  . calcitRIOL (ROCALTROL) 0.5 MCG capsule Take 0.5 mcg by mouth daily.   06/04/2017 at Unknown time  . cloNIDine (CATAPRES) 0.2 MG tablet TAKE (1) TABLET BY MOUTH (3) TIMES DAILY 90 tablet 0 06/04/2017 at Unknown time  . colchicine 0.6 MG tablet Take 1 tablet (0.6 mg total) by mouth daily. 60 tablet 2 06/04/2017 at Unknown time  . desmopressin (DDAVP) 0.2 MG tablet Take 1 tablet (0.2 mg total) by mouth 2 (two) times daily. 60 tablet 12 06/04/2017 at Unknown time  . dexamethasone (DECADRON) 1.5 MG tablet Take 1.5 mg by mouth daily.   06/04/2017 at Unknown time  . ipratropium-albuterol (DUONEB) 0.5-2.5 (3) MG/3ML SOLN Take 3 mLs by nebulization 2 (two) times daily. And additional every four hours as needed   06/04/2017 at Unknown time  . isosorbide-hydrALAZINE (BIDIL)  20-37.5 MG tablet Take 1 tablet by mouth 3 (three) times daily. 90 tablet 3 06/04/2017 at Unknown time  . levETIRAcetam (KEPPRA) 250 MG tablet Take 1 tablet (250 mg total) by mouth 2 (two) times daily. 60 tablet 3 06/04/2017 at 10-1200n  . levothyroxine (SYNTHROID, LEVOTHROID) 125 MCG tablet Take 125 mcg by mouth daily before breakfast.   06/04/2017 at Unknown time  . phenytoin (DILANTIN) 100 MG ER capsule Take 100 mg by mouth 3 (three) times daily.   06/04/2017 at 10-1200n  . polyvinyl alcohol (LIQUIFILM TEARS) 1.4 % ophthalmic solution Place 1 drop into both eyes 2 (two) times daily.   06/04/2017 at Unknown time  . potassium chloride (K-DUR,KLOR-CON) 20 MEQ tablet Take 1 tablet (20 mEq total) by mouth daily. 30 tablet 3 06/04/2017 at Unknown time  . predniSONE (DELTASONE) 10 MG tablet 4 tab po daily for 3 days, 3 tab po daily for 3 days, 2 tab po daily for 3 days, 1 tab po daily for 3 days. 30 tablet 0 06/04/2017 at Unknown time  . testosterone cypionate (DEPOTESTOSTERONE CYPIONATE) 200 MG/ML injection Inject 200 mg into the muscle every 28 (twenty-eight) days.    Past Week at Unknown time  . torsemide (DEMADEX) 20 MG tablet Take 40 mg by mouth daily.   06/04/2017 at Unknown time   Scheduled: . amLODipine  10 mg Oral Daily  . calcitRIOL  0.5 mcg Oral Daily  . cloNIDine  0.2 mg Oral TID  . colchicine  0.6 mg Oral  Daily  . desmopressin  0.2 mg Oral BID  . dexamethasone  1.5 mg Oral Daily  . enoxaparin (LOVENOX) injection  30 mg Subcutaneous Q24H  . guaiFENesin  1,200 mg Oral BID  . ipratropium-albuterol  3 mL Nebulization Q4H  . isosorbide-hydrALAZINE  1 tablet Oral TID  . levETIRAcetam  250 mg Oral BID  . levothyroxine  125 mcg Oral QAC breakfast  . methylPREDNISolone (SOLU-MEDROL) injection  40 mg Intravenous Q12H  . phenytoin  100 mg Oral TID  . polyvinyl alcohol  1 drop Both Eyes BID  . potassium chloride SA  20 mEq Oral Daily  . sodium chloride flush  3 mL Intravenous Q12H  . torsemide  40  mg Oral Daily  . vancomycin  125 mg Oral Q6H   Continuous: . sodium chloride     YJE:HUDJSH chloride, acetaminophen, loperamide, morphine injection, ondansetron (ZOFRAN) IV, sodium chloride flush  Assesment: He was admitted with chest pain. He has not had any evidence of acute coronary syndrome. He has heart failure at baseline and that seems to be doing okay. I think he now has COPD exacerbation and although I had initially thought that he might be able to go home today I think he is going to need to be admitted to inpatient and treated for that. He also has C. difficile and will need to start treatment for that Active Problems:   Hypothyroid   Seizure disorder ([place])   Chest pain   COPD with acute exacerbation ([place])    Plan: Continue current treatments    LOS: 0 days   Toye Rouillard L 06/06/2017, 10:03 AM

## 2017-06-07 NOTE — Progress Notes (Signed)
Subjective: He says he feels better. Much less cough and congestion. He's not wheezing now. He still has some chest wall pain. His diarrhea has essentially stopped  Objective: Vital signs in last 24 hours: Temp:  [98.6 F (37 C)-99.1 F (37.3 C)] 98.8 F (37.1 C) (07/01 0555) Pulse Rate:  [82-95] 88 (07/01 0555) Resp:  [16-18] 18 (07/01 0555) BP: (107-130)/(46-63) 130/46 (07/01 0913) SpO2:  [96 %-100 %] 100 % (07/01 0754) Weight change:  Last BM Date: 06/05/17  Intake/Output from previous day: 06/30 0701 - 07/01 0700 In: 606 [P.O.:600; I.V.:6] Out: 800 [Urine:800]  PHYSICAL EXAM General appearance: alert, cooperative and mild distress Resp: clear to auscultation bilaterally Cardio: regular rate and rhythm, S1, S2 normal, no murmur, click, rub or gallop GI: soft, non-tender; bowel sounds normal; no masses,  no organomegaly Extremities: venous stasis dermatitis noted Skin warm and dry  Lab Results:  Results for orders placed or performed during the hospital encounter of 06/04/17 (from the past 48 hour(s))  Troponin I (q 6hr x 3)     Status: Abnormal   Collection Time: 06/05/17  1:09 PM  Result Value Ref Range   Troponin I 0.03 (HH) <0.03 ng/mL    Comment: CRITICAL VALUE NOTED.  VALUE IS CONSISTENT WITH PREVIOUSLY REPORTED AND CALLED VALUE.  C difficile quick scan w PCR reflex     Status: Abnormal   Collection Time: 06/05/17  2:50 PM  Result Value Ref Range   C Diff antigen POSITIVE (A) NEGATIVE   C Diff toxin NEGATIVE NEGATIVE   C Diff interpretation Results are indeterminate. See PCR results.   Clostridium Difficile by PCR     Status: Abnormal   Collection Time: 06/05/17  2:50 PM  Result Value Ref Range   Toxigenic C Difficile by pcr POSITIVE (A) NEGATIVE    Comment: Positive for toxigenic C. difficile with little to no toxin production. Only treat if clinical presentation suggests symptomatic illness.    ABGS No results for input(s): PHART, PO2ART, TCO2, HCO3 in the  last 72 hours.  Invalid input(s): PCO2 CULTURES Recent Results (from the past 240 hour(s))  C difficile quick scan w PCR reflex     Status: Abnormal   Collection Time: 06/05/17  2:50 PM  Result Value Ref Range Status   C Diff antigen POSITIVE (A) NEGATIVE Final   C Diff toxin NEGATIVE NEGATIVE Final   C Diff interpretation Results are indeterminate. See PCR results.  Final  Clostridium Difficile by PCR     Status: Abnormal   Collection Time: 06/05/17  2:50 PM  Result Value Ref Range Status   Toxigenic C Difficile by pcr POSITIVE (A) NEGATIVE Final    Comment: Positive for toxigenic C. difficile with little to no toxin production. Only treat if clinical presentation suggests symptomatic illness.   Studies/Results: No results found.  Medications:  Prior to Admission:  Prescriptions Prior to Admission  Medication Sig Dispense Refill Last Dose  . acetaminophen (TYLENOL) 325 MG tablet Take 650 mg by mouth every 6 (six) hours as needed.   unknown  . amLODipine (NORVASC) 10 MG tablet Take 1 tablet (10 mg total) by mouth daily. 30 tablet 2 06/04/2017 at Unknown time  . calcitRIOL (ROCALTROL) 0.5 MCG capsule Take 0.5 mcg by mouth daily.   06/04/2017 at Unknown time  . cloNIDine (CATAPRES) 0.2 MG tablet TAKE (1) TABLET BY MOUTH (3) TIMES DAILY 90 tablet 0 06/04/2017 at Unknown time  . colchicine 0.6 MG tablet Take 1 tablet (0.6 mg  total) by mouth daily. 60 tablet 2 06/04/2017 at Unknown time  . desmopressin (DDAVP) 0.2 MG tablet Take 1 tablet (0.2 mg total) by mouth 2 (two) times daily. 60 tablet 12 06/04/2017 at Unknown time  . dexamethasone (DECADRON) 1.5 MG tablet Take 1.5 mg by mouth daily.   06/04/2017 at Unknown time  . ipratropium-albuterol (DUONEB) 0.5-2.5 (3) MG/3ML SOLN Take 3 mLs by nebulization 2 (two) times daily. And additional every four hours as needed   06/04/2017 at Unknown time  . isosorbide-hydrALAZINE (BIDIL) 20-37.5 MG tablet Take 1 tablet by mouth 3 (three) times daily. 90  tablet 3 06/04/2017 at Unknown time  . levETIRAcetam (KEPPRA) 250 MG tablet Take 1 tablet (250 mg total) by mouth 2 (two) times daily. 60 tablet 3 06/04/2017 at 10-1200n  . levothyroxine (SYNTHROID, LEVOTHROID) 125 MCG tablet Take 125 mcg by mouth daily before breakfast.   06/04/2017 at Unknown time  . phenytoin (DILANTIN) 100 MG ER capsule Take 100 mg by mouth 3 (three) times daily.   06/04/2017 at 10-1200n  . polyvinyl alcohol (LIQUIFILM TEARS) 1.4 % ophthalmic solution Place 1 drop into both eyes 2 (two) times daily.   06/04/2017 at Unknown time  . potassium chloride (K-DUR,KLOR-CON) 20 MEQ tablet Take 1 tablet (20 mEq total) by mouth daily. 30 tablet 3 06/04/2017 at Unknown time  . predniSONE (DELTASONE) 10 MG tablet 4 tab po daily for 3 days, 3 tab po daily for 3 days, 2 tab po daily for 3 days, 1 tab po daily for 3 days. 30 tablet 0 06/04/2017 at Unknown time  . testosterone cypionate (DEPOTESTOSTERONE CYPIONATE) 200 MG/ML injection Inject 200 mg into the muscle every 28 (twenty-eight) days.    Past Week at Unknown time  . torsemide (DEMADEX) 20 MG tablet Take 40 mg by mouth daily.   06/04/2017 at Unknown time   Scheduled: . amLODipine  10 mg Oral Daily  . calcitRIOL  0.5 mcg Oral Daily  . cloNIDine  0.2 mg Oral TID  . colchicine  0.6 mg Oral Daily  . desmopressin  0.2 mg Oral BID  . dexamethasone  1.5 mg Oral Daily  . enoxaparin (LOVENOX) injection  30 mg Subcutaneous Q24H  . guaiFENesin  1,200 mg Oral BID  . ipratropium-albuterol  3 mL Nebulization Q4H  . isosorbide-hydrALAZINE  1 tablet Oral TID  . levETIRAcetam  250 mg Oral BID  . levothyroxine  125 mcg Oral QAC breakfast  . methylPREDNISolone (SOLU-MEDROL) injection  40 mg Intravenous Q12H  . phenytoin  100 mg Oral TID  . polyvinyl alcohol  1 drop Both Eyes BID  . potassium chloride SA  20 mEq Oral Daily  . sodium chloride flush  3 mL Intravenous Q12H  . torsemide  40 mg Oral Daily  . vancomycin  125 mg Oral Q6H   Continuous: .  sodium chloride     MEQ:ASTMHD chloride, acetaminophen, loperamide, morphine injection, ondansetron (ZOFRAN) IV, sodium chloride flush  Assesment: He was admitted with chest pain and ruled out for acute coronary syndrome. He has COPD with acute exacerbation and he is much better now. He has had diarrhea had an equivocal C. difficile but he's been in and out of the hospital on multiple occasions in a skilled care facility so he is going to be treated. [name] better since treatment has started. He may be ready for discharge tomorrow Active Problems:   Hypothyroid   Seizure disorder ([place])   Chest pain   COPD with acute exacerbation ([place])  Plan: Possible discharge tomorrow    LOS: 1 day   Tylee Yum L 06/07/2017, 10:16 AM

## 2017-06-08 MED ORDER — METRONIDAZOLE 500 MG PO TABS: 500.0000 mg | ORAL_TABLET | Freq: Three times a day (TID) | ORAL | 0 refills | Status: AC

## 2017-06-08 NOTE — Discharge Summary (Signed)
Physician Discharge Summary  Patient ID: [name] [name] MRN: [id number] DOB/AGE: 09-Jun-1946 71 y.o. Primary Care Physician:Fanta, Tesfaye, MD Admit date: 06/04/2017 Discharge date: 06/08/2017    Discharge Diagnoses:   Active Problems:   Hypothyroid   Panhypopituitarism ([place])   Seizure disorder (HCC)   Diabetes ([place])   Essential hypertension   Chronic respiratory failure (HCC)   Chest pain   COPD with acute exacerbation (HCC)   Allergies as of 06/08/2017   No Known Allergies     Medication List    TAKE these medications   acetaminophen 325 MG tablet Commonly known as:  TYLENOL Take 650 mg by mouth every 6 (six) hours as needed.   amLODipine 10 MG tablet Commonly known as:  NORVASC Take 1 tablet (10 mg total) by mouth daily.   calcitRIOL 0.5 MCG capsule Commonly known as:  ROCALTROL Take 0.5 mcg by mouth daily.   cloNIDine 0.2 MG tablet Commonly known as:  CATAPRES TAKE (1) TABLET BY MOUTH (3) TIMES DAILY   colchicine 0.6 MG tablet Take 1 tablet (0.6 mg total) by mouth daily.   desmopressin 0.2 MG tablet Commonly known as:  DDAVP Take 1 tablet (0.2 mg total) by mouth 2 (two) times daily.   dexamethasone 1.5 MG tablet Commonly known as:  DECADRON Take 1.5 mg by mouth daily.   ipratropium-albuterol 0.5-2.5 (3) MG/3ML Soln Commonly known as:  DUONEB Take 3 mLs by nebulization 2 (two) times daily. And additional every four hours as needed   isosorbide-hydrALAZINE 20-37.5 MG tablet Commonly known as:  BIDIL Take 1 tablet by mouth 3 (three) times daily.   levETIRAcetam 250 MG tablet Commonly known as:  KEPPRA Take 1 tablet (250 mg total) by mouth 2 (two) times daily.   levothyroxine 125 MCG tablet Commonly known as:  SYNTHROID, LEVOTHROID Take 125 mcg by mouth daily before breakfast.   metroNIDAZOLE 500 MG tablet Commonly known as:  FLAGYL Take 1 tablet (500 mg total) by mouth 3 (three) times daily.   phenytoin 100 MG ER capsule Commonly known as:   DILANTIN Take 100 mg by mouth 3 (three) times daily.   polyvinyl alcohol 1.4 % ophthalmic solution Commonly known as:  LIQUIFILM TEARS Place 1 drop into both eyes 2 (two) times daily.   potassium chloride SA 20 MEQ tablet Commonly known as:  K-DUR,KLOR-CON Take 1 tablet (20 mEq total) by mouth daily.   predniSONE 10 MG tablet Commonly known as:  DELTASONE 4 tab po daily for 3 days, 3 tab po daily for 3 days, 2 tab po daily for 3 days, 1 tab po daily for 3 days.   testosterone cypionate 200 MG/ML injection Commonly known as:  DEPOTESTOSTERONE CYPIONATE Inject 200 mg into the muscle every 28 (twenty-eight) days.   torsemide 20 MG tablet Commonly known as:  DEMADEX Take 40 mg by mouth daily.       Discharged Condition:Improved    Consults: None  Significant Diagnostic Studies: Dg Chest 2 View  Result Date: 06/04/2017 CLINICAL DATA:  Left chest pain radiating to the shoulder EXAM: CHEST  2 VIEW COMPARISON:  05/08/2017 FINDINGS: There is no focal infiltrate or effusion. Mild to moderate cardiomegaly, unchanged. Mild thickening along the right fissure. No pneumothorax. Surgical clips in the upper abdomen. IMPRESSION: Cardiomegaly without edema or infiltrate Electronically Signed   By: Donavan Foil M.D.   On: 06/04/2017 18:17    Lab Results: Basic Metabolic Panel: No results for input(s): NA, K, CL, CO2, GLUCOSE, BUN, CREATININE, CALCIUM, MG,  PHOS in the last 72 hours. Liver Function Tests: No results for input(s): AST, ALT, ALKPHOS, BILITOT, PROT, ALBUMIN in the last 72 hours.   CBC: No results for input(s): WBC, NEUTROABS, HGB, HCT, MCV, PLT in the last 72 hours.  Recent Results (from the past 240 hour(s))  C difficile quick scan w PCR reflex     Status: Abnormal   Collection Time: 06/05/17  2:50 PM  Result Value Ref Range Status   C Diff antigen POSITIVE (A) NEGATIVE Final   C Diff toxin NEGATIVE NEGATIVE Final   C Diff interpretation Results are indeterminate. See  PCR results.  Final  Clostridium Difficile by PCR     Status: Abnormal   Collection Time: 06/05/17  2:50 PM  Result Value Ref Range Status   Toxigenic C Difficile by pcr POSITIVE (A) NEGATIVE Final    Comment: Positive for toxigenic C. difficile with little to no toxin production. Only treat if clinical presentation suggests symptomatic illness.     Hospital Course: This is a 71 year old who came to the hospital because of chest pain. His chest pain was felt to be atypical. He had troponin levels of 0.3 but this was thought to be related to his renal failure. He did not show any EKG evidence of any sort of acute coronary syndrome. [name] known to have heart failure at baseline. He has something of a chronic elevation of troponin. He improved as far as his chest pain was concerned complained of diarrhea and was with equivocal results for C. difficile but he had clinical picture suggestive that he's been on multiple antibiotics so he was treated. He developed COPD exacerbation and that was treated. He was markedly better at the time of discharge  Discharge Exam: Blood pressure (!) 119/49, pulse (!) 103, temperature 98.9 F (37.2 C), temperature source Oral, resp. rate 18, height 6\' 1"  (1.854 m), weight 105.8 kg (233 lb 4 oz), SpO2 96 %. He's awake and alert. He looks comfortable. His chest is clear. He still has some mild chest wall pain. His abdomen is soft with no masses  Disposition: Home with home health services  Discharge Instructions    Diet - low sodium heart healthy    Complete by:  As directed    Increase activity slowly    Complete by:  As directed         Signed: Luchiano Viscomi L   06/08/2017, 8:27 AM

## 2017-06-08 NOTE — Care Management Important Message (Signed)
Important Message  Patient Details  Name: [name] [name] MRN: [id number] Date of Birth: Jan 10, 1946   Medicare Important Message Given:  Yes    Glen Blatchley, Chauncey Reading, RN 06/08/2017, 8:12 AM

## 2017-06-08 NOTE — Progress Notes (Signed)
Subjective: He says he feels better and wants to go home. No new complaints. His breathing is better. He has not had any diarrhea since he started on vancomycin.  Objective: Vital signs in last 24 hours: Temp:  [98 F (36.7 C)-98.9 F (37.2 C)] 98.9 F (37.2 C) (07/02 0601) Pulse Rate:  [82-103] 103 (07/02 0601) Resp:  [16-20] 18 (07/02 0601) BP: (118-130)/(46-54) 119/49 (07/02 0601) SpO2:  [96 %-100 %] 96 % (07/02 0601) Weight change:  Last BM Date: 06/06/17  Intake/Output from previous day: 07/01 0701 - 07/02 0700 In: 720 [P.O.:720] Out: 400 [Urine:400]  PHYSICAL EXAM General appearance: alert, cooperative and no distress Resp: clear to auscultation bilaterally Cardio: regular rate and rhythm, S1, S2 normal, no murmur, click, rub or gallop GI: soft, non-tender; bowel sounds normal; no masses,  no organomegaly Extremities: venous stasis dermatitis noted Skin warm and dry  Lab Results:  No results found for this or any previous visit (from the past 48 hour(s)).  ABGS No results for input(s): PHART, PO2ART, TCO2, HCO3 in the last 72 hours.  Invalid input(s): PCO2 CULTURES Recent Results (from the past 240 hour(s))  C difficile quick scan w PCR reflex     Status: Abnormal   Collection Time: 06/05/17  2:50 PM  Result Value Ref Range Status   C Diff antigen POSITIVE (A) NEGATIVE Final   C Diff toxin NEGATIVE NEGATIVE Final   C Diff interpretation Results are indeterminate. See PCR results.  Final  Clostridium Difficile by PCR     Status: Abnormal   Collection Time: 06/05/17  2:50 PM  Result Value Ref Range Status   Toxigenic C Difficile by pcr POSITIVE (A) NEGATIVE Final    Comment: Positive for toxigenic C. difficile with little to no toxin production. Only treat if clinical presentation suggests symptomatic illness.   Studies/Results: No results found.  Medications:  Prior to Admission:  Prescriptions Prior to Admission  Medication Sig Dispense Refill Last Dose   . acetaminophen (TYLENOL) 325 MG tablet Take 650 mg by mouth every 6 (six) hours as needed.   unknown  . amLODipine (NORVASC) 10 MG tablet Take 1 tablet (10 mg total) by mouth daily. 30 tablet 2 06/04/2017 at Unknown time  . calcitRIOL (ROCALTROL) 0.5 MCG capsule Take 0.5 mcg by mouth daily.   06/04/2017 at Unknown time  . cloNIDine (CATAPRES) 0.2 MG tablet TAKE (1) TABLET BY MOUTH (3) TIMES DAILY 90 tablet 0 06/04/2017 at Unknown time  . colchicine 0.6 MG tablet Take 1 tablet (0.6 mg total) by mouth daily. 60 tablet 2 06/04/2017 at Unknown time  . desmopressin (DDAVP) 0.2 MG tablet Take 1 tablet (0.2 mg total) by mouth 2 (two) times daily. 60 tablet 12 06/04/2017 at Unknown time  . dexamethasone (DECADRON) 1.5 MG tablet Take 1.5 mg by mouth daily.   06/04/2017 at Unknown time  . ipratropium-albuterol (DUONEB) 0.5-2.5 (3) MG/3ML SOLN Take 3 mLs by nebulization 2 (two) times daily. And additional every four hours as needed   06/04/2017 at Unknown time  . isosorbide-hydrALAZINE (BIDIL) 20-37.5 MG tablet Take 1 tablet by mouth 3 (three) times daily. 90 tablet 3 06/04/2017 at Unknown time  . levETIRAcetam (KEPPRA) 250 MG tablet Take 1 tablet (250 mg total) by mouth 2 (two) times daily. 60 tablet 3 06/04/2017 at 10-1200n  . levothyroxine (SYNTHROID, LEVOTHROID) 125 MCG tablet Take 125 mcg by mouth daily before breakfast.   06/04/2017 at Unknown time  . phenytoin (DILANTIN) 100 MG ER capsule Take 100  mg by mouth 3 (three) times daily.   06/04/2017 at 10-1200n  . polyvinyl alcohol (LIQUIFILM TEARS) 1.4 % ophthalmic solution Place 1 drop into both eyes 2 (two) times daily.   06/04/2017 at Unknown time  . potassium chloride (K-DUR,KLOR-CON) 20 MEQ tablet Take 1 tablet (20 mEq total) by mouth daily. 30 tablet 3 06/04/2017 at Unknown time  . predniSONE (DELTASONE) 10 MG tablet 4 tab po daily for 3 days, 3 tab po daily for 3 days, 2 tab po daily for 3 days, 1 tab po daily for 3 days. 30 tablet 0 06/04/2017 at Unknown time  .  testosterone cypionate (DEPOTESTOSTERONE CYPIONATE) 200 MG/ML injection Inject 200 mg into the muscle every 28 (twenty-eight) days.    Past Week at Unknown time  . torsemide (DEMADEX) 20 MG tablet Take 40 mg by mouth daily.   06/04/2017 at Unknown time   Scheduled: . amLODipine  10 mg Oral Daily  . calcitRIOL  0.5 mcg Oral Daily  . cloNIDine  0.2 mg Oral TID  . colchicine  0.6 mg Oral Daily  . desmopressin  0.2 mg Oral BID  . dexamethasone  1.5 mg Oral Daily  . enoxaparin (LOVENOX) injection  30 mg Subcutaneous Q24H  . guaiFENesin  1,200 mg Oral BID  . ipratropium-albuterol  3 mL Nebulization Q4H  . isosorbide-hydrALAZINE  1 tablet Oral TID  . levETIRAcetam  250 mg Oral BID  . levothyroxine  125 mcg Oral QAC breakfast  . methylPREDNISolone (SOLU-MEDROL) injection  40 mg Intravenous Q12H  . phenytoin  100 mg Oral TID  . polyvinyl alcohol  1 drop Both Eyes BID  . potassium chloride SA  20 mEq Oral Daily  . sodium chloride flush  3 mL Intravenous Q12H  . torsemide  40 mg Oral Daily  . vancomycin  125 mg Oral Q6H   Continuous: . sodium chloride     BSW:HQPRFF chloride, acetaminophen, loperamide, morphine injection, ondansetron (ZOFRAN) IV, sodium chloride flush  Assesment: He was admitted with chest pain. He ruled out for acute coronary syndrome. He has multiple other medical problems including COPD and he's had an acute COPD exacerbation. He had equivocal testing for C. difficile but has been treated and improved with treatment. 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 [name] much improved and ready for discharge Active Problems:   Hypothyroid   Seizure disorder ([place])   Chest pain   COPD with acute exacerbation ([place])    Plan: Discharge home today. I'm concerned that he'll have difficulty getting vancomycin so I'm going to put him on Flagyl for a week.    LOS: 2 days   [name] [name]  L 06/08/2017, 8:20 AM

## 2017-06-08 NOTE — Care Management Note (Signed)
Case Management Note  Patient Details  Name: [name] [name] MRN: [id number] Date of Birth: 07/31/1946   Expected Discharge Date:  06/08/17               Expected Discharge Plan:  [place]  In-House Referral:     Discharge planning Services  CM Consult  Post Acute Care Choice:  [place], Resumption of Svcs/PTA Provider Choice offered to:     DME Arranged:    DME Agency:     HH Arranged:    [place] Agency:  [facility] (now [facility])  Status of Service:  In process, will continue to follow  If discussed at Long Length of Stay Meetings, dates discussed:    Additional Comments: Patient discharging home today. [facility] [place] health aware and will obtain orders from chart for resumption of HH. No other CM needs.  Labrittany Wechter, Chauncey Reading, RN 06/08/2017, 1:08 PM

## 2017-06-08 NOTE — Progress Notes (Signed)
Pt IV and telemetry removed per NT, tolerated well.  Reviewed discharge instructions with pt and answered all questions at this time.

## 2017-06-09 ENCOUNTER — Other Ambulatory Visit: Payer: Self-pay | Admitting: *Deleted

## 2017-06-09 NOTE — Patient Outreach (Signed)
[name]) Care Management  06/09/2017  [name] [name] 1946-07-30 [id number]  [employer] referral -recent [name] discharge  06/08/2017:  Per hx patient has had greater than  5 [name] stays in last 6 months (2 stays in month of June 2018). Diagnoses noted in history include Systolic & Diastolic CHF, CKD 3, HTN, COPD, Seizure disorder, Panhypopituitarism.  Telephone call to patient who was advised of reason for call & [name] [name] care management services. HIPPA verification received from patient.   Patient states most recent [name] stay was for chest pain & COPD flair up.  States he quit smoking over 10 years ago and uses home oxygen at 2 liters if he has difficulty breathing. States Dr. Luan [name] is his lung specialist. States he was told by [name] [name] that they would no longer be able to maintain his oxygen because they were not in network with [employer].   Patient voices that he takes over 15 medications & uses [name] [name] to get prescriptions filled. States he manages his own mediations & takes them as instructed.  Patient voices that he also has heart failure & has heart specialist.  States he has scales & weighs self.  Patient states he is feeling fair since discharged. States he lives alone & uses walker to get around. States independent with his care. Patient voices that he has home health services ordered but they haven't started yet.  Patient voices that he did receive discharge instructions but was not aware that he needed to make appointment to see primary care provider within 2 weeks of [name] discharge. Encouraged patient to call to make appointment & advised of the importance of doing so.   Patient appropriate for [name] [place] [facility] services for transition of care, chronic disease management, care coordination. Patient has multiple diagnoses, is falls risk, taking over 15 medications.     Patient consents to [facility] care management services.   Plan: Refer to [facility] for complex case management. Refer to Pharmacist for taking over 15 medications.   Sherrin Daisy, RN BSN [place] Management Coordinator [name] [name] Care Management  [phone]

## 2017-06-09 NOTE — Patient Outreach (Signed)
[facility]) Care Management  06/09/2017  [name] [name] 06/11/1946 [id number]  [name] [name] is a 71 year old gentleman who was discharged from the hospital to home with home health services on 06/08/17 after an admission wherein he was treated for chest pain and COPD exacerbation. [name] [name] has a medical history which includes hypothyroidism, panhypopituitarism, seizure disorder, diabetes mellitus type II, hypertension, congestive heart failure, and COPD with chronic respiratory failure. During his hospitalization, [name] [name] complained of abdominal pain and exhibited diarrhea. His CDiff workup was equivocal and he had recently been treated with antibiotics, so he was treated for Cdiff infection while in the hospital.   [name] [name] was referred to [place] [facility] by [facility]. He has had 7 hospital admissons in the last 6 months with 2 of those being in the month of June. Per [name] [name] own report, most of these admissions were for COPD exacerbations and related respiratory illness.   Primary Care Provider: Dr. [name] Pulmonary Provider: Dr. Sinda Du (post hospital follow up appointment has not been made) Cardiology Provider: Dr. Carlyle Dolly Pharmacy: [facility] (on 15 medications; [facility] Pharmacist consulted)  [name] [name] initial transition of care evaluation was performed by Sherrin Daisy RN, [facility] Care Management Telephonic Case Manager. Mrs. Tammi Klippel referred [name] [name] to the community case management program for continued transition of care assessments, chronic disease management, care coordination, falls risk management, and medication management.     Plan: I will reach out to [name] [name] by phone on Thursday, 06/11/17. I will inquire as to whether his home health services were initiated, whether he has scheduled a provider appointment with either his primary care provider or his pulmonologist, and to schedule a home visit.    [place]  Management  [phone]

## 2017-06-11 ENCOUNTER — Other Ambulatory Visit: Payer: Self-pay | Admitting: *Deleted

## 2017-06-11 NOTE — Patient Outreach (Signed)
[facility]) Care Management  06/11/2017  [name] [name] 1946-03-02 [id number]  [name] is a 71 year old gentleman who was discharged from the hospital on 06/08/17 after an admission for treatment chest pain, CDiff, and COPD exacerbation.   [name] was referred to [place] [facility] for transition of care assessments, fall risk assessment and management, care coordination, and disease management related to COPD.   I was unable to reach him today when I called and was unable to leave a voice message.    Plan: I will reach out to [name] by phone again tomorrow.    [place] [facility]  [phone]

## 2017-06-12 ENCOUNTER — Other Ambulatory Visit: Payer: Self-pay | Admitting: *Deleted

## 2017-06-12 ENCOUNTER — Telehealth: Payer: Self-pay | Admitting: Pharmacist

## 2017-06-12 NOTE — Patient Outreach (Signed)
[facility]) Care Management  06/12/2017  [name] [name] Oct 30, 1946 [id number]   Unsuccessful telephone outreach to [name], 71 year old male referred to [place] from [facility] telephonic RN CM for medication review.    Patient has history including, but not limited to, seizures, HTN, chronic kidney disease stage IV s/p right nephrectomy 2010, heart failure (EF 45% on 02/16/17), pituitary adenoma s/p resection causing panhypopituitarism, secondary hypothyroidism, diabetes insipidus, aortic insufficiency, COPD on 2L home oxygen, gout, tobacco abuse in remission, pulmonary HTN, history of GIB, HLD, and obesity with recent hospitalization 06/04/17 - 06/08/17 for chest pain, clostridium difficile, and COPD exacerbation.  Patient discharged home with home health services.   HIPAA compliant voice mail message left for patient, requesting return call back.  Plan:  Will re-attempt [facility] pharmacy telephone outreach next week if I do not hear back from patient first.  [name], PharmD, [place] [phone]

## 2017-06-12 NOTE — Patient Outreach (Signed)
[facility]) Care Management  06/12/2017  [name] [name] 1946/08/27 [id number]  I was unable to reach [name] by phone today and was unable to leave a message.   Plan: I will call [name] again next week.    [place] Management  [phone]

## 2017-06-15 ENCOUNTER — Ambulatory Visit: Payer: Self-pay | Admitting: Pharmacist

## 2017-06-15 ENCOUNTER — Telehealth: Payer: Self-pay | Admitting: Pharmacist

## 2017-06-15 NOTE — Patient Outreach (Addendum)
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  Patient discharged home with home health services.   Subjective: Successful outreach phone-call to [name] [name], 71 year old male referred to [place] from [facility] telephonic RN CM for medication review.  HIPAA identifiers verified.  Patient was very hard of hearing during conversation and often needed to be asked questions several times before he responded.  Patient stated he used a pillbox for his medications and that [facility] delivered his medications to him monthly.  He was agreeable to learning more about blisterpack services as he is taking >15 medications with various dosing frequencies.  Patient stated he usually takes his blood pressure once a day and that it "goes up and down."  Patient took blood pressure during our phone-call and stated it was "148/48."  Patient stated he had a seizure at home 2 weeks ago and that "it didn't last that long."  He did not call any of his providers or go to ER  He describes his seizures as occurring "every once in a while."    Objective:  Recent labs:  05/08/2017: phenytoin level = 14.5 (albumin 4.0, SCr = 2.7 - no need to correct level)  Encounter Medications: Outpatient Encounter Prescriptions as of 06/15/2017  Medication Sig  . acetaminophen (TYLENOL) 325 MG tablet Take 650 mg by mouth every 6 (six) hours as needed for mild pain.   Marland Kitchen  amLODipine (NORVASC) 10 MG tablet Take 1 tablet (10 mg total) by mouth daily.  . calcitRIOL (ROCALTROL) 0.5 MCG capsule Take 0.5 mcg by mouth daily.  . cloNIDine (CATAPRES) 0.2 MG tablet TAKE (1) TABLET BY MOUTH (3) TIMES DAILY  . colchicine 0.6 MG tablet Take 1 tablet (0.6 mg total) by mouth daily.  Marland Kitchen desmopressin (DDAVP) 0.2 MG tablet Take 1 tablet (0.2 mg total) by mouth 2 (two) times daily.  Marland Kitchen ipratropium-albuterol (DUONEB) 0.5-2.5 (3) MG/3ML SOLN Take 3 mLs by nebulization 2 (two) times daily. And additional every four hours as needed  . isosorbide-hydrALAZINE (BIDIL) 20-37.5 MG tablet Take 1 tablet by mouth 3 (three) times daily.  Marland Kitchen levETIRAcetam (KEPPRA) 250 MG tablet Take 1 tablet (250 mg total) by mouth 2 (two) times daily.  Marland Kitchen levothyroxine (SYNTHROID, LEVOTHROID) 125 MCG tablet Take 125 mcg by mouth daily before breakfast.  . phenytoin (DILANTIN) 100 MG ER capsule Take 100 mg by mouth 3 (three) times daily.  . polyvinyl alcohol (LIQUIFILM TEARS) 1.4 % ophthalmic solution Place 1 drop into both eyes 2 (two) times daily.  . potassium chloride (K-DUR,KLOR-CON) 20 MEQ tablet Take 1 tablet (20 mEq total) by mouth daily.  Marland Kitchen testosterone cypionate (DEPOTESTOSTERONE CYPIONATE) 200 MG/ML injection Inject 200 mg into the muscle every 28 (twenty-eight) days.   Marland Kitchen torsemide (DEMADEX) 20 MG tablet Take 40 mg by mouth daily.  Marland Kitchen dexamethasone (DECADRON) 1.5 MG tablet Take 1.5 mg by mouth daily.  . metroNIDAZOLE (FLAGYL) 500 MG tablet Take 1 tablet (500 mg total) by mouth 3 (three) times daily.  . predniSONE (DELTASONE) 10 MG  tablet 4 tab po daily for 3 days, 3 tab po daily for 3 days, 2 tab po daily for 3 days, 1 tab po daily for 3 days. (Patient not taking: Reported on 06/15/2017)   No facility-administered encounter medications on file as of 06/15/2017.     Functional Status: In your present state of health, do you have any difficulty performing the following activities: 06/09/2017 06/05/2017  Hearing? N  N  Vision? N N  Difficulty concentrating or making decisions? N N  Walking or climbing stairs? N N  Dressing or bathing? N N  Doing errands, shopping? N N  Some recent data might be hidden    Fall/Depression Screening: Fall Risk  06/09/2017 09/30/2016 09/14/2015  Falls in the past year? Yes No No  Number falls in past yr: 1 - -  Injury with Fall? No - -  Risk for fall due to : History of fall(s);Impaired mobility - -  Risk for fall due to (comments): patient states using walker since recent hospital stay - -   [facility] 2/9 Scores 06/09/2017 09/30/2016 09/14/2015 09/14/2015  PHQ - 2 Score 0 0 0 0      Assessment:  Drugs sorted by system:  Neurologic/Psychologic: levetiracetam, phenytoin  Cardiovascular: amlodipine, clonidine, isosorbide dinitrate-hydralazine, torsemide, potassium  Pulmonary/Allergy: ipratropium-albuterol nebulizers  Endocrine: desmopressin, dexamethasone, levothyroxine  Renal: calcitriol  Topical: polyvinyl alcohol eye drops  Pain: acetaminophen  Infectious Diseases: metronidazole  Miscellaneous: colchicine, testosterone injection  Other issues noted:  1.  Patient reports seizure two weeks ago.  Currently taking two anticonvulsants: phenytoin and levetiracetam.  Please address as clinically warranted.    2.  Chronic kidney disease stage IV: CrCl 27 ml/min  Desmopressin: use is contraindicated with CrCl < 50 ml/min  Colchicine for gout prophylaxis: Per package insert, dosing for CrCl < 30 ml/min: Initial dose: 0.3mg  daily; use caution if dose titrated; monitor for adverse effects.    Patient currently taking colchicine 0.6mg  daily.  CrCl borderline to adjust dose.  Please address as clinically warranted.    3.  Removed prednisone taper from medication list as patient no longer taking  4.  Patient does not seem familiar with the names or indications of any of his medications.  Unclear if he is adherent.  Did not know if he was taking metolazone or  simvastatin.  Patient is agreeable to learn more about blister-packing services.  [employer] offers this service for free for patients.  They will call patient to review program and set up with him.    5.  Low blood pressure 148/48 per patient report today.     6.  Diagnosis of heart failure however no ACEi/ARB per providers notes due to CKD-IV.  Previously on low-dose coreg but not currently on any beta blocker, possibly due to severe COPD.    Plan: Route note to provider regarding issues above.  Follow up with patient later this week regarding blister-pack services.   [name], PharmD, [place] [phone]

## 2017-06-16 ENCOUNTER — Encounter (HOSPITAL_COMMUNITY): Payer: Self-pay | Admitting: Emergency Medicine

## 2017-06-16 ENCOUNTER — Other Ambulatory Visit: Payer: Self-pay | Admitting: *Deleted

## 2017-06-16 ENCOUNTER — Emergency Department (HOSPITAL_COMMUNITY)
Admission: EM | Admit: 2017-06-16 | Discharge: 2017-06-16 | Disposition: A | Discharge status: Home Health | Payer: Medicare HMO | Attending: Emergency Medicine | Admitting: Emergency Medicine

## 2017-06-16 DIAGNOSIS — I129 Hypertensive chronic kidney disease with stage 1 through stage 4 chronic kidney disease, or unspecified chronic kidney disease: Secondary | ICD-10-CM | POA: Insufficient documentation

## 2017-06-16 DIAGNOSIS — E039 Hypothyroidism, unspecified: Secondary | ICD-10-CM | POA: Diagnosis not present

## 2017-06-16 DIAGNOSIS — Z79899 Other long term (current) drug therapy: Secondary | ICD-10-CM | POA: Insufficient documentation

## 2017-06-16 DIAGNOSIS — E119 Type 2 diabetes mellitus without complications: Secondary | ICD-10-CM | POA: Insufficient documentation

## 2017-06-16 DIAGNOSIS — I2510 Atherosclerotic heart disease of native coronary artery without angina pectoris: Secondary | ICD-10-CM | POA: Insufficient documentation

## 2017-06-16 DIAGNOSIS — N184 Chronic kidney disease, stage 4 (severe): Secondary | ICD-10-CM | POA: Insufficient documentation

## 2017-06-16 DIAGNOSIS — R197 Diarrhea, unspecified: Secondary | ICD-10-CM | POA: Diagnosis not present

## 2017-06-16 DIAGNOSIS — Z87891 Personal history of nicotine dependence: Secondary | ICD-10-CM | POA: Insufficient documentation

## 2017-06-16 DIAGNOSIS — J449 Chronic obstructive pulmonary disease, unspecified: Secondary | ICD-10-CM | POA: Insufficient documentation

## 2017-06-16 LAB — COMPREHENSIVE METABOLIC PANEL
ALBUMIN: 4.3 g/dL (ref 3.5–5.0)
ALK PHOS: 45 U/L (ref 38–126)
ALT: 18 U/L (ref 17–63)
ANION GAP: 12 (ref 5–15)
AST: 21 U/L (ref 15–41)
BUN: 72 mg/dL — ABNORMAL HIGH (ref 6–20)
CALCIUM: 9.2 mg/dL (ref 8.9–10.3)
CHLORIDE: 92 mmol/L — AB (ref 101–111)
CO2: 31 mmol/L (ref 22–32)
CREATININE: 3.54 mg/dL — AB (ref 0.61–1.24)
GFR calc non Af Amer: 16 mL/min — ABNORMAL LOW (ref >60)
GFR, EST AFRICAN AMERICAN: 18 mL/min — AB (ref >60)
GLUCOSE: 85 mg/dL (ref 65–99)
Potassium: 3.3 mmol/L — ABNORMAL LOW (ref 3.5–5.1)
SODIUM: 135 mmol/L (ref 135–145)
Total Bilirubin: 0.8 mg/dL (ref 0.3–1.2)
Total Protein: 8 g/dL (ref 6.5–8.1)

## 2017-06-16 LAB — CBC WITH DIFFERENTIAL/PLATELET
Basophils Absolute: 0.1 10*3/uL (ref 0.0–0.1)
Basophils Relative: 1 %
Eosinophils Absolute: 0.8 10*3/uL — ABNORMAL HIGH (ref 0.0–0.7)
Eosinophils Relative: 14 %
HEMATOCRIT: 36.9 % — AB (ref 39.0–52.0)
HEMOGLOBIN: 12 g/dL — AB (ref 13.0–17.0)
LYMPHS ABS: 1.2 10*3/uL (ref 0.7–4.0)
Lymphocytes Relative: 20 %
MCH: 30.2 pg (ref 26.0–34.0)
MCHC: 32.5 g/dL (ref 30.0–36.0)
MCV: 92.9 fL (ref 78.0–100.0)
MONO ABS: 0.6 10*3/uL (ref 0.1–1.0)
MONOS PCT: 11 %
NEUTROS ABS: 3.3 10*3/uL (ref 1.7–7.7)
NEUTROS PCT: 54 %
Platelets: 167 10*3/uL (ref 150–400)
RBC: 3.97 MIL/uL — ABNORMAL LOW (ref 4.22–5.81)
RDW: 14.8 % (ref 11.5–15.5)
WBC: 6 10*3/uL (ref 4.0–10.5)

## 2017-06-16 MED ORDER — ALBUTEROL SULFATE (2.5 MG/3ML) 0.083% IN NEBU
5.0000 mg | INHALATION_SOLUTION | Freq: Once | RESPIRATORY_TRACT | Status: AC
  Administered 2017-06-16: 5 mg via RESPIRATORY_TRACT
  Filled 2017-06-16: qty 6

## 2017-06-16 MED ORDER — SODIUM CHLORIDE 0.9 % IV BOLUS (SEPSIS)
1000.0000 mL | Freq: Once | INTRAVENOUS | Status: AC
  Administered 2017-06-16: 1000 mL via INTRAVENOUS

## 2017-06-16 MED ORDER — LOPERAMIDE HCL 2 MG PO CAPS: 2.0000 mg | ORAL_CAPSULE | Freq: Four times a day (QID) | ORAL | 0 refills | Status: DC | PRN

## 2017-06-16 NOTE — ED Triage Notes (Signed)
Pt reports continuing to have diarrhea.  Pt states he was in the hospital not long ago for same.  Appears to have been c diff positive on last admission.

## 2017-06-16 NOTE — ED Notes (Signed)
Pt states that he is not having pain at this time and cannot go to the bathroom for stool sample at this time.

## 2017-06-16 NOTE — Patient Outreach (Signed)
[facility]) Care Management  06/16/2017  ZANDON TALTON March 13, 1946 [id number]  Mr. Haverland was referred to [place] Management for transition of care assessments, fall risk assessment and management, care coordination, and disease management related to COPD.   I have tried to reach Mr. Fung unsuccessfully on 3 separate occasions. However, our [place] [facility] clinical pharmacist [name] PharmD, BCPS was able to reach Mr. Ausmus yesterday and performed a detailed interview (see note). When I attempted to reach out to Mr. Mcconathy today, I noted that he was in the ED with complaint of diarrhea. I called Mr. Francesco Runner home number late in the day but again was unable to reach him. I left a HIPPA compliant message requesting a return call.   Plan: I will continue to try to make contact with Mr. Dubie in an effort to establish care with him. Our care management team is compromised of an advanced practice nurse, PharmD, and Licensed Clinical Social Worker. Our services are free to Mr. Pare and we are happy to engage with him and see him at home to see what we might be able to do to help him best manage his health care needs.    [place] Management  [phone]
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
## 2017-06-16 NOTE — Discharge Instructions (Signed)
As discussed, your evaluation today has been largely reassuring.  But, it is important that you monitor your condition carefully, and do not hesitate to return to the ED if you develop new, or concerning changes in your condition. ? ?Otherwise, please follow-up with your physician for appropriate ongoing care. ? ?

## 2017-06-18 ENCOUNTER — Other Ambulatory Visit: Payer: Self-pay | Admitting: Cardiovascular Disease

## 2017-06-19 ENCOUNTER — Other Ambulatory Visit: Payer: Self-pay | Admitting: Pharmacist

## 2017-06-19 ENCOUNTER — Ambulatory Visit: Payer: Self-pay | Admitting: Pharmacist

## 2017-06-19 NOTE — Patient Outreach (Signed)
[facility]) Care Management  06/19/2017  [name] [name] 06-Feb-1946 [id number]   Unsuccessful telephone outreach to [name] [name] this afternoon.    Noted patient had ED visit on 7/10 with chief complaint of diarrhea.  Per ED notes, low suspicion for refractory clostridium difficile colitis, no change to medications on discharge, patient to continue treatment with metronidazole.    HIPAA compliant voice mail message left for patient, requesting return call back.  Plan:  Will re-attempt [facility] pharmacy telephone outreach next week if I do not hear back from patient first.  [name], PharmD, [place] [phone]

## 2017-06-22 ENCOUNTER — Other Ambulatory Visit: Payer: Self-pay | Admitting: *Deleted

## 2017-06-22 NOTE — Patient Outreach (Signed)
[facility]) Care Management  06/22/2017  JAYMON DUDEK May 15, 1946 [id number]  Unable to reach Mr. Pepperman by phone today.  Plan: I will attempt to contact him by phone again tomorrow.    [facility] Management  [phone]

## 2017-06-23 ENCOUNTER — Ambulatory Visit: Payer: Self-pay | Admitting: Pharmacist

## 2017-06-23 ENCOUNTER — Other Ambulatory Visit: Payer: Self-pay | Admitting: Pharmacist

## 2017-06-23 ENCOUNTER — Other Ambulatory Visit: Payer: Self-pay | Admitting: *Deleted

## 2017-06-23 NOTE — Patient Outreach (Signed)
[facility]) Care Management  06/23/2017  [name] [name] 1946-06-29 [id number]  Unsuccessful outreach phone-call #2 to patient this morning.  Left HIPAA compliant voicemail.    Spoke with pharmacist from [employer].  They have trying to call patient for the last week regarding blister-packing services but have not been able to reach patient.    Left message with Dr. Josephine Cables office regarding patient as I have not heard back regarding medication issues route to provider on 06/15/2017.    Plan: Reach out to patient again on Friday unless I hear back from patient beforehand.  Await call back from provider office.   [name], PharmD, [place] [phone]

## 2017-06-23 NOTE — Patient Outreach (Signed)
[facility]) Care Management  06/23/2017  LEJUAN BOTTO 1946-02-06 [id number]  Mr. Woolverton was referred to [facility] upon discharge from the hospital on 06/08/17. Our telephonic case manager, Sherrin Daisy RN BSN CCM was able to reach Mr. Trentman by phone. At that time, he agreed to engagement with [facility]. Our pharmacist has been able to make contact with Mr. Swift one one occasion. However, I have been unable to reach Mr. Axelson by phone on at least 4 separate phone contact attempts (different days).   Plan: I will send a letter to Mr. Cheuvront outlining the services we may be able to provide to him should he wish to engage with [place]. I will also notify his provider via electronic communication of our efforts thus far. I will place another call to Mr.Standiford in 10 days if I do not receive return communication from him or his designee.    [place] Management  [phone]

## 2017-06-26 ENCOUNTER — Telehealth: Payer: Self-pay | Admitting: Pharmacist

## 2017-06-26 ENCOUNTER — Ambulatory Visit: Payer: Self-pay | Admitting: Pharmacist

## 2017-06-26 NOTE — Patient Outreach (Signed)
[facility]) Care Management  06/26/2017  [name] [name] Sep 29, 1946 [id number]   Discussed Mr. Wieck with Dr. Josephine Cables nurse and reviewed medication issues noted during my initial telephonic conversation with patient.  Provider's office is aware of [facility]'s efforts to assist patient.  They will reach out to Mr. Mim's with any recommendations from Dr. Legrand Rams in regards to medication concerns.      Third unsuccessful outreach attempt to Mr. Buehl this afternoon.  Left HIPAA compliant voicemail.   Plan: I will send patient a letter describing [facility] services and follow-up in 10 business days.  Route note to PCP.    [name], PharmD, [place] [phone]

## 2017-07-02 ENCOUNTER — Encounter: Payer: Self-pay | Admitting: "Endocrinology

## 2017-07-02 ENCOUNTER — Ambulatory Visit: Payer: Medicare HMO | Admitting: "Endocrinology

## 2017-07-03 ENCOUNTER — Other Ambulatory Visit: Payer: Self-pay | Admitting: *Deleted

## 2017-07-03 NOTE — Patient Outreach (Signed)
[facility]) Care Management  07/03/2017  [name] [name] 08-15-1946 [id number]  [name] was referred to [facility] upon discharge from the hospital on 06/08/17. Our telephonic case manager, Sherrin Daisy RN BSN CCM was able to reach [name] by phone. At that time, he agreed to engagement with [facility]. Our pharmacist has been able to make contact with [name] one one occasion. However, I have been unable to reach [name] by phone on at least 4 separate phone contact attempts (different days).   Ten days ago, I sent a letter to [name] outlining the services we could offer to him should he wish to engage with [place]. I also notified his provider via electronic communication of our efforts thus far as did Dr. [name] [facility] pharmacist).   Plan: I closed [name]' case today and will notify his primary care provider. We are happy to help [name] with any case management needs he may have in the future should he be interested in services.    [facility] Management  [phone]

## 2017-07-06 ENCOUNTER — Other Ambulatory Visit: Payer: Self-pay | Admitting: "Endocrinology

## 2017-07-09 ENCOUNTER — Emergency Department (HOSPITAL_COMMUNITY): Payer: Medicare HMO

## 2017-07-09 ENCOUNTER — Emergency Department (HOSPITAL_COMMUNITY)
Admission: EM | Admit: 2017-07-09 | Discharge: 2017-07-09 | Disposition: A | Discharge status: Home / Self Care | Payer: Medicare HMO | Attending: Emergency Medicine | Admitting: Emergency Medicine

## 2017-07-09 ENCOUNTER — Encounter (HOSPITAL_COMMUNITY): Payer: Self-pay | Admitting: *Deleted

## 2017-07-09 DIAGNOSIS — R197 Diarrhea, unspecified: Secondary | ICD-10-CM | POA: Diagnosis not present

## 2017-07-09 DIAGNOSIS — I270 Primary pulmonary hypertension: Secondary | ICD-10-CM | POA: Diagnosis not present

## 2017-07-09 DIAGNOSIS — N189 Chronic kidney disease, unspecified: Secondary | ICD-10-CM | POA: Diagnosis not present

## 2017-07-09 DIAGNOSIS — Z79899 Other long term (current) drug therapy: Secondary | ICD-10-CM | POA: Diagnosis not present

## 2017-07-09 DIAGNOSIS — R1084 Generalized abdominal pain: Secondary | ICD-10-CM | POA: Insufficient documentation

## 2017-07-09 DIAGNOSIS — J449 Chronic obstructive pulmonary disease, unspecified: Secondary | ICD-10-CM | POA: Insufficient documentation

## 2017-07-09 DIAGNOSIS — Z87891 Personal history of nicotine dependence: Secondary | ICD-10-CM | POA: Insufficient documentation

## 2017-07-09 DIAGNOSIS — E1122 Type 2 diabetes mellitus with diabetic chronic kidney disease: Secondary | ICD-10-CM | POA: Diagnosis not present

## 2017-07-09 DIAGNOSIS — N184 Chronic kidney disease, stage 4 (severe): Secondary | ICD-10-CM | POA: Diagnosis not present

## 2017-07-09 DIAGNOSIS — E039 Hypothyroidism, unspecified: Secondary | ICD-10-CM | POA: Insufficient documentation

## 2017-07-09 DIAGNOSIS — R109 Unspecified abdominal pain: Secondary | ICD-10-CM | POA: Diagnosis not present

## 2017-07-09 DIAGNOSIS — Z905 Acquired absence of kidney: Secondary | ICD-10-CM | POA: Insufficient documentation

## 2017-07-09 LAB — CBC WITH DIFFERENTIAL/PLATELET
BASOS PCT: 1 %
Basophils Absolute: 0.1 10*3/uL (ref 0.0–0.1)
Eosinophils Absolute: 0.9 10*3/uL — ABNORMAL HIGH (ref 0.0–0.7)
Eosinophils Relative: 11 %
HCT: 33.1 % — ABNORMAL LOW (ref 39.0–52.0)
Hemoglobin: 11 g/dL — ABNORMAL LOW (ref 13.0–17.0)
Lymphocytes Relative: 12 %
Lymphs Abs: 1 10*3/uL (ref 0.7–4.0)
MCH: 30.8 pg (ref 26.0–34.0)
MCHC: 33.2 g/dL (ref 30.0–36.0)
MCV: 92.7 fL (ref 78.0–100.0)
MONO ABS: 0.7 10*3/uL (ref 0.1–1.0)
MONOS PCT: 9 %
NEUTROS ABS: 5.4 10*3/uL (ref 1.7–7.7)
Neutrophils Relative %: 67 %
PLATELETS: 150 10*3/uL (ref 150–400)
RBC: 3.57 MIL/uL — AB (ref 4.22–5.81)
RDW: 14.3 % (ref 11.5–15.5)
WBC: 8.1 10*3/uL (ref 4.0–10.5)

## 2017-07-09 LAB — C DIFFICILE QUICK SCREEN W PCR REFLEX
C DIFFICILE (CDIFF) TOXIN: NEGATIVE
C Diff antigen: NEGATIVE
C Diff interpretation: NOT DETECTED

## 2017-07-09 LAB — COMPREHENSIVE METABOLIC PANEL
ALBUMIN: 3.9 g/dL (ref 3.5–5.0)
ALK PHOS: 37 U/L — AB (ref 38–126)
ALT: 21 U/L (ref 17–63)
ANION GAP: 9 (ref 5–15)
AST: 19 U/L (ref 15–41)
BUN: 47 mg/dL — AB (ref 6–20)
CALCIUM: 9.1 mg/dL (ref 8.9–10.3)
CO2: 21 mmol/L — AB (ref 22–32)
CREATININE: 2.65 mg/dL — AB (ref 0.61–1.24)
Chloride: 109 mmol/L (ref 101–111)
GFR calc Af Amer: 26 mL/min — ABNORMAL LOW (ref >60)
GFR calc non Af Amer: 23 mL/min — ABNORMAL LOW (ref >60)
GLUCOSE: 76 mg/dL (ref 65–99)
Potassium: 4.3 mmol/L (ref 3.5–5.1)
SODIUM: 139 mmol/L (ref 135–145)
Total Bilirubin: 0.9 mg/dL (ref 0.3–1.2)
Total Protein: 7.6 g/dL (ref 6.5–8.1)

## 2017-07-09 LAB — LIPASE, BLOOD: LIPASE: 49 U/L (ref 11–51)

## 2017-07-09 LAB — TROPONIN I: TROPONIN I: 0.03 ng/mL — AB (ref <0.03)

## 2017-07-09 MED ORDER — POTASSIUM CHLORIDE CRYS ER 20 MEQ PO TBCR
40.0000 meq | EXTENDED_RELEASE_TABLET | Freq: Once | ORAL | Status: AC
Start: 2017-07-09 — End: 2017-07-09
  Administered 2017-07-09: 40 meq via ORAL
  Filled 2017-07-09: qty 2

## 2017-07-09 MED ORDER — ACETAMINOPHEN 500 MG PO TABS
1000.0000 mg | ORAL_TABLET | Freq: Once | ORAL | Status: AC
  Administered 2017-07-09: 1000 mg via ORAL
  Filled 2017-07-09: qty 2

## 2017-07-09 MED ORDER — VANCOMYCIN 50 MG/ML ORAL SOLUTION
ORAL | Status: AC
  Filled 2017-07-09: qty 5

## 2017-07-09 MED ORDER — VANCOMYCIN HCL 125 MG PO CAPS: 125.0000 mg | ORAL_CAPSULE | Freq: Four times a day (QID) | ORAL | 0 refills | Status: DC

## 2017-07-09 MED ORDER — VANCOMYCIN 50 MG/ML ORAL SOLUTION
125.0000 mg | Freq: Once | ORAL | Status: AC
  Administered 2017-07-09: 125 mg via ORAL
  Filled 2017-07-09: qty 2.5

## 2017-07-09 MED ORDER — IOPAMIDOL (ISOVUE-300) INJECTION 61%
INTRAVENOUS | Status: AC
  Filled 2017-07-09: qty 30

## 2017-07-09 MED ORDER — VANCOMYCIN 50 MG/ML ORAL SOLUTION: 125.0000 mg | Freq: Four times a day (QID) | ORAL | Status: DC

## 2017-07-09 MED ORDER — SODIUM CHLORIDE 0.9 % IV BOLUS (SEPSIS)
1000.0000 mL | Freq: Once | INTRAVENOUS | Status: AC
  Administered 2017-07-09: 1000 mL via INTRAVENOUS

## 2017-07-09 NOTE — ED Notes (Signed)
Pt states he continues to have diarrhea after eating.  Has had this for several months.

## 2017-07-09 NOTE — ED Notes (Signed)
Date and time results received: 07/09/17 1933 (use smartphrase ".now" to insert current time)  Test: Troponin Critical Value: 0.03  Name of Provider Notified: Jacubowitz  Orders Received? Or Actions Taken?none
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
## 2017-07-09 NOTE — ED Notes (Signed)
ED Provider at bedside. 

## 2017-07-09 NOTE — ED Triage Notes (Signed)
Abdominal pain

## 2017-07-09 NOTE — ED Notes (Signed)
Patient transported to CT 

## 2017-07-09 NOTE — ED Notes (Signed)
Pt daughter, [name], can be reached at [phone]

## 2017-07-09 NOTE — Discharge Instructions (Signed)
Take Imodium as directed for diarrhea. Make sure that you drink at least six 8 ounce glasses of water each day in order to stay well-hydrated. Take Tylenol as directed for pain. Call Fanta to arrange to be seen in his office if continuing to have diarrhea or not feeling better by next week.

## 2017-07-10 ENCOUNTER — Ambulatory Visit: Payer: Self-pay | Admitting: Pharmacist

## 2017-07-10 ENCOUNTER — Other Ambulatory Visit: Payer: Self-pay | Admitting: Pharmacist

## 2017-07-10 NOTE — Patient Outreach (Signed)
[facility]) Care Management  07/10/2017  [name] [name] [birth year] [id number]   71 year old male referred to [facility] Management upon discharge from the hospital on 06/08/17.  I was able to speak with [name] [name] on 06/15/2017 and we reviewed his medications together telephonically.  Patient did not answer or return any further phone-call attempts to follow-up medication concerns.  I sent patient a letter on 06/26/2017 describing [facility] services should he wish to engage with [place].  I also spoke with Dr. Josephine Cables office regarding medication concerns and they are aware of our attempts to reach patient.    Plan: I will close [name] [name] case today and notify Dr. Legrand Rams. I am happy to assist in the future if patient is interested in [facility] services.   [name], PharmD, [place] [phone]

## 2017-07-15 LAB — STOOL CULTURE: E COLI SHIGA TOXIN ASSAY: NEGATIVE

## 2017-07-15 LAB — STOOL CULTURE REFLEX - CMPCXR

## 2017-07-15 LAB — STOOL CULTURE REFLEX - RSASHR

## 2017-07-22 ENCOUNTER — Encounter: Payer: Self-pay | Admitting: Internal Medicine

## 2017-07-30 DIAGNOSIS — N189 Chronic kidney disease, unspecified: Secondary | ICD-10-CM | POA: Diagnosis not present

## 2017-07-30 DIAGNOSIS — I10 Essential (primary) hypertension: Secondary | ICD-10-CM | POA: Diagnosis not present

## 2017-07-30 DIAGNOSIS — E785 Hyperlipidemia, unspecified: Secondary | ICD-10-CM | POA: Diagnosis not present

## 2017-07-30 DIAGNOSIS — N184 Chronic kidney disease, stage 4 (severe): Secondary | ICD-10-CM | POA: Diagnosis not present

## 2017-07-30 DIAGNOSIS — I5022 Chronic systolic (congestive) heart failure: Secondary | ICD-10-CM | POA: Diagnosis not present

## 2017-07-30 DIAGNOSIS — M189 Osteoarthritis of first carpometacarpal joint, unspecified: Secondary | ICD-10-CM | POA: Diagnosis not present

## 2017-07-30 DIAGNOSIS — G4730 Sleep apnea, unspecified: Secondary | ICD-10-CM | POA: Diagnosis not present

## 2017-07-30 DIAGNOSIS — I2510 Atherosclerotic heart disease of native coronary artery without angina pectoris: Secondary | ICD-10-CM | POA: Diagnosis not present

## 2017-07-30 DIAGNOSIS — E237 Disorder of pituitary gland, unspecified: Secondary | ICD-10-CM | POA: Diagnosis not present

## 2017-07-30 DIAGNOSIS — Z1389 Encounter for screening for other disorder: Secondary | ICD-10-CM | POA: Diagnosis not present

## 2017-07-30 DIAGNOSIS — E230 Hypopituitarism: Secondary | ICD-10-CM | POA: Diagnosis not present

## 2017-07-30 DIAGNOSIS — J9611 Chronic respiratory failure with hypoxia: Secondary | ICD-10-CM | POA: Diagnosis not present

## 2017-07-30 DIAGNOSIS — J449 Chronic obstructive pulmonary disease, unspecified: Secondary | ICD-10-CM | POA: Diagnosis not present

## 2017-07-30 DIAGNOSIS — D497 Neoplasm of unspecified behavior of endocrine glands and other parts of nervous system: Secondary | ICD-10-CM | POA: Diagnosis not present

## 2017-08-04 ENCOUNTER — Emergency Department (HOSPITAL_COMMUNITY): Payer: Medicare HMO

## 2017-08-04 ENCOUNTER — Inpatient Hospital Stay (HOSPITAL_COMMUNITY)
Admission: EM | Admit: 2017-08-04 | Discharge: 2017-08-07 | DRG: 291 | Disposition: A | Discharge status: Home / Self Care | Payer: Medicare HMO | Attending: Internal Medicine | Admitting: Internal Medicine

## 2017-08-04 ENCOUNTER — Encounter (HOSPITAL_COMMUNITY): Payer: Self-pay

## 2017-08-04 DIAGNOSIS — I5023 Acute on chronic systolic (congestive) heart failure: Secondary | ICD-10-CM

## 2017-08-04 DIAGNOSIS — I5043 Acute on chronic combined systolic (congestive) and diastolic (congestive) heart failure: Secondary | ICD-10-CM | POA: Diagnosis present

## 2017-08-04 DIAGNOSIS — J9611 Chronic respiratory failure with hypoxia: Secondary | ICD-10-CM | POA: Diagnosis not present

## 2017-08-04 DIAGNOSIS — I130 Hypertensive heart and chronic kidney disease with heart failure and stage 1 through stage 4 chronic kidney disease, or unspecified chronic kidney disease: Principal | ICD-10-CM | POA: Diagnosis present

## 2017-08-04 DIAGNOSIS — Z905 Acquired absence of kidney: Secondary | ICD-10-CM

## 2017-08-04 DIAGNOSIS — G4730 Sleep apnea, unspecified: Secondary | ICD-10-CM | POA: Diagnosis present

## 2017-08-04 DIAGNOSIS — Z9119 Patient's noncompliance with other medical treatment and regimen: Secondary | ICD-10-CM | POA: Diagnosis not present

## 2017-08-04 DIAGNOSIS — I272 Pulmonary hypertension, unspecified: Secondary | ICD-10-CM | POA: Diagnosis present

## 2017-08-04 DIAGNOSIS — J449 Chronic obstructive pulmonary disease, unspecified: Secondary | ICD-10-CM | POA: Diagnosis not present

## 2017-08-04 DIAGNOSIS — E1122 Type 2 diabetes mellitus with diabetic chronic kidney disease: Secondary | ICD-10-CM | POA: Diagnosis present

## 2017-08-04 DIAGNOSIS — Z87891 Personal history of nicotine dependence: Secondary | ICD-10-CM

## 2017-08-04 DIAGNOSIS — Z9981 Dependence on supplemental oxygen: Secondary | ICD-10-CM | POA: Diagnosis not present

## 2017-08-04 DIAGNOSIS — I5022 Chronic systolic (congestive) heart failure: Secondary | ICD-10-CM | POA: Diagnosis not present

## 2017-08-04 DIAGNOSIS — J9621 Acute and chronic respiratory failure with hypoxia: Secondary | ICD-10-CM | POA: Diagnosis present

## 2017-08-04 DIAGNOSIS — R0902 Hypoxemia: Secondary | ICD-10-CM | POA: Diagnosis not present

## 2017-08-04 DIAGNOSIS — I509 Heart failure, unspecified: Secondary | ICD-10-CM | POA: Diagnosis not present

## 2017-08-04 DIAGNOSIS — E039 Hypothyroidism, unspecified: Secondary | ICD-10-CM | POA: Diagnosis not present

## 2017-08-04 DIAGNOSIS — R0602 Shortness of breath: Secondary | ICD-10-CM | POA: Diagnosis not present

## 2017-08-04 DIAGNOSIS — J441 Chronic obstructive pulmonary disease with (acute) exacerbation: Secondary | ICD-10-CM | POA: Diagnosis present

## 2017-08-04 DIAGNOSIS — G40909 Epilepsy, unspecified, not intractable, without status epilepticus: Secondary | ICD-10-CM | POA: Diagnosis not present

## 2017-08-04 DIAGNOSIS — E230 Hypopituitarism: Secondary | ICD-10-CM | POA: Diagnosis not present

## 2017-08-04 DIAGNOSIS — E785 Hyperlipidemia, unspecified: Secondary | ICD-10-CM | POA: Diagnosis present

## 2017-08-04 DIAGNOSIS — Z85528 Personal history of other malignant neoplasm of kidney: Secondary | ICD-10-CM

## 2017-08-04 DIAGNOSIS — R569 Unspecified convulsions: Secondary | ICD-10-CM

## 2017-08-04 DIAGNOSIS — N184 Chronic kidney disease, stage 4 (severe): Secondary | ICD-10-CM | POA: Diagnosis present

## 2017-08-04 DIAGNOSIS — E232 Diabetes insipidus: Secondary | ICD-10-CM | POA: Diagnosis present

## 2017-08-04 DIAGNOSIS — Z8249 Family history of ischemic heart disease and other diseases of the circulatory system: Secondary | ICD-10-CM

## 2017-08-04 LAB — CBC WITH DIFFERENTIAL/PLATELET
BASOS ABS: 0.1 10*3/uL (ref 0.0–0.1)
Basophils Relative: 1 %
EOS PCT: 16 %
Eosinophils Absolute: 1.2 10*3/uL — ABNORMAL HIGH (ref 0.0–0.7)
HEMATOCRIT: 31.4 % — AB (ref 39.0–52.0)
Hemoglobin: 9.9 g/dL — ABNORMAL LOW (ref 13.0–17.0)
LYMPHS ABS: 0.9 10*3/uL (ref 0.7–4.0)
LYMPHS PCT: 11 %
MCH: 30.3 pg (ref 26.0–34.0)
MCHC: 31.5 g/dL (ref 30.0–36.0)
MCV: 96 fL (ref 78.0–100.0)
MONO ABS: 0.6 10*3/uL (ref 0.1–1.0)
MONOS PCT: 7 %
NEUTROS ABS: 5.2 10*3/uL (ref 1.7–7.7)
Neutrophils Relative %: 65 %
PLATELETS: 161 10*3/uL (ref 150–400)
RBC: 3.27 MIL/uL — ABNORMAL LOW (ref 4.22–5.81)
RDW: 15.3 % (ref 11.5–15.5)
WBC: 8 10*3/uL (ref 4.0–10.5)

## 2017-08-04 LAB — BASIC METABOLIC PANEL
ANION GAP: 10 (ref 5–15)
BUN: 36 mg/dL — AB (ref 6–20)
CALCIUM: 8.7 mg/dL — AB (ref 8.9–10.3)
CO2: 26 mmol/L (ref 22–32)
Chloride: 103 mmol/L (ref 101–111)
Creatinine, Ser: 2.34 mg/dL — ABNORMAL HIGH (ref 0.61–1.24)
GFR calc Af Amer: 30 mL/min — ABNORMAL LOW (ref >60)
GFR, EST NON AFRICAN AMERICAN: 26 mL/min — AB (ref >60)
GLUCOSE: 96 mg/dL (ref 65–99)
Potassium: 4.2 mmol/L (ref 3.5–5.1)
Sodium: 139 mmol/L (ref 135–145)

## 2017-08-04 LAB — TROPONIN I: Troponin I: 0.03 ng/mL (ref <0.03)

## 2017-08-04 LAB — BRAIN NATRIURETIC PEPTIDE: B Natriuretic Peptide: 1054 pg/mL — ABNORMAL HIGH (ref 0.0–100.0)

## 2017-08-04 MED ORDER — CLONIDINE HCL 0.2 MG PO TABS
0.2000 mg | ORAL_TABLET | Freq: Three times a day (TID) | ORAL | Status: DC
  Administered 2017-08-04 – 2017-08-07 (×8): 0.2 mg via ORAL
  Filled 2017-08-04 (×8): qty 1

## 2017-08-04 MED ORDER — SODIUM CHLORIDE 0.9% FLUSH
3.0000 mL | Freq: Two times a day (BID) | INTRAVENOUS | Status: DC
  Administered 2017-08-05 – 2017-08-07 (×5): 3 mL via INTRAVENOUS

## 2017-08-04 MED ORDER — ALBUTEROL SULFATE (2.5 MG/3ML) 0.083% IN NEBU
2.5000 mg | INHALATION_SOLUTION | RESPIRATORY_TRACT | Status: DC
  Filled 2017-08-04: qty 3

## 2017-08-04 MED ORDER — IPRATROPIUM-ALBUTEROL 0.5-2.5 (3) MG/3ML IN SOLN
3.0000 mL | Freq: Once | RESPIRATORY_TRACT | Status: AC
  Administered 2017-08-04: 3 mL via RESPIRATORY_TRACT
  Filled 2017-08-04: qty 3

## 2017-08-04 MED ORDER — ACETAMINOPHEN 325 MG PO TABS: 650.0000 mg | ORAL_TABLET | ORAL | Status: DC | PRN

## 2017-08-04 MED ORDER — CALCITRIOL 0.25 MCG PO CAPS
0.5000 ug | ORAL_CAPSULE | Freq: Every day | ORAL | Status: DC
  Administered 2017-08-04 – 2017-08-07 (×4): 0.5 ug via ORAL
  Filled 2017-08-04 (×5): qty 2

## 2017-08-04 MED ORDER — DEXAMETHASONE 0.5 MG PO TABS
1.5000 mg | ORAL_TABLET | Freq: Every day | ORAL | Status: DC
  Administered 2017-08-05 – 2017-08-07 (×3): 1.5 mg via ORAL
  Filled 2017-08-04: qty 3
  Filled 2017-08-04: qty 1
  Filled 2017-08-04 (×4): qty 3
  Filled 2017-08-04: qty 1

## 2017-08-04 MED ORDER — ALBUTEROL SULFATE (2.5 MG/3ML) 0.083% IN NEBU
2.5000 mg | INHALATION_SOLUTION | RESPIRATORY_TRACT | Status: DC | PRN
  Administered 2017-08-04 – 2017-08-07 (×6): 2.5 mg via RESPIRATORY_TRACT
  Filled 2017-08-04 (×5): qty 3

## 2017-08-04 MED ORDER — ENOXAPARIN SODIUM 30 MG/0.3ML ~~LOC~~ SOLN
30.0000 mg | SUBCUTANEOUS | Status: DC
  Filled 2017-08-04: qty 0.3

## 2017-08-04 MED ORDER — PREDNISONE 10 MG PO TABS
10.0000 mg | ORAL_TABLET | Freq: Every day | ORAL | Status: DC
  Administered 2017-08-04 – 2017-08-05 (×2): 10 mg via ORAL
  Filled 2017-08-04 (×2): qty 1

## 2017-08-04 MED ORDER — LEVOTHYROXINE SODIUM 75 MCG PO TABS
150.0000 ug | ORAL_TABLET | Freq: Every day | ORAL | Status: DC
  Administered 2017-08-05 – 2017-08-07 (×3): 150 ug via ORAL
  Filled 2017-08-04 (×3): qty 2

## 2017-08-04 MED ORDER — DESMOPRESSIN ACETATE 0.2 MG PO TABS
0.2000 mg | ORAL_TABLET | Freq: Two times a day (BID) | ORAL | Status: DC
Start: 2017-08-04 — End: 2017-08-07
  Administered 2017-08-05 – 2017-08-07 (×6): 0.2 mg via ORAL
  Filled 2017-08-04 (×13): qty 1

## 2017-08-04 MED ORDER — POTASSIUM CHLORIDE CRYS ER 20 MEQ PO TBCR
20.0000 meq | EXTENDED_RELEASE_TABLET | Freq: Every day | ORAL | Status: DC
  Administered 2017-08-04 – 2017-08-07 (×4): 20 meq via ORAL
  Filled 2017-08-04 (×4): qty 1

## 2017-08-04 MED ORDER — FUROSEMIDE 10 MG/ML IJ SOLN
60.0000 mg | Freq: Two times a day (BID) | INTRAMUSCULAR | Status: DC
  Administered 2017-08-04 – 2017-08-07 (×6): 60 mg via INTRAVENOUS
  Filled 2017-08-04 (×6): qty 6

## 2017-08-04 MED ORDER — AMLODIPINE BESYLATE 5 MG PO TABS
10.0000 mg | ORAL_TABLET | Freq: Every day | ORAL | Status: DC
  Administered 2017-08-04 – 2017-08-07 (×4): 10 mg via ORAL
  Filled 2017-08-04 (×4): qty 2

## 2017-08-04 MED ORDER — PHENYTOIN SODIUM EXTENDED 100 MG PO CAPS
100.0000 mg | ORAL_CAPSULE | Freq: Three times a day (TID) | ORAL | Status: DC
  Administered 2017-08-04 – 2017-08-07 (×8): 100 mg via ORAL
  Filled 2017-08-04 (×8): qty 1

## 2017-08-04 MED ORDER — ONDANSETRON HCL 4 MG/2ML IJ SOLN: 4.0000 mg | Freq: Four times a day (QID) | INTRAMUSCULAR | Status: DC | PRN

## 2017-08-04 MED ORDER — ENOXAPARIN SODIUM 30 MG/0.3ML ~~LOC~~ SOLN: 30.0000 mg | SUBCUTANEOUS | Status: DC

## 2017-08-04 MED ORDER — SODIUM CHLORIDE 0.9% FLUSH: 3.0000 mL | INTRAVENOUS | Status: DC | PRN

## 2017-08-04 MED ORDER — ASPIRIN EC 81 MG PO TBEC
81.0000 mg | DELAYED_RELEASE_TABLET | Freq: Every day | ORAL | Status: DC
  Administered 2017-08-04 – 2017-08-07 (×4): 81 mg via ORAL
  Filled 2017-08-04 (×4): qty 1

## 2017-08-04 MED ORDER — SODIUM CHLORIDE 0.9 % IV SOLN: 250.0000 mL | INTRAVENOUS | Status: DC | PRN

## 2017-08-04 MED ORDER — ISOSORB DINITRATE-HYDRALAZINE 20-37.5 MG PO TABS
1.0000 | ORAL_TABLET | Freq: Three times a day (TID) | ORAL | Status: DC
  Administered 2017-08-05 – 2017-08-07 (×8): 1 via ORAL
  Filled 2017-08-04 (×19): qty 1

## 2017-08-04 MED ORDER — FUROSEMIDE 10 MG/ML IJ SOLN
80.0000 mg | Freq: Once | INTRAMUSCULAR | Status: AC
  Administered 2017-08-04: 80 mg via INTRAVENOUS
  Filled 2017-08-04: qty 8

## 2017-08-04 MED ORDER — LEVETIRACETAM 250 MG PO TABS
250.0000 mg | ORAL_TABLET | Freq: Two times a day (BID) | ORAL | Status: DC
  Administered 2017-08-05 – 2017-08-07 (×6): 250 mg via ORAL
  Filled 2017-08-04 (×6): qty 1

## 2017-08-04 MED ORDER — SIMVASTATIN 20 MG PO TABS
20.0000 mg | ORAL_TABLET | Freq: Every day | ORAL | Status: DC
  Administered 2017-08-04 – 2017-08-06 (×3): 20 mg via ORAL
  Filled 2017-08-04 (×3): qty 1

## 2017-08-04 NOTE — ED Notes (Signed)
CRITICAL VALUE ALERT  Critical Value:  Troponin 0.03  Date & Time Notied:  08/04/17 1024  Provider Notified: Rogene Houston  Orders Received/Actions taken: None
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
## 2017-08-04 NOTE — ED Notes (Signed)
Pt transported to Xray. 
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
## 2017-08-04 NOTE — ED Triage Notes (Signed)
Pt reports sob since yesterday and left arm pain.  Reports left arm only hurts with movement.  Also c/o painful urination.

## 2017-08-04 NOTE — ED Notes (Signed)
Pt complains of difficulty and pain with urinating

## 2017-08-04 NOTE — ED Notes (Signed)
Pt states he does not need anything at this time. 

## 2017-08-05 ENCOUNTER — Encounter (HOSPITAL_COMMUNITY): Payer: Self-pay | Admitting: *Deleted

## 2017-08-05 LAB — BASIC METABOLIC PANEL
Anion gap: 10 (ref 5–15)
BUN: 38 mg/dL — ABNORMAL HIGH (ref 6–20)
CO2: 29 mmol/L (ref 22–32)
Calcium: 9.3 mg/dL (ref 8.9–10.3)
Chloride: 104 mmol/L (ref 101–111)
Creatinine, Ser: 2.51 mg/dL — ABNORMAL HIGH (ref 0.61–1.24)
GFR calc Af Amer: 28 mL/min — ABNORMAL LOW (ref >60)
GFR calc non Af Amer: 24 mL/min — ABNORMAL LOW (ref >60)
Glucose, Bld: 104 mg/dL — ABNORMAL HIGH (ref 65–99)
Potassium: 5 mmol/L (ref 3.5–5.1)
Sodium: 143 mmol/L (ref 135–145)

## 2017-08-05 MED ORDER — GUAIFENESIN ER 600 MG PO TB12
600.0000 mg | ORAL_TABLET | Freq: Two times a day (BID) | ORAL | Status: DC
  Administered 2017-08-05 – 2017-08-07 (×5): 600 mg via ORAL
  Filled 2017-08-05 (×5): qty 1

## 2017-08-05 MED ORDER — ENOXAPARIN SODIUM 60 MG/0.6ML ~~LOC~~ SOLN
50.0000 mg | SUBCUTANEOUS | Status: DC
  Administered 2017-08-06: 50 mg via SUBCUTANEOUS
  Filled 2017-08-05 (×2): qty 0.6

## 2017-08-05 MED ORDER — METHYLPREDNISOLONE SODIUM SUCC 125 MG IJ SOLR
60.0000 mg | Freq: Four times a day (QID) | INTRAMUSCULAR | Status: DC
  Administered 2017-08-05 – 2017-08-07 (×9): 60 mg via INTRAVENOUS
  Filled 2017-08-05 (×9): qty 2

## 2017-08-05 NOTE — Progress Notes (Signed)
Subjective: Patient was admitted due to worsening shortness of breath, cough and wheezing. [name] a known case of chronic respiratory failure with hypoxemia secondary to combinations of COPD and CHF.  He feels slightly better today but continue wheeze and cough.  Objective: Vital signs in last 24 hours: Temp:  [97.9 F (36.6 C)-99.5 F (37.5 C)] 98.3 F (36.8 C) (08/29 0439) Pulse Rate:  [88-100] 95 (08/29 0439) Resp:  [15-23] 20 (08/29 0439) BP: (122-155)/(43-67) 135/43 (08/29 0439) SpO2:  [95 %-100 %] 99 % (08/29 1038) Weight:  [106.1 kg (234 lb)-109.1 kg (240 lb 8 oz)] 106.1 kg (234 lb) (08/29 0439) Weight change:  Last BM Date: 08/03/17  Intake/Output from previous day: 08/28 0701 - 08/29 0700 In: -  Out: 275 [Urine:275]  PHYSICAL EXAM General appearance: alert, moderate distress and slowed mentation Resp: diminished breath sounds bilaterally and wheezes bilaterally Cardio: S1, S2 normal GI: soft, non-tender; bowel sounds normal; no masses,  no organomegaly Extremities: edema 2 ++ LEG EDEMA  Lab Results:  Results for orders placed or performed during the hospital encounter of 08/04/17 (from the past 48 hour(s))  CBC with Differential     Status: Abnormal   Collection Time: 08/04/17  8:53 AM  Result Value Ref Range   WBC 8.0 4.0 - 10.5 K/uL   RBC 3.27 (L) 4.22 - 5.81 MIL/uL   Hemoglobin 9.9 (L) 13.0 - 17.0 g/dL   HCT 95.7 (L) 34.6 - 12.4 %   MCV 96.0 78.0 - 100.0 fL   MCH 30.3 26.0 - 34.0 pg   MCHC 31.5 30.0 - 36.0 g/dL   RDW 71.5 26.7 - 42.2 %   Platelets 161 150 - 400 K/uL   Neutrophils Relative % 65 %   Neutro Abs 5.2 1.7 - 7.7 K/uL   Lymphocytes Relative 11 %   Lymphs Abs 0.9 0.7 - 4.0 K/uL   Monocytes Relative 7 %   Monocytes Absolute 0.6 0.1 - 1.0 K/uL   Eosinophils Relative 16 %   Eosinophils Absolute 1.2 (H) 0.0 - 0.7 K/uL   Basophils Relative 1 %   Basophils Absolute 0.1 0.0 - 0.1 K/uL  Basic metabolic panel     Status: Abnormal   Collection Time:  08/04/17  8:53 AM  Result Value Ref Range   Sodium 139 135 - 145 mmol/L   Potassium 4.2 3.5 - 5.1 mmol/L   Chloride 103 101 - 111 mmol/L   CO2 26 22 - 32 mmol/L   Glucose, Bld 96 65 - 99 mg/dL   BUN 36 (H) 6 - 20 mg/dL   Creatinine, Ser 8.42 (H) 0.61 - 1.24 mg/dL   Calcium 8.7 (L) 8.9 - 10.3 mg/dL   GFR calc non Af Amer 26 (L) >60 mL/min   GFR calc Af Amer 30 (L) >60 mL/min    Comment: (NOTE) The eGFR has been calculated using the CKD EPI equation. This calculation has not been validated in all clinical situations. eGFR's persistently <60 mL/min signify possible Chronic Kidney Disease.    Anion gap 10 5 - 15  Troponin I     Status: Abnormal   Collection Time: 08/04/17  8:53 AM  Result Value Ref Range   Troponin I 0.03 (HH) <0.03 ng/mL    Comment: CRITICAL RESULT CALLED TO, READ BACK BY AND VERIFIED WITH: LONG H. AT 1016A ON [id number] BY THOMPSON S.   Brain natriuretic peptide     Status: Abnormal   Collection Time: 08/04/17  9:40 AM  Result Value  Ref Range   B Natriuretic Peptide 1,054.0 (H) 0.0 - 100.0 pg/mL  Basic metabolic panel     Status: Abnormal   Collection Time: 08/05/17  5:54 AM  Result Value Ref Range   Sodium 143 135 - 145 mmol/L   Potassium 5.0 3.5 - 5.1 mmol/L   Chloride 104 101 - 111 mmol/L   CO2 29 22 - 32 mmol/L   Glucose, Bld 104 (H) 65 - 99 mg/dL   BUN 38 (H) 6 - 20 mg/dL   Creatinine, Ser 2.51 (H) 0.61 - 1.24 mg/dL   Calcium 9.3 8.9 - 10.3 mg/dL   GFR calc non Af Amer 24 (L) >60 mL/min   GFR calc Af Amer 28 (L) >60 mL/min    Comment: (NOTE) The eGFR has been calculated using the CKD EPI equation. This calculation has not been validated in all clinical situations. eGFR's persistently <60 mL/min signify possible Chronic Kidney Disease.    Anion gap 10 5 - 15    ABGS No results for input(s): PHART, PO2ART, TCO2, HCO3 in the last 72 hours.  Invalid input(s): PCO2 CULTURES No results found for this or any previous visit (from the past 240  hour(s)). Studies/Results: Dg Chest 2 View  Result Date: 08/04/2017 CLINICAL DATA:  Eck of shortness of breath. History of CHF, aortic insufficiency COPD, hypertension, former smoker. Previous nephrectomy for malignancy. EXAM: CHEST  2 VIEW COMPARISON:  Chest x-ray of June 04, 2017 FINDINGS: The cardiac silhouette is enlarged. The pulmonary vascularity is engorged and less distinct today. The lungs are adequately inflated. There is no alveolar infiltrate or pleural effusion. There is calcification in the wall of the aortic arch. There is multilevel degenerative disc disease of the thoracic spine. IMPRESSION: CHF with mild pulmonary vascular congestion and mild interstitial edema. This has worsened since the previous study. No alveolar pneumonia. Thoracic aortic atherosclerosis. Electronically Signed   By: David  Martinique M.D.   On: 08/04/2017 09:31    Medications: I have reviewed the patient's current medications.  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

## 2017-08-05 NOTE — Care Management Note (Signed)
Case Management Note  Patient Details  Name: [name] [name] MRN: [id number] Date of Birth: 1946/10/27  Subjective/Objective:   Adm with acute on chronic CHF. From home alone, has RW and cane PTA, reports using a cane most of the time. Daughter drives him to appointments, PCP is Dr. Legrand Rams. He has continuous oxygen setup provided by [employer]. Patient states that his daughter is working to have oxygen provider changed but is unsure of name. No home health currently. Reports he does not feel he will need home health at time of DC.            Action/Plan: Anticipate DC home with self care. Will follow.    Expected Discharge Date:     08/07/2017             Expected Discharge Plan:  Home/Self Care  In-House Referral:     Discharge planning Services  CM Consult  Post Acute Care Choice:  NA Choice offered to:  NA  DME Arranged:    DME Agency:     HH Arranged:    HH Agency:     Status of Service:  Completed, signed off  If discussed at [employer] of Stay Meetings, dates discussed:    Additional Comments:  [name], Chauncey Reading, RN 08/05/2017, 11:18 AM

## 2017-08-06 LAB — BASIC METABOLIC PANEL
ANION GAP: 9 (ref 5–15)
BUN: 53 mg/dL — ABNORMAL HIGH (ref 6–20)
CALCIUM: 9 mg/dL (ref 8.9–10.3)
CO2: 25 mmol/L (ref 22–32)
CREATININE: 2.43 mg/dL — AB (ref 0.61–1.24)
Chloride: 103 mmol/L (ref 101–111)
GFR calc Af Amer: 29 mL/min — ABNORMAL LOW (ref >60)
GFR, EST NON AFRICAN AMERICAN: 25 mL/min — AB (ref >60)
GLUCOSE: 101 mg/dL — AB (ref 65–99)
Potassium: 4.8 mmol/L (ref 3.5–5.1)
Sodium: 137 mmol/L (ref 135–145)

## 2017-08-06 MED ORDER — FLUTICASONE PROPIONATE 50 MCG/ACT NA SUSP
1.0000 | Freq: Every day | NASAL | Status: DC
  Administered 2017-08-06 – 2017-08-07 (×2): 1 via NASAL
  Filled 2017-08-06: qty 16

## 2017-08-06 NOTE — Consult Note (Addendum)
Consult requested by:Dr. fanta Consult requested for: Acute on chronic respiratory failure  HPI: This is a 71 year old who has a history of multiple medical problems including chronic combined congestive heart failure, seizures, diabetes type 2 COPD stage IV chronic kidney disease sleep apnea noncompliant with CPAP hypertension, panhypopituitarism aortic insufficiency for which she is felt not to be an operative candidate. He came to the emergency department with increasing shortness of breath and lower extremity edema was found to have volume overload chest x-ray showing pulmonary edema and BNP greater than 1000. He says he feels better. He complains of having some dysuria. He's not coughing much. No chest pain. He's not having any PND or orthopnea now. He's not coughing anything up. [name] wearing oxygen.  Past Medical History:  Diagnosis Date  . Aortic insufficiency 10/2012   a. Mod-severe, not an operable candidate.  . Candida esophagitis ([place])    a. Probable by EGD 03/2015.  . Cellulitis of lower leg   . Chronic obstructive pulmonary disease (HCC)    Chronic bronchitis;  home oxygen; multiple exacerbations  . Chronic respiratory failure ([place])   . Chronic systolic CHF (congestive heart failure) ([place])    a.  Last echo 11/2014: EF 40-45%, diffuse HK, mild LVH, elevated LVEDP, mod-severe AI with severely thickened leaflets, dilated sinus of valsalva 4.5cm, visualized portion of prox ascending aorta 4.7cm, mild MR, mildly dilated LA/RV/RA, PASP 60. LV dysfunction felt due to AI. Cath 12/2014 with minimal CAD - 20% LM otherwise minimal luminal irregularities.  . CKD (chronic kidney disease), stage IV (HCC)    S/P right nephrectomy for hypernephroma in 2010  . Diabetes mellitus, type 2 ([place])   . GI bleed    a. upper GIB in 03/2015 wit thrombocytopenia and ABL anemia. b. Colonoscopy with pooling of dark burgundy blood noted throughout colon but without obvious bleeding lesion. EGD 03/07/2015 showed  probable candida esophagitis, mild gastritis, source for GI bleed not seen. c. Capsule study showed active bleeding at 2 hours into the SB.  Marland Kitchen Hyperlipidemia    No lipid profile available  . Hypertension   . Hypothyroidism    10/2002: TSH-0.43, T4-0.77  . Obesity 10/28/2012  . On home O2    2L N/C  . 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 Pulmonary hypertension ([place])   . RBBB   . Seizure disorder ([place])    Onset after craniotomy  . Sleep apnea    Severe on a sleep study in 12/2010  . Tobacco abuse, in remission    20 pack years; discontinued 1998     Family History  Problem Relation Age of Onset  . Cancer Mother   . Cancer Father   . Cancer Sister   . Heart failure Sister   . Cancer Brother   . Colon cancer Neg Hx      Social History   Social History  . Marital status: Single    Spouse name: N/A  . Number of children: 1  . Years of education: N/A   Occupational History  . Retired     Engineer, manufacturing systems   Social History Main Topics  . Smoking status: Former Smoker    Packs/day: 1.00    Types: Cigarettes    Start date: 11/20/1960    Quit date: 11/16/1989  . Smokeless tobacco: Never Used  . Alcohol use No  . Drug use: No  . Sexual activity:  No   Other Topics Concern  . None   Social History Narrative   Lives in [place] alone with good family support from his daughter.     ROS: Except as mentioned 10 point review of systems is negative    Objective: Vital signs in last 24 hours: Temp:  [98.5 F (36.9 C)-99.4 F (37.4 C)] 99.4 F (37.4 C) (08/30 0518) Pulse Rate:  [83-89] 87 (08/30 0518) Resp:  [17-18] 18 (08/30 0518) BP: (122-132)/(46-53) 122/53 (08/30 0518) SpO2:  [95 %-100 %] 97 % (08/30 0518) Weight:  [107.3 kg (236 lb 8 oz)] 107.3 kg (236 lb 8 oz) (08/30 0533) Weight change: 1.588 kg (3 lb 8 oz) Last BM Date:  08/05/17  Intake/Output from previous day: 08/29 0701 - 08/30 0700 In: 240 [P.O.:240] Out: 1500 [Urine:1500]  PHYSICAL EXAM Constitutional: [name] awake and alert and in no acute distress. [name] morbidly obese. Eyes: Pupils react. EOMI. Ears nose mouth and throat: His mucous membranes are moist. Hearing slightly decreased. Throat is clear. Cardiovascular: His heart is regular with a systolic heart murmur. No gallop. He has minimal edema of his lower extremities and has chronic venous stasis changes. Respiratory: His respiratory effort is normal. His lungs show some rhonchi. Gastrointestinal: His abdomen is soft with no masses. Musculoskeletal: Normal strength. Neurological: No focal abnormalities. Psychiatric: Normal mood and affect  Lab Results: Basic Metabolic Panel:  Recent Labs  08/05/17 0554 08/06/17 0558  NA 143 137  K 5.0 4.8  CL 104 103  CO2 29 25  GLUCOSE 104* 101*  BUN 38* 53*  CREATININE 2.51* 2.43*  CALCIUM 9.3 9.0   Liver Function Tests: No results for input(s): AST, ALT, ALKPHOS, BILITOT, PROT, ALBUMIN in the last 72 hours. No results for input(s): LIPASE, AMYLASE in the last 72 hours. No results for input(s): AMMONIA in the last 72 hours. CBC:  Recent Labs  08/04/17 0853  WBC 8.0  NEUTROABS 5.2  HGB 9.9*  HCT 31.4*  MCV 96.0  PLT 161   Cardiac Enzymes:  Recent Labs  08/04/17 0853  TROPONINI 0.03*   BNP: No results for input(s): PROBNP in the last 72 hours. D-Dimer: No results for input(s): DDIMER in the last 72 hours. CBG: No results for input(s): GLUCAP in the last 72 hours. Hemoglobin A1C: No results for input(s): HGBA1C in the last 72 hours. Fasting Lipid Panel: No results for input(s): CHOL, HDL, LDLCALC, TRIG, CHOLHDL, LDLDIRECT in the last 72 hours. Thyroid Function Tests: No results for input(s): TSH, T4TOTAL, FREET4, T3FREE, THYROIDAB in the last 72 hours. Anemia Panel: No results for input(s): VITAMINB12, FOLATE, FERRITIN, TIBC,  IRON, RETICCTPCT in the last 72 hours. Coagulation: No results for input(s): LABPROT, INR in the last 72 hours. Urine Drug Screen: Drugs of Abuse  No results found for: LABOPIA, COCAINSCRNUR, LABBENZ, AMPHETMU, THCU, LABBARB  Alcohol Level: No results for input(s): ETH in the last 72 hours. Urinalysis: No results for input(s): COLORURINE, LABSPEC, PHURINE, GLUCOSEU, HGBUR, BILIRUBINUR, KETONESUR, PROTEINUR, UROBILINOGEN, NITRITE, LEUKOCYTESUR in the last 72 hours.  Invalid input(s): APPERANCEUR Misc. Labs:   ABGS: No results for input(s): PHART, PO2ART, TCO2, HCO3 in the last 72 hours.  Invalid input(s): PCO2   MICROBIOLOGY: No results found for this or any previous visit (from the past 240 hour(s)).  Studies/Results: Dg Chest 2 View  Result Date: 08/04/2017 CLINICAL DATA:  Eck of shortness of breath. History of CHF, aortic insufficiency COPD, hypertension, former smoker. Previous nephrectomy for malignancy. EXAM: CHEST  2 VIEW COMPARISON:  Chest x-ray of June 04, 2017 FINDINGS: The cardiac silhouette is enlarged. The pulmonary vascularity is engorged and less distinct today. The lungs are adequately inflated. There is no alveolar infiltrate or pleural effusion. There is calcification in the wall of the aortic arch. There is multilevel degenerative disc disease of the thoracic spine. IMPRESSION: CHF with mild pulmonary vascular congestion and mild interstitial edema. This has worsened since the previous study. No alveolar pneumonia. Thoracic aortic atherosclerosis. Electronically Signed   By: David  Martinique M.D.   On: 08/04/2017 09:31    Medications:  Prior to Admission:  Prescriptions Prior to Admission  Medication Sig Dispense Refill Last Dose  . acetaminophen (TYLENOL) 325 MG tablet Take 650 mg by mouth every 6 (six) hours as needed for mild pain.    08/03/2017 at Unknown time  . amLODipine (NORVASC) 10 MG tablet TAKE ONE TABLET BY MOUTH DAILY. 30 tablet 2 08/03/2017 at Unknown  time  . calcitRIOL (ROCALTROL) 0.5 MCG capsule Take 0.5 mcg by mouth daily.   08/03/2017 at Unknown time  . cloNIDine (CATAPRES) 0.2 MG tablet TAKE (1) TABLET BY MOUTH (3) TIMES DAILY 90 tablet 3 08/03/2017 at Unknown time  . colchicine 0.6 MG tablet Take 1 tablet (0.6 mg total) by mouth daily. 60 tablet 2 08/03/2017 at Unknown time  . desmopressin (DDAVP) 0.2 MG tablet Take 1 tablet (0.2 mg total) by mouth 2 (two) times daily. 60 tablet 12 08/03/2017 at Unknown time  . dexamethasone (DECADRON) 1.5 MG tablet Take 1.5 mg by mouth daily.   08/03/2017 at Unknown time  . ipratropium-albuterol (DUONEB) 0.5-2.5 (3) MG/3ML SOLN Take 3 mLs by nebulization 2 (two) times daily. And additional every four hours as needed   08/03/2017 at Unknown time  . isosorbide-hydrALAZINE (BIDIL) 20-37.5 MG tablet Take 1 tablet by mouth 3 (three) times daily. 90 tablet 3 08/03/2017 at Unknown time  . levETIRAcetam (KEPPRA) 250 MG tablet Take 1 tablet (250 mg total) by mouth 2 (two) times daily. 60 tablet 3 08/03/2017 at Unknown time  . levothyroxine (SYNTHROID, LEVOTHROID) 150 MCG tablet Take 150 mcg by mouth daily before breakfast.    08/03/2017 at Unknown time  . loperamide (IMODIUM) 2 MG capsule Take 1 capsule (2 mg total) by mouth 4 (four) times daily as needed for diarrhea or loose stools. 12 capsule 0 08/03/2017 at Unknown time  . phenytoin (DILANTIN) 100 MG ER capsule Take 100 mg by mouth 3 (three) times daily.   08/03/2017 at Unknown time  . potassium chloride (K-DUR,KLOR-CON) 20 MEQ tablet Take 1 tablet (20 mEq total) by mouth daily. 30 tablet 3 08/03/2017 at Unknown time  . predniSONE (DELTASONE) 10 MG tablet    08/03/2017 at Unknown time  . simvastatin (ZOCOR) 20 MG tablet    08/03/2017 at Unknown time  . testosterone cypionate (DEPOTESTOSTERONE CYPIONATE) 200 MG/ML injection Inject 200 mg into the muscle every 28 (twenty-eight) days.    08/03/2017 at Unknown time  . torsemide (DEMADEX) 20 MG tablet Take 40 mg by mouth daily.    08/03/2017 at Unknown time  . vancomycin (VANCOCIN HCL) 125 MG capsule Take 1 capsule (125 mg total) by mouth 4 (four) times daily. (Patient not taking: Reported on 08/04/2017) 28 capsule 0 Not Taking at Unknown time   Scheduled: . amLODipine  10 mg Oral Daily  . aspirin EC  81 mg Oral Daily  . calcitRIOL  0.5 mcg Oral Daily  . cloNIDine  0.2 mg Oral TID  .  desmopressin  0.2 mg Oral BID  . dexamethasone  1.5 mg Oral Daily  . enoxaparin (LOVENOX) injection  50 mg Subcutaneous Q24H  . furosemide  60 mg Intravenous Q12H  . guaiFENesin  600 mg Oral BID  . isosorbide-hydrALAZINE  1 tablet Oral TID  . levETIRAcetam  250 mg Oral BID  . levothyroxine  150 mcg Oral QAC breakfast  . methylPREDNISolone (SOLU-MEDROL) injection  60 mg Intravenous Q6H  . phenytoin  100 mg Oral TID  . potassium chloride SA  20 mEq Oral Daily  . simvastatin  20 mg Oral q1800  . sodium chloride flush  3 mL Intravenous Q12H   Continuous: . sodium chloride     SVX:[id number] chloride, acetaminophen, albuterol, ondansetron (ZOFRAN) IV, sodium chloride flush  Assesment:He was admitted with acute on chronic respiratory failure with hypoxia related to acute on chronic combined systolic and diastolic heart failure and COPD exacerbation. [name] significantly better after receiving steroids diuresis bronchodilators. He has sleep apnea at baseline and is noncompliant with CPAP despite having talked to him on multiple occasions about that and explaining how it might make his cardiac situation better. I don't think he's going to start wearing CPAP. At baseline he has panhypopituitarism on replacement therapy. He has seizure disorder related to his previous surgery for panhypopituitarism. He has hypertension which is fairly well controlled here he has chronic renal failure stage IV. 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 He does seem to be improving. Could probably switch to oral prednisone.  Thanks for allowing me to see him with you    LOS: 2 days   Lary Eckardt L 08/06/2017, 8:16 AM

## 2017-08-06 NOTE — Progress Notes (Signed)
Subjective: Patient feels better today. [name] less short of breath. [name] started on IV steroid..  Objective: Vital signs in last 24 hours: Temp:  [98.5 F (36.9 C)-99.4 F (37.4 C)] 99.4 F (37.4 C) (08/30 0518) Pulse Rate:  [83-89] 87 (08/30 0518) Resp:  [17-18] 18 (08/30 0518) BP: (122-132)/(46-53) 122/53 (08/30 0518) SpO2:  [95 %-100 %] 97 % (08/30 0518) Weight:  [107.3 kg (236 lb 8 oz)] 107.3 kg (236 lb 8 oz) (08/30 0533) Weight change: 1.588 kg (3 lb 8 oz) Last BM Date: 08/05/17  Intake/Output from previous day: 08/29 0701 - 08/30 0700 In: 240 [P.O.:240] Out: 1500 [Urine:1500]  PHYSICAL EXAM General appearance: alert, moderate distress and slowed mentation Resp: diminished breath sounds bilaterally and wheezes bilaterally Cardio: S1, S2 normal GI: soft, non-tender; bowel sounds normal; no masses,  no organomegaly Extremities: edema 2 ++ LEG EDEMA  Lab Results:  Results for orders placed or performed during the hospital encounter of 08/04/17 (from the past 48 hour(s))  CBC with Differential     Status: Abnormal   Collection Time: 08/04/17  8:53 AM  Result Value Ref Range   WBC 8.0 4.0 - 10.5 K/uL   RBC 3.27 (L) 4.22 - 5.81 MIL/uL   Hemoglobin 9.9 (L) 13.0 - 17.0 g/dL   HCT 31.4 (L) 39.0 - 52.0 %   MCV 96.0 78.0 - 100.0 fL   MCH 30.3 26.0 - 34.0 pg   MCHC 31.5 30.0 - 36.0 g/dL   RDW 15.3 11.5 - 15.5 %   Platelets 161 150 - 400 K/uL   Neutrophils Relative % 65 %   Neutro Abs 5.2 1.7 - 7.7 K/uL   Lymphocytes Relative 11 %   Lymphs Abs 0.9 0.7 - 4.0 K/uL   Monocytes Relative 7 %   Monocytes Absolute 0.6 0.1 - 1.0 K/uL   Eosinophils Relative 16 %   Eosinophils Absolute 1.2 (H) 0.0 - 0.7 K/uL   Basophils Relative 1 %   Basophils Absolute 0.1 0.0 - 0.1 K/uL  Basic metabolic panel     Status: Abnormal   Collection Time: 08/04/17  8:53 AM  Result Value Ref Range   Sodium 139 135 - 145 mmol/L   Potassium 4.2 3.5 - 5.1 mmol/L   Chloride 103 101 - 111 mmol/L   CO2  26 22 - 32 mmol/L   Glucose, Bld 96 65 - 99 mg/dL   BUN 36 (H) 6 - 20 mg/dL   Creatinine, Ser 2.34 (H) 0.61 - 1.24 mg/dL   Calcium 8.7 (L) 8.9 - 10.3 mg/dL   GFR calc non Af Amer 26 (L) >60 mL/min   GFR calc Af Amer 30 (L) >60 mL/min    Comment: (NOTE) The eGFR has been calculated using the CKD EPI equation. This calculation has not been validated in all clinical situations. eGFR's persistently <60 mL/min signify possible Chronic Kidney Disease.    Anion gap 10 5 - 15  Troponin I     Status: Abnormal   Collection Time: 08/04/17  8:53 AM  Result Value Ref Range   Troponin I 0.03 (HH) <0.03 ng/mL    Comment: CRITICAL RESULT CALLED TO, READ BACK BY AND VERIFIED WITH: LONG H. AT 1016A ON [id number] BY THOMPSON S.   Brain natriuretic peptide     Status: Abnormal   Collection Time: 08/04/17  9:40 AM  Result Value Ref Range   B Natriuretic Peptide 1,054.0 (H) 0.0 - 100.0 pg/mL  Basic metabolic panel  Status: Abnormal   Collection Time: 08/05/17  5:54 AM  Result Value Ref Range   Sodium 143 135 - 145 mmol/L   Potassium 5.0 3.5 - 5.1 mmol/L   Chloride 104 101 - 111 mmol/L   CO2 29 22 - 32 mmol/L   Glucose, Bld 104 (H) 65 - 99 mg/dL   BUN 38 (H) 6 - 20 mg/dL   Creatinine, Ser 2.51 (H) 0.61 - 1.24 mg/dL   Calcium 9.3 8.9 - 10.3 mg/dL   GFR calc non Af Amer 24 (L) >60 mL/min   GFR calc Af Amer 28 (L) >60 mL/min    Comment: (NOTE) The eGFR has been calculated using the CKD EPI equation. This calculation has not been validated in all clinical situations. eGFR's persistently <60 mL/min signify possible Chronic Kidney Disease.    Anion gap 10 5 - 15  Basic metabolic panel     Status: Abnormal   Collection Time: 08/06/17  5:58 AM  Result Value Ref Range   Sodium 137 135 - 145 mmol/L   Potassium 4.8 3.5 - 5.1 mmol/L   Chloride 103 101 - 111 mmol/L   CO2 25 22 - 32 mmol/L   Glucose, Bld 101 (H) 65 - 99 mg/dL   BUN 53 (H) 6 - 20 mg/dL   Creatinine, Ser 2.43 (H) 0.61 - 1.24 mg/dL    Calcium 9.0 8.9 - 10.3 mg/dL   GFR calc non Af Amer 25 (L) >60 mL/min   GFR calc Af Amer 29 (L) >60 mL/min    Comment: (NOTE) The eGFR has been calculated using the CKD EPI equation. This calculation has not been validated in all clinical situations. eGFR's persistently <60 mL/min signify possible Chronic Kidney Disease.    Anion gap 9 5 - 15    ABGS No results for input(s): PHART, PO2ART, TCO2, HCO3 in the last 72 hours.  Invalid input(s): PCO2 CULTURES No results found for this or any previous visit (from the past 240 hour(s)). Studies/Results: Dg Chest 2 View  Result Date: 08/04/2017 CLINICAL DATA:  Eck of shortness of breath. History of CHF, aortic insufficiency COPD, hypertension, former smoker. Previous nephrectomy for malignancy. EXAM: CHEST  2 VIEW COMPARISON:  Chest x-ray of June 04, 2017 FINDINGS: The cardiac silhouette is enlarged. The pulmonary vascularity is engorged and less distinct today. The lungs are adequately inflated. There is no alveolar infiltrate or pleural effusion. There is calcification in the wall of the aortic arch. There is multilevel degenerative disc disease of the thoracic spine. IMPRESSION: CHF with mild pulmonary vascular congestion and mild interstitial edema. This has worsened since the previous study. No alveolar pneumonia. Thoracic aortic atherosclerosis. Electronically Signed   By: David  Martinique M.D.   On: 08/04/2017 09:31    Medications: I have reviewed the patient's current medications.  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

## 2017-08-07 ENCOUNTER — Encounter (HOSPITAL_COMMUNITY): Payer: Self-pay | Admitting: *Deleted

## 2017-08-07 LAB — BASIC METABOLIC PANEL
ANION GAP: 8 (ref 5–15)
BUN: 64 mg/dL — ABNORMAL HIGH (ref 6–20)
CO2: 25 mmol/L (ref 22–32)
Calcium: 8.9 mg/dL (ref 8.9–10.3)
Chloride: 102 mmol/L (ref 101–111)
Creatinine, Ser: 2.51 mg/dL — ABNORMAL HIGH (ref 0.61–1.24)
GFR calc Af Amer: 28 mL/min — ABNORMAL LOW (ref >60)
GFR, EST NON AFRICAN AMERICAN: 24 mL/min — AB (ref >60)
GLUCOSE: 94 mg/dL (ref 65–99)
POTASSIUM: 4.9 mmol/L (ref 3.5–5.1)
SODIUM: 135 mmol/L (ref 135–145)

## 2017-08-07 MED ORDER — PREDNISONE 10 MG (21) PO TBPK: ORAL_TABLET | ORAL | 0 refills | Status: DC

## 2017-08-07 MED ORDER — TORSEMIDE 20 MG PO TABS: 60.0000 mg | ORAL_TABLET | Freq: Two times a day (BID) | ORAL | 3 refills | Status: DC

## 2017-08-07 NOTE — Discharge Summary (Signed)
Physician Discharge Summary  Patient ID: [name] [name] MRN: [id number] DOB/AGE: 08/05/1946 71 y.o. 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   amLODipine 10 MG tablet Commonly known as:  NORVASC TAKE ONE TABLET BY MOUTH DAILY.   calcitRIOL 0.5 MCG capsule Commonly known as:  ROCALTROL Take 0.5 mcg by mouth daily.   cloNIDine 0.2 MG tablet Commonly known as:  CATAPRES TAKE (1) TABLET BY MOUTH (3) TIMES DAILY   colchicine 0.6 MG tablet Take 1 tablet (0.6 mg total) by mouth daily.   desmopressin 0.2 MG tablet Commonly known as:  DDAVP Take 1 tablet (0.2 mg total) by mouth 2 (two) times daily.   dexamethasone 1.5 MG tablet Commonly known as:  DECADRON Take 1.5 mg by mouth daily.   ipratropium-albuterol 0.5-2.5 (3) MG/3ML Soln Commonly known as:  DUONEB Take 3 mLs by nebulization 2 (two) times daily. And additional every four hours as needed   isosorbide-hydrALAZINE 20-37.5 MG tablet Commonly known as:  BIDIL Take 1 tablet by mouth 3 (three) times daily.   levETIRAcetam 250 MG tablet Commonly known as:  KEPPRA Take 1 tablet (250 mg total) by  mouth 2 (two) times daily.   levothyroxine 150 MCG tablet Commonly known as:  SYNTHROID, LEVOTHROID Take 150 mcg by mouth daily before breakfast.   loperamide 2 MG capsule Commonly known as:  IMODIUM Take 1 capsule (2 mg total) by mouth 4 (four) times daily as needed for diarrhea or loose stools.   phenytoin 100 MG ER capsule Commonly known as:  DILANTIN Take 100 mg by mouth 3 (three) times daily.   potassium chloride SA 20 MEQ tablet Commonly known as:  K-DUR,KLOR-CON Take 1 tablet (20 mEq total) by mouth daily.   predniSONE 10 MG (21) Tbpk tablet Commonly known as:  STERAPRED UNI-PAK 21 TAB 4 tab po day for 3 days, then 3 tab po daily for 3 days, then 2 tab po daily for 3  days then 10 mg po daily for 3 days Replaces:  predniSONE 10 MG tablet   simvastatin 20 MG tablet Commonly known as:  ZOCOR   testosterone cypionate 200 MG/ML injection Commonly known as:  DEPOTESTOSTERONE CYPIONATE Inject 200 mg into the muscle every 28 (twenty-eight) days.   torsemide 20 MG tablet Commonly known as:  DEMADEX Take 3 tablets (60 mg total) by mouth 2 (two) times daily. What changed:  how much to take  when to take this            Discharge Care Instructions        Start     Ordered  08/07/17 0000  torsemide (DEMADEX) 20 MG tablet  2 times daily     08/07/17 0823   08/07/17 0000  predniSONE (STERAPRED UNI-PAK 21 TAB) 10 MG (21) TBPK tablet     08/07/17 0623      Discharged Condition: improved    Consults: pulmonary  Significant Diagnostic Studies: Ct Abdomen Pelvis Wo Contrast  Result Date: 07/09/2017 CLINICAL DATA:  71 year old male with abdominal pain. EXAM: CT ABDOMEN AND PELVIS WITHOUT CONTRAST TECHNIQUE: Multidetector CT imaging of the abdomen and pelvis was performed following the standard protocol without IV contrast. COMPARISON:  Abdominal CT dated 05/08/2017 FINDINGS: Evaluation of this exam is limited in the absence of intravenous contrast. Lower chest:  Partially visualized trace bilateral pleural effusions, new from prior CT. There is a 10 mm nodule in the right lung base similar to prior CT and stable since the CT of 08/08/2014. There is mild cardiomegaly. No intra-abdominal free air or free fluid. Hepatobiliary: The liver is unremarkable. No intrahepatic biliary ductal dilatation. No calcified gallstone or pericholecystic fluid. Pancreas: Unremarkable. No pancreatic ductal dilatation or surrounding inflammatory changes. Spleen: Normal in size without focal abnormality. Adrenals/Urinary Tract: The adrenal glands are unremarkable. Status post prior right nephrectomy. No significant interval change in the postsurgical appearance compared to the prior CT. Multiple left renal exophytic lesions measuring up to 2.7 cm in the interpolar aspect of the left kidney are not characterized on this noncontrast CT but appears similar to prior CT. There is no hydronephrosis or nephrolithiasis. The visualized ureter and urinary bladder appear unremarkable. Stomach/Bowel: There is no evidence of bowel obstruction or active inflammation. Normal appendix. Vascular/Lymphatic: There is moderate aortoiliac atherosclerotic disease. The aorta is mildly tortuous. No portal venous gas identified. There is no adenopathy. Reproductive: The prostate and seminal vesicles are grossly unremarkable. Other: There is a small fat containing umbilical hernia. There is a partially visualized fatty lesion in the subcutaneous soft tissues of the right lateral chest wall similar to prior CT most consistent with a lipoma. Musculoskeletal: Degenerative changes of the spine. No acute osseous pathology. IMPRESSION: 1. Small bilateral pleural effusions, new compared the prior CT. Clinical correlation is recommended. 2. Stable 10 mm right lung base pulmonary nodule. 3. No acute intra-abdominopelvic pathology. Stable postsurgical appearance of right nephrectomy. Multiple left renal lesions, incompletely  characterized on this noncontrast CT but similar to prior study and likely complex cysts. 4. No bowel obstruction or active inflammation.  Normal appendix. Electronically Signed   By: Anner Crete M.D.   On: 07/09/2017 21:14   Dg Chest 2 View  Result Date: 08/04/2017 CLINICAL DATA:  Eck of shortness of breath. History of CHF, aortic insufficiency COPD, hypertension, former smoker. Previous nephrectomy for malignancy. EXAM: CHEST  2 VIEW COMPARISON:  Chest x-ray of June 04, 2017 FINDINGS: The cardiac silhouette is enlarged. The pulmonary vascularity is engorged and less distinct today. The lungs are adequately inflated. There is no alveolar infiltrate or pleural effusion. There is calcification in the wall of the aortic arch. There is multilevel degenerative disc disease of the thoracic spine. IMPRESSION: CHF with mild pulmonary vascular congestion and mild interstitial edema. This has worsened since the previous study. No alveolar pneumonia. Thoracic aortic atherosclerosis. Electronically Signed   By: David  Martinique M.D.   On: 08/04/2017 09:31    Lab Results: Basic Metabolic Panel:  Recent Labs  08/06/17 0558 08/07/17 0603  NA 137 135  K 4.8 4.9  CL 103 102  CO2 25 25  GLUCOSE 101* 94  BUN 53* 64*  CREATININE 2.43* 2.51*  CALCIUM 9.0 8.9   Liver Function Tests: No results for input(s): AST, ALT, ALKPHOS, BILITOT, PROT, ALBUMIN in the last 72 hours.   CBC:  Recent Labs  08/04/17 0853  WBC 8.0  NEUTROABS 5.2  HGB 9.9*  HCT 31.4*  MCV 96.0  PLT 161    No results found for this or any previous visit (from the past 240 hour(s)).   Hospital Course:   This is a 71 years old male with history of multiple medical illnesses was readmitted due to acute on chronic respiratory failure secondary to combination of CHF and COPD. Patient was started on Iv lasix and IV solumedrol. He was evaluated pulmonologist. Patient improved. His medications adjusted and he is being discharged in  stable condition. He will be followed in the office in 2 weeks.  Discharge Exam: Blood pressure (!) 117/40, pulse 89, temperature 98.3 F (36.8 C), temperature source Oral, resp. rate 20, height 6\' 1"  (1.854 m), weight 110.5 kg (243 lb 8 oz), SpO2 97 %.    Disposition:  home    Follow-up Information    [name], MD Follow up in 2 week(s).   Specialty:  Internal Medicine Contact information: [address] [place] [id number] [phone]           Signed: [name]   08/07/2017, 8:24 AM

## 2017-08-07 NOTE — Progress Notes (Signed)
Pt. Educated and given instructions for discharge. Pt verbalized understanding. Daughter is picking up to take home.

## 2017-08-07 NOTE — Care Management Note (Signed)
Case Management Note  Patient Details  Name: [name] [name] MRN: [id number] Date of Birth: [birth year]  Expected Discharge Date:  08/07/17               Expected Discharge Plan:  Home/Self Care  In-House Referral:  NA  Discharge planning Services  CM Consult  Post Acute Care Choice:  NA Choice offered to:  NA  Status of Service:  Completed, signed off  Additional Comments: Pt discharging home today with self care. Daughter to transport home. Pt declines [facility] services at this time.   Sherald Barge, RN 08/07/2017, 9:52 AM

## 2017-08-07 NOTE — Care Management Important Message (Signed)
Important Message  Patient Details  Name: [name] [name] MRN: [id number] Date of Birth: 12/10/1945   Medicare Important Message Given:  Yes    Sherald Barge, RN 08/07/2017, 9:51 AM

## 2017-08-21 ENCOUNTER — Inpatient Hospital Stay (HOSPITAL_COMMUNITY)
Admission: EM | Admit: 2017-08-21 | Discharge: 2017-08-26 | DRG: 291 | Disposition: A | Discharge status: Home / Self Care | Payer: Medicare HMO | Attending: Internal Medicine | Admitting: Internal Medicine

## 2017-08-21 ENCOUNTER — Encounter (HOSPITAL_COMMUNITY): Payer: Self-pay | Admitting: Emergency Medicine

## 2017-08-21 ENCOUNTER — Emergency Department (HOSPITAL_COMMUNITY): Payer: Medicare HMO

## 2017-08-21 DIAGNOSIS — Z6833 Body mass index (BMI) 33.0-33.9, adult: Secondary | ICD-10-CM

## 2017-08-21 DIAGNOSIS — I361 Nonrheumatic tricuspid (valve) insufficiency: Secondary | ICD-10-CM | POA: Diagnosis not present

## 2017-08-21 DIAGNOSIS — I130 Hypertensive heart and chronic kidney disease with heart failure and stage 1 through stage 4 chronic kidney disease, or unspecified chronic kidney disease: Principal | ICD-10-CM | POA: Diagnosis present

## 2017-08-21 DIAGNOSIS — Z905 Acquired absence of kidney: Secondary | ICD-10-CM | POA: Diagnosis not present

## 2017-08-21 DIAGNOSIS — Z87891 Personal history of nicotine dependence: Secondary | ICD-10-CM | POA: Diagnosis not present

## 2017-08-21 DIAGNOSIS — Z7982 Long term (current) use of aspirin: Secondary | ICD-10-CM | POA: Diagnosis not present

## 2017-08-21 DIAGNOSIS — Z79899 Other long term (current) drug therapy: Secondary | ICD-10-CM

## 2017-08-21 DIAGNOSIS — Z9981 Dependence on supplemental oxygen: Secondary | ICD-10-CM

## 2017-08-21 DIAGNOSIS — G4733 Obstructive sleep apnea (adult) (pediatric): Secondary | ICD-10-CM | POA: Diagnosis present

## 2017-08-21 DIAGNOSIS — E1122 Type 2 diabetes mellitus with diabetic chronic kidney disease: Secondary | ICD-10-CM | POA: Diagnosis present

## 2017-08-21 DIAGNOSIS — J411 Mucopurulent chronic bronchitis: Secondary | ICD-10-CM | POA: Diagnosis not present

## 2017-08-21 DIAGNOSIS — J9621 Acute and chronic respiratory failure with hypoxia: Secondary | ICD-10-CM | POA: Diagnosis not present

## 2017-08-21 DIAGNOSIS — E232 Diabetes insipidus: Secondary | ICD-10-CM | POA: Diagnosis not present

## 2017-08-21 DIAGNOSIS — I351 Nonrheumatic aortic (valve) insufficiency: Secondary | ICD-10-CM | POA: Diagnosis present

## 2017-08-21 DIAGNOSIS — R601 Generalized edema: Secondary | ICD-10-CM | POA: Diagnosis not present

## 2017-08-21 DIAGNOSIS — E039 Hypothyroidism, unspecified: Secondary | ICD-10-CM | POA: Diagnosis present

## 2017-08-21 DIAGNOSIS — I5043 Acute on chronic combined systolic (congestive) and diastolic (congestive) heart failure: Secondary | ICD-10-CM | POA: Diagnosis not present

## 2017-08-21 DIAGNOSIS — I10 Essential (primary) hypertension: Secondary | ICD-10-CM | POA: Diagnosis not present

## 2017-08-21 DIAGNOSIS — N184 Chronic kidney disease, stage 4 (severe): Secondary | ICD-10-CM | POA: Diagnosis present

## 2017-08-21 DIAGNOSIS — G40909 Epilepsy, unspecified, not intractable, without status epilepticus: Secondary | ICD-10-CM | POA: Diagnosis not present

## 2017-08-21 DIAGNOSIS — J441 Chronic obstructive pulmonary disease with (acute) exacerbation: Secondary | ICD-10-CM | POA: Diagnosis present

## 2017-08-21 DIAGNOSIS — N400 Enlarged prostate without lower urinary tract symptoms: Secondary | ICD-10-CM | POA: Diagnosis present

## 2017-08-21 DIAGNOSIS — Z9119 Patient's noncompliance with other medical treatment and regimen: Secondary | ICD-10-CM

## 2017-08-21 DIAGNOSIS — J449 Chronic obstructive pulmonary disease, unspecified: Secondary | ICD-10-CM

## 2017-08-21 DIAGNOSIS — E230 Hypopituitarism: Secondary | ICD-10-CM | POA: Diagnosis present

## 2017-08-21 DIAGNOSIS — I4510 Unspecified right bundle-branch block: Secondary | ICD-10-CM | POA: Diagnosis present

## 2017-08-21 DIAGNOSIS — J42 Unspecified chronic bronchitis: Secondary | ICD-10-CM | POA: Diagnosis not present

## 2017-08-21 DIAGNOSIS — J410 Simple chronic bronchitis: Secondary | ICD-10-CM | POA: Diagnosis not present

## 2017-08-21 DIAGNOSIS — R0602 Shortness of breath: Secondary | ICD-10-CM | POA: Diagnosis not present

## 2017-08-21 DIAGNOSIS — I272 Pulmonary hypertension, unspecified: Secondary | ICD-10-CM | POA: Diagnosis present

## 2017-08-21 LAB — URINALYSIS, ROUTINE W REFLEX MICROSCOPIC
BILIRUBIN URINE: NEGATIVE
GLUCOSE, UA: NEGATIVE mg/dL
Hgb urine dipstick: NEGATIVE
Ketones, ur: NEGATIVE mg/dL
LEUKOCYTES UA: NEGATIVE
NITRITE: NEGATIVE
Protein, ur: NEGATIVE mg/dL
SPECIFIC GRAVITY, URINE: 1.006 (ref 1.005–1.030)
pH: 6 (ref 5.0–8.0)

## 2017-08-21 LAB — CBC
HEMATOCRIT: 30.9 % — AB (ref 39.0–52.0)
HEMOGLOBIN: 9.5 g/dL — AB (ref 13.0–17.0)
MCH: 30.3 pg (ref 26.0–34.0)
MCHC: 30.7 g/dL (ref 30.0–36.0)
MCV: 98.4 fL (ref 78.0–100.0)
PLATELETS: 166 10*3/uL (ref 150–400)
RBC: 3.14 MIL/uL — AB (ref 4.22–5.81)
RDW: 16.7 % — ABNORMAL HIGH (ref 11.5–15.5)
WBC: 7.2 10*3/uL (ref 4.0–10.5)

## 2017-08-21 LAB — BASIC METABOLIC PANEL
ANION GAP: 8 (ref 5–15)
BUN: 66 mg/dL — ABNORMAL HIGH (ref 6–20)
CHLORIDE: 103 mmol/L (ref 101–111)
CO2: 27 mmol/L (ref 22–32)
CREATININE: 2.79 mg/dL — AB (ref 0.61–1.24)
Calcium: 8.5 mg/dL — ABNORMAL LOW (ref 8.9–10.3)
GFR calc non Af Amer: 21 mL/min — ABNORMAL LOW (ref >60)
GFR, EST AFRICAN AMERICAN: 25 mL/min — AB (ref >60)
Glucose, Bld: 150 mg/dL — ABNORMAL HIGH (ref 65–99)
POTASSIUM: 5 mmol/L (ref 3.5–5.1)
SODIUM: 138 mmol/L (ref 135–145)

## 2017-08-21 LAB — GLUCOSE, CAPILLARY: Glucose-Capillary: 120 mg/dL — ABNORMAL HIGH (ref 65–99)

## 2017-08-21 LAB — TROPONIN I
Troponin I: 0.03 ng/mL (ref <0.03)
Troponin I: 0.03 ng/mL (ref <0.03)

## 2017-08-21 LAB — BRAIN NATRIURETIC PEPTIDE: B Natriuretic Peptide: 1211 pg/mL — ABNORMAL HIGH (ref 0.0–100.0)

## 2017-08-21 MED ORDER — ONDANSETRON HCL 4 MG/2ML IJ SOLN
4.0000 mg | Freq: Four times a day (QID) | INTRAMUSCULAR | Status: DC | PRN
  Filled 2017-08-21: qty 2

## 2017-08-21 MED ORDER — PHENYTOIN SODIUM EXTENDED 100 MG PO CAPS
100.0000 mg | ORAL_CAPSULE | Freq: Three times a day (TID) | ORAL | Status: DC
  Administered 2017-08-21 – 2017-08-26 (×14): 100 mg via ORAL
  Filled 2017-08-21 (×14): qty 1

## 2017-08-21 MED ORDER — LOPERAMIDE HCL 2 MG PO CAPS: 2.0000 mg | ORAL_CAPSULE | Freq: Four times a day (QID) | ORAL | Status: DC | PRN

## 2017-08-21 MED ORDER — SIMVASTATIN 20 MG PO TABS
20.0000 mg | ORAL_TABLET | Freq: Every day | ORAL | Status: DC
  Administered 2017-08-22 – 2017-08-25 (×4): 20 mg via ORAL
  Filled 2017-08-21 (×4): qty 1

## 2017-08-21 MED ORDER — TAMSULOSIN HCL 0.4 MG PO CAPS
0.4000 mg | ORAL_CAPSULE | Freq: Every day | ORAL | Status: DC
  Administered 2017-08-22 – 2017-08-25 (×4): 0.4 mg via ORAL
  Filled 2017-08-21 (×4): qty 1

## 2017-08-21 MED ORDER — ISOSORB DINITRATE-HYDRALAZINE 20-37.5 MG PO TABS
1.0000 | ORAL_TABLET | Freq: Three times a day (TID) | ORAL | Status: DC
  Administered 2017-08-21 – 2017-08-26 (×14): 1 via ORAL
  Filled 2017-08-21 (×21): qty 1

## 2017-08-21 MED ORDER — DESMOPRESSIN ACETATE 0.2 MG PO TABS
0.2000 mg | ORAL_TABLET | Freq: Two times a day (BID) | ORAL | Status: DC
  Administered 2017-08-21 – 2017-08-26 (×10): 0.2 mg via ORAL
  Filled 2017-08-21 (×15): qty 1

## 2017-08-21 MED ORDER — IPRATROPIUM-ALBUTEROL 0.5-2.5 (3) MG/3ML IN SOLN
3.0000 mL | RESPIRATORY_TRACT | Status: DC | PRN
Start: 2017-08-21 — End: 2017-08-26
  Administered 2017-08-25: 3 mL via RESPIRATORY_TRACT
  Filled 2017-08-21: qty 3

## 2017-08-21 MED ORDER — LEVOTHYROXINE SODIUM 75 MCG PO TABS
150.0000 ug | ORAL_TABLET | Freq: Every day | ORAL | Status: DC
  Administered 2017-08-22 – 2017-08-26 (×5): 150 ug via ORAL
  Filled 2017-08-21 (×5): qty 2

## 2017-08-21 MED ORDER — ALBUTEROL SULFATE (2.5 MG/3ML) 0.083% IN NEBU
2.5000 mg | INHALATION_SOLUTION | Freq: Four times a day (QID) | RESPIRATORY_TRACT | Status: DC
  Administered 2017-08-21 – 2017-08-26 (×19): 2.5 mg via RESPIRATORY_TRACT
  Filled 2017-08-21 (×21): qty 3

## 2017-08-21 MED ORDER — ENOXAPARIN SODIUM 40 MG/0.4ML ~~LOC~~ SOLN
40.0000 mg | SUBCUTANEOUS | Status: DC
  Administered 2017-08-21 – 2017-08-25 (×5): 40 mg via SUBCUTANEOUS
  Filled 2017-08-21 (×5): qty 0.4

## 2017-08-21 MED ORDER — ASPIRIN EC 81 MG PO TBEC
81.0000 mg | DELAYED_RELEASE_TABLET | Freq: Every day | ORAL | Status: DC
  Administered 2017-08-22 – 2017-08-26 (×5): 81 mg via ORAL
  Filled 2017-08-21 (×5): qty 1

## 2017-08-21 MED ORDER — CALCITRIOL 0.25 MCG PO CAPS
0.5000 ug | ORAL_CAPSULE | Freq: Every day | ORAL | Status: DC
  Administered 2017-08-22 – 2017-08-26 (×5): 0.5 ug via ORAL
  Filled 2017-08-21 (×5): qty 2

## 2017-08-21 MED ORDER — SODIUM CHLORIDE 0.9% FLUSH
3.0000 mL | Freq: Two times a day (BID) | INTRAVENOUS | Status: DC
  Administered 2017-08-21 – 2017-08-25 (×9): 3 mL via INTRAVENOUS

## 2017-08-21 MED ORDER — DIPHENOXYLATE-ATROPINE 2.5-0.025 MG PO TABS: 1.0000 | ORAL_TABLET | Freq: Four times a day (QID) | ORAL | Status: DC | PRN

## 2017-08-21 MED ORDER — IPRATROPIUM-ALBUTEROL 0.5-2.5 (3) MG/3ML IN SOLN
3.0000 mL | Freq: Two times a day (BID) | RESPIRATORY_TRACT | Status: DC
  Administered 2017-08-21 – 2017-08-22 (×2): 3 mL via RESPIRATORY_TRACT
  Filled 2017-08-21 (×2): qty 3

## 2017-08-21 MED ORDER — SODIUM CHLORIDE 0.9 % IV SOLN: 250.0000 mL | INTRAVENOUS | Status: DC | PRN

## 2017-08-21 MED ORDER — ACETAMINOPHEN 325 MG PO TABS: 650.0000 mg | ORAL_TABLET | ORAL | Status: DC | PRN

## 2017-08-21 MED ORDER — CLONIDINE HCL 0.2 MG PO TABS
0.2000 mg | ORAL_TABLET | Freq: Three times a day (TID) | ORAL | Status: DC
  Administered 2017-08-21 – 2017-08-26 (×14): 0.2 mg via ORAL
  Filled 2017-08-21 (×14): qty 1

## 2017-08-21 MED ORDER — TIOTROPIUM BROMIDE MONOHYDRATE 18 MCG IN CAPS
1.0000 | ORAL_CAPSULE | Freq: Every day | RESPIRATORY_TRACT | Status: DC
  Administered 2017-08-22 – 2017-08-26 (×4): 18 ug via RESPIRATORY_TRACT
  Filled 2017-08-21 (×2): qty 5

## 2017-08-21 MED ORDER — METOLAZONE 5 MG PO TABS
5.0000 mg | ORAL_TABLET | Freq: Once | ORAL | Status: AC
  Administered 2017-08-21: 5 mg via ORAL
  Filled 2017-08-21 (×2): qty 1

## 2017-08-21 MED ORDER — COLCHICINE 0.6 MG PO TABS
0.6000 mg | ORAL_TABLET | Freq: Every day | ORAL | Status: DC
  Administered 2017-08-22 – 2017-08-26 (×5): 0.6 mg via ORAL
  Filled 2017-08-21 (×5): qty 1

## 2017-08-21 MED ORDER — POTASSIUM CHLORIDE CRYS ER 20 MEQ PO TBCR
20.0000 meq | EXTENDED_RELEASE_TABLET | Freq: Every day | ORAL | Status: DC
  Administered 2017-08-22 – 2017-08-23 (×2): 20 meq via ORAL
  Filled 2017-08-21 (×2): qty 1

## 2017-08-21 MED ORDER — LEVETIRACETAM 250 MG PO TABS
250.0000 mg | ORAL_TABLET | Freq: Two times a day (BID) | ORAL | Status: DC
  Administered 2017-08-21 – 2017-08-26 (×10): 250 mg via ORAL
  Filled 2017-08-21 (×10): qty 1

## 2017-08-21 MED ORDER — AMLODIPINE BESYLATE 5 MG PO TABS
10.0000 mg | ORAL_TABLET | Freq: Every day | ORAL | Status: DC
  Administered 2017-08-22 – 2017-08-26 (×5): 10 mg via ORAL
  Filled 2017-08-21 (×5): qty 2

## 2017-08-21 MED ORDER — DEXAMETHASONE 0.5 MG PO TABS
1.5000 mg | ORAL_TABLET | Freq: Every day | ORAL | Status: DC
  Administered 2017-08-22 – 2017-08-26 (×5): 1.5 mg via ORAL
  Filled 2017-08-21 (×2): qty 3
  Filled 2017-08-21: qty 1
  Filled 2017-08-21: qty 3
  Filled 2017-08-21: qty 1
  Filled 2017-08-21: qty 3
  Filled 2017-08-21: qty 1

## 2017-08-21 MED ORDER — PREDNISONE 10 MG (21) PO TBPK: ORAL_TABLET | Freq: Every day | ORAL | Status: DC

## 2017-08-21 MED ORDER — SODIUM CHLORIDE 0.9% FLUSH
3.0000 mL | INTRAVENOUS | Status: DC | PRN
  Administered 2017-08-22: 3 mL via INTRAVENOUS
  Filled 2017-08-21: qty 3

## 2017-08-21 MED ORDER — FUROSEMIDE 10 MG/ML IJ SOLN
80.0000 mg | Freq: Three times a day (TID) | INTRAMUSCULAR | Status: DC
  Administered 2017-08-22 – 2017-08-26 (×12): 80 mg via INTRAVENOUS
  Filled 2017-08-21 (×12): qty 8

## 2017-08-21 MED ORDER — METOLAZONE 5 MG PO TABS
5.0000 mg | ORAL_TABLET | Freq: Two times a day (BID) | ORAL | Status: DC
  Administered 2017-08-22 – 2017-08-25 (×7): 5 mg via ORAL
  Filled 2017-08-21 (×7): qty 1

## 2017-08-21 MED ORDER — FUROSEMIDE 10 MG/ML IJ SOLN
80.0000 mg | Freq: Once | INTRAMUSCULAR | Status: AC
  Administered 2017-08-21: 80 mg via INTRAVENOUS
  Filled 2017-08-21: qty 8

## 2017-08-21 MED ORDER — PREDNISONE 10 MG PO TABS
10.0000 mg | ORAL_TABLET | Freq: Every day | ORAL | Status: AC
  Administered 2017-08-22 – 2017-08-24 (×3): 10 mg via ORAL
  Filled 2017-08-21 (×3): qty 1

## 2017-08-21 NOTE — ED Notes (Signed)
family and pt requesting to see EDP, EDP made aware

## 2017-08-21 NOTE — ED Notes (Signed)
Date and time results received: 08/21/17 4:39 PM  (use smartphrase ".now" to insert current time)  Test: Troponin Critical Value: 0.03  Name of Provider Notified: [place]  Orders Received? Or Actions Taken?: Orders Received - See Orders for details
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
## 2017-08-21 NOTE — ED Notes (Signed)
Patient transported to X-ray 

## 2017-08-21 NOTE — ED Triage Notes (Signed)
Pt c/o increased swelling in legs and abdomen and increasing sob.
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
## 2017-08-22 ENCOUNTER — Inpatient Hospital Stay (HOSPITAL_COMMUNITY): Payer: Medicare HMO

## 2017-08-22 DIAGNOSIS — I361 Nonrheumatic tricuspid (valve) insufficiency: Secondary | ICD-10-CM

## 2017-08-22 DIAGNOSIS — J410 Simple chronic bronchitis: Secondary | ICD-10-CM

## 2017-08-22 LAB — BASIC METABOLIC PANEL
ANION GAP: 9 (ref 5–15)
BUN: 70 mg/dL — ABNORMAL HIGH (ref 6–20)
CHLORIDE: 103 mmol/L (ref 101–111)
CO2: 27 mmol/L (ref 22–32)
Calcium: 8.4 mg/dL — ABNORMAL LOW (ref 8.9–10.3)
Creatinine, Ser: 2.62 mg/dL — ABNORMAL HIGH (ref 0.61–1.24)
GFR calc Af Amer: 26 mL/min — ABNORMAL LOW (ref >60)
GFR, EST NON AFRICAN AMERICAN: 23 mL/min — AB (ref >60)
Glucose, Bld: 96 mg/dL (ref 65–99)
POTASSIUM: 4.4 mmol/L (ref 3.5–5.1)
Sodium: 139 mmol/L (ref 135–145)

## 2017-08-22 LAB — GLUCOSE, CAPILLARY
GLUCOSE-CAPILLARY: 110 mg/dL — AB (ref 65–99)
GLUCOSE-CAPILLARY: 114 mg/dL — AB (ref 65–99)
Glucose-Capillary: 122 mg/dL — ABNORMAL HIGH (ref 65–99)
Glucose-Capillary: 92 mg/dL (ref 65–99)

## 2017-08-22 LAB — ECHOCARDIOGRAM COMPLETE
Height: 73 in
Weight: 4038.4 oz

## 2017-08-22 NOTE — Progress Notes (Addendum)
[facility] PROGRESS NOTE  [name] [name] KVQ:[id number] DOB: [birth year] DOA: 08/21/2017   PCP: [name], MD     Assessment/Plan: Principal Problem:   Acute and chronic respiratory failure with hypoxia (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 He came to the emergency department with increasing shortness of breath and lower extremity edema was found to have volume overload chest x-ray showing pulmonary edema . Patient admitted with COPD CHF exacerbation  Assessment and plan Acute on chronic respiratory failure with hypoxia due to chronic combined systolic and diastolic heart failure and COPD exacerbation -Known ejection fraction of 45-50% per echo in March 2018. Repeating 2-D echo to see if things have changed -Strict I's and O's, daily weights, Lasix 80 mg TID  daily and adjust according to output. -Not on ACE inhibitor due to advanced kidney failure. weight 254>252lbs Chest x-ray shows pulmonary vascular congestion and right pleural effusion, continue diuresis  Panhypopituitarism -Continue steroids, Synthroid replacement. Also on monthly testosterone replacement.  Diabetes insipidus -On DDAVP.  Seizure disorder -Continue Keppra and Dilantin.  Chronic kidney disease stage  IV -Creatinine remains at baseline of around 2.2-2.5.    DVT prophylaxsis  Lovenox  Code Status:  Full code   Family Communication: Discussed in detail with the patient, all imaging results, lab results explained to the patient   Disposition Plan:  Continue as inpatient, anticipate discharge in one to 2 days       Consultants: Pulmonary  Procedures:  None  Antibiotics: Anti-infectives    None         HPI/Subjective: Patient currently on 2 L of oxygen, minimal orthopnea, morbidly obese denies any chest pain shortness of breath  Objective: Vitals:   08/21/17 2115 08/21/17 2225 08/22/17 0621 08/22/17 0736  BP: (!) 126/51  (!) 117/50   Pulse: 71  74   Resp: (!) 21  (!) 22   Temp: 98.4 F (36.9 C)  98.6 F (37 C)   TempSrc: Oral  Oral   SpO2: 100% 96% 100% 97%  Weight: 114.7 kg (252 lb 14.4 oz)  114.5 kg (252 lb 6.4 oz)   Height:        Intake/Output Summary (Last 24 hours) at 08/22/17 1012 Last data filed at 08/22/17 0818  Gross per 24 hour  Intake              123 ml  Output             1500 ml  Net            -1377 ml    Exam:  Examination:  General exam: Appears calm and comfortable  Respiratory system: Clear to auscultation. Respiratory effort normal. Cardiovascular system: S1 & S2 heard, RRR. No JVD, murmurs, rubs, gallops or clicks.2+ pitting edema Gastrointestinal system: Abdomen is nondistended, soft and nontender. No organomegaly  or masses felt. Normal bowel sounds heard. Central nervous system: Alert and oriented. No focal neurological deficits. Extremities: Symmetric 5 x 5 power. Skin: No rashes, lesions or ulcers Psychiatry: Judgement and insight appear normal. Mood & affect appropriate.     Data Reviewed: I have personally reviewed following labs and imaging studies  Micro Results No results found for this or any previous visit (from the past 240 hour(s)).  Radiology Reports Dg Chest 2 View  Result Date: 08/21/2017 CLINICAL  DATA:  Shortness of breath, increased swelling in BILATERAL lower extremities and abdomen, history CHF, diabetes mellitus, hypertension, COPD EXAM: CHEST  2 VIEW COMPARISON:  08/04/2017 FINDINGS: Enlargement of cardiac silhouette with pulmonary vascular congestion. Stable mediastinal contours. New RIGHT pleural effusion and RIGHT basilar atelectasis. No definite acute infiltrate, pneumothorax or LEFT pleural effusion. Bones unremarkable. IMPRESSION: Enlargement of cardiac silhouette with pulmonary vascular congestion. New RIGHT pleural effusion and basilar atelectasis. Electronically Signed   By: Lavonia Dana M.D.   On: 08/21/2017 18:00   Dg Chest 2 View  Result Date: 08/04/2017 CLINICAL DATA:  Eck of shortness of breath. History of CHF, aortic insufficiency COPD, hypertension, former smoker. Previous nephrectomy for malignancy. EXAM: CHEST  2 VIEW COMPARISON:  Chest x-ray of June 04, 2017 FINDINGS: The cardiac silhouette is enlarged. The pulmonary vascularity is engorged and less distinct today. The lungs are adequately inflated. There is no alveolar infiltrate or pleural effusion. There is calcification in the wall of the aortic arch. There is multilevel degenerative disc disease of the thoracic spine. IMPRESSION: CHF with mild pulmonary vascular congestion and mild interstitial edema. This has worsened since the previous study. No alveolar pneumonia. Thoracic aortic atherosclerosis. Electronically Signed   By: David  Martinique M.D.   On: 08/04/2017 09:31     CBC  Recent Labs Lab 08/21/17 1544  WBC 7.2  HGB 9.5*  HCT 30.9*  PLT 166  MCV 98.4  MCH 30.3  MCHC 30.7  RDW 16.7*    Chemistries   Recent Labs Lab 08/21/17 1544 08/22/17 0611  NA 138 139  K 5.0 4.4  CL 103 103  CO2 27 27  GLUCOSE 150* 96  BUN 66* 70*  CREATININE 2.79* 2.62*  CALCIUM 8.5* 8.4*   ------------------------------------------------------------------------------------------------------------------ estimated  creatinine clearance is 33.8 mL/min (A) (by C-G formula based on SCr of 2.62 mg/dL (H)). ------------------------------------------------------------------------------------------------------------------ No results for input(s): HGBA1C in the last 72 hours. ------------------------------------------------------------------------------------------------------------------ No results for input(s): CHOL, HDL, LDLCALC, TRIG, CHOLHDL, LDLDIRECT in the last 72 hours. ------------------------------------------------------------------------------------------------------------------ No results for input(s): TSH, T4TOTAL, T3FREE, THYROIDAB in the last 72 hours.  Invalid input(s): FREET3 ------------------------------------------------------------------------------------------------------------------ No results for input(s): VITAMINB12, FOLATE, FERRITIN, TIBC, IRON, RETICCTPCT in the last 72 hours.  Coagulation profile No results for input(s): INR, PROTIME in the last 168 hours.  No results for input(s): DDIMER in the last 72 hours.  Cardiac Enzymes  Recent Labs Lab 08/21/17 1544 08/21/17 1853  TROPONINI 0.03* 0.03*   ------------------------------------------------------------------------------------------------------------------ Invalid input(s): POCBNP   CBG:  Recent Labs Lab 08/21/17 2114 08/22/17 0728  GLUCAP 120* 110*       Studies: Dg Chest 2 View  Result Date: 08/21/2017 CLINICAL DATA:  Shortness of breath, increased swelling in BILATERAL lower extremities and abdomen, history CHF, diabetes mellitus, hypertension, COPD EXAM: CHEST  2 VIEW COMPARISON:  08/04/2017 FINDINGS: Enlargement of cardiac silhouette with pulmonary vascular congestion. Stable mediastinal contours. New RIGHT pleural effusion and RIGHT basilar atelectasis. No definite acute infiltrate, pneumothorax or LEFT pleural effusion. Bones unremarkable. IMPRESSION:  Enlargement of cardiac silhouette with pulmonary  vascular congestion. New RIGHT pleural effusion and basilar atelectasis. Electronically Signed   By: Lavonia Dana M.D.   On: 08/21/2017 18:00      Lab Results  Component Value Date   HGBA1C 5.8 (H) 01/22/2017   HGBA1C 5.4 10/29/2016   HGBA1C 6.0 (H) 07/20/2016   Lab Results  Component Value Date   LDLCALC 82 03/22/2013   CREATININE 2.62 (H) 08/22/2017       Scheduled Meds: . albuterol  2.5 mg Nebulization QID  . amLODipine  10 mg Oral Daily  . aspirin EC  81 mg Oral Daily  . calcitRIOL  0.5 mcg Oral Daily  . cloNIDine  0.2 mg Oral TID  . colchicine  0.6 mg Oral Daily  . desmopressin  0.2 mg Oral BID  . dexamethasone  1.5 mg Oral Daily  . enoxaparin (LOVENOX) injection  40 mg Subcutaneous Q24H  . furosemide  80 mg Intravenous TID  . ipratropium-albuterol  3 mL Nebulization BID  . isosorbide-hydrALAZINE  1 tablet Oral TID  . levETIRAcetam  250 mg Oral BID  . levothyroxine  150 mcg Oral QAC breakfast  . metolazone  5 mg Oral BID  . phenytoin  100 mg Oral TID  . potassium chloride SA  20 mEq Oral Daily  . predniSONE  10 mg Oral Q breakfast  . simvastatin  20 mg Oral q1800  . sodium chloride flush  3 mL Intravenous Q12H  . tamsulosin  0.4 mg Oral QPC supper  . tiotropium  1 capsule Inhalation Daily   Continuous Infusions: . sodium chloride       LOS: 1 day    Time spent: >30 MINS    Reyne Dumas  [facility] Pager [phone]. If 7PM-7AM, please contact night-coverage at [email or web address], password [facility] 08/22/2017, 10:12 AM  LOS: 1 day

## 2017-08-22 NOTE — Progress Notes (Signed)
*  PRELIMINARY RESULTS* Echocardiogram 2D Echocardiogram has been performed.  Leavy Cella 08/22/2017, 10:15 AM

## 2017-08-23 LAB — GLUCOSE, CAPILLARY
GLUCOSE-CAPILLARY: 127 mg/dL — AB (ref 65–99)
GLUCOSE-CAPILLARY: 98 mg/dL (ref 65–99)
Glucose-Capillary: 99 mg/dL (ref 65–99)
Glucose-Capillary: 113 mg/dL — ABNORMAL HIGH (ref 65–99)

## 2017-08-23 LAB — COMPREHENSIVE METABOLIC PANEL
ALK PHOS: 52 U/L (ref 38–126)
ALT: 45 U/L (ref 17–63)
ANION GAP: 9 (ref 5–15)
AST: 18 U/L (ref 15–41)
Albumin: 3.5 g/dL (ref 3.5–5.0)
BILIRUBIN TOTAL: 0.5 mg/dL (ref 0.3–1.2)
BUN: 73 mg/dL — ABNORMAL HIGH (ref 6–20)
CALCIUM: 8.4 mg/dL — AB (ref 8.9–10.3)
CO2: 28 mmol/L (ref 22–32)
Chloride: 100 mmol/L — ABNORMAL LOW (ref 101–111)
Creatinine, Ser: 2.51 mg/dL — ABNORMAL HIGH (ref 0.61–1.24)
GFR calc non Af Amer: 24 mL/min — ABNORMAL LOW (ref >60)
GFR, EST AFRICAN AMERICAN: 28 mL/min — AB (ref >60)
Glucose, Bld: 97 mg/dL (ref 65–99)
POTASSIUM: 3.9 mmol/L (ref 3.5–5.1)
SODIUM: 137 mmol/L (ref 135–145)
TOTAL PROTEIN: 6.5 g/dL (ref 6.5–8.1)

## 2017-08-23 LAB — CBC
HEMATOCRIT: 29.6 % — AB (ref 39.0–52.0)
HEMOGLOBIN: 9.4 g/dL — AB (ref 13.0–17.0)
MCH: 30.7 pg (ref 26.0–34.0)
MCHC: 31.8 g/dL (ref 30.0–36.0)
MCV: 96.7 fL (ref 78.0–100.0)
Platelets: 163 10*3/uL (ref 150–400)
RBC: 3.06 MIL/uL — ABNORMAL LOW (ref 4.22–5.81)
RDW: 16.4 % — AB (ref 11.5–15.5)
WBC: 7.8 10*3/uL (ref 4.0–10.5)

## 2017-08-23 LAB — MAGNESIUM: Magnesium: 2.3 mg/dL (ref 1.7–2.4)

## 2017-08-23 MED ORDER — METOLAZONE 5 MG PO TABS: 2.5000 mg | ORAL_TABLET | Freq: Every day | ORAL | Status: DC

## 2017-08-23 MED ORDER — POTASSIUM CHLORIDE CRYS ER 20 MEQ PO TBCR
40.0000 meq | EXTENDED_RELEASE_TABLET | Freq: Once | ORAL | Status: AC
  Administered 2017-08-23: 40 meq via ORAL
  Filled 2017-08-23: qty 2

## 2017-08-23 NOTE — Progress Notes (Addendum)
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 He came to the emergency department with increasing shortness of breath and lower extremity edema was found to have volume overload chest x-ray showing pulmonary edema . Patient admitted with COPD CHF exacerbation  Assessment and plan Acute on chronic respiratory failure with hypoxia due to chronic combined systolic and diastolic heart failure and COPD exacerbation -Known ejection fraction of 45-50% per echo in March 2018. Repeat 2-D echo unchanged -Strict I's and O's, patient diuresed 4475 cc in the last 24 hours, daily weights, Lasix 80 mg TID  daily along with Zaroxolyn 5 mg twice a day -Not on ACE inhibitor due to advanced kidney failure. weight 254>252lbs Dyspnea has improved  Panhypopituitarism -Continue steroids, Synthroid replacement. Also on monthly testosterone replacement.  Diabetes insipidus -On DDAVP.  Seizure disorder -Continue Keppra and Dilantin.  Chronic kidney disease stage IV -Creatinine remains at baseline ,2.62>2.51    DVT prophylaxsis  Lovenox  Code Status:  Full code   Family  Communication: Discussed in detail with the patient, all imaging results, lab results explained to the patient   Disposition Plan:  Continue as inpatient, anticipate discharge in one to 2 days       Consultants: Pulmonary  Procedures:  None  Antibiotics: Anti-infectives    None         HPI/Subjective: Patient currently on 2 L of oxygen,   denies any chest pain shortness of breath  Objective: Vitals:   08/22/17 2123 08/23/17 0445 08/23/17 0750 08/23/17 0817  BP: (!) 130/50 (!) 130/56  (!) 120/50  Pulse: 74 77  76  Resp: 16 16    Temp: 98.5 F (36.9 C) 98.4 F (36.9 C)    TempSrc: Oral Oral    SpO2: 100% 97% 98% 98%  Weight:  115.6 kg (254 lb 13.6 oz)    Height:        Intake/Output Summary (Last 24 hours) at 08/23/17 1119 Last data filed at 08/23/17 0447  Gross per 24 hour  Intake              483 ml  Output             3900 ml  Net            -3417 ml    Exam:  Examination:  General exam: Appears calm and comfortable  Respiratory system: Clear to auscultation. Respiratory effort normal. Cardiovascular system: S1 & S2 heard, RRR. No JVD, murmurs, rubs, gallops or clicks.2+ pitting edema Gastrointestinal system: Abdomen is nondistended, soft and nontender. No organomegaly or masses felt. Normal bowel sounds heard. Central nervous system: Alert and oriented. No focal neurological deficits. Extremities: Symmetric 5 x 5 power.  Skin: No rashes, lesions or ulcers Psychiatry: Judgement and insight appear normal. Mood & affect appropriate.     Data Reviewed: I have personally reviewed following labs and imaging studies  Micro Results No results found for this or any previous visit (from the past 240 hour(s)).  Radiology Reports Dg Chest 2 View  Result Date: 08/21/2017 CLINICAL DATA:  Shortness of breath, increased swelling in BILATERAL lower extremities and abdomen, history CHF, diabetes mellitus, hypertension, COPD EXAM: CHEST  2 VIEW COMPARISON:   08/04/2017 FINDINGS: Enlargement of cardiac silhouette with pulmonary vascular congestion. Stable mediastinal contours. New RIGHT pleural effusion and RIGHT basilar atelectasis. No definite acute infiltrate, pneumothorax or LEFT pleural effusion. Bones unremarkable. IMPRESSION: Enlargement of cardiac silhouette with pulmonary vascular congestion. New RIGHT pleural effusion and basilar atelectasis. Electronically Signed   By: Lavonia Dana M.D.   On: 08/21/2017 18:00   Dg Chest 2 View  Result Date: 08/04/2017 CLINICAL DATA:  Eck of shortness of breath. History of CHF, aortic insufficiency COPD, hypertension, former smoker. Previous nephrectomy for malignancy. EXAM: CHEST  2 VIEW COMPARISON:  Chest x-ray of June 04, 2017 FINDINGS: The cardiac silhouette is enlarged. The pulmonary vascularity is engorged and less distinct today. The lungs are adequately inflated. There is no alveolar infiltrate or pleural effusion. There is calcification in the wall of the aortic arch. There is multilevel degenerative disc disease of the thoracic spine. IMPRESSION: CHF with mild pulmonary vascular congestion and mild interstitial edema. This has worsened since the previous study. No alveolar pneumonia. Thoracic aortic atherosclerosis. Electronically Signed   By: David  Martinique M.D.   On: 08/04/2017 09:31     CBC  Recent Labs Lab 08/21/17 1544 08/23/17 0529  WBC 7.2 7.8  HGB 9.5* 9.4*  HCT 30.9* 29.6*  PLT 166 163  MCV 98.4 96.7  MCH 30.3 30.7  MCHC 30.7 31.8  RDW 16.7* 16.4*    Chemistries   Recent Labs Lab 08/21/17 1544 08/22/17 0611 08/23/17 0529  NA 138 139 137  K 5.0 4.4 3.9  CL 103 103 100*  CO2 27 27 28   GLUCOSE 150* 96 97  BUN 66* 70* 73*  CREATININE 2.79* 2.62* 2.51*  CALCIUM 8.5* 8.4* 8.4*  MG  --   --  2.3  AST  --   --  18  ALT  --   --  45  ALKPHOS  --   --  52  BILITOT  --   --  0.5    ------------------------------------------------------------------------------------------------------------------ estimated creatinine clearance is 35.4 mL/min (A) (by C-G formula based on SCr of 2.51 mg/dL (H)). ------------------------------------------------------------------------------------------------------------------ No results for input(s): HGBA1C in the last 72 hours. ------------------------------------------------------------------------------------------------------------------ No results for input(s): CHOL, HDL, LDLCALC, TRIG, CHOLHDL, LDLDIRECT in the last 72 hours. ------------------------------------------------------------------------------------------------------------------ No results for input(s): TSH, T4TOTAL, T3FREE, THYROIDAB in the last 72 hours.  Invalid input(s): FREET3 ------------------------------------------------------------------------------------------------------------------ No results for input(s): VITAMINB12, FOLATE, FERRITIN, TIBC, IRON, RETICCTPCT in the last 72 hours.  Coagulation profile No results for input(s): INR, PROTIME in the last 168 hours.  No results for input(s): DDIMER in the last 72 hours.  Cardiac Enzymes  Recent Labs Lab 08/21/17 1544 08/21/17 1853  TROPONINI 0.03* 0.03*   ------------------------------------------------------------------------------------------------------------------ Invalid input(s): POCBNP   CBG:  Recent Labs Lab 08/22/17 0728 08/22/17 1106 08/22/17 1611 08/22/17 2124 08/23/17 0709  GLUCAP 110* 114* 122* 92 99       Studies: Dg Chest 2 View  Result Date: 08/21/2017 CLINICAL DATA:  Shortness of breath, increased swelling in  BILATERAL lower extremities and abdomen, history CHF, diabetes mellitus, hypertension, COPD EXAM: CHEST  2 VIEW COMPARISON:  08/04/2017 FINDINGS: Enlargement of cardiac silhouette with pulmonary vascular congestion. Stable mediastinal contours. New RIGHT pleural  effusion and RIGHT basilar atelectasis. No definite acute infiltrate, pneumothorax or LEFT pleural effusion. Bones unremarkable. IMPRESSION: Enlargement of cardiac silhouette with pulmonary vascular congestion. New RIGHT pleural effusion and basilar atelectasis. Electronically Signed   By: Lavonia Dana M.D.   On: 08/21/2017 18:00      Lab Results  Component Value Date   HGBA1C 5.8 (H) 01/22/2017   HGBA1C 5.4 10/29/2016   HGBA1C 6.0 (H) 07/20/2016   Lab Results  Component Value Date   LDLCALC 82 03/22/2013   CREATININE 2.51 (H) 08/23/2017       Scheduled Meds: . albuterol  2.5 mg Nebulization QID  . amLODipine  10 mg Oral Daily  . aspirin EC  81 mg Oral Daily  . calcitRIOL  0.5 mcg Oral Daily  . cloNIDine  0.2 mg Oral TID  . colchicine  0.6 mg Oral Daily  . desmopressin  0.2 mg Oral BID  . dexamethasone  1.5 mg Oral Daily  . enoxaparin (LOVENOX) injection  40 mg Subcutaneous Q24H  . furosemide  80 mg Intravenous TID  . isosorbide-hydrALAZINE  1 tablet Oral TID  . levETIRAcetam  250 mg Oral BID  . levothyroxine  150 mcg Oral QAC breakfast  . metolazone  5 mg Oral BID  . phenytoin  100 mg Oral TID  . potassium chloride SA  40 mEq Oral Once  . predniSONE  10 mg Oral Q breakfast  . simvastatin  20 mg Oral q1800  . sodium chloride flush  3 mL Intravenous Q12H  . tamsulosin  0.4 mg Oral QPC supper  . tiotropium  1 capsule Inhalation Daily   Continuous Infusions: . sodium chloride       LOS: 2 days    Time spent: >30 MINS    Reyne Dumas  [facility] Pager [phone]. If 7PM-7AM, please contact night-coverage at [email or web address], password [email or web address] 08/23/2017, 11:19 AM  LOS: 2 days

## 2017-08-24 LAB — COMPREHENSIVE METABOLIC PANEL
ALBUMIN: 3.6 g/dL (ref 3.5–5.0)
ALK PHOS: 42 U/L (ref 38–126)
ALT: 38 U/L (ref 17–63)
ANION GAP: 11 (ref 5–15)
AST: 14 U/L — ABNORMAL LOW (ref 15–41)
BILIRUBIN TOTAL: 0.4 mg/dL (ref 0.3–1.2)
BUN: 78 mg/dL — ABNORMAL HIGH (ref 6–20)
CALCIUM: 8.5 mg/dL — AB (ref 8.9–10.3)
CO2: 28 mmol/L (ref 22–32)
Chloride: 96 mmol/L — ABNORMAL LOW (ref 101–111)
Creatinine, Ser: 2.77 mg/dL — ABNORMAL HIGH (ref 0.61–1.24)
GFR calc non Af Amer: 21 mL/min — ABNORMAL LOW (ref >60)
GFR, EST AFRICAN AMERICAN: 25 mL/min — AB (ref >60)
GLUCOSE: 97 mg/dL (ref 65–99)
POTASSIUM: 4.5 mmol/L (ref 3.5–5.1)
Sodium: 135 mmol/L (ref 135–145)
TOTAL PROTEIN: 6.7 g/dL (ref 6.5–8.1)

## 2017-08-24 LAB — CBC
HEMATOCRIT: 31.1 % — AB (ref 39.0–52.0)
HEMOGLOBIN: 9.8 g/dL — AB (ref 13.0–17.0)
MCH: 30.3 pg (ref 26.0–34.0)
MCHC: 31.5 g/dL (ref 30.0–36.0)
MCV: 96.3 fL (ref 78.0–100.0)
Platelets: 163 10*3/uL (ref 150–400)
RBC: 3.23 MIL/uL — AB (ref 4.22–5.81)
RDW: 16.5 % — ABNORMAL HIGH (ref 11.5–15.5)
WBC: 7.7 10*3/uL (ref 4.0–10.5)

## 2017-08-24 NOTE — Progress Notes (Signed)
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 He came to the emergency department with increasing shortness of breath and lower extremity edema was found to have volume overload chest x-ray showing pulmonary edema . Patient admitted with COPD CHF exacerbation.  Over the past few days, patient's breathing continues to improve. ([name] normally on 2 L nasal cannula when necessary at home and is close to that now area). Patient down to 249 pounds from 254 on admission and diuresed and was 8 L of fluid. Patient states his breathing feels better.  Assessment and plan Acute on chronic respiratory failure with hypoxia due to chronic combined systolic and diastolic heart failure and COPD exacerbation -Known ejection fraction of 45-50% per echo in March 2018. Repeat 2-D echo unchanged -Strict I's and O's, patient diuresed almost 8 L and is down 5 pounds to 249. He thinks his baseline weight is around 332. Lasix 80 mg TID  daily along with Zaroxolyn 5 mg twice a day -Not on ACE inhibitor due to advanced kidney failure. We'll plan to continue diuresis until closer to dry  weight Dyspnea has improved  Panhypopituitarism -Continue steroids, Synthroid replacement. Also on monthly testosterone replacement.  Diabetes insipidus -On DDAVP.  Seizure disorder -Continue Keppra and Dilantin.  Chronic kidney disease stage IV -Creatinine remains at baseline up to 2.7 today although is gone as high as 3.5 in the past.    DVT prophylaxsis  Lovenox  Code Status:  Full code   Family Communication: Left message for daughter  Disposition Plan:  Continue as inpatient, anticipate discharge next few days, once close to dry weight       Consultants: Pulmonary  Procedures:  None  Antibiotics: Anti-infectives    None       Objective: Vitals:   08/24/17 0450 08/24/17 0546 08/24/17 0800 08/24/17 1234  BP:  (!) 112/46    Pulse:  70    Resp:  18    Temp:  98.4 F (36.9 C)    TempSrc:  Oral    SpO2: 96% 100% 96% 98%  Weight:  113 kg (249 lb 1.6 oz)    Height:        Intake/Output Summary (Last 24 hours) at 08/24/17 1416 Last data filed at 08/24/17 1021  Gross per 24 hour  Intake             1423 ml  Output             3950 ml  Net            -2527 ml    Exam:  Examination:  General exam: Alert and oriented 3, no acute distress HEENT: Normal psych major medical because members  are moist Neck: Thick, narrow airway, no JVD Respiratory system: Clear to auscultation bilaterally, breathing not labored Cardiovascular system: Regular rate and rhythm, S1-S2 Gastrointestinal system: Soft, nontender, nondistended, positive bowel sounds Neuro:No focal neurological deficits. Musculoskeletal: No clubbing or cyanosis, 1+ pitting edema from the knees down bilaterally Skin: No skin breaks, tears or lesions Psychiatry: Patient is appropriate, no evidence of psychoses    Data Reviewed: I have personally reviewed following labs and imaging studies  Micro Results No results found for this or any previous visit (from the past 240  hour(s)).  Radiology Reports Dg Chest 2 View  Result Date: 08/21/2017 CLINICAL DATA:  Shortness of breath, increased swelling in BILATERAL lower extremities and abdomen, history CHF, diabetes mellitus, hypertension, COPD EXAM: CHEST  2 VIEW COMPARISON:  08/04/2017 FINDINGS: Enlargement of cardiac silhouette with pulmonary vascular congestion. Stable mediastinal contours. New RIGHT pleural effusion and RIGHT basilar atelectasis. No definite acute infiltrate, pneumothorax or LEFT pleural effusion. Bones unremarkable. IMPRESSION: Enlargement of cardiac silhouette with pulmonary vascular congestion. New RIGHT pleural effusion and basilar atelectasis. Electronically Signed   By: Lavonia Dana M.D.   On: 08/21/2017 18:00   Dg Chest 2 View  Result Date: 08/04/2017 CLINICAL DATA:  Eck of shortness of breath. History of CHF, aortic insufficiency COPD, hypertension, former smoker. Previous nephrectomy for malignancy. EXAM: CHEST  2 VIEW COMPARISON:  Chest x-ray of June 04, 2017 FINDINGS: The cardiac silhouette is enlarged. The pulmonary vascularity is engorged and less distinct today. The lungs are adequately inflated. There is no alveolar infiltrate or pleural effusion. There is calcification in the wall of the aortic arch. There is multilevel degenerative disc disease of the thoracic spine. IMPRESSION: CHF with mild pulmonary vascular congestion and mild interstitial edema. This has worsened since the previous study. No alveolar pneumonia. Thoracic aortic atherosclerosis. Electronically Signed   By: David  Martinique M.D.   On: 08/04/2017 09:31     CBC  Recent Labs Lab 08/21/17 1544 08/23/17 0529 08/24/17 0702  WBC 7.2 7.8 7.7  HGB 9.5* 9.4* 9.8*  HCT 30.9* 29.6* 31.1*  PLT 166 163 163  MCV 98.4 96.7 96.3  MCH 30.3 30.7 30.3  MCHC 30.7 31.8 31.5  RDW 16.7* 16.4* 16.5*    Chemistries   Recent Labs Lab 08/21/17 1544 08/22/17 0611 08/23/17 0529 08/24/17 0702  NA 138 139 137 135  K 5.0 4.4 3.9  4.5  CL 103 103 100* 96*  CO2 27 27 28 28   GLUCOSE 150* 96 97 97  BUN 66* 70* 73* 78*  CREATININE 2.79* 2.62* 2.51* 2.77*  CALCIUM 8.5* 8.4* 8.4* 8.5*  MG  --   --  2.3  --   AST  --   --  18 14*  ALT  --   --  45 38  ALKPHOS  --   --  52 42  BILITOT  --   --  0.5 0.4   ------------------------------------------------------------------------------------------------------------------ estimated creatinine clearance is 31.7 mL/min (A) (by C-G formula based on SCr of 2.77 mg/dL (H)). ------------------------------------------------------------------------------------------------------------------ No results for input(s): HGBA1C in the last 72 hours. ------------------------------------------------------------------------------------------------------------------ No results for input(s): CHOL, HDL, LDLCALC, TRIG, CHOLHDL, LDLDIRECT in the last 72 hours. ------------------------------------------------------------------------------------------------------------------ No results for input(s): TSH, T4TOTAL, T3FREE, THYROIDAB in the last 72 hours.  Invalid input(s): FREET3 ------------------------------------------------------------------------------------------------------------------ No results for input(s): VITAMINB12, FOLATE, FERRITIN, TIBC, IRON, RETICCTPCT in the last 72 hours.  Coagulation profile No results for input(s): INR, PROTIME in the last 168 hours.  No results for input(s): DDIMER  in the last 72 hours.  Cardiac Enzymes  Recent Labs Lab 08/21/17 1544 08/21/17 1853  TROPONINI 0.03* 0.03*   ------------------------------------------------------------------------------------------------------------------ Invalid input(s): POCBNP   CBG:  Recent Labs Lab 08/22/17 2124 08/23/17 0709 08/23/17 1126 08/23/17 1608 08/23/17 2029  GLUCAP 92 99 113* 127* 98       Studies: No results found.    Lab Results  Component Value Date   HGBA1C 5.8 (H) 01/22/2017    HGBA1C 5.4 10/29/2016   HGBA1C 6.0 (H) 07/20/2016   Lab Results  Component Value Date   LDLCALC 82 03/22/2013   CREATININE 2.77 (H) 08/24/2017       Scheduled Meds: . albuterol  2.5 mg Nebulization QID  . amLODipine  10 mg Oral Daily  . aspirin EC  81 mg Oral Daily  . calcitRIOL  0.5 mcg Oral Daily  . cloNIDine  0.2 mg Oral TID  . colchicine  0.6 mg Oral Daily  . desmopressin  0.2 mg Oral BID  . dexamethasone  1.5 mg Oral Daily  . enoxaparin (LOVENOX) injection  40 mg Subcutaneous Q24H  . furosemide  80 mg Intravenous TID  . isosorbide-hydrALAZINE  1 tablet Oral TID  . levETIRAcetam  250 mg Oral BID  . levothyroxine  150 mcg Oral QAC breakfast  . metolazone  5 mg Oral BID  . phenytoin  100 mg Oral TID  . simvastatin  20 mg Oral q1800  . sodium chloride flush  3 mL Intravenous Q12H  . tamsulosin  0.4 mg Oral QPC supper  . tiotropium  1 capsule Inhalation Daily   Continuous Infusions: . sodium chloride       LOS: 3 days     [facility] Pager [phone]. If 7PM-7AM, please contact night-coverage at [email or web address], password [facility] 08/24/2017, 2:16 PM  LOS: 3 days

## 2017-08-25 DIAGNOSIS — J411 Mucopurulent chronic bronchitis: Secondary | ICD-10-CM

## 2017-08-25 NOTE — Progress Notes (Signed)
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 He came to the emergency department with increasing shortness of breath and lower extremity edema was found to have volume overload chest x-ray showing pulmonary edema . Patient admitted with COPD CHF exacerbation.  Over the past few days, patient's breathing continues to improve. ([name] normally on 2 L nasal cannula when necessary at home and is close to that now area). Patient down to 249 pounds from 254 on admission and diuresed and was 8 L of fluid. Patient states his breathing feels better.  Assessment and plan Acute on chronic respiratory failure with hypoxia due to chronic combined systolic and diastolic heart failure and COPD exacerbation -Known ejection fraction of 45-50% per echo in March 2018. Repeat 2-D echo unchanged -Strict I's and O's, patient diuresed almost 8 L and is down 5 pounds to 249.   Continue Lasix 80 mg TID  , hold Zaroxolyn 5 mg twice a day given slight increase in creatinine -Not on ACE inhibitor due to advanced kidney failure. Most likely has reached his dry weight Anticipate discharge  tomorrow if renal function stable  Panhypopituitarism -Continue steroids, Synthroid replacement. Also on monthly testosterone replacement.  Diabetes insipidus -On DDAVP.  Seizure disorder -Continue Keppra and Dilantin.  Chronic kidney disease stage IV -Baseline about 2.5, in the past it has been as high as 3.5  Anticipate discharge tomorrow if renal function is stable    DVT prophylaxsis  Lovenox  Code Status:  Full code   Family Communication: Left message for daughter  Disposition Plan:  Continue as inpatient, anticipate discharge next few days       Consultants: Pulmonary  Procedures:  None  Antibiotics: Anti-infectives    None       Objective: Vitals:   08/25/17 0412 08/25/17 0600 08/25/17 0939 08/25/17 1124  BP:  118/66    Pulse:  66    Resp:  20    Temp:  98 F (36.7 C)    TempSrc:  Oral    SpO2: 98% 97% 99% 99%  Weight:  113.6 kg (250 lb 8.8 oz)    Height:        Intake/Output Summary (Last 24 hours) at 08/25/17 1132 Last data filed at 08/25/17 1045  Gross per 24 hour  Intake             1100 ml  Output             2300 ml  Net            -1200 ml    Exam:  Examination:  General exam: Alert and oriented 3, no acute distress HEENT: Normal psych major medical because members are  moist Neck: Thick, narrow airway, no JVD Respiratory system: Clear to auscultation bilaterally, breathing not labored Cardiovascular system: Regular rate and rhythm, S1-S2 Gastrointestinal system: Soft, nontender, nondistended, positive bowel sounds Neuro:No focal neurological deficits. Musculoskeletal: No clubbing or cyanosis, 1+ pitting edema from the knees down bilaterally Skin: No skin breaks, tears or lesions Psychiatry: Patient is appropriate, no evidence of psychoses    Data Reviewed: I have personally reviewed following labs and imaging studies  Micro Results No results found for this or any previous visit (from the past 240  hour(s)).  Radiology Reports Dg Chest 2 View  Result Date: 08/21/2017 CLINICAL DATA:  Shortness of breath, increased swelling in BILATERAL lower extremities and abdomen, history CHF, diabetes mellitus, hypertension, COPD EXAM: CHEST  2 VIEW COMPARISON:  08/04/2017 FINDINGS: Enlargement of cardiac silhouette with pulmonary vascular congestion. Stable mediastinal contours. New RIGHT pleural effusion and RIGHT basilar atelectasis. No definite acute infiltrate, pneumothorax or LEFT pleural effusion. Bones unremarkable. IMPRESSION: Enlargement of cardiac silhouette with pulmonary vascular congestion. New RIGHT pleural effusion and basilar atelectasis. Electronically Signed   By: Lavonia Dana M.D.   On: 08/21/2017 18:00   Dg Chest 2 View  Result Date: 08/04/2017 CLINICAL DATA:  Eck of shortness of breath. History of CHF, aortic insufficiency COPD, hypertension, former smoker. Previous nephrectomy for malignancy. EXAM: CHEST  2 VIEW COMPARISON:  Chest x-ray of June 04, 2017 FINDINGS: The cardiac silhouette is enlarged. The pulmonary vascularity is engorged and less distinct today. The lungs are adequately inflated. There is no alveolar infiltrate or pleural effusion. There is calcification in the wall of the aortic arch. There is multilevel degenerative disc disease of the thoracic spine. IMPRESSION: CHF with mild pulmonary vascular congestion and mild interstitial edema. This has worsened since the previous study. No alveolar pneumonia. Thoracic aortic atherosclerosis. Electronically Signed   By: David  Martinique M.D.   On: 08/04/2017 09:31     CBC  Recent Labs Lab 08/21/17 1544 08/23/17 0529 08/24/17 0702  WBC 7.2 7.8 7.7  HGB 9.5* 9.4* 9.8*  HCT 30.9* 29.6* 31.1*  PLT 166 163 163  MCV 98.4 96.7 96.3  MCH 30.3 30.7 30.3  MCHC 30.7 31.8 31.5  RDW 16.7* 16.4* 16.5*    Chemistries   Recent Labs Lab 08/21/17 1544 08/22/17 0611 08/23/17 0529 08/24/17 0702  NA 138 139 137 135  K 5.0 4.4 3.9  4.5  CL 103 103 100* 96*  CO2 27 27 28 28   GLUCOSE 150* 96 97 97  BUN 66* 70* 73* 78*  CREATININE 2.79* 2.62* 2.51* 2.77*  CALCIUM 8.5* 8.4* 8.4* 8.5*  MG  --   --  2.3  --   AST  --   --  18 14*  ALT  --   --  45 38  ALKPHOS  --   --  52 42  BILITOT  --   --  0.5 0.4   ------------------------------------------------------------------------------------------------------------------ estimated creatinine clearance is 31.8 mL/min (A) (by C-G formula based on SCr of 2.77 mg/dL (H)). ------------------------------------------------------------------------------------------------------------------ No results for input(s): HGBA1C in the last 72 hours. ------------------------------------------------------------------------------------------------------------------ No results for input(s): CHOL, HDL, LDLCALC, TRIG, CHOLHDL, LDLDIRECT in the last 72 hours. ------------------------------------------------------------------------------------------------------------------ No results for input(s): TSH, T4TOTAL, T3FREE, THYROIDAB in the last 72 hours.  Invalid input(s): FREET3 ------------------------------------------------------------------------------------------------------------------ No results for input(s): VITAMINB12, FOLATE, FERRITIN, TIBC, IRON, RETICCTPCT in the last 72 hours.  Coagulation profile No results for input(s): INR, PROTIME in the last 168 hours.  No results for input(s): DDIMER in  the last 72 hours.  Cardiac Enzymes  Recent Labs Lab 08/21/17 1544 08/21/17 1853  TROPONINI 0.03* 0.03*   ------------------------------------------------------------------------------------------------------------------ Invalid input(s): POCBNP   CBG:  Recent Labs Lab 08/22/17 2124 08/23/17 0709 08/23/17 1126 08/23/17 1608 08/23/17 2029  GLUCAP 92 99 113* 127* 98       Studies: No results found.    Lab Results  Component Value Date   HGBA1C 5.8 (H) 01/22/2017    HGBA1C 5.4 10/29/2016   HGBA1C 6.0 (H) 07/20/2016   Lab Results  Component Value Date   LDLCALC 82 03/22/2013   CREATININE 2.77 (H) 08/24/2017       Scheduled Meds: . albuterol  2.5 mg Nebulization QID  . amLODipine  10 mg Oral Daily  . aspirin EC  81 mg Oral Daily  . calcitRIOL  0.5 mcg Oral Daily  . cloNIDine  0.2 mg Oral TID  . colchicine  0.6 mg Oral Daily  . desmopressin  0.2 mg Oral BID  . dexamethasone  1.5 mg Oral Daily  . enoxaparin (LOVENOX) injection  40 mg Subcutaneous Q24H  . furosemide  80 mg Intravenous TID  . isosorbide-hydrALAZINE  1 tablet Oral TID  . levETIRAcetam  250 mg Oral BID  . levothyroxine  150 mcg Oral QAC breakfast  . metolazone  5 mg Oral BID  . phenytoin  100 mg Oral TID  . simvastatin  20 mg Oral q1800  . sodium chloride flush  3 mL Intravenous Q12H  . tamsulosin  0.4 mg Oral QPC supper  . tiotropium  1 capsule Inhalation Daily   Continuous Infusions: . sodium chloride       LOS: 4 days     Reyne Dumas  [facility] Pager [phone]. If 7PM-7AM, please contact night-coverage at [email or web address], password [facility] 08/25/2017, 11:32 AM  LOS: 4 days

## 2017-08-25 NOTE — Consult Note (Signed)
   [facility] CM Inpatient Consult   08/25/2017  [name] [name] [birth year] [id number]   Patient screened for [place] Management services Patient screened for potential [place] Management services. Patient is on the [facility] registry as a benefit of their [employer]. Met with the patient regarding the benefits of [place] [facility] services. Explained that [place] [facility] is a covered benefit of patient's [employer]. Reviewed information for [place] and a brochure was provided with contact information.  Explained that [place] [facility] does not interfere with or replace any services arranged by the inpatient care management staff.  Patient declined services with [place] [facility] at this time. Inpatient case manager made aware of patients decision. For questions or concerns please contact:  Rosangela Fehrenbach RN, [facility] Liaison  [phone]) Business Mobile [phone]) Toll free office

## 2017-08-26 DIAGNOSIS — I5043 Acute on chronic combined systolic (congestive) and diastolic (congestive) heart failure: Secondary | ICD-10-CM

## 2017-08-26 DIAGNOSIS — I10 Essential (primary) hypertension: Secondary | ICD-10-CM

## 2017-08-26 DIAGNOSIS — N184 Chronic kidney disease, stage 4 (severe): Secondary | ICD-10-CM

## 2017-08-26 DIAGNOSIS — J9621 Acute and chronic respiratory failure with hypoxia: Secondary | ICD-10-CM

## 2017-08-26 DIAGNOSIS — J42 Unspecified chronic bronchitis: Secondary | ICD-10-CM

## 2017-08-26 LAB — BASIC METABOLIC PANEL
Anion gap: 11 (ref 5–15)
BUN: 94 mg/dL — AB (ref 6–20)
CO2: 30 mmol/L (ref 22–32)
Calcium: 8.5 mg/dL — ABNORMAL LOW (ref 8.9–10.3)
Chloride: 95 mmol/L — ABNORMAL LOW (ref 101–111)
Creatinine, Ser: 2.92 mg/dL — ABNORMAL HIGH (ref 0.61–1.24)
GFR, EST AFRICAN AMERICAN: 23 mL/min — AB (ref >60)
GFR, EST NON AFRICAN AMERICAN: 20 mL/min — AB (ref >60)
Glucose, Bld: 90 mg/dL (ref 65–99)
POTASSIUM: 4.2 mmol/L (ref 3.5–5.1)
SODIUM: 136 mmol/L (ref 135–145)

## 2017-08-26 MED ORDER — TAMSULOSIN HCL 0.4 MG PO CAPS: 0.4000 mg | ORAL_CAPSULE | Freq: Every day | ORAL | 0 refills | Status: AC

## 2017-08-26 MED ORDER — PREDNISONE 20 MG PO TABS: 20.0000 mg | ORAL_TABLET | Freq: Every day | ORAL | 0 refills | Status: DC

## 2017-08-26 NOTE — Care Management Note (Signed)
Case Management Note  Patient Details  Name: KUMAR FALWELL MRN: [id number] Date of Birth: 05/18/1946  Subjective/Objective:                  Admitted with resp failure. Pt is from home, lives alone, has daughter who checks on him, assists with shopping and medications. Pt has all necessary DME pta. She has refused HH and [facility] services. He communicates no needs or concerns about DC home.   Action/Plan: DC home today with self care.   Expected Discharge Date:  08/26/17               Expected Discharge Plan:  Home/Self Care  In-House Referral:  NA  Discharge planning Services  CM Consult  Post Acute Care Choice:  NA Choice offered to:  NA  Status of Service:  Completed, signed off  Sherald Barge, RN 08/26/2017, 9:41 AM

## 2017-08-26 NOTE — Discharge Summary (Signed)
Physician Discharge Summary  [name] [name] HMC:[id number] DOB: [birth year] DOA: 08/21/2017  PCP: [name], MD  Admit date: 08/21/2017 Discharge date: 08/26/2017  Admitted From: Home Disposition:  Home  Recommendations for Outpatient Follow-up:  1. Follow up with PCP in 1-2 weeks   Home Health: None (patient refused home health) Equipment/Devices: Oxygen (2 L via nasal cannula as needed)  Discharge Condition: Fair CODE STATUS: Full code Diet recommendation: Heart Healthy   Discharge weight: 110.3 kg (243 pounds)  Discharge Diagnoses:  Principal Problem:   Acute and chronic respiratory failure with hypoxia ([place])   Active Problems:   Acute on chronic combined systolic and diastolic CHF (congestive heart failure) ([place])   Essential hypertension   Morbid obesity due to excess calories (HCC)   CKD (chronic kidney disease) stage 4, GFR 15-29 ml/min (HCC)   COPD (chronic obstructive pulmonary disease) (HCC)   Panhypopituitarism (HCC)   Seizure disorder (HCC)   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 He was hospitalized 3 weeks back for acute respiratory failure secondary to CHF, was diuresed and sent from. He felt fine for a few days but symptoms worsened and gained about 5 kgs. He was also being treated on oral prednisone taper as outpatient.  Hospital course Acute on chronic hypoxic respiratory failure Acute on chronic combined systolic and diastolic CHF Patient diuresed almost 11 L with IV Lasix and weight down to 243lbs from 255 lbs on admission. On reviewing his last  hospitalization he seems to be at baseline weight. Symptoms much better. Will resume his home dose torsemide. Instructed to monitor his weight daily and fluid intake. Resume statin, hydralazine and Imdur. Patient will likely benefit from adding bisoprolol as outpatient. 2-D echo repeated which again shows EF of 45 and 50% with decreased left ventricular diastolic compliance. Increased PA pressure of 72 mmHg.  Panhypopituitarism Continue low-dose Decadron, Synthroid, and monthly testosterone injection.  Diabetes insipidus Continue DDAVP.  Seizure disorder Continue Keppra and Dilantin.  Chronic kidney disease stage IV Renal function at baseline (creatinine 2.5)  Obstructive sleep apnea Non-compliant with CPAP. Has markedly elevated PA pressure of 72 mmHg on echo.  BPH symptoms Was started on Flomax by my partner which will be prescribed upon discharge.  COPD I think his respiratory symptoms are primarily from CHF and not from COPD. Recently being treated on prednisone taper which he can complete upon discharge. Continue home inhalers and nebulizer.   Family communication: None at bedside Disposition: Home  Procedure: 2-D echo  Discharge Instructions   Allergies as of 08/26/2017   No Known Allergies     Medication List    STOP taking these medications   predniSONE 10 MG (21) Tbpk tablet Commonly known as:  STERAPRED UNI-PAK 21 TAB     TAKE these medications   acetaminophen 325 MG tablet Commonly known as:  TYLENOL Take 650 mg by mouth every 6 (six) hours as needed for mild pain.   albuterol (2.5 MG/3ML) 0.083% nebulizer solution Commonly known as:  PROVENTIL Take 2.5 mg by nebulization 4 (four) times daily.   amLODipine 10 MG tablet Commonly known as:  NORVASC TAKE ONE TABLET BY MOUTH DAILY.  aspirin EC 81 MG tablet Take 81 mg by mouth daily.   calcitRIOL 0.5 MCG capsule Commonly known as:  ROCALTROL Take 0.5 mcg by mouth daily.   cloNIDine 0.2 MG  tablet Commonly known as:  CATAPRES TAKE (1) TABLET BY MOUTH (3) TIMES DAILY   colchicine 0.6 MG tablet Take 1 tablet (0.6 mg total) by mouth daily.   desmopressin 0.2 MG tablet Commonly known as:  DDAVP Take 1 tablet (0.2 mg total) by mouth 2 (two) times daily.   dexamethasone 1.5 MG tablet Commonly known as:  DECADRON Take 1.5 mg by mouth daily.   diphenoxylate-atropine 2.5-0.025 MG tablet Commonly known as:  LOMOTIL Take 1 tablet by mouth every 6 (six) hours as needed for diarrhea or loose stools.   ipratropium-albuterol 0.5-2.5 (3) MG/3ML Soln Commonly known as:  DUONEB Take 3 mLs by nebulization 2 (two) times daily. And additional every four hours as needed   isosorbide-hydrALAZINE 20-37.5 MG tablet Commonly known as:  BIDIL Take 1 tablet by mouth 3 (three) times daily.   levETIRAcetam 250 MG tablet Commonly known as:  KEPPRA Take 1 tablet (250 mg total) by mouth 2 (two) times daily.   levothyroxine 150 MCG tablet Commonly known as:  SYNTHROID, LEVOTHROID Take 150 mcg by mouth daily before breakfast.   loperamide 2 MG capsule Commonly known as:  IMODIUM Take 1 capsule (2 mg total) by mouth 4 (four) times daily as needed for diarrhea or loose stools.   phenytoin 100 MG ER capsule Commonly known as:  DILANTIN Take 100 mg by mouth 3 (three) times daily.   potassium chloride SA 20 MEQ tablet Commonly known as:  K-DUR,KLOR-CON Take 1 tablet (20 mEq total) by mouth daily.   simvastatin 20 MG tablet Commonly known as:  ZOCOR   SPIRIVA HANDIHALER 18 MCG inhalation capsule Generic drug:  tiotropium Place 1 capsule into inhaler and inhale daily.   tamsulosin 0.4 MG Caps capsule Commonly known as:  FLOMAX Take 1 capsule (0.4 mg total) by mouth daily after supper.   testosterone cypionate 200 MG/ML injection Commonly known as:  DEPOTESTOSTERONE CYPIONATE Inject 200 mg into the muscle every 28 (twenty-eight) days. Notes to patient:  Take as scheduled   torsemide  20 MG tablet Commonly known as:  DEMADEX Take 3 tablets (60 mg total) by mouth 2 (two) times daily.            Discharge Care Instructions        Start     Ordered   08/26/17 0000  tamsulosin (FLOMAX) 0.4 MG CAPS capsule  Daily after supper     08/26/17 1322     Follow-up Information    [name], MD Follow up in 1 week(s).   Specialty:  Internal Medicine Why:  Call office for appointment Contact information: [address] [place] [address] [phone]          No Known Allergies    Procedures/Studies: Dg Chest 2 View  Result Date: 08/21/2017 CLINICAL DATA:  Shortness of breath, increased swelling in BILATERAL lower extremities and abdomen, history CHF, diabetes mellitus, hypertension, COPD EXAM: CHEST  2 VIEW COMPARISON:  08/04/2017 FINDINGS: Enlargement of cardiac silhouette with pulmonary vascular congestion. Stable mediastinal contours. New RIGHT pleural effusion and RIGHT basilar atelectasis. No definite acute infiltrate, pneumothorax or LEFT pleural effusion. Bones unremarkable. IMPRESSION: Enlargement of cardiac silhouette with pulmonary vascular congestion. New RIGHT pleural effusion and basilar atelectasis. Electronically Signed   By: Lavonia Dana M.D.   On:  08/21/2017 18:00   Dg Chest 2 View  Result Date: 08/04/2017 CLINICAL DATA:  Eck of shortness of breath. History of CHF, aortic insufficiency COPD, hypertension, former smoker. Previous nephrectomy for malignancy. EXAM: CHEST  2 VIEW COMPARISON:  Chest x-ray of June 04, 2017 FINDINGS: The cardiac silhouette is enlarged. The pulmonary vascularity is engorged and less distinct today. The lungs are adequately inflated. There is no alveolar infiltrate or pleural effusion. There is calcification in the wall of the aortic arch. There is multilevel degenerative disc disease of the thoracic spine. IMPRESSION: CHF with mild pulmonary vascular congestion and mild interstitial edema. This has worsened  since the previous study. No alveolar pneumonia. Thoracic aortic atherosclerosis. Electronically Signed   By: David  Martinique M.D.   On: 08/04/2017 09:31    2-D echo Study Conclusions  - Left ventricle: The cavity size was moderately dilated. Wall   thickness was increased in a pattern of moderate LVH. Systolic   function was mildly reduced. The estimated ejection fraction was   in the range of 45% to 50%. Doppler parameters are consistent   with restrictive physiology, indicative of decreased left   ventricular diastolic compliance and/or increased left atrial   pressure. Doppler parameters are consistent with high ventricular   filling pressure. - Regional wall motion abnormality: Hypokinesis of the basal   inferior and basal inferolateral myocardium; mild hypokinesis of   the mid anterolateral and apical lateral myocardium. Basal   anterior hypokinesis. - Aortic valve: Severely calcified annulus. Trileaflet. There was   severe regurgitation. Regurgitation pressure half-time: 168 ms. - Aorta: Moderate aortic root enlargement. Aortic root dimension:   46 mm (ED). - Mitral valve: Mildly calcified annulus. Normal thickness leaflets   . There was moderate to severe regurgitation. - Left atrium: The atrium was moderately to severely dilated. - Right atrium: The atrium was moderately dilated. - Tricuspid valve: There was moderate-severe regurgitation. - Pulmonary arteries: Systolic pressure was severely increased. PA   peak pressure: 72 mm Hg (S). - Systemic veins: Dilated IVC with normal respiratory variation.   Estimated CVP 8 mmHg.    Subjective: Breathing feels much better today. He feels back to his baseline.  Discharge Exam: Vitals:   08/26/17 0804 08/26/17 1119  BP:    Pulse:    Resp:    Temp:    SpO2: 98% 91%   Vitals:   08/26/17 0505 08/26/17 0753 08/26/17 0804 08/26/17 1119  BP: 122/64     Pulse: 72     Resp: 18     Temp: 98.2 F (36.8 C)     TempSrc: Oral      SpO2: 95% 98% 98% 91%  Weight: 110.3 kg (243 lb 3.2 oz)     Height:        General: Elderly obese male not in distress HEENT: Moist, supple neck, no JVD  chest: Clear breath sounds bilaterally, no added sounds CVS: Normal S1 and S2, no murmurs rub or gallop GI: Soft, nondistended, nontender Musculoskeletal: Warm, trace edema bilaterally     The results of significant diagnostics from this hospitalization (including imaging, microbiology, ancillary and laboratory) are listed below for reference.     Microbiology: No results found for this or any previous visit (from the past 240 hour(s)).   Labs: BNP (last 3 results)  Recent Labs  05/08/17 1235 08/04/17 0940 08/21/17 1544  BNP 207.0* 1,054.0* 1,610.9*   Basic Metabolic Panel:  Recent Labs Lab 08/21/17 1544 08/22/17 6045 08/23/17 0529 08/24/17 0702 08/26/17  0600  NA 138 139 137 135 136  K 5.0 4.4 3.9 4.5 4.2  CL 103 103 100* 96* 95*  CO2 27 27 28 28 30   GLUCOSE 150* 96 97 97 90  BUN 66* 70* 73* 78* 94*  CREATININE 2.79* 2.62* 2.51* 2.77* 2.92*  CALCIUM 8.5* 8.4* 8.4* 8.5* 8.5*  MG  --   --  2.3  --   --    Liver Function Tests:  Recent Labs Lab 08/23/17 0529 08/24/17 0702  AST 18 14*  ALT 45 38  ALKPHOS 52 42  BILITOT 0.5 0.4  PROT 6.5 6.7  ALBUMIN 3.5 3.6   No results for input(s): LIPASE, AMYLASE in the last 168 hours. No results for input(s): AMMONIA in the last 168 hours. CBC:  Recent Labs Lab 08/21/17 1544 08/23/17 0529 08/24/17 0702  WBC 7.2 7.8 7.7  HGB 9.5* 9.4* 9.8*  HCT 30.9* 29.6* 31.1*  MCV 98.4 96.7 96.3  PLT 166 163 163   Cardiac Enzymes:  Recent Labs Lab 08/21/17 1544 08/21/17 1853  TROPONINI 0.03* 0.03*   BNP: Invalid input(s): POCBNP CBG:  Recent Labs Lab 08/22/17 2124 08/23/17 0709 08/23/17 1126 08/23/17 1608 08/23/17 2029  GLUCAP 92 99 113* 127* 98   D-Dimer No results for input(s): DDIMER in the last 72 hours. Hgb A1c No results for input(s):  HGBA1C in the last 72 hours. Lipid Profile No results for input(s): CHOL, HDL, LDLCALC, TRIG, CHOLHDL, LDLDIRECT in the last 72 hours. Thyroid function studies No results for input(s): TSH, T4TOTAL, T3FREE, THYROIDAB in the last 72 hours.  Invalid input(s): FREET3 Anemia work up No results for input(s): VITAMINB12, FOLATE, FERRITIN, TIBC, IRON, RETICCTPCT in the last 72 hours. Urinalysis    Component Value Date/Time   COLORURINE STRAW (A) 08/21/2017 1717   APPEARANCEUR CLEAR 08/21/2017 1717   LABSPEC 1.006 08/21/2017 1717   PHURINE 6.0 08/21/2017 1717   GLUCOSEU NEGATIVE 08/21/2017 1717   HGBUR NEGATIVE 08/21/2017 1717   BILIRUBINUR NEGATIVE 08/21/2017 1717   KETONESUR NEGATIVE 08/21/2017 1717   PROTEINUR NEGATIVE 08/21/2017 1717   UROBILINOGEN 0.2 06/25/2015 1353   NITRITE NEGATIVE 08/21/2017 1717   LEUKOCYTESUR NEGATIVE 08/21/2017 1717   Sepsis Labs Invalid input(s): PROCALCITONIN,  WBC,  LACTICIDVEN Microbiology No results found for this or any previous visit (from the past 240 hour(s)).   Time coordinating discharge: Over 30 minutes  SIGNED:   Louellen Molder, MD  [facility] 08/26/2017, 1:30 PM Pager   If 7PM-7AM, please contact night-coverage [email or web address] Password [email or web address]

## 2017-08-26 NOTE — Care Management Important Message (Signed)
Important Message  Patient Details  Name: [name] [name] MRN: [id number] Date of Birth: [birth year]   Medicare Important Message Given:  Yes    Sherald Barge, RN 08/26/2017, 9:41 AM

## 2017-08-26 NOTE — Discharge Instructions (Signed)
Heart Failure °Heart failure is a condition in which the heart has trouble pumping blood because it has become weak or stiff. This means that the heart does not pump blood efficiently for the body to work well. For some people with heart failure, fluid may back up into the lungs and there may be swelling (edema) in the lower legs. Heart failure is usually a long-term (chronic) condition. It is important for you to take good care of yourself and follow the treatment plan from your health care provider. °What are the causes? °This condition is caused by some health problems, including: °· High blood pressure (hypertension). Hypertension causes the heart muscle to work harder than normal. High blood pressure eventually causes the heart to become stiff and weak. °· Coronary artery disease (CAD). CAD is the buildup of cholesterol and fat (plaques) in the arteries of the heart. °· Heart attack (myocardial infarction). Injured tissue, which is caused by the heart attack, does not contract as well and the heart's ability to pump blood is weakened. °· Abnormal heart valves. When the heart valves do not open and close properly, the heart muscle must pump harder to keep the blood flowing. °· Heart muscle disease (cardiomyopathy or myocarditis). Heart muscle disease is damage to the heart muscle from a variety of causes, such as drug or alcohol abuse, infections, or unknown causes. These can increase the risk of heart failure. °· Lung disease. When the lungs do not work properly, the heart must work harder. ° °What increases the risk? °Risk of heart failure increases as a person ages. This condition is also more likely to develop in people who: °· Are overweight. °· Are male. °· Smoke or chew tobacco. °· Abuse alcohol or illegal drugs. °· Have taken medicines that can damage the heart, such as chemotherapy drugs. °· Have diabetes. °? High blood sugar (glucose) is associated with high fat (lipid) levels in the blood. °? Diabetes  can also damage tiny blood vessels that carry nutrients to the heart muscle. °· Have abnormal heart rhythms. °· Have thyroid problems. °· Have low blood counts (anemia). ° °What are the signs or symptoms? °Symptoms of this condition include: °· Shortness of breath with activity, such as when climbing stairs. °· Persistent cough. °· Swelling of the feet, ankles, legs, or abdomen. °· Unexplained weight gain. °· Difficulty breathing when lying flat (orthopnea). °· Waking from sleep because of the need to sit up and get more air. °· Rapid heartbeat. °· Fatigue and loss of energy. °· Feeling light-headed, dizzy, or close to fainting. °· Loss of appetite. °· Nausea. °· Increased urination during the night (nocturia). °· Confusion. ° °How is this diagnosed? °This condition is diagnosed based on: °· Medical history, symptoms, and a physical exam. °· Diagnostic tests, which may include: °? Echocardiogram. °? Electrocardiogram (ECG). °? Chest X-ray. °? Blood tests. °? Exercise stress test. °? Radionuclide scans. °? Cardiac catheterization and angiogram. ° °How is this treated? °Treatment for this condition is aimed at managing the symptoms of heart failure. Medicines, behavioral changes, or other treatments may be necessary to treat heart failure. °Medicines °These may include: °· Angiotensin-converting enzyme (ACE) inhibitors. This type of medicine blocks the effects of a blood protein called angiotensin-converting enzyme. ACE inhibitors relax (dilate) the blood vessels and help to lower blood pressure. °· Angiotensin receptor blockers (ARBs). This type of medicine blocks the actions of a blood protein called angiotensin. ARBs dilate the blood vessels and help to lower blood pressure. °· Water   pills (diuretics). Diuretics cause the kidneys to remove salt and water from the blood. The extra fluid is removed through urination, leaving a lower volume of blood that the heart has to pump. °· Beta blockers. These improve heart  muscle strength and they prevent the heart from beating too quickly. °· Digoxin. This increases the force of the heartbeat. ° °Healthy behavior changes °These may include: °· Reaching and maintaining a healthy weight. °· Stopping smoking or chewing tobacco. °· Eating heart-healthy foods. °· Limiting or avoiding alcohol. °· Stopping use of street drugs (illegal drugs). °· Physical activity. ° °Other treatments °These may include: °· Surgery to open blocked coronary arteries or repair damaged heart valves. °· Placement of a biventricular pacemaker to improve heart muscle function (cardiac resynchronization therapy). This device paces both the right ventricle and left ventricle. °· Placement of a device to treat serious abnormal heart rhythms (implantable cardioverter defibrillator, or ICD). °· Placement of a device to improve the pumping ability of the heart (left ventricular assist device, or LVAD). °· Heart transplant. This can cure heart failure, and it is considered for certain patients who do not improve with other therapies. ° °Follow these instructions at home: °Medicines °· Take over-the-counter and prescription medicines only as told by your health care provider. Medicines are important in reducing the workload of your heart, slowing the progression of heart failure, and improving your symptoms. °? Do not stop taking your medicine unless your health care provider told you to do that. °? Do not skip any dose of medicine. °? Refill your prescriptions before you run out of medicine. You need your medicines every day. °Eating and drinking ° °· Eat heart-healthy foods. Talk with a dietitian to make an eating plan that is right for you. °? Choose foods that contain no trans fat and are low in saturated fat and cholesterol. Healthy choices include fresh or frozen fruits and vegetables, fish, lean meats, legumes, fat-free or low-fat dairy products, and whole-grain or high-fiber foods. °? Limit salt (sodium) if  directed by your health care provider. Sodium restriction may reduce symptoms of heart failure. Ask a dietitian to recommend heart-healthy seasonings. °? Use healthy cooking methods instead of frying. Healthy methods include roasting, grilling, broiling, baking, poaching, steaming, and stir-frying. °· Limit your fluid intake if directed by your health care provider. Fluid restriction may reduce symptoms of heart failure. °Lifestyle °· Stop smoking or using chewing tobacco. Nicotine and tobacco can damage your heart and your blood vessels. Do not use nicotine gum or patches before talking to your health care provider. °· Limit alcohol intake to no more than 1 drink per day for non-pregnant women and 2 drinks per day for men. One drink equals 12 oz of beer, 5 oz of wine, or 1½ oz of hard liquor. °? Drinking more than that is harmful to your heart. Tell your health care provider if you drink alcohol several times a week. °? Talk with your health care provider about whether any level of alcohol use is safe for you. °? If your heart has already been damaged by alcohol or you have severe heart failure, drinking alcohol should be stopped completely. °· Stop use of illegal drugs. °· Lose weight if directed by your health care provider. Weight loss may reduce symptoms of heart failure. °· Do moderate physical activity if directed by your health care provider. People who are elderly and people with severe heart failure should consult with a health care provider for physical activity recommendations. °  Monitor important information °· Weigh yourself every day. Keeping track of your weight daily helps you to notice excess fluid sooner. °? Weigh yourself every morning after you urinate and before you eat breakfast. °? Wear the same amount of clothing each time you weigh yourself. °? Record your daily weight. Provide your health care provider with your weight record. °· Monitor and record your blood pressure as told by your health  care provider. °· Check your pulse as told by your health care provider. °Dealing with extreme temperatures °· If the weather is extremely hot: °? Avoid vigorous physical activity. °? Use air conditioning or fans or seek a cooler location. °? Avoid caffeine and alcohol. °? Wear loose-fitting, lightweight, and light-colored clothing. °· If the weather is extremely cold: °? Avoid vigorous physical activity. °? Layer your clothes. °? Wear mittens or gloves, a hat, and a scarf when you go outside. °? Avoid alcohol. °General instructions °· Manage other health conditions such as hypertension, diabetes, thyroid disease, or abnormal heart rhythms as told by your health care provider. °· Learn to manage stress. If you need help to do this, ask your health care provider. °· Plan rest periods when fatigued. °· Get ongoing education and support as needed. °· Participate in or seek rehabilitation as needed to maintain or improve independence and quality of life. °· Stay up to date with immunizations. Keeping current on pneumococcal and influenza immunizations is especially important to prevent respiratory infections. °· Keep all follow-up visits as told by your health care provider. This is important. °Contact a health care provider if: °· You have a rapid weight gain. °· You have increasing shortness of breath that is unusual for you. °· You are unable to participate in your usual physical activities. °· You tire easily. °· You cough more than normal, especially with physical activity. °· You have any swelling or more swelling in areas such as your hands, feet, ankles, or abdomen. °· You are unable to sleep because it is hard to breathe. °· You feel like your heart is beating quickly (palpitations). °· You become dizzy or light-headed when you stand up. °Get help right away if: °· You have difficulty breathing. °· You notice or your family notices a change in your awareness, such as having trouble staying awake or having  difficulty with concentration. °· You have pain or discomfort in your chest. °· You have an episode of fainting (syncope). °This information is not intended to replace advice given to you by your health care provider. Make sure you discuss any questions you have with your health care provider. °Document Released: 11/24/2005 Document Revised: 07/29/2016 Document Reviewed: 06/18/2016 °Elsevier Interactive Patient Education © 2017 [employer] ° °

## 2017-08-26 NOTE — Progress Notes (Signed)
Reviewed D/C paperwork with patient. Brought patient to daughter's car via wheelchair.

## 2017-09-05 DIAGNOSIS — R0902 Hypoxemia: Secondary | ICD-10-CM | POA: Diagnosis not present

## 2017-09-05 DIAGNOSIS — J449 Chronic obstructive pulmonary disease, unspecified: Secondary | ICD-10-CM | POA: Diagnosis not present

## 2017-09-05 DIAGNOSIS — I509 Heart failure, unspecified: Secondary | ICD-10-CM | POA: Diagnosis not present

## 2017-09-10 ENCOUNTER — Encounter (HOSPITAL_COMMUNITY): Payer: Self-pay | Admitting: *Deleted

## 2017-09-10 ENCOUNTER — Emergency Department (HOSPITAL_COMMUNITY): Payer: Medicare Other

## 2017-09-10 ENCOUNTER — Inpatient Hospital Stay (HOSPITAL_COMMUNITY)
Admission: EM | Admit: 2017-09-10 | Discharge: 2017-09-12 | DRG: 190 | Disposition: A | Discharge status: Home / Self Care | Payer: Medicare Other | Attending: Family Medicine | Admitting: Family Medicine

## 2017-09-10 ENCOUNTER — Other Ambulatory Visit: Payer: Self-pay

## 2017-09-10 DIAGNOSIS — E1122 Type 2 diabetes mellitus with diabetic chronic kidney disease: Secondary | ICD-10-CM | POA: Diagnosis not present

## 2017-09-10 DIAGNOSIS — Z7982 Long term (current) use of aspirin: Secondary | ICD-10-CM

## 2017-09-10 DIAGNOSIS — E669 Obesity, unspecified: Secondary | ICD-10-CM | POA: Diagnosis not present

## 2017-09-10 DIAGNOSIS — Z85528 Personal history of other malignant neoplasm of kidney: Secondary | ICD-10-CM

## 2017-09-10 DIAGNOSIS — Z79899 Other long term (current) drug therapy: Secondary | ICD-10-CM

## 2017-09-10 DIAGNOSIS — J9621 Acute and chronic respiratory failure with hypoxia: Secondary | ICD-10-CM | POA: Diagnosis present

## 2017-09-10 DIAGNOSIS — E230 Hypopituitarism: Secondary | ICD-10-CM | POA: Diagnosis present

## 2017-09-10 DIAGNOSIS — Z905 Acquired absence of kidney: Secondary | ICD-10-CM

## 2017-09-10 DIAGNOSIS — I130 Hypertensive heart and chronic kidney disease with heart failure and stage 1 through stage 4 chronic kidney disease, or unspecified chronic kidney disease: Secondary | ICD-10-CM | POA: Diagnosis present

## 2017-09-10 DIAGNOSIS — G40909 Epilepsy, unspecified, not intractable, without status epilepticus: Secondary | ICD-10-CM | POA: Diagnosis not present

## 2017-09-10 DIAGNOSIS — I452 Bifascicular block: Secondary | ICD-10-CM | POA: Diagnosis present

## 2017-09-10 DIAGNOSIS — Z87891 Personal history of nicotine dependence: Secondary | ICD-10-CM

## 2017-09-10 DIAGNOSIS — I440 Atrioventricular block, first degree: Secondary | ICD-10-CM | POA: Diagnosis present

## 2017-09-10 DIAGNOSIS — E785 Hyperlipidemia, unspecified: Secondary | ICD-10-CM | POA: Diagnosis not present

## 2017-09-10 DIAGNOSIS — R079 Chest pain, unspecified: Secondary | ICD-10-CM | POA: Diagnosis not present

## 2017-09-10 DIAGNOSIS — G4730 Sleep apnea, unspecified: Secondary | ICD-10-CM | POA: Diagnosis present

## 2017-09-10 DIAGNOSIS — I129 Hypertensive chronic kidney disease with stage 1 through stage 4 chronic kidney disease, or unspecified chronic kidney disease: Secondary | ICD-10-CM | POA: Diagnosis not present

## 2017-09-10 DIAGNOSIS — D649 Anemia, unspecified: Secondary | ICD-10-CM | POA: Diagnosis present

## 2017-09-10 DIAGNOSIS — I5042 Chronic combined systolic (congestive) and diastolic (congestive) heart failure: Secondary | ICD-10-CM | POA: Diagnosis not present

## 2017-09-10 DIAGNOSIS — Z9981 Dependence on supplemental oxygen: Secondary | ICD-10-CM

## 2017-09-10 DIAGNOSIS — E038 Other specified hypothyroidism: Secondary | ICD-10-CM | POA: Diagnosis not present

## 2017-09-10 DIAGNOSIS — J441 Chronic obstructive pulmonary disease with (acute) exacerbation: Principal | ICD-10-CM | POA: Diagnosis present

## 2017-09-10 DIAGNOSIS — Z6831 Body mass index (BMI) 31.0-31.9, adult: Secondary | ICD-10-CM

## 2017-09-10 DIAGNOSIS — X088XXA Exposure to other specified smoke, fire and flames, initial encounter: Secondary | ICD-10-CM

## 2017-09-10 DIAGNOSIS — N184 Chronic kidney disease, stage 4 (severe): Secondary | ICD-10-CM | POA: Diagnosis present

## 2017-09-10 DIAGNOSIS — Z7952 Long term (current) use of systemic steroids: Secondary | ICD-10-CM

## 2017-09-10 DIAGNOSIS — I351 Nonrheumatic aortic (valve) insufficiency: Secondary | ICD-10-CM | POA: Diagnosis not present

## 2017-09-10 DIAGNOSIS — I10 Essential (primary) hypertension: Secondary | ICD-10-CM | POA: Diagnosis present

## 2017-09-10 DIAGNOSIS — I272 Pulmonary hypertension, unspecified: Secondary | ICD-10-CM | POA: Diagnosis present

## 2017-09-10 DIAGNOSIS — I5022 Chronic systolic (congestive) heart failure: Secondary | ICD-10-CM | POA: Diagnosis present

## 2017-09-10 DIAGNOSIS — Z7729 Contact with and (suspected ) exposure to other hazardous substances: Secondary | ICD-10-CM | POA: Diagnosis not present

## 2017-09-10 DIAGNOSIS — E119 Type 2 diabetes mellitus without complications: Secondary | ICD-10-CM

## 2017-09-10 LAB — BASIC METABOLIC PANEL
Anion gap: 11 (ref 5–15)
BUN: 73 mg/dL — ABNORMAL HIGH (ref 6–20)
CHLORIDE: 106 mmol/L (ref 101–111)
CO2: 22 mmol/L (ref 22–32)
Calcium: 8.7 mg/dL — ABNORMAL LOW (ref 8.9–10.3)
Creatinine, Ser: 2.5 mg/dL — ABNORMAL HIGH (ref 0.61–1.24)
GFR calc Af Amer: 28 mL/min — ABNORMAL LOW (ref >60)
GFR calc non Af Amer: 24 mL/min — ABNORMAL LOW (ref >60)
Glucose, Bld: 99 mg/dL (ref 65–99)
Potassium: 4.2 mmol/L (ref 3.5–5.1)
SODIUM: 139 mmol/L (ref 135–145)

## 2017-09-10 LAB — CBC
HEMATOCRIT: 35.9 % — AB (ref 39.0–52.0)
Hemoglobin: 11.2 g/dL — ABNORMAL LOW (ref 13.0–17.0)
MCH: 29.7 pg (ref 26.0–34.0)
MCHC: 31.2 g/dL (ref 30.0–36.0)
MCV: 95.2 fL (ref 78.0–100.0)
Platelets: 170 10*3/uL (ref 150–400)
RBC: 3.77 MIL/uL — AB (ref 4.22–5.81)
RDW: 16 % — ABNORMAL HIGH (ref 11.5–15.5)
WBC: 8.3 10*3/uL (ref 4.0–10.5)

## 2017-09-10 LAB — TROPONIN I: Troponin I: 0.04 ng/mL (ref <0.03)

## 2017-09-10 LAB — BRAIN NATRIURETIC PEPTIDE: B Natriuretic Peptide: 1127 pg/mL — ABNORMAL HIGH (ref 0.0–100.0)

## 2017-09-10 MED ORDER — ALBUTEROL SULFATE (2.5 MG/3ML) 0.083% IN NEBU
2.5000 mg | INHALATION_SOLUTION | Freq: Once | RESPIRATORY_TRACT | Status: AC
  Administered 2017-09-11: 2.5 mg via RESPIRATORY_TRACT
  Filled 2017-09-10: qty 3

## 2017-09-10 MED ORDER — METHYLPREDNISOLONE SODIUM SUCC 125 MG IJ SOLR
125.0000 mg | Freq: Once | INTRAMUSCULAR | Status: AC
  Administered 2017-09-10: 125 mg via INTRAVENOUS
  Filled 2017-09-10: qty 2

## 2017-09-10 MED ORDER — ALBUTEROL (5 MG/ML) CONTINUOUS INHALATION SOLN
10.0000 mg/h | INHALATION_SOLUTION | Freq: Once | RESPIRATORY_TRACT | Status: AC
  Administered 2017-09-10: 10 mg/h via RESPIRATORY_TRACT
  Filled 2017-09-10: qty 20
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
## 2017-09-10 NOTE — ED Triage Notes (Signed)
Pts daughter states he has had bilateral leg swelling as well.
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
## 2017-09-10 NOTE — ED Notes (Signed)
Date and time results received: 09/10/17 2207 (use smartphrase ".now" to insert current time)  Test: Troponin Critical Value: 0.04  Name of Provider Notified: [place]  Orders Received? Or Actions Taken?:

## 2017-09-10 NOTE — ED Notes (Signed)
Received report on pt,. Pt resting semi fowlers in bed, CAT in progress, pt states that he is breathing better,

## 2017-09-11 ENCOUNTER — Encounter (HOSPITAL_COMMUNITY): Payer: Self-pay | Admitting: Family Medicine

## 2017-09-11 DIAGNOSIS — J9621 Acute and chronic respiratory failure with hypoxia: Secondary | ICD-10-CM | POA: Diagnosis not present

## 2017-09-11 DIAGNOSIS — E1122 Type 2 diabetes mellitus with diabetic chronic kidney disease: Secondary | ICD-10-CM | POA: Diagnosis not present

## 2017-09-11 DIAGNOSIS — I130 Hypertensive heart and chronic kidney disease with heart failure and stage 1 through stage 4 chronic kidney disease, or unspecified chronic kidney disease: Secondary | ICD-10-CM | POA: Diagnosis not present

## 2017-09-11 DIAGNOSIS — G40909 Epilepsy, unspecified, not intractable, without status epilepticus: Secondary | ICD-10-CM | POA: Diagnosis not present

## 2017-09-11 DIAGNOSIS — E785 Hyperlipidemia, unspecified: Secondary | ICD-10-CM | POA: Diagnosis not present

## 2017-09-11 DIAGNOSIS — I10 Essential (primary) hypertension: Secondary | ICD-10-CM

## 2017-09-11 DIAGNOSIS — I440 Atrioventricular block, first degree: Secondary | ICD-10-CM | POA: Diagnosis not present

## 2017-09-11 DIAGNOSIS — I5022 Chronic systolic (congestive) heart failure: Secondary | ICD-10-CM | POA: Diagnosis not present

## 2017-09-11 DIAGNOSIS — E669 Obesity, unspecified: Secondary | ICD-10-CM | POA: Diagnosis not present

## 2017-09-11 DIAGNOSIS — Z87891 Personal history of nicotine dependence: Secondary | ICD-10-CM | POA: Diagnosis not present

## 2017-09-11 DIAGNOSIS — E038 Other specified hypothyroidism: Secondary | ICD-10-CM | POA: Diagnosis not present

## 2017-09-11 DIAGNOSIS — E230 Hypopituitarism: Secondary | ICD-10-CM | POA: Diagnosis not present

## 2017-09-11 DIAGNOSIS — D649 Anemia, unspecified: Secondary | ICD-10-CM | POA: Diagnosis not present

## 2017-09-11 DIAGNOSIS — J441 Chronic obstructive pulmonary disease with (acute) exacerbation: Principal | ICD-10-CM

## 2017-09-11 DIAGNOSIS — I452 Bifascicular block: Secondary | ICD-10-CM | POA: Diagnosis not present

## 2017-09-11 DIAGNOSIS — I351 Nonrheumatic aortic (valve) insufficiency: Secondary | ICD-10-CM | POA: Diagnosis not present

## 2017-09-11 DIAGNOSIS — I272 Pulmonary hypertension, unspecified: Secondary | ICD-10-CM | POA: Diagnosis not present

## 2017-09-11 DIAGNOSIS — N184 Chronic kidney disease, stage 4 (severe): Secondary | ICD-10-CM | POA: Diagnosis not present

## 2017-09-11 DIAGNOSIS — I5042 Chronic combined systolic (congestive) and diastolic (congestive) heart failure: Secondary | ICD-10-CM | POA: Diagnosis not present

## 2017-09-11 DIAGNOSIS — G4730 Sleep apnea, unspecified: Secondary | ICD-10-CM | POA: Diagnosis not present

## 2017-09-11 LAB — BASIC METABOLIC PANEL
ANION GAP: 10 (ref 5–15)
BUN: 73 mg/dL — ABNORMAL HIGH (ref 6–20)
CHLORIDE: 107 mmol/L (ref 101–111)
CO2: 24 mmol/L (ref 22–32)
CREATININE: 2.41 mg/dL — AB (ref 0.61–1.24)
Calcium: 8.7 mg/dL — ABNORMAL LOW (ref 8.9–10.3)
GFR calc non Af Amer: 25 mL/min — ABNORMAL LOW (ref >60)
GFR, EST AFRICAN AMERICAN: 29 mL/min — AB (ref >60)
GLUCOSE: 128 mg/dL — AB (ref 65–99)
Potassium: 4.4 mmol/L (ref 3.5–5.1)
Sodium: 141 mmol/L (ref 135–145)

## 2017-09-11 LAB — CBC
HEMATOCRIT: 33 % — AB (ref 39.0–52.0)
HEMOGLOBIN: 10.4 g/dL — AB (ref 13.0–17.0)
MCH: 30.1 pg (ref 26.0–34.0)
MCHC: 31.5 g/dL (ref 30.0–36.0)
MCV: 95.4 fL (ref 78.0–100.0)
Platelets: 164 10*3/uL (ref 150–400)
RBC: 3.46 MIL/uL — ABNORMAL LOW (ref 4.22–5.81)
RDW: 16 % — AB (ref 11.5–15.5)
WBC: 7.3 10*3/uL (ref 4.0–10.5)

## 2017-09-11 LAB — TROPONIN I: Troponin I: 0.03 ng/mL (ref <0.03)

## 2017-09-11 LAB — GLUCOSE, CAPILLARY: GLUCOSE-CAPILLARY: 109 mg/dL — AB (ref 65–99)

## 2017-09-11 MED ORDER — IPRATROPIUM-ALBUTEROL 0.5-2.5 (3) MG/3ML IN SOLN
3.0000 mL | Freq: Three times a day (TID) | RESPIRATORY_TRACT | Status: DC
  Administered 2017-09-11 – 2017-09-12 (×4): 3 mL via RESPIRATORY_TRACT
  Filled 2017-09-11 (×4): qty 3

## 2017-09-11 MED ORDER — ONDANSETRON HCL 4 MG PO TABS: 4.0000 mg | ORAL_TABLET | Freq: Four times a day (QID) | ORAL | Status: DC | PRN

## 2017-09-11 MED ORDER — SENNOSIDES-DOCUSATE SODIUM 8.6-50 MG PO TABS: 1.0000 | ORAL_TABLET | Freq: Every evening | ORAL | Status: DC | PRN

## 2017-09-11 MED ORDER — CALCITRIOL 0.5 MCG PO CAPS
0.5000 ug | ORAL_CAPSULE | Freq: Every day | ORAL | Status: DC
  Administered 2017-09-11 – 2017-09-12 (×2): 0.5 ug via ORAL
  Filled 2017-09-11 (×4): qty 1

## 2017-09-11 MED ORDER — METHYLPREDNISOLONE SODIUM SUCC 125 MG IJ SOLR
60.0000 mg | Freq: Three times a day (TID) | INTRAMUSCULAR | Status: DC
  Administered 2017-09-11 – 2017-09-12 (×4): 60 mg via INTRAVENOUS
  Filled 2017-09-11 (×4): qty 2

## 2017-09-11 MED ORDER — LEVOTHYROXINE SODIUM 50 MCG PO TABS
150.0000 ug | ORAL_TABLET | Freq: Every day | ORAL | Status: DC
  Administered 2017-09-11 – 2017-09-12 (×2): 150 ug via ORAL
  Filled 2017-09-11 (×2): qty 3

## 2017-09-11 MED ORDER — TIOTROPIUM BROMIDE MONOHYDRATE 18 MCG IN CAPS: 1.0000 | ORAL_CAPSULE | Freq: Every day | RESPIRATORY_TRACT | Status: DC

## 2017-09-11 MED ORDER — ASPIRIN EC 81 MG PO TBEC
81.0000 mg | DELAYED_RELEASE_TABLET | Freq: Every day | ORAL | Status: DC
  Administered 2017-09-11 – 2017-09-12 (×2): 81 mg via ORAL
  Filled 2017-09-11 (×2): qty 1

## 2017-09-11 MED ORDER — SODIUM CHLORIDE 0.9% FLUSH
3.0000 mL | Freq: Two times a day (BID) | INTRAVENOUS | Status: DC
  Administered 2017-09-11 – 2017-09-12 (×3): 3 mL via INTRAVENOUS

## 2017-09-11 MED ORDER — ONDANSETRON HCL 4 MG/2ML IJ SOLN: 4.0000 mg | Freq: Four times a day (QID) | INTRAMUSCULAR | Status: DC | PRN

## 2017-09-11 MED ORDER — TAMSULOSIN HCL 0.4 MG PO CAPS
0.4000 mg | ORAL_CAPSULE | Freq: Every day | ORAL | Status: DC
  Administered 2017-09-11: 0.4 mg via ORAL
  Filled 2017-09-11: qty 1

## 2017-09-11 MED ORDER — AMLODIPINE BESYLATE 5 MG PO TABS
10.0000 mg | ORAL_TABLET | Freq: Every day | ORAL | Status: DC
Start: 2017-09-11 — End: 2017-09-12
  Administered 2017-09-11 – 2017-09-12 (×2): 10 mg via ORAL
  Filled 2017-09-11 (×2): qty 2

## 2017-09-11 MED ORDER — ACETAMINOPHEN 325 MG PO TABS: 650.0000 mg | ORAL_TABLET | Freq: Four times a day (QID) | ORAL | Status: DC | PRN

## 2017-09-11 MED ORDER — ACETAMINOPHEN 650 MG RE SUPP: 650.0000 mg | Freq: Four times a day (QID) | RECTAL | Status: DC | PRN

## 2017-09-11 MED ORDER — DESMOPRESSIN ACETATE 0.2 MG PO TABS
0.2000 mg | ORAL_TABLET | Freq: Two times a day (BID) | ORAL | Status: DC
  Administered 2017-09-11 – 2017-09-12 (×3): 0.2 mg via ORAL
  Filled 2017-09-11 (×7): qty 1

## 2017-09-11 MED ORDER — DIPHENOXYLATE-ATROPINE 2.5-0.025 MG PO TABS: 1.0000 | ORAL_TABLET | Freq: Four times a day (QID) | ORAL | Status: DC | PRN

## 2017-09-11 MED ORDER — ALBUTEROL SULFATE (2.5 MG/3ML) 0.083% IN NEBU: 2.5000 mg | INHALATION_SOLUTION | RESPIRATORY_TRACT | Status: DC | PRN

## 2017-09-11 MED ORDER — CLONIDINE HCL 0.2 MG PO TABS
0.2000 mg | ORAL_TABLET | Freq: Three times a day (TID) | ORAL | Status: DC
  Administered 2017-09-11 – 2017-09-12 (×4): 0.2 mg via ORAL
  Filled 2017-09-11 (×4): qty 1

## 2017-09-11 MED ORDER — SODIUM CHLORIDE 0.9% FLUSH
3.0000 mL | INTRAVENOUS | Status: DC | PRN
  Administered 2017-09-11: 3 mL via INTRAVENOUS
  Filled 2017-09-11: qty 3

## 2017-09-11 MED ORDER — SODIUM CHLORIDE 0.9 % IV SOLN: 250.0000 mL | INTRAVENOUS | Status: DC | PRN

## 2017-09-11 MED ORDER — TORSEMIDE 20 MG PO TABS
60.0000 mg | ORAL_TABLET | Freq: Two times a day (BID) | ORAL | Status: DC
  Administered 2017-09-11 – 2017-09-12 (×3): 60 mg via ORAL
  Filled 2017-09-11 (×3): qty 3

## 2017-09-11 MED ORDER — ISOSORB DINITRATE-HYDRALAZINE 20-37.5 MG PO TABS
1.0000 | ORAL_TABLET | Freq: Three times a day (TID) | ORAL | Status: DC
  Administered 2017-09-11 – 2017-09-12 (×3): 1 via ORAL
  Filled 2017-09-11 (×10): qty 1

## 2017-09-11 MED ORDER — PHENYTOIN SODIUM EXTENDED 100 MG PO CAPS
100.0000 mg | ORAL_CAPSULE | Freq: Three times a day (TID) | ORAL | Status: DC
  Administered 2017-09-11 – 2017-09-12 (×4): 100 mg via ORAL
  Filled 2017-09-11 (×4): qty 1

## 2017-09-11 MED ORDER — LEVETIRACETAM 250 MG PO TABS
250.0000 mg | ORAL_TABLET | Freq: Two times a day (BID) | ORAL | Status: DC
  Administered 2017-09-11 – 2017-09-12 (×3): 250 mg via ORAL
  Filled 2017-09-11 (×3): qty 1

## 2017-09-11 MED ORDER — SIMVASTATIN 10 MG PO TABS
20.0000 mg | ORAL_TABLET | Freq: Every day | ORAL | Status: DC
  Administered 2017-09-11: 20 mg via ORAL
  Filled 2017-09-11: qty 2

## 2017-09-11 MED ORDER — HEPARIN SODIUM (PORCINE) 5000 UNIT/ML IJ SOLN
5000.0000 [IU] | Freq: Three times a day (TID) | INTRAMUSCULAR | Status: DC
  Administered 2017-09-11 – 2017-09-12 (×4): 5000 [IU] via SUBCUTANEOUS
  Filled 2017-09-11 (×4): qty 1

## 2017-09-11 MED ORDER — HYDROCODONE-ACETAMINOPHEN 5-325 MG PO TABS: 1.0000 | ORAL_TABLET | ORAL | Status: DC | PRN

## 2017-09-11 NOTE — Progress Notes (Signed)
Nutrition Brief Note  Patient seen as part of the COPD Gold Protocol set  Wt Readings from Last 15 Encounters:  09/11/17 241 lb 9.6 oz (109.6 kg)  08/26/17 243 lb 3.2 oz (110.3 kg)  08/07/17 243 lb 8 oz (110.5 kg)  07/09/17 233 lb (105.7 kg)  06/16/17 233 lb (105.7 kg)  06/05/17 233 lb 4 oz (105.8 kg)  05/08/17 235 lb 1.6 oz (106.6 kg)  04/12/17 242 lb (109.8 kg)  03/12/17 242 lb 11.2 oz (110.1 kg)  03/02/17 245 lb 3.2 oz (111.2 kg)  02/19/17 245 lb 9.6 oz (111.4 kg)  02/18/17 242 lb 8 oz (110 kg)  02/10/17 253 lb (114.8 kg)  01/26/17 253 lb 12 oz (115.1 kg)  01/01/17 257 lb (116.6 kg)   Body mass index is 31.88 kg/m. Patient meets criteria for Obese based on current BMI.   Pt is well known to this RD due to recurrent admissions and time at [facility]. He does not say much.   He denies his breathing/COPD having any affect on his appetite. He says he was eating well at home. At baseline he doesn't take any supplements. He estimates is UBW as 134. Per review of chart, UBW is difficult to determine as he has CKD4 + CHF. Appears to fluctuate between 235 and 245 lbs for the past year. Gradual wt loss would be beneficial.    Current diet order is Heart Healthy. There is no documented intake of meals at this time.  No nutrition interventions warranted at this time. If nutrition issues arise, please consult RD.   Burtis Junes RD, LDN, CNSC Clinical Nutrition Pager: [phone] 09/11/2017 11:23 AM
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
## 2017-09-11 NOTE — Plan of Care (Signed)
Problem: Safety: Goal: Ability to remain free from injury will improve Outcome: Adequate for Discharge Pt moderate fall risk d/t medications. Pt up with standby assist, and also c/o generalized weakness. Pt educated on fall risk. Pt verbalized understanding. Will continue to monitor pt

## 2017-09-11 NOTE — ED Notes (Signed)
Message left with pt's family notifying them of room number per request, pt updated on plan of care

## 2017-09-11 NOTE — Care Management Note (Signed)
Case Management Note  Patient Details  Name: [name] [name] MRN: [id number] Date of Birth: 1946/07/21  Subjective/Objective:                  Pt admitted with COPD exacerbation. Pt is from home, lives alone, has dtr who provides support. He has PCP, transportation. Insurance with drug coverage. He has home oxygen pta. No HH or DME needs at DC. Pt declines all [place] services.   Action/Plan: DC home over weekend with self care. No CM needs noted at this time.   Expected Discharge Date:     09/12/2017             Expected Discharge Plan:  Home/Self Care  In-House Referral:  NA  Discharge planning Services  CM Consult  Post Acute Care Choice:  NA Choice offered to:  NA  Status of Service:  Completed, signed off   Sherald Barge, RN 09/11/2017, 1:39 PM

## 2017-09-11 NOTE — Care Management Important Message (Signed)
Important Message  Patient Details  Name: BONNER LARUE MRN: [id number] Date of Birth: [birth year]   Medicare Important Message Given:  Yes    Sherald Barge, RN 09/11/2017, 1:39 PM

## 2017-09-11 NOTE — ED Notes (Signed)
Dr Opyd at bedside,  
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
## 2017-09-11 NOTE — ED Notes (Signed)
Report given to floor,

## 2017-09-11 NOTE — ED Notes (Signed)
Pts O2 maintained at 92% while ambulating, pt walked aprox 10 ft, pt was SOB

## 2017-09-11 NOTE — ED Notes (Signed)
CONTACT INFORMATION NADINE [phone]
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
## 2017-09-12 DIAGNOSIS — E038 Other specified hypothyroidism: Secondary | ICD-10-CM

## 2017-09-12 DIAGNOSIS — J441 Chronic obstructive pulmonary disease with (acute) exacerbation: Secondary | ICD-10-CM | POA: Diagnosis not present

## 2017-09-12 DIAGNOSIS — I5042 Chronic combined systolic (congestive) and diastolic (congestive) heart failure: Secondary | ICD-10-CM | POA: Diagnosis not present

## 2017-09-12 DIAGNOSIS — I5022 Chronic systolic (congestive) heart failure: Secondary | ICD-10-CM

## 2017-09-12 DIAGNOSIS — G40909 Epilepsy, unspecified, not intractable, without status epilepticus: Secondary | ICD-10-CM | POA: Diagnosis not present

## 2017-09-12 DIAGNOSIS — J9621 Acute and chronic respiratory failure with hypoxia: Secondary | ICD-10-CM

## 2017-09-12 DIAGNOSIS — E230 Hypopituitarism: Secondary | ICD-10-CM | POA: Diagnosis not present

## 2017-09-12 DIAGNOSIS — I452 Bifascicular block: Secondary | ICD-10-CM | POA: Diagnosis not present

## 2017-09-12 DIAGNOSIS — I440 Atrioventricular block, first degree: Secondary | ICD-10-CM | POA: Diagnosis not present

## 2017-09-12 DIAGNOSIS — N184 Chronic kidney disease, stage 4 (severe): Secondary | ICD-10-CM

## 2017-09-12 DIAGNOSIS — I130 Hypertensive heart and chronic kidney disease with heart failure and stage 1 through stage 4 chronic kidney disease, or unspecified chronic kidney disease: Secondary | ICD-10-CM | POA: Diagnosis not present

## 2017-09-12 LAB — CBC
HCT: 32.2 % — ABNORMAL LOW (ref 39.0–52.0)
HEMOGLOBIN: 10.1 g/dL — AB (ref 13.0–17.0)
MCH: 30.1 pg (ref 26.0–34.0)
MCHC: 31.4 g/dL (ref 30.0–36.0)
MCV: 95.8 fL (ref 78.0–100.0)
PLATELETS: 175 10*3/uL (ref 150–400)
RBC: 3.36 MIL/uL — AB (ref 4.22–5.81)
RDW: 15.3 % (ref 11.5–15.5)
WBC: 6 10*3/uL (ref 4.0–10.5)

## 2017-09-12 LAB — BASIC METABOLIC PANEL
ANION GAP: 10 (ref 5–15)
BUN: 81 mg/dL — ABNORMAL HIGH (ref 6–20)
CO2: 20 mmol/L — ABNORMAL LOW (ref 22–32)
Calcium: 8.6 mg/dL — ABNORMAL LOW (ref 8.9–10.3)
Chloride: 109 mmol/L (ref 101–111)
Creatinine, Ser: 2.46 mg/dL — ABNORMAL HIGH (ref 0.61–1.24)
GFR calc Af Amer: 29 mL/min — ABNORMAL LOW (ref >60)
GFR, EST NON AFRICAN AMERICAN: 25 mL/min — AB (ref >60)
Glucose, Bld: 107 mg/dL — ABNORMAL HIGH (ref 65–99)
POTASSIUM: 4.6 mmol/L (ref 3.5–5.1)
Sodium: 139 mmol/L (ref 135–145)

## 2017-09-12 LAB — GLUCOSE, CAPILLARY: GLUCOSE-CAPILLARY: 110 mg/dL — AB (ref 65–99)

## 2017-09-12 MED ORDER — PREDNISONE 10 MG (21) PO TBPK: ORAL_TABLET | ORAL | 0 refills | Status: DC

## 2017-09-12 MED ORDER — ALBUTEROL SULFATE (2.5 MG/3ML) 0.083% IN NEBU: 2.5000 mg | INHALATION_SOLUTION | RESPIRATORY_TRACT | 12 refills | Status: AC | PRN
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
## 2017-09-12 NOTE — Discharge Instructions (Signed)
Hold Decadron while you're taking prednisone. Resume Decadron once prednisone is finished.

## 2017-09-12 NOTE — Progress Notes (Signed)
Patient states understanding of discharge instructions.  

## 2017-09-18 ENCOUNTER — Inpatient Hospital Stay (HOSPITAL_COMMUNITY)
Admission: EM | Admit: 2017-09-18 | Discharge: 2017-09-22 | DRG: 291 | Disposition: A | Discharge status: Home / Self Care | Payer: Medicare HMO | Attending: Internal Medicine | Admitting: Internal Medicine

## 2017-09-18 ENCOUNTER — Encounter (HOSPITAL_COMMUNITY): Payer: Self-pay | Admitting: Emergency Medicine

## 2017-09-18 ENCOUNTER — Other Ambulatory Visit (HOSPITAL_COMMUNITY): Payer: Self-pay

## 2017-09-18 ENCOUNTER — Emergency Department (HOSPITAL_COMMUNITY): Payer: Medicare HMO

## 2017-09-18 ENCOUNTER — Other Ambulatory Visit: Payer: Self-pay

## 2017-09-18 DIAGNOSIS — G4730 Sleep apnea, unspecified: Secondary | ICD-10-CM | POA: Diagnosis present

## 2017-09-18 DIAGNOSIS — Z9981 Dependence on supplemental oxygen: Secondary | ICD-10-CM | POA: Diagnosis not present

## 2017-09-18 DIAGNOSIS — E039 Hypothyroidism, unspecified: Secondary | ICD-10-CM | POA: Diagnosis present

## 2017-09-18 DIAGNOSIS — G40909 Epilepsy, unspecified, not intractable, without status epilepticus: Secondary | ICD-10-CM

## 2017-09-18 DIAGNOSIS — Z23 Encounter for immunization: Secondary | ICD-10-CM

## 2017-09-18 DIAGNOSIS — I10 Essential (primary) hypertension: Secondary | ICD-10-CM | POA: Diagnosis not present

## 2017-09-18 DIAGNOSIS — I4510 Unspecified right bundle-branch block: Secondary | ICD-10-CM | POA: Diagnosis present

## 2017-09-18 DIAGNOSIS — I5033 Acute on chronic diastolic (congestive) heart failure: Secondary | ICD-10-CM | POA: Diagnosis not present

## 2017-09-18 DIAGNOSIS — E559 Vitamin D deficiency, unspecified: Secondary | ICD-10-CM | POA: Diagnosis present

## 2017-09-18 DIAGNOSIS — E785 Hyperlipidemia, unspecified: Secondary | ICD-10-CM | POA: Diagnosis present

## 2017-09-18 DIAGNOSIS — D72829 Elevated white blood cell count, unspecified: Secondary | ICD-10-CM | POA: Diagnosis present

## 2017-09-18 DIAGNOSIS — G9389 Other specified disorders of brain: Secondary | ICD-10-CM | POA: Diagnosis present

## 2017-09-18 DIAGNOSIS — Z85528 Personal history of other malignant neoplasm of kidney: Secondary | ICD-10-CM

## 2017-09-18 DIAGNOSIS — Z7952 Long term (current) use of systemic steroids: Secondary | ICD-10-CM

## 2017-09-18 DIAGNOSIS — I472 Ventricular tachycardia: Secondary | ICD-10-CM | POA: Diagnosis present

## 2017-09-18 DIAGNOSIS — E1122 Type 2 diabetes mellitus with diabetic chronic kidney disease: Secondary | ICD-10-CM | POA: Diagnosis present

## 2017-09-18 DIAGNOSIS — I5043 Acute on chronic combined systolic (congestive) and diastolic (congestive) heart failure: Secondary | ICD-10-CM | POA: Diagnosis not present

## 2017-09-18 DIAGNOSIS — D649 Anemia, unspecified: Secondary | ICD-10-CM | POA: Diagnosis present

## 2017-09-18 DIAGNOSIS — Z905 Acquired absence of kidney: Secondary | ICD-10-CM

## 2017-09-18 DIAGNOSIS — Z79899 Other long term (current) drug therapy: Secondary | ICD-10-CM

## 2017-09-18 DIAGNOSIS — N184 Chronic kidney disease, stage 4 (severe): Secondary | ICD-10-CM | POA: Diagnosis present

## 2017-09-18 DIAGNOSIS — Z87891 Personal history of nicotine dependence: Secondary | ICD-10-CM

## 2017-09-18 DIAGNOSIS — I272 Pulmonary hypertension, unspecified: Secondary | ICD-10-CM | POA: Diagnosis present

## 2017-09-18 DIAGNOSIS — J9611 Chronic respiratory failure with hypoxia: Secondary | ICD-10-CM | POA: Diagnosis not present

## 2017-09-18 DIAGNOSIS — E230 Hypopituitarism: Secondary | ICD-10-CM | POA: Diagnosis present

## 2017-09-18 DIAGNOSIS — Z6832 Body mass index (BMI) 32.0-32.9, adult: Secondary | ICD-10-CM

## 2017-09-18 DIAGNOSIS — I351 Nonrheumatic aortic (valve) insufficiency: Secondary | ICD-10-CM | POA: Diagnosis present

## 2017-09-18 DIAGNOSIS — E669 Obesity, unspecified: Secondary | ICD-10-CM | POA: Diagnosis present

## 2017-09-18 DIAGNOSIS — R7989 Other specified abnormal findings of blood chemistry: Secondary | ICD-10-CM | POA: Diagnosis present

## 2017-09-18 DIAGNOSIS — E8770 Fluid overload, unspecified: Secondary | ICD-10-CM | POA: Diagnosis not present

## 2017-09-18 DIAGNOSIS — R0602 Shortness of breath: Secondary | ICD-10-CM | POA: Diagnosis not present

## 2017-09-18 DIAGNOSIS — J441 Chronic obstructive pulmonary disease with (acute) exacerbation: Secondary | ICD-10-CM | POA: Diagnosis not present

## 2017-09-18 DIAGNOSIS — R079 Chest pain, unspecified: Secondary | ICD-10-CM | POA: Diagnosis not present

## 2017-09-18 DIAGNOSIS — R600 Localized edema: Secondary | ICD-10-CM

## 2017-09-18 DIAGNOSIS — I130 Hypertensive heart and chronic kidney disease with heart failure and stage 1 through stage 4 chronic kidney disease, or unspecified chronic kidney disease: Secondary | ICD-10-CM | POA: Diagnosis not present

## 2017-09-18 DIAGNOSIS — B3781 Candidal esophagitis: Secondary | ICD-10-CM | POA: Diagnosis not present

## 2017-09-18 DIAGNOSIS — Z8249 Family history of ischemic heart disease and other diseases of the circulatory system: Secondary | ICD-10-CM

## 2017-09-18 DIAGNOSIS — E232 Diabetes insipidus: Secondary | ICD-10-CM

## 2017-09-18 DIAGNOSIS — I2510 Atherosclerotic heart disease of native coronary artery without angina pectoris: Secondary | ICD-10-CM | POA: Diagnosis present

## 2017-09-18 DIAGNOSIS — Z7951 Long term (current) use of inhaled steroids: Secondary | ICD-10-CM

## 2017-09-18 DIAGNOSIS — Z7982 Long term (current) use of aspirin: Secondary | ICD-10-CM

## 2017-09-18 DIAGNOSIS — Z79890 Hormone replacement therapy (postmenopausal): Secondary | ICD-10-CM

## 2017-09-18 DIAGNOSIS — R748 Abnormal levels of other serum enzymes: Secondary | ICD-10-CM | POA: Diagnosis present

## 2017-09-18 LAB — COMPREHENSIVE METABOLIC PANEL
ALBUMIN: 3.9 g/dL (ref 3.5–5.0)
ALT: 55 U/L (ref 17–63)
ANION GAP: 13 (ref 5–15)
AST: 28 U/L (ref 15–41)
Alkaline Phosphatase: 44 U/L (ref 38–126)
BUN: 88 mg/dL — AB (ref 6–20)
CHLORIDE: 111 mmol/L (ref 101–111)
CO2: 21 mmol/L — ABNORMAL LOW (ref 22–32)
Calcium: 9 mg/dL (ref 8.9–10.3)
Creatinine, Ser: 2.59 mg/dL — ABNORMAL HIGH (ref 0.61–1.24)
GFR calc Af Amer: 27 mL/min — ABNORMAL LOW (ref >60)
GFR calc non Af Amer: 23 mL/min — ABNORMAL LOW (ref >60)
GLUCOSE: 82 mg/dL (ref 65–99)
POTASSIUM: 4.9 mmol/L (ref 3.5–5.1)
SODIUM: 145 mmol/L (ref 135–145)
Total Bilirubin: 0.8 mg/dL (ref 0.3–1.2)
Total Protein: 7.3 g/dL (ref 6.5–8.1)

## 2017-09-18 LAB — CBC WITH DIFFERENTIAL/PLATELET
BASOS ABS: 0.1 10*3/uL (ref 0.0–0.1)
BASOS PCT: 1 %
EOS ABS: 0.6 10*3/uL (ref 0.0–0.7)
EOS PCT: 5 %
HCT: 33.6 % — ABNORMAL LOW (ref 39.0–52.0)
Hemoglobin: 10.5 g/dL — ABNORMAL LOW (ref 13.0–17.0)
Lymphocytes Relative: 8 %
Lymphs Abs: 0.9 10*3/uL (ref 0.7–4.0)
MCH: 29.9 pg (ref 26.0–34.0)
MCHC: 31.3 g/dL (ref 30.0–36.0)
MCV: 95.7 fL (ref 78.0–100.0)
MONO ABS: 1.1 10*3/uL — AB (ref 0.1–1.0)
Monocytes Relative: 9 %
NEUTROS ABS: 8.8 10*3/uL — AB (ref 1.7–7.7)
Neutrophils Relative %: 77 %
PLATELETS: 205 10*3/uL (ref 150–400)
RBC: 3.51 MIL/uL — ABNORMAL LOW (ref 4.22–5.81)
RDW: 16.5 % — AB (ref 11.5–15.5)
WBC: 11.5 10*3/uL — ABNORMAL HIGH (ref 4.0–10.5)

## 2017-09-18 LAB — TROPONIN I: Troponin I: 0.04 ng/mL (ref <0.03)

## 2017-09-18 LAB — BRAIN NATRIURETIC PEPTIDE: B Natriuretic Peptide: 2356 pg/mL — ABNORMAL HIGH (ref 0.0–100.0)

## 2017-09-18 MED ORDER — SODIUM CHLORIDE 0.9 % IV SOLN: 250.0000 mL | INTRAVENOUS | Status: DC | PRN

## 2017-09-18 MED ORDER — ALPRAZOLAM 0.5 MG PO TABS: 0.2500 mg | ORAL_TABLET | Freq: Two times a day (BID) | ORAL | Status: DC | PRN

## 2017-09-18 MED ORDER — SODIUM CHLORIDE 0.9% FLUSH
3.0000 mL | Freq: Two times a day (BID) | INTRAVENOUS | Status: DC
  Administered 2017-09-19 – 2017-09-22 (×8): 3 mL via INTRAVENOUS

## 2017-09-18 MED ORDER — ACETAMINOPHEN 325 MG PO TABS
650.0000 mg | ORAL_TABLET | ORAL | Status: DC | PRN
  Administered 2017-09-20: 650 mg via ORAL
  Filled 2017-09-18: qty 2

## 2017-09-18 MED ORDER — LEVETIRACETAM 250 MG PO TABS
250.0000 mg | ORAL_TABLET | Freq: Two times a day (BID) | ORAL | Status: DC
  Administered 2017-09-19 – 2017-09-22 (×8): 250 mg via ORAL
  Filled 2017-09-18 (×8): qty 1

## 2017-09-18 MED ORDER — ALBUTEROL (5 MG/ML) CONTINUOUS INHALATION SOLN
10.0000 mg/h | INHALATION_SOLUTION | RESPIRATORY_TRACT | Status: DC
  Administered 2017-09-18: 10 mg/h via RESPIRATORY_TRACT
  Filled 2017-09-18: qty 20

## 2017-09-18 MED ORDER — CALCITRIOL 0.5 MCG PO CAPS
0.5000 ug | ORAL_CAPSULE | Freq: Every day | ORAL | Status: DC
  Administered 2017-09-19 – 2017-09-22 (×4): 0.5 ug via ORAL
  Filled 2017-09-18 (×5): qty 1

## 2017-09-18 MED ORDER — TIOTROPIUM BROMIDE MONOHYDRATE 18 MCG IN CAPS
1.0000 | ORAL_CAPSULE | Freq: Every day | RESPIRATORY_TRACT | Status: DC
  Administered 2017-09-19 – 2017-09-22 (×4): 18 ug via RESPIRATORY_TRACT
  Filled 2017-09-18: qty 5

## 2017-09-18 MED ORDER — POTASSIUM CHLORIDE CRYS ER 20 MEQ PO TBCR
20.0000 meq | EXTENDED_RELEASE_TABLET | Freq: Every day | ORAL | Status: DC
  Administered 2017-09-19 – 2017-09-22 (×4): 20 meq via ORAL
  Filled 2017-09-18 (×5): qty 1

## 2017-09-18 MED ORDER — PREDNISONE 50 MG PO TABS
50.0000 mg | ORAL_TABLET | Freq: Every day | ORAL | Status: DC
  Administered 2017-09-19 – 2017-09-22 (×4): 50 mg via ORAL
  Filled 2017-09-18 (×5): qty 1

## 2017-09-18 MED ORDER — COLCHICINE 0.6 MG PO TABS
0.6000 mg | ORAL_TABLET | Freq: Every day | ORAL | Status: DC
  Administered 2017-09-19 – 2017-09-22 (×4): 0.6 mg via ORAL
  Filled 2017-09-18 (×4): qty 1

## 2017-09-18 MED ORDER — SIMVASTATIN 10 MG PO TABS
20.0000 mg | ORAL_TABLET | Freq: Every day | ORAL | Status: DC
  Administered 2017-09-19 – 2017-09-21 (×2): 20 mg via ORAL
  Filled 2017-09-18 (×2): qty 2

## 2017-09-18 MED ORDER — FUROSEMIDE 10 MG/ML IJ SOLN
60.0000 mg | Freq: Two times a day (BID) | INTRAMUSCULAR | Status: DC
Start: 2017-09-19 — End: 2017-09-21
  Administered 2017-09-19 – 2017-09-21 (×4): 60 mg via INTRAVENOUS
  Filled 2017-09-18 (×4): qty 6

## 2017-09-18 MED ORDER — LEVOTHYROXINE SODIUM 50 MCG PO TABS
125.0000 ug | ORAL_TABLET | Freq: Every day | ORAL | Status: DC
  Administered 2017-09-19 – 2017-09-22 (×4): 125 ug via ORAL
  Filled 2017-09-18 (×4): qty 3

## 2017-09-18 MED ORDER — CLONIDINE HCL 0.2 MG PO TABS
0.2000 mg | ORAL_TABLET | Freq: Three times a day (TID) | ORAL | Status: DC
  Administered 2017-09-19 – 2017-09-21 (×10): 0.2 mg via ORAL
  Filled 2017-09-18 (×11): qty 1

## 2017-09-18 MED ORDER — ISOSORB DINITRATE-HYDRALAZINE 20-37.5 MG PO TABS
1.0000 | ORAL_TABLET | Freq: Three times a day (TID) | ORAL | Status: DC
  Administered 2017-09-19 – 2017-09-22 (×11): 1 via ORAL
  Filled 2017-09-18 (×15): qty 1

## 2017-09-18 MED ORDER — MOMETASONE FURO-FORMOTEROL FUM 200-5 MCG/ACT IN AERO
2.0000 | INHALATION_SPRAY | Freq: Two times a day (BID) | RESPIRATORY_TRACT | Status: DC
Start: 2017-09-18 — End: 2017-09-22
  Administered 2017-09-19 – 2017-09-22 (×7): 2 via RESPIRATORY_TRACT
  Filled 2017-09-18: qty 8.8

## 2017-09-18 MED ORDER — HEPARIN SODIUM (PORCINE) 5000 UNIT/ML IJ SOLN
5000.0000 [IU] | Freq: Three times a day (TID) | INTRAMUSCULAR | Status: DC
  Administered 2017-09-19 – 2017-09-21 (×7): 5000 [IU] via SUBCUTANEOUS
  Filled 2017-09-18 (×9): qty 1

## 2017-09-18 MED ORDER — ALBUTEROL SULFATE (2.5 MG/3ML) 0.083% IN NEBU
2.5000 mg | INHALATION_SOLUTION | RESPIRATORY_TRACT | Status: DC | PRN
  Administered 2017-09-21: 2.5 mg via RESPIRATORY_TRACT
  Filled 2017-09-18: qty 3

## 2017-09-18 MED ORDER — ASPIRIN EC 81 MG PO TBEC
81.0000 mg | DELAYED_RELEASE_TABLET | Freq: Every day | ORAL | Status: DC
  Administered 2017-09-19 – 2017-09-22 (×4): 81 mg via ORAL
  Filled 2017-09-18 (×4): qty 1

## 2017-09-18 MED ORDER — ONDANSETRON HCL 4 MG/2ML IJ SOLN: 4.0000 mg | Freq: Four times a day (QID) | INTRAMUSCULAR | Status: DC | PRN

## 2017-09-18 MED ORDER — SODIUM CHLORIDE 0.9% FLUSH: 3.0000 mL | INTRAVENOUS | Status: DC | PRN

## 2017-09-18 MED ORDER — PHENYTOIN SODIUM EXTENDED 100 MG PO CAPS
100.0000 mg | ORAL_CAPSULE | Freq: Three times a day (TID) | ORAL | Status: DC
  Administered 2017-09-19 – 2017-09-22 (×11): 100 mg via ORAL
  Filled 2017-09-18 (×11): qty 1

## 2017-09-18 MED ORDER — FUROSEMIDE 10 MG/ML IJ SOLN
60.0000 mg | Freq: Once | INTRAMUSCULAR | Status: AC
  Administered 2017-09-18: 60 mg via INTRAVENOUS
  Filled 2017-09-18: qty 6

## 2017-09-18 MED ORDER — DESMOPRESSIN ACETATE 0.2 MG PO TABS
0.2000 mg | ORAL_TABLET | Freq: Two times a day (BID) | ORAL | Status: DC
  Administered 2017-09-19 – 2017-09-21 (×7): 0.2 mg via ORAL
  Filled 2017-09-18 (×11): qty 1

## 2017-09-18 MED ORDER — TAMSULOSIN HCL 0.4 MG PO CAPS
0.4000 mg | ORAL_CAPSULE | Freq: Every day | ORAL | Status: DC
  Administered 2017-09-19 – 2017-09-21 (×2): 0.4 mg via ORAL
  Filled 2017-09-18 (×2): qty 1

## 2017-09-18 MED ORDER — AMLODIPINE BESYLATE 5 MG PO TABS
10.0000 mg | ORAL_TABLET | Freq: Every day | ORAL | Status: DC
  Administered 2017-09-19 – 2017-09-22 (×4): 10 mg via ORAL
  Filled 2017-09-18 (×4): qty 2

## 2017-09-18 MED ORDER — IPRATROPIUM-ALBUTEROL 0.5-2.5 (3) MG/3ML IN SOLN: 3.0000 mL | Freq: Two times a day (BID) | RESPIRATORY_TRACT | Status: DC

## 2017-09-18 MED ORDER — METHYLPREDNISOLONE SODIUM SUCC 125 MG IJ SOLR
125.0000 mg | Freq: Once | INTRAMUSCULAR | Status: AC
  Administered 2017-09-18: 125 mg via INTRAMUSCULAR
  Filled 2017-09-18: qty 2

## 2017-09-18 MED ORDER — ALBUTEROL SULFATE (2.5 MG/3ML) 0.083% IN NEBU
2.5000 mg | INHALATION_SOLUTION | Freq: Two times a day (BID) | RESPIRATORY_TRACT | Status: DC
  Administered 2017-09-19 – 2017-09-22 (×8): 2.5 mg via RESPIRATORY_TRACT
  Filled 2017-09-18 (×7): qty 3

## 2017-09-18 NOTE — ED Notes (Signed)
Patient is resting comfortably. 
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
## 2017-09-18 NOTE — ED Notes (Addendum)
Date and time results received: 09/18/17 9:35 PM Test: troponin Critical Value: 0.04  Name of Provider Notified: Dr.Yelverton  Orders Received? Or Actions Taken?: MD notified.

## 2017-09-19 DIAGNOSIS — I5043 Acute on chronic combined systolic (congestive) and diastolic (congestive) heart failure: Secondary | ICD-10-CM

## 2017-09-19 DIAGNOSIS — J441 Chronic obstructive pulmonary disease with (acute) exacerbation: Secondary | ICD-10-CM

## 2017-09-19 DIAGNOSIS — N184 Chronic kidney disease, stage 4 (severe): Secondary | ICD-10-CM

## 2017-09-19 LAB — BASIC METABOLIC PANEL
ANION GAP: 12 (ref 5–15)
BUN: 90 mg/dL — ABNORMAL HIGH (ref 6–20)
CHLORIDE: 108 mmol/L (ref 101–111)
CO2: 21 mmol/L — ABNORMAL LOW (ref 22–32)
Calcium: 8.5 mg/dL — ABNORMAL LOW (ref 8.9–10.3)
Creatinine, Ser: 2.43 mg/dL — ABNORMAL HIGH (ref 0.61–1.24)
GFR calc Af Amer: 29 mL/min — ABNORMAL LOW (ref >60)
GFR, EST NON AFRICAN AMERICAN: 25 mL/min — AB (ref >60)
GLUCOSE: 133 mg/dL — AB (ref 65–99)
POTASSIUM: 4.6 mmol/L (ref 3.5–5.1)
Sodium: 141 mmol/L (ref 135–145)

## 2017-09-19 MED ORDER — PNEUMOCOCCAL VAC POLYVALENT 25 MCG/0.5ML IJ INJ
0.5000 mL | INJECTION | INTRAMUSCULAR | Status: AC
  Administered 2017-09-20: 0.5 mL via INTRAMUSCULAR
  Filled 2017-09-19: qty 0.5

## 2017-09-19 MED ORDER — INFLUENZA VAC SPLIT HIGH-DOSE 0.5 ML IM SUSY
0.5000 mL | PREFILLED_SYRINGE | INTRAMUSCULAR | Status: AC
  Administered 2017-09-20: 0.5 mL via INTRAMUSCULAR
  Filled 2017-09-19: qty 0.5
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
## 2017-09-20 DIAGNOSIS — I5033 Acute on chronic diastolic (congestive) heart failure: Secondary | ICD-10-CM

## 2017-09-20 LAB — BASIC METABOLIC PANEL
Anion gap: 12 (ref 5–15)
BUN: 96 mg/dL — AB (ref 6–20)
CO2: 22 mmol/L (ref 22–32)
Calcium: 8.5 mg/dL — ABNORMAL LOW (ref 8.9–10.3)
Chloride: 108 mmol/L (ref 101–111)
Creatinine, Ser: 2.48 mg/dL — ABNORMAL HIGH (ref 0.61–1.24)
GFR calc Af Amer: 28 mL/min — ABNORMAL LOW (ref >60)
GFR, EST NON AFRICAN AMERICAN: 24 mL/min — AB (ref >60)
GLUCOSE: 124 mg/dL — AB (ref 65–99)
Potassium: 4.1 mmol/L (ref 3.5–5.1)
Sodium: 142 mmol/L (ref 135–145)

## 2017-09-20 LAB — MAGNESIUM: Magnesium: 2.2 mg/dL (ref 1.7–2.4)
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
## 2017-09-21 DIAGNOSIS — I472 Ventricular tachycardia: Secondary | ICD-10-CM

## 2017-09-21 LAB — BASIC METABOLIC PANEL
Anion gap: 10 (ref 5–15)
BUN: 96 mg/dL — AB (ref 6–20)
CHLORIDE: 108 mmol/L (ref 101–111)
CO2: 20 mmol/L — AB (ref 22–32)
CREATININE: 2.42 mg/dL — AB (ref 0.61–1.24)
Calcium: 8.5 mg/dL — ABNORMAL LOW (ref 8.9–10.3)
GFR calc Af Amer: 29 mL/min — ABNORMAL LOW (ref >60)
GFR calc non Af Amer: 25 mL/min — ABNORMAL LOW (ref >60)
GLUCOSE: 79 mg/dL (ref 65–99)
Potassium: 4.2 mmol/L (ref 3.5–5.1)
SODIUM: 138 mmol/L (ref 135–145)

## 2017-09-21 LAB — MAGNESIUM: Magnesium: 2.3 mg/dL (ref 1.7–2.4)

## 2017-09-21 MED ORDER — FUROSEMIDE 10 MG/ML IJ SOLN
40.0000 mg | Freq: Every day | INTRAMUSCULAR | Status: DC
  Administered 2017-09-22: 40 mg via INTRAVENOUS
  Filled 2017-09-21: qty 4

## 2017-09-21 MED ORDER — GUAIFENESIN ER 600 MG PO TB12
600.0000 mg | ORAL_TABLET | ORAL | Status: AC
  Administered 2017-09-21: 600 mg via ORAL

## 2017-09-21 MED ORDER — GUAIFENESIN ER 600 MG PO TB12
600.0000 mg | ORAL_TABLET | Freq: Two times a day (BID) | ORAL | Status: DC
  Administered 2017-09-21 – 2017-09-22 (×2): 600 mg via ORAL
  Filled 2017-09-21 (×3): qty 1

## 2017-09-21 MED ORDER — ALUM & MAG HYDROXIDE-SIMETH 200-200-20 MG/5ML PO SUSP
30.0000 mL | Freq: Four times a day (QID) | ORAL | Status: DC | PRN
  Administered 2017-09-21: 30 mL via ORAL
  Filled 2017-09-21: qty 30
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
## 2017-09-21 NOTE — Clinical Social Work Note (Signed)
LCSW explained COPD protocol to patient. Patient declined to participate in COPD Gold protocol.   LCSW signing off.     Leandrew Keech, Clydene Pugh, LCSW

## 2017-09-21 NOTE — Progress Notes (Signed)
OT Cancellation Note  Patient Details Name: TYVON EGGENBERGER MRN: [id number] DOB: July 07, 1946   Cancelled Treatment:    Reason Eval/Treat Not Completed: OT screened, no needs identified, will sign off    Ailene Ravel, OTR/L,CBIS  [phone]  09/21/2017, 5:58 PM

## 2017-09-22 DIAGNOSIS — Z23 Encounter for immunization: Secondary | ICD-10-CM | POA: Diagnosis not present

## 2017-09-22 DIAGNOSIS — I10 Essential (primary) hypertension: Secondary | ICD-10-CM

## 2017-09-22 LAB — BASIC METABOLIC PANEL
ANION GAP: 11 (ref 5–15)
BUN: 101 mg/dL — ABNORMAL HIGH (ref 6–20)
CO2: 22 mmol/L (ref 22–32)
Calcium: 8.9 mg/dL (ref 8.9–10.3)
Chloride: 106 mmol/L (ref 101–111)
Creatinine, Ser: 2.66 mg/dL — ABNORMAL HIGH (ref 0.61–1.24)
GFR, EST AFRICAN AMERICAN: 26 mL/min — AB (ref >60)
GFR, EST NON AFRICAN AMERICAN: 22 mL/min — AB (ref >60)
Glucose, Bld: 93 mg/dL (ref 65–99)
POTASSIUM: 5 mmol/L (ref 3.5–5.1)
SODIUM: 139 mmol/L (ref 135–145)

## 2017-09-22 MED ORDER — GUAIFENESIN ER 600 MG PO TB12: 600.0000 mg | ORAL_TABLET | Freq: Two times a day (BID) | ORAL | 0 refills | Status: AC

## 2017-09-22 MED ORDER — PREDNISONE 20 MG PO TABS: 40.0000 mg | ORAL_TABLET | Freq: Every day | ORAL | 0 refills | Status: AC

## 2017-09-22 NOTE — Progress Notes (Signed)
AVS reviewed with patient.  Verbalized understanding of discharge instructions, physician follow-up, medications.  Prescription given to patient.  Patient transported via wheelchair by another nurse to main entrance for discharge.  Patient stable at time of discharge.

## 2017-09-22 NOTE — Care Management Important Message (Signed)
Important Message  Patient Details  Name: [name] [name] MRN: [id number] Date of Birth: 31-Jan-1946   Medicare Important Message Given:  Yes    Sherald Barge, RN 09/22/2017, 10:25 AM

## 2017-09-22 NOTE — Discharge Instructions (Signed)
Heart Failure °Heart failure is a condition in which the heart has trouble pumping blood because it has become weak or stiff. This means that the heart does not pump blood efficiently for the body to work well. For some people with heart failure, fluid may back up into the lungs and there may be swelling (edema) in the lower legs. Heart failure is usually a long-term (chronic) condition. It is important for you to take good care of yourself and follow the treatment plan from your health care provider. °What are the causes? °This condition is caused by some health problems, including: °· High blood pressure (hypertension). Hypertension causes the heart muscle to work harder than normal. High blood pressure eventually causes the heart to become stiff and weak. °· Coronary artery disease (CAD). CAD is the buildup of cholesterol and fat (plaques) in the arteries of the heart. °· Heart attack (myocardial infarction). Injured tissue, which is caused by the heart attack, does not contract as well and the heart's ability to pump blood is weakened. °· Abnormal heart valves. When the heart valves do not open and close properly, the heart muscle must pump harder to keep the blood flowing. °· Heart muscle disease (cardiomyopathy or myocarditis). Heart muscle disease is damage to the heart muscle from a variety of causes, such as drug or alcohol abuse, infections, or unknown causes. These can increase the risk of heart failure. °· Lung disease. When the lungs do not work properly, the heart must work harder. ° °What increases the risk? °Risk of heart failure increases as a person ages. This condition is also more likely to develop in people who: °· Are overweight. °· Are male. °· Smoke or chew tobacco. °· Abuse alcohol or illegal drugs. °· Have taken medicines that can damage the heart, such as chemotherapy drugs. °· Have diabetes. °? High blood sugar (glucose) is associated with high fat (lipid) levels in the blood. °? Diabetes  can also damage tiny blood vessels that carry nutrients to the heart muscle. °· Have abnormal heart rhythms. °· Have thyroid problems. °· Have low blood counts (anemia). ° °What are the signs or symptoms? °Symptoms of this condition include: °· Shortness of breath with activity, such as when climbing stairs. °· Persistent cough. °· Swelling of the feet, ankles, legs, or abdomen. °· Unexplained weight gain. °· Difficulty breathing when lying flat (orthopnea). °· Waking from sleep because of the need to sit up and get more air. °· Rapid heartbeat. °· Fatigue and loss of energy. °· Feeling light-headed, dizzy, or close to fainting. °· Loss of appetite. °· Nausea. °· Increased urination during the night (nocturia). °· Confusion. ° °How is this diagnosed? °This condition is diagnosed based on: °· Medical history, symptoms, and a physical exam. °· Diagnostic tests, which may include: °? Echocardiogram. °? Electrocardiogram (ECG). °? Chest X-ray. °? Blood tests. °? Exercise stress test. °? Radionuclide scans. °? Cardiac catheterization and angiogram. ° °How is this treated? °Treatment for this condition is aimed at managing the symptoms of heart failure. Medicines, behavioral changes, or other treatments may be necessary to treat heart failure. °Medicines °These may include: °· Angiotensin-converting enzyme (ACE) inhibitors. This type of medicine blocks the effects of a blood protein called angiotensin-converting enzyme. ACE inhibitors relax (dilate) the blood vessels and help to lower blood pressure. °· Angiotensin receptor blockers (ARBs). This type of medicine blocks the actions of a blood protein called angiotensin. ARBs dilate the blood vessels and help to lower blood pressure. °· Water   pills (diuretics). Diuretics cause the kidneys to remove salt and water from the blood. The extra fluid is removed through urination, leaving a lower volume of blood that the heart has to pump. °· Beta blockers. These improve heart  muscle strength and they prevent the heart from beating too quickly. °· Digoxin. This increases the force of the heartbeat. ° °Healthy behavior changes °These may include: °· Reaching and maintaining a healthy weight. °· Stopping smoking or chewing tobacco. °· Eating heart-healthy foods. °· Limiting or avoiding alcohol. °· Stopping use of street drugs (illegal drugs). °· Physical activity. ° °Other treatments °These may include: °· Surgery to open blocked coronary arteries or repair damaged heart valves. °· Placement of a biventricular pacemaker to improve heart muscle function (cardiac resynchronization therapy). This device paces both the right ventricle and left ventricle. °· Placement of a device to treat serious abnormal heart rhythms (implantable cardioverter defibrillator, or ICD). °· Placement of a device to improve the pumping ability of the heart (left ventricular assist device, or LVAD). °· Heart transplant. This can cure heart failure, and it is considered for certain patients who do not improve with other therapies. ° °Follow these instructions at home: °Medicines °· Take over-the-counter and prescription medicines only as told by your health care provider. Medicines are important in reducing the workload of your heart, slowing the progression of heart failure, and improving your symptoms. °? Do not stop taking your medicine unless your health care provider told you to do that. °? Do not skip any dose of medicine. °? Refill your prescriptions before you run out of medicine. You need your medicines every day. °Eating and drinking ° °· Eat heart-healthy foods. Talk with a dietitian to make an eating plan that is right for you. °? Choose foods that contain no trans fat and are low in saturated fat and cholesterol. Healthy choices include fresh or frozen fruits and vegetables, fish, lean meats, legumes, fat-free or low-fat dairy products, and whole-grain or high-fiber foods. °? Limit salt (sodium) if  directed by your health care provider. Sodium restriction may reduce symptoms of heart failure. Ask a dietitian to recommend heart-healthy seasonings. °? Use healthy cooking methods instead of frying. Healthy methods include roasting, grilling, broiling, baking, poaching, steaming, and stir-frying. °· Limit your fluid intake if directed by your health care provider. Fluid restriction may reduce symptoms of heart failure. °Lifestyle °· Stop smoking or using chewing tobacco. Nicotine and tobacco can damage your heart and your blood vessels. Do not use nicotine gum or patches before talking to your health care provider. °· Limit alcohol intake to no more than 1 drink per day for non-pregnant women and 2 drinks per day for men. One drink equals 12 oz of beer, 5 oz of wine, or 1½ oz of hard liquor. °? Drinking more than that is harmful to your heart. Tell your health care provider if you drink alcohol several times a week. °? Talk with your health care provider about whether any level of alcohol use is safe for you. °? If your heart has already been damaged by alcohol or you have severe heart failure, drinking alcohol should be stopped completely. °· Stop use of illegal drugs. °· Lose weight if directed by your health care provider. Weight loss may reduce symptoms of heart failure. °· Do moderate physical activity if directed by your health care provider. People who are elderly and people with severe heart failure should consult with a health care provider for physical activity recommendations. °  Monitor important information °· Weigh yourself every day. Keeping track of your weight daily helps you to notice excess fluid sooner. °? Weigh yourself every morning after you urinate and before you eat breakfast. °? Wear the same amount of clothing each time you weigh yourself. °? Record your daily weight. Provide your health care provider with your weight record. °· Monitor and record your blood pressure as told by your health  care provider. °· Check your pulse as told by your health care provider. °Dealing with extreme temperatures °· If the weather is extremely hot: °? Avoid vigorous physical activity. °? Use air conditioning or fans or seek a cooler location. °? Avoid caffeine and alcohol. °? Wear loose-fitting, lightweight, and light-colored clothing. °· If the weather is extremely cold: °? Avoid vigorous physical activity. °? Layer your clothes. °? Wear mittens or gloves, a hat, and a scarf when you go outside. °? Avoid alcohol. °General instructions °· Manage other health conditions such as hypertension, diabetes, thyroid disease, or abnormal heart rhythms as told by your health care provider. °· Learn to manage stress. If you need help to do this, ask your health care provider. °· Plan rest periods when fatigued. °· Get ongoing education and support as needed. °· Participate in or seek rehabilitation as needed to maintain or improve independence and quality of life. °· Stay up to date with immunizations. Keeping current on pneumococcal and influenza immunizations is especially important to prevent respiratory infections. °· Keep all follow-up visits as told by your health care provider. This is important. °Contact a health care provider if: °· You have a rapid weight gain. °· You have increasing shortness of breath that is unusual for you. °· You are unable to participate in your usual physical activities. °· You tire easily. °· You cough more than normal, especially with physical activity. °· You have any swelling or more swelling in areas such as your hands, feet, ankles, or abdomen. °· You are unable to sleep because it is hard to breathe. °· You feel like your heart is beating quickly (palpitations). °· You become dizzy or light-headed when you stand up. °Get help right away if: °· You have difficulty breathing. °· You notice or your family notices a change in your awareness, such as having trouble staying awake or having  difficulty with concentration. °· You have pain or discomfort in your chest. °· You have an episode of fainting (syncope). °This information is not intended to replace advice given to you by your health care provider. Make sure you discuss any questions you have with your health care provider. °Document Released: 11/24/2005 Document Revised: 07/29/2016 Document Reviewed: 06/18/2016 °Elsevier Interactive Patient Education © 2017 [employer] ° °
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
## 2017-10-05 DIAGNOSIS — I509 Heart failure, unspecified: Secondary | ICD-10-CM | POA: Diagnosis not present

## 2017-10-05 DIAGNOSIS — J449 Chronic obstructive pulmonary disease, unspecified: Secondary | ICD-10-CM | POA: Diagnosis not present

## 2017-10-05 DIAGNOSIS — R0902 Hypoxemia: Secondary | ICD-10-CM | POA: Diagnosis not present

## 2017-10-11 ENCOUNTER — Encounter (HOSPITAL_COMMUNITY): Payer: Self-pay | Admitting: Emergency Medicine

## 2017-10-11 ENCOUNTER — Inpatient Hospital Stay (HOSPITAL_COMMUNITY)
Admission: EM | Admit: 2017-10-11 | Discharge: 2017-10-16 | DRG: 291 | Disposition: A | Discharge status: Hospice | Payer: Medicare HMO | Attending: Internal Medicine | Admitting: Internal Medicine

## 2017-10-11 ENCOUNTER — Emergency Department (HOSPITAL_COMMUNITY): Payer: Medicare HMO

## 2017-10-11 ENCOUNTER — Other Ambulatory Visit: Payer: Self-pay

## 2017-10-11 DIAGNOSIS — I130 Hypertensive heart and chronic kidney disease with heart failure and stage 1 through stage 4 chronic kidney disease, or unspecified chronic kidney disease: Secondary | ICD-10-CM | POA: Diagnosis not present

## 2017-10-11 DIAGNOSIS — G40909 Epilepsy, unspecified, not intractable, without status epilepticus: Secondary | ICD-10-CM | POA: Diagnosis present

## 2017-10-11 DIAGNOSIS — R630 Anorexia: Secondary | ICD-10-CM | POA: Diagnosis present

## 2017-10-11 DIAGNOSIS — I10 Essential (primary) hypertension: Secondary | ICD-10-CM | POA: Diagnosis not present

## 2017-10-11 DIAGNOSIS — I509 Heart failure, unspecified: Secondary | ICD-10-CM | POA: Diagnosis not present

## 2017-10-11 DIAGNOSIS — E785 Hyperlipidemia, unspecified: Secondary | ICD-10-CM | POA: Diagnosis present

## 2017-10-11 DIAGNOSIS — D649 Anemia, unspecified: Secondary | ICD-10-CM | POA: Diagnosis present

## 2017-10-11 DIAGNOSIS — N184 Chronic kidney disease, stage 4 (severe): Secondary | ICD-10-CM | POA: Diagnosis present

## 2017-10-11 DIAGNOSIS — I4510 Unspecified right bundle-branch block: Secondary | ICD-10-CM | POA: Diagnosis present

## 2017-10-11 DIAGNOSIS — Z7951 Long term (current) use of inhaled steroids: Secondary | ICD-10-CM

## 2017-10-11 DIAGNOSIS — R778 Other specified abnormalities of plasma proteins: Secondary | ICD-10-CM

## 2017-10-11 DIAGNOSIS — I248 Other forms of acute ischemic heart disease: Secondary | ICD-10-CM | POA: Diagnosis present

## 2017-10-11 DIAGNOSIS — E669 Obesity, unspecified: Secondary | ICD-10-CM | POA: Diagnosis present

## 2017-10-11 DIAGNOSIS — I080 Rheumatic disorders of both mitral and aortic valves: Secondary | ICD-10-CM | POA: Diagnosis present

## 2017-10-11 DIAGNOSIS — Z85528 Personal history of other malignant neoplasm of kidney: Secondary | ICD-10-CM

## 2017-10-11 DIAGNOSIS — R079 Chest pain, unspecified: Secondary | ICD-10-CM | POA: Diagnosis present

## 2017-10-11 DIAGNOSIS — Z66 Do not resuscitate: Secondary | ICD-10-CM | POA: Diagnosis present

## 2017-10-11 DIAGNOSIS — Z7189 Other specified counseling: Secondary | ICD-10-CM

## 2017-10-11 DIAGNOSIS — R748 Abnormal levels of other serum enzymes: Secondary | ICD-10-CM | POA: Diagnosis present

## 2017-10-11 DIAGNOSIS — Z7952 Long term (current) use of systemic steroids: Secondary | ICD-10-CM

## 2017-10-11 DIAGNOSIS — G4730 Sleep apnea, unspecified: Secondary | ICD-10-CM | POA: Diagnosis present

## 2017-10-11 DIAGNOSIS — Z9111 Patient's noncompliance with dietary regimen: Secondary | ICD-10-CM

## 2017-10-11 DIAGNOSIS — Z515 Encounter for palliative care: Secondary | ICD-10-CM | POA: Diagnosis present

## 2017-10-11 DIAGNOSIS — I2510 Atherosclerotic heart disease of native coronary artery without angina pectoris: Secondary | ICD-10-CM | POA: Diagnosis present

## 2017-10-11 DIAGNOSIS — I110 Hypertensive heart disease with heart failure: Secondary | ICD-10-CM | POA: Diagnosis not present

## 2017-10-11 DIAGNOSIS — R0789 Other chest pain: Secondary | ICD-10-CM | POA: Diagnosis not present

## 2017-10-11 DIAGNOSIS — J9610 Chronic respiratory failure, unspecified whether with hypoxia or hypercapnia: Secondary | ICD-10-CM | POA: Diagnosis not present

## 2017-10-11 DIAGNOSIS — Z87891 Personal history of nicotine dependence: Secondary | ICD-10-CM

## 2017-10-11 DIAGNOSIS — E1122 Type 2 diabetes mellitus with diabetic chronic kidney disease: Secondary | ICD-10-CM | POA: Diagnosis present

## 2017-10-11 DIAGNOSIS — Z79899 Other long term (current) drug therapy: Secondary | ICD-10-CM

## 2017-10-11 DIAGNOSIS — E232 Diabetes insipidus: Secondary | ICD-10-CM | POA: Diagnosis not present

## 2017-10-11 DIAGNOSIS — I5084 End stage heart failure: Secondary | ICD-10-CM | POA: Diagnosis present

## 2017-10-11 DIAGNOSIS — Z8249 Family history of ischemic heart disease and other diseases of the circulatory system: Secondary | ICD-10-CM | POA: Diagnosis not present

## 2017-10-11 DIAGNOSIS — R7989 Other specified abnormal findings of blood chemistry: Secondary | ICD-10-CM

## 2017-10-11 DIAGNOSIS — Z79890 Hormone replacement therapy (postmenopausal): Secondary | ICD-10-CM

## 2017-10-11 DIAGNOSIS — Z905 Acquired absence of kidney: Secondary | ICD-10-CM

## 2017-10-11 DIAGNOSIS — E7800 Pure hypercholesterolemia, unspecified: Secondary | ICD-10-CM | POA: Diagnosis not present

## 2017-10-11 DIAGNOSIS — I440 Atrioventricular block, first degree: Secondary | ICD-10-CM | POA: Diagnosis present

## 2017-10-11 DIAGNOSIS — R279 Unspecified lack of coordination: Secondary | ICD-10-CM | POA: Diagnosis not present

## 2017-10-11 DIAGNOSIS — I272 Pulmonary hypertension, unspecified: Secondary | ICD-10-CM | POA: Diagnosis present

## 2017-10-11 DIAGNOSIS — I351 Nonrheumatic aortic (valve) insufficiency: Secondary | ICD-10-CM

## 2017-10-11 DIAGNOSIS — R072 Precordial pain: Secondary | ICD-10-CM | POA: Diagnosis not present

## 2017-10-11 DIAGNOSIS — I452 Bifascicular block: Secondary | ICD-10-CM | POA: Diagnosis present

## 2017-10-11 DIAGNOSIS — I878 Other specified disorders of veins: Secondary | ICD-10-CM | POA: Diagnosis present

## 2017-10-11 DIAGNOSIS — J42 Unspecified chronic bronchitis: Secondary | ICD-10-CM | POA: Diagnosis not present

## 2017-10-11 DIAGNOSIS — I5043 Acute on chronic combined systolic (congestive) and diastolic (congestive) heart failure: Secondary | ICD-10-CM | POA: Diagnosis present

## 2017-10-11 DIAGNOSIS — J449 Chronic obstructive pulmonary disease, unspecified: Secondary | ICD-10-CM | POA: Diagnosis present

## 2017-10-11 DIAGNOSIS — Z9981 Dependence on supplemental oxygen: Secondary | ICD-10-CM

## 2017-10-11 DIAGNOSIS — E039 Hypothyroidism, unspecified: Secondary | ICD-10-CM | POA: Diagnosis present

## 2017-10-11 DIAGNOSIS — Z743 Need for continuous supervision: Secondary | ICD-10-CM | POA: Diagnosis not present

## 2017-10-11 DIAGNOSIS — E119 Type 2 diabetes mellitus without complications: Secondary | ICD-10-CM

## 2017-10-11 DIAGNOSIS — Z6833 Body mass index (BMI) 33.0-33.9, adult: Secondary | ICD-10-CM

## 2017-10-11 LAB — CBC
HEMATOCRIT: 31.1 % — AB (ref 39.0–52.0)
HEMOGLOBIN: 9.6 g/dL — AB (ref 13.0–17.0)
MCH: 29.9 pg (ref 26.0–34.0)
MCHC: 30.9 g/dL (ref 30.0–36.0)
MCV: 96.9 fL (ref 78.0–100.0)
Platelets: 134 10*3/uL — ABNORMAL LOW (ref 150–400)
RBC: 3.21 MIL/uL — ABNORMAL LOW (ref 4.22–5.81)
RDW: 16.6 % — ABNORMAL HIGH (ref 11.5–15.5)
WBC: 5.4 10*3/uL (ref 4.0–10.5)

## 2017-10-11 LAB — BASIC METABOLIC PANEL
ANION GAP: 12 (ref 5–15)
BUN: 58 mg/dL — AB (ref 6–20)
CHLORIDE: 111 mmol/L (ref 101–111)
CO2: 24 mmol/L (ref 22–32)
Calcium: 8.7 mg/dL — ABNORMAL LOW (ref 8.9–10.3)
Creatinine, Ser: 2.82 mg/dL — ABNORMAL HIGH (ref 0.61–1.24)
GFR calc Af Amer: 24 mL/min — ABNORMAL LOW (ref >60)
GFR, EST NON AFRICAN AMERICAN: 21 mL/min — AB (ref >60)
GLUCOSE: 89 mg/dL (ref 65–99)
POTASSIUM: 4 mmol/L (ref 3.5–5.1)
Sodium: 147 mmol/L — ABNORMAL HIGH (ref 135–145)

## 2017-10-11 LAB — HEPATIC FUNCTION PANEL
ALBUMIN: 3.5 g/dL (ref 3.5–5.0)
ALK PHOS: 62 U/L (ref 38–126)
ALT: 48 U/L (ref 17–63)
AST: 44 U/L — ABNORMAL HIGH (ref 15–41)
BILIRUBIN TOTAL: 0.6 mg/dL (ref 0.3–1.2)
Bilirubin, Direct: 0.2 mg/dL (ref 0.1–0.5)
Indirect Bilirubin: 0.4 mg/dL (ref 0.3–0.9)
TOTAL PROTEIN: 6.5 g/dL (ref 6.5–8.1)

## 2017-10-11 LAB — TROPONIN I: Troponin I: 0.03 ng/mL (ref <0.03)

## 2017-10-11 LAB — PHENYTOIN LEVEL, TOTAL: Phenytoin Lvl: 11.9 ug/mL (ref 10.0–20.0)

## 2017-10-11 LAB — MAGNESIUM: Magnesium: 2.3 mg/dL (ref 1.7–2.4)

## 2017-10-11 LAB — GLUCOSE, CAPILLARY: Glucose-Capillary: 79 mg/dL (ref 65–99)

## 2017-10-11 LAB — BRAIN NATRIURETIC PEPTIDE: B Natriuretic Peptide: 2661 pg/mL — ABNORMAL HIGH (ref 0.0–100.0)

## 2017-10-11 MED ORDER — POTASSIUM CHLORIDE CRYS ER 20 MEQ PO TBCR
20.0000 meq | EXTENDED_RELEASE_TABLET | Freq: Every day | ORAL | Status: DC
  Administered 2017-10-12 – 2017-10-16 (×5): 20 meq via ORAL
  Filled 2017-10-11 (×6): qty 1

## 2017-10-11 MED ORDER — TORSEMIDE 20 MG PO TABS
60.0000 mg | ORAL_TABLET | Freq: Two times a day (BID) | ORAL | Status: DC
  Administered 2017-10-12: 60 mg via ORAL
  Filled 2017-10-11: qty 3

## 2017-10-11 MED ORDER — MOMETASONE FURO-FORMOTEROL FUM 200-5 MCG/ACT IN AERO
2.0000 | INHALATION_SPRAY | Freq: Two times a day (BID) | RESPIRATORY_TRACT | Status: DC
  Administered 2017-10-12 – 2017-10-16 (×8): 2 via RESPIRATORY_TRACT
  Filled 2017-10-11 (×2): qty 8.8

## 2017-10-11 MED ORDER — SIMVASTATIN 20 MG PO TABS
20.0000 mg | ORAL_TABLET | Freq: Every day | ORAL | Status: DC
  Administered 2017-10-12 – 2017-10-15 (×4): 20 mg via ORAL
  Filled 2017-10-11 (×4): qty 1

## 2017-10-11 MED ORDER — ONDANSETRON HCL 4 MG/2ML IJ SOLN: 4.0000 mg | Freq: Four times a day (QID) | INTRAMUSCULAR | Status: DC | PRN

## 2017-10-11 MED ORDER — DESMOPRESSIN ACETATE 0.2 MG PO TABS
0.2000 mg | ORAL_TABLET | Freq: Two times a day (BID) | ORAL | Status: DC
  Administered 2017-10-12 – 2017-10-16 (×10): 0.2 mg via ORAL
  Filled 2017-10-11 (×17): qty 1

## 2017-10-11 MED ORDER — IPRATROPIUM-ALBUTEROL 0.5-2.5 (3) MG/3ML IN SOLN
3.0000 mL | Freq: Once | RESPIRATORY_TRACT | Status: AC
  Administered 2017-10-11: 3 mL via RESPIRATORY_TRACT
  Filled 2017-10-11: qty 3

## 2017-10-11 MED ORDER — FUROSEMIDE 10 MG/ML IJ SOLN
40.0000 mg | Freq: Once | INTRAMUSCULAR | Status: AC
  Administered 2017-10-11: 40 mg via INTRAMUSCULAR
  Filled 2017-10-11: qty 4

## 2017-10-11 MED ORDER — TIOTROPIUM BROMIDE MONOHYDRATE 18 MCG IN CAPS
1.0000 | ORAL_CAPSULE | Freq: Every day | RESPIRATORY_TRACT | Status: DC
  Administered 2017-10-12 – 2017-10-16 (×5): 18 ug via RESPIRATORY_TRACT
  Filled 2017-10-11: qty 5

## 2017-10-11 MED ORDER — ACETAMINOPHEN 325 MG PO TABS
650.0000 mg | ORAL_TABLET | ORAL | Status: DC | PRN
  Administered 2017-10-14: 650 mg via ORAL
  Filled 2017-10-11: qty 2

## 2017-10-11 MED ORDER — DIPHENOXYLATE-ATROPINE 2.5-0.025 MG PO TABS: 1.0000 | ORAL_TABLET | Freq: Four times a day (QID) | ORAL | Status: DC | PRN

## 2017-10-11 MED ORDER — ALBUTEROL SULFATE (2.5 MG/3ML) 0.083% IN NEBU
2.5000 mg | INHALATION_SOLUTION | RESPIRATORY_TRACT | Status: DC | PRN
  Administered 2017-10-12 – 2017-10-14 (×3): 2.5 mg via RESPIRATORY_TRACT
  Filled 2017-10-11 (×3): qty 3

## 2017-10-11 MED ORDER — NITROGLYCERIN 0.4 MG SL SUBL
0.4000 mg | SUBLINGUAL_TABLET | SUBLINGUAL | Status: DC | PRN
  Administered 2017-10-11 (×2): 0.4 mg via SUBLINGUAL
  Filled 2017-10-11: qty 1

## 2017-10-11 MED ORDER — AMLODIPINE BESYLATE 5 MG PO TABS
10.0000 mg | ORAL_TABLET | Freq: Every day | ORAL | Status: DC
  Administered 2017-10-12 – 2017-10-16 (×5): 10 mg via ORAL
  Filled 2017-10-11 (×5): qty 2

## 2017-10-11 MED ORDER — ENOXAPARIN SODIUM 40 MG/0.4ML ~~LOC~~ SOLN
40.0000 mg | SUBCUTANEOUS | Status: DC
  Administered 2017-10-11 – 2017-10-15 (×5): 40 mg via SUBCUTANEOUS
  Filled 2017-10-11 (×5): qty 0.4

## 2017-10-11 MED ORDER — COLCHICINE 0.6 MG PO TABS
0.6000 mg | ORAL_TABLET | Freq: Every day | ORAL | Status: DC
  Administered 2017-10-12 – 2017-10-16 (×5): 0.6 mg via ORAL
  Filled 2017-10-11 (×5): qty 1

## 2017-10-11 MED ORDER — METHYLPREDNISOLONE SODIUM SUCC 125 MG IJ SOLR
125.0000 mg | Freq: Once | INTRAMUSCULAR | Status: AC
  Administered 2017-10-11: 125 mg via INTRAVENOUS
  Filled 2017-10-11: qty 2

## 2017-10-11 MED ORDER — DEXAMETHASONE 0.5 MG PO TABS
1.5000 mg | ORAL_TABLET | Freq: Every day | ORAL | Status: DC
  Administered 2017-10-12 – 2017-10-16 (×5): 1.5 mg via ORAL
  Filled 2017-10-11: qty 1
  Filled 2017-10-11 (×7): qty 3

## 2017-10-11 MED ORDER — CALCITRIOL 0.25 MCG PO CAPS
0.5000 ug | ORAL_CAPSULE | Freq: Every day | ORAL | Status: DC
  Administered 2017-10-12 – 2017-10-16 (×5): 0.5 ug via ORAL
  Filled 2017-10-11 (×5): qty 2

## 2017-10-11 MED ORDER — GUAIFENESIN ER 600 MG PO TB12
600.0000 mg | ORAL_TABLET | Freq: Two times a day (BID) | ORAL | Status: DC
  Administered 2017-10-12 – 2017-10-16 (×10): 600 mg via ORAL
  Filled 2017-10-11 (×10): qty 1

## 2017-10-11 MED ORDER — ISOSORB DINITRATE-HYDRALAZINE 20-37.5 MG PO TABS
1.0000 | ORAL_TABLET | Freq: Three times a day (TID) | ORAL | Status: DC
  Administered 2017-10-12 – 2017-10-16 (×13): 1 via ORAL
  Filled 2017-10-11 (×24): qty 1

## 2017-10-11 MED ORDER — LEVETIRACETAM 250 MG PO TABS
250.0000 mg | ORAL_TABLET | Freq: Two times a day (BID) | ORAL | Status: DC
  Administered 2017-10-12 – 2017-10-16 (×10): 250 mg via ORAL
  Filled 2017-10-11 (×10): qty 1

## 2017-10-11 MED ORDER — LEVOTHYROXINE SODIUM 25 MCG PO TABS
125.0000 ug | ORAL_TABLET | Freq: Every day | ORAL | Status: DC
  Administered 2017-10-12 – 2017-10-16 (×5): 125 ug via ORAL
  Filled 2017-10-11 (×5): qty 1

## 2017-10-11 MED ORDER — CLONIDINE HCL 0.2 MG PO TABS
0.2000 mg | ORAL_TABLET | Freq: Three times a day (TID) | ORAL | Status: DC
  Administered 2017-10-12 – 2017-10-16 (×14): 0.2 mg via ORAL
  Filled 2017-10-11 (×14): qty 1

## 2017-10-11 MED ORDER — ASPIRIN EC 81 MG PO TBEC
81.0000 mg | DELAYED_RELEASE_TABLET | Freq: Every day | ORAL | Status: DC
  Administered 2017-10-12 – 2017-10-16 (×5): 81 mg via ORAL
  Filled 2017-10-11 (×5): qty 1

## 2017-10-11 MED ORDER — TAMSULOSIN HCL 0.4 MG PO CAPS
0.4000 mg | ORAL_CAPSULE | Freq: Every day | ORAL | Status: DC
Start: 2017-10-12 — End: 2017-10-16
  Administered 2017-10-12 – 2017-10-15 (×4): 0.4 mg via ORAL
  Filled 2017-10-11 (×4): qty 1

## 2017-10-11 MED ORDER — PHENYTOIN SODIUM EXTENDED 100 MG PO CAPS
100.0000 mg | ORAL_CAPSULE | Freq: Three times a day (TID) | ORAL | Status: DC
  Administered 2017-10-12 – 2017-10-16 (×14): 100 mg via ORAL
  Filled 2017-10-11 (×14): qty 1
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
## 2017-10-11 NOTE — ED Notes (Signed)
CRITICAL VALUE ALERT  Critical Value:  Troponin 0.03 mg/dl  Date & Time Notied:  10/11/17 1930 hr  Provider Notified: Dr. Jeanell Sparrow  Orders Received/Actions taken: N/A

## 2017-10-11 NOTE — ED Triage Notes (Addendum)
Patient c/o mid-sternal chest pain that radiates into left shoulder. Per patient pain started yesterday-unable to describe. Patient states shortness of breath and dizziness. Hx of MI, CHF, and leaking valve. Patient does state swelling in lower extremities bilaterally. Patient reports wearing 2 liters of oxygen via [place].

## 2017-10-12 ENCOUNTER — Encounter (HOSPITAL_COMMUNITY): Payer: Self-pay | Admitting: Primary Care

## 2017-10-12 DIAGNOSIS — I351 Nonrheumatic aortic (valve) insufficiency: Secondary | ICD-10-CM

## 2017-10-12 DIAGNOSIS — G40909 Epilepsy, unspecified, not intractable, without status epilepticus: Secondary | ICD-10-CM

## 2017-10-12 DIAGNOSIS — E232 Diabetes insipidus: Secondary | ICD-10-CM

## 2017-10-12 DIAGNOSIS — I5043 Acute on chronic combined systolic (congestive) and diastolic (congestive) heart failure: Secondary | ICD-10-CM

## 2017-10-12 DIAGNOSIS — E7800 Pure hypercholesterolemia, unspecified: Secondary | ICD-10-CM

## 2017-10-12 DIAGNOSIS — N184 Chronic kidney disease, stage 4 (severe): Secondary | ICD-10-CM | POA: Diagnosis not present

## 2017-10-12 DIAGNOSIS — I10 Essential (primary) hypertension: Secondary | ICD-10-CM

## 2017-10-12 DIAGNOSIS — R0789 Other chest pain: Secondary | ICD-10-CM | POA: Diagnosis not present

## 2017-10-12 DIAGNOSIS — E039 Hypothyroidism, unspecified: Secondary | ICD-10-CM

## 2017-10-12 DIAGNOSIS — J42 Unspecified chronic bronchitis: Secondary | ICD-10-CM

## 2017-10-12 DIAGNOSIS — I509 Heart failure, unspecified: Secondary | ICD-10-CM | POA: Diagnosis not present

## 2017-10-12 LAB — BASIC METABOLIC PANEL WITH GFR
Anion gap: 12 (ref 5–15)
BUN: 60 mg/dL — ABNORMAL HIGH (ref 6–20)
CO2: 23 mmol/L (ref 22–32)
Calcium: 8.6 mg/dL — ABNORMAL LOW (ref 8.9–10.3)
Chloride: 107 mmol/L (ref 101–111)
Creatinine, Ser: 2.79 mg/dL — ABNORMAL HIGH (ref 0.61–1.24)
GFR calc Af Amer: 25 mL/min — ABNORMAL LOW
GFR calc non Af Amer: 21 mL/min — ABNORMAL LOW
Glucose, Bld: 118 mg/dL — ABNORMAL HIGH (ref 65–99)
Potassium: 4.4 mmol/L (ref 3.5–5.1)
Sodium: 142 mmol/L (ref 135–145)

## 2017-10-12 LAB — GLUCOSE, CAPILLARY
GLUCOSE-CAPILLARY: 101 mg/dL — AB (ref 65–99)
Glucose-Capillary: 103 mg/dL — ABNORMAL HIGH (ref 65–99)
Glucose-Capillary: 92 mg/dL (ref 65–99)
Glucose-Capillary: 96 mg/dL (ref 65–99)

## 2017-10-12 LAB — TROPONIN I
Troponin I: 0.04 ng/mL (ref <0.03)
Troponin I: 0.03 ng/mL (ref <0.03)

## 2017-10-12 LAB — CBC
HCT: 31.2 % — ABNORMAL LOW (ref 39.0–52.0)
Hemoglobin: 9.7 g/dL — ABNORMAL LOW (ref 13.0–17.0)
MCH: 29.9 pg (ref 26.0–34.0)
MCHC: 31.1 g/dL (ref 30.0–36.0)
MCV: 96.3 fL (ref 78.0–100.0)
Platelets: 143 K/uL — ABNORMAL LOW (ref 150–400)
RBC: 3.24 MIL/uL — ABNORMAL LOW (ref 4.22–5.81)
RDW: 16.6 % — ABNORMAL HIGH (ref 11.5–15.5)
WBC: 4.6 K/uL (ref 4.0–10.5)

## 2017-10-12 MED ORDER — ORAL CARE MOUTH RINSE
15.0000 mL | Freq: Two times a day (BID) | OROMUCOSAL | Status: DC
  Administered 2017-10-12 – 2017-10-16 (×7): 15 mL via OROMUCOSAL

## 2017-10-12 MED ORDER — FUROSEMIDE 10 MG/ML IJ SOLN
60.0000 mg | Freq: Two times a day (BID) | INTRAMUSCULAR | Status: DC
  Administered 2017-10-12 – 2017-10-13 (×2): 60 mg via INTRAVENOUS
  Filled 2017-10-12 (×2): qty 6

## 2017-10-12 MED ORDER — ALBUTEROL SULFATE (2.5 MG/3ML) 0.083% IN NEBU
2.5000 mg | INHALATION_SOLUTION | Freq: Three times a day (TID) | RESPIRATORY_TRACT | Status: DC
  Administered 2017-10-12 – 2017-10-16 (×12): 2.5 mg via RESPIRATORY_TRACT
  Filled 2017-10-12 (×12): qty 3

## 2017-10-12 NOTE — Care Management Note (Signed)
Case Management Note  Patient Details  Name: [name] [name] MRN: [id number] Date of Birth: [birth year]  Subjective/Objective:           Admitted with CHF. This is pt's 8th admission is 6 months. He lives at home alone, his dtr is his support person. He has home oxygen and is ind with ADL'S. He has PCP and cardiologist, says he has not been to any appointments since DC. He says he did not have all the fluid off when DC'd last time. He reports weighing himself daily and following his diet. He also closes his eyes as he talks to me. He asks the secretary for a cup of ice as I'm in the room.  Says he only drinks water at home, his ice machine is broke. He says he is tired of being in the hospital. When asked about [facility] referral to help keep him out of hospital he says he's had them before and they dont help, same with [facility]. Pt wishes to cont treatment of his COPD/CHF.          Action/Plan: DC home with self care. Pt educated that ice counts as fluid and needs to be restricted too. Referred to dietician for enforcement of education. CM will discuss palliative consult with MD for education on disease progression. CM will follow to DC. Encouraged pt to voice any concerns he may have about his transition home so that we can address them.  Expected Discharge Date:     10/15/2017             Expected Discharge Plan:  Home/Self Care  In-House Referral:  Nutrition  Discharge planning Services  CM Consult  Post Acute Care Choice:  NA Choice offered to:  NA  Status of Service:  Completed, signed off   Sherald Barge, RN 10/12/2017, 2:37 PM

## 2017-10-12 NOTE — Progress Notes (Signed)
PROGRESS NOTE    [name] [name]  WUJ:[id number] DOB: [birth year] DOA: 10/11/2017 PCP: [name], MD    Brief Narrative:  [name] [name] is a 71 y.o. male with medical history significant of aortic insufficiency, condylar esophagitis in 2016, history of lower extremity cellulitis, COPD, chronic respiratory failure, chronic systolic CHF, stage IV chronic kidney disease, history of right nephrectomy for hypernephroma in 2010, type 2 diabetes, history of GI bleeding April 2016, hyperlipidemia, hypertension, hypothyroidism, obesity, pan hypopituitarism, pulmonary hypertension, RBBB, seizure disorder after creatinine ultimately, sleep apnea, not on CPAP, history of tobacco abuse (ceased in 1998), recently admitted and discharged on 09/22/2017 for COPD exacerbation who is coming to the emergency department with complaints of progressively worsening dyspnea associated with dizziness for about a week and 2 episodes of exertional sternal chest pain radiated to his left shoulder since yesterday relieved by sublingual nitroglycerin associated with mostly dry cough, wheezing, worsening dyspnea, worsening lower extremities edema and dizziness.  He denies fever, chills, sore throat, abdominal pain, nausea, emesis, constipation, melena or hematochezia.  No dysuria, frequency or hematuria.  Denies polyuria, polydipsia, polyphagia or blurred vision.  ED Course: Initial vital signs in the emergency department temperature 97.36F, pulse 89, blood pressure 131/83 mmHg, respirations 22 and O2 sat 98% on nasal cannula oxygen.  He received furosemide 40 mg IM and sublingual nitroglycerin in the ED.  I also ordered Solu-Medrol 125 mg IVP x1 and a DuoNeb treatment.  His workup shows an EKG with sinus rhythm, APC, borderline prolonged PR, RBBB and LVH without significant changes from previous tracing.  Troponin level was 0.03 ng/mL.  BNP was 2661 pg/mL.  WBC was 5.4, hemoglobin 9.6 g/dL and platelets 134.  CMP shows a sodium  147, potassium 4.0, chloride 111 and bicarbonate 24 mmol/L.  BUN was 58, creatinine 2.82, glucose 89 and magnesium 2.3 mg/dL.  His LFTs show an AST of 44 u/L, but all other values were unremarkable.  Phenytoin level was 11.9 mcg/mL.  His chest radiograph showed cardiomegaly and mild diffuse pulmonary edema with suspected bilateral pleural effusions.  Please see images in full radiology report for further detail.  Cardiology consulted for further management of heart failure given repeated admissions.   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 Continue supplemental oxygen. Continue nitroglycerin sublingually as needed. Monitor and trend troponin level. Continue aspirin, amlodipine and BiDil Cardiology following   Acute on chronic combined systolic and diastolic CHF (congestive heart failure) (HCC) Continue supplemental oxygen. He received furosemide 40 mg IVP in the emergency department. Continue BiDil 20-37.5 mg p.o. 3 times daily. Follow serial daily BMP Cardiology consulted Lasix 60mg  BID Agree with cardiology suggestion of palliative care consult: this is patient's 8th admission in 6 months  Hypothyroid levothyroxine 125 mcg p.o. daily.   Hyperlipidemia simvastatin 20 mg p.o. daily.   Seizure disorder (HCC) Continue Keppra 250 mg p.o. twice daily. Continue phenytoin ER 100 mg p.o. 3 times daily. Phenytoin level was therapeutic at 11.9 mcg/mL.  Diabetes mellitus, type 2 (HCC) Carbohydrate modified diet. CBG monitoring  before meals  and at bedtime.  Essential hypertension amlodipine 10 mg p.o. daily. clonidine 0.2 mg p.o. 3 times daily. BiDil 3 times daily. Monitor blood pressure.  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 twice daily. Continue Decadron 1.5 mg p.o. daily. Continue testosterone injections as scheduled.  COPD (chronic obstructive pulmonary disease) (HCC) Supplemental oxygen. Solu-Medrol 125 mg IVP x1 given. Continue Advair Diskus or formulary equivalent. Continue Spiriva inhaler once a day. Nebulized bronchodilators as needed.  CKD (chronic kidney disease) stage 4, GFR 15-29 ml/min (HCC) Creatinine in the past 3 months has ranged from 2.4 to 2.92. Serial daily BMP   DVT prophylaxis: Lovenox Code Status: Full code Family Communication: no family bedside; called patient's daughter Disposition Plan: pending further diuresis   Consultants:   Cardiology  Procedures:   None  Antimicrobials:   None    Subjective: Patient seen this am.  States his legs are more swollen than normal.  Also reports his abdomen is swollen as well.  Chest pain has resolved.  Objective: Vitals:   10/12/17 0200 10/12/17 0500 10/12/17 0941 10/12/17 0950  BP: (!) 128/55 125/60    Pulse: 89 90    Resp: 18 16    Temp: 97.9 F (36.6 C) 98.5 F (36.9 C)    TempSrc: Oral Oral    SpO2: 97% 99% 95% 95%  Weight:  113.5 kg (250 lb 3.2 oz)    Height:        Intake/Output Summary (Last 24 hours) at 10/12/2017 1330 Last data filed at 10/12/2017 0800 Gross per 24 hour  Intake 360 ml  Output 1500 ml  Net -1140 ml   Filed Weights   10/11/17 1805 10/11/17 2246 10/12/17 0500  Weight: 112.9 kg (249 lb) 114.1 kg (251 lb 9.6 oz) 113.5 kg (250 lb 3.2 oz)    Examination:  General exam: Appears calm and comfortable  Respiratory system: BiBasilar crackles, Respiratory effort normal, soft breath sounds Cardiovascular system: S1 & S2 heard, RRR.  II/VI systolic murmur, No JVD, rubs, gallops or clicks. No pedal edema. Gastrointestinal system: Abdomen is distended, soft and nontender. No organomegaly or masses felt. Normal bowel sounds heard. Central nervous system: Alert and oriented. No focal neurological deficits. Extremities: Symmetric 5 x 5 power. Skin: No rashes, lesions or ulcers Psychiatry: Mood & affect appropriate.     Data Reviewed: I have personally reviewed following labs and imaging studies  CBC: Recent Labs  Lab 10/11/17 1840 10/12/17 0701  WBC 5.4 4.6  HGB 9.6* 9.7*  HCT 31.1* 31.2*  MCV 96.9 96.3  PLT 134* 161*   Basic Metabolic Panel: Recent Labs  Lab 10/11/17 1840 10/12/17 0701  NA 147* 142  K 4.0 4.4  CL 111 107  CO2 24 23  GLUCOSE 89 118*  BUN 58* 60*  CREATININE 2.82* 2.79*  CALCIUM 8.7* 8.6*  MG 2.3  --    GFR: Estimated Creatinine Clearance: 31.6 mL/min (A) (by C-G formula based on SCr of 2.79 mg/dL (H)). Liver Function Tests: Recent Labs  Lab 10/11/17 1831  AST 44*  ALT 48  ALKPHOS 62  BILITOT 0.6  PROT 6.5  ALBUMIN 3.5   No results for input(s): LIPASE, AMYLASE in the last 168 hours. No results for input(s): AMMONIA in the last 168 hours. Coagulation Profile: No results for input(s): INR, PROTIME in the last 168 hours. Cardiac Enzymes: Recent Labs  Lab 10/11/17 1840 10/12/17 0048 10/12/17 0701  TROPONINI 0.03* 0.04* 0.03*   BNP (last 3 results)  No results for input(s): PROBNP in the last 8760 hours. HbA1C: No results for input(s): HGBA1C in the last 72 hours. CBG: Recent Labs  Lab 10/11/17 2248 10/12/17 0741 10/12/17 1113  GLUCAP 79 103* 92   Lipid Profile: No results for input(s): CHOL, HDL, LDLCALC, TRIG, CHOLHDL, LDLDIRECT in the last 72 hours. Thyroid Function Tests: No results for input(s): TSH, T4TOTAL, FREET4, T3FREE, THYROIDAB in the last 72 hours. Anemia Panel: No results for input(s): VITAMINB12, FOLATE, FERRITIN, TIBC, IRON, RETICCTPCT in the  last 72 hours. Sepsis Labs: No results for input(s): PROCALCITON, LATICACIDVEN in the last 168 hours.  No results found for this or any previous visit (from the past 240 hour(s)).       Radiology Studies: Dg Chest 2 View  Result Date: 10/11/2017 CLINICAL DATA:  Initial evaluation for acute mid chest pain. History of CHF, hypertension. EXAM: CHEST  2 VIEW COMPARISON:  Prior radiograph from 09/18/2017. FINDINGS: Advanced cardiomegaly, stable. Mediastinal silhouette within normal limits. Lungs normally inflated. Perihilar vascular congestion with diffuse interstitial prominence, suggesting mild pulmonary interstitial edema. Small bilateral pleural effusions are suspected. No definite focal infiltrates. No pneumothorax. No acute osseus abnormality. IMPRESSION: Cardiomegaly with mild diffuse pulmonary interstitial edema with suspected small bilateral pleural effusions. Findings suggestive of acute CHF exacerbation. Electronically Signed   By: Jeannine Boga M.D.   On: 10/11/2017 19:26        Scheduled Meds: . albuterol  2.5 mg Nebulization TID  . amLODipine  10 mg Oral Daily  . aspirin EC  81 mg Oral Daily  . calcitRIOL  0.5 mcg Oral Daily  . cloNIDine  0.2 mg Oral TID  . colchicine  0.6 mg Oral Daily  . desmopressin  0.2 mg Oral BID  . dexamethasone  1.5 mg Oral Daily  . enoxaparin (LOVENOX) injection  40 mg Subcutaneous Q24H  . furosemide  60 mg Intravenous BID  . guaiFENesin  600 mg Oral BID  . isosorbide-hydrALAZINE  1 tablet Oral TID  . levETIRAcetam  250 mg Oral BID  . levothyroxine  125 mcg Oral QAC breakfast  . mouth rinse  15 mL Mouth Rinse BID  . mometasone-formoterol  2 puff Inhalation BID  . phenytoin  100 mg Oral TID  . potassium chloride SA  20 mEq Oral Daily  . simvastatin  20 mg Oral q1800  . tamsulosin  0.4 mg Oral QPC supper  . tiotropium  1 capsule Inhalation Daily   Continuous Infusions:   LOS: 0 days    Time spent: 35 minutes    Loretha Stapler, MD [facility] Pager [phone]  If 7PM-7AM, please contact night-coverage [email or web address] Password [email or web address] 10/12/2017, 1:30 PM

## 2017-10-12 NOTE — Care Management Obs Status (Signed)
[place] NOTIFICATION   Patient Details  Name: [name] [name] MRN: [id number] Date of Birth: 05-Oct-1946   Medicare Observation Status Notification Given:  Yes    Sherald Barge, RN 10/12/2017, 2:33 PM
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
## 2017-10-12 NOTE — Plan of Care (Signed)
Mr. Ragsdale is sitting up in his [place] chair, he makes but does not keep eye contact.  There is no family present at bedside at this time.  Mr. Jeffries agrees for me to call his daughter, Chukwudi Ewen. We talked briefly about Mr. Francesco Runner health, and plan for a family meeting via phone tomorrow 11/6 at 1300. No charge

## 2017-10-12 NOTE — Progress Notes (Signed)
Approximately at 1330, pt was c/o having trouble voiding.  Nurse at bedside and assessed patient as he voided 600cc in urinal.  Bladder scan showed 0cc's in bladder.  Will continue to monitor.
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
## 2017-10-12 NOTE — Care Management Obs Status (Deleted)
[facility] NOTIFICATION   Patient Details  Name: [name] [name] MRN: [id number] Date of Birth: 03-20-1946   Medicare Observation Status Notification Given:  Yes    Sherald Barge, RN 10/12/2017, 2:33 PM

## 2017-10-13 DIAGNOSIS — E232 Diabetes insipidus: Secondary | ICD-10-CM | POA: Diagnosis present

## 2017-10-13 DIAGNOSIS — R0789 Other chest pain: Secondary | ICD-10-CM | POA: Diagnosis not present

## 2017-10-13 DIAGNOSIS — G40909 Epilepsy, unspecified, not intractable, without status epilepticus: Secondary | ICD-10-CM | POA: Diagnosis present

## 2017-10-13 DIAGNOSIS — Z515 Encounter for palliative care: Secondary | ICD-10-CM | POA: Diagnosis present

## 2017-10-13 DIAGNOSIS — I4510 Unspecified right bundle-branch block: Secondary | ICD-10-CM | POA: Diagnosis present

## 2017-10-13 DIAGNOSIS — I2510 Atherosclerotic heart disease of native coronary artery without angina pectoris: Secondary | ICD-10-CM | POA: Diagnosis present

## 2017-10-13 DIAGNOSIS — J449 Chronic obstructive pulmonary disease, unspecified: Secondary | ICD-10-CM | POA: Diagnosis present

## 2017-10-13 DIAGNOSIS — I351 Nonrheumatic aortic (valve) insufficiency: Secondary | ICD-10-CM | POA: Diagnosis not present

## 2017-10-13 DIAGNOSIS — I248 Other forms of acute ischemic heart disease: Secondary | ICD-10-CM | POA: Diagnosis present

## 2017-10-13 DIAGNOSIS — J9610 Chronic respiratory failure, unspecified whether with hypoxia or hypercapnia: Secondary | ICD-10-CM | POA: Diagnosis present

## 2017-10-13 DIAGNOSIS — N184 Chronic kidney disease, stage 4 (severe): Secondary | ICD-10-CM | POA: Diagnosis present

## 2017-10-13 DIAGNOSIS — Z79899 Other long term (current) drug therapy: Secondary | ICD-10-CM | POA: Diagnosis not present

## 2017-10-13 DIAGNOSIS — Z7189 Other specified counseling: Secondary | ICD-10-CM

## 2017-10-13 DIAGNOSIS — I130 Hypertensive heart and chronic kidney disease with heart failure and stage 1 through stage 4 chronic kidney disease, or unspecified chronic kidney disease: Secondary | ICD-10-CM | POA: Diagnosis present

## 2017-10-13 DIAGNOSIS — G4730 Sleep apnea, unspecified: Secondary | ICD-10-CM | POA: Diagnosis present

## 2017-10-13 DIAGNOSIS — I272 Pulmonary hypertension, unspecified: Secondary | ICD-10-CM | POA: Diagnosis present

## 2017-10-13 DIAGNOSIS — I5084 End stage heart failure: Secondary | ICD-10-CM | POA: Diagnosis present

## 2017-10-13 DIAGNOSIS — I509 Heart failure, unspecified: Secondary | ICD-10-CM

## 2017-10-13 DIAGNOSIS — Z8249 Family history of ischemic heart disease and other diseases of the circulatory system: Secondary | ICD-10-CM | POA: Diagnosis not present

## 2017-10-13 DIAGNOSIS — E7800 Pure hypercholesterolemia, unspecified: Secondary | ICD-10-CM | POA: Diagnosis present

## 2017-10-13 DIAGNOSIS — I452 Bifascicular block: Secondary | ICD-10-CM | POA: Diagnosis present

## 2017-10-13 DIAGNOSIS — E785 Hyperlipidemia, unspecified: Secondary | ICD-10-CM | POA: Diagnosis present

## 2017-10-13 DIAGNOSIS — R748 Abnormal levels of other serum enzymes: Secondary | ICD-10-CM | POA: Diagnosis present

## 2017-10-13 DIAGNOSIS — I440 Atrioventricular block, first degree: Secondary | ICD-10-CM | POA: Diagnosis present

## 2017-10-13 DIAGNOSIS — E039 Hypothyroidism, unspecified: Secondary | ICD-10-CM | POA: Diagnosis present

## 2017-10-13 DIAGNOSIS — Z66 Do not resuscitate: Secondary | ICD-10-CM | POA: Diagnosis present

## 2017-10-13 DIAGNOSIS — I5043 Acute on chronic combined systolic (congestive) and diastolic (congestive) heart failure: Secondary | ICD-10-CM | POA: Diagnosis present

## 2017-10-13 DIAGNOSIS — D649 Anemia, unspecified: Secondary | ICD-10-CM | POA: Diagnosis present

## 2017-10-13 DIAGNOSIS — E1122 Type 2 diabetes mellitus with diabetic chronic kidney disease: Secondary | ICD-10-CM | POA: Diagnosis present

## 2017-10-13 LAB — GLUCOSE, CAPILLARY
GLUCOSE-CAPILLARY: 109 mg/dL — AB (ref 65–99)
GLUCOSE-CAPILLARY: 90 mg/dL (ref 65–99)
Glucose-Capillary: 103 mg/dL — ABNORMAL HIGH (ref 65–99)
Glucose-Capillary: 106 mg/dL — ABNORMAL HIGH (ref 65–99)

## 2017-10-13 LAB — BASIC METABOLIC PANEL
Anion gap: 11 (ref 5–15)
BUN: 66 mg/dL — AB (ref 6–20)
CHLORIDE: 106 mmol/L (ref 101–111)
CO2: 23 mmol/L (ref 22–32)
CREATININE: 2.8 mg/dL — AB (ref 0.61–1.24)
Calcium: 8.6 mg/dL — ABNORMAL LOW (ref 8.9–10.3)
GFR calc Af Amer: 24 mL/min — ABNORMAL LOW (ref >60)
GFR, EST NON AFRICAN AMERICAN: 21 mL/min — AB (ref >60)
GLUCOSE: 103 mg/dL — AB (ref 65–99)
Potassium: 4.3 mmol/L (ref 3.5–5.1)
Sodium: 140 mmol/L (ref 135–145)

## 2017-10-13 MED ORDER — SODIUM CHLORIDE 0.9 % IV SOLN: 250.0000 mL | INTRAVENOUS | Status: DC | PRN

## 2017-10-13 MED ORDER — SODIUM CHLORIDE 0.9% FLUSH
3.0000 mL | INTRAVENOUS | Status: DC | PRN
  Administered 2017-10-13: 3 mL via INTRAVENOUS
  Filled 2017-10-13: qty 3

## 2017-10-13 MED ORDER — SODIUM CHLORIDE 0.9% FLUSH
3.0000 mL | Freq: Two times a day (BID) | INTRAVENOUS | Status: DC
  Administered 2017-10-13 – 2017-10-16 (×5): 3 mL via INTRAVENOUS

## 2017-10-13 MED ORDER — FUROSEMIDE 10 MG/ML IJ SOLN
8.0000 mg/h | INTRAVENOUS | Status: DC
  Administered 2017-10-13: 4 mg/h via INTRAVENOUS
  Administered 2017-10-15: 8 mg/h via INTRAVENOUS
  Filled 2017-10-13 (×5): qty 25

## 2017-10-13 NOTE — Consult Note (Signed)
Consultation Note Date: 10/13/2017   Patient Name: [name] [name]  DOB: Jun 17, 1946  MRN: [id number]  Age / Sex: 71 y.o., male  PCP: [name], MD Referring Physician: Eber Jones, MD  Reason for Consultation: Establishing goals of care and Psychosocial/spiritual support  HPI/Patient Profile: 71 y.o. male  with past medical history of aortic insufficiency, COPD, chronic respiratory failure, chronic combined heart failure, stage IV kidney disease, history of right nephrectomy, type 2 diabetes, high blood pressure and cholesterol, obesity, pulmonary hypertension, right bundle branch block, sleep apnea not on CPAP, admitted on 10/11/2017 with chest pain, acute on chronic heart failure.   Clinical Assessment and Goals of Care: Mr. Dibble is lying quietly in bed.  He greets me making but not keeping eye contact.  He keeps his eyes closed through most of our discussion.  We talked about his heart failure, returning to the hospital.  Mr. Glaze states that he feels more comfortable here in the hospital, where he can get her care.  He agrees for me to call his daughter.  Call to daughter [name].  She tells me that she is a CNA, and is experienced with heart failure.  She shares that her father has had heart failure for over 5 years.  We talked about sodium restriction, fluid restriction, even stress causing heart failure exacerbation.  We also talked about leaky aortic valve with no surgical intervention.  Malachy Mood our concern in that Mr. Allsbrook has been hospitalized 8 times in 6 months yet nothing changes.  [name] endorses that she states that she sees no changes, her father goes home for only a few weeks but then comes back to the hospital.    We talked about the concept of treat the treatable but no CPR, no intubation "allow a natural death".  Both [name] and [name] agree that this is a good choice for him.  Changes  made to chart.  We talked about the benefits of hospice at home for symptom management.  [name] states that her boyfriend's mother had hospice at home, and she is familiar with their services.  She states that her preference is for her father to have hospice at home, but that he sometimes does not like others in his home.  [name] states her choice is hospice of [facility], she requests that they call her to set up an appointment to discuss their services when her father returns home.    Considering the benefits of hospice care at home vs continuing to return to the hospital.  Requesting a face-to-face meeting with hospice of [facility] upon discharge.  Health care power of attorney NEXT OF KIN -Mr. Glenaire names his daughter, Mattheu Brodersen as his healthcare surrogate.   SUMMARY OF RECOMMENDATIONS   At this point, continue to treat the treatable but no extraordinary measures such as CPR or intubation. Considering the benefits of hospice care at home.  Requesting a face-to-face meeting with hospice of [facility] upon discharge.  Code Status/Advance Care Planning:  DNR  Symptom Management:   Per hospitalist, no additional needs at this time.  Palliative Prophylaxis:   Frequent Pain Assessment  Additional Recommendations (Limitations, Scope, Preferences):  Continue to treat the treatable, no CPR, no intubation.  Considering home hospice services.  Psycho-social/Spiritual:   Desire for further Chaplaincy support:no  Additional Recommendations: Caregiving  Support/Resources and Education on Hospice  Prognosis:   < 3 months, would not be surprising based on 8 hospitalizations in 6 months, end-stage heart disease, frailty, functional decline  Discharge Planning: Home with the benefits of home health.  Patient and family are requesting a face-to-face meeting with hospice of [facility].       Primary Diagnoses: Present on Admission: . Chest pain .  Hypothyroid . COPD (chronic obstructive pulmonary disease) ([place]) . Hyperlipidemia . Essential hypertension . 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 CKD (chronic kidney disease) stage 4, GFR 15-29 ml/min (HCC) . Acute on chronic combined systolic and diastolic CHF (congestive heart failure) ([place])   I have reviewed the medical record, interviewed the patient and family, and examined the patient. The following aspects are pertinent.  Past Medical History:  Diagnosis Date  . Aortic insufficiency 10/2012   a. Mod-severe, not an operable candidate.  . Candida esophagitis ([place])    a. Probable by EGD 03/2015.  . Cellulitis of lower leg   . Chronic obstructive pulmonary disease (HCC)    Chronic bronchitis;  home oxygen; multiple exacerbations  . Chronic respiratory failure ([place])   . Chronic systolic CHF (congestive heart failure) ([place])    a.  Last echo 11/2014: EF 40-45%, diffuse HK, mild LVH, elevated LVEDP, mod-severe AI with severely thickened leaflets, dilated sinus of valsalva 4.5cm, visualized portion of prox ascending aorta 4.7cm, mild MR, mildly dilated LA/RV/RA, PASP 60. LV dysfunction felt due to AI. Cath 12/2014 with minimal CAD - 20% LM otherwise minimal luminal irregularities.  . CKD (chronic kidney disease), stage IV (HCC)    S/P right nephrectomy for hypernephroma in 2010  . Diabetes mellitus, type 2 ([place])   . GI bleed    a. upper GIB in 03/2015 wit thrombocytopenia and ABL anemia. b. Colonoscopy with pooling of dark burgundy blood noted throughout colon but without obvious bleeding lesion. EGD 03/07/2015 showed probable candida esophagitis, mild gastritis, source for GI bleed not seen. c. Capsule study showed active bleeding at 2 hours into the SB.  Marland Kitchen Hyperlipidemia    No lipid profile available  . Hypertension   . Hypothyroidism    10/2002: TSH-0.43, T4-0.77  . Obesity 10/28/2012  . On home O2    2L N/C  . 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 Pulmonary hypertension ([place])   . RBBB   . Seizure disorder ([place])    Onset after craniotomy  . Sleep apnea    Severe on a sleep study in 12/2010  . Tobacco abuse, in remission    20 pack years; discontinued 1998   Social History   Socioeconomic History  . Marital status: Single    Spouse name: None  . Number of children: 1  . Years of education: None  . Highest education level: None  Social Needs  . Financial resource strain: None  . Food insecurity - worry: None  . Food insecurity - inability: None  . Transportation needs - medical: None  . Transportation needs - non-medical: None  Occupational History  . Occupation: Retired  Comment: Engineer, manufacturing systems  Tobacco Use  . Smoking status: Former Smoker    Packs/day: 1.00    Types: Cigarettes    Start date: 11/20/1960    Last attempt to quit: 11/16/1989    Years since quitting: 27.9  . Smokeless tobacco: Never Used  Substance and Sexual Activity  . Alcohol use: No    Alcohol/week: 0.0 oz  . Drug use: No  . Sexual activity: No  Other Topics Concern  . None  Social History Narrative   Lives in [place] alone with good family support from his daughter.   Family History  Problem Relation Age of Onset  . Cancer Mother   . Cancer Father   . Cancer Sister   . Heart failure Sister   . Cancer Brother   . Colon cancer Neg Hx    Scheduled Meds: . albuterol  2.5 mg Nebulization TID  . amLODipine  10 mg Oral Daily  . aspirin EC  81 mg Oral Daily  . calcitRIOL  0.5 mcg Oral Daily  . cloNIDine  0.2 mg Oral TID  . colchicine  0.6 mg Oral Daily  . desmopressin  0.2 mg Oral BID  . dexamethasone  1.5 mg Oral Daily  . enoxaparin (LOVENOX) injection  40 mg Subcutaneous Q24H  . guaiFENesin  600 mg Oral BID  . isosorbide-hydrALAZINE  1 tablet Oral TID  . levETIRAcetam  250 mg Oral BID  . levothyroxine  125 mcg Oral  QAC breakfast  . mouth rinse  15 mL Mouth Rinse BID  . mometasone-formoterol  2 puff Inhalation BID  . phenytoin  100 mg Oral TID  . potassium chloride SA  20 mEq Oral Daily  . simvastatin  20 mg Oral q1800  . sodium chloride flush  3 mL Intravenous Q12H  . tamsulosin  0.4 mg Oral QPC supper  . tiotropium  1 capsule Inhalation Daily   Continuous Infusions: . sodium chloride    . furosemide (LASIX) infusion 4 mg/hr (10/13/17 1127)   PRN Meds:.sodium chloride, acetaminophen, albuterol, diphenoxylate-atropine, nitroGLYCERIN, ondansetron (ZOFRAN) IV, sodium chloride flush Medications Prior to Admission:  Prior to Admission medications   Medication Sig Start Date End Date Taking? Authorizing Provider  acetaminophen (TYLENOL) 325 MG tablet Take 650 mg by mouth every 6 (six) hours as needed for mild pain.    Yes [provider]  ADVAIR DISKUS 250-50 MCG/DOSE AEPB Inhale 1 puff into the lungs daily.  09/09/17  Yes [provider]  albuterol (PROVENTIL) (2.5 MG/3ML) 0.083% nebulizer solution Take 3 mLs (2.5 mg total) by nebulization every 4 (four) hours as needed for wheezing or shortness of breath. 09/12/17  Yes Darrick Meigs, Marge Duncans, MD  amLODipine (NORVASC) 10 MG tablet TAKE ONE TABLET BY MOUTH DAILY. 07/08/17  Yes Cassandria Anger, MD  aspirin EC 81 MG tablet Take 81 mg by mouth daily.   Yes [provider]  calcitRIOL (ROCALTROL) 0.5 MCG capsule Take 0.5 mcg by mouth daily.   Yes [provider]  cloNIDine (CATAPRES) 0.2 MG tablet TAKE (1) TABLET BY MOUTH (3) TIMES DAILY 06/18/17  Yes Herminio Commons, MD  colchicine 0.6 MG tablet Take 1 tablet (0.6 mg total) by mouth daily. 05/11/17 05/11/18 Yes [name], MD  desmopressin (DDAVP) 0.2 MG tablet Take 1 tablet (0.2 mg total) by mouth 2 (two) times daily. 09/30/16  Yes Nida, Marella Chimes, MD  dexamethasone (DECADRON) 1.5 MG tablet Take 1.5 mg by mouth daily.   Yes [provider]  diphenoxylate-atropine (LOMOTIL) 2.5-0.025 MG tablet Take 1 tablet by mouth every 6 (six) hours as needed for diarrhea or loose stools. 08/14/17  Yes [provider]  guaiFENesin (MUCINEX) 600 MG 12 hr tablet Take 1 tablet (600 mg total) by mouth 2 (two) times daily. 09/22/17  Yes Dhungel, Nishant, MD  ipratropium-albuterol (DUONEB) 0.5-2.5 (3) MG/3ML SOLN Take 3 mLs by nebulization 2 (two) times daily. And additional every four hours as needed   Yes [provider]  isosorbide-hydrALAZINE (BIDIL) 20-37.5 MG tablet Take 1 tablet by mouth 3 (three) times daily. 06/17/16  Yes [name], MD  levETIRAcetam (KEPPRA) 250 MG tablet Take 1 tablet (250 mg total) by mouth 2 (two) times daily. 03/12/17  Yes [name], MD  levothyroxine (SYNTHROID, LEVOTHROID) 125 MCG tablet Take 125 mcg by mouth daily before breakfast.  09/09/17  Yes [provider]  loperamide (IMODIUM) 2 MG capsule Take 1 capsule (2 mg total) by mouth 4 (four) times daily as needed for diarrhea or loose stools. 06/16/17  Yes Carmin Muskrat, MD  phenytoin (DILANTIN) 100 MG ER capsule Take 100 mg by mouth 3 (three) times daily.   Yes [provider]  potassium chloride SA (K-DUR,KLOR-CON) 20 MEQ tablet Take 1 tablet daily by mouth. 10/07/17  Yes [provider]  simvastatin (ZOCOR) 20 MG tablet Take 20 mg by mouth daily at 6 PM.  06/21/17  Yes [provider]  SPIRIVA HANDIHALER 18 MCG inhalation capsule Place 1 capsule into inhaler and inhale daily. 07/14/17  Yes [provider]  tamsulosin (FLOMAX) 0.4 MG CAPS capsule Take 1 capsule (0.4 mg total) by mouth daily after supper. 08/26/17  Yes Dhungel, Nishant, MD  testosterone cypionate (DEPOTESTOSTERONE CYPIONATE) 200 MG/ML injection Inject 200 mg into the muscle every 28 (twenty-eight) days.  05/29/17  Yes [provider]  torsemide (DEMADEX) 20 MG tablet Take 3 tablets (60 mg total) by mouth 2 (two) times daily. 08/07/17   Yes [name], MD   No Known Allergies Review of Systems  Unable to perform ROS: Acuity of condition    Physical Exam  Constitutional: [name] oriented to person, place, and time. He appears ill.  Sleepy, does not make eye contact  Cardiovascular: Regular rhythm.  Pulmonary/Chest: Effort normal. No accessory muscle usage. No respiratory distress.  Abdominal: Soft. He exhibits distension.  Musculoskeletal:       Right lower leg: He exhibits edema.       Left lower leg: He exhibits edema.  Darkened skin on bilateral lower extremities  Neurological: [name] alert and oriented to person, place, and time.  Skin: Skin is warm and dry.  Psychiatric: His mood appears not anxious.  Nursing note and vitals reviewed.   Vital Signs: BP (!) 130/49 (BP Location: Left Arm)   Pulse 92   Temp 98.4 F (36.9 C) (Oral)   Resp 18   Ht 6\' 1"  (1.854 m)   Wt 115.2 kg (254 lb)   SpO2 96%   BMI 33.51 kg/m  Pain Assessment: No/denies pain   Pain Score: 0-No pain   SpO2: SpO2: 96 % O2 Device:SpO2: 96 % O2 Flow Rate: .O2 Flow Rate (L/min): 2 L/min  IO: Intake/output summary:   Intake/Output Summary (Last 24 hours) at 10/13/2017 1408 Last data filed at 10/13/2017 1126 Gross per 24 hour  Intake 726 ml  Output 1800 ml  Net -1074 ml    LBM: Last BM Date: 10/12/17 Baseline Weight: Weight: 112.9 kg (249 lb) Most recent weight:  Weight: 115.2 kg (254 lb)     Palliative Assessment/Data:   Flowsheet Rows     Most Recent Value  Intake Tab  Referral Department  Hospitalist  Unit at Time of Referral  Cardiac/Telemetry Unit  Palliative Care Primary Diagnosis  Cardiac  Date Notified  10/12/17  Palliative Care Type  New Palliative care  Reason for referral  Clarify Goals of Care  Date of Admission  10/11/17  Date first seen by Palliative Care  10/12/17  # of days Palliative referral response time  0 Day(s)  # of days IP prior to Palliative referral  1  Clinical Assessment  Palliative  Performance Scale Score  40%  Pain Max last 24 hours  Not able to report  Pain Min Last 24 hours  Not able to report  Dyspnea Max Last 24 Hours  Not able to report  Dyspnea Min Last 24 hours  Not able to report  Psychosocial & Spiritual Assessment  Palliative Care Outcomes  Patient/Family meeting held?  Yes  Who was at the meeting?  patient, called daughter [name], sched meeting 11/6 at [facility]  Provided psychosocial or spiritual support      Time In: 1320 Time Out: 1415 Time Total: 55 minutes Greater than 50%  of this time was spent counseling and coordinating care related to the above assessment and plan.  Signed by: Drue Novel, NP   Please contact Palliative Medicine Team phone at [phone] for questions and concerns.  For individual provider: See Shea Evans

## 2017-10-13 NOTE — Progress Notes (Signed)
PROGRESS NOTE    [name] [name]  FYB:[id number] DOB: 1946-07-30 DOA: 10/11/2017 PCP: [name], MD    Brief Narrative:  [name] [name] is a 71 y.o. male with medical history significant of aortic insufficiency, condylar esophagitis in 2016, history of lower extremity cellulitis, COPD, chronic respiratory failure, chronic systolic CHF, stage IV chronic kidney disease, history of right nephrectomy for hypernephroma in 2010, type 2 diabetes, history of GI bleeding April 2016, hyperlipidemia, hypertension, hypothyroidism, obesity, pan hypopituitarism, pulmonary hypertension, RBBB, seizure disorder after creatinine ultimately, sleep apnea, not on CPAP, history of tobacco abuse (ceased in 1998), recently admitted and discharged on 09/22/2017 for COPD exacerbation who is coming to the emergency department with complaints of progressively worsening dyspnea associated with dizziness for about a week and 2 episodes of exertional sternal chest pain radiated to his left shoulder since yesterday relieved by sublingual nitroglycerin associated with mostly dry cough, wheezing, worsening dyspnea, worsening lower extremities edema and dizziness.  He denies fever, chills, sore throat, abdominal pain, nausea, emesis, constipation, melena or hematochezia.  No dysuria, frequency or hematuria.  Denies polyuria, polydipsia, polyphagia or blurred vision.  ED Course: Initial vital signs in the emergency department temperature 97.10F, pulse 89, blood pressure 131/83 mmHg, respirations 22 and O2 sat 98% on nasal cannula oxygen.  He received furosemide 40 mg IM and sublingual nitroglycerin in the ED.  I also ordered Solu-Medrol 125 mg IVP x1 and a DuoNeb treatment.  His workup shows an EKG with sinus rhythm, APC, borderline prolonged PR, RBBB and LVH without significant changes from previous tracing.  Troponin level was 0.03 ng/mL.  BNP was 2661 pg/mL.  WBC was 5.4, hemoglobin 9.6 g/dL and platelets 134.  CMP shows a sodium  147, potassium 4.0, chloride 111 and bicarbonate 24 mmol/L.  BUN was 58, creatinine 2.82, glucose 89 and magnesium 2.3 mg/dL.  His LFTs show an AST of 44 u/L, but all other values were unremarkable.  Phenytoin level was 11.9 mcg/mL.  His chest radiograph showed cardiomegaly and mild diffuse pulmonary edema with suspected bilateral pleural effusions.  Please see images in full radiology report for further detail.  Cardiology consulted for further management of heart failure given repeated admissions.   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 He received furosemide 40 mg IVP in the emergency department. Continue BiDil 20-37.5 mg p.o. 3 times daily. Follow serial daily BMP Cardiology consulted Lasix 60mg  BID Patient at risk of recurrent decompensated heart failure Palliative care consulted    Chest pain Continue supplemental oxygen. Nitroglycerin sublingually as needed. Monitor and trend troponin level. Continue aspirin, amlodipine and BiDil Cardiology following  Hypothyroid levothyroxine 125 mcg p.o. daily.   Hyperlipidemia simvastatin 20 mg p.o. daily.   Seizure disorder (HCC) Continue Keppra 250 mg p.o. twice daily. Continue phenytoin ER 100 mg p.o. 3 times daily. Phenytoin level was therapeutic at 11.9 mcg/mL.  Diabetes mellitus, type 2 (HCC) Carbohydrate modified diet. CBG monitoring before meals  and at bedtime.  Essential hypertension amlodipine 10 mg p.o. daily. clonidine 0.2 mg p.o. 3 times daily. BiDil 3 times daily. Monitor blood pressure.  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 twice daily. Decadron 1.5 mg p.o. daily. Testosterone injections as scheduled.  COPD (chronic obstructive pulmonary disease) (HCC) Supplemental oxygen. Solu-Medrol 125 mg IVP x1 given. Continue Advair Diskus or formulary equivalent. Continue Spiriva inhaler once a day. Nebulized bronchodilators as needed.  CKD (chronic kidney disease) stage 4, GFR 15-29 ml/min (HCC) Creatinine ranges from 2.4 to 2.92. Serial daily BMP   DVT prophylaxis: Lovenox Code Status: Full code Family Communication: no family bedside; called patient's daughter Disposition Plan: pending further diuresis   Consultants:   Cardiology  Procedures:   None  Antimicrobials:   None    Subjective: Patient seen and examined.  Says he discussed with cardiology that his heart failure is severe.  He mentions his family is supposed to come to the hospital today to discuss his health. Denies chest pain.  Objective: Vitals:   10/13/17 0804 10/13/17 0816 10/13/17 0819 10/13/17 1354  BP:      Pulse:      Resp:      Temp:      TempSrc:      SpO2: 98% 98% 98% 96%  Weight:      Height:        Intake/Output Summary (Last 24 hours) at 10/13/2017 1430 Last data filed at 10/13/2017 1126 Gross per 24 hour  Intake 726 ml  Output 1800 ml  Net -1074 ml   Filed Weights   10/11/17 2246 10/12/17 0500 10/13/17 0643  Weight: 114.1 kg (251 lb 9.6 oz) 113.5 kg (250 lb 3.2 oz) 115.2 kg (254 lb)    Examination:  General exam: Appears calm and comfortable  Respiratory system: BiBasilar crackles, Respiratory effort normal, soft breath sounds Cardiovascular system: S1 & S2 heard, RRR. II/VI systolic murmur, No JVD, rubs, gallops or clicks. No pedal edema. Gastrointestinal  system: Abdomen is distended, soft and nontender. No organomegaly or masses felt. Normal bowel sounds heard. Central nervous system: Alert and oriented. No focal neurological deficits. Extremities: Symmetric 5 x 5 power. Skin: No rashes, lesions or ulcers Psychiatry: Mood & affect appropriate.     Data Reviewed: I have personally reviewed following labs and imaging studies  CBC: Recent Labs  Lab 10/11/17 1840 10/12/17 0701  WBC 5.4 4.6  HGB 9.6* 9.7*  HCT 31.1* 31.2*  MCV 96.9 96.3  PLT 134* 220*   Basic Metabolic Panel: Recent Labs  Lab 10/11/17 1840 10/12/17 0701 10/13/17 0448  NA 147* 142 140  K 4.0 4.4 4.3  CL 111 107 106  CO2 24 23 23   GLUCOSE 89 118* 103*  BUN 58* 60* 66*  CREATININE 2.82* 2.79* 2.80*  CALCIUM 8.7* 8.6* 8.6*  MG 2.3  --   --    GFR: Estimated Creatinine Clearance: 31.7 mL/min (A) (by C-G formula based on SCr of 2.8 mg/dL (H)). Liver Function Tests: Recent Labs  Lab 10/11/17 1831  AST 44*  ALT 48  ALKPHOS 62  BILITOT 0.6  PROT 6.5  ALBUMIN 3.5   No results for input(s): LIPASE, AMYLASE in the last 168 hours. No results for input(s): AMMONIA in the last 168 hours. Coagulation Profile: No results for input(s): INR, PROTIME in the last 168 hours. Cardiac Enzymes: Recent Labs  Lab 10/11/17 1840 10/12/17 0048 10/12/17 0701  TROPONINI 0.03* 0.04* 0.03*   BNP (last 3  results) No results for input(s): PROBNP in the last 8760 hours. HbA1C: No results for input(s): HGBA1C in the last 72 hours. CBG: Recent Labs  Lab 10/12/17 1113 10/12/17 1658 10/12/17 2137 10/13/17 0741 10/13/17 1129  GLUCAP 92 101* 96 103* 106*   Lipid Profile: No results for input(s): CHOL, HDL, LDLCALC, TRIG, CHOLHDL, LDLDIRECT in the last 72 hours. Thyroid Function Tests: No results for input(s): TSH, T4TOTAL, FREET4, T3FREE, THYROIDAB in the last 72 hours. Anemia Panel: No results for input(s): VITAMINB12, FOLATE, FERRITIN, TIBC, IRON, RETICCTPCT in the  last 72 hours. Sepsis Labs: No results for input(s): PROCALCITON, LATICACIDVEN in the last 168 hours.  No results found for this or any previous visit (from the past 240 hour(s)).       Radiology Studies: Dg Chest 2 View  Result Date: 10/11/2017 CLINICAL DATA:  Initial evaluation for acute mid chest pain. History of CHF, hypertension. EXAM: CHEST  2 VIEW COMPARISON:  Prior radiograph from 09/18/2017. FINDINGS: Advanced cardiomegaly, stable. Mediastinal silhouette within normal limits. Lungs normally inflated. Perihilar vascular congestion with diffuse interstitial prominence, suggesting mild pulmonary interstitial edema. Small bilateral pleural effusions are suspected. No definite focal infiltrates. No pneumothorax. No acute osseus abnormality. IMPRESSION: Cardiomegaly with mild diffuse pulmonary interstitial edema with suspected small bilateral pleural effusions. Findings suggestive of acute CHF exacerbation. Electronically Signed   By: Jeannine Boga M.D.   On: 10/11/2017 19:26        Scheduled Meds: . albuterol  2.5 mg Nebulization TID  . amLODipine  10 mg Oral Daily  . aspirin EC  81 mg Oral Daily  . calcitRIOL  0.5 mcg Oral Daily  . cloNIDine  0.2 mg Oral TID  . colchicine  0.6 mg Oral Daily  . desmopressin  0.2 mg Oral BID  . dexamethasone  1.5 mg Oral Daily  . enoxaparin (LOVENOX) injection  40 mg Subcutaneous Q24H  . guaiFENesin  600 mg Oral BID  . isosorbide-hydrALAZINE  1 tablet Oral TID  . levETIRAcetam  250 mg Oral BID  . levothyroxine  125 mcg Oral QAC breakfast  . mouth rinse  15 mL Mouth Rinse BID  . mometasone-formoterol  2 puff Inhalation BID  . phenytoin  100 mg Oral TID  . potassium chloride SA  20 mEq Oral Daily  . simvastatin  20 mg Oral q1800  . sodium chloride flush  3 mL Intravenous Q12H  . tamsulosin  0.4 mg Oral QPC supper  . tiotropium  1 capsule Inhalation Daily   Continuous Infusions: . sodium chloride    . furosemide (LASIX) infusion 4  mg/hr (10/13/17 1127)     LOS: 0 days    Time spent: 35 minutes    Loretha Stapler, MD [facility] Pager [phone]  If 7PM-7AM, please contact night-coverage [email or web address] Password [email or web address] 10/13/2017, 2:30 PM

## 2017-10-13 NOTE — Progress Notes (Signed)
Progress Note  Patient Name: [name] [name] Date of Encounter: 10/13/2017  Primary Cardiologist: Kate Sable MD  Subjective   Tired, dyspnea at rest. Abdomen feels full. No chest pain.  Inpatient Medications    Scheduled Meds: . albuterol  2.5 mg Nebulization TID  . amLODipine  10 mg Oral Daily  . aspirin EC  81 mg Oral Daily  . calcitRIOL  0.5 mcg Oral Daily  . cloNIDine  0.2 mg Oral TID  . colchicine  0.6 mg Oral Daily  . desmopressin  0.2 mg Oral BID  . dexamethasone  1.5 mg Oral Daily  . enoxaparin (LOVENOX) injection  40 mg Subcutaneous Q24H  . furosemide  60 mg Intravenous BID  . guaiFENesin  600 mg Oral BID  . isosorbide-hydrALAZINE  1 tablet Oral TID  . levETIRAcetam  250 mg Oral BID  . levothyroxine  125 mcg Oral QAC breakfast  . mouth rinse  15 mL Mouth Rinse BID  . mometasone-formoterol  2 puff Inhalation BID  . phenytoin  100 mg Oral TID  . potassium chloride SA  20 mEq Oral Daily  . simvastatin  20 mg Oral q1800  . sodium chloride flush  3 mL Intravenous Q12H  . tamsulosin  0.4 mg Oral QPC supper  . tiotropium  1 capsule Inhalation Daily   Continuous Infusions: . sodium chloride     PRN Meds: sodium chloride, acetaminophen, albuterol, diphenoxylate-atropine, nitroGLYCERIN, ondansetron (ZOFRAN) IV, sodium chloride flush   Vital Signs    Vitals:   10/13/17 0643 10/13/17 0804 10/13/17 0816 10/13/17 0819  BP: (!) 130/49     Pulse: 92     Resp: 18     Temp: 98.4 F (36.9 C)     TempSrc: Oral     SpO2: 99% 98% 98% 98%  Weight: 254 lb (115.2 kg)     Height: 6\' 1"  (1.854 m)       Intake/Output Summary (Last 24 hours) at 10/13/2017 0922 Last data filed at 10/13/2017 0844 Gross per 24 hour  Intake 723 ml  Output 1425 ml  Net -702 ml   Filed Weights   10/11/17 2246 10/12/17 0500 10/13/17 0643  Weight: 251 lb 9.6 oz (114.1 kg) 250 lb 3.2 oz (113.5 kg) 254 lb (115.2 kg)    Telemetry    NSR with frequent PAC's - Personally  Reviewed  Physical Exam   GEN:  Chronically ill-appearing. No acute distress.   Neck:  Elevated JVP Cardiac: RRR, occasional irregular beat, 2/6 systolic and diastolic murmurs, no gallops.  Respiratory: Clear to auscultation bilaterally. GI:  Protuberant, nontender.  MS: Bilatral leg edema, left >R.; No deformity. Neuro:  Nonfocal  Psych: Normal affect   Labs    Chemistry Recent Labs  Lab 10/11/17 1831 10/11/17 1840 10/12/17 0701 10/13/17 0448  NA  --  147* 142 140  K  --  4.0 4.4 4.3  CL  --  111 107 106  CO2  --  24 23 23   GLUCOSE  --  89 118* 103*  BUN  --  58* 60* 66*  CREATININE  --  2.82* 2.79* 2.80*  CALCIUM  --  8.7* 8.6* 8.6*  PROT 6.5  --   --   --   ALBUMIN 3.5  --   --   --   AST 44*  --   --   --   ALT 48  --   --   --   ALKPHOS 62  --   --   --  BILITOT 0.6  --   --   --   GFRNONAA  --  21* 21* 21*  GFRAA  --  24* 25* 24*  ANIONGAP  --  12 12 11      Hematology Recent Labs  Lab 10/11/17 1840 10/12/17 0701  WBC 5.4 4.6  RBC 3.21* 3.24*  HGB 9.6* 9.7*  HCT 31.1* 31.2*  MCV 96.9 96.3  MCH 29.9 29.9  MCHC 30.9 31.1  RDW 16.6* 16.6*  PLT 134* 143*    Cardiac Enzymes Recent Labs  Lab 10/11/17 1840 10/12/17 0048 10/12/17 0701  TROPONINI 0.03* 0.04* 0.03*   No results for input(s): TROPIPOC in the last 168 hours.   BNP Recent Labs  Lab 10/11/17 1840  BNP 2,661.0*     Radiology    Dg Chest 2 View  Result Date: 10/11/2017 CLINICAL DATA:  Initial evaluation for acute mid chest pain. History of CHF, hypertension. EXAM: CHEST  2 VIEW COMPARISON:  Prior radiograph from 09/18/2017. FINDINGS: Advanced cardiomegaly, stable. Mediastinal silhouette within normal limits. Lungs normally inflated. Perihilar vascular congestion with diffuse interstitial prominence, suggesting mild pulmonary interstitial edema. Small bilateral pleural effusions are suspected. No definite focal infiltrates. No pneumothorax. No acute osseus abnormality. IMPRESSION:  Cardiomegaly with mild diffuse pulmonary interstitial edema with suspected small bilateral pleural effusions. Findings suggestive of acute CHF exacerbation. Electronically Signed   By: Jeannine Boga M.D.   On: 10/11/2017 19:26    Cardiac Studies   Echocardiogram 08/22/2017 Left ventricle: The cavity size was moderately dilated. Wall   thickness was increased in a pattern of moderate LVH. Systolic   function was mildly reduced. The estimated ejection fraction was   in the range of 45% to 50%. Doppler parameters are consistent   with restrictive physiology, indicative of decreased left   ventricular diastolic compliance and/or increased left atrial   pressure. Doppler parameters are consistent with high ventricular   filling pressure. - Regional wall motion abnormality: Hypokinesis of the basal   inferior and basal inferolateral myocardium; mild hypokinesis of   the mid anterolateral and apical lateral myocardium. Basal   anterior hypokinesis. - Aortic valve: Severely calcified annulus. Trileaflet. There was   severe regurgitation. Regurgitation pressure half-time: 168 ms. - Aorta: Moderate aortic root enlargement. Aortic root dimension:   46 mm (ED). - Mitral valve: Mildly calcified annulus. Normal thickness leaflets   . There was moderate to severe regurgitation. - Left atrium: The atrium was moderately to severely dilated. - Right atrium: The atrium was moderately dilated. - Tricuspid valve: There was moderate-severe regurgitation. - Pulmonary arteries: Systolic pressure was severely increased. PA   peak pressure: 72 mm Hg (S). - Systemic veins: Dilated IVC with normal respiratory variation.   Estimated CVP 8 mmHg.   Patient Profile     71 y.o. 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  Assessment & Plan    1.  Acute on chronic combined congestive heart failure: Multifactorial in the setting of dietary noncompliance along with severe AI.  The patient is on IV diuresis.  [name] diuresed 1.8 L since admission.  Does  not appear to be feeling much better as yet.  [name] dyspneic at rest despite O2 2 L.  Lasix 60 mg twice daily.  Creatinine is essentially stable at 2.8.  I have brought up to the patient end-of-life issues in the setting of complex medical illness and declining clinical status. He wishes to talk with his daughter concerning plans.  Palliative care has been consulted as well.  2.  Severe aortic regurgitation: The patient was not found to be a surgical candidate in the setting of severe COPD and restrictive lung disease.  3.  Chest pain: Resolved with diuresis.  No evidence of ACS this hospitalization.  4.  Hypertension: Currently controlled.  Signed, Phill Myron. West Pugh, ANP, AACC   10/13/2017, 9:22 AM     Attending note:  Patient seen and examined. Reviewed interval hospital course and discussed the case with Ms. West Pugh. He does not report much clinical improvement as yet, still with abdominal distention and leg swelling. Also relative anorexia. No recurrent chest pain.  On examination heart rate is in the 80s in sinus rhythm with PACs by telemetry which I personally reviewed. Systolic blood pressure in the 130s. He has had diuresis of approximately 1.8 L on IV Lasix 60 mg twice daily. Lungs exhibit decreased breath sounds. Cardiac exam reveals RRR with ectopy, 2/6 systolic and diastolic murmur. Abdomen remains protuberant and there is persistent bilateral leg edema.  Lab work shows  creatinine 2.8, potassium 4.3.  Acute on chronic combined heart failure, LVEF 45-50%, associated with severe aortic regurgitation and aortic root dilatation. Patient was previously deemed poor surgical candidate based on workup within the last 2 years and has had clinical decline recently with volume overload. Plan to transition to Lasix drip, follow-up BMET in a.m. Otherwise continue aspirin, Norvasc, clonidine, hydralazine, and Zocor. Replete potassium. Palliative Care consultation pending.  Satira Sark, M.D., F.A.C.C.

## 2017-10-13 NOTE — Progress Notes (Signed)
Nutrition    Nutrition Education Note  RD consulted for nutrition education regarding onset CHF.  RD provided "Heart Fialure Nutrition Therapy" handout from the Academy of [employer]. Reviewed patient's dietary recall. Provided examples on ways to decrease sodium intake in diet. Discouraged intake of processed foods and use of salt shaker. Encouraged fresh fruits and vegetables as well as whole grain sources of carbohydrates to maximize fiber intake.   RD discussed why it is important for patient to adhere to diet recommendations, and emphasized the role of fluids, foods to avoid, and importance of weighing self daily. Teach back method used.  Expect poor compliance based on pt attitude toward education today and hx of non-compliance.  Body mass index is 33.51 kg/m. Pt meets criteria for obesity class I based on current BMI.  Current diet order is heart healthy, patient is consuming approximately 100% of meals at this time. Labs and medications reviewed. No further nutrition interventions warranted at this time. RD contact information provided. If additional nutrition issues arise, please re-consult RD.    Colman Cater MS,RD,CSG,LDN Office: [phone] Pager: [phone]

## 2017-10-14 DIAGNOSIS — Z7189 Other specified counseling: Secondary | ICD-10-CM

## 2017-10-14 LAB — BASIC METABOLIC PANEL
ANION GAP: 10 (ref 5–15)
BUN: 68 mg/dL — AB (ref 6–20)
CALCIUM: 8.3 mg/dL — AB (ref 8.9–10.3)
CO2: 21 mmol/L — AB (ref 22–32)
CREATININE: 2.7 mg/dL — AB (ref 0.61–1.24)
Chloride: 105 mmol/L (ref 101–111)
GFR calc Af Amer: 25 mL/min — ABNORMAL LOW (ref >60)
GFR, EST NON AFRICAN AMERICAN: 22 mL/min — AB (ref >60)
GLUCOSE: 101 mg/dL — AB (ref 65–99)
Potassium: 4.1 mmol/L (ref 3.5–5.1)
Sodium: 136 mmol/L (ref 135–145)

## 2017-10-14 LAB — GLUCOSE, CAPILLARY
GLUCOSE-CAPILLARY: 104 mg/dL — AB (ref 65–99)
Glucose-Capillary: 82 mg/dL (ref 65–99)
Glucose-Capillary: 152 mg/dL — ABNORMAL HIGH (ref 65–99)
Glucose-Capillary: 108 mg/dL — ABNORMAL HIGH (ref 65–99)

## 2017-10-14 NOTE — Progress Notes (Signed)
Progress Note  Patient Name: [name] [name] Date of Encounter: 10/14/2017  Primary Cardiologist: Dr. Kate Sable  Subjective   Continues to feel relatively weak and fatigued.  Abdominal bloating with decreased appetite.  No chest pain or palpitations.  Inpatient Medications    Scheduled Meds: . albuterol  2.5 mg Nebulization TID  . amLODipine  10 mg Oral Daily  . aspirin EC  81 mg Oral Daily  . calcitRIOL  0.5 mcg Oral Daily  . cloNIDine  0.2 mg Oral TID  . colchicine  0.6 mg Oral Daily  . desmopressin  0.2 mg Oral BID  . dexamethasone  1.5 mg Oral Daily  . enoxaparin (LOVENOX) injection  40 mg Subcutaneous Q24H  . guaiFENesin  600 mg Oral BID  . isosorbide-hydrALAZINE  1 tablet Oral TID  . levETIRAcetam  250 mg Oral BID  . levothyroxine  125 mcg Oral QAC breakfast  . mouth rinse  15 mL Mouth Rinse BID  . mometasone-formoterol  2 puff Inhalation BID  . phenytoin  100 mg Oral TID  . potassium chloride SA  20 mEq Oral Daily  . simvastatin  20 mg Oral q1800  . sodium chloride flush  3 mL Intravenous Q12H  . tamsulosin  0.4 mg Oral QPC supper  . tiotropium  1 capsule Inhalation Daily   Continuous Infusions: . sodium chloride    . furosemide (LASIX) infusion 4 mg/hr (10/13/17 1127)   PRN Meds: sodium chloride, acetaminophen, albuterol, diphenoxylate-atropine, nitroGLYCERIN, ondansetron (ZOFRAN) IV, sodium chloride flush   Vital Signs    Vitals:   10/13/17 2033 10/14/17 0500 10/14/17 0521 10/14/17 0820  BP: (!) 130/57 (!) 127/57    Pulse: 89 88    Resp: 20 20    Temp: 98.2 F (36.8 C) (!) 97.3 F (36.3 C)    TempSrc: Oral Oral    SpO2: 100% (!) 88% 98% 96%  Weight:  256 lb (116.1 kg)    Height:        Intake/Output Summary (Last 24 hours) at 10/14/2017 0855 Last data filed at 10/14/2017 0815 Gross per 24 hour  Intake 889.2 ml  Output 2475 ml  Net -1585.8 ml   Filed Weights   10/12/17 0500 10/13/17 0643 10/14/17 0500  Weight: 250 lb 3.2 oz (113.5 kg)  254 lb (115.2 kg) 256 lb (116.1 kg)    Telemetry    Sinus rhythm with PACs.  Personally reviewed.  Physical Exam   GEN:  Chronically ill-appearing.  No distress.   Neck:  Elevated JVP. Cardiac: RRR, 2/6 systolic and diastolic murmurs, no gallop.  Respiratory: Nonlabored. Clear to auscultation bilaterally. GI:  Protuberant, nontender, bowel sounds present. MS: 2-3+ leg edema; No deformity. Neuro:  Nonfocal. Psych: Alert and oriented x 3. Fairly flat affect.  Labs    Chemistry Recent Labs  Lab 10/11/17 1831  10/12/17 0701 10/13/17 0448 10/14/17 0402  NA  --    < > 142 140 136  K  --    < > 4.4 4.3 4.1  CL  --    < > 107 106 105  CO2  --    < > 23 23 21*  GLUCOSE  --    < > 118* 103* 101*  BUN  --    < > 60* 66* 68*  CREATININE  --    < > 2.79* 2.80* 2.70*  CALCIUM  --    < > 8.6* 8.6* 8.3*  PROT 6.5  --   --   --   --  ALBUMIN 3.5  --   --   --   --   AST 44*  --   --   --   --   ALT 48  --   --   --   --   ALKPHOS 62  --   --   --   --   BILITOT 0.6  --   --   --   --   GFRNONAA  --    < > 21* 21* 22*  GFRAA  --    < > 25* 24* 25*  ANIONGAP  --    < > 12 11 10    < > = values in this interval not displayed.     Hematology Recent Labs  Lab 10/11/17 1840 10/12/17 0701  WBC 5.4 4.6  RBC 3.21* 3.24*  HGB 9.6* 9.7*  HCT 31.1* 31.2*  MCV 96.9 96.3  MCH 29.9 29.9  MCHC 30.9 31.1  RDW 16.6* 16.6*  PLT 134* 143*    Cardiac Enzymes Recent Labs  Lab 10/11/17 1840 10/12/17 0048 10/12/17 0701  TROPONINI 0.03* 0.04* 0.03*   No results for input(s): TROPIPOC in the last 168 hours.   BNP Recent Labs  Lab 10/11/17 1840  BNP 2,661.0*     Radiology    No results found.  Cardiac Studies   Echocardiogram 08/22/2017: Study Conclusions  - Left ventricle: The cavity size was moderately dilated. Wall   thickness was increased in a pattern of moderate LVH. Systolic   function was mildly reduced. The estimated ejection fraction was   in the range of 45%  to 50%. Doppler parameters are consistent   with restrictive physiology, indicative of decreased left   ventricular diastolic compliance and/or increased left atrial   pressure. Doppler parameters are consistent with high ventricular   filling pressure. - Regional wall motion abnormality: Hypokinesis of the basal   inferior and basal inferolateral myocardium; mild hypokinesis of   the mid anterolateral and apical lateral myocardium. Basal   anterior hypokinesis. - Aortic valve: Severely calcified annulus. Trileaflet. There was   severe regurgitation. Regurgitation pressure half-time: 168 ms. - Aorta: Moderate aortic root enlargement. Aortic root dimension:   46 mm (ED). - Mitral valve: Mildly calcified annulus. Normal thickness leaflets   . There was moderate to severe regurgitation. - Left atrium: The atrium was moderately to severely dilated. - Right atrium: The atrium was moderately dilated. - Tricuspid valve: There was moderate-severe regurgitation. - Pulmonary arteries: Systolic pressure was severely increased. PA   peak pressure: 72 mm Hg (S). - Systemic veins: Dilated IVC with normal respiratory variation.   Estimated CVP 8 mmHg.  Patient Profile     71 y.o. male with a history of severe aortic regurgitation felt to be poor surgical candidate, chronic combined heart failure with LVEF 45-50%, dilated ascending aorta, minor coronary atherosclerosis based on cardiac catheterization in 2016, type 2 diabetes mellitus, of your COPD and restrictive lung disease with pulmonary hypertension, and CKD stage IV.  [name] admitted with acute on chronic combined heart failure and volume overload.  Assessment & Plan    1.  Acute on chronic combined heart failure complicated by severe aortic regurgitation and severe pulmonary hypertension.  Most recent LVEF 45-50%.  [name] diuresing on Lasix drip, approximately 1500 net out in the last 24 hours despite reported increased weight gain of 2 pounds.   Renal function stable with creatinine 2.7.  2.  Severe aortic regurgitation, previously evaluated and felt to  be poor surgical candidate.  3.  CKD stage IV, creatinine 2.7.  4.  Minor increase in troponin I in the range of 0.03-0.04, most likely consistent with myocardial strain in the setting of acute on chronic combined heart failure and in the face of renal insufficiency.  Not indicative of ACS.  5.  Previously documented minor coronary atherosclerosis by cardiac catheterization in 2016.  Patient without major clinical improvement although urine output is increasing on Lasix drip.  Dose will be increased to 6 mg/h.  Otherwise continue Norvasc, aspirin, clonidine, Zocor, and potassium supplements.  He has been seen by the Palliative Care team and is now DNR, ultimately with plan to transition to hospice care.  Signed, Rozann Lesches, MD  10/14/2017, 8:55 AM

## 2017-10-14 NOTE — Plan of Care (Signed)
Pt's weight has increase by 2 pounds although lasix drip initiated. Pt expressed that he would be going to hospice house at discharge as his daughter thought this would be best. Code status degraded to DNR on 11/6.

## 2017-10-14 NOTE — Care Management (Signed)
Patient Information   Patient Name [name] [name], [name] [name] ([id number]) Sex Male DOB Mar 09, 1946  Room Bed  A339 A339-01  Patient Demographics   Address [address] [place] [address] Phone [phone] (Home)  Patient Ethnicity & Race   Ethnic Group Patient Race  Not Hispanic or Latino Black or African American  Emergency Contact(s)   Name Relation Home Work Mobile  [place],Nadine Daughter [phone]    Christie Beckers [phone]    Documents on File    Status Date Received Description  Documents for the Patient  Release of Information Not Received    Driver's License Not Received    Historic Radiology Documentation Not Received    Historic Radiology Documentation Not Received    Historic Radiology Documentation Not Received    Release of Information Not Received    [place] Received 03/20/11   [place] E-Signature HIPAA Notice of Privacy Received 05/14/15   [place] E-Signature HIPAA Notice of Privacy [place] Not Received    Insurance Card Received 12/21/12   Advance Directives/Living Will/HCPOA/POA Not Received    [place] HIPAA NOTICE OF PRIVACY - Scanned Received 12/22/11 PSCAN ENTERED INFO: [id number]  HIM ROI Authorization Not Received (Expired)    Release of Information Not Received    Financial Application Not Received    [place] Not Received    HIM ROI Authorization Not Received (Expired)  CHL d/c summary for continuity of health  Insurance Card Not Received  old  Insurance Card Not Received  old card  Insurance Card Received 01/11/14 blue medicare  Insurance Card Not Received  old card  HIM ROI Authorization Not Received  [employer] of [place] review  Insurance Card Not Received  aetna medicare  HIM ROI Authorization  01/27/15   HIM ROI Authorization (Expired) 02/27/15 Need complete diagnostic sleep study/titration report. Need all notes from patient admission of 02/22/15. Patient  placed on CPAP at discharge. Need Dr. Luan Pulling; notes.  Release of Information  03/01/15   Other Photo ID Not Received    AMB HH/NH/Hospice  02/05/15 POC [facility]  AMB HH/NH/Hospice  02/01/15 ORDER [facility]  Insurance Card Received 03/26/16 Aetna Medicare  HIM ROI Authorization  08/06/15 [facility]  Advanced Beneficiary Notice (ABN) Not Received    E-Signature AOB [place] Received 12/25/15   Other Photo ID Received 03/26/16 ID exp 2019  Release of Information  09/27/15   HIM ROI Authorization  01/22/16   HIM ROI Authorization  03/17/16   HIM ROI Authorization  08/01/16 [facility]/AETNA Medicare Risk Adjustment Review  Advance Directives/Living Will/HCPOA/POA Received 09/03/16 [facility] - FULL CODE  HIM ROI Authorization  10/16/16 pt acct - deidra price  HIM ROI Authorization (Expired) 11/13/16 Dr. [name]  [place] E-Signature HIPAA Notice of [place] E-Signature HIPAA Notice of Privacy Signed 12/16/16   Insurance Card Received 04/12/17 [place]  HIM ROI Authorization (Expired) 01/14/17 [place] Authorization  03/10/17 NO AUTHO  HIM ROI Authorization  68/12/75   Driver's License (Deleted)   DL exp 2019  Documents for the Encounter  AOB (Assignment of Insurance Benefits) Not Received    E-signature AOB Signed 10/11/17   MEDICARE RIGHTS Not Received    E-signature Medicare Rights Signed 10/11/17   ED Patient Billing Extract   ED PB Summary  Cardiac Monitoring Strip Shift Summary Received  10/11/17   Cardiac Monitoring Strip Received 10/12/17   Medicare Observation Received 10/12/17   EKG Received 10/12/17   Admission Information   Attending Provider Admitting Provider Admission Type Admission Date/Time  Louellen Molder, MD Reubin Milan, MD Emergency 10/11/17 1757  Discharge Date Hospital Service Auth/Cert Status Service Area   Internal Medicine  Incomplete [place]  Unit Room/Bed Admission Status   AP-DEPT 300 A339/A339-01 Admission (Confirmed)   Admission   Complaint  chest pain and shortness of breath  Hospital Account   Name Acct ID Class Status Primary Coverage  [name] [name], [name] [name] [id number] Inpatient Open [place]      Guarantor Account (for Hospital Account [id number])   Name Relation to Pt Service Area Active? Acct Type  [name] [name] Self [facility]SA Yes Personal/Family  Address Phone    [address] [place], [place] [address] [phone])        Coverage Information (for Hospital Account [id number])   F/O Payor/Plan Precert #  [facility]   Subscriber Subscriber #  [name] [name], [name] [name] [id number]  Address Phone  [place] [address] [phone]

## 2017-10-14 NOTE — Progress Notes (Addendum)
PROGRESS NOTE                                                                                                                                                                                                             Patient Demographics:    [name] [name], is a 71 y.o. male, DOB - [birth year], GHW:[id number]  Admit date - 10/11/2017   Admitting Physician Reubin Milan, MD  Outpatient Primary MD for the patient is [name], MD  LOS - 1  Outpatient Specialists:  Chief Complaint  Patient presents with  . Chest Pain       Brief Narrative   71 year old male with Chronic combined systolic and diastolic CHF, COPD With recent hospitalization,chronic kidney disease stage IV, hypothyroidism, type 2 diabetes mellitus, hypertension and aortic insufficiency And recurrent hospitalizations Presented with exertional substernal chest pain with findings of acute on chronic combined systolic and diastolic CHF.   Subjective:   Patient reports dyspnea on minimal exertion and still having leg swellings.Negative Balance of 3.3 L since admission.   Assessment  & Plan :    Principal Problem:    Acute on chronic combined systolic and diastolic CHF (congestive heart failure) (HCC) No significant Improvement with IV Lasix twice a day. Started on Lasix drip by cardiology.Monitor strict I/O and daily weight. Monitor daily Lites.  Recent 2-D echo with EF of 45-50%. Continue aspirin, statin.      COPD (chronic obstructive pulmonary disease) (HCC) No signs of acute exacerbation. Will discontinue steroid. Continue when necessary home Nebs and inhalers.  Elevated troponin Mild and likely demand ischemia with associated CHF. No signs of ACS.  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 twice daily. Decadron 1.5 mg p.o. daily. Testosterone injections as scheduled     CKD (chronic kidney  disease) stage 4, GFR 15-29 ml/min (HCC) Renal function at baseline. Monitor Was on Lasix drip  Seizure disorder Continue Keppra and phenytoin.   Diabetes mellitus type 2  Monitor on sliding scale coverage   Goals of care Palliative care consult appreciated. Plan on home with home hospice .     Code Status :DO NOT RESUSCITATE  Family Communication  : Brother at bedside  Disposition Plan  :Home with home hospice once improved  Barriers For Discharge : Active symptoms  Consults  :  Cardiology Palliative care  Procedures  :  None  DVT Prophylaxis  :  Heparin  Lab Results  Component Value Date   PLT 143 (L) 10/12/2017    Antibiotics  :    Anti-infectives (From admission, onward)   None        Objective:   Vitals:   10/13/17 2033 10/14/17 0500 10/14/17 0521 10/14/17 0820  BP: (!) 130/57 (!) 127/57    Pulse: 89 88    Resp: 20 20    Temp: 98.2 F (36.8 C) (!) 97.3 F (36.3 C)    TempSrc: Oral Oral    SpO2: 100% (!) 88% 98% 96%  Weight:  116.1 kg (256 lb)    Height:        Wt Readings from Last 3 Encounters:  10/14/17 116.1 kg (256 lb)  09/19/17 113.4 kg (249 lb 14.4 oz)  09/12/17 109.8 kg (242 lb)     Intake/Output Summary (Last 24 hours) at 10/14/2017 1338 Last data filed at 10/14/2017 0900 Gross per 24 hour  Intake 526.2 ml  Output 1700 ml  Net -1173.8 ml     Physical Exam  Gen: not in distress HEENT:  moist mucosa, supple neck Chest: Diminished bibasilar breath sounds CVS: N S1&S2, no murmurs, rubs or gallop GI: soft, NT, ND, BS+ Musculoskeletal: warm,2+ pitting edema bilaterally CNS: AAOX3, non focal    Data Review:    CBC Recent Labs  Lab 10/11/17 1840 10/12/17 0701  WBC 5.4 4.6  HGB 9.6* 9.7*  HCT 31.1* 31.2*  PLT 134* 143*  MCV 96.9 96.3  MCH 29.9 29.9  MCHC 30.9 31.1  RDW 16.6* 16.6*    Chemistries  Recent Labs  Lab 10/11/17 1831 10/11/17 1840 10/12/17 0701 10/13/17 0448 10/14/17 0402  NA  --  147* 142  140 136  K  --  4.0 4.4 4.3 4.1  CL  --  111 107 106 105  CO2  --  24 23 23  21*  GLUCOSE  --  89 118* 103* 101*  BUN  --  58* 60* 66* 68*  CREATININE  --  2.82* 2.79* 2.80* 2.70*  CALCIUM  --  8.7* 8.6* 8.6* 8.3*  MG  --  2.3  --   --   --   AST 44*  --   --   --   --   ALT 48  --   --   --   --   ALKPHOS 62  --   --   --   --   BILITOT 0.6  --   --   --   --    ------------------------------------------------------------------------------------------------------------------ No results for input(s): CHOL, HDL, LDLCALC, TRIG, CHOLHDL, LDLDIRECT in the last 72 hours.  Lab Results  Component Value Date   HGBA1C 5.8 (H) 01/22/2017   ------------------------------------------------------------------------------------------------------------------ No results for input(s): TSH, T4TOTAL, T3FREE, THYROIDAB in the last 72 hours.  Invalid input(s): FREET3 ------------------------------------------------------------------------------------------------------------------ No results for input(s): VITAMINB12, FOLATE, FERRITIN, TIBC, IRON, RETICCTPCT in the last 72 hours.  Coagulation profile No results for input(s): INR, PROTIME in the last 168 hours.  No results for input(s): DDIMER in the last 72 hours.  Cardiac Enzymes Recent Labs  Lab 10/11/17 1840 10/12/17 0048 10/12/17 0701  TROPONINI 0.03* 0.04* 0.03*   ------------------------------------------------------------------------------------------------------------------    Component Value Date/Time   BNP 2,661.0 (H) 10/11/2017 1840   BNP 385.9 (H) 05/13/2013 1345    Inpatient Medications  Scheduled Meds: . albuterol  2.5 mg Nebulization TID  .  amLODipine  10 mg Oral Daily  . aspirin EC  81 mg Oral Daily  . calcitRIOL  0.5 mcg Oral Daily  . cloNIDine  0.2 mg Oral TID  . colchicine  0.6 mg Oral Daily  . desmopressin  0.2 mg Oral BID  . dexamethasone  1.5 mg Oral Daily  . enoxaparin (LOVENOX) injection  40 mg Subcutaneous  Q24H  . guaiFENesin  600 mg Oral BID  . isosorbide-hydrALAZINE  1 tablet Oral TID  . levETIRAcetam  250 mg Oral BID  . levothyroxine  125 mcg Oral QAC breakfast  . mouth rinse  15 mL Mouth Rinse BID  . mometasone-formoterol  2 puff Inhalation BID  . phenytoin  100 mg Oral TID  . potassium chloride SA  20 mEq Oral Daily  . simvastatin  20 mg Oral q1800  . sodium chloride flush  3 mL Intravenous Q12H  . tamsulosin  0.4 mg Oral QPC supper  . tiotropium  1 capsule Inhalation Daily   Continuous Infusions: . sodium chloride    . furosemide (LASIX) infusion 6 mg/hr (10/14/17 0910)   PRN Meds:.sodium chloride, acetaminophen, albuterol, diphenoxylate-atropine, nitroGLYCERIN, ondansetron (ZOFRAN) IV, sodium chloride flush  Micro Results No results found for this or any previous visit (from the past 240 hour(s)).  Radiology Reports Dg Chest 2 View  Result Date: 10/11/2017 CLINICAL DATA:  Initial evaluation for acute mid chest pain. History of CHF, hypertension. EXAM: CHEST  2 VIEW COMPARISON:  Prior radiograph from 09/18/2017. FINDINGS: Advanced cardiomegaly, stable. Mediastinal silhouette within normal limits. Lungs normally inflated. Perihilar vascular congestion with diffuse interstitial prominence, suggesting mild pulmonary interstitial edema. Small bilateral pleural effusions are suspected. No definite focal infiltrates. No pneumothorax. No acute osseus abnormality. IMPRESSION: Cardiomegaly with mild diffuse pulmonary interstitial edema with suspected small bilateral pleural effusions. Findings suggestive of acute CHF exacerbation. Electronically Signed   By: Jeannine Boga M.D.   On: 10/11/2017 19:26   Dg Chest 2 View  Result Date: 09/18/2017 CLINICAL DATA:  Increasing chest pain shortness of appears increasing left chest pain and shortness of breath since yesterday. EXAM: CHEST  2 VIEW COMPARISON:  PA and lateral chest 08/21/2017 and 09/10/2017. CT chest 12/17/2016. FINDINGS: There  is marked cardiomegaly without edema. No consolidative process, pneumothorax or effusion. No acute bony abnormality. IMPRESSION: Cardiomegaly without acute disease. Electronically Signed   By: Inge Rise M.D.   On: 09/18/2017 17:37    Time Spent in minutes  35   Louellen Molder M.D on 10/14/2017 at 1:38 PM  Between 7am to 7pm - Pager - [phone]  After 7pm go to [email or web address] - password [email or web address]  [facility] -  Office  [phone]

## 2017-10-14 NOTE — Progress Notes (Signed)
Daily Progress Note   Patient Name: [name] [name]       Date: 10/14/2017 DOB: 1946/01/12  Age: 71 y.o. MRN#: [id number] Attending Physician: Louellen Molder, MD Primary Care Physician: [name], MD Admit Date: 10/11/2017  Reason for Consultation/Follow-up: Establishing goals of care and Psychosocial/spiritual support  Subjective: [name] [name] is resting quietly in bed.  He will briefly make but not keep eye contact.  He tells me that he has been sleeping.  There is no family at bedside at this time.   I noticed that his IV has pulled out.  He states that he was not aware, I shared that this happens sometimes when people are asleep, no problems.  I asked him if he would like his IV replaced.  He looks at the Lasix drip and states that it "has to be".  I share with him that he decides whether he has IVs or not.  At this point, [name] [name] states that he would like the IV replaced, Lasix drips to continue.  We talked about disposition, going home with the benefits of hospice.  [name] [name] states that in speaking with his daughter, [name], he understands that he will be going to the hospice home.  I shared that I do not believe he qualifies for hospice home, but I will call his daughter [name].  Call to daughter [name] who states the plan continues to be home with the benefits of hospice of [facility].  She states that in the future, they may need the benefit of residential hospice home placement.  We talked about the cardiology plan to increase Lasix with a goal of helping [name] [name] feel better.  I share my concern over return of fluid overload, how long [name] [name] will be able to keep the fluid off.  We talked about [name] [name] IV coming out, and my reassuring him that he has the right to say what  treatments he does and does not want.  [name] states that she ask nursing to make a photocopy for her of the dietitian plan.  She shares that she will assist to [name] [name] and preparing food.  [name] states her concerns that her father was sitting in the chair, sleeping hard, and when she woke him he was a little confused for a few minutes.  We  talked about the difficulty and resting well in the hospital, and that I feel the brief bout of confusion is not unexpected, especially as [name] [name] cleared relatively quickly.  No further questions or concerns.  Encouraged to call as needed.  Length of Stay: 1  Current Medications: Scheduled Meds:  . albuterol  2.5 mg Nebulization TID  . amLODipine  10 mg Oral Daily  . aspirin EC  81 mg Oral Daily  . calcitRIOL  0.5 mcg Oral Daily  . cloNIDine  0.2 mg Oral TID  . colchicine  0.6 mg Oral Daily  . desmopressin  0.2 mg Oral BID  . dexamethasone  1.5 mg Oral Daily  . enoxaparin (LOVENOX) injection  40 mg Subcutaneous Q24H  . guaiFENesin  600 mg Oral BID  . isosorbide-hydrALAZINE  1 tablet Oral TID  . levETIRAcetam  250 mg Oral BID  . levothyroxine  125 mcg Oral QAC breakfast  . mouth rinse  15 mL Mouth Rinse BID  . mometasone-formoterol  2 puff Inhalation BID  . phenytoin  100 mg Oral TID  . potassium chloride SA  20 mEq Oral Daily  . simvastatin  20 mg Oral q1800  . sodium chloride flush  3 mL Intravenous Q12H  . tamsulosin  0.4 mg Oral QPC supper  . tiotropium  1 capsule Inhalation Daily    Continuous Infusions: . sodium chloride    . furosemide (LASIX) infusion 6 mg/hr (10/14/17 0910)    PRN Meds: sodium chloride, acetaminophen, albuterol, diphenoxylate-atropine, nitroGLYCERIN, ondansetron (ZOFRAN) IV, sodium chloride flush  Physical Exam  Constitutional: He does not appear ill.  Makes but does not keep eye contact, calm and cooperative  HENT:  Head: Normocephalic and atraumatic.  Cardiovascular: Normal rate and regular rhythm.    Pulmonary/Chest: Effort normal. No respiratory distress.  Abdominal: Soft. He exhibits distension.  Musculoskeletal:       Right lower leg: He exhibits edema.       Left lower leg: He exhibits edema.  Neurological: [name] alert.  Skin: Skin is warm and dry.  Venous stasis changes to bilateral lower extremities  Psychiatric: His mood appears not anxious. [name] not agitated.  Nursing note and vitals reviewed.           Vital Signs: BP (!) 127/57 (BP Location: Left Arm)   Pulse 88   Temp (!) 97.3 F (36.3 C) (Oral)   Resp 20   Ht 6\' 1"  (1.854 m)   Wt 116.1 kg (256 lb)   SpO2 96%   BMI 33.78 kg/m  SpO2: SpO2: 96 % O2 Device: O2 Device: Nasal Cannula O2 Flow Rate: O2 Flow Rate (L/min): 2 L/min  Intake/output summary:   Intake/Output Summary (Last 24 hours) at 10/14/2017 1231 Last data filed at 10/14/2017 0900 Gross per 24 hour  Intake 526.2 ml  Output 1700 ml  Net -1173.8 ml   LBM: Last BM Date: 10/12/17 Baseline Weight: Weight: 112.9 kg (249 lb) Most recent weight: Weight: 116.1 kg (256 lb)       Palliative Assessment/Data:    Flowsheet Rows     Most Recent Value  Intake Tab  Referral Department  Hospitalist  Unit at Time of Referral  Cardiac/Telemetry Unit  Palliative Care Primary Diagnosis  Cardiac  Date Notified  10/12/17  Palliative Care Type  New Palliative care  Reason for referral  Clarify Goals of Care  Date of Admission  10/11/17  Date first seen by Palliative Care  10/12/17  # of days  Palliative referral response time  0 Day(s)  # of days IP prior to Palliative referral  1  Clinical Assessment  Palliative Performance Scale Score  40%  Pain Max last 24 hours  Not able to report  Pain Min Last 24 hours  Not able to report  Dyspnea Max Last 24 Hours  Not able to report  Dyspnea Min Last 24 hours  Not able to report  Psychosocial & Spiritual Assessment  Palliative Care Outcomes  Patient/Family meeting held?  Yes  Who was at the meeting?  patient,  called daughter [name], sched meeting 11/6 at [place]  Provided psychosocial or spiritual support      Patient Active Problem List   Diagnosis Date Noted  . Congestive heart failure (CHF) ([place]) 10/13/2017  . Palliative care encounter   . Goals of care, counseling/discussion   . DNR (do not resuscitate) discussion   . Acute on chronic diastolic CHF (congestive heart failure) ([place]) 09/18/2017  . Chronic systolic CHF (congestive heart failure) ([place]) 09/11/2017  . CKD (chronic kidney disease) stage 4, GFR 15-29 ml/min (HCC)   . Acute and chronic respiratory failure with hypoxia ([place]) 08/21/2017  . Acute on chronic combined systolic and diastolic CHF (congestive heart failure) ([place]) 08/04/2017  . COPD with acute exacerbation ([place]) 06/06/2017  . Chest pain 06/05/2017  . Acute on chronic respiratory failure with hypoxia ([place]) 05/08/2017  . Idiopathic chronic gout of right knee without tophus   . Acute gout due to renal impairment involving right wrist 04/12/2017  . ARF (acute renal failure) ([place]) 04/12/2017  . Seizure ([place]) 03/09/2017  . Aortic root dilatation ([place]) 02/24/2017  . Chronic respiratory failure ([place]) 02/16/2017  . Acute on chronic diastolic heart failure ([place]) 02/16/2017  . COPD exacerbation ([place]) 01/22/2017  . Effusion of right knee 11/03/2016  . CAD-minor CAD Jan 2016 08/04/2016  . COPD (chronic obstructive pulmonary disease) ([place]) 08/03/2016  . CKD (chronic kidney disease), stage IV ([place]) 03/26/2016  . Septic arthritis ([place]) 12/28/2015  . 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 Morbid obesity due to excess calories ([place]) 09/14/2015  . Vitamin D deficiency 09/14/2015  . Thrombocytopenia due to blood loss   . GI bleed 03/03/2015  . Upper GI bleed 03/02/2015  . Seizures ([place]) 02/14/2015  . 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 Essential hypertension 02/14/2015  . Hemorrhoids  01/24/2015  . Constipation 01/24/2015  . Aortic insufficiency   . Chronic combined systolic and diastolic CHF  25/42/7062  . Seizure disorder ([place]) 03/27/2013  . Diabetes mellitus, type 2 ([place]) 03/27/2013  . Sleep apnea   . Hypothyroid   . Tobacco abuse, in remission   . Hyperlipidemia   . Panhypopituitarism ([place])   . Moderate aortic regurgitation 10/08/2012    Palliative Care Assessment & Plan   Patient Profile: 71 y.o. male  with past medical history of aortic insufficiency, COPD, chronic respiratory failure, chronic combined heart failure, stage IV kidney disease, history of right nephrectomy, type 2 diabetes, high blood pressure and cholesterol, obesity, pulmonary hypertension, right bundle branch block, sleep apnea not on CPAP, admitted on 10/11/2017 with chest pain, acute on chronic heart failure.  Assessment: acute on chronic heart failure:  Cardio increase Lasix drip today, goal of diuresis to help [name] [name] with feeling better.  Goal continues to be return home with the benefits of hospice of [facility] for in-home services.  Recommendations/Plan:  Continue to treat the treatable,  no extraordinary measures such as CPR or intubation.  Face-to-face meeting with hospice of [facility] upon discharge home.  Goals of Care and Additional Recommendations:  Limitations on Scope of Treatment: Treat the treatable, no CPR, no intubation.  Nadine and I discussed returning to the hospital during yesterday's phone call.  No decisions made at this time.  Code Status:    Code Status Orders  (From admission, onward)        Start     Ordered   10/13/17 1404  Do not attempt resuscitation (DNR)  Continuous    Question Answer Comment  In the event of cardiac or respiratory ARREST Do not call a "code blue"   In the event of cardiac or respiratory ARREST Do not perform Intubation, CPR, defibrillation or ACLS   In the event of cardiac or respiratory ARREST Use medication by  any route, position, wound care, and other measures to relive pain and suffering. May use oxygen, suction and manual treatment of airway obstruction as needed for comfort.      10/13/17 1403    Code Status History    Date Active Date Inactive Code Status Order ID Comments User Context   10/11/2017 22:20 10/13/2017 14:03 Full Code [id number]  Reubin Milan, MD Inpatient   10/11/2017 22:20 10/11/2017 22:20 Full Code [id number]  Reubin Milan, MD Inpatient   09/18/2017 22:58 09/22/2017 14:28 Full Code [id number]  [name], MD ED   09/11/2017 00:19 09/12/2017 15:18 Full Code [id number]  [name], MD ED   08/21/2017 20:34 08/26/2017 16:51 Full Code [id number]  Truett Mainland, DO Inpatient   08/04/2017 17:32 08/07/2017 17:35 Full Code [id number]  Erline Hau, MD Inpatient   06/05/2017 02:01 06/08/2017 17:47 Full Code [id number]  Oswald Hillock, MD ED   05/08/2017 20:53 05/11/2017 14:50 Full Code [id number]  Truett Mainland, DO Inpatient   04/12/2017 16:01 04/16/2017 14:50 Full Code [id number]  Erline Hau, MD Inpatient   03/09/2017 17:50 03/12/2017 17:04 Full Code [id number]  Hosie Poisson, MD Inpatient   02/16/2017 10:15 02/18/2017 13:43 Full Code [id number]  Kathie Dike, MD Inpatient   01/22/2017 16:22 01/27/2017 14:55 Full Code [id number]  Samuella Cota, MD Inpatient   12/16/2016 06:19 12/21/2016 16:35 Full Code [id number]  Orvan Falconer, MD ED   11/03/2016 20:00 11/07/2016 14:36 Full Code [id number]  Truett Mainland, DO Inpatient   09/12/2016 16:01 09/15/2016 13:41 Full Code [id number]  Thurnell Lose, MD Inpatient   08/03/2016 14:55 08/08/2016 18:10 Full Code [id number]  Kathie Dike, MD Inpatient   07/20/2016 14:17 07/23/2016 15:01 Full Code [id number]  Erline Hau, MD Inpatient   06/11/2016 07:52 06/17/2016 16:01 Full Code [id number]  Orvan Falconer, MD Inpatient   03/26/2016 20:57 03/28/2016 15:37 Full Code [id number]  Orson Eva, MD Inpatient   12/28/2015 20:46 12/31/2015  19:40 Full Code [id number]  Truett Mainland, DO Inpatient   10/26/2015 18:52 11/01/2015 16:11 Full Code [id number]  Doree Albee, MD ED   06/25/2015 16:00 06/27/2015 14:10 Full Code [id number]  Doree Albee, MD ED   05/09/2015 03:06 05/11/2015 14:18 Full Code [id number]  Orvan Falconer, MD Inpatient   03/03/2015 00:16 03/10/2015 16:39 Full Code [id number]  Rise Patience, MD Inpatient   02/14/2015 20:34 02/19/2015 14:27 Full Code [id number]  Waldemar Dickens, MD Inpatient   12/12/2014 18:54 12/19/2014 17:04 Full Code [id number]  Larey Dresser, MD Inpatient   12/04/2014 17:27 12/12/2014 18:54 Full Code [id number]  Samuella Cota, MD Inpatient   06/12/2014 01:29 06/14/2014 13:03 Full Code [id number]  Oswald Hillock, MD Inpatient   01/07/2013 21:24 01/13/2013 14:14 Full Code [id number]  Asencion Noble, MD ED   10/26/2012 18:18 10/31/2012 14:23 Full Code [id number]  Alonza Bogus, MD Inpatient       Prognosis:  < 3 months would not be surprising based on 8 hospitalizations in 6 months, end-stage heart disease, frailty, functional decline  Discharge Planning:  A face-to-face meeting with hospice of [facility] upon discharge home.  Care plan was discussed with nursing staff, case management, social worker, cardiology, and Dr. Clementeen Graham.   Thank you for allowing the Palliative Medicine Team to assist in the care of this patient.   Time In: 1200 Time Out: 1225 Total Time 25 minutes Prolonged Time Billed  no       Greater than 50%  of this time was spent counseling and coordinating care related to the above assessment and plan.  Drue Novel, NP  Please contact Palliative Medicine Team phone at [phone] for questions and concerns.

## 2017-10-15 LAB — BASIC METABOLIC PANEL
Anion gap: 8 (ref 5–15)
BUN: 67 mg/dL — AB (ref 6–20)
CALCIUM: 8.3 mg/dL — AB (ref 8.9–10.3)
CO2: 25 mmol/L (ref 22–32)
CREATININE: 2.68 mg/dL — AB (ref 0.61–1.24)
Chloride: 105 mmol/L (ref 101–111)
GFR calc non Af Amer: 22 mL/min — ABNORMAL LOW (ref >60)
GFR, EST AFRICAN AMERICAN: 26 mL/min — AB (ref >60)
GLUCOSE: 103 mg/dL — AB (ref 65–99)
Potassium: 4.3 mmol/L (ref 3.5–5.1)
Sodium: 138 mmol/L (ref 135–145)

## 2017-10-15 LAB — GLUCOSE, CAPILLARY
GLUCOSE-CAPILLARY: 124 mg/dL — AB (ref 65–99)
Glucose-Capillary: 89 mg/dL (ref 65–99)
Glucose-Capillary: 89 mg/dL (ref 65–99)

## 2017-10-15 NOTE — Progress Notes (Signed)
[facility] PROGRESS NOTE  [name] [name] GEX:[id number] DOB: 1946-08-15 DOA: 10/11/2017 PCP: [name] Fire, MD  Brief summary   71 year old male with Chronic combined systolic and diastolic CHF, COPD With recent hospitalization,chronic kidney disease stage IV, hypothyroidism, type 2 diabetes mellitus, hypertension and aortic insufficiency And recurrent hospitalizations Presented with exertional substernal chest pain with findings of acute on chronic combined systolic and diastolic CHF.  Assessment/Plan:  Acute on chronic combined systolic and diastolic CHF (congestive heart failure) ([place]). Severe pulmonary HTN. Aortic regurgitation, MR. systolic HF. Right heart failure. No significant Improvement with IV Lasix twice a day. Then transitioned to iv Lasix drip by cardiology. Monitor strict I/O and daily weight. Monitor daily Lites. Recent 2-D echo with EF of 45-50%. Continue aspirin, statin. -end stage heart failure. Likely transition to oral palliative diuresis soon with hospice at home   COPD (chronic obstructive pulmonary disease) (HCC) No signs of acute exacerbation. Continue when necessary home Nebs and inhalers.  Elevated troponin. Mild and likely demand ischemia with associated CHF. No signs of ACS.  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 Desmopressin 0.2 mg p.o. twice daily.Decadron 1.5 mg p.o. Daily. Testosterone injections as scheduled   CKD (chronic kidney disease) stage 4, GFR 15-29 ml/min ([place]). Renal function at baseline. Monitor Was on Lasix drip  Seizure disorder. Continue Keppra and phenytoin.   Diabetes mellitus type 2. Monitor on sliding scale coverage   Goals of care. Palliative care consult appreciated. Plan on home with home hospice .   Code Status: DNR Family Communication: d/w patient, RN. Cardiology.  (indicate person spoken with, relationship, and if by phone, the number) Disposition Plan: hospice home  soon   Consultants:  Cardiology  Palliative care   Procedures:  none  Antibiotics:  none (indicate start date, and stop date if known)  HPI/Subjective: Alert. Reports chronic dyspnea. On iv lasix 3  Objective: Vitals:   10/15/17 0839 10/15/17 0931  BP:  (!) 124/53  Pulse:    Resp:    Temp:    SpO2: 94%     Intake/Output Summary (Last 24 hours) at 10/15/2017 1018 Last data filed at 10/15/2017 0600 Gross per 24 hour  Intake 885.67 ml  Output 1300 ml  Net -414.33 ml   Filed Weights   10/13/17 0643 10/14/17 0500 10/15/17 0553  Weight: 115.2 kg (254 lb) 116.1 kg (256 lb) 117.9 kg (259 lb 14.4 oz)    Exam:   General:  No distress   Cardiovascular: W1,U2 rrr. systolic and diastolic mr   Respiratory: BL crackles LL  Abdomen: soft, obese, nt   Musculoskeletal: + leg edema    Data Reviewed: Basic Metabolic Panel: Recent Labs  Lab 10/11/17 1840 10/12/17 0701 10/13/17 0448 10/14/17 0402 10/15/17 0530  NA 147* 142 140 136 138  K 4.0 4.4 4.3 4.1 4.3  CL 111 107 106 105 105  CO2 24 23 23  21* 25  GLUCOSE 89 118* 103* 101* 103*  BUN 58* 60* 66* 68* 67*  CREATININE 2.82* 2.79* 2.80* 2.70* 2.68*  CALCIUM 8.7* 8.6* 8.6* 8.3* 8.3*  MG 2.3  --   --   --   --    Liver Function Tests: Recent Labs  Lab 10/11/17 1831  AST 44*  ALT 48  ALKPHOS 62  BILITOT 0.6  PROT 6.5  ALBUMIN 3.5   No results for input(s): LIPASE, AMYLASE in the last 168 hours. No results for input(s): AMMONIA in the last 168 hours. CBC: Recent Labs  Lab 10/11/17 1840  10/12/17 0701  WBC 5.4 4.6  HGB 9.6* 9.7*  HCT 31.1* 31.2*  MCV 96.9 96.3  PLT 134* 143*   Cardiac Enzymes: Recent Labs  Lab 10/11/17 1840 10/12/17 0048 10/12/17 0701  TROPONINI 0.03* 0.04* 0.03*   BNP (last 3 results) Recent Labs    09/10/17 2112 09/18/17 2022 10/11/17 1840  BNP 1,127.0* 2,356.0* 2,661.0*    ProBNP (last 3 results) No results for input(s): PROBNP in the last 8760  hours.  CBG: Recent Labs  Lab 10/14/17 0755 10/14/17 1124 10/14/17 1639 10/14/17 2127 10/15/17 0759  GLUCAP 82 152* 104* 108* 89    No results found for this or any previous visit (from the past 240 hour(s)).   Studies: No results found.  Scheduled Meds: . albuterol  2.5 mg Nebulization TID  . amLODipine  10 mg Oral Daily  . aspirin EC  81 mg Oral Daily  . calcitRIOL  0.5 mcg Oral Daily  . cloNIDine  0.2 mg Oral TID  . colchicine  0.6 mg Oral Daily  . desmopressin  0.2 mg Oral BID  . dexamethasone  1.5 mg Oral Daily  . enoxaparin (LOVENOX) injection  40 mg Subcutaneous Q24H  . guaiFENesin  600 mg Oral BID  . isosorbide-hydrALAZINE  1 tablet Oral TID  . levETIRAcetam  250 mg Oral BID  . levothyroxine  125 mcg Oral QAC breakfast  . mouth rinse  15 mL Mouth Rinse BID  . mometasone-formoterol  2 puff Inhalation BID  . phenytoin  100 mg Oral TID  . potassium chloride SA  20 mEq Oral Daily  . simvastatin  20 mg Oral q1800  . sodium chloride flush  3 mL Intravenous Q12H  . tamsulosin  0.4 mg Oral QPC supper  . tiotropium  1 capsule Inhalation Daily   Continuous Infusions: . sodium chloride    . 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 If 7PM-7AM, please  contact night-coverage at [email or web address], password [facility] 10/15/2017, 10:18 AM  LOS: 2 days

## 2017-10-15 NOTE — Progress Notes (Signed)
Progress Note  Patient Name: [name] [name] Date of Encounter: 10/15/2017  Primary Cardiologist: Dr. Kate Sable, MD  Subjective   Generalized fatigue. DOE remains an issue for him. No chest pain.  Inpatient Medications    Scheduled Meds: . albuterol  2.5 mg Nebulization TID  . amLODipine  10 mg Oral Daily  . aspirin EC  81 mg Oral Daily  . calcitRIOL  0.5 mcg Oral Daily  . cloNIDine  0.2 mg Oral TID  . colchicine  0.6 mg Oral Daily  . desmopressin  0.2 mg Oral BID  . dexamethasone  1.5 mg Oral Daily  . enoxaparin (LOVENOX) injection  40 mg Subcutaneous Q24H  . guaiFENesin  600 mg Oral BID  . isosorbide-hydrALAZINE  1 tablet Oral TID  . levETIRAcetam  250 mg Oral BID  . levothyroxine  125 mcg Oral QAC breakfast  . mouth rinse  15 mL Mouth Rinse BID  . mometasone-formoterol  2 puff Inhalation BID  . phenytoin  100 mg Oral TID  . potassium chloride SA  20 mEq Oral Daily  . simvastatin  20 mg Oral q1800  . sodium chloride flush  3 mL Intravenous Q12H  . tamsulosin  0.4 mg Oral QPC supper  . tiotropium  1 capsule Inhalation Daily   Continuous Infusions: . sodium chloride    . furosemide (LASIX) infusion 6 mg/hr (10/14/17 0910)   PRN Meds: sodium chloride, acetaminophen, albuterol, diphenoxylate-atropine, nitroGLYCERIN, ondansetron (ZOFRAN) IV, sodium chloride flush   Vital Signs    Vitals:   10/15/17 0553 10/15/17 0827 10/15/17 0835 10/15/17 0839  BP: (!) 126/59     Pulse: 88     Resp: 18     Temp: 98.6 F (37 C)     TempSrc: Oral     SpO2: 99% 94% 94% 94%  Weight: 259 lb 14.4 oz (117.9 kg)     Height:        Intake/Output Summary (Last 24 hours) at 10/15/2017 0931 Last data filed at 10/15/2017 0600 Gross per 24 hour  Intake 885.67 ml  Output 1300 ml  Net -414.33 ml   Filed Weights   10/13/17 0643 10/14/17 0500 10/15/17 0553  Weight: 254 lb (115.2 kg) 256 lb (116.1 kg) 259 lb 14.4 oz (117.9 kg)    Telemetry    Sinus rhythm with frequent PAC's   Personally Reviewed  Physical Exam   GEN: No acute distress.  Chronically ill appearing. Neck: No JVD Cardiac: RRR, 1/6 systolic  murmurs, rubs, or gallops.  Respiratory: Clear to auscultation bilaterally. GI:  Protuberant, nontender.  MS: 2+ leg edema; No deformity. Neuro:  Nonfocal  Psych: Flat affect   Labs    Chemistry Recent Labs  Lab 10/11/17 1831  10/13/17 0448 10/14/17 0402 10/15/17 0530  NA  --    < > 140 136 138  K  --    < > 4.3 4.1 4.3  CL  --    < > 106 105 105  CO2  --    < > 23 21* 25  GLUCOSE  --    < > 103* 101* 103*  BUN  --    < > 66* 68* 67*  CREATININE  --    < > 2.80* 2.70* 2.68*  CALCIUM  --    < > 8.6* 8.3* 8.3*  PROT 6.5  --   --   --   --   ALBUMIN 3.5  --   --   --   --  AST 44*  --   --   --   --   ALT 48  --   --   --   --   ALKPHOS 62  --   --   --   --   BILITOT 0.6  --   --   --   --   GFRNONAA  --    < > 21* 22* 22*  GFRAA  --    < > 24* 25* 26*  ANIONGAP  --    < > 11 10 8    < > = values in this interval not displayed.     Hematology Recent Labs  Lab 10/11/17 1840 10/12/17 0701  WBC 5.4 4.6  RBC 3.21* 3.24*  HGB 9.6* 9.7*  HCT 31.1* 31.2*  MCV 96.9 96.3  MCH 29.9 29.9  MCHC 30.9 31.1  RDW 16.6* 16.6*  PLT 134* 143*    Cardiac Enzymes Recent Labs  Lab 10/11/17 1840 10/12/17 0048 10/12/17 0701  TROPONINI 0.03* 0.04* 0.03*   No results for input(s): TROPIPOC in the last 168 hours.   BNP Recent Labs  Lab 10/11/17 1840  BNP 2,661.0*     Radiology    No results found.  Cardiac Studies   Echocardiogram 08/22/2017: Study Conclusions  - Left ventricle: The cavity size was moderately dilated. Wall thickness was increased in a pattern of moderate LVH. Systolic function was mildly reduced. The estimated ejection fraction was in the range of 45% to 50%. Doppler parameters are consistent with restrictive physiology, indicative of decreased left ventricular diastolic compliance and/or increased left  atrial pressure. Doppler parameters are consistent with high ventricular filling pressure. - Regional wall motion abnormality: Hypokinesis of the basal inferior and basal inferolateral myocardium; mild hypokinesis of the mid anterolateral and apical lateral myocardium. Basal anterior hypokinesis. - Aortic valve: Severely calcified annulus. Trileaflet. There was severe regurgitation. Regurgitation pressure half-time: 168 ms. - Aorta: Moderate aortic root enlargement. Aortic root dimension: 46 mm (ED). - Mitral valve: Mildly calcified annulus. Normal thickness leaflets . There was moderate to severe regurgitation. - Left atrium: The atrium was moderately to severely dilated. - Right atrium: The atrium was moderately dilated. - Tricuspid valve: There was moderate-severe regurgitation. - Pulmonary arteries: Systolic pressure was severely increased. PA peak pressure: 72 mm Hg (S). - Systemic veins: Dilated IVC with normal respiratory variation. Estimated CVP 8 mmHg.  Patient Profile     71 y.o. male with a history of severe aortic regurgitation felt to be poor surgical candidate, chronic combined heart failure with LVEF 45-50%, dilated ascending aorta, minor coronary atherosclerosis based on cardiac catheterization in 2016, type 2 diabetes mellitus, of your COPD and restrictive lung disease with pulmonary hypertension, and CKD stage IV.  [name] admitted with acute on chronic combined heart failure and volume overload. [name] on lasix gtt. Plan is for Hospice on discharge.   Assessment & Plan    1. Acute on Chronic Combined CHF: On lasix gtt at 6 mg hour. 474 urine output overnight. Total urine output 3.7 liters since admission.  Weight is up from 256 lbs to 259 lbs since yesterday. Creatinine 2.68, CO2 25. Consider increasing lasix gtt to 8 mg hour with weight gain and decreased urine output, with evidence of continued fluid overload.   2. Severe AoV Regurgitation and  Insufficiency: Poor surgical candidate in the setting of severe COPD and pulmonary hypertension. Significantly impacting his life expectancy and decompensation of CHF. [name] being seen by Palliative Care,  now made DNR with plan for Hospice care.  3. Hx of CAD: Continuing medical management.   4. CKD Stage IV: creatinine stable currently.   5.Chronic Anemia: Hgb remains in the 9.7 range.   Signed, Phill Myron. West Pugh, ANP, AACC   10/15/2017, 9:31 AM     Attending note:  Patient seen and examined.  Reviewed interval hospital course and discussed the case with Ms. West Pugh.  Urine output has dropped off on Lasix infusion although creatinine remains stable.  This morning the patient reports continued leg swelling, less abdominal distention.  Still with poor appetite.  Lungs exhibit decreased breath sounds.  Cardiac exam with RRR and 2/6 systolic and diastolic murmurs.  2+ leg edema noted.  Lab work shows creatinine 2.68,  potassium 4.3.  Continue management of acute on chronic combined heart failure with severe aortic regurgitation and volume overload.  Lasix infusion will be increased to 8 mg/h.  May need to consider adding intermittent doses of metolazone depending on urine output.  Palliative Care team involved with plan for transition to hospice care ultimately.  Satira Sark, M.D., F.A.C.C.

## 2017-10-15 NOTE — Care Management (Signed)
Referral faxed to [facility].

## 2017-10-16 LAB — BASIC METABOLIC PANEL
ANION GAP: 11 (ref 5–15)
BUN: 72 mg/dL — ABNORMAL HIGH (ref 6–20)
CHLORIDE: 107 mmol/L (ref 101–111)
CO2: 17 mmol/L — AB (ref 22–32)
Calcium: 8.3 mg/dL — ABNORMAL LOW (ref 8.9–10.3)
Creatinine, Ser: 2.73 mg/dL — ABNORMAL HIGH (ref 0.61–1.24)
GFR calc Af Amer: 25 mL/min — ABNORMAL LOW (ref >60)
GFR, EST NON AFRICAN AMERICAN: 22 mL/min — AB (ref >60)
GLUCOSE: 88 mg/dL (ref 65–99)
POTASSIUM: 4.6 mmol/L (ref 3.5–5.1)
Sodium: 135 mmol/L (ref 135–145)

## 2017-10-16 LAB — GLUCOSE, CAPILLARY
GLUCOSE-CAPILLARY: 97 mg/dL (ref 65–99)
Glucose-Capillary: 97 mg/dL (ref 65–99)

## 2017-10-16 MED ORDER — TORSEMIDE 20 MG PO TABS: 80.0000 mg | ORAL_TABLET | Freq: Every day | ORAL | 0 refills | Status: AC

## 2017-10-16 MED ORDER — TORSEMIDE 20 MG PO TABS: 60.0000 mg | ORAL_TABLET | Freq: Every evening | ORAL | Status: DC

## 2017-10-16 MED ORDER — TORSEMIDE 20 MG PO TABS: 80.0000 mg | ORAL_TABLET | Freq: Once | ORAL | Status: DC

## 2017-10-16 MED ORDER — TORSEMIDE 20 MG PO TABS: 80.0000 mg | ORAL_TABLET | Freq: Every day | ORAL | Status: DC

## 2017-10-16 MED ORDER — TORSEMIDE 20 MG PO TABS: 60.0000 mg | ORAL_TABLET | Freq: Every evening | ORAL | 0 refills | Status: AC

## 2017-10-16 MED ORDER — IPRATROPIUM-ALBUTEROL 0.5-2.5 (3) MG/3ML IN SOLN: 3.0000 mL | Freq: Four times a day (QID) | RESPIRATORY_TRACT | 0 refills | Status: AC | PRN

## 2017-10-16 MED ORDER — TORSEMIDE 20 MG PO TABS
80.0000 mg | ORAL_TABLET | Freq: Once | ORAL | Status: AC
  Administered 2017-10-16: 80 mg via ORAL
  Filled 2017-10-16: qty 4

## 2017-10-16 NOTE — Discharge Summary (Signed)
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)   COPD (chronic obstructive pulmonary disease) (HCC)   Acute on chronic combined systolic and diastolic CHF (congestive heart failure) (HCC)   CKD (chronic kidney disease) stage 4, GFR 15-29 ml/min (HCC)   Congestive heart failure (CHF) (HCC)   Palliative care encounter   Goals of care, counseling/discussion   DNR (do not resuscitate) discussion   Encounter for hospice care discussion   Discharge Condition: stable   Diet recommendation: low sodium   Filed Weights   10/14/17 0500 10/15/17 0553 10/16/17 0628  Weight: 116.1 kg (256 lb) 117.9 kg (259 lb 14.4 oz) 117.5 kg (259 lb 1.6 oz)    History of present illness:  71 year old male with Chronic combined systolic and diastolic CHF, COPD With recent hospitalization,chronic kidney disease stage IV, hypothyroidism, type 2 diabetes mellitus, hypertension and aortic insufficiency And recurrent hospitalizations Presented with exertional substernal chest pain with findings of acute on chronic combined systolic and diastolic CHF.  Hospital Course:   Acute on chronic combined systolic and diastolic CHF (congestive heart failure) ([place]). Severe pulmonary HTN. Aortic regurgitation, MR. systolic HF. Right heart failure. No significant Improvement with IV Lasix twice a day. Then transitioned to iv Lasix drip by cardiology.  Recent 2-D echo with EF of 45-50%. Continue aspirin, statin.Monitor strict I/O and daily  weight. Monitor daily Lites. -end stage heart failure. Transitioned to oral palliative diuresis. Patient is being discharged home with hospice  COPD (chronic obstructive pulmonary disease) ([place]) No signs of acute exacerbation. Continue when necessary home Nebs and inhalers.  Elevated troponin. Mild and likely demand ischemia with associated CHF. No signs of ACS.  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 Desmopressin 0.2 mg p.o. twice daily.Decadron 1.5 mg p.o. Daily. Testosterone injections as scheduled   CKD (chronic kidney disease) stage 4, GFR 15-29 ml/min ([place]). Renal function at baseline. Monitor Was on Lasix drip  Seizure disorder. Continue Keppra and phenytoin.   Diabetes mellitus type 2. Monitor on sliding scale coverage   Goals of care. Palliative care consult appreciated. Plan for home with home hospice .    Procedures:  Echo  (i.e. Studies not automatically included, echos, thoracentesis, etc; not x-rays)  Consultations:  Palliative   Cardiology   Discharge Exam: Vitals:   10/16/17 0628 10/16/17 0826  BP: (!) 125/54   Pulse: 86   Resp: 18   Temp: 98.3 F (36.8 C)   SpO2: 98% 98%    General: alert. No distress  Cardiovascular: K2,H0 systolic. + murmur  Respiratory: few crackles LL  Discharge Instructions  Discharge Instructions    Diet - low sodium heart healthy   Complete by:  As directed    Increase activity slowly   Complete by:  As directed      Allergies as of 10/16/2017   No Known Allergies     Medication List    STOP taking these medications   loperamide 2 MG capsule Commonly known as:  IMODIUM     TAKE these medications   acetaminophen 325  MG tablet Commonly known as:  TYLENOL Take 650 mg by mouth every 6 (six) hours as needed for mild pain.   ADVAIR DISKUS 250-50 MCG/DOSE Aepb Generic drug:  Fluticasone-Salmeterol Inhale 1 puff into the lungs daily.   albuterol (2.5 MG/3ML) 0.083% nebulizer  solution Commonly known as:  PROVENTIL Take 3 mLs (2.5 mg total) by nebulization every 4 (four) hours as needed for wheezing or shortness of breath.   amLODipine 10 MG tablet Commonly known as:  NORVASC TAKE ONE TABLET BY MOUTH DAILY.   aspirin EC 81 MG tablet Take 81 mg by mouth daily.   calcitRIOL 0.5 MCG capsule Commonly known as:  ROCALTROL Take 0.5 mcg by mouth daily.   cloNIDine 0.2 MG tablet Commonly known as:  CATAPRES TAKE (1) TABLET BY MOUTH (3) TIMES DAILY   colchicine 0.6 MG tablet Take 1 tablet (0.6 mg total) by mouth daily.   desmopressin 0.2 MG tablet Commonly known as:  DDAVP Take 1 tablet (0.2 mg total) by mouth 2 (two) times daily.   dexamethasone 1.5 MG tablet Commonly known as:  DECADRON Take 1.5 mg by mouth daily.   diphenoxylate-atropine 2.5-0.025 MG tablet Commonly known as:  LOMOTIL Take 1 tablet by mouth every 6 (six) hours as needed for diarrhea or loose stools.   guaiFENesin 600 MG 12 hr tablet Commonly known as:  MUCINEX Take 1 tablet (600 mg total) by mouth 2 (two) times daily.   ipratropium-albuterol 0.5-2.5 (3) MG/3ML Soln Commonly known as:  DUONEB Take 3 mLs every 6 (six) hours as needed by nebulization (shortness of breath). And additional every four hours as needed What changed:    when to take this  reasons to take this   isosorbide-hydrALAZINE 20-37.5 MG tablet Commonly known as:  BIDIL Take 1 tablet by mouth 3 (three) times daily.   levETIRAcetam 250 MG tablet Commonly known as:  KEPPRA Take 1 tablet (250 mg total) by mouth 2 (two) times daily.   levothyroxine 125 MCG tablet Commonly known as:  SYNTHROID, LEVOTHROID Take 125 mcg by mouth daily before breakfast.   phenytoin 100 MG ER capsule Commonly known as:  DILANTIN Take 100 mg by mouth 3 (three) times daily.   potassium chloride SA 20 MEQ tablet Commonly known as:  K-DUR,KLOR-CON Take 1 tablet daily by mouth.   simvastatin 20 MG tablet Commonly known as:   ZOCOR Take 20 mg by mouth daily at 6 PM.   SPIRIVA HANDIHALER 18 MCG inhalation capsule Generic drug:  tiotropium Place 1 capsule into inhaler and inhale daily.   tamsulosin 0.4 MG Caps capsule Commonly known as:  FLOMAX Take 1 capsule (0.4 mg total) by mouth daily after supper.   testosterone cypionate 200 MG/ML injection Commonly known as:  DEPOTESTOSTERONE CYPIONATE Inject 200 mg into the muscle every 28 (twenty-eight) days.   torsemide 20 MG tablet Commonly known as:  DEMADEX Take 3 tablets (60 mg total) every evening by mouth. What changed:  when to take this   torsemide 20 MG tablet Commonly known as:  DEMADEX Take 4 tablets (80 mg total) daily with breakfast by mouth. Start taking on:  10/17/2017 What changed:  You were already taking a medication with the same name, and this prescription was added. Make sure you understand how and when to take each.      No Known Allergies    The results of significant diagnostics from this hospitalization (including imaging, microbiology, ancillary and laboratory) are listed below for reference.    Significant  Diagnostic Studies: Dg Chest 2 View  Result Date: 10/11/2017 CLINICAL DATA:  Initial evaluation for acute mid chest pain. History of CHF, hypertension. EXAM: CHEST  2 VIEW COMPARISON:  Prior radiograph from 09/18/2017. FINDINGS: Advanced cardiomegaly, stable. Mediastinal silhouette within normal limits. Lungs normally inflated. Perihilar vascular congestion with diffuse interstitial prominence, suggesting mild pulmonary interstitial edema. Small bilateral pleural effusions are suspected. No definite focal infiltrates. No pneumothorax. No acute osseus abnormality. IMPRESSION: Cardiomegaly with mild diffuse pulmonary interstitial edema with suspected small bilateral pleural effusions. Findings suggestive of acute CHF exacerbation. Electronically Signed   By: Jeannine Boga M.D.   On: 10/11/2017 19:26   Dg Chest 2  View  Result Date: 09/18/2017 CLINICAL DATA:  Increasing chest pain shortness of appears increasing left chest pain and shortness of breath since yesterday. EXAM: CHEST  2 VIEW COMPARISON:  PA and lateral chest 08/21/2017 and 09/10/2017. CT chest 12/17/2016. FINDINGS: There is marked cardiomegaly without edema. No consolidative process, pneumothorax or effusion. No acute bony abnormality. IMPRESSION: Cardiomegaly without acute disease. Electronically Signed   By: Inge Rise M.D.   On: 09/18/2017 17:37    Microbiology: No results found for this or any previous visit (from the past 240 hour(s)).   Labs: Basic Metabolic Panel: Recent Labs  Lab 10/11/17 1840 10/12/17 0701 10/13/17 0448 10/14/17 0402 10/15/17 0530 10/16/17 0529  NA 147* 142 140 136 138 135  K 4.0 4.4 4.3 4.1 4.3 4.6  CL 111 107 106 105 105 107  CO2 24 23 23  21* 25 17*  GLUCOSE 89 118* 103* 101* 103* 88  BUN 58* 60* 66* 68* 67* 72*  CREATININE 2.82* 2.79* 2.80* 2.70* 2.68* 2.73*  CALCIUM 8.7* 8.6* 8.6* 8.3* 8.3* 8.3*  MG 2.3  --   --   --   --   --    Liver Function Tests: Recent Labs  Lab 10/11/17 1831  AST 44*  ALT 48  ALKPHOS 62  BILITOT 0.6  PROT 6.5  ALBUMIN 3.5   No results for input(s): LIPASE, AMYLASE in the last 168 hours. No results for input(s): AMMONIA in the last 168 hours. CBC: Recent Labs  Lab 10/11/17 1840 10/12/17 0701  WBC 5.4 4.6  HGB 9.6* 9.7*  HCT 31.1* 31.2*  MCV 96.9 96.3  PLT 134* 143*   Cardiac Enzymes: Recent Labs  Lab 10/11/17 1840 10/12/17 0048 10/12/17 0701  TROPONINI 0.03* 0.04* 0.03*   BNP: BNP (last 3 results) Recent Labs    09/10/17 2112 09/18/17 2022 10/11/17 1840  BNP 1,127.0* 2,356.0* 2,661.0*    ProBNP (last 3 results) No results for input(s): PROBNP in the last 8760 hours.  CBG: Recent Labs  Lab 10/14/17 2127 10/15/17 0759 10/15/17 1124 10/15/17 2238 10/16/17 0721  GLUCAP 108* 89 89 124* 97       Signed:  Johaan Ryser  N  [facility] 10/16/2017, 10:32 AM

## 2017-10-16 NOTE — Progress Notes (Signed)
[facility] PROGRESS NOTE  [name] [name] NWG:[id number] DOB: 05-04-1946 DOA: 10/11/2017 PCP: [name], MD  Brief summary   71 year old male with Chronic combined systolic and diastolic CHF, COPD With recent hospitalization,chronic kidney disease stage IV, hypothyroidism, type 2 diabetes mellitus, hypertension and aortic insufficiency And recurrent hospitalizations Presented with exertional substernal chest pain with findings of acute on chronic combined systolic and diastolic CHF.  Assessment/Plan:  Acute on chronic combined systolic and diastolic CHF (congestive heart failure) ([place]). Severe pulmonary HTN. Aortic regurgitation, MR. systolic HF. Right heart failure. No significant Improvement with IV Lasix twice a day. Then transitioned to iv Lasix drip by cardiology.  Recent 2-D echo with EF of 45-50%. Continue aspirin, statin.Monitor strict I/O and daily weight. Monitor daily Lites. -end stage heart failure. Transitioned to oral palliative diuresis. Patient is being discharged home with hospice  COPD (chronic obstructive pulmonary disease) ([place]) No signs of acute exacerbation. Continue when necessary home Nebs and inhalers.  Elevated troponin. Mild and likely demand ischemia with associated CHF. No signs of ACS.  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 Desmopressin 0.2 mg p.o. twice daily.Decadron 1.5 mg p.o. Daily. Testosterone injections as scheduled   CKD (chronic kidney disease) stage 4, GFR 15-29 ml/min ([place]). Renal function at baseline. Monitor Was on Lasix drip  Seizure disorder. Continue Keppra and phenytoin.   Diabetes mellitus type 2. Monitor on sliding scale coverage   Goals of care. Palliative care consult appreciated. Plan on home with home hospice .   Code Status: DNR Family Communication: d/w patient, RN. Cardiology.  (indicate person spoken with, relationship, and if by phone, the number) Disposition Plan: hospice  home   Consultants:  Cardiology  Palliative care   Procedures:  none  Antibiotics:  none (indicate start date, and stop date if known)  HPI/Subjective: Alert. Reports chronic dyspnea. On iv lasix 3  Objective: Vitals:   10/16/17 0628 10/16/17 0826  BP: (!) 125/54   Pulse: 86   Resp: 18   Temp: 98.3 F (36.8 C)   SpO2: 98% 98%    Intake/Output Summary (Last 24 hours) at 10/16/2017 1031 Last data filed at 10/16/2017 0600 Gross per 24 hour  Intake 51.57 ml  Output 1800 ml  Net -1748.43 ml   Filed Weights   10/14/17 0500 10/15/17 0553 10/16/17 0628  Weight: 116.1 kg (256 lb) 117.9 kg (259 lb 14.4 oz) 117.5 kg (259 lb 1.6 oz)    Exam:   General:  No distress   Cardiovascular: V7,Q4 rrr. systolic and diastolic mr   Respiratory: BL crackles LL  Abdomen: soft, obese, nt   Musculoskeletal: + leg edema    Data Reviewed: Basic Metabolic Panel: Recent Labs  Lab 10/11/17 1840 10/12/17 0701 10/13/17 0448 10/14/17 0402 10/15/17 0530 10/16/17 0529  NA 147* 142 140 136 138 135  K 4.0 4.4 4.3 4.1 4.3 4.6  CL 111 107 106 105 105 107  CO2 24 23 23  21* 25 17*  GLUCOSE 89 118* 103* 101* 103* 88  BUN 58* 60* 66* 68* 67* 72*  CREATININE 2.82* 2.79* 2.80* 2.70* 2.68* 2.73*  CALCIUM 8.7* 8.6* 8.6* 8.3* 8.3* 8.3*  MG 2.3  --   --   --   --   --    Liver Function Tests: Recent Labs  Lab 10/11/17 1831  AST 44*  ALT 48  ALKPHOS 62  BILITOT 0.6  PROT 6.5  ALBUMIN 3.5   No results for input(s): LIPASE, AMYLASE in the last 168 hours.  No results for input(s): AMMONIA in the last 168 hours. CBC: Recent Labs  Lab 10/11/17 1840 10/12/17 0701  WBC 5.4 4.6  HGB 9.6* 9.7*  HCT 31.1* 31.2*  MCV 96.9 96.3  PLT 134* 143*   Cardiac Enzymes: Recent Labs  Lab 10/11/17 1840 10/12/17 0048 10/12/17 0701  TROPONINI 0.03* 0.04* 0.03*   BNP (last 3 results) Recent Labs    09/10/17 2112 09/18/17 2022 10/11/17 1840  BNP 1,127.0* 2,356.0* 2,661.0*    ProBNP  (last 3 results) No results for input(s): PROBNP in the last 8760 hours.  CBG: Recent Labs  Lab 10/14/17 2127 10/15/17 0759 10/15/17 1124 10/15/17 2238 10/16/17 0721  GLUCAP 108* 89 89 124* 97    No results found for this or any previous visit (from the past 240 hour(s)).   Studies: No results found.  Scheduled Meds: . albuterol  2.5 mg Nebulization TID  . amLODipine  10 mg Oral Daily  . aspirin EC  81 mg Oral Daily  . calcitRIOL  0.5 mcg Oral Daily  . cloNIDine  0.2 mg Oral TID  . colchicine  0.6 mg Oral Daily  . desmopressin  0.2 mg Oral BID  . dexamethasone  1.5 mg Oral Daily  . enoxaparin (LOVENOX) injection  40 mg Subcutaneous Q24H  . guaiFENesin  600 mg Oral BID  . isosorbide-hydrALAZINE  1 tablet Oral TID  . levETIRAcetam  250 mg Oral BID  . levothyroxine  125 mcg Oral QAC breakfast  . mouth rinse  15 mL Mouth Rinse BID  . mometasone-formoterol  2 puff Inhalation BID  . phenytoin  100 mg Oral TID  . potassium chloride SA  20 mEq Oral Daily  . simvastatin  20 mg Oral q1800  . sodium chloride flush  3 mL Intravenous Q12H  . tamsulosin  0.4 mg Oral QPC supper  . tiotropium  1 capsule Inhalation Daily  . torsemide  80 mg Oral Once   [name]  . [START ON 10/17/2017] torsemide  60 mg Oral QPM   Continuous Infusions: . 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 If 7PM-7AM, please contact night-coverage at [email or web address], password [facility] 10/16/2017, 10:31 AM  LOS: 3 days

## 2017-10-16 NOTE — Care Management Note (Signed)
Case Management Note  Patient Details  Name: [name] [name] MRN: [id number] Date of Birth: 1945/12/25  Expected Discharge Date:  10/16/17               Expected Discharge Plan:  Home w Hospice Care  In-House Referral:  Nutrition, Hospice / Palliative Care  Discharge planning Services  CM Consult  Post Acute Care Choice:  NA Choice offered to:  NA  Status of Service:  Completed, signed off  Additional Comments: Discharging home today. Agreeable to hospice at home, appears anxious about DC. When asked about it he said does not feel he is ready to go home, his legs are still swollen despite an extreme amount of fluid loss even since yesterday. Pt would like for me to call his dtr about getting home, dtr feels he needs EMS, she will meet them at the house. EMS arranged for 1330. DC summary faxed to [facility] and CM will notify them once pt leaves hospital so that they can admit to hospice services today.   Sherald Barge, RN 10/16/2017, 12:25 PM

## 2017-10-16 NOTE — Progress Notes (Signed)
Patient is to be discharged home and in stable condition. Patient's IV and telemetry removed, WNL. Patient given discharge instructions and verbalized understanding.  Celestia Khat, RN

## 2017-10-16 NOTE — Care Management Important Message (Signed)
Important Message  Patient Details  Name: [name] [name] MRN: [id number] Date of Birth: 01-Apr-1946   Medicare Important Message Given:  Yes    Sherald Barge, RN 10/16/2017, 12:31 PM

## 2017-10-16 NOTE — Progress Notes (Signed)
Progress Note  Patient Name: [name] [name] Date of Encounter: 10/16/2017  Primary Cardiologist: Dr. Kate Sable   Subjective   Tired. Breathing about the same.   Inpatient Medications    Scheduled Meds: . albuterol  2.5 mg Nebulization TID  . amLODipine  10 mg Oral Daily  . aspirin EC  81 mg Oral Daily  . calcitRIOL  0.5 mcg Oral Daily  . cloNIDine  0.2 mg Oral TID  . colchicine  0.6 mg Oral Daily  . desmopressin  0.2 mg Oral BID  . dexamethasone  1.5 mg Oral Daily  . enoxaparin (LOVENOX) injection  40 mg Subcutaneous Q24H  . guaiFENesin  600 mg Oral BID  . isosorbide-hydrALAZINE  1 tablet Oral TID  . levETIRAcetam  250 mg Oral BID  . levothyroxine  125 mcg Oral QAC breakfast  . mouth rinse  15 mL Mouth Rinse BID  . mometasone-formoterol  2 puff Inhalation BID  . phenytoin  100 mg Oral TID  . potassium chloride SA  20 mEq Oral Daily  . simvastatin  20 mg Oral q1800  . sodium chloride flush  3 mL Intravenous Q12H  . tamsulosin  0.4 mg Oral QPC supper  . tiotropium  1 capsule Inhalation Daily  . torsemide  80 mg Oral Once   [name]  . [START ON 10/17/2017] torsemide  60 mg Oral QPM   Continuous Infusions: . sodium chloride     PRN Meds: sodium chloride, acetaminophen, albuterol, diphenoxylate-atropine, nitroGLYCERIN, ondansetron (ZOFRAN) IV, sodium chloride flush   Vital Signs    Vitals:   10/15/17 2040 10/15/17 2234 10/16/17 0628 10/16/17 0826  BP:  (!) 124/51 (!) 125/54   Pulse:  84 86   Resp:  20 18   Temp:  98.1 F (36.7 C) 98.3 F (36.8 C)   TempSrc:  Oral Oral   SpO2: 97% 100% 98% 98%  Weight:   259 lb 1.6 oz (117.5 kg)   Height:        Intake/Output Summary (Last 24 hours) at 10/16/2017 1029 Last data filed at 10/16/2017 0600 Gross per 24 hour  Intake 51.57 ml  Output 1800 ml  Net -1748.43 ml   Filed Weights   10/14/17 0500 10/15/17 0553 10/16/17 0628  Weight: 256 lb (116.1 kg) 259 lb 14.4 oz (117.9 kg) 259 lb 1.6 oz (117.5 kg)     Telemetry    Sinus rhythm with PACs.  Personally Reviewed  Physical Exam   GEN:  Chronically ill-appearing male.  No acute distress.   Neck: No JVD Cardiac: RRR, 2/6 systolic and diastolic murmur, no gallop. Respiratory: Clear to auscultation bilaterally. GI:  Protuberant, nontender, distended  MS: 1-2+ legedema  No deformity. Neuro:  Nonfocal  Psych: Normal affect   Labs    Chemistry Recent Labs  Lab 10/11/17 1831  10/14/17 0402 10/15/17 0530 10/16/17 0529  NA  --    < > 136 138 135  K  --    < > 4.1 4.3 4.6  CL  --    < > 105 105 107  CO2  --    < > 21* 25 17*  GLUCOSE  --    < > 101* 103* 88  BUN  --    < > 68* 67* 72*  CREATININE  --    < > 2.70* 2.68* 2.73*  CALCIUM  --    < > 8.3* 8.3* 8.3*  PROT 6.5  --   --   --   --  ALBUMIN 3.5  --   --   --   --   AST 44*  --   --   --   --   ALT 48  --   --   --   --   ALKPHOS 62  --   --   --   --   BILITOT 0.6  --   --   --   --   GFRNONAA  --    < > 22* 22* 22*  GFRAA  --    < > 25* 26* 25*  ANIONGAP  --    < > 10 8 11    < > = values in this interval not displayed.     Hematology Recent Labs  Lab 10/11/17 1840 10/12/17 0701  WBC 5.4 4.6  RBC 3.21* 3.24*  HGB 9.6* 9.7*  HCT 31.1* 31.2*  MCV 96.9 96.3  MCH 29.9 29.9  MCHC 30.9 31.1  RDW 16.6* 16.6*  PLT 134* 143*    Cardiac Enzymes Recent Labs  Lab 10/11/17 1840 10/12/17 0048 10/12/17 0701  TROPONINI 0.03* 0.04* 0.03*   No results for input(s): TROPIPOC in the last 168 hours.   BNP Recent Labs  Lab 10/11/17 1840  BNP 2,661.0*     Radiology    No results found.  Cardiac Studies   Echocardiogram 08/22/2017: Study Conclusions  - Left ventricle: The cavity size was moderately dilated. Wall thickness was increased in a pattern of moderate LVH. Systolic function was mildly reduced. The estimated ejection fraction was in the range of 45% to 50%. Doppler parameters are consistent with restrictive physiology, indicative of  decreased left ventricular diastolic compliance and/or increased left atrial pressure. Doppler parameters are consistent with high ventricular filling pressure. - Regional wall motion abnormality: Hypokinesis of the basal inferior and basal inferolateral myocardium; mild hypokinesis of the mid anterolateral and apical lateral myocardium. Basal anterior hypokinesis. - Aortic valve: Severely calcified annulus. Trileaflet. There was severe regurgitation. Regurgitation pressure half-time: 168 ms. - Aorta: Moderate aortic root enlargement. Aortic root dimension: 46 mm (ED). - Mitral valve: Mildly calcified annulus. Normal thickness leaflets . There was moderate to severe regurgitation. - Left atrium: The atrium was moderately to severely dilated. - Right atrium: The atrium was moderately dilated. - Tricuspid valve: There was moderate-severe regurgitation. - Pulmonary arteries: Systolic pressure was severely increased. PA peak pressure: 72 mm Hg (S). - Systemic veins: Dilated IVC with normal respiratory variation. Estimated CVP 8 mmHg.  Patient Profile     71 y.o. male  with a history of severe aortic regurgitation felt to be poor surgical candidate, chronic combined heart failure with LVEF 45-50%, dilated ascending aorta, minor coronary atherosclerosis based on cardiac catheterization in 2016, type 2 diabetes mellitus, of your COPD and restrictive lung diseasewithpulmonary hypertension, and CKD stage IV.[name] admitted with acute on chronic combined heart failure and volume overload. [name] on lasix gtt. Plan is for Hospice on discharge.   Assessment & Plan    1. Acute on Chronic Combined CHF: Lasix gtt increased to 8 mg hour yesterday, diuresed 1.800 overnight but weight is unchanged. Accuracy is questionable. Creatinine is rising to 2.78, from 2.68 yesterday. CO2 17. Will try and change to oral torsemide to enable him to go home with Hospice. [name] concerned that he  will not be able to walk to the bathroom without assistance. Hospice has been contacted by Case Manager.   From cardiology standpoint, he will be transitioned to po torsemide and  lasix gtt will be stopped. Uncertain if metolazone will be helpful with CKD, but will discuss this with Dr. Domenic Polite before recommendations for home medications on discharge. For now, will transition to po torsemide at 80 mg in am and 60 mg in pm. Continue isosorbide with hydralazine.    2. Severe AI: Significantly impacting his overall life expectancy with recurrent decompensations of CHF, breathing status, and ambulatory status. Hospice will need to address assistance at home for ADL's if family is unavailable to be with him at all times. Heart rate control medications may be of some help. Not on BB currently.   3. Hypertension: Controlled on current regimen.  4. CKD Stage IV: Creatinine rising. Will d/c IV lasix and begin torsemide 80 mg./60 mg daily.   5. Anemia: Stable.    Signed, Phill Myron. West Pugh, ANP, AACC   10/16/2017, 10:29 AM     Attending note:  Patient seen and examined.  Reviewed interval hospital course I discussed the case with Ms. West Pugh.  It is anticipated that the patient will go home with hospice, potentially later today.  Palliative Care team is following as well as Case Manager.  He has diuresed reasonably well on Lasix infusion, although creatinine is starting to bump up further.  On examination he appears chronically ill, lungs exhibit decreased breath sounds but no wheezing or labored breathing.  Cardiac exam reveals RRR with 2/6 systolic and diastolic murmurs.  He has had some improvement in his leg edema.  Lab work shows creatinine up to 2.73, BUN 72, potassium 4.6.  Continue treatment of acute on chronic combined heart failure associated with severe aortic regurgitation and volume overload.  We will try and transition from Lasix infusion to Demadex at 80 mg in the morning and  60 mg in the evening.  This can be further uptitrated if needed, or perhaps metolazone can be added.  Otherwise continue present cardiac regimen.  [name] being discharged to hospice care at home.  Satira Sark, M.D., F.A.C.C.

## 2017-10-20 ENCOUNTER — Other Ambulatory Visit: Payer: Self-pay | Admitting: *Deleted

## 2017-10-20 NOTE — Patient Outreach (Signed)
[facility]) Care Management  10/20/2017  ROBET CRUTCHFIELD 07-Sep-1946 [id number]   Transition of Care Referral  Referral Date: 10/20/17 Referral Source: [facility] Referral Date of Discharge: 10/16/17 Facility: [facility] Discharge Diagnosis: N/A Insurance: [employer]  Outreach attempt #1 to patient. No answer. RN CM left HIPAA compliant message along with contact info.     Plan: RN CM will contact patient within one week.    Lake Bells, RN, BSN, MHA/MSL, [place] Telephonic Care Manager Coordinator [facility] Direct Phone: [phone] Toll Free: [phone] Fax: [phone]

## 2017-10-21 ENCOUNTER — Other Ambulatory Visit: Payer: Self-pay | Admitting: *Deleted

## 2017-10-21 ENCOUNTER — Ambulatory Visit: Payer: Self-pay | Admitting: *Deleted

## 2017-10-21 NOTE — Patient Outreach (Signed)
[facility]) Care Management  10/21/2017  [name] [name] [birth year] [id number]   Transition of Care Referral  Referral Date: 10/20/17 Referral Source: [facility] Referral Date of Discharge: 10/16/17 Facility: [facility] Discharge Diagnosis: N/A Insurance: [facility]  Outreach telephone call to patient. HIPAA verified with patient. Patient gave RN CM permission to speak with his daughter [name]). [name] confirmed patient is with [facility]. RN CM informed Nadine patient is not eligible for [facility] services due to patient being enrolled with Hospice services.   Plan: RN CM will notify [facility] CM administrative assistant regarding case closure.    Lake Bells, RN, BSN, MHA/MSL, [place] Telephonic Care Manager Coordinator [facility] Direct Phone: [phone] Toll Free: [phone] Fax: [phone]

## 2017-10-26 ENCOUNTER — Other Ambulatory Visit: Payer: Self-pay | Admitting: "Endocrinology

## 2017-11-07 DEATH — deceased

## 2018-08-08 IMAGING — CT CT ABD-PELV W/O CM
2 of 4 series · 16 of 46 positions shown, 18 images · non-contrast
Comparison: 07/20/2016

CLINICAL DATA: Epigastric pain for 1 day, shortness of breath,
history hypertension, type II diabetes mellitus, COPD, chronic
systolic CHF, stage IV chronic kidney disease, former smoker,
panhypopituitarism

EXAM:
CT ABDOMEN AND PELVIS WITHOUT CONTRAST
TECHNIQUE: Multidetector CT imaging of the abdomen and pelvis was performed
following the standard protocol without IV contrast. Sagittal and
coronal MPR images reconstructed from axial data set.

[Series 2: axial st · axial · 0.95mm/px · z∈[+830,+1270]mm · 13 of 96 slices shown, 15 images]
[im 4/96  soft-tissue]
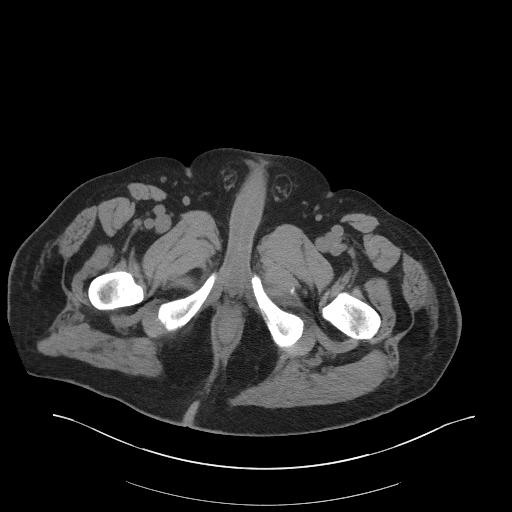
[im 4/96  bone]
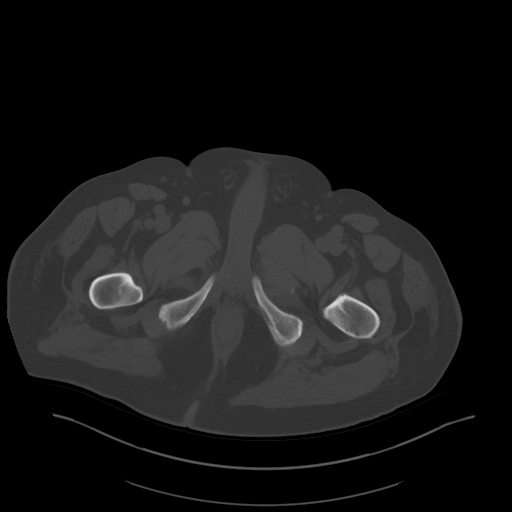
[im 12/96  soft-tissue]
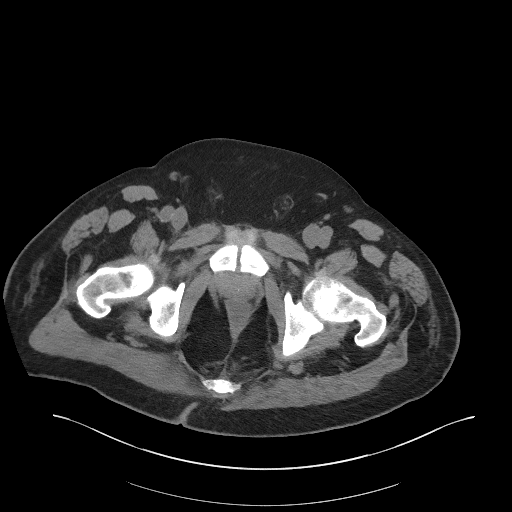
[im 20/96  soft-tissue]
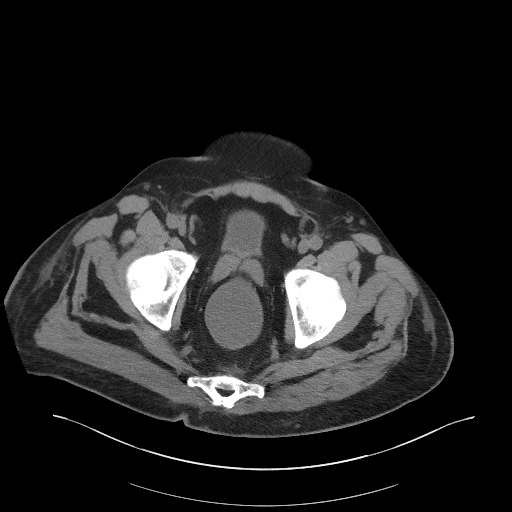
[im 27/96  soft-tissue]
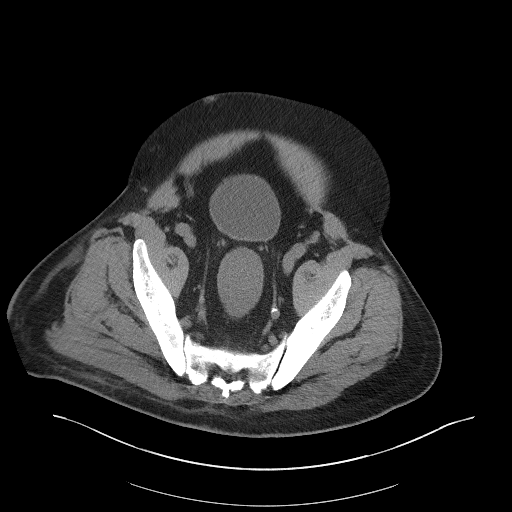
[im 35/96  soft-tissue]
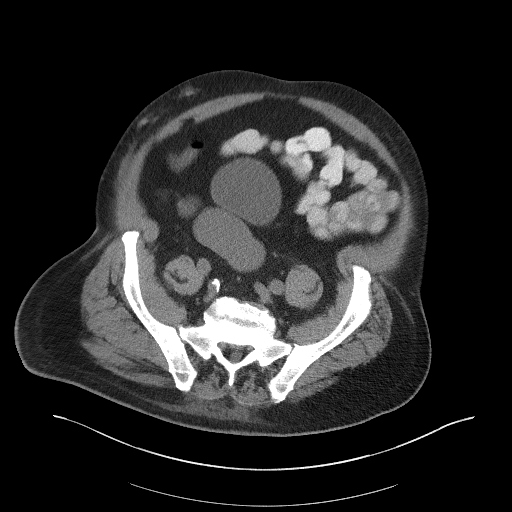
[im 42/96  soft-tissue]
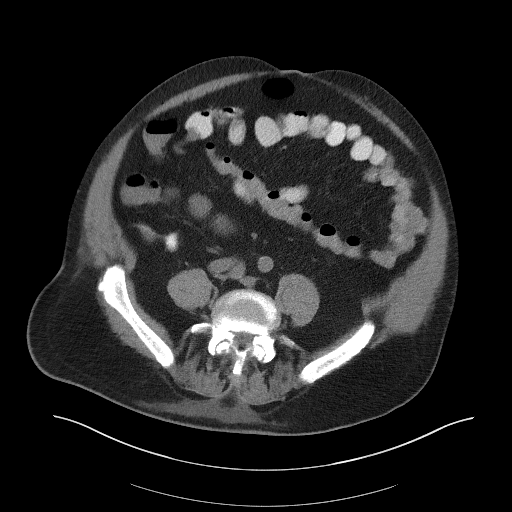
[im 50/96  soft-tissue]
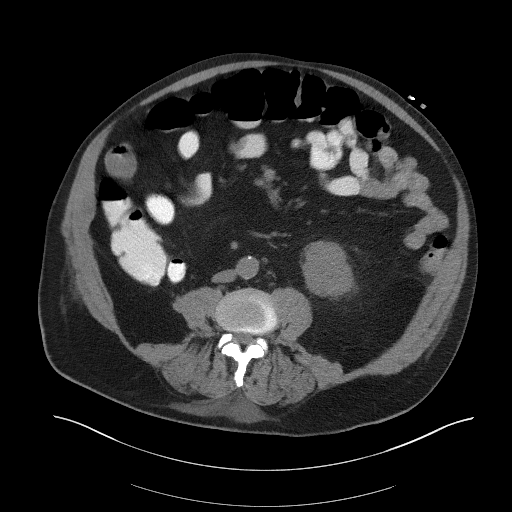
[im 54/96  soft-tissue]
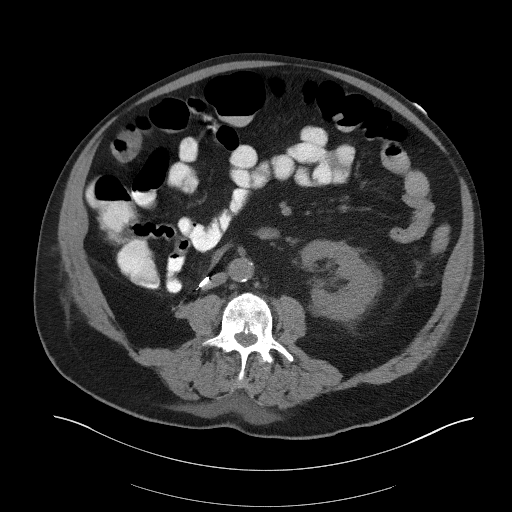
[im 61/96  soft-tissue]
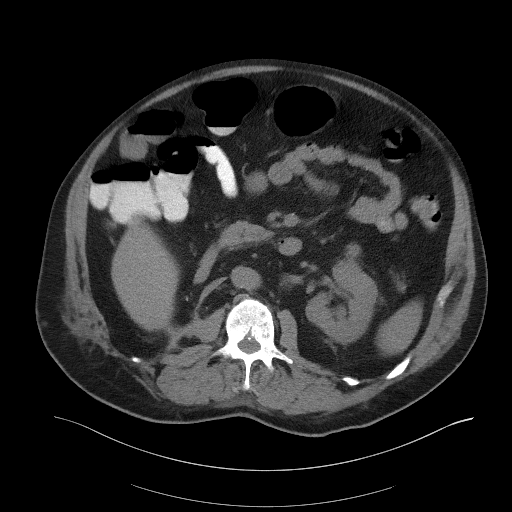
[im 61/96  bone]
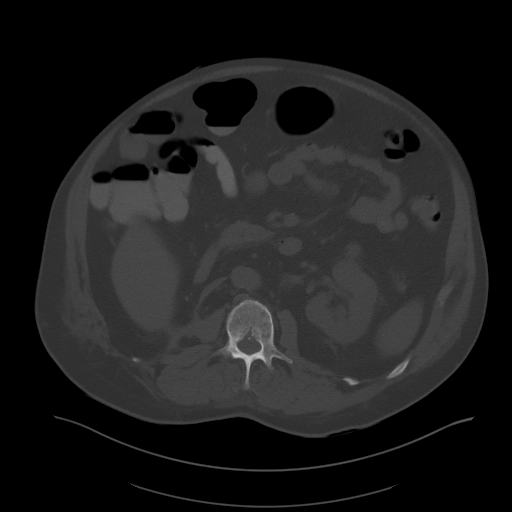
[im 69/96  soft-tissue]
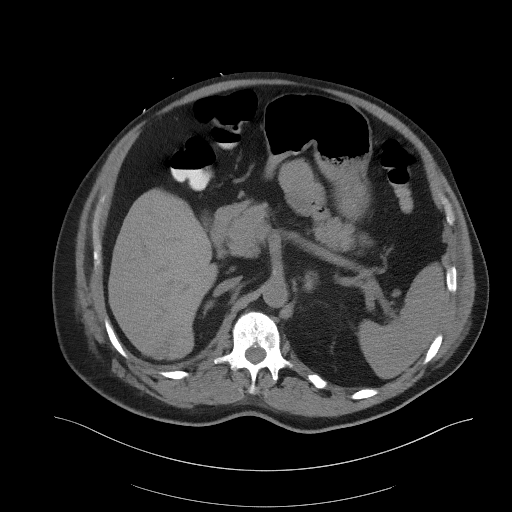
[im 77/96  soft-tissue]
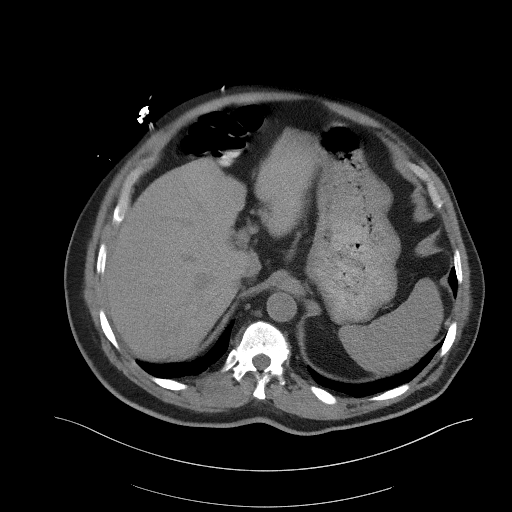
[im 84/96  soft-tissue]
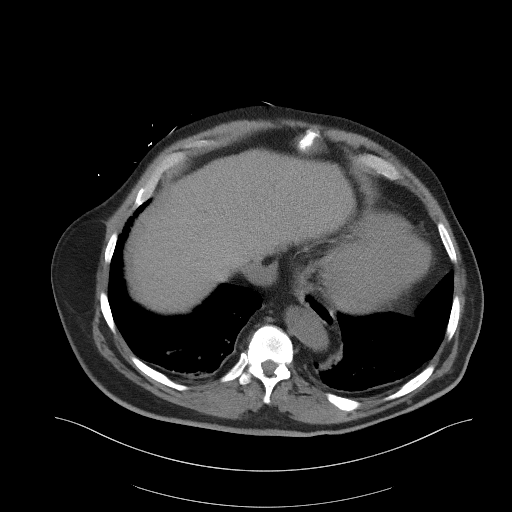
[im 92/96  soft-tissue]
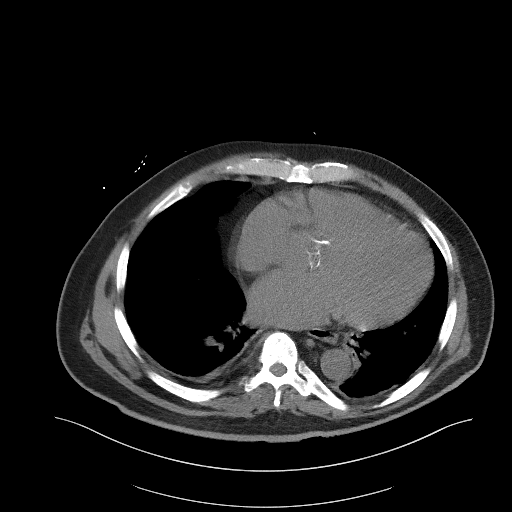

[Series 5: coronal st · coronal · 0.93mm/px · 3 of 143 slices shown]
[im 48/143  soft-tissue]
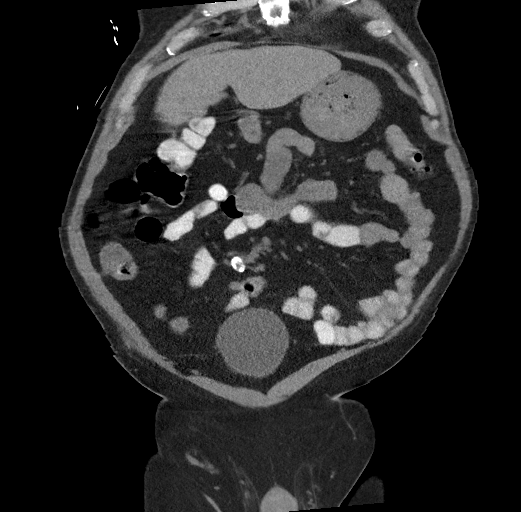
[im 64/143  soft-tissue]
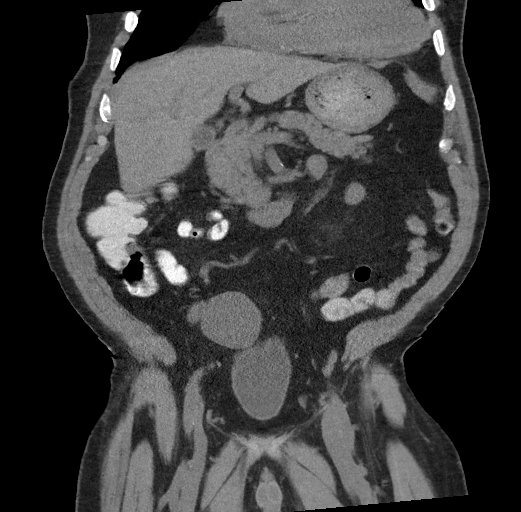
[im 79/143  soft-tissue]
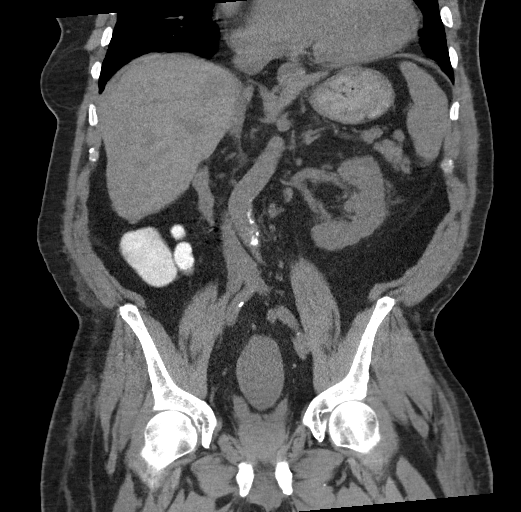

[16 of 46 positions shown; findings below may reference images not displayed]

FINDINGS: Lower chest: Bibasilar atelectasis. RIGHT lower lobe bilobed nodule
12 x 9 mm, previously 11 x 8 mm on 07/20/2016 and 11 x 9 mm on
12/06/2014.

Hepatobiliary: Gallbladder and liver normal appearance

Pancreas: Normal appearance

Spleen: Normal appearance

Adrenals/Urinary Tract: Adrenal glands normal appearance. Post RIGHT
nephrectomy. Multiple LEFT renal nodules, some of which are
intermediate to high attenuation including a 2.4 x 2.0 cm anterior
LEFT renal nodule image 39 previously 2.2 x 2.0 cm. Low-attenuation
cyst medial LEFT kidney 2.6 x 2.2 cm image 42 previously 2.3 x
cm. No LEFT hydronephrosis or definite calculi. Remaining visualized
LEFT renal nodules appear little changed in sizes. Unremarkable LEFT
ureter and urinary bladder.

Stomach/Bowel: Normal appendix. Fluid fills sigmoid colon and
rectum. Stomach and bowel loops otherwise normal appearance.

Vascular/Lymphatic: Atherosclerotic calcifications aorta, iliac
arteries and coronary arteries. Curvilinear calcification within the
mesenteries image 49 11 x 10 mm question calcified mesenteric artery
aneurysm, less likely calcified lymph node or mass, unchanged. No
adenopathy. Enlarged heart. Aneurysmal dilatation of the aortic root
5.0 cm transverse image 1.

Reproductive: Unremarkable prostate gland and seminal vesicles

Other: No free air or free fluid. Small LEFT inguinal and umbilical
hernias containing fat.

Musculoskeletal: Degenerative disc and facet disease changes at
lower lumbar spine.
IMPRESSION: Enlargement of cardiac silhouette with scattered atherosclerotic and
coronary trial calcifications as well as aneurysmal dilatation of
the aortic root 5.0 cm diameter.

Distended fluid-filled rectosigmoid colon question history of
diarrhea.

Small LEFT inguinal and umbilical hernias containing fat.

Post RIGHT nephrectomy with multiple LEFT renal nodules which appear
represent a combination of cysts and potentially hemorrhagic/high
attenuation cysts ; while the majority these lesions are little
changed from prior exam, consider either follow-up CT to demonstrate
stability or characterization by ultrasound or MR imaging to exclude
solid mass.

Question small calcified mesenteric artery aneurysm 11 x 10 mm
diameter.

Stable RIGHT lower lobe nodule.
